# Patient Record
Sex: Male | Born: 1955 | Race: Black or African American | Hispanic: No | Smoking: Former smoker
Health system: Southern US, Community
[De-identification: ages and names within clinical notes are randomized; demographics above are authoritative.]

## PROBLEM LIST (undated history)

## (undated) DIAGNOSIS — M869 Osteomyelitis, unspecified: Secondary | ICD-10-CM

## (undated) DIAGNOSIS — I10 Essential (primary) hypertension: Secondary | ICD-10-CM

## (undated) DIAGNOSIS — E119 Type 2 diabetes mellitus without complications: Secondary | ICD-10-CM

## (undated) DIAGNOSIS — M1990 Unspecified osteoarthritis, unspecified site: Secondary | ICD-10-CM

## (undated) DIAGNOSIS — I219 Acute myocardial infarction, unspecified: Secondary | ICD-10-CM

## (undated) DIAGNOSIS — I2510 Atherosclerotic heart disease of native coronary artery without angina pectoris: Secondary | ICD-10-CM

## (undated) DIAGNOSIS — Q6600 Congenital talipes equinovarus, unspecified foot: Secondary | ICD-10-CM

## (undated) DIAGNOSIS — E785 Hyperlipidemia, unspecified: Secondary | ICD-10-CM

## (undated) DIAGNOSIS — Q6602 Congenital talipes equinovarus, left foot: Secondary | ICD-10-CM

## (undated) DIAGNOSIS — Q6601 Congenital talipes equinovarus, right foot: Secondary | ICD-10-CM

## (undated) DIAGNOSIS — J189 Pneumonia, unspecified organism: Secondary | ICD-10-CM

## (undated) HISTORY — PX: CLOSED REDUCTION NASAL FRACTURE: SUR256

## (undated) HISTORY — PX: POSTERIOR CERVICAL FUSION/FORAMINOTOMY: SHX5038

## (undated) HISTORY — PX: ANTERIOR CERVICAL DECOMP/DISCECTOMY FUSION: SHX1161

## (undated) HISTORY — DX: Congenital talipes equinovarus, unspecified foot: Q66.00

## (undated) HISTORY — PX: CARDIAC CATHETERIZATION: SHX172

## (undated) HISTORY — DX: Essential (primary) hypertension: I10

## (undated) HISTORY — DX: Osteomyelitis, unspecified: M86.9

## (undated) HISTORY — DX: Atherosclerotic heart disease of native coronary artery without angina pectoris: I25.10

## (undated) HISTORY — PX: FOOT SURGERY: SHX648

## (undated) HISTORY — DX: Hyperlipidemia, unspecified: E78.5

---
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
## 1989-01-07 HISTORY — PX: FOOT FRACTURE SURGERY: SHX645

## 1997-11-26 ENCOUNTER — Emergency Department (HOSPITAL_COMMUNITY): Admission: EM | Admit: 1997-11-26 | Discharge: 1997-11-26 | Payer: Self-pay | Admitting: Internal Medicine

## 1998-08-24 ENCOUNTER — Emergency Department (HOSPITAL_COMMUNITY): Admission: EM | Admit: 1998-08-24 | Discharge: 1998-08-24 | Payer: Self-pay | Admitting: Internal Medicine

## 1999-01-08 DIAGNOSIS — J189 Pneumonia, unspecified organism: Secondary | ICD-10-CM

## 1999-01-08 HISTORY — DX: Pneumonia, unspecified organism: J18.9

## 1999-06-09 ENCOUNTER — Encounter (INDEPENDENT_AMBULATORY_CARE_PROVIDER_SITE_OTHER): Payer: Self-pay | Admitting: Specialist

## 1999-06-09 ENCOUNTER — Ambulatory Visit (HOSPITAL_COMMUNITY): Admission: RE | Admit: 1999-06-09 | Discharge: 1999-06-09 | Payer: Self-pay | Admitting: Gastroenterology

## 1999-06-25 ENCOUNTER — Emergency Department (HOSPITAL_COMMUNITY): Admission: EM | Admit: 1999-06-25 | Discharge: 1999-06-25 | Payer: Self-pay | Admitting: *Deleted

## 2000-02-28 ENCOUNTER — Inpatient Hospital Stay (HOSPITAL_COMMUNITY): Admission: EM | Admit: 2000-02-28 | Discharge: 2000-03-01 | Payer: Self-pay

## 2000-02-28 ENCOUNTER — Encounter: Payer: Self-pay | Admitting: Emergency Medicine

## 2000-03-01 ENCOUNTER — Encounter: Payer: Self-pay | Admitting: Internal Medicine

## 2001-01-31 ENCOUNTER — Encounter: Payer: Self-pay | Admitting: Emergency Medicine

## 2001-01-31 ENCOUNTER — Emergency Department (HOSPITAL_COMMUNITY): Admission: EM | Admit: 2001-01-31 | Discharge: 2001-01-31 | Payer: Self-pay | Admitting: Emergency Medicine

## 2001-03-06 ENCOUNTER — Encounter: Payer: Self-pay | Admitting: Emergency Medicine

## 2001-03-06 ENCOUNTER — Emergency Department (HOSPITAL_COMMUNITY): Admission: EM | Admit: 2001-03-06 | Discharge: 2001-03-06 | Payer: Self-pay | Admitting: Emergency Medicine

## 2001-07-22 ENCOUNTER — Emergency Department (HOSPITAL_COMMUNITY): Admission: EM | Admit: 2001-07-22 | Discharge: 2001-07-22 | Payer: Self-pay | Admitting: Emergency Medicine

## 2001-08-03 ENCOUNTER — Emergency Department (HOSPITAL_COMMUNITY): Admission: EM | Admit: 2001-08-03 | Discharge: 2001-08-03 | Payer: Self-pay | Admitting: Emergency Medicine

## 2001-08-03 ENCOUNTER — Encounter: Payer: Self-pay | Admitting: Emergency Medicine

## 2001-08-14 ENCOUNTER — Emergency Department (HOSPITAL_COMMUNITY): Admission: EM | Admit: 2001-08-14 | Discharge: 2001-08-14 | Payer: Self-pay | Admitting: Emergency Medicine

## 2001-08-14 ENCOUNTER — Encounter: Payer: Self-pay | Admitting: Emergency Medicine

## 2001-10-02 ENCOUNTER — Encounter: Admission: RE | Admit: 2001-10-02 | Discharge: 2001-12-31 | Payer: Self-pay

## 2001-11-23 ENCOUNTER — Ambulatory Visit (HOSPITAL_COMMUNITY): Admission: RE | Admit: 2001-11-23 | Discharge: 2001-11-23 | Payer: Self-pay | Admitting: Neurosurgery

## 2001-11-29 ENCOUNTER — Observation Stay (HOSPITAL_COMMUNITY): Admission: RE | Admit: 2001-11-29 | Discharge: 2001-11-30 | Payer: Self-pay | Admitting: Neurosurgery

## 2002-03-14 ENCOUNTER — Encounter: Payer: Self-pay | Admitting: Emergency Medicine

## 2002-03-15 ENCOUNTER — Inpatient Hospital Stay (HOSPITAL_COMMUNITY): Admission: EM | Admit: 2002-03-15 | Discharge: 2002-03-18 | Payer: Self-pay | Admitting: Emergency Medicine

## 2002-03-16 ENCOUNTER — Encounter: Payer: Self-pay | Admitting: Gastroenterology

## 2002-04-08 ENCOUNTER — Emergency Department (HOSPITAL_COMMUNITY): Admission: EM | Admit: 2002-04-08 | Discharge: 2002-04-08 | Payer: Self-pay | Admitting: Emergency Medicine

## 2002-04-08 ENCOUNTER — Encounter: Payer: Self-pay | Admitting: Emergency Medicine

## 2002-04-26 ENCOUNTER — Encounter (INDEPENDENT_AMBULATORY_CARE_PROVIDER_SITE_OTHER): Payer: Self-pay | Admitting: Specialist

## 2002-04-26 ENCOUNTER — Ambulatory Visit (HOSPITAL_COMMUNITY): Admission: RE | Admit: 2002-04-26 | Discharge: 2002-04-26 | Payer: Self-pay | Admitting: Gastroenterology

## 2002-05-20 ENCOUNTER — Encounter: Payer: Self-pay | Admitting: Emergency Medicine

## 2002-05-20 ENCOUNTER — Emergency Department (HOSPITAL_COMMUNITY): Admission: EM | Admit: 2002-05-20 | Discharge: 2002-05-20 | Payer: Self-pay | Admitting: Emergency Medicine

## 2002-09-18 ENCOUNTER — Emergency Department (HOSPITAL_COMMUNITY): Admission: EM | Admit: 2002-09-18 | Discharge: 2002-09-18 | Payer: Self-pay | Admitting: Emergency Medicine

## 2002-10-08 ENCOUNTER — Encounter: Payer: Self-pay | Admitting: Emergency Medicine

## 2002-10-08 ENCOUNTER — Emergency Department (HOSPITAL_COMMUNITY): Admission: EM | Admit: 2002-10-08 | Discharge: 2002-10-08 | Payer: Self-pay

## 2002-11-27 ENCOUNTER — Encounter: Payer: Self-pay | Admitting: Emergency Medicine

## 2002-11-27 ENCOUNTER — Emergency Department (HOSPITAL_COMMUNITY): Admission: EM | Admit: 2002-11-27 | Discharge: 2002-11-27 | Payer: Self-pay | Admitting: Emergency Medicine

## 2003-02-03 ENCOUNTER — Emergency Department (HOSPITAL_COMMUNITY): Admission: EM | Admit: 2003-02-03 | Discharge: 2003-02-03 | Payer: Self-pay | Admitting: Emergency Medicine

## 2003-02-04 ENCOUNTER — Emergency Department (HOSPITAL_COMMUNITY): Admission: EM | Admit: 2003-02-04 | Discharge: 2003-02-04 | Payer: Self-pay | Admitting: Emergency Medicine

## 2003-02-06 ENCOUNTER — Encounter: Admission: RE | Admit: 2003-02-06 | Discharge: 2003-02-06 | Payer: Self-pay | Admitting: Orthopaedic Surgery

## 2003-02-06 ENCOUNTER — Encounter: Payer: Self-pay | Admitting: Orthopaedic Surgery

## 2003-04-09 ENCOUNTER — Inpatient Hospital Stay (HOSPITAL_COMMUNITY): Admission: RE | Admit: 2003-04-09 | Discharge: 2003-04-12 | Payer: Self-pay | Admitting: Orthopaedic Surgery

## 2003-07-27 ENCOUNTER — Emergency Department (HOSPITAL_COMMUNITY): Admission: EM | Admit: 2003-07-27 | Discharge: 2003-07-27 | Payer: Self-pay | Admitting: Emergency Medicine

## 2003-08-06 ENCOUNTER — Encounter: Admission: RE | Admit: 2003-08-06 | Discharge: 2003-08-06 | Payer: Self-pay | Admitting: Internal Medicine

## 2004-01-11 ENCOUNTER — Emergency Department (HOSPITAL_COMMUNITY): Admission: EM | Admit: 2004-01-11 | Discharge: 2004-01-11 | Payer: Self-pay | Admitting: Emergency Medicine

## 2004-03-18 ENCOUNTER — Emergency Department (HOSPITAL_COMMUNITY): Admission: EM | Admit: 2004-03-18 | Discharge: 2004-03-18 | Payer: Self-pay | Admitting: Family Medicine

## 2004-04-04 ENCOUNTER — Emergency Department (HOSPITAL_COMMUNITY): Admission: EM | Admit: 2004-04-04 | Discharge: 2004-04-04 | Payer: Self-pay | Admitting: Family Medicine

## 2004-04-19 ENCOUNTER — Emergency Department (HOSPITAL_COMMUNITY): Admission: EM | Admit: 2004-04-19 | Discharge: 2004-04-19 | Payer: Self-pay | Admitting: Family Medicine

## 2004-04-23 ENCOUNTER — Ambulatory Visit (HOSPITAL_COMMUNITY): Admission: RE | Admit: 2004-04-23 | Discharge: 2004-04-23 | Payer: Self-pay | Admitting: Orthopedic Surgery

## 2004-05-05 ENCOUNTER — Encounter: Payer: Self-pay | Admitting: Internal Medicine

## 2004-05-16 ENCOUNTER — Emergency Department (HOSPITAL_COMMUNITY): Admission: EM | Admit: 2004-05-16 | Discharge: 2004-05-16 | Payer: Self-pay | Admitting: Emergency Medicine

## 2004-06-29 ENCOUNTER — Encounter: Admission: RE | Admit: 2004-06-29 | Discharge: 2004-09-27 | Payer: Self-pay | Admitting: Family Medicine

## 2004-07-04 ENCOUNTER — Inpatient Hospital Stay (HOSPITAL_COMMUNITY): Admission: AD | Admit: 2004-07-04 | Discharge: 2004-07-08 | Payer: Self-pay | Admitting: Emergency Medicine

## 2004-07-07 ENCOUNTER — Encounter: Payer: Self-pay | Admitting: Internal Medicine

## 2004-07-08 ENCOUNTER — Encounter (INDEPENDENT_AMBULATORY_CARE_PROVIDER_SITE_OTHER): Payer: Self-pay | Admitting: Cardiology

## 2004-08-09 ENCOUNTER — Ambulatory Visit (HOSPITAL_COMMUNITY): Admission: RE | Admit: 2004-08-09 | Discharge: 2004-08-09 | Payer: Self-pay | Admitting: Cardiology

## 2004-08-18 ENCOUNTER — Ambulatory Visit: Payer: Self-pay | Admitting: Physical Medicine & Rehabilitation

## 2004-08-18 ENCOUNTER — Inpatient Hospital Stay (HOSPITAL_COMMUNITY): Admission: RE | Admit: 2004-08-18 | Discharge: 2004-08-21 | Payer: Self-pay | Admitting: Orthopedic Surgery

## 2004-08-22 ENCOUNTER — Emergency Department (HOSPITAL_COMMUNITY): Admission: EM | Admit: 2004-08-22 | Discharge: 2004-08-22 | Payer: Self-pay | Admitting: Emergency Medicine

## 2004-08-23 ENCOUNTER — Emergency Department (HOSPITAL_COMMUNITY): Admission: EM | Admit: 2004-08-23 | Discharge: 2004-08-23 | Payer: Self-pay | Admitting: Emergency Medicine

## 2004-12-12 ENCOUNTER — Emergency Department (HOSPITAL_COMMUNITY): Admission: EM | Admit: 2004-12-12 | Discharge: 2004-12-12 | Payer: Self-pay | Admitting: Emergency Medicine

## 2005-05-13 ENCOUNTER — Emergency Department (HOSPITAL_COMMUNITY): Admission: EM | Admit: 2005-05-13 | Discharge: 2005-05-13 | Payer: Self-pay | Admitting: Emergency Medicine

## 2005-05-14 ENCOUNTER — Emergency Department (HOSPITAL_COMMUNITY): Admission: EM | Admit: 2005-05-14 | Discharge: 2005-05-15 | Payer: Self-pay | Admitting: Emergency Medicine

## 2005-07-12 ENCOUNTER — Ambulatory Visit (HOSPITAL_COMMUNITY): Admission: RE | Admit: 2005-07-12 | Discharge: 2005-07-12 | Payer: Self-pay | Admitting: Cardiology

## 2005-07-19 ENCOUNTER — Emergency Department (HOSPITAL_COMMUNITY): Admission: EM | Admit: 2005-07-19 | Discharge: 2005-07-19 | Payer: Self-pay | Admitting: Emergency Medicine

## 2005-08-17 ENCOUNTER — Inpatient Hospital Stay (HOSPITAL_COMMUNITY): Admission: RE | Admit: 2005-08-17 | Discharge: 2005-08-18 | Payer: Self-pay | Admitting: Orthopaedic Surgery

## 2005-11-28 ENCOUNTER — Emergency Department (HOSPITAL_COMMUNITY): Admission: EM | Admit: 2005-11-28 | Discharge: 2005-11-28 | Payer: Self-pay | Admitting: Family Medicine

## 2005-12-26 ENCOUNTER — Emergency Department (HOSPITAL_COMMUNITY): Admission: EM | Admit: 2005-12-26 | Discharge: 2005-12-26 | Payer: Self-pay | Admitting: Emergency Medicine

## 2006-01-27 ENCOUNTER — Emergency Department (HOSPITAL_COMMUNITY): Admission: EM | Admit: 2006-01-27 | Discharge: 2006-01-27 | Payer: Self-pay | Admitting: Family Medicine

## 2006-01-29 ENCOUNTER — Emergency Department (HOSPITAL_COMMUNITY): Admission: EM | Admit: 2006-01-29 | Discharge: 2006-01-29 | Payer: Self-pay | Admitting: Emergency Medicine

## 2006-01-30 ENCOUNTER — Emergency Department (HOSPITAL_COMMUNITY): Admission: EM | Admit: 2006-01-30 | Discharge: 2006-01-30 | Payer: Self-pay | Admitting: Emergency Medicine

## 2006-02-22 ENCOUNTER — Inpatient Hospital Stay (HOSPITAL_COMMUNITY): Admission: RE | Admit: 2006-02-22 | Discharge: 2006-02-25 | Payer: Self-pay | Admitting: Orthopedic Surgery

## 2006-04-04 ENCOUNTER — Encounter: Payer: Self-pay | Admitting: Emergency Medicine

## 2006-04-05 ENCOUNTER — Inpatient Hospital Stay (HOSPITAL_COMMUNITY): Admission: EM | Admit: 2006-04-05 | Discharge: 2006-04-17 | Payer: Self-pay | Admitting: Orthopedic Surgery

## 2006-04-25 ENCOUNTER — Ambulatory Visit: Payer: Self-pay | Admitting: Internal Medicine

## 2006-05-04 ENCOUNTER — Ambulatory Visit: Payer: Self-pay | Admitting: *Deleted

## 2006-06-02 ENCOUNTER — Ambulatory Visit: Payer: Self-pay | Admitting: Internal Medicine

## 2006-06-28 ENCOUNTER — Emergency Department (HOSPITAL_COMMUNITY): Admission: EM | Admit: 2006-06-28 | Discharge: 2006-06-28 | Payer: Self-pay | Admitting: Emergency Medicine

## 2006-07-12 ENCOUNTER — Ambulatory Visit: Payer: Self-pay | Admitting: Internal Medicine

## 2006-07-13 ENCOUNTER — Inpatient Hospital Stay (HOSPITAL_COMMUNITY): Admission: RE | Admit: 2006-07-13 | Discharge: 2006-07-18 | Payer: Self-pay | Admitting: Orthopedic Surgery

## 2006-07-19 ENCOUNTER — Emergency Department (HOSPITAL_COMMUNITY): Admission: EM | Admit: 2006-07-19 | Discharge: 2006-07-19 | Payer: Self-pay | Admitting: Emergency Medicine

## 2006-07-25 ENCOUNTER — Inpatient Hospital Stay (HOSPITAL_COMMUNITY): Admission: RE | Admit: 2006-07-25 | Discharge: 2006-07-28 | Payer: Self-pay | Admitting: Orthopedic Surgery

## 2006-08-23 ENCOUNTER — Ambulatory Visit: Payer: Self-pay | Admitting: Internal Medicine

## 2006-08-25 ENCOUNTER — Ambulatory Visit (HOSPITAL_COMMUNITY): Admission: RE | Admit: 2006-08-25 | Discharge: 2006-08-26 | Payer: Self-pay | Admitting: Orthopedic Surgery

## 2006-10-05 ENCOUNTER — Inpatient Hospital Stay (HOSPITAL_COMMUNITY): Admission: AD | Admit: 2006-10-05 | Discharge: 2006-10-09 | Payer: Self-pay | Admitting: Orthopedic Surgery

## 2006-11-09 ENCOUNTER — Ambulatory Visit (HOSPITAL_COMMUNITY): Admission: RE | Admit: 2006-11-09 | Discharge: 2006-11-10 | Payer: Self-pay | Admitting: Orthopedic Surgery

## 2006-11-19 ENCOUNTER — Emergency Department (HOSPITAL_COMMUNITY): Admission: EM | Admit: 2006-11-19 | Discharge: 2006-11-19 | Payer: Self-pay | Admitting: *Deleted

## 2007-01-05 ENCOUNTER — Emergency Department (HOSPITAL_COMMUNITY): Admission: EM | Admit: 2007-01-05 | Discharge: 2007-01-05 | Payer: Self-pay | Admitting: Emergency Medicine

## 2007-01-10 ENCOUNTER — Inpatient Hospital Stay (HOSPITAL_COMMUNITY): Admission: RE | Admit: 2007-01-10 | Discharge: 2007-01-14 | Payer: Self-pay | Admitting: Orthopedic Surgery

## 2007-01-24 ENCOUNTER — Encounter (INDEPENDENT_AMBULATORY_CARE_PROVIDER_SITE_OTHER): Payer: Self-pay | Admitting: *Deleted

## 2007-02-21 ENCOUNTER — Ambulatory Visit (HOSPITAL_COMMUNITY): Admission: RE | Admit: 2007-02-21 | Discharge: 2007-02-22 | Payer: Self-pay | Admitting: Orthopedic Surgery

## 2007-03-16 ENCOUNTER — Ambulatory Visit: Payer: Self-pay | Admitting: Internal Medicine

## 2007-03-17 ENCOUNTER — Encounter (INDEPENDENT_AMBULATORY_CARE_PROVIDER_SITE_OTHER): Payer: Self-pay | Admitting: Internal Medicine

## 2007-03-24 ENCOUNTER — Emergency Department (HOSPITAL_COMMUNITY): Admission: EM | Admit: 2007-03-24 | Discharge: 2007-03-24 | Payer: Self-pay | Admitting: Family Medicine

## 2007-03-27 ENCOUNTER — Emergency Department (HOSPITAL_COMMUNITY): Admission: EM | Admit: 2007-03-27 | Discharge: 2007-03-27 | Payer: Self-pay | Admitting: Emergency Medicine

## 2007-03-29 ENCOUNTER — Ambulatory Visit (HOSPITAL_COMMUNITY): Admission: RE | Admit: 2007-03-29 | Discharge: 2007-03-30 | Payer: Self-pay | Admitting: Orthopedic Surgery

## 2007-05-17 ENCOUNTER — Ambulatory Visit (HOSPITAL_COMMUNITY): Admission: RE | Admit: 2007-05-17 | Discharge: 2007-05-18 | Payer: Self-pay | Admitting: Orthopedic Surgery

## 2007-05-24 ENCOUNTER — Encounter: Admission: RE | Admit: 2007-05-24 | Discharge: 2007-05-24 | Payer: Self-pay | Admitting: Orthopaedic Surgery

## 2007-06-07 ENCOUNTER — Encounter: Admission: RE | Admit: 2007-06-07 | Discharge: 2007-06-07 | Payer: Self-pay | Admitting: Orthopaedic Surgery

## 2007-06-27 ENCOUNTER — Encounter: Payer: Self-pay | Admitting: Internal Medicine

## 2007-07-04 ENCOUNTER — Ambulatory Visit: Payer: Self-pay | Admitting: Internal Medicine

## 2007-07-17 ENCOUNTER — Ambulatory Visit (HOSPITAL_COMMUNITY): Admission: RE | Admit: 2007-07-17 | Discharge: 2007-07-18 | Payer: Self-pay | Admitting: Orthopedic Surgery

## 2007-08-22 ENCOUNTER — Ambulatory Visit (HOSPITAL_COMMUNITY): Admission: RE | Admit: 2007-08-22 | Discharge: 2007-08-23 | Payer: Self-pay | Admitting: Orthopedic Surgery

## 2007-09-20 ENCOUNTER — Ambulatory Visit: Payer: Self-pay | Admitting: Internal Medicine

## 2007-10-10 ENCOUNTER — Ambulatory Visit (HOSPITAL_COMMUNITY): Admission: RE | Admit: 2007-10-10 | Discharge: 2007-10-11 | Payer: Self-pay | Admitting: Orthopedic Surgery

## 2007-11-17 ENCOUNTER — Emergency Department (HOSPITAL_COMMUNITY): Admission: EM | Admit: 2007-11-17 | Discharge: 2007-11-17 | Payer: Self-pay | Admitting: Emergency Medicine

## 2008-09-13 ENCOUNTER — Emergency Department (HOSPITAL_COMMUNITY): Admission: EM | Admit: 2008-09-13 | Discharge: 2008-09-13 | Payer: Self-pay | Admitting: Emergency Medicine

## 2008-10-04 ENCOUNTER — Emergency Department (HOSPITAL_COMMUNITY): Admission: EM | Admit: 2008-10-04 | Discharge: 2008-10-05 | Payer: Self-pay | Admitting: Emergency Medicine

## 2008-12-11 ENCOUNTER — Ambulatory Visit (HOSPITAL_COMMUNITY): Admission: RE | Admit: 2008-12-11 | Discharge: 2008-12-12 | Payer: Self-pay | Admitting: Orthopedic Surgery

## 2008-12-28 ENCOUNTER — Emergency Department (HOSPITAL_COMMUNITY): Admission: EM | Admit: 2008-12-28 | Discharge: 2008-12-29 | Payer: Self-pay | Admitting: Emergency Medicine

## 2009-01-14 ENCOUNTER — Encounter: Payer: Self-pay | Admitting: Internal Medicine

## 2009-01-14 ENCOUNTER — Ambulatory Visit: Payer: Self-pay | Admitting: Infectious Diseases

## 2009-01-14 DIAGNOSIS — M109 Gout, unspecified: Secondary | ICD-10-CM | POA: Insufficient documentation

## 2009-01-14 DIAGNOSIS — I10 Essential (primary) hypertension: Secondary | ICD-10-CM | POA: Insufficient documentation

## 2009-01-14 DIAGNOSIS — E785 Hyperlipidemia, unspecified: Secondary | ICD-10-CM | POA: Insufficient documentation

## 2009-01-14 LAB — CONVERTED CEMR LAB
ALT: 21 units/L (ref 0–53)
Albumin: 4 g/dL (ref 3.5–5.2)
BUN: 10 mg/dL (ref 6–23)
Chloride: 107 meq/L (ref 96–112)
HDL: 34 mg/dL — ABNORMAL LOW (ref >39)
Hgb A1c MFr Bld: 6.5 %
Potassium: 4.3 meq/L (ref 3.5–5.3)
VLDL: 43 mg/dL — ABNORMAL HIGH (ref 0–40)

## 2009-01-22 ENCOUNTER — Ambulatory Visit: Payer: Self-pay | Admitting: Internal Medicine

## 2009-02-16 ENCOUNTER — Ambulatory Visit: Payer: Self-pay | Admitting: Internal Medicine

## 2009-02-16 ENCOUNTER — Encounter: Payer: Self-pay | Admitting: Internal Medicine

## 2009-02-16 DIAGNOSIS — F528 Other sexual dysfunction not due to a substance or known physiological condition: Secondary | ICD-10-CM | POA: Insufficient documentation

## 2009-02-16 LAB — CONVERTED CEMR LAB
HCT: 39 % (ref 39.0–52.0)
MCHC: 30.5 g/dL (ref 30.0–36.0)
MCV: 69.1 fL — ABNORMAL LOW (ref >=78.0)
Platelets: 312 10*3/uL (ref 150–400)
Prolactin: 9.2 ng/mL (ref 2.1–17.1)
RBC: 5.64 M/uL (ref 4.22–5.81)
Testosterone: 190.2 ng/dL — ABNORMAL LOW (ref 350–890)
WBC: 7.7 10*3/uL (ref 4.0–10.5)

## 2009-03-07 ENCOUNTER — Encounter: Payer: Self-pay | Admitting: Internal Medicine

## 2009-03-19 ENCOUNTER — Ambulatory Visit: Payer: Self-pay | Admitting: Internal Medicine

## 2009-03-19 LAB — CONVERTED CEMR LAB
AST: 22 units/L (ref 0–37)
BUN: 15 mg/dL (ref 6–23)
CO2: 20 meq/L (ref 19–32)
Glucose, Bld: 80 mg/dL (ref 70–99)
Sodium: 143 meq/L (ref 135–145)
Total Protein: 7.6 g/dL (ref 6.0–8.3)

## 2009-03-20 LAB — CONVERTED CEMR LAB
Cholesterol: 223 mg/dL — ABNORMAL HIGH (ref 0–200)
HDL: 35 mg/dL — ABNORMAL LOW (ref >39)
VLDL: 25 mg/dL (ref 0–40)

## 2009-05-09 DIAGNOSIS — E119 Type 2 diabetes mellitus without complications: Secondary | ICD-10-CM

## 2009-05-09 HISTORY — DX: Type 2 diabetes mellitus without complications: E11.9

## 2009-06-14 ENCOUNTER — Emergency Department (HOSPITAL_COMMUNITY): Admission: EM | Admit: 2009-06-14 | Discharge: 2009-06-14 | Payer: Self-pay | Admitting: Family Medicine

## 2009-07-07 HISTORY — PX: CARDIAC CATHETERIZATION: SHX172

## 2009-07-21 ENCOUNTER — Ambulatory Visit: Payer: Self-pay | Admitting: Infectious Diseases

## 2009-07-21 ENCOUNTER — Encounter: Payer: Self-pay | Admitting: Licensed Clinical Social Worker

## 2009-07-21 ENCOUNTER — Telehealth: Payer: Self-pay | Admitting: Licensed Clinical Social Worker

## 2009-07-31 ENCOUNTER — Inpatient Hospital Stay (HOSPITAL_COMMUNITY): Admission: AD | Admit: 2009-07-31 | Discharge: 2009-08-05 | Payer: Self-pay | Admitting: Internal Medicine

## 2009-07-31 ENCOUNTER — Ambulatory Visit: Payer: Self-pay | Admitting: Internal Medicine

## 2009-07-31 ENCOUNTER — Encounter: Payer: Self-pay | Admitting: Internal Medicine

## 2009-08-05 LAB — PULMONARY FUNCTION TEST

## 2009-08-10 ENCOUNTER — Ambulatory Visit: Payer: Self-pay | Admitting: Internal Medicine

## 2009-08-10 LAB — CONVERTED CEMR LAB

## 2009-09-06 DIAGNOSIS — E119 Type 2 diabetes mellitus without complications: Secondary | ICD-10-CM

## 2009-09-06 DIAGNOSIS — E1165 Type 2 diabetes mellitus with hyperglycemia: Secondary | ICD-10-CM | POA: Insufficient documentation

## 2009-09-06 DIAGNOSIS — E0865 Diabetes mellitus due to underlying condition with hyperglycemia: Secondary | ICD-10-CM | POA: Insufficient documentation

## 2009-11-24 ENCOUNTER — Encounter: Payer: Self-pay | Admitting: Internal Medicine

## 2009-11-30 ENCOUNTER — Ambulatory Visit: Payer: Self-pay | Admitting: Internal Medicine

## 2009-11-30 LAB — CONVERTED CEMR LAB: Hgb A1c MFr Bld: 6.8 %

## 2009-12-01 LAB — CONVERTED CEMR LAB
ALT: 20 units/L (ref 0–53)
Albumin: 4 g/dL (ref 3.5–5.2)
CO2: 23 meq/L (ref 19–32)
Calcium: 9 mg/dL (ref 8.4–10.5)
Chloride: 105 meq/L (ref 96–112)
Cholesterol: 216 mg/dL — ABNORMAL HIGH (ref 0–200)
Creatinine, Ser: 0.97 mg/dL (ref 0.40–1.50)
Microalb Creat Ratio: 3.6 mg/g (ref 0.0–30.0)
Potassium: 4.4 meq/L (ref 3.5–5.3)
Total CHOL/HDL Ratio: 6.8
Total Protein: 7.1 g/dL (ref 6.0–8.3)

## 2009-12-07 ENCOUNTER — Ambulatory Visit: Payer: Self-pay | Admitting: Internal Medicine

## 2009-12-07 ENCOUNTER — Telehealth: Payer: Self-pay | Admitting: Internal Medicine

## 2009-12-07 LAB — CONVERTED CEMR LAB
Blood Glucose, Fingerstick: 110
CO2: 24 meq/L (ref 19–32)
Chloride: 103 meq/L (ref 96–112)
Glucose, Bld: 99 mg/dL (ref 70–99)
Potassium: 4.6 meq/L (ref 3.5–5.3)
Sodium: 139 meq/L (ref 135–145)

## 2009-12-30 ENCOUNTER — Emergency Department (HOSPITAL_COMMUNITY): Admission: EM | Admit: 2009-12-30 | Discharge: 2009-12-30 | Payer: Self-pay | Admitting: Emergency Medicine

## 2010-02-24 ENCOUNTER — Ambulatory Visit: Payer: Self-pay | Admitting: Internal Medicine

## 2010-04-08 ENCOUNTER — Emergency Department (HOSPITAL_COMMUNITY)
Admission: EM | Admit: 2010-04-08 | Discharge: 2010-04-08 | Payer: Self-pay | Source: Home / Self Care | Admitting: Family Medicine

## 2010-04-26 ENCOUNTER — Telehealth (INDEPENDENT_AMBULATORY_CARE_PROVIDER_SITE_OTHER): Payer: Self-pay | Admitting: *Deleted

## 2010-04-26 ENCOUNTER — Ambulatory Visit: Payer: Self-pay | Admitting: Internal Medicine

## 2010-04-28 ENCOUNTER — Encounter: Payer: Self-pay | Admitting: Internal Medicine

## 2010-04-28 ENCOUNTER — Ambulatory Visit (HOSPITAL_COMMUNITY)
Admission: RE | Admit: 2010-04-28 | Discharge: 2010-04-28 | Payer: Self-pay | Source: Home / Self Care | Attending: Internal Medicine | Admitting: Internal Medicine

## 2010-05-30 ENCOUNTER — Encounter: Payer: Self-pay | Admitting: Cardiology

## 2010-06-08 NOTE — Assessment & Plan Note (Signed)
Summary: EST-3 MONTH CHECKUP/CH   Vital Signs:  Patient profile:   55 year old male Height:      66 inches (167.64 cm) Weight:      225.5 pounds (102.50 kg) BMI:     36.53 Temp:     97.8 degrees F (36.56 degrees C) oral Pulse rate:   83 / minute BP sitting:   167 / 96  (left arm) Cuff size:   large  Vitals Entered By: Cynda Familia Duncan Dull) (November 30, 2009 8:57 AM) CC: f/u,  med refill Is Patient Diabetic? Yes Did you bring your meter with you today? No Pain Assessment Patient in pain? no      Nutritional Status BMI of > 30 = obese  Have you ever been in a relationship where you felt threatened, hurt or afraid?No   Does patient need assistance? Functional Status Self care Ambulation Normal   Primary Care Provider:  Lars Mage MD  CC:  f/u and med refill.  History of Present Illness: 55 yo male with HLD, HTN, CAD (NSTEMI in 2006), newly diagnosed diabetes with Hba1c 7.2, recurrent osteomyelitis presents to [facility] for regular follow up.  He was recently admitted for SOB and chest pain. His cardiac cath showed nonobstructive coronary disease with 20% to 30% obtuse marginal, normal left ventricular function. His chest pain was determined to be noncardiac  and possibly related to GERD.   HTN: He is out of all his meds since last 2 weeks or so.  HLD: Not taking his meds as he is out of it.  Osteomylitis: Is not taking Cipro and Doxy since he has been out of it and will see Dr Lajoyce Corners tomorrow for evaluation of his feet.  Diabetes: Patient's last HBa1c was 7.2 while hospitalization, his fasting BG today is 121 and [name] tells me that Medicare does not accept Hba1c as diagnositic criteria for Diabetes. I will get an Optha exam done and will change his metformin to 1000 two times a day. Get UA for microalb/crt ratio.   He denies any new sicknesses or hospitalizations, no chest pain episodes, no fevers, no chills, no abdominal or urinary concerns. No recent changes  in appetite, weight.   Preventive Screening-Counseling & Management  Alcohol-Tobacco     Alcohol drinks/day: 0     Smoking Status: quit     Year Quit: 2008  Problems Prior to Update: 1)  Dyspnea  (ICD-786.05) 2)  Diabetes Mellitus, Borderline  (ICD-790.29) 3)  Coronary Artery Disease  (ICD-414.00) 4)  Erectile Dysfunction, Non-organic  (ICD-302.72) 5)  Leg Pain, Left  (ICD-729.5) 6)  Hypertension, Benign Essential  (ICD-401.1) 7)  Hyperlipidemia  (ICD-272.4) 8)  Osteomyelitis, Chronic, Unspecified Site  (ICD-730.10) 9)  Gout  (ICD-274.9)  Medications Prior to Update: 1)  Pravastatin Sodium 40 Mg Tabs (Pravastatin Sodium) .... Take 1 Tablet By Mouth Once A Day 2)  Lisinopril-Hydrochlorothiazide 20-25 Mg Tabs (Lisinopril-Hydrochlorothiazide) .... Take 1 Tablet By Mouth Once A Day 3)  Doxycycline Hyclate 100 Mg Tabs (Doxycycline Hyclate) .... Take 1 Tablet By Mouth Once A Day 4)  Ciprofloxacin Hcl 500 Mg Tabs (Ciprofloxacin Hcl) .... Take 1 Tablet By Mouth Two Times A Day 5)  Aspirin 81 Mg Chew (Aspirin) .... Take 1 Tablet By Mouth Once A Day 6)  Ativan 0.5 Mg Tabs (Lorazepam) .... Take 1 Tablet Once Daily As Needed For Anxiety 7)  Metformin Hcl 500 Mg Tabs (Metformin Hcl) .... Take 1 Tablet By Mouth Once A Day 8)  Antivert 25 Mg Tabs (Meclizine Hcl) .... Take 2 Tablets By Mouth Every Day  Current Medications (verified): 1)  Pravastatin Sodium 40 Mg Tabs (Pravastatin Sodium) .... Take 1 Tablet By Mouth Once A Day 2)  Lisinopril-Hydrochlorothiazide 20-25 Mg Tabs (Lisinopril-Hydrochlorothiazide) .... Take 1 Tablet By Mouth Once A Day 3)  Aspirin 81 Mg Chew (Aspirin) .... Take 1 Tablet By Mouth Once A Day 4)  Ativan 0.5 Mg Tabs (Lorazepam) .... Take 1 Tablet Once Daily As Needed For Anxiety 5)  Metformin Hcl 1000 Mg Tabs (Metformin Hcl) .... Take 1 Tablet By Mouth Two Times A Day  Allergies: 1)  ! Morphine  Past History:  Past Medical History: Last updated: 01/14/2009 Coronary  artery disease Gout Hyperlipidemia Hypertension osteomylitis CTEV(club foot)  Family History: Last updated: 01/14/2009 mother20 year old with diabetes and CAD with pacemaker.  Social History: Last updated: 01/22/2009 Former Smoker Alcohol use-yes Regular exercise-no  Risk Factors: Alcohol Use: 0 (11/30/2009) Exercise: no (08/10/2009)  Risk Factors: Smoking Status: quit (11/30/2009)  Family History: Reviewed history from 01/14/2009 and no changes required. mother20 year old with diabetes and CAD with pacemaker.  Social History: Reviewed history from 01/22/2009 and no changes required. Former Smoker Alcohol use-yes Regular exercise-no  Review of Systems      See HPI  Physical Exam  Additional Exam:  Gen: AOx3, in no acute distress Eyes: PERRL, EOMI ENT:MMM, No erythema noted in posterior pharynx Neck: No JVD, No LAP Chest: CTAB with  good respiratory effort CVS: regular rhythmic rate, NO M/R/G, S1 S2 normal Abdo: soft,ND, BS+x4, Non tender and No hepatosplenomegaly EXT: No odema noted, left foot has small healing ulcer on the planter aspect which is healing Neuro: Non focal, gait is normal Skin: no rashes noted.    Impression & Recommendations:  Problem # 1:  DIAB W/O COMP TYPE II/UNS NOT STATED UNCNTRL (ICD-250.00) Assessment New  Optha referral. Metformin 1000 two times a day  Weight loss and diet managment>referral to [employer] and pamphlet to lifestyle class. Already on Lisinopril but will check Urine microalb ratio. Foot exam: sensations to light touch +, pulses diminished.  His updated medication list for this problem includes:    Lisinopril-hydrochlorothiazide 20-25 Mg Tabs (Lisinopril-hydrochlorothiazide) .Marland Kitchen... Take 1 tablet by mouth once a day    Aspirin 81 Mg Chew (Aspirin) .Marland Kitchen... Take 1 tablet by mouth once a day    Metformin Hcl 1000 Mg Tabs (Metformin hcl) .Marland Kitchen... Take 1 tablet by mouth two times a day  Orders: Ophthalmology Referral  (Ophthalmology) T-Urine Microalbumin w/creat. ratio [phone]) Nutrition Referral (Nutrition)  Labs Reviewed: Creat: 1.17 (03/19/2009)    Reviewed HgBA1c results: 6.8 (11/30/2009)  6.5 (01/14/2009)  Problem # 2:  HYPERTENSION, BENIGN ESSENTIAL (ICD-401.1) Assessment: Deteriorated Not taking Anti HTN since >10 days. Will restart today.  His updated medication list for this problem includes:    Lisinopril-hydrochlorothiazide 20-25 Mg Tabs (Lisinopril-hydrochlorothiazide) .Marland Kitchen... Take 1 tablet by mouth once a day  BP today: 167/96 Prior BP: 127/80 (08/10/2009)  Labs Reviewed: K+: 4.4 (03/19/2009) Creat: : 1.17 (03/19/2009)   Chol: 223 (03/19/2009)   HDL: 35 (03/19/2009)   LDL: 163 (03/19/2009)   TG: 127 (03/19/2009)  Problem # 3:  CORONARY ARTERY DISEASE (ICD-414.00) Assessment: Improved No chest pain/Shortness of breath reported. His updated medication list for this problem includes:    Lisinopril-hydrochlorothiazide 20-25 Mg Tabs (Lisinopril-hydrochlorothiazide) .Marland Kitchen... Take 1 tablet by mouth once a day    Aspirin 81 Mg Chew (Aspirin) .Marland Kitchen... Take 1 tablet by mouth once  a day  Labs Reviewed: Chol: 223 (03/19/2009)   HDL: 35 (03/19/2009)   LDL: 163 (03/19/2009)   TG: 127 (03/19/2009)  Problem # 4:  HYPERLIPIDEMIA (ICD-272.4) Assessment: Unchanged Check his Lipid panel today.  His updated medication list for this problem includes:    Pravastatin Sodium 40 Mg Tabs (Pravastatin sodium) .Marland Kitchen... Take 1 tablet by mouth once a day  Orders: T-Lipid Profile [phone])  Labs Reviewed: SGOT: 22 (03/19/2009)   SGPT: 21 (03/19/2009)   HDL:35 (03/19/2009), 34 (01/14/2009)  LDL:163 (03/19/2009), 141 (01/14/2009)  Chol:223 (03/19/2009), 218 (01/14/2009)  Trig:127 (03/19/2009), 213 (01/14/2009)  Problem # 5:  OSTEOMYELITIS, CHRONIC, UNSPECIFIED SITE (ICD-730.10) Assessment: Improved Patient to see Dr Lajoyce Corners tomorrow. He has been off his meds for last 2 weeks now and the wound does not  look infected at this time. I will follow up on Dr Audrie Lia note and plan in this regard as to how to manage his osteolylitis from here onwards.  Problem # 6:  Preventive Health Care (ICD-V70.0) Assessment: Comment Only Pneumovax given today.  Reviewed preventive care protocols, scheduled due services, and updated immunizations.  Complete Medication List: 1)  Pravastatin Sodium 40 Mg Tabs (Pravastatin sodium) .... Take 1 tablet by mouth once a day 2)  Lisinopril-hydrochlorothiazide 20-25 Mg Tabs (Lisinopril-hydrochlorothiazide) .... Take 1 tablet by mouth once a day 3)  Aspirin 81 Mg Chew (Aspirin) .... Take 1 tablet by mouth once a day 4)  Ativan 0.5 Mg Tabs (Lorazepam) .... Take 1 tablet once daily as needed for anxiety 5)  Metformin Hcl 1000 Mg Tabs (Metformin hcl) .... Take 1 tablet by mouth two times a day  Other Orders: T-Hgb A1C (in-house) [id number]) T- Capillary Blood Glucose (47829) T-Comprehensive Metabolic Panel ([id number]) Pneumococcal Vaccine (84696) Admin 1st Vaccine (29528)  Patient Instructions: 1)  Please schedule a follow-up appointment in 3 months. 2)  Limit your Sodium (Salt). 3)  You need to lose weight. Consider a lower calorie diet and regular exercise.  4)  Take an Aspirin every day. 5)  Check your blood sugars regularly. If your readings are usually above :200 or below 70 you should contact our office. 6)  It is important that your Diabetic A1c level is checked every 3 months. 7)  See your eye doctor yearly to check for diabetic eye damage. 8)  Check your feet each night for sore areas, calluses or signs of infection. 9)  Check your Blood Pressure regularly. If it is above: 160/100 you should make an appointment. Prescriptions: ATIVAN 0.5 MG TABS (LORAZEPAM) take 1 tablet once daily as needed for anxiety  #30 x 0   Entered and Authorized by:   Lars Mage MD   Signed by:   Lars Mage MD on 11/30/2009   Method used:   Print then Give to Patient   RxID:    [id number] ASPIRIN 81 MG CHEW (ASPIRIN) Take 1 tablet by mouth once a day  #30 x 11   Entered and Authorized by:   Lars Mage MD   Signed by:   Lars Mage MD on 11/30/2009   Method used:   Print then Give to Patient   RxID:   [id number] LISINOPRIL-HYDROCHLOROTHIAZIDE 20-25 MG TABS (LISINOPRIL-HYDROCHLOROTHIAZIDE) Take 1 tablet by mouth once a day  #31 x 11   Entered and Authorized by:   Lars Mage MD   Signed by:   Lars Mage MD on 11/30/2009   Method used:   Print then Give to Patient   RxID:   [id number] PRAVASTATIN  SODIUM 40 MG TABS (PRAVASTATIN SODIUM) Take 1 tablet by mouth once a day  #30 x 11   Entered and Authorized by:   Lars Mage MD   Signed by:   Lars Mage MD on 11/30/2009   Method used:   Print then Give to Patient   RxID:   [id number] METFORMIN HCL 1000 MG TABS (METFORMIN HCL) Take 1 tablet by mouth two times a day  #62 x 11   Entered and Authorized by:   Lars Mage MD   Signed by:   Lars Mage MD on 11/30/2009   Method used:   Print then Give to Patient   RxID:   [id number]  Process Orders Check Orders Results:     [facility]: Check successful Order queued for requisitioning for [facility]: November 30, 2009 11:56 AM  Tests Sent for requisitioning (November 30, 2009 11:56 AM):     11/30/2009: [facility] -- T-Urine Microalbumin w/creat. ratio [[id number]] (signed)     11/30/2009: [facility] -- T-Lipid Profile [phone] (signed)     11/30/2009: [facility] -- T-Comprehensive Metabolic Panel [phone] (signed)    Prevention & Chronic Care Immunizations   Influenza vaccine: Fluvax 3+  (02/16/2009)   Influenza vaccine deferral: Not indicated  (08/10/2009)    Tetanus booster: Not documented   Td booster deferral: Deferred  (11/30/2009)    Pneumococcal vaccine: Pneumovax  (11/30/2009)  Colorectal Screening   Hemoccult: Not documented   Hemoccult action/deferral:  Refused  (08/10/2009)    Colonoscopy: Not documented   Colonoscopy action/deferral: Deferred  (11/30/2009)  Other Screening   PSA: Not documented   PSA action/deferral: Not indicated  (08/10/2009)   Smoking status: quit  (11/30/2009)  Diabetes Mellitus   HgbA1C: 6.8  (11/30/2009)    Eye exam: Not documented   Diabetic eye exam action/deferral: Ophthalmology referral  (11/30/2009)    Foot exam: Not documented   High risk foot: Not documented   Foot care education: Not documented    Urine microalbumin/creatinine ratio: Not documented   Urine microalbumin action/deferral: Ordered    Diabetes flowsheet reviewed?: Yes   Progress toward A1C goal: Unchanged  Lipids   Total Cholesterol: 223  (03/19/2009)   Lipid panel action/deferral: Lipid Panel ordered   LDL: 163  (03/19/2009)   LDL Direct: Not documented   HDL: 35  (03/19/2009)   Triglycerides: 127  (03/19/2009)    SGOT (AST): 22  (03/19/2009)   BMP action: Ordered   SGPT (ALT): 21  (03/19/2009) CMP ordered    Alkaline phosphatase: 126  (03/19/2009)   Total bilirubin: 0.4  (03/19/2009)    Lipid flowsheet reviewed?: Yes   Progress toward LDL goal: Unchanged  Hypertension   Last Blood Pressure: 167 / 96  (11/30/2009)   Serum creatinine: 1.17  (03/19/2009)   BMP action: Ordered   Serum potassium 4.4  (03/19/2009) CMP ordered     Hypertension flowsheet reviewed?: Yes   Progress toward BP goal: Deteriorated  Self-Management Support :   Personal Goals (by the next clinic visit) :      Personal blood pressure goal: 140/90  (01/22/2009)     Personal LDL goal: 100  (01/22/2009)    Patient will work on the following items until the next clinic visit to reach self-care goals:     Medications and monitoring: take my medicines every day, check my blood pressure, bring all of my medications to every visit, weigh myself weekly  (11/30/2009)     Eating: eat more  vegetables, eat foods that are low in salt, eat baked foods  instead of fried foods  (11/30/2009)     Activity: take a 30 minute walk every day  (11/30/2009)     Other: having hard time cutting back on salt and no exercise due to multiple surgeries on left foot and gout in right foot  (07/21/2009)    Diabetes self-management support: Resources for patients handout, Written self-care plan, Referred for DM self-management training  (11/30/2009)   Diabetes care plan printed   Referred.    Hypertension self-management support: Resources for patients handout, Written self-care plan  (11/30/2009)   Hypertension self-care plan printed.    Lipid self-management support: Resources for patients handout, Written self-care plan  (11/30/2009)   Lipid self-care plan printed.      Resource handout printed.   Nursing Instructions: Refer for screening diabetic eye exam (see order) Refer for diabetes self-management training (see order)    Diabetes Self Management Training Referral Patient Name: Daniel Perkins Date Of Birth: [birth year] MRN: [id number] Current Diagnosis:  DYSPNEA (ICD-786.05) DIAB W/O COMP TYPE II/UNS NOT STATED UNCNTRL (ICD-250.00) CORONARY ARTERY DISEASE (ICD-414.00) ERECTILE DYSFUNCTION, NON-ORGANIC (ICD-302.72) LEG PAIN, LEFT (ICD-729.5) HYPERTENSION, BENIGN ESSENTIAL (ICD-401.1) HYPERLIPIDEMIA (ICD-272.4) OSTEOMYELITIS, CHRONIC, UNSPECIFIED SITE (ICD-730.10) GOUT (ICD-274.9)     Management Training Needs:   Initial Diabetes Self management Training   Initial Medical Nutrition Therapy(3 hours/1st year)  Complicating Conditions:  HTN  Dyslipidemia   Laboratory Results   Blood Tests   Date/Time Received: November 30, 2009 9:25 AM Date/Time Reported: Alric Quan  November 30, 2009 9:25 AM  HGBA1C: 6.8%   (Normal Range: Non-Diabetic - 3-6%   Control Diabetic - 6-8%) CBG Fasting:: 121mg /dL  Comments: Patient stated that he has been out of his medication for several days.  He does not use a meter to check cbg at home.  Alric Quan  November 30, 2009 9:26 AM     Process Orders Check Orders Results:     [facility]: Check successful Order queued for requisitioning for [facility]: November 30, 2009 11:56 AM  Tests Sent for requisitioning (November 30, 2009 11:56 AM):     11/30/2009: [facility] -- T-Urine Microalbumin w/creat. ratio [[id number]] (signed)     11/30/2009: [facility] -- T-Lipid Profile [phone] (signed)     11/30/2009: [facility] -- T-Comprehensive Metabolic Panel [phone] (signed)       Immunizations Administered:  Pneumonia Vaccine:    Vaccine Type: Pneumovax    Site: left deltoid    Mfr: Merck    Dose: 0.5 ml    Route: IM    Given by: Cynda Familia ([facility])    Exp. Date: 05/26/2011    Lot #: [id number]    VIS given: 12/05/95 version given November 30, 2009.  Appended Document: EST-3 MONTH CHECKUP/CH Patient's Hba1c came back and it is actually at goal. I discussed the case with Dr Meredith Pel and we agreed not to raise his Metformin at this time. I will make that change in the EMR and call the patient with this information.   Clinical Lists Changes  Medications: Changed medication from METFORMIN HCL 1000 MG TABS (METFORMIN HCL) Take 1 tablet by mouth two times a day to METFORMIN HCL 500 MG TABS (METFORMIN HCL) Take 1 tablet by mouth two times a day

## 2010-06-08 NOTE — Miscellaneous (Signed)
Summary: history and physical admission  Clinical Lists Changes  INTERNAL MEDICINE ADMISSION HISTORY AND PHYSICAL PCP: Clinic CC: Chest pain and SOB HPI: 55 yo african american past history with a history of NSTEMI in 2006, gout, hyperlipidemia, HTN and recurrent osteomyelitis presents with a 3 weeks history of SOB and chest pain. Patient states that he cannot walk more than 10-20 meters with out getting SOB. His SOB is completely relieved by rest after just one to five minutes. He denies any cough or wheeze or any fever but does endorse subjective chills. Associated with this SOB is chest pain which the patient describes as burning and pressure that goes away with of rest. He states the pain is substernal and does not radiate to the jaw or arm. He does endorse nausea with some of the episodes of chest pain. He rates the pain as an 8 out of 10 and completely relieved by rest. The pain not positional and did not come in association with aviral prodrome. He denies any unilteral leg swelling or hemoptysis or pleurisy. He denies any syncopal episodes but does endorse being lightheaded at times. He also endorses marked fatigue and generalized weakness. ROS: No weight changes CV: as per HPI no edema Resp: no cough GI: Nausea and one episode on bilious emesis, no blood in stool. Derm: no rash Neuro: no focal weakness, no HA GU: no difficulty with urination, no hematuria  ALLERGIES: morphine--itching  PAST MEDICAL HISTORY: Past Medical History: Coronary artery disease Gout Hyperlipidemia Hypertension osteomylitis, chronic on chronic abx therapy.  CTEV(club foot)    MEDICATIONS: Current Meds:  PRAVASTATIN SODIUM 40 MG TABS (PRAVASTATIN SODIUM) Take 1 tablet by mouth once a day LISINOPRIL-HYDROCHLOROTHIAZIDE 20-25 MG TABS (LISINOPRIL-HYDROCHLOROTHIAZIDE) Take 1 tablet by mouth once a day DOXYCYCLINE HYCLATE 100 MG TABS (DOXYCYCLINE HYCLATE) Take 1 tablet by mouth once a day ASPIRIN  81 MG CHEW (ASPIRIN) Take 1 tablet by mouth once a day    SOCIAL HISTORY: Social History: Former Smoker for 15 years 1ppd quit 2 years ago Alcohol use-no Regular exercise-no No IVDU    FAMILY HISTORY Family History: mother15 year old with diabetes and CAD with pacemaker. Father with DMII, brother MI at 83  VITALS: T: 87.8  P: 94  BP: 122/78  R: 20  O2SAT: 92%  ON: RA PHYSICAL EXAM: General: NAD, cooperative to examination HEENT: atraumatic, PERRLA, EOMI, no scleral icterus, no oropharyngeal erythema, good dental care, no postnasal drip Neck: supple, full ROM, no thyroid enlargement Resp: CTAB, good air movement, no wheezing, no ronchi   CVS: RRR, S1/S2, no murmurs, no gallops, no rubs, no JVD ABD: soft, NT/ND, BS +, no rebound, no guarding SKIN: good skin turgor, no rashes EXT: no edema, no cyanosis, good DP and PT pulses bilateraly MSK: no joint effusions, no tenderness, full ROM in all extremities NEURO: A & Ox3, strength 5/5 in all 4 extremities and equal, sensation intact to soft touch and position, normal f-to-nose, normal h-to-sheen, Romberg -, no ataxia, no asterixis PSYCH: appropriate  Last ECHO was march 2006 demonstrated 55-65% Cardiac Cath feb 28th 2006: no critical stenoses noted EKG: unchanged from previous no evidence of acute infact or ischemia LABS:   ASSESSMENT AND PLAN: (1)Chest pain: Given significant risk factors of Hyperlipidemia and HTN and + family history, smoking history and past history of NSTEMI his chest pain is most likely cardiac in nature. Other items in the differential include pulmonary embolism and GERD. Given that patients pain is completely relieved by <22minutes  of rest and is extertional, typical angina is the most likely etiology. As such therapeutic heparin will not be started. In regards to patients SOB, this could represent a CHF exacerbation therefore we will check BNP and get daily weights and strict Is and Os.  -ASA 325 mg by mouth  qday -crestor 40mg  given -metoprolol 50 mg two times a day -heparin ppx 5000u TID -CXR -cardiac enzymes x3 -BMET -fasting lipid panel -cardiac consult pending -nitroglycerin 4mg  sl as needed for pain -daily weights and strict Is and Os  (2)hyperlipidemia:  -FLP pending -continue crestor 40mg  daily  ()HTN:  Continue home medications currently normatensive  ()DEPRESSION/ANXIETY: Continue home medications ()VTE PROPH: Heparin ppx

## 2010-06-08 NOTE — Progress Notes (Signed)
Summary: Soc. 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 Work Call placed to: Patient Summary of Call: 1-800 Medicare.  Patient has [employer] as his Medicare D plan.  [phone] is the phone number.

## 2010-06-08 NOTE — Assessment & Plan Note (Signed)
Summary: F/U/EST/VS   Vital Signs:  Patient profile:   55 year old male Height:      66 inches (167.64 cm) Weight:      229.1 pounds (104.14 kg) BMI:     37.11 Temp:     7.1 degrees F (-13.83 degrees C) Pulse rate:   91 / minute BP sitting:   140 / 85  (right arm) Cuff size:   large  Vitals Entered By: Dorie Rank RN (July 21, 2009 9:17 AM) CC: c/o right forehead temple h/a about 1 1/2 weeks - ran out of meds for approx 2 week "did not have enough money" Is Patient Diabetic? No Nutritional Status BMI of > 30 = obese  Does patient need assistance? Functional Status Self care Ambulation Normal   Primary Care Provider:  Lars Mage MD  CC:  c/o right forehead temple h/a about 1 1/2 weeks - ran out of meds for approx 2 week "did not have enough money".  History of Present Illness: 55 year old man with PMH of unproven gout, HTN, and following Dr Lajoyce Corners for osteomylitis of left foot is here for a follow up appointment. He did not take any of his meds for 2 weeks now as he was not able to afford them and we changed his meds last time to the ones on the 4 dollar list. His BP is well controlled on current meds. He complains of having problems with erections for more then a year. He does not have a morning erection. There was no change in diet or meds. No h/o trauma. No history s/o of any acute psychological stress. He could not afford Viagra which we prescribed last time around. Also complains of headaches for last 2 weeks mainly on the right side of head, mild aching type pain, 3/10 at its worse, comes and goes whole day, no agg or relieving factors. No other complaints today.  Preventive Screening-Counseling & Management  Alcohol-Tobacco     Smoking Status: quit     Year Quit: 2008  Problems Prior to Update: 1)  Erectile Dysfunction, Non-organic  (ICD-302.72) 2)  Leg Pain, Left  (ICD-729.5) 3)  Health Screening  (ICD-V70.0) 4)  Hypertension, Benign Essential   (ICD-401.1) 5)  Hyperlipidemia  (ICD-272.4) 6)  Osteomyelitis, Chronic, Unspecified Site  (ICD-730.10) 7)  Gout  (ICD-274.9) 8)  C A D  (ICD-414.00) 9)  Hyperlipidemia  (ICD-272.4)  Medications Prior to Update: 1)  Pravastatin Sodium 20 Mg Tabs (Pravastatin Sodium) .... Take 1 Tablet By Mouth Once A Day 2)  Lisinopril-Hydrochlorothiazide 20-25 Mg Tabs (Lisinopril-Hydrochlorothiazide) .... Take 1 Tablet By Mouth Once A Day 3)  Doxycycline Hyclate 100 Mg Tabs (Doxycycline Hyclate) .... Take 1 Tablet By Mouth Once A Day 4)  Ciprofloxacin Hcl 500 Mg Tabs (Ciprofloxacin Hcl) .... Take 1 Tablet By Mouth Two Times A Day 5)  Aspirin 81 Mg Chew (Aspirin) .... Take 1 Tablet By Mouth Once A Day 6)  Viagra 25 Mg Tabs (Sildenafil Citrate) .... Take One Tab As Directed On The Bottle or 1/5 To 4 Hours Before Intercourse.  Current Medications (verified): 1)  Pravastatin Sodium 40 Mg Tabs (Pravastatin Sodium) .... Take 1 Tablet By Mouth Once A Day 2)  Lisinopril-Hydrochlorothiazide 20-25 Mg Tabs (Lisinopril-Hydrochlorothiazide) .... Take 1 Tablet By Mouth Once A Day 3)  Doxycycline Hyclate 100 Mg Tabs (Doxycycline Hyclate) .... Take 1 Tablet By Mouth Once A Day 4)  Ciprofloxacin Hcl 500 Mg Tabs (Ciprofloxacin Hcl) .... Take 1 Tablet By Mouth  Two Times A Day 5)  Aspirin 81 Mg Chew (Aspirin) .... Take 1 Tablet By Mouth Once A Day  Allergies (verified): No Known Drug Allergies  Past History:  Past Medical History: Last updated: 01/14/2009 Coronary artery disease Gout Hyperlipidemia Hypertension osteomylitis CTEV(club foot)  Family History: Last updated: 01/14/2009 mother41 year old with diabetes and CAD with pacemaker.  Social History: Last updated: 01/22/2009 Former Smoker Alcohol use-yes Regular exercise-no  Risk Factors: Alcohol Use: 0 (02/16/2009) Exercise: no (02/16/2009)  Risk Factors: Smoking Status: quit (07/21/2009)  Review of Systems      See HPI  Physical  Exam  Additional Exam:  Gen: AOx3, in no acute distress Eyes: PERRL, EOMI ENT:MMM, No erythema noted in posterior pharynx Neck: No JVD, No LAP Chest: CTAB with  good respiratory effort CVS: regular rhythmic rate, NO M/R/G, S1 S2 normal Abdo: soft,ND, BS+x4, Non tender and No hepatosplenomegaly EXT: wearing shoes, feet not examined Neuro: Non focal, gait is normal Skin: no rashes noted    Impression & Recommendations:  Problem # 1:  INADEQUATE MATERIAL RESOURCES (ICD-V60.2) Assessment New This has been lsited as a new problem but infact this has been a chronic and most important problem for him. He again comes today out of his meds for last 2 weeks atleast. We talked to [facility] and helped him with [employer] funding to help him buy his meds from [facility] as evrything he is on is on $4 list. She also suggested to him that he might be eligible for Medicaid or atleast Medicare Part D and in both the cases his meds should be covered. She provided him with adequate information about how to apply for these and will follow back. I will follow up with him whnever I see him next.  Problem # 2:  HYPERTENSION, BENIGN ESSENTIAL (ICD-401.1) Assessment: Unchanged Controlled today but it was stressed that he must be on his BP meds at all times. Bmet is recent and I will check again in another 2 months. His updated medication list for this problem includes:    Lisinopril-hydrochlorothiazide 20-25 Mg Tabs (Lisinopril-hydrochlorothiazide) .Marland Kitchen... Take 1 tablet by mouth once a day  BP today: 140/85 Prior BP: 134/83 (02/16/2009)  Labs Reviewed: K+: 4.4 (03/19/2009) Creat: : 1.17 (03/19/2009)   Chol: 223 (03/19/2009)   HDL: 35 (03/19/2009)   LDL: 163 (03/19/2009)   TG: 127 (03/19/2009)  Problem # 3:  HYPERLIPIDEMIA (ICD-272.4) Patient's lipids are out of goal with HDL being 35 and LDL 163. I stressed upon the continued use of statin and also provided written and printed material to help with the diet to  control Lipids. His updated medication list for this problem includes:    Pravastatin Sodium 40 Mg Tabs (Pravastatin sodium) .Marland Kitchen... Take 1 tablet by mouth once a day  Labs Reviewed: SGOT: 22 (03/19/2009)   SGPT: 21 (03/19/2009)   HDL:35 (03/19/2009), 34 (01/14/2009)  LDL:163 (03/19/2009), 141 (01/14/2009)  Chol:223 (03/19/2009), 218 (01/14/2009)  Trig:127 (03/19/2009), 213 (01/14/2009)  Problem # 4:  OSTEOMYELITIS, CHRONIC, UNSPECIFIED SITE (ICD-730.10) Assessment: Improved Patient saw Dr Lajoyce Corners 1 month ago and says that the infection seems to be finally settled down. I stressed upon the importance of continuing antibiotics unless told otherwise. I also requested sharon to include Dr Audrie Lia office note in the EMR.  Problem # 5:  GOUT (ICD-274.9) Patient brought empty bottles of Colchicine and allopurinol and told me that he has been diagnosed with Gout by Dr Lajoyce Corners and reconfirmed by an urgent care centre 2 weeks  ago involving the area around his ostelyelitis. He has never had podagra. Me and Dr Aundria Rud highly doubt the diagnosis of Gout as he has never had a Microscopic diagnosis of gout. We discussed to look at the notes of Dr Lajoyce Corners regarding this matter. e did not fill his prescription of colchicine or allopuriol at this time and plan to adress this issue if he has flare of gout. Elevate extremity; warm compresses, symptomatic relief and medication as directed.   Problem # 6:  Preventive Health Care (ICD-V70.0)  Reviewed preventive care protocols, scheduled due services, and updated immunizations.  Complete Medication List: 1)  Pravastatin Sodium 40 Mg Tabs (Pravastatin sodium) .... Take 1 tablet by mouth once a day 2)  Lisinopril-hydrochlorothiazide 20-25 Mg Tabs (Lisinopril-hydrochlorothiazide) .... Take 1 tablet by mouth once a day 3)  Doxycycline Hyclate 100 Mg Tabs (Doxycycline hyclate) .... Take 1 tablet by mouth once a day 4)  Ciprofloxacin Hcl 500 Mg Tabs (Ciprofloxacin hcl) .... Take 1  tablet by mouth two times a day 5)  Aspirin 81 Mg Chew (Aspirin) .... Take 1 tablet by mouth once a day  Patient Instructions: 1)  Please schedule a follow-up appointment in 2 weeks Make an appontment for friday mroning 25th if possible.Marland Kitchen 2)  Check your blood sugars regularly. If your readings are usually above : or below 70 you should contact our office. 3)  Take 400-600mg  of Ibuprofen (Advil, Motrin) with food every 4-6 hours as needed for relief of pain or comfort of fever. Prescriptions: ASPIRIN 81 MG CHEW (ASPIRIN) Take 1 tablet by mouth once a day  #30 x 11   Entered and Authorized by:   Lars Mage MD   Signed by:   Lars Mage MD on 07/21/2009   Method used:   Electronically to        [facility] [phone]* (retail)       [address]       [place], [place]  [address]       Ph: [phone]       Fax: [phone]   RxID:   [id number] PRAVASTATIN SODIUM 40 MG TABS (PRAVASTATIN SODIUM) Take 1 tablet by mouth once a day  #30 x 11   Entered and Authorized by:   Lars Mage MD   Signed by:   Lars Mage MD on 07/21/2009   Method used:   Electronically to        [facility] [phone]* (retail)       [address]       [place], [place]  [address]       Ph: [phone]       Fax: [phone]   RxID:   [id number] CIPROFLOXACIN HCL 500 MG TABS (CIPROFLOXACIN HCL) Take 1 tablet by mouth two times a day  #62 x 11   Entered and Authorized by:   Lars Mage MD   Signed by:   Lars Mage MD on 07/21/2009   Method used:   Electronically to        [facility] [phone]* (retail)       [address]       [place], [place]  [address]       Ph: [phone]       Fax: [phone]   RxID:   [id number] DOXYCYCLINE HYCLATE 100 MG TABS (DOXYCYCLINE HYCLATE) Take 1 tablet by mouth once a day  #31 x 111   Entered and Authorized by:   Lars Mage MD  Signed by:   Lars Mage MD on 07/21/2009   Method used:   Electronically to        [facility] [phone]*  (retail)       [address]       [place], [place]  [address]       Ph: [phone]       Fax: [phone]   RxID:   [id number] LISINOPRIL-HYDROCHLOROTHIAZIDE 20-25 MG TABS (LISINOPRIL-HYDROCHLOROTHIAZIDE) Take 1 tablet by mouth once a day  #31 x 11   Entered and Authorized by:   Lars Mage MD   Signed by:   Lars Mage MD on 07/21/2009   Method used:   Electronically to        [facility] [phone]* (retail)       [address]       [place], [place]  [address]       Ph: [phone]       Fax: [phone]   RxID:   [id number]    Prevention & Chronic Care Immunizations   Influenza vaccine: Fluvax 3+  (02/16/2009)   Influenza vaccine deferral: Not available  (01/22/2009)    Tetanus booster: Not documented   Td booster deferral: Deferred  (02/16/2009)    Pneumococcal vaccine: Not documented  Colorectal Screening   Hemoccult: Not documented   Hemoccult action/deferral: Deferred  (01/14/2009)    Colonoscopy: Not documented   Colonoscopy action/deferral: Deferred  (07/21/2009)  Other Screening   PSA: Not documented   PSA action/deferral: Discussion deferred  (01/14/2009)   Smoking status: quit  (07/21/2009)  Lipids   Total Cholesterol: 223  (03/19/2009)   Lipid panel action/deferral: Lipid Panel ordered   LDL: 163  (03/19/2009)   LDL Direct: Not documented   HDL: 35  (03/19/2009)   Triglycerides: 127  (03/19/2009)    SGOT (AST): 22  (03/19/2009)   SGPT (ALT): 21  (03/19/2009)   Alkaline phosphatase: 126  (03/19/2009)   Total bilirubin: 0.4  (03/19/2009)    Lipid flowsheet reviewed?: Yes   Progress toward LDL goal: Unchanged  Hypertension   Last Blood Pressure: 140 / 85  (07/21/2009)   Serum creatinine: 1.17  (03/19/2009)   Serum potassium 4.4  (03/19/2009)    Hypertension flowsheet reviewed?: Yes   Progress toward BP goal: At goal  Self-Management Support :   Personal Goals (by the next clinic visit) :      Personal blood pressure goal: 140/90   (01/22/2009)     Personal LDL goal: 100  (01/22/2009)    Patient will work on the following items until the next clinic visit to reach self-care goals:     Medications and monitoring: take my medicines every day  (07/21/2009)     Eating: use fresh or frozen vegetables, eat baked foods instead of fried foods  (07/21/2009)     Activity: take a 30 minute walk every day  (02/16/2009)     Other: having hard time cutting back on salt and no exercise due to multiple surgeries on left foot and gout in right foot  (07/21/2009)    Hypertension self-management support: Written self-care plan, Education handout, Pre-printed educational material  (07/21/2009)   Hypertension self-care plan printed.   Hypertension education handout printed    Lipid self-management support: Education handout, Pre-printed educational material, Written self-care plan  (07/21/2009)   Lipid self-care plan printed.   Lipid education handout printed   Nursing Instructions: Please include note from Dr Audrie Lia office.Entire Last year.

## 2010-06-08 NOTE — Assessment & Plan Note (Signed)
Summary: acute-bee sting-(garg)/cfb   Vital Signs:  Patient profile:   55 year old male Height:      66 inches (167.64 cm) Weight:      225.4 pounds (102.45 kg) BMI:     36.51 Temp:     97.8 degrees F (36.56 degrees C) oral Pulse rate:   81 / minute BP sitting:   168 / 103  (right arm)  Vitals Entered By: Stanton Kidney Ditzler RN (December 07, 2009 10:43 AM) Is Patient Diabetic? Yes Did you bring your meter with you today? No Pain Assessment Patient in pain? yes     Location: left hand Intensity: 6-7 Type: itching Onset of pain  since 12/04/09 Nutritional Status BMI of > 30 = obese Nutritional Status Detail appetite good CBG Result 110  Have you ever been in a relationship where you felt threatened, hurt or afraid?denies   Does patient need assistance? Functional Status Self care Ambulation Normal Comments Got stung yellow jacket on 12/04/09 left hand - not sleeping due to itching.   Primary Care Provider:  Lars Mage MD   History of Present Illness: 55 yo male with HLD, HTN, CAD (NSTEMI in 2006), newly diagnosed diabetes with Hba1c 7.2, of present outpatient clinic for one week follow-up after the restart of his blood pressure medication.  The patient's blood pressure remains elevated at hundred and 180/100.  patient reports a bee sting last Friday, initially, it was only painful and yesterday it started swelling and today.  He presents with a reduced range of motion on his left hand along with swelling and pain the patient has never had an allergic reaction before and denies any symptoms of anaphylaxis.  HLD: Not taking his meds as he is out of it.  Diabetes: well-controlled the patient's taking his medication.  Pt denies fever, chills, or any other complaints, and otherwise doing well.   Depression History:      The patient denies a depressed mood most of the day and a diminished interest in his usual daily activities.         Preventive Screening-Counseling &  Management  Alcohol-Tobacco     Alcohol drinks/day: 0     Smoking Status: quit     Year Quit: 2008  Caffeine-Diet-Exercise     Does Patient Exercise: no  Current Medications (verified): 1)  Pravastatin Sodium 40 Mg Tabs (Pravastatin Sodium) .... Take 1 Tablet By Mouth Once A Day 2)  Lisinopril-Hydrochlorothiazide 20-25 Mg Tabs (Lisinopril-Hydrochlorothiazide) .... Take 1 Tablet By Mouth Once A Day 3)  Aspirin 81 Mg Chew (Aspirin) .... Take 1 Tablet By Mouth Once A Day 4)  Ativan 0.5 Mg Tabs (Lorazepam) .... Take 1 Tablet Once Daily As Needed For Anxiety 5)  Metformin Hcl 500 Mg Tabs (Metformin Hcl) .... Take 1 Tablet By Mouth Two Times A Day 6)  Norvasc 5 Mg Tabs (Amlodipine Besylate) .... Take 1 Tablet By Mouth Once A Day 7)  Prednisone (Pak) 10 Mg Tabs (Prednisone) .... Take 6 Tablets By Mouth On The First Day and Taper By 10mg  Daily Until Finish 8)  Anti-Hist 25 Mg Caps (Diphenhydramine Hcl) .... Take 1 Tablet By Mouth Once A Day 9)  Doxycycline Hyclate 100 Mg Solr (Doxycycline Hyclate) .... Take 1 Tablet By Mouth Two Times A Day  Allergies: 1)  ! Morphine  Review of Systems       Per HPI  Physical Exam  General:  Well-developed,well-nourished,in no acute distress; alert,appropriate and cooperative throughout examination Head:  Normocephalic  and atraumatic without obvious abnormalities. No apparent alopecia or balding. Mouth:  signs of tobacco abuse in the past.fair dentition, teeth missing, and excessive plaque.   Neck:  No deformities, masses, or tenderness noted. Lungs:  Normal respiratory effort, chest expands symmetrically. Lungs are clear to auscultation, no crackles or wheezes. Heart:  Normal rate and regular rhythm. S1 and S2 normal without gallop, murmur, click, rub or other extra sounds. Abdomen:  Bowel sounds positive,abdomen soft and non-tender without masses, organomegaly or hernias noted. Msk:  no joint swelling, no joint warmth, and no redness over joints.     Pulses:  2+ DP/PT pulses bilaterally  Extremities:  Left hand swolen, with reduced ROM,  Neurologic:  alert & oriented X3, cranial nerves II-XII intact, strength normal in all extremities, sensation intact to light touch, and gait normal.     Skin:   turgor normal and no rashes.  Psych:  Oriented X3, memory intact for recent and remote, normally interactive, good eye contact, not anxious appearing, and not depressed appearing.    Impression & Recommendations:  Problem # 1:  BEE STING REACTION, LOCAL (ICD-989.5) up to the patient's delayed reaction of local swelling to the dorsum of his left hand.  There is a concern for compartment syndrome.  I have contacted the hand surgeon, who suggested that the patient should be on doxycycline for 7 days and that the patient should be reassessed after that and to only give him a call.  The symptoms get worse also for symptomatically and were giving the patient a prednisone pack and Benadryl.  We have informed the patient to call us if symptoms get worse.   Orders: Misc. Referral (Misc. Ref)  Problem # 2:  HYPERTENSION, BENIGN ESSENTIAL (ICD-401.1)  blood pressure is not well controlled despite restarting his medications.  Last week.  We will check a basic metabolic profile today, and will add amlodipine to of the patient's medication and reassess in two weeks.  His updated medication list for this problem includes:    Lisinopril-hydrochlorothiazide 20-25 Mg Tabs (Lisinopril-hydrochlorothiazide) .Marland Kitchen... Take 1 tablet by mouth once a day    Norvasc 5 Mg Tabs (Amlodipine besylate) .Marland Kitchen... Take 1 tablet by mouth once a day  Labs Reviewed: Creat: 0.97 (11/30/2009)    Reviewed HgBA1c results: 6.8 (11/30/2009)  6.5 (01/14/2009)  Orders: T-Basic Metabolic Panel [phone])  BP today: 168/103 Prior BP: 167/96 (11/30/2009)  Labs Reviewed: K+: 4.4 (11/30/2009) Creat: : 0.97 (11/30/2009)   Chol: 216 (11/30/2009)   HDL: 32 (11/30/2009)   LDL: 140  (11/30/2009)   TG: 218 (11/30/2009)  Problem # 3:  DIAB W/O COMP TYPE II/UNS NOT STATED UNCNTRL (ICD-250.00) Well controlled on current treatment, No new changes made today, Will continue to monitor.   His updated medication list for this problem includes:    Lisinopril-hydrochlorothiazide 20-25 Mg Tabs (Lisinopril-hydrochlorothiazide) .Marland Kitchen... Take 1 tablet by mouth once a day    Aspirin 81 Mg Chew (Aspirin) .Marland Kitchen... Take 1 tablet by mouth once a day    Metformin Hcl 500 Mg Tabs (Metformin hcl) .Marland Kitchen... Take 1 tablet by mouth two times a day    Labs Reviewed: Creat: 0.97 (11/30/2009)    Reviewed HgBA1c results: 6.8 (11/30/2009)  6.5 (01/14/2009)  Problem # 4:  HYPERLIPIDEMIA (ICD-272.4) Pt not taking statins, will recheck after patient resumes his statins.  His updated medication list for this problem includes:    Pravastatin Sodium 40 Mg Tabs (Pravastatin sodium) .Marland Kitchen... Take 1 tablet by mouth once a day  Labs Reviewed: SGOT: 19 (11/30/2009)   SGPT: 20 (11/30/2009)   HDL:32 (11/30/2009), 35 (03/19/2009)  LDL:140 (11/30/2009), 163 (03/19/2009)  Chol:216 (11/30/2009), 223 (03/19/2009)  Trig:218 (11/30/2009), 127 (03/19/2009)  Complete Medication List: 1)  Pravastatin Sodium 40 Mg Tabs (Pravastatin sodium) .... Take 1 tablet by mouth once a day 2)  Lisinopril-hydrochlorothiazide 20-25 Mg Tabs (Lisinopril-hydrochlorothiazide) .... Take 1 tablet by mouth once a day 3)  Aspirin 81 Mg Chew (Aspirin) .... Take 1 tablet by mouth once a day 4)  Ativan 0.5 Mg Tabs (Lorazepam) .... Take 1 tablet once daily as needed for anxiety 5)  Metformin Hcl 500 Mg Tabs (Metformin hcl) .... Take 1 tablet by mouth two times a day 6)  Norvasc 5 Mg Tabs (Amlodipine besylate) .... Take 1 tablet by mouth once a day 7)  Prednisone (pak) 10 Mg Tabs (Prednisone) .... Take 6 tablets by mouth on the first day and taper by 10mg  daily until finish 8)  Anti-hist 25 Mg Caps (Diphenhydramine hcl) .... Take 1 tablet by mouth once  a day 9)  Doxycycline Hyclate 100 Mg Solr (Doxycycline hyclate) .... Take 1 tablet by mouth two times a day  Other Orders: Capillary Blood Glucose/CBG ([id number])  Patient Instructions: 1)  Please schedule a follow-up appointment in 2 weeks. 2)  Please take your antibiotics as prescribed, and call us if it gets any worse. Prescriptions: DOXYCYCLINE HYCLATE 100 MG SOLR (DOXYCYCLINE HYCLATE) Take 1 tablet by mouth two times a day  #14 x 0   Entered and Authorized by:   Darnelle Maffucci MD   Signed by:   Darnelle Maffucci MD on 12/07/2009   Method used:   Print then Give to Patient   RxID:   [id number] ANTI-HIST 25 MG CAPS (DIPHENHYDRAMINE HCL) Take 1 tablet by mouth once a day  #30 x 0   Entered and Authorized by:   Darnelle Maffucci MD   Signed by:   Darnelle Maffucci MD on 12/07/2009   Method used:   Print then Give to Patient   RxID:   [id number] PREDNISONE (PAK) 10 MG TABS (PREDNISONE) take 6 tablets by mouth on the first day and taper by 10mg  daily until finish  #1 x 0   Entered and Authorized by:   Darnelle Maffucci MD   Signed by:   Darnelle Maffucci MD on 12/07/2009   Method used:   Print then Give to Patient   RxID:   [id number] NORVASC 5 MG TABS (AMLODIPINE BESYLATE) Take 1 tablet by mouth once a day  #30 x 0   Entered and Authorized by:   Darnelle Maffucci MD   Signed by:   Darnelle Maffucci MD on 12/07/2009   Method used:   Print then Give to Patient   RxID:   [id number]   Prevention & Chronic Care Immunizations   Influenza vaccine: Fluvax 3+  (02/16/2009)   Influenza vaccine deferral: Not indicated  (08/10/2009)    Tetanus booster: Not documented   Td booster deferral: Deferred  (11/30/2009)    Pneumococcal vaccine: Pneumovax  (11/30/2009)  Colorectal Screening   Hemoccult: Not documented   Hemoccult action/deferral: Refused  (08/10/2009)    Colonoscopy: Not documented   Colonoscopy action/deferral: Deferred  (11/30/2009)  Other Screening   PSA: Not  documented   PSA action/deferral: Not indicated  (08/10/2009)   Smoking status: quit  (12/07/2009)  Diabetes Mellitus   HgbA1C: 6.8  (11/30/2009)    Eye exam: Not documented   Diabetic eye exam action/deferral: Ophthalmology referral  (11/30/2009)  Foot exam: Not documented   High risk foot: Not documented   Foot care education: Not documented    Urine microalbumin/creatinine ratio: 3.6  (11/30/2009)   Urine microalbumin action/deferral: Ordered  Lipids   Total Cholesterol: 216  (11/30/2009)   Lipid panel action/deferral: Lipid Panel ordered   LDL: 140  (11/30/2009)   LDL Direct: Not documented   HDL: 32  (11/30/2009)   Triglycerides: 218  (11/30/2009)    SGOT (AST): 19  (11/30/2009)   BMP action: Ordered   SGPT (ALT): 20  (11/30/2009)   Alkaline phosphatase: 123  (11/30/2009)   Total bilirubin: 0.4  (11/30/2009)  Hypertension   Last Blood Pressure: 168 / 103  (12/07/2009)   Serum creatinine: 0.97  (11/30/2009)   BMP action: Ordered   Serum potassium 4.4  (11/30/2009)  Self-Management Support :   Personal Goals (by the next clinic visit) :     Personal A1C goal: 7  (12/07/2009)     Personal blood pressure goal: 140/90  (01/22/2009)     Personal LDL goal: 100  (01/22/2009)    Patient will work on the following items until the next clinic visit to reach self-care goals:     Medications and monitoring: take my medicines every day, examine my feet every day  (12/07/2009)     Eating: eat more vegetables, use fresh or frozen vegetables, eat baked foods instead of fried foods, eat fruit for snacks and desserts, limit or avoid alcohol  (12/07/2009)     Activity: take a 30 minute walk every day  (11/30/2009)     Other: having hard time cutting back on salt and no exercise due to multiple surgeries on left foot and gout in right foot  (07/21/2009)    Diabetes self-management support: Written self-care plan, Education handout, Resources for patients handout  (12/07/2009)    Diabetes care plan printed   Diabetes education handout printed    Hypertension self-management support: Written self-care plan, Education handout, Resources for patients handout  (12/07/2009)   Hypertension self-care plan printed.   Hypertension education handout printed    Lipid self-management support: Written self-care plan, Resources for patients handout  (12/07/2009)   Lipid self-care plan printed.      Resource handout printed.  Process Orders Check Orders Results:     [facility]: Check successful Tests Sent for requisitioning (December 07, 2009 12:14 PM):     12/07/2009: [facility] -- T-Basic Metabolic Panel [phone] (signed)

## 2010-06-08 NOTE — Assessment & Plan Note (Signed)
Summary: 2 WEEK RECHECK/CH   Vital Signs:  Patient profile:   55 year old male Height:      66 inches (167.64 cm) Weight:      222.3 pounds (101.05 kg) BMI:     36.01 Temp:     97.0 degrees F oral Pulse rate:   100 / minute BP sitting:   124 / 77  (right arm)  Vitals Entered By: Chinita Pester RN (July 31, 2009 9:28 AM) CC: F/U visit.  c/o SOB sometimes while he's walking. Is Patient Diabetic? No Pain Assessment Patient in pain? yes     Location: top of right foot Intensity: 8 Type: aching Onset of pain  With activity Nutritional Status BMI of > 30 = obese  Have you ever been in a relationship where you felt threatened, hurt or afraid?No   Does patient need assistance? Functional Status Self care Ambulation Normal   Primary Care Provider:  Lars Mage MD  CC:  F/U visit.  c/o SOB sometimes while he's walking.Daniel Perkins  History of Present Illness: 55 yeal old man with PMh as described in EMR is here today for a new onset chest pain and dyspnoe on exertion. He says that the pain started 3 weeks ago, it was present in the center of his chest, severe burning like sensation, aggravated by walking, not present everytime with physical activity but has been happening at least 2-3 times day for last 3 weeks. He was completely normal 3 weeks ago. The pain is 10/10 at its worse and relieved with rest. Patient had NSTEMI 6 years ago and had a cath done at that time. He has not followed up with any cardiologist for 4-5 years as he didnt pay his earlier bill.  Also complaints of left foot pain for last 1 week and has an appointment with Dr Lajoyce Corners on Monday.  Compliant with his meds.   Depression History:      The patient denies a depressed mood most of the day and a diminished interest in his usual daily activities.         Preventive Screening-Counseling & Management  Alcohol-Tobacco     Alcohol drinks/day: 0     Smoking Status: quit     Year Quit: 2008  Caffeine-Diet-Exercise  Does Patient Exercise: no  Problems Prior to Update: 1)  Inadequate Material Resources  (ICD-V60.2) 2)  Erectile Dysfunction, Non-organic  (ICD-302.72) 3)  Leg Pain, Left  (ICD-729.5) 4)  Health Screening  (ICD-V70.0) 5)  Hypertension, Benign Essential  (ICD-401.1) 6)  Hyperlipidemia  (ICD-272.4) 7)  Osteomyelitis, Chronic, Unspecified Site  (ICD-730.10) 8)  Gout  (ICD-274.9) 9)  Hyperlipidemia  (ICD-272.4)  Medications Prior to Update: 1)  Pravastatin Sodium 40 Mg Tabs (Pravastatin Sodium) .... Take 1 Tablet By Mouth Once A Day 2)  Lisinopril-Hydrochlorothiazide 20-25 Mg Tabs (Lisinopril-Hydrochlorothiazide) .... Take 1 Tablet By Mouth Once A Day 3)  Doxycycline Hyclate 100 Mg Tabs (Doxycycline Hyclate) .... Take 1 Tablet By Mouth Once A Day 4)  Ciprofloxacin Hcl 500 Mg Tabs (Ciprofloxacin Hcl) .... Take 1 Tablet By Mouth Two Times A Day 5)  Aspirin 81 Mg Chew (Aspirin) .... Take 1 Tablet By Mouth Once A Day  Current Medications (verified): 1)  Pravastatin Sodium 40 Mg Tabs (Pravastatin Sodium) .... Take 1 Tablet By Mouth Once A Day 2)  Lisinopril-Hydrochlorothiazide 20-25 Mg Tabs (Lisinopril-Hydrochlorothiazide) .... Take 1 Tablet By Mouth Once A Day 3)  Doxycycline Hyclate 100 Mg Tabs (Doxycycline Hyclate) .... Take 1 Tablet By Mouth  Once A Day 4)  Ciprofloxacin Hcl 500 Mg Tabs (Ciprofloxacin Hcl) .... Take 1 Tablet By Mouth Two Times A Day 5)  Aspirin 81 Mg Chew (Aspirin) .... Take 1 Tablet By Mouth Once A Day  Allergies (verified): No Known Drug Allergies  Review of Systems      See HPI  Physical Exam  Additional Exam:  Gen: AOx3, in no acute distress Eyes: PERRL, EOMI ENT:MMM, No erythema noted in posterior pharynx Neck: No JVD, No LAP Chest: CTAB with  good respiratory effort CVS: regular rhythmic rate, NO M/R/G, S1 S2 normal Abdo: soft,ND, BS+x4, Non tender and No hepatosplenomegaly EXT: No odema noted Neuro: Non focal, gait is normal Skin: no rashes noted.     Impression & Recommendations:  Problem # 1:  CORONARY ARTERY DISEASE (ICD-414.00) Assessment New  Patient is complaining of chest pain associated with dyspnoea which is really concerning for angina. We will admit the patient for rule out MI to the inpatient service. His updated medication list for this problem includes:    Lisinopril-hydrochlorothiazide 20-25 Mg Tabs (Lisinopril-hydrochlorothiazide) .Daniel Perkins... Take 1 tablet by mouth once a day    Aspirin 81 Mg Chew (Aspirin) .Daniel Perkins... Take 1 tablet by mouth once a day  Orders: 12 Lead EKG (12 Lead EKG)  Complete Medication List: 1)  Pravastatin Sodium 40 Mg Tabs (Pravastatin sodium) .... Take 1 tablet by mouth once a day 2)  Lisinopril-hydrochlorothiazide 20-25 Mg Tabs (Lisinopril-hydrochlorothiazide) .... Take 1 tablet by mouth once a day 3)  Doxycycline Hyclate 100 Mg Tabs (Doxycycline hyclate) .... Take 1 tablet by mouth once a day 4)  Ciprofloxacin Hcl 500 Mg Tabs (Ciprofloxacin hcl) .... Take 1 tablet by mouth two times a day 5)  Aspirin 81 Mg Chew (Aspirin) .... Take 1 tablet by mouth once a day  Prevention & Chronic Care Immunizations   Influenza vaccine: Fluvax 3+  (02/16/2009)   Influenza vaccine deferral: Not available  (01/22/2009)    Tetanus booster: Not documented   Td booster deferral: Deferred  (02/16/2009)    Pneumococcal vaccine: Not documented  Colorectal Screening   Hemoccult: Not documented   Hemoccult action/deferral: Deferred  (01/14/2009)    Colonoscopy: Not documented   Colonoscopy action/deferral: Deferred  (07/21/2009)  Other Screening   PSA: Not documented   PSA action/deferral: Discussion deferred  (01/14/2009)   Smoking status: quit  (07/31/2009)  Lipids   Total Cholesterol: 223  (03/19/2009)   Lipid panel action/deferral: Lipid Panel ordered   LDL: 163  (03/19/2009)   LDL Direct: Not documented   HDL: 35  (03/19/2009)   Triglycerides: 127  (03/19/2009)    SGOT (AST): 22  (03/19/2009)    SGPT (ALT): 21  (03/19/2009)   Alkaline phosphatase: 126  (03/19/2009)   Total bilirubin: 0.4  (03/19/2009)  Hypertension   Last Blood Pressure: 124 / 77  (07/31/2009)   Serum creatinine: 1.17  (03/19/2009)   Serum potassium 4.4  (03/19/2009)  Self-Management Support :   Personal Goals (by the next clinic visit) :      Personal blood pressure goal: 140/90  (01/22/2009)     Personal LDL goal: 100  (01/22/2009)    Hypertension self-management support: Resources for patients handout  (07/31/2009)    Lipid self-management support: Resources for patients handout  (07/31/2009)        Resource handout printed.   #20G angiocath NSL started in right wrist without difficulty. Site unremarkable.  Chinita Pester RN  July 31, 2009 10:57 AM

## 2010-06-08 NOTE — Miscellaneous (Signed)
Summary: BIO-TECH: Physician Statement; Shoes  BIO-TECH: Physician Statement; Shoes   Imported By: Shon Hough 11/27/2009 13:55:42  _____________________________________________________________________  External Attachment:    Type:   Image     Comment:   External Document

## 2010-06-08 NOTE — Assessment & Plan Note (Signed)
Summary: HFU-(GARG)/CFB   Vital Signs:  Patient profile:   55 year old male Height:      66 inches Weight:      221.7 pounds BMI:     35.91 Temp:     97.8 degrees F oral Pulse rate:   89 / minute BP sitting:   127 / 80  (right arm)  Vitals Entered By: Filomena Jungling NT II (August 10, 2009 9:41 AM) CC: HFU -  STILL HAVING PAIN AND SOB Pain Assessment Patient in pain? yes     Location: chest Intensity: 7 Type: aching Nutritional Status BMI of > 30 = obese  Does patient need assistance? Functional Status Self care Ambulation Normal   Primary Care Provider:  Lars Mage MD  CC:  HFU -  STILL HAVING PAIN AND SOB.  History of Present Illness: 55 yo male with HLD, HTN, CAD (NSTEMI in 2006), newly diagnosed diabetes, recurrent osteomyelitis presents to [facility] for regular follow up appointment on his recent hospitalization for SOB and chest pain. His cardiac cath showed nonobstructive coronary disease with 20% to 30% obtuse marginal, normal left ventricular function. His chest pain was determined to be noncardiac  and possibly related to GERD. Pt today still reports same symptoms of SOB and dizziness as he had while in hospital but no worsening, no chest pain. He was started on Meclizine duing the hospitalization for dizziness and was told that this could probably be related to his diabetes. He denies fever, chills, abdominal or urinary concerns. No unilteral leg swelling or hemoptysis or pleurisy. He denies any syncopal episodes.  In addition, denies recent weight loss or gain, no changes in mood or appetite. No weakness, no difficulty with performing daily activities.   Preventive Screening-Counseling & Management  Alcohol-Tobacco     Alcohol drinks/day: 0     Smoking Status: quit     Year Quit: 2008  Caffeine-Diet-Exercise     Does Patient Exercise: no  Problems Prior to Update: 1)  Coronary Artery Disease  (ICD-414.00) 2)  Inadequate Material Resources  (ICD-V60.2) 3)  Erectile  Dysfunction, Non-organic  (ICD-302.72) 4)  Leg Pain, Left  (ICD-729.5) 5)  Health Screening  (ICD-V70.0) 6)  Hypertension, Benign Essential  (ICD-401.1) 7)  Hyperlipidemia  (ICD-272.4) 8)  Osteomyelitis, Chronic, Unspecified Site  (ICD-730.10) 9)  Gout  (ICD-274.9) 10)  Hyperlipidemia  (ICD-272.4)  Medications Prior to Update: 1)  Pravastatin Sodium 40 Mg Tabs (Pravastatin Sodium) .... Take 1 Tablet By Mouth Once A Day 2)  Lisinopril-Hydrochlorothiazide 20-25 Mg Tabs (Lisinopril-Hydrochlorothiazide) .... Take 1 Tablet By Mouth Once A Day 3)  Doxycycline Hyclate 100 Mg Tabs (Doxycycline Hyclate) .... Take 1 Tablet By Mouth Once A Day 4)  Ciprofloxacin Hcl 500 Mg Tabs (Ciprofloxacin Hcl) .... Take 1 Tablet By Mouth Two Times A Day 5)  Aspirin 81 Mg Chew (Aspirin) .... Take 1 Tablet By Mouth Once A Day  Current Medications (verified): 1)  Pravastatin Sodium 40 Mg Tabs (Pravastatin Sodium) .... Take 1 Tablet By Mouth Once A Day 2)  Lisinopril-Hydrochlorothiazide 20-25 Mg Tabs (Lisinopril-Hydrochlorothiazide) .... Take 1 Tablet By Mouth Once A Day 3)  Doxycycline Hyclate 100 Mg Tabs (Doxycycline Hyclate) .... Take 1 Tablet By Mouth Once A Day 4)  Ciprofloxacin Hcl 500 Mg Tabs (Ciprofloxacin Hcl) .... Take 1 Tablet By Mouth Two Times A Day 5)  Aspirin 81 Mg Chew (Aspirin) .... Take 1 Tablet By Mouth Once A Day  Allergies (verified): 1)  ! Morphine  Past History:  Past Medical History: Last updated: 01/14/2009 Coronary artery disease Gout Hyperlipidemia Hypertension osteomylitis CTEV(club foot)  Family History: Last updated: 01/14/2009 mother35 year old with diabetes and CAD with pacemaker.  Social History: Last updated: 01/22/2009 Former Smoker Alcohol use-yes Regular exercise-no  Risk Factors: Alcohol Use: 0 (08/10/2009) Exercise: no (08/10/2009)  Risk Factors: Smoking Status: quit (08/10/2009)  Family History: Reviewed history from 01/14/2009 and no changes  required. mother52 year old with diabetes and CAD with pacemaker.  Social History: Reviewed history from 01/22/2009 and no changes required. Former Smoker Alcohol use-yes Regular exercise-no  Review of Systems       per HPI  Physical Exam  General:  Well-developed,well-nourished,in no acute distress; alert,appropriate and cooperative throughout examination Lungs:  Normal respiratory effort, chest expands symmetrically. Lungs are clear to auscultation, no crackles or wheezes. Heart:  Normal rate and regular rhythm. S1 and S2 normal without gallop, murmur, click, rub or other extra sounds. Abdomen:  Bowel sounds positive,abdomen soft and non-tender without masses, organomegaly or hernias noted. Neurologic:  No cranial nerve deficits noted. Station and gait are normal. Plantar reflexes are down-going bilaterally. DTRs are symmetrical throughout. Sensory, motor and coordinative functions appear intact. Psych:  Cognition and judgment appear intact. Alert and cooperative with normal attention span and concentration. No apparent delusions, illusions, hallucinations   Impression & Recommendations:  Problem # 1:  HYPERTENSION, BENIGN ESSENTIAL (ICD-401.1) Good control, will continue the same regimen.  His updated medication list for this problem includes:    Lisinopril-hydrochlorothiazide 20-25 Mg Tabs (Lisinopril-hydrochlorothiazide) .Marland Kitchen... Take 1 tablet by mouth once a day  BP today: 127/80 Prior BP: 124/77 (07/31/2009)  Labs Reviewed: K+: 4.4 (03/19/2009) Creat: : 1.17 (03/19/2009)   Chol: 223 (03/19/2009)   HDL: 35 (03/19/2009)   LDL: 163 (03/19/2009)   TG: 127 (03/19/2009)  Problem # 2:  HYPERLIPIDEMIA (ICD-272.4) His most recent FLP was done in hospital and the results are: Chol 147 TG 126 HLD 20 LDL 104 Will continue the same regimen. His updated medication list for this problem includes:    Pravastatin Sodium 40 Mg Tabs (Pravastatin sodium) .Marland Kitchen... Take 1 tablet by mouth once  a day  Labs Reviewed: SGOT: 22 (03/19/2009)   SGPT: 21 (03/19/2009)   HDL:35 (03/19/2009), 34 (01/14/2009)  LDL:163 (03/19/2009), 141 (01/14/2009)  Chol:223 (03/19/2009), 218 (01/14/2009)  Trig:127 (03/19/2009), 213 (01/14/2009)  Problem # 3:  CORONARY ARTERY DISEASE (ICD-414.00) No new events, will cont. the same regimen.  His updated medication list for this problem includes:    Lisinopril-hydrochlorothiazide 20-25 Mg Tabs (Lisinopril-hydrochlorothiazide) .Marland Kitchen... Take 1 tablet by mouth once a day    Aspirin 81 Mg Chew (Aspirin) .Marland Kitchen... Take 1 tablet by mouth once a day  Problem # 4:  DIABETES MELLITUS, BORDERLINE (ICD-790.29)  Last A1C checked in hospital and it increased from 6.5 to 7.2 in 6 months period. He is on Metformin and will continue the same regimen for now. Will recheck A1C in June and will readjust the regimen as needed.  Labs Reviewed: Creat: 1.17 (03/19/2009)       His updated medication list for this problem includes:    Metformin Hcl 500 Mg Tabs (Metformin hcl) .Marland Kitchen... Take 1 tablet by mouth once a day  Problem # 5:  DYSPNEA (ICD-786.05) Chronic in nature and unclear on the exact etiology. This was worked up in the hospital and following diagnostic tests were all negative, CXR, PFT, cardiac cath consistant with  onobstructive coronary disease with 20% to 30% obtuse marginal, normal left ventricular function.  His O2 sats were WNL and he was also saturating well while ambulating. He appears clinically stable and I suspect a component of anxiety in this. I will give him short coure of low dose ativan and see if that helps. I advised him to come back and see me in next 1-2 weeks if his symptoms do not resolve.   Complete Medication List: 1)  Pravastatin Sodium 40 Mg Tabs (Pravastatin sodium) .... Take 1 tablet by mouth once a day 2)  Lisinopril-hydrochlorothiazide 20-25 Mg Tabs (Lisinopril-hydrochlorothiazide) .... Take 1 tablet by mouth once a day 3)  Doxycycline Hyclate 100 Mg  Tabs (Doxycycline hyclate) .... Take 1 tablet by mouth once a day 4)  Ciprofloxacin Hcl 500 Mg Tabs (Ciprofloxacin hcl) .... Take 1 tablet by mouth two times a day 5)  Aspirin 81 Mg Chew (Aspirin) .... Take 1 tablet by mouth once a day 6)  Ativan 0.5 Mg Tabs (Lorazepam) .... Take 1 tablet once daily as needed for anxiety 7)  Metformin Hcl 500 Mg Tabs (Metformin hcl) .... Take 1 tablet by mouth once a day 8)  Antivert 25 Mg Tabs (Meclizine hcl) .... Take 2 tablets by mouth every day  Patient Instructions: 1)  Please schedule a follow-up appointment in 3 months. 2)  Please check your blood pressure regularly, if it is >170 please call clinic at [phone] 3)  Please check your sugar levels regularly and remember to bring the meter with you to the next clinic appointment, if the sugars are > 350 or < 60 please call us at [phone] 4)  It is important that you exercise regularly at least 20 minutes 5 times a week. If you develop chest pain, have severe difficulty breathing, or feel very tired , stop exercising immediately and seek medical attention. Prescriptions: ATIVAN 0.5 MG TABS (LORAZEPAM) take 1 tablet once daily as needed for anxiety  #30 x 0   Entered and Authorized by:   Mliss Sax MD   Signed by:   Mliss Sax MD on 08/10/2009   Method used:   Print then Give to Patient   RxID:   [id number]   Prevention & Chronic Care Immunizations   Influenza vaccine: Fluvax 3+  (02/16/2009)   Influenza vaccine deferral: Not indicated  (08/10/2009)    Tetanus booster: Not documented   Td booster deferral: Not indicated  (08/10/2009)    Pneumococcal vaccine: Not documented  Colorectal Screening   Hemoccult: Not documented   Hemoccult action/deferral: Refused  (08/10/2009)    Colonoscopy: Not documented   Colonoscopy action/deferral: Refused  (08/10/2009)  Other Screening   PSA: Not documented   PSA action/deferral: Not indicated  (08/10/2009)   Smoking status: quit   (08/10/2009)  Lipids   Total Cholesterol: 223  (03/19/2009)   Lipid panel action/deferral: Lipid Panel ordered   LDL: 163  (03/19/2009)   LDL Direct: Not documented   HDL: 35  (03/19/2009)   Triglycerides: 127  (03/19/2009)    SGOT (AST): 22  (03/19/2009)   SGPT (ALT): 21  (03/19/2009)   Alkaline phosphatase: 126  (03/19/2009)   Total bilirubin: 0.4  (03/19/2009)    Lipid flowsheet reviewed?: Yes   Progress toward LDL goal: Deteriorated  Hypertension   Last Blood Pressure: 127 / 80  (08/10/2009)   Serum creatinine: 1.17  (03/19/2009)   Serum potassium 4.4  (03/19/2009)    Hypertension flowsheet reviewed?: Yes   Progress toward BP goal: At goal  Self-Management Support :   Personal Goals (by the next  clinic visit) :      Personal blood pressure goal: 140/90  (01/22/2009)     Personal LDL goal: 100  (01/22/2009)    Hypertension self-management support: Written self-care plan  (08/10/2009)   Hypertension self-care plan printed.    Lipid self-management support: Written self-care plan  (08/10/2009)   Lipid self-care plan printed.   Process Orders Tests Sent for requisitioning (August 10, 2009 10:20 AM):     08/10/2009: [facility] -- T-Lipid Profile [phone] (signed)

## 2010-06-08 NOTE — Progress Notes (Signed)
Summary: Bee sting  Phone Note Call from Patient   Caller: Patient Call For: Lars Mage MD Summary of Call: Pt walked in this am said that he was stung by bees on Friday.  Has swelling in his left hand.  Alsohas itching in his right arm.  Said that he was also stung earlier last week as well with no swelling noted.  Pt sais he had no shortness of breatth or other symptoms.  Pt said that he has only taken his regular meds.  Did not take any Benadryl of other meds after the bee stings. Angelina Ok RN  December 07, 2009 10:02 AM  Initial call taken by: Angelina Ok RN,  December 07, 2009 10:03 AM

## 2010-06-08 NOTE — Assessment & Plan Note (Signed)
Summary: Soc. Work   Social Work Evaluation Date  07/21/2009 Patient name Runell Gess  Primary MD   : Lars Mage MD Social Worker's name : Dorothe Pea MSW- LCSW  Home [phone]  Work phone: [phone]  Cell phone: .  Marland Kitchen     Alternate phone: . Marland Kitchen       Individual making referral: Dorie Rank, RN  Primary Reason for Referral:     Assist with Medicare D/Medicaid Eligibility/Medication Assist Comments Met with patient in exam room and find a very pleasant AA gentleman.  He is disabled having been on Medicare since 2007.  His income is $937 and his wife Ilda Mori (also a patient of the clinic) works very on and off.  He is unsure of her income in 2010 but thinks it was not even $1,000.  His rent is over $650 per month and he and his wife are having difficulties making ends meet.   The patient used to have Medicaid but once he recd Medicare he never reapplied.  He also tells me he has some type of drug plan called CCR but the copays vary. It is unclear as to whether that is a Medicare D program.  Action taken by Social Work: I've encouraged the patient to reapply for Medicaid as he will likely qualify for full Medicaid or MQB Medicaid which would pay for his Medicare Part B premiums.  Once he qualifies for one of these Medicaid programs it will open doors to a more cost saving Medicare D program.   Best number to reach the patient is [phone] at his mother's home.   $20 from [facility] was given to him today so he could purchase his five meds at [place] under their $4  program.

## 2010-06-08 NOTE — Assessment & Plan Note (Signed)
Summary: DM TEACHING/CH   Vital Signs:  Patient profile:   55 year old male Weight:      226.8 pounds BMI:     36.74 CBG Result 131 Comments this blood sugar is fasting per patient   Allergies: 1)  ! Morphine   Complete Medication List: 1)  Pravastatin Sodium 40 Mg Tabs (Pravastatin sodium) .... Take 1 tablet by mouth once a day 2)  Lisinopril-hydrochlorothiazide 20-25 Mg Tabs (Lisinopril-hydrochlorothiazide) .... Take 1 tablet by mouth once a day 3)  Aspirin 81 Mg Chew (Aspirin) .... Take 1 tablet by mouth once a day 4)  Ativan 0.5 Mg Tabs (Lorazepam) .... Take 1 tablet once daily as needed for anxiety 5)  Metformin Hcl 500 Mg Tabs (Metformin hcl) .... Take 1 tablet by mouth two times a day 6)  Norvasc 5 Mg Tabs (Amlodipine besylate) .... Take 1 tablet by mouth once a day 7)  Prednisone (pak) 10 Mg Tabs (Prednisone) .... Take 6 tablets by mouth on the first day and taper by 10mg  daily until finish 8)  Anti-hist 25 Mg Caps (Diphenhydramine hcl) .... Take 1 tablet by mouth once a day 9)  Doxycycline Hyclate 100 Mg Solr (Doxycycline hyclate) .... Take 1 tablet by mouth two times a day  Other Orders: DSMT(Medicare) Individual, 30 Minutes (G0108)   Orders Added: 1)  DSMT(Medicare) Individual, 30 Minutes [G0108]    Diabetes Self Management Training  PCP: Lars Mage MD Diabetes Type: Type 2 non-insulin Other persons present: Spouse Current smoking Status: quit  Vital Signs Todays Weight: 226.8lb  in BMI 36.74in-lbs   Assessment Sources of Support: wife is patient here- Tyna Epps  Potential Barriers  Economic/Supplies  Low Literacy  Needs review/assistance  Diabetes Medications:  Comments: does not have meter- wife has diabetes and has meter. He is not sure he wants to self monitor at home. he will hink about it nad get back to CDE if he thinks he will use the information gathered by self monitoring.      Activity Limitations  Inadequate physical  activity Diabetes Disease Process  Discussed today Define diabetes in simple terms: No knowledge   State own type of diabetes: No knowledge   State diabetes is treated by meal plan-exercise-medication-monitoring-education: No knowledge Medications State name-action-dose-duration-side effects-and time to take medication: No knowledge    Nutritional Management Identify what foods most often affect blood glucose: No knowledge    Verbalize importance of controlling food portions: No knowledge   State importance of spacing and not omitting meals and snacks: No knowledge   State changes planned for home meals/snacks: No knowledge    Monitoring State purpose and frequency of monitoring BG-ketones-HgbA1C  : No knowledge   Perform glucose monitoring/ketone testing and record results correctly: No knowledge    State target blood glucose and HgbA1C goals: No knowledge    Complications State the causes-signs and symptoms and prevention of Hyperglycemia: No knowledge   Explain proper treatment of hyperglycemia: No knowledge   State the causes- signs and symptoms and prevention of hypoglycemia: Not applicableExplain proper treatment of hypoglycemia: Not applicable Exercise States importance of exercise: No knowledge   States effect of exercise on blood glucose: No knowledge    Lifestyle changes:Goal setting and Problem solving Identify risk factors that interfere with health: Needs review/assistance   Develop strategies to reduce risk factors: Needs review/assistance   List at least two appropriate community resources: No knowledgeIdentify Family/SO role in managing diabetes: Demonstrates competency Psychosocial Adjustment State three common feelings  that might be experienced when learning to cope with diabetes: Needs review/assistanceIdentify two methods to cope with these feelings: Needs review/assistanceIdentify how stress affects diabetes & two sources of stress: Needs review/assistanceName  two ways of obtaining support from family/friends: Needs review/assistanceDiabetes Management Education Done: 02/25/2010    BEHAVIORAL GOALS INITIAL Incorporating appropriate nutritional management: limit food portions that increase blood sugars        Diabetes Self Management Support: wife, clinic staff Follow-up: 1 month    Appended Document: DM TEACHING/CH

## 2010-06-10 NOTE — Progress Notes (Signed)
Summary: ?Gout/ referral  Phone Note Call from Patient   Caller: Patient Call For: Lars Mage MD Complaint: Breathing Problems Summary of Call: Pt walked in said that he has seen Dr. Lajoyce Corners about his pain in his foot.   Was given a Gel to apply that has not worked.  Was told by his foot specialist that he had collapsed bones in his foot.  Talked with Dr. Lajoyce Corners who did not agree.  Has been issued shoes which do not work.  Is being treated for Gout.  Was given Volataren, Uloric and Colcry's.  Has stopped the Colcry's per Dr. Lajoyce Corners.  was given the Voltaren which does not help.   Wants to get a referral to [facility].  Thinks his problem is not Gout.  Has had problems with wounds in his foot.  Took a long period of time to heal.  Does not want the same to happen the same way.  Has not talked to Dr. Lajoyce Corners.  Pt really feels that it is not Gout.  Cannot hold his foot down when sitting on he bed.  Has been this way for about a month. Pt can be reached at [phone]. Angelina Ok RN  April 26, 2010 9:13 AM  Initial call taken by: Angelina Ok RN,  April 26, 2010 9:13 AM  Follow-up for Phone Call        This patient needs to be evaluated. Dr. Lajoyce Corners has been following him for gout and chronic osteomyelitis ion his left foot. Patient here requesting referral to [facility]- This will need to be done at an office visit. He complains of pain in his left foot - we need to get records from [place]. I will give him a prescription -one time only for opiate pain medication until he can be seen. He needs to call Lajoyce Corners today for evaluation of his foot and xrays. I checked the [place] database and he has not had any opiate filled except for small amount from ED. Patient seen and evaluated by me for urgent footy pain complaint- billed as OV. Follow-up by: Julaine Fusi  DO,  April 26, 2010 9:27 AM    New/Updated Medications: NORCO 10-325 MG TABS (HYDROCODONE-ACETAMINOPHEN) Take 1 tablet by mouth two times a day as needed for  pain Prescriptions: NORCO 10-325 MG TABS (HYDROCODONE-ACETAMINOPHEN) Take 1 tablet by mouth two times a day as needed for pain  #40 x 0   Entered and Authorized by:   Julaine Fusi  DO   Signed by:   Julaine Fusi  DO on 04/26/2010   Method used:   Print then Give to Patient   RxID:   [id number]

## 2010-06-10 NOTE — Assessment & Plan Note (Signed)
Summary: ACUTE/GARG/TO SEE [facility]or FOR GOUT/CH   Primary Care Provider:  Lars Mage MD   History of Present Illness: Walk-in with severe right foot pain. Constant 10/10 pain. Difficulty with weight bearing. Tender and warm on lateral side. See phone note for additional details.  Allergies: 1)  ! Morphine   Foot/Ankle Exam  General:    Well-developed, well-nourished ,normal body habitus; no deformities, normal grooming.    Gait:    antalgic.    Skin:    increased warmth and scar: dorsum foot, darkening of skin over LM, mild effusion, very TTP Great toe pain on ROM and mod palpation. Very limited ROM in general with crepitus  Vascular:    capillary refill >2 seconds on right and capillary refill >2 seconds on left.    unable to palpate a dp or Ta pulse   Impression & Recommendations:  Problem # 1:  FOOT PAIN, RIGHT (ICD-729.5) Foot exam completed-unable to palpate pulses due to ankle hardware. Cap refill ok. and foot warm. Lateral maleo effusion. Very ttp. No redness or signs of acute infection but chronic deep infection cannot be excluded. Need to be evaled by Dr. Lajoyce Corners ASAP- will call and schedule appt today. I have also ordered vascular studies to make sure this is not rest pain or claudication. Additionally, I have given him 2 weeks of indomethacin -not for long term use -given his Hypertension and hx of mild Scr elevations. Differential includes-vascular pain, infection, gout, inflammatory arthritis.  25 minutes spent counseling patient. Orders: LE Arterial Doppler/ABI (LE arterial doppler)  Complete Medication List: 1)  Pravastatin Sodium 40 Mg Tabs (Pravastatin sodium) .... Take 1 tablet by mouth once a day 2)  Lisinopril-hydrochlorothiazide 20-25 Mg Tabs (Lisinopril-hydrochlorothiazide) .... Take 1 tablet by mouth once a day 3)  Aspirin 81 Mg Chew (Aspirin) .... Take 1 tablet by mouth once a day 4)  Ativan 0.5 Mg Tabs (Lorazepam) .... Take 1 tablet once daily as needed  for anxiety 5)  Metformin Hcl 500 Mg Tabs (Metformin hcl) .... Take 1 tablet by mouth two times a day 6)  Norvasc 5 Mg Tabs (Amlodipine besylate) .... Take 1 tablet by mouth once a day 7)  Prednisone (pak) 10 Mg Tabs (Prednisone) .... Take 6 tablets by mouth on the first day and taper by 10mg  daily until finish 8)  Anti-hist 25 Mg Caps (Diphenhydramine hcl) .... Take 1 tablet by mouth once a day 9)  Doxycycline Hyclate 100 Mg Solr (Doxycycline hyclate) .... Take 1 tablet by mouth two times a day 10)  Norco 10-325 Mg Tabs (Hydrocodone-acetaminophen) .... Take 1 tablet by mouth two times a day as needed for pain 11)  Indomethacin 25 Mg Caps (Indomethacin) .... Take 1 tablet by mouth three times a day for foot pain for 2 weeks then stop  Patient Instructions: 1)  Follow up appointment next available. 2)  Please take all of your medicine as instructed.  Prescriptions: INDOMETHACIN 25 MG CAPS (INDOMETHACIN) Take 1 tablet by mouth three times a day for foot pain for 2 weeks then stop  #42 x 0   Entered and Authorized by:   Julaine Fusi  DO   Signed by:   Julaine Fusi  DO on 04/26/2010   Method used:   Print then Give to Patient   RxID:   [id number]    Orders Added: 1)  LE Arterial Doppler/ABI [LE arterial doppler] 2)  Est. Patient Level III [16606]

## 2010-07-05 ENCOUNTER — Ambulatory Visit (INDEPENDENT_AMBULATORY_CARE_PROVIDER_SITE_OTHER): Payer: Medicare Other | Admitting: Internal Medicine

## 2010-07-05 ENCOUNTER — Encounter: Payer: Self-pay | Admitting: Internal Medicine

## 2010-07-05 VITALS — BP 138/85 | HR 95 | Temp 97.6°F | Ht 68.0 in | Wt 222.3 lb

## 2010-07-05 DIAGNOSIS — R0602 Shortness of breath: Secondary | ICD-10-CM

## 2010-07-05 DIAGNOSIS — Z23 Encounter for immunization: Secondary | ICD-10-CM

## 2010-07-05 DIAGNOSIS — R0989 Other specified symptoms and signs involving the circulatory and respiratory systems: Secondary | ICD-10-CM

## 2010-07-05 DIAGNOSIS — R0609 Other forms of dyspnea: Secondary | ICD-10-CM

## 2010-07-05 DIAGNOSIS — F528 Other sexual dysfunction not due to a substance or known physiological condition: Secondary | ICD-10-CM

## 2010-07-05 DIAGNOSIS — E785 Hyperlipidemia, unspecified: Secondary | ICD-10-CM

## 2010-07-05 DIAGNOSIS — M8660 Other chronic osteomyelitis, unspecified site: Secondary | ICD-10-CM

## 2010-07-05 DIAGNOSIS — R0600 Dyspnea, unspecified: Secondary | ICD-10-CM

## 2010-07-05 DIAGNOSIS — I10 Essential (primary) hypertension: Secondary | ICD-10-CM

## 2010-07-05 DIAGNOSIS — N529 Male erectile dysfunction, unspecified: Secondary | ICD-10-CM

## 2010-07-05 DIAGNOSIS — E119 Type 2 diabetes mellitus without complications: Secondary | ICD-10-CM

## 2010-07-05 LAB — POCT GLYCOSYLATED HEMOGLOBIN (HGB A1C): Hemoglobin A1C: 6.5

## 2010-07-05 MED ORDER — ASPIRIN 81 MG PO CHEW: 81.0000 mg | CHEWABLE_TABLET | Freq: Every day | ORAL | Status: DC

## 2010-07-05 MED ORDER — LISINOPRIL-HYDROCHLOROTHIAZIDE 20-25 MG PO TABS: 1.0000 | ORAL_TABLET | Freq: Every day | ORAL | Status: DC

## 2010-07-05 MED ORDER — PRAVASTATIN SODIUM 40 MG PO TABS: 40.0000 mg | ORAL_TABLET | Freq: Every day | ORAL | Status: DC

## 2010-07-05 MED ORDER — METFORMIN HCL 500 MG PO TABS: 500.0000 mg | ORAL_TABLET | Freq: Two times a day (BID) | ORAL | Status: DC

## 2010-07-05 NOTE — Assessment & Plan Note (Signed)
We will check lipid profile tomorrow. Continue Pravachol at this time.

## 2010-07-05 NOTE — Patient Instructions (Signed)
Obesity Obesity is defined as having a Body Mass Index (BMI) of 30 or more. To calculate your BMI divide your weight in pounds by your height in inches squared and multiply that product by 703. Major illnesses resulting from long-term obesity include:  Stroke.   Heart disease.   Diabetes.   Many cancers.   Arthritis.  Obesity also complicates recovery from many other medical problems.  CAUSES  A history of obesity in your parents.   Thyroid hormone imbalance.   Environmental factors such as excess calorie intake and physical inactivity.  TREATMENT A healthy weight loss program includes:  A calorie restricted diet based on individual calorie needs.   Increased physical activity (exercise).  An exercise program is just as important as the right low calorie diet.  Weight-loss medicines should be used only under the supervision of your physician. These medicines help, but only if they are used with diet and exercise programs. Medicines can have side effects including nervousness, nausea, abdominal pain, diarrhea, headache, drowsiness, and depression.  An unhealthy weight loss program includes:  Fasting.   Fad diets.   Supplements and drugs.  These choices do not succeed in long-term weight control.  HOME CARE INSTRUCTIONS To help you make the needed dietary changes:   Keep a daily record of everything you eat. There are many free websites to help you with this. It may be helpful to measure your foods so you can determine if you are eating the correct portion sizes.   Use low-calorie cookbooks or take special cooking classes.   Avoid alcohol. Drink more water and drinks with no calories.   Take vitamins and supplements only as recommended by your caregiver.   Weight-loss support groups, Registered Dieticians, counselors, and stress reduction education can also be very helpful.  PREVENTION Losing weight and keeping it off takes time, discipline, a healthy diet and regular  exercise. Document Released: 06/02/2004 Document Re-Released: 05/15/2007 Exit[employer] Patient Information 2011 [employer]. 

## 2010-07-05 NOTE — Assessment & Plan Note (Addendum)
I will continue the patient on metformin 500 mg by mouth twice a day at this point of time.  Patient's foot exam was conducted which was normal.  Patient's blood pressures in good control.  patient's lipid profile would be checked tomorrow morning and I would refill his Pravachol today goal at LDL would be less than 70.  patient is already on an ACE inhibitor.

## 2010-07-05 NOTE — Assessment & Plan Note (Signed)
Patient's blood pressure is well controlled at this point of time on current medications. I would check BMET to check for potassium and creatinine.

## 2010-07-05 NOTE — Assessment & Plan Note (Signed)
Patient is following with Dr. Lajoyce Corners. Dr. Lajoyce Corners is taking care of the pain medications.

## 2010-07-05 NOTE — Progress Notes (Signed)
  Subjective:    Patient ID: Daniel Perkins, male    DOB: 02-07-1956, 55 y.o.   MRN: [id number]  HPI patient is a 55 year old man with past medical history as described in the EMR.   patient comes in today for regular followup.   patient's probable one is diabetes the patient's HbA1c today 6.5 and his CBG is 138 patient has been compliant with his metformin which is 500 mg twice a day I would continue the patient on the current medications.      next problem is hyperlipidemia patient hasn't really been compliant with the his medications the last lipid profile done was in July 2011.  I would repeat lipid profile on him and refill his medications today.   Non-organic erectile dysfunction: I would refer him to a urologist today.    Hypertension:  Patient's blood pressure is 138/85 today. The patient hasn't really been compliant with both of his medications. I did not take his medication this morning. I stressed upon the importance of cutting down on disorders and starting to exercise. Patient is determined to lose weight. I would refill his medications and continue to follow.    osteomyelitis chronic , followed by Dr. Lajoyce Corners , patient was advised to continue following up with Dr.Duda.   Dyspnea. Patient is in complaining of increased shortness of breath especially with exertion last couple of weeks patient states that he has to rest for a few minutes to catch his breath last stress test was done a few years ago which was negative patient is due for another stress test I would refer him to a cardiologist today.    no other complaints at this time.    Review of Systems   10 point review of system was discussed with the patient no other medical problems other than the one state that are discussed above.     Objective:   Physical Exam  Constitutional: He is oriented to person, place, and time. He appears well-developed and well-nourished.  Eyes: Pupils are equal, round, and reactive to light.  Right eye exhibits no discharge.  Neck: Normal range of motion. Neck supple. No thyromegaly present.  Cardiovascular: Normal rate, regular rhythm and normal heart sounds.  Exam reveals no gallop and no friction rub.   No murmur heard. Pulmonary/Chest: Breath sounds normal. He has no wheezes. He has no rales. He exhibits no tenderness.  Abdominal: Soft. Bowel sounds are normal.  Genitourinary: Penis normal.  Musculoskeletal: Normal range of motion.  Lymphadenopathy:    He has no cervical adenopathy.  Neurological: He is alert and oriented to person, place, and time.  Skin: Skin is warm and dry.          Assessment & Plan:

## 2010-07-05 NOTE — Assessment & Plan Note (Signed)
Patient has been complaining of increased shortness of breath since last few days. He did complain specifically of 2 episodes where he had to stop and rest for a while to catch his breath. Patient had a negative stress test several years ago but hasn't had any cardiology workup. I would refer him for a stress test at this time.

## 2010-07-05 NOTE — Assessment & Plan Note (Signed)
Patient was referred to urology outpatient evaluation of erectile dysfunction.

## 2010-07-06 ENCOUNTER — Other Ambulatory Visit: Payer: Medicare Other

## 2010-07-06 DIAGNOSIS — E119 Type 2 diabetes mellitus without complications: Secondary | ICD-10-CM

## 2010-07-06 LAB — LIPID PANEL
Cholesterol: 233 mg/dL — ABNORMAL HIGH (ref 0–200)
LDL Cholesterol: 172 mg/dL — ABNORMAL HIGH (ref 0–99)
Total CHOL/HDL Ratio: 5.5 Ratio
VLDL: 19 mg/dL (ref 0–40)

## 2010-07-06 LAB — BASIC METABOLIC PANEL
CO2: 27 mEq/L (ref 19–32)
Chloride: 103 mEq/L (ref 96–112)
Glucose, Bld: 102 mg/dL — ABNORMAL HIGH (ref 70–99)
Potassium: 5.5 mEq/L — ABNORMAL HIGH (ref 3.5–5.3)
Sodium: 137 mEq/L (ref 135–145)

## 2010-07-07 ENCOUNTER — Telehealth: Payer: Self-pay | Admitting: Internal Medicine

## 2010-07-07 ENCOUNTER — Other Ambulatory Visit: Payer: Medicare Other

## 2010-07-07 DIAGNOSIS — E785 Hyperlipidemia, unspecified: Secondary | ICD-10-CM

## 2010-07-07 DIAGNOSIS — R0600 Dyspnea, unspecified: Secondary | ICD-10-CM

## 2010-07-07 LAB — BASIC METABOLIC PANEL
BUN: 18 mg/dL (ref 6–23)
Potassium: 4.1 mEq/L (ref 3.5–5.3)

## 2010-07-07 MED ORDER — PRAVASTATIN SODIUM 40 MG PO TABS: 80.0000 mg | ORAL_TABLET | Freq: Every day | ORAL | Status: DC

## 2010-07-07 NOTE — Telephone Encounter (Signed)
Patient was advised to take 2 40mg  tabs of pravastatin. He was not compliant with any of his meds and got all his meds refilled just 2 days ago. Patient was also told about his K being high at 5.5 and I have called him for a bmet today afternoon. Will follow up.

## 2010-07-12 ENCOUNTER — Encounter: Payer: Self-pay | Admitting: Cardiovascular Disease

## 2010-07-23 LAB — URINALYSIS, ROUTINE W REFLEX MICROSCOPIC
Bilirubin Urine: NEGATIVE
Glucose, UA: 100 mg/dL — AB
Hgb urine dipstick: NEGATIVE
Protein, ur: NEGATIVE mg/dL
Urobilinogen, UA: 1 mg/dL (ref 0.0–1.0)

## 2010-07-23 LAB — GLUCOSE, CAPILLARY: Glucose-Capillary: 110 mg/dL — ABNORMAL HIGH (ref 70–99)

## 2010-07-25 ENCOUNTER — Encounter: Payer: Self-pay | Admitting: Internal Medicine

## 2010-07-28 LAB — POCT I-STAT, CHEM 8
BUN: 16 mg/dL (ref 6–23)
Hemoglobin: 12.9 g/dL — ABNORMAL LOW (ref 13.0–17.0)
Sodium: 138 mEq/L (ref 135–145)
TCO2: 24 mmol/L (ref 0–100)

## 2010-07-29 ENCOUNTER — Encounter: Payer: Self-pay | Admitting: Cardiovascular Disease

## 2010-07-29 ENCOUNTER — Encounter: Payer: Self-pay | Admitting: Internal Medicine

## 2010-07-29 ENCOUNTER — Ambulatory Visit (INDEPENDENT_AMBULATORY_CARE_PROVIDER_SITE_OTHER): Payer: Medicare Other | Admitting: Cardiovascular Disease

## 2010-07-29 DIAGNOSIS — R079 Chest pain, unspecified: Secondary | ICD-10-CM

## 2010-07-29 NOTE — Patient Instructions (Signed)
Your physician recommends that you schedule a follow-up appointment in: 3-4 weeks with Dr. Clifton James. Your physician has requested that you have an echocardiogram. Echocardiography is a painless test that uses sound waves to create images of your heart. It provides your doctor with information about the size and shape of your heart and how well your heart's chambers and valves are working. This procedure takes approximately one hour. There are no restrictions for this procedure.  Your physician has requested that you have en exercise stress myoview. For further information please visit [email or web address]. Please follow instruction sheet, as given.

## 2010-07-29 NOTE — Progress Notes (Signed)
History of Present Illness: 55 yo AAM with history of mild non-obstructive CAD, DM,  Hyperlipidemia, HTN, Gout who is here today to establish cardiac care. He has been followed in the past in the outpatient [facility]idents clinic at [facility]. He tells me that he has episodes of feeling breathless. There is associated burning in the chest. Associated dizziness. This lasts for 30-40 seconds. Resolves with rest. He had a cardiac cath in April 2011 with mild 20% stenosis in the obtuse marginal but no other disease. These episodes have been occurring almost every day. No associated palpitations. He feels awful when this happens.    Past Medical History  Diagnosis Date  . Coronary artery disease   . Gout   . Hyperlipidemia   . Hypertension   . Osteomyelitis   . CTEV (congenital talipes equinovarus)     Past Surgical History  Procedure Date  . Neck surgery   . Foot surgery     Current Outpatient Prescriptions  Medication Sig Dispense Refill  . aspirin 81 MG chewable tablet Chew 1 tablet (81 mg total) by mouth daily.  30 tablet  11  . HYDROcodone-acetaminophen (NORCO) 10-325 MG per tablet Take 1 tablet by mouth 2 (two) times daily as needed. For pain       . lisinopril-hydrochlorothiazide (PRINZIDE,ZESTORETIC) 20-25 MG per tablet Take 1 tablet by mouth daily.  30 tablet  11  . metFORMIN (GLUCOPHAGE) 500 MG tablet Take 1 tablet (500 mg total) by mouth 2 (two) times daily.  60 tablet  11  . pravastatin (PRAVACHOL) 40 MG tablet Take 2 tablets (80 mg total) by mouth daily.  60 tablet  11  . amLODipine (NORVASC) 5 MG tablet Take 5 mg by mouth daily.        . diphenhydrAMINE (BENADRYL) 25 mg capsule Take 25 mg by mouth daily. 1 TAB DAILY       . doxycycline (DORYX) 100 MG EC tablet Take 100 mg by mouth 2 (two) times daily.        Marland Kitchen LORazepam (ATIVAN) 0.5 MG tablet Take 0.5 mg by mouth daily. 1 TAB QD PRN FOR ANXIETY       . pravastatin (PRAVACHOL) 40 MG tablet Take 40 mg by mouth daily.        .  predniSONE (DELTASONE) 10 MG tablet pack Take 10 mg by mouth daily. TAKE 6 TABLETS BY MOUTH OF THE FIRST DAY AND TAPER BY 10 MG DAILY UNTIL FINISHED         Allergies  Allergen Reactions  . Morphine     REACTION: MAKES PATIENT ITCH    History   Social History  . Marital Status: Married    Spouse Name: N/A    Number of Children: N/A  . Years of Education: N/A   Occupational History  . Not on file.   Social History Main Topics  . Smoking status: Former Smoker    Quit date: 07/06/2007  . Smokeless tobacco: Not on file  . Alcohol Use: Yes  . Drug Use: Not on file  . Sexually Active: Not on file   Other Topics Concern  . Not on file   Social History Narrative  . No narrative on file    Family History  Problem Relation Age of Onset  . Diabetes Mother   . Coronary artery disease Mother     HAS PACEMAKER    Review of Systems: No chest pain. He does describe SOB. No  palpitations, dizziness,  near syncope or  syncope.  No PND, orthopnea, or Lower extremity edema.   BP 162/100  Pulse 91  Wt 225 lb 12.8 oz (102.422 kg)  Physical Examination: General: Well developed, well nourished, NAD HEENT: OP clear, mucus membranes moist SKIN: warm, dry Neuro: No focal deficits Musculoskeletal: Muscle strength 5/5 all ext Psychiatric: Mood and affect normal Neck: No JVD, no carotid bruits, no thyromegaly, no lymphadenopathy. Lungs:Clear bilaterally, no wheezes, rhonci, crackles  EKG:NSR, rate 91 bpm. LAD. LVH. Non-specific T wave changes.

## 2010-07-29 NOTE — Assessment & Plan Note (Signed)
He is a poor historian but he is describing exertional chest pain with associated dyspnea, dizziness. Will arrange exercise myoview and echo.

## 2010-08-01 LAB — LIPID PANEL
Cholesterol: 149 mg/dL (ref 0–200)
LDL Cholesterol: 104 mg/dL — ABNORMAL HIGH (ref 0–99)
Triglycerides: 126 mg/dL (ref <150)
VLDL: 25 mg/dL (ref 0–40)

## 2010-08-01 LAB — CBC
HCT: 36.8 % — ABNORMAL LOW (ref 39.0–52.0)
Hemoglobin: 11.1 g/dL — ABNORMAL LOW (ref 13.0–17.0)
Hemoglobin: 12.3 g/dL — ABNORMAL LOW (ref 13.0–17.0)
Hemoglobin: 12.3 g/dL — ABNORMAL LOW (ref 13.0–17.0)
MCHC: 31.4 g/dL (ref 30.0–36.0)
MCHC: 32.2 g/dL (ref 30.0–36.0)
MCV: 69.7 fL — ABNORMAL LOW (ref 78.0–100.0)
Platelets: 335 10*3/uL (ref 150–400)
Platelets: 337 10*3/uL (ref 150–400)
RBC: 4.98 MIL/uL (ref 4.22–5.81)
RBC: 5.48 MIL/uL (ref 4.22–5.81)
RDW: 14.6 % (ref 11.5–15.5)
RDW: 14.6 % (ref 11.5–15.5)
RDW: 14.8 % (ref 11.5–15.5)
RDW: 14.9 % (ref 11.5–15.5)

## 2010-08-01 LAB — BASIC METABOLIC PANEL
BUN: 19 mg/dL (ref 6–23)
CO2: 27 mEq/L (ref 19–32)
CO2: 27 mEq/L (ref 19–32)
Calcium: 9 mg/dL (ref 8.4–10.5)
Calcium: 9.5 mg/dL (ref 8.4–10.5)
Chloride: 102 mEq/L (ref 96–112)
Creatinine, Ser: 1.18 mg/dL (ref 0.4–1.5)
Creatinine, Ser: 1.18 mg/dL (ref 0.4–1.5)
Creatinine, Ser: 1.2 mg/dL (ref 0.4–1.5)
GFR calc Af Amer: >60 mL/min (ref >60)
GFR calc Af Amer: >60 mL/min (ref >60)
GFR calc non Af Amer: >60 mL/min (ref >60)
GFR calc non Af Amer: >60 mL/min (ref >60)
GFR calc non Af Amer: >60 mL/min (ref >60)
Glucose, Bld: 109 mg/dL — ABNORMAL HIGH (ref 70–99)
Glucose, Bld: 112 mg/dL — ABNORMAL HIGH (ref 70–99)
Glucose, Bld: 124 mg/dL — ABNORMAL HIGH (ref 70–99)
Potassium: 4.5 mEq/L (ref 3.5–5.1)
Sodium: 135 mEq/L (ref 135–145)
Sodium: 139 mEq/L (ref 135–145)

## 2010-08-01 LAB — GLUCOSE, CAPILLARY
Glucose-Capillary: 162 mg/dL — ABNORMAL HIGH (ref 70–99)
Glucose-Capillary: 96 mg/dL (ref 70–99)

## 2010-08-01 LAB — BRAIN NATRIURETIC PEPTIDE: Pro B Natriuretic peptide (BNP): <30 pg/mL (ref 0.0–100.0)

## 2010-08-01 LAB — HEPATIC FUNCTION PANEL
ALT: 23 U/L (ref 0–53)
Alkaline Phosphatase: 128 U/L — ABNORMAL HIGH (ref 39–117)
Bilirubin, Direct: <0.1 mg/dL (ref 0.0–0.3)
Total Bilirubin: 0.7 mg/dL (ref 0.3–1.2)

## 2010-08-01 LAB — CARDIAC PANEL(CRET KIN+CKTOT+MB+TROPI)
CK, MB: 1.6 ng/mL (ref 0.3–4.0)
CK, MB: 1.6 ng/mL (ref 0.3–4.0)
Relative Index: 0.5 (ref 0.0–2.5)
Relative Index: 0.6 (ref 0.0–2.5)
Total CK: 255 U/L — ABNORMAL HIGH (ref 7–232)
Troponin I: 0.01 ng/mL (ref 0.00–0.06)

## 2010-08-01 LAB — HEPARIN LEVEL (UNFRACTIONATED)
Heparin Unfractionated: 0.61 IU/mL (ref 0.30–0.70)
Heparin Unfractionated: 0.76 IU/mL — ABNORMAL HIGH (ref 0.30–0.70)

## 2010-08-01 LAB — VITAMIN B12: Vitamin B-12: 361 pg/mL (ref 211–911)

## 2010-08-11 ENCOUNTER — Ambulatory Visit (HOSPITAL_COMMUNITY): Payer: Medicare Other | Attending: Cardiovascular Disease | Admitting: Radiology

## 2010-08-11 ENCOUNTER — Ambulatory Visit (HOSPITAL_BASED_OUTPATIENT_CLINIC_OR_DEPARTMENT_OTHER): Payer: Medicare Other | Admitting: Radiology

## 2010-08-11 DIAGNOSIS — R079 Chest pain, unspecified: Secondary | ICD-10-CM

## 2010-08-11 DIAGNOSIS — R0609 Other forms of dyspnea: Secondary | ICD-10-CM

## 2010-08-11 DIAGNOSIS — I2510 Atherosclerotic heart disease of native coronary artery without angina pectoris: Secondary | ICD-10-CM

## 2010-08-11 DIAGNOSIS — E119 Type 2 diabetes mellitus without complications: Secondary | ICD-10-CM

## 2010-08-11 DIAGNOSIS — R0989 Other specified symptoms and signs involving the circulatory and respiratory systems: Secondary | ICD-10-CM

## 2010-08-11 DIAGNOSIS — R0602 Shortness of breath: Secondary | ICD-10-CM

## 2010-08-11 MED ORDER — REGADENOSON 0.4 MG/5ML IV SOLN
0.4000 mg | Freq: Once | INTRAVENOUS | Status: AC
  Administered 2010-08-11: 0.4 mg via INTRAVENOUS

## 2010-08-11 MED ORDER — TECHNETIUM TC 99M TETROFOSMIN IV KIT
33.0000 | PACK | Freq: Once | INTRAVENOUS | Status: AC | PRN
  Administered 2010-08-11: 33 via INTRAVENOUS

## 2010-08-11 MED ORDER — TECHNETIUM TC 99M TETROFOSMIN IV KIT
11.0000 | PACK | Freq: Once | INTRAVENOUS | Status: AC | PRN
  Administered 2010-08-11: 11 via INTRAVENOUS

## 2010-08-11 NOTE — Progress Notes (Signed)
[facility] SITE 3 NUCLEAR MED [address] [place] [address] [phone]  Cardiology Nuclear Med Study  Daniel Perkins is a 55 y.o. male [id number] 02-Nov-1955   Nuclear Med Background Indication for Stress Test:  Evaluation for Ischemia History:  3/06 Echo: EF=55-65%;5 yrs ago mild MI per patient;411 Cath: N/O CAD Cardiac Risk Factors: Family History - CAD, History of Smoking, Hypertension, Lipids and NIDDM  Symptoms:  Chest Pain with Exertion (last date of chest discomfort 2 days ago), Dizziness, DOE, Fatigue, Light-Headedness, Near Syncope, Rapid HR and SOB   Nuclear Pre-Procedure Caffeine/Decaff Intake:  None NPO After: 12:00am   Lungs:  Clear IV 0.9% NS with Angio Cath:  22g  IV Site: R Wrist  IV Started by:  Irean Hong, RN  Chest Size (in):  46 Cup Size: n/a  Height: 5\' 8"  (1.727 m)  Weight:  210 lb (95.255 kg)  BMI:  Body mass index is 31.93 kg/(m^2). Tech Comments:  Held metformin this am.The patient unable to walk fast on the treadmill due to feet, legs, and back pain; changed to [employer]. [name],RN    Nuclear Med Study 1 or 2 day study: 1 day  Stress Test Type:  Treadmill/Lexiscan  Reading MD: Willa Rough, MD  Order Authorizing Provider:  Dr. Earney Hamburg, MD  Resting Radionuclide: Technetium 30m Tetrofosmin  Resting Radionuclide Dose: 11 mCi   Stress Radionuclide:  Technetium 60m Tetrofosmin  Stress Radionuclide Dose: 33 mCi           Stress Protocol Rest HR: 75 Stress HR: 141  Rest BP: 126/84 Stress BP: 140/100  Exercise Time (min): one minute METS: n/a   Predicted Max HR: 167 bpm % Max HR: 84.43 bpm Rate Pressure Product: 32951   Dose of Adenosine (mg):  n/a Dose of Lexiscan: 0.4 mg  Dose of Atropine (mg): n/a Dose of Dobutamine: n/a mcg/kg/min (at max HR)  Stress Test Technologist: Irean Hong, RN  Nuclear Technologist:  Domenic Polite, CNMT     Rest Procedure:  Myocardial perfusion imaging was performed at  rest 45 minutes following the intravenous administration of Technetium 58m Tetrofosmin. Rest ECG: NSR and Nonspecific T wave changes  Stress Procedure:  The patient received IV Lexiscan 0.4 mg over 15-seconds with concurrent low level exercise. Technetium 2m Tetrofosmin was injected at 30-seconds while the patient continued walking.  There were no significant changes with Lexiscan. The patient complained of DOE and a chest "rush" with warmth after Lexiscan.  Quantitative spect images were obtained after a 45-minute delay. Stress ECG: No significant ST segment change suggestive of ischemia.  QPS Raw Data Images:  Patient motion noted; appropriate software correction applied. Stress Images:  There is mild decrease in activity at the base of the inferior wall. Rest Images:  Same as stress Subtraction (SDS):  No evidence of ischemia. Transient Ischemic Dilatation (Normal <1.22):  0.88 Lung/Heart Ratio (Normal <0.45):  0.33  Quantitative Gated Spect Images QGS EDV:  64 ml QGS ESV:  25 ml QGS cine images:  Normal Wall Motion QGS EF: 61%  Impression Exercise Capacity:  Lexiscan with low level exercise. BP Response:  Normal blood pressure response. Clinical Symptoms:  Chest tight ECG Impression:  No significant ST segment change suggestive of ischemia. Comparison with Prior Nuclear Study: No previous nuclear study performed  Overall Impression:  There is no ischemia. There is normal wall motion. There is decreased activity at the immediate base of the  inferior wall. This is fixed. This could  be from the shape of the LV . The quantitative analysis does not recognize this finding.      Willa Rough

## 2010-08-12 ENCOUNTER — Telehealth: Payer: Self-pay | Admitting: Cardiology

## 2010-08-12 NOTE — Telephone Encounter (Signed)
Patient is aware of his echo and stress test results. He will f/u with Dr. Clifton James on 08/18/10.

## 2010-08-12 NOTE — Progress Notes (Signed)
REPORT ROUTED TO DR. Clifton James.Daniel Perkins

## 2010-08-12 NOTE — Progress Notes (Signed)
Normal stress test. No ischemia. Can we let him know? Thanks, chris

## 2010-08-12 NOTE — Progress Notes (Signed)
Patient is aware of stress test results.

## 2010-08-14 LAB — POCT I-STAT, CHEM 8
Calcium, Ion: 1.15 mmol/L (ref 1.12–1.32)
Chloride: 109 mEq/L (ref 96–112)
Glucose, Bld: 130 mg/dL — ABNORMAL HIGH (ref 70–99)
HCT: 39 % (ref 39.0–52.0)
TCO2: 23 mmol/L (ref 0–100)

## 2010-08-14 LAB — CBC
HCT: 36.8 % — ABNORMAL LOW (ref 39.0–52.0)
Hemoglobin: 11.9 g/dL — ABNORMAL LOW (ref 13.0–17.0)
MCHC: 32.2 g/dL (ref 30.0–36.0)
MCV: 70.8 fL — ABNORMAL LOW (ref 78.0–100.0)
RBC: 5.21 MIL/uL (ref 4.22–5.81)
RDW: 15.4 % (ref 11.5–15.5)

## 2010-08-14 LAB — DIFFERENTIAL
Basophils Absolute: 0.1 10*3/uL (ref 0.0–0.1)
Basophils Relative: 1 % (ref 0–1)
Eosinophils Relative: 2 % (ref 0–5)
Monocytes Absolute: 0.7 10*3/uL (ref 0.1–1.0)
Monocytes Relative: 10 % (ref 3–12)
Neutro Abs: 4 10*3/uL (ref 1.7–7.7)

## 2010-08-14 LAB — WOUND CULTURE: Culture: NO GROWTH

## 2010-08-14 LAB — ANAEROBIC CULTURE

## 2010-08-15 LAB — DIFFERENTIAL
Basophils Absolute: 0 10*3/uL (ref 0.0–0.1)
Eosinophils Absolute: 0.1 10*3/uL (ref 0.0–0.7)
Lymphocytes Relative: 25 % (ref 12–46)
Monocytes Relative: 12 % (ref 3–12)
Neutrophils Relative %: 61 % (ref 43–77)

## 2010-08-15 LAB — PROTIME-INR
INR: 1 (ref 0.00–1.49)
Prothrombin Time: 13.5 seconds (ref 11.6–15.2)

## 2010-08-15 LAB — CBC
MCHC: 32.5 g/dL (ref 30.0–36.0)
RDW: 16.2 % — ABNORMAL HIGH (ref 11.5–15.5)

## 2010-08-15 LAB — BASIC METABOLIC PANEL
CO2: 26 mEq/L (ref 19–32)
Calcium: 9.2 mg/dL (ref 8.4–10.5)
Creatinine, Ser: 1.18 mg/dL (ref 0.4–1.5)
GFR calc Af Amer: >60 mL/min (ref >60)
GFR calc non Af Amer: >60 mL/min (ref >60)
Glucose, Bld: 125 mg/dL — ABNORMAL HIGH (ref 70–99)

## 2010-08-17 LAB — CK
Total CK: 497 U/L — ABNORMAL HIGH (ref 7–232)
Total CK: 619 U/L — ABNORMAL HIGH (ref 7–232)

## 2010-08-17 LAB — POCT I-STAT, CHEM 8
Glucose, Bld: 157 mg/dL — ABNORMAL HIGH (ref 70–99)
HCT: 40 % (ref 39.0–52.0)
Hemoglobin: 13.6 g/dL (ref 13.0–17.0)
Potassium: 3.8 mEq/L (ref 3.5–5.1)
Sodium: 136 mEq/L (ref 135–145)

## 2010-08-17 LAB — TROPONIN I: Troponin I: 0.02 ng/mL (ref 0.00–0.06)

## 2010-08-17 LAB — HEPATIC FUNCTION PANEL: Total Protein: 6.6 g/dL (ref 6.0–8.3)

## 2010-08-18 ENCOUNTER — Ambulatory Visit (INDEPENDENT_AMBULATORY_CARE_PROVIDER_SITE_OTHER): Payer: Medicare Other | Admitting: Cardiovascular Disease

## 2010-08-18 ENCOUNTER — Encounter: Payer: Self-pay | Admitting: Cardiovascular Disease

## 2010-08-18 VITALS — BP 126/86 | HR 88 | Resp 18 | Ht 68.0 in | Wt 220.0 lb

## 2010-08-18 DIAGNOSIS — R0602 Shortness of breath: Secondary | ICD-10-CM | POA: Insufficient documentation

## 2010-08-18 DIAGNOSIS — I10 Essential (primary) hypertension: Secondary | ICD-10-CM

## 2010-08-18 DIAGNOSIS — I2510 Atherosclerotic heart disease of native coronary artery without angina pectoris: Secondary | ICD-10-CM

## 2010-08-18 NOTE — Patient Instructions (Signed)
Your physician recommends that you schedule a follow-up appointment in: 1 year with Dr. McAlhany   

## 2010-08-18 NOTE — Assessment & Plan Note (Addendum)
Stable. Mild CAD by cath 4/11. Stress test without ischemia. No further workup at this time. Will start ASA 81 mg po Qdaily.

## 2010-08-18 NOTE — Progress Notes (Signed)
History of Present Illness:55 yo AAM with history of mild non-obstructive CAD, DM, Hyperlipidemia, HTN, Gout who is here todayfor follow up. I saw him one month ago  to establish cardiac care. He has been followed in the past in the outpatient [facility] clinic at [facility]. He told me that he has episodes of feeling breathless. There is associated burning in the chest. Associated dizziness. This lasts for 30-40 seconds. Resolves with rest. He had a cardiac cath in April 2011 with mild 20% stenosis in the obtuse marginal but no other disease. These episodes have been occurring almost every day. No associated palpitations. He feels awful when this happens. I ordered a stress myoview and an echo. His echo showed diastolic dysfunction with normal LV systolic function, mild AI. His stress myoview showed LVEF of 61% with no ischemia.   He has been feeling well. No recurrent chest pain since last visit.    Past Medical History  Diagnosis Date  . Coronary artery disease   . Gout   . Hyperlipidemia   . Hypertension   . Osteomyelitis   . CTEV (congenital talipes equinovarus)     Past Surgical History  Procedure Date  . Neck surgery   . Foot surgery     Current Outpatient Prescriptions  Medication Sig Dispense Refill  . lisinopril-hydrochlorothiazide (PRINZIDE,ZESTORETIC) 20-25 MG per tablet Take 1 tablet by mouth daily.  30 tablet  11  . metFORMIN (GLUCOPHAGE) 500 MG tablet Take 1 tablet (500 mg total) by mouth 2 (two) times daily.  60 tablet  11  . pravastatin (PRAVACHOL) 40 MG tablet Take 40 mg by mouth daily.        Marland Kitchen DISCONTD: pravastatin (PRAVACHOL) 40 MG tablet Take 2 tablets (80 mg total) by mouth daily.  60 tablet  11  . aspirin 81 MG chewable tablet Chew 1 tablet (81 mg total) by mouth daily.  30 tablet  11  . DISCONTD: amLODipine (NORVASC) 5 MG tablet Take 5 mg by mouth daily.        Marland Kitchen DISCONTD: diphenhydrAMINE (BENADRYL) 25 mg capsule Take 25 mg by mouth daily. 1 TAB DAILY       .  DISCONTD: doxycycline (DORYX) 100 MG EC tablet Take 100 mg by mouth 2 (two) times daily.        Marland Kitchen DISCONTD: HYDROcodone-acetaminophen (NORCO) 10-325 MG per tablet Take 1 tablet by mouth 2 (two) times daily as needed. For pain       . DISCONTD: LORazepam (ATIVAN) 0.5 MG tablet Take 0.5 mg by mouth daily. 1 TAB QD PRN FOR ANXIETY       . DISCONTD: pravastatin (PRAVACHOL) 40 MG tablet Take 40 mg by mouth daily.        Marland Kitchen DISCONTD: predniSONE (DELTASONE) 10 MG tablet pack Take 10 mg by mouth daily. TAKE 6 TABLETS BY MOUTH OF THE FIRST DAY AND TAPER BY 10 MG DAILY UNTIL FINISHED         Allergies  Allergen Reactions  . Morphine     REACTION: MAKES PATIENT ITCH    History   Social History  . Marital Status: Married    Spouse Name: N/A    Number of Children: N/A  . Years of Education: N/A   Occupational History  . Not on file.   Social History Main Topics  . Smoking status: Former Smoker    Quit date: 07/06/2007  . Smokeless tobacco: Not on file  . Alcohol Use: Yes  . Drug Use: Not on  file  . Sexually Active: Not on file   Other Topics Concern  . Not on file   Social History Narrative  . No narrative on file    Family History  Problem Relation Age of Onset  . Diabetes Mother   . Coronary artery disease Mother     HAS PACEMAKER  . Heart disease Mother   . Heart attack Brother     Review of Systems:  As stated in the HPI and otherwise negative.   BP 126/86  Pulse 88  Resp 18  Ht 5\' 8"  (1.727 m)  Wt 220 lb (99.791 kg)  BMI 33.45 kg/m2  Physical Examination: General: Well developed, well nourished, NAD HEENT: OP clear, mucus membranes moist Psychiatric: Mood and affect normal Neck: No JVD, no carotid bruits, no thyromegaly, no lymphadenopathy. Lungs:Clear bilaterally, no wheezes, rhonci, crackles Cardiovascular: Regular rate and rhythm. No murmurs, gallops or rubs. Abdomen:Soft. Bowel sounds present. Non-tender.  Extremities: No lower extremity edema. Pulses are 2  + in the bilateral DP/PT.  Stress myoview: 08/11/10 Stress Procedure: The patient received IV Lexiscan 0.4 mg over 15-seconds with concurrent low level exercise. Technetium 63m Tetrofosmin was injected at 30-seconds while the patient continued walking. There were no significant changes with Lexiscan. The patient complained of DOE and a chest "rush" with warmth after Lexiscan. Quantitative spect images were obtained after a 45-minute delay.  Stress ECG: No significant ST segment change suggestive of ischemia.  QPS  Raw Data Images: Patient motion noted; appropriate software correction applied.  Stress Images: There is mild decrease in activity at the base of the inferior wall.  Rest Images: Same as stress  Subtraction (SDS): No evidence of ischemia.  Transient Ischemic Dilatation (Normal <1.22): 0.88  Lung/Heart Ratio (Normal <0.45): 0.33  Quantitative Gated Spect Images  QGS EDV: 64 ml  QGS ESV: 25 ml  QGS cine images: Normal Wall Motion  QGS EF: 61%  Impression  Exercise Capacity: Lexiscan with low level exercise.  BP Response: Normal blood pressure response.  Clinical Symptoms: Chest tight  ECG Impression: No significant ST segment change suggestive of ischemia.  Comparison with Prior Nuclear Study: No previous nuclear study performed  Overall Impression: There is no ischemia. There is normal wall motion. There is decreased activity at the immediate base of the inferior wall. This is fixed. This could be from the shape of the LV . The quantitative analysis does not recognize this finding.  Echo: 08/11/10 - Left ventricle: There may be some diastolic dysfunction. There is upper septal thickening, but no SAM and no LVOT gradient. The cavity size was normal. Wall thickness was increased in a pattern of mild LVH. The estimated ejection fraction was 65%. Wall motion was normal; there were no regional wall motion abnormalities. - Aortic valve: Mild regurgitation.

## 2010-08-18 NOTE — Assessment & Plan Note (Signed)
BP well controlled. No changes.  

## 2010-08-27 ENCOUNTER — Ambulatory Visit: Payer: Medicare Other | Admitting: Cardiology

## 2010-09-21 NOTE — Op Note (Signed)
NAME:  Daniel Perkins, Daniel Perkins NO.:  [id number]   MEDICAL RECORD NO.:  [id number]          PATIENT TYPE:  OIB   LOCATION:  5028                         FACILITY:  [facility]   PHYSICIAN:  Nadara Mustard, MD     DATE OF BIRTH:  Apr 16, 1956   DATE OF PROCEDURE:  07/17/2007  DATE OF DISCHARGE:                               OPERATIVE REPORT   PREOPERATIVE DIAGNOSIS:  Chronic osteomyelitis with abscess of the left  foot.   POSTOPERATIVE DIAGNOSIS:  Chronic osteomyelitis with abscess of the left  foot.   PROCEDURE:  Irrigation and debridement of skin, soft tissue and bone of  the left foot; with placement of antibiotic beads.   SURGEON.:  Nadara Mustard, MD   ANESTHESIA:  General.   ESTIMATED BLOOD LOSS:  Minimal.   ANTIBIOTICS:  Kefzol 1 gram.   DRAINS:  None.   COMPLICATIONS:  None.   TOURNIQUET TIME:  None.   CULTURES OBTAINED:  One.   DISPOSITION:  To PACU in stable condition.   INDICATIONS FOR PROCEDURE:  The patient is a 55 year old gentleman,  status post a triple arthrodesis; who developed an ischemic wound and  subsequent developed osteomyelitis secondary to tobacco use.  The  patient has undergone multiple irrigation debridements with foot salvage  surgery, and was essentially cleared of infection; until recently he  started developing a purulent, odorous draining wound laterally.  The  patient was brought to the OR at this time for surgical intervention.  The risks and benefits were discussed, including persistent infection,  neurovascular injury, nonhealing of the wound, need for additional  surgery.  The patient states he understands and wishes to proceed at  this time.   DESCRIPTION OF PROCEDURE:  The patient was brought to OR room 16 and  underwent a general anesthetic.  After adequate level of anesthesia  obtained, the patient's left lower extremity was prepped using DuraPrep  and draped into a sterile field.  The lateral sinus tarsi wound was  opened.  This was carried down to the fusion site.  A curet and Cobb  were used to debride loose, nonviable bone.  Deep cultures were obtained  one time.  The wound was irrigated with pulse lavage.  After pulse  lavage, further debridement was performed and all loose material was  removed from within the wound.  Antibiotic beads were then fabricated on  the back table with 1 gram of vancomycin and 1.2 grams of tobramycin.  The antibiotic beads were placed deep within the wound.  The wound was  closed using 2-0 nylon.  The wounds were covered with Adaptic orthopedic  sponges, ABD dressing and a Coban dressing.  The patient was extubated,  taken to PACU in stable condition.   PLAN:  1. 24-hr observation.  2. Discharge to home on p.o. Cipro.      Nadara Mustard, MD  Electronically Signed     MVD/MEDQ  D:  07/17/2007  T:  07/18/2007  Job:  [id number]

## 2010-09-21 NOTE — Op Note (Signed)
NAME:  Daniel Perkins, Daniel Perkins NO.:  [id number]   MEDICAL RECORD NO.:  [id number]          PATIENT TYPE:  OIB   LOCATION:  5013                         FACILITY:  [facility]   PHYSICIAN:  Nadara Mustard, MD     DATE OF BIRTH:  [birth year]   DATE OF PROCEDURE:  02/21/2007  DATE OF DISCHARGE:                               OPERATIVE REPORT   PREOPERATIVE DIAGNOSIS:  Chronic osteomyelitis, left foot hind foot.   POSTOPERATIVE DIAGNOSIS:  Chronic osteomyelitis, left foot hind foot.   PROCEDURE:  1. Irrigation and debridement of bone and soft tissue with excision of      the calcaneus, talus, as well as cuneiforms and cuboid, left foot.  2. Removal antibiotic beads.  3. Placement antibiotic beads.   SURGEON:  Nadara Mustard, M.D.   ANESTHESIA:  General.   ESTIMATED BLOOD LOSS:  Minimal.   ANTIBIOTICS:  1 gram of Kefzol preoperatively.  The patient was also on  doxycycline and Cipro preoperatively.   CULTURES OBTAINED:  Two.   TOURNIQUET TIME:  None.   DISPOSITION:  To the PACU in stable condition.   INDICATIONS FOR PROCEDURE:  The patient is a 51-year gentleman with  chronic osteomyelitis of the mid and hind foot of the left foot.  The  patient has undergone multiple irrigation and debridements and presents  at this time for revision surgery.  The risks and benefits were  discussed including infection, neurovascular injury, persistent pain,  non healing of the wound, potential need for amputation.  The patient  states he understands and wished to proceed at this time.   DESCRIPTION OF PROCEDURE:  The patient was brought to OR room 4 and  underwent a general anesthetic.  After an adequate level of anesthesia  was obtained, the patient's left lower extremity was prepped using  DuraPrep and draped into a sterile field.  The previous lateral incision  was used.  This was carried down and the antibiotic beads were removed.  Using osteotomes and curettes, more of the  calcaneus, talus, navicular,  cuneiforms and cuboid bones were excised transversely to the foot.  This  was irrigated with pulse lavage and there was good bleeding viable  tissue.  Cultures were obtained x2.  There was no purulence, no necrotic  tissue from the wound bed.  After irrigation with pulse lavage,  antibiotic beads were made with the 1.2 grams of tobramycin and 1 gram  of vancomycin with Palacos cement. A string of antibiotic beads was  placed within the wound.  The incision was  closed without tension on the skin using 2-0 nylon.  The wound was  covered with Adaptic orthopedic sponges, ABD dressing, Kerlix, and a  Coban dressing.  The patient was extubated and taken to the PACU in  stable condition.  The plan is for admission and discharge to home once  his pain is under control.      Nadara Mustard, MD  Electronically Signed     MVD/MEDQ  D:  02/21/2007  T:  02/21/2007  Job:  [id number]

## 2010-09-21 NOTE — Op Note (Signed)
NAME:  Daniel Perkins, Daniel Perkins NO.:  [id number]   MEDICAL RECORD NO.:  [id number]          PATIENT TYPE:  INP   LOCATION:  2853                         FACILITY:  [facility]   PHYSICIAN:  Nadara Mustard, MD     DATE OF BIRTH:  [birth year]   DATE OF PROCEDURE:  01/10/2007  DATE OF DISCHARGE:                               OPERATIVE REPORT   PREOPERATIVE DIAGNOSIS:  Osteomyelitis and abscess left foot.   POSTOPERATIVE DIAGNOSIS:  Osteomyelitis and abscess left foot.   PROCEDURE:  Irrigation and debridement skin, soft tissue and bone with  excision of bone across the midfoot and hind foot and placement of  antibiotic beads with vancomycin 1 gram and tobramycin 1.2 grams.   SURGEON:  Nadara Mustard, MD.   ANESTHESIA:  General.   ESTIMATED BLOOD LOSS:  Minimal.   ANTIBIOTICS:  Kefzol 1 gram.   DRAINS:  None.   COMPLICATIONS:  None.   TOURNIQUET TIME:  None.   CULTURES:  Obtained x2.   DISPOSITION:  To PACU in stable condition.   INDICATIONS FOR PROCEDURE:  The patient is a 55 year old gentleman  status post multiple surgical procedures for chronic osteomyelitis and  abscess left foot.  The patient has developed recurrent infection in his  foot despite p.o. antibiotics.  He has foul odor with draining fluid and  presents at this time for debridement of infected bone and debridement  of the abscess.  Risks and benefits were discussed including persistent  infection, nonhealing of the wound, need for additional surgery,  potential for below-the-knee amputation.  The patient states he  understands and wished to proceed at this time.   DESCRIPTION OF PROCEDURE:  The patient was brought to OR room 10 and  underwent a general anesthetic.  After adequate level of anesthesia  obtained, the patient's left lower extremity was prepped using DuraPrep  and draped into a sterile field.  The previous oblique incision across  the sinus tarsi was used.  Sharp dissection was carried  down to the  sinus tarsi.  The Cobb elevator was used to elevate the soft tissue off  the bone.  The bone was debrided from within the hind foot and mid foot.  This was debrided back to bleeding viable bony edges.  There was no  purulence, however, there was a foul-smelling odor, deep cultures were  obtained.  The wound was irrigated with pulse lavage, antibiotic beads  were made on the back table using Palacos cement, 1 gram of vancomycin,  1.2 grams of tobramycin.  These beads were placed on a #1 Prolene and  were placed in the wound.  There were 30 beads placed within the wound.  The wound was closed using a 2-0 nylon.  This was closed loosely to  allow for drainage.  The wound was covered with Adaptic orthopedic  sponges, ABD dressing, Webril and a Coban dressing.  The patient was  extubated and taken to the PACU in stable condition.  Plan for  restarting vancomycin, final antibiotic coverage per culture results and  discharge once cultures are finalized.      Nadara Mustard,  MD  Electronically Signed     MVD/MEDQ  D:  01/10/2007  T:  01/10/2007  Job:  [id number]

## 2010-09-21 NOTE — Op Note (Signed)
NAME:  PRATHER, FAILLA NO.:  [id number]   MEDICAL RECORD NO.:  [id number]          PATIENT TYPE:  OIB   LOCATION:  5014                         FACILITY:  [facility]   PHYSICIAN:  Nadara Mustard, MD     DATE OF BIRTH:  Jan 23, 1956   DATE OF PROCEDURE:  08/22/2007  DATE OF DISCHARGE:                               OPERATIVE REPORT   PREOPERATIVE DIAGNOSIS:  Chronic osteomyelitis, left foot.   POSTOPERATIVE DIAGNOSIS:  Chronic osteomyelitis, left foot.   PROCEDURE:  1. Irrigation and debridement left foot of skin, soft tissue, and      bone.  2. Removal of antibiotic beads.  3. Replacement of new antibiotic beads.   SURGEON:  Nadara Mustard, MD   ANESTHESIA:  General.   ESTIMATED BLOOD LOSS:  Minimal.   ANTIBIOTICS:  1 g of Kefzol.   DRAINS:  None.   COMPLICATIONS:  None.   TOURNIQUET TIME:  None.   DISPOSITION:  To PACU in stable condition.   INDICATIONS FOR PROCEDURE:  The patient is a 55 year old gentleman with  chronic osteomyelitis of his left foot.  He presents at this time for  repeat irrigation and debridement and removal of the antibiotic beads.  Risk and benefits were discussed with the patient including infection,  neurovascular injury, persistent pain, and need for distal surgery.  The  patient states that he understands and wish to proceed at this time.   DESCRIPTION OF PROCEDURE:  The patient was brought to OR 4, and  underwent a general anesthetic.  After adequate level of anesthesia was  obtained, the patient's left lower extremity was prepped using Betadine  paint and draped into a sterile field.  The wound was opened.  The  previous antibiotic beads were removed.  There was a very small amount  of odor to the wound, but there was no drainage.  The skin, soft tissue,  and bone was curetted, but there would be no purulent abscess, no  necrotic bone.  The antibiotic beads were removed.  The wound was  irrigated with pulsatile lavage.  There  was further debridement with the  curette and the rongeur.  All fibrinous tissue was removed.  There was  good petechial bleeding.  Antibiotic beads were then made with Palacos  cement and 1 g of tobramycin and 1g of Vancomycin.  Beads were then  placed within the wound on a Prolene suture.  The wound was  closed using 2-0 nylon.  The wound was covered with Adaptic, orthopedic  sponges, sterile Webril, and a Coban dressing.  The patient was  extubated and taken to the PACU in stable condition.  Plan for 22-hour  observation with discharge to home in the morning.      Nadara Mustard, MD  Electronically Signed     MVD/MEDQ  D:  08/22/2007  T:  08/22/2007  Job:  [id number]

## 2010-09-21 NOTE — Op Note (Signed)
NAME:  SOLLIE, VULTAGGIO NO.:  [id number]   MEDICAL RECORD NO.:  [id number]          PATIENT TYPE:  OIB   LOCATION:  2550                         FACILITY:  [facility]   PHYSICIAN:  Nadara Mustard, MD     DATE OF BIRTH:  1955-07-19   DATE OF PROCEDURE:  10/05/2006  DATE OF DISCHARGE:                               OPERATIVE REPORT   PREOPERATIVE DIAGNOSIS:  Chronic osteomyelitis with abscess and draining  ulcer, left foot.   POSTOPERATIVE DIAGNOSIS:  Chronic osteomyelitis with abscess and  draining ulcer, left foot.   PROCEDURE PERFORMED:  1. Removal of antibiotic beads.  2. Irrigation and debridement of soft, soft tissue and bone.  3. Placement of antibiotic beads.   SURGEON:  Nadara Mustard, MD.   ANESTHESIA:  General.   ESTIMATED BLOOD LOSS:  Minimal.   ANTIBIOTICS:  1 gm of vancomycin.   DRAINS:  One Penrose drain.   TOURNIQUET TIME:  None.   CULTURES OBTAINED:  Cultures obtained x two.   DISPOSITION:  To the PACU in stable condition.   INDICATIONS FOR PROCEDURE:  The patient is a 55 year old gentleman,  status post repeat irrigation and debridements for chronic abscess of  his left foot with osteomyelitis.  The patient was seen in the office  yesterday, had recurrence of the abscess despite antibiotic bead  placement, and presents at this time for repeat irrigation and  debridement.  The risks and benefits were discussed including infection,  neurovascular injury, nonhealing of the wound, need for an amputation.  The patient states he understands and wishes to proceed at this time.   DESCRIPTION OF PROCEDURE:  The patient was brought to OR #4 and  underwent a general anesthetic.  After adequate levels of anesthesia  were obtained, the patient's left lower extremity was prepped using  DuraPrep and draped into a sterile field.  The wound was opened.  Antibiotic beads were removed.  The wound was cultured x two.  The wound  was debrided of skin, soft  tissue and bone.  Pulse lavage was used to  further debride the wound.  There was good bleeding tissue, no evidence  of any persistent purulence or infection after debridement.  Antibiotic  beads were then made on the back table consisting of 1 gm of vancomycin,  1.2 gm of tobramycin, with Palacos cement.  These were mixed into beads  and were placed on a string of #1 PDS.  A small Penrose drain was also  placed to allow the wound to be wicked open.  The wound was then covered  with Adaptic orthopedic sponges, ABD dressing, Kerlix and a Coban  dressing.  The patient was then extubated and taken to the PACU in  stable condition.   PLAN:  For IV vancomycin.  Anticipate the patient will need six weeks of  IV antibiotics and will plan for long-term antibiotics once the cultures  are finalized.      Nadara Mustard, MD  Electronically Signed     MVD/MEDQ  D:  10/05/2006  T:  10/05/2006  Job:  [id number]

## 2010-09-21 NOTE — Op Note (Signed)
NAME:  Daniel, Perkins NO.:  [id number]   MEDICAL RECORD NO.:  [id number]          PATIENT TYPE:  OIB   LOCATION:  5015                         FACILITY:  [facility]   PHYSICIAN:  Nadara Mustard, MD     DATE OF BIRTH:  May 17, 1955   DATE OF PROCEDURE:  11/09/2006  DATE OF DISCHARGE:                               OPERATIVE REPORT   PREOPERATIVE DIAGNOSIS:  Chronic osteomyelitis, left foot.   POSTOPERATIVE DIAGNOSIS:  Chronic osteomyelitis, left foot.   PROCEDURES:  1. Removal of antibiotic beads.  2. Irrigation and debridement of skin, soft tissue and bone.  3. Placement of bone graft.  4. Placement of antibiotics of vancomycin and tobramycin within the      bone graft.   SURGEON:  Nadara Mustard, MD   ANESTHESIA:  General LMA.   ESTIMATED BLOOD LOSS:  Minimal.   ANTIBIOTICS:  1 g of Kefzol.   DRAINS:  None.   COMPLICATIONS:  None.   TOURNIQUET TIME:  None.   DISPOSITION:  To PACU in stable condition.   INDICATION FOR PROCEDURE:  The patient is a 55 year old gentleman with  peripheral vascular disease, status post triple arthrodesis on the left  for which he had skin breakdown secondary to his tobacco use, which had  developed into osteomyelitis.  The patient has undergone serial  irrigation and debridements with placement of antibiotic beads and  presents at this time for revision surgery.  Risks and benefits were  discussed including persistent infection, neurovascular injury,  potential for an amputation, need for additional surgery.  The patient  states he understands and wished to proceed at this time.   DESCRIPTION OF PROCEDURE:  The patient was brought to OR room #5 and  underwent a general LMA anesthetic.  After adequate levels of anesthesia  obtained, the patient's left lower extremity was prepped using DuraPrep  and draped into a sterile field.  The wound was opened and the  antibiotic beads were removed.  The patient underwent irrigation  and  debridement of skin, soft tissue and bone.  There was no odor, there was  no drainage, no evidence infection.  There was good bleeding along the  bone and wound edges.  There was no necrotic tissue.  The wound was  irrigated with pulse lavage.  Further debridement of fibrinous tissue  was performed.  The patient had then 40 mL of cancellous chips mixed  with 1 g of vancomycin and 1.2 g of tobramycin.  This was then used as a  bone graft to pack within the dead space.  This was compressed well.  The patient's wound was closed with 2-0 nylon.  The wound was covered  with Adaptic orthopedic sponges, sterile Webril and a Coban dressing.  The patient was extubated and taken to the PACU in stable condition.  Plan for 23-hour observation, discharge in the morning, continue with IV  vancomycin at home.      Nadara Mustard, MD  Electronically Signed     MVD/MEDQ  D:  11/09/2006  T:  11/09/2006  Job:  [id number]

## 2010-09-21 NOTE — Op Note (Signed)
NAME:  Daniel Perkins, Daniel Perkins NO.:  [id number]   MEDICAL RECORD NO.:  [id number]          PATIENT TYPE:  OIB   LOCATION:  5036                         FACILITY:  [facility]   PHYSICIAN:  Nadara Mustard, MD     DATE OF BIRTH:  [birth year]   DATE OF PROCEDURE:  10/10/2007  DATE OF DISCHARGE:                               OPERATIVE REPORT   PREOPERATIVE DIAGNOSIS:  Chronic osteomyelitis, left foot.   POSTOPERATIVE DIAGNOSIS:  Chronic osteomyelitis, left foot.   PROCEDURE:  Irrigation and debridement, removal of antibiotic beads, and  placement of graft on bone graft with BMP with vancomycin 1 g and  tobramycin 1.2 g.   SURGEON:  Nadara Mustard, MD   ANESTHESIA:  General.   ESTIMATED BLOOD LOSS:  Minimal.   ANTIBIOTICS:  Kefzol 1 g.   DRAINS:  None.   COMPLICATIONS:  None.   TOURNIQUET TIME:  None.   DISPOSITION:  To PACU in stable condition.   INDICATION FOR PROCEDURE:  The patient is a 55 year old gentleman who is  status post multiple surgical interventions for chronic osteomyelitis of  his left midfoot.  The patient presents at this time for revision  surgery with possible bone grafting.  Risks and benefits were discussed  including infection, neurovascular injury, nonhealing wound, and need  for additional surgery.  The patient states he understands and wishes to  proceed at this time.   DESCRIPTION OF PROCEDURE:  The patient is brought to OR room 4 and  underwent a general anesthetic.  After adequate level of anesthesia  obtained, the patient's left lower extremity was prepped using DuraPrep  and draped in a sterile field.  The antibiotic beads were removed.  The  wound was irrigated with pulse lavage.  A curette was used to debride  around the bone edges.  There was good bleeding tissue.  There was no  purulence, no evidence of any residual infection.  There was no odor.  The graft on demineralized bone matrix and allograft was mixed with 1 g  of vancomycin  and 1.2 g of tobramycin on the back table.  This was then  inserted into the bone defect.  The wound was again cleansed, and the  incision was closed using 2-0 nylon.  The skin was closed without any  tension on the skin.  There were no necrotic edges around the wound.  The wound was covered  with Adaptic orthopedic sponges, Kerlix, and Coban.  The patient was  extubated and taken to PACU in stable condition.  Plan is for overnight  observation with discharge continuation of his p.o. Cipro.      Nadara Mustard, MD  Electronically Signed     MVD/MEDQ  D:  10/10/2007  T:  10/11/2007  Job:  [id number]

## 2010-09-21 NOTE — Op Note (Signed)
NAME:  Daniel Perkins, Daniel Perkins NO.:  [id number]   MEDICAL RECORD NO.:  [id number]          PATIENT TYPE:  INP   LOCATION:  5008                         FACILITY:  [facility]   PHYSICIAN:  Nadara Mustard, MD     DATE OF BIRTH:  Jun 26, 1955   DATE OF PROCEDURE:  03/29/2007  DATE OF DISCHARGE:                               OPERATIVE REPORT   PREOPERATIVE DIAGNOSIS:  Chronic osteomyelitis, left foot.   POSTOPERATIVE DIAGNOSIS:  Chronic osteomyelitis, left foot.   PROCEDURE:  1. Removal of antibiotic beads.  2. Irrigation and debridement of left foot including the talus      calcaneus navicular and mid foot with debridement skin, soft tissue      and bone.  3. Placement of antibiotic beads.   SURGEON:  Nadara Mustard, M.D.   ANESTHESIA:  General.   ESTIMATED BLOOD LOSS:  Minimal.   ANTIBIOTICS:  1 gram vancomycin.   DRAINS:  None.   COMPLICATIONS:  None.   TOURNIQUET TIME:  None.   DISPOSITION:  To PACU in stable condition.   INDICATIONS FOR PROCEDURE:  The patient is a 55 year old gentleman with  chronic osteomyelitis of his left midfoot and hind foot.  The patient  has undergone prolonged limb salvage surgery and presents at this time  for repeat irrigation debridement and placement of bone graft.  Risks  and benefits of surgery were discussed.  The patient states he  understands and wishes to proceed at this time.   DESCRIPTION OF PROCEDURE:  The patient was brought to OR Room 5 and  underwent a general anesthetic.  After adequate level of anesthesia was  obtained, the patient's left lower extremity was prepped using DuraPrep  and draped in a sterile field.  The sutures were removed, and the  antibiotic beads were removed.  The skin, soft tissue and bone was then  debrided of the calcaneus talus cuboid navicular and hind foot were  debrided.  This was then irrigated with pulse lavage.  There was no  purulence.  There was good petechial bleeding, good granulation  tissue,  no evidence of infection.  Ten mL of Vitoss bone graft was placed in the  far aspect of the wound.  Antibiotic beads were then made with  vancomycin and tobramycin with 1 gram of vancomycin and 1.2 grams of  tobramycin and Palacos cement.  These were mixed, and antibiotic beads  were placed within the wound.  The skin incision was then  closed with 2-0 nylon.  The wound was covered Adaptic orthopedic  sponges, sterile Webril, and a lightly wrapped Ace wrap.  The patient  states he has had a problem with Coban in the past.  The patient was  extubated, taken to PACU in stable condition.  Plan for 23-hour  observation.      Nadara Mustard, MD  Electronically Signed     MVD/MEDQ  D:  03/29/2007  T:  03/29/2007  Job:  [id number]

## 2010-09-21 NOTE — Op Note (Signed)
NAME:  Daniel Perkins, Daniel Perkins NO.:  [id number]   MEDICAL RECORD NO.:  [id number]          PATIENT TYPE:  OIB   LOCATION:  5023                         FACILITY:  [facility]   PHYSICIAN:  Nadara Mustard, MD     DATE OF BIRTH:  07-01-1955   DATE OF PROCEDURE:  12/11/2008  DATE OF DISCHARGE:                               OPERATIVE REPORT   PREOPERATIVE DIAGNOSIS:  Chronic osteomyelitis left hind foot.   POSTOPERATIVE DIAGNOSIS:  Chronic osteomyelitis left hind foot.   PROCEDURE:  1. Irrigation and debridement of skin, soft tissue and bone.  2. Placement of antibiotic beads with gentamicin.   SURGEON:  Nadara Mustard, MD   ANESTHESIA:  General.   ESTIMATED BLOOD LOSS:  Minimal.   ANTIBIOTICS:  Kefzol 2 g.   DRAINS:  None.   COMPLICATIONS:  None.   Cultures obtained x2.   DISPOSITION:  To PACU in stable condition.   INDICATIONS FOR PROCEDURE:  The patient is a 55 year old gentleman with  chronic osteomyelitis of the hind foot.  He has gone for a prolonged  period of time without recurrence, however, at this time, he has started  to develop some recurrent purulent drainage with odor and presents at  this time for revision irrigation and debridement.  Risks and benefits  were discussed including infection, neurovascular injury, persistent  pain, and need for additional surgery.  The patient states he  understands and wished to proceed at this time.   DESCRIPTION OF PROCEDURE:  The patient was brought to OR room 1 and  underwent a general anesthetic.  After adequate level of anesthesia was  obtained, the patient's left lower extremity was prepped using DuraPrep  and draped into a sterile field.  The chronic wound was ellipsed out in  one block of tissue.  This was carried down to the bone and the bone was  curetted and debrided with the curette, rongeur and sharp debridement.  This was then irrigated with pulsatile lavage.  After all the fibrinous  tissue was  debrided, there was good bleeding granulation tissue.  There  was no evidence of any purulence.  This was irrigated further with  pulsatile lavage and bone was packed with the Stimulan bone graft with  gentamicin.  The skin was then closed without tension on the skin with a  modified vertical mattress suture with 2-0 nylon.  Deep cultures were  obtained prior to placement of the antibiotic beads.  The patient was  extubated and taken to PACU in stable condition.  Plan for discharge to  home in 23 hours with continuation of p.o. antibiotics.      Nadara Mustard, MD  Electronically Signed     Nadara Mustard, MD  Electronically Signed    MVD/MEDQ  D:  12/11/2008  T:  12/12/2008  Job:  [id number]

## 2010-09-21 NOTE — Op Note (Signed)
NAME:  Daniel Perkins, Daniel Perkins NO.:  [id number]   MEDICAL RECORD NO.:  [id number]          PATIENT TYPE:  OIB   LOCATION:  2550                         FACILITY:  [facility]   PHYSICIAN:  Nadara Mustard, MD     DATE OF BIRTH:  1956/04/23   DATE OF PROCEDURE:  05/17/2007  DATE OF DISCHARGE:                               OPERATIVE REPORT   PREOP DIAGNOSIS:  Chronic osteomyelitis with open wound left foot.   POSTOP DIAGNOSIS:  Chronic osteomyelitis with open wound left foot.   PROCEDURES:  1. Irrigation and debridement, left foot wound of skin, soft tissue      and bone.  2. Removal of antibiotic beads.  3. Application of bone graft with vancomycin and tobramycin powder.  4. Closure of wound.   SURGEON:  Nadara Mustard, MD   ANESTHESIA:  General.   ESTIMATED BLOOD LOSS:  Minimal.   ANTIBIOTICS:  One gram of Kefzol IV.   DRAINS:  None.   COMPLICATIONS:  None.   TOURNIQUET TIME:  None.   DISPOSITION:  To PACU in stable condition.   INDICATIONS FOR PROCEDURE:  The patient is a 55 year old gentleman who  is status post osteomyelitis from tobacco abuse secondary to a triple  arthrodesis.  The patient has undergone prolonged care with repeat  irrigation and debridements, application of antibiotic beads and  presents at this time for revision surgery.  Risks and benefits were  discussed including persistent infection, nonhealing of the wound, need  for additional surgery, potential for amputation.  The patient states he  understands and wished to proceed at this time.   DESCRIPTION OF PROCEDURE:  The patient is brought to OR room four and  underwent a general anesthetic.  After an adequate level of anesthesia  was obtained the patient's left lower extremity was prepped using  DuraPrep and draped into a sterile field.  The incision was closing  nicely.  This was opened.  The antibiotic beads were removed using the  curette.  The wound was debrided of skin, soft tissue  and bone.  The  wound was irrigated with pulse lavage.  There was no abscess.  There was  good petechial bleeding.  No evidence of infection.  The VITOSS bone  strip was then mixed with 1 gram of vancomycin, 1.2 grams of tobramycin  mixed with normal saline on the back table.  This was then implanted and  the wound was closed using 2-0 nylon.  There was no tension on the skin.  The wound was covered with Adaptic, orthopedic sponges, sterile Webril  and Coban dressing.  The patient was extubated, taken to PACU in stable  condition.  Plan is for 23-hour observation and discharge to home in the  morning.      Nadara Mustard, MD  Electronically Signed     MVD/MEDQ  D:  05/17/2007  T:  05/17/2007  Job:  [id number]

## 2010-09-21 NOTE — Discharge Summary (Signed)
NAME:  Daniel Perkins, Daniel Perkins NO.:  [id number]   MEDICAL RECORD NO.:  [id number]          PATIENT TYPE:  INP   LOCATION:  5009                         FACILITY:  [facility]   PHYSICIAN:  Nadara Mustard, MD     DATE OF BIRTH:  Apr 11, 1956   DATE OF ADMISSION:  10/05/2006  DATE OF DISCHARGE:  10/09/2006                               DISCHARGE SUMMARY   FINAL DIAGNOSIS:  Chronic osteomyelitis, left foot.   PROCEDURE:  Irrigation debridement left foot with excision of  osteomyelitis from the calcaneus, navicular and cuboid and placement of  antibiotic beads with vancomycin and tobramycin.  Surgeon Lajoyce Corners.  Cultures were obtained x2.  The patient was discharged to home in stable  condition.  He was started on IV vancomycin for 6 weeks.  Follow-up in  the office in 2 weeks.   HISTORY OF PRESENT ILLNESS:  The patient 55 year old gentleman with  chronic osteomyelitis of his left foot.  He has undergone multiple of  procedures for foot salvage and returns at this time for repeat  irrigation debridement.  The patient's hospital course was essentially  unremarkable.  He underwent repeat irrigation debridement of soft tissue  and bone as well as partial excision the calcaneus, cuboid, talus and  navicular.  He was started on antibiotic beads, started on IV  vancomycin.  A Penrose drain was placed deep in the wound.  Postoperatively, the patient progressed well.  He was set up with  [facility] for home health IV.  A PICC-line was placed for home  antibiotics.  The patient's cultures were positive for enterococci.  The  patient was discharged to home in stable condition on June 2 with follow-  up in the office in 2 weeks.      Nadara Mustard, MD  Electronically Signed     MVD/MEDQ  D:  11/16/2006  T:  11/17/2006  Job:  [phone] [id number]
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
## 2010-09-24 NOTE — Op Note (Signed)
NAME:  Daniel Perkins, Daniel Perkins NO.:  [id number]   MEDICAL RECORD NO.:  [id number]          PATIENT TYPE:  INP   LOCATION:  5032                         FACILITY:  [facility]   PHYSICIAN:  Nadara Mustard, MD     DATE OF BIRTH:  1955-12-03   DATE OF PROCEDURE:  02/22/2006  DATE OF DISCHARGE:                                 OPERATIVE REPORT   PREOPERATIVE DIAGNOSIS:  Club foot deformity with subtalar arthrosis, left  foot.   POSTOPERATIVE DIAGNOSIS:  Club foot deformity with subtalar arthrosis, left  foot.   PROCEDURE:  Left foot triple arthrodesis.   SURGEON.:  Nadara Mustard, MD   ANESTHESIA:  LMA plus popliteal block.   ESTIMATED BLOOD LOSS:  Minimal.   ANTIBIOTICS:  1 g of Kefzol.   DRAINS:  None.   COMPLICATIONS:  None.   TOURNIQUET TIME:  Esmarch at the ankle for approximately 54 minutes.   DISPOSITION:  To PACU in stable condition.   INDICATION FOR PROCEDURE:  The patient is a 55 year old gentleman status  post closed treatment for a club foot deformity as a child, who has  developed progressive deformity and pain with activities of daily living.  The patient states he has failed conservative care and wishes to proceed  with surgical intervention at this time.  Risks and benefits were discussed,  including infection, neurovascular injury, nonhealing of the wound,  nonhealing of bone, need for additional surgery.  The patient states he  understands and wishes to proceed at this time.   DESCRIPTION OF PROCEDURE:  The patient was brought to OR room #8, underwent  a popliteal block.  The patient then underwent general LMA anesthetic.  After an adequate level of anesthesia obtained, the patient's left lower  extremity was prepped using DuraPrep and draped into a sterile field.  An  Collier Flowers was used to cover all exposed skin.  An oblique incision was made over  the sinus tarsi.  This was carried down.  The extensor muscles were elevated  and the calcaneocuboid and  the subtalar joint were debrided of articular  cartilage.  There were significant degenerative cystic changes, and this was  all extensively debrided.  After debridement of the subtalar joint itself  and the calcaneocuboid joint, attention was then focused to the  talonavicular joint.  A vertical incision was made just medial to the  anterior tibial tendon.  Sharp dissection was carried down to the  talonavicular joint, and this was debrided of articular cartilage.  The foot  was then held at neutral ankle dorsiflexion with neutral varus-valgus with  the foot plantigrade.  A K-wire was inserted from the talar neck into the  calcaneus.  This was measured and a 65 mm screw was inserted, a 7.3  partially-threaded.  C-arm fluoroscopy verified reduction.  Two separate  screws were then inserted, one through the cuboid to stabilize the  calcaneocuboid joint and the second through the medial cuneiform to  stabilize the medial column.  C-arm fluoroscopy verified reduction of both  AP and lateral planes.  The bone graft obtained from the wound and the  demineralized bone  matrix plus crouton allograft was then mixed and added to  the talonavicular and subtalar joints.  This was performed after the wound  was irrigated with normal saline.  The Esmarch was released and hemostasis  was obtained.  The subcu was closed using 2-0 Vicryl.  The skin was closed  using 2-0 nylon with a modified vertical mattress suture.  There was no  tension on the skin.  The wounds were covered with Adaptic, orthopedic  sponges, sterile Webril, and a Coban dressing.  The patient was extubated,  taken to the PACU in stable condition.      Nadara Mustard, MD  Electronically Signed     MVD/MEDQ  D:  02/22/2006  T:  02/23/2006  Job:  [id number]

## 2010-09-24 NOTE — Procedures (Signed)
[facility]  Patient:    Daniel Perkins, Daniel Perkins Visit Number: [id number] MRN: [id number]          Service Type: PMG Location: TPC Attending Physician:  Sondra Come Dictated by:   Jewel Baize Stevphen Rochester, M.D. Proc. Date: 10/16/01 Admit Date:  10/02/2001   CC:         Dr. Yvette Rack Neurological   Procedure Report  PROCEDURE:  SURGEON:  Jewel Baize. Stevphen Rochester, M.D.  INDICATIONS:  The patient comes to enter pain management today.  I evaluated and reviewed health and history form and 14 point review of systems.  1. He had an equivocal response to cervical facetal injection, right side, but    did seen to notice some improvement in range of motion.  He is still    troubled by his myofascial component and paracervical discomfort, and he is    feeling that he is not making significant progress forward since his    original injury. I do think it warranted and reasonable to go ahead and    proceed with a second cervical facet injection as the mechanism of injury,    his descriptive pain, and impaired functional indices do support assessing    whether we should move forward with a neurotomy.  Should he not be an RF    candidate, he is possibly a surgical candidate and he would like to avoid    this if possible. 2. Do not feel any further imaging or diagnostics are warranted. 3. Review medications.  He has been appropriate to his medications and would    like to avoid escalation of narcotic-based pain medication, and possibly    neurotomy, and interventional procedures are a best approach here.  Objectively, diffuse paracervical myofascial discomfort, impaired flexion and extension, and lateral rotational pain, positive cervical facetal compression test, right greater than left.  No new neurological features, motor, sensory, and reflexes.  IMPRESSION:  Cervicalgia, degenerative spinal disease of cervical spine, and cervical facet syndrome.  PLAN:  Cervical  facet injection at C4, 5, 6, and 7 at independent needle access points.  He has consented and he will access this as in the context of activities of daily living.  He will follow up in three to four weeks to reassess and he has consented.  DESCRIPTION OF PROCEDURE:  The patient was taken to the fluoroscopy suite and placed in the supine position.  The neck prepped and draped in the usual fashion using a 25-gauge needle and advanced to the cervical facet, medial branch at C4, 5, 6, and 7, and independent needle access to confirm placement in multiple fluoroscopic positions.  I then injected 0.5 cc of 1% lidocaine at each level with a total of 40 mg of Aristocort in divided dose.  The patient tolerated the procedure well.  No complications from the procedure.  Appropriate recovery.  Discharge instructions given.  Will see him in follow up.  Instructed to maintain contact with Dr. Otho Ket. Dictated by:   Jewel Baize Stevphen Rochester, M.D. Attending Physician:  Sondra Come DD:  10/16/01 TD:  10/18/01 Job: [id number]

## 2010-09-24 NOTE — Op Note (Signed)
NAME:  Daniel Perkins, Daniel Perkins NO.:  [id number]   MEDICAL RECORD NO.:  [id number]                   PATIENT TYPE:  AMB   LOCATION:  ENDO                                 FACILITY:  [facility]   PHYSICIAN:  Petra Kuba, M.D.                 DATE OF BIRTH:  Mar 28, 1956   DATE OF PROCEDURE:  04/26/2002  DATE OF DISCHARGE:                                 OPERATIVE REPORT   PROCEDURE:  Colonoscopy.   INDICATIONS:  Abnormal CT during hospitalization.   The consent was signed after the risks, benefits, methods, and options were  thoroughly discussed in the office in the past.   Medicines used--Demerol 50 and Versed 5.   PROCEDURE IN DETAIL:  Rectal inspection was pertinent for external  hemorrhoids.  Small digital exam was negative.  The video pediatric  adjustable colonoscope was inserted and because of some looping required  rolling him on his back and then on his right side to advance to the cecum.  There was some mild abdominal pressure needed.  No obvious abnormalities  were seen on insertion.  The cecum was identified by the appendiceal orifice  and the ileocecal valve.  In fact, the scope was inserted a short ways into  the terminal ileum, which was normal.  Photo documentation was obtained.  No  TI abnormalities were seen.  The scope was then slowly withdrawn.  The prep  was adequate.  There was some liquid stool that required washing and  suctioning.  The scope was slowly withdrawn through the colon.  Other than a  distal, probable hyperplastic-appearing polyp which was cold-biopsied x2, no  other abnormalities were seen.  Once back in the rectum, the scope was  retroflexed, pertinent for some tiny internal hemorrhoids.  The scope was  drained and readvanced a short ways up the left side of the colon.  Air was  suctioned, and the scope was removed.  The patient tolerated the procedure  well.  There were no obvious immediate complications.   ENDOSCOPIC  DIAGNOSES:  1. Internal and external small hemorrhoids.  2. Tiny distal sigmoid polyp, cold biopsies.  3. Otherwise within normal limits to the terminal ileum.   PLAN:  Yearly rectals and guaiacs per Dr. Tresa Endo.  Happy to see back p.r.n.  Will follow up in three months.  Recheck if symptoms.  Make sure no further  workup plans are needed.  We will await pathology, but probably no big  surprise.  Recommend repeat screening at age 26.                                               Petra Kuba, M.D.    MEM/MEDQ  D:  04/26/2002  T:  04/27/2002  Job:  [id number]   cc:  Tresa Endo, M.D.  [facility]

## 2010-09-24 NOTE — H&P (Signed)
NAME:  Daniel Perkins, SHEERIN NO.:  [id number]   MEDICAL RECORD NO.:  [id number]            PATIENT TYPE:   LOCATION:                                 FACILITY:   PHYSICIAN:  Sharolyn Douglas, M.D.             DATE OF BIRTH:   DATE OF ADMISSION:  08/17/2005  DATE OF DISCHARGE:                                HISTORY & PHYSICAL   CHIEF COMPLAINT:  Sever intrascapular neck pain with left upper extremity  radiation.   HISTORY OF PRESENT ILLNESS:  The patient is a pleasant 55 year old male with  severe neck and intrascapular pain with radiation to the left upper  extremity.  He has had multiple previous neck surgeries with  anterior/posterior fusion of C4/5 and C5/6.  He has failed to respond to all  conservative treatments.  It is felt that his ongoing symptoms are related  to the subadjacent C6/7 level.  He now presents for extension of his fusion  across the C6/7 disc space with allograft bone supplemented with anterior  iliac crest bone graft and anterior cervical plate.  He has had problems  with dysphonia and dysphagia as well as a crunching sensation in his neck.  No fevers, chills, or sweats.  No unexplained weight loss.   PAST MEDICAL HISTORY:  1.  Depression.  2.  Hypertension.  3.  Hypercholesterolemia.  4.  Degenerative disc disease.   PAST SURGICAL HISTORY:  1.  Neck surgery in 1994, 2002 and 2004 with fusion of C4/5 and C5/6.  2.  Right foot surgery in 2006.   FAMILY HISTORY:  Heart disease, hypertension, diabetes and arthritis.   SOCIAL HISTORY:  He is married.  He smokes a half pack of cigarettes a day.  He is trying to stop in preparation for his surgery.  His wife will be his  primary care giver after surgery.  He lives in a [place] house with four  stairs to get into the entrance.   PHYSICAL EXAMINATION:  GENERAL:  He is pleasant and cooperative.  VITAL SIGNS:  His pulse is 80, respirations 20, blood pressure 120/72.  HEENT:  Pupils are equal, round,  and reactive to light and accommodation.  He has a healed anterior cervical incision as well as a posterior cervical  incision.  There is no signs of infection.  His neck is stiff.  He only  extends to neutral.  He can forward flex to 15 degrees.  Slight decrease  sensation on the left C7 distribution.  Give way weakness in the left  triceps.  CHEST:  Clear to auscultation.  There is no wheezing or rhonchi.  HEART:  Regular rate and rhythm.  No murmurs heard.  ABDOMEN:  Soft and nontender.  The spleen and liver is not palpated.  SKIN:  Intact.  Distal pulses are palpable.  GENITOURINARY:  Not performed.   Radiographs show a solid anterior-posterior fusion from C4 to C6.  There are  advanced degenerative changes below at C6/7.   IMPRESSION:  Subadjacent spondylosis and degenerative disc disease below  previous anterior-posterior cervical vertebrae-4 through  cervical vertebrae-  6 fusion with chronic intractable intrascapular pain and left upper  extremity radiation.   PLAN:  The patient will be admitted to the hospital on August 17, 2005 for  anterior cervical discectomy and fusion at C6/7 using allograft bone  supplemented with iliac crest bone graft and anterior cervical plate.  The  risks and benefits were reviewed.  All questions were encouraged and  answered.  He has obtained medical clearance from his primary care  physician, Armanda Magic, M.D.      Sharolyn Douglas, M.D.  Electronically Signed     MC/MEDQ  D:  08/10/2005  T:  08/11/2005  Job:  [id number]

## 2010-09-24 NOTE — Consult Note (Signed)
NAME:  Daniel Perkins, Daniel Perkins NO.:  [id number]   MEDICAL RECORD NO.:  [id number]          PATIENT TYPE:  INP   LOCATION:  0364                         FACILITY:  [facility]   PHYSICIAN:  Armanda Magic, M.D.     DATE OF BIRTH:  1955/05/22   DATE OF CONSULTATION:  07/05/2006  DATE OF DISCHARGE:                                   CONSULTATION   REFERRING PHYSICIAN:  Dr. Rito Ehrlich.   PRIMARY PHYSICIAN:  Dr. Leatrice Jewels.   CHIEF COMPLAINT:  Shortness of breath.   This is a very pleasant 55 year old black male who sent to [facility] emergency  room yesterday - July 04, 2004 - with a complaint of sudden onset of  dizziness, lightheadedness, and significant dyspnea on exertion with minimal  exertion approximately 4 p.m. He says his symptoms persisted for about 4  hours. He had recurrent lightheadedness yesterday and also had left  circumoral face numbness and light pain to the left face with episode 2 days  ago. He says he has had intermittent symptoms of dyspnea on exertion over  the past 2-3 months but denies any chest pain, tachypalpitations, orthopnea,  or heartburn. He says over the past 2-3 months intermittently he will go to  walk across the hall and all of a sudden become significantly short of  breath.   PAST MEDICAL HISTORY:  Significant for hypertension, hyperlipidemia.   PAST SURGICAL HISTORY:  Status post cervical spinal fusion. He has also had  surgery for club feet and is due to get some surgery on his left foot.   MEDICATIONS OUTPATIENT:  1.  Lipitor 10 mg a day.  2.  Hyzaar 50/12.5 mg a day.   ALLERGIES:  None.   FAMILY HISTORY:  His father is alive with hypertension and diabetes  mellitus. His mother is alive with coronary disease. She had questionable  CABG and mitral valve replacement with hypertension, diabetes mellitus, and  a pacemaker. He has one brother who died at a fairly early age in his sleep.  He said an autopsy was done which showed a heart  attack. The patient was in  his 30s.   SOCIAL HISTORY:  He is married with three children. He smokes one-half pack  of cigarettes per day but at one point smoked one pack of cigarettes per day  for the past 15-20 years. He is currently on Workers Comp secondary to his  neck problems.   REVIEW OF SYSTEMS:  Other than what is stated in the HPI is negative.   PHYSICAL EXAMINATION:  VITAL SIGNS:  Blood pressure is 170/114, pulse 85,  respirations 18.  GENERAL:  He is a well-developed, well-nourished male in no acute distress.  HEENT:  Benign.  NECK:  Supple without lymphadenopathy. Carotid upstrokes are +2 bilaterally,  no bruits.  LUNGS:  Clear to auscultation throughout.  HEART:  Regular rate and rhythm. No murmurs, rubs, or gallops. Normal S1,  S2.  ABDOMEN:  Soft, nontender, nondistended, with active bowel sounds. No  hepatosplenomegaly.  EXTREMITIES:  No cyanosis, erythema, or edema. Trace distal pulses  bilaterally.  NEUROLOGIC:  He is alert and oriented x3.  EKG shows sinus rhythm with nonspecific ST abnormality. Chest x-ray shows no  acute cardiopulmonary abnormalities. Labs show sodium 135, potassium 4.1,  chloride 104, CO2 27, glucose 119, BUN 15, creatinine 1.3, calcium 8.7. BNP  was less than 30. CBC:  White cell count was 9.2, hematocrit 43.8,  hemoglobin 14.5, platelet count 320. CPK-MB:  CPK is 129, MB 1.9, troponin  0.22. CPK again was 120 with MB 1.8 and troponin 0.19. Third CPK was 137  with MB of 1.5 and troponin 0.12.   ASSESSMENT:  1.  Dyspnea on exertion with slight bump in troponin, CPKs are normal. The      patient also gives a 2- to 24-month history of dyspnea on exertion with      minimal exertion, as well as some left facial and arm numbness      intermittently with exertion with resolves with rest. He had multiple      cardiac risk factors including his age; sex; family history of coronary      disease, especially at an early age with his brother;  tobacco abuse;      hypertension; and hyperlipidemia.  2.  Hypertension, poorly controlled.  3.  Hyperlipidemia, recently started on Lipitor.   PLAN:  1.  Check a 2-D echocardiogram to rule out structural heart disease as a      cause of shortness of breath. Also, evaluate for LVH and diastolic      dysfunction.  2.  I have recommended proceeding with a cardiac catheterization instead of      a stress test because of the patient's cardiac risk factors and his      symptomatology. I think there is greater than 50% chance of him having      underlying coronary disease. I have reviewed the risks and benefits of      catheterization with the patient including the risk of MI, CVA, death,      bleeding complications, IV contrast dye allergies, pseudoaneurysm, A-V      fistula, renal failure. The patient understands and wishes to proceed      and we will schedule him for Tuesday, July 06, 2004. In the interim      he will remain on an aspirin a day, I am going to start him on subcu      Lovenox, and continue his current medications. I am also going to start      him on Lopressor 25 mg b.i.d. to try to get his blood pressure under      better control. We will also check a fasting lipid panel.      TT/MEDQ  D:  07/05/2004  T:  07/05/2004  Job:  [id number]   cc:   Vikki Ports, M.D.  [address]  [place]  [place] [address]  Fax: [phone]

## 2010-09-24 NOTE — Consult Note (Signed)
[facility]  Patient:    MELVILLE, ENGEN Visit Number: [id number] MRN: [id number]          Service Type: PMG Location: TPC Attending Physician:  Sondra Come Dictated by:   Jewel Baize Stevphen Rochester, M.D. Proc. Date: 11/06/01 Admit Date:  10/02/2001   CC:         Dorena Bodo, M.D. [facility]   Consultation Report  REASON FOR CONSULTATION:  [facility].  I evaluate him.  I review health and history form and 14 point review of systems.  Jillyn Hidden is complaining of a radicular component to his right upper extremity. This has really not significantly changed in character, but is problematic to functional indices and quality of life indices.  He states he is unable to enjoy quality of life, he is breaking through conservative analgesic capacity, poor restorative sleep capacity, and difficulty with his arm and overall usage patterns.  He is not showing any signs of advancing neurological compromise, but problematic is the fact that he is unable to work and would like to return to work, and just enjoy lifestyle in general.  We are also having to increase him from non-narcotic medication alternatives.  He has not shown a positive provocative block to facetal injection.  I do not believe a neurotomy is indicated.  I am going to have him see a neurosurgeon.  If he is a nonsurgical candidate, would recommend transforaminal cervical epidural injection versus nerve root injection, and this is discussed with him.  PHYSICAL EXAMINATION:  Diffuse paracervical myofascial discomfort in peri-flexion, extension, and lateral rotational pain.  Positive cervical facetal compression test, right greater than left.  He has slightly decreased triceps jerk, but no significant change in neurological and musculoskeletal presentation.  IMPRESSION: 1. Cervicalgia. 2. Degenerative spinal disease of cervical spine. 3. Cervical  facet syndrome.  PLAN: 1. Conservative management. 2. Refer to neurosurgery. 3. Instructed to maintain contact with Dr. Dorena Bodo. Dictated by:   Jewel Baize Stevphen Rochester, M.D. Attending Physician:  Sondra Come DD:  11/06/01 TD:  11/07/01 Job: [id number]

## 2010-09-24 NOTE — Op Note (Signed)
NAME:  Daniel Perkins, WESTMAN NO.:  [id number]   MEDICAL RECORD NO.:  [id number]          PATIENT TYPE:  OIB   LOCATION:  2550                         FACILITY:  [facility]   PHYSICIAN:  Nadara Mustard, MD     DATE OF BIRTH:  1956/01/09   DATE OF PROCEDURE:  08/25/2006  DATE OF DISCHARGE:                               OPERATIVE REPORT   PREOPERATIVE DIAGNOSIS:  Chronic osteomyelitis, left foot, with  ulceration.   POSTOPERATIVE DIAGNOSIS:  Chronic osteomyelitis, left foot, with  ulceration.   PROCEDURE:  1. Irrigation and debridement of skin, soft tissue, and bone.  2. Removal of antibiotic beads.  3. Pulse lavage.  4. Application of WoundVac.  5. Application of new antibiotic beads.   SURGEON:  Nadara Mustard, M.D.   ANESTHESIA:  General.   ESTIMATED BLOOD LOSS:  Minimal.   ANTIBIOTICS:  Doxycycline preoperatively.   DRAINS:  None.   COMPLICATIONS:  None.   DISPOSITION:  To PACU in stable condition.   INDICATIONS FOR PROCEDURE:  The patient is a 55 year old gentleman with  chronic osteomyelitis of the hindfoot, left foot.  The patient presents  at this time for reevaluation for possible closure of the wound.  The  risks and benefits were discussed including infection, neurovascular  injury, persistent osteomyelitis, potential for amputation, and need for  additional surgery.  The patient states he understands and wishes  to  proceed at this time.   DESCRIPTION OF PROCEDURE:  The patient was brought to OR room 15 and  underwent a general anesthetic.  After an adequate level of anesthesia  was obtained, the patient's left lower extremity was prepped using  DuraPrep and draped into a sterile field.   The WoundVac was removed, and the antibiotic beads were removed.  The  wound was then irrigated of skin, soft tissue, and bone and underwent  irrigation with pulse lavage.  The wound had good petechial bleeding.  There was has no purulence, no gross infection.   Due to the depth of the  cavity of the wound, new antibiotic beads were placed with 1 gram of  vancomycin, 1.2 grams of tobramycin, and the beads were placed on a  Prolene suture and were fashioned with the Palacos cement.  This was  then covered with a new WoundVac which was applied.  This had good  suction at -75 mm of suction.   The patient was extubated and taken to the PACU in stable condition.  Plan for 23-hour observation.  Followup in the office in 1 week.      Nadara Mustard, MD  Electronically Signed     MVD/MEDQ  D:  08/25/2006  T:  08/25/2006  Job:  [id number]

## 2010-09-24 NOTE — Op Note (Signed)
NAME:  ARDA, KEADLE NO.:  [id number]   MEDICAL RECORD NO.:  [id number]          PATIENT TYPE:  OIB   LOCATION:  5011                         FACILITY:  [facility]   PHYSICIAN:  Nadara Mustard, MD     DATE OF BIRTH:  Dec 13, 1955   DATE OF PROCEDURE:  04/05/2006  DATE OF DISCHARGE:                               OPERATIVE REPORT   PREOPERATIVE DIAGNOSIS:  Infection, left foot status post triple  arthrodesis with retained hardware.   POSTOPERATIVE DIAGNOSIS:  Infection, left foot status post triple  arthrodesis with retained hardware.   PROCEDURE:  1. Removal of deep hardware left foot.  2. Irrigation and debridement skin, soft tissue and bone.  3. Placement of antibiotic beads with vancomycin and tobramycin 1 gram      each.  4. Application of the wound VAC.   SURGEON.:  Nadara Mustard, MD   ANESTHESIA:  General.   ESTIMATED BLOOD LOSS:  Minimal.   ANTIBIOTICS:  Clindamycin 600 mg IV.  The patient was on doxycycline  preoperatively.   DRAINS:  None.   COMPLICATIONS:  None.   TOURNIQUET TIME:  None.   CULTURES:  Obtained x2.   DISPOSITION:  To PACU in stable condition.   INDICATIONS FOR PROCEDURE:  The patient is a 55 year old gentleman  status post triple arthrodesis left foot approximately 6 weeks ago.  The  patient has had progressive slow necrosis of the lateral skin wound.  All other wounds have healed nicely.  The patient had was advised to  stop smoking throughout the course of the wound breakdown.  The patient  states he was unable to stop smoking and has progressed with breakdown  with exposed bone at this time and presents for surgical intervention.  Risks and benefits were discussed including infection, neurovascular  injury, nonhealing of the wound, need for additional surgery, potential  loss of his foot.  The patient states he understands and wished to  proceed at this time.   DESCRIPTION OF PROCEDURE:  The patient was brought to OR  room one and  underwent general anesthetic.  After adequate level of anesthesia  obtained, the patient's left lower extremity was prepped using DuraPrep  and draped into a sterile field.  The necrotic would was ellipsed out.  This measured approximately 4 x 3 cm.  This was ellipsed out back to  bleeding viable healthy tissue.  The bone graft and the nonviable bone  was then debrided from the wound.  The screws were removed from inside  out.  They were visible within the wound and were removed  percutaneously.  Three screws were removed in total.  The wound was  irrigated with pulse lavage.  There was good bleeding bone after the  pulse lavage.  Antibiotic beads were then made with 1 gram of vancomycin  and 1.2 grams of tobramycin.  This was made with Palacos cement.  Antibiotic beads were placed deep within the wound.  The wound vac was  placed over the beads and an impervious dressing was applied.  This was  set for negative 75 mm of continuous suction and this had  good suction.  The patient was then extubated, taken to PACU in stable condition.  Plan  for admission and awaiting the cultures.  The patient may require IV  access for long-term vancomycin.      Nadara Mustard, MD  Electronically Signed     MVD/MEDQ  D:  04/05/2006  T:  04/06/2006  Job:  [id number]

## 2010-09-24 NOTE — Op Note (Signed)
NAME:  Daniel Perkins, Daniel Perkins NO.:  [id number]   MEDICAL RECORD NO.:  [id number]          PATIENT TYPE:  INP   LOCATION:  2550                         FACILITY:  [facility]   PHYSICIAN:  Nadara Mustard, MD     DATE OF BIRTH:  1955/07/17   DATE OF PROCEDURE:  08/18/2004  DATE OF DISCHARGE:                                 OPERATIVE REPORT   PREOPERATIVE DIAGNOSIS:  Talonavicular osteoarthritis, right midfoot.   POSTOPERATIVE DIAGNOSIS:  Talonavicular osteoarthritis, right midfoot.   PROCEDURE PERFORMED:  Right mid foot fusion of the talonavicular joint.   SURGEON:  Nadara Mustard, MD   ANESTHESIA:  General endotracheal.   ESTIMATED BLOOD LOSS:  Minimal.   ANTIBIOTICS:  1 gram of Kefzol.   TOURNIQUET TIME:  Esmarch at the ankle for approximately 35 minutes.   DISPOSITION:  To PACU in stable condition.   INDICATIONS FOR PROCEDURE:  The patient is a 55 year old gentleman who is  status post clubfoot surgery on the right foot as a child. 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 After failure of conservative care, the patient  presents at this time for talonavicular fusion. Risks and benefits were  discussed including infection, neurovascular injury, persistent pain, need  for additional surgery. The patient states he understands and wishes to  proceed at this time.   DESCRIPTION OF PROCEDURE:  The patient was brought to OR room 5 and  underwent a general endotracheal anesthetic. After adequate level of  anesthesia was obtained, the patient's right lower extremity was prepped  using DuraPrep and draped in a sterile field and the left iliac crest was  also prepped and draped into a sterile field. Attention was first focused to  the iliac crest.  An oblique incision was made in line with the iliac crest.  The lateral femoral cutaneous nerve area was protected and the iliac crest  bone graft  was obtained. The wound was irrigated with normal saline after  obtaining the iliac crest bone graft. The fascial layers were closed using 2-  0 Vicryl, subcu was closed with 2-0 Vicryl. Skin was closed using Proximate  staples. Attention was then focused on the right foot. The foot was elevated  and the Esmarch was wrapped around the ankle for tourniquet control. Ioban  covered all exposed skin.  A dorsal incision was made over the talonavicular  joint. This was carried down the medial to the anterior tibial tendon and  the dissection was carried sharply down to the talonavicular joint. C-arm  fluoroscopy verified the location of talonavicular joint. The talonavicular  joint was debrided of articular cartilage from the  proximal and distal aspects of the joint. The curette was used to further  debride the navicular. A locking plate was applied. This was locked  proximally and distally   DICTATED ENDED HERE.      MVD/MEDQ  D:  08/18/2004  T:  08/18/2004  Job:  [id number]

## 2010-09-24 NOTE — Op Note (Signed)
NAME:  Daniel Perkins, Daniel Perkins NO.:  [id number]   MEDICAL RECORD NO.:  [id number]          PATIENT TYPE:  INP   LOCATION:  2550                         FACILITY:  [facility]   PHYSICIAN:  Nadara Mustard, MD     DATE OF BIRTH:  01-Jan-1956   DATE OF PROCEDURE:  08/18/2004  DATE OF DISCHARGE:                                 OPERATIVE REPORT   PREOPERATIVE DIAGNOSIS:  Osteoarthritis, right midfoot.   POSTOPERATIVE DIAGNOSIS:  Osteoarthritis, right midfoot.   PROCEDURE:  Right foot talonavicular fusion with iliac crest bone graft from  the left hip.   SURGEON.:  Nadara Mustard, MD   ANESTHESIA:  General tracheal.   ESTIMATED BLOOD LOSS:  Minimal.   ANTIBIOTICS:  1 gram of Kefzol.   TOURNIQUET TIME:  Esmarch ankle for approximately 35 minutes.   DISPOSITION:  To PACU in stable condition.   INDICATIONS FOR PROCEDURE:  The patient is a 48-year gentleman status post  of the surgical intervention as a child for clubfoot deformities of the  right foot. The patient has had progressive avascular necrosis collapse at  the talonavicular joint and presents at this time for fusion. Risks and  benefits were discussed including infection, neurovascular injury,  persistent pain, need for additional surgery. The patient states he  understands and wished proceed at this time.   DESCRIPTION OF PROCEDURE:  The patient was brought to OR room 5 and  underwent general anesthetic. After adequate level of anesthesia obtained  the patient's left iliac crest and right foot were prepped using DuraPrep  and draped in a sterile field with Ioban used to cover all exposed skin.  Attention was first focused on the iliac crest. Iliac crest was harvested  with a incision parallel to the iliac crest with attention focused to  protect the lateral femoral cutaneous nerve area. The curved gouges and  curettes were used to obtain the corticocancellous bone graft. This was  prepared on the back table. The  iliac crest bone graft site was then  cleansed with saline. The fascial layers were closed using 2-0 Vicryl, subcu  was closed using 2-0 Vicryl. Skin was closed using Proximate staples.  Attention was then focused on the foot. The foot was elevated and the  Esmarch was wrapped around the ankle for tourniquet control. A dorsal  incision was made medial to the anterior tibial tendon. This was carried  down to the talonavicular joint. The talonavicular joint was debrided of  articular cartilage. There was complete collapse of the talar head. C-arm  fluoroscopy verified location. This was the lamina spreader was then used to  distract the joint.  Further debridement was obtained. This was irrigated  with normal saline and the joint was then packed with cortical and  corticocancellous bone graft.  This was then compressed using a compression  staple. C-arm fluoroscopy verified reduction in both AP and lateral planes.  Again the wound was irrigated. The incision was then closed using a far-  near-near-far suture with 2-0 nylon. There was no tension on the skin. The  wound was then covered with Adaptic orthopedic sponges, sterile Webril  and  Coban dressing. Total Esmarch time approximately 35 minutes. The patient was  extubated, taken to PACU in stable condition. Plan for progressive  ambulation, nonweightbearing on the right.      MVD/MEDQ  D:  08/18/2004  T:  08/18/2004  Job:  [id number]
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
## 2010-09-24 NOTE — Cardiovascular Report (Signed)
NAME:  Daniel Perkins, BRACH NO.:  [id number]   MEDICAL RECORD NO.:  [id number]          PATIENT TYPE:  INP   LOCATION:                               FACILITY:  [facility]   PHYSICIAN:  Armanda Magic, M.D.     DATE OF BIRTH:  [birth year]   DATE OF PROCEDURE:  07/06/2004  DATE OF DISCHARGE:                              CARDIAC CATHETERIZATION   PROCEDURES:  1.  Left heart catheterization.  2.  Coronary angiography.  3.  Left ventriculography.   OPERATOR:  Armanda Magic, MD.   INDICATIONS:  Shortness of breath.   COMPLICATIONS:  None.   IV ACCESS:  Via the right femoral artery 6 French sheath.   IV MEDICATIONS:  1.  Versed 2 mg.  2.  Fentanyl 25 mcg.  3.  Lopressor 5 mg IV.  4.  IV nitroglycerin at 3 mL per minute.   This is a very pleasant 55 year old black male with a history of  hypertension and hyperlipidemia who also has a history of tobacco abuse, and  presented with episodes of dyspnea on exertion, worrisome for angina  equivalent.  He had a slight elevation in his troponins, but CK-MBs were  normal and he now presents for heart catheterization.   The patient was brought to the Cardiac Catheterization Laboratory in a  fasting nonsedated state.  Informed consent was obtained.  The patient was  connected to continuous heart rate and pulse oximetry monitoring and blood  pressure monitoring.  The right groin was prepped and draped in a sterile  fashion.  Xylocaine 1% was used for local anesthesia.  The patients blood  pressure was significantly elevated due to anxiety plus poorly controlled  hypertension and he was given Versed 2 mg and Fentanyl 25 mcg IV, Lopressor  5 mg IV and started on an IV nitroglycerin drip.  Blood pressure at the  beginning of the femoral arterial stick was 144/90.  Using modified  Seldinger technique a 6 French sheath was placed in the right femoral  artery.  Under fluoroscopic guidance a 6 [place] JR4 catheter was placed in  the left  coronary artery.  Multiple cinefilms were taken in the 30 degree  RAO, 40 degree LAO view.  This catheter was then exchanged over a guide wire  for a 6 [place] JR4 catheter which was placed under fluoroscopic guidance in  the vicinity of the right coronary ostium, but could not adequately engage  it, the catheter was exchanged out for a 6 [place] __________right coronary  artery catheter which was placed in the right coronary ostium under  fluoroscopic guidance.  Multiple cinefilms were taken, a 30 degree RAO, 40  degree LAO view.  This catheter was then exchanged over a guide wire for a 6  French angled pigtail catheter which was placed under fluoroscopic guidance  in the left ventricular __________.  Left ventriculography was performed in  30 degree RAO view, a total of 30 mL of contrast at 15 mL per second.  The  catheter was then pulled back across the aortic valve with no significant  gradient noted.  At the end of the  procedure catheters and sheaths were  removed.  Manual compression was performed until adequate hemostasis was  obtained.  The patient was transferred back to his room in stable condition.   RESULTS:  1.  The left main coronary artery is widely patent and bifurcates into the      left anterior descending artery and left circumflex artery.  Left      anterior descending artery is widely patent throughout its course to the      apex and gives rise to three diagonal branches.  The first diagonal      branch has as 20% ostial narrowing, the other two diagonal branches are      widely patent.  There are luminal irregularities in the LAD.  2.  The left circumflex is widely patent throughout its course and travels      the AV groove, it gives rise to a large obtuse marginal branch which has      a 50-60% mid narrowing.  3.  The right coronary artery is widely patent throughout its course and      bifurcates distally in the posterior descending and posterior lateral      artery,  both of which are widely patent.  4.  Left ventriculography shows normal left ventricle systolic function with      trial mitral regurgitation, moderate left ventricular hypertrophy,      aortic pressure 143/99 mmHg, left ventricular pressure 146/5 mmHg, left      ventricular end-diastolic pressure 10-15 mmHg.   ASSESSMENT:  1.  Normal left ventricular size and systolic function with trivial mitral      regurgitation.  2.  Nonobstructive coronary disease primarily of the obtuse marginal 1.  3.  Shortness of breath most likely secondary to diastolic dysfunction with      poorly controlled hypertension.  4.  Poorly controlled hypertension.   PLAN:  1.  Increase Lopressor to 50 mg b.i.d.  2.  Increase HCTZ to 25 mg daily.  3.  Change Cozaar to Avapro 150 mg daily.  4.  Bedrest for six hours as well as intravenous fluids.  5.  Needs to stay overnight secondary to poorly controlled hypertension.  6.  Aspirin 325 mg daily.      TT/MEDQ  D:  07/06/2004  T:  07/06/2004  Job:  [id number]

## 2010-09-24 NOTE — Discharge Summary (Signed)
NAME:  Daniel Perkins, Daniel Perkins NO.:  [id number]   MEDICAL RECORD NO.:  [id number]          PATIENT TYPE:  INP   LOCATION:  5011                         FACILITY:  [facility]   PHYSICIAN:  Nadara Mustard, MD     DATE OF BIRTH:  Mar 30, 1956   DATE OF ADMISSION:  04/05/2006  DATE OF DISCHARGE:  04/17/2006                               DISCHARGE SUMMARY   DIAGNOSIS:  Infection, left foot, with retained hardware.   POSTOPERATIVE DIAGNOSIS:  Infection, left foot, with retained hardware.   PROCEDURE:  1. Removal of deep retained hardware, left foot.  2. Irrigation and debridement of skin, soft tissue and bone.  3. Placement of antibiotic beads with vancomycin and tobramycin as      well as application of a wound vacuum-assisted closure.   DISPOSITION:  Discharged to home in stable condition with home [facility] V.A.C. changes, Monday/Wednesday/Friday, plan to follow up in  the office in 1 week.   HISTORY OF PRESENT ILLNESS:  The patient is a 55 year old gentleman,  status post a triple arthrodesis, who has had a nonhealing wound over  the lateral aspect of his left foot.  Despite conservative care, the  wound has progressed with increasing necrotic tissue and he presents at  this time for surgical debridement and removal of retained hardware.   HOSPITAL COURSE:  The patient's hospital course was essentially  unremarkable.  He underwent surgical intervention on April 05, 2006  with removal of hardware, irrigation, debridement, placement of  antibiotic beads and application of a wound V.A.C.  He received  clindamycin 600 mg IV.  A tourniquet was not used and cultures were  obtained x2.  The patient was placed on a wound V.A.C. set at -75 mmHg  and a PICC line was set up for home antibiotics.  The patient continued  on the clindamycin and the antibiotic beads.  Cultures were positive for  gram-negative rods.  The patient's cultures grew out Group B strep as  well as  Pseudomonas.  The patient's antibiotics were added to include  Cipro for the bacterial coverage.  The wound V.A.C. was changed on  April 10, 2006.  The patient was having difficulty obtaining coverage  for the wound V.A.C. and the patient remained hospitalized due to  difficulty obtaining coverage financially for the wound V.A.C.  I have  discussed with the patient the possibility of muscle transfer into the  wound to facilitate healing.  The patient underwent repeat irrigation  and debridement on April 13, 2006 with irrigation and debridement,  transfer of the abductor digiti minimi into the wound bed, application  of the wound V.A.C. as well as repeated irrigation and debridement.  The  patient was on  clindamycin and Cipro preoperatively.  A tourniquet was not used.  The  patient progressed well after the muscle transfer.  He was discharged to  home in stable condition after a wound V.A.C. change with followup in  the office in 1 week with [facility] providing wound V.A.C.  changes, Monday/Wednesday/Friday.      Nadara Mustard, MD  Electronically Signed  MVD/MEDQ  D:  05/18/2006  T:  05/19/2006  Job:  [id number]

## 2010-09-24 NOTE — Discharge Summary (Signed)
NAME:  SNYDER, COLAVITO NO.:  [id number]   MEDICAL RECORD NO.:  [id number]            PATIENT TYPE:   LOCATION:                                 FACILITY:   PHYSICIAN:  Nadara Mustard, MD          DATE OF BIRTH:   DATE OF ADMISSION:  01/10/2007  DATE OF DISCHARGE:  01/12/2007                               DISCHARGE SUMMARY   FINAL DIAGNOSIS:  Osteomyelitis and abscess left hind foot.   PROCEDURE:  Irrigation debridement skin, soft tissue, and bone with  resection of the talus calcaneus and hind foot, placement of antibiotic  beads.   Discharged to home with doxycycline and Cipro for antibiotic  prophylaxis.   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  He most recently has  undergone irrigation, debridement, and placement of antibiotic beads and  at this time represents for revision surgery, and removal of antibiotic  beads, as well as placement of new antibiotic beads.  The patient's  hospital course was essentially unremarkable.  He underwent removal of  the current antibiotic beads, irrigation, and debridement of the skin,  soft tissue,. and bone of the hind foot; and placement of new antibiotic  beads with vancomycin and tobramycin.  Cultures were obtained x2.  The  patient received Kefzol for infection prophylaxis perioperatively.   Postoperatively the patient progressed well.  The dressing was dry.  Culture sensitivities were pending.  Initial gram stains were positive  for gram-negative rods and the patient was started on Cipro.  The  patient's sensitivities were pending, and he was discharged on  doxycycline and Cipro and follow-up in the office in 2 weeks with  planned adjustment of the antibiotics pending culture sensitivities.      Nadara Mustard, MD  Electronically Signed     MVD/MEDQ  D:  02/28/2007  T:  03/01/2007  Job:  [phone]
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
## 2010-09-24 NOTE — Op Note (Signed)
NAME:  Daniel Perkins, Daniel Perkins NO.:  [id number]   MEDICAL RECORD NO.:  [id number]          PATIENT TYPE:  INP   LOCATION:  5021                         FACILITY:  [facility]   PHYSICIAN:  Sharolyn Douglas, M.D.        DATE OF BIRTH:  1955/05/14   DATE OF PROCEDURE:  08/17/2005  DATE OF DISCHARGE:                                 OPERATIVE REPORT   DIAGNOSES:  1.  Cervical spondylotic radiculopathy, C6-7.  2.  History of previous cervical fusion, C4 through C6.  3.  Subjacent spondylosis, C6-7.   PROCEDURES:  1.  Anterior cervical diskectomy, C6-7, with decompression of the nerve      roots bilaterally.  2.  Anterior cervical arthrodesis, C6-7, and placement of 6 mm allograft      prosthesis spacer packed with iliac crest bone graft.  3.  Anterior cervical plating, C6-7, using the Abbott spine system.  4.  Right anterior iliac crest bone graft.  5.  Exploration of C5-6 fusion.   SURGEON:  Sharolyn Douglas, M.D.   ASSISTANT:  Verlin Fester, P.A.   ANESTHESIA:  General endotracheal.   ESTIMATED BLOOD LOSS:  Minimal.   COMPLICATIONS:  None.   INDICATIONS:  The patient is a pleasant male with past history significant  for multiple previous neck surgeries, including C4 through C6 ACDF.  He has  developed severe subjacent degenerative changes.  He now presents for  extension of his fusion across the C6-7 segment.  He is a smoker and has had  problems with pseudoarthrosis in the past; therefore, we elected to take a  right anterior iliac crest bone graft.  Risk and benefits were reviewed and  he agreed to proceed.   PROCEDURE:  He was identified in the holding area, taken to the operating  room, and underwent general endotracheal anesthesia without difficulty,  given prophylactic IV antibiotics.  Carefully positioned on the [place]  head rest.  His neck was prepped and draped in the usual sterile fashion.  A  small transverse incision was made at the level cricoid cartilage, left  side.  Dissection was carried through the previous scar.  The interval  between the SCM and strap muscles medially was developed down to the  prevertebral space.  Using blunt dissection the anterior cervical spine was  exposed, identifying the previous plate at Z3-0 and the previous fusion at  C5-6.  The C5-6 fusion was explored using the electrocautery and found to be  solid.  We then extended the exposure down to C6-7.  We found the disk to be  severely degenerative and fairly immobile.  We placed Caspar distraction  pins in the C6 and C7 vertebral bodies.  Deep retractors were placed.  Intraoperative x-ray was not taken because the levels could be determined  from the previous anterior instrumentation.  We began a diskectomy through  the anterior annulus.  This was carried back to the posterior longitudinal  ligament.  There were large uncovertebral spurs.  High-speed bur used to  take down the uncovertebral joints and spurs as well as the posterior  vertebral margins.  Wide foraminotomies were completed  using micro Kerrison  punch.  Once we were satisfied with the decompression and hemostasis was  achieved, we turned our attention to obtaining a right anterior iliac crest  bone graft.  A 2 cm incision was made over the anterior iliac crest.  Dissection was carried down through the deep fascia.  Using a 7 mm trocar,  three cores of bone were removed from the iliac crest.  The wound was  irrigated.  This wound was closed with 2-0 Vicryl and Dermabond on the skin.  We then packed a 6 mm allograft prosthesis spacer with the anterior iliac  crest bone graft.  This was inserted into the interspace, countersunk 1 mm.  We then removed the Caspar distraction pins.  We placed a 22 mm anterior  cervical plate with four 12 mm screws.  We had excellent screw purchase.  We  ensured that the locking mechanism engaged.  The wound was irrigated.  Hemostasis was achieved.  The esophagus, trachea and  carotid sheath were  inspected, and there were no apparent injuries.  The deep Penrose drain was  left in place.  Deep layer closed with 2-0 Vicryl, subcutaneous layer closed  with 3-0 Vicryl followed by a running 4-0 subcuticular Vicryl suture on the  skin edges.  Benzoin and Steri-Strips placed.  Sterile dressing applied.  Cervical collar was placed.  The patient was extubated without difficulty  and transferred to recovery in stable condition able to move his upper and  lower extremities.  Needle and sponge count was correct.      Sharolyn Douglas, M.D.  Electronically Signed     MC/MEDQ  D:  08/17/2005  T:  08/18/2005  Job:  [id number]

## 2010-09-24 NOTE — Discharge Summary (Signed)
NAME:  Daniel Perkins, AIKEY NO.:  [id number]   MEDICAL RECORD NO.:  [id number]          PATIENT TYPE:  INP   LOCATION:  5024                         FACILITY:  [facility]   PHYSICIAN:  Nadara Mustard, MD     DATE OF BIRTH:  Sep 17, 1955   DATE OF ADMISSION:  08/18/2004  DATE OF DISCHARGE:  08/21/2004                                 DISCHARGE SUMMARY   DIAGNOSIS:  Talonavicular collapse right foot status post multiple club foot  surgeries.   PROCEDURE:  Talonavicular midfoot fusion. Discharged to home in stable  condition. Patient to be nonweightbearing. Will plan for a short leg cast  placement. We will set up home health physical therapy and follow up in the  office in 2 weeks.   HISTORY OF PRESENT ILLNESS:  The patient is a 55 year old gentleman with a  congenital club foot. He has undergone multiple club foot surgeries and has  had progressive talonavicular collapse with avascular necrosis. He has a  good dorsalis pedis pulse. He is unable weight-bear due to pain and  deformity of his foot and wished to proceed with surgical intervention after  failure of conservative care. Risks and benefits were discussed including  infection, neurovascular injury, and the patient states he understands and  wishes to proceed at this time.   The patient's hospital course was essentially unremarkable. He underwent a  right midfoot fusion. Postoperatively, he was started with physical therapy  with progressive ambulation, nonweightbearing on the right. The patient  needed significant amount of assistance with ambulation and it was felt the  patient may need rehab stay. The patient, however, did progress well with  therapy and was discharged to home in stable condition on August 21, 2004. We  will set him up with home health physical therapy, prescriptions for Vicodin  and Tylox for pain, and follow-up in office in 2 weeks.       MVD/MEDQ  D:  12/02/2004  T:  12/02/2004  Job:  [id number]

## 2010-09-24 NOTE — H&P (Signed)
NAME:  Daniel Perkins, Daniel Perkins NO.:  [id number]   MEDICAL RECORD NO.:  [id number]          PATIENT TYPE:  INP   LOCATION:  0103                         FACILITY:  [facility]   PHYSICIAN:  Hollice Espy, M.D.DATE OF BIRTH:  1955-12-24   DATE OF ADMISSION:  07/04/2004  DATE OF DISCHARGE:                                HISTORY & PHYSICAL   PRIMARY CARE PHYSICIAN:  Dr. Lanell Persons   CHIEF COMPLAINT:  Shortness of breath.   The patient is a 55 year old white male with past medical history of  hypertension and recently diagnosed hyperlipidemia as well as tobacco abuse,  who presents with a 1 day history of dyspnea on exertion.  The patient has  been previously well with no complaints.  He does continue to smoke.  He has  recently been started on Lipitor for high cholesterol.  Starting yesterday,  he noted that with even moderate exertion, he started having marked  shortness of breath.  He had no fevers or chills, no coughing or wheezing,  but he had a hard time catching his breath.  In addition, he started  noticing problems with numbness involving the left side of his mouth and  face as well as going down his left arm into his hand.  He had never had  symptoms like this prior.  He felt when sitting at rest and not moving, he  felt these symptoms abate.  He continued to have problems with this and came  into the emergency room today.  On arrival to the emergency room, he was  saturating 96% on room air.  His heart rate was slightly elevated in the  110s, but the rest of his vitals were unremarkable.  His chest x-ray was  negative.  White count was normal with no shift.  His creatinine was  slightly elevated at about 1.5.  A BNP was normal.  His first set of cardiac  enzymes were slightly elevated with troponin I of 0.11, but his CPK-MB was  normal at 125 and 3.8.  A second set checked approximately 1 hour later  showed a decreased troponin I of 0.07.  His CPK and MB was  normal at 100 and  2.5.  EKG, as told to me by the ER physician, Dr. Lynelle Doctor, was described as  left atrial enlargement with questionable Q waves in the inferior leads, but  apparently this was an old finding.  I am not able to find the EKG, and it  is not present.  The patient himself is doing well currently.  He says he is  not having any shortness of breath; he feels okay.  He denies any headaches,  dysphagia, palpitations, chest pain, or pressure which he has not had.  He  denies any coughing or wheezing.  His shortness of breath earlier is now  much better.  He denies any abdominal pain, hematuria, dysuria, diarrhea.  He has been complaining of some constipation issues that have been going on  for a while as well as some vision changes that have been occurring in the  last few months where he now needs to have  text much closer for him to read  it.   PAST MEDICAL HISTORY:  1.  Hypertension.  2.  Hyperlipidemia.   MEDICATIONS:  1.  Hyzaar 50/12.5 daily.  2.  Lipitor 10 which he started 3 weeks ago.   ALLERGIES:  He has no known drug allergies.   SOCIAL HISTORY:  He denies any alcohol or drug use.  He is a half-pack a day  smoker but previously a few months ago was up to a pack a day.  He denies  any drugs.   FAMILY HISTORY:  Positive for coronary artery disease, including a brother  who died of possible MI.   PHYSICAL EXAMINATION:  VITAL SIGNS:  Heart rate 104, temp 97.4, blood  pressure 118/80, respirations 20, O2 saturation 96% on room air.  GENERAL:  The patient is alert and oriented x 3.  No apparent distress.  HEENT:  Normocephalic, atraumatic.  His mucous membranes are moist and no  carotid bruits.  HEART:  Regular rate and rhythm, S1 and S2.  LUNGS:  Clear to auscultation bilaterally.  ABDOMEN:  Soft, nontender, nondistended, positive bowel sounds.  EXTREMITIES:  No cyanosis, clubbing, or edema.   LABORATORY DATA:  White count of 9.2 with 60% neutrophils which is  normal.  H&H 14.5, 43.8.  MCV is low at 69, platelet count 320.  Sodium 137,  potassium 3.8, chloride 106, bicarb 27, BUN 20, creatinine 1.5, glucose 112,  calcium 9.9.  BNP is normal at less than 30.  CPK-MB first set 125 and 3.8.  Second set 100 and 2.5, troponin I 0.11. Second set is 0.07.   ASSESSMENT/PLAN:  1.  Atypical chest pain.  Specifically, 1 day history of marked dyspnea on      exertion, numbness of the left side of face and left arm relieved with      rest.  No other symptoms.  Enzymes are unremarkable, although troponin I      is slightly elevated.  Chest x-ray is normal.  Questionable findings on      EKG.  Will admit, check serial enzymes, stress test in the morning.      Have [facility] to do.  2.  Hyperlipidemia.  Just started on Lipitor 3 weeks ago.  We will continue.  3.  Hypertension.  Continue Cozaar.  4.  Decreased MCV.  We will check iron studies.      SKK/MEDQ  D:  07/04/2004  T:  07/04/2004  Job:  [id number]   cc:   Vikki Ports, M.D.  [address]  [place]  [place] [address]  Fax: [phone]

## 2010-09-24 NOTE — Discharge Summary (Signed)
NAME:  Daniel Perkins, HYSON NO.:  [id number]   MEDICAL RECORD NO.:  [id number]                   PATIENT TYPE:  INP   LOCATION:  5725                                 FACILITY:  [facility]   PHYSICIAN:  Sherin Quarry, MD                   DATE OF BIRTH:  1955-08-24   DATE OF ADMISSION:  03/14/2002  DATE OF DISCHARGE:  03/18/2002                                 DISCHARGE SUMMARY   HISTORY OF PRESENT ILLNESS:  The patient is a 55 year old man who initially  presented on March 15, 2002 with acute onset approximately 2 o'clock that  morning of severe cramping lower abdominal pain associated with a feeling of  tenesmus.  During the previous day, he had had two loose stools.  On  presentation to the emergency room, he was noted to have a white count at  20,800, hemoglobin was 13.1, BMET was within normal limits, liver functions  were normal.  He was sent for a CT scan of the abdomen, which showed  evidence of circumferential thickening and inflammation in the area of the  terminal ileum; for this reason, decision was made to admit him at that  time.   PHYSICAL EXAMINATION:  Physical exam at the time of admission was performed  by Dr. Maryruth Hancock Campolattaro.  HEENT exam was within normal limits.  The  chest was clear.  Cardiovascular exam revealed normal S1 and S2, without  murmurs, rubs, or gallops.  The abdomen was remarkable for diffuse  tenderness in the left lower quadrant area without guarding or rebound.  Neurologic testing and examination of extremities were normal.  On rectal  exam, the patient had guaiac-negative stool.   HOSPITAL COURSE:  Dr. Jonell Cluck placed the patient empirically on Cipro  400 mg IV every 12 hours and Flagyl 500 mg IV every six hours.  She  continued his usual blood pressure medications and administered IV hydration  in the form of normal saline, initially at 200 cc/hr and subsequently at 100  cc/hr.  Consultation was obtained from  Dr. Everardo All. Madilyn Fireman and Dr. Petra Kuba of the GI service.  They reviewed the patient's CT scan and discussed  with him possible management; options for management would have included  either immediate colonoscopy or delaying this procedure until the patient's  status had returned to baseline.  Over the next few days, the patient's  bowel function returned to normal, abdominal pain gradually resolved.  A  further discussion was carried out with the patient on March 18, 2002 by  Dr. Ewing Schlein and decision was made to delaying doing a colonoscopy at this  time, therefore, as the patient was feeling well on March 18, 2002, he  was discharged.   DISCHARGE DIAGNOSES:  1. Lower abdominal pain, possible inflammatory bowel disease with evidence     of inflammation of the terminal ileum.  2. History of hypertension.  3.  History of low back pain.   DISCHARGE MEDICATIONS:  1. Lotensin 20 mg daily.  2. Norvasc 5 mg daily.  3. Potassium chloride 20 mEq daily.  4. MiraLax p.r.n. for constipation.    FOLLOWUP:  The patient was advised to contact Dr. Ewing Schlein at [phone] with an  update two days after discharge.  At that time, he was also instructed to  set up an appointment to see Dr. Ewing Schlein back in two to three weeks.  He was  also advised to follow up with Dr. Jenita Seashore at [facility] in [facility]  in regard to management of his hypertension.                                               Sherin Quarry, MD    SY/MEDQ  D:  03/18/2002  T:  03/19/2002  Job:  [id number]   cc:   Petra Kuba, M.D.  [address]  [place]  [place] [address]  Fax: [phone]   [facility], [place], [place] Jenita Seashore M.D.

## 2010-09-24 NOTE — Op Note (Signed)
NAME:  TIN, ENGRAM NO.:  [id number]   MEDICAL RECORD NO.:  [id number]          PATIENT TYPE:  INP   LOCATION:  5011                         FACILITY:  [facility]   PHYSICIAN:  Nadara Mustard, MD     DATE OF BIRTH:  1956-02-08   DATE OF PROCEDURE:  04/13/2006  DATE OF DISCHARGE:                               OPERATIVE REPORT   PREOP DIAGNOSIS:  Necrotic ulcer lateral border left foot.   POSTOP DIAGNOSIS:  Necrotic ulcer lateral border left foot.   PROCEDURE:  1. Irrigation and debridement of skin, soft tissue and bone.  2. Transfer of the abductor digiti minimi to the base of the wound.  3. Application of the wound-vac.   SURGEON:  Aldean Baker, M.D.   ANESTHESIA:  General.   ESTIMATED BLOOD LOSS:  Minimal.   ANTIBIOTICS:  Clindamycin and Cipro preoperatively.   DRAINS:  None.   COMPLICATIONS:  None.   TOURNIQUET TIME:  None.   DISPOSITION:  To PACU in stable condition.   INDICATIONS FOR PROCEDURE:  The patient is a 55 year old gentleman who  is status post triple arthrodesis.  He has good healing of the dorsal  wound, however, the lateral wound has showed a slow progression of  nonhealing.  The patient was advised regarding the use of tobacco  products and he continued to smoke and the wound continued to breakdown.  The patient presented earlier on April 05, 2006 with irrigation and  debridement of the wound, removal of retained hardware, and presents at  this time for repeat irrigation and debridement after application of a  wound-vac for abductor digiti minimi transfer as well as irrigation and  debridement and reapplication of a wound-vac with disposition  with a home wound-vac.  The risks and benefits of surgery were discussed  including infection, neurovascular injury, nonhealing of the wound,  potential for an amputation.  The patient states he understands and  wished to proceed at this time.   DESCRIPTION OF PROCEDURE:  The patient was  brought to OR room one and  underwent a general anesthetic.  After an adequate level of anesthesia  was obtained the patient's left lower extremity was prepped using  Betadine and draped in a sterile field.  The left foot was then draped  into a sterile field.  The wound-vac was removed prior to irrigation and  debridement and the bone was debrided with curette back to bleeding  viable bone.  The wound was irrigated with pulse lavage and there was  good viable petechial bleeding edges around the wound edge.  There was a  very large deep wound and it was approximately 4 cm in depth, 4 cm in  diameter.   A separate incision was then made along the vermilion border of the foot  along the fifth metatarsal.  The abductor digiti minimi was released  distally and this was released.  It was still attached to its proximal  vascular pedicle on the branch from the lateral plantar artery.  The  muscle flap was then swung into the wound base and was sutured in  position.  The wound was  then partially closed using 2-0 nylon and the  surgical incision was closed using 2-0 nylon with a vertical mattress  suture.  The wound was then covered with a wound-vac.  This sealed at -  75 mmHg.  The muscle still had good viability after the transfer.  The  patient was then extubated, taken to the PACU in stable condition.  Plan  for discharge to home with serial wound-vac changes.      Nadara Mustard, MD  Electronically Signed     MVD/MEDQ  D:  04/13/2006  T:  04/14/2006  Job:  [id number]

## 2010-09-24 NOTE — Discharge Summary (Signed)
NAME:  Daniel Perkins, Daniel Perkins NO.:  [id number]   MEDICAL RECORD NO.:  [id number]          PATIENT TYPE:  INP   LOCATION:  6705                         FACILITY:  [facility]   PHYSICIAN:  Nadara Mustard, MD     DATE OF BIRTH:  02/07/1956   DATE OF ADMISSION:  07/25/2006  DATE OF DISCHARGE:  07/28/2006                               DISCHARGE SUMMARY   DIAGNOSIS:  Chronic osteomyelitis and necrotic wound, left foot.   PROCEDURES:  1. Irrigation and debridement of skin, soft tissue, and bone.  2. Application of antibiotic beads with vancomycin and tobramycin.  3. Application of wound VAC.   Discharged to home in stable condition.  Antibiotics:  Vancomycin IV  throughout his hospital stay.  Cultures obtained x 2.   HISTORY OF PRESENT ILLNESS:  The patient is a 55 year old gentleman who  is status post a triple arthrodesis for his left foot.  The patient had  developed a wound breakdown laterally secondary to tobacco use.  The  patient failed conservative wound care and developed osteomyelitis.  The  patient has undergone several procedures for irrigation and debridement  of his osteomyelitis.   The patient's hospital course was essentially unremarkable.  He  underwent an irrigation and debridement of skin, soft tissue, and bone;  placement of antibiotic beads including vancomycin and tobramycin; and  application of a wound VAC.  He received vancomycin for infection  prophylaxis and the tourniquet was not used throughout the case.   Postoperatively, the patient progressed well.  His gram stains were  positive for gram-negative rods and Cipro was added to the vancomycin.  [facility] was consulted for wound VAC therapy at home.  The  cultures were positive for Enterococci and the patient was given a  prescription for Cipro.   The patient is scheduled for discharge to home with Cipro for antibiotic  coverage and Tylox for pain management.  His wound VAC was approved  and  the patient was discharged to home in stable condition on July 28, 2006  and antibiotic coverage included doxycycline for the final culture  results, which were available at the time of discharge.      Nadara Mustard, MD  Electronically Signed     MVD/MEDQ  D:  09/07/2006  T:  09/07/2006  Job:  [id number]

## 2010-09-24 NOTE — Op Note (Signed)
NAME:  AVERI, CACIOPPO NO.:  [id number]   MEDICAL RECORD NO.:  [id number]          PATIENT TYPE:  INP   LOCATION:  2899                         FACILITY:  [facility]   PHYSICIAN:  Nadara Mustard, MD     DATE OF BIRTH:  March 22, 1956   DATE OF PROCEDURE:  07/13/2006  DATE OF DISCHARGE:                               OPERATIVE REPORT   PREOPERATIVE DIAGNOSIS:  Osteomyelitis left foot involving the fifth  metatarsal cuboid and calcaneus.   POSTOPERATIVE DIAGNOSIS:  Osteomyelitis left foot involving the fifth  metatarsal cuboid and calcaneus.   PROCEDURE:  Irrigation debridement and partial excision of the base of  the fifth metatarsal, cuboid, calcaneus as well as placement of  allograft after pulse lavage.   SURGEON:  Nadara Mustard, MD   ANESTHESIA:  General.   ESTIMATED BLOOD LOSS:  Minimal.   ANTIBIOTICS:  1 gram of Kefzol.   DRAINS:  None.   COMPLICATIONS:  None.   TOURNIQUET TIME:  None.   DISPOSITION:  To PACU in stable condition.   INDICATIONS FOR PROCEDURE:  The patient is a 55 year old gentleman  status post triple arthrodesis who has developed infection laterally.  The patient's most pain is over the base of the fifth metatarsal.  Radiograph shows lytic changes of the base of the fifth metatarsal.  He  does have a small chronic wound over the sinus tarsi area.  Due to  failure of conservative care with p.o. antibiotics and wound care, the  patient presents at this time for repeat irrigation debridement.   DESCRIPTION OF PROCEDURE:  The patient is brought to OR room one and  underwent a general anesthetic.  After adequate level of anesthesia  obtained the patient's left lower extremity was prepped using DuraPrep  and draped into a sterile field.  The initial oblique sinus tarsi  incision was used.  This was carried down to the wound and curette was  used to debride the bone back to bleeding viable bone.  Pulse lavage was  used.  The lateral incision  over the fifth metatarsal was also used.  There was no abscess but he did have radiographic changes with lytic  changes radiographically at the base of fifth metatarsal.  The base of  the fifth metatarsal and partial aspect of the cuboid was excised.  Both  wounds communicated, this was irrigated and debrided with pulse lavage.  The deep wound with  good bleeding bone was then packed with bone graft.  The wounds were  closed loosely with the wounds loosely approximated with 2-0 nylon with  a far-near-near-far suture.  A wound VAC was applied set at -75 mmHg.  The patient was then extubated, taken to PACU in stable condition.      Nadara Mustard, MD  Electronically Signed     MVD/MEDQ  D:  07/13/2006  T:  07/13/2006  Job:  [id number]

## 2010-09-24 NOTE — Discharge Summary (Signed)
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   PROCEDURE:  Left foot triple arthrodesis.   Discharged to home in stable condition.   HISTORY OF PRESENT ILLNESS:  The patient is a 55 year old gentleman who  is status post club foot deformity to both feet.  He has been having  pain with activities of daily living.  He has subtalar arthrosis on the  left foot, has failed conservative care and wishes to proceed with  surgical intervention at this time.   The patient's hospital course was essentially unremarkable.  He  underwent a triple arthrodesis of the left foot on October 17.  He  received Kefzol for infection prophylaxis.  Postoperatively, the patient  progressed well.  He was able to ambulate independently with touchdown  weightbearing.  He was given instructions to discontinue smoking, to  keep his foot elevated, protective weightbearing.  Plan to follow up in  the office in 1 week.      Nadara Mustard, MD  Electronically Signed     MVD/MEDQ  D:  03/31/2006  T:  03/31/2006  Job:  [id number]

## 2010-09-24 NOTE — Discharge Summary (Signed)
NAME:  Daniel Perkins, Daniel Perkins NO.:  [id number]   MEDICAL RECORD NO.:  [id number]          PATIENT TYPE:  INP   LOCATION:  4737                         FACILITY:  [facility]   PHYSICIAN:  Deirdre Peer. Polite, M.D. DATE OF BIRTH:  08-24-1955   DATE OF ADMISSION:  07/05/2004  DATE OF DISCHARGE:  07/08/2004                                 DISCHARGE SUMMARY   DISCHARGE DIAGNOSES:  1.  Chest pain.  2.  Heart cath for nonobstructive coronary artery disease .  Echo with no      abnormalities.  Cleared for discharge by cardiology.  3.  Hypertension.  4.  High cholesterol.   DISCHARGE MEDICATIONS:  1.  Avalide 250/12.5.  2.  Lipitor 10 mg daily.  3.  Enteric-coated aspirin 325 daily.  4.  Lopressor 50 mg b.i.d.  5.  Cardizem 120 mg daily.   DISPOSITION:  The patient is being discharged to home.  Asked to followup  with cardiology July 23, 2004.  Is scheduled for an outpatient cardiology  stress test.   STUDIES:  Cardiac catheterization:  Nonobstructive coronary artery disease.  Cardiac enzymes negative for ischemia.   HISTORY OF PRESENT ILLNESS:  This 55 year old male with the above medical  problems presented to the ED after having dyspnea on exertion.  Because of  the patient's risk factors admission was being felt necessary for further  evaluation and treatment.  Please see dictated H&P for further details.   PAST MEDICAL HISTORY:  As noted above. Significant for hypertension,  hyperlipidemia.   ADMISSION MEDICATIONS:  Hyzaar and Lipitor.   ALLERGIES:  No known drug allergies.   FAMILY HISTORY:  Positive for coronary artery disease .   HOSPITAL COURSE:  This 55 year old male admitted to the hospital for  evaluation and treatment of chest pain.  The patient had serial cardiac  enzymes and EKG with not acute abnormalities.  Cardiology consultation was  obtained.  The patient underwent cardiac catheterization and showed  nonobstructive coronary disease.  Question if his  shortness of breath was  secondary to diastolic dysfunction.  The patient's medications were  titrated.  The patient  also had CT of the chest to rule out PE which was negative.  The patient  also had a 2-D echo which did not show any abnormalities.  The patient was  cleared for discharged.  Asked to followup with cardiology on an outpatient  basis for further evaluation.  At this time the patient has been cleared for  discharge.       RDP/MEDQ  D:  10/06/2004  T:  10/06/2004  Job:  [id number]

## 2010-09-24 NOTE — Discharge Summary (Signed)
NAME:  Daniel Perkins, BRENTLINGER NO.:  [id number]   MEDICAL RECORD NO.:  [id number]          PATIENT TYPE:  INP   LOCATION:  5010                         FACILITY:  [facility]   PHYSICIAN:  Nadara Mustard, MD     DATE OF BIRTH:  1955-07-11   DATE OF ADMISSION:  07/13/2006  DATE OF DISCHARGE:  07/18/2006                               DISCHARGE SUMMARY   FINAL DIAGNOSIS:  Osteomyelitis, left foot, fifth metatarsal cuboid and  calcaneus.   PROCEDURE:  Irrigation debridement with partial excision of the fifth  metatarsal cuboid and calcaneus.   The patient received Kefzol for infection prophylaxis.   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  A wound vac was placed to improve granulation tissue  and improve wound healing.  The patient was also started on  nitroglycerin patches to improve local arterial circulation.  The  patient was discharged to home on July 18, 2006 with continuation of  his wound vac.  Antibiotics with doxycycline, a nitroglycerin patch, as  well as Vicodin for pain.  Follow-up in the office in 1 week.      Nadara Mustard, MD  Electronically Signed     MVD/MEDQ  D:  08/24/2006  T:  08/24/2006  Job:  [id number]
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
## 2010-09-24 NOTE — Op Note (Signed)
NAME:  Daniel Perkins, Daniel Perkins NO.:  [id number]   MEDICAL RECORD NO.:  [id number]          PATIENT TYPE:  INP   LOCATION:  6705                         FACILITY:  [facility]   PHYSICIAN:  Nadara Mustard, MD     DATE OF BIRTH:  1955/10/14   DATE OF PROCEDURE:  07/25/2006  DATE OF DISCHARGE:                               OPERATIVE REPORT   PREOPERATIVE DIAGNOSIS:  Osteomyelitis and necrotic wound, left foot.   POSTOPERATIVE DIAGNOSIS:  Osteomyelitis and necrotic wound, left foot.   PROCEDURE:  1. Irrigation debridement skin, soft tissue and bone.  2. Placement of antibiotic beads with vancomycin and tobramycin.  3. Application of wound vac.   SURGEON:  Nadara Mustard, MD   ANESTHESIA:  General.   ESTIMATED BLOOD LOSS:  Minimal.   ANTIBIOTICS:  Vancomycin 1 gram after cultures obtained x2.   TOURNIQUET TIME:  None.   DISPOSITION:  To PACU in stable condition.   INDICATIONS FOR PROCEDURE:  Patient is a 55 year old gentleman status post a triple arthrodesis who  had developed wound breakdown laterally.  All other wounds healed well.  The patient has undergone multiple treatments for irrigation debridement  and wound healing and presents at this time with recurrent breakdown of  the wound with osteomyelitis.  Due to the purulent drainage the patient  presents at this time for urgent surgical intervention.  Risks and  benefits were discussed including persistent infection, neurovascular  injury, nonhealing wound, potential for a transtibial amputation.  The  patient states he understands and wished to proceed at this time.   DESCRIPTION OF PROCEDURE:  The patient was brought to OR room 4 and  underwent general anesthetic.  After adequate level of anesthesia  obtained.  The sutures from the proximal oblique incision were removed.  The distal incision which was healing nicely, the sutures were left in  place.  The bone graft was removed.  The wound was irrigated with  pulse  lavage with 3 liters.  A curette was used to debride back to bleeding  viable bone.  There was good bleeding in the wound.  Deep cultures were  obtained prior to antibiotics of vancomycin 1 gram.  Antibiotic beads  were made with 1 gram of vancomycin and 1.2 grams of tobramycin.  This  was placed on a Prolene suture.  After debridement of wound, the beads were placed deep in the wound.  The wound was then wicked open with a wound vac sponge covered with  Ioban and this was set to minus 75 mmHg.  This had a good suction.  The  patient was extubated, taken to PACU in stable condition.  Plan for  discharge to home on a home wound vac.      Nadara Mustard, MD  Electronically Signed     MVD/MEDQ  D:  07/25/2006  T:  07/26/2006  Job:  [id number]

## 2010-10-01 ENCOUNTER — Emergency Department (HOSPITAL_COMMUNITY)
Admission: EM | Admit: 2010-10-01 | Discharge: 2010-10-01 | Disposition: A | Discharge status: Home / Self Care | Payer: Medicare Other | Attending: Emergency Medicine | Admitting: Emergency Medicine

## 2010-10-01 ENCOUNTER — Emergency Department (HOSPITAL_COMMUNITY): Payer: Medicare Other

## 2010-10-01 DIAGNOSIS — E119 Type 2 diabetes mellitus without complications: Secondary | ICD-10-CM | POA: Insufficient documentation

## 2010-10-01 DIAGNOSIS — I252 Old myocardial infarction: Secondary | ICD-10-CM | POA: Insufficient documentation

## 2010-10-01 DIAGNOSIS — Z91199 Patient's noncompliance with other medical treatment and regimen due to unspecified reason: Secondary | ICD-10-CM | POA: Insufficient documentation

## 2010-10-01 DIAGNOSIS — M25529 Pain in unspecified elbow: Secondary | ICD-10-CM | POA: Insufficient documentation

## 2010-10-01 DIAGNOSIS — Z9119 Patient's noncompliance with other medical treatment and regimen: Secondary | ICD-10-CM | POA: Insufficient documentation

## 2010-10-01 DIAGNOSIS — I10 Essential (primary) hypertension: Secondary | ICD-10-CM | POA: Insufficient documentation

## 2010-10-01 DIAGNOSIS — E785 Hyperlipidemia, unspecified: Secondary | ICD-10-CM | POA: Insufficient documentation

## 2010-10-01 LAB — DIFFERENTIAL
Basophils Absolute: 0 10*3/uL (ref 0.0–0.1)
Basophils Relative: 0 % (ref 0–1)
Monocytes Absolute: 1 10*3/uL (ref 0.1–1.0)
Neutro Abs: 6.1 10*3/uL (ref 1.7–7.7)
Neutrophils Relative %: 66 % (ref 43–77)

## 2010-10-01 LAB — CBC
Hemoglobin: 12.4 g/dL — ABNORMAL LOW (ref 13.0–17.0)
MCHC: 32.8 g/dL (ref 30.0–36.0)
RDW: 15 % (ref 11.5–15.5)

## 2010-10-15 ENCOUNTER — Emergency Department (HOSPITAL_COMMUNITY)
Admission: EM | Admit: 2010-10-15 | Discharge: 2010-10-15 | Disposition: A | Discharge status: Home / Self Care | Payer: Medicare Other | Attending: Emergency Medicine | Admitting: Emergency Medicine

## 2010-10-15 DIAGNOSIS — Z8639 Personal history of other endocrine, nutritional and metabolic disease: Secondary | ICD-10-CM | POA: Insufficient documentation

## 2010-10-15 DIAGNOSIS — E785 Hyperlipidemia, unspecified: Secondary | ICD-10-CM | POA: Insufficient documentation

## 2010-10-15 DIAGNOSIS — Z9889 Other specified postprocedural states: Secondary | ICD-10-CM | POA: Insufficient documentation

## 2010-10-15 DIAGNOSIS — Z862 Personal history of diseases of the blood and blood-forming organs and certain disorders involving the immune mechanism: Secondary | ICD-10-CM | POA: Insufficient documentation

## 2010-10-15 DIAGNOSIS — M7020 Olecranon bursitis, unspecified elbow: Secondary | ICD-10-CM | POA: Insufficient documentation

## 2010-10-15 DIAGNOSIS — I10 Essential (primary) hypertension: Secondary | ICD-10-CM | POA: Insufficient documentation

## 2010-10-15 DIAGNOSIS — M25529 Pain in unspecified elbow: Secondary | ICD-10-CM | POA: Insufficient documentation

## 2010-10-15 DIAGNOSIS — M25429 Effusion, unspecified elbow: Secondary | ICD-10-CM | POA: Insufficient documentation

## 2010-10-15 DIAGNOSIS — E119 Type 2 diabetes mellitus without complications: Secondary | ICD-10-CM | POA: Insufficient documentation

## 2010-10-15 DIAGNOSIS — I252 Old myocardial infarction: Secondary | ICD-10-CM | POA: Insufficient documentation

## 2010-10-15 DIAGNOSIS — Z79899 Other long term (current) drug therapy: Secondary | ICD-10-CM | POA: Insufficient documentation

## 2010-10-27 ENCOUNTER — Emergency Department (HOSPITAL_COMMUNITY)
Admission: EM | Admit: 2010-10-27 | Discharge: 2010-10-27 | Disposition: A | Discharge status: Other Acute Inpatient Hospital | Payer: Medicare Other | Source: Home / Self Care | Attending: Emergency Medicine | Admitting: Emergency Medicine

## 2010-10-27 ENCOUNTER — Emergency Department (HOSPITAL_COMMUNITY): Payer: Medicare Other

## 2010-10-27 ENCOUNTER — Encounter: Payer: Self-pay | Admitting: Internal Medicine

## 2010-10-27 ENCOUNTER — Inpatient Hospital Stay (HOSPITAL_COMMUNITY)
Admission: EM | Admit: 2010-10-27 | Discharge: 2010-10-29 | DRG: 641 | Disposition: A | Discharge status: Home / Self Care | Payer: Medicare Other | Source: Ambulatory Visit | Attending: Internal Medicine | Admitting: Internal Medicine

## 2010-10-27 DIAGNOSIS — R718 Other abnormality of red blood cells: Secondary | ICD-10-CM | POA: Diagnosis present

## 2010-10-27 DIAGNOSIS — Z87891 Personal history of nicotine dependence: Secondary | ICD-10-CM

## 2010-10-27 DIAGNOSIS — E119 Type 2 diabetes mellitus without complications: Secondary | ICD-10-CM | POA: Diagnosis present

## 2010-10-27 DIAGNOSIS — E860 Dehydration: Principal | ICD-10-CM | POA: Diagnosis present

## 2010-10-27 DIAGNOSIS — R42 Dizziness and giddiness: Secondary | ICD-10-CM | POA: Diagnosis present

## 2010-10-27 DIAGNOSIS — R252 Cramp and spasm: Secondary | ICD-10-CM | POA: Diagnosis present

## 2010-10-27 DIAGNOSIS — E785 Hyperlipidemia, unspecified: Secondary | ICD-10-CM | POA: Diagnosis present

## 2010-10-27 DIAGNOSIS — R0789 Other chest pain: Secondary | ICD-10-CM | POA: Diagnosis present

## 2010-10-27 DIAGNOSIS — I10 Essential (primary) hypertension: Secondary | ICD-10-CM | POA: Diagnosis present

## 2010-10-27 DIAGNOSIS — Z7982 Long term (current) use of aspirin: Secondary | ICD-10-CM

## 2010-10-27 DIAGNOSIS — N179 Acute kidney failure, unspecified: Secondary | ICD-10-CM | POA: Diagnosis present

## 2010-10-27 LAB — URINALYSIS, ROUTINE W REFLEX MICROSCOPIC
Glucose, UA: NEGATIVE mg/dL
Hgb urine dipstick: NEGATIVE
Specific Gravity, Urine: 1.025 (ref 1.005–1.030)
pH: 5 (ref 5.0–8.0)

## 2010-10-27 LAB — CBC
MCV: 67.3 fL — ABNORMAL LOW (ref 78.0–100.0)
Platelets: 374 10*3/uL (ref 150–400)
RBC: 5.96 MIL/uL — ABNORMAL HIGH (ref 4.22–5.81)
WBC: 9.4 10*3/uL (ref 4.0–10.5)

## 2010-10-27 LAB — DIFFERENTIAL
Basophils Relative: 0 % (ref 0–1)
Eosinophils Absolute: 0.1 10*3/uL (ref 0.0–0.7)
Eosinophils Relative: 1 % (ref 0–5)
Lymphocytes Relative: 22 % (ref 12–46)
Neutrophils Relative %: 63 % (ref 43–77)

## 2010-10-27 LAB — COMPREHENSIVE METABOLIC PANEL
ALT: 23 U/L (ref 0–53)
AST: 24 U/L (ref 0–37)
Alkaline Phosphatase: 118 U/L — ABNORMAL HIGH (ref 39–117)
CO2: 21 mEq/L (ref 19–32)
Chloride: 104 mEq/L (ref 96–112)
Creatinine, Ser: 1.7 mg/dL — ABNORMAL HIGH (ref 0.50–1.35)
GFR calc non Af Amer: 42 mL/min — ABNORMAL LOW (ref >60)
Potassium: 4.6 mEq/L (ref 3.5–5.1)
Sodium: 138 mEq/L (ref 135–145)
Total Bilirubin: 0.5 mg/dL (ref 0.3–1.2)

## 2010-10-27 LAB — GLUCOSE, CAPILLARY: Glucose-Capillary: 113 mg/dL — ABNORMAL HIGH (ref 70–99)

## 2010-10-27 LAB — URINE MICROSCOPIC-ADD ON

## 2010-10-27 NOTE — Progress Notes (Unsigned)
Hospital Admission Note Date: 10/27/2010  Patient name: Daniel Perkins Medical record number: [id number] Date of birth: 05-Jan-1956 Age: 55 y.o. Gender: male PCP: Lars Mage, MD  Medical Service: Internal Medicine Teaching Service B1  Attending physician: Dr. Margarito Liner Resident (R2/R3): Dr. Bethel Born  Pager: [phone] Resident (R1): Dr. Elyse Jarvis  Pager: [phone]  Chief Complaint: Chest pain, cramping  History of Present Illness: Daniel Perkins is a 55 year old man who presented to the [facility] ED complaining of cramping in his legs and hands for the last week.  He states that it is not correlated with activity and will sometimes happen at rest.  It can happen at anytime of the day.  He has tried massaging the area as well as heating pad and a warm bath soak which helps some.  The cramping can last anywhere from several minutes to almost an hour.  He denies any fever, chills, nausea, vomiting, diarrhea, constipation, changes in his urine, loss of control of his bladder or bowel, blood in his stool or blood in his urine.    He also over the last week has had some SOB with activity as well as some chest tightness.  He states that with minimal activity he has the onset of shortness of breath as well as a tightness in the center of his chest.  He states that if he stops and rests the tightness and SOB get better.  He is a former smoker, quit in 2009, with diabetes, HTN, and HLD.    Current Outpatient Prescriptions  Medication Sig Dispense Refill  . aspirin 81 MG chewable tablet Chew 1 tablet (81 mg total) by mouth daily.  30 tablet  11  . lisinopril-hydrochlorothiazide (PRINZIDE,ZESTORETIC) 20-25 MG per tablet Take 1 tablet by mouth daily.  30 tablet  11  . metFORMIN (GLUCOPHAGE) 500 MG tablet Take 1 tablet (500 mg total) by mouth 2 (two) times daily.  60 tablet  11  . pravastatin (PRAVACHOL) 40 MG tablet Take 40 mg by mouth daily.         Allergies: Morphine  Past Medical History    Diagnosis Date  . Coronary artery disease   . Gout   . Hyperlipidemia   . Hypertension   . Osteomyelitis   . CTEV (congenital talipes equinovarus)    Past Surgical History  Procedure Date  . Neck surgery   . Foot surgery    Family History  Problem Relation Age of Onset  . Diabetes Mother   . Coronary artery disease Mother     HAS PACEMAKER  . Heart disease Mother   . Heart attack Brother    History   Social History  . Marital Status: Married    Spouse Name: N/A    Number of Children: N/A  . Years of Education: N/A   Occupational History  . Not on file.   Social History Main Topics  . Smoking status: Former Smoker    Quit date: 07/06/2007  . Smokeless tobacco: Not on file  . Alcohol Use: Yes  . Drug Use: Not on file  . Sexually Active: Not on file   Other Topics Concern  . Not on file   Social History Narrative  . No narrative on file   Review of Systems: ROS negative except as noted in the HPI  Physical Exam: Vitals: Tm: 98.1 P: 83-109  BP: 127/77 Resp: 18  O2 Sat: 100% on 2L [place] Constitutional: Vital signs reviewed.  Patient is a well-developed and well-nourished  man in no acute distress and cooperative with exam. Alert and oriented x3.  Head: Normocephalic and atraumatic Ear: TM normal bilaterally Mouth: no erythema or exudates, MMM Eyes: PERRL, EOMI, conjunctivae normal, No scleral icterus.  Neck: Supple, Trachea midline normal ROM, No JVD, mass, thyromegaly, or carotid bruit present.  Cardiovascular: RRR, S1 normal, S2 normal, no MRG, pulses symmetric and intact bilaterally Pulmonary/Chest: CTAB, no wheezes, rales, or rhonchi Abdominal: Soft. Non-tender, non-distended, bowel sounds are normal, no masses, organomegaly, or guarding present.  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  No other joint deformities, erythema, or stiffness, ROM full and no nontender, no pain to palpation of the  large muscle groups.  Hematology: no cervical, inginal, or axillary adenopathy.  Neurological: A&O x3, Strength is normal and symmetric bilaterally, cranial nerve II-XII are grossly intact, no focal motor deficit, sensory intact to light touch bilaterally.  Skin: Warm, dry and intact. No rash, cyanosis, or clubbing.  Psychiatric: Normal mood and affect. speech and behavior is normal. Judgment and thought content normal. Cognition and memory are normal.   Lab results: Admission on 10/27/2010  Component Value Range  . Neutrophils Relative (%) 63  43-77  . Lymphocytes Relative (%) 22  12-46  . Monocytes Relative (%) 14* 3-12  . Eosinophils Relative (%) 1  0-5  . Basophils Relative (%) 0  0-1  . Neutro Abs (K/uL) 5.9  1.7-7.7  . Lymphs Abs (K/uL) 2.1  0.7-4.0  . Monocytes Absolute (K/uL) 1.3* 0.1-1.0  . Eosinophils Absolute (K/uL) 0.1  0.0-0.7  . Basophils Absolute (K/uL) 0.0  0.0-0.1  . Smear Review  LARGE PLATELETS PRESENT    . WBC (K/uL) 9.4  4.0-10.5  . RBC (MIL/uL) 5.96* 4.22-5.81  . Hemoglobin (g/dL) 13.0  86.5-78.4  . HCT (%) 40.1  39.0-52.0  . MCV (fL) 67.3* 78.0-100.0  . MCH (pg) 22.0* 26.0-34.0  . MCHC (g/dL) 69.6  29.5-28.4  . RDW (%) 15.2  11.5-15.5  . Platelets (K/uL) 374  150-400  . Sodium (mEq/L) 138  135-145  . Potassium (mEq/L) 4.6  3.5-5.1  . Chloride (mEq/L) 104  96-112  . CO2 (mEq/L) 21  19-32  . Glucose, Bld (mg/dL) 132* 44-01  . BUN (mg/dL) 30* 0-27  . Creatinine, Ser (mg/dL) 2.53* 6.64-4.03  . Calcium (mg/dL) 47.4  2.5-95.6  . Total Protein (g/dL) 7.9  3.8-7.5  . Albumin (g/dL) 3.9  6.4-3.3  . AST (U/L) 24  0-37  . ALT (U/L) 23  0-53  . Alkaline Phosphatase (U/L) 118* 39-117  . Total Bilirubin (mg/dL) 0.5  2.9-5.1  . GFR calc non Af Amer (mL/min) 42* >60  . GFR calc Af Amer (mL/min) 51* >60  . Total CK (U/L) 464* 7-232  . CK, MB (ng/mL) 3.7  0.3-4.0  . Relative Index  0.8  0.0-2.5  . Troponin I (ng/mL) <0.30  <0.30  . Color, Urine  AMBER* YELLOW  .  Appearance  CLOUDY* CLEAR  . Specific Gravity, Urine  1.025  1.005-1.030  . pH  5.0  5.0-8.0  . Glucose, UA (mg/dL) NEGATIVE  NEGATIVE  . Hgb urine dipstick  NEGATIVE  NEGATIVE  . Bilirubin Urine  SMALL* NEGATIVE  . Ketones (mg/dL) TRACE* NEGATIVE  . Protein (mg/dL) NEGATIVE  NEGATIVE  . Urobilinogen, UA (mg/dL) 1.0  8.8-4.1  . Nitrite  NEGATIVE  NEGATIVE  . Leukocytes, UA  TRACE* NEGATIVE  . Squamous Epithelial / LPF  FEW* RARE  . WBC,  UA (WBC/hpf) 0-2  <3  . Bacteria, UA  FEW* RARE  . Urine-Other  MUCOUS PRESENT     Imaging results:  Chest X-ray   Findings: The heart size and mediastinal contours are within normal limits.  Both lungs are clear.  The visualized skeletal structures are unremarkable.  2D echo on 08/11/10    - Left ventricle: There may be some diastolic dysfunction. There is upper septal thickening, but no SAM and no LVOT gradient. The cavity size was normal. Wall thickness was increased in a pattern of mild LVH. The estimated ejection fraction was 65%. Wall motion was normal; there were no regional wall motion abnormalities.   - Aortic valve: Mild regurgitation.  Cardiac cathterization on 08/17/09  ASSESSMENT:   1. Nonobstructive coronary disease with 20% to 30% obtuse marginal.   2. Normal left ventricular function.   3. Noncardiac chest pain, possibly related to gastroesophageal reflux       disease.   4. Dyslipidemia.  Myoview on 08/11/10 Overall Impression: There is no ischemia. There is normal wall motion. There is decreased activity at the immediate base of the inferior wall. This is fixed. This could be from the shape of the LV . The quantitative analysis does not recognize this finding.  Assessment & Plan by Problem: 1. Chest Pain: Patient presents with chest pain related to exertion, which has been ongoing for the past week, patient has not had similar symptoms in the past, has history of non obstructive CAD, with last catheterization done on 4/11 which shows  patent coronaries with only a trivial 20% OM stenosis, last 2-D echo on 08/2010 which shows 60-65% EF, mild AR. Pt had smililar presentation in the past and had a stress test in 08/2010 which was negative. EKG today showed: sinus tach, no signs of acute MI, no q waves detected. Differentials include Angina/ACS/MSK Pain/GERD/PUD/ Panic attack/anxiety less likely are PTX, PNA and Dissection given normal CXR. Given the patient's history and presentation, patient most likely is having typical angina with exertion. Plan: -Will admit to TELE -Cycle CE x3 if Positive, consider anticoagulation vs catheterisation. -12 lead EKG in AM. -Will start BBlocker, Full dose ASA, Statin, nitroglycerin, or percocet for pain. -Will consider cardiology consult if Troponins are positive.  2. Muscle cramping:  His cramping is not typical claudication and he has no signs of UMN lesions.  The diffuseness of the problem suggest something multisystemic.  Differential diagnosis includes electrolyte disturbances, medication effects (statin medications), dehydration, neuropathy (diabetic), anemia, and less likely vascular insuffiency with rest pain.  His potassium and calcium were normal in the ER.  He does have some mild elevation in his CK and alk phos which could indicate some mild rhabdomyolysis possibly secondary to Pravastatin.  He also has very microcytic red cells with barely normal hemoglobin. We will plan to hold his statin for now, check a magnesium, HIV, TSH, hydrate with NS, and check an anemia panel to check on his iron stores.    3. Acute on Chronic Renal Failure: This patient appears to have a baseline creatinine of 1-1.4 range based on prior hospital records. creatinine now is 1.70. Given his history of not eating or drinking for >1 days and elevated BUN, this is most likely pre-renal acute renal failure in the setting of dehydration. Will provide IV hydration and continue to monitor. UA and FENA. If renal function does  not recover, we'll consider a renal consult  4. DIABETES MELLITUS, TYPE II - HgbA1C 6.5 (06/2010). well controlled.  Will recheck A1c, place patient SSI and hold home metformin because of acute renal failure and possible need for further scans while in the hospital.  Continue ACE inhibitor.   5. HYPERTENSION - well controlled. Lisinopril-HCTZ at home. Given signs of dehydration, will hold HCTZ and contine Lisinopril for now.   6. HYPERLIPIDEMIA - Last FLP showed LDL of 172, Will recheck Lipid panel and liver function tests. We will hold his  Pravastatin because of the cramping for now.  If repeat LDL is higher than 70 consider changing to Crestor which has less side effects and is more potent (as having a target of <70 of LDL has been shown to reduce longterm CV complications from DM as shown in CARDS trial)  7. VTE: Lovenox 40 mg subcut daily      R2/3______________________________      R1________________________________  ATTENDING: I performed and/or observed a history and physical examination of the patient.  I discussed the case with the residents as noted and reviewed the residents' notes.  I agree with the findings and plan--please refer to the attending physician note for more details.  Signature________________________________  Printed Name_____________________________

## 2010-10-28 DIAGNOSIS — I209 Angina pectoris, unspecified: Secondary | ICD-10-CM

## 2010-10-28 LAB — CARDIAC PANEL(CRET KIN+CKTOT+MB+TROPI)
CK, MB: 4.2 ng/mL — ABNORMAL HIGH (ref 0.3–4.0)
Relative Index: 0.9 (ref 0.0–2.5)
Total CK: 395 U/L — ABNORMAL HIGH (ref 7–232)
Troponin I: <0.3 ng/mL (ref <0.30)

## 2010-10-28 LAB — BASIC METABOLIC PANEL
CO2: 22 mEq/L (ref 19–32)
Calcium: 9.1 mg/dL (ref 8.4–10.5)
Chloride: 101 mEq/L (ref 96–112)
Creatinine, Ser: 1.06 mg/dL (ref 0.50–1.35)
GFR calc Af Amer: >60 mL/min (ref >60)
Sodium: 136 mEq/L (ref 135–145)

## 2010-10-28 LAB — GLUCOSE, CAPILLARY
Glucose-Capillary: 122 mg/dL — ABNORMAL HIGH (ref 70–99)
Glucose-Capillary: 132 mg/dL — ABNORMAL HIGH (ref 70–99)
Glucose-Capillary: 110 mg/dL — ABNORMAL HIGH (ref 70–99)
Glucose-Capillary: 155 mg/dL — ABNORMAL HIGH (ref 70–99)

## 2010-10-28 LAB — MAGNESIUM: Magnesium: 2.4 mg/dL (ref 1.5–2.5)

## 2010-10-28 LAB — LIPID PANEL
HDL: 28 mg/dL — ABNORMAL LOW (ref >39)
LDL Cholesterol: 113 mg/dL — ABNORMAL HIGH (ref 0–99)
Triglycerides: 231 mg/dL — ABNORMAL HIGH (ref <150)
VLDL: 46 mg/dL — ABNORMAL HIGH (ref 0–40)

## 2010-10-28 LAB — VITAMIN B12: Vitamin B-12: 899 pg/mL (ref 211–911)

## 2010-10-28 LAB — HIV ANTIBODY (ROUTINE TESTING W REFLEX): HIV: NONREACTIVE

## 2010-10-28 LAB — HEMOGLOBIN A1C
Hgb A1c MFr Bld: 7.4 % — ABNORMAL HIGH (ref <5.7)
Mean Plasma Glucose: 166 mg/dL — ABNORMAL HIGH (ref <117)

## 2010-10-28 LAB — TECHNOLOGIST SMEAR REVIEW

## 2010-10-28 LAB — FOLATE: Folate: 10 ng/mL

## 2010-10-28 LAB — IRON AND TIBC: Saturation Ratios: 18 % — ABNORMAL LOW (ref 20–55)

## 2010-10-29 LAB — BASIC METABOLIC PANEL
BUN: 20 mg/dL (ref 6–23)
Calcium: 9.2 mg/dL (ref 8.4–10.5)
Creatinine, Ser: 0.99 mg/dL (ref 0.50–1.35)
GFR calc Af Amer: >60 mL/min (ref >60)

## 2010-10-29 LAB — CBC
HCT: 38.5 % — ABNORMAL LOW (ref 39.0–52.0)
MCH: 22.3 pg — ABNORMAL LOW (ref 26.0–34.0)
MCV: 67.5 fL — ABNORMAL LOW (ref 78.0–100.0)
Platelets: 313 10*3/uL (ref 150–400)
RDW: 15.2 % (ref 11.5–15.5)

## 2010-10-29 LAB — GLUCOSE, CAPILLARY: Glucose-Capillary: 133 mg/dL — ABNORMAL HIGH (ref 70–99)

## 2010-11-01 LAB — HEMOGLOBINOPATHY EVALUATION
Hgb A: 97.8 % (ref 96.8–97.8)
Hgb F Quant: 0 % (ref 0.0–2.0)
Hgb S Quant: 0 % (ref 0.0–0.0)

## 2010-11-02 ENCOUNTER — Encounter: Payer: Self-pay | Admitting: Internal Medicine

## 2010-11-02 NOTE — Progress Notes (Deleted)
  Subjective:    Patient ID: Daniel Perkins, male    DOB: 07-12-1955, 55 y.o.   MRN: [id number]  HPI    Review of Systems     Objective:   Physical Exam        Assessment & Plan:

## 2010-11-02 NOTE — Progress Notes (Signed)
DOA: 10/27/10 DOD: 10/29/10  Discharge summary; Patient presented with muscle cramps. All the possible reversible causes for his cramps including electrolytes( Mg, K) were WNL. His TSH was alos checked and found WNL. Other possible cause was likely statin- his CK-T was mildly elevated during hospitalization. At the time of his discharge his statin was held. He was advised to continue holding statin for 1 month. We may consider restarting him back in 1 month on the same statin( pravastatin) or different statin. He was also complaining of chronic dyspnea on exertion with some chest discomfort that was getting worse for 1 week.  This could be related to COPD- Given his smoking history he will need a referral for PFT's to establish his baseline. The cardiac etiology was less likely in the setting of negative EKG, CEx3 negative. Patient has nonobstructive CAD per cath on 4/11, last echo showed EF- 60-65%,mild AR, stress test on 4/12 that was negative.  His A1C was increased from 6.5 to 7.4- so his dose for metformin was increased from 500 to 1000 mg in AM and evening dose was kept the same.   Labs to follow: CK, T  Labs pending at discharge; Hemoglobin electrophoresis for microcytosis to rule out Thalesemmia  Problem List : Reviewed.  Medication; Reviewed.

## 2010-11-03 ENCOUNTER — Telehealth: Payer: Self-pay | Admitting: Dietician

## 2010-11-03 ENCOUNTER — Encounter: Payer: Self-pay | Admitting: Dietician

## 2010-11-03 NOTE — Telephone Encounter (Signed)
Patient's  number temporarily disconnected. He does have a follow up  appointment scheduled on 7/9 @ 3:45 PM. Will mail letter confirming appointment.

## 2010-11-04 ENCOUNTER — Emergency Department (HOSPITAL_COMMUNITY)
Admission: EM | Admit: 2010-11-04 | Discharge: 2010-11-04 | Disposition: A | Discharge status: Home / Self Care | Payer: Medicare Other | Attending: Emergency Medicine | Admitting: Emergency Medicine

## 2010-11-04 DIAGNOSIS — R252 Cramp and spasm: Secondary | ICD-10-CM | POA: Insufficient documentation

## 2010-11-04 DIAGNOSIS — I252 Old myocardial infarction: Secondary | ICD-10-CM | POA: Insufficient documentation

## 2010-11-04 DIAGNOSIS — IMO0001 Reserved for inherently not codable concepts without codable children: Secondary | ICD-10-CM | POA: Insufficient documentation

## 2010-11-04 DIAGNOSIS — E785 Hyperlipidemia, unspecified: Secondary | ICD-10-CM | POA: Insufficient documentation

## 2010-11-04 DIAGNOSIS — E119 Type 2 diabetes mellitus without complications: Secondary | ICD-10-CM | POA: Insufficient documentation

## 2010-11-04 DIAGNOSIS — N289 Disorder of kidney and ureter, unspecified: Secondary | ICD-10-CM | POA: Insufficient documentation

## 2010-11-04 DIAGNOSIS — I10 Essential (primary) hypertension: Secondary | ICD-10-CM | POA: Insufficient documentation

## 2010-11-04 DIAGNOSIS — M109 Gout, unspecified: Secondary | ICD-10-CM | POA: Insufficient documentation

## 2010-11-04 LAB — CBC
MCH: 21.8 pg — ABNORMAL LOW (ref 26.0–34.0)
MCV: 67.4 fL — ABNORMAL LOW (ref 78.0–100.0)
Platelets: 309 10*3/uL (ref 150–400)
RBC: 5.79 MIL/uL (ref 4.22–5.81)
RDW: 14.8 % (ref 11.5–15.5)

## 2010-11-04 LAB — DIFFERENTIAL
Basophils Absolute: 0 10*3/uL (ref 0.0–0.1)
Basophils Relative: 0 % (ref 0–1)
Eosinophils Absolute: 0.1 10*3/uL (ref 0.0–0.7)
Lymphocytes Relative: 32 % (ref 12–46)
Monocytes Absolute: 0.7 10*3/uL (ref 0.1–1.0)
Neutro Abs: 4.6 10*3/uL (ref 1.7–7.7)
Neutrophils Relative %: 58 % (ref 43–77)

## 2010-11-04 LAB — URINALYSIS, ROUTINE W REFLEX MICROSCOPIC
Bilirubin Urine: NEGATIVE
Ketones, ur: NEGATIVE mg/dL
Leukocytes, UA: NEGATIVE
Nitrite: NEGATIVE
Protein, ur: NEGATIVE mg/dL
Urobilinogen, UA: 0.2 mg/dL (ref 0.0–1.0)
pH: 5 (ref 5.0–8.0)

## 2010-11-04 LAB — CK: Total CK: 397 U/L — ABNORMAL HIGH (ref 7–232)

## 2010-11-04 LAB — RAPID URINE DRUG SCREEN, HOSP PERFORMED
Barbiturates: NOT DETECTED
Benzodiazepines: NOT DETECTED

## 2010-11-04 LAB — BASIC METABOLIC PANEL
CO2: 21 mEq/L (ref 19–32)
Calcium: 10.7 mg/dL — ABNORMAL HIGH (ref 8.4–10.5)
Creatinine, Ser: 2 mg/dL — ABNORMAL HIGH (ref 0.50–1.35)
GFR calc Af Amer: 42 mL/min — ABNORMAL LOW (ref >60)
GFR calc non Af Amer: 35 mL/min — ABNORMAL LOW (ref >60)
Sodium: 134 mEq/L — ABNORMAL LOW (ref 135–145)

## 2010-11-12 ENCOUNTER — Ambulatory Visit (INDEPENDENT_AMBULATORY_CARE_PROVIDER_SITE_OTHER): Payer: Medicare Other | Admitting: Internal Medicine

## 2010-11-12 VITALS — BP 100/67 | HR 95 | Temp 97.2°F | Wt 219.2 lb

## 2010-11-12 DIAGNOSIS — N189 Chronic kidney disease, unspecified: Secondary | ICD-10-CM

## 2010-11-12 DIAGNOSIS — R0602 Shortness of breath: Secondary | ICD-10-CM

## 2010-11-12 DIAGNOSIS — M6282 Rhabdomyolysis: Secondary | ICD-10-CM

## 2010-11-12 DIAGNOSIS — I10 Essential (primary) hypertension: Secondary | ICD-10-CM

## 2010-11-12 DIAGNOSIS — N179 Acute kidney failure, unspecified: Secondary | ICD-10-CM

## 2010-11-12 DIAGNOSIS — E119 Type 2 diabetes mellitus without complications: Secondary | ICD-10-CM

## 2010-11-12 LAB — CK: Total CK: 225 U/L (ref 7–232)

## 2010-11-12 NOTE — Progress Notes (Signed)
55 yo man who was recently hospitalized for severe muscle cramps with CK total 464 and SOB with a negative work-up presents today for  follow up.  It was thought that his muscle cramp was secondary to statin use and he was told to stop Pravastatin.  He reports having another episode of muscle cramp after hospital discharge in which he went to the ED and was given IV fluid and then sent home.  He also has SOB for the past several months that is worse on exertion.  He becomes SOB when walk about 10-15 feet, especially in the hot weather.  The SOB lasts about 30-40 seconds then resolved.  He had a PFT done a few months ago but we are not able to obtain results because the office is closed.  He is sleeping on 2 pillows at night because of his previous neck surgery.  He denies any chestpain, fever, or chills today and states that he has not had any more muscle cramps since 6/28.  Patient reports taking over the counter Potassium once daily and been eating a lot of banana because he thought that he probably has low potassium which could have contribute to his cramping.  Patient does have a history of 20+ years ppd smoking in which he quit about 3 years ago.  He was found to have elevated Cr on 6/28 but he continued to take his Metformin.  ROS: As per HPI  PE: General: alert, well-developed, and cooperative to examination.   Lungs: normal respiratory effort, no accessory muscle use, normal breath sounds, no crackles, and no wheezes. Heart: normal rate, regular rhythm, no murmur, no gallop, and no rub.  Abdomen: soft, non-tender, normal bowel sounds, no distention, no guarding, no rebound tenderness Msk: no joint swelling, no joint warmth, and no redness over joints.  Pulses: 2+ DP/PT pulses bilaterally Extremities: No cyanosis, clubbing, edema Neurologic: alert & oriented X3, cranial nerves II-XII intact, strength normal in all extremities, sensation intact to light touch, and gait normal.  Psych: Oriented  X3, memory intact for recent and remote, normally interactive, good eye contact, not anxious appearing, and not depressed appearing.

## 2010-11-12 NOTE — Assessment & Plan Note (Addendum)
Well-controlled. Will stop lisinopril-hctz in setting of elevated Cr of 2.0.   Will reassess once his acute on chronic renal failure is resolved.

## 2010-11-12 NOTE — Assessment & Plan Note (Signed)
Adequately controlled.  HbA1c on 10/28/10 was 7.4.  Given his acute renal failure with a Cr of 2.00 on 11/04/10, will stop metformin for now.  He states that he has been eating a lot of vegetables.   -Advised healthy diet for control of his sugar at this time

## 2010-11-12 NOTE — Assessment & Plan Note (Signed)
Improving muscle cramps.  CK total during hospital admission was 464-->397. Will recheck CK total again today. -Encourage hydration

## 2010-11-12 NOTE — Patient Instructions (Addendum)
Please stop taking Metformin for now because your kidney function is elevated Continue to stop Pravastatin Will get labs today and I will call you with any abnormal lab results Continue drinking plenty of water to keep yourself hydrated Stop Lisinopril-HCTZ for now Follow up with Dr. Anselm Jungling in 2-3 weeks

## 2010-11-12 NOTE — Assessment & Plan Note (Signed)
Likely pre-renal as well as from rhabdomyolysis.   -Will recheck BMP -Continue hydration

## 2010-11-12 NOTE — Assessment & Plan Note (Addendum)
Unclear etiology at this time.  This could be cardiac in origin or pulmonary given his extensive smoking history.  PE is unlikely but will need to be ruled out with CT-A.   -Will check BMP today and if his Cr is back at baseline (1), then will order CT-A, if not we'll do VQ scan -Will obtain PFTs results to see if patient has COPD/asthma -Will refer patient back to follow up with his cardiologist-Dr. Clifton James if he continues to have DOE  Case discussed with Dr. Phillips Odor

## 2010-11-13 LAB — BASIC METABOLIC PANEL WITH GFR
CO2: 22 mEq/L (ref 19–32)
Calcium: 9.5 mg/dL (ref 8.4–10.5)
Glucose, Bld: 105 mg/dL — ABNORMAL HIGH (ref 70–99)
Potassium: 5.2 mEq/L (ref 3.5–5.3)
Sodium: 137 mEq/L (ref 135–145)

## 2010-11-15 ENCOUNTER — Encounter: Payer: Medicare Other | Admitting: Internal Medicine

## 2010-11-16 ENCOUNTER — Ambulatory Visit (INDEPENDENT_AMBULATORY_CARE_PROVIDER_SITE_OTHER): Payer: Medicare Other | Admitting: Dietician

## 2010-11-16 DIAGNOSIS — E119 Type 2 diabetes mellitus without complications: Secondary | ICD-10-CM

## 2010-11-16 DIAGNOSIS — I10 Essential (primary) hypertension: Secondary | ICD-10-CM

## 2010-11-16 DIAGNOSIS — E785 Hyperlipidemia, unspecified: Secondary | ICD-10-CM

## 2010-11-17 NOTE — Progress Notes (Signed)
Patient attended a 1 hour group session today on making healthy lifestyle changes. The discussion included learning how to avoid heat related problems when exercising, healthier food choices, weight loss/preparing foods with less fat and general healthy nutrition.  

## 2010-11-26 ENCOUNTER — Ambulatory Visit: Payer: Medicare Other | Admitting: Internal Medicine

## 2010-11-29 ENCOUNTER — Ambulatory Visit: Payer: Medicare Other | Admitting: Internal Medicine

## 2010-11-29 ENCOUNTER — Ambulatory Visit: Payer: Medicare Other | Admitting: Dietician

## 2010-12-21 ENCOUNTER — Ambulatory Visit: Payer: Medicare Other | Admitting: Dietician

## 2010-12-21 NOTE — Discharge Summary (Signed)
NAME:  Daniel Perkins, Daniel Perkins NO.:  [id number]  MEDICAL RECORD NO.:  [id number]  LOCATION:                                 FACILITY:  PHYSICIAN:  Ileana Roup, M.D.  DATE OF BIRTH:  1955/09/22  DATE OF ADMISSION:10/27/10 DATE OF DISCHARGE:10/29/10                              DISCHARGE SUMMARY   DISCHARGE DIAGNOSES: 1. Muscle cramps likely from volume depletion. 2. Acute renal failure likely secondary to volume depletion, resolved. 3. Dizziness likely vasovagal or orthostatic in the setting of     volume depletion. 4. Chronic chest discomfort with exertional dyspnea. 5. Microcytosis. 6. Type 2 diabetes mellitus with A1c of 7.4 on 10/28/10 7. Hypertension. 8. Hyperlipidemia.  DISCHARGE MEDICATIONS WITH ACCURATE DOSES: 1. Aspirin 81 mg to take 1 tablet by mouth daily. 2. Lisinopril/hydrochlorothiazide 20/25 mg to take 1 tablet by mouth     daily. 3. Metformin 500 mg to take 2 tablets by mouth in the morning and one     in the evening. 4. Vicodin 7.5/325 mg to take 1-2 tablets by mouth every 6 h. as     needed.  DISPOSITION AND FOLLOWUP:  The patient was discharged home in stable and improved condition from [facility] on October 29, 2010.  The patient has been scheduled a followup appointment with Dr. Anselm Jungling on November 15, 2010, at 2:45 p.m.  At that time, his CK levels needs to be rechecked as it was slightly elevated during the hospital admission.  This was thought likely secondary to pravastatin that he was taking and the patient was advised to hold pravastatin for 1 month in the setting of muscle cramps  and elevated CK.Therefore, we need to follow up on his CK total to make sure  it has resolved back to normal values. With his smoking history, and complain  of chronic chest pain with dyspnea on exertion, we need to make a referral for PFT's to establish his baseline.  PROCEDURES PERFORMED: 1. Chest x-ray on October 27, 2010, which showed no acute  cardiopulmonary     abnormality. 2. Ambulatory O2 sats which were 97% on room air.  CONSULTATIONS:  None.  BRIEF ADMITTING HISTORY AND PHYSICAL:  Daniel Perkins is a 55 year old man who presented to [facility] complaining of cramping in his legs and hands for the last week.  He states this was not correlated with activity and it did happen sometimes even at rest.  It can happen anytime of the day.  He has tried massaging the area as well as heating pad and a warm bath soap.  The cramping can last anywhere from several minutes to almost an hour.  He denies any fever, chills, nausea, vomiting, diarrhea, constipation, changes in urine or loss of bladder or bowel complaints.  He also reports that over the last week he has been having some shortness of breath with activity as well as with some chest tightness.  He states that with minimal activity he has onset of shortness of breath as well as tightness in the center of his chest.  He states that if he stops and rest, his shortness of breath and tightness get better.  He is a former smoker and  quit smoking in 2009 with diabetes, hypertension and hyperlipidemia.  PHYSICAL EXAMINATION:  VITAL SIGNS:  His vitals on admission; temperature 98.1, pulse 83, 209, blood pressure 127/77, respiratory rate 18, O2 sats 100% on 2 L nasal cannula. GENERAL:  The patient is well-developed and well-nourished, in no acute distress. HEENT:  Normocephalic, atraumatic.  Pupils equal, round and reactive to light. MOUTH:  No erythema or exudates, dry mucous membranes. NECK:  Supple.  No lymphadenopathy.  No JVD.  No carotid bruits present. CARDIOVASCULAR:  Regular rate and rhythm.  S1 and S2 normal.  No murmurs, rubs or gallops. CHEST:  Clear to auscultation bilaterally.  No wheezes, rhonchi or rales. ABDOMEN:  Soft, nontender, nondistended, bowel sounds present, no organomegaly. MUSCULOSKELETAL:  Bilateral foot scars from multiple surgeries  to correct left foot.  No other joint deformities. NEUROLOGIC:  Alert, oriented x3, strength normal and symmetric bilaterally, cranial nerves II through XII grossly intact, sensation is intact to light touch bilaterally.  LABORATORY DATA ON ADMISSION:  White cell count 9.4, hemoglobin 13.1, hematocrit 40.1, platelets 374.  Sodium 138, potassium 4.6, chloride 104, bicarb 21, glucose 127, BUN 30, creatinine 1.7 and calcium 10.3.  HOSPITAL COURSE BY PROBLEM: 1. Muscle cramps.  The patient complained of worsening of his chronic     muscle cramps over this period of 1 week.  This could be likely     related to volume depletion as the patient had significant elevation in     his creatinine at the time of presentation.  Other possible     reversible causes of muscle cramps like electrolyte abnormality     including potassium, magnesium and phosphorus were checked and were     found to be within normal limits.  His TSH was checked and was also     found within normal limits. It was thought that these muscle cramps      could be related to statin and therefore he was advised to hold      pravastatin for 1 month. 2. Acute renal failure.  The patient was noticed having significant     increase in his creatinine at the time of admission from baseline of     1.1 to 1.7, that was thought to be prerenal in etiology from volume depletion.  The     patient responded very well to IV fluids and his creatinine returned     back to his baseline with the next blood draw. 3. Chronic chest pain with exertional dyspnea.  The patient complained     of burning or rushing pain in his chest associated with some     exertional dyspnea that has been going on for almost a year now and     was bothering him more for the last week.  The patient has been     evaluated for similar complaints in the past in March 2011, when he     had a cardiac cath and it showed a nonobstructive coronary artery     disease.  The patient  also had a stress test in April 2012, which     was negative.  During this admission as well, cardiac enzymes were     cycled x3 and they were consistently negative.  The patient's EKG     did not show any acute ischemic changes.  It seems the patient was     advised to schedule a followup appointment with his cardiologist.     Given his smoking history,  we also need to make a referral to get      baseline PFTs with his outpatient appointment. 4. Microcytosis.  The patient had MCV of 67.3 at the time of     admission.  Differentials included iron-deficiency anemia versus     thalassemia trait.  The patient had anemia panel which showed a     ferritin of 354 subsequent to which hemoglobin electrophoresis was     ordered.  It was pending at the time of discharge. 5. Diabetes mellitus.  The patient's blood sugars were very well     controlled throughout his hospital stay.  His A1c increased from     6.5 back in February 2012, to 7.4.  So his dose of metformin was     increased from 500 mg to 1000 mg in the morning and the evening     dose was kept the same.  The patient was advised to continue on the     ACE inhibitor. 6. Hypertension.  His blood pressures were very well controlled     throughout the hospital stay.  He was discharged on home medication     of lisinopril/hydrochlorothiazide. 7. Hyperlipidemia.  In the setting of muscle cramps and slightly     elevated CK total, the patient was advised to hold pravastatin for     1 month.  After 1 month, the patient can be restarted back on     pravastatin or he can be changed to different statin in the setting     of these muscle cramps and myalgias.    ______________________________ Elyse Jarvis, MD   ______________________________ Ileana Roup, M.D.    MS/MEDQ  D:  10/31/2010  T:  11/01/2010  Job:  [id number]  Electronically Signed by Elyse Jarvis MD on 11/21/2010 02:53:27 PM Electronically Signed by Margarito Liner M.D. on  12/21/2010 05:59:42 PM

## 2011-01-26 LAB — CBC
HCT: 33.2 — ABNORMAL LOW
Hemoglobin: 10.4 — ABNORMAL LOW
MCHC: 31.3
Platelets: 326
RDW: 16.7 — ABNORMAL HIGH

## 2011-01-26 LAB — COMPREHENSIVE METABOLIC PANEL
ALT: 16
AST: 21
Alkaline Phosphatase: 154 — ABNORMAL HIGH
CO2: 28
Calcium: 9.5
Chloride: 102
GFR calc non Af Amer: >60
Glucose, Bld: 132 — ABNORMAL HIGH
Sodium: 136
Total Bilirubin: 0.5

## 2011-01-31 LAB — CBC
Platelets: 292
RBC: 4.67
WBC: 7.2

## 2011-01-31 LAB — WOUND CULTURE

## 2011-01-31 LAB — BASIC METABOLIC PANEL
BUN: 18
Calcium: 9
Chloride: 106
Creatinine, Ser: 1.24
GFR calc Af Amer: >60
GFR calc non Af Amer: >60

## 2011-02-01 LAB — CBC
Hemoglobin: 10.8 — ABNORMAL LOW
MCHC: 31.5
Platelets: 341
RDW: 18.8 — ABNORMAL HIGH

## 2011-02-01 LAB — BASIC METABOLIC PANEL
BUN: 18
CO2: 26
Calcium: 10
GFR calc non Af Amer: >60
Glucose, Bld: 127 — ABNORMAL HIGH
Sodium: 138

## 2011-02-03 LAB — BASIC METABOLIC PANEL
BUN: 26 — ABNORMAL HIGH
Calcium: 9.5
GFR calc non Af Amer: >60
Glucose, Bld: 105 — ABNORMAL HIGH
Potassium: 4.4
Sodium: 138

## 2011-02-03 LAB — POCT I-STAT, CHEM 8
BUN: 23
Chloride: 103
Creatinine, Ser: 1.2
Glucose, Bld: 108 — ABNORMAL HIGH
Hemoglobin: 12.9 — ABNORMAL LOW
Potassium: 3.9
Sodium: 137

## 2011-02-03 LAB — CBC
HCT: 36.1 — ABNORMAL LOW
Hemoglobin: 10.6 — ABNORMAL LOW
Hemoglobin: 11.1 — ABNORMAL LOW
Platelets: 354
RBC: 5.57
RDW: 16.5 — ABNORMAL HIGH
WBC: 6.3

## 2011-02-03 LAB — DIFFERENTIAL
Basophils Relative: 0
Eosinophils Relative: 1
Lymphocytes Relative: 19
Monocytes Relative: 16 — ABNORMAL HIGH
Neutro Abs: 7.8 — ABNORMAL HIGH
Neutrophils Relative %: 64

## 2011-02-08 ENCOUNTER — Encounter: Payer: Medicare Other | Admitting: Internal Medicine

## 2011-02-15 LAB — BASIC METABOLIC PANEL
CO2: 30
Chloride: 103
GFR calc non Af Amer: >60
Glucose, Bld: 116 — ABNORMAL HIGH
Potassium: 4.1
Sodium: 138

## 2011-02-15 LAB — CBC
HCT: 32.3 — ABNORMAL LOW
Hemoglobin: 10 — ABNORMAL LOW
MCV: 66 — ABNORMAL LOW
RDW: 16.2 — ABNORMAL HIGH

## 2011-02-16 LAB — ANAEROBIC CULTURE

## 2011-02-16 LAB — WOUND CULTURE

## 2011-02-17 LAB — CBC
MCHC: 31.4
MCV: 65.4 — ABNORMAL LOW
Platelets: 296
WBC: 6.1

## 2011-02-17 LAB — BASIC METABOLIC PANEL
BUN: 23
CO2: 26
Chloride: 105
Creatinine, Ser: 1.34

## 2011-02-18 LAB — ANAEROBIC CULTURE: Gram Stain: NONE SEEN

## 2011-02-18 LAB — CBC
Platelets: 354
RDW: 16.7 — ABNORMAL HIGH

## 2011-02-18 LAB — COMPREHENSIVE METABOLIC PANEL
ALT: 13
Albumin: 3.4 — ABNORMAL LOW
Alkaline Phosphatase: 122 — ABNORMAL HIGH
BUN: 14
Potassium: 4.3
Sodium: 139
Total Protein: 6.6

## 2011-02-18 LAB — VANCOMYCIN, TROUGH: Vancomycin Tr: 12.9

## 2011-02-18 LAB — CULTURE, ROUTINE-ABSCESS

## 2011-02-22 LAB — BASIC METABOLIC PANEL
CO2: 25
Calcium: 9.7
Glucose, Bld: 112 — ABNORMAL HIGH
Sodium: 138

## 2011-02-22 LAB — CBC
Hemoglobin: 11 — ABNORMAL LOW
MCHC: 31.5
MCV: 67.3 — ABNORMAL LOW
RDW: 17.2 — ABNORMAL HIGH

## 2011-03-23 ENCOUNTER — Ambulatory Visit (INDEPENDENT_AMBULATORY_CARE_PROVIDER_SITE_OTHER): Payer: Medicare Other | Admitting: Internal Medicine

## 2011-03-23 VITALS — BP 154/100 | HR 78 | Wt 227.0 lb

## 2011-03-23 DIAGNOSIS — E119 Type 2 diabetes mellitus without complications: Secondary | ICD-10-CM

## 2011-03-23 DIAGNOSIS — I10 Essential (primary) hypertension: Secondary | ICD-10-CM

## 2011-03-23 DIAGNOSIS — R079 Chest pain, unspecified: Secondary | ICD-10-CM

## 2011-03-23 DIAGNOSIS — Z23 Encounter for immunization: Secondary | ICD-10-CM

## 2011-03-23 DIAGNOSIS — E785 Hyperlipidemia, unspecified: Secondary | ICD-10-CM

## 2011-03-23 LAB — BASIC METABOLIC PANEL WITH GFR
Calcium: 9.9 mg/dL (ref 8.4–10.5)
GFR, Est African American: >89 mL/min/{1.73_m2}
GFR, Est Non African American: 86 mL/min/{1.73_m2}
Glucose, Bld: 98 mg/dL (ref 70–99)
Potassium: 4.8 mEq/L (ref 3.5–5.3)
Sodium: 137 mEq/L (ref 135–145)

## 2011-03-23 LAB — GLUCOSE, CAPILLARY: Glucose-Capillary: 114 mg/dL — ABNORMAL HIGH (ref 70–99)

## 2011-03-23 LAB — POCT GLYCOSYLATED HEMOGLOBIN (HGB A1C): Hemoglobin A1C: 6.6

## 2011-03-23 MED ORDER — PRAVASTATIN SODIUM 20 MG PO TABS: 20.0000 mg | ORAL_TABLET | Freq: Every day | ORAL | Status: DC

## 2011-03-23 MED ORDER — METFORMIN HCL 500 MG PO TABS: 500.0000 mg | ORAL_TABLET | ORAL | Status: DC

## 2011-03-23 MED ORDER — LISINOPRIL-HYDROCHLOROTHIAZIDE 10-12.5 MG PO TABS: 1.0000 | ORAL_TABLET | Freq: Every day | ORAL | Status: DC

## 2011-03-23 NOTE — Patient Instructions (Signed)
1800 Calorie Diabetic Diet The 1800 calorie diabetic diet is designed for eating up to 1800 calories each day. Following this diet and making healthy meal choices can help improve overall health. It controls blood glucose (sugar) levels, and it can also help lower blood pressure and cholesterol. SERVING SIZES Measuring foods and serving sizes helps to make sure you are getting the right amount of food. The list below tells how big or small some common serving sizes are:  1 oz.........4 stacked dice.   3 oz.........Deck of cards.   1 tsp........Tip of little finger.   1 tbs........Thumb.   2 tbs........Golf ball.    cup.......Half of a fist.   1 cup........A fist.  GUIDELINES FOR CHOOSING FOODS The goal of this diet is to eat a variety of foods and limit calories to 1800 each day. This can be done by choosing foods that are low in calories and fat. The diet also suggests eating small amounts of food frequently. Doing this helps control your blood glucose levels so they do not get too high or too low. Each meal or snack may include a protein food source to help you feel more satisfied. Try to eat about the same amount of food around the same time each day. This includes weekend days, travel days, and days off work. Space your meals about 4 to 5 hours apart, and add a snack between them, if you wish.  For example, a daily food plan could include breakfast, a morning snack, lunch, dinner, and an evening snack. Healthy meals and snacks have different types of foods, including whole grains, vegetables, fruits, lean meats, poultry, fish, and dairy products. As you plan your meals, select a variety of foods. Choose from the bread and starch, vegetable, fruit, dairy, and meat/protein groups. Examples of foods from each group are listed below with their suggested serving sizes. Use measuring cups and spoons to become familiar with what a healthy portion looks like. Bread and Starch Each serving equals  15 grams of carbohydrates.  1 slice bread.    bagel.    cup cold cereal (unsweetened).    cup hot cereal or mashed potatoes.   1 small potato (size of a computer mouse).   ? cup cooked pasta or rice.    English muffin.   1 cup broth-based soup.   3 cups of popcorn.   4 to 6 whole-wheat crackers.    cup cooked beans, peas, or corn.  Vegetables Each serving equals 5 grams of carbohydrates.   cup cooked vegetables.   1 cup raw vegetables.    cup tomato or vegetable juice.  Fruit Each serving equals 15 grams of carbohydrates.  1 small apple or orange.   1  cup watermelon or strawberries.    cup applesauce (no sugar added).   2 tbs raisins.    banana.    cup canned fruit, packed in water or in its own juice.    cup unsweetened fruit juice.  Dairy Each serving equals 12 to 15 grams of carbohydrates.  1 cup fat-free milk.   6 oz artificially sweetened yogurt or plain yogurt.   1 cup low-fat buttermilk.   1 cup soy milk.   1 cup almond milk.  Meat/Protein  1 large egg.   2 to 3 oz meat, poultry, or fish.    cup low-fat cottage cheese.   1 tbs peanut butter.   1 oz low-fat cheese.    cup tuna, packed in water.      cup tofu.  Fat  1 tsp oil.   1 tsp trans-fat-free margarine.   1 tsp butter.   1 tsp mayonnaise.   2 tbs avocado.   1 tbs salad dressing.   1 tbs cream cheese.   2 tbs sour cream.  SAMPLE 1800 CALORIE DIET PLAN Breakfast   cup unsweetened cereal (1 carb serving).   1 cup fat-free milk (1 carb serving).   1 slice whole-wheat toast (1 carb serving).    small banana (1 carb serving).   1 scrambled egg.   1 tsp trans-fat-free margarine.  Lunch  Tuna sandwich.   2 slices whole-wheat bread (2 carb servings).    cup canned tuna in water, drained.   1 tbs reduced fat mayonnaise.   1 stalk celery, chopped.   2 slices tomato.   1 lettuce leaf.   1 cup carrot sticks.   24 to 30 seedless  grapes (2 carb servings).   6 oz light yogurt (1 carb serving).  Afternoon Snack  3 graham cracker squares (1 carb serving).   1 cup fat-free milk (1 carb serving).   1 tbs peanut butter.  Dinner  3 oz salmon, broiled with 1 tsp oil.   1 cup mashed potatoes (2 carb servings) with 1 tsp trans-fat-free margarine.   1 cup fresh or frozen green beans.   1 cup steamed asparagus.   1 cup fat-free milk (1 carb serving).  Evening Snack  3 cups of air-popped popcorn (1 carb serving).   2 tbs Parmesan cheese.  Meal Plan You can use this worksheet to help you make a daily meal plan based on the 1800 calorie diabetic diet suggestions. If you are using this plan to help you control your blood glucose, you may interchange carbohydrate-containing foods (dairy, starches, and fruits). Select a variety of fresh foods of varying colors and flavors. The total amount of carbohydrate in your meals or snacks is more important than making sure you include all of the food groups every time you eat. Choose from the approximate amount of the following foods to build your day's meals:  8 Starches.   4 Vegetables.   3 Fruits.   2 Dairy.   6 to 7 oz Meat/Protein.   Up to 4 Fats.  Your dietician can use this worksheet to help you decide how many servings and which types of foods are right for you. BREAKFAST Food Group and Servings / Food Choice Starches _______________________________________________________ Dairy __________________________________________________________ Fruit ___________________________________________________________ Meat/Protein ____________________________________________________ Fat ____________________________________________________________ LUNCH Food Group and Servings / Food Choice Starch _________________________________________________________ Meat/Protein ___________________________________________________ Vegetables  _____________________________________________________ Fruit __________________________________________________________ Dairy __________________________________________________________ Fat ____________________________________________________________ AFTERNOON SNACK Food Group and Servings / Food Choice Starch ________________________________________________________ Meat/Protein ___________________________________________________ Fruit __________________________________________________________ Dairy __________________________________________________________ DINNER Food Group and Servings / Food Choice Starches _______________________________________________________ Meat/Protein ___________________________________________________ Dairy __________________________________________________________ Vegetable ______________________________________________________ Fruit ___________________________________________________________ Fat ____________________________________________________________ EVENING SNACK Food Group and Servings / Food Choice Fruit __________________________________________________________ Meat/Protein ___________________________________________________ Dairy __________________________________________________________ Starch _________________________________________________________ DAILY TOTALS Starches _________________________ Vegetables _______________________ Fruits ____________________________ Dairy ____________________________ Meat/Protein_____________________ Fats _____________________________ Document Released: 11/15/2004 Document Revised: 01/05/2011 Document Reviewed: 03/12/2009 [employer] Patient Information 2012 [employer]. 

## 2011-03-24 NOTE — Assessment & Plan Note (Signed)
I will start the patient on Pravachol 20 mg a day. Recheck lipid profile and metabolic profile at followup visit.

## 2011-03-24 NOTE — Progress Notes (Signed)
  Subjective:    Patient ID: Daniel Perkins, male    DOB: 02-24-1956, 55 y.o.   MRN: [id number]  HPI  Daniel Perkins is a 55--old man with past medical history most significant for diabetes hypertension and hyperlipidemia.  Patient was admitted in June for dehydration associated cramps and was found to have acute which resolved during the hospital course of stay.  Patient was advised to stop his metformin, lisinopril and Pravachol after discharge. Patient hasn't started taking any of these medications since last 4 months. Patient comes in today as he wants to restart all his blood pressure medications.  Patient denies any cramps in last 4 months. He does complain that he has to get up but he frequently at night to urinate. No burning noted while urination.   Review of Systems  Constitutional: Negative for fever, activity change and appetite change.  HENT: Negative for sore throat.   Respiratory: Negative for cough and shortness of breath.   Cardiovascular: Negative for chest pain and leg swelling.  Gastrointestinal: Negative for nausea, abdominal pain, diarrhea, constipation and abdominal distention.  Genitourinary: Negative for frequency, hematuria and difficulty urinating.  Neurological: Negative for dizziness and headaches.  Psychiatric/Behavioral: Negative for suicidal ideas and behavioral problems.       Objective:   Physical Exam  Constitutional: He is oriented to person, place, and time. He appears well-developed and well-nourished.  HENT:  Head: Normocephalic and atraumatic.  Eyes: Conjunctivae and EOM are normal. Pupils are equal, round, and reactive to light. No scleral icterus.  Neck: Normal range of motion. Neck supple. No JVD present. No thyromegaly present.  Cardiovascular: Normal rate, regular rhythm, normal heart sounds and intact distal pulses.  Exam reveals no gallop and no friction rub.   No murmur heard. Pulmonary/Chest: Effort normal and breath sounds  normal. No respiratory distress. He has no wheezes. He has no rales.  Abdominal: Soft. Bowel sounds are normal. He exhibits no distension and no mass. There is no tenderness. There is no rebound and no guarding.  Musculoskeletal: Normal range of motion. He exhibits no edema and no tenderness.       Patient has chronic left foot postsurgical changes. There are no active wounds at this time.  Lymphadenopathy:    He has no cervical adenopathy.  Neurological: He is alert and oriented to person, place, and time.  Psychiatric: He has a normal mood and affect. His behavior is normal.          Assessment & Plan:

## 2011-03-24 NOTE — Assessment & Plan Note (Signed)
Patient had a stress test Myoview earlier this year which was negative for ischemia.

## 2011-03-24 NOTE — Assessment & Plan Note (Signed)
Patient's A1c 6.5 without being on treatment which is really good. I will start the patient on metformin 500 mg a day just because he is about the upper limit of normal.

## 2011-03-24 NOTE — Assessment & Plan Note (Signed)
Patient is hypertensive today and grade 1 range. Will restart his blood pressure medications. We'll check her blood profile in one week for potassium and creatinine. I'm also getting a metabolic profile today to establish a baseline.

## 2011-03-30 ENCOUNTER — Encounter: Payer: Medicare Other | Admitting: Internal Medicine

## 2011-04-30 ENCOUNTER — Encounter (HOSPITAL_COMMUNITY): Payer: Self-pay | Admitting: *Deleted

## 2011-04-30 ENCOUNTER — Emergency Department (INDEPENDENT_AMBULATORY_CARE_PROVIDER_SITE_OTHER)
Admission: EM | Admit: 2011-04-30 | Discharge: 2011-04-30 | Disposition: A | Discharge status: Home / Self Care | Payer: Medicare Other | Source: Home / Self Care | Attending: Family Medicine | Admitting: Family Medicine

## 2011-04-30 DIAGNOSIS — M109 Gout, unspecified: Secondary | ICD-10-CM

## 2011-04-30 MED ORDER — KETOROLAC TROMETHAMINE 30 MG/ML IJ SOLN
INTRAMUSCULAR | Status: AC
  Filled 2011-04-30: qty 1

## 2011-04-30 MED ORDER — KETOROLAC TROMETHAMINE 30 MG/ML IJ SOLN
30.0000 mg | Freq: Once | INTRAMUSCULAR | Status: AC
  Administered 2011-04-30: 30 mg via INTRAMUSCULAR

## 2011-04-30 MED ORDER — INDOMETHACIN 50 MG PO CAPS: 50.0000 mg | ORAL_CAPSULE | Freq: Two times a day (BID) | ORAL | Status: AC

## 2011-04-30 MED ORDER — COLCHICINE 0.6 MG PO TABS: 0.6000 mg | ORAL_TABLET | Freq: Four times a day (QID) | ORAL | Status: DC

## 2011-04-30 NOTE — ED Notes (Signed)
Pt sudden onset of pain left hand wrist - history gout

## 2011-04-30 NOTE — ED Provider Notes (Signed)
History     CSN: [id number]  Arrival date & time 04/30/11  1358   First MD Initiated Contact with Patient 04/30/11 1453      Chief Complaint  Patient presents with  . Wrist Pain  . Hand Pain    (Consider location/radiation/quality/duration/timing/severity/associated sxs/prior treatment) Patient is a 55 y.o. male presenting with wrist pain and hand pain. The history is provided by the patient and the spouse.  Wrist Pain This is a new problem. The current episode started 2 days ago. The problem occurs constantly. The problem has been gradually worsening. He has tried nothing for the symptoms.  Hand Pain    Past Medical History  Diagnosis Date  . Coronary artery disease   . Gout   . Hyperlipidemia   . Hypertension   . Osteomyelitis   . CTEV (congenital talipes equinovarus)     Past Surgical History  Procedure Date  . Neck surgery   . Foot surgery     Family History  Problem Relation Age of Onset  . Diabetes Mother   . Coronary artery disease Mother     HAS PACEMAKER  . Heart disease Mother   . Heart attack Brother     History  Substance Use Topics  . Smoking status: Former Smoker    Quit date: 07/06/2007  . Smokeless tobacco: Not on file  . Alcohol Use: Yes      Review of Systems  Constitutional: Negative.   Musculoskeletal: Positive for joint swelling.    Allergies  Morphine  Home Medications   Current Outpatient Rx  Name Route Sig Dispense Refill  . ASPIRIN 81 MG PO CHEW Oral Chew 1 tablet (81 mg total) by mouth daily. 30 tablet 11  . LISINOPRIL-HYDROCHLOROTHIAZIDE 10-12.5 MG PO TABS Oral Take 1 tablet by mouth daily. 30 tablet 11  . METFORMIN HCL 500 MG PO TABS Oral Take 1 tablet (500 mg total) by mouth as directed. Take 1 tab a day for 1 week and then 1 tab twice daily with meals for next 3 weeks. 60 tablet 11  . COLCHICINE 0.6 MG PO TABS Oral Take 1 tablet (0.6 mg total) by mouth 4 (four) times daily. 30 tablet 1  . INDOMETHACIN 50 MG PO  CAPS Oral Take 1 capsule (50 mg total) by mouth 2 (two) times daily with a meal. 30 capsule 1    BP 154/95  Pulse 89  Temp(Src) 98.2 F (36.8 C) (Oral)  Resp 19  SpO2 98%  Physical Exam  Nursing note and vitals reviewed. Constitutional: He appears well-developed and well-nourished.  Musculoskeletal: He exhibits tenderness.       Arms: Skin: Skin is warm and dry.    ED Course  Procedures (including critical care time)  Labs Reviewed - No data to display No results found.   1. Gout flare       MDM          Barkley Bruns, MD 04/30/11 [id number]

## 2011-12-09 ENCOUNTER — Encounter: Payer: Self-pay | Admitting: Internal Medicine

## 2012-01-16 ENCOUNTER — Ambulatory Visit (INDEPENDENT_AMBULATORY_CARE_PROVIDER_SITE_OTHER): Payer: Medicare Other | Admitting: Internal Medicine

## 2012-01-16 ENCOUNTER — Encounter: Payer: Self-pay | Admitting: Internal Medicine

## 2012-01-16 VITALS — BP 134/81 | HR 71 | Temp 97.1°F | Ht 67.5 in | Wt 238.3 lb

## 2012-01-16 DIAGNOSIS — E785 Hyperlipidemia, unspecified: Secondary | ICD-10-CM

## 2012-01-16 DIAGNOSIS — M109 Gout, unspecified: Secondary | ICD-10-CM

## 2012-01-16 DIAGNOSIS — E119 Type 2 diabetes mellitus without complications: Secondary | ICD-10-CM

## 2012-01-16 DIAGNOSIS — Z23 Encounter for immunization: Secondary | ICD-10-CM

## 2012-01-16 DIAGNOSIS — I2510 Atherosclerotic heart disease of native coronary artery without angina pectoris: Secondary | ICD-10-CM

## 2012-01-16 DIAGNOSIS — I10 Essential (primary) hypertension: Secondary | ICD-10-CM

## 2012-01-16 DIAGNOSIS — F528 Other sexual dysfunction not due to a substance or known physiological condition: Secondary | ICD-10-CM

## 2012-01-16 LAB — LIPID PANEL
HDL: 35 mg/dL — ABNORMAL LOW (ref >39)
LDL Cholesterol: 154 mg/dL — ABNORMAL HIGH (ref 0–99)
Total CHOL/HDL Ratio: 6.3 Ratio
Triglycerides: 148 mg/dL (ref <150)

## 2012-01-16 LAB — GLUCOSE, CAPILLARY: Glucose-Capillary: 126 mg/dL — ABNORMAL HIGH (ref 70–99)

## 2012-01-16 LAB — POCT GLYCOSYLATED HEMOGLOBIN (HGB A1C): Hemoglobin A1C: 7.1

## 2012-01-16 MED ORDER — ALLOPURINOL 100 MG PO TABS: 100.0000 mg | ORAL_TABLET | Freq: Every day | ORAL | Status: DC

## 2012-01-16 MED ORDER — METFORMIN HCL 1000 MG PO TABS: 1000.0000 mg | ORAL_TABLET | Freq: Two times a day (BID) | ORAL | Status: DC

## 2012-01-16 MED ORDER — INFLUENZA VAC TYPES A & B PF IM SUSP: 0.5000 mL | Freq: Once | INTRAMUSCULAR | Status: DC

## 2012-01-16 MED ORDER — TADALAFIL 20 MG PO TABS: ORAL_TABLET | ORAL | Status: DC

## 2012-01-16 MED ORDER — LISINOPRIL-HYDROCHLOROTHIAZIDE 10-12.5 MG PO TABS: 1.0000 | ORAL_TABLET | Freq: Every day | ORAL | Status: DC

## 2012-01-16 NOTE — Assessment & Plan Note (Signed)
I will check lipid profile today. Patient may benefit from statin due to the anti-inflammatory effects even if patient's LDL is under control. Since patient has not been taking any of his other medications at this time I would start statin at next office visit.

## 2012-01-16 NOTE — Progress Notes (Signed)
  Subjective:    Patient ID: Daniel Perkins, male    DOB: December 26, 1955, 56 y.o.   MRN: [id number]  HPI Patient is a 56 year old man with past medical history most significant for diabetes type 2, hyperlipidemia, hypertension and coronary artery disease.  He is here today for a routine followup.  Patient is out of all his medications. He has not taken any medication for about 15 days now.   His blood pressure is well-controlled at 134/81.  Patient has occasional pain in his wrist left side and he went to urgent care Center where he was told that this is a gout attack. Patient does not have any pain right now.  Patient is asking for something for erectile dysfunction.   Review of Systems  Constitutional: Negative for fever, activity change and appetite change.  HENT: Negative for sore throat.   Respiratory: Negative for cough and shortness of breath.   Cardiovascular: Negative for chest pain and leg swelling.  Gastrointestinal: Negative for nausea, abdominal pain, diarrhea, constipation and abdominal distention.  Genitourinary: Negative for frequency, hematuria and difficulty urinating.  Musculoskeletal: Positive for gait problem.  Neurological: Negative for dizziness and headaches.  Psychiatric/Behavioral: Negative for suicidal ideas and behavioral problems.       Objective:   Physical Exam  Constitutional: He is oriented to person, place, and time. He appears well-developed and well-nourished.  HENT:  Head: Normocephalic and atraumatic.  Eyes: Conjunctivae and EOM are normal. Pupils are equal, round, and reactive to light. No scleral icterus.  Neck: Normal range of motion. Neck supple. No JVD present. No thyromegaly present.  Cardiovascular: Normal rate, regular rhythm, normal heart sounds and intact distal pulses.  Exam reveals no gallop and no friction rub.   No murmur heard. Pulmonary/Chest: Effort normal and breath sounds normal. No respiratory distress. He has no wheezes.  He has no rales.  Abdominal: Soft. Bowel sounds are normal. He exhibits no distension and no mass. There is no tenderness. There is no rebound and no guarding.  Musculoskeletal: Normal range of motion. He exhibits no edema and no tenderness.       Patient has chronic skin changes in both his feet secondary to multiple operations in the past.  Lymphadenopathy:    He has no cervical adenopathy.  Neurological: He is alert and oriented to person, place, and time.  Psychiatric: He has a normal mood and affect. His behavior is normal.          Assessment & Plan:

## 2012-01-16 NOTE — Assessment & Plan Note (Signed)
Well-controlled  at this time 

## 2012-01-16 NOTE — Assessment & Plan Note (Signed)
Patient's last uric acid level was checked one year ago and it was 10.  I will start the patient on allopurinol 100 mg daily. I would check uric acid levels today, titrate allopurinol levels up gradually and check uric acid levels in 6-8 weeks.

## 2012-01-16 NOTE — Assessment & Plan Note (Signed)
I will keep the patient on metformin at this time.

## 2012-01-16 NOTE — Assessment & Plan Note (Signed)
Patient was given a prescription of 3 tablets of 20 mg tablet of tadalafil. Patient was given instructions on adverse effects of phosphodiesterase inhibitors. He was told to come to ER or call 911 if he experiences chest pain/shortness of breath while using the drug. He understands this risk and wants to try as this is affecting his life severely.

## 2012-01-16 NOTE — Assessment & Plan Note (Addendum)
Restarted on ASA and BP meds. Check lipid profile today.

## 2012-01-16 NOTE — Patient Instructions (Addendum)

## 2012-01-17 ENCOUNTER — Telehealth: Payer: Self-pay | Admitting: Internal Medicine

## 2012-01-17 LAB — COMPLETE METABOLIC PANEL WITH GFR
AST: 23 U/L (ref 0–37)
Albumin: 4 g/dL (ref 3.5–5.2)
Alkaline Phosphatase: 95 U/L (ref 39–117)
GFR, Est Non African American: 78 mL/min
Glucose, Bld: 123 mg/dL — ABNORMAL HIGH (ref 70–99)
Potassium: 4.9 mEq/L (ref 3.5–5.3)
Sodium: 140 mEq/L (ref 135–145)
Total Bilirubin: 0.4 mg/dL (ref 0.3–1.2)
Total Protein: 7.3 g/dL (ref 6.0–8.3)

## 2012-01-17 MED ORDER — PRAVASTATIN SODIUM 20 MG PO TABS: 20.0000 mg | ORAL_TABLET | Freq: Every evening | ORAL | Status: DC

## 2012-01-17 NOTE — Telephone Encounter (Signed)
Telephone call was placed to review the results of his most recent labs. Patient was asked to start taking pravastatin which he agreed. I would followup with office visit in one month.

## 2012-02-16 ENCOUNTER — Encounter: Payer: Medicare Other | Admitting: Internal Medicine

## 2012-02-20 ENCOUNTER — Ambulatory Visit (HOSPITAL_COMMUNITY)
Admission: RE | Admit: 2012-02-20 | Discharge: 2012-02-20 | Disposition: A | Discharge status: Home / Self Care | Payer: Medicare Other | Source: Ambulatory Visit | Attending: Internal Medicine | Admitting: Internal Medicine

## 2012-02-20 ENCOUNTER — Ambulatory Visit (INDEPENDENT_AMBULATORY_CARE_PROVIDER_SITE_OTHER): Payer: Medicare Other | Admitting: Internal Medicine

## 2012-02-20 ENCOUNTER — Encounter: Payer: Self-pay | Admitting: Internal Medicine

## 2012-02-20 ENCOUNTER — Encounter: Payer: Medicare Other | Admitting: Internal Medicine

## 2012-02-20 VITALS — BP 157/90 | HR 89 | Temp 98.1°F | Ht 67.0 in | Wt 241.9 lb

## 2012-02-20 DIAGNOSIS — R0609 Other forms of dyspnea: Secondary | ICD-10-CM | POA: Insufficient documentation

## 2012-02-20 DIAGNOSIS — I10 Essential (primary) hypertension: Secondary | ICD-10-CM

## 2012-02-20 DIAGNOSIS — R079 Chest pain, unspecified: Secondary | ICD-10-CM

## 2012-02-20 DIAGNOSIS — R002 Palpitations: Secondary | ICD-10-CM

## 2012-02-20 DIAGNOSIS — R0600 Dyspnea, unspecified: Secondary | ICD-10-CM | POA: Insufficient documentation

## 2012-02-20 DIAGNOSIS — E785 Hyperlipidemia, unspecified: Secondary | ICD-10-CM

## 2012-02-20 DIAGNOSIS — M109 Gout, unspecified: Secondary | ICD-10-CM

## 2012-02-20 DIAGNOSIS — E669 Obesity, unspecified: Secondary | ICD-10-CM | POA: Insufficient documentation

## 2012-02-20 DIAGNOSIS — R0989 Other specified symptoms and signs involving the circulatory and respiratory systems: Secondary | ICD-10-CM

## 2012-02-20 DIAGNOSIS — R9431 Abnormal electrocardiogram [ECG] [EKG]: Secondary | ICD-10-CM | POA: Insufficient documentation

## 2012-02-20 DIAGNOSIS — E119 Type 2 diabetes mellitus without complications: Secondary | ICD-10-CM

## 2012-02-20 LAB — TSH: TSH: 0.819 u[IU]/mL (ref 0.350–4.500)

## 2012-02-20 LAB — CBC
HCT: 39.7 % (ref 39.0–52.0)
Hemoglobin: 12.8 g/dL — ABNORMAL LOW (ref 13.0–17.0)
MCH: 22 pg — ABNORMAL LOW (ref 26.0–34.0)
MCHC: 32.2 g/dL (ref 30.0–36.0)
RDW: 14.5 % (ref 11.5–15.5)

## 2012-02-20 LAB — GLUCOSE, CAPILLARY: Glucose-Capillary: 157 mg/dL — ABNORMAL HIGH (ref 70–99)

## 2012-02-20 MED ORDER — METFORMIN HCL 1000 MG PO TABS: 1000.0000 mg | ORAL_TABLET | Freq: Two times a day (BID) | ORAL | Status: DC

## 2012-02-20 MED ORDER — LISINOPRIL-HYDROCHLOROTHIAZIDE 10-12.5 MG PO TABS: 1.0000 | ORAL_TABLET | Freq: Every day | ORAL | Status: DC

## 2012-02-20 MED ORDER — PRAVASTATIN SODIUM 20 MG PO TABS: 20.0000 mg | ORAL_TABLET | Freq: Every evening | ORAL | Status: DC

## 2012-02-20 MED ORDER — ALLOPURINOL 100 MG PO TABS: 100.0000 mg | ORAL_TABLET | Freq: Every day | ORAL | Status: DC

## 2012-02-20 NOTE — Progress Notes (Signed)
Subjective:     Patient ID: Daniel Perkins, male   DOB: 12/31/1955, 56 y.o.   MRN: [id number]  HPI Daniel Perkins has a history of hypertension, hyperlipidemia, and DM. He presents today with shortness of breath on exertion for the past 2 weeks. He describes shortness of breath, substernal burning, and occasionally a sharp pain in his R chest with walking 10-15 steps. There is no radiation to either arm/shoulder. He also feels light headed "like I'm going to pass out" and there are associated heart palpitations. The episodes are occuring ~4 times per day and resolve over ~45 seconds with rest. On some days when his symptoms "don't kick in", he can walk up to 2 city blocks without dyspnea. There are no identifiable triggers for his dyspnea other than exertion, and he has no history of allergies or asthma. He feels his symptoms are similar to those he had leading up to his hospitalization 2 years ago with an extensive cardiac work-up. He quit smoking 3 years ago and was using an exercise bike 15 minutes daily until about two weeks ago. No recreational drugs or alcohol. He expressed concern about his diet and is wanting to lose weight. However, he feels he has been eating more lately, particularly drinking up to 3 L of non-diet soda per day. He has not been taking any medications, because he has "no money" to fill his prescriptions. A relative has contacted him to make sure he get and takes his medications, and he is interested in refills today. A separate complaint is abdominal cramping pain when he "has a good laugh." He has a history of similar cramps that per the patient may be related to dehydrations. He also previously had an elevated CK, though to be a result of rhabdomyolysis 2/2 statin treatment.  Review of Systems  Constitutional: Negative for fever, appetite change and unexpected weight change.  Respiratory: Positive for shortness of breath. Negative for cough and chest tightness.     Cardiovascular: Positive for chest pain and palpitations. Negative for leg swelling.  Gastrointestinal: Positive for abdominal pain. Negative for nausea, vomiting, diarrhea and blood in stool.  Genitourinary: Positive for frequency. Negative for dysuria and difficulty urinating.  Neurological: Positive for light-headedness. Negative for dizziness and syncope.       Objective:   Physical Exam  Constitutional: He is oriented to person, place, and time. He appears well-developed and well-nourished. No distress.  HENT:  Head: Normocephalic and atraumatic.  Cardiovascular: Normal rate, regular rhythm, normal heart sounds and intact distal pulses.  Exam reveals no gallop and no friction rub.   No murmur heard. Pulmonary/Chest: Effort normal and breath sounds normal. No respiratory distress. He has no wheezes. He has no rales.  Abdominal: Soft. Bowel sounds are normal. He exhibits no distension. There is no tenderness.  Neurological: He is alert and oriented to person, place, and time.   ECG today was unchanged from prior. Left axis deviation, inverted T-waves in precordial leads, and biphasic P waves.     Assessment:     1. SOB, chest pain, light headedness, palpitations - Symptoms are consistent with cardiopulmonary disease such as heart ischemia or arrhythmia. Arrhythmia is possible despite the unchanged ECG. Ischemia is unlikely given his essentially normal cardiac catheterization in 2011 and unchanged ECG today. The differential also includes thyroid disease, anemia, COPD, and asthma. Lung disease is less likely given his normal PFTs within the last few years.  2. Abdominal pain - His cramping pain is most likely related  to dehydration. Recurrent rhabdomyolysis is unlikely since he is not currently taking pravastatin.  3. Medical non-compliance - The patient describes financial constraints, although many of his medications are on the $4 list at [facility]. He also has a relative encouraging  him to stay on his medications and offering financial assistance.  4. Diet and weight gain - The patient acknowledges that his diet is no optimal and expresses interest in changes and weight loss.    Plan:     1. SOB, chest pain, light headedness, palpitations - The patient was referred to cardiology for Holter monitoring for a potential intermittent arrhythmia. We will assess thyroid function with TSH and potential anemia with CBC today. If no cause for his symptoms is identified, he will likely need more extensive cardiopulmonary testing.  2. Abdominal cramping - The patient was encouraged to hydrate well with water. He was also warned that high amounts of soda intake can contribute to cramping.  3. Medical non-compliance - The patient was encouraged to consistently take his medications and to stay accountable with his relative. His metformin, lisinopril, allopurinol, and tadalafil were refilled today.  At least two are on the $4 list. He should also resume taking 81 mg aspirin.He deferred an eye exam with ophthalmology, to be discussed at his next appointment.  4. Diet and weight gain - The patient was advised to decrease or eliminate his soda intake, as this can also contribute to his weight gain. He was referred for diabetic diet counseling.

## 2012-02-20 NOTE — Assessment & Plan Note (Signed)
BMI is 37. Pt motivated to lose weight and ABD girth for daughter's wedding. Willing to see Tobey Bride so will set that up. Simple things like stop soda, resume exercise bike, small changes to diet.

## 2012-02-20 NOTE — Assessment & Plan Note (Signed)
Not taking statin so no need to recheck FLP. Resume statin at current dose. No need to check CK as not c/w sxs and not on statin and last CK was nl.

## 2012-02-20 NOTE — Assessment & Plan Note (Signed)
A1C not too bad even though not on meds. Will resume metformin at current dose. Doubt today's sxs are related to hypoglycemia.

## 2012-02-20 NOTE — Progress Notes (Signed)
  Subjective:    Patient ID: Daniel Perkins, male    DOB: 1955/12/31, 56 y.o.   MRN: [id number]  HPI  Daniel Perkins is seen today for and acute exacerbation of dyspnea on exertion. He has had this in the past and has had an extensive cardiac W/u, PFT's, and labs without an etiology found. His sxs restarted a couple of weeks ago and are happening daily. He gets DOE, dizzy, a burning in his chest, feels like he is going to pass out, and feels that his heart is speeding up. He sits down and the sxs all resolve in 45 seconds. These sxs never occur at rest or at night. There is no trigger - allergens, hot weather, stress, etc. These sxs just "kick in." when they do not kick in, he can walk 2 city blocks with DOE. Most of his exertion is limited by his L foot and the repeat ortho surgeries he has had. He started riding an exercise bicycle about one month ago to decrease ABD girth to be able to fit into his tux for his daughter's wedding. He used it about 15 min three times a day but stopped 2 weeks ago bc he hadn't seen a decrease in his ABD girth. He did not get SOB while exercising. No h/o asthma, use of MDI's.  Prior cardiac W/I : cath 2011 minimal coronary obstruction. Stress test 2012 normal. EF nl.   Prior PFT's : FEV1 / FVC and FEV1 all nl. No response to bronchodilators.   Also had DM. Not taking metformin. A1C 7.1 01/2012. Cannot afford eye exam 2/2 co pay.  Also has HTN but off meds 2/2 cost. Sister will help with cost of meds.   Also has obesity. Has gained weight 2/2 decrease activity after L foot surgeries.    Review of Systems  Constitutional: Positive for activity change, appetite change and unexpected weight change.  Respiratory: Positive for chest tightness and shortness of breath. Negative for cough.   Cardiovascular: Positive for chest pain and palpitations. Negative for leg swelling.  Neurological: Positive for dizziness and light-headedness.       Objective:   Physical  Exam  Constitutional: He is oriented to person, place, and time. He appears well-developed and well-nourished. No distress.  HENT:  Head: Normocephalic and atraumatic.  Right Ear: External ear normal.  Left Ear: External ear normal.  Nose: Nose normal.  Eyes: Conjunctivae normal and EOM are normal. No scleral icterus.  Cardiovascular: Normal rate, regular rhythm, normal heart sounds and intact distal pulses.  Exam reveals no friction rub.   No murmur heard. Pulmonary/Chest: Effort normal and breath sounds normal. No respiratory distress. He has no wheezes.  Musculoskeletal: Normal range of motion. He exhibits no edema and no tenderness.  Neurological: He is alert and oriented to person, place, and time.  Skin: Skin is warm and dry. He is not diaphoretic.       Surgical scars  Psychiatric: He has a normal mood and affect. His behavior is normal. Judgment and thought content normal.          Assessment & Plan:

## 2012-02-20 NOTE — Assessment & Plan Note (Addendum)
Diff dx  1. Cardiac A. Ischemia - EKG today unchanged from prior and show no acute ischemic changes. Doubt this represents unstable angina as pt had non-obstructive CAD on cath just in 2011. Doubt excelerated CAD as no cocaine history. B. CHF - all EF's were nl. No volume overload - peripheral or pulmonary.  C. Arrythmia - this is high on diff dx. Will sch Holter monitor to R/O arrythmia during episodes of DOE. Check TSH.  2. Pul A. COPD - PFT's 2011 were nl. Pt quit tobacco 3.5 yrs ago. Doesn't get DOE when riding bicycle 15 min unless the sxs "kick in" sp doubt sig obstruction. B. Asthma - no hx. Never used MDI. PFT's might not show reversible obstruction but do not want to Rx albuterol while arrythmia is being W/U.  C. Chronic PE - Has had several ortho procedures. Low on diff dx. No R ventricle dysfxn.  3. Anemia - slight decrease in HgB last CBC so will recheck. Ferritin was normal 10/2010.  4. Thyroid D/O - check TSH but low on prob list.  If labs and Holter nl, consider cardiopulmonary stress testing to see if this is pulmonary or cardiac reserve issue. Will F/U one month.

## 2012-02-20 NOTE — Patient Instructions (Signed)
Your medications should be ready for you at [facility]. Remember to take aspirin 81 mg once every day. See Lupita Leash for nutrition and weight loss. See [place] in 1 month for follow-up. We have referred you to the cardiology clinic for your Holter monitor.

## 2012-02-20 NOTE — Assessment & Plan Note (Signed)
Poorly controlled bc not taking meds. Resume meds at current dose and recheck next visit. Stressed importance of BP control.

## 2012-02-21 ENCOUNTER — Encounter: Payer: Medicare Other | Admitting: Internal Medicine

## 2012-02-22 ENCOUNTER — Telehealth: Payer: Self-pay | Admitting: *Deleted

## 2012-02-22 DIAGNOSIS — R0600 Dyspnea, unspecified: Secondary | ICD-10-CM

## 2012-02-22 DIAGNOSIS — R079 Chest pain, unspecified: Secondary | ICD-10-CM

## 2012-02-22 NOTE — Telephone Encounter (Signed)
Signed new order.  Thanks

## 2012-02-22 NOTE — Telephone Encounter (Signed)
Order placed for 48 hour holter monitor.  Ambulatory referral to Cardiology was removed ([place] needed the actual test order and not the referral to be entered into Epic for the holter monitor).  Will route to ordering to maker her aware of order.  Will also route telephone note to Windell Moulding @ [place] to make her aware that order has been placed.Kingsley Spittle Cassady10/16/201312:57 PM

## 2012-02-29 ENCOUNTER — Encounter (INDEPENDENT_AMBULATORY_CARE_PROVIDER_SITE_OTHER): Payer: Medicare Other

## 2012-02-29 DIAGNOSIS — R002 Palpitations: Secondary | ICD-10-CM

## 2012-02-29 DIAGNOSIS — R0600 Dyspnea, unspecified: Secondary | ICD-10-CM

## 2012-02-29 DIAGNOSIS — R079 Chest pain, unspecified: Secondary | ICD-10-CM

## 2012-03-01 NOTE — Addendum Note (Signed)
Addended by: Hassan Buckler on: 03/01/2012 10:45 AM   Modules accepted: Orders

## 2012-03-02 ENCOUNTER — Encounter (HOSPITAL_COMMUNITY): Payer: Self-pay | Admitting: *Deleted

## 2012-03-02 ENCOUNTER — Emergency Department (INDEPENDENT_AMBULATORY_CARE_PROVIDER_SITE_OTHER)
Admission: EM | Admit: 2012-03-02 | Discharge: 2012-03-02 | Disposition: A | Discharge status: Home / Self Care | Payer: Medicare Other | Source: Home / Self Care

## 2012-03-02 DIAGNOSIS — S339XXA Sprain of unspecified parts of lumbar spine and pelvis, initial encounter: Secondary | ICD-10-CM

## 2012-03-02 DIAGNOSIS — S39012A Strain of muscle, fascia and tendon of lower back, initial encounter: Secondary | ICD-10-CM

## 2012-03-02 MED ORDER — HYDROCODONE-ACETAMINOPHEN 5-325 MG PO TABS: 1.0000 | ORAL_TABLET | ORAL | Status: DC | PRN

## 2012-03-02 MED ORDER — KETOROLAC TROMETHAMINE 30 MG/ML IJ SOLN
INTRAMUSCULAR | Status: AC
  Filled 2012-03-02: qty 2

## 2012-03-02 MED ORDER — KETOROLAC TROMETHAMINE 60 MG/2ML IM SOLN
60.0000 mg | Freq: Once | INTRAMUSCULAR | Status: AC
  Administered 2012-03-02: 60 mg via INTRAMUSCULAR

## 2012-03-02 MED ORDER — CYCLOBENZAPRINE HCL 5 MG PO TABS: 5.0000 mg | ORAL_TABLET | Freq: Three times a day (TID) | ORAL | Status: DC | PRN

## 2012-03-02 MED ORDER — NAPROXEN 500 MG PO TABS: 500.0000 mg | ORAL_TABLET | Freq: Two times a day (BID) | ORAL | Status: DC

## 2012-03-02 NOTE — ED Provider Notes (Signed)
History     CSN: [id number]  Arrival date & time 03/02/12  1020   None     Chief Complaint  Patient presents with  . Back Pain    (Consider location/radiation/quality/duration/timing/severity/associated sxs/prior treatment) HPI Comments: 56 year old male stated that 5 days ago he slipped off his truck and twisted his back. The time experienced pain in his left back that was not severe over the past 4-5 days he has been lifting heavy objects and having to lift him turn his mother who is debilitated in bed. This has exacerbated his pain and now he has severe left low back muscular skeletal pain. Pain is worse with movemen,t rotation of the trunk and trying to get to a sitting position from a supine position. Pain is located in the left low back radiates to the left buttock. No distal paresthesias or motor weakness.  Patient is a 56 y.o. male presenting with back pain.  Back Pain  Pertinent negatives include no numbness, no headaches and no weakness.    Past Medical History  Diagnosis Date  . Coronary artery disease   . Gout   . Hyperlipidemia   . Hypertension   . Osteomyelitis   . CTEV (congenital talipes equinovarus)     Past Surgical History  Procedure Date  . Neck surgery   . Foot surgery     Family History  Problem Relation Age of Onset  . Diabetes Mother   . Coronary artery disease Mother     HAS PACEMAKER  . Heart disease Mother   . Heart attack Brother     History  Substance Use Topics  . Smoking status: Former Smoker    Quit date: 07/06/2007  . Smokeless tobacco: Not on file  . Alcohol Use: Yes      Review of Systems  Constitutional: Negative.   Respiratory: Negative.   Gastrointestinal: Negative.   Genitourinary: Negative.   Musculoskeletal: Positive for back pain.       As per HPI  Skin: Negative.   Neurological: Negative for dizziness, weakness, numbness and headaches.    Allergies  Morphine  Home Medications   Current Outpatient Rx    Name Route Sig Dispense Refill  . ALLOPURINOL 100 MG PO TABS Oral Take 1 tablet (100 mg total) by mouth daily. 30 tablet 5  . ASPIRIN 81 MG PO CHEW Oral Chew 1 tablet (81 mg total) by mouth daily. 30 tablet 11  . CYCLOBENZAPRINE HCL 5 MG PO TABS Oral Take 1 tablet (5 mg total) by mouth 3 (three) times daily as needed for muscle spasms. 30 tablet 0  . HYDROCODONE-ACETAMINOPHEN 5-325 MG PO TABS Oral Take 1 tablet by mouth every 4 (four) hours as needed for pain. 12 tablet 0  . LISINOPRIL-HYDROCHLOROTHIAZIDE 10-12.5 MG PO TABS Oral Take 1 tablet by mouth daily. 30 tablet 5  . METFORMIN HCL 1000 MG PO TABS Oral Take 1 tablet (1,000 mg total) by mouth 2 (two) times daily with a meal. 60 tablet 5  . NAPROXEN 500 MG PO TABS Oral Take 1 tablet (500 mg total) by mouth 2 (two) times daily. 20 tablet 0  . PRAVASTATIN SODIUM 20 MG PO TABS Oral Take 1 tablet (20 mg total) by mouth every evening. 30 tablet 5  . TADALAFIL 20 MG PO TABS  Take 1/2 tab (10 mg) 30 minutes before intercourse. 3 tablet 0    BP 125/94  Pulse 108  Temp 98.2 F (36.8 C) (Oral)  Resp 20  SpO2 98%  Physical Exam  Constitutional: He is oriented to person, place, and time. He appears well-developed and well-nourished.  HENT:  Head: Normocephalic and atraumatic.  Eyes: EOM are normal. Left eye exhibits no discharge.  Neck: Normal range of motion. Neck supple.  Musculoskeletal:       Tenderness in the left para lumbar musculature and gluteus maximus. This pain is reproduced with movement. There is no spinal tenderness or distal paresthesias or weakness.  Neurological: He is alert and oriented to person, place, and time. No cranial nerve deficit.  Skin: Skin is warm and dry.  Psychiatric: He has a normal mood and affect.    ED Course  Procedures (including critical care time)  Labs Reviewed - No data to display No results found.   1. Lumbosacral strain       MDM  Flexeril 5 mg 3 times a day when necessary muscle  spasm Norco 5 mg every 4-6 hours when necessary pain #12 Naprosyn 500 mg twice a day when necessary back pain No pulling bending lifting for the next 10-14 days. Stretches as demonstrated in exercises as instructed.        Hayden Rasmussen, NP 03/02/12 1159

## 2012-03-02 NOTE — ED Notes (Signed)
Pt  Reports      Low  Back   Pain        X 3  Days            Pt  Reports     Pain  On  Certain    Movements      And  posistions            Such  As  Getting  Out of  Bed          He  Is  A   Diabetic  And hypertensive

## 2012-03-03 NOTE — ED Provider Notes (Signed)
Medical screening examination/treatment/procedure(s) were performed by resident physician or non-physician practitioner and as supervising physician I was immediately available for consultation/collaboration.   Ceciley Buist DOUGLAS MD.    Rileyann Florance D Marlana Mckowen, MD 03/03/12 1433 

## 2012-03-09 ENCOUNTER — Encounter: Payer: Self-pay | Admitting: Internal Medicine

## 2012-03-10 ENCOUNTER — Encounter (HOSPITAL_COMMUNITY): Payer: Self-pay | Admitting: Emergency Medicine

## 2012-03-10 ENCOUNTER — Emergency Department (HOSPITAL_COMMUNITY)
Admission: EM | Admit: 2012-03-10 | Discharge: 2012-03-10 | Disposition: A | Discharge status: Home / Self Care | Payer: Medicare Other | Attending: Emergency Medicine | Admitting: Emergency Medicine

## 2012-03-10 DIAGNOSIS — I10 Essential (primary) hypertension: Secondary | ICD-10-CM | POA: Insufficient documentation

## 2012-03-10 DIAGNOSIS — E119 Type 2 diabetes mellitus without complications: Secondary | ICD-10-CM | POA: Insufficient documentation

## 2012-03-10 DIAGNOSIS — M869 Osteomyelitis, unspecified: Secondary | ICD-10-CM | POA: Insufficient documentation

## 2012-03-10 DIAGNOSIS — S39012A Strain of muscle, fascia and tendon of lower back, initial encounter: Secondary | ICD-10-CM

## 2012-03-10 DIAGNOSIS — S335XXA Sprain of ligaments of lumbar spine, initial encounter: Secondary | ICD-10-CM | POA: Insufficient documentation

## 2012-03-10 DIAGNOSIS — Z79899 Other long term (current) drug therapy: Secondary | ICD-10-CM | POA: Insufficient documentation

## 2012-03-10 DIAGNOSIS — I2510 Atherosclerotic heart disease of native coronary artery without angina pectoris: Secondary | ICD-10-CM | POA: Insufficient documentation

## 2012-03-10 DIAGNOSIS — M109 Gout, unspecified: Secondary | ICD-10-CM | POA: Insufficient documentation

## 2012-03-10 DIAGNOSIS — E785 Hyperlipidemia, unspecified: Secondary | ICD-10-CM | POA: Insufficient documentation

## 2012-03-10 DIAGNOSIS — Y9389 Activity, other specified: Secondary | ICD-10-CM | POA: Insufficient documentation

## 2012-03-10 DIAGNOSIS — Z87891 Personal history of nicotine dependence: Secondary | ICD-10-CM | POA: Insufficient documentation

## 2012-03-10 DIAGNOSIS — Y929 Unspecified place or not applicable: Secondary | ICD-10-CM | POA: Insufficient documentation

## 2012-03-10 DIAGNOSIS — X500XXA Overexertion from strenuous movement or load, initial encounter: Secondary | ICD-10-CM | POA: Insufficient documentation

## 2012-03-10 MED ORDER — HYDROCODONE-ACETAMINOPHEN 5-325 MG PO TABS
2.0000 | ORAL_TABLET | Freq: Once | ORAL | Status: AC
  Administered 2012-03-10: 2 via ORAL
  Filled 2012-03-10: qty 2

## 2012-03-10 MED ORDER — DIAZEPAM 5 MG PO TABS: 5.0000 mg | ORAL_TABLET | Freq: Four times a day (QID) | ORAL | Status: DC | PRN

## 2012-03-10 MED ORDER — HYDROCODONE-ACETAMINOPHEN 5-325 MG PO TABS: 2.0000 | ORAL_TABLET | ORAL | Status: DC | PRN

## 2012-03-10 MED ORDER — HYDROMORPHONE HCL PF 1 MG/ML IJ SOLN
1.0000 mg | Freq: Once | INTRAMUSCULAR | Status: AC
  Administered 2012-03-10: 1 mg via INTRAVENOUS
  Filled 2012-03-10: qty 1

## 2012-03-10 MED ORDER — KETOROLAC TROMETHAMINE 60 MG/2ML IM SOLN
60.0000 mg | Freq: Once | INTRAMUSCULAR | Status: AC
  Administered 2012-03-10: 60 mg via INTRAMUSCULAR
  Filled 2012-03-10: qty 2

## 2012-03-10 MED ORDER — IBUPROFEN 800 MG PO TABS: 800.0000 mg | ORAL_TABLET | Freq: Three times a day (TID) | ORAL | Status: DC

## 2012-03-10 MED ORDER — DIAZEPAM 5 MG PO TABS
5.0000 mg | ORAL_TABLET | Freq: Once | ORAL | Status: AC
  Administered 2012-03-10: 5 mg via ORAL
  Filled 2012-03-10: qty 1

## 2012-03-10 NOTE — ED Notes (Signed)
Pt's initial 02 sats were in the mid 80s which may be due to the 250 mcg fentanyl given en route.  Pt placed on 2 L [place].  96% now.

## 2012-03-10 NOTE — ED Provider Notes (Signed)
History     CSN: [id number]  Arrival date & time 03/10/12  4098   First MD Initiated Contact with Patient 03/10/12 1029      Chief Complaint  Patient presents with  . Back Pain    (Consider location/radiation/quality/duration/timing/severity/associated sxs/prior treatment) The history is provided by the patient.  Daniel Perkins is a 56 y.o. male history of hyperlipidemia, hypertension, diabetes here presenting with back pain. Lower back pain for 2 weeks after moving heavy items. He went to urgent care 5 days ago and was given Flexeril, Norco, Naprosyn. He felt better afterwards but yesterday he missed a step and he noticed it worse pain and now it radiates to his left leg. Denies any bowel or bladder incontinence. No weakness or numbness. He took his Norco and it didn't help. No trauma or other injuries.    Past Medical History  Diagnosis Date  . Coronary artery disease     Holter monitor 02/2012 - sinus with rare PVC  . Gout   . Hyperlipidemia   . Hypertension   . Osteomyelitis   . CTEV (congenital talipes equinovarus)   . Diabetes mellitus without complication     Past Surgical History  Procedure Date  . Neck surgery   . Foot surgery     Family History  Problem Relation Age of Onset  . Diabetes Mother   . Coronary artery disease Mother     HAS PACEMAKER  . Heart disease Mother   . Heart attack Brother     History  Substance Use Topics  . Smoking status: Former Smoker    Quit date: 07/06/2007  . Smokeless tobacco: Not on file  . Alcohol Use: Yes      Review of Systems  Musculoskeletal: Positive for back pain.  All other systems reviewed and are negative.    Allergies  Morphine  Home Medications   Current Outpatient Rx  Name Route Sig Dispense Refill  . CYCLOBENZAPRINE HCL 5 MG PO TABS Oral Take 5 mg by mouth 3 (three) times daily as needed.    Marland Kitchen HYDROCODONE-ACETAMINOPHEN 5-325 MG PO TABS Oral Take 1 tablet by mouth every 4 (four) hours as  needed. For pain    . LISINOPRIL-HYDROCHLOROTHIAZIDE 10-12.5 MG PO TABS Oral Take 1 tablet by mouth daily.    Marland Kitchen METFORMIN HCL 1000 MG PO TABS Oral Take 1,000 mg by mouth 2 (two) times daily with a meal.    . TADALAFIL 20 MG PO TABS  Take 1/2 tab (10 mg) 30 minutes before intercourse. 3 tablet 0    BP 136/71  Pulse 79  Temp 98.7 F (37.1 C) (Oral)  Resp 14  SpO2 100%  Physical Exam  Nursing note and vitals reviewed. Constitutional: He is oriented to person, place, and time. He appears well-developed and well-nourished.       Uncomfortable   HENT:  Head: Normocephalic.  Mouth/Throat: Oropharynx is clear and moist.  Eyes: Conjunctivae normal are normal. Pupils are equal, round, and reactive to light.  Neck: Normal range of motion. Neck supple.  Cardiovascular: Normal rate, regular rhythm and normal heart sounds.   Pulmonary/Chest: Effort normal and breath sounds normal. No respiratory distress. He has no wheezes.  Abdominal: Soft. Bowel sounds are normal. He exhibits no distension. There is no tenderness. There is no rebound.  Musculoskeletal: Normal range of motion. He exhibits no edema.       + L paralumbar tenderness with spasms. + straight leg raise on L leg.   Neurological:  He is alert and oriented to person, place, and time.       No saddle anesthesia.   Skin: Skin is warm and dry.  Psychiatric: His behavior is normal. Judgment and thought content normal.    ED Course  Procedures (including critical care time)  Labs Reviewed - No data to display No results found.   1. Lumbar spine strain       MDM  Daniel Perkins is a 56 y.o. male here with back pain. Likely sciatica. No neurovascular compromise. Will give pain meds and reassess.   1:14 PM Pain improved. Able to ambulate. Will d/c home. Recommend changing flexeril to valium. Will refill his norco. Recommend adding motrin 800mg  Q6 hrs.         Richardean Canal, MD 03/10/12 1315

## 2012-03-10 NOTE — ED Notes (Signed)
[id number]> Expected date:03/10/12<BR> Expected time:<BR> Means of arrival:<BR> Comments:<BR> BACK PAIN

## 2012-03-10 NOTE — ED Notes (Signed)
Per EMS: Pt c/o pain from mid left that shoots all the way down his left leg.  Unsure of what he did to cause pain.  Pt went to urgent care on Tuesday and was given flexeril.  Helped a little bit.  Pain 10/10.  Pt was diaphoretic and crying on scene.  Was given 250 mcg fentanyl en route.

## 2012-03-14 ENCOUNTER — Encounter: Payer: Self-pay | Admitting: Internal Medicine

## 2012-03-14 ENCOUNTER — Ambulatory Visit (INDEPENDENT_AMBULATORY_CARE_PROVIDER_SITE_OTHER): Payer: Medicare Other | Admitting: Internal Medicine

## 2012-03-14 VITALS — BP 135/95 | HR 112 | Temp 97.0°F | Wt 241.6 lb

## 2012-03-14 DIAGNOSIS — R0609 Other forms of dyspnea: Secondary | ICD-10-CM

## 2012-03-14 DIAGNOSIS — R0600 Dyspnea, unspecified: Secondary | ICD-10-CM

## 2012-03-14 DIAGNOSIS — R0989 Other specified symptoms and signs involving the circulatory and respiratory systems: Secondary | ICD-10-CM

## 2012-03-14 DIAGNOSIS — IMO0002 Reserved for concepts with insufficient information to code with codable children: Secondary | ICD-10-CM

## 2012-03-14 DIAGNOSIS — S3992XA Unspecified injury of lower back, initial encounter: Secondary | ICD-10-CM

## 2012-03-14 MED ORDER — KETOROLAC TROMETHAMINE 15 MG/ML IJ SOLN
15.0000 mg | Freq: Once | INTRAMUSCULAR | Status: AC
  Administered 2012-03-14: 15 mg via INTRAMUSCULAR

## 2012-03-14 MED ORDER — OXYCODONE-ACETAMINOPHEN 5-325 MG PO TABS: 1.0000 | ORAL_TABLET | ORAL | Status: DC | PRN

## 2012-03-14 MED ORDER — GABAPENTIN 300 MG PO CAPS: 300.0000 mg | ORAL_CAPSULE | Freq: Three times a day (TID) | ORAL | Status: DC

## 2012-03-14 MED ORDER — CYCLOBENZAPRINE HCL 5 MG PO TABS: 5.0000 mg | ORAL_TABLET | Freq: Three times a day (TID) | ORAL | Status: DC | PRN

## 2012-03-14 NOTE — Patient Instructions (Addendum)
Stop taking Norco (hydrocodone-acetaminophen).  Start taking oxycodone-acetaminophen, 1 to 2 tablets every 4 hours as needed for pain.  Start taking gabapentin.  On the first day, take 1 300mg  tablet; then on the second day take 1 tablet in the morning and 1 in the evening; then start taking 1 tablet three times daily.  Continue taking ibuprofen 800mg  every 8 hours as needed for pain.  Do not take more than this.  Do not take other NSAIDs (ibuprofen, naproxen, Aleve, Motrin, Advil, etc.)  Continue taking cyclobenzaprine 5mg  thrice daily as needed for pain and muscle spasms.  Remember not to take more than 3,000mg  of acetaminophen in 24 hours.  Percocet, Norco, Vicodin, Tylenol, and many other medications have acetaminophen in them; so make sure that the total dose from all sources does not add up to more than 3,000mg .  Attend physical therapy sessions.   Lumbosacral Strain  Lumbosacral strain is one of the most common causes of back pain. There are many causes of back pain. Most are not serious conditions.  CAUSES  Your backbone (spinal column) is made up of 24 main vertebral bodies, the sacrum, and the coccyx. These are held together by muscles and tough, fibrous tissue (ligaments). Nerve roots pass through the openings between the vertebrae. A sudden move or injury to the back may cause injury to, or pressure on, these nerves. This may result in localized back pain or pain movement (radiation) into the buttocks, down the leg, and into the foot. Sharp, shooting pain from the buttock down the back of the leg (sciatica) is frequently associated with a ruptured (herniated) disk. Pain may be caused by muscle spasm alone.  Your caregiver can often find the cause of your pain by the details of your symptoms and an exam. In some cases, you may need tests (such as X-rays). Your caregiver will work with you to decide if any tests are needed based on your specific exam.  HOME CARE INSTRUCTIONS  Avoid an  underactive lifestyle. Active exercise, as directed by your caregiver, is your greatest weapon against back pain.  Avoid hard physical activities (tennis, racquetball, waterskiing) if you are not in proper physical condition for it. This may aggravate or create problems.  If you have a back problem, avoid sports requiring sudden body movements. Swimming and walking are generally safer activities.  Maintain good posture.  Avoid becoming overweight (obese).  Use bed rest for only the most extreme, sudden (acute) episode. Your caregiver will help you determine how much bed rest is necessary.  For acute conditions, you may put ice on the injured area.  Put ice in a plastic bag.  Place a towel between your skin and the bag.  Leave the ice on for 15 to 20 minutes at a time, every 2 hours, or as needed.  After you are improved and more active, it may help to apply heat for 30 minutes before activities.  See your caregiver if you are having pain that lasts longer than expected. Your caregiver can advise appropriate exercises or therapy if needed. With conditioning, most back problems can be avoided.  SEEK IMMEDIATE MEDICAL CARE IF:  You have numbness, tingling, weakness, or problems with the use of your arms or legs.  You experience severe back pain not relieved with medicines.  There is a change in bowel or bladder control.  You have increasing pain in any area of the body, including your belly (abdomen).  You notice shortness of breath, dizziness, or feel  faint.  You feel sick to your stomach (nauseous), are throwing up (vomiting), or become sweaty.  You notice discoloration of your toes or legs, or your feet get very cold.  Your back pain is getting worse.  You have a fever.  MAKE SURE YOU:  Understand these instructions.  Will watch your condition.  Will get help right away if you are not doing well or get worse.  Herniated Disk  The bones of your spinal column (vertebrae) protect your spinal  cord and nerves that go into your arms and legs. The vertebrae are separated by disks that cushion the spinal column and put space between your vertebrae. This allows movement between the vertebrae, which allows you to bend, rotate, and move your body from side to side.  Sometimes, the disks move out of place (herniate) or break open (rupture) from injury or strain. The most common area for a disk herniation is in the lower back (lumbar area). Sometimes herniation occurs in the neck (cervical) disks.  CAUSES  As we grow older, the strong, fibrous cords that connect the vertebrae and support and surround the disks (ligaments) start to weaken. A strain on the back may cause a break in the disk ligaments.  RISK FACTORS  Herniated disks occur most often in men who are aged 18 years to 35 years, usually after strenuous activity. Other risk factors include conditions present at birth (congenital) that affect the size of the lumbar spinal canal. Additionally, a narrowing of the areas where the nerves exit the spinal canal can occur as you age.  SYMPTOMS  Symptoms of a herniated disk vary. You may have weakness in certain muscles. This weakness can include difficulty lifting your leg or arm, difficulty standing on your toes on one side, or difficulty squeezing tightly with one of your hands. You may have numbness. You may feel a mild tingling, dull ache, or a burning or pulsating pain.  In some cases, the pain is severe enough that you are unable to move. The pain most often occurs on one side of the body. The pain often starts slowly. It may get worse:  After you sit or stand.  At night.  When you sneeze, cough, or laugh.  When you bend backwards or walk more than a few yards.  The pain, numbness, or weakness will often go away or improve a lot over a period of weeks to months.  Herniated lumbar disk  Symptoms of a herniated lumbar disk may include sharp pain in one part of your leg, hip, or buttocks and  numbness in other parts. You also may feel pain or numbness on the back of your calf or the top or sole of your foot. The same leg also may feel weak.  Herniated cervical disk  Symptoms of a herniated cervical disk may include pain when you move your neck, deep pain near or over your shoulder blade, or pain that moves to your upper arm, forearm, or fingers.  DIAGNOSIS  To diagnose a herniated disk, your caregiver will perform a physical exam. Your caregiver also may perform diagnostic tests to see your disk or to test the reaction of your muscles and the function of your nerves.  During the physical exam, your caregiver may ask you to:  Sit, stand, and walk. While you walk, your caregiver may ask you to try walking on your toes and then your heels.  Bend forward, backward, and sideways.  Raise your shoulders, elbow, wrist, and fingers and  check your strength during these tasks.  Your caregiver will check for:  Numbness or loss of feeling.  Muscle reflexes, which may be slower or missing.  Muscle strength, which may be weaker.  Posture or the way your spine curves.  Diagnostic tests that may be done include:  A spinal X-ray exam to rule out other causes of back pain.  Magnetic resonance imaging (MRI) or computed tomography (CT) scan, which will show if the herniated disk is pressing on your spinal canal.  Electromyography. This is sometimes used to identify the specific area of nerve involvement.  TREATMENT  Initial treatment for a herniated disk is a short period of rest with medicines for pain. Pain medicines can include nonsteroidal anti-inflammatory medicines (NSAIDs), muscle relaxants for back spasms, and (rarely) narcotic pain medicine for severe pain that does not respond to NSAID use. Bed rest is often limited to 1 or 2 days at the most because prolonged rest can delay recovery. When the herniation involves the lower back, sitting should be avoided as much as possible because sitting  increases pressure on the ruptured disk. Sometimes a soft neck collar will be prescribed for a few days to weeks to help support your neck in the case of a cervical herniation.  Physical therapy is often prescribed for patients with disk disease. Physical therapists will teach you how to properly lift, dress, walk, and perform other activities. They will work on strengthening the muscles that help support your spine.  In some cases, physical therapy alone is not enough to treat a herniated disk. Steroid injections along the involved nerve root may be needed to help control pain. The steroid is injected in the area of the herniated disk and helps by reducing swelling around the disk. Sometimes surgery is the best option to treat a herniated disk.  SEEK IMMEDIATE MEDICAL CARE IF:  You have numbness, tingling, weakness, or problems with the use of your arms or legs.  You have severe headaches that are not relieved with the use of medicines.  You notice a change in your bowel or bladder control.  You have increasing pain in any areas of your body.  You experience shortness of breath, dizziness, or fainting.  MAKE SURE YOU:  Understand these instructions.  Will watch your condition.  Will get help right away if you are not doing well or get worse.

## 2012-03-14 NOTE — Progress Notes (Signed)
  Subjective:    Patient ID: Daniel Perkins, male    DOB: 1956-03-12, 56 y.o.   MRN: [id number]  CC: back pain  HPI:  This is a 56 year old man with hypertension, hyperlipidemia and diabetes presenting to clinic with back pain.  He has been seen in the ED and at urgent care within the past two weeks.  Onset was about two weeks ago.  He says he was helping his mother in and out of bed by lifting her and he thinks he injured his back then since that was the day it started hurting.  There is also mention of an awkward exit from a truck that may have resulted in a back injury in the urgent care notes.  He never experienced a pop or tear.  Quality is described as "sharp."  The pain originates in his lower back midline and radiates to the left through his buttocks and down the lateral aspect of his thigh and leg.  Severity is 10/10 with movement, but when he is sitting in a comfortable position, it is 5/10.  The pain is constant.  It is not associated with numbness, paraesthesias, weakness, saddle paraesthesias, or incontinence.  Movement aggravates the pain.  Nothing has alleviated the pain.  He has been prescribed and takes hydrocodone-acetaminophen 5/325 2 tabs q4hr PRN, ibuprofen 800mg  TID PRN, and cyclobenzaprine 5mg  TID PRN.  These do not effect the pain level.  He has also recently been seen for dyspnea with exertion.  This has steadily worsened.  Now he becomes dyspneic with even walking 10 feet or so.  It is associated with lightheadedness and a burning sensation that is located over the right precordium.  It is not associated with diaphoresis, palpitations, or nausea.  A 48 hour heart monitor report recently mentions rare PVCs but no sustained arrhythmias.  He has an appointment with cardiology on November 14.    Review of Systems  Constitutional: Negative for diaphoresis.  Respiratory: Positive for shortness of breath. Negative for cough.   Cardiovascular: Positive for chest pain. Negative for  palpitations and leg swelling.  Musculoskeletal: Positive for back pain.  Neurological: Positive for light-headedness. Negative for weakness and numbness.       Objective:   Physical Exam GENERAL: obese; no acute distress LUNGS: clear to auscultation bilaterally, normal work of breathing HEART: normal rate and regular rhythm; normal S1 and S2 without S3 or S4; no murmurs, rubs, or clicks Physical Exam  Musculoskeletal:       Cervical back: Normal.       Thoracic back: Normal.       Lumbar back: He exhibits bony tenderness. He exhibits no swelling, no edema, no deformity and no spasm.       Back:      He has full range of motion of the hips, knees, and ankles, though leg extension and hip flexion on the left exacerbated the back pain. MOTOR:  5/5 and symmetric: dorsiflexion, plantarflexion, leg extension, leg flexion, hip flexion SENSATION: intact in the feet and legs bilaterally REFLEXES: 2+ and symmetric patellar reflexes PULSES: radial and dorsalis pedis pulses 2+ and symmetric SKIN: warm, dry, intact   Filed Vitals:   03/14/12 0834  BP: 135/95  Pulse: 112  Temp: 97 F (36.1 C)              Assessment & Plan:

## 2012-03-14 NOTE — Assessment & Plan Note (Signed)
EKG and Holter monitor are reassuring regarding unstable angina and ACS. Patient had normal SPECT imaging in 2012 and a heart catheterization in 2011 that demonstrated non-obstructive CAD.  Mild heart failure may be the cause, but he does not reports symptoms of edema, orthopnea, or PND.  PFTs from 2012 were normal: 99% FVC, 101% FEV1, 100% FEV1:FVC ratio; so COPD is unlikely.  Patient will see Dr. Eden Emms with cardiology on November 14. - follow up with cardiology

## 2012-03-14 NOTE — Assessment & Plan Note (Addendum)
The history and physical are consistent with a lumbar disc herniation and paraspinal muscle strain/spasm.  The radiation of pain along the distribution of the sciatic nerve is very suggestive of impingement of the sciatic nerve root L4 or maybe L5.  The tenderness with palpation of the paraspinal musculature supports a localized muscular injury in the lumbar back. There are no warning signs that would raise concern for myelopathy.  At only 2 weeks out from the initial injury, conservative medical management has not been given enough time to be effective.  Will will advance his pain therapy by increasing the potency of narcotic medication, continue the muscle relaxant and anti-inflammatory medicines, and add gabapentin for neuromodulation of pain.  We will also refer him to outpatient physical therapy to begin stretching and strengthening exercises.  If the pain is not better in 1 month, consideration should be given to a sports medicine or physiatry referral. In clinic today, we also administered 15mg  of ketorolac IM for pain relief.  - ketorolac 15mg  IM once in clinic today - continue ibuprofen 800mg  TID for 1 week and then TID PRN - continue cyclobenzaprine 5mg  TID for 1 week and then TID PRN, rx refilled - stop hydrocodone-acetaminophen - start oxycodone-acetaminophen 5/325, 1-2 tabs q4hr PRN, #30 - start gabapentin 300mg  TID (start QD, then BID, then TID by day 3), #90 - referral to PT - consider sports med or physiatry referral if pain does not improve.

## 2012-03-21 ENCOUNTER — Ambulatory Visit: Payer: Medicare Other | Attending: Internal Medicine

## 2012-03-21 DIAGNOSIS — M2560 Stiffness of unspecified joint, not elsewhere classified: Secondary | ICD-10-CM | POA: Insufficient documentation

## 2012-03-21 DIAGNOSIS — R262 Difficulty in walking, not elsewhere classified: Secondary | ICD-10-CM | POA: Insufficient documentation

## 2012-03-21 DIAGNOSIS — IMO0001 Reserved for inherently not codable concepts without codable children: Secondary | ICD-10-CM | POA: Insufficient documentation

## 2012-03-21 DIAGNOSIS — M2550 Pain in unspecified joint: Secondary | ICD-10-CM | POA: Insufficient documentation

## 2012-03-22 ENCOUNTER — Ambulatory Visit (INDEPENDENT_AMBULATORY_CARE_PROVIDER_SITE_OTHER): Payer: Medicare Other | Admitting: Cardiovascular Disease

## 2012-03-22 ENCOUNTER — Encounter: Payer: Self-pay | Admitting: Cardiovascular Disease

## 2012-03-22 VITALS — BP 152/95 | HR 107 | Ht 68.0 in | Wt 244.2 lb

## 2012-03-22 DIAGNOSIS — I10 Essential (primary) hypertension: Secondary | ICD-10-CM

## 2012-03-22 DIAGNOSIS — E785 Hyperlipidemia, unspecified: Secondary | ICD-10-CM

## 2012-03-22 DIAGNOSIS — R0600 Dyspnea, unspecified: Secondary | ICD-10-CM

## 2012-03-22 DIAGNOSIS — R0989 Other specified symptoms and signs involving the circulatory and respiratory systems: Secondary | ICD-10-CM

## 2012-03-22 DIAGNOSIS — R0609 Other forms of dyspnea: Secondary | ICD-10-CM

## 2012-03-22 DIAGNOSIS — E119 Type 2 diabetes mellitus without complications: Secondary | ICD-10-CM

## 2012-03-22 NOTE — Assessment & Plan Note (Signed)
Discussed low carb diet.  Target hemoglobin A1c is 6.5 or less.  Continue current medications.  

## 2012-03-22 NOTE — Patient Instructions (Addendum)
Your physician recommends that you schedule a follow-up appointment  With Dr. Clifton James after cardiopulmonary stess test  Your physician recommends you have a cardiopulmonary stress test.  This is done at [facility]

## 2012-03-22 NOTE — Assessment & Plan Note (Signed)
Would not appear to be cardiac in nature. F/U cardiopulmonary stress test.  Discussed exercise with indoor bike and walking Also discussed weight loss and low carb diet

## 2012-03-22 NOTE — Assessment & Plan Note (Signed)
Cholesterol is at goal.  Continue current dose of statin and diet Rx.  No myalgias or side effects.  F/U  LFT's in 6 months. Lab Results  Component Value Date   LDLCALC 154* 01/16/2012   Still needs titration of statin F/U IM

## 2012-03-22 NOTE — Progress Notes (Signed)
Patient ID: Daniel Perkins, male   DOB: 17-Mar-1956, 56 y.o.   MRN: [id number] 56 yo referred by [facility] for dyspnea. CRF;s DM, HTN and elevated lipids  He is actually a patient of Dr Alice Reichert.  Last seen 4/12  Has had cath in 2011 with no significant CAD  Echo done 4/12  Normal EF  Study Conclusions  - Left ventricle: There may be some diastolic dysfunction. There is upper septal thickening, but no SAM and no LVOT gradient. The cavity size was normal. Wall thickness was increased in a pattern of mild LVH. The estimated ejection fraction was 65%. Wall motion was normal; there were no regional wall motion abnormalities. - Aortic valve: Mild regurgitation.  Myovue 08/11/10  EF 61% no ischemia  Event monitor  10/23  SR with rare PVC;s   More dyspnea last 3 months.  No wheezing quit smoking 3.5 years ago. CXR 2012 NAD  Cannot find PFT;s  No PND or orthopnea  Mild chronic dependant edema  ROS: Denies fever, malais, weight loss, blurry vision, decreased visual acuity, cough, sputum, hemoptysis, pleuritic pain, palpitaitons, heartburn, abdominal pain, melena, lower extremity edema, claudication, or rash.  All other systems reviewed and negative  General: Affect appropriate Obese black  HEENT: normal Neck supple with no adenopathy JVP normal no bruits no thyromegaly Lungs clear with no wheezing and good diaphragmatic motion Heart:  S1/S2 no murmur, no rub, gallop or click PMI normal Abdomen: benighn, BS positve, no tenderness, no AAA no bruit.  No HSM or HJR Distal pulses intact with no bruits No edema Neuro non-focal Skin warm and dry No muscular weakness   Current Outpatient Prescriptions  Medication Sig Dispense Refill  . cyclobenzaprine (FLEXERIL) 5 MG tablet Take 1 tablet (5 mg total) by mouth 3 (three) times daily as needed for muscle spasms.  30 tablet  0  . gabapentin (NEURONTIN) 300 MG capsule Take 1 capsule (300 mg total) by mouth 3 (three) times daily. Start: take once on  first day, twice on second day, and then 3 times daily  90 capsule  0  . ibuprofen (ADVIL,MOTRIN) 800 MG tablet Take 1 tablet (800 mg total) by mouth 3 (three) times daily.  60 tablet  0  . lisinopril-hydrochlorothiazide (PRINZIDE,ZESTORETIC) 10-12.5 MG per tablet Take 1 tablet by mouth daily.      . metFORMIN (GLUCOPHAGE) 1000 MG tablet Take 1,000 mg by mouth 2 (two) times daily with a meal.      . oxyCODONE-acetaminophen (ROXICET) 5-325 MG per tablet Take 1-2 tablets by mouth every 4 (four) hours as needed for pain.  30 tablet  0  . tadalafil (CIALIS) 20 MG tablet Take 1/2 tab (10 mg) 30 minutes before intercourse.  3 tablet  0    Allergies  Morphine  Electrocardiogram: 10/14  SR rate 81 early reopol otherwise normal  Assessment and Plan

## 2012-03-22 NOTE — Assessment & Plan Note (Signed)
Well controlled.  Continue current medications and low sodium Dash type diet.    

## 2012-03-26 ENCOUNTER — Encounter: Payer: Medicare Other | Admitting: Dietician

## 2012-03-26 ENCOUNTER — Ambulatory Visit: Payer: Medicare Other | Admitting: Internal Medicine

## 2012-03-26 ENCOUNTER — Encounter: Payer: Medicare Other | Admitting: Internal Medicine

## 2012-03-26 NOTE — Addendum Note (Signed)
Addended by: Neomia Dear on: 03/26/2012 06:04 PM   Modules accepted: Orders

## 2012-03-27 ENCOUNTER — Ambulatory Visit: Payer: Medicare Other | Admitting: Physical Therapy

## 2012-03-28 ENCOUNTER — Encounter: Payer: Self-pay | Admitting: Internal Medicine

## 2012-03-28 ENCOUNTER — Telehealth: Payer: Self-pay | Admitting: Internal Medicine

## 2012-03-28 ENCOUNTER — Other Ambulatory Visit: Payer: Self-pay | Admitting: Internal Medicine

## 2012-03-28 MED ORDER — LISINOPRIL-HYDROCHLOROTHIAZIDE 10-12.5 MG PO TABS: 2.0000 | ORAL_TABLET | Freq: Every day | ORAL | Status: DC

## 2012-03-28 NOTE — Telephone Encounter (Signed)
I talked to Daniel Perkins introduced myself as her new PCP from Dr Eben Burow. I have instructed him to increased the dose of Lisinopril/HCTZ 20/12.5 to 40/25 and make an appointment in 1 month with me.

## 2012-03-29 ENCOUNTER — Encounter: Payer: Medicare Other | Admitting: Physical Therapy

## 2012-04-02 ENCOUNTER — Encounter: Payer: Medicare Other | Admitting: Physical Therapy

## 2012-04-04 ENCOUNTER — Encounter: Payer: Medicare Other | Admitting: Physical Therapy

## 2012-04-10 ENCOUNTER — Ambulatory Visit (HOSPITAL_COMMUNITY): Payer: Medicare Other

## 2012-04-18 ENCOUNTER — Ambulatory Visit (HOSPITAL_COMMUNITY): Payer: Medicare Other | Attending: Cardiovascular Disease

## 2012-04-18 DIAGNOSIS — R0602 Shortness of breath: Secondary | ICD-10-CM

## 2012-04-18 DIAGNOSIS — R0609 Other forms of dyspnea: Secondary | ICD-10-CM | POA: Insufficient documentation

## 2012-04-18 DIAGNOSIS — R0989 Other specified symptoms and signs involving the circulatory and respiratory systems: Secondary | ICD-10-CM | POA: Insufficient documentation

## 2012-04-18 DIAGNOSIS — R0600 Dyspnea, unspecified: Secondary | ICD-10-CM

## 2012-05-10 ENCOUNTER — Encounter: Payer: Self-pay | Admitting: Cardiovascular Disease

## 2012-05-10 ENCOUNTER — Ambulatory Visit (INDEPENDENT_AMBULATORY_CARE_PROVIDER_SITE_OTHER): Payer: Medicare Other | Admitting: Cardiovascular Disease

## 2012-05-10 VITALS — BP 150/97 | HR 103 | Ht 68.0 in | Wt 243.0 lb

## 2012-05-10 DIAGNOSIS — N529 Male erectile dysfunction, unspecified: Secondary | ICD-10-CM

## 2012-05-10 DIAGNOSIS — I2510 Atherosclerotic heart disease of native coronary artery without angina pectoris: Secondary | ICD-10-CM

## 2012-05-10 DIAGNOSIS — I10 Essential (primary) hypertension: Secondary | ICD-10-CM

## 2012-05-10 MED ORDER — SILDENAFIL CITRATE 50 MG PO TABS: 50.0000 mg | ORAL_TABLET | Freq: Every day | ORAL | Status: DC | PRN

## 2012-05-10 MED ORDER — METOPROLOL TARTRATE 25 MG PO TABS: 25.0000 mg | ORAL_TABLET | Freq: Two times a day (BID) | ORAL | Status: DC

## 2012-05-10 NOTE — Progress Notes (Signed)
History of Present Illness: 57 yo AAM with history of mild non-obstructive CAD, DM, Hyperlipidemia, HTN, Gout who is here todayfor follow up.  He had a cardiac cath in April 2011 with mild 20% stenosis in the obtuse marginal but no other disease. His echo showed diastolic dysfunction with normal LV systolic function, mild AI. His stress myoview April 2012 with no ischemia. He was seen here in our office 03/22/12 by Dr. Eden Emms. C/O dyspnea. Cardiopulmonary stress test with hypertensive response to exercise.    He has been feeling well. No chest pain with exertion. He does not smoke, quit 2008. He continues to have dyspnea with exertion.   Primary Care Physician: Dow Adolph  Last Lipid Profile:Lipid Panel     Component Value Date/Time   CHOL 219* 01/16/2012 0913   TRIG 148 01/16/2012 0913   HDL 35* 01/16/2012 0913   CHOLHDL 6.3 01/16/2012 0913   VLDL 30 01/16/2012 0913   LDLCALC 154* 01/16/2012 0913     Past Medical History  Diagnosis Date  . Coronary artery disease     Holter monitor 02/2012 - sinus with rare PVC  . Gout   . Hyperlipidemia   . Hypertension   . Osteomyelitis   . CTEV (congenital talipes equinovarus)   . Diabetes mellitus without complication     Past Surgical History  Procedure Date  . Neck surgery   . Foot surgery     Current Outpatient Prescriptions  Medication Sig Dispense Refill  . cyclobenzaprine (FLEXERIL) 5 MG tablet Take 1 tablet (5 mg total) by mouth 3 (three) times daily as needed for muscle spasms.  30 tablet  0  . ibuprofen (ADVIL,MOTRIN) 800 MG tablet Take 1 tablet (800 mg total) by mouth 3 (three) times daily.  60 tablet  0  . lisinopril-hydrochlorothiazide (PRINZIDE,ZESTORETIC) 10-12.5 MG per tablet Take 2 tablets by mouth daily.  30 tablet  1  . metFORMIN (GLUCOPHAGE) 1000 MG tablet Take 1,000 mg by mouth 2 (two) times daily with a meal.      . oxyCODONE-acetaminophen (ROXICET) 5-325 MG per tablet Take 1-2 tablets by mouth every 4 (four) hours as  needed for pain.  30 tablet  0  . pravastatin (PRAVACHOL) 20 MG tablet Take 20 mg by mouth daily.      . tadalafil (CIALIS) 20 MG tablet Take 1/2 tab (10 mg) 30 minutes before intercourse.  3 tablet  0    Allergies  Allergen Reactions  . Morphine Itching    History   Social History  . Marital Status: Married    Spouse Name: N/A    Number of Children: N/A  . Years of Education: N/A   Occupational History  . Not on file.   Social History Main Topics  . Smoking status: Former Smoker    Quit date: 07/06/2007  . Smokeless tobacco: Not on file  . Alcohol Use: Yes  . Drug Use: Not on file  . Sexually Active: Not on file   Other Topics Concern  . Not on file   Social History Narrative  . No narrative on file    Family History  Problem Relation Age of Onset  . Diabetes Mother   . Coronary artery disease Mother     HAS PACEMAKER  . Heart disease Mother   . Heart attack Brother     Review of Systems:  As stated in the HPI and otherwise negative.   BP 150/97  Pulse 103  Ht 5\' 8"  (1.727 m)  Wt 243 lb (110.224 kg)  BMI 36.95 kg/m2  Physical Examination: General: Well developed, well nourished, NAD HEENT: OP clear, mucus membranes moist SKIN: warm, dry. No rashes. Neuro: No focal deficits Musculoskeletal: Muscle strength 5/5 all ext Psychiatric: Mood and affect normal Neck: No JVD, no carotid bruits, no thyromegaly, no lymphadenopathy. Lungs:Clear bilaterally, no wheezes, rhonci, crackles Cardiovascular: Regular rate and rhythm. No murmurs, gallops or rubs. Abdomen:Soft. Bowel sounds present. Non-tender.  Extremities: No lower extremity edema. Pulses are 2 + in the bilateral DP/PT.  Assessment and Plan:   1. CAD: Mild by cath 2011. Continue medical management  2. ED: Start Viagra  3. HTN: Will add Lopressor 25 mg po BID. Continue Lisinopril/HCTZ

## 2012-05-10 NOTE — Patient Instructions (Signed)
Your physician wants you to follow-up in:  2 months. You will receive a reminder letter in the mail two months in advance. If you don't receive a letter, please call our office to schedule the follow-up appointment.  Your physician has recommended you make the following change in your medication:  Start lopressor 25 mg by mouth twice daily.

## 2012-05-16 ENCOUNTER — Ambulatory Visit: Payer: Medicare Other | Admitting: Internal Medicine

## 2012-05-17 ENCOUNTER — Emergency Department (INDEPENDENT_AMBULATORY_CARE_PROVIDER_SITE_OTHER)
Admission: EM | Admit: 2012-05-17 | Discharge: 2012-05-17 | Disposition: A | Discharge status: Home / Self Care | Payer: Medicare Other | Source: Home / Self Care | Attending: Emergency Medicine | Admitting: Emergency Medicine

## 2012-05-17 ENCOUNTER — Encounter (HOSPITAL_COMMUNITY): Payer: Self-pay | Admitting: Emergency Medicine

## 2012-05-17 DIAGNOSIS — M79671 Pain in right foot: Secondary | ICD-10-CM

## 2012-05-17 DIAGNOSIS — M79609 Pain in unspecified limb: Secondary | ICD-10-CM

## 2012-05-17 MED ORDER — HYDROCODONE-ACETAMINOPHEN 5-325 MG PO TABS: 2.0000 | ORAL_TABLET | ORAL | Status: DC | PRN

## 2012-05-17 MED ORDER — KETOROLAC TROMETHAMINE 30 MG/ML IJ SOLN
INTRAMUSCULAR | Status: AC
  Filled 2012-05-17: qty 1

## 2012-05-17 MED ORDER — KETOROLAC TROMETHAMINE 30 MG/ML IJ SOLN
30.0000 mg | Freq: Once | INTRAMUSCULAR | Status: AC
  Administered 2012-05-17: 30 mg via INTRAMUSCULAR

## 2012-05-17 NOTE — ED Provider Notes (Signed)
History     CSN: [id number]  Arrival date & time 05/17/12  1715   First MD Initiated Contact with Patient 05/17/12 1724      Chief Complaint  Patient presents with  . Foot Pain    (Consider location/radiation/quality/duration/timing/severity/associated sxs/prior treatment) HPI Comments: Patient presents this evening to urgent care complaining of right foot pain for the last 3 days. He describes no injuries, no falls or recently increased physical activity. Pain does increase when he walks on it or when he moves his foot. Pain is located in the dorsal aspect of his right foot. He denies any swelling, redness, or new shoe wear. Once in a while he gets a flareup of his pain as he has had surgery of his right foot in the past. Patient denies any numbness tingling sensation. " Perhaps weather is making my foot hurt"..  Patient is a 56 y.o. male presenting with lower extremity pain. The history is provided by the patient.  Foot Pain This is a new problem. The current episode started 2 days ago. The problem occurs constantly. The problem has been gradually worsening. The symptoms are aggravated by walking. Nothing relieves the symptoms. He has tried nothing for the symptoms.    Past Medical History  Diagnosis Date  . Coronary artery disease     Holter monitor 02/2012 - sinus with rare PVC  . Gout   . Hyperlipidemia   . Hypertension   . Osteomyelitis   . CTEV (congenital talipes equinovarus)   . Diabetes mellitus without complication     Past Surgical History  Procedure Date  . Neck surgery   . Foot surgery     Family History  Problem Relation Age of Onset  . Diabetes Mother   . Coronary artery disease Mother     HAS PACEMAKER  . Heart disease Mother   . Heart attack Brother     History  Substance Use Topics  . Smoking status: Former Smoker    Quit date: 07/06/2007  . Smokeless tobacco: Not on file  . Alcohol Use: Yes      Review of Systems  Constitutional:  Positive for activity change. Negative for fever, chills, diaphoresis, appetite change, fatigue and unexpected weight change.  Skin: Negative for color change, pallor, rash and wound.    Allergies  Morphine  Home Medications   Current Outpatient Rx  Name  Route  Sig  Dispense  Refill  . LISINOPRIL-HYDROCHLOROTHIAZIDE 10-12.5 MG PO TABS   Oral   Take 2 tablets by mouth daily.   30 tablet   1   . METFORMIN HCL 1000 MG PO TABS   Oral   Take 1,000 mg by mouth 2 (two) times daily with a meal.         . METOPROLOL TARTRATE 25 MG PO TABS   Oral   Take 1 tablet (25 mg total) by mouth 2 (two) times daily.   60 tablet   11   . PRAVASTATIN SODIUM 20 MG PO TABS   Oral   Take 20 mg by mouth daily.         . CYCLOBENZAPRINE HCL 5 MG PO TABS   Oral   Take 1 tablet (5 mg total) by mouth 3 (three) times daily as needed for muscle spasms.   30 tablet   0   . HYDROCODONE-ACETAMINOPHEN 5-325 MG PO TABS   Oral   Take 2 tablets by mouth every 4 (four) hours as needed for pain.   10  tablet   0   . IBUPROFEN 800 MG PO TABS   Oral   Take 1 tablet (800 mg total) by mouth 3 (three) times daily.   60 tablet   0   . SILDENAFIL CITRATE 50 MG PO TABS   Oral   Take 1 tablet (50 mg total) by mouth daily as needed for erectile dysfunction.   5 tablet   2     BP 95/65  Pulse 101  Temp 98.4 F (36.9 C) (Oral)  Resp 16  SpO2 100%  Physical Exam  Vitals reviewed. Constitutional: Vital signs are normal. He appears well-developed and well-nourished.  Non-toxic appearance. He does not have a sickly appearance. He does not appear ill. No distress.  Pulmonary/Chest: Effort normal.  Abdominal: Soft.  Musculoskeletal:       Feet:  Skin: Skin is warm. No rash noted. No erythema.    ED Course  Procedures (including critical care time)  Labs Reviewed - No data to display No results found.   1. Foot pain, right       MDM  Exacerbation of chronic foot pain. Have encouraged  patient to followup with orthopedic doctor have been prescribed a shot of Toradol IM while at urgent care and to provide for 10 tablets of Lortab. Patient agrees to followup with Dr. due to if pain was to persist. Given the patient has had no recent injuries or changes in physical activity don't have any suspicion of a new lead develop a fracture or a hardware malfunction. Have instructed patient that if pain persists further evaluation needs to take place including an x-ray. He is agreeable and we'll call Dr. Lajoyce Corners to if he needs to.        Jimmie Molly, MD 05/17/12 2014

## 2012-05-17 NOTE — ED Notes (Signed)
Pt c/o right foot pain x3 days. Pain increases w/activity and pressure.  Pain is constant to the point that he's become sensitive to the sock Denies: inj/trauma to site Had surgery on right foot x7 years ago and has a metal plate?? Hx of gout   He is alert w/no signs of acute distress.

## 2012-05-29 ENCOUNTER — Encounter (HOSPITAL_COMMUNITY): Payer: Self-pay

## 2012-05-29 ENCOUNTER — Emergency Department (HOSPITAL_COMMUNITY)
Admission: EM | Admit: 2012-05-29 | Discharge: 2012-05-29 | Disposition: A | Discharge status: Home / Self Care | Payer: Medicare Other | Attending: Emergency Medicine | Admitting: Emergency Medicine

## 2012-05-29 ENCOUNTER — Emergency Department (HOSPITAL_COMMUNITY): Payer: Medicare Other

## 2012-05-29 DIAGNOSIS — Z79899 Other long term (current) drug therapy: Secondary | ICD-10-CM | POA: Insufficient documentation

## 2012-05-29 DIAGNOSIS — J3489 Other specified disorders of nose and nasal sinuses: Secondary | ICD-10-CM | POA: Insufficient documentation

## 2012-05-29 DIAGNOSIS — R42 Dizziness and giddiness: Secondary | ICD-10-CM | POA: Insufficient documentation

## 2012-05-29 DIAGNOSIS — E119 Type 2 diabetes mellitus without complications: Secondary | ICD-10-CM | POA: Insufficient documentation

## 2012-05-29 DIAGNOSIS — Z8739 Personal history of other diseases of the musculoskeletal system and connective tissue: Secondary | ICD-10-CM | POA: Insufficient documentation

## 2012-05-29 DIAGNOSIS — Z862 Personal history of diseases of the blood and blood-forming organs and certain disorders involving the immune mechanism: Secondary | ICD-10-CM | POA: Insufficient documentation

## 2012-05-29 DIAGNOSIS — Z87891 Personal history of nicotine dependence: Secondary | ICD-10-CM | POA: Insufficient documentation

## 2012-05-29 DIAGNOSIS — Z8639 Personal history of other endocrine, nutritional and metabolic disease: Secondary | ICD-10-CM | POA: Insufficient documentation

## 2012-05-29 DIAGNOSIS — M545 Low back pain, unspecified: Secondary | ICD-10-CM | POA: Insufficient documentation

## 2012-05-29 DIAGNOSIS — I2510 Atherosclerotic heart disease of native coronary artery without angina pectoris: Secondary | ICD-10-CM | POA: Insufficient documentation

## 2012-05-29 DIAGNOSIS — J329 Chronic sinusitis, unspecified: Secondary | ICD-10-CM | POA: Insufficient documentation

## 2012-05-29 DIAGNOSIS — R6883 Chills (without fever): Secondary | ICD-10-CM | POA: Insufficient documentation

## 2012-05-29 DIAGNOSIS — J40 Bronchitis, not specified as acute or chronic: Secondary | ICD-10-CM

## 2012-05-29 DIAGNOSIS — J209 Acute bronchitis, unspecified: Secondary | ICD-10-CM | POA: Insufficient documentation

## 2012-05-29 DIAGNOSIS — E785 Hyperlipidemia, unspecified: Secondary | ICD-10-CM | POA: Insufficient documentation

## 2012-05-29 DIAGNOSIS — I10 Essential (primary) hypertension: Secondary | ICD-10-CM | POA: Insufficient documentation

## 2012-05-29 LAB — CBC WITH DIFFERENTIAL/PLATELET
Basophils Relative: 0 % (ref 0–1)
Eosinophils Relative: 4 % (ref 0–5)
HCT: 40.3 % (ref 39.0–52.0)
Hemoglobin: 13 g/dL (ref 13.0–17.0)
MCHC: 32.3 g/dL (ref 30.0–36.0)
MCV: 69 fL — ABNORMAL LOW (ref 78.0–100.0)
Monocytes Absolute: 0.9 10*3/uL (ref 0.1–1.0)
Monocytes Relative: 13 % — ABNORMAL HIGH (ref 3–12)
Neutro Abs: 3.1 10*3/uL (ref 1.7–7.7)

## 2012-05-29 LAB — BASIC METABOLIC PANEL
BUN: 16 mg/dL (ref 6–23)
Chloride: 99 mEq/L (ref 96–112)
GFR calc non Af Amer: 70 mL/min — ABNORMAL LOW (ref >90)
Glucose, Bld: 105 mg/dL — ABNORMAL HIGH (ref 70–99)
Potassium: 4.7 mEq/L (ref 3.5–5.1)

## 2012-05-29 MED ORDER — AZITHROMYCIN 250 MG PO TABS: 250.0000 mg | ORAL_TABLET | Freq: Every day | ORAL | Status: DC

## 2012-05-29 MED ORDER — OXYCODONE-ACETAMINOPHEN 5-325 MG PO TABS
1.0000 | ORAL_TABLET | Freq: Once | ORAL | Status: AC
  Administered 2012-05-29: 1 via ORAL
  Filled 2012-05-29: qty 1

## 2012-05-29 MED ORDER — OXYCODONE-ACETAMINOPHEN 5-325 MG PO TABS: 1.0000 | ORAL_TABLET | Freq: Four times a day (QID) | ORAL | Status: DC | PRN

## 2012-05-29 NOTE — ED Provider Notes (Signed)
History     CSN: [id number]  Arrival date & time 05/29/12  1432   First MD Initiated Contact with Patient 05/29/12 1757      Chief Complaint  Patient presents with  . Cough  . Back Pain  . Dizziness    (Consider location/radiation/quality/duration/timing/severity/associated sxs/prior treatment) Patient is a 57 y.o. male presenting with cough and back pain. The history is provided by the patient.  Cough This is a new problem. The current episode started more than 1 week ago. The problem occurs constantly. The problem has been gradually worsening. The cough is productive of sputum (yellow sputum). There has been no fever. Associated symptoms include chills and rhinorrhea. Pertinent negatives include no chest pain, no headaches, no myalgias and no wheezing. Associated symptoms comments: Minimal intermittent SOB. He has tried decongestants for the symptoms. The treatment provided no relief. He is not a smoker. His past medical history does not include COPD or asthma.  Back Pain  This is a recurrent problem. The current episode started more than 1 week ago. Episode frequency: intermittent. The problem has been gradually worsening. Associated with: started after he was bending over and lifting something. The pain is present in the lumbar spine. The quality of the pain is described as stabbing and shooting. The pain radiates to the left thigh. The pain is at a severity of 7/10. The pain is moderate. The symptoms are aggravated by bending and twisting. The pain is the same all the time. Pertinent negatives include no chest pain, no fever, no headaches, no abdominal pain, no bowel incontinence, no bladder incontinence, no dysuria, no paresthesias and no weakness. He has tried nothing for the symptoms. The treatment provided no relief.    Past Medical History  Diagnosis Date  . Coronary artery disease     Holter monitor 02/2012 - sinus with rare PVC  . Gout   . Hyperlipidemia   . Hypertension     . Osteomyelitis   . CTEV (congenital talipes equinovarus)   . Diabetes mellitus without complication     Past Surgical History  Procedure Date  . Neck surgery   . Foot surgery     Family History  Problem Relation Age of Onset  . Diabetes Mother   . Coronary artery disease Mother     HAS PACEMAKER  . Heart disease Mother   . Heart attack Brother     History  Substance Use Topics  . Smoking status: Former Smoker    Quit date: 07/06/2007  . Smokeless tobacco: Never Used  . Alcohol Use: No      Review of Systems  Constitutional: Positive for chills. Negative for fever.  HENT: Positive for rhinorrhea.   Respiratory: Positive for cough. Negative for wheezing.   Cardiovascular: Negative for chest pain.  Gastrointestinal: Negative for abdominal pain and bowel incontinence.  Genitourinary: Negative for bladder incontinence and dysuria.  Musculoskeletal: Positive for back pain. Negative for myalgias.  Neurological: Negative for weakness, headaches and paresthesias.  All other systems reviewed and are negative.    Allergies  Morphine  Home Medications   Current Outpatient Rx  Name  Route  Sig  Dispense  Refill  . CYCLOBENZAPRINE HCL 5 MG PO TABS   Oral   Take 5 mg by mouth 3 (three) times daily as needed. Muscle spasms         . GABAPENTIN 300 MG PO CAPS   Oral   Take 300 mg by mouth 3 (three) times daily. Pain         .  LISINOPRIL-HYDROCHLOROTHIAZIDE 10-12.5 MG PO TABS   Oral   Take 2 tablets by mouth daily.   30 tablet   1   . METFORMIN HCL 1000 MG PO TABS   Oral   Take 1,000 mg by mouth 2 (two) times daily with a meal.         . METOPROLOL TARTRATE 25 MG PO TABS   Oral   Take 1 tablet (25 mg total) by mouth 2 (two) times daily.   60 tablet   11   . PRAVASTATIN SODIUM 20 MG PO TABS   Oral   Take 20 mg by mouth daily.         Marland Kitchen SILDENAFIL CITRATE 50 MG PO TABS   Oral   Take 1 tablet (50 mg total) by mouth daily as needed for erectile  dysfunction.   5 tablet   2     BP 117/65  Pulse 110  Temp 98.4 F (36.9 C)  Resp 18  SpO2 98%  Physical Exam  Nursing note and vitals reviewed. Constitutional: He is oriented to person, place, and time. He appears well-developed and well-nourished. No distress.  HENT:  Head: Normocephalic and atraumatic.  Right Ear: Tympanic membrane and ear canal normal.  Left Ear: Tympanic membrane and ear canal normal.  Nose: Mucosal edema and rhinorrhea present. Right sinus exhibits no maxillary sinus tenderness and no frontal sinus tenderness. Left sinus exhibits no maxillary sinus tenderness and no frontal sinus tenderness.  Mouth/Throat: Oropharynx is clear and moist and mucous membranes are normal.       Bloody nasal secretions in the left nare  Eyes: Conjunctivae normal and EOM are normal. Pupils are equal, round, and reactive to light.  Neck: Normal range of motion. Neck supple.  Cardiovascular: Normal rate, regular rhythm and intact distal pulses.   No murmur heard. Pulmonary/Chest: Effort normal. No respiratory distress. He has no wheezes. He has rhonchi in the right upper field and the right middle field. He has no rales.  Abdominal: Soft. He exhibits no distension. There is no tenderness. There is no rebound and no guarding.  Musculoskeletal: Normal range of motion. He exhibits no edema and no tenderness.  Neurological: He is alert and oriented to person, place, and time.  Skin: Skin is warm and dry. No rash noted. No erythema.  Psychiatric: He has a normal mood and affect. His behavior is normal.    ED Course  Procedures (including critical care time)  Labs Reviewed  CBC WITH DIFFERENTIAL - Abnormal; Notable for the following:    RBC 5.84 (*)     MCV 69.0 (*)     MCH 22.3 (*)     Monocytes Relative 13 (*)     All other components within normal limits  BASIC METABOLIC PANEL - Abnormal; Notable for the following:    Sodium 134 (*)     Glucose, Bld 105 (*)     GFR calc non  Af Amer 70 (*)     GFR calc Af Amer 81 (*)     All other components within normal limits   Dg Chest 2 View  05/29/2012  *RADIOLOGY REPORT*  Clinical Data: Cough for 1 week.  CHEST - 2 VIEW  Comparison: PA and lateral chest 10/27/2010 and 07/31/2009.  Findings: Lungs are clear.  Heart size is normal.  No pneumothorax or pleural effusion.  IMPRESSION: No acute disease.   Original Report Authenticated By: Holley Dexter, M.D.      No diagnosis found.  MDM   Patient with upper respiratory congestion, productive cough and mild shortness of breath. This has been ongoing for approximately one week. He denies any, to assess but states when he blows his nose it is bloody. On exam he has rhonchi in the right upper and middle lobes. He satting 90% on room air and has normal blood pressure but is mildly tachycardic. CBC, BMP, chest x-ray pending.  Labs wnl adn CXR neg.  Pt treated for bronchitis and sinusitis.  Secondly, Pt with gradual onset of back pain suggestive of radiculopathy.  No neurovascular compromise and no incontinence.  Pt has no infectious sx, hx of CA  or other red flags concerning for pathologic back pain.  Pt is able to ambulate but is painful.  Normal strength and reflexes on exam.  Denies trauma. Will give pt pain control and to return for developement of above sx.         Gwyneth Sprout, MD 05/29/12 2023

## 2012-05-29 NOTE — ED Notes (Signed)
Patient c/o productive cough with yellow blood-tinged sputum, sinusitis, low back pain , and dizziness x 6 days

## 2012-05-29 NOTE — ED Notes (Signed)
Discharge instructions reviewed. Rx given x2.  

## 2012-06-08 ENCOUNTER — Encounter (HOSPITAL_COMMUNITY): Payer: Self-pay | Admitting: *Deleted

## 2012-06-08 ENCOUNTER — Emergency Department (INDEPENDENT_AMBULATORY_CARE_PROVIDER_SITE_OTHER)
Admission: EM | Admit: 2012-06-08 | Discharge: 2012-06-08 | Disposition: A | Discharge status: Home / Self Care | Payer: Medicare Other | Source: Home / Self Care | Attending: Family Medicine | Admitting: Family Medicine

## 2012-06-08 DIAGNOSIS — Z79899 Other long term (current) drug therapy: Secondary | ICD-10-CM

## 2012-06-08 DIAGNOSIS — M109 Gout, unspecified: Secondary | ICD-10-CM

## 2012-06-08 DIAGNOSIS — E119 Type 2 diabetes mellitus without complications: Secondary | ICD-10-CM

## 2012-06-08 DIAGNOSIS — G8929 Other chronic pain: Secondary | ICD-10-CM

## 2012-06-08 DIAGNOSIS — I10 Essential (primary) hypertension: Secondary | ICD-10-CM

## 2012-06-08 DIAGNOSIS — M545 Low back pain, unspecified: Secondary | ICD-10-CM

## 2012-06-08 DIAGNOSIS — I2510 Atherosclerotic heart disease of native coronary artery without angina pectoris: Secondary | ICD-10-CM

## 2012-06-08 DIAGNOSIS — N529 Male erectile dysfunction, unspecified: Secondary | ICD-10-CM

## 2012-06-08 DIAGNOSIS — M79609 Pain in unspecified limb: Secondary | ICD-10-CM

## 2012-06-08 MED ORDER — KETOROLAC TROMETHAMINE 30 MG/ML IJ SOLN
30.0000 mg | Freq: Once | INTRAMUSCULAR | Status: AC
  Administered 2012-06-08: 30 mg via INTRAMUSCULAR

## 2012-06-08 MED ORDER — INDOMETHACIN 50 MG PO CAPS: 50.0000 mg | ORAL_CAPSULE | Freq: Two times a day (BID) | ORAL | Status: DC

## 2012-06-08 MED ORDER — KETOROLAC TROMETHAMINE 30 MG/ML IJ SOLN
INTRAMUSCULAR | Status: AC
  Filled 2012-06-08: qty 1

## 2012-06-08 MED ORDER — COLCHICINE 0.6 MG PO TABS: 0.6000 mg | ORAL_TABLET | Freq: Two times a day (BID) | ORAL | Status: DC

## 2012-06-08 NOTE — ED Notes (Signed)
Pt  Reports      Pain  And  Swelling  r   Foot  X  6  Days   Some  Redness  Noted        Pt  denys  Any injury   He       Reports  History  Of  Gout    Takes  Allopurinol

## 2012-06-08 NOTE — ED Provider Notes (Signed)
History     CSN: [id number]  Arrival date & time 06/08/12  1158   First MD Initiated Contact with Patient 06/08/12 1301      Chief Complaint  Patient presents with  . Foot Pain    (Consider location/radiation/quality/duration/timing/severity/associated sxs/prior treatment) Patient is a 57 y.o. male presenting with lower extremity pain. The history is provided by the patient.  Foot Pain This is a new problem. The current episode started more than 2 days ago. The problem has been gradually worsening. Associated symptoms comments: Multiple bilat foot surg, wears foot braces.. Treatments tried: does not take allopurinol daily, no labs in long time.    Past Medical History  Diagnosis Date  . Coronary artery disease     Holter monitor 02/2012 - sinus with rare PVC  . Gout   . Hyperlipidemia   . Hypertension   . Osteomyelitis   . CTEV (congenital talipes equinovarus)   . Diabetes mellitus without complication     Past Surgical History  Procedure Date  . Neck surgery   . Foot surgery     Family History  Problem Relation Age of Onset  . Diabetes Mother   . Coronary artery disease Mother     HAS PACEMAKER  . Heart disease Mother   . Heart attack Brother     History  Substance Use Topics  . Smoking status: Former Smoker    Quit date: 07/06/2007  . Smokeless tobacco: Never Used  . Alcohol Use: No      Review of Systems  Constitutional: Negative.   Musculoskeletal: Positive for myalgias and gait problem.    Allergies  Morphine  Home Medications   Current Outpatient Rx  Name  Route  Sig  Dispense  Refill  . AZITHROMYCIN 250 MG PO TABS   Oral   Take 1 tablet (250 mg total) by mouth daily. Take first 2 tablets together, then 1 every day until finished.   6 tablet   0   . COLCHICINE 0.6 MG PO TABS   Oral   Take 1 tablet (0.6 mg total) by mouth 2 (two) times daily.   30 tablet   0   . CYCLOBENZAPRINE HCL 5 MG PO TABS   Oral   Take 5 mg by mouth 3  (three) times daily as needed. Muscle spasms         . GABAPENTIN 300 MG PO CAPS   Oral   Take 300 mg by mouth 3 (three) times daily. Pain         . INDOMETHACIN 50 MG PO CAPS   Oral   Take 1 capsule (50 mg total) by mouth 2 (two) times daily with a meal.   30 capsule   1   . LISINOPRIL-HYDROCHLOROTHIAZIDE 10-12.5 MG PO TABS   Oral   Take 2 tablets by mouth daily.   30 tablet   1   . METFORMIN HCL 1000 MG PO TABS   Oral   Take 1,000 mg by mouth 2 (two) times daily with a meal.         . METOPROLOL TARTRATE 25 MG PO TABS   Oral   Take 1 tablet (25 mg total) by mouth 2 (two) times daily.   60 tablet   11   . OXYCODONE-ACETAMINOPHEN 5-325 MG PO TABS   Oral   Take 1 tablet by mouth every 6 (six) hours as needed for pain.   20 tablet   0   . PRAVASTATIN SODIUM 20 MG  PO TABS   Oral   Take 20 mg by mouth daily.         Marland Kitchen SILDENAFIL CITRATE 50 MG PO TABS   Oral   Take 1 tablet (50 mg total) by mouth daily as needed for erectile dysfunction.   5 tablet   2     BP 150/98  Pulse 88  Temp 98.6 F (37 C)  Resp 18  SpO2 100%  Physical Exam  Nursing note and vitals reviewed. Constitutional: He is oriented to person, place, and time. He appears well-developed and well-nourished.  Musculoskeletal: He exhibits tenderness.       Feet:  Neurological: He is alert and oriented to person, place, and time.  Skin: Skin is warm and dry. There is erythema.    ED Course  Procedures (including critical care time)   Labs Reviewed  URIC ACID   No results found.   1. Gout flare       MDM          Linna Hoff, MD 06/08/12 1419

## 2012-06-12 ENCOUNTER — Encounter: Payer: Self-pay | Admitting: Internal Medicine

## 2012-06-12 ENCOUNTER — Ambulatory Visit (HOSPITAL_COMMUNITY)
Admission: RE | Admit: 2012-06-12 | Discharge: 2012-06-12 | Disposition: A | Discharge status: Home / Self Care | Payer: Medicare Other | Source: Ambulatory Visit | Attending: Internal Medicine | Admitting: Internal Medicine

## 2012-06-12 ENCOUNTER — Ambulatory Visit (INDEPENDENT_AMBULATORY_CARE_PROVIDER_SITE_OTHER): Payer: Medicare Other | Admitting: Internal Medicine

## 2012-06-12 VITALS — BP 188/111 | HR 97 | Temp 97.0°F | Resp 20 | Ht 66.0 in | Wt 240.0 lb

## 2012-06-12 DIAGNOSIS — I10 Essential (primary) hypertension: Secondary | ICD-10-CM

## 2012-06-12 DIAGNOSIS — M545 Low back pain, unspecified: Secondary | ICD-10-CM

## 2012-06-12 DIAGNOSIS — M109 Gout, unspecified: Secondary | ICD-10-CM

## 2012-06-12 DIAGNOSIS — F528 Other sexual dysfunction not due to a substance or known physiological condition: Secondary | ICD-10-CM

## 2012-06-12 DIAGNOSIS — G8929 Other chronic pain: Secondary | ICD-10-CM

## 2012-06-12 DIAGNOSIS — I2510 Atherosclerotic heart disease of native coronary artery without angina pectoris: Secondary | ICD-10-CM

## 2012-06-12 DIAGNOSIS — E119 Type 2 diabetes mellitus without complications: Secondary | ICD-10-CM

## 2012-06-12 DIAGNOSIS — E669 Obesity, unspecified: Secondary | ICD-10-CM

## 2012-06-12 DIAGNOSIS — S3992XA Unspecified injury of lower back, initial encounter: Secondary | ICD-10-CM

## 2012-06-12 DIAGNOSIS — Z79899 Other long term (current) drug therapy: Secondary | ICD-10-CM

## 2012-06-12 DIAGNOSIS — N529 Male erectile dysfunction, unspecified: Secondary | ICD-10-CM

## 2012-06-12 DIAGNOSIS — M79609 Pain in unspecified limb: Secondary | ICD-10-CM

## 2012-06-12 DIAGNOSIS — E66812 Obesity, class 2: Secondary | ICD-10-CM

## 2012-06-12 DIAGNOSIS — Z981 Arthrodesis status: Secondary | ICD-10-CM | POA: Insufficient documentation

## 2012-06-12 DIAGNOSIS — M898X9 Other specified disorders of bone, unspecified site: Secondary | ICD-10-CM | POA: Insufficient documentation

## 2012-06-12 LAB — GLUCOSE, CAPILLARY: Glucose-Capillary: 187 mg/dL — ABNORMAL HIGH (ref 70–99)

## 2012-06-12 LAB — CBC
HCT: 38.1 % — ABNORMAL LOW (ref 39.0–52.0)
Hemoglobin: 12.1 g/dL — ABNORMAL LOW (ref 13.0–17.0)
WBC: 6.5 10*3/uL (ref 4.0–10.5)

## 2012-06-12 MED ORDER — CYCLOBENZAPRINE HCL 5 MG PO TABS: 5.0000 mg | ORAL_TABLET | Freq: Three times a day (TID) | ORAL | Status: DC | PRN

## 2012-06-12 MED ORDER — LISINOPRIL-HYDROCHLOROTHIAZIDE 20-25 MG PO TABS: 1.0000 | ORAL_TABLET | Freq: Every day | ORAL | Status: DC

## 2012-06-12 MED ORDER — OXYCODONE-ACETAMINOPHEN 5-325 MG PO TABS: 2.0000 | ORAL_TABLET | Freq: Four times a day (QID) | ORAL | Status: DC | PRN

## 2012-06-12 NOTE — Assessment & Plan Note (Signed)
His A1C is still somehow high at 7.2% and increased from 7.1 last year. I believe Metformin together with life style changes is enough treatment at the moment. I have discussed with him the importance of health diet Plan  - Continue with Metformin 1000 mg twice daily.  - We will check on his next visit and if still high, I will add another agent.  - Schedule an appointment with Lupita Leash next visit. Lab Results  Component Value Date   HGBA1C 7.2 06/12/2012   HGBA1C 7.1 01/16/2012   HGBA1C 6.6 03/23/2011     Assessment:  Diabetes control: good control (HgbA1C at goal)  Progress toward A1C goal:  at goal   Plan:  Medications:  continue current medications  Home glucose monitoring:   Frequency: 2 times a day   Timing: before breakfast;at bedtime  Instruction/counseling given: reminded to get eye exam, reminded to bring blood glucose meter & log to each visit, reminded to bring medications to each visit, discussed the need for weight loss and discussed diet  Educational resources provided:    Self management tools provided:    Other plans: I will write a prescription for a glucometer.

## 2012-06-12 NOTE — Assessment & Plan Note (Signed)
There are no signs of infection at this time, however given his hardware in that foot he has an increased risk for infection. He has an appointment with Dr Lajoyce Corners in 8 days.   Plan  - X ray of right foot - No evidence of osteomyelitis - increased his pain medication to 10-650 mg q 6 hours as needed for pain  - follow up with Dr Lajoyce Corners on 06/12/2012.

## 2012-06-12 NOTE — Patient Instructions (Addendum)
  General Instructions: We will do an x ray of your right foot today  Please increase you Percocet to two tables  Please stop taking Indomethacin since this can affect treatment for your blood pressure  I have sent a prescription for Flexeril  Please check you blood pressure at least once daily Please follow up with Dr Lajoyce Corners on the 06/20/2012 Make appointment with Lupita Leash the next time you come to the clinic  Treatment Goals:  Goals (1 Years of Data) as of 06/12/2012          As of Today 06/08/12 05/29/12 05/29/12 05/17/12     Blood Pressure    . Blood Pressure < 140/90  188/111 150/98 106/51 117/65 95/65    . Blood Pressure < 140/90  188/111 150/98 106/51 117/65 95/65     Result Component    . HEMOGLOBIN A1C < 7.0  7.2        . LDL CALC < 100           Weight    . Weight < 200 lb (90.719 kg)  240 lb (108.863 kg)          Progress Toward Treatment Goals:  Treatment Goal 06/12/2012  Hemoglobin A1C at goal  Blood pressure unable to assess    Self Care Goals & Plans:  Self Care Goal 06/12/2012  Manage my medications bring my medications to every visit; take my medicines as prescribed; refill my medications on time  Monitor my health keep track of my blood pressure; keep track of my blood glucose  Eat healthy foods eat baked foods instead of fried foods; eat foods that are low in salt; drink diet soda or water instead of juice or soda    Home Blood Glucose Monitoring 06/12/2012  Check my blood sugar 2 times a day  When to check my blood sugar before breakfast; at bedtime     Care Management & Community Referrals:  Referral 06/12/2012  Referrals made for care management support diabetes educator

## 2012-06-12 NOTE — Assessment & Plan Note (Addendum)
   BP Readings from Last 3 Encounters:  06/12/12 188/111  06/08/12 150/98  05/29/12 106/51    Lab Results  Component Value Date   NA 134* 05/29/2012   K 4.7 05/29/2012   CREATININE 1.15 05/29/2012    Assessment:  Blood pressure control: severely elevated  Progress toward BP goal:  unable to assess  Comments: Missed his medications for 2 days, does not check at home.   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

## 2012-06-12 NOTE — Assessment & Plan Note (Signed)
He is low back pain has persisted. He did not fill his prescription for Flexeril and gabapentin. He was unable to followup with outpatient physical therapy due to cost. However, his been using oxycodone-acetaminophen, 5/325 mg one tablet every 6 hours. He reports some relief with this medication. He has no neurological signs or symptoms at this point. I believe by doubling his Percocet dose, he will achieve good pain relief. He clearly cannot afford other treatment modalities like PT.   Plan  - Increase his Percocet 5-325 to 10-650 every 6 hours prn for pain  - Prescribed Flexeril 5 mg tid

## 2012-06-12 NOTE — Progress Notes (Addendum)
Patient ID: Daniel Perkins, male   DOB: 02-Aug-1955, 57 y.o.   MRN: [id number]  Subjective:   Patient ID: Daniel Perkins male   DOB: 11-May-1955 57 y.o.   MRN: [id number]  HPI: DanielDaniel Perkins is a 57 y.o. 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  Right foot pain: Patient reports that his been experiencing right foot pain for over the last 3 weeks exacerbating by bearing weight on that foot. The pain is a 7/10, relieved by rest, constant, sharp, nonradiating, and has reduced since he presented to urgent care 5 days ago. At urgent care at that time, he was treated as a flare of his gout. He denies history of fevers or chills, or fatigue. He denies nausea, or vomiting. Denies night sweats. He uses Percocet 5-325 mg one tablet every 6 hours, and it has been helping him for his pain. He also recently got a prescription of colchicine 0.6 mg twice daily together with Indomethacin. He believes that this has helped with his pain. He does not take allopurinol. He reports that his last flare of gout was several months ago. His recent uric acid level was 6.8. He as an appointment with Dr Lajoyce Corners of orthopedics on feb, 12th.   Chronic low back pain: He was seen in the clinic in November 2013 and he was felt to her sustained a slipped disc in his low back. He still continues to have shooting pain in the left lower extremity. He was unable to followup with physical therapy due to cost. The co-pay is $30 per visit. He also reports accompanying cramps. He ran out of his gabapentin and Flexeril. He denies any other symptoms of urinary incontinence, localized weakness, change of sensation, or loss of bowel or urinary control.  Diabetes: His A1c today is 7.2%. Patient is on metformin 1000 mg twice daily. He is compliant with  this treatment. However, he does not have a glucose meter to check his blood sugars. He also reports that does not exercise a lot because of his disability.   Hypertension: Daniel Perkins has largely been having control of his hypertension with lisinopril-HCTZ , 20 mg-25 mg, recent addition of Lopressor 25 mg twice a day. His blood pressure in the clinic is extremely high at 188/111. This is the highest it has been. He reports that his been out of his medications for high blood pressure for over one week. He has challenges affording most of his medications. He does not check his blood pressure at home because it doesn't have a blood pressure machine. He is otherwise asymptomatic without headache or visual changes.  Coronary artery disease: Daniel Perkins had a cardiac cath, which revealed 20% stenosis obtuse marginal vessel in 2011. His echocardiogram revealed some diastolic dysfunction, but the ejection fraction was normal. In 2012 he had a normal Myoview scan. His following closely with his cardiologist at [facility] cardiology who recently added Lopressor to his regimen. However, the patient denies any history of chest pain, shortness of breath, PND, or orthopnea. Does not have palpitations.  Erectile dysfunction: He complains of reduced sexual power with his penis failing to achieve good erection. He couldn't feel his recent prescription of sildenafil due to cost.   Past Medical History  Diagnosis Date  . Coronary artery disease     Holter monitor 02/2012 - sinus with rare PVC  . Gout   .  Hyperlipidemia   . Hypertension   . Osteomyelitis   . CTEV (congenital talipes equinovarus)   . Diabetes mellitus without complication    Current Outpatient Prescriptions  Medication Sig Dispense Refill  . colchicine 0.6 MG tablet Take 1 tablet (0.6 mg total) by mouth 2 (two) times daily.  30 tablet  0  . indomethacin (INDOCIN) 50 MG capsule Take 1 capsule (50 mg total) by mouth 2 (two) times daily with  a meal.  30 capsule  1  . metFORMIN (GLUCOPHAGE) 1000 MG tablet Take 1,000 mg by mouth 2 (two) times daily with a meal.      . metoprolol tartrate (LOPRESSOR) 25 MG tablet Take 1 tablet (25 mg total) by mouth 2 (two) times daily.  60 tablet  11  . oxyCODONE-acetaminophen (PERCOCET/ROXICET) 5-325 MG per tablet Take 2 tablets by mouth every 6 (six) hours as needed for pain.  20 tablet  0  . pravastatin (PRAVACHOL) 20 MG tablet Take 20 mg by mouth daily.      Marland Kitchen azithromycin (ZITHROMAX) 250 MG tablet Take 1 tablet (250 mg total) by mouth daily. Take first 2 tablets together, then 1 every day until finished.  6 tablet  0  . cyclobenzaprine (FLEXERIL) 5 MG tablet Take 1 tablet (5 mg total) by mouth 3 (three) times daily as needed. Muscle spasms  30 tablet  2  . gabapentin (NEURONTIN) 300 MG capsule Take 300 mg by mouth 3 (three) times daily. Pain      . lisinopril-hydrochlorothiazide (PRINZIDE,ZESTORETIC) 20-25 MG per tablet Take 1 tablet by mouth daily.  30 tablet  11  . sildenafil (VIAGRA) 50 MG tablet Take 1 tablet (50 mg total) by mouth daily as needed for erectile dysfunction.  5 tablet  2   Family History  Problem Relation Age of Onset  . Diabetes Mother   . Coronary artery disease Mother     HAS PACEMAKER  . Heart disease Mother   . Heart attack Brother    History   Social History  . Marital Status: Married    Spouse Name: N/A    Number of Children: N/A  . Years of Education: N/A   Social History Main Topics  . Smoking status: Former Smoker    Quit date: 07/06/2007  . Smokeless tobacco: Never Used  . Alcohol Use: No  . Drug Use: No  . Sexually Active: None   Other Topics Concern  . None   Social History Narrative  . None   Review of Systems: Constitutional: Denies fever, chills, diaphoresis, appetite change and fatigue.  HEENT: Denies photophobia, eye pain, redness, hearing loss, ear pain, congestion, sore throat, rhinorrhea, sneezing, mouth sores, trouble swallowing, neck  pain, neck stiffness and tinnitus.   Respiratory: Denies SOB, DOE, cough, chest tightness,  and wheezing.   Cardiovascular: Denies chest pain, palpitations and leg swelling.  Gastrointestinal: Denies nausea, vomiting, abdominal pain, diarrhea, constipation, blood in stool and abdominal distention.  Genitourinary: Denies dysuria, urgency, frequency, hematuria, flank pain and difficulty urinating.  Skin: Denies pallor, rash and wound.  Neurological: Denies dizziness, seizures, syncope, weakness, light-headedness, numbness and headaches.  Hematological: Denies adenopathy. Easy bruising, personal or family bleeding history  Psychiatric/Behavioral: Denies suicidal ideation, mood changes, confusion, nervousness, sleep disturbance and agitation  Objective:  Physical Exam: Filed Vitals:   06/12/12 0903  BP: 188/111  Pulse: 97  Temp: 97 F (36.1 C)  TempSrc: Oral  Resp: 20  Height: 5\' 6"  (1.676 m)  Weight: 240 lb (108.863 kg)  SpO2: 99%   Constitutional: Vital signs reviewed.  Obese, seated in wheel chair. Patient is a well-developed and well-nourished in no acute distress and cooperative with exam. Alert and oriented x3. He has braces on both is lower extremities.  Head: Normocephalic and atraumatic Ear: TM normal bilaterally Mouth: no erythema or exudates, MMM Eyes: PERRL, EOMI, conjunctivae normal, No scleral icterus.  Neck: Supple, Trachea midline normal ROM, No JVD, mass, thyromegaly, or carotid bruit present.  Cardiovascular: RRR, S1 normal, S2 normal, no MRG, pulses symmetric and intact bilaterally Pulmonary/Chest: CTAB, no wheezes, rales, or rhonchi Abdominal: Soft. Non-tender, non-distended, bowel sounds are normal, no masses, organomegaly, or guarding present.  GU: no CVA tenderness Musculoskeletal: There are multiple surgical scars from previous orthopedic surgeries on both his lower extremities. He is using braces. The right foot has no evidence of erythema, swelling, but has  mild tenderness on deep palpation mostly around the medial malleolus and the dorsal medial aspect of his foot. There is no loss of sensation. There are no open wounds visible. There is limited range of motion of the right ankle joint, mainly do to chronic deformity. There is tenderness in the sole of the foot. The pulses in both feet are present and normal. There is no evidence of fluid collection in the uncal joint. His other joints including the small joints of the foot, and the knee is appear to be normal.  There no tenderness in the back. No deformity. Hematology: no cervical, inginal, or axillary adenopathy.  Neurological: A&O x3, Strength is normal and symmetric bilaterally, cranial nerve II-XII are grossly intact, no focal motor deficit, sensory intact to light touch bilaterally.  Skin: Warm, dry and intact. No rash, cyanosis, or clubbing.  Psychiatric: Normal mood and affect. speech and behavior is normal. Judgment and thought content normal. Cognition and memory are normal.    Assessment and Plan   I have discussed my assessment, and plan for the care of [name] with Dr. Kem Kays as detailed under problem based charting.   In the brief of ordered a CBC, a right foot x-ray to evaluate for septic arthritis, encouraged the patient to stop using indomethacin, increased his Percocet to 2 tablets every 6 hours. I have also prescribed Flexeril. He will followup with me in 2 weeks to recheck his blood pressure. I have also encouraged him to keep his appointment with Dr Lajoyce Corners

## 2012-06-26 ENCOUNTER — Ambulatory Visit: Payer: Medicare Other | Admitting: Internal Medicine

## 2012-06-26 ENCOUNTER — Encounter: Payer: Self-pay | Admitting: Internal Medicine

## 2012-06-26 ENCOUNTER — Encounter: Payer: Medicare Other | Admitting: Dietician

## 2012-06-28 ENCOUNTER — Other Ambulatory Visit: Payer: Self-pay | Admitting: Internal Medicine

## 2012-06-28 DIAGNOSIS — Z0000 Encounter for general adult medical examination without abnormal findings: Secondary | ICD-10-CM | POA: Insufficient documentation

## 2012-06-29 ENCOUNTER — Encounter: Payer: Self-pay | Admitting: Internal Medicine

## 2012-06-29 ENCOUNTER — Ambulatory Visit (INDEPENDENT_AMBULATORY_CARE_PROVIDER_SITE_OTHER): Payer: Medicare Other | Admitting: Internal Medicine

## 2012-06-29 VITALS — BP 120/80 | HR 102 | Temp 98.6°F | Ht 66.0 in | Wt 231.8 lb

## 2012-06-29 DIAGNOSIS — E1149 Type 2 diabetes mellitus with other diabetic neurological complication: Secondary | ICD-10-CM

## 2012-06-29 DIAGNOSIS — Z789 Other specified health status: Secondary | ICD-10-CM | POA: Insufficient documentation

## 2012-06-29 DIAGNOSIS — I2510 Atherosclerotic heart disease of native coronary artery without angina pectoris: Secondary | ICD-10-CM

## 2012-06-29 DIAGNOSIS — E785 Hyperlipidemia, unspecified: Secondary | ICD-10-CM

## 2012-06-29 DIAGNOSIS — Z888 Allergy status to other drugs, medicaments and biological substances status: Secondary | ICD-10-CM

## 2012-06-29 DIAGNOSIS — I10 Essential (primary) hypertension: Secondary | ICD-10-CM

## 2012-06-29 DIAGNOSIS — K219 Gastro-esophageal reflux disease without esophagitis: Secondary | ICD-10-CM

## 2012-06-29 MED ORDER — ASPIRIN EC 81 MG PO TBEC: 81.0000 mg | DELAYED_RELEASE_TABLET | Freq: Every day | ORAL | Status: AC

## 2012-06-29 MED ORDER — ACCU-CHEK AVIVA PLUS W/DEVICE KIT: 1.0000 | PACK | Freq: Once | Status: DC

## 2012-06-29 MED ORDER — GLUCOSE BLOOD VI STRP: ORAL_STRIP | Status: DC

## 2012-06-29 MED ORDER — LANCET DEVICES MISC: 1.0000 | Freq: Three times a day (TID) | Status: DC

## 2012-06-29 MED ORDER — RANITIDINE HCL 150 MG PO TABS: 150.0000 mg | ORAL_TABLET | Freq: Two times a day (BID) | ORAL | Status: DC | PRN

## 2012-06-29 NOTE — Patient Instructions (Addendum)
Please continue taking your medications as prescribed  Please stop taking black bull Please check your blood sugar every 2-3 times per week and bring your meter Please comeback in one month for a clinic visit  Please increase your exercise and eat health diet

## 2012-06-29 NOTE — Progress Notes (Signed)
Patient ID: Lenis Nettleton, male   DOB: 10/17/1955, 57 y.o.   MRN: [id number] Patient ID: Lora Glomski, male   DOB: 07-06-1955, 57 y.o.   MRN: [id number]  Subjective:   Patient ID: Barnaby Rippeon male   DOB: 08-24-1955 57 y.o.   MRN: [id number]  HPI: Mr.Zephan Lipsett is a 57 y.o. 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   He reports recurrence of cramps with Pravastatin which was restarted recently.  He reports compliance with his medications. He had a steroid injection for right foot pain at Dr Audrie Lia office last week. The pain is now better.   Please see the A&P for the status of the pt's chronic medical problems.   Past Medical History  Diagnosis Date  . Coronary artery disease     Holter monitor 02/2012 - sinus with rare PVC  . Gout   . Hyperlipidemia   . Hypertension   . Osteomyelitis   . CTEV (congenital talipes equinovarus)   . Diabetes mellitus without complication    Current Outpatient Prescriptions  Medication Sig Dispense Refill  . colchicine 0.6 MG tablet Take 1 tablet (0.6 mg total) by mouth 2 (two) times daily.  30 tablet  0  . cyclobenzaprine (FLEXERIL) 5 MG tablet Take 1 tablet (5 mg total) by mouth 3 (three) times daily as needed. Muscle spasms  30 tablet  2  . lisinopril-hydrochlorothiazide (PRINZIDE,ZESTORETIC) 20-25 MG per tablet Take 1 tablet by mouth daily.  30 tablet  11  . metFORMIN (GLUCOPHAGE) 1000 MG tablet Take 1,000 mg by mouth 2 (two) times daily with a meal.      . metoprolol tartrate (LOPRESSOR) 25 MG tablet Take 1 tablet (25 mg total) by mouth 2 (two) times daily.  60 tablet  11  . oxyCODONE-acetaminophen (PERCOCET/ROXICET) 5-325 MG per tablet Take 2 tablets by mouth every 6 (six) hours as needed for pain.  20 tablet  0  .  aspirin EC 81 MG tablet Take 1 tablet (81 mg total) by mouth daily.  150 tablet  2  . Blood Glucose Monitoring Suppl (ACCU-CHEK AVIVA PLUS) W/DEVICE KIT 1 kit by Does not apply route once.  1 kit  0  . glucose blood (ACCU-CHEK AVIVA PLUS) test strip Use as instructed  100 each  12  . Lancet Devices MISC 1 kit by Does not apply route 3 (three) times daily.  100 each  11  . ranitidine (ZANTAC) 150 MG tablet Take 1 tablet (150 mg total) by mouth 2 (two) times daily as needed for heartburn.  30 tablet  11  . sildenafil (VIAGRA) 50 MG tablet Take 1 tablet (50 mg total) by mouth daily as needed for erectile dysfunction.  5 tablet  2   No current facility-administered medications for this visit.   Family History  Problem Relation Age of Onset  . Diabetes Mother   . Coronary artery disease Mother     HAS PACEMAKER  . Heart disease Mother   . Heart attack Brother    History   Social History  . Marital Status: Married    Spouse Name: N/A    Number of Children: N/A  . Years of Education: N/A   Social History Main Topics  . Smoking status: Former Smoker    Quit date: 07/06/2007  . Smokeless tobacco: Never Used  . Alcohol Use:  No  . Drug Use: No  . Sexually Active: None   Other Topics Concern  . None   Social History Narrative  . None   Review of Systems: Constitutional: Denies fever, chills, diaphoresis, appetite change and fatigue.  HEENT: Denies photophobia, eye pain, redness, hearing loss, ear pain, congestion, sore throat, rhinorrhea, sneezing, mouth sores, trouble swallowing, neck pain, neck stiffness and tinnitus.   Respiratory: Denies SOB, DOE, cough, chest tightness,  and wheezing.   Cardiovascular: Denies chest pain, palpitations and leg swelling.  Gastrointestinal: Denies nausea, vomiting, abdominal pain, diarrhea, constipation, blood in stool and abdominal distention.  Genitourinary: Denies dysuria, urgency, frequency, hematuria, flank pain and difficulty urinating.   Skin: Denies pallor, rash and wound.  Neurological: Denies dizziness, seizures, syncope, weakness, light-headedness, numbness and headaches.  Hematological: Denies adenopathy. Easy bruising, personal or family bleeding history  Psychiatric/Behavioral: Denies suicidal ideation, mood changes, confusion, nervousness, sleep disturbance and agitation  Objective:  Physical Exam: Filed Vitals:   06/29/12 0935 06/29/12 1012  BP: 135/91 120/80  Pulse: 102   Temp: 98.6 F (37 C)   TempSrc: Oral   Height: 5\' 6"  (1.676 m)   Weight: 231 lb 12.8 oz (105.144 kg)   SpO2: 99%    Constitutional: Vital signs reviewed.  Obese, patient is a well-developed and well-nourished in no acute distress and cooperative with exam. Alert and oriented x3.  Head: Normocephalic and atraumatic Ear: TM normal bilaterally Mouth: no erythema or exudates, MMM Eyes: PERRL, EOMI, conjunctivae normal, No scleral icterus.  Neck: Supple, Trachea midline normal ROM, No JVD, mass, thyromegaly, or carotid bruit present.  Cardiovascular: RRR, S1 normal, S2 normal, no MRG, pulses symmetric and intact bilaterally Pulmonary/Chest: CTAB, no wheezes, rales, or rhonchi Abdominal: Soft. Non-tender, non-distended, bowel sounds are normal, no masses, organomegaly, or guarding present.  GU: no CVA tenderness Musculoskeletal: There are multiple surgical scars from previous orthopedic surgeries on both his lower extremities. There no tenderness in the back. No deformity. Hematology: no cervical, inginal, or axillary adenopathy.  Neurological: A&O x3, Strength is normal and symmetric bilaterally, cranial nerve II-XII are grossly intact, no focal motor deficit, sensory intact to light touch bilaterally.  Skin: Warm, dry and intact. No rash, cyanosis, or clubbing.  Psychiatric: Normal mood and affect. speech and behavior is normal. Judgment and thought content normal. Cognition and memory are normal.    Assessment and Plan   I have discussed  my assessment, and plan for the care of Mr. Janczak with Dr. Dalphine Handing as detailed under problem based charting.   In the brief, I have congratulated him on his medication compliance. I have counseled him on life style change with heath diet, exercise and weight loss since his can not tolerate statin therapy. I have added aspirin to his regimen. He will follow up in 1 month.

## 2012-06-29 NOTE — Assessment & Plan Note (Signed)
Lab Results  Component Value Date   HGBA1C 7.2 06/12/2012   HGBA1C 7.1 01/16/2012   HGBA1C 6.6 03/23/2011     Assessment:  Diabetes control: fair control  Progress toward A1C goal:  unchanged or worse  Comments:   Plan:  Medications:  continue current medications  Home glucose monitoring:   Frequency: once a day   Timing: before breakfast  Instruction/counseling given: reminded to get eye exam, reminded to bring blood glucose meter & log to each visit, reminded to bring medications to each visit, discussed foot care, discussed the need for weight loss and discussed diet  Educational resources provided: brochure  Self management tools provided:    Other plans: Ordered a glucose meter for him

## 2012-06-29 NOTE — Assessment & Plan Note (Signed)
BP Readings from Last 3 Encounters:  06/29/12 120/80  06/12/12 188/111  06/08/12 150/98    Lab Results  Component Value Date   NA 134* 05/29/2012   K 4.7 05/29/2012   CREATININE 1.15 05/29/2012    Assessment:  Blood pressure control: controlled  Progress toward BP goal:  at goal  Comments: better controlled today  Plan:  Medications:  continue current medications  Educational resources provided: brochure  Self management tools provided: home blood pressure logbook  Other plans: I have counseled him to buy a BP cuff

## 2012-06-29 NOTE — Assessment & Plan Note (Signed)
Patient is on a beta-blocker, ACEI and started aspirin today. His has statin intolerance.   Plan  - Start ASA - Life style changes discussed

## 2012-07-11 ENCOUNTER — Encounter: Payer: Self-pay | Admitting: Cardiovascular Disease

## 2012-07-11 ENCOUNTER — Ambulatory Visit (INDEPENDENT_AMBULATORY_CARE_PROVIDER_SITE_OTHER): Payer: Medicare Other | Admitting: Cardiovascular Disease

## 2012-07-11 VITALS — BP 118/78 | HR 96 | Ht 66.0 in | Wt 229.0 lb

## 2012-07-11 DIAGNOSIS — I10 Essential (primary) hypertension: Secondary | ICD-10-CM

## 2012-07-11 DIAGNOSIS — E785 Hyperlipidemia, unspecified: Secondary | ICD-10-CM

## 2012-07-11 DIAGNOSIS — I2510 Atherosclerotic heart disease of native coronary artery without angina pectoris: Secondary | ICD-10-CM

## 2012-07-11 MED ORDER — ATORVASTATIN CALCIUM 20 MG PO TABS: 20.0000 mg | ORAL_TABLET | Freq: Every day | ORAL | Status: DC

## 2012-07-11 NOTE — Progress Notes (Signed)
History of Present Illness: 57 yo AAM with history of mild non-obstructive CAD by cath April 2011, DM, Hyperlipidemia, HTN, Gout who is here today for cardiac follow up. He had a cardiac cath in April 2011 with mild 20% stenosis in the obtuse marginal but no other disease. His echo showed diastolic dysfunction with normal LV systolic function, mild AI. His stress myoview April 2012 with no ischemia. He was seen here in our office 03/22/12 by Dr. Eden Emms. C/O dyspnea. Cardiopulmonary stress test with hypertensive response to exercise. I last saw him in January 2014.    He has been feeling well. No chest pain with exertion and no dyspnea. He does not smoke, quit 2008. Most of his issues are with chronic pain in his feet.   Primary Care Physician: Dow Adolph  Last Lipid Profile:Lipid Panel     Component Value Date/Time   CHOL 219* 01/16/2012 0913   TRIG 148 01/16/2012 0913   HDL 35* 01/16/2012 0913   CHOLHDL 6.3 01/16/2012 0913   VLDL 30 01/16/2012 0913   LDLCALC 154* 01/16/2012 0913     Past Medical History  Diagnosis Date  . Coronary artery disease     Holter monitor 02/2012 - sinus with rare PVC  . Gout   . Hyperlipidemia   . Hypertension   . Osteomyelitis   . CTEV (congenital talipes equinovarus)   . Diabetes mellitus without complication     Past Surgical History  Procedure Laterality Date  . Neck surgery    . Foot surgery  x16    Both feet    Current Outpatient Prescriptions  Medication Sig Dispense Refill  . aspirin EC 81 MG tablet Take 1 tablet (81 mg total) by mouth daily.  150 tablet  2  . Blood Glucose Monitoring Suppl (ACCU-CHEK AVIVA PLUS) W/DEVICE KIT 1 kit by Does not apply route once.  1 kit  0  . colchicine 0.6 MG tablet Take 1 tablet (0.6 mg total) by mouth 2 (two) times daily.  30 tablet  0  . cyclobenzaprine (FLEXERIL) 5 MG tablet Take 1 tablet (5 mg total) by mouth 3 (three) times daily as needed. Muscle spasms  30 tablet  2  . glucose blood (ACCU-CHEK AVIVA  PLUS) test strip Use as instructed  100 each  12  . Lancet Devices MISC 1 kit by Does not apply route 3 (three) times daily.  100 each  11  . lisinopril-hydrochlorothiazide (PRINZIDE,ZESTORETIC) 20-25 MG per tablet Take 1 tablet by mouth daily.  30 tablet  11  . metFORMIN (GLUCOPHAGE) 1000 MG tablet Take 1,000 mg by mouth 2 (two) times daily with a meal.      . metoprolol tartrate (LOPRESSOR) 25 MG tablet Take 1 tablet (25 mg total) by mouth 2 (two) times daily.  60 tablet  11  . oxyCODONE-acetaminophen (PERCOCET/ROXICET) 5-325 MG per tablet Take 2 tablets by mouth every 6 (six) hours as needed for pain.  20 tablet  0  . sildenafil (VIAGRA) 50 MG tablet Take 1 tablet (50 mg total) by mouth daily as needed for erectile dysfunction.  5 tablet  2   No current facility-administered medications for this visit.    Allergies  Allergen Reactions  . Morphine Itching  . Pravastatin Sodium Other (See Comments)    Severe muscle cramps with one time requiring admission.     History   Social History  . Marital Status: Married    Spouse Name: N/A    Number of Children: N/A  .  Years of Education: N/A   Occupational History  . Not on file.   Social History Main Topics  . Smoking status: Former Smoker    Quit date: 07/06/2007  . Smokeless tobacco: Never Used  . Alcohol Use: No  . Drug Use: No  . Sexually Active: Not on file   Other Topics Concern  . Not on file   Social History Narrative  . No narrative on file    Family History  Problem Relation Age of Onset  . Diabetes Mother   . Coronary artery disease Mother     HAS PACEMAKER  . Heart disease Mother   . Heart attack Brother     Review of Systems:  As stated in the HPI and otherwise negative.   BP 118/78  Pulse 96  Ht 5\' 6"  (1.676 m)  Wt 229 lb (103.874 kg)  BMI 36.98 kg/m2  Physical Examination: General: Well developed, well nourished, NAD HEENT: OP clear, mucus membranes moist SKIN: warm, dry. No rashes. Neuro: No  focal deficits Musculoskeletal: Muscle strength 5/5 all ext Psychiatric: Mood and affect normal Neck: No JVD, no carotid bruits, no thyromegaly, no lymphadenopathy. Lungs:Clear bilaterally, no wheezes, rhonci, crackles Cardiovascular: Regular rate and rhythm. No murmurs, gallops or rubs. Abdomen:Soft. Bowel sounds present. Non-tender.  Extremities: No lower extremity edema. Pulses are 2 + in the bilateral DP/PT.  Assessment and Plan:   1. CAD: Mild by cath 2011. Continue medical management   2. ED: Continue Viagra  3. HTN: BP well controlled.  Continue Lisinopril/HCTZ and Lopressor.   4. Hyperlipidemia: He did not tolerate Pravastatin in the past due to leg cramps but wants to start another medication. Will try atorvastatin 20 mg po QHS. Will repeat lipids and LFTs in 12 weeks.

## 2012-07-11 NOTE — Patient Instructions (Addendum)
Your physician wants you to follow-up in:  12 months. You will receive a reminder letter in the mail two months in advance. If you don't receive a letter, please call our office to schedule the follow-up appointment.  Your physician recommends that you return for fasting lab work in:  3 months--first week of June--Lipid and Liver profiles and CK  Your physician has recommended you make the following change in your medication:  Start Atorvastatin 20 mg by mouth daily at bedtime

## 2012-08-01 ENCOUNTER — Ambulatory Visit (INDEPENDENT_AMBULATORY_CARE_PROVIDER_SITE_OTHER): Payer: Medicare Other | Admitting: Internal Medicine

## 2012-08-01 ENCOUNTER — Encounter: Payer: Self-pay | Admitting: Internal Medicine

## 2012-08-01 VITALS — BP 127/78 | HR 77 | Temp 97.8°F | Ht 66.0 in | Wt 227.4 lb

## 2012-08-01 DIAGNOSIS — E1149 Type 2 diabetes mellitus with other diabetic neurological complication: Secondary | ICD-10-CM

## 2012-08-01 DIAGNOSIS — Z1211 Encounter for screening for malignant neoplasm of colon: Secondary | ICD-10-CM

## 2012-08-01 DIAGNOSIS — Z888 Allergy status to other drugs, medicaments and biological substances status: Secondary | ICD-10-CM

## 2012-08-01 DIAGNOSIS — J392 Other diseases of pharynx: Secondary | ICD-10-CM

## 2012-08-01 DIAGNOSIS — M109 Gout, unspecified: Secondary | ICD-10-CM

## 2012-08-01 DIAGNOSIS — I10 Essential (primary) hypertension: Secondary | ICD-10-CM

## 2012-08-01 DIAGNOSIS — I2510 Atherosclerotic heart disease of native coronary artery without angina pectoris: Secondary | ICD-10-CM

## 2012-08-01 DIAGNOSIS — Z789 Other specified health status: Secondary | ICD-10-CM

## 2012-08-01 LAB — GLUCOSE, CAPILLARY: Glucose-Capillary: 134 mg/dL — ABNORMAL HIGH (ref 70–99)

## 2012-08-01 MED ORDER — GUAIFENESIN 100 MG/5ML PO LIQD: 200.0000 mg | Freq: Three times a day (TID) | ORAL | Status: DC | PRN

## 2012-08-01 NOTE — Assessment & Plan Note (Signed)
His cardiologist, Dr. Danny Lawless, would like to try Lipitor. The patient has not filled in this prescription. Will closely monitor for any muscle spasms.

## 2012-08-01 NOTE — Assessment & Plan Note (Signed)
Currently he denies any joint pains. He continues to take colchicine 0.6 mg twice daily. His last serum uric levels where her high about 10.  Plan  -Continue with colchicine  -Uric acid level today -If he continues getting recurrent gout flares, I will start him on allopurinol. Will

## 2012-08-01 NOTE — Progress Notes (Signed)
Patient ID: Daniel Perkins, male   DOB: Feb 21, 1956, 57 y.o.   MRN: [id number]   Subjective:   Patient ID: Daniel Perkins male   DOB: 1956-03-28 57 y.o.   MRN: [id number]  HPI: [name] is a 57 y.o. with past medical history of coronary artery disease, hypertension, hyperlipidemia, type 2 diabetes, presents to the clinic for routine visit. He does not have any complaints today. He reports compliance with his medications. However, he says, that he has not filled his prescription of Lipitor, which was prescribed by his cardiologist on the 07/11/2012 due to cost.  I reminded him to bring his medications on every visit.  Please see the A&P for the status of the pt's chronic medical problems.  Past Medical History  Diagnosis Date  . Coronary artery disease     Holter monitor 02/2012 - sinus with rare PVC  . Gout   . Hyperlipidemia   . Hypertension   . Osteomyelitis   . CTEV (congenital talipes equinovarus)   . Diabetes mellitus without complication    Current Outpatient Prescriptions  Medication Sig Dispense Refill  . aspirin EC 81 MG tablet Take 1 tablet (81 mg total) by mouth daily.  150 tablet  2  . colchicine 0.6 MG tablet Take 1 tablet (0.6 mg total) by mouth 2 (two) times daily.  30 tablet  0  . lisinopril-hydrochlorothiazide (PRINZIDE,ZESTORETIC) 20-25 MG per tablet Take 1 tablet by mouth daily.  30 tablet  11  . metFORMIN (GLUCOPHAGE) 1000 MG tablet Take 1,000 mg by mouth 2 (two) times daily with a meal.      . metoprolol tartrate (LOPRESSOR) 25 MG tablet Take 1 tablet (25 mg total) by mouth 2 (two) times daily.  60 tablet  11  . atorvastatin (LIPITOR) 20 MG tablet Take 1 tablet (20 mg total) by mouth daily.  30 tablet  11  . Blood Glucose Monitoring Suppl (ACCU-CHEK AVIVA PLUS) W/DEVICE KIT 1 kit by Does not apply route once.  1 kit  0  . cyclobenzaprine (FLEXERIL) 5 MG tablet Take 1 tablet (5 mg total) by mouth 3 (three) times daily as needed. Muscle spasms  30  tablet  2  . glucose blood (ACCU-CHEK AVIVA PLUS) test strip Use as instructed  100 each  12  . guaiFENesin (ROBITUSSIN) 100 MG/5ML liquid Take 10 mLs (200 mg total) by mouth 3 (three) times daily as needed for cough.  120 mL  0  . Lancet Devices MISC 1 kit by Does not apply route 3 (three) times daily.  100 each  11  . oxyCODONE-acetaminophen (PERCOCET/ROXICET) 5-325 MG per tablet Take 2 tablets by mouth every 6 (six) hours as needed for pain.  20 tablet  0  . sildenafil (VIAGRA) 50 MG tablet Take 1 tablet (50 mg total) by mouth daily as needed for erectile dysfunction.  5 tablet  2   No current facility-administered medications for this visit.   Family History  Problem Relation Age of Onset  . Diabetes Mother   . Coronary artery disease Mother     HAS PACEMAKER  . Heart disease Mother   . Heart attack Brother    History   Social History  . Marital Status: Married    Spouse Name: N/A    Number of Children: N/A  . Years of Education: N/A   Social History Main Topics  . Smoking status: Former Smoker    Quit date: 07/06/2007  . Smokeless tobacco: Never Used  . Alcohol Use: No  .  Drug Use: No  . Sexually Active: Not on file   Other Topics Concern  . Not on file   Social History Narrative  . No narrative on file   Review of Systems: Constitutional: Denies fever, chills, diaphoresis, appetite change and fatigue.  HEENT: Denies photophobia, eye pain, redness, hearing loss, ear pain, congestion, sore throat, rhinorrhea, sneezing, mouth sores, trouble swallowing, neck pain, neck stiffness and tinnitus.  He is complaining of recurrent throat irritation, requiring clearing of his throat occasionally. It happens a few times a day. He asks whether I can help him with any medication to clear that irritation in the throat.   Respiratory: Denies SOB, DOE, cough, chest tightness,  and wheezing.   Cardiovascular: Denies chest pain, palpitations and leg swelling.  Gastrointestinal: Denies  nausea, vomiting, abdominal pain, diarrhea, constipation, blood in stool and abdominal distention.  Genitourinary: Denies dysuria, urgency, frequency, hematuria, flank pain and difficulty urinating.  Musculoskeletal: Denies myalgias, back pain, joint swelling, arthralgias and gait problem.  Skin: Denies pallor, rash and wound. No joint pains currently. He follows closely with Dr Lajoyce Corners Neurological: Denies dizziness, seizures, syncope, weakness, light-headedness, numbness and headaches.  Hematological: Denies adenopathy. Easy bruising, personal or family bleeding history  Psychiatric/Behavioral: Denies suicidal ideation, mood changes, confusion, nervousness, sleep disturbance and agitation  Objective:  Physical Exam: Filed Vitals:   08/01/12 1338  BP: 127/78  Pulse: 77  Temp: 97.8 F (36.6 C)  TempSrc: Oral  Height: 5\' 6"  (1.676 m)  Weight: 227 lb 6.4 oz (103.148 kg)  SpO2: 98%   Constitutional: Vital signs reviewed.  Patient is a well-developed and well-nourished in no acute distress and cooperative with exam. Alert and oriented x3.  Head: Normocephalic and atraumatic Ear: TM normal bilaterally Mouth: no erythema or exudates, MMM Eyes: PERRL, EOMI, conjunctivae normal, No scleral icterus.  Neck: Supple, Trachea midline normal ROM, No JVD, mass, thyromegaly, or carotid bruit present.  Cardiovascular: RRR, S1 normal, S2 normal, no MRG, pulses symmetric and intact bilaterally Pulmonary/Chest: CTAB, no wheezes, rales, or rhonchi Abdominal: Soft. Non-tender, non-distended, bowel sounds are normal, no masses, organomegaly, or guarding present. Central obesity. 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 No erythema, or stiffness, ROM full and no nontender Hematology: no cervical, inginal, or axillary adenopathy.  Neurological: A&O x3, Strength is normal and symmetric bilaterally, cranial nerve II-XII are grossly intact, no focal motor  deficit, sensory intact to light touch bilaterally.  Skin: Warm, dry and intact. No rash, cyanosis, or clubbing.  Psychiatric: Normal mood and affect. speech and behavior is normal. Judgment and thought content normal. Cognition and memory are normal.   Assessment & Plan:  I have discussed my assessment, and plan for the care of Mr. Mcconaghy with Dr. Josem Kaufmann as detailed under my problem based charting.   In brief, we will check urine microalbumin to creatinine ratio and urine, uric acid levels. I have given him FOBT cards. He will return them for reading. He cannot afford a colonoscopy at this time. I have shown him the video for hypertension. He will followup in 6 months.

## 2012-08-01 NOTE — Assessment & Plan Note (Signed)
BP Readings from Last 3 Encounters:  08/01/12 127/78  07/11/12 118/78  06/29/12 120/80    Lab Results  Component Value Date   NA 134* 05/29/2012   K 4.7 05/29/2012   CREATININE 1.15 05/29/2012    Assessment:  Blood pressure control: controlled  Progress toward BP goal:  at goal  Comments:   Plan:  Medications:  continue current medications  Educational resources provided:    Self management tools provided:    Other plans: showed him the video for HTN and he liked it.

## 2012-08-01 NOTE — Patient Instructions (Signed)
General Instructions:  We will check you urine today. I will contact you with results   Treatment Goals:  Goals (1 Years of Data) as of 08/01/12         As of Today 07/11/12 06/29/12 06/29/12 06/12/12     Blood Pressure    . Blood Pressure < 140/90  127/78 118/78 120/80 135/91 188/111    . Blood Pressure < 140/90  127/78 118/78 120/80 135/91 188/111     Result Component    . HEMOGLOBIN A1C < 7.0      7.2    . LDL CALC < 100          . LDL CALC < 130           Weight    . Weight < 200 lb (90.719 kg)  227 lb 6.4 oz (103.148 kg) 229 lb (103.874 kg) 231 lb 12.8 oz (105.144 kg)  240 lb (108.863 kg)      Progress Toward Treatment Goals:  Treatment Goal 08/01/2012  Hemoglobin A1C unchanged  Blood pressure at goal    Self Care Goals & Plans:  Self Care Goal 08/01/2012  Manage my medications take my medicines as prescribed; bring my medications to every visit; refill my medications on time  Monitor my health keep track of my blood glucose  Eat healthy foods eat more vegetables; eat foods that are low in salt  Be physically active -    Home Blood Glucose Monitoring 08/01/2012  Check my blood sugar 2 times a day  When to check my blood sugar before breakfast; before dinner     Care Management & Community Referrals:  Referral 08/01/2012  Referrals made for care management support none needed

## 2012-08-01 NOTE — Assessment & Plan Note (Signed)
Lab Results  Component Value Date   HGBA1C 7.2 06/12/2012   HGBA1C 7.1 01/16/2012   HGBA1C 6.6 03/23/2011     Assessment:  Diabetes control: good control (HgbA1C at goal)  Progress toward A1C goal:  unchanged  Comments:   Plan:  Medications:  continue current medications  Home glucose monitoring:   Frequency: 2 times a day   Timing: before breakfast;before dinner  Instruction/counseling given: reminded to bring blood glucose meter & log to each visit and reminded to bring medications to each visit  Educational resources provided:    Self management tools provided:    Other plans: will check urine MACR today.

## 2012-08-02 LAB — MICROALBUMIN / CREATININE URINE RATIO
Microalb Creat Ratio: 2.9 mg/g (ref 0.0–30.0)
Microalb, Ur: 0.5 mg/dL (ref 0.00–1.89)

## 2012-08-02 LAB — URIC ACID, RANDOM URINE: Uric Acid, Urine: 27.3 mg/dL

## 2012-08-14 ENCOUNTER — Other Ambulatory Visit (INDEPENDENT_AMBULATORY_CARE_PROVIDER_SITE_OTHER): Payer: Medicare Other

## 2012-08-14 DIAGNOSIS — Z1211 Encounter for screening for malignant neoplasm of colon: Secondary | ICD-10-CM

## 2012-08-14 NOTE — Addendum Note (Signed)
Addended by: Bufford Spikes on: 08/14/2012 09:57 AM   Modules accepted: Orders

## 2012-09-24 ENCOUNTER — Ambulatory Visit: Payer: Medicare Other | Admitting: Internal Medicine

## 2012-10-09 ENCOUNTER — Other Ambulatory Visit (INDEPENDENT_AMBULATORY_CARE_PROVIDER_SITE_OTHER): Payer: Medicare Other

## 2012-10-09 DIAGNOSIS — E785 Hyperlipidemia, unspecified: Secondary | ICD-10-CM

## 2012-10-09 DIAGNOSIS — I2510 Atherosclerotic heart disease of native coronary artery without angina pectoris: Secondary | ICD-10-CM

## 2012-10-09 LAB — LIPID PANEL
Cholesterol: 195 mg/dL (ref 0–200)
LDL Cholesterol: 145 mg/dL — ABNORMAL HIGH (ref 0–99)
Triglycerides: 90 mg/dL (ref 0.0–149.0)
VLDL: 18 mg/dL (ref 0.0–40.0)

## 2012-10-09 LAB — HEPATIC FUNCTION PANEL
Albumin: 3.5 g/dL (ref 3.5–5.2)
Total Protein: 7.2 g/dL (ref 6.0–8.3)

## 2012-11-02 ENCOUNTER — Other Ambulatory Visit: Payer: Self-pay | Admitting: *Deleted

## 2012-11-15 ENCOUNTER — Other Ambulatory Visit: Payer: Self-pay

## 2012-11-19 NOTE — Addendum Note (Signed)
Addended by: Dorie Rank E on: 11/19/2012 05:30 PM   Modules accepted: Orders

## 2012-11-20 ENCOUNTER — Encounter: Payer: Self-pay | Admitting: Internal Medicine

## 2012-11-21 ENCOUNTER — Encounter: Payer: Medicare Other | Admitting: Internal Medicine

## 2012-12-05 ENCOUNTER — Other Ambulatory Visit: Payer: Self-pay | Admitting: Dietician

## 2012-12-05 ENCOUNTER — Encounter: Payer: Self-pay | Admitting: Internal Medicine

## 2012-12-05 ENCOUNTER — Other Ambulatory Visit: Payer: Self-pay | Admitting: Internal Medicine

## 2012-12-05 ENCOUNTER — Ambulatory Visit (INDEPENDENT_AMBULATORY_CARE_PROVIDER_SITE_OTHER): Payer: Medicare Other | Admitting: Internal Medicine

## 2012-12-05 VITALS — BP 178/98 | HR 87 | Temp 97.7°F | Ht 66.0 in | Wt 240.7 lb

## 2012-12-05 DIAGNOSIS — I2510 Atherosclerotic heart disease of native coronary artery without angina pectoris: Secondary | ICD-10-CM

## 2012-12-05 DIAGNOSIS — E785 Hyperlipidemia, unspecified: Secondary | ICD-10-CM

## 2012-12-05 DIAGNOSIS — Q6600 Congenital talipes equinovarus, unspecified foot: Secondary | ICD-10-CM

## 2012-12-05 DIAGNOSIS — M109 Gout, unspecified: Secondary | ICD-10-CM

## 2012-12-05 DIAGNOSIS — E669 Obesity, unspecified: Secondary | ICD-10-CM

## 2012-12-05 DIAGNOSIS — E119 Type 2 diabetes mellitus without complications: Secondary | ICD-10-CM

## 2012-12-05 DIAGNOSIS — E1149 Type 2 diabetes mellitus with other diabetic neurological complication: Secondary | ICD-10-CM

## 2012-12-05 DIAGNOSIS — I10 Essential (primary) hypertension: Secondary | ICD-10-CM

## 2012-12-05 LAB — POCT GLYCOSYLATED HEMOGLOBIN (HGB A1C): Hemoglobin A1C: 6.8

## 2012-12-05 LAB — GLUCOSE, CAPILLARY: Glucose-Capillary: 141 mg/dL — ABNORMAL HIGH (ref 70–99)

## 2012-12-05 MED ORDER — GLUCOSE BLOOD VI STRP: ORAL_STRIP | Status: DC

## 2012-12-05 MED ORDER — FREESTYLE LITE DEVI: Status: DC

## 2012-12-05 MED ORDER — LISINOPRIL-HYDROCHLOROTHIAZIDE 20-25 MG PO TABS: 1.0000 | ORAL_TABLET | Freq: Every day | ORAL | Status: DC

## 2012-12-05 MED ORDER — METOPROLOL TARTRATE 25 MG PO TABS: 25.0000 mg | ORAL_TABLET | Freq: Two times a day (BID) | ORAL | Status: DC

## 2012-12-05 MED ORDER — ALLOPURINOL 100 MG PO TABS: 100.0000 mg | ORAL_TABLET | Freq: Every day | ORAL | Status: DC

## 2012-12-05 MED ORDER — COLCHICINE 0.6 MG PO TABS: 0.6000 mg | ORAL_TABLET | Freq: Two times a day (BID) | ORAL | Status: DC

## 2012-12-05 MED ORDER — METFORMIN HCL 1000 MG PO TABS: 1000.0000 mg | ORAL_TABLET | Freq: Two times a day (BID) | ORAL | Status: DC

## 2012-12-05 MED ORDER — LANCETS 30G MISC: Status: DC

## 2012-12-05 NOTE — Assessment & Plan Note (Addendum)
BP Readings from Last 3 Encounters:  12/05/12 178/98  08/01/12 127/78  07/11/12 118/78    Lab Results  Component Value Date   NA 134* 05/29/2012   K 4.7 05/29/2012   CREATININE 1.15 05/29/2012    Assessment: Blood pressure control: uncontrolled Progress toward BP goal:    Comments:   Plan: Medications:  continue current medications. Patient has been off his regular lisinopril/HCTZ. Educational resources provided:   Self management tools provided: home blood pressure logbook Other plans: He does not have a blood pressure cuff at home. Affordability is likely to be a problem. I have refilled his medications and encouraged him to keep them from the pharmacy. I suspect his blood pressure will improve once he restarts his regular medication.

## 2012-12-05 NOTE — Assessment & Plan Note (Signed)
His current right ankle pain is most likely due to gout. I do not suspect septic arthritis. Previous imaging of shown some osteoarthritis.   Plan -Will refill his colchicine and allopurinol. -No imaging warranted. -If the symptoms do not improve over the next few days, he has been advised to contact the clinic for further evaluation.

## 2012-12-05 NOTE — Assessment & Plan Note (Signed)
I have encouraged the patient to take his prescription of Lipitor.

## 2012-12-05 NOTE — Progress Notes (Signed)
[id number], Sep 25, 1955 Daniel Perkins male HPI: Daniel Perkins is a 57 y.o. with past medical history of diabetes type II, hypertension, hyperlipidemia, chronic low back pain, coronary artery disease, gout presents to the clinic with increased pain of his right ankle joint for one week  He reports that he ran out of his regular colchicine 0.6 mg twice a day for about 10 days and his pain in the right ankle is has gradually worsened since then. The pain is moderate to severe and, increased with bearing weight on the right ankle joint. It is relieved by rest. However, he reports that he has continued to take allopurinol 100 mg daily. He denies any history of fevers or chills. He reports that his legs sometimes feel stiff when he gets up from a seated position.  Of note, patient has difficulties affording his medications since his dependent on social security check.  Please see the A&P for the status of the pt's chronic medical problems.     Past Medical History  Diagnosis Date  . Coronary artery disease     Holter monitor 02/2012 - sinus with rare PVC  . Gout   . Hyperlipidemia   . Hypertension   . Osteomyelitis   . CTEV (congenital talipes equinovarus)   . Diabetes mellitus without complication    Current Outpatient Prescriptions  Medication Sig Dispense Refill  . aspirin EC 81 MG tablet Take 1 tablet (81 mg total) by mouth daily.  150 tablet  2  . allopurinol (ZYLOPRIM) 100 MG tablet Take 1 tablet (100 mg total) by mouth daily.  30 tablet  2  . colchicine 0.6 MG tablet Take 1 tablet (0.6 mg total) by mouth 2 (two) times daily.  30 tablet  0  . cyclobenzaprine (FLEXERIL) 5 MG tablet Take 1 tablet (5 mg total) by mouth 3 (three) times daily as needed. Muscle spasms  30 tablet  2  . glucose blood (ACCU-CHEK AVIVA PLUS) test strip Use as instructed  100 each  12  . guaiFENesin (ROBITUSSIN) 100 MG/5ML liquid Take 10 mLs (200 mg total) by mouth 3 (three) times daily as needed for  cough.  120 mL  0  . Lancet Devices MISC 1 kit by Does not apply route 3 (three) times daily.  100 each  11  . lisinopril-hydrochlorothiazide (PRINZIDE,ZESTORETIC) 20-25 MG per tablet Take 1 tablet by mouth daily.  30 tablet  11  . metFORMIN (GLUCOPHAGE) 1000 MG tablet Take 1 tablet (1,000 mg total) by mouth 2 (two) times daily with a meal.  60 tablet  3  . metoprolol tartrate (LOPRESSOR) 25 MG tablet Take 1 tablet (25 mg total) by mouth 2 (two) times daily.  60 tablet  11  . sildenafil (VIAGRA) 50 MG tablet Take 1 tablet (50 mg total) by mouth daily as needed for erectile dysfunction.  5 tablet  2   No current facility-administered medications for this visit.   Family History  Problem Relation Age of Onset  . Diabetes Mother   . Coronary artery disease Mother     HAS PACEMAKER  . Heart disease Mother   . Heart attack Brother    History   Social History  . Marital Status: Married    Spouse Name: N/A    Number of Children: N/A  . Years of Education: N/A   Social History Main Topics  . Smoking status: Former Smoker    Quit date: 07/06/2007  . Smokeless tobacco: Never Used  . Alcohol Use: No  .  Drug Use: No  . Sexually Active: None   Other Topics Concern  . None   Social History Narrative  . None    Review of Systems: Constitutional: Denies fever, chills, diaphoresis, appetite change and fatigue.  Respiratory: Reports DOE without chest pain or palpitations. Denies cough, chest tightness, and wheezing.  Cardiovascular: No chest pain, and leg swelling.  Gastrointestinal: No abdominal pain, nausea, vomiting, bloody stools Genitourinary: No dysuria, frequency, hematuria, or flank pain.     Objective:  Physical Exam: Filed Vitals:   12/05/12 1500  BP: 178/98  Pulse: 87  Temp: 97.7 F (36.5 C)  TempSrc: Oral  Height: 5\' 6"  (1.676 m)  Weight: 240 lb 11.2 oz (109.181 kg)  SpO2: 98%   General: Well nourished. No acute distress.  Lungs: CTA bilaterally. Heart: RRR; no  extra sounds or murmurs  Abdomen: Non-distended, normal BS, soft, nontender; no hepatosplenomegaly  Extremities: No pedal edema. Right ankle joint appears slightly swollen with increased tenderness on palpation. No erythema, or increased warmth. No evidence of effusions. Neurologic: Alert and oriented x3. No obvious neurologic deficits.  Assessment & Plan:  I have discussed my assessment and plan  with Dr. Aundria Rud  as detailed under problem based charting.

## 2012-12-05 NOTE — Patient Instructions (Signed)
General Instructions: Please start taking allopurinol 100 mg once daily. Please pick a prescription of colchicine 0.6 mg twice a day. Please pick your prescription of Flexeril and Lipitor. Will contact her daughter to see if you can get a blood glucose meter so that you can take your blood sugars at home. If your pain becomes unbearable please call the clinic.   Treatment Goals:  Goals (1 Years of Data) as of 12/05/12         As of Today 10/09/12 08/01/12 07/11/12 06/29/12     Blood Pressure    . Blood Pressure < 140/90  178/98  127/78 118/78 120/80    . Blood Pressure < 140/90  178/98  127/78 118/78 120/80     Result Component    . HEMOGLOBIN A1C < 7.0  6.8        . LDL CALC < 100   145       . LDL CALC < 130   145        Weight    . Weight < 200 lb (90.719 kg)  240 lb 11.2 oz (109.181 kg)  227 lb 6.4 oz (103.148 kg) 229 lb (103.874 kg) 231 lb 12.8 oz (105.144 kg)      Progress Toward Treatment Goals:  Treatment Goal 12/05/2012  Hemoglobin A1C improved  Blood pressure -    Self Care Goals & Plans:  Self Care Goal 12/05/2012  Manage my medications bring my medications to every visit  Monitor my health keep track of my blood glucose  Eat healthy foods -  Be physically active -    Home Blood Glucose Monitoring 12/05/2012  Check my blood sugar 2 times a day  When to check my blood sugar before breakfast; before dinner     Care Management & Community Referrals:  Referral 12/05/2012  Referrals made for care management support diabetes educator

## 2012-12-05 NOTE — Assessment & Plan Note (Signed)
Lab Results  Component Value Date   HGBA1C 6.8 12/05/2012   HGBA1C 7.2 06/12/2012   HGBA1C 7.1 01/16/2012     Assessment: Diabetes control: good control (HgbA1C at goal) Progress toward A1C goal:  improved Comments:   Plan: Medications:  continue current medications Home glucose monitoring: Frequency: 2 times a day Timing: before breakfast;before dinner Instruction/counseling given: reminded to bring medications to each visit Educational resources provided:   Self management tools provided:   Other plans: Refilled his prescription of metformin.

## 2012-12-05 NOTE — Telephone Encounter (Signed)
Called [facility]- testing supplies are not covered without dx code and frequency. Note Freestyle is preferred meter

## 2012-12-06 NOTE — Progress Notes (Signed)
Case discussed with Dr. Kazibwe soon after the resident saw the patient.  We reviewed the resident's history and exam and pertinent patient test results.  I agree with the assessment, diagnosis, and plan of care documented in the resident's note. 

## 2013-02-06 ENCOUNTER — Encounter: Payer: Self-pay | Admitting: Internal Medicine

## 2013-02-06 ENCOUNTER — Ambulatory Visit (INDEPENDENT_AMBULATORY_CARE_PROVIDER_SITE_OTHER): Payer: Medicare Other | Admitting: Internal Medicine

## 2013-02-06 VITALS — BP 136/87 | HR 81 | Temp 97.0°F | Ht 67.0 in | Wt 233.3 lb

## 2013-02-06 DIAGNOSIS — I10 Essential (primary) hypertension: Secondary | ICD-10-CM

## 2013-02-06 DIAGNOSIS — Z0000 Encounter for general adult medical examination without abnormal findings: Secondary | ICD-10-CM

## 2013-02-06 DIAGNOSIS — E785 Hyperlipidemia, unspecified: Secondary | ICD-10-CM

## 2013-02-06 DIAGNOSIS — E669 Obesity, unspecified: Secondary | ICD-10-CM

## 2013-02-06 DIAGNOSIS — D509 Iron deficiency anemia, unspecified: Secondary | ICD-10-CM

## 2013-02-06 DIAGNOSIS — E1149 Type 2 diabetes mellitus with other diabetic neurological complication: Secondary | ICD-10-CM

## 2013-02-06 DIAGNOSIS — M109 Gout, unspecified: Secondary | ICD-10-CM

## 2013-02-06 DIAGNOSIS — Z23 Encounter for immunization: Secondary | ICD-10-CM

## 2013-02-06 LAB — BASIC METABOLIC PANEL WITH GFR
Calcium: 10 mg/dL (ref 8.4–10.5)
Creat: 1.05 mg/dL (ref 0.50–1.35)
GFR, Est Non African American: 79 mL/min

## 2013-02-06 LAB — GLUCOSE, CAPILLARY: Glucose-Capillary: 102 mg/dL — ABNORMAL HIGH (ref 70–99)

## 2013-02-06 LAB — IRON AND TIBC
%SAT: 18 % — ABNORMAL LOW (ref 20–55)
Iron: 65 ug/dL (ref 42–165)
TIBC: 359 ug/dL (ref 215–435)
UIBC: 294 ug/dL (ref 125–400)

## 2013-02-06 LAB — POCT GLYCOSYLATED HEMOGLOBIN (HGB A1C): Hemoglobin A1C: 7

## 2013-02-06 LAB — FERRITIN: Ferritin: 318 ng/mL (ref 22–322)

## 2013-02-06 MED ORDER — DOCUSATE SODIUM 100 MG PO CAPS: 100.0000 mg | ORAL_CAPSULE | Freq: Two times a day (BID) | ORAL | Status: DC

## 2013-02-06 MED ORDER — FERROUS SULFATE 325 (65 FE) MG PO TABS: 325.0000 mg | ORAL_TABLET | Freq: Three times a day (TID) | ORAL | Status: DC

## 2013-02-06 NOTE — Assessment & Plan Note (Addendum)
Lab Results  Component Value Date   HGBA1C 6.8 12/05/2012   HGBA1C 7.2 06/12/2012   HGBA1C 7.1 01/16/2012     Assessment: Diabetes control: good control (HgbA1C at goal) Progress toward A1C goal:  at goal Comments: compliant with metformin  Plan: Medications:  continue current medications Home glucose monitoring: Frequency:   Timing:   Instruction/counseling given: discussed the need for weight loss Educational resources provided: brochure Self management tools provided:   Other plans: Unfortunately, the patient cannot tolerate statins. Will continue with metformin. No need for home monitoring. Discussed about weight loss and health diet.  Will check a1c today

## 2013-02-06 NOTE — Assessment & Plan Note (Addendum)
Pt has no anemia symptoms today but noted Hgb 12 and low MCV. He denies recent melena or bloody stools. Colonoscopy not done due to cost.   Plan Started on iron sulfate with prn colace - both otc Will order iron panel Will recheck in 3 months

## 2013-02-06 NOTE — Patient Instructions (Addendum)
General Instructions: We will check your labs today and I will give you call if there are of concern  Please start taking iron pills due to your low blood levels. This medication can cause constipation. You can use Colace as needed. Both these are available over the counter.  Please follow up with your heart doctors Please refill your medications on time  Otherwise follow in 3 months.     Treatment Goals:  Goals (1 Years of Data) as of 02/06/13         As of Today 12/05/12 10/09/12 08/01/12 07/11/12     Blood Pressure    . Blood Pressure < 140/90  141/90 178/98  127/78 118/78    . Blood Pressure < 140/90  141/90 178/98  127/78 118/78     Result Component    . HEMOGLOBIN A1C < 7.0   6.8       . LDL CALC < 100    145      . LDL CALC < 130    145       Weight    . Weight < 200 lb (90.719 kg)  233 lb 4.8 oz (105.824 kg) 240 lb 11.2 oz (109.181 kg)  227 lb 6.4 oz (103.148 kg) 229 lb (103.874 kg)      Progress Toward Treatment Goals:  Treatment Goal 02/06/2013  Hemoglobin A1C at goal  Blood pressure improved    Self Care Goals & Plans:  Self Care Goal 02/06/2013  Manage my medications take my medicines as prescribed; bring my medications to every visit; refill my medications on time  Monitor my health keep track of my weight  Eat healthy foods eat foods that are low in salt; eat baked foods instead of fried foods  Be physically active take a walk every day; find an activity I enjoy    Home Blood Glucose Monitoring 12/05/2012  Check my blood sugar 2 times a day  When to check my blood sugar before breakfast; before dinner     Care Management & Community Referrals:  Referral 12/05/2012  Referrals made for care management support diabetes educator

## 2013-02-06 NOTE — Assessment & Plan Note (Signed)
BP Readings from Last 3 Encounters:  02/06/13 136/87  12/05/12 178/98  08/01/12 127/78    Lab Results  Component Value Date   NA 134* 05/29/2012   K 4.7 05/29/2012   CREATININE 1.15 05/29/2012    Assessment: Blood pressure control: controlled Progress toward BP goal:  improved Comments:   Plan: Medications:  continue current medications Educational resources provided: brochure Self management tools provided: home blood pressure logbook Other plans: emphasized to him to refill his medications on time  Will follow up in 3 months

## 2013-02-06 NOTE — Progress Notes (Signed)
Patient ID: Daniel Perkins, male   DOB: 1955/06/01, 57 y.o.   MRN: [id number]   Subjective:   HPI: [name] is a 57 y.o. with past medical history of diabetes type II, hypertension, hyperlipidemia, chronic low back pain, coronary artery disease and gout presents to the clinic for routine followup visit. He has no specific complaints today.  DM2, well controlled -  Lab Results  Component Value Date   HGBA1C 6.8 12/05/2012   Patient checking blood sugars 0 times daily. Currently taking  metformin, 1000 mg twice a day . Patient misses doses 0 x per week on average.  0 hypoglycemic episodes since last visit. Denies assisted hypoglycemia or recently hospitalizations for either hyper or hypoglycemia. denies polyuria, polydipsia, nausea, vomiting, diarrhea.  does not request refills today.  In regards to diabetic complications:  Microvascular complications: Confirms: none ; Denies Macrovascular complications: Confirms: none  Important diabetic medications: Is patient on aspirin? Yes Is patient on a statin? No. Due to intolerance.  Is patient on an ACE-I/ ARB? Yes  In terms of his hypertension, he reports that he has been out of his lisinopril-HCTZ since yesterday. He has refills at the pharmacy, which he has not picked up. He continues taking his metoprolol. I encouraged him for his refill his medications on time.    Kindly see the A&P for the status of the pt's chronic medical problems.   Past Medical History  Diagnosis Date  . Coronary artery disease     Holter monitor 02/2012 - sinus with rare PVC  . Gout   . Hyperlipidemia   . Hypertension   . Osteomyelitis   . CTEV (congenital talipes equinovarus)   . Diabetes mellitus without complication    Current Outpatient Prescriptions  Medication Sig Dispense Refill  . allopurinol (ZYLOPRIM) 100 MG tablet Take 1 tablet (100 mg total) by mouth daily.  30 tablet  2  . aspirin EC 81 MG tablet Take 1 tablet (81 mg total) by  mouth daily.  150 tablet  2  . Blood Glucose Monitoring Suppl (FREESTYLE LITE) DEVI Use to check blood sugar up to 1 time a day dx code 250.00 non insulin requiring  1 each  0  . docusate sodium (COLACE) 100 MG capsule Take 1 capsule (100 mg total) by mouth 2 (two) times daily.  10 capsule  3  . ferrous sulfate 325 (65 FE) MG tablet Take 1 tablet (325 mg total) by mouth 3 (three) times daily with meals.  30 tablet  3  . glucose blood (FREESTYLE LITE) test strip Use to check blood sugar up to 1 time a day dx code 250.00 non insulin requiring  100 each  12  . Lancet Devices MISC 1 kit by Does not apply route 3 (three) times daily.  100 each  11  . Lancets 30G MISC Use up to 1 a day to check blood sugar dx code 250.00 non insulin requiring  100 each  5  . lisinopril-hydrochlorothiazide (PRINZIDE,ZESTORETIC) 20-25 MG per tablet Take 1 tablet by mouth daily.  30 tablet  11  . metFORMIN (GLUCOPHAGE) 1000 MG tablet Take 1 tablet (1,000 mg total) by mouth 2 (two) times daily with a meal.  60 tablet  3  . metoprolol tartrate (LOPRESSOR) 25 MG tablet Take 1 tablet (25 mg total) by mouth 2 (two) times daily.  60 tablet  11  . sildenafil (VIAGRA) 50 MG tablet Take 1 tablet (50 mg total) by mouth daily as needed for erectile dysfunction.  5 tablet  2  . colchicine 0.6 MG tablet Take 1 tablet (0.6 mg total) by mouth 2 (two) times daily.  30 tablet  0  . cyclobenzaprine (FLEXERIL) 5 MG tablet Take 1 tablet (5 mg total) by mouth 3 (three) times daily as needed. Muscle spasms  30 tablet  2   No current facility-administered medications for this visit.   Family History  Problem Relation Age of Onset  . Diabetes Mother   . Coronary artery disease Mother     HAS PACEMAKER  . Heart disease Mother   . Heart attack Brother    History   Social History  . Marital Status: Married    Spouse Name: N/A    Number of Children: N/A  . Years of Education: N/A   Social History Main Topics  . Smoking status: Former  Smoker    Quit date: 07/06/2007  . Smokeless tobacco: Never Used  . Alcohol Use: No  . Drug Use: No  . Sexual Activity: None   Other Topics Concern  . None   Social History Narrative  . None   Review of Systems: Constitutional: Denies fever, chills, diaphoresis, appetite change and fatigue.  Respiratory: Denies SOB, DOE, cough, chest tightness, and wheezing.  Cardiovascular: No chest pain, palpitations and leg swelling. Reports on and off dyspnea on exertion. Gastrointestinal: No abdominal pain, nausea, vomiting, bloody stools Genitourinary: No dysuria, frequency, hematuria, or flank pain.  Musculoskeletal: chronic pain in his legs.    Objective:  Physical Exam: Filed Vitals:   02/06/13 0901 02/06/13 0926  BP: 141/90 136/87  Pulse: 86 81  Temp: 97 F (36.1 C)   TempSrc: Oral   Height: 5\' 7"  (1.702 m)   Weight: 233 lb 4.8 oz (105.824 kg)   SpO2: 97%    General: Well nourished. No acute distress.  Lungs: CTA bilaterally. Heart: RRR; no extra sounds or murmurs  Abdomen: Non-distended, normal BS, soft, nontender; no hepatosplenomegaly  Extremities: No pedal edema. No joint swelling or tenderness. Neurologic: Alert and oriented x3. No obvious neurologic deficits.  Assessment & Plan:  I have discussed my assessment and plan  with Dr. Dalphine Handing  as detailed under problem based charting.

## 2013-02-06 NOTE — Assessment & Plan Note (Addendum)
Unable to afford eye exam co-pay is. Referred in 06/2012  FOBT negative in 08/2012. Eye exam - not done due to cost Update foot exam and flu shot

## 2013-02-06 NOTE — Assessment & Plan Note (Signed)
Not taking colchicine. No recent flares. Plan  Will d/c colchicine  Contin with Allupurinol  Do BMET

## 2013-02-11 NOTE — Progress Notes (Signed)
Case discussed with Dr.Kazibwe at the time of the visit.  We reviewed the resident's history and exam and pertinent patient test results.  I agree with the assessment, diagnosis, and plan of care documented in the resident's note.    

## 2013-05-22 ENCOUNTER — Encounter: Payer: Medicare Other | Admitting: Internal Medicine

## 2013-05-29 ENCOUNTER — Encounter: Payer: Medicare Other | Admitting: Internal Medicine

## 2013-05-29 ENCOUNTER — Encounter: Payer: Self-pay | Admitting: Internal Medicine

## 2013-07-10 ENCOUNTER — Ambulatory Visit: Payer: Medicare Other | Admitting: Dietician

## 2013-07-10 ENCOUNTER — Ambulatory Visit (INDEPENDENT_AMBULATORY_CARE_PROVIDER_SITE_OTHER): Payer: Medicare Other | Admitting: Internal Medicine

## 2013-07-10 ENCOUNTER — Encounter: Payer: Self-pay | Admitting: Internal Medicine

## 2013-07-10 VITALS — BP 130/80 | HR 90 | Temp 97.0°F | Ht 67.0 in | Wt 238.6 lb

## 2013-07-10 DIAGNOSIS — M109 Gout, unspecified: Secondary | ICD-10-CM

## 2013-07-10 DIAGNOSIS — K089 Disorder of teeth and supporting structures, unspecified: Secondary | ICD-10-CM

## 2013-07-10 DIAGNOSIS — K0889 Other specified disorders of teeth and supporting structures: Secondary | ICD-10-CM

## 2013-07-10 DIAGNOSIS — I10 Essential (primary) hypertension: Secondary | ICD-10-CM

## 2013-07-10 DIAGNOSIS — Z0000 Encounter for general adult medical examination without abnormal findings: Secondary | ICD-10-CM

## 2013-07-10 DIAGNOSIS — E119 Type 2 diabetes mellitus without complications: Secondary | ICD-10-CM

## 2013-07-10 DIAGNOSIS — I2510 Atherosclerotic heart disease of native coronary artery without angina pectoris: Secondary | ICD-10-CM

## 2013-07-10 DIAGNOSIS — E1149 Type 2 diabetes mellitus with other diabetic neurological complication: Secondary | ICD-10-CM

## 2013-07-10 DIAGNOSIS — Z1211 Encounter for screening for malignant neoplasm of colon: Secondary | ICD-10-CM

## 2013-07-10 DIAGNOSIS — E669 Obesity, unspecified: Secondary | ICD-10-CM

## 2013-07-10 LAB — HM DIABETES EYE EXAM

## 2013-07-10 LAB — GLUCOSE, CAPILLARY: GLUCOSE-CAPILLARY: 211 mg/dL — AB (ref 70–99)

## 2013-07-10 LAB — POCT GLYCOSYLATED HEMOGLOBIN (HGB A1C): Hemoglobin A1C: 7.7

## 2013-07-10 MED ORDER — ONETOUCH DELICA LANCETS FINE MISC: Status: DC

## 2013-07-10 MED ORDER — GLIPIZIDE 2.5 MG HALF TABLET: 2.5000 mg | ORAL_TABLET | Freq: Every day | ORAL | Status: DC

## 2013-07-10 MED ORDER — LISINOPRIL-HYDROCHLOROTHIAZIDE 20-25 MG PO TABS: 1.0000 | ORAL_TABLET | Freq: Every day | ORAL | Status: DC

## 2013-07-10 MED ORDER — METOPROLOL TARTRATE 25 MG PO TABS: 25.0000 mg | ORAL_TABLET | Freq: Two times a day (BID) | ORAL | Status: DC

## 2013-07-10 MED ORDER — METFORMIN HCL 1000 MG PO TABS: 1000.0000 mg | ORAL_TABLET | Freq: Two times a day (BID) | ORAL | Status: DC

## 2013-07-10 MED ORDER — GLUCOSE BLOOD VI STRP
ORAL_STRIP | Status: DC
Start: 2013-07-10 — End: 2014-02-10

## 2013-07-10 MED ORDER — ONETOUCH ULTRA 2 W/DEVICE KIT: PACK | Status: DC

## 2013-07-10 MED ORDER — ALLOPURINOL 100 MG PO TABS: 100.0000 mg | ORAL_TABLET | Freq: Every day | ORAL | Status: DC

## 2013-07-10 NOTE — Assessment & Plan Note (Signed)
Wt has remained relatively stable. counseled him about diet and regular exercise.

## 2013-07-10 NOTE — Progress Notes (Signed)
Patient ID: Daniel Perkins, male   DOB: [birth year], 58 y.o.   MRN: [id number]   Subjective:   HPI: Daniel Perkins is a 58 y.o. African American gentleman, with past medical history of CAD, hypertension, diabetes mellitus. Among other medical problems presents to the clinic for routine followup visit.  Tooth pain: Patient reports that last week he started experiencing a sensation of a stuck foreign body between his upper teeth which happened after eating fish. He has been trying to use tooth picks to remove the stuck object but unsuccessfully. Since then, patient has continued to experience pain, but without constitutional symptoms. He has not seen a dentist. His been able to, but without difficulty.  DM2, uncontrolled -  Lab Results  Component Value Date   HGBA1C 7.7 07/10/2013   Patient checking blood sugars 0 times daily. He does not have a meter. Currently taking Metformin 1000 mg bid. Patient misses doses 0 x per week on average.  0 hypoglycemic episodes since last visit. Denies assisted hypoglycemia or recently hospitalizations for either hyper or hypoglycemia. denies polyuria, polydipsia, nausea, vomiting, diarrhea.  does request refills today.   Important diabetic medications: Is patient on aspirin? No Is patient on a statin? No Is patient on an ACE-I/ ARB? Yes    Past Medical History  Diagnosis Date  . Coronary artery disease     Holter monitor 02/2012 - sinus with rare PVC  . Gout   . Hyperlipidemia   . Hypertension   . Osteomyelitis   . CTEV (congenital talipes equinovarus)   . Diabetes mellitus without complication    Current Outpatient Prescriptions  Medication Sig Dispense Refill  . allopurinol (ZYLOPRIM) 100 MG tablet Take 1 tablet (100 mg total) by mouth daily.  30 tablet  2  . lisinopril-hydrochlorothiazide (PRINZIDE,ZESTORETIC) 20-25 MG per tablet Take 1 tablet by mouth daily.  30 tablet  11  . metFORMIN (GLUCOPHAGE) 1000 MG tablet Take 1 tablet (1,000  mg total) by mouth 2 (two) times daily with a meal.  60 tablet  3  . metoprolol tartrate (LOPRESSOR) 25 MG tablet Take 1 tablet (25 mg total) by mouth 2 (two) times daily.  60 tablet  11  . glipiZIDE (GLUCOTROL) 2.5 mg TABS tablet Take 0.5 tablets (2.5 mg total) by mouth daily.  30 tablet  3  . Lancet Devices MISC 1 kit by Does not apply route 3 (three) times daily.  100 each  11   No current facility-administered medications for this visit.   Family History  Problem Relation Age of Onset  . Diabetes Mother   . Coronary artery disease Mother     HAS PACEMAKER  . Heart disease Mother   . Heart attack Brother    History   Social History  . Marital Status: Married    Spouse Name: N/A    Number of Children: N/A  . Years of Education: 13   Occupational History  .    . unemployed    Social History Main Topics  . Smoking status: Former Smoker    Quit date: 07/06/2007  . Smokeless tobacco: Never Used  . Alcohol Use: No  . Drug Use: No  . Sexual Activity: None   Other Topics Concern  . None   Social History Narrative  . None   Review of Systems: Constitutional: Denies fever, chills, diaphoresis, appetite change and fatigue.  Respiratory: Denies SOB, DOE, cough, chest tightness, and wheezing. Denies chest pain. Cardiovascular: No chest pain, palpitations and leg swelling.  Gastrointestinal: No abdominal pain, nausea, vomiting, bloody stools Genitourinary: No dysuria, frequency, hematuria, or flank pain.  Musculoskeletal: complains of pain in the left foot which is chronic for him. Psych: No depression symptoms. No SI or SA.   Objective:  Physical Exam: Filed Vitals:   07/10/13 1521 07/10/13 1603  BP: 152/90 130/80  Pulse: 90   Temp: 97 F (36.1 C)   TempSrc: Oral   Height: _0  (1.702 m)   Weight: 238 lb 9.6 oz (108.228 kg)   SpO2: 97%    General: Well nourished. No acute distress.  HEENT: Normal oral mucosa. MMM. A small are of hyperemia around the right upper  teeth. No signs of infection. Lungs: CTA bilaterally. 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 No obvious neurologic/cranial nerve deficits.  Assessment & Plan:  I have discussed my assessment and plan  with  my attending in the clinic, Dr. Eppie Gibson  as detailed under problem based charting.

## 2013-07-10 NOTE — Progress Notes (Signed)
Case discussed with Dr. Kazibwe soon after the resident saw the patient.  We reviewed the resident's history and exam and pertinent patient test results.  I agree with the assessment, diagnosis and plan of care documented in the resident's note. 

## 2013-07-10 NOTE — Assessment & Plan Note (Signed)
Lab Results  Component Value Date   HGBA1C 7.7 07/10/2013   HGBA1C 7.0 02/06/2013   HGBA1C 6.8 12/05/2012     Assessment: Diabetes control: fair control Progress toward A1C goal:  deteriorated Comments: does not perform home glucose monitoring.  Plan: Medications:  Continue with metformin 1000 mg twice a day. add glipizide 2.5 mg daily. Home glucose monitoring: Frequency: 2 times a day Timing: before breakfast;before dinner Instruction/counseling given: reminded to get eye exam, reminded to bring blood glucose meter & log to each visit, reminded to bring medications to each visit, discussed the need for weight loss and discussed diet Educational resources provided:   Self management tools provided:   Other plans:  1. Foot exam 2. A1c checked today 3. Eye exam to be performed today 4. Refer to [facility] for diabetic education. He will be provided with a new glucometer if possible 5. Followup in one month. Consider increasing the dose of glipizide to 5 mg depending on his blood sugar readings

## 2013-07-10 NOTE — Assessment & Plan Note (Signed)
Will order FOBT today. They were negative last year.

## 2013-07-10 NOTE — Assessment & Plan Note (Signed)
BP Readings from Last 3 Encounters:  07/10/13 130/80  02/06/13 136/87  12/05/12 178/98    Lab Results  Component Value Date   NA 137 02/06/2013   K 5.4* 02/06/2013   CREATININE 1.05 02/06/2013    Assessment: Blood pressure control: mildly elevated Progress toward BP goal:  unchanged Comments: Compliant  Plan: Medications:  continue current medications Educational resources provided:   Self management tools provided:   Other plans: none.

## 2013-07-10 NOTE — Patient Instructions (Signed)
General Instructions: Please see Daniel Perkins today  Please take Glipizide 2.5 mg daily  Please use salt water to linse mouth three times a day  If the pain persist, please see a dentist Please come back in one month with your glucose meter  Treatment Goals:  Goals (1 Years of Data) as of 07/10/13         As of Today As of Today 02/06/13 02/06/13 12/05/12     Blood Pressure    . Blood Pressure < 140/90  130/80 152/90 136/87 141/90 178/98    . Blood Pressure < 140/90  130/80 152/90 136/87 141/90 178/98     Result Component    . HEMOGLOBIN A1C < 7.0  7.7  7.0  6.8    . LDL CALC < 100          . LDL CALC < 130           Weight    . Weight < 200 lb (90.719 kg)  238 lb 9.6 oz (108.228 kg)  233 lb 4.8 oz (105.824 kg)  240 lb 11.2 oz (109.181 kg)      Progress Toward Treatment Goals:  Treatment Goal 07/10/2013  Hemoglobin A1C deteriorated  Blood pressure unchanged    Self Care Goals & Plans:  Self Care Goal 07/10/2013  Manage my medications take my medicines as prescribed; bring my medications to every visit; refill my medications on time  Monitor my health keep track of my blood glucose; bring my glucose meter and log to each visit; keep track of my blood pressure  Eat healthy foods -  Be physically active -    Home Blood Glucose Monitoring 07/10/2013  Check my blood sugar 2 times a day  When to check my blood sugar before breakfast; before dinner     Care Management & Community Referrals:  Referral 07/10/2013  Referrals made for care management support diabetes educator; nutritionist

## 2013-07-10 NOTE — Assessment & Plan Note (Signed)
Assessment: Most likely diagnosis: trauma to his upper gums. No evidence of foreign body. I don't see signs of infection. However, there are some signs of traumatic inflammation.    Plan: 1. Labs/imaging: none 2. Therapy: encouraged him to use warm salty water to water his mouth three times a day.  3. Follow up: If the pain persists, he will need to see a dentist.

## 2013-07-23 ENCOUNTER — Other Ambulatory Visit (INDEPENDENT_AMBULATORY_CARE_PROVIDER_SITE_OTHER): Payer: Medicare Other

## 2013-07-23 DIAGNOSIS — Z1211 Encounter for screening for malignant neoplasm of colon: Secondary | ICD-10-CM

## 2013-07-23 LAB — POC HEMOCCULT BLD/STL (HOME/3-CARD/SCREEN)
Card #2 Fecal Occult Blod, POC: NEGATIVE
FECAL OCCULT BLD: NEGATIVE
Fecal Occult Blood, POC: NEGATIVE

## 2013-07-24 ENCOUNTER — Encounter: Payer: Self-pay | Admitting: Dietician

## 2013-07-24 NOTE — Progress Notes (Signed)
Patient here for eye exam which was completed and transmitted. He is interested in setting up and appointment for MNT for his diabetes/weight management. Patient to make appointment at check out.

## 2013-08-19 ENCOUNTER — Encounter: Payer: Self-pay | Admitting: Internal Medicine

## 2013-08-19 ENCOUNTER — Ambulatory Visit: Payer: Medicare Other | Admitting: Internal Medicine

## 2013-08-22 ENCOUNTER — Emergency Department (HOSPITAL_COMMUNITY): Payer: Medicare Other

## 2013-08-22 ENCOUNTER — Encounter (HOSPITAL_COMMUNITY): Payer: Self-pay | Admitting: Emergency Medicine

## 2013-08-22 ENCOUNTER — Emergency Department (HOSPITAL_COMMUNITY)
Admission: EM | Admit: 2013-08-22 | Discharge: 2013-08-22 | Disposition: A | Discharge status: Home / Self Care | Payer: Medicare Other | Attending: Emergency Medicine | Admitting: Emergency Medicine

## 2013-08-22 DIAGNOSIS — E119 Type 2 diabetes mellitus without complications: Secondary | ICD-10-CM | POA: Insufficient documentation

## 2013-08-22 DIAGNOSIS — Z87768 Personal history of other specified (corrected) congenital malformations of integument, limbs and musculoskeletal system: Secondary | ICD-10-CM | POA: Insufficient documentation

## 2013-08-22 DIAGNOSIS — J40 Bronchitis, not specified as acute or chronic: Secondary | ICD-10-CM

## 2013-08-22 DIAGNOSIS — J209 Acute bronchitis, unspecified: Secondary | ICD-10-CM | POA: Insufficient documentation

## 2013-08-22 DIAGNOSIS — Z87891 Personal history of nicotine dependence: Secondary | ICD-10-CM | POA: Insufficient documentation

## 2013-08-22 DIAGNOSIS — I2510 Atherosclerotic heart disease of native coronary artery without angina pectoris: Secondary | ICD-10-CM | POA: Insufficient documentation

## 2013-08-22 DIAGNOSIS — I10 Essential (primary) hypertension: Secondary | ICD-10-CM | POA: Insufficient documentation

## 2013-08-22 DIAGNOSIS — Z8776 Personal history of (corrected) congenital malformations of integument, limbs and musculoskeletal system: Secondary | ICD-10-CM | POA: Insufficient documentation

## 2013-08-22 DIAGNOSIS — R6889 Other general symptoms and signs: Secondary | ICD-10-CM

## 2013-08-22 DIAGNOSIS — M109 Gout, unspecified: Secondary | ICD-10-CM | POA: Insufficient documentation

## 2013-08-22 DIAGNOSIS — J111 Influenza due to unidentified influenza virus with other respiratory manifestations: Secondary | ICD-10-CM | POA: Insufficient documentation

## 2013-08-22 DIAGNOSIS — R509 Fever, unspecified: Secondary | ICD-10-CM

## 2013-08-22 DIAGNOSIS — Z8739 Personal history of other diseases of the musculoskeletal system and connective tissue: Secondary | ICD-10-CM | POA: Insufficient documentation

## 2013-08-22 DIAGNOSIS — Z79899 Other long term (current) drug therapy: Secondary | ICD-10-CM | POA: Insufficient documentation

## 2013-08-22 LAB — BASIC METABOLIC PANEL
BUN: 9 mg/dL (ref 6–23)
CO2: 25 mEq/L (ref 19–32)
Calcium: 9.4 mg/dL (ref 8.4–10.5)
Chloride: 95 mEq/L — ABNORMAL LOW (ref 96–112)
Creatinine, Ser: 1.05 mg/dL (ref 0.50–1.35)
GFR, EST AFRICAN AMERICAN: 90 mL/min — AB (ref >90)
GFR, EST NON AFRICAN AMERICAN: 78 mL/min — AB (ref >90)
Glucose, Bld: 170 mg/dL — ABNORMAL HIGH (ref 70–99)
Potassium: 3.5 mEq/L — ABNORMAL LOW (ref 3.7–5.3)
SODIUM: 136 meq/L — AB (ref 137–147)

## 2013-08-22 LAB — I-STAT TROPONIN, ED: TROPONIN I, POC: 0.01 ng/mL (ref 0.00–0.08)

## 2013-08-22 LAB — CBC
HCT: 40.9 % (ref 39.0–52.0)
Hemoglobin: 13.3 g/dL (ref 13.0–17.0)
MCH: 22.7 pg — ABNORMAL LOW (ref 26.0–34.0)
MCHC: 32.5 g/dL (ref 30.0–36.0)
MCV: 69.9 fL — ABNORMAL LOW (ref 78.0–100.0)
Platelets: 285 10*3/uL (ref 150–400)
RBC: 5.85 MIL/uL — ABNORMAL HIGH (ref 4.22–5.81)
RDW: 14.6 % (ref 11.5–15.5)
WBC: 7.9 10*3/uL (ref 4.0–10.5)

## 2013-08-22 MED ORDER — DEXTROSE 5 % IV SOLN
1.0000 g | Freq: Once | INTRAVENOUS | Status: AC
  Administered 2013-08-22: 1 g via INTRAVENOUS
  Filled 2013-08-22: qty 10

## 2013-08-22 MED ORDER — ACETAMINOPHEN 325 MG PO TABS
650.0000 mg | ORAL_TABLET | Freq: Once | ORAL | Status: AC
  Administered 2013-08-22: 650 mg via ORAL
  Filled 2013-08-22: qty 2

## 2013-08-22 MED ORDER — ALBUTEROL SULFATE HFA 108 (90 BASE) MCG/ACT IN AERS: 1.0000 | INHALATION_SPRAY | Freq: Four times a day (QID) | RESPIRATORY_TRACT | Status: DC | PRN

## 2013-08-22 MED ORDER — AZITHROMYCIN 250 MG PO TABS: 250.0000 mg | ORAL_TABLET | Freq: Every day | ORAL | Status: DC

## 2013-08-22 MED ORDER — DM-GUAIFENESIN ER 30-600 MG PO TB12: 1.0000 | ORAL_TABLET | Freq: Two times a day (BID) | ORAL | Status: DC

## 2013-08-22 MED ORDER — SODIUM CHLORIDE 0.9 % IV BOLUS (SEPSIS)
500.0000 mL | Freq: Once | INTRAVENOUS | Status: AC
  Administered 2013-08-22: 500 mL via INTRAVENOUS

## 2013-08-22 MED ORDER — SODIUM CHLORIDE 0.9 % IV SOLN: INTRAVENOUS | Status: DC

## 2013-08-22 MED ORDER — IPRATROPIUM-ALBUTEROL 0.5-2.5 (3) MG/3ML IN SOLN
3.0000 mL | Freq: Once | RESPIRATORY_TRACT | Status: AC
  Administered 2013-08-22: 3 mL via RESPIRATORY_TRACT
  Filled 2013-08-22: qty 3

## 2013-08-22 NOTE — ED Provider Notes (Signed)
CSN: [id number]     Arrival date & time 08/22/13  1516 History   First MD Initiated Contact with Patient 08/22/13 1608     Chief Complaint  Patient presents with  . Chest Pain     (Consider location/radiation/quality/duration/timing/severity/associated sxs/prior Treatment) Patient is a 58 y.o. male presenting with chest pain. The history is provided by the patient.  Chest Pain Associated symptoms: cough, fever and shortness of breath   Associated symptoms: no abdominal pain, no headache, no nausea and not vomiting    patient with a history of cough congestion pleuritic chest pain worse with cough chest pain substernal. The cough chest pain goes to 10 out of 10 no nausea no vomiting chills and feeling hot. No belly pain cough is productive yellow-green sputum. All symptoms been going on since Sunday.  Past Medical History  Diagnosis Date  . Coronary artery disease     Holter monitor 02/2012 - sinus with rare PVC  . Gout   . Hyperlipidemia   . Hypertension   . Osteomyelitis   . CTEV (congenital talipes equinovarus)   . Diabetes mellitus without complication    Past Surgical History  Procedure Laterality Date  . Neck surgery    . Foot surgery  x16    Both feet   Family History  Problem Relation Age of Onset  . Diabetes Mother   . Coronary artery disease Mother     HAS PACEMAKER  . Heart disease Mother   . Heart attack Brother    History  Substance Use Topics  . Smoking status: Former Smoker    Quit date: 07/06/2007  . Smokeless tobacco: Never Used  . Alcohol Use: No    Review of Systems  Constitutional: Positive for fever and chills.  HENT: Positive for congestion, sinus pressure and sore throat.   Eyes: Positive for redness. Negative for itching.  Respiratory: Positive for cough and shortness of breath.   Cardiovascular: Positive for chest pain.  Gastrointestinal: Negative for nausea, vomiting, abdominal pain and diarrhea.  Genitourinary: Negative for dysuria.   Musculoskeletal: Positive for myalgias.  Allergic/Immunologic: Negative for environmental allergies.  Neurological: Negative for headaches.  Hematological: Does not bruise/bleed easily.  Psychiatric/Behavioral: Negative for confusion.      Allergies  Morphine and Pravastatin sodium  Home Medications   Prior to Admission medications   Medication Sig Start Date End Date Taking? Authorizing Provider  allopurinol (ZYLOPRIM) 100 MG tablet Take 1 tablet (100 mg total) by mouth daily. 07/10/13   Jessee Avers, MD  Blood Glucose Monitoring Suppl (ONE TOUCH ULTRA 2) W/DEVICE KIT Check blood sugar twice daily as instructed dx code 250.00 07/10/13   Jessee Avers, MD  glipiZIDE (GLUCOTROL) 2.5 mg TABS tablet Take 0.5 tablets (2.5 mg total) by mouth daily. 07/10/13 07/10/14  Jessee Avers, MD  glucose blood (ONE TOUCH ULTRA TEST) test strip Check blood sugar twice daily as instructed dx code 250.00 07/10/13   Jessee Avers, MD  Lancet Devices MISC 1 kit by Does not apply route 3 (three) times daily. 06/29/12   Jessee Avers, MD  lisinopril-hydrochlorothiazide (PRINZIDE,ZESTORETIC) 20-25 MG per tablet Take 1 tablet by mouth daily. 07/10/13   Jessee Avers, MD  metFORMIN (GLUCOPHAGE) 1000 MG tablet Take 1 tablet (1,000 mg total) by mouth 2 (two) times daily with a meal. 07/10/13   Jessee Avers, MD  metoprolol tartrate (LOPRESSOR) 25 MG tablet Take 1 tablet (25 mg total) by mouth 2 (two) times daily. 07/10/13 07/10/14  Jessee Avers, MD  [facility]  DELICA LANCETS FINE MISC Check blood sugar twice daily as instructed dx code 250.00 07/10/13   Jessee Avers, MD   BP 150/99  Pulse 101  Temp(Src) 99.8 F (37.7 C) (Oral)  Resp 23  Ht '5\' 8"'  (1.727 m)  Wt 247 lb (112.038 kg)  BMI 37.56 kg/m2  SpO2 93% Physical Exam  Nursing note and vitals reviewed. Constitutional: He is oriented to person, place, and time. He appears well-developed and well-nourished. No distress.  HENT:  Head: Normocephalic and  atraumatic.  Mouth/Throat: Oropharynx is clear and moist.  Eyes: Conjunctivae and EOM are normal. Pupils are equal, round, and reactive to light.  Neck: Normal range of motion.  Cardiovascular: Normal rate, regular rhythm and normal heart sounds.   Pulmonary/Chest: Effort normal and breath sounds normal.  Abdominal: Soft. Bowel sounds are normal. There is no tenderness.  Musculoskeletal: Normal range of motion.  Neurological: He is alert and oriented to person, place, and time. No cranial nerve deficit. He exhibits normal muscle tone. Coordination normal.  Skin: Skin is warm. No rash noted.    ED Course  Procedures (including critical care time) Labs Review Labs Reviewed  CBC - Abnormal; Notable for the following:    RBC 5.85 (*)    MCV 69.9 (*)    MCH 22.7 (*)    All other components within normal limits  BASIC METABOLIC PANEL - Abnormal; Notable for the following:    Sodium 136 (*)    Potassium 3.5 (*)    Chloride 95 (*)    Glucose, Bld 170 (*)    GFR calc non Af Amer 78 (*)    GFR calc Af Amer 90 (*)    All other components within normal limits  I-STAT TROPOININ, ED   Results for orders placed during the hospital encounter of 08/22/13  CBC      Result Value Ref Range   WBC 7.9  4.0 - 10.5 K/uL   RBC 5.85 (*) 4.22 - 5.81 MIL/uL   Hemoglobin 13.3  13.0 - 17.0 g/dL   HCT 40.9  39.0 - 52.0 %   MCV 69.9 (*) 78.0 - 100.0 fL   MCH 22.7 (*) 26.0 - 34.0 pg   MCHC 32.5  30.0 - 36.0 g/dL   RDW 14.6  11.5 - 15.5 %   Platelets 285  150 - 400 K/uL  BASIC METABOLIC PANEL      Result Value Ref Range   Sodium 136 (*) 137 - 147 mEq/L   Potassium 3.5 (*) 3.7 - 5.3 mEq/L   Chloride 95 (*) 96 - 112 mEq/L   CO2 25  19 - 32 mEq/L   Glucose, Bld 170 (*) 70 - 99 mg/dL   BUN 9  6 - 23 mg/dL   Creatinine, Ser 1.05  0.50 - 1.35 mg/dL   Calcium 9.4  8.4 - 10.5 mg/dL   GFR calc non Af Amer 78 (*) >90 mL/min   GFR calc Af Amer 90 (*) >90 mL/min  I-STAT TROPOININ, ED      Result Value Ref  Range   Troponin i, poc 0.01  0.00 - 0.08 ng/mL   Comment 3              Imaging Review Dg Chest 2 View  08/22/2013   CLINICAL DATA:  Chest pain, cough  EXAM: CHEST  2 VIEW  COMPARISON:  05/29/2012  FINDINGS: Stable mild bronchitic hand interstitial prominence. Normal heart size. No definite pneumonia or CHF. Negative for effusion or  pneumothorax. Cervical fusion hardware noted. Trachea is midline. Degenerative changes noted of the thoracic spine.  IMPRESSION: Chronic bronchitic and interstitial changes. No definite superimposed acute process.   Electronically Signed   By: Daryll Brod M.D.   On: 08/22/2013 16:08     EKG Interpretation None      Date: 08/22/2013  Rate: 117  Rhythm: sinus tachycardia  QRS Axis: left  Intervals: normal  ST/T Wave abnormalities: nonspecific T wave changes  Conduction Disutrbances:left anterior fascicular block  Narrative Interpretation:   Old EKG Reviewed: none available       MDM   Final diagnoses:  Bronchitis  Fever  Flu-like symptoms    Patient with febrile illness congestion cough productive cough sinus pressure over the right for head area. Sore throat for the past week. Chest x-rays negative for pneumonia patient's chest pain seems to be chest wall in nature worse with cough. It's also been present for a week. Patient's troponin here today is negative. Suspect a bronchitis since no evidence of pneumonia pulmonary edema or pneumothorax chest x-ray. No significant leukocytosis. Patient's blood sugars are fairly well-controlled at the 170 despite his diabetes. Will treat symptomatically. Patient given IV fluids here to hydrate him since his been going on for a week. Patient given a dose of Rocephin just in case there is a bacterial component to the bronchitis or sinusitis. Patient with this charge to home on Zithromax. Patient also given albuterol nebulizer to see if it helps open his lungs no wheezing was heard clinically patient we discharged  home with albuterol the inhaler and Mucinex DM.    Mervin Kung, MD 08/22/13 [phone]

## 2013-08-22 NOTE — Discharge Instructions (Signed)
Return for any new or worse symptoms. Use albuterol inhaler every 6 hours for the next 7 days. Take the antibiotic as directed. Use Mucinex DM as needed for cough. Albuterol inhaler help with cough as well. Tylenol for fever. Return for any new or worse symptoms. Chest x-ray was negative for pneumonia.

## 2013-08-22 NOTE — ED Notes (Signed)
Pt reports cough and vomit with blood, chest pain, sharp pains in head, getting cold, watery eyes, sore throat for past week. He is a&ox4, resp re/u

## 2013-10-01 ENCOUNTER — Encounter: Payer: Self-pay | Admitting: Internal Medicine

## 2013-10-02 ENCOUNTER — Ambulatory Visit (HOSPITAL_COMMUNITY)
Admission: RE | Admit: 2013-10-02 | Discharge: 2013-10-02 | Disposition: A | Discharge status: Home / Self Care | Payer: Medicare Other | Source: Ambulatory Visit | Attending: Internal Medicine | Admitting: Internal Medicine

## 2013-10-02 ENCOUNTER — Ambulatory Visit (INDEPENDENT_AMBULATORY_CARE_PROVIDER_SITE_OTHER): Payer: Medicare Other | Admitting: Internal Medicine

## 2013-10-02 ENCOUNTER — Encounter: Payer: Self-pay | Admitting: Internal Medicine

## 2013-10-02 VITALS — BP 159/96 | HR 106 | Temp 98.2°F | Ht 67.0 in | Wt 226.5 lb

## 2013-10-02 DIAGNOSIS — E1149 Type 2 diabetes mellitus with other diabetic neurological complication: Secondary | ICD-10-CM

## 2013-10-02 DIAGNOSIS — R0602 Shortness of breath: Secondary | ICD-10-CM

## 2013-10-02 DIAGNOSIS — R252 Cramp and spasm: Secondary | ICD-10-CM | POA: Insufficient documentation

## 2013-10-02 DIAGNOSIS — I10 Essential (primary) hypertension: Secondary | ICD-10-CM

## 2013-10-02 DIAGNOSIS — Z0000 Encounter for general adult medical examination without abnormal findings: Secondary | ICD-10-CM

## 2013-10-02 DIAGNOSIS — I2510 Atherosclerotic heart disease of native coronary artery without angina pectoris: Secondary | ICD-10-CM

## 2013-10-02 LAB — COMPLETE METABOLIC PANEL WITH GFR
ALK PHOS: 91 U/L (ref 39–117)
ALT: 32 U/L (ref 0–53)
AST: 37 U/L (ref 0–37)
Albumin: 4.2 g/dL (ref 3.5–5.2)
BUN: 19 mg/dL (ref 6–23)
CO2: 25 mEq/L (ref 19–32)
Calcium: 9.6 mg/dL (ref 8.4–10.5)
Chloride: 101 mEq/L (ref 96–112)
Creat: 1.14 mg/dL (ref 0.50–1.35)
GFR, Est African American: 83 mL/min
GFR, Est Non African American: 71 mL/min
Glucose, Bld: 120 mg/dL — ABNORMAL HIGH (ref 70–99)
Potassium: 4.7 mEq/L (ref 3.5–5.3)
Sodium: 136 mEq/L (ref 135–145)
Total Bilirubin: 0.4 mg/dL (ref 0.2–1.2)
Total Protein: 7.7 g/dL (ref 6.0–8.3)

## 2013-10-02 LAB — CK: Total CK: 479 U/L — ABNORMAL HIGH (ref 7–232)

## 2013-10-02 LAB — CBC
HEMATOCRIT: 42.9 % (ref 39.0–52.0)
Hemoglobin: 13.9 g/dL (ref 13.0–17.0)
MCH: 21.8 pg — ABNORMAL LOW (ref 26.0–34.0)
MCHC: 32.4 g/dL (ref 30.0–36.0)
MCV: 67.2 fL — ABNORMAL LOW (ref 78.0–100.0)
Platelets: 355 10*3/uL (ref 150–400)
RBC: 6.38 MIL/uL — ABNORMAL HIGH (ref 4.22–5.81)
RDW: 15.2 % (ref 11.5–15.5)
WBC: 6.6 10*3/uL (ref 4.0–10.5)

## 2013-10-02 LAB — POCT GLYCOSYLATED HEMOGLOBIN (HGB A1C): HEMOGLOBIN A1C: 7.7

## 2013-10-02 LAB — GLUCOSE, CAPILLARY: Glucose-Capillary: 123 mg/dL — ABNORMAL HIGH (ref 70–99)

## 2013-10-02 MED ORDER — GLIPIZIDE 5 MG PO TABS: 5.0000 mg | ORAL_TABLET | Freq: Every day | ORAL | Status: DC

## 2013-10-02 MED ORDER — GLIPIZIDE 5 MG PO TABS: 2.5000 mg | ORAL_TABLET | Freq: Every day | ORAL | Status: DC

## 2013-10-02 MED ORDER — METHOCARBAMOL 500 MG PO TABS: 1000.0000 mg | ORAL_TABLET | Freq: Three times a day (TID) | ORAL | Status: DC | PRN

## 2013-10-02 NOTE — Patient Instructions (Signed)
General Instructions: Please take Robaxin 1g three times a day as needed for pain  I will call you with results from your blood check  Please pick your medications from the pharmacy   Treatment Goals:  Goals (1 Years of Data) as of 10/02/13         As of Today 08/22/13 08/22/13 08/22/13 08/22/13     Blood Pressure    . Blood Pressure < 140/90  159/96 157/79 150/99 140/78 161/77    . Blood Pressure < 140/90  159/96 157/79 150/99 140/78 161/77     Result Component    . HEMOGLOBIN A1C < 7.0  7.7        . LDL CALC < 100          . LDL CALC < 130           Weight    . Weight < 200 lb (90.719 kg)  226 lb 8 oz (102.74 kg) 247 lb (112.038 kg)         Progress Toward Treatment Goals:  Treatment Goal 10/02/2013  Hemoglobin A1C unchanged  Blood pressure at goal    Self Care Goals & Plans:  Self Care Goal 10/02/2013  Manage my medications take my medicines as prescribed; bring my medications to every visit; refill my medications on time; follow the sick day instructions if I am sick  Monitor my health -  Eat healthy foods -  Be physically active -    Home Blood Glucose Monitoring 10/02/2013  Check my blood sugar once a day  When to check my blood sugar before breakfast     Care Management & Community Referrals:  Referral 07/10/2013  Referrals made for care management support diabetes educator; nutritionist

## 2013-10-03 ENCOUNTER — Telehealth: Payer: Self-pay | Admitting: *Deleted

## 2013-10-03 DIAGNOSIS — R252 Cramp and spasm: Secondary | ICD-10-CM | POA: Insufficient documentation

## 2013-10-03 LAB — C-REACTIVE PROTEIN: CRP: 1.6 mg/dL — AB (ref <0.60)

## 2013-10-03 LAB — MAGNESIUM: MAGNESIUM: 2 mg/dL (ref 1.5–2.5)

## 2013-10-03 NOTE — Assessment & Plan Note (Signed)
Lab Results  Component Value Date   HGBA1C 7.7 10/02/2013   HGBA1C 7.7 07/10/2013   HGBA1C 7.0 02/06/2013     Assessment: Diabetes control: fair control Progress toward A1C goal:  unchanged Comments: Patient did not fill his prescription of glipizide, which I sent him during his last visit  Plan: Medications:  Continue with metformin 1000 mg twice a day. Add glipizide 5 mg daily with breakfast. Home glucose monitoring: Frequency: once a day Timing: before breakfast Instruction/counseling given: reminded to get eye exam, reminded to bring blood glucose meter & log to each visit, reminded to bring medications to each visit and discussed diet Educational resources provided:   Self management tools provided:   Other plans: Followup within one month or as needed

## 2013-10-03 NOTE — Assessment & Plan Note (Signed)
Given patient's significant past medical history of coronary artery disease, and his reported history of chest pain, which might be related to muscle cramps, and shortness of breath I ordered an EKG in the office, which does not reveal any significant change compared to her prior EKG. I do not suspect that he has described shortness of breath and chest pain is cardiac in origin. Additionally, patient denies exertional chest pain. Most likely his described symptoms are due to generalized muscle cramps. No further workup is warranted at this time.

## 2013-10-03 NOTE — Progress Notes (Signed)
Patient ID: Daniel Perkins, male   DOB: 1955/07/22, 58 y.o.   MRN: [id number]   Subjective:   HPI: [name] is a 58 y.o. African American gentleman, with past medical history of diabetes, hypertension, obesity, coronary artery disease, and statin intolerance, presents with 4 days history of muscle cramps. Patient describes his muscle cramps as severe and intermittent, exacerbated by movements like when he turns in bed. He also describes aches in his calf muscles and arms. Sometimes he experiences chest pains associated with muscle cramps with shortness breath. He denies new medications recently. Of note, he was admitted three years ago with muscle cramps likely from volume depletion (it was around summer time June/2012) and statin intolerance. He was found to have elevated creatine. He was taking pravastatin at that time, and this has been discontinued since. It was also mentioned that he is muscle cramps had a chronic component to it. He denies recent heat exposure. He also reports that his been hydrating himself adequately. During his visit 2 weeks ago in the ED, his potassium was found to be 3.5. He has not been taking any potassium supplementation. However, he reports that since the onset of muscle cramps, has been taking a lot of mustard, and bananas.   Kindly see the A&P for the status of the pt's chronic medical problems.   ROS: Constitutional: Denies fever, chills, diaphoresis, appetite change and fatigue.  Respiratory: Denies SOB, DOE, cough, chest tightness, and wheezing. Denies chest pain with exertion. CVS: No chest pain, palpitations and leg swelling.  GI: No abdominal pain, nausea, vomiting, bloody stools GU: No dysuria, frequency, hematuria, or flank pain.  MSK: No back pain, joint swelling, arthralgias  Psych: No depression symptoms. No SI or SA.   Past Medical History  Diagnosis Date  . Coronary artery disease     Holter monitor 02/2012 - sinus with rare PVC  .  Gout   . Hyperlipidemia   . Hypertension   . Osteomyelitis   . CTEV (congenital talipes equinovarus)   . Diabetes mellitus without complication    Current Outpatient Prescriptions  Medication Sig Dispense Refill  . albuterol (PROVENTIL HFA;VENTOLIN HFA) 108 (90 BASE) MCG/ACT inhaler Inhale 1-2 puffs into the lungs every 6 (six) hours as needed for wheezing or shortness of breath.  1 Inhaler  0  . allopurinol (ZYLOPRIM) 100 MG tablet Take 1 tablet (100 mg total) by mouth daily.  30 tablet  2  . Blood Glucose Monitoring Suppl (ONE TOUCH ULTRA 2) W/DEVICE KIT Check blood sugar twice daily as instructed dx code 250.00  1 each  0  . dextromethorphan-guaiFENesin (MUCINEX DM) 30-600 MG per 12 hr tablet Take 1 tablet by mouth 2 (two) times daily.  14 tablet  1  . glucose blood (ONE TOUCH ULTRA TEST) test strip Check blood sugar twice daily as instructed dx code 250.00  100 each  5  . Lancet Devices MISC 1 kit by Does not apply route 3 (three) times daily.  100 each  11  . lisinopril-hydrochlorothiazide (PRINZIDE,ZESTORETIC) 20-25 MG per tablet Take 1 tablet by mouth daily.  30 tablet  11  . metFORMIN (GLUCOPHAGE) 1000 MG tablet Take 1 tablet (1,000 mg total) by mouth 2 (two) times daily with a meal.  60 tablet  3  . methocarbamol (ROBAXIN) 500 MG tablet Take 2 tablets (1,000 mg total) by mouth every 8 (eight) hours as needed for muscle spasms.  60 tablet  0  . metoprolol tartrate (LOPRESSOR) 25 MG tablet Take  1 tablet (25 mg total) by mouth 2 (two) times daily.  60 tablet  11  . ONETOUCH DELICA LANCETS FINE MISC Check blood sugar twice daily as instructed dx code 250.00  100 each  12  . glipiZIDE (GLUCOTROL) 5 MG tablet Take 1 tablet (5 mg total) by mouth daily before breakfast.  30 tablet  3   No current facility-administered medications for this visit.   Family History  Problem Relation Age of Onset  . Diabetes Mother   . Coronary artery disease Mother     HAS PACEMAKER  . Heart disease Mother    . Heart attack Brother    History   Social History  . Marital Status: Married    Spouse Name: N/A    Number of Children: N/A  . Years of Education: 13   Occupational History  .    . unemployed    Social History Main Topics  . Smoking status: Former Smoker    Quit date: 07/06/2007  . Smokeless tobacco: Never Used  . Alcohol Use: No  . Drug Use: No  . Sexual Activity: None   Other Topics Concern  . None   Social History Narrative  . None    Objective:  Physical Exam: Filed Vitals:   10/02/13 1510  BP: 159/96  Pulse: 106  Temp: 98.2 F (36.8 C)  TempSrc: Oral  Height: _0  (1.702 m)  Weight: 226 lb 8 oz (102.74 kg)  SpO2: 98%   General: Well nourished. No acute distress.  HEENT: Normal oral mucosa. MMM.  Lungs: CTA bilaterally. Heart: RRR; no extra sounds or murmurs  Abdomen: Non-distended, normal BS, soft, nontender; no hepatosplenomegaly  Extremities: No pedal edema. No joint swelling or tenderness. A mild muscle tenderness elicited on gentle squeeze of the extremities. Neurologic: Normal EOM,  Alert and oriented x3. No obvious neurologic/cranial nerve deficits.  Assessment & Plan:  I have discussed my assessment and plan  with  my attending in the clinic, Dr. Lynnae January  as detailed under problem based charting.

## 2013-10-03 NOTE — Assessment & Plan Note (Signed)
BP Readings from Last 3 Encounters:  10/02/13 159/96  08/22/13 157/79  07/10/13 130/80    Lab Results  Component Value Date   NA 136 10/02/2013   K 4.7 10/02/2013   CREATININE 1.14 10/02/2013    Assessment: Blood pressure control: controlled Progress toward BP goal:  at goal Comments:   Plan: Medications:  continue current medications Educational resources provided:   Self management tools provided:   Other plans: none

## 2013-10-03 NOTE — Telephone Encounter (Signed)
A user error has taken place: encounter opened in error, closed for administrative reasons.

## 2013-10-03 NOTE — Assessment & Plan Note (Signed)
Assessment: Most likely etiology is presumed volume depletion, presenting with a muscle cramps. Symptoms tend to worsen during summertime. He was hospitalized in June 2012 for 2 days with severe muscle cramps, and elevated creatinine. He has been off statins since that hospitalization. Careful review of his medications does not reveal any culprits. Patient denies heat exposure, but it is possible that his fluid intake might be inadequate given increasingly outside temperatures. CMP does not abnormalities like low potassium or magnesium. However, CK is somehow high at 479.  Ddx: I do not suspect autoimmune or infectious etiology. Given his predisposition to muscle cramps, a possibility of metabolic myopathies cannot be certainly excluded. TSH has been normal previously.   Plan: 1. Labs/imaging: CMP - largely normal. Mg - normal level. CK at 479. CRP mildly elevated at 1.6. ESR - pending 2. Therapy: Recommended adequate fluid rehydration. Will add Robaxin 1,68m Q8 hours when necessary 3. Follow up: as needed. If symptoms persist, will consider referral to rheumatology for further evaluation. I do not favor ordering further autoimmunes labs as they trends to have hight false positive rate.

## 2013-10-09 ENCOUNTER — Ambulatory Visit: Payer: Medicare Other | Admitting: Internal Medicine

## 2013-10-09 ENCOUNTER — Ambulatory Visit (INDEPENDENT_AMBULATORY_CARE_PROVIDER_SITE_OTHER): Payer: Medicare Other | Admitting: Internal Medicine

## 2013-10-09 ENCOUNTER — Encounter: Payer: Self-pay | Admitting: Internal Medicine

## 2013-10-09 VITALS — BP 97/67 | HR 101 | Temp 97.4°F | Resp 20 | Ht 67.0 in | Wt 225.2 lb

## 2013-10-09 DIAGNOSIS — E1149 Type 2 diabetes mellitus with other diabetic neurological complication: Secondary | ICD-10-CM

## 2013-10-09 DIAGNOSIS — Z789 Other specified health status: Secondary | ICD-10-CM

## 2013-10-09 DIAGNOSIS — N179 Acute kidney failure, unspecified: Secondary | ICD-10-CM

## 2013-10-09 DIAGNOSIS — I10 Essential (primary) hypertension: Secondary | ICD-10-CM

## 2013-10-09 DIAGNOSIS — R252 Cramp and spasm: Secondary | ICD-10-CM

## 2013-10-09 LAB — MAGNESIUM: MAGNESIUM: 2.2 mg/dL (ref 1.5–2.5)

## 2013-10-09 LAB — PHOSPHORUS: Phosphorus: 3.9 mg/dL (ref 2.3–4.6)

## 2013-10-09 LAB — CBC
HEMATOCRIT: 42.2 % (ref 39.0–52.0)
HEMOGLOBIN: 13.7 g/dL (ref 13.0–17.0)
MCH: 22.3 pg — ABNORMAL LOW (ref 26.0–34.0)
MCHC: 32.5 g/dL (ref 30.0–36.0)
MCV: 68.8 fL — AB (ref 78.0–100.0)
Platelets: 340 10*3/uL (ref 150–400)
RBC: 6.13 MIL/uL — ABNORMAL HIGH (ref 4.22–5.81)
RDW: 14.3 % (ref 11.5–15.5)
WBC: 7.5 10*3/uL (ref 4.0–10.5)

## 2013-10-09 LAB — COMPLETE METABOLIC PANEL WITH GFR
ALK PHOS: 108 U/L (ref 39–117)
ALT: 32 U/L (ref 0–53)
AST: 46 U/L — AB (ref 0–37)
Albumin: 3.7 g/dL (ref 3.5–5.2)
BUN: 24 mg/dL — ABNORMAL HIGH (ref 6–23)
CHLORIDE: 99 meq/L (ref 96–112)
CO2: 22 mEq/L (ref 19–32)
CREATININE: 1.31 mg/dL (ref 0.50–1.35)
Calcium: 9.9 mg/dL (ref 8.4–10.5)
GFR, Est African American: 69 mL/min
GFR, Est Non African American: 60 mL/min
Glucose, Bld: 125 mg/dL — ABNORMAL HIGH (ref 70–99)
Potassium: 5.1 mEq/L (ref 3.5–5.3)
Sodium: 135 mEq/L (ref 135–145)
Total Bilirubin: 0.3 mg/dL (ref 0.3–1.2)
Total Protein: 8.3 g/dL (ref 6.0–8.3)

## 2013-10-09 LAB — GLUCOSE, CAPILLARY: Glucose-Capillary: 120 mg/dL — ABNORMAL HIGH (ref 70–99)

## 2013-10-09 LAB — CK: Total CK: 1501 U/L — ABNORMAL HIGH (ref 7–232)

## 2013-10-09 NOTE — Progress Notes (Signed)
Case discussed with Dr. Kazibwe soon after the resident saw the patient.  We reviewed the resident's history and exam and pertinent patient test results.  I agree with the assessment, diagnosis, and plan of care documented in the resident's note. 

## 2013-10-09 NOTE — Progress Notes (Signed)
Patient ID: Daniel Perkins, male   DOB: 04-06-1956, 58 y.o.   MRN: [id number]   Subjective:   HPI: [name] is a 58 y.o. African American gentleman, with past medical history of diabetes, hypertension, obesity, coronary artery disease, and statin intolerance, presents with 2 weeks of history of worsening muscle cramps.   Last office visit 10/02/2013 for muscle cramps his labs revealed a normal CBC and CMP however CK level was elevated to 449 and slightly elevated CRP of 1.6. At that time I recommended adequate rehydration and Robaxin pills prn but the patient did not fiil this prescription. He reports to me that since his last visit, he has continued to feel lightheaded, and he describes visual changes with objects seeming to be further than their actual disturbace. No history of passing out.  He does not report spending a lot of time outside in the heat. No new medications since his last office visit. He denies fevers or other constitutional symptoms. He has been trying to rehydrate orally with large amounts of water, but also endorses some sodas.  During the summer of 2012, he required hospitalization for severe muscle cramps with acute renal failure, which were felt to be related to volume depletion in combination with statin use. He has been off statins since that time. He reports that he gets recurrent muscle cramps during summers, but nothing has been severe since 2012.  He was found to be orthostatic during his clinic visit today, but patient was not ready for a direct admission from [facility] due to his father, who is unwell from prostate cancer, and him being the main care provider.    Kindly see the A&P for the status of the pt's chronic medical problems.  ROS: Constitutional: Denies fever, chills, diaphoresis, appetite change and fatigue.  Respiratory: Denies SOB, DOE, cough, chest tightness, and wheezing. Denies chest pain with exertion. CVS: No chest pain, palpitations and  leg swelling.  GI: No abdominal pain, nausea, vomiting, bloody stools GU: No dysuria, frequency, hematuria, or flank pain.  MSK: No back pain, joint swelling, arthralgias  Psych: No depression symptoms. No SI or SA.   Past Medical History  Diagnosis Date  . Coronary artery disease     Holter monitor 02/2012 - sinus with rare PVC  . Gout   . Hyperlipidemia   . Hypertension   . Osteomyelitis   . CTEV (congenital talipes equinovarus)   . Diabetes mellitus without complication    Current Outpatient Prescriptions  Medication Sig Dispense Refill  . albuterol (PROVENTIL HFA;VENTOLIN HFA) 108 (90 BASE) MCG/ACT inhaler Inhale 1-2 puffs into the lungs every 6 (six) hours as needed for wheezing or shortness of breath.  1 Inhaler  0  . allopurinol (ZYLOPRIM) 100 MG tablet Take 1 tablet (100 mg total) by mouth daily.  30 tablet  2  . Blood Glucose Monitoring Suppl (ONE TOUCH ULTRA 2) W/DEVICE KIT Check blood sugar twice daily as instructed dx code 250.00  1 each  0  . dextromethorphan-guaiFENesin (MUCINEX DM) 30-600 MG per 12 hr tablet Take 1 tablet by mouth 2 (two) times daily.  14 tablet  1  . glipiZIDE (GLUCOTROL) 5 MG tablet Take 1 tablet (5 mg total) by mouth daily before breakfast.  30 tablet  3  . glucose blood (ONE TOUCH ULTRA TEST) test strip Check blood sugar twice daily as instructed dx code 250.00  100 each  5  . Lancet Devices MISC 1 kit by Does not apply route 3 (three) times  daily.  100 each  11  . lisinopril-hydrochlorothiazide (PRINZIDE,ZESTORETIC) 20-25 MG per tablet Take 1 tablet by mouth daily.  30 tablet  11  . metFORMIN (GLUCOPHAGE) 1000 MG tablet Take 1 tablet (1,000 mg total) by mouth 2 (two) times daily with a meal.  60 tablet  3  . methocarbamol (ROBAXIN) 500 MG tablet Take 2 tablets (1,000 mg total) by mouth every 8 (eight) hours as needed for muscle spasms.  60 tablet  0  . metoprolol tartrate (LOPRESSOR) 25 MG tablet Take 1 tablet (25 mg total) by mouth 2 (two) times  daily.  60 tablet  11  . ONETOUCH DELICA LANCETS FINE MISC Check blood sugar twice daily as instructed dx code 250.00  100 each  12   No current facility-administered medications for this visit.   Family History  Problem Relation Age of Onset  . Diabetes Mother   . Coronary artery disease Mother     HAS PACEMAKER  . Heart disease Mother   . Heart attack Brother    History   Social History  . Marital Status: Married    Spouse Name: N/A    Number of Children: N/A  . Years of Education: 13   Occupational History  .    . unemployed    Social History Main Topics  . Smoking status: Former Smoker    Quit date: 07/06/2007  . Smokeless tobacco: Never Used  . Alcohol Use: No  . Drug Use: No  . Sexual Activity: None   Other Topics Concern  . None   Social History Narrative  . None    Objective:  Physical Exam: Filed Vitals:   10/09/13 1338 10/09/13 1345  BP: 116/80 97/67  Pulse: 101   Temp: 97.4 F (36.3 C)   TempSrc: Oral   Resp: 20   Height: 5' 7" (1.702 m)   Weight: 225 lb 3.2 oz (102.15 kg)   SpO2: 97%   . Blood pressure lying: 116/80 with a pulse of 101 beats per minute. Standing blood pressure dropped to 97/67 with a pulse of 100.  General: Well nourished. No acute distress.  HEENT: Normal oral mucosa. MMM.  Lungs: CTA bilaterally. Heart: RRR; no extra sounds or murmurs  Abdomen: Non-distended, normal BS, soft, nontender; no hepatosplenomegaly  Extremities: No pedal edema. No joint swelling or tenderness. A mild muscle tenderness elicited on gentle squeeze of the extremities. Neurologic: Normal EOM,  Alert and oriented x3. No obvious neurologic/cranial nerve deficits.  Assessment & Plan:   Assessment:  Most likely etiology is presumed volume depletion, presenting with a muscle cramps. Symptoms tend to worsen during summertime. He was hospitalized in June 2012 for 2 days with severe muscle cramps, and AKI. He has been off statins since that hospitalization.  Again, today's, careful review of his medications does not reveal any culprits. Patient denies heat exposure, but it is possible that his fluid intake might be inadequate given increasingly outside temperatures. He appears appears to have a tendency to develop rhabdomyolysis with environmental heat exposure. Last visit's labs a week ago were marked for elevated CK of 476. His prior CK have been slightly high. Ddx: I do not suspect autoimmune or infectious etiology but these can not excluded certainly with his predisposition to muscle cramps. A possibility of metabolic myopathies cannot be certainly excluded. TSH has been normal previously will not recheck. Today his is orthostatic and symptoms have seen to worsen over the past week.   Plan:  I discussed the patient  with my attending, Dr. Ellwood Dense and we both felt that the patient require admission for IV fluids. However, declined citing social reasons. I will order stat labs, including a CMP, magnesium, phosphorus, CK and the CBC did not reveal critical results. Patient will be directly admitted to a med-surg bed tomorrow morning per patient preference. I informed him about the ramifications of his decision of going home with a low blood pressure and dehydration. He might collapse and pass out. I warmed him to avoid driving. He verbalized understanding. I advised him to drink at least 2 L of water and avoid spending time in the heat. I will hold stop HCTZ/lisinopril and Metoprolol. He will return to the hospital tomorrow for admission. If symptoms worse, he advised him to call 911 or present to ED.   I will route the note to admission team tomorrow to expect him tomorrow am.    Signed:  Jessee Avers, MD PGY-2 Internal Medicine Teaching Service Pager: [phone] 10/09/2013, 2:44 PM

## 2013-10-09 NOTE — Patient Instructions (Addendum)
General Instructions: Please take plenty of fluids mainly water  Be sure to not drive as you might pass out behind the wheels Please keep out of heat  Please come back for admission to the hospital as soon as you Please stop taking Lisinopril and hydrochlorothiazide and Metoprolol until you are instructed to do so  Treatment Goals:  Goals (1 Years of Data) as of 10/09/13         As of Today As of Today 10/02/13 08/22/13 08/22/13     Blood Pressure    . Blood Pressure < 140/90  97/67 116/80 159/96 157/79 150/99    . Blood Pressure < 140/90  97/67 116/80 159/96 157/79 150/99     Result Component    . HEMOGLOBIN A1C < 7.0    7.7      . LDL CALC < 100          . LDL CALC < 130           Weight    . Weight < 200 lb (90.719 kg)  225 lb 3.2 oz (102.15 kg)  226 lb 8 oz (102.74 kg) 247 lb (112.038 kg)      Orthostatic Hypotension Orthostatic hypotension is a sudden fall in blood pressure. It occurs when a person goes from a sitting or lying position to a standing position. CAUSES   Loss of body fluids (dehydration).  Medicines that lower blood pressure.  Sudden changes in posture, such as sudden standing when you have been sitting or lying down.  Taking too much of your medicine. SYMPTOMS   Lightheadedness or dizziness.  Fainting or near-fainting.  A fast heart rate (tachycardia).  Weakness.  Feeling tired (fatigue). DIAGNOSIS  Your caregiver may find the cause of orthostatic hypotension through:  A history and/or physical exam.  Checking your blood pressure. Your caregiver will check your blood pressure when you are:  Lying down.  Sitting.  Standing.  Tilt table testing. In this test, you are placed on a table that goes from a lying position to a standing position. You will be strapped to the table. This test helps to monitor your blood pressure and heart rate when you are in different positions. TREATMENT   If orthostatic hypotension is caused by your medicines,  your caregiver will need to adjust your dosage. Do not stop or adjust your medicine on your own.  When changing positions, make these changes slowly. This allows your body to adjust to the different position.  Compression stockings that are worn on your lower legs may be helpful.  Your caregiver may have you consume extra salt. Do not add extra salt to your diet unless directed by your caregiver.  Eat frequent, small meals. Avoid sudden standing after eating.  Avoid hot showers or excessive heat.  Your caregiver may give you fluids through the vein (intravenous).  Your caregiver may put you on medicine to help enhance fluid retention. SEEK IMMEDIATE MEDICAL CARE IF:   You faint or have a near-fainting episode. Call your local emergency services (911 in [place]  You have or develop chest pain.  You feel sick to your stomach (nauseous) or vomit.  You have a loss of feeling or movement in your arms or legs.  You have difficulty talking, slurred speech, or you are unable to talk.  You have difficulty thinking or have confused thinking. MAKE SURE YOU:   Understand these instructions.  Will watch your condition.  Will get help right away if you are  not doing well or get worse. Document Released: 04/15/2002 Document Revised: 07/18/2011 Document Reviewed: 08/08/2008 [facility] Patient Information 2014 [place], [place].

## 2013-10-10 ENCOUNTER — Inpatient Hospital Stay (HOSPITAL_COMMUNITY)
Admission: RE | Admit: 2013-10-10 | Discharge: 2013-10-11 | DRG: 558 | Disposition: A | Discharge status: Home / Self Care | Payer: Medicare Other | Source: Ambulatory Visit | Attending: Internal Medicine | Admitting: Internal Medicine

## 2013-10-10 ENCOUNTER — Encounter (HOSPITAL_COMMUNITY): Payer: Self-pay | Admitting: General Practice

## 2013-10-10 ENCOUNTER — Inpatient Hospital Stay (HOSPITAL_COMMUNITY): Payer: Medicare Other

## 2013-10-10 DIAGNOSIS — R0602 Shortness of breath: Secondary | ICD-10-CM | POA: Diagnosis present

## 2013-10-10 DIAGNOSIS — E1165 Type 2 diabetes mellitus with hyperglycemia: Secondary | ICD-10-CM | POA: Diagnosis present

## 2013-10-10 DIAGNOSIS — R748 Abnormal levels of other serum enzymes: Secondary | ICD-10-CM | POA: Diagnosis present

## 2013-10-10 DIAGNOSIS — R252 Cramp and spasm: Secondary | ICD-10-CM | POA: Diagnosis present

## 2013-10-10 DIAGNOSIS — Z789 Other specified health status: Secondary | ICD-10-CM | POA: Diagnosis present

## 2013-10-10 DIAGNOSIS — M109 Gout, unspecified: Secondary | ICD-10-CM | POA: Diagnosis present

## 2013-10-10 DIAGNOSIS — E0865 Diabetes mellitus due to underlying condition with hyperglycemia: Secondary | ICD-10-CM | POA: Diagnosis present

## 2013-10-10 DIAGNOSIS — E785 Hyperlipidemia, unspecified: Secondary | ICD-10-CM | POA: Diagnosis present

## 2013-10-10 DIAGNOSIS — Z87891 Personal history of nicotine dependence: Secondary | ICD-10-CM

## 2013-10-10 DIAGNOSIS — I2510 Atherosclerotic heart disease of native coronary artery without angina pectoris: Secondary | ICD-10-CM | POA: Diagnosis present

## 2013-10-10 DIAGNOSIS — M6282 Rhabdomyolysis: Principal | ICD-10-CM | POA: Diagnosis present

## 2013-10-10 DIAGNOSIS — I951 Orthostatic hypotension: Secondary | ICD-10-CM | POA: Diagnosis present

## 2013-10-10 DIAGNOSIS — I10 Essential (primary) hypertension: Secondary | ICD-10-CM | POA: Diagnosis present

## 2013-10-10 DIAGNOSIS — E861 Hypovolemia: Secondary | ICD-10-CM | POA: Diagnosis present

## 2013-10-10 DIAGNOSIS — E119 Type 2 diabetes mellitus without complications: Secondary | ICD-10-CM | POA: Diagnosis present

## 2013-10-10 DIAGNOSIS — R209 Unspecified disturbances of skin sensation: Secondary | ICD-10-CM | POA: Diagnosis not present

## 2013-10-10 HISTORY — DX: Congenital talipes equinovarus, right foot: Q66.01

## 2013-10-10 HISTORY — DX: Type 2 diabetes mellitus without complications: E11.9

## 2013-10-10 HISTORY — DX: Pneumonia, unspecified organism: J18.9

## 2013-10-10 HISTORY — DX: Congenital talipes equinovarus, left foot: Q66.02

## 2013-10-10 HISTORY — DX: Acute myocardial infarction, unspecified: I21.9

## 2013-10-10 LAB — COMPREHENSIVE METABOLIC PANEL
ALBUMIN: 3.5 g/dL (ref 3.5–5.2)
ALT: 30 U/L (ref 0–53)
AST: 44 U/L — ABNORMAL HIGH (ref 0–37)
Alkaline Phosphatase: 101 U/L (ref 39–117)
BUN: 26 mg/dL — ABNORMAL HIGH (ref 6–23)
CALCIUM: 9.6 mg/dL (ref 8.4–10.5)
CO2: 20 mEq/L (ref 19–32)
Chloride: 101 mEq/L (ref 96–112)
Creatinine, Ser: 1.17 mg/dL (ref 0.50–1.35)
GFR calc Af Amer: 78 mL/min — ABNORMAL LOW (ref >90)
GFR calc non Af Amer: 68 mL/min — ABNORMAL LOW (ref >90)
GLUCOSE: 129 mg/dL — AB (ref 70–99)
POTASSIUM: 4.4 meq/L (ref 3.7–5.3)
SODIUM: 135 meq/L — AB (ref 137–147)
TOTAL PROTEIN: 7.9 g/dL (ref 6.0–8.3)
Total Bilirubin: <0.2 mg/dL — ABNORMAL LOW (ref 0.3–1.2)

## 2013-10-10 LAB — RAPID URINE DRUG SCREEN, HOSP PERFORMED
Amphetamines: NOT DETECTED
BARBITURATES: NOT DETECTED
Benzodiazepines: NOT DETECTED
Cocaine: NOT DETECTED
Opiates: NOT DETECTED
Tetrahydrocannabinol: NOT DETECTED

## 2013-10-10 LAB — CBC
HCT: 39.4 % (ref 39.0–52.0)
HEMOGLOBIN: 13.4 g/dL (ref 13.0–17.0)
MCH: 23.1 pg — AB (ref 26.0–34.0)
MCHC: 34 g/dL (ref 30.0–36.0)
MCV: 67.9 fL — ABNORMAL LOW (ref 78.0–100.0)
PLATELETS: 340 10*3/uL (ref 150–400)
RBC: 5.8 MIL/uL (ref 4.22–5.81)
RDW: 14.2 % (ref 11.5–15.5)
WBC: 8 10*3/uL (ref 4.0–10.5)

## 2013-10-10 LAB — URINALYSIS, ROUTINE W REFLEX MICROSCOPIC
Bilirubin Urine: NEGATIVE
Glucose, UA: NEGATIVE mg/dL
Hgb urine dipstick: NEGATIVE
KETONES UR: NEGATIVE mg/dL
LEUKOCYTES UA: NEGATIVE
Nitrite: NEGATIVE
PH: 5.5 (ref 5.0–8.0)
Protein, ur: NEGATIVE mg/dL
Specific Gravity, Urine: 1.019 (ref 1.005–1.030)
Urobilinogen, UA: 2 mg/dL — ABNORMAL HIGH (ref 0.0–1.0)

## 2013-10-10 LAB — MAGNESIUM: Magnesium: 2 mg/dL (ref 1.5–2.5)

## 2013-10-10 LAB — PHOSPHORUS: Phosphorus: 3.6 mg/dL (ref 2.3–4.6)

## 2013-10-10 LAB — TROPONIN I: Troponin I: <0.3 ng/mL (ref <0.30)

## 2013-10-10 LAB — CK: CK TOTAL: 1077 U/L — AB (ref 7–232)

## 2013-10-10 MED ORDER — ENOXAPARIN SODIUM 40 MG/0.4ML ~~LOC~~ SOLN
40.0000 mg | SUBCUTANEOUS | Status: DC
  Administered 2013-10-10: 40 mg via SUBCUTANEOUS
  Filled 2013-10-10: qty 0.4

## 2013-10-10 MED ORDER — ALBUTEROL SULFATE (2.5 MG/3ML) 0.083% IN NEBU: 2.5000 mg | INHALATION_SOLUTION | Freq: Four times a day (QID) | RESPIRATORY_TRACT | Status: DC | PRN

## 2013-10-10 MED ORDER — SODIUM CHLORIDE 0.9 % IV SOLN
INTRAVENOUS | Status: DC
  Administered 2013-10-10 – 2013-10-11 (×2): via INTRAVENOUS

## 2013-10-10 MED ORDER — GLIPIZIDE 5 MG PO TABS
5.0000 mg | ORAL_TABLET | Freq: Every day | ORAL | Status: DC
  Administered 2013-10-11: 5 mg via ORAL
  Filled 2013-10-10: qty 1

## 2013-10-10 MED ORDER — ALBUTEROL SULFATE HFA 108 (90 BASE) MCG/ACT IN AERS: 1.0000 | INHALATION_SPRAY | Freq: Four times a day (QID) | RESPIRATORY_TRACT | Status: DC | PRN

## 2013-10-10 MED ORDER — ALLOPURINOL 100 MG PO TABS
100.0000 mg | ORAL_TABLET | Freq: Every day | ORAL | Status: DC
  Administered 2013-10-10 – 2013-10-11 (×2): 100 mg via ORAL
  Filled 2013-10-10 (×2): qty 1

## 2013-10-10 MED ORDER — SODIUM CHLORIDE 0.9 % IV BOLUS (SEPSIS)
1000.0000 mL | Freq: Once | INTRAVENOUS | Status: AC
  Administered 2013-10-10: 1000 mL via INTRAVENOUS

## 2013-10-10 NOTE — H&P (Signed)
Pt seen and examined. In brief patient is a 58 y/o male with PMH of DM, HTN, CAD, HLD, gout who presents with muscle cramps and elevated CK. Pt states that he was feeling well till last week when he started to develop muscle cramps- in b/l LE, thighs and upper abdomen. He was evaluated in the clinic on 5/27 and 6/3. He was noted to have elevated CK (approx 1500 yesterday) and came in for admission today. Of note, he was also found to be orthostatic in the clinic and endorsed lightheadedness especially on going outside. Patient had a similar episode with rhabdomyolysis, AKI in summer of 2012 and was taken off his statin for possible statin induced myopathy. No cp, no sob, no fevers, no syncope. Remaining ROS negative  Physical exam; Lungs- CTA b/l Cardio- RRR, normal heart sounds Gen- AAO*3, NAD Abd- soft, non tender, non distended, BS + Ext- no pedal edema  Assessment and plan:  Rhabdomyolysis - c/w IVF- 1 litre NS bolus followed by 200 cc/hr for 12 hours - Monitor CK daily - f/u UDS, urine myoglobin - f/u LE dopplers - If worsening CK or recurrent symptoms pt may need further work up to rule out myositis  Orthostatic hypotension - recheck orthostatics today - c/w IVF - Hold HCTZ - f/u EKG. troponins negative  DM - Monitor CBGs for now. Will consider sensitive insulin sliding scale while inpatient if elevated  HTN - would hold lisinopril, HCTZ given orthostasis - consider restarting lisinopril if BP elevated  Case d/w patient, residents in detail

## 2013-10-10 NOTE — H&P (Signed)
Date: 10/10/2013               Patient Name:  Daniel Perkins MRN: [id number]  DOB: Mar 29, 1956 Age / Sex: 58 y.o., male   PCP: Dow Adolph, MD         Medical Service: Internal Medicine Teaching Service         Attending Physician: Dr. Earl Lagos, MD    First Contact: Dr. Marina Goodell Pager: [phone]  Second Contact: Dr. Burr Medico Pager: [phone]       After Hours (After 5p/  First Contact Pager: [phone]  weekends / holidays): Second Contact Pager: [phone]   Chief Complaint: muscle cramps  History of Present Illness: Daniel Perkins is a 58 year old male with history significant for diabetes mellitus type 2, hypertension, coronary artery disease, hyperlipidemia with statin intolerance and gout. He presents for direct admission today after clinic evaluation for muscle cramps. He was evaluated in the clinic 10/02/2013 and 10/09/2013 and found to have elevated CK levels of 449 and 1500 respectively and orthostatic hypotension. He had endorse lightheadedness, changes in visual depth perception while driving, bilateral severe calf, inner thigh, and subcostal cramping over the past 2 weeks. He reports a severe episode of left lower calf cramping 10/08/2013 which was very severe enough to prompt him to pull over from driving to stretch his legs out (reports that this episode brought tears of pain to his eyes). In addition, he reports mild shortness of breath especially during muscle cramps episodes. Denies overt chest pain other than that described as cramping under his ribs on both sides. He denies recent exertion, exercise, trauma, or heat exposure. He does report that he mowed his lawn with the riding lawn mower a couple of weeks ago when the weather was above 90. Denies dark urine, flank pain, tobacco use, alcohol use other than less than a half a can of beer on his birthday 2 days ago. Of note, he reports that the muscle cramps feels similar to that experienced in the summer of 2012  whereby he required hospitalization for severe muscle cramps and acute renal failure in the setting of statin.  Meds: Current Facility-Administered Medications  Medication Dose Route Frequency Provider Last Rate Last Dose  . sodium chloride 0.9 % bolus 1,000 mL  1,000 mL Intravenous Once Manuela Schwartz, MD       Followed by  . 0.9 %  sodium chloride infusion   Intravenous Continuous Manuela Schwartz, MD      . albuterol (PROVENTIL HFA;VENTOLIN HFA) 108 (90 BASE) MCG/ACT inhaler 1-2 puff  1-2 puff Inhalation Q6H PRN Manuela Schwartz, MD      . allopurinol (ZYLOPRIM) tablet 100 mg  100 mg Oral Daily Manuela Schwartz, MD      . enoxaparin (LOVENOX) injection 40 mg  40 mg Subcutaneous Q24H Manuela Schwartz, MD      . Melene Muller ON 10/11/2013] glipiZIDE (GLUCOTROL) tablet 5 mg  5 mg Oral QAC breakfast Manuela Schwartz, MD        Allergies: Allergies as of 10/09/2013 - Review Complete 10/09/2013  Allergen Reaction Noted  . Morphine Itching 08/10/2009  . Pravastatin sodium Other (See Comments) 06/28/2012   Past Medical History  Diagnosis Date  . Coronary artery disease     Holter monitor 02/2012 - sinus with rare PVC  . Gout   . Hyperlipidemia   . Hypertension   . Osteomyelitis   . CTEV (congenital talipes equinovarus)   . Diabetes mellitus without  complication    Past Surgical History  Procedure Laterality Date  . Neck surgery    . Foot surgery  x16    Both feet   Family History  Problem Relation Age of Onset  . Diabetes Mother   . Coronary artery disease Mother     HAS PACEMAKER  . Heart disease Mother   . Heart attack Brother    History   Social History  . Marital Status: Married    Spouse Name: N/A    Number of Children: N/A  . Years of Education: 13   Occupational History  .    . unemployed    Social History Main Topics  . Smoking status: Former Smoker    Quit date: 07/06/2007  . Smokeless tobacco: Never Used  . Alcohol  Use: No  . Drug Use: No  . Sexual Activity: Not on file   Other Topics Concern  . Not on file   Social History Narrative  . No narrative on file    Review of Systems: Constitutional:  Denies fever, chills, diaphoresis, appetite change and fatigue.  HEENT: Endorses visual changes noticed while driving (objects appear further away), Denies photophobia, eye pain, redness, congestion, sore throat, rhinorrhea, sneezing  Respiratory: Endorses SOB, DOE, Denies cough, chest tightness, and wheezing.  Cardiovascular: Denies chest pain, palpitations and leg swelling.  Gastrointestinal: Denies nausea, vomiting, abdominal pain, diarrhea, constipation, blood in stool and abdominal distention.  Genitourinary: Denies dysuria, urgency, frequency, hematuria, flank pain and difficulty urinating.  Musculoskeletal: Endorses myalgias and pain below ribs bilaterally, Last gout attack several months ago or more, Denies back pain, joint swelling  Skin: Denies rash and wound.  Neurological: Endorses light-headedness, Denies dizziness, seizures, syncope, weakness, numbness and headaches.   Hematological: Denies bruising, personal or family bleeding history.  Psychiatric/ Behavioral: Denies confusion, nervousness, sleep disturbance and agitation.     Physical Exam: Blood pressure 139/75, pulse 112, temperature 97.6 F (36.4 C), temperature source Oral, resp. rate 22, height 5\' 7"  (1.702 m), weight 221 lb 1.9 oz (100.3 kg), SpO2 95.00%. BP 139/75  Pulse 112  Temp(Src) 97.6 F (36.4 C) (Oral)  Resp 22  Ht 5\' 7"  (1.702 m)  Wt 221 lb 1.9 oz (100.3 kg)  BMI 34.62 kg/m2  SpO2 95% General: Pleasant, Well-developed, well-nourished, AA male, in no acute distress; Head: Normocephalic, atraumatic. Eyes: PERRLA, EOMI, No signs of anemia or jaundice. Nose: Moist mucous membranes, no purulence Throat: Oropharynx nonerythematous, no exudate appreciated.  Neck: supple, no masses, no carotid Bruits, no JVD  appreciated. Lungs: Normal respiratory effort. Clear to auscultation bilaterally from apices to bases without crackles or wheezes appreciated. Heart: slightly tachycardic rate, regular rhythm, normal S1 and S2, no gallop, murmur, or rubs appreciated. Abdomen: BS normoactive. Soft, Nondistended, non-tender. No masses or organomegaly appreciated. Extremities: No pretibial edema, distal pulses intact, bilateral mild calf tenderness, no thigh inner thigh tenderness, symmetric calf size Neurologic: grossly non-focal, alert and oriented x3, appropriate and cooperative throughout examination.   Lab results: Basic Metabolic Panel:  Recent Labs  16/10/96 1428  NA 135  K 5.1  CL 99  CO2 22  GLUCOSE 125*  BUN 24*  CREATININE 1.31  CALCIUM 9.9  MG 2.2  PHOS 3.9   Liver Function Tests:  Recent Labs  10/09/13 1428  AST 46*  ALT 32  ALKPHOS 108  BILITOT 0.3  PROT 8.3  ALBUMIN 3.7   CBC:  Recent Labs  10/09/13 1428  WBC 7.5  HGB 13.7  HCT  42.2  MCV 68.8*  PLT 340   Cardiac Enzymes:  Recent Labs  10/09/13 1428  CKTOTAL 1501*   CBG:  Recent Labs  10/09/13 1349  GLUCAP 120*   Assessment & Plan by Problem: Mr. Gale JourneyWestmoreland is a 58 year old admitted with muscle cramps, orthostatic hypotension, and elevated CK levels concerning for rhabdomyolysis.  #1 mild rhabdomyolysis: Likely secondary to hypovolemia in setting of inefficient oral intake, CK levels tripled over one day to 1500. Metabolic and immune etiologies lower on differential but should be further explored if fluid rehydration ineffective and/or recurrent episodes. Magnesium and phosphorus levels within normal limits -Aggressive IV hydration with 1 L normal saline followed by 200 cc per hour  -Lower extremity Doppler given severe left calf pain  -Trending CK levels -Check urinalysis for myoglobin -Check UDS   #2 Shortness of breath: May be related to severe subcostal muscle cramping but given history of CAD  and risk factors of hypertension hyperlipidemia and diabetes mellitus will need further evaluation. Last 2D ECHO 2012 with EF 65% and question of diastolic dysfunction. -Check troponin -Check EKG -Check 2-D echocardiogram  #3 orthostatic hypotension: Likely secondary to volume depletion, patient has not taken antihypertensive regimen over the past day as [facility]  -Check orthostatic blood pressure  -IV hydration as noted above   #4 DM Type 2: Fair control on metformin 1000 mg twice a day with recent hemoglobin A1c 7.7  -Hold metformin in acute setting -monitor cbgs  #5 CAD: stable, work-up noted above for shortness of breath  #6 DVT ppx: Lovenox SQ Dispo: Disposition is deferred at this time, awaiting improvement of current medical problems. Anticipated discharge in approximately 1-2 day(s).   The patient does have a current PCP Dow Adolph(Richard Kazibwe, MD) and [facility].  The patient does not have transportation limitations that hinder transportation to clinic appointments.  Signed: Manuela SchwartzKaren-Akosua M Luci Bellucci, MD 10/10/2013, 10:55 AM

## 2013-10-11 DIAGNOSIS — R252 Cramp and spasm: Secondary | ICD-10-CM

## 2013-10-11 DIAGNOSIS — M6282 Rhabdomyolysis: Principal | ICD-10-CM

## 2013-10-11 DIAGNOSIS — E119 Type 2 diabetes mellitus without complications: Secondary | ICD-10-CM

## 2013-10-11 DIAGNOSIS — R209 Unspecified disturbances of skin sensation: Secondary | ICD-10-CM

## 2013-10-11 DIAGNOSIS — I951 Orthostatic hypotension: Secondary | ICD-10-CM

## 2013-10-11 LAB — BASIC METABOLIC PANEL
BUN: 16 mg/dL (ref 6–23)
CO2: 23 mEq/L (ref 19–32)
Calcium: 8.8 mg/dL (ref 8.4–10.5)
Chloride: 102 mEq/L (ref 96–112)
Creatinine, Ser: 1.08 mg/dL (ref 0.50–1.35)
GFR calc Af Amer: 86 mL/min — ABNORMAL LOW (ref >90)
GFR calc non Af Amer: 74 mL/min — ABNORMAL LOW (ref >90)
Glucose, Bld: 146 mg/dL — ABNORMAL HIGH (ref 70–99)
Potassium: 5.4 mEq/L — ABNORMAL HIGH (ref 3.7–5.3)
SODIUM: 135 meq/L — AB (ref 137–147)

## 2013-10-11 LAB — CK: Total CK: 519 U/L — ABNORMAL HIGH (ref 7–232)

## 2013-10-11 LAB — GLUCOSE, CAPILLARY: Glucose-Capillary: 125 mg/dL — ABNORMAL HIGH (ref 70–99)

## 2013-10-11 MED ORDER — METOPROLOL TARTRATE 25 MG PO TABS: 25.0000 mg | ORAL_TABLET | Freq: Two times a day (BID) | ORAL | Status: DC

## 2013-10-11 NOTE — Progress Notes (Signed)
Subjective: Patient still having mild lightheadedness at rest that is not necessarily worse with standing or movement. Denies room spinning dizziness. Pt does report having L sided facial tingling that began this AM. Pt denies any focal weakness or other numbness/tingling. No vision changes, or changes to speech, HA, SOB, CP. Muscle cramps improved overnight.  Objective: Vital signs in last 24 hours: Filed Vitals:   10/10/13 1341 10/10/13 1343 10/10/13 2218 10/11/13 0640  BP: 131/82 109/67 118/75 146/90  Pulse: 99 110 89 93  Temp: 98 F (36.7 C) 98 F (36.7 C) 98.2 F (36.8 C) 97.9 F (36.6 C)  TempSrc: Oral Oral Oral Oral  Resp: 20 18 17 16   Height:      Weight:      SpO2: 98% 96% 95% 95%   Weight change:   Intake/Output Summary (Last 24 hours) at 10/11/13 0954 Last data filed at 10/11/13 0700  Gross per 24 hour  Intake 4603.34 ml  Output    579 ml  Net 4024.34 ml   Physical Exam General: alert, cooperative, and in no apparent distress HEENT: pupils equal round and reactive to light, vision grossly intact, oropharynx clear and non-erythematous  Neck: supple Lungs: clear to ascultation bilaterally, normal work of respiration, no wheezes, rales, ronchi Heart: regular rate and rhythm, no murmurs, gallops, or rubs Abdomen: soft, non-tender, non-distended, normal bowel sounds Extremities: warm extremities, no pedal edema, no tenderness to palpation Neurologic: alert & oriented X3, cranial nerves II-XII intact, strength 5/5 throughout, sensation intact to light touch, no cerebellar signs   Lab Results: Basic Metabolic Panel:  Recent Labs Lab 10/09/13 1428 10/10/13 1209 10/11/13 0513  NA 135 135* 135*  K 5.1 4.4 5.4*  CL 99 101 102  CO2 22 20 23   GLUCOSE 125* 129* 146*  BUN 24* 26* 16  CREATININE 1.31 1.17 1.08  CALCIUM 9.9 9.6 8.8  MG 2.2 2.0  --   PHOS 3.9 3.6  --    Liver Function Tests:  Recent Labs Lab 10/09/13 1428 10/10/13 1209  AST 46* 44*  ALT  32 30  ALKPHOS 108 101  BILITOT 0.3 <0.2*  PROT 8.3 7.9  ALBUMIN 3.7 3.5   CBC:  Recent Labs Lab 10/09/13 1428 10/10/13 1209  WBC 7.5 8.0  HGB 13.7 13.4  HCT 42.2 39.4  MCV 68.8* 67.9*  PLT 340 340   Cardiac Enzymes:  Recent Labs Lab 10/09/13 1428 10/10/13 1209 10/11/13 0513  CKTOTAL 1501* 1077* 519*  TROPONINI  --  <0.30  --    CBG:  Recent Labs Lab 10/09/13 1349 10/11/13 0759  GLUCAP 120* 125*   Urine Drug Screen: Drugs of Abuse     Component Value Date/Time   LABOPIA NONE DETECTED 10/10/2013 1709   COCAINSCRNUR NONE DETECTED 10/10/2013 1709   LABBENZ NONE DETECTED 10/10/2013 1709   AMPHETMU NONE DETECTED 10/10/2013 1709   THCU NONE DETECTED 10/10/2013 1709   LABBARB NONE DETECTED 10/10/2013 1709   Urinalysis:  Recent Labs Lab 10/10/13 1709  COLORURINE YELLOW  LABSPEC 1.019  PHURINE 5.5  GLUCOSEU NEGATIVE  HGBUR NEGATIVE  BILIRUBINUR NEGATIVE  KETONESUR NEGATIVE  PROTEINUR NEGATIVE  UROBILINOGEN 2.0*  NITRITE NEGATIVE  LEUKOCYTESUR NEGATIVE    Micro Results: No results found for this or any previous visit (from the past 240 hour(s)). Studies/Results: X-ray Chest Pa And Lateral   10/10/2013   CLINICAL DATA:  Shortness of Breath  EXAM: CHEST  2 VIEW  COMPARISON:  August 22, 2013  FINDINGS: There is  patchy predominantly interstitial infiltrate in the right mid and lower lung zones, slightly increased compared to recent prior study. Lungs elsewhere are clear. Heart size and pulmonary vascularity are normal. No adenopathy. There is postoperative change in the cervical spine.  IMPRESSION: Patchy interstitial infiltrate on the right in the mid and lower lung zones. There may be some underlying chronic bronchitis type change in this area. There is felt to be superimposed interstitial infiltrate on the right given the change from the prior study. Elsewhere lungs are clear. No change in cardiac silhouette.   Electronically Signed   By: Bretta BangWilliam  Woodruff M.D.   On:  10/10/2013 12:47   Medications: I have reviewed the patient's current medications. Scheduled Meds: . allopurinol  100 mg Oral Daily  . glipiZIDE  5 mg Oral QAC breakfast   Continuous Infusions: . sodium chloride 200 mL/hr at 10/11/13 0918   PRN Meds:.albuterol  Assessment/Plan: # mild rhabdomyolysis 2/2 muscle cramps:  Suspect 2/2 dehydration. Muscle cramping has improved after receiving 2L NS and NS at 200cc/hr overnight (total approx 4L). CK level trending down (1500-->519). UA wnl. UDS negative. Kidney function is normal (Cr 1.17-->1.08). No need for LE dopplers so this was canceled.  -continue hydration -likely discharge today pending MRI results below  # Left facial tingling: Patient has had similar episode before. Unclear etiology. Given his persistent lightheadedness, we wantd to check MRI to r/o CVA--however, patient refused. No other work up needed at this time. Neuro exam completely normal so CVA is fairly unlikely. Not likely to be cardiac related though pt reports that when this happened in the past he thinks it was his "heart." Troponin x 1 negative and EKG wnl. 2D echo ordered yesterday was canceled given no clinical need for this test at this time.  #orthostatic hypotension: Patient was orthostatic on exam yesterday. S/p approx 4L IVF overnight. WIll recheck orthostatics today. Pt with persistent lightheadedness, though somewhat improved.  -Repeat orthostatic blood pressure  -IV hydration as noted above   #DM Type 2: Fair control on metformin 1000 mg twice a day with recent hemoglobin A1c 7.7  -Hold metformin in acute setting  -monitor cbgs --CBGs stable overnight in the 120s  #DVT ppx: Lovenox SQ  Dispo: Discharge likely today.  The patient does have a current PCP Dow Adolph(Richard Kazibwe, MD) and [facility].  The patient does not have transportation limitations that hinder transportation to clinic  appointments.  .Services Needed at time of discharge: Y = Yes, Blank = No PT:   OT:   RN:   Equipment:   Other:     LOS: 1 day   Windell Hummingbirdachel Sai Zinn, MD 10/11/2013, 9:54 AM

## 2013-10-11 NOTE — Progress Notes (Signed)
Pt seen and examined. He feels well. He complains of mild lightheadedness and tingling over left face. No CP, no fevers, no muscle cramps, no focal weakness, no changes to speech or vision.  Physical exam:   Lungs- CTA b/l  Cardio- RRR, normal heart sounds  Gen- AAO*3, NAD  Abd- soft, non tender, non distended, BS +  Ext- no pedal edema   Assessment and plan:   Rhabdomyolysis  - d/c IVF. Symptoms improving  - CK improving. Would check repeat next week in clinic  - UDS negative  Orthostatic hypotension  - d/c IVF. Now improved  - Hold HCTZ    DM  - resume home meds on d/c  HTN  - Lisinopril resumed. K mildly elevated today - recheck BMP in clinic at follow up - hold lisinopril  Lightheadedness/facaial numbness - pt refused MRI. I personally educated him about warning signs of stroke and he said he would follow up immediately if symptoms get worse  Pt stable for d/c home today with close outpatient follow up

## 2013-10-11 NOTE — Discharge Instructions (Signed)
Creatine Kinase, CK This is a test to determine if there has been damage to muscle in your body. This would also include heart muscle. It may be done if you have signs and symptoms of a heart attack (chest pain), if you have muscle pain or muscular weakness. Creatine kinase is an enzyme found in the heart, brain, skeletal muscle, and other tissues. Enzymes are proteins that help cells to perform their normal functions. In muscle and heart cells, most of this energy is used when muscles contract.  There are three different forms of CK in your body; they are referred to as isoenzymes:  CK-MM (found in your skeletal muscles and heart)  CK-MB (found mostly in your heart)  CK-BB (found mostly in your brain) PREPARATION FOR TEST A blood sample is obtained by inserting a needle into a vein in the arm. NORMAL FINDINGS Total CK  Adult/elderly (Values are higher after exercise.)  Male: 55-170 units/L or 55-170 units/L (SI units)  Male: 30-135 units/L or 30-135 units/L (SI units)  Newborn: 68-580 units/L (SI units) Isoenzymes  CK-MM: 100%  CK-MB: 0%  CK-BB: 0% Ranges for normal findings may vary among different laboratories and hospitals. You should always check with your doctor after having lab work or other tests done to discuss the meaning of your test results and whether your values are considered within normal limits. MEANING OF TEST  Your caregiver will go over the test results with you and discuss the importance and meaning of your results, as well as treatment options and the need for additional tests if necessary. OBTAINING THE TEST RESULTS It is your responsibility to obtain your test results. Ask the lab or department performing the test when and how you will get your results. Document Released: 05/26/2004 Document Revised: 07/18/2011 Document Reviewed: 04/03/2008 [facility] Patient Information 2014 [place], [place]. Muscle Cramps and Spasms Muscle cramps and spasms occur when a  muscle or muscles tighten and you have no control over this tightening (involuntary muscle contraction). They are a common problem and can develop in any muscle. The most common place is in the calf muscles of the leg. Both muscle cramps and muscle spasms are involuntary muscle contractions, but they also have differences:   Muscle cramps are sporadic and painful. They may last a few seconds to a quarter of an hour. Muscle cramps are often more forceful and last longer than muscle spasms.  Muscle spasms may or may not be painful. They may also last just a few seconds or much longer. CAUSES  It is uncommon for cramps or spasms to be due to a serious underlying problem. In many cases, the cause of cramps or spasms is unknown. Some common causes are:   Overexertion.   Overuse from repetitive motions (doing the same thing over and over).   Remaining in a certain position for a long period of time.   Improper preparation, form, or technique while performing a sport or activity.   Dehydration.   Injury.   Side effects of some medicines.   Abnormally low levels of the salts and ions in your blood (electrolytes), especially potassium and calcium. This could happen if you are taking water pills (diuretics) or you are pregnant.  Some underlying medical problems can make it more likely to develop cramps or spasms. These include, but are not limited to:   Diabetes.   Parkinson disease.   Hormone disorders, such as thyroid problems.   Alcohol abuse.   Diseases specific to muscles, joints, and  bones.   Blood vessel disease where not enough blood is getting to the muscles.  HOME CARE INSTRUCTIONS   Stay well hydrated. Drink enough water and fluids to keep your urine clear or pale yellow.  It may be helpful to massage, stretch, and relax the affected muscle.  For tight or tense muscles, use a warm towel, heating pad, or hot shower water directed to the affected area.  If you  are sore or have pain after a cramp or spasm, applying ice to the affected area may relieve discomfort.  Put ice in a plastic bag.  Place a towel between your skin and the bag.  Leave the ice on for 15-20 minutes, 03-04 times a day.  Medicines used to treat a known cause of cramps or spasms may help reduce their frequency or severity. Only take over-the-counter or prescription medicines as directed by your caregiver. SEEK MEDICAL CARE IF:  Your cramps or spasms get more severe, more frequent, or do not improve over time.  MAKE SURE YOU:   Understand these instructions.  Will watch your condition.  Will get help right away if you are not doing well or get worse. Document Released: 10/15/2001 Document Revised: 08/20/2012 Document Reviewed: 04/11/2012 [facility] Patient Information 2014 [place].

## 2013-10-11 NOTE — Discharge Summary (Signed)
INTERNAL MEDICINE ATTENDING DISCHARGE COSIGN   I evaluated the patient on the day of discharge and discussed the discharge plan with my resident team. I agree with the discharge documentation and disposition.   Daniel Perkins 10/11/2013, 2:30 PM

## 2013-10-11 NOTE — Progress Notes (Signed)
Discussed discharge summary with pt. Reviewed medications with pt. Pt did not have any questions.

## 2013-10-11 NOTE — Discharge Summary (Signed)
Name: Daniel Perkins MRN: [id number] DOB: 20-Dec-1955 58 y.o. PCP: Jessee Avers, MD  Date of Admission: 10/10/2013  9:11 AM Date of Discharge: 10/11/2013 Attending Physician: Dr. Dareen Piano   Discharge Diagnosis: Principal Problem:   Elevated CK Active Problems:   Diabetes Type 2   HYPERTENSION   CAD (coronary artery disease)   Gout   Statin intolerance   Muscle cramps   Shortness of breath   Rhabdomyolysis   Orthostatic hypotension  Discharge Medications:   Medication List         albuterol 108 (90 BASE) MCG/ACT inhaler  Commonly known as:  PROVENTIL HFA;VENTOLIN HFA  Inhale 1-2 puffs into the lungs every 6 (six) hours as needed for wheezing or shortness of breath.     allopurinol 100 MG tablet  Commonly known as:  ZYLOPRIM  Take 1 tablet (100 mg total) by mouth daily.     dextromethorphan-guaiFENesin 30-600 MG per 12 hr tablet  Commonly known as:  MUCINEX DM  Take 1 tablet by mouth 2 (two) times daily.     glipiZIDE 5 MG tablet  Commonly known as:  GLUCOTROL  Take 1 tablet (5 mg total) by mouth daily before breakfast.     glucose blood test strip  Commonly known as:  ONE TOUCH ULTRA TEST  Check blood sugar twice daily as instructed dx code 250.00     Lancet Devices Misc  1 kit by Does not apply route 3 (three) times daily.     metFORMIN 1000 MG tablet  Commonly known as:  GLUCOPHAGE  Take 1 tablet (1,000 mg total) by mouth 2 (two) times daily with a meal.     methocarbamol 500 MG tablet  Commonly known as:  ROBAXIN  Take 2 tablets (1,000 mg total) by mouth every 8 (eight) hours as needed for muscle spasms.     metoprolol tartrate 25 MG tablet  Commonly known as:  LOPRESSOR  Take 1 tablet (25 mg total) by mouth 2 (two) times daily.     ONE TOUCH ULTRA 2 W/DEVICE Kit  Check blood sugar twice daily as instructed dx code 670.14     ONETOUCH DELICA LANCETS FINE Misc  Check blood sugar twice daily as instructed dx code 250.00       Disposition and  follow-up:   Daniel Perkins was discharged from [facility] in Stable condition.  At the hospital follow up visit please address:  1.  Elevated CK 2/2 muscle spasm- please reassess CK level and BMP (K slightly elevated on day of discharge) 2. Orthostatic hypotension w/ lightheadedness- please assess symptoms and check orthostatics (he was not orthostatic on morning of discharge after IVF resuscitation, though still w/ some lightheadedness); also reassess need to restart his HCTZ-lisinopril   2.  Labs / imaging needed at time of follow-up: BMP  3.  Pending labs/ test needing follow-up: none  Follow-up Appointments: Follow-up Information   Follow up with Jessee Avers, MD On 10/16/2013. (1:45pm)    Specialty:  Internal Medicine   Contact information:   [address] [place] [address] [phone]       Discharge Instructions: Discharge Instructions   Call MD for:  persistant dizziness or light-headedness    Complete by:  As directed      Call MD for:  persistant nausea and vomiting    Complete by:  As directed      Call MD for:  severe uncontrolled pain    Complete by:  As directed  Diet - low sodium heart healthy    Complete by:  As directed      Increase activity slowly    Complete by:  As directed           Consultations:  none  Procedures Performed:  X-ray Chest Pa And Lateral   10/10/2013   CLINICAL DATA:  Shortness of Breath  EXAM: CHEST  2 VIEW  COMPARISON:  August 22, 2013  FINDINGS: There is patchy predominantly interstitial infiltrate in the right mid and lower lung zones, slightly increased compared to recent prior study. Lungs elsewhere are clear. Heart size and pulmonary vascularity are normal. No adenopathy. There is postoperative change in the cervical spine.  IMPRESSION: Patchy interstitial infiltrate on the right in the mid and lower lung zones. There may be some underlying chronic bronchitis type change in this area. There is felt  to be superimposed interstitial infiltrate on the right given the change from the prior study. Elsewhere lungs are clear. No change in cardiac silhouette.   Electronically Signed   By: Lowella Grip M.D.   On: 10/10/2013 12:47   Admission HPI:  Daniel Perkins is a 58 year old male with history significant for diabetes mellitus type 2, hypertension, coronary artery disease, hyperlipidemia with statin intolerance and gout. He presents for direct admission today after clinic evaluation for muscle cramps. He was evaluated in the clinic 10/02/2013 and 10/09/2013 and found to have elevated CK levels of 449 and 1500 respectively and orthostatic hypotension. He had endorse lightheadedness, changes in visual depth perception while driving, bilateral severe calf, inner thigh, and subcostal cramping over the past 2 weeks. He reports a severe episode of left lower calf cramping 10/08/2013 which was very severe enough to prompt him to pull over from driving to stretch his legs out (reports that this episode brought tears of pain to his eyes). In addition, he reports mild shortness of breath especially during muscle cramps episodes. Denies overt chest pain other than that described as cramping under his ribs on both sides. He denies recent exertion, exercise, trauma, or heat exposure. He does report that he mowed his lawn with the riding lawn mower a couple of weeks ago when the weather was above 90. Denies dark urine, flank pain, tobacco use, alcohol use other than less than a half a can of beer on his birthday 2 days ago. Of note, he reports that the muscle cramps feels similar to that experienced in the summer of 2012 whereby he required hospitalization for severe muscle cramps and acute renal failure in the setting of statin.  Hospital Course by problem list:  # Mild rhabdomyolysis 2/2 muscle cramps: Patient with elevated CK on admission (1500) with h/o muscle cramping with increasing frequency since warmer weather.  Pt w/ hx of similar episodes 2 summers ago. Suspect 2/2 dehydration especially considering patient was orthostatic on admission that resolved after receiving IVF. Muscle cramping improved (decreased frequency) after receiving 2L NS and NS at 200cc/hr overnight (total approx 4L). CK level trended down nicely (1500-->519). UA wnl. UDS negative. Mg and Phos levels wnl. Kidney function remained normal (Cr 1.17-->1.08). He was discharged home with close follow up at [facility] next week.  # Left facial tingling: Patient c/o L facial tingling which began on morning of discharge. No other neurological symptoms. Has had similar episode before last year. Unclear etiology. Given his persistent lightheadedness, we wanted to check MRI to r/o CVA--however, patient refused. No other work up pursued at this time. Patient was counseled  regarding symptoms of stroke to look out for and to return to ED if he experiences any of these symptoms at home. Neuro exam completely normal so CVA is fairly unlikely. Not likely to be cardiac related though pt reports that when this happened in the past he thinks it was his "heart." Troponin x 1 negative and EKG wnl. 2D echo ordered yesterday was canceled given no clinical need for this test at this time.   # Orthostatic hypotension: Patient was orthostatic on admission (sit 131/82--> stand 109/67). S/p approx 4L IVF overnight and was no longer orthostatic on day of discharge. Pt with persistent lightheadedness, though somewhat improved. Unsure why patient remains slightly lightheaded. However, I think it may be related to dehydration. Encouraged good PO intake of fluids at home and to avoid staying outside in the heat for long periods of time. His home antihypertensives were held overnight (HCTZ-lisinopril 25-37m daily and metoprolol 246mBID). Restarted his metoprolol given stable BP on day of discharge (of note, on med rec metoprolol and his HCTZ-lisinopril was taken off for some reason so it  shows up as new, but he had been taking this medication at home). Continued to hold the HCTZ-lisinopril given this may have contributed to his hypovolemia. Please assess the need to restart this medication.  #DM Type 2: Fair control on metformin 1000 mg twice a day and glipizide 66m66maily, with recent hemoglobin A1c 7.7. His home metformin was held but glipizide was continued. CBGs stable overnight in the 120s. He was sent home on his home regimen.   Discharge Vitals:   BP 146/90  Pulse 93  Temp(Src) 97.9 F (36.6 C) (Oral)  Resp 16  Ht _0  (1.702 m)  Wt 221 lb 1.9 oz (100.3 kg)  BMI 34.62 kg/m2  SpO2 95%  Discharge Labs:  Results for orders placed during the hospital encounter of 10/10/13 (from the past 24 hour(s))  URINALYSIS, ROUTINE W REFLEX MICROSCOPIC     Status: Abnormal   Collection Time    10/10/13  5:09 PM      Result Value Ref Range   Color, Urine YELLOW  YELLOW   APPearance CLEAR  CLEAR   Specific Gravity, Urine 1.019  1.005 - 1.030   pH 5.5  5.0 - 8.0   Glucose, UA NEGATIVE  NEGATIVE mg/dL   Hgb urine dipstick NEGATIVE  NEGATIVE   Bilirubin Urine NEGATIVE  NEGATIVE   Ketones, ur NEGATIVE  NEGATIVE mg/dL   Protein, ur NEGATIVE  NEGATIVE mg/dL   Urobilinogen, UA 2.0 (*) 0.0 - 1.0 mg/dL   Nitrite NEGATIVE  NEGATIVE   Leukocytes, UA NEGATIVE  NEGATIVE  URINE RAPID DRUG SCREEN (HOSP PERFORMED)     Status: None   Collection Time    10/10/13  5:09 PM      Result Value Ref Range   Opiates NONE DETECTED  NONE DETECTED   Cocaine NONE DETECTED  NONE DETECTED   Benzodiazepines NONE DETECTED  NONE DETECTED   Amphetamines NONE DETECTED  NONE DETECTED   Tetrahydrocannabinol NONE DETECTED  NONE DETECTED   Barbiturates NONE DETECTED  NONE DETECTED  BASIC METABOLIC PANEL     Status: Abnormal   Collection Time    10/11/13  5:13 AM      Result Value Ref Range   Sodium 135 (*) 137 - 147 mEq/L   Potassium 5.4 (*) 3.7 - 5.3 mEq/L   Chloride 102  96 - 112 mEq/L   CO2 23  19 -  32  mEq/L   Glucose, Bld 146 (*) 70 - 99 mg/dL   BUN 16  6 - 23 mg/dL   Creatinine, Ser 1.08  0.50 - 1.35 mg/dL   Calcium 8.8  8.4 - 10.5 mg/dL   GFR calc non Af Amer 74 (*) >90 mL/min   GFR calc Af Amer 86 (*) >90 mL/min  CK     Status: Abnormal   Collection Time    10/11/13  5:13 AM      Result Value Ref Range   Total CK 519 (*) 7 - 232 U/L  GLUCOSE, CAPILLARY     Status: Abnormal   Collection Time    10/11/13  7:59 AM      Result Value Ref Range   Glucose-Capillary 125 (*) 70 - 99 mg/dL    Signed: Rebecca Eaton, MD 10/11/2013, 2:12 PM   Time Spent on Discharge: 35 minutes Services Ordered on Discharge: none Equipment Ordered on Discharge: none

## 2013-10-16 ENCOUNTER — Encounter: Payer: Self-pay | Admitting: Internal Medicine

## 2013-10-16 ENCOUNTER — Encounter: Payer: Medicare Other | Admitting: Internal Medicine

## 2013-10-21 NOTE — Progress Notes (Signed)
Case discussed with Dr.Kazibwe at the time of the visit.  We reviewed the resident's history and exam and pertinent patient test results.  I agree with the assessment, diagnosis, and plan of care documented in the resident's note.    

## 2013-10-23 ENCOUNTER — Encounter: Payer: Self-pay | Admitting: Internal Medicine

## 2013-10-23 ENCOUNTER — Ambulatory Visit (INDEPENDENT_AMBULATORY_CARE_PROVIDER_SITE_OTHER): Payer: Medicare Other | Admitting: Internal Medicine

## 2013-10-23 VITALS — BP 150/80 | HR 86 | Temp 97.2°F | Ht 67.0 in | Wt 232.5 lb

## 2013-10-23 DIAGNOSIS — E119 Type 2 diabetes mellitus without complications: Secondary | ICD-10-CM

## 2013-10-23 DIAGNOSIS — R252 Cramp and spasm: Secondary | ICD-10-CM

## 2013-10-23 DIAGNOSIS — I10 Essential (primary) hypertension: Secondary | ICD-10-CM

## 2013-10-23 DIAGNOSIS — M6282 Rhabdomyolysis: Secondary | ICD-10-CM

## 2013-10-23 DIAGNOSIS — E1149 Type 2 diabetes mellitus with other diabetic neurological complication: Secondary | ICD-10-CM

## 2013-10-23 DIAGNOSIS — F528 Other sexual dysfunction not due to a substance or known physiological condition: Secondary | ICD-10-CM

## 2013-10-23 MED ORDER — SILDENAFIL CITRATE 50 MG PO TABS: 50.0000 mg | ORAL_TABLET | ORAL | Status: DC | PRN

## 2013-10-23 MED ORDER — LISINOPRIL 10 MG PO TABS: 10.0000 mg | ORAL_TABLET | Freq: Every day | ORAL | Status: DC

## 2013-10-23 NOTE — Progress Notes (Signed)
Attending Physician Note: Presenting problems, physical findings and medications, reviewed with resident physician Dr Richard KaziDow Adolphbwe and I concur with his management plan. We will explore possibility that his elevated CK enzyme is phosphorylase enzyme deficiency (McCardle's Syndrome). Cephas DarbyJames Granfortuna, MD, FACP  Hematology-Oncology/Internal Medicine

## 2013-10-23 NOTE — Patient Instructions (Signed)
General Instructions: Please start taking Lisinopril 10 mg daily  I will check some blood and urine in the lab and I will call you with results.  Please follow up her in 2 weeks  Treatment Goals:  Goals (1 Years of Data) as of 10/23/13         As of Today As of Today As of Today As of Today 10/11/13     Blood Pressure    . Blood Pressure < 140/90  150/80 150/100 140/80 169/93 146/90    . Blood Pressure < 140/90  150/80 150/100 140/80 169/93 146/90     Result Component    . HEMOGLOBIN A1C < 7.0          . LDL CALC < 100          . LDL CALC < 130           Weight    . Weight < 200 lb (90.719 kg)  232 lb 8 oz (105.461 kg) 232 lb 8 oz (105.461 kg)         Progress Toward Treatment Goals:  Treatment Goal 10/23/2013  Hemoglobin A1C unchanged  Blood pressure unchanged    Self Care Goals & Plans:  Self Care Goal 10/02/2013  Manage my medications take my medicines as prescribed; bring my medications to every visit; refill my medications on time; follow the sick day instructions if I am sick  Monitor my health -  Eat healthy foods -  Be physically active -    Home Blood Glucose Monitoring 10/23/2013  Check my blood sugar once a day  When to check my blood sugar -     Care Management & Community Referrals:  Referral 07/10/2013  Referrals made for care management support diabetes educator; nutritionist

## 2013-10-23 NOTE — Assessment & Plan Note (Signed)
Will cont with Metformin and Glucotrol.

## 2013-10-23 NOTE — Assessment & Plan Note (Signed)
Assessment: Most likely etiology is presumed volume depletion, presenting with a muscle cramps. Symptoms tend to worsen during summertime. He was hospitalized in June 2012 for 2 days and again recently 10/10/2013 to 10/11/2013 with severe muscle cramps, and elevated creatinine and positive orthostatics. He is off statins. Careful review of his medications does not reveal any culprits. Patient denies heat exposure, but it is possible that his fluid intake might be inadequate given increasingly outside temperatures. During his recent hospitalization, CK level was 1500 and this trended down with IV fluids to 500. Ddx: Given his predisposition to muscle cramps, a possibility of metabolic myopathies like McArdle disease can not be certainly excluded. TSH has been normal previously. Discussed with Dr Cyndie ChimeGranfortuna about this interesting case of recurrent muscles cramps and CK elevation.    Plan: 1. Labs/imaging: CMP, CK level and urinalysis for myoglobinuria 2. Therapy: none Follow up: as needed. Will consider working him up Myophosphorylase deficiency if his labs reveal persistent elevation in CK even when asymptomatic.

## 2013-10-23 NOTE — Progress Notes (Signed)
Patient ID: Daniel Perkins, male   DOB: 1955-12-06, 58 y.o.   MRN: [id number]  Subjective:   HPI: Daniel Perkins is a 58 y.o. African American gentleman, with past medical history of diabetes, hypertension, obesity, coronary artery disease, and statin intolerance presents for a hospital follow up visit.   He was hospitalized (6/4-6/5) for elevated CK/rhabdomyolysis, orthostatic hypotension in the setting of dehydration and muscles cramps. He was resuscitated with IVF and discharged in a stable condition. Renal function was normal and CK level improved from 1500 to 500.  His home HCTZ-Lisinopril were held but he was continued with metoprolol.   He reports that the muscle cramps have resolved. He denies any lightheadedness, or visual alterations. He reports that he has been taking plenty of fluids.  He asks me whether he can donate plasma. His papers from the donation center indicate that they can take off up to 880cc of plasma with one session of donation. I have recommended plasma donation at this time given his tendencies to become volume depleted and becoming orthostatic with muscles cramps. I advised him that the best this time for plasma donation in his case would probably be during the winter when the heat is less given his medical history.   He requests for refill Viagra for his erectile dysfunction.     ROS: Constitutional: Denies fever, chills, diaphoresis, appetite change and fatigue.  Respiratory: Denies SOB, DOE, cough, chest tightness, and wheezing. Denies chest pain with exertion. CVS: No chest pain, palpitations and leg swelling.  GI: No abdominal pain, nausea, vomiting, bloody stools GU: No dysuria, frequency, hematuria, or flank pain.  MSK: No back pain, joint swelling, arthralgias  Psych: No depression symptoms. No SI or SA.   Past Medical History  Diagnosis Date  . Coronary artery disease     Holter monitor 02/2012 - sinus with rare PVC  . Gout   .  Hyperlipidemia   . Hypertension   . Osteomyelitis   . CTEV (congenital talipes equinovarus)   . Myocardial infarction ~ 2007    "mild"  . Type II diabetes mellitus   . Club foot of both lower extremities 1958  . Pneumonia 2000's    "couple times"   Current Outpatient Prescriptions  Medication Sig Dispense Refill  . albuterol (PROVENTIL HFA;VENTOLIN HFA) 108 (90 BASE) MCG/ACT inhaler Inhale 1-2 puffs into the lungs every 6 (six) hours as needed for wheezing or shortness of breath.  1 Inhaler  0  . allopurinol (ZYLOPRIM) 100 MG tablet Take 1 tablet (100 mg total) by mouth daily.  30 tablet  2  . Blood Glucose Monitoring Suppl (ONE TOUCH ULTRA 2) W/DEVICE KIT Check blood sugar twice daily as instructed dx code 250.00  1 each  0  . dextromethorphan-guaiFENesin (MUCINEX DM) 30-600 MG per 12 hr tablet Take 1 tablet by mouth 2 (two) times daily.  14 tablet  1  . glipiZIDE (GLUCOTROL) 5 MG tablet Take 1 tablet (5 mg total) by mouth daily before breakfast.  30 tablet  3  . glucose blood (ONE TOUCH ULTRA TEST) test strip Check blood sugar twice daily as instructed dx code 250.00  100 each  5  . Lancet Devices MISC 1 kit by Does not apply route 3 (three) times daily.  100 each  11  . metFORMIN (GLUCOPHAGE) 1000 MG tablet Take 1 tablet (1,000 mg total) by mouth 2 (two) times daily with a meal.  60 tablet  3  . methocarbamol (ROBAXIN) 500 MG tablet Take 2  tablets (1,000 mg total) by mouth every 8 (eight) hours as needed for muscle spasms.  60 tablet  0  . metoprolol tartrate (LOPRESSOR) 25 MG tablet Take 1 tablet (25 mg total) by mouth 2 (two) times daily.  60 tablet  0  . ONETOUCH DELICA LANCETS FINE MISC Check blood sugar twice daily as instructed dx code 250.00  100 each  12   No current facility-administered medications for this visit.   Family History  Problem Relation Age of Onset  . Diabetes Mother   . Coronary artery disease Mother     HAS PACEMAKER  . Heart disease Mother   . Heart  attack Brother    History   Social History  . Marital Status: Married    Spouse Name: N/A    Number of Children: N/A  . Years of Education: 13   Occupational History  .    . unemployed    Social History Main Topics  . Smoking status: Former Smoker -- 1.00 packs/day for 32 years    Types: Cigarettes    Quit date: 07/06/2007  . Smokeless tobacco: Never Used  . Alcohol Use: Yes     Comment: "usually drink one small can beer/year; birthday or superbowl"  . Drug Use: No  . Sexual Activity: No   Other Topics Concern  . Not on file   Social History Narrative  . No narrative on file    Objective:  Physical Exam: Filed Vitals:   10/23/13 1432 10/23/13 1446 10/23/13 1447 10/23/13 1450  BP: 169/93 140/80 150/100 150/80  Pulse: 80 76 80 86  Temp: 97.2 F (36.2 C)   97.2 F (36.2 C)  TempSrc: Oral   Oral  Height:    '5\' 7"'  (1.702 m)  Weight: 232 lb 8 oz (105.461 kg)   232 lb 8 oz (105.461 kg)  SpO2: 98%   98%  Blood pressure lying:  140/80  with a pulse of 76  beats per minute.  150/100 with a pulse of 80 on sitting and on standing blood pressure dropped to  150/80  with a pulse of  86.  General: Well nourished. No acute distress.  HEENT: Normal oral mucosa. MMM.  Lungs: CTA bilaterally. Heart: RRR; no extra sounds or murmurs  Abdomen: Non-distended, normal BS, soft, nontender; no hepatosplenomegaly  Extremities: No pedal edema. No joint swelling or tenderness. no muscle tenderness  Neurologic: Normal EOM,  Alert and oriented x3. No obvious neurologic/cranial nerve deficits.  Assessment & Plan:   I have discussed my assessment and plan  with Dr. Beryle Beams . Please see details under problem based charting.

## 2013-10-23 NOTE — Assessment & Plan Note (Signed)
BP Readings from Last 3 Encounters:  10/23/13 150/80  10/11/13 146/90  10/09/13 97/67    Lab Results  Component Value Date   NA 135* 10/11/2013   K 5.4* 10/11/2013   CREATININE 1.08 10/11/2013    Assessment: Blood pressure control: mildly elevated Progress toward BP goal:  unchanged Comments: Has been off HCTZ-lisinopril 10-12.5 mg daily  Plan: Medications:  1. metoprolol 25 mg twice a day 2.restart lisinopril 10 mg daily Educational resources provided:   Self management tools provided:   Other plans: Continue to hold HCTZ Followup in 2 weeks and adjust medications as needed BMP today for potassium level and renal function

## 2013-10-24 LAB — URINALYSIS, COMPLETE
BACTERIA UA: NONE SEEN
Bilirubin Urine: NEGATIVE
Casts: NONE SEEN
Crystals: NONE SEEN
Glucose, UA: NEGATIVE mg/dL
Hgb urine dipstick: NEGATIVE
Ketones, ur: NEGATIVE mg/dL
Leukocytes, UA: NEGATIVE
NITRITE: NEGATIVE
Protein, ur: NEGATIVE mg/dL
SPECIFIC GRAVITY, URINE: 1.019 (ref 1.005–1.030)
SQUAMOUS EPITHELIAL / LPF: NONE SEEN
UROBILINOGEN UA: 1 mg/dL (ref 0.0–1.0)
pH: 5 (ref 5.0–8.0)

## 2013-10-24 LAB — COMPLETE METABOLIC PANEL WITH GFR
ALK PHOS: 98 U/L (ref 39–117)
ALT: 20 U/L (ref 0–53)
AST: 21 U/L (ref 0–37)
Albumin: 3.9 g/dL (ref 3.5–5.2)
BILIRUBIN TOTAL: 0.4 mg/dL (ref 0.2–1.2)
BUN: 11 mg/dL (ref 6–23)
CO2: 24 mEq/L (ref 19–32)
Calcium: 9.6 mg/dL (ref 8.4–10.5)
Chloride: 103 mEq/L (ref 96–112)
Creat: 1.05 mg/dL (ref 0.50–1.35)
GFR, EST NON AFRICAN AMERICAN: 78 mL/min
GFR, Est African American: >89 mL/min
Glucose, Bld: 97 mg/dL (ref 70–99)
Potassium: 4.7 mEq/L (ref 3.5–5.3)
SODIUM: 138 meq/L (ref 135–145)
Total Protein: 7.2 g/dL (ref 6.0–8.3)

## 2013-10-24 LAB — CK: CK TOTAL: 190 U/L (ref 7–232)

## 2013-10-29 ENCOUNTER — Telehealth: Payer: Self-pay | Admitting: *Deleted

## 2013-10-29 NOTE — Telephone Encounter (Signed)
Opened in error

## 2013-10-30 ENCOUNTER — Encounter: Payer: Medicare Other | Admitting: Internal Medicine

## 2013-11-06 ENCOUNTER — Ambulatory Visit (INDEPENDENT_AMBULATORY_CARE_PROVIDER_SITE_OTHER): Payer: Medicare Other | Admitting: Internal Medicine

## 2013-11-06 ENCOUNTER — Encounter: Payer: Self-pay | Admitting: Internal Medicine

## 2013-11-06 ENCOUNTER — Encounter: Payer: Self-pay | Admitting: Licensed Clinical Social Worker

## 2013-11-06 ENCOUNTER — Ambulatory Visit: Payer: Medicare Other | Admitting: Internal Medicine

## 2013-11-06 VITALS — BP 148/97 | HR 83 | Temp 97.0°F | Ht 68.0 in | Wt 234.4 lb

## 2013-11-06 DIAGNOSIS — E1149 Type 2 diabetes mellitus with other diabetic neurological complication: Secondary | ICD-10-CM

## 2013-11-06 DIAGNOSIS — N529 Male erectile dysfunction, unspecified: Secondary | ICD-10-CM

## 2013-11-06 DIAGNOSIS — R0602 Shortness of breath: Secondary | ICD-10-CM

## 2013-11-06 DIAGNOSIS — M109 Gout, unspecified: Secondary | ICD-10-CM

## 2013-11-06 DIAGNOSIS — E119 Type 2 diabetes mellitus without complications: Secondary | ICD-10-CM

## 2013-11-06 DIAGNOSIS — R252 Cramp and spasm: Secondary | ICD-10-CM

## 2013-11-06 DIAGNOSIS — I10 Essential (primary) hypertension: Secondary | ICD-10-CM

## 2013-11-06 DIAGNOSIS — F528 Other sexual dysfunction not due to a substance or known physiological condition: Secondary | ICD-10-CM

## 2013-11-06 DIAGNOSIS — I951 Orthostatic hypotension: Secondary | ICD-10-CM

## 2013-11-06 LAB — GLUCOSE, CAPILLARY: Glucose-Capillary: 130 mg/dL — ABNORMAL HIGH (ref 70–99)

## 2013-11-06 MED ORDER — ALLOPURINOL 100 MG PO TABS: 100.0000 mg | ORAL_TABLET | Freq: Every day | ORAL | Status: DC

## 2013-11-06 MED ORDER — SILDENAFIL CITRATE 50 MG PO TABS: 50.0000 mg | ORAL_TABLET | Freq: Every day | ORAL | Status: DC | PRN

## 2013-11-06 NOTE — Progress Notes (Signed)
Subjective:    Patient ID: Daniel Perkins, male    DOB: 12/03/1955, 58 y.o.   MRN: [id number]  HPI Comments: 58 y.o pmh CAD (with history of statin intolerance), HTN, HLD, Osteomyelitis, CTEV (congenital talipes equinovarus)/club foot, Myocardial infarction (MI)~ 2007, Type II diabetes mellitus (7.7 09/30/13), pneumonia, obesity, GERD, ED, Fe def anemia, h/o orthostatic hypotension, 08/2010 echo with ?diastolic dysfunction EF 65%, mild LVH, mild AR.   Daniel Perkins presents for 2 week f/u: 1) Recently admitted 6/4-6/5 for elevated CK, muscle cramps, and orthostasis and seen 6/17 for HFU.  Daniel Perkins was found to be intolerant to statins and for orthostasis h/o HCTZ use which was held and Daniel Perkins was continued on Metoprolol 25 mg bid, Lisinopril 10 mg for HTN though has not picked up Lisinopril yet and plans to on Friday. Daniel Perkins last took his BP meds at 1 am this am.  2) HTN-continue medications. Orthostatics checked today.  They were + but patient asymptomatic.   3) DM 2-HA1C 7.7 5/25. CBG 130 today.  Will have pt f/u 8/25. On metformin 1000 mg bid and Glipizide 5 qd. Daniel Perkins has not been taking Glipizide  4) ED-wants Rx for Viagra as discussed with Dr. Kirtland BouchardK at last visit.  Daniel Perkins tried 50 mg off the street which did not help and Daniel Perkins did not experience side effects.  Daniel Perkins has trouble with getting erection.  Daniel Perkins is not on prn NTG.   5) Allopurinol needs rx refill for gout   SH Quit smoking 4-5 years ago and previously smoked for 10-12 years. pfts done neg COPD     Review of Systems  Constitutional: Negative for fever and fatigue.  Eyes: Negative for visual disturbance.       Denies blurry vision  Respiratory: Positive for shortness of breath.        Sob noted at rest and with exertion last noted last week and over the weekend it will last 1 min and go away   Cardiovascular: Negative for chest pain and leg swelling.  Gastrointestinal: Positive for diarrhea. Negative for nausea and vomiting.       Diarrhea noted with milk products    Musculoskeletal:       Denies muscle cramps  Neurological: Negative for dizziness and light-headedness.       Denies falls  Psychiatric/Behavioral: The patient is not nervous/anxious.        Objective:   Physical Exam  Nursing note and vitals reviewed. Constitutional: Daniel Perkins is oriented to person, place, and time. Daniel Perkins appears well-developed and well-nourished. Daniel Perkins is cooperative.  HENT:  Head: Normocephalic and atraumatic.  Mouth/Throat: Oropharynx is clear and moist and mucous membranes are normal. Abnormal dentition. No oropharyngeal exudate.  Eyes: Conjunctivae are normal. Pupils are equal, round, and reactive to light. Right eye exhibits no discharge. Left eye exhibits no discharge. No scleral icterus.  Cardiovascular: Normal rate, regular rhythm, S1 normal, S2 normal and normal heart sounds.   No murmur heard. No lower ext edema   Pulmonary/Chest: Effort normal and breath sounds normal.  Abdominal: Soft. Bowel sounds are normal. Daniel Perkins exhibits no distension. There is no tenderness.  Obese   Musculoskeletal: Daniel Perkins exhibits no edema.  Neurological: Daniel Perkins is alert and oriented to person, place, and time. Gait normal.  Skin: Skin is warm and dry. No rash noted.  Psychiatric: Daniel Perkins has a normal mood and affect. His speech is normal and behavior is normal. Judgment and thought content normal. Cognition and memory are normal.  Assessment & Plan:  F/u in 2-4 weeks HTN then F/u end of August chronic medical conditions

## 2013-11-06 NOTE — Assessment & Plan Note (Signed)
Rx refill Viagra to try 50 mg x1 #2 tablets no refills  Disc SE of concurrent use with nitrates Can gradually titrate up to 100 mg if needed

## 2013-11-06 NOTE — Assessment & Plan Note (Addendum)
BP Readings from Last 3 Encounters:  11/06/13 148/97  10/23/13 150/80  10/11/13 146/90    Lab Results  Component Value Date   NA 138 10/23/2013   K 4.7 10/23/2013   CREATININE 1.05 10/23/2013    Assessment: Blood pressure control: moderately elevated Progress toward BP goal:  unchanged Comments: encouraged patient to pick up Lisinopril 10 mg to start taking  Plan: Medications:  continue current medications (Lopressor 25 mg bid and start Lisinopril 10 mg qd)  Educational resources provided: other (see comments) Self management tools provided: other (see comments);home blood pressure logbook Other plans: recently with symptomatic orthostatic hypotension thought 2/2 dehydration early June.  Orthostatics checked today and + lying 172/100, sitting 159/106 (HR 83), standing 148/97 (HR 83).  Will f/u in 2-4 weeks for BP check after starting lisinopril and to check orthostatics again with addition of Lisinopril

## 2013-11-06 NOTE — Assessment & Plan Note (Signed)
Needs Rx refill Allopurinol given today

## 2013-11-06 NOTE — Assessment & Plan Note (Signed)
Noted with exertion and rest Etiology: pt denies anxiety, pfts neg for COPD, 08/2010 echo with ? Diastolic dysfunction but hard to tell.  Does not appear to have acute diastolic HF today.  No leg edema. 10/10/13 CXR with chronic bronchitis, 6/4 CBC w/o anemia  Plan:  Informed pt to reduce fluid intake as he drinks 5 L of fluid per day.  Reduce to 2-3 L per day to see if helps.  Pt is not using Albuterol prn but likely should prn to see if he gets relief

## 2013-11-06 NOTE — Patient Instructions (Addendum)
General Instructions: After starting Lisinopril please follow up in 2-4 weeks for blood pressure check with Dr. Shirlee Latch  Read info below Please follow up in late August for diabetes and other medical conditions  Decrease water intake to no more than 2-3 liters per day Take care and thanks    Treatment Goals:  Goals (1 Years of Data) as of 11/06/13         As of Today As of Today As of Today 10/23/13 10/23/13     Blood Pressure    . Blood Pressure < 140/90  148/97 159/106 172/100 150/80 150/100    . Blood Pressure < 140/90  148/97 159/106 172/100 150/80 150/100     Result Component    . HEMOGLOBIN A1C < 7.0          . LDL CALC < 100          . LDL CALC < 130           Weight    . Weight < 200 lb (90.719 kg)  234 lb 6.4 oz (106.323 kg)   232 lb 8 oz (105.461 kg) 232 lb 8 oz (105.461 kg)      Progress Toward Treatment Goals:  Treatment Goal 11/06/2013  Hemoglobin A1C unchanged  Blood pressure unchanged    Self Care Goals & Plans:  Self Care Goal 11/06/2013  Manage my medications take my medicines as prescribed; bring my medications to every visit; refill my medications on time  Monitor my health keep track of my blood pressure; keep track of my weight; check my feet daily  Eat healthy foods drink diet soda or water instead of juice or soda; eat more vegetables; eat foods that are low in salt; eat baked foods instead of fried foods; eat fruit for snacks and desserts; eat smaller portions  Be physically active find an activity I enjoy  Meeting treatment goals maintain the current self-care plan    Home Blood Glucose Monitoring 11/06/2013  Check my blood sugar no home glucose monitoring  When to check my blood sugar N/A     Care Management & Community Referrals:  Referral 11/06/2013  Referrals made for care management support none needed  Referrals made to community resources none       DASH Eating Plan DASH stands for "Dietary Approaches to Stop Hypertension." The DASH  eating plan is a healthy eating plan that has been shown to reduce high blood pressure (hypertension). Additional health benefits may include reducing the risk of type 2 diabetes mellitus, heart disease, and stroke. The DASH eating plan may also help with weight loss. WHAT DO I NEED TO KNOW ABOUT THE DASH EATING PLAN? For the DASH eating plan, you will follow these general guidelines:  Choose foods with a percent daily value for sodium of less than 5% (as listed on the food label).  Use salt-free seasonings or herbs instead of table salt or sea salt.  Check with your health care provider or pharmacist before using salt substitutes.  Eat lower-sodium products, often labeled as "lower sodium" or "no salt added."  Eat fresh foods.  Eat more vegetables, fruits, and low-fat dairy products.  Choose whole grains. Look for the word "whole" as the first word in the ingredient list.  Choose fish and skinless chicken or [place] more often than red meat. Limit fish, poultry, and meat to 6 oz (170 g) each day.  Limit sweets, desserts, sugars, and sugary drinks.  Choose heart-healthy fats.  Limit cheese  to 1 oz (28 g) per day.  Eat more home-cooked food and less restaurant, buffet, and fast food.  Limit fried foods.  Cook foods using methods other than frying.  Limit canned vegetables. If you do use them, rinse them well to decrease the sodium.  When eating at a restaurant, ask that your food be prepared with less salt, or no salt if possible. WHAT FOODS CAN I EAT? Seek help from a dietitian for individual calorie needs. Grains Whole grain or whole wheat bread. Brown rice. Whole grain or whole wheat pasta. Quinoa, bulgur, and whole grain cereals. Low-sodium cereals. Corn or whole wheat flour tortillas. Whole grain cornbread. Whole grain crackers. Low-sodium crackers. Vegetables Fresh or frozen vegetables (raw, steamed, roasted, or grilled). Low-sodium or reduced-sodium tomato and vegetable  juices. Low-sodium or reduced-sodium tomato sauce and paste. Low-sodium or reduced-sodium canned vegetables.  Fruits All fresh, canned (in natural juice), or frozen fruits. Meat and Other Protein Products Ground beef (85% or leaner), grass-fed beef, or beef trimmed of fat. Skinless chicken or [place]. Ground chicken or [place]. Pork trimmed of fat. All fish and seafood. Eggs. Dried beans, peas, or lentils. Unsalted nuts and seeds. Unsalted canned beans. Dairy Low-fat dairy products, such as skim or 1% milk, 2% or reduced-fat cheeses, low-fat ricotta or cottage cheese, or plain low-fat yogurt. Low-sodium or reduced-sodium cheeses. Fats and Oils Tub margarines without trans fats. Light or reduced-fat mayonnaise and salad dressings (reduced sodium). Avocado. Safflower, olive, or canola oils. Natural peanut or almond butter. Other Unsalted popcorn and pretzels. The items listed above may not be a complete list of recommended foods or beverages. Contact your dietitian for more options. WHAT FOODS ARE NOT RECOMMENDED? Grains White bread. White pasta. White rice. Refined cornbread. Bagels and croissants. Crackers that contain trans fat. Vegetables Creamed or fried vegetables. Vegetables in a cheese sauce. Regular canned vegetables. Regular canned tomato sauce and paste. Regular tomato and vegetable juices. Fruits Dried fruits. Canned fruit in light or heavy syrup. Fruit juice. Meat and Other Protein Products Fatty cuts of meat. Ribs, chicken wings, bacon, sausage, bologna, salami, chitterlings, fatback, hot dogs, bratwurst, and packaged luncheon meats. Salted nuts and seeds. Canned beans with salt. Dairy Whole or 2% milk, cream, half-and-half, and cream cheese. Whole-fat or sweetened yogurt. Full-fat cheeses or blue cheese. Nondairy creamers and whipped toppings. Processed cheese, cheese spreads, or cheese curds. Condiments Onion and garlic salt, seasoned salt, table salt, and sea salt. Canned and  packaged gravies. Worcestershire sauce. Tartar sauce. Barbecue sauce. Teriyaki sauce. Soy sauce, including reduced sodium. Steak sauce. Fish sauce. Oyster sauce. Cocktail sauce. Horseradish. Ketchup and mustard. Meat flavorings and tenderizers. Bouillon cubes. Hot sauce. Tabasco sauce. Marinades. Taco seasonings. Relishes. Fats and Oils Butter, stick margarine, lard, shortening, ghee, and bacon fat. Coconut, palm kernel, or palm oils. Regular salad dressings. Other Pickles and olives. Salted popcorn and pretzels. The items listed above may not be a complete list of foods and beverages to avoid. Contact your dietitian for more information. WHERE CAN I FIND MORE INFORMATION? National Heart, Lung, and Blood Institute: [email or web address] Document Released: 04/14/2011 Document Revised: 04/30/2013 Document Reviewed: 02/27/2013 [facility] Patient Information 2015 [place], [place]. This information is not intended to replace advice given to you by your health care provider. Make sure you discuss any questions you have with your health care provider.  Hypertension Hypertension, commonly called high blood pressure, is when the force of blood pumping through your arteries is too strong. Your arteries are the blood vessels that  carry blood from your heart throughout your body. A blood pressure reading consists of a higher number over a lower number, such as 110/72. The higher number (systolic) is the pressure inside your arteries when your heart pumps. The lower number (diastolic) is the pressure inside your arteries when your heart relaxes. Ideally you want your blood pressure below 120/80. Hypertension forces your heart to work harder to pump blood. Your arteries may become narrow or stiff. Having hypertension puts you at risk for heart disease, stroke, and other problems.  RISK FACTORS Some risk factors for high blood pressure are controllable. Others are not.  Risk factors you  cannot control include:   Race. You may be at higher risk if you are African American.  Age. Risk increases with age.  Gender. Men are at higher risk than women before age 19 years. After age 48, women are at higher risk than men. Risk factors you can control include:  Not getting enough exercise or physical activity.  Being overweight.  Getting too much fat, sugar, calories, or salt in your diet.  Drinking too much alcohol. SIGNS AND SYMPTOMS Hypertension does not usually cause signs or symptoms. Extremely high blood pressure (hypertensive crisis) may cause headache, anxiety, shortness of breath, and nosebleed. DIAGNOSIS  To check if you have hypertension, your health care provider will measure your blood pressure while you are seated, with your arm held at the level of your heart. It should be measured at least twice using the same arm. Certain conditions can cause a difference in blood pressure between your right and left arms. A blood pressure reading that is higher than normal on one occasion does not mean that you need treatment. If one blood pressure reading is high, ask your health care provider about having it checked again. TREATMENT  Treating high blood pressure includes making lifestyle changes and possibly taking medication. Living a healthy lifestyle can help lower high blood pressure. You may need to change some of your habits. Lifestyle changes may include:  Following the DASH diet. This diet is high in fruits, vegetables, and whole grains. It is low in salt, red meat, and added sugars.  Getting at least 2 1/2 hours of brisk physical activity every week.  Losing weight if necessary.  Not smoking.  Limiting alcoholic beverages.  Learning ways to reduce stress. If lifestyle changes are not enough to get your blood pressure under control, your health care provider may prescribe medicine. You may need to take more than one. Work closely with your health care provider  to understand the risks and benefits. HOME CARE INSTRUCTIONS  Have your blood pressure rechecked as directed by your health care provider.   Only take medicine as directed by your health care provider. Follow the directions carefully. Blood pressure medicines must be taken as prescribed. The medicine does not work as well when you skip doses. Skipping doses also puts you at risk for problems.   Do not smoke.   Monitor your blood pressure at home as directed by your health care provider. SEEK MEDICAL CARE IF:   You think you are having a reaction to medicines taken.  You have recurrent headaches or feel dizzy.  You have swelling in your ankles.  You have trouble with your vision. SEEK IMMEDIATE MEDICAL CARE IF:  You develop a severe headache or confusion.  You have unusual weakness, numbness, or feel faint.  You have severe chest or abdominal pain.  You vomit repeatedly.  You have trouble  breathing. MAKE SURE YOU:   Understand these instructions.  Will watch your condition.  Will get help right away if you are not doing well or get worse. Document Released: 04/25/2005 Document Revised: 04/30/2013 Document Reviewed: 02/15/2013 [facility] Patient Information 2015 [place] [employer], [place]. This information is not intended to replace advice given to you by your health care provider. Make sure you discuss any questions you have with your health care provider. Orthostatic Hypotension Orthostatic hypotension is a sudden drop in blood pressure. It happens when you quickly stand up from a seated or lying position. You may feel dizzy or light-headed. This can last for just a few seconds or for up to a few minutes. It is usually not a serious problem. However, if this happens frequently or gets worse, it can be a sign of something more serious. CAUSES  Different things can cause orthostatic hypotension, including:   Loss of body fluids (dehydration).  Medicines that lower blood  pressure.  Sudden changes in posture, such as standing up quickly after you have been sitting or lying down.  Taking too much of your medicine. SIGNS AND SYMPTOMS   Light-headedness or dizziness.   Fainting or near-fainting.   A fast heart rate.   Weakness.   Feeling tired (fatigue).  DIAGNOSIS  Your health care provider may do several things to help diagnose your condition and identify the cause. These may include:   Taking a medical history and doing a physical exam.  Checking your blood pressure. Your health care provider will check your blood pressure when you are:  Lying down.  Sitting.  Standing.  Using tilt table testing. In this test, you lie down on a table that moves from a lying position to a standing position. You will be strapped onto the table. This test monitors your blood pressure and heart rate when you are in different positions. TREATMENT  Treatment will vary depending on the cause. Possible treatments include:   Changing the dosage of your medicines.  Wearing compression stockings on your lower legs.  Standing up slowly after sitting or lying down.  Eating more salt.  Eating frequent, small meals.  In some cases, getting IV fluids.  Taking medicine to enhance fluid retention. HOME CARE INSTRUCTIONS  Only take over-the-counter or prescription medicines as directed by your health care provider.  Follow your health care provider's instructions for changing the dosage of your current medicines.  Do not stop or adjust your medicine on your own.  Stand up slowly after sitting or lying down. This allows your body to adjust to the different position.  Wear compression stockings as directed.  Eat extra salt as directed.  Do not add extra salt to your diet unless directed to by your health care provider.  Eat frequent, small meals.  Avoid standing suddenly after eating.  Avoid hot showers or excessive heat as directed by your health  care provider.  Keep all follow-up appointments. SEEK MEDICAL CARE IF:  You continue to feel dizzy or light-headed after standing.  You feel groggy or confused.  You feel cold, clammy, or sick to your stomach (nauseous).  You have blurred vision.  You feel short of breath. SEEK IMMEDIATE MEDICAL CARE IF:   You faint after standing.  You have chest pain.  You have difficulty breathing.   You lose feeling or movement in your arms or legs.   You have slurred speech or difficulty talking, or you are unable to talk.  MAKE SURE YOU:   Understand  these instructions.  Will watch your condition.  Will get help right away if you are not doing well or get worse. Document Released: 04/15/2002 Document Revised: 04/30/2013 Document Reviewed: 02/15/2013 [facility] Patient Information 2015 [place], [place]. This information is not intended to replace advice given to you by your health care provider. Make sure you discuss any questions you have with your health care provider.  Erectile Dysfunction Erectile dysfunction is the inability to get or sustain a good enough erection to have sexual intercourse. Erectile dysfunction may involve:  Inability to get an erection.  Lack of enough hardness to allow penetration.  Loss of the erection before sex is finished.  Premature ejaculation. CAUSES  Certain drugs, such as:  Pain relievers.  Antihistamines.  Antidepressants.  Blood pressure medicines.  Water pills (diuretics).  Ulcer medicines.  Muscle relaxants.  Illegal drugs.  Excessive drinking.  Psychological causes, such as:  Anxiety.  Depression.  Sadness.  Exhaustion.  Performance fear.  Stress.  Physical causes, such as:  Artery problems. This may include diabetes, smoking, liver disease, or atherosclerosis.  High blood pressure.  Hormonal problems, such as low testosterone.  Obesity.  Nerve problems. This may include back or pelvic injuries,  diabetes mellitus, multiple sclerosis, or Parkinson disease. SYMPTOMS  Inability to get an erection.  Lack of enough hardness to allow penetration.  Loss of the erection before sex is finished.  Premature ejaculation.  Normal erections at some times, but with frequent unsatisfactory episodes.  Orgasms that are not satisfactory in sensation or frequency.  Low sexual satisfaction in either partner because of erection problems.  A curved penis occurring with erection. The curve may cause pain or may be too curved to allow for intercourse.  Never having nighttime erections. DIAGNOSIS Your caregiver can often diagnose this condition by:  Performing a physical exam to find other diseases or specific problems with the penis.  Asking you detailed questions about the problem.  Performing blood tests to check for diabetes mellitus or to measure hormone levels.  Performing urine tests to find other underlying health conditions.  Performing an ultrasound exam to check for scarring.  Performing a test to check blood flow to the penis.  Doing a sleep study at home to measure nighttime erections. TREATMENT   You may be prescribed medicines by mouth.  You may be given medicine injections into the penis.  You may be prescribed a vacuum pump with a ring.  Penile implant surgery may be performed. You may receive:  An inflatable implant.  A semirigid implant.  Blood vessel surgery may be performed. HOME CARE INSTRUCTIONS  If you are prescribed oral medicine, you should take the medicine as prescribed. Do not increase the dosage without first discussing it with your physician.  If you are using self-injections, be careful to avoid any veins that are on the surface of the penis. Apply pressure to the injection site for 5 minutes.  If you are using a vacuum pump, make sure you have read the instructions before using it. Discuss any questions with your physician before taking the  pump home. SEEK MEDICAL CARE IF:  You experience pain that is not responsive to the pain medicine you have been prescribed.  You experience nausea or vomiting. SEEK IMMEDIATE MEDICAL CARE IF:   When taking oral or injectable medications, you experience an erection that lasts longer than 4 hours. If your physician is unavailable, go to the nearest emergency room for evaluation. An erection that lasts much longer than  4 hours can result in permanent damage to your penis.  You have pain that is severe.  You develop redness, severe pain, or severe swelling of your penis.  You have redness spreading up into your groin or lower abdomen.  You are unable to pass your urine. Document Released: 04/22/2000 Document Revised: 12/26/2012 Document Reviewed: 09/27/2012 [facility] Patient Information 2015 [place], [place]. This information is not intended to replace advice given to you by your health care provider. Make sure you discuss any questions you have with your health care provider.  Sildenafil tablets (Viagra) What is this medicine? SILDENAFIL (sil DEN a fil) is used to treat erection problems in men. This medicine may be used for other purposes; ask your health care provider or pharmacist if you have questions. COMMON BRAND NAME(S): Viagra What should I tell my health care provider before I take this medicine? They need to know if you have any of these conditions: -bleeding disorders -eye or vision problems, including a rare inherited eye disease called retinitis pigmentosa -anatomical deformation of the penis, Peyronie's disease, or history of priapism (painful and prolonged erection) -heart disease, angina, a history of heart attack, irregular heart beats, or other heart problems -high or low blood pressure -history of blood diseases, like sickle cell anemia or leukemia -history of stomach bleeding -kidney disease -liver disease -stroke -an unusual or allergic reaction to sildenafil,  other medicines, foods, dyes, or preservatives -pregnant or trying to get pregnant -breast-feeding How should I use this medicine? Take this medicine by mouth with a glass of water. Follow the directions on the prescription label. The dose is usually taken 1 hour before sexual activity. You should not take the dose more than once per day. Do not take your medicine more often than directed. Talk to your pediatrician regarding the use of this medicine in children. This medicine is not used in children for this condition. Overdosage: If you think you have taken too much of this medicine contact a poison control center or emergency room at once. NOTE: This medicine is only for you. Do not share this medicine with others. What if I miss a dose? This does not apply. Do not take double or extra doses. What may interact with this medicine? Do not take this medicine with any of the following medications: -cisapride -methscopolamine nitrate -nitrates like amyl nitrite, isosorbide dinitrate, isosorbide mononitrate, nitroglycerin -nitroprusside -other medicines for erectile dysfunction like avanafil, tadalafil, vardenafil -other sildenafil products (Revatio) This medicine may also interact with the following medications: -certain drugs for high blood pressure -certain drugs for the treatment of HIV infection or AIDS -certain drugs used for fungal or yeast infections, like fluconazole, itraconazole, ketoconazole, and voriconazole -cimetidine -erythromycin -rifampin This list may not describe all possible interactions. Give your health care provider a list of all the medicines, herbs, non-prescription drugs, or dietary supplements you use. Also tell them if you smoke, drink alcohol, or use illegal drugs. Some items may interact with your medicine. What should I watch for while using this medicine? If you notice any changes in your vision while taking this drug, call your doctor or health care  professional as soon as possible. Stop using this medicine and call your health care provider right away if you have a loss of sight in one or both eyes. Contact your doctor or health care professional right away if you have an erection that lasts longer than 4 hours or if it becomes painful. This may be a sign of a serious  problem and must be treated right away to prevent permanent damage. If you experience symptoms of nausea, dizziness, chest pain or arm pain upon initiation of sexual activity after taking this medicine, you should refrain from further activity and call your doctor or health care professional as soon as possible. Do not drink alcohol to excess (examples, 5 glasses of wine or 5 shots of whiskey) when taking this medicine. When taken in excess, alcohol can increase your chances of getting a headache or getting dizzy, increasing your heart rate or lowering your blood pressure. Using this medicine does not protect you or your partner against HIV infection (the virus that causes AIDS) or other sexually transmitted diseases. What side effects may I notice from receiving this medicine? Side effects that you should report to your doctor or health care professional as soon as possible: -allergic reactions like skin rash, itching or hives, swelling of the face, lips, or tongue -breathing problems -changes in hearing -changes in vision -chest pain -fast, irregular heartbeat -prolonged or painful erection -seizures Side effects that usually do not require medical attention (report to your doctor or health care professional if they continue or are bothersome): -back pain -dizziness -flushing -headache -indigestion -muscle aches -nausea -stuffy or runny nose This list may not describe all possible side effects. Call your doctor for medical advice about side effects. You may report side effects to FDA at 1-800-FDA-1088. Where should I keep my medicine? Keep out of reach of  children. Store at room temperature between 15 and 30 degrees C (59 and 86 degrees F). Throw away any unused medicine after the expiration date. NOTE: This sheet is a summary. It may not cover all possible information. If you have questions about this medicine, talk to your doctor, pharmacist, or health care provider.  2015, [employer]. (2012-04-25 12:43:54)

## 2013-11-06 NOTE — Assessment & Plan Note (Signed)
Lab Results  Component Value Date   HGBA1C 7.7 10/02/2013   HGBA1C 7.7 07/10/2013   HGBA1C 7.0 02/06/2013     Assessment: Diabetes control: fair control Progress toward A1C goal:  unchanged Comments: encouraged compliance with Glipizide, pt currently not taking but will pick up from pharmacy   Plan: Medications:  continue current medications (Met 1000 mg bid, Glipizide 5 mg qd) Home glucose monitoring: Frequency: no home glucose monitoring Timing: N/A Instruction/counseling given: other instruction/counseling: med compliance  Other plans: f/u with PCP

## 2013-11-06 NOTE — Assessment & Plan Note (Signed)
Etiology could be related to DM, medications  Orthostatic today by VS but w/o symptoms  Reassess at f/u esp with addition of Lisinopril for BP.

## 2013-11-06 NOTE — Assessment & Plan Note (Signed)
Resolved as of today

## 2013-11-07 NOTE — Progress Notes (Signed)
Case discussed with Dr. McLean soon after the resident saw the patient.  We reviewed the resident's history and exam and pertinent patient test results.  I agree with the assessment, diagnosis, and plan of care documented in the resident's note. 

## 2013-11-14 ENCOUNTER — Emergency Department (HOSPITAL_COMMUNITY)
Admission: EM | Admit: 2013-11-14 | Discharge: 2013-11-14 | Disposition: A | Discharge status: Home / Self Care | Payer: Medicare Other | Attending: Emergency Medicine | Admitting: Emergency Medicine

## 2013-11-14 ENCOUNTER — Emergency Department (HOSPITAL_COMMUNITY): Payer: Medicare Other

## 2013-11-14 ENCOUNTER — Encounter (HOSPITAL_COMMUNITY): Payer: Self-pay | Admitting: Emergency Medicine

## 2013-11-14 DIAGNOSIS — I2510 Atherosclerotic heart disease of native coronary artery without angina pectoris: Secondary | ICD-10-CM | POA: Insufficient documentation

## 2013-11-14 DIAGNOSIS — E119 Type 2 diabetes mellitus without complications: Secondary | ICD-10-CM | POA: Insufficient documentation

## 2013-11-14 DIAGNOSIS — Z8739 Personal history of other diseases of the musculoskeletal system and connective tissue: Secondary | ICD-10-CM | POA: Insufficient documentation

## 2013-11-14 DIAGNOSIS — M109 Gout, unspecified: Secondary | ICD-10-CM | POA: Insufficient documentation

## 2013-11-14 DIAGNOSIS — Z8701 Personal history of pneumonia (recurrent): Secondary | ICD-10-CM | POA: Insufficient documentation

## 2013-11-14 DIAGNOSIS — Z87891 Personal history of nicotine dependence: Secondary | ICD-10-CM | POA: Insufficient documentation

## 2013-11-14 DIAGNOSIS — M10432 Other secondary gout, left wrist: Secondary | ICD-10-CM

## 2013-11-14 DIAGNOSIS — I10 Essential (primary) hypertension: Secondary | ICD-10-CM | POA: Insufficient documentation

## 2013-11-14 DIAGNOSIS — Z79899 Other long term (current) drug therapy: Secondary | ICD-10-CM | POA: Insufficient documentation

## 2013-11-14 DIAGNOSIS — I252 Old myocardial infarction: Secondary | ICD-10-CM | POA: Insufficient documentation

## 2013-11-14 MED ORDER — COLCHICINE 0.6 MG PO TABS: ORAL_TABLET | ORAL | Status: DC

## 2013-11-14 MED ORDER — NAPROXEN 500 MG PO TABS: 500.0000 mg | ORAL_TABLET | Freq: Two times a day (BID) | ORAL | Status: DC | PRN

## 2013-11-14 MED ORDER — OXYCODONE-ACETAMINOPHEN 5-325 MG PO TABS: 1.0000 | ORAL_TABLET | Freq: Four times a day (QID) | ORAL | Status: DC | PRN

## 2013-11-14 MED ORDER — KETOROLAC TROMETHAMINE 30 MG/ML IJ SOLN
30.0000 mg | Freq: Once | INTRAMUSCULAR | Status: AC
  Administered 2013-11-14: 30 mg via INTRAMUSCULAR
  Filled 2013-11-14: qty 1

## 2013-11-14 NOTE — ED Notes (Signed)
PA notified of high BP

## 2013-11-14 NOTE — ED Notes (Signed)
Patient states he was "shoveling gravel yesterday for about 8 hours".   Patient denies injury though.

## 2013-11-14 NOTE — ED Notes (Signed)
Per pt sts his gout has flared up in his left hand and wrist. sts hx of the same in the same wrist.

## 2013-11-14 NOTE — ED Provider Notes (Signed)
CSN: [id number]     Arrival date & time 11/14/13  1241 History   This chart was scribed for [employer], PA-C  working with Alfonzo Feller, DO by Stacy Gardner, ED scribe. This patient was seen in room TR08C/TR08C and the patient's care was started at 1:22 PM.  First MD Initiated Contact with Patient 11/14/13 1301     Chief Complaint  Patient presents with  . Gout     (Consider location/radiation/quality/duration/timing/severity/associated sxs/prior Treatment) Patient is a 58 y.o. male presenting with wrist pain. The history is provided by the patient and medical records. No language interpreter was used.  Wrist Pain This is a recurrent problem. The current episode started today. The problem occurs constantly. The problem has been unchanged. Associated symptoms include arthralgias (L wrist) and joint swelling (L wrist). Pertinent negatives include no abdominal pain, chest pain, chills, diaphoresis, fever, headaches, myalgias, nausea, neck pain, numbness, rash, urinary symptoms, vertigo, visual change, vomiting or weakness. The symptoms are aggravated by bending (bending wrist). Treatments tried: extra allopurinol dose. The treatment provided no relief.   HPI Comments: Daniel Perkins is a 57 y.o. male with hx of DM and Gout presents to the Emergency Department complaining of gout flare- up to his left hand and wrist. He explains the pain started in his wrist yesterday and now the pain radiates into his hand. The pain is progressively worsening and 10/10 in severity. He explains the pain makes him cry. Pt is unable to lift his hand due to pain, as the pain is worse with movement. Tried using an extra dose of allopurinol but did not have any relief. Endorses that he shoveled gravel for 8 hours but does not recall injuring his wrist. States he has been drinking more frequently in the recent weeks, and drank two cans of beer yesterday. He endorses compliance with his allopurinol.    Denies eating shrimp or excessive amount of red meat recently.  Denies fevers/chills, HA, vision changes, CP, SOB, abd pain, N/V/D, shoulder or elbow pain, dysuria, hematuria, red streaking up his arm, numbness and tingling to his extremities, back or neck pain, or erythema of his skin.  Pt mentions having prior gout to his wrist and hand. Denies having any joint fluid tests. Of note, pt saw his PCP last week and was prescribed an additional BP medication but he has not started taking it as he has not been able to fill it. He endorses symptoms occur occasionally, but today he is not having any symptoms of his high BP.    Past Medical History  Diagnosis Date  . Coronary artery disease     Holter monitor 02/2012 - sinus with rare PVC  . Gout   . Hyperlipidemia   . Hypertension   . Osteomyelitis   . CTEV (congenital talipes equinovarus)   . Myocardial infarction ~ 2007    "mild"  . Type II diabetes mellitus   . Club foot of both lower extremities 1958  . Pneumonia 2000's    "couple times"   Past Surgical History  Procedure Laterality Date  . Anterior cervical decomp/discectomy fusion  X 2  . Foot surgery Left x16    "joints collapsed; keep getting infected"  . Posterior cervical fusion/foraminotomy  X 2  . Foot fracture surgery Right 1990's    "pt a steel plate in"  . Cardiac catheterization     Family History  Problem Relation Age of Onset  . Diabetes Mother   . Coronary artery disease Mother  HAS PACEMAKER  . Heart disease Mother   . Heart attack Brother    History  Substance Use Topics  . Smoking status: Former Smoker -- 1.00 packs/day for 32 years    Types: Cigarettes    Quit date: 07/06/2007  . Smokeless tobacco: Never Used  . Alcohol Use: Yes     Comment: "usually drink one small can beer/year; birthday or superbowl"    Review of Systems  Constitutional: Negative for fever, chills and diaphoresis.  Eyes: Negative for pain and visual disturbance.  Respiratory:  Negative for chest tightness, shortness of breath and wheezing.   Cardiovascular: Negative for chest pain, palpitations and leg swelling.  Gastrointestinal: Negative for nausea, vomiting, abdominal pain, diarrhea and constipation.  Genitourinary: Negative for dysuria, hematuria and difficulty urinating.  Musculoskeletal: Positive for arthralgias (L wrist) and joint swelling (L wrist). Negative for back pain, myalgias, neck pain and neck stiffness.  Skin: Positive for color change (L wrist slightly red). Negative for rash.  Neurological: Negative for dizziness, vertigo, syncope, weakness, numbness and headaches.  Psychiatric/Behavioral: Negative for confusion.  All other systems reviewed and are negative.     Allergies  Morphine and Pravastatin sodium  Home Medications   Prior to Admission medications   Medication Sig Start Date End Date Taking? Authorizing Provider  albuterol (PROVENTIL HFA;VENTOLIN HFA) 108 (90 BASE) MCG/ACT inhaler Inhale 1-2 puffs into the lungs every 6 (six) hours as needed for wheezing or shortness of breath. 08/22/13   Fredia Sorrow, MD  allopurinol (ZYLOPRIM) 100 MG tablet Take 1 tablet (100 mg total) by mouth daily. 11/06/13   Cresenciano Genre, MD  Blood Glucose Monitoring Suppl (ONE TOUCH ULTRA 2) W/DEVICE KIT Check blood sugar twice daily as instructed dx code 250.00 07/10/13   Jessee Avers, MD  colchicine 0.6 MG tablet Take one tablet now, then one hour later take one more tablet. Beginning the following day, take one table by mouth daily, for 5 days 11/14/13   Patty Sermons Camprubi-Soms, PA-C  dextromethorphan-guaiFENesin (MUCINEX DM) 30-600 MG per 12 hr tablet Take 1 tablet by mouth 2 (two) times daily. 08/22/13   Fredia Sorrow, MD  glipiZIDE (GLUCOTROL) 5 MG tablet Take 1 tablet (5 mg total) by mouth daily before breakfast. 10/02/13 10/02/14  Jessee Avers, MD  glucose blood (ONE TOUCH ULTRA TEST) test strip Check blood sugar twice daily as instructed dx code  250.00 07/10/13   Jessee Avers, MD  Lancet Devices MISC 1 kit by Does not apply route 3 (three) times daily. 06/29/12   Jessee Avers, MD  lisinopril (PRINIVIL,ZESTRIL) 10 MG tablet Take 1 tablet (10 mg total) by mouth daily. 10/23/13   Jessee Avers, MD  metFORMIN (GLUCOPHAGE) 1000 MG tablet Take 1 tablet (1,000 mg total) by mouth 2 (two) times daily with a meal. 07/10/13   Jessee Avers, MD  methocarbamol (ROBAXIN) 500 MG tablet Take 2 tablets (1,000 mg total) by mouth every 8 (eight) hours as needed for muscle spasms. 10/02/13   Jessee Avers, MD  metoprolol tartrate (LOPRESSOR) 25 MG tablet Take 1 tablet (25 mg total) by mouth 2 (two) times daily. 10/11/13   Rebecca Eaton, MD  naproxen (NAPROSYN) 500 MG tablet Take 1 tablet (500 mg total) by mouth 2 (two) times daily as needed for mild pain or moderate pain (TAKE WITH MEALS.). 11/14/13   Tyaisha Cullom Strupp Camprubi-Soms, PA-C  ONETOUCH DELICA LANCETS FINE MISC Check blood sugar twice daily as instructed dx code 250.00 07/10/13   Jessee Avers, MD  oxyCODONE-acetaminophen (  PERCOCET) 5-325 MG per tablet Take 1-2 tablets by mouth every 6 (six) hours as needed for severe pain. 11/14/13   Prince Olivier Strupp Camprubi-Soms, PA-C  sildenafil (VIAGRA) 50 MG tablet Take 1 tablet (50 mg total) by mouth daily as needed for erectile dysfunction. 11/06/13   Cresenciano Genre, MD   BP 191/100  Pulse 95  Temp(Src) 97.8 F (36.6 C) (Oral)  Resp 20  SpO2 100% Physical Exam  Nursing note and vitals reviewed. Constitutional: He is oriented to person, place, and time. He appears well-developed and well-nourished.  Non-toxic appearance. No distress.  Hypertensive, visibly uncomfortable. Afebrile, non-toxic  HENT:  Head: Normocephalic and atraumatic.  Mouth/Throat: Mucous membranes are normal.  Eyes: Conjunctivae, EOM and lids are normal. Pupils are equal, round, and reactive to light.  Neck: Normal range of motion. Neck supple. No spinous process tenderness and no  muscular tenderness present. No rigidity. Normal range of motion present.  Cspine non-TTP over spinous processes and paracervical muscles. ROM intact. No meningeal signs.  Cardiovascular: Normal rate, regular rhythm, normal heart sounds and intact distal pulses.  Exam reveals no gallop.   No murmur heard. RRR, nl s1/s2, no m/r/g, distal pulses intact and equal in all extremities.  Pulmonary/Chest: Effort normal and breath sounds normal. No respiratory distress. He has no decreased breath sounds. He has no wheezes. He has no rhonchi. He has no rales.  Abdominal: Normal appearance. He exhibits no distension.  Musculoskeletal:       Left wrist: He exhibits decreased range of motion, tenderness and swelling. He exhibits no crepitus and no deformity.  L wrist with decreased ROM secondary to pain, TTP over carpals, mildly swollen and trace erythema extending over carpals, warm to touch, no crepitus or deformity. Cap refill <3 secs in all digits. Strength 5/5 in all extremities. L elbow and shoulder non-TTP, no erythema or edema.  Lymphadenopathy:    He has no axillary adenopathy.  No axillary LAD  Neurological: He is alert and oriented to person, place, and time. He has normal strength. No cranial nerve deficit or sensory deficit.  Strength 5/5 in all extremities, CNII-XII grossly intact, sensation grossly intact in all extremities. A&Ox4  Skin: Skin is warm and dry. There is erythema.     Warmth to touch over L wrist, trace erythema, mild swelling, TTP as described above.  Psychiatric: He has a normal mood and affect. Cognition and memory are normal.    ED Course  Procedures (including critical care time) DIAGNOSTIC STUDIES: Oxygen Saturation is 100% on room air, normal by my interpretation.    COORDINATION OF CARE:  1:25 PM Discussed course of care with pt which includes x-ray, Toradol and possible aspiration . Pt understands and agrees.  Labs Review Labs Reviewed - No data to  display  Imaging Review Dg Wrist Complete Left  11/14/2013   CLINICAL DATA:  Pain and swelling  EXAM: LEFT WRIST - COMPLETE 3+ VIEW  COMPARISON:  None.  FINDINGS: Irregularity of the distal ulna most likely due to old injury. Negative for acute fracture. No erosion. Carpal bones are normal.  Early arterial calcification.  IMPRESSION: Negative for acute fracture.   Electronically Signed   By: Franchot Gallo M.D.   On: 11/14/2013 13:56     EKG Interpretation None      MDM   Final diagnoses:  Other secondary acute gout of left wrist  HTN (hypertension), benign    Daniel Perkins is a 58 y.o. male with a PMHx of gout  and HTN presenting today with a flare of gout in his L wrist. States it has occurred here before, as well as in his toes. Takes allopurinol daily. Has never had aspiration to verify gout, but has done well with allopurinol. Recently increased his alcohol intake. Afebrile, non-toxic appearing, with trace erythema in joint and swelling, consistent with gout. Given that pt has a hx of gout and is non-toxic, doubt septic joint. Xray unremarkable, with no obvious effusion noted. I don't feel the joint has enough fluid to aspirate, and again given his hx of gout I don't feel there is a high likelihood of septic joint. For gout flare, given toradol here, as well as rx for colchicine, percocet, and naprosyn. Will have pt f/up with PCP and ortho.  Pt severely hypertensive throughout ED course, but asymptomatic with no s/sx of end-organ damage. Pt in a lot of pain, and toradol only helping a little, had no ride home therefore more powerful narcotics could not be given, but I believe part of the HTN is attributable to his level of pain. Pt admits his dr has recently added a med to his antiHTN drugs, but he hadn't gotten it at the pharmacy yet. Again, pt is asymptomatic today, and therefore no further work up was deemed necessary. Discussed case with Dr. Thurnell Garbe who agreed. Pt encouraged to see  PCP today to discuss ongoing HTN control. I explained the diagnosis and have given explicit precautions to return to the ER including for any other new or worsening symptoms. The patient understands and accepts the medical plan as it's been dictated and I have answered their questions. Discharge instructions concerning home care and prescriptions have been given. The patient is STABLE and is discharged to home in good condition.   I personally performed the services described in this documentation, which was scribed in my presence. The recorded information has been reviewed and is accurate.  BP 191/100  Pulse 95  Temp(Src) 97.8 F (36.6 C) (Oral)  Resp 20  SpO2 100%   Patty Sermons Camprubi-Soms, PA-C 11/14/13 [place], [place] 11/14/13 1858

## 2013-11-14 NOTE — Discharge Instructions (Signed)
Your gout has flared up, and you will need to take Colchicine as directed for the next 6 days. Take naprosyn and percocet for pain, as directed. See your regular doctor for your ongoing care of your blood pressure, which was very high here today. Return to the emergency department for any changing or worsening symptoms.   Gout Gout is when your joints become red, sore, and swell (inflammed). This is caused by the buildup of uric acid crystals in the joints. Uric acid is a chemical that is normally in the blood. If the level of uric acid gets too high in the blood, these crystals form in your joints and tissues. Over time, these crystals can form into masses near the joints and tissues. These masses can destroy bone and cause the bone to look misshapen (deformed). HOME CARE   Do not take aspirin for pain.  Only take medicine as told by your doctor.  Rest the joint as much as you can. When in bed, keep sheets and blankets off painful areas.  Keep the sore joints raised (elevated).  Put warm or cold packs on painful joints. Use of warm or cold packs depends on which works best for you.  Use crutches if the painful joint is in your leg.  Drink enough fluids to keep your pee (urine) clear or pale yellow. Limit alcohol, sugary drinks, and drinks with fructose in them.  Follow your diet instructions. Pay careful attention to how much protein you eat. Include fruits, vegetables, whole grains, and fat-free or low-fat milk products in your daily diet. Talk to your doctor or dietician about the use of coffee, vitamin C, and cherries. These may help lower uric acid levels.  Keep a healthy body weight. GET HELP RIGHT AWAY IF:   You have watery poop (diarrhea), throw up (vomit), or have any side effects from medicines.  You do not feel better in 24 hours, or you are getting worse.  Your joint becomes suddenly more tender, and you have chills or a fever. MAKE SURE YOU:   Understand these  instructions.  Will watch your condition.  Will get help right away if you are not doing well or get worse. Document Released: 02/02/2008 Document Revised: 08/20/2012 Document Reviewed: 08/03/2009 [facility] Patient Information 2015 [place], [place]. This information is not intended to replace advice given to you by your health care provider. Make sure you discuss any questions you have with your health care provider.  Low-Purine Diet Purines are compounds that affect the level of uric acid in your body. A low-purine diet is a diet that is low in purines. Eating a low-purine diet can prevent the level of uric acid in your body from getting too high and causing gout or kidney stones or both. WHAT DO I NEED TO KNOW ABOUT THIS DIET?  Choose low-purine foods. Examples of low-purine foods are listed in the next section.  Drink plenty of fluids, especially water. Fluids can help remove uric acid from your body. Try to drink 8-16 cups (1.9-3.8 L) a day.  Limit foods high in fat, especially saturated fat, as fat makes it harder for the body to get rid of uric acid. Foods high in saturated fat include pizza, cheese, ice cream, whole milk, fried foods, and gravies. Choose foods that are lower in fat and lean sources of protein. Use olive oil when cooking as it contains healthy fats that are not high in saturated fat.  Limit alcohol. Alcohol interferes with the elimination of uric  acid from your body. If you are having a gout attack, avoid all alcohol.  Keep in mind that different people's bodies react differently to different foods. You will probably learn over time which foods do or do not affect you. If you discover that a food tends to cause your gout to flare up, avoid eating that food. You can more freely enjoy foods that do not cause problems. If you have any questions about a food item, talk to your dietitian or health care provider. WHICH FOODS ARE LOW, MODERATE, AND HIGH IN PURINES? The following  is a list of foods that are low, moderate, and high in purines. You can eat any amount of the foods that are low in purines. You may be able to have small amounts of foods that are moderate in purines. Ask your health care provider how much of a food moderate in purines you can have. Avoid foods high in purines. Grains  Foods low in purines: Enriched white bread, pasta, rice, cake, cornbread, popcorn.  Foods moderate in purines: Whole-grain breads and cereals, wheat germ, bran, oatmeal. Uncooked oatmeal. Dry wheat bran or wheat germ.  Foods high in purines: Pancakes, [place] toast, biscuits, muffins. Vegetables  Foods low in purines: All vegetables, except those that are moderate in purines.  Foods moderate in purines: Asparagus, cauliflower, spinach, mushrooms, green peas. Fruits  All fruits are low in purines. Meats and other Protein Foods  Foods low in purines: Eggs, nuts, peanut butter.  Foods moderate in purines: 80-90% lean beef, lamb, veal, pork, poultry, fish, eggs, peanut butter, nuts. Crab, lobster, oysters, and shrimp. Cooked dried beans, peas, and lentils.  Foods high in purines: Anchovies, sardines, herring, mussels, tuna, codfish, scallops, trout, and haddock. Daniel Perkins. Organ meats (such as liver or kidney). Tripe. Game meat. Goose. Sweetbreads. Dairy  All dairy foods are low in purines. Low-fat and fat-free dairy products are best because they are low in saturated fat. Beverages  Drinks low in purines: Water, carbonated beverages, tea, coffee, cocoa.  Drinks moderate in purines: Soft drinks and other drinks sweetened with high-fructose corn syrup. Juices. To find whether a food or drink is sweetened with high-fructose corn syrup, look at the ingredients list.  Drinks high in purines: Alcoholic beverages (such as beer). Condiments  Foods low in purines: Salt, herbs, olives, pickles, relishes, vinegar.  Foods moderate in purines: Butter, margarine, oils,  mayonnaise. Fats and Oils  Foods low in purines: All types, except gravies and sauces made with meat.  Foods high in purines: Gravies and sauces made with meat. Other Foods  Foods low in purines: Sugars, sweets, gelatin. Cake. Soups made without meat.  Foods moderate in purines: Meat-based or fish-based soups, broths, or bouillons. Foods and drinks sweetened with high-fructose corn syrup.  Foods high in purines: High-fat desserts (such as ice cream, cookies, cakes, pies, doughnuts, and chocolate). Contact your dietitian for more information on foods that are not listed here. Document Released: 08/20/2010 Document Revised: 04/30/2013 Document Reviewed: 04/01/2013 [facility] Patient Information 2015 [place], [place]. This information is not intended to replace advice given to you by your health care provider. Make sure you discuss any questions you have with your health care provider.  Hypertension Hypertension is another name for high blood pressure. High blood pressure forces your heart to work harder to pump blood. A blood pressure reading has two numbers, which includes a higher number over a lower number (example: 110/72). HOME CARE   Have your blood pressure rechecked by your doctor.  Only take medicine as told by your doctor. Follow the directions carefully. The medicine does not work as well if you skip doses. Skipping doses also puts you at risk for problems.  Do not smoke.  Monitor your blood pressure at home as told by your doctor. GET HELP IF:  You think you are having a reaction to the medicine you are taking.  You have repeat headaches or feel dizzy.  You have puffiness (swelling) in your ankles.  You have trouble with your vision. GET HELP RIGHT AWAY IF:   You get a very bad headache and are confused.  You feel weak, numb, or faint.  You get chest or belly (abdominal) pain.  You throw up (vomit).  You cannot breathe very well. MAKE SURE YOU:   Understand  these instructions.  Will watch your condition.  Will get help right away if you are not doing well or get worse. Document Released: 10/12/2007 Document Revised: 04/30/2013 Document Reviewed: 02/15/2013 [facility] Patient Information 2015 [place]. This information is not intended to replace advice given to you by your health care provider. Make sure you discuss any questions you have with your health care provider.

## 2013-11-15 ENCOUNTER — Encounter: Payer: Self-pay | Admitting: Internal Medicine

## 2013-11-15 ENCOUNTER — Ambulatory Visit (INDEPENDENT_AMBULATORY_CARE_PROVIDER_SITE_OTHER): Payer: Medicare Other | Admitting: Internal Medicine

## 2013-11-15 VITALS — BP 168/98 | HR 87 | Temp 97.1°F | Ht 68.0 in | Wt 227.3 lb

## 2013-11-15 DIAGNOSIS — M109 Gout, unspecified: Secondary | ICD-10-CM

## 2013-11-15 DIAGNOSIS — I10 Essential (primary) hypertension: Secondary | ICD-10-CM

## 2013-11-15 DIAGNOSIS — E1149 Type 2 diabetes mellitus with other diabetic neurological complication: Secondary | ICD-10-CM

## 2013-11-15 DIAGNOSIS — M10432 Other secondary gout, left wrist: Secondary | ICD-10-CM

## 2013-11-15 LAB — GLUCOSE, CAPILLARY: Glucose-Capillary: 165 mg/dL — ABNORMAL HIGH (ref 70–99)

## 2013-11-15 MED ORDER — KETOROLAC TROMETHAMINE 30 MG/ML IJ SOLN
30.0000 mg | Freq: Once | INTRAMUSCULAR | Status: AC
  Administered 2013-11-15: 30 mg via INTRAMUSCULAR

## 2013-11-15 MED ORDER — NAPROXEN 500 MG PO TABS: 500.0000 mg | ORAL_TABLET | Freq: Two times a day (BID) | ORAL | Status: DC | PRN

## 2013-11-15 MED ORDER — LISINOPRIL 10 MG PO TABS: 10.0000 mg | ORAL_TABLET | Freq: Every day | ORAL | Status: DC

## 2013-11-15 MED ORDER — GLIPIZIDE 5 MG PO TABS: 5.0000 mg | ORAL_TABLET | Freq: Every day | ORAL | Status: DC

## 2013-11-15 NOTE — Progress Notes (Signed)
Pt stated he has not taken Lisinopril nor Glipizide b/e [facility] pharmacy did not received the rxs. I called these rxs to the pharmacy; pt awared.

## 2013-11-15 NOTE — Patient Instructions (Signed)
Take all the medications as recommended below. Pick up the new prescription from the pharmacy. If you have any fever, chills or if you pain worsens over the week end, please go to the emergency.

## 2013-11-16 NOTE — ED Provider Notes (Signed)
Medical screening examination/treatment/procedure(s) were performed by non-physician practitioner and as supervising physician I was immediately available for consultation/collaboration.   EKG Interpretation None        Kyrene Longan M Lemma Tetro, DO 11/16/13 1346 

## 2013-11-18 NOTE — Assessment & Plan Note (Signed)
Symptoms and clinical exam consistent with acute gout flare up. On chronic uric acid lowering therapy with allopurinol.  Plans: Continue colchicine, NSAID's, Percocet as was started in the ED. Recommended to follow up if symptoms persist or worsen or if he develops any systemic symptoms.

## 2013-11-18 NOTE — Assessment & Plan Note (Signed)
Elevated in the setting of acute gout flare up. Also patient stated he hasn't been taking Lisinopril.  Plans: Refill Lisinopril.

## 2013-11-18 NOTE — Progress Notes (Signed)
Subjective:   Patient ID: Daniel Perkins male   DOB: April 09, 1956 58 y.o.   MRN: [id number]  HPI: [name] is a 58 y.o. gentleman with PMH significant for HTN, CAD, DM-II comes to the office for ED follow up visit.  Patient was seen in the ED on 11/14/13 for left wrist pain and swelling, presumed to be secondary to acute gout flare up. Patient was given prescription for colchicine, percocet, naproxen and was recommended to follow up with the PCP. Patient reports starting all the medications as was recommended but still complaining of pain and swelling. Patient has been using a sling as it makes him comfortable to rest his left arm.   Patient denies any fever, chills, body pains or any systemic symptoms or signs. Patient reports that he has had several episodes of acute gout flare ups that are treated with colchicine. Patient reports compliance with allopurinol. Patient never underwent aspiration of any joint and his diagnosis of gout has been presumed to be gout as he had elevated levels of uric acid .  Patient denies any other complaints.  Past Medical History  Diagnosis Date  . Coronary artery disease     Holter monitor 02/2012 - sinus with rare PVC  . Gout   . Hyperlipidemia   . Hypertension   . Osteomyelitis   . CTEV (congenital talipes equinovarus)   . Myocardial infarction ~ 2007    "mild"  . Type II diabetes mellitus   . Club foot of both lower extremities 1958  . Pneumonia 2000's    "couple times"   Current Outpatient Prescriptions  Medication Sig Dispense Refill  . albuterol (PROVENTIL HFA;VENTOLIN HFA) 108 (90 BASE) MCG/ACT inhaler Inhale 1-2 puffs into the lungs every 6 (six) hours as needed for wheezing or shortness of breath.  1 Inhaler  0  . allopurinol (ZYLOPRIM) 100 MG tablet Take 1 tablet (100 mg total) by mouth daily.  30 tablet  2  . Blood Glucose Monitoring Suppl (ONE TOUCH ULTRA 2) W/DEVICE KIT Check blood sugar twice daily as instructed dx code  250.00  1 each  0  . colchicine 0.6 MG tablet Take one tablet now, then one hour later take one more tablet. Beginning the following day, take one table by mouth daily, for 5 days  7 tablet  0  . dextromethorphan-guaiFENesin (MUCINEX DM) 30-600 MG per 12 hr tablet Take 1 tablet by mouth 2 (two) times daily.  14 tablet  1  . glipiZIDE (GLUCOTROL) 5 MG tablet Take 1 tablet (5 mg total) by mouth daily before breakfast.  30 tablet  3  . glucose blood (ONE TOUCH ULTRA TEST) test strip Check blood sugar twice daily as instructed dx code 250.00  100 each  5  . Lancet Devices MISC 1 kit by Does not apply route 3 (three) times daily.  100 each  11  . lisinopril (PRINIVIL,ZESTRIL) 10 MG tablet Take 1 tablet (10 mg total) by mouth daily.  30 tablet  3  . metFORMIN (GLUCOPHAGE) 1000 MG tablet Take 1 tablet (1,000 mg total) by mouth 2 (two) times daily with a meal.  60 tablet  3  . methocarbamol (ROBAXIN) 500 MG tablet Take 2 tablets (1,000 mg total) by mouth every 8 (eight) hours as needed for muscle spasms.  60 tablet  0  . metoprolol tartrate (LOPRESSOR) 25 MG tablet Take 1 tablet (25 mg total) by mouth 2 (two) times daily.  60 tablet  0  . naproxen (NAPROSYN) 500  MG tablet Take 1 tablet (500 mg total) by mouth 2 (two) times daily as needed for mild pain or moderate pain (TAKE WITH MEALS.).  60 tablet  0  . ONETOUCH DELICA LANCETS FINE MISC Check blood sugar twice daily as instructed dx code 250.00  100 each  12  . oxyCODONE-acetaminophen (PERCOCET) 5-325 MG per tablet Take 1-2 tablets by mouth every 6 (six) hours as needed for severe pain.  6 tablet  0  . sildenafil (VIAGRA) 50 MG tablet Take 1 tablet (50 mg total) by mouth daily as needed for erectile dysfunction.  2 tablet  0   No current facility-administered medications for this visit.   Family History  Problem Relation Age of Onset  . Diabetes Mother   . Coronary artery disease Mother     HAS PACEMAKER  . Heart disease Mother   . Heart attack  Brother    History   Social History  . Marital Status: Legally Separated    Spouse Name: N/A    Number of Children: N/A  . Years of Education: 13   Occupational History  .    . unemployed    Social History Main Topics  . Smoking status: Former Smoker -- 1.00 packs/day for 32 years    Types: Cigarettes    Quit date: 07/06/2007  . Smokeless tobacco: Never Used  . Alcohol Use: Yes     Comment: "usually drink one small can beer/year; birthday or superbowl"  . Drug Use: No  . Sexual Activity: None   Other Topics Concern  . None   Social History Narrative  . None   Review of Systems: As per HPI.  Objective:  Physical Exam: Filed Vitals:   11/15/13 1517  BP: 168/98  Pulse: 87  Temp: 97.1 F (36.2 C)  TempSrc: Oral  Height: '5\' 8"'  (1.727 m)  Weight: 227 lb 4.8 oz (103.103 kg)  SpO2: 98%   Constitutional: Vital signs reviewed.  Patient is a well-developed and well-nourished and is in no acute distress and cooperative with exam.  Cardiovascular: RRR, S1 normal, S2 normal, no MRG Pulmonary/Chest: normal respiratory effort, CTAB, no wheezes, rales, or rhonchi Musculoskeletal: Left wrist is warm, swollen, painful. ROM severely restricted secondary to pain. Neurological: A&O x3  Assessment & Plan:

## 2013-11-18 NOTE — Progress Notes (Signed)
I saw patient and discussed his care with resident Dr. Comer LocketBoggala at the time of the visit.  We reviewed the resident's history and exam and pertinent patient test results.  I agree with the assessment, diagnosis, and plan of care documented in the resident's note.

## 2013-11-27 ENCOUNTER — Encounter: Payer: Medicare Other | Admitting: Internal Medicine

## 2014-01-06 ENCOUNTER — Other Ambulatory Visit: Payer: Self-pay | Admitting: *Deleted

## 2014-01-08 MED ORDER — COLCHICINE 0.6 MG PO TABS: ORAL_TABLET | ORAL | Status: DC

## 2014-01-10 ENCOUNTER — Encounter: Payer: Self-pay | Admitting: Internal Medicine

## 2014-01-10 ENCOUNTER — Ambulatory Visit (INDEPENDENT_AMBULATORY_CARE_PROVIDER_SITE_OTHER): Payer: Medicare Other | Admitting: Internal Medicine

## 2014-01-10 VITALS — BP 170/96 | HR 96 | Temp 98.2°F | Ht 68.0 in | Wt 227.8 lb

## 2014-01-10 DIAGNOSIS — M10042 Idiopathic gout, left hand: Secondary | ICD-10-CM

## 2014-01-10 DIAGNOSIS — M109 Gout, unspecified: Secondary | ICD-10-CM

## 2014-01-10 MED ORDER — COLCHICINE 0.6 MG PO TABS: ORAL_TABLET | ORAL | Status: DC

## 2014-01-10 MED ORDER — HYDROCODONE-ACETAMINOPHEN 5-325 MG PO TABS: 1.0000 | ORAL_TABLET | Freq: Three times a day (TID) | ORAL | Status: DC | PRN

## 2014-01-10 MED ORDER — OXYCODONE-ACETAMINOPHEN 5-325 MG PO TABS: 1.0000 | ORAL_TABLET | Freq: Three times a day (TID) | ORAL | Status: DC | PRN

## 2014-01-10 NOTE — Patient Instructions (Addendum)
We will be giving you a prescription for colchicine, the way you always take it. Please complete the dose. Take two tablets to start then take 1 tablet 1 hour later. Then stop.  Also we will increase the dose of your Alorpurinol when you come in a week for check up. We will not increase it now, till this flare up of your gout has resolved.  Gout Gout is an inflammatory arthritis caused by a buildup of uric acid crystals in the joints. Uric acid is a chemical that is normally present in the blood. When the level of uric acid in the blood is too high it can form crystals that deposit in your joints and tissues. This causes joint redness, soreness, and swelling (inflammation). Repeat attacks are common. Over time, uric acid crystals can form into masses (tophi) near a joint, destroying bone and causing disfigurement. Gout is treatable and often preventable. CAUSES  The disease begins with elevated levels of uric acid in the blood. Uric acid is produced by your body when it breaks down a naturally found substance called purines. Certain foods you eat, such as meats and fish, contain high amounts of purines. Causes of an elevated uric acid level include:  Being passed down from parent to child (heredity).  Diseases that cause increased uric acid production (such as obesity, psoriasis, and certain cancers).  Excessive alcohol use.  Diet, especially diets rich in meat and seafood.  Medicines, including certain cancer-fighting medicines (chemotherapy), water pills (diuretics), and aspirin.  Chronic kidney disease. The kidneys are no longer able to remove uric acid well.  Problems with metabolism. Conditions strongly associated with gout include:  Obesity.  High blood pressure.  High cholesterol.  Diabetes. Not everyone with elevated uric acid levels gets gout. It is not understood why some people get gout and others do not. Surgery, joint injury, and eating too much of certain foods are some  of the factors that can lead to gout attacks. SYMPTOMS   An attack of gout comes on quickly. It causes intense pain with redness, swelling, and warmth in a joint.  Fever can occur.  Often, only one joint is involved. Certain joints are more commonly involved:  Base of the big toe.  Knee.  Ankle.  Wrist.  Finger. Without treatment, an attack usually goes away in a few days to weeks. Between attacks, you usually will not have symptoms, which is different from many other forms of arthritis. DIAGNOSIS  Your caregiver will suspect gout based on your symptoms and exam. In some cases, tests may be recommended. The tests may include:  Blood tests.  Urine tests.  X-rays.  Joint fluid exam. This exam requires a needle to remove fluid from the joint (arthrocentesis). Using a microscope, gout is confirmed when uric acid crystals are seen in the joint fluid. TREATMENT  There are two phases to gout treatment: treating the sudden onset (acute) attack and preventing attacks (prophylaxis).  Treatment of an Acute Attack.  Medicines are used. These include anti-inflammatory medicines or steroid medicines.  An injection of steroid medicine into the affected joint is sometimes necessary.  The painful joint is rested. Movement can worsen the arthritis.  You may use warm or cold treatments on painful joints, depending which works best for you.  Treatment to Prevent Attacks.  If you suffer from frequent gout attacks, your caregiver may advise preventive medicine. These medicines are started after the acute attack subsides. These medicines either help your kidneys eliminate uric acid from  your body or decrease your uric acid production. You may need to stay on these medicines for a very long time.  The early phase of treatment with preventive medicine can be associated with an increase in acute gout attacks. For this reason, during the first few months of treatment, your caregiver may also  advise you to take medicines usually used for acute gout treatment. Be sure you understand your caregiver's directions. Your caregiver may make several adjustments to your medicine dose before these medicines are effective.  Discuss dietary treatment with your caregiver or dietitian. Alcohol and drinks high in sugar and fructose and foods such as meat, poultry, and seafood can increase uric acid levels. Your caregiver or dietitian can advise you on drinks and foods that should be limited. HOME CARE INSTRUCTIONS   Do not take aspirin to relieve pain. This raises uric acid levels.  Only take over-the-counter or prescription medicines for pain, discomfort, or fever as directed by your caregiver.  Rest the joint as much as possible. When in bed, keep sheets and blankets off painful areas.  Keep the affected joint raised (elevated).  Apply warm or cold treatments to painful joints. Use of warm or cold treatments depends on which works best for you.  Use crutches if the painful joint is in your leg.  Drink enough fluids to keep your urine clear or pale yellow. This helps your body get rid of uric acid. Limit alcohol, sugary drinks, and fructose drinks.  Follow your dietary instructions. Pay careful attention to the amount of protein you eat. Your daily diet should emphasize fruits, vegetables, whole grains, and fat-free or low-fat milk products. Discuss the use of coffee, vitamin C, and cherries with your caregiver or dietitian. These may be helpful in lowering uric acid levels.  Maintain a healthy body weight. SEEK MEDICAL CARE IF:   You develop diarrhea, vomiting, or any side effects from medicines.  You do not feel better in 24 hours, or you are getting worse. SEEK IMMEDIATE MEDICAL CARE IF:   Your joint becomes suddenly more tender, and you have chills or a fever. MAKE SURE YOU:   Understand these instructions.  Will watch your condition.  Will get help right away if you are not  doing well or get worse.

## 2014-01-10 NOTE — Progress Notes (Signed)
Patient ID: Daniel Perkins, male   DOB: August 20, 1955, 58 y.o.   MRN: [id number]   Subjective:   Patient ID: Daniel Perkins male   DOB: 19-Jun-1955 58 y.o.   MRN: [id number]  HPI: Daniel Perkins is a 58 y.o. with PMH of HTN, CAD, HLD, Gout, pt came in today with complaints of Left wrist pain over the past week that has spread to his elbow. Pt says he has had at least 3 episodes over the past year (Though one other episode documented in Epic this year). Pt has been on Allopurinol 1758m daily which he claims he has been complaint with. Pt is typical of his normal Gout flares. He say she has had knee aspirations in the past that confirmed Gout- by Daniel Perkins orthopedist. Pt has not tried anything for his Gout. No other joint involvement, no redness of affected joint, No fever, vomiting or malaise.  Past Medical History  Diagnosis Date  . Coronary artery disease     Holter monitor 02/2012 - sinus with rare PVC  . Gout   . Hyperlipidemia   . Hypertension   . Osteomyelitis   . CTEV (congenital talipes equinovarus)   . Myocardial infarction ~ 2007    "mild"  . Type II diabetes mellitus   . Club foot of both lower extremities 1958  . Pneumonia 2000's    "couple times"   Current Outpatient Prescriptions  Medication Sig Dispense Refill  . albuterol (PROVENTIL HFA;VENTOLIN HFA) 108 (90 BASE) MCG/ACT inhaler Inhale 1-2 puffs into the lungs every 6 (six) hours as needed for wheezing or shortness of breath.  1 Inhaler  0  . allopurinol (ZYLOPRIM) 100 MG tablet Take 1 tablet (100 mg total) by mouth daily.  30 tablet  2  . Blood Glucose Monitoring Suppl (ONE TOUCH ULTRA 2) W/DEVICE KIT Check blood sugar twice daily as instructed dx code 250.00  1 each  0  . colchicine 0.6 MG tablet Take 1.2958mto start. Then 0.58m104m hour later.  5 tablet  0  . dextromethorphan-guaiFENesin (MUCINEX DM) 30-600 MG per 12 hr tablet Take 1 tablet by mouth 2 (two) times daily.  14 tablet  1  . glipiZIDE (GLUCOTROL) 5  MG tablet Take 1 tablet (5 mg total) by mouth daily before breakfast.  30 tablet  3  . glucose blood (ONE TOUCH ULTRA TEST) test strip Check blood sugar twice daily as instructed dx code 250.00  100 each  5  . HYDROcodone-acetaminophen (NORCO) 5-325 MG per tablet Take 1-2 tablets by mouth every 8 (eight) hours as needed for moderate pain.  6 tablet  0  . Lancet Devices MISC 1 kit by Does not apply route 3 (three) times daily.  100 each  11  . lisinopril (PRINIVIL,ZESTRIL) 10 MG tablet Take 1 tablet (10 mg total) by mouth daily.  30 tablet  3  . metFORMIN (GLUCOPHAGE) 1000 MG tablet Take 1 tablet (1,000 mg total) by mouth 2 (two) times daily with a meal.  60 tablet  3  . methocarbamol (ROBAXIN) 500 MG tablet Take 2 tablets (1,000 mg total) by mouth every 8 (eight) hours as needed for muscle spasms.  60 tablet  0  . metoprolol tartrate (LOPRESSOR) 25 MG tablet Take 1 tablet (25 mg total) by mouth 2 (two) times daily.  60 tablet  0  . naproxen (NAPROSYN) 500 MG tablet Take 1 tablet (500 mg total) by mouth 2 (two) times daily as needed for mild pain or moderate pain (  TAKE WITH MEALS.).  60 tablet  0  . ONETOUCH DELICA LANCETS FINE MISC Check blood sugar twice daily as instructed dx code 250.00  100 each  12  . sildenafil (VIAGRA) 50 MG tablet Take 1 tablet (50 mg total) by mouth daily as needed for erectile dysfunction.  2 tablet  0   No current facility-administered medications for this visit.   Family History  Problem Relation Age of Onset  . Diabetes Mother   . Coronary artery disease Mother     HAS PACEMAKER  . Heart disease Mother   . Heart attack Brother    History   Social History  . Marital Status: Legally Separated    Spouse Name: N/A    Number of Children: N/A  . Years of Education: 13   Occupational History  .    . unemployed    Social History Main Topics  . Smoking status: Former Smoker -- 1.00 packs/day for 32 years    Types: Cigarettes    Quit date: 07/06/2007  .  Smokeless tobacco: Never Used  . Alcohol Use: Yes     Comment: "usually drink one small can beer/year; birthday or superbowl"  . Drug Use: No  . Sexual Activity: None   Other Topics Concern  . None   Social History Narrative  . None   Review of Systems: CONSTITUTIONAL- No Fever, weightloss, night sweat or change in appetite. SKIN- No Rash, colour changes or itching. HEAD- No Headache or dizziness. EYES- No Vision change. EARS- No vertigo, hearing loss or ear discharge. Mouth/throat- No Sorethroat, dentures, or bleeding gums. RESPIRATORY- No Cough or SOB. CARDIAC- No Palpitations, DOE, PND or chest pain. GI- No nausea, vomiting, diarrhoea, constipation, abd pain. URINARY- No Frequency, urgency, straining or dysuria. NEUROLOGIC- No Numbness, syncope, seizures or burning. [facility]- Denies depression or anxiety.  Objective:  Physical Exam: Filed Vitals:   01/10/14 1515  BP: 170/96  Pulse: 96  Temp: 98.2 F (36.8 C)  TempSrc: Oral  Height: '5\' 8"'  (1.727 m)  Weight: 227 lb 12.8 oz (103.329 kg)  SpO2: 99%   GENERAL- alert, co-operative, appears as stated age, not in any distress. HEENT- Atraumatic, normocephalic, PERRL, EOMI, oral mucosa appears moist,  neck supple. CARDIAC- RRR, no murmurs, rubs or gallops. RESP- Moving equal volumes of air, and clear to auscultation bilaterally, no wheezes or crackles. ABDOMEN- Soft, nontender, no palpable masses or organomegaly, bowel sounds present. NEURO- No obvious Cr N abnormality, strenght upper and lower extremities- intact, Gait- Normal. EXTREMITIES- pulse 2+, symmetric, no pedal edema. Left upper extremity appears swollen, beginning at the wrist and extending proximally to the elbow, also with tenderness, pt holding his hand up with minimal movement. No erythema. Range of motion limited by pain. RUE appears normal.  SKIN- Warm, dry, No rash or lesion.  PSYCH- Normal mood and affect, appropriate thought content and speech.  Assessment  & Plan:  The patient's case and plan of care was discussed with attending physician, Daniel. Lynnae January.  Please see problem based charting for assessment and plan.

## 2014-01-12 NOTE — Assessment & Plan Note (Addendum)
Pt presentation appears very similar to his previous Gout flares. Compliant with allorpurinol-  daily. No features suggestive of sepsis or systemic involvement or SIRs.  Plan- Colchicine- 1.2mg  start and then 0.6mg  1hour after then stop. - Pt asking for Norco for pain as this has helped in the past, pt says she cannot sleep otherwise. Norco- 5-325mg  Q8H  #6 tablets. - Plan to increase Allopurinol- to  Daily, when acute flare resolves, considering pt is still having gout attacks on current regimen. - See in a week.

## 2014-01-14 NOTE — Progress Notes (Signed)
Case discussed with Dr. Emokpae soon after the resident saw the patient. We reviewed the resident's history and exam and pertinent patient test results. I agree with the assessment, diagnosis, and plan of care documented in the resident's note. 

## 2014-01-17 ENCOUNTER — Ambulatory Visit: Payer: Medicare Other | Admitting: Internal Medicine

## 2014-01-29 ENCOUNTER — Other Ambulatory Visit: Payer: Self-pay | Admitting: *Deleted

## 2014-01-29 DIAGNOSIS — M109 Gout, unspecified: Secondary | ICD-10-CM

## 2014-01-29 MED ORDER — ALLOPURINOL 100 MG PO TABS: 100.0000 mg | ORAL_TABLET | Freq: Every day | ORAL | Status: DC

## 2014-01-29 MED ORDER — COLCHICINE 0.6 MG PO TABS: ORAL_TABLET | ORAL | Status: DC

## 2014-02-04 ENCOUNTER — Encounter: Payer: Self-pay | Admitting: *Deleted

## 2014-02-10 ENCOUNTER — Encounter: Payer: Self-pay | Admitting: Internal Medicine

## 2014-02-10 ENCOUNTER — Ambulatory Visit (INDEPENDENT_AMBULATORY_CARE_PROVIDER_SITE_OTHER): Payer: Medicare Other | Admitting: Dietician

## 2014-02-10 ENCOUNTER — Telehealth: Payer: Self-pay | Admitting: Dietician

## 2014-02-10 ENCOUNTER — Encounter: Payer: Self-pay | Admitting: Dietician

## 2014-02-10 ENCOUNTER — Ambulatory Visit (INDEPENDENT_AMBULATORY_CARE_PROVIDER_SITE_OTHER): Payer: Medicare Other | Admitting: Internal Medicine

## 2014-02-10 ENCOUNTER — Other Ambulatory Visit: Payer: Self-pay | Admitting: Internal Medicine

## 2014-02-10 VITALS — BP 169/76 | HR 86 | Temp 98.2°F | Ht 68.0 in | Wt 230.3 lb

## 2014-02-10 DIAGNOSIS — M109 Gout, unspecified: Secondary | ICD-10-CM

## 2014-02-10 DIAGNOSIS — Z0000 Encounter for general adult medical examination without abnormal findings: Secondary | ICD-10-CM

## 2014-02-10 DIAGNOSIS — M10432 Other secondary gout, left wrist: Secondary | ICD-10-CM

## 2014-02-10 DIAGNOSIS — E66812 Obesity, class 2: Secondary | ICD-10-CM

## 2014-02-10 DIAGNOSIS — E669 Obesity, unspecified: Secondary | ICD-10-CM

## 2014-02-10 DIAGNOSIS — B351 Tinea unguium: Secondary | ICD-10-CM

## 2014-02-10 DIAGNOSIS — I10 Essential (primary) hypertension: Secondary | ICD-10-CM

## 2014-02-10 DIAGNOSIS — R197 Diarrhea, unspecified: Secondary | ICD-10-CM | POA: Insufficient documentation

## 2014-02-10 DIAGNOSIS — M10042 Idiopathic gout, left hand: Secondary | ICD-10-CM

## 2014-02-10 DIAGNOSIS — I739 Peripheral vascular disease, unspecified: Secondary | ICD-10-CM

## 2014-02-10 DIAGNOSIS — E119 Type 2 diabetes mellitus without complications: Secondary | ICD-10-CM

## 2014-02-10 LAB — POCT GLYCOSYLATED HEMOGLOBIN (HGB A1C): HEMOGLOBIN A1C: 6.8

## 2014-02-10 LAB — GLUCOSE, CAPILLARY: Glucose-Capillary: 166 mg/dL — ABNORMAL HIGH (ref 70–99)

## 2014-02-10 MED ORDER — NAPROXEN 500 MG PO TABS: 500.0000 mg | ORAL_TABLET | Freq: Two times a day (BID) | ORAL | Status: DC | PRN

## 2014-02-10 MED ORDER — GLUCOSE BLOOD VI STRP: ORAL_STRIP | Status: DC

## 2014-02-10 MED ORDER — LISINOPRIL 10 MG PO TABS: 10.0000 mg | ORAL_TABLET | Freq: Every day | ORAL | Status: DC

## 2014-02-10 MED ORDER — ALLOPURINOL 100 MG PO TABS: 100.0000 mg | ORAL_TABLET | Freq: Every day | ORAL | Status: DC

## 2014-02-10 MED ORDER — GLIPIZIDE ER 5 MG PO TB24: 5.0000 mg | ORAL_TABLET | Freq: Every day | ORAL | Status: DC

## 2014-02-10 MED ORDER — TERBINAFINE HCL 250 MG PO TABS: 250.0000 mg | ORAL_TABLET | Freq: Every day | ORAL | Status: DC

## 2014-02-10 NOTE — Progress Notes (Signed)
Patient ID: [name], male   DOB: 02/15/1956, 58 y.o.   MRN: [id number]   Subjective:   HPI: [name] is a 58 y.o. man with PMH of HTN, CAD, HLD, Gout presents with diarrhea for one month and darkening of his right big toe.   Darkening of right 1st toe: Patient reports that he has noticed that his first right toe, especially the nail, has been increasingly becoming black for over a month. Initially, it seemed like the nail was almost falling off, but has remained on. He denies fevers, chills, wounds, or pain. No preceding trauma. Patient has diabetes, but has no prior history of established PVD. However, he reports symptoms of cramps in his calf muscles with walking, which resolves at rest. He has never had ABIs.   Deformed fingernails: He is also concerned about increasing deformity of his fingernails on the left hand for several months.  Diarrhea: Patient reports one month history of diarrhea, or several episodes a day, especially after eating. The stool is watery without blood or melena. No abdominal pain. No constitutional symptoms. No nausea no vomiting. His appetite has been normal. He has no weight loss.  Diabetes: Well controlled with A1c of 6.8 today. On metformin 1000 mg daily. Supposed to be on glipizide 2.5 mg daily however, he says it is not taking this medication due to medication not being at his pharmacy. He also reports that he takes plenty of regular sodas (2L/day). He has discussed with Butch Penny today regarding weight loss, and appropriate diet. He is determined to lose some weight and increase exercise  ROS: Constitutional: Denies fever, chills, diaphoresis, appetite change and fatigue.  Respiratory: Denies SOB, DOE, cough, chest tightness, and wheezing. Denies chest pain. CVS: No chest pain, palpitations and leg swelling.  GU: No dysuria, frequency, hematuria, or flank pain.  MSK: No myalgias, back pain, joint swelling, arthralgias  Psych: No depression  symptoms. No SI or SA.   Past Medical History  Diagnosis Date  . Coronary artery disease     Holter monitor 02/2012 - sinus with rare PVC  . Gout   . Hyperlipidemia   . Hypertension   . Osteomyelitis   . CTEV (congenital talipes equinovarus)   . Myocardial infarction ~ 2007    "mild"  . Type II diabetes mellitus   . Club foot of both lower extremities 1958  . Pneumonia 2000's    "couple times"   Current Outpatient Prescriptions  Medication Sig Dispense Refill  . albuterol (PROVENTIL HFA;VENTOLIN HFA) 108 (90 BASE) MCG/ACT inhaler Inhale 1-2 puffs into the lungs every 6 (six) hours as needed for wheezing or shortness of breath.  1 Inhaler  0  . allopurinol (ZYLOPRIM) 100 MG tablet Take 1 tablet (100 mg total) by mouth daily.  30 tablet  2  . Blood Glucose Monitoring Suppl (ONE TOUCH ULTRA 2) W/DEVICE KIT Check blood sugar twice daily as instructed dx code 250.00  1 each  0  . glipiZIDE (GLUCOTROL XL) 5 MG 24 hr tablet Take 1 tablet (5 mg total) by mouth daily with breakfast.  30 tablet  1  . glucose blood (ONE TOUCH ULTRA TEST) test strip Check blood sugar twice daily as instructed dx code 250.00  100 each  5  . Lancet Devices MISC 1 kit by Does not apply route 3 (three) times daily.  100 each  11  . lisinopril (PRINIVIL,ZESTRIL) 10 MG tablet Take 1 tablet (10 mg total) by mouth daily.  30 tablet  3  .  metoprolol tartrate (LOPRESSOR) 25 MG tablet Take 1 tablet (25 mg total) by mouth 2 (two) times daily.  60 tablet  0  . naproxen (NAPROSYN) 500 MG tablet Take 1 tablet (500 mg total) by mouth 2 (two) times daily as needed for mild pain or moderate pain (TAKE WITH MEALS.).  60 tablet  0  . ONETOUCH DELICA LANCETS FINE MISC Check blood sugar twice daily as instructed dx code 250.00  100 each  12  . sildenafil (VIAGRA) 50 MG tablet Take 1 tablet (50 mg total) by mouth daily as needed for erectile dysfunction.  2 tablet  0  . terbinafine (LAMISIL) 250 MG tablet Take 1 tablet (250 mg total) by  mouth daily.  42 tablet  0   No current facility-administered medications for this visit.   Family History  Problem Relation Age of Onset  . Diabetes Mother   . Coronary artery disease Mother     HAS PACEMAKER  . Heart disease Mother   . Heart attack Brother    History   Social History  . Marital Status: Legally Separated    Spouse Name: N/A    Number of Children: N/A  . Years of Education: 13   Occupational History  .    . unemployed    Social History Main Topics  . Smoking status: Former Smoker -- 1.00 packs/day for 32 years    Types: Cigarettes    Quit date: 07/06/2007  . Smokeless tobacco: Never Used  . Alcohol Use: Yes     Comment: "usually drink one small can beer/year; birthday or superbowl"  . Drug Use: No  . Sexual Activity: None   Other Topics Concern  . None   Social History Narrative  . None    Objective:  Physical Exam: Filed Vitals:   02/10/14 0929  BP: 169/76  Pulse: 86  Temp: 98.2 F (36.8 C)  TempSrc: Oral  Height: '5\' 8"'  (1.727 m)  Weight: 230 lb 4.8 oz (104.463 kg)  SpO2: 99%   General: obese. No acute distress.  HEENT: Normal oral mucosa. MMM.  Lungs: CTA bilaterally. Heart: RRR; no extra sounds or murmurs  Abdomen: Non-distended, normal BS, soft, nontender; no hepatosplenomegaly  Extremities: postsurgical deformation of the feet bilaterally. right first toe appears dark. It's difficult to appreciate peripheral pulses (DP and and Posterior popliteal) but seems to have good capillary refill. The overlying nail is grossly deformed and much of the darkness is limited to the nail bed. Interdigital spaces, without wounds, or skin breakdowns. Sensation is intact. Left fingernails are also deformed, with appearance consistent with a nail fungal infection. Neurologic: Normal EOM,  Alert and oriented x3. No obvious neurologic/cranial nerve deficits.  Assessment & Plan:  I have discussed my assessment and plan  with  my attending in the clinic,  Dr. Daryll Drown  as detailed under problem based charting.

## 2014-02-10 NOTE — Assessment & Plan Note (Signed)
Assessment: Most likely diagnosis for his darkening of the first right toe is concerning for PVD. However, stability of nail fungal infection cannot be excluded. Currently does not seem to be limb threatening.  Plan: 1. Labs/imaging: referral for ABIs 2. Therapy: referral to podiatry. Restarted his ACE inhibitor 3. Follow up: with results from ABI.

## 2014-02-10 NOTE — Assessment & Plan Note (Signed)
Referred for a colonoscopy

## 2014-02-10 NOTE — Telephone Encounter (Signed)
Pharmacy confirmed patient received flu shot there on 01/29/14 and agreed to fax us report.

## 2014-02-10 NOTE — Progress Notes (Signed)
  Medical Nutrition Therapy:  Appt start time: 0830 end time:  0915. Assessment:  Primary concerns today: concerned about "big belly"- weight.  Patient says his belly is uncomfortable, bothers him when he walks, gets out of breathe, makes it hard to reach his feet to tie his shoes and he knows it isn't good for his health. He did not know the regular soda is not good for his blood sugars or blood pressure. Reports he started with loose uncontrollable stools about 3 weeks ago, takes metformin with food. Out of test strips, never got glipizide. He thinks his weight is caused by his night snacking or his regular soda intake. He wants to work on cutting back on regular soda. Lives with is father who buys the soda.  Preferred Learning Style: Auditory, Visual Learning Readiness: Ready  MEDICATIONS: noted as above  blood sugars: has been unable to self monitor due to no meter, then no strips, A1C:6.8- decreased without glipizide DIETARY INTAKE: Usual eating pattern includes 2-3 meals and 1 snacks per day. Everyday foods include regular soda, water, vegetables, meat, bread.  Avoided foods include ice cream, milk.   24-hr recall:  B ( AM): hash browns, sweet tea from [employer]- often skips except may drink regular soda L ( PM): often skips- may drink regular soda- when at girlfriend's house has water, and something for lunch like a sandwich Snk ( PM): soda D ( PM): vegetables, soda, meat Snk ( PM): regular soda Beverages: sweet tea, regular soda 1-2 liters a day, water, reports lactose intolerance Usual physical activity: not addressed today BMI-35 at 230# Estimated energy needs for weight loss: ~1800-2200 calories ~220-230 g carbohydrates ~90-110 g protein ~ 70-80 g fat  Progress Towards Goal(s):  In progress.   Nutritional Diagnosis:  NB-1.1 Food and nutrition-related knowledge deficit As related to lack of sufficient nutrition and meal planning informaiion to acheive stated goals.  As  evidenced by his report and questions.    Intervention:  Nutrition education about weight loss, healthy food choices, caloric content of foods, goal setting, pre and probiotics. Coordination of care: discussed with physician and nurse today- flu shot, refills, loose stools and diabetes medicine Teaching Method Utilized: Visual and Auditory Handouts given during visit include:  AVS  Chart to record fluid intake for next week Barriers to learning/adherence to lifestyle change: will monitor, ability to purchase foods, support Demonstrated degree of understanding via:  Teach Back   Monitoring/Evaluation:  Dietary intake, exercise, meter, and body weight in 1 week(s).

## 2014-02-10 NOTE — Assessment & Plan Note (Signed)
Physical examination findings of his fingernails, is most consistent with nail fungal infection.  Plan -Start Terbenafine 250 mg daily for 6 weeks. -Will obtain a baseline liver panel.

## 2014-02-10 NOTE — Assessment & Plan Note (Signed)
Lab Results  Component Value Date   HGBA1C 6.8 02/10/2014   HGBA1C 7.7 10/02/2013   HGBA1C 7.7 07/10/2013     Assessment: Diabetes control: good control (HgbA1C at goal) Progress toward A1C goal:  at goal Comments: Does not take glipizide, which was called in.  Plan: Medications:  Stop metformin due to diarrhea. Gave him prescriptions of glipizide ER 5 mg daily. Home glucose monitoring: Frequency: once a day Timing: before breakfast Instruction/counseling given: reminded to bring medications to each visit, discussed foot care, discussed the need for weight loss and discussed diet Educational resources provided: brochure Self management tools provided:   Other plans: Will discontinue metformin as is the most likely culprit for his diarrhea. Gave him physical prescriptions of glipizide to avoid confusion between here and pharmacy. Encouraged him to pick his medication today.

## 2014-02-10 NOTE — Assessment & Plan Note (Signed)
Discussed weight loss with Daniel Perkins. Patient has reportedly been taking a lot of regular sodas up to 2 L per day. He understands that this will increase his weight. He is motivated to stop taking sodas, and, instead concentrate on taking more water.

## 2014-02-10 NOTE — Patient Instructions (Signed)
Thank you for bringing your medicine today.  See you next week:   Hope the chart to keep track of your drinks works for you- if so please being with you to your appointment next week.  Feel free to call with questions!  Lupita LeashDonna [phone]

## 2014-02-10 NOTE — Patient Instructions (Addendum)
General Instructions: Please stop taking metformin as it may be causing diarrhea Please take Terbinafine for fungal infection of the nails. It will last 12 weeks of treatment  We will refer you to check circulation of blood in your feet  Please take your medication  Please follow up in 1-2 weeks   Treatment Goals:  Goals (1 Years of Data) as of 02/10/14         As of Today 01/10/14 11/15/13 11/14/13 11/14/13     Blood Pressure    . Blood Pressure < 140/90  169/76 170/96 168/98 191/100 229/137    . Blood Pressure < 140/90  169/76 170/96 168/98 191/100 229/137     Result Component    . HEMOGLOBIN A1C < 7.0  6.8        . LDL CALC < 100          . LDL CALC < 130           Weight    . Weight < 200 lb (90.719 kg)  230 lb 4.8 oz (104.463 kg) 227 lb 12.8 oz (103.329 kg) 227 lb 4.8 oz (103.103 kg)        Progress Toward Treatment Goals:  Treatment Goal 02/10/2014  Hemoglobin A1C at goal  Blood pressure unchanged    Self Care Goals & Plans:  Self Care Goal 02/10/2014  Manage my medications take my medicines as prescribed; bring my medications to every visit; refill my medications on time  Monitor my health -  Eat healthy foods drink diet soda or water instead of juice or soda; eat more vegetables; eat foods that are low in salt; eat baked foods instead of fried foods; eat fruit for snacks and desserts  Be physically active -  Meeting treatment goals -    Home Blood Glucose Monitoring 02/10/2014  Check my blood sugar once a day  When to check my blood sugar before breakfast     Care Management & Community Referrals:  Referral 02/10/2014  Referrals made for care management support diabetes educator  Referrals made to community resources -

## 2014-02-10 NOTE — Assessment & Plan Note (Signed)
Most likely etiology of his diarrhea, and his metformin. Will discontinue this and start him on glipizide 5 mg daily. If his diarrhea does not improve after discontinuation of metformin, we can consider other etiologies.

## 2014-02-10 NOTE — Assessment & Plan Note (Signed)
Symptoms are stable. Refill allopurinol. Naproxen as needed for pain

## 2014-02-10 NOTE — Assessment & Plan Note (Signed)
BP Readings from Last 3 Encounters:  02/10/14 169/76  01/10/14 170/96  11/15/13 168/98    Lab Results  Component Value Date   NA 138 10/23/2013   K 4.7 10/23/2013   CREATININE 1.05 10/23/2013    Assessment: Blood pressure control: moderately elevated Progress toward BP goal:  unchanged Comments: had been taking metoprolol but not the Lisinopril   Plan: Medications:  Continue with the metoprolol 25 mg twice daily. Provided him with physical prescription of lisinopril 10 mg daily. Educational resources provided: brochure Self management tools provided:   Other plans: follow up in one to 2 weeks. Initially, patient was on hydrochlorothiazide, which was held after an episode of dehydration with hypotension that required hospitalization in the summer. His blood pressure is now elevated. Restarted Lisinopril and we can consider restarting his hydrochlorothiazide as well if it does not improve.

## 2014-02-11 LAB — HEPATIC FUNCTION PANEL
ALT: 29 U/L (ref 0–53)
AST: 31 U/L (ref 0–37)
Albumin: 3.9 g/dL (ref 3.5–5.2)
Alkaline Phosphatase: 91 U/L (ref 39–117)
BILIRUBIN DIRECT: 0.1 mg/dL (ref 0.0–0.3)
BILIRUBIN INDIRECT: 0.2 mg/dL (ref 0.2–1.2)
BILIRUBIN TOTAL: 0.3 mg/dL (ref 0.2–1.2)
Total Protein: 7.6 g/dL (ref 6.0–8.3)

## 2014-02-11 NOTE — Progress Notes (Signed)
Internal Medicine Clinic Attending  I saw and evaluated the patient.  I personally confirmed the key portions of the history and exam documented by Dr. Kazibwe and I reviewed pertinent patient test results.  The assessment, diagnosis, and plan were formulated together and I agree with the documentation in the resident's note. 

## 2014-02-13 ENCOUNTER — Encounter: Payer: Self-pay | Admitting: *Deleted

## 2014-02-17 ENCOUNTER — Ambulatory Visit: Payer: Medicare Other | Admitting: Internal Medicine

## 2014-02-17 ENCOUNTER — Ambulatory Visit: Payer: Medicare Other | Admitting: Dietician

## 2014-02-17 ENCOUNTER — Encounter: Payer: Self-pay | Admitting: Internal Medicine

## 2014-03-17 NOTE — Addendum Note (Signed)
Addended by: Neomia DearPOWERS, Colonel Krauser E on: 03/17/2014 02:55 PM   Modules accepted: Orders

## 2014-04-30 NOTE — Addendum Note (Signed)
Addended by: Neomia DearPOWERS, Jonasia Coiner E on: 04/30/2014 05:44 PM   Modules accepted: Orders

## 2014-06-15 ENCOUNTER — Encounter (HOSPITAL_COMMUNITY): Payer: Self-pay | Admitting: Emergency Medicine

## 2014-06-15 ENCOUNTER — Emergency Department (HOSPITAL_COMMUNITY)
Admission: EM | Admit: 2014-06-15 | Discharge: 2014-06-15 | Disposition: A | Discharge status: Home / Self Care | Payer: Medicare Other | Attending: Emergency Medicine | Admitting: Emergency Medicine

## 2014-06-15 DIAGNOSIS — M25561 Pain in right knee: Secondary | ICD-10-CM | POA: Insufficient documentation

## 2014-06-15 DIAGNOSIS — Z87891 Personal history of nicotine dependence: Secondary | ICD-10-CM | POA: Insufficient documentation

## 2014-06-15 DIAGNOSIS — Z9889 Other specified postprocedural states: Secondary | ICD-10-CM | POA: Insufficient documentation

## 2014-06-15 DIAGNOSIS — I2510 Atherosclerotic heart disease of native coronary artery without angina pectoris: Secondary | ICD-10-CM | POA: Diagnosis not present

## 2014-06-15 DIAGNOSIS — I10 Essential (primary) hypertension: Secondary | ICD-10-CM | POA: Diagnosis not present

## 2014-06-15 DIAGNOSIS — Z8776 Personal history of (corrected) congenital malformations of integument, limbs and musculoskeletal system: Secondary | ICD-10-CM | POA: Insufficient documentation

## 2014-06-15 DIAGNOSIS — Z8701 Personal history of pneumonia (recurrent): Secondary | ICD-10-CM | POA: Insufficient documentation

## 2014-06-15 DIAGNOSIS — M109 Gout, unspecified: Secondary | ICD-10-CM | POA: Diagnosis not present

## 2014-06-15 DIAGNOSIS — I252 Old myocardial infarction: Secondary | ICD-10-CM | POA: Diagnosis not present

## 2014-06-15 DIAGNOSIS — Z79899 Other long term (current) drug therapy: Secondary | ICD-10-CM | POA: Insufficient documentation

## 2014-06-15 DIAGNOSIS — E119 Type 2 diabetes mellitus without complications: Secondary | ICD-10-CM | POA: Insufficient documentation

## 2014-06-15 MED ORDER — HYDROCODONE-ACETAMINOPHEN 5-325 MG PO TABS: 1.0000 | ORAL_TABLET | ORAL | Status: DC | PRN

## 2014-06-15 MED ORDER — NAPROXEN 500 MG PO TABS
500.0000 mg | ORAL_TABLET | Freq: Once | ORAL | Status: AC
  Administered 2014-06-15: 500 mg via ORAL
  Filled 2014-06-15: qty 1

## 2014-06-15 MED ORDER — NAPROXEN 500 MG PO TABS: 500.0000 mg | ORAL_TABLET | Freq: Two times a day (BID) | ORAL | Status: DC

## 2014-06-15 NOTE — Discharge Instructions (Signed)
Please follow the directions provided. Be sure to follow-up with orthopedic doctor if your knee pain does not improve or worsens. Please take the naproxen twice a day to help with pain. You may use the Vicodin for pain not relieved by the naproxen. Where your knee sleeve for comfort. Rest your knee when possible and you may use ice 20 minutes on 20 minutes off to help with pain. Don't hesitate to return for any new, worsening, or concerning symptoms.   SEEK IMMEDIATE MEDICAL CARE IF:  Your  toes are numb or blue.  The pain is not responding to medications and continues to stay the same or get worse.  The pain in your joint becomes severe.  You develop a fever over 102 F (38.9 C).  It becomes impossible to move or use the joint.

## 2014-06-15 NOTE — ED Provider Notes (Signed)
CSN: [id number]     Arrival date & time 06/15/14  1100 History   First MD Initiated Contact with Patient 06/15/14 1127     Chief Complaint  Patient presents with  . Knee Pain    (Consider location/radiation/quality/duration/timing/severity/associated sxs/prior Treatment) HPI Daniel Perkins is a 59 year old male presenting with right knee pain. He states he first noticed this knee pain actually 1 month ago. He denies any history of an injury but states he notices pain intermittently with weightbearing and ambulation. He describes the pain as sharp and rates as 6/10 at rest and 8/10 while walking. He denies any warmth, redness, or swelling to the knee, or fevers.  Past Medical History  Diagnosis Date  . Coronary artery disease     Holter monitor 02/2012 - sinus with rare PVC  . Gout   . Hyperlipidemia   . Hypertension   . Osteomyelitis   . CTEV (congenital talipes equinovarus)   . Myocardial infarction ~ 2007    "mild"  . Type II diabetes mellitus   . Club foot of both lower extremities 1958  . Pneumonia 2000's    "couple times"   Past Surgical History  Procedure Laterality Date  . Anterior cervical decomp/discectomy fusion  X 2  . Foot surgery Left x16    "joints collapsed; keep getting infected"  . Posterior cervical fusion/foraminotomy  X 2  . Foot fracture surgery Right 1990's    "pt a steel plate in"  . Cardiac catheterization     Family History  Problem Relation Age of Onset  . Diabetes Mother   . Coronary artery disease Mother     HAS PACEMAKER  . Heart disease Mother   . Heart attack Brother    History  Substance Use Topics  . Smoking status: Former Smoker -- 1.00 packs/day for 32 years    Types: Cigarettes    Quit date: 07/06/2007  . Smokeless tobacco: Never Used  . Alcohol Use: Yes     Comment: "usually drink one small can beer/year; birthday or superbowl"    Review of Systems  Constitutional: Negative for fever and chills.  Musculoskeletal:  Positive for arthralgias. Negative for joint swelling.  Skin: Negative for rash.  Neurological: Negative for weakness and numbness.    Allergies  Morphine and Pravastatin sodium  Home Medications   Prior to Admission medications   Medication Sig Start Date End Date Taking? Authorizing Provider  albuterol (PROVENTIL HFA;VENTOLIN HFA) 108 (90 BASE) MCG/ACT inhaler Inhale 1-2 puffs into the lungs every 6 (six) hours as needed for wheezing or shortness of breath. 08/22/13   Fredia Sorrow, MD  allopurinol (ZYLOPRIM) 100 MG tablet Take 1 tablet (100 mg total) by mouth daily. 02/10/14   Jessee Avers, MD  Blood Glucose Monitoring Suppl (ONE TOUCH ULTRA 2) W/DEVICE KIT Check blood sugar twice daily as instructed dx code 250.00 07/10/13   Jessee Avers, MD  glipiZIDE (GLUCOTROL XL) 5 MG 24 hr tablet Take 1 tablet (5 mg total) by mouth daily with breakfast. 02/10/14   Jessee Avers, MD  glucose blood (ONE TOUCH ULTRA TEST) test strip Check blood sugar twice daily as instructed dx code 250.00 02/10/14   Jessee Avers, MD  Lancet Devices MISC 1 kit by Does not apply route 3 (three) times daily. 06/29/12   Jessee Avers, MD  lisinopril (PRINIVIL,ZESTRIL) 10 MG tablet Take 1 tablet (10 mg total) by mouth daily. 02/10/14   Jessee Avers, MD  metoprolol tartrate (LOPRESSOR) 25 MG tablet Take 1 tablet (  25 mg total) by mouth 2 (two) times daily. 10/11/13   Dixon Boos, MD  naproxen (NAPROSYN) 500 MG tablet Take 1 tablet (500 mg total) by mouth 2 (two) times daily as needed for mild pain or moderate pain (TAKE WITH MEALS.). 02/10/14   Jessee Avers, MD  [facility] DELICA LANCETS FINE MISC Check blood sugar twice daily as instructed dx code 250.00 07/10/13   Jessee Avers, MD  sildenafil (VIAGRA) 50 MG tablet Take 1 tablet (50 mg total) by mouth daily as needed for erectile dysfunction. 11/06/13   Cresenciano Genre, MD  terbinafine (LAMISIL) 250 MG tablet Take 1 tablet (250 mg total) by mouth daily. 02/10/14    Jessee Avers, MD   BP 171/98 mmHg  Pulse 92  Temp(Src) 97.9 F (36.6 C) (Oral)  Resp 18  SpO2 99% Physical Exam  Constitutional: He appears well-developed and well-nourished. No distress.  Musculoskeletal: He exhibits tenderness.       Right knee: He exhibits normal range of motion and no swelling. Tenderness found. Medial joint line tenderness noted.       Legs: Skin: He is not diaphoretic.  Nursing note and vitals reviewed.   ED Course  Procedures (including critical care time) Labs Review Labs Reviewed - No data to display  Imaging Review No results found.   EKG Interpretation None      MDM   Final diagnoses:  Right knee pain   59 yo with persistent knee pain without known injury. He has no redness, warmth, swelling, crepitus or deformity noted. His pain was managed in ED. He was advised to follow up with orthopedics if symptoms persist. He was given a knee sleeve while in ED and conservative therapy recommended including rest, ice, compression and elevation. Pt is well-appearing, and in no acute distress. His blood pressure is noted to be elevated but he reports he has not taken his bp meds today and is going to pick-up his refills after this visit. He denies any chest pain, shortness of breath or focal neuro deficit. He appears safe to be discharged.  Return precautions provided. Patient will be dc home & is agreeable with above plan.   Filed Vitals:   06/15/14 1114 06/15/14 1125  BP: 160/83 171/98  Pulse: 95 92  Temp: 98.6 F (37 C) 97.9 F (36.6 C)  TempSrc: Oral Oral  Resp: 20 18  SpO2: 93% 99%   Meds given in ED:  Medications  naproxen (NAPROSYN) tablet 500 mg (500 mg Oral Given 06/15/14 1144)    Discharge Medication List as of 06/15/2014 11:41 AM    START taking these medications   Details  HYDROcodone-acetaminophen (NORCO/VICODIN) 5-325 MG per tablet Take 1 tablet by mouth every 4 (four) hours as needed for moderate pain or severe pain., Starting  06/15/2014, Until Discontinued, Print    !! naproxen (NAPROSYN) 500 MG tablet Take 1 tablet (500 mg total) by mouth 2 (two) times daily., Starting 06/15/2014, Until Discontinued, Print     !! - Potential duplicate medications found. Please discuss with provider.         Britt Bottom, NP 06/16/14 [id number]  Ernestina Patches, MD 06/19/14 [id number]

## 2014-06-15 NOTE — ED Notes (Signed)
Pt reports right knee pain for a month. Pt denies injury but reports severe pain with weight bearing/ambulation.

## 2014-06-30 ENCOUNTER — Encounter: Payer: Self-pay | Admitting: Internal Medicine

## 2014-06-30 ENCOUNTER — Ambulatory Visit (INDEPENDENT_AMBULATORY_CARE_PROVIDER_SITE_OTHER): Payer: Medicare Other | Admitting: Internal Medicine

## 2014-06-30 VITALS — BP 192/99 | HR 95 | Temp 97.9°F | Resp 20 | Ht 67.0 in | Wt 237.8 lb

## 2014-06-30 DIAGNOSIS — M25561 Pain in right knee: Secondary | ICD-10-CM | POA: Diagnosis not present

## 2014-06-30 DIAGNOSIS — I10 Essential (primary) hypertension: Secondary | ICD-10-CM | POA: Diagnosis not present

## 2014-06-30 DIAGNOSIS — E119 Type 2 diabetes mellitus without complications: Secondary | ICD-10-CM | POA: Diagnosis not present

## 2014-06-30 DIAGNOSIS — R197 Diarrhea, unspecified: Secondary | ICD-10-CM | POA: Diagnosis not present

## 2014-06-30 LAB — BASIC METABOLIC PANEL
BUN: 18 mg/dL (ref 6–23)
CO2: 24 mEq/L (ref 19–32)
CREATININE: 1.22 mg/dL (ref 0.50–1.35)
Calcium: 9 mg/dL (ref 8.4–10.5)
Chloride: 103 mEq/L (ref 96–112)
Glucose, Bld: 91 mg/dL (ref 70–99)
Potassium: 4.2 mEq/L (ref 3.5–5.3)
Sodium: 138 mEq/L (ref 135–145)

## 2014-06-30 LAB — LIPID PANEL
CHOLESTEROL: 170 mg/dL (ref 0–200)
HDL: 28 mg/dL — AB (ref >=40)
LDL Cholesterol: 97 mg/dL (ref 0–99)
Total CHOL/HDL Ratio: 6.1 Ratio
Triglycerides: 225 mg/dL — ABNORMAL HIGH (ref <150)
VLDL: 45 mg/dL — ABNORMAL HIGH (ref 0–40)

## 2014-06-30 LAB — POCT GLYCOSYLATED HEMOGLOBIN (HGB A1C): Hemoglobin A1C: 6.7

## 2014-06-30 LAB — GLUCOSE, CAPILLARY: Glucose-Capillary: 96 mg/dL (ref 70–99)

## 2014-06-30 MED ORDER — BENAZEPRIL-HYDROCHLOROTHIAZIDE 20-25 MG PO TABS: 1.0000 | ORAL_TABLET | Freq: Every day | ORAL | Status: DC

## 2014-06-30 MED ORDER — COLCHICINE 0.6 MG PO TABS: 0.6000 mg | ORAL_TABLET | Freq: Every day | ORAL | Status: DC

## 2014-06-30 NOTE — Progress Notes (Signed)
Subjective:   Patient ID: Daniel Perkins male   DOB: Mar 07, 1956 59 y.o.   MRN: [id number]  HPI: Mr.[name] is a 59 y.o. man pmh as listed below presents for some on going knee pain.   Pt was seen in the ED and given some pain medication (opiates and NSAIDs) and didn't have imaging performed at that time. His pain was thought 2/2 MSK. He has a hx of gout that is usually in his ankles or wrists and he has not had recently exacerbations and continues with his allopurinol. He denies having any trauma to the area and has been wrapping and using crutches to help with ambulation. He has not had any fevers or chills, erythema or warmth to the area, rashes, new sexual partners, injury or surgery in that knee, but has been working some on jobs lately doing Architect.  Of note his blood pressure was high and he states that he has recently had a URI that he has been treating with some over-the-counter medications he does not think any of them contain Sudafed. He has been taking Mucinex with relief. He's had multiple sick contacts with similar symptoms. He denied any chest pain, dyspnea on exertion, shortness of breath, lower externally edema.   Past Medical History  Diagnosis Date  . Coronary artery disease     Holter monitor 02/2012 - sinus with rare PVC  . Gout   . Hyperlipidemia   . Hypertension   . Osteomyelitis   . CTEV (congenital talipes equinovarus)   . Myocardial infarction ~ 2007    "mild"  . Type II diabetes mellitus   . Club foot of both lower extremities 1958  . Pneumonia 2000's    "couple times"   Current Outpatient Prescriptions  Medication Sig Dispense Refill  . albuterol (PROVENTIL HFA;VENTOLIN HFA) 108 (90 BASE) MCG/ACT inhaler Inhale 1-2 puffs into the lungs every 6 (six) hours as needed for wheezing or shortness of breath. 1 Inhaler 0  . allopurinol (ZYLOPRIM) 100 MG tablet Take 1 tablet (100 mg total) by mouth daily. 30 tablet 2  .  benazepril-hydrochlorthiazide (LOTENSIN HCT) 20-25 MG per tablet Take 1 tablet by mouth daily. 30 tablet 1  . Blood Glucose Monitoring Suppl (ONE TOUCH ULTRA 2) W/DEVICE KIT Check blood sugar twice daily as instructed dx code 250.00 1 each 0  . colchicine 0.6 MG tablet Take 1 tablet (0.6 mg total) by mouth daily. 30 tablet 2  . glipiZIDE (GLUCOTROL XL) 5 MG 24 hr tablet Take 1 tablet (5 mg total) by mouth daily with breakfast. 30 tablet 1  . glucose blood (ONE TOUCH ULTRA TEST) test strip Check blood sugar twice daily as instructed dx code 250.00 100 each 5  . HYDROcodone-acetaminophen (NORCO/VICODIN) 5-325 MG per tablet Take 1 tablet by mouth every 4 (four) hours as needed for moderate pain or severe pain. 10 tablet 0  . Lancet Devices MISC 1 kit by Does not apply route 3 (three) times daily. 100 each 11  . naproxen (NAPROSYN) 500 MG tablet Take 1 tablet (500 mg total) by mouth 2 (two) times daily. 30 tablet 0  . ONETOUCH DELICA LANCETS FINE MISC Check blood sugar twice daily as instructed dx code 250.00 100 each 12  . sildenafil (VIAGRA) 50 MG tablet Take 1 tablet (50 mg total) by mouth daily as needed for erectile dysfunction. 2 tablet 0   No current facility-administered medications for this visit.   Family History  Problem Relation Age of Onset  .  Diabetes Mother   . Coronary artery disease Mother     HAS PACEMAKER  . Heart disease Mother   . Heart attack Brother    History   Social History  . Marital Status: Legally Separated    Spouse Name: N/A  . Number of Children: N/A  . Years of Education: 13   Occupational History  .    . unemployed    Social History Main Topics  . Smoking status: Former Smoker -- 1.00 packs/day for 32 years    Types: Cigarettes    Quit date: 07/06/2007  . Smokeless tobacco: Never Used  . Alcohol Use: Yes     Comment: "usually drink one small can beer/year; birthday or superbowl"  . Drug Use: No  . Sexual Activity: Not on file   Other Topics  Concern  . None   Social History Narrative   Review of Systems: Pertinent items are noted in HPI. Objective:  Physical Exam: Filed Vitals:   06/30/14 1610 06/30/14 1622  BP: 193/101 192/99  Pulse: 95 95  Temp: 97.9 F (36.6 C)   TempSrc: Oral   Resp: 20   Height: '5\' 7"'  (1.702 m)   Weight: 237 lb 12.8 oz (107.865 kg)   SpO2: 99% 98%   General: sitting in chair, NAD  HEENT: PERRL, EOMI, no scleral icterus Cardiac: RRR, no rubs, murmurs or gallops Pulm: clear to auscultation bilaterally, moving normal volumes of air Abd: soft, nontender, nondistended, BS present Ext: warm and well perfused, no pedal edema, bilateral knees full range of motion right limited by pain, 2 DTRs, 5 out of 5 lower extremity strength bilaterally, external rotation of right knee exacerbates pain, no palpable effusion, erythema or warmth Neuro: alert and oriented X3, cranial nerves II-XII grossly intact  Assessment & Plan:  Please see problem oriented charting  Pt discussed with Dr. Dareen Piano

## 2014-06-30 NOTE — Patient Instructions (Signed)
General Instructions:   Thank you for bringing your medicines today. This helps us keep you safe from mistakes.  We changed your Blood pressure medicine: take the combination pill and follow up in 2 wks.   We are getting xrays of your knee.  Take your gout medicine  Progress Toward Treatment Goals:  Treatment Goal 02/10/2014  Hemoglobin A1C at goal  Blood pressure unchanged    Self Care Goals & Plans:  Self Care Goal 02/10/2014  Manage my medications take my medicines as prescribed; bring my medications to every visit; refill my medications on time  Monitor my health -  Eat healthy foods drink diet soda or water instead of juice or soda; eat more vegetables; eat foods that are low in salt; eat baked foods instead of fried foods; eat fruit for snacks and desserts  Be physically active -  Meeting treatment goals -    Home Blood Glucose Monitoring 02/10/2014  Check my blood sugar once a day  When to check my blood sugar before breakfast     Care Management & Community Referrals:  Referral 02/10/2014  Referrals made for care management support diabetes educator  Referrals made to community resources -

## 2014-07-01 ENCOUNTER — Ambulatory Visit (HOSPITAL_COMMUNITY)
Admission: RE | Admit: 2014-07-01 | Discharge: 2014-07-01 | Disposition: A | Discharge status: Home / Self Care | Payer: Medicare Other | Source: Ambulatory Visit | Attending: Internal Medicine | Admitting: Internal Medicine

## 2014-07-01 DIAGNOSIS — M25561 Pain in right knee: Secondary | ICD-10-CM | POA: Diagnosis not present

## 2014-07-01 LAB — MICROALBUMIN / CREATININE URINE RATIO
Creatinine, Urine: 197 mg/dL
Microalb Creat Ratio: 2.5 mg/g (ref 0.0–30.0)
Microalb, Ur: 0.5 mg/dL (ref <2.0)

## 2014-07-01 NOTE — Assessment & Plan Note (Signed)
This does not seem secondary to an acute gout flare or septic arthritis. The patient has not had recent imaging done but does not member having any trauma. 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 He had some minimal relief with NSAIDs. -X-ray of right knee -Given colchicine just in case some component of gout -Referral to sports medicine

## 2014-07-01 NOTE — Assessment & Plan Note (Signed)
Lab Results  Component Value Date   HGBA1C 6.7 06/30/2014   HGBA1C 6.8 02/10/2014   HGBA1C 7.7 10/02/2013     Assessment: Diabetes control:   Progress toward A1C goal:    Comments: pt continues to maintain good control  Plan: Medications:  Continue glipizide 5mg  qd  Home glucose monitoring: didn't bring in today for review Frequency:   Timing:   Instruction/counseling given: reminded to get eye exam, reminded to bring blood glucose meter & log to each visit, reminded to bring medications to each visit, discussed foot care, discussed the need for weight loss and discussed diet Educational resources provided:   Self management tools provided:   Other plans: pt FLP, urine microablumin completed today

## 2014-07-01 NOTE — Assessment & Plan Note (Signed)
BP Readings from Last 3 Encounters:  06/30/14 192/99  06/15/14 171/98  02/10/14 169/76    Lab Results  Component Value Date   NA 138 06/30/2014   K 4.2 06/30/2014   CREATININE 1.22 06/30/2014    Assessment: Blood pressure control:   Progress toward BP goal:    Comments: pt has only been taking one of the previously prescribed medications of lisinopril 10mg   Plan: Medications:  Will prescribe benazepril-HCTZ 20-25 milligram daily and follow-up in 2 weeks May need to add other agents patient had been previously on a beta blocker that he discontinued Educational resources provided:   Self management tools provided:   Other plans: Follow-up in 2 weeks for blood pressure recheck titration and bmet

## 2014-07-02 NOTE — Progress Notes (Signed)
INTERNAL MEDICINE TEACHING ATTENDING ADDENDUM - Javonnie Illescas, MD: I reviewed and discussed at the time of visit with the resident Dr. Sadek, the patient's medical history, physical examination, diagnosis and results of pertinent tests and treatment and I agree with the patient's care as documented.  

## 2014-07-07 ENCOUNTER — Telehealth: Payer: Self-pay | Admitting: *Deleted

## 2014-07-07 ENCOUNTER — Ambulatory Visit: Payer: Medicare Other | Admitting: Family Medicine

## 2014-07-07 NOTE — Telephone Encounter (Signed)
Pt stopped by clinic to find out results right knee xray - osteoarthritic change per Dr Heide SparkNarendra. Had appt with Sports Medicine today - copayment was $40.00 -  Pt does not have money to pay co-payment. Pt wants to know what to do from here. Pt states right knee hurts. Pt wants you to call friend Marcelino DusterMichelle at [phone]. [facility].  Stanton KidneyDebra Neshawn Aird RN 07/07/14 11:30AM

## 2014-07-09 NOTE — Addendum Note (Signed)
Addended by: Neomia DearPOWERS, Haniel Fix E on: 07/09/2014 06:29 PM   Modules accepted: Orders

## 2014-07-19 NOTE — Telephone Encounter (Signed)
Pt can make an appointment to be re-evaluated. If pain is worse or unbearable pt can be seen in ED. Thanks.

## 2014-07-24 NOTE — Telephone Encounter (Signed)
[facility] still having problems with right knee. Will tell pt to call clinic and sch an appt to re-evaluate right knee as directed Dr Burtis JunesSadek.

## 2014-08-20 ENCOUNTER — Encounter: Payer: Self-pay | Admitting: Internal Medicine

## 2014-09-05 ENCOUNTER — Encounter: Payer: Self-pay | Admitting: Internal Medicine

## 2014-09-05 ENCOUNTER — Ambulatory Visit: Payer: Medicare Other | Admitting: Dietician

## 2014-09-05 ENCOUNTER — Ambulatory Visit: Payer: Medicare Other | Admitting: Internal Medicine

## 2014-09-08 ENCOUNTER — Other Ambulatory Visit: Payer: Self-pay | Admitting: *Deleted

## 2014-09-08 DIAGNOSIS — E119 Type 2 diabetes mellitus without complications: Secondary | ICD-10-CM

## 2014-09-09 MED ORDER — GLIPIZIDE ER 5 MG PO TB24: 5.0000 mg | ORAL_TABLET | Freq: Every day | ORAL | Status: DC

## 2014-09-09 NOTE — Telephone Encounter (Signed)
rx faxed in 

## 2014-09-23 ENCOUNTER — Telehealth: Payer: Self-pay | Admitting: Internal Medicine

## 2014-09-23 ENCOUNTER — Encounter: Payer: Self-pay | Admitting: *Deleted

## 2014-09-23 NOTE — Telephone Encounter (Signed)
Call to patient to confirm appointment for 09/24/14 at 3:45 lmtcb

## 2014-09-24 ENCOUNTER — Ambulatory Visit (INDEPENDENT_AMBULATORY_CARE_PROVIDER_SITE_OTHER): Payer: Medicare Other | Admitting: Internal Medicine

## 2014-09-24 ENCOUNTER — Encounter: Payer: Self-pay | Admitting: Internal Medicine

## 2014-09-24 VITALS — BP 113/76 | HR 104 | Temp 98.3°F | Ht 67.0 in | Wt 236.2 lb

## 2014-09-24 DIAGNOSIS — R252 Cramp and spasm: Secondary | ICD-10-CM

## 2014-09-24 DIAGNOSIS — Z8679 Personal history of other diseases of the circulatory system: Secondary | ICD-10-CM | POA: Diagnosis not present

## 2014-09-24 DIAGNOSIS — E119 Type 2 diabetes mellitus without complications: Secondary | ICD-10-CM

## 2014-09-24 DIAGNOSIS — R079 Chest pain, unspecified: Secondary | ICD-10-CM | POA: Diagnosis not present

## 2014-09-24 DIAGNOSIS — I10 Essential (primary) hypertension: Secondary | ICD-10-CM

## 2014-09-24 LAB — COMPREHENSIVE METABOLIC PANEL
ALT: 25 U/L (ref 17–63)
ANION GAP: 9 (ref 5–15)
AST: 29 U/L (ref 15–41)
Albumin: 3.7 g/dL (ref 3.5–5.0)
Alkaline Phosphatase: 108 U/L (ref 38–126)
BUN: 22 mg/dL — ABNORMAL HIGH (ref 6–20)
CALCIUM: 9.3 mg/dL (ref 8.9–10.3)
CO2: 25 mmol/L (ref 22–32)
Chloride: 101 mmol/L (ref 101–111)
Creatinine, Ser: 1.2 mg/dL (ref 0.61–1.24)
GFR calc non Af Amer: >60 mL/min (ref >60)
Glucose, Bld: 117 mg/dL — ABNORMAL HIGH (ref 65–99)
Potassium: 4.5 mmol/L (ref 3.5–5.1)
SODIUM: 135 mmol/L (ref 135–145)
TOTAL PROTEIN: 7.8 g/dL (ref 6.5–8.1)
Total Bilirubin: 0.3 mg/dL (ref 0.3–1.2)

## 2014-09-24 LAB — CK TOTAL AND CKMB (NOT AT ARMC)
CK, MB: 3.7 ng/mL (ref 0.5–5.0)
Relative Index: 1.1 (ref 0.0–2.5)
Total CK: 346 U/L (ref 49–397)

## 2014-09-24 LAB — GLUCOSE, CAPILLARY: Glucose-Capillary: 125 mg/dL — ABNORMAL HIGH (ref 65–99)

## 2014-09-24 LAB — POCT GLYCOSYLATED HEMOGLOBIN (HGB A1C): Hemoglobin A1C: 7.3

## 2014-09-24 NOTE — Patient Instructions (Addendum)
General Instructions: Please do not take Benazepril-HCTZ until I see again in a week Please keep yourself hydrated with increased heat in the summer  I will call with results from your blood work  Please do not take Viagra  Please come back to see me in one week  Please bring your medicines with you each time you come to clinic.  Medicines may include prescription medications, over-the-counter medications, herbal remedies, eye drops, vitamins, or other pills.   Progress Toward Treatment Goals:  Treatment Goal 09/24/2014  Hemoglobin A1C deteriorated  Blood pressure at goal    Self Care Goals & Plans:  Self Care Goal 02/10/2014  Manage my medications take my medicines as prescribed; bring my medications to every visit; refill my medications on time  Monitor my health -  Eat healthy foods drink diet soda or water instead of juice or soda; eat more vegetables; eat foods that are low in salt; eat baked foods instead of fried foods; eat fruit for snacks and desserts  Be physically active -  Meeting treatment goals -    Home Blood Glucose Monitoring 09/24/2014  Check my blood sugar once a day  When to check my blood sugar -     Care Management & Community Referrals:  Referral 02/10/2014  Referrals made for care management support diabetes educator  Referrals made to community resources -

## 2014-09-24 NOTE — Assessment & Plan Note (Signed)
Lab Results  Component Value Date   HGBA1C 7.3 09/24/2014   HGBA1C 6.7 06/30/2014   HGBA1C 6.8 02/10/2014     Assessment: Diabetes control: fair control Progress toward A1C goal:  deteriorated Comments: Has been off Glipizide for several weeks due to issues with his pharmacy. But he was contacted to pick it up yesterday  Plan: Medications:  cont with Glipizide 5 mg daily  Home glucose monitoring: Frequency: once a day Timing:   Instruction/counseling given: no instruction/counseling  Educational resources provided:   Self management tools provided:   Other plans: f/u routinely.

## 2014-09-24 NOTE — Assessment & Plan Note (Signed)
BP Readings from Last 3 Encounters:  09/24/14 113/76  06/30/14 192/99  06/15/14 171/98    Lab Results  Component Value Date   NA 138 06/30/2014   K 4.2 06/30/2014   CREATININE 1.22 06/30/2014    Assessment: Blood pressure control: controlled Progress toward BP goal:  at goal Comments: on Benazepril-HCTZ 20-25mg  daily. He self-discontinued metoprolol about 5 months ago.  Plan: Medications:  hold medication in the setting of muscle cramps and hypotension Educational resources provided:   Self management tools provided:   Other plans: cmp today F/u in one week

## 2014-09-24 NOTE — Progress Notes (Signed)
Patient ID: Daniel Perkins, male   DOB: Nov 14, 1955, 59 y.o.   MRN: [id number]   Subjective:   HPI: [name] is a 59 y.o. African American gentleman, with past medical history of diabetes, hypertension, obesity, coronary artery disease, and statin intolerance, presents for an acute visit.  1. Muscle cramps for 2 weeks. Intermittently and located under his ribs. Patient describes his muscle cramps as mild to moderate, exacerbated by movements like when he puts on his clothes or walks a flight of stairs.. He also describes aches in his calf muscles as well. He had a similar presentation last spring requiring hospitalization for IV rehydration. He is currently on Benazepril-HCTZ. He is not orthostatic today. No recent excessive heat exposure.  2. Chest pain: Sometimes he experiences chest pains is associated with a burning sensation across his chest which last for about 30-40 seconds before it resolves. He states that he feels dizzy when he stands quickly and he has to sit down before he feels better. He also describes associated shortness breath but no palpitations. He gets scared with these episodes fearful of a "heart attack". He denies PND, orthopnea, swelling of his legs or any other cardiopulmonary symptoms. No passing out. He has a history of mild nonobstructive CAD on left heart catheterization in April 2011. His last echocardiogram around the same time revealed normal LV function but with diastolic dysfunction. Stress Myoview in April 2012 revealed no ischemia. He was seen by cardiology in 2014, but he never followed up with them after that. He quit smoking in 2008.   Kindly see the A&P for the status of the pt's chronic medical problems.   ROS: Constitutional: Denies fever, chills, diaphoresis, appetite change and fatigue.  Respiratory: Denies SOB, DOE, cough, chest tightness, and wheezing. Denies chest pain with exertion. CVS: No chest pain, palpitations and leg swelling.    GI: No abdominal pain, nausea, vomiting, bloody stools GU: No dysuria, frequency, hematuria, or flank pain.  MSK: No back pain, joint swelling, arthralgias  Psych: No depression symptoms. No SI or SA.   Past Medical History  Diagnosis Date  . Coronary artery disease     Holter monitor 02/2012 - sinus with rare PVC  . Gout   . Hyperlipidemia   . Hypertension   . Osteomyelitis   . CTEV (congenital talipes equinovarus)   . Myocardial infarction ~ 2007    "mild"  . Type II diabetes mellitus   . Club foot of both lower extremities 1958  . Pneumonia 2000's    "couple times"   Current Outpatient Prescriptions  Medication Sig Dispense Refill  . albuterol (PROVENTIL HFA;VENTOLIN HFA) 108 (90 BASE) MCG/ACT inhaler Inhale 1-2 puffs into the lungs every 6 (six) hours as needed for wheezing or shortness of breath. 1 Inhaler 0  . allopurinol (ZYLOPRIM) 100 MG tablet Take 1 tablet (100 mg total) by mouth daily. 30 tablet 2  . benazepril-hydrochlorthiazide (LOTENSIN HCT) 20-25 MG per tablet Take 1 tablet by mouth daily. 30 tablet 1  . Blood Glucose Monitoring Suppl (ONE TOUCH ULTRA 2) W/DEVICE KIT Check blood sugar twice daily as instructed dx code 250.00 1 each 0  . colchicine 0.6 MG tablet Take 1 tablet (0.6 mg total) by mouth daily. 30 tablet 2  . glipiZIDE (GLUCOTROL XL) 5 MG 24 hr tablet Take 1 tablet (5 mg total) by mouth daily with breakfast. 30 tablet 1  . glucose blood (ONE TOUCH ULTRA TEST) test strip Check blood sugar twice daily as instructed dx code  250.00 100 each 5  . HYDROcodone-acetaminophen (NORCO/VICODIN) 5-325 MG per tablet Take 1 tablet by mouth every 4 (four) hours as needed for moderate pain or severe pain. 10 tablet 0  . Lancet Devices MISC 1 kit by Does not apply route 3 (three) times daily. 100 each 11  . naproxen (NAPROSYN) 500 MG tablet Take 1 tablet (500 mg total) by mouth 2 (two) times daily. 30 tablet 0  . ONETOUCH DELICA LANCETS FINE MISC Check blood sugar twice  daily as instructed dx code 250.00 100 each 12  . sildenafil (VIAGRA) 50 MG tablet Take 1 tablet (50 mg total) by mouth daily as needed for erectile dysfunction. 2 tablet 0   No current facility-administered medications for this visit.   Family History  Problem Relation Age of Onset  . Diabetes Mother   . Coronary artery disease Mother     HAS PACEMAKER  . Heart disease Mother   . Heart attack Brother    History   Social History  . Marital Status: Legally Separated    Spouse Name: N/A  . Number of Children: N/A  . Years of Education: 13   Occupational History  .    . unemployed    Social History Main Topics  . Smoking status: Former Smoker -- 1.00 packs/day for 32 years    Types: Cigarettes    Quit date: 07/06/2007  . Smokeless tobacco: Never Used  . Alcohol Use: Yes     Comment: "usually drink one small can beer/year; birthday or superbowl"  . Drug Use: No  . Sexual Activity: Not on file   Other Topics Concern  . None   Social History Narrative    Objective:  Physical Exam: Filed Vitals:   09/24/14 1600  BP: 113/76  Pulse: 104  Temp: 98.3 F (36.8 C)  TempSrc: Oral  Height: _0  (1.702 m)  Weight: 236 lb 3.2 oz (107.14 kg)  SpO2: 99%  Not orthostatic.  General: Well nourished. No acute distress.  HEENT: Normal oral mucosa. MMM.  Lungs: CTA bilaterally. Heart: tachycardia RRR; no extra sounds or murmurs  Abdomen: Non-distended, normal BS, soft, nontender; no hepatosplenomegaly  Extremities: No pedal edema. No joint swelling or tenderness.no muscle tenderness Neurologic: Normal EOM,  Alert and oriented x3. No obvious neurologic/cranial nerve deficits.  Assessment & Plan:  I have discussed with Dr Eppie Gibson   See problem based charting.

## 2014-09-24 NOTE — Assessment & Plan Note (Signed)
Assessment: Most likely etiology is volume depletion in the setting thiazide therapy, presenting with a muscle cramps. Symptoms tend to worsen during hot weather months. He was hospitalized in June 2012 for 2 days with severe muscle cramps, and elevated creatinine and last spring for a very similar presentation. Review of his medications does not reveal any culprits - he has had several statin induced myopathy. Patient denies heat exposure, but it is possible that his fluid intake might be inadequate given increasingly outside temperatures.  Ddx: I do not suspect autoimmune or infectious etiology. TSH has been normal previously.   Plan: 1. Labs/imaging: CMP, CK  2. Therapy: Recommended adequate fluid rehydration. Stop Benazepril and HCTZ for now 3. Follow up in a week to evaluate symptoms and volume status

## 2014-09-24 NOTE — Assessment & Plan Note (Addendum)
Given patient's significant past medical history of coronary artery disease, and his reported history of chest pains, which might be related to muscle cramps, and shortness of breath I ordered an EKG in the office, which does not reveal any significant change compared to her prior EKG on 10/02/2013. I do not suspect that he has described shortness of breath and chest pain is cardiac in origin but if he does not improve with increased rehydration and holding of his antihypertensive medications over the next 1 week, I will refer him to cardiology for further assessment. He has been off Metoprolol due to noncompliance. He has not followed up with cardiology since 2014. Plan  Consider cardiology referral if he does not improve after holding antihypertensives. Consider initiation of metoprolol and aspirin on next visit based on BP

## 2014-09-25 NOTE — Progress Notes (Signed)
Case discussed with Dr. Kazibwe at the time of the visit.  We reviewed the resident's history and exam and pertinent patient test results.  I agree with the assessment, diagnosis and plan of care documented in the resident's note. 

## 2014-10-01 ENCOUNTER — Encounter: Payer: Self-pay | Admitting: Internal Medicine

## 2014-10-01 ENCOUNTER — Encounter: Payer: Self-pay | Admitting: Dietician

## 2014-10-01 ENCOUNTER — Ambulatory Visit (INDEPENDENT_AMBULATORY_CARE_PROVIDER_SITE_OTHER): Payer: Medicare Other | Admitting: Internal Medicine

## 2014-10-01 ENCOUNTER — Ambulatory Visit: Payer: Medicare Other | Admitting: Dietician

## 2014-10-01 VITALS — BP 167/91 | HR 100 | Temp 98.0°F | Ht 67.0 in | Wt 234.3 lb

## 2014-10-01 DIAGNOSIS — G4733 Obstructive sleep apnea (adult) (pediatric): Secondary | ICD-10-CM

## 2014-10-01 DIAGNOSIS — R079 Chest pain, unspecified: Secondary | ICD-10-CM

## 2014-10-01 DIAGNOSIS — Z9119 Patient's noncompliance with other medical treatment and regimen: Secondary | ICD-10-CM

## 2014-10-01 DIAGNOSIS — I2510 Atherosclerotic heart disease of native coronary artery without angina pectoris: Secondary | ICD-10-CM

## 2014-10-01 DIAGNOSIS — G4739 Other sleep apnea: Secondary | ICD-10-CM | POA: Insufficient documentation

## 2014-10-01 DIAGNOSIS — E119 Type 2 diabetes mellitus without complications: Secondary | ICD-10-CM | POA: Diagnosis not present

## 2014-10-01 DIAGNOSIS — Z6836 Body mass index (BMI) 36.0-36.9, adult: Secondary | ICD-10-CM

## 2014-10-01 DIAGNOSIS — E669 Obesity, unspecified: Secondary | ICD-10-CM

## 2014-10-01 DIAGNOSIS — I10 Essential (primary) hypertension: Secondary | ICD-10-CM

## 2014-10-01 LAB — HM DIABETES EYE EXAM

## 2014-10-01 MED ORDER — CARVEDILOL 3.125 MG PO TABS: 3.1250 mg | ORAL_TABLET | Freq: Two times a day (BID) | ORAL | Status: DC

## 2014-10-01 MED ORDER — GLIPIZIDE ER 5 MG PO TB24: 5.0000 mg | ORAL_TABLET | Freq: Every day | ORAL | Status: DC

## 2014-10-01 MED ORDER — ASPIRIN EC 81 MG PO TBEC: 81.0000 mg | DELAYED_RELEASE_TABLET | Freq: Every day | ORAL | Status: DC

## 2014-10-01 NOTE — Assessment & Plan Note (Signed)
Discussed in detail about weight loss through lifestyle modification. Patient declines referral to Daniel LeashDonna, explaining that he has seen her before.

## 2014-10-01 NOTE — Patient Instructions (Signed)
General Instructions: I will send you to heart doctor  I will need a sleep study  Please start taking asprin 81 mg every day  Please start taking Carvediolol 3.125 mg twice daily Please come back in 2 weeks   Please bring your medicines with you each time you come to clinic.  Medicines may include prescription medications, over-the-counter medications, herbal remedies, eye drops, vitamins, or other pills.   Progress Toward Treatment Goals:  Treatment Goal 09/24/2014  Hemoglobin A1C deteriorated  Blood pressure at goal    Self Care Goals & Plans:  Self Care Goal 02/10/2014  Manage my medications take my medicines as prescribed; bring my medications to every visit; refill my medications on time  Monitor my health -  Eat healthy foods drink diet soda or water instead of juice or soda; eat more vegetables; eat foods that are low in salt; eat baked foods instead of fried foods; eat fruit for snacks and desserts  Be physically active -  Meeting treatment goals -    Home Blood Glucose Monitoring 09/24/2014  Check my blood sugar once a day  When to check my blood sugar -     Care Management & Community Referrals:  Referral 02/10/2014  Referrals made for care management support diabetes educator  Referrals made to community resources -     Sleep Apnea  Sleep apnea is a sleep disorder characterized by abnormal pauses in breathing while you sleep. When your breathing pauses, the level of oxygen in your blood decreases. This causes you to move out of deep sleep and into light sleep. As a result, your quality of sleep is poor, and the system that carries your blood throughout your body (cardiovascular system) experiences stress. If sleep apnea remains untreated, the following conditions can develop:  High blood pressure (hypertension).  Coronary artery disease.  Inability to achieve or maintain an erection (impotence).  Impairment of your thought process (cognitive  dysfunction). There are three types of sleep apnea: 1. Obstructive sleep apnea--Pauses in breathing during sleep because of a blocked airway. 2. Central sleep apnea--Pauses in breathing during sleep because the area of the brain that controls your breathing does not send the correct signals to the muscles that control breathing. 3. Mixed sleep apnea--A combination of both obstructive and central sleep apnea. RISK FACTORS The following risk factors can increase your risk of developing sleep apnea:  Being overweight.  Smoking.  Having narrow passages in your nose and throat.  Being of older age.  Being male.  Alcohol use.  Sedative and tranquilizer use.  Ethnicity. Among individuals younger than 35 years, African Americans are at increased risk of sleep apnea. SYMPTOMS   Difficulty staying asleep.  Daytime sleepiness and fatigue.  Loss of energy.  Irritability.  Loud, heavy snoring.  Morning headaches.  Trouble concentrating.  Forgetfulness.  Decreased interest in sex. DIAGNOSIS  In order to diagnose sleep apnea, your caregiver will perform a physical examination. Your caregiver may suggest that you take a home sleep test. Your caregiver may also recommend that you spend the night in a sleep lab. In the sleep lab, several monitors record information about your heart, lungs, and brain while you sleep. Your leg and arm movements and blood oxygen level are also recorded. TREATMENT The following actions may help to resolve mild sleep apnea:  Sleeping on your side.   Using a decongestant if you have nasal congestion.   Avoiding the use of depressants, including alcohol, sedatives, and narcotics.   Losing  weight and modifying your diet if you are overweight. There also are devices and treatments to help open your airway:  Oral appliances. These are custom-made mouthpieces that shift your lower jaw forward and slightly open your bite. This opens your  airway.  Devices that create positive airway pressure. This positive pressure "splints" your airway open to help you breathe better during sleep. The following devices create positive airway pressure:  Continuous positive airway pressure (CPAP) device. The CPAP device creates a continuous level of air pressure with an air pump. The air is delivered to your airway through a mask while you sleep. This continuous pressure keeps your airway open.  Nasal expiratory positive airway pressure (EPAP) device. The EPAP device creates positive air pressure as you exhale. The device consists of single-use valves, which are inserted into each nostril and held in place by adhesive. The valves create very little resistance when you inhale but create much more resistance when you exhale. That increased resistance creates the positive airway pressure. This positive pressure while you exhale keeps your airway open, making it easier to breath when you inhale again.  Bilevel positive airway pressure (BPAP) device. The BPAP device is used mainly in patients with central sleep apnea. This device is similar to the CPAP device because it also uses an air pump to deliver continuous air pressure through a mask. However, with the BPAP machine, the pressure is set at two different levels. The pressure when you exhale is lower than the pressure when you inhale.  Surgery. Typically, surgery is only done if you cannot comply with less invasive treatments or if the less invasive treatments do not improve your condition. Surgery involves removing excess tissue in your airway to create a wider passage way. Document Released: 04/15/2002 Document Revised: 08/20/2012 Document Reviewed: 09/01/2011 [facility] Patient Information 2015 [place], [place]. This information is not intended to replace advice given to you by your health care provider. Make sure you discuss any questions you have with your health care provider.

## 2014-10-01 NOTE — Progress Notes (Signed)
Patient ID: Aayden Cefalu, male   DOB: Jun 23, 1955, 59 y.o.   MRN: [id number]   Subjective:   HPI: Mr.Quantavius Menta is a 59 y.o. African American gentleman, with past medical history of diabetes, hypertension, obesity, coronary artery disease, and statin intolerance, presents for a follow up visit. I saw him a week ago.   1. Muscle cramps: during his last visit this was felt to be related to HCTZ and dehydration. Labs including CMP and CK were normal. I recommended holding HCTZ and follow up. He reports that the cramping under ribs had improved, but he reports a cramp happening in his left lower extremity today X1. No new symptoms. He has been rehydrating aggressively with water. No recent excessive heat exposure.  2. Chest pain: He continues to report intermittent chest pains which improved since his last visit, but he does report that today he experienced some shortness of breath and chest pain after walking a short distance. It resolved after 5 minutes. Previously, his chest pain was associated with a burning sensation across his chest which last for about 30-40 seconds before it resolves. Prior dizziness has improved. He denies PND, orthopnea, swelling of his legs or any other cardiopulmonary symptoms. No passing out. He has a history of mild nonobstructive CAD on left heart catheterization in April 2011. His last echocardiogram around the same time revealed normal LV function but with diastolic dysfunction. Stress Myoview in April 2012 revealed no ischemia. He was seen by cardiology in 2014, but he never followed up with them after that. He quit smoking in 2008.  3. Obstructive sleep apnea: Patient reports excessive sleepiness during daytime and multiple awakening episodes at night. He has been told that he snores a lot at times. He denies waking up with gasping for air. He has never had a sleep study. No increased fatigue.    Kindly see the A&P for the status of the pt's chronic medical  problems.   ROS: Constitutional: Denies fever, chills, diaphoresis, appetite change and fatigue.  Respiratory: Denies cough, chest tightness, and wheezing.  CVS: No palpitations and leg swelling.  GI: No abdominal pain, nausea, vomiting, bloody stools GU: No dysuria, frequency, hematuria, or flank pain.  MSK: left LE cramping today. No back pain, joint swelling, arthralgias  Psych: No depression symptoms. No SI or SA.   Past Medical History  Diagnosis Date  . Coronary artery disease     Holter monitor 02/2012 - sinus with rare PVC  . Gout   . Hyperlipidemia   . Hypertension   . Osteomyelitis   . CTEV (congenital talipes equinovarus)   . Myocardial infarction ~ 2007    "mild"  . Type II diabetes mellitus   . Club foot of both lower extremities 1958  . Pneumonia 2000's    "couple times"   Current Outpatient Prescriptions  Medication Sig Dispense Refill  . albuterol (PROVENTIL HFA;VENTOLIN HFA) 108 (90 BASE) MCG/ACT inhaler Inhale 1-2 puffs into the lungs every 6 (six) hours as needed for wheezing or shortness of breath. 1 Inhaler 0  . allopurinol (ZYLOPRIM) 100 MG tablet Take 1 tablet (100 mg total) by mouth daily. 30 tablet 2  . Blood Glucose Monitoring Suppl (ONE TOUCH ULTRA 2) W/DEVICE KIT Check blood sugar twice daily as instructed dx code 250.00 1 each 0  . colchicine 0.6 MG tablet Take 1 tablet (0.6 mg total) by mouth daily. 30 tablet 2  . glipiZIDE (GLUCOTROL XL) 5 MG 24 hr tablet Take 1 tablet (5 mg total)  by mouth daily with breakfast. 30 tablet 1  . glucose blood (ONE TOUCH ULTRA TEST) test strip Check blood sugar twice daily as instructed dx code 250.00 100 each 5  . Lancet Devices MISC 1 kit by Does not apply route 3 (three) times daily. 100 each 11  . ONETOUCH DELICA LANCETS FINE MISC Check blood sugar twice daily as instructed dx code 250.00 100 each 12  . sildenafil (VIAGRA) 50 MG tablet Take 1 tablet (50 mg total) by mouth daily as needed for erectile dysfunction.  2 tablet 0  . aspirin EC 81 MG tablet Take 1 tablet (81 mg total) by mouth daily. 30 tablet 3  . carvedilol (COREG) 3.125 MG tablet Take 1 tablet (3.125 mg total) by mouth 2 (two) times daily with a meal. 60 tablet 2   No current facility-administered medications for this visit.   Family History  Problem Relation Age of Onset  . Diabetes Mother   . Coronary artery disease Mother     HAS PACEMAKER  . Heart disease Mother   . Heart attack Brother    History   Social History  . Marital Status: Legally Separated    Spouse Name: N/A  . Number of Children: N/A  . Years of Education: 13   Occupational History  .    . unemployed    Social History Main Topics  . Smoking status: Former Smoker -- 1.00 packs/day for 32 years    Types: Cigarettes    Quit date: 07/06/2007  . Smokeless tobacco: Never Used  . Alcohol Use: Yes     Comment: "usually drink one small can beer/year; birthday or superbowl"  . Drug Use: No  . Sexual Activity: Not on file   Other Topics Concern  . None   Social History Narrative    Objective:  Physical Exam: Filed Vitals:   10/01/14 1514  BP: 167/91  Pulse: 100  Temp: 98 F (36.7 C)  TempSrc: Oral  Height: 5' 7" (1.702 m)  Weight: 234 lb 4.8 oz (106.278 kg)  SpO2: 100%    General: Well nourished. No acute distress.  HEENT: Normal oral mucosa. MMM.  Lungs: CTA bilaterally. Heart: tachycardia (100bpm). RRR; no extra sounds or murmurs  Abdomen: Non-distended, normal BS, soft, nontender; no hepatosplenomegaly  Extremities: No pedal edema. No joint swelling or tenderness.no muscle tenderness Neurologic: Normal EOM,  Alert and oriented x3. No obvious neurologic/cranial nerve deficits.  Assessment & Plan:  I have discussed with Dr Eppie Gibson   See problem based charting.

## 2014-10-01 NOTE — Assessment & Plan Note (Signed)
Assessment: His symptoms of increased daytime sleepiness is consistent with obstructive sleep apnea. STOP BANG score of 7 indicative of high risk for OSA. Plan: Referral for sleep study  Discussed wt loss through lifestyle modifications

## 2014-10-01 NOTE — Assessment & Plan Note (Addendum)
Lab Results  Component Value Date   HGBA1C 7.3 09/24/2014   HGBA1C 6.7 06/30/2014   HGBA1C 6.8 02/10/2014     Assessment: Diabetes control:   Progress toward A1C goal:    Comments: On Glipizide 5 mg daily  Plan: Medications:  cont with Glipizide 5 mg daily  Home glucose monitoring: Frequency:   Timing:   Instruction/counseling given: reminded to get eye exam, discussed foot care, discussed the need for weight loss and discussed diet Educational resources provided:   Self management tools provided:   Other plans:  Foot exam done today.  Referral to podiatry recommended.  Eye exam done today  Discussed wt loss Start Aspirin today

## 2014-10-01 NOTE — Assessment & Plan Note (Signed)
BP Readings from Last 3 Encounters:  10/01/14 167/91  09/24/14 113/76  06/30/14 192/99    Lab Results  Component Value Date   NA 135 09/24/2014   K 4.5 09/24/2014   CREATININE 1.20 09/24/2014    Assessment: Blood pressure control:   Progress toward BP goal:    Comments: off Benazepril-HCTZ 20-25mg  daily. He self-discontinued metoprolol about 5 months ago.  Plan: Medications: Start Coreg 3.125 mg twice a day.  Educational resources provided:   Self management tools provided:   Other plans: Follow-up in 2 weeks. Consider addition of an ACE inhibitor

## 2014-10-01 NOTE — Assessment & Plan Note (Addendum)
Assessment: Chest pain is somehow atypical. During his last visit, it was felt to be secondary to volume depletion in the setting thiazide therapy. He had accompanying muscle cramps under his ribs which have improved. However, he does report today an episode of exertional dyspnea with chest pain which lasted 5 minutes. Prior EKGs have been unremarkable. He has not followed up with cardiology recently. Plan -Start Coreg 3.125 mg twice a day. -Start aspirin 81 mg daily. -Recommended that he doesn't take Viagra. -Referral to cardiology -Aggressive treatment of his HTN. Will consider addition of ACE inhibitor if the Coreg does not improve his blood pressure. Patient will not be started on HCTZ given his recurrent cramps.

## 2014-10-01 NOTE — Assessment & Plan Note (Signed)
Given patient's significant past medical history of coronary artery disease, and his reported history of chest pains which have persisted since his last visit, I will refer to cardiology today. Rib cramps have improved though.  Last EKG 5/18 in the office did not reveal any significant change compared to her prior EKG on 10/02/2013.  He has been off Metoprolol due to noncompliance. He has not followed up with cardiology since 2014. Plan  Cardiology referral  Started on Coreg and aspirin today

## 2014-10-01 NOTE — Progress Notes (Signed)
Retinal images done and transmited today.

## 2014-10-02 NOTE — Progress Notes (Signed)
Case discussed with Dr. Kazibwe soon after the resident saw the patient.  We reviewed the resident's history and exam and pertinent patient test results.  I agree with the assessment, diagnosis, and plan of care documented in the resident's note. 

## 2014-10-07 ENCOUNTER — Encounter: Payer: Self-pay | Admitting: Dietician

## 2014-10-08 NOTE — Addendum Note (Signed)
Addended by: Dow AdolphKAZIBWE, Grae Cannata on: 10/08/2014 08:25 AM   Modules accepted: Orders

## 2014-10-08 NOTE — Addendum Note (Signed)
Addended by: Neomia DearPOWERS, Ginnifer Creelman E on: 10/08/2014 08:03 AM   Modules accepted: Orders

## 2014-10-16 ENCOUNTER — Encounter: Payer: Self-pay | Admitting: *Deleted

## 2014-11-07 ENCOUNTER — Encounter: Payer: Self-pay | Admitting: Internal Medicine

## 2014-11-07 ENCOUNTER — Ambulatory Visit (INDEPENDENT_AMBULATORY_CARE_PROVIDER_SITE_OTHER): Payer: Medicare Other | Admitting: Internal Medicine

## 2014-11-07 VITALS — BP 148/92 | HR 102 | Ht 67.0 in | Wt 237.1 lb

## 2014-11-07 DIAGNOSIS — R079 Chest pain, unspecified: Secondary | ICD-10-CM | POA: Diagnosis not present

## 2014-11-07 MED ORDER — CARVEDILOL 6.25 MG PO TABS: 6.2500 mg | ORAL_TABLET | Freq: Two times a day (BID) | ORAL | Status: DC

## 2014-11-07 MED ORDER — METOPROLOL TARTRATE 25 MG PO TABS: 25.0000 mg | ORAL_TABLET | Freq: Two times a day (BID) | ORAL | Status: DC

## 2014-11-07 NOTE — Progress Notes (Signed)
Cardiology Office Note   Date:  11/07/2014   ID:  Daniel Perkins, DOB 1955/07/28, MRN [id number]  PCP:  Jessee Avers, MD  Cardiologist:   Dorris Carnes, MD   No chief complaint on file.     History of Present Illness: Daniel Perkins is a 59 y.o. male with a history of DM, HTN, CAD, statin intolerance   He was seen by  Estevan Ryder in the past  Hx of CP  SHe underwent cardiac cath in 2011  This showed mild nonobstructive CAD  Echo showed normal LV systolic function and diastolic dysfunction  3532 stress myview was normal.   Last seen in cardiology in 2014    Pt gets sob  Sometimes with walking  Will get stinging in chest  Then rushing through chst  Associated with SOB   Dizzy  Last about 40 sec then goes away.      Current Outpatient Prescriptions  Medication Sig Dispense Refill  . allopurinol (ZYLOPRIM) 100 MG tablet Take 1 tablet (100 mg total) by mouth daily. 30 tablet 2  . aspirin EC 81 MG tablet Take 1 tablet (81 mg total) by mouth daily. 30 tablet 3  . Blood Glucose Monitoring Suppl (ONE TOUCH ULTRA 2) W/DEVICE KIT Check blood sugar twice daily as instructed dx code 250.00 1 each 0  . carvedilol (COREG) 3.125 MG tablet Take 1 tablet (3.125 mg total) by mouth 2 (two) times daily with a meal. 60 tablet 2  . colchicine 0.6 MG tablet Take 1 tablet (0.6 mg total) by mouth daily. 30 tablet 2  . glipiZIDE (GLUCOTROL XL) 5 MG 24 hr tablet Take 1 tablet (5 mg total) by mouth daily with breakfast. 30 tablet 1  . glucose blood (ONE TOUCH ULTRA TEST) test strip Check blood sugar twice daily as instructed dx code 250.00 100 each 5  . Lancet Devices MISC 1 kit by Does not apply route 3 (three) times daily. 100 each 11  . ONETOUCH DELICA LANCETS FINE MISC Check blood sugar twice daily as instructed dx code 250.00 100 each 12   No current facility-administered medications for this visit.    Allergies:   Morphine and Pravastatin sodium   Past Medical History  Diagnosis Date  .  Coronary artery disease     Holter monitor 02/2012 - sinus with rare PVC  . Gout   . Hyperlipidemia   . Hypertension   . Osteomyelitis   . CTEV (congenital talipes equinovarus)   . Myocardial infarction ~ 2007    "mild"  . Type II diabetes mellitus   . Club foot of both lower extremities 1958  . Pneumonia 2000's    "couple times"    Past Surgical History  Procedure Laterality Date  . Anterior cervical decomp/discectomy fusion  X 2  . Foot surgery Left x16    "joints collapsed; keep getting infected"  . Posterior cervical fusion/foraminotomy  X 2  . Foot fracture surgery Right 1990's    "pt a steel plate in"  . Cardiac catheterization       Social History:  The patient  reports that he quit smoking about 7 years ago. His smoking use included Cigarettes. He has a 32 pack-year smoking history. He has never used smokeless tobacco. He reports that he drinks alcohol. He reports that he does not use illicit drugs.   Family History:  The patient's family history includes Coronary artery disease in his mother; Diabetes in his mother; Heart attack in his brother; Heart  disease in his mother.    ROS:  Please see the history of present illness. All other systems are reviewed and  Negative to the above problem except as noted.    PHYSICAL EXAM: VS:  BP 148/92 mmHg  Pulse 102  Ht '5\' 7"'  (1.702 m)  Wt 237 lb 1.9 oz (107.557 kg)  BMI 37.13 kg/m2  GEN: Well nourished, well developed, in no acute distress HEENT: normal Neck: no JVD, carotid bruits, or masses Cardiac: RRR; no murmurs, rubs, or gallops,no edema  Respiratory:  clear to auscultation bilaterally, normal work of breathing GI: soft, nontender, nondistended, + BS  No hepatomegaly  MS: no deformity Moving all extremities   Skin: warm and dry, no rash Neuro:  Strength and sensation are intact Psych: euthymic mood, full affect   EKG:  EKG is nor ordered today.   Lipid Panel    Component Value Date/Time   CHOL 170  06/30/2014 1634   TRIG 225* 06/30/2014 1634   HDL 28* 06/30/2014 1634   CHOLHDL 6.1 06/30/2014 1634   VLDL 45* 06/30/2014 1634   LDLCALC 97 06/30/2014 1634      Wt Readings from Last 3 Encounters:  11/07/14 237 lb 1.9 oz (107.557 kg)  10/01/14 234 lb 4.8 oz (106.278 kg)  09/24/14 236 lb 3.2 oz (107.14 kg)      ASSESSMENT AND PLAN:  1.  CP/SOB  SOmewhat atypical but concrning  Will set up for lexiscan myview to evaluate for ischemia  2.  HTn  Increase coreg to 6.25 bid  F/U in 2 months  3.  Lipisa  QIl nwws ro d/u qirh lipisa  Ahouls vw on arRIN.      Disposition:   FU with me in 8 wks    Signed, Dorris Carnes, MD  11/07/2014 4:34 PM    [facility] [place], [place], [place]  [address] Phone: [phone]; Fax: [phone]

## 2014-11-07 NOTE — Patient Instructions (Addendum)
Your physician has recommended you make the following change in your medication:  1.) INCREASE CARVEDILOL (COREG) TO 6.25 MG TWICE DAILY  Your physician has requested that you have a lexiscan myoview. For further information please visit [email or web address]. Please follow instruction sheet, as given.  Your physician recommends that you schedule a follow-up appointment in: 8 WEEKS WITH DR ROSS.

## 2014-11-13 NOTE — Addendum Note (Signed)
Addended by: Neomia DearPOWERS, Javante Nilsson E on: 11/13/2014 08:15 PM   Modules accepted: Orders

## 2014-11-19 ENCOUNTER — Telehealth (HOSPITAL_COMMUNITY): Payer: Self-pay | Admitting: *Deleted

## 2014-11-19 NOTE — Telephone Encounter (Signed)
Attempted to call patient regarding upcoming appointment- no answer. Daniel Perkins Kleo Dungee, RN 

## 2014-11-20 ENCOUNTER — Telehealth (HOSPITAL_COMMUNITY): Payer: Self-pay | Admitting: *Deleted

## 2014-11-20 NOTE — Telephone Encounter (Signed)
Attempted to call patient regarding upcoming appointment- no answer. Kazumi Lachney J Cristian Davitt, RN 

## 2014-11-24 ENCOUNTER — Ambulatory Visit (HOSPITAL_COMMUNITY): Payer: Medicare Other | Attending: Internal Medicine

## 2014-11-24 DIAGNOSIS — R0609 Other forms of dyspnea: Secondary | ICD-10-CM | POA: Diagnosis not present

## 2014-11-24 DIAGNOSIS — R42 Dizziness and giddiness: Secondary | ICD-10-CM | POA: Diagnosis not present

## 2014-11-24 DIAGNOSIS — R079 Chest pain, unspecified: Secondary | ICD-10-CM | POA: Diagnosis not present

## 2014-11-24 DIAGNOSIS — E119 Type 2 diabetes mellitus without complications: Secondary | ICD-10-CM | POA: Insufficient documentation

## 2014-11-24 DIAGNOSIS — R9439 Abnormal result of other cardiovascular function study: Secondary | ICD-10-CM | POA: Insufficient documentation

## 2014-11-24 DIAGNOSIS — I10 Essential (primary) hypertension: Secondary | ICD-10-CM | POA: Insufficient documentation

## 2014-11-24 LAB — MYOCARDIAL PERFUSION IMAGING
CHL CUP NUCLEAR SDS: 2
CSEPPHR: 107 {beats}/min
LHR: 0.28
LV dias vol: 93 mL
LV sys vol: 35 mL
NUC STRESS TID: 0.92
Rest HR: 81 {beats}/min
SRS: 3
SSS: 5

## 2014-11-24 MED ORDER — TECHNETIUM TC 99M SESTAMIBI GENERIC - CARDIOLITE
10.9000 | Freq: Once | INTRAVENOUS | Status: AC | PRN
  Administered 2014-11-24: 10.9 via INTRAVENOUS

## 2014-11-24 MED ORDER — TECHNETIUM TC 99M SESTAMIBI GENERIC - CARDIOLITE
32.9000 | Freq: Once | INTRAVENOUS | Status: AC | PRN
  Administered 2014-11-24: 32.9 via INTRAVENOUS

## 2014-11-24 MED ORDER — REGADENOSON 0.4 MG/5ML IV SOLN
0.4000 mg | Freq: Once | INTRAVENOUS | Status: AC
  Administered 2014-11-24: 0.4 mg via INTRAVENOUS

## 2014-11-27 ENCOUNTER — Telehealth: Payer: Self-pay | Admitting: Internal Medicine

## 2014-11-27 NOTE — Telephone Encounter (Signed)
Follow Up        Pt returning [facility] phone call.

## 2014-12-25 ENCOUNTER — Emergency Department (HOSPITAL_COMMUNITY)
Admission: EM | Admit: 2014-12-25 | Discharge: 2014-12-26 | Disposition: A | Discharge status: Home / Self Care | Payer: Medicare Other | Attending: Emergency Medicine | Admitting: Emergency Medicine

## 2014-12-25 ENCOUNTER — Emergency Department (HOSPITAL_COMMUNITY): Payer: Medicare Other

## 2014-12-25 ENCOUNTER — Ambulatory Visit (HOSPITAL_BASED_OUTPATIENT_CLINIC_OR_DEPARTMENT_OTHER): Payer: Medicare Other | Attending: Internal Medicine

## 2014-12-25 ENCOUNTER — Encounter (HOSPITAL_COMMUNITY): Payer: Self-pay | Admitting: Emergency Medicine

## 2014-12-25 DIAGNOSIS — Z79899 Other long term (current) drug therapy: Secondary | ICD-10-CM | POA: Insufficient documentation

## 2014-12-25 DIAGNOSIS — Z87891 Personal history of nicotine dependence: Secondary | ICD-10-CM | POA: Insufficient documentation

## 2014-12-25 DIAGNOSIS — M79672 Pain in left foot: Secondary | ICD-10-CM | POA: Diagnosis not present

## 2014-12-25 DIAGNOSIS — Z9889 Other specified postprocedural states: Secondary | ICD-10-CM | POA: Insufficient documentation

## 2014-12-25 DIAGNOSIS — Z8701 Personal history of pneumonia (recurrent): Secondary | ICD-10-CM | POA: Insufficient documentation

## 2014-12-25 DIAGNOSIS — I2510 Atherosclerotic heart disease of native coronary artery without angina pectoris: Secondary | ICD-10-CM | POA: Insufficient documentation

## 2014-12-25 DIAGNOSIS — Q798 Other congenital malformations of musculoskeletal system: Secondary | ICD-10-CM | POA: Diagnosis not present

## 2014-12-25 DIAGNOSIS — Z8776 Personal history of (corrected) congenital malformations of integument, limbs and musculoskeletal system: Secondary | ICD-10-CM | POA: Diagnosis not present

## 2014-12-25 DIAGNOSIS — R52 Pain, unspecified: Secondary | ICD-10-CM

## 2014-12-25 DIAGNOSIS — I252 Old myocardial infarction: Secondary | ICD-10-CM | POA: Diagnosis not present

## 2014-12-25 DIAGNOSIS — E119 Type 2 diabetes mellitus without complications: Secondary | ICD-10-CM | POA: Diagnosis not present

## 2014-12-25 DIAGNOSIS — M109 Gout, unspecified: Secondary | ICD-10-CM | POA: Diagnosis not present

## 2014-12-25 DIAGNOSIS — I10 Essential (primary) hypertension: Secondary | ICD-10-CM | POA: Diagnosis not present

## 2014-12-25 DIAGNOSIS — M2550 Pain in unspecified joint: Secondary | ICD-10-CM

## 2014-12-25 DIAGNOSIS — M2142 Flat foot [pes planus] (acquired), left foot: Secondary | ICD-10-CM | POA: Diagnosis not present

## 2014-12-25 LAB — BASIC METABOLIC PANEL
Anion gap: 8 (ref 5–15)
BUN: 15 mg/dL (ref 6–20)
CHLORIDE: 105 mmol/L (ref 101–111)
CO2: 24 mmol/L (ref 22–32)
CREATININE: 0.92 mg/dL (ref 0.61–1.24)
Calcium: 9.2 mg/dL (ref 8.9–10.3)
GFR calc Af Amer: >60 mL/min (ref >60)
GFR calc non Af Amer: >60 mL/min (ref >60)
Glucose, Bld: 178 mg/dL — ABNORMAL HIGH (ref 65–99)
Potassium: 4.5 mmol/L (ref 3.5–5.1)
Sodium: 137 mmol/L (ref 135–145)

## 2014-12-25 LAB — CBC WITH DIFFERENTIAL/PLATELET
BASOS PCT: 0 % (ref 0–1)
Basophils Absolute: 0 10*3/uL (ref 0.0–0.1)
EOS PCT: 3 % (ref 0–5)
Eosinophils Absolute: 0.3 10*3/uL (ref 0.0–0.7)
HEMATOCRIT: 37.5 % — AB (ref 39.0–52.0)
HEMOGLOBIN: 12.1 g/dL — AB (ref 13.0–17.0)
LYMPHS PCT: 30 % (ref 12–46)
Lymphs Abs: 2.6 10*3/uL (ref 0.7–4.0)
MCH: 22.5 pg — AB (ref 26.0–34.0)
MCHC: 32.3 g/dL (ref 30.0–36.0)
MCV: 69.8 fL — AB (ref 78.0–100.0)
MONOS PCT: 13 % — AB (ref 3–12)
Monocytes Absolute: 1.1 10*3/uL — ABNORMAL HIGH (ref 0.1–1.0)
NEUTROS ABS: 4.6 10*3/uL (ref 1.7–7.7)
NEUTROS PCT: 54 % (ref 43–77)
Platelets: 305 10*3/uL (ref 150–400)
RBC: 5.37 MIL/uL (ref 4.22–5.81)
RDW: 15 % (ref 11.5–15.5)
WBC: 8.6 10*3/uL (ref 4.0–10.5)

## 2014-12-25 MED ORDER — OXYCODONE-ACETAMINOPHEN 5-325 MG PO TABS
2.0000 | ORAL_TABLET | Freq: Once | ORAL | Status: DC
  Filled 2014-12-25: qty 2

## 2014-12-25 MED ORDER — NAPROXEN 500 MG PO TABS: 500.0000 mg | ORAL_TABLET | Freq: Two times a day (BID) | ORAL | Status: DC

## 2014-12-25 MED ORDER — OXYCODONE-ACETAMINOPHEN 5-325 MG PO TABS: 1.0000 | ORAL_TABLET | Freq: Four times a day (QID) | ORAL | Status: DC | PRN

## 2014-12-25 NOTE — ED Provider Notes (Signed)
CSN: [id number]     Arrival date & time 12/25/14  2005 History  First MD Initiated Contact with Patient 12/25/14 2152     Chief Complaint  Patient presents with  . Foot Pain   HPI Patient presents to the emergency room with complaints of left foot pain for the last week. Patient has a history of congenital club foot. He eventually had issues with collapse of the bones in his foot requiring several surgeries. Patient has seen Dr. Sharol Given in the past. Patient called the office but cannot get into the office until next week.  Came to the emergency room because of the increasing pain. He has not noticed any redness. He denies any fevers. Has noticed intermittent swelling. It gets worse whenever he tries to get up and stand. Past Medical History  Diagnosis Date  . Coronary artery disease     Holter monitor 02/2012 - sinus with rare PVC  . Gout   . Hyperlipidemia   . Hypertension   . Osteomyelitis   . CTEV (congenital talipes equinovarus)   . Myocardial infarction ~ 2007    "mild"  . Type II diabetes mellitus   . Club foot of both lower extremities 1958  . Pneumonia 2000's    "couple times"   Past Surgical History  Procedure Laterality Date  . Anterior cervical decomp/discectomy fusion  X 2  . Foot surgery Left x16    "joints collapsed; keep getting infected"  . Posterior cervical fusion/foraminotomy  X 2  . Foot fracture surgery Right 1990's    "pt a steel plate in"  . Cardiac catheterization     Family History  Problem Relation Age of Onset  . Diabetes Mother   . Coronary artery disease Mother     HAS PACEMAKER  . Heart disease Mother   . Heart attack Brother    Social History  Substance Use Topics  . Smoking status: Former Smoker -- 1.00 packs/day for 32 years    Types: Cigarettes    Quit date: 07/06/2007  . Smokeless tobacco: Never Used  . Alcohol Use: Yes     Comment: "usually drink one small can beer/year; birthday or superbowl"    Review of Systems  All other  systems reviewed and are negative.     Allergies  Morphine and Pravastatin sodium  Home Medications   Prior to Admission medications   Medication Sig Start Date End Date Taking? Authorizing Provider  Blood Glucose Monitoring Suppl (ONE TOUCH ULTRA 2) W/DEVICE KIT Check blood sugar twice daily as instructed dx code 250.00 07/10/13  Yes Jessee Avers, MD  carvedilol (COREG) 3.125 MG tablet Take 1 tablet by mouth 2 (two) times daily. 11/21/14  Yes Historical Provider, MD  colchicine 0.6 MG tablet Take 1 tablet (0.6 mg total) by mouth daily. 06/30/14 06/30/15 Yes Jerrye Noble, MD  glipiZIDE (GLUCOTROL XL) 5 MG 24 hr tablet Take 1 tablet (5 mg total) by mouth daily with breakfast. 10/01/14  Yes Jessee Avers, MD  glucose blood (ONE TOUCH ULTRA TEST) test strip Check blood sugar twice daily as instructed dx code 250.00 02/10/14  Yes Jessee Avers, MD  Lancet Devices MISC 1 kit by Does not apply route 3 (three) times daily. 06/29/12  Yes Jessee Avers, MD  [facility] DELICA LANCETS FINE MISC Check blood sugar twice daily as instructed dx code 250.00 07/10/13  Yes Jessee Avers, MD  allopurinol (ZYLOPRIM) 100 MG tablet Take 1 tablet (100 mg total) by mouth daily. Patient not taking: Reported  on 12/25/2014 02/10/14   Jessee Avers, MD  aspirin EC 81 MG tablet Take 1 tablet (81 mg total) by mouth daily. Patient not taking: Reported on 12/25/2014 10/01/14   Jessee Avers, MD  carvedilol (COREG) 6.25 MG tablet Take 1 tablet (6.25 mg total) by mouth 2 (two) times daily. Patient not taking: Reported on 12/25/2014 11/07/14   Fay Records, MD  naproxen (NAPROSYN) 500 MG tablet Take 1 tablet (500 mg total) by mouth 2 (two) times daily. 12/25/14   Dorie Rank, MD  oxyCODONE-acetaminophen (PERCOCET/ROXICET) 5-325 MG per tablet Take 1-2 tablets by mouth every 6 (six) hours as needed. 12/25/14   Dorie Rank, MD   BP 143/95 mmHg  Pulse 99  Temp(Src) 97.7 F (36.5 C) (Oral)  Resp 20  Ht $R'5\' 8"'sm$  (1.727 m)  Wt 237 lb  (107.502 kg)  BMI 36.04 kg/m2  SpO2 96% Physical Exam  Constitutional: He appears well-developed and well-nourished. No distress.  HENT:  Head: Normocephalic and atraumatic.  Right Ear: External ear normal.  Left Ear: External ear normal.  Eyes: Conjunctivae are normal. Right eye exhibits no discharge. Left eye exhibits no discharge. No scleral icterus.  Neck: Neck supple. No tracheal deviation present.  Cardiovascular: Normal rate, regular rhythm and normal heart sounds.   Pulmonary/Chest: Effort normal. No stridor. No respiratory distress.  Musculoskeletal: He exhibits edema and tenderness.       Left foot: There is tenderness and bony tenderness. There is no swelling and no deformity.  No erythema of the foot, no edema or erythema of the left calf, tenderness to palpation left mid foot  Neurological: He is alert. Cranial nerve deficit: no gross deficits.  Skin: Skin is warm and dry. No rash noted.  Psychiatric: He has a normal mood and affect.  Nursing note and vitals reviewed.   ED Course  Procedures (including critical care time) Labs Review Labs Reviewed  CBC WITH DIFFERENTIAL/PLATELET - Abnormal; Notable for the following:    Hemoglobin 12.1 (*)    HCT 37.5 (*)    MCV 69.8 (*)    MCH 22.5 (*)    Monocytes Relative 13 (*)    Monocytes Absolute 1.1 (*)    All other components within normal limits  BASIC METABOLIC PANEL - Abnormal; Notable for the following:    Glucose, Bld 178 (*)    All other components within normal limits    Imaging Review Dg Foot Complete Left  12/25/2014   CLINICAL DATA:  Left foot pain and swelling for the past week. Multiple foot surgeries.  EXAM: LEFT FOOT - COMPLETE 3+ VIEW  COMPARISON:  12/28/2008.  FINDINGS: Again demonstrated is flattening of the normal plantar arch. Also again demonstrated is severe deformity of the proximal foot with bone fusion currently demonstrated. The previously seen pellets are no longer visualized. Interval  degenerative changes at the articulation of the first metatarsal and medial cuneiform. Diffuse osteopenia. Diffuse soft tissue swelling. Arterial calcifications.  IMPRESSION: 1. 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 2. Diffuse soft tissue swelling.   Electronically Signed   By: Claudie Revering M.D.   On: 12/25/2014 22:58    MDM   Final diagnoses:  Arthralgia   Patient has history of multiple prior foot surgeries.  He does have soft tissue swelling but no redness to suggest infection.  Labs are also reassuring.  Suspect more of an inflammatory condition.  Dc home with pain meds.  Follow up with Dr Stephania Fragmin, MD  12/25/14 2356 

## 2014-12-25 NOTE — Discharge Instructions (Signed)

## 2014-12-25 NOTE — ED Notes (Signed)
Pt arrived to the ED with a complaint of left foot pain.  Pt states he has had several surgerieson the foot.  Pt states foot has been swollen for a week.  Pt called Dr Lajoyce Corners but is unable to get an appointment for a week.

## 2014-12-31 DIAGNOSIS — M79671 Pain in right foot: Secondary | ICD-10-CM | POA: Diagnosis not present

## 2015-01-15 ENCOUNTER — Ambulatory Visit: Payer: Medicare Other | Admitting: Internal Medicine

## 2015-01-19 ENCOUNTER — Encounter: Payer: Self-pay | Admitting: Internal Medicine

## 2015-01-21 ENCOUNTER — Encounter: Payer: Self-pay | Admitting: Internal Medicine

## 2015-01-22 ENCOUNTER — Other Ambulatory Visit: Payer: Self-pay | Admitting: Internal Medicine

## 2015-01-22 DIAGNOSIS — E119 Type 2 diabetes mellitus without complications: Secondary | ICD-10-CM

## 2015-01-22 NOTE — Telephone Encounter (Signed)
Pt called requesting glipizide to be filled @ [facility].

## 2015-01-23 ENCOUNTER — Ambulatory Visit: Payer: Medicare Other | Admitting: Internal Medicine

## 2015-01-26 MED ORDER — GLIPIZIDE ER 5 MG PO TB24
5.0000 mg | ORAL_TABLET | Freq: Every day | ORAL | Status: DC
Start: 2015-01-26 — End: 2015-03-16

## 2015-01-27 NOTE — Telephone Encounter (Signed)
Called to pharm 

## 2015-02-04 ENCOUNTER — Telehealth: Payer: Self-pay | Admitting: Pharmacist

## 2015-02-04 NOTE — Telephone Encounter (Signed)
Contacted patient to offer assistance with medications. Unable to reach.

## 2015-03-13 ENCOUNTER — Ambulatory Visit (HOSPITAL_BASED_OUTPATIENT_CLINIC_OR_DEPARTMENT_OTHER): Payer: Medicare Other | Attending: Internal Medicine

## 2015-03-16 ENCOUNTER — Other Ambulatory Visit: Payer: Self-pay

## 2015-03-16 DIAGNOSIS — M25561 Pain in right knee: Secondary | ICD-10-CM

## 2015-03-16 DIAGNOSIS — M109 Gout, unspecified: Secondary | ICD-10-CM

## 2015-03-16 DIAGNOSIS — E119 Type 2 diabetes mellitus without complications: Secondary | ICD-10-CM

## 2015-03-16 MED ORDER — GLIPIZIDE ER 5 MG PO TB24
5.0000 mg | ORAL_TABLET | Freq: Every day | ORAL | Status: DC
Start: 2015-03-16 — End: 2015-03-19

## 2015-03-16 MED ORDER — CARVEDILOL 6.25 MG PO TABS: 6.2500 mg | ORAL_TABLET | Freq: Two times a day (BID) | ORAL | Status: DC

## 2015-03-16 MED ORDER — COLCHICINE 0.6 MG PO TABS: 0.6000 mg | ORAL_TABLET | ORAL | Status: DC | PRN

## 2015-03-16 MED ORDER — ALLOPURINOL 100 MG PO TABS: 100.0000 mg | ORAL_TABLET | Freq: Every day | ORAL | Status: DC

## 2015-03-16 NOTE — Telephone Encounter (Signed)
Patient requesting refill on his medications, hasn't been in clinic, scheduled to be seen by PCP 11/10 at 3:15. Also, pt not sure of dosage on coreg.

## 2015-03-16 NOTE — Telephone Encounter (Signed)
Reviewed chart.Coreg to be 6.25 (Dr Tenny Crawoss increased dose). Colchicine to be PRN for gout flare only - not QD

## 2015-03-19 ENCOUNTER — Encounter: Payer: Self-pay | Admitting: Internal Medicine

## 2015-03-19 ENCOUNTER — Ambulatory Visit (INDEPENDENT_AMBULATORY_CARE_PROVIDER_SITE_OTHER): Payer: Medicare Other | Admitting: Internal Medicine

## 2015-03-19 VITALS — BP 136/86 | HR 97 | Temp 98.5°F | Ht 67.0 in | Wt 237.5 lb

## 2015-03-19 DIAGNOSIS — I10 Essential (primary) hypertension: Secondary | ICD-10-CM

## 2015-03-19 DIAGNOSIS — R058 Other specified cough: Secondary | ICD-10-CM | POA: Insufficient documentation

## 2015-03-19 DIAGNOSIS — Z0000 Encounter for general adult medical examination without abnormal findings: Secondary | ICD-10-CM

## 2015-03-19 DIAGNOSIS — Z7984 Long term (current) use of oral hypoglycemic drugs: Secondary | ICD-10-CM | POA: Diagnosis not present

## 2015-03-19 DIAGNOSIS — Z23 Encounter for immunization: Secondary | ICD-10-CM

## 2015-03-19 DIAGNOSIS — R05 Cough: Secondary | ICD-10-CM

## 2015-03-19 DIAGNOSIS — E119 Type 2 diabetes mellitus without complications: Secondary | ICD-10-CM

## 2015-03-19 DIAGNOSIS — M109 Gout, unspecified: Secondary | ICD-10-CM

## 2015-03-19 LAB — GLUCOSE, CAPILLARY: Glucose-Capillary: 175 mg/dL — ABNORMAL HIGH (ref 65–99)

## 2015-03-19 LAB — POCT GLYCOSYLATED HEMOGLOBIN (HGB A1C): Hemoglobin A1C: 9.2

## 2015-03-19 MED ORDER — GLIPIZIDE ER 5 MG PO TB24: 5.0000 mg | ORAL_TABLET | Freq: Every day | ORAL | Status: DC

## 2015-03-19 MED ORDER — ALLOPURINOL 100 MG PO TABS: 100.0000 mg | ORAL_TABLET | Freq: Every day | ORAL | Status: DC

## 2015-03-19 MED ORDER — GLUCOSE BLOOD VI STRP: ORAL_STRIP | Status: DC

## 2015-03-19 MED ORDER — CARVEDILOL 6.25 MG PO TABS: 6.2500 mg | ORAL_TABLET | Freq: Two times a day (BID) | ORAL | Status: DC

## 2015-03-19 MED ORDER — ASPIRIN EC 81 MG PO TBEC: 81.0000 mg | DELAYED_RELEASE_TABLET | Freq: Every day | ORAL | Status: DC

## 2015-03-19 MED ORDER — METFORMIN HCL 500 MG PO TABS: 500.0000 mg | ORAL_TABLET | Freq: Two times a day (BID) | ORAL | Status: DC

## 2015-03-19 NOTE — Progress Notes (Signed)
Patient ID: Daniel Perkins, male   DOB: [birth year], 59 y.o.   MRN: [id number]    Subjective:   Patient ID: Daniel Perkins male   DOB: [birth year] 59 y.o.   MRN: [id number]  HPI: [name] is a 59 y.o. man with T2Dm, HTN, and chronic cough here to discuss his problems. Other PMH noted below.  Please see Problem List/A&P for the status of the patient's chronic medical problems      Past Medical History  Diagnosis Date  . Coronary artery disease     Holter monitor 02/2012 - sinus with rare PVC  . Gout   . Hyperlipidemia   . Hypertension   . Osteomyelitis ([place])   . CTEV (congenital talipes equinovarus)   . Myocardial infarction [facility]) ~ 2007    "mild"  . Type II diabetes mellitus ([place])   . Club foot of both lower extremities 1958  . Pneumonia 2000's    "couple times"   Current Outpatient Prescriptions  Medication Sig Dispense Refill  . Blood Glucose Monitoring Suppl (ONE TOUCH ULTRA 2) W/DEVICE KIT Check blood sugar twice daily as instructed dx code 250.00 1 each 0  . carvedilol (COREG) 6.25 MG tablet Take 1 tablet (6.25 mg total) by mouth 2 (two) times daily. 180 tablet 3  . glipiZIDE (GLUCOTROL XL) 5 MG 24 hr tablet Take 1 tablet (5 mg total) by mouth daily with breakfast. 90 tablet 3  . glucose blood (ONE TOUCH ULTRA TEST) test strip Check blood sugar twice daily as instructed dx code 250.00 100 each 5  . Lancet Devices MISC 1 kit by Does not apply route 3 (three) times daily. 100 each 11  . ONETOUCH DELICA LANCETS FINE MISC Check blood sugar twice daily as instructed dx code 250.00 100 each 12  . allopurinol (ZYLOPRIM) 100 MG tablet Take 1 tablet (100 mg total) by mouth daily. 30 tablet 2  . aspirin EC 81 MG tablet Take 1 tablet (81 mg total) by mouth daily. 30 tablet 3  . colchicine 0.6 MG tablet Take 1 tablet (0.6 mg total) by mouth as needed. For gout flare (Patient not taking: Reported on 03/19/2015) 30 tablet 0   No current facility-administered  medications for this visit.   Family History  Problem Relation Age of Onset  . Diabetes Mother   . Coronary artery disease Mother     HAS PACEMAKER  . Heart disease Mother   . Heart attack Brother    Social History   Social History  . Marital Status: Legally Separated    Spouse Name: N/A  . Number of Children: N/A  . Years of Education: 13   Occupational History  .    . unemployed    Social History Main Topics  . Smoking status: Former Smoker -- 1.00 packs/day for 32 years    Types: Cigarettes    Quit date: 07/06/2007  . Smokeless tobacco: Never Used  . Alcohol Use: Yes     Comment: "usually drink one small can beer/year; birthday or superbowl"  . Drug Use: No  . Sexual Activity: Not Asked   Other Topics Concern  . None   Social History Narrative   Review of Systems: Review of Systems  Constitutional: Negative for fever, chills, weight loss and malaise/fatigue.  Respiratory: Positive for cough and sputum production. Negative for hemoptysis and shortness of breath.   Cardiovascular: Negative for chest pain, palpitations, claudication and leg swelling.  Gastrointestinal: Negative for nausea, vomiting, abdominal pain and diarrhea.  Neurological:  Negative for headaches.    Objective:  Physical Exam: Filed Vitals:   03/19/15 1521  BP: 136/86  Pulse: 97  Temp: 98.5 F (36.9 C)  TempSrc: Oral  Height: '5\' 7"'  (1.702 m)  Weight: 237 lb 8 oz (107.729 kg)  SpO2: 97%   Physical Exam  Constitutional: He appears well-developed and well-nourished.  HENT:  Head: Normocephalic and atraumatic.  Nose: Nose normal.  Mouth/Throat: Oropharynx is clear and moist.  Cardiovascular: Normal rate, regular rhythm and intact distal pulses.   No murmur heard. Pulmonary/Chest: Effort normal and breath sounds normal. No respiratory distress. He has no wheezes.  Abdominal: Soft. Bowel sounds are normal. He exhibits no distension. There is no tenderness.  Neurological: He is alert.      Assessment & Plan:   Please see problem based charting for assessment and plan

## 2015-03-19 NOTE — Assessment & Plan Note (Addendum)
Lab Results  Component Value Date   HGBA1C 9.2 03/19/2015   HGBA1C 7.3 09/24/2014   HGBA1C 6.7 06/30/2014    Pt A1c is consistently rising- He does not check cbg at home. Pt is on glipizide  Plan Add metformin 500 mg bid, his creatinine is 0.92 at last time so should be fine.  Refilled his supplies for meter Counseled on diet and lifestyle modification  RTC in 3 months

## 2015-03-19 NOTE — Assessment & Plan Note (Addendum)
BP Readings from Last 3 Encounters:  03/19/15 136/86  12/26/14 130/86  11/07/14 148/92   BP well controlled Continue current regiemen of coreg Holding off on adding lisinopril today as he has postinfectious reactive cough May switch to ACEi instead of coreg as it is not first line choice for the initial rx of HTn

## 2015-03-19 NOTE — Assessment & Plan Note (Signed)
Pt with productive cough > 3 weeks. Last month he had a URI. He denies systemic symptoms. Lungs are clear to auscultation. 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

## 2015-03-19 NOTE — Assessment & Plan Note (Signed)
Referred for colonoscopy Given flu shot  Encouraged pt to take aspirin 81 mg daily

## 2015-03-19 NOTE — Patient Instructions (Signed)
Thanks for your visit today Please take metformin twice a day Please do the colonoscopy- they will call and make the appt Please take the aspirin daily Please call back if your cough does not improve or if you have fevers, etc.

## 2015-03-20 NOTE — Progress Notes (Signed)
Internal Medicine Clinic Attending  I saw and evaluated the patient.  I personally confirmed the key portions of the history and exam documented by Dr. Saraiya and I reviewed pertinent patient test results.  The assessment, diagnosis, and plan were formulated together and I agree with the documentation in the resident's note.  

## 2015-04-21 ENCOUNTER — Emergency Department (HOSPITAL_COMMUNITY)
Admission: EM | Admit: 2015-04-21 | Discharge: 2015-04-21 | Disposition: A | Discharge status: Home / Self Care | Payer: Medicare Other

## 2015-04-21 ENCOUNTER — Encounter (HOSPITAL_COMMUNITY): Payer: Self-pay | Admitting: *Deleted

## 2015-04-21 ENCOUNTER — Emergency Department (HOSPITAL_COMMUNITY)
Admission: EM | Admit: 2015-04-21 | Discharge: 2015-04-21 | Disposition: A | Discharge status: Home / Self Care | Payer: Medicare Other | Attending: Emergency Medicine | Admitting: Emergency Medicine

## 2015-04-21 ENCOUNTER — Emergency Department (HOSPITAL_COMMUNITY): Payer: Medicare Other

## 2015-04-21 DIAGNOSIS — I252 Old myocardial infarction: Secondary | ICD-10-CM | POA: Diagnosis not present

## 2015-04-21 DIAGNOSIS — Z79899 Other long term (current) drug therapy: Secondary | ICD-10-CM | POA: Insufficient documentation

## 2015-04-21 DIAGNOSIS — R042 Hemoptysis: Secondary | ICD-10-CM | POA: Diagnosis present

## 2015-04-21 DIAGNOSIS — Z87891 Personal history of nicotine dependence: Secondary | ICD-10-CM | POA: Insufficient documentation

## 2015-04-21 DIAGNOSIS — M7989 Other specified soft tissue disorders: Secondary | ICD-10-CM | POA: Diagnosis not present

## 2015-04-21 DIAGNOSIS — J209 Acute bronchitis, unspecified: Secondary | ICD-10-CM | POA: Insufficient documentation

## 2015-04-21 DIAGNOSIS — M10072 Idiopathic gout, left ankle and foot: Secondary | ICD-10-CM | POA: Insufficient documentation

## 2015-04-21 DIAGNOSIS — Z8776 Personal history of (corrected) congenital malformations of integument, limbs and musculoskeletal system: Secondary | ICD-10-CM | POA: Diagnosis not present

## 2015-04-21 DIAGNOSIS — Z9889 Other specified postprocedural states: Secondary | ICD-10-CM | POA: Diagnosis not present

## 2015-04-21 DIAGNOSIS — M109 Gout, unspecified: Secondary | ICD-10-CM | POA: Diagnosis not present

## 2015-04-21 DIAGNOSIS — Z7982 Long term (current) use of aspirin: Secondary | ICD-10-CM | POA: Diagnosis not present

## 2015-04-21 DIAGNOSIS — E119 Type 2 diabetes mellitus without complications: Secondary | ICD-10-CM | POA: Diagnosis not present

## 2015-04-21 DIAGNOSIS — I10 Essential (primary) hypertension: Secondary | ICD-10-CM | POA: Diagnosis not present

## 2015-04-21 DIAGNOSIS — Z8701 Personal history of pneumonia (recurrent): Secondary | ICD-10-CM | POA: Insufficient documentation

## 2015-04-21 DIAGNOSIS — F17210 Nicotine dependence, cigarettes, uncomplicated: Secondary | ICD-10-CM | POA: Diagnosis not present

## 2015-04-21 DIAGNOSIS — I2510 Atherosclerotic heart disease of native coronary artery without angina pectoris: Secondary | ICD-10-CM | POA: Diagnosis not present

## 2015-04-21 DIAGNOSIS — R05 Cough: Secondary | ICD-10-CM | POA: Diagnosis not present

## 2015-04-21 DIAGNOSIS — J40 Bronchitis, not specified as acute or chronic: Secondary | ICD-10-CM

## 2015-04-21 DIAGNOSIS — M25561 Pain in right knee: Secondary | ICD-10-CM

## 2015-04-21 MED ORDER — COLCHICINE 0.6 MG PO TABS
1.2000 mg | ORAL_TABLET | Freq: Once | ORAL | Status: AC
  Administered 2015-04-21: 1.2 mg via ORAL
  Filled 2015-04-21: qty 2

## 2015-04-21 MED ORDER — AZITHROMYCIN 250 MG PO TABS: 250.0000 mg | ORAL_TABLET | Freq: Every day | ORAL | Status: DC

## 2015-04-21 NOTE — ED Notes (Signed)
Pt complains of foot pain and swelling for the past week; pain is worse after walking. Pt states he has hx of gout; but his gout pain usually only lasts 2 days. Pt states he is taking his gout medication. Pt also complains of cough for the past 2 months. Pt states the mucus is yellow, sometimes mixed with blood.

## 2015-04-21 NOTE — ED Notes (Signed)
Pt came to nurse first to advise he doesn't want to wait.

## 2015-04-21 NOTE — Discharge Instructions (Signed)
Take 1 tablet of colchicine one hour after your dose here.  Follow up with your ortho.  Take 4 over the counter ibuprofen tablets 3 times a day or 2 over-the-counter naproxen tablets twice a day for pain.  Gout Gout is an inflammatory arthritis caused by a buildup of uric acid crystals in the joints. Uric acid is a chemical that is normally present in the blood. When the level of uric acid in the blood is too high it can form crystals that deposit in your joints and tissues. This causes joint redness, soreness, and swelling (inflammation). Repeat attacks are common. Over time, uric acid crystals can form into masses (tophi) near a joint, destroying bone and causing disfigurement. Gout is treatable and often preventable. CAUSES  The disease begins with elevated levels of uric acid in the blood. Uric acid is produced by your body when it breaks down a naturally found substance called purines. Certain foods you eat, such as meats and fish, contain high amounts of purines. Causes of an elevated uric acid level include:  Being passed down from parent to child (heredity).  Diseases that cause increased uric acid production (such as obesity, psoriasis, and certain cancers).  Excessive alcohol use.  Diet, especially diets rich in meat and seafood.  Medicines, including certain cancer-fighting medicines (chemotherapy), water pills (diuretics), and aspirin.  Chronic kidney disease. The kidneys are no longer able to remove uric acid well.  Problems with metabolism. Conditions strongly associated with gout include:  Obesity.  High blood pressure.  High cholesterol.  Diabetes. Not everyone with elevated uric acid levels gets gout. It is not understood why some people get gout and others do not. Surgery, joint injury, and eating too much of certain foods are some of the factors that can lead to gout attacks. SYMPTOMS   An attack of gout comes on quickly. It causes intense pain with redness,  swelling, and warmth in a joint.  Fever can occur.  Often, only one joint is involved. Certain joints are more commonly involved:  Base of the big toe.  Knee.  Ankle.  Wrist.  Finger. Without treatment, an attack usually goes away in a few days to weeks. Between attacks, you usually will not have symptoms, which is different from many other forms of arthritis. DIAGNOSIS  Your caregiver will suspect gout based on your symptoms and exam. In some cases, tests may be recommended. The tests may include:  Blood tests.  Urine tests.  X-rays.  Joint fluid exam. This exam requires a needle to remove fluid from the joint (arthrocentesis). Using a microscope, gout is confirmed when uric acid crystals are seen in the joint fluid. TREATMENT  There are two phases to gout treatment: treating the sudden onset (acute) attack and preventing attacks (prophylaxis).  Treatment of an Acute Attack.  Medicines are used. These include anti-inflammatory medicines or steroid medicines.  An injection of steroid medicine into the affected joint is sometimes necessary.  The painful joint is rested. Movement can worsen the arthritis.  You may use warm or cold treatments on painful joints, depending which works best for you.  Treatment to Prevent Attacks.  If you suffer from frequent gout attacks, your caregiver may advise preventive medicine. These medicines are started after the acute attack subsides. These medicines either help your kidneys eliminate uric acid from your body or decrease your uric acid production. You may need to stay on these medicines for a very long time.  The early phase of treatment with  preventive medicine can be associated with an increase in acute gout attacks. For this reason, during the first few months of treatment, your caregiver may also advise you to take medicines usually used for acute gout treatment. Be sure you understand your caregiver's directions. Your caregiver may  make several adjustments to your medicine dose before these medicines are effective.  Discuss dietary treatment with your caregiver or dietitian. Alcohol and drinks high in sugar and fructose and foods such as meat, poultry, and seafood can increase uric acid levels. Your caregiver or dietitian can advise you on drinks and foods that should be limited. HOME CARE INSTRUCTIONS   Do not take aspirin to relieve pain. This raises uric acid levels.  Only take over-the-counter or prescription medicines for pain, discomfort, or fever as directed by your caregiver.  Rest the joint as much as possible. When in bed, keep sheets and blankets off painful areas.  Keep the affected joint raised (elevated).  Apply warm or cold treatments to painful joints. Use of warm or cold treatments depends on which works best for you.  Use crutches if the painful joint is in your leg.  Drink enough fluids to keep your urine clear or pale yellow. This helps your body get rid of uric acid. Limit alcohol, sugary drinks, and fructose drinks.  Follow your dietary instructions. Pay careful attention to the amount of protein you eat. Your daily diet should emphasize fruits, vegetables, whole grains, and fat-free or low-fat milk products. Discuss the use of coffee, vitamin C, and cherries with your caregiver or dietitian. These may be helpful in lowering uric acid levels.  Maintain a healthy body weight. SEEK MEDICAL CARE IF:   You develop diarrhea, vomiting, or any side effects from medicines.  You do not feel better in 24 hours, or you are getting worse. SEEK IMMEDIATE MEDICAL CARE IF:   Your joint becomes suddenly more tender, and you have chills or a fever. MAKE SURE YOU:   Understand these instructions.  Will watch your condition.  Will get help right away if you are not doing well or get worse.   This information is not intended to replace advice given to you by your health care provider. Make sure you  discuss any questions you have with your health care provider.   Document Released: 04/22/2000 Document Revised: 05/16/2014 Document Reviewed: 12/07/2011 Elsevier Interactive Patient Education [employer].

## 2015-04-21 NOTE — ED Provider Notes (Signed)
CSN: [id number]     Arrival date & time 04/21/15  1220 History   First MD Initiated Contact with Patient 04/21/15 1308     Chief Complaint  Patient presents with  . Foot Pain  . Hemoptysis     (Consider location/radiation/quality/duration/timing/severity/associated sxs/prior Treatment) Patient is a 59 y.o. male presenting with lower extremity pain. The history is provided by the patient.  Foot Pain This is a recurrent problem. The current episode started 2 days ago. The problem occurs constantly. The problem has not changed since onset.Pertinent negatives include no chest pain, no abdominal pain, no headaches and no shortness of breath. The symptoms are aggravated by bending, walking, stress, twisting and standing. Nothing relieves the symptoms. He has tried nothing for the symptoms. The treatment provided no relief.   59 yo M with a chief complaints of left foot pain. This is a chronic issue for this patient. Feels like his prior gout flares. Has had pain in that same joint. Patient denies fevers or chills denies congestion. Patient also complaining about cough is been ongoing for the past 2 months. Patient states that sometimes it has a little bit of blood associated with it. Denies any shortness of breath or chest pain.  Past Medical History  Diagnosis Date  . Coronary artery disease     Holter monitor 02/2012 - sinus with rare PVC  . Gout   . Hyperlipidemia   . Hypertension   . Osteomyelitis ([place])   . CTEV (congenital talipes equinovarus)   . Myocardial infarction [facility]) ~ 2007    "mild"  . Type II diabetes mellitus ([place])   . Club foot of both lower extremities 1958  . Pneumonia 2000's    "couple times"   Past Surgical History  Procedure Laterality Date  . Anterior cervical decomp/discectomy fusion  X 2  . Foot surgery Left x16    "joints collapsed; keep getting infected"  . Posterior cervical fusion/foraminotomy  X 2  . Foot fracture surgery Right 1990's    "pt a steel  plate in"  . Cardiac catheterization     Family History  Problem Relation Age of Onset  . Diabetes Mother   . Coronary artery disease Mother     HAS PACEMAKER  . Heart disease Mother   . Heart attack Brother    Social History  Substance Use Topics  . Smoking status: Former Smoker -- 1.00 packs/day for 32 years    Types: Cigarettes    Quit date: 07/06/2007  . Smokeless tobacco: Never Used  . Alcohol Use: Yes     Comment: "usually drink one small can beer/year; birthday or superbowl"    Review of Systems  Constitutional: Negative for fever and chills.  HENT: Negative for congestion and facial swelling.   Eyes: Negative for discharge and visual disturbance.  Respiratory: Negative for shortness of breath.   Cardiovascular: Negative for chest pain and palpitations.  Gastrointestinal: Negative for vomiting, abdominal pain and diarrhea.  Musculoskeletal: Positive for myalgias and arthralgias.  Skin: Negative for color change and rash.  Neurological: Negative for tremors, syncope and headaches.  Psychiatric/Behavioral: Negative for confusion and dysphoric mood.      Allergies  Morphine and Pravastatin sodium  Home Medications   Prior to Admission medications   Medication Sig Start Date End Date Taking? Authorizing Provider  allopurinol (ZYLOPRIM) 100 MG tablet Take 1 tablet (100 mg total) by mouth daily. 03/19/15  Yes Burgess Estelle, MD  carvedilol (COREG) 6.25 MG tablet Take 1 tablet (  6.25 mg total) by mouth 2 (two) times daily. 03/19/15  Yes Burgess Estelle, MD  colchicine 0.6 MG tablet Take 1 tablet (0.6 mg total) by mouth as needed. For gout flare 03/16/15 03/15/16 Yes Bartholomew Crews, MD  diphenhydramine-acetaminophen (TYLENOL PM) 25-500 MG TABS tablet Take 1-2 tablets by mouth 2 (two) times daily as needed (pain).   Yes Historical Provider, MD  glipiZIDE (GLUCOTROL XL) 5 MG 24 hr tablet Take 1 tablet (5 mg total) by mouth daily with breakfast. 03/19/15  Yes Burgess Estelle,  MD  aspirin EC 81 MG tablet Take 1 tablet (81 mg total) by mouth daily. 03/19/15   Burgess Estelle, MD  azithromycin (ZITHROMAX) 250 MG tablet Take 1 tablet (250 mg total) by mouth daily. Take first 2 tablets together, then 1 every day until finished. 04/21/15   Deno Etienne, DO  Blood Glucose Monitoring Suppl (ONE TOUCH ULTRA 2) W/DEVICE KIT Check blood sugar twice daily as instructed dx code 250.00 07/10/13   Jessee Avers, MD  glucose blood (ONE TOUCH ULTRA TEST) test strip Check blood sugar twice daily as instructed dx code 250.00 03/19/15   Burgess Estelle, MD  Lancet Devices MISC 1 kit by Does not apply route 3 (three) times daily. 06/29/12   Jessee Avers, MD  metFORMIN (GLUCOPHAGE) 500 MG tablet Take 1 tablet (500 mg total) by mouth 2 (two) times daily with a meal. 03/19/15 03/18/16  Burgess Estelle, MD  [facility] DELICA LANCETS FINE MISC Check blood sugar twice daily as instructed dx code 250.00 07/10/13   Jessee Avers, MD   BP 152/91 mmHg  Pulse 85  Temp(Src) 97.9 F (36.6 C) (Oral)  Resp 16  SpO2 97% Physical Exam  Constitutional: He is oriented to person, place, and time. He appears well-developed and well-nourished.  HENT:  Head: Normocephalic and atraumatic.  Eyes: EOM are normal. Pupils are equal, round, and reactive to light.  Neck: Normal range of motion. Neck supple. No JVD present.  Cardiovascular: Normal rate and regular rhythm.  Exam reveals no gallop and no friction rub.   No murmur heard. Pulmonary/Chest: No respiratory distress. He has no wheezes.  Abdominal: He exhibits no distension. There is no rebound and no guarding.  Musculoskeletal: Normal range of motion.  Neurological: He is alert and oriented to person, place, and time.  Skin: No rash noted. No pallor.  Psychiatric: He has a normal mood and affect. His behavior is normal.  Nursing note and vitals reviewed.   ED Course  Procedures (including critical care time) Labs Review Labs Reviewed - No data to  display  Imaging Review Dg Chest 2 View  04/21/2015  CLINICAL DATA:  Cough for 2 months, initial encounter EXAM: CHEST - 2 VIEW COMPARISON:  10/10/2013 FINDINGS: Cardiac shadow is within normal limits. The lungs are well aerated bilaterally. No focal infiltrate or sizable effusion is seen. No acute bony abnormality is noted. Postsurgical changes in the cervical spine are again noted. IMPRESSION: No active disease. Electronically Signed   By: Inez Catalina M.D.   On: 04/21/2015 14:05   Dg Foot Complete Left  04/21/2015  CLINICAL DATA:  Left foot pain swelling over the last week. Pain is worse after walking. EXAM: LEFT FOOT - COMPLETE 3+ VIEW COMPARISON:  Left foot radiographs. FINDINGS: Chronic fusion of the midfoot and hindfoot are stable. There are increased edematous changes about the foot. No acute or new focal osseous abnormality. IMPRESSION: 1. Stable chronic fusion of the midfoot without acute or new focal osseous  abnormality. 2. Mild increased soft tissue swelling. Electronically Signed   By: San Morelle M.D.   On: 04/21/2015 14:15   I have personally reviewed and evaluated these images and lab results as part of my medical decision-making.   EKG Interpretation None      MDM   Final diagnoses:  Acute gout of left foot, unspecified cause  Bronchitis    59 yo M with a chief complaint of a chronic cough as well as a gout flare. Also patient to stop his allopurinol while currently under flare. Will give him colchicine as patient states that is helped in the past. Cough consistent with bronchitis. Patient having no risk factors for TB or a PE. Will start on azithromycin for possible pertussis with prolonged cough. PCP and ortho follow-up.  4:05 PM:  I have discussed the diagnosis/risks/treatment options with the patient and family and believe the pt to be eligible for discharge home to follow-up with PCP/ortho. We also discussed returning to the ED immediately if new or worsening  sx occur. We discussed the sx which are most concerning (e.g., sudden worsening pain, fever, inability to tolerate by mouth) that necessitate immediate return. Medications administered to the patient during their visit and any new prescriptions provided to the patient are listed below.  Medications given during this visit Medications  colchicine tablet 1.2 mg (1.2 mg Oral Given 04/21/15 1436)    Discharge Medication List as of 04/21/2015  2:22 PM    START taking these medications   Details  azithromycin (ZITHROMAX) 250 MG tablet Take 1 tablet (250 mg total) by mouth daily. Take first 2 tablets together, then 1 every day until finished., Starting 04/21/2015, Until Discontinued, Print        The patient appears reasonably screen and/or stabilized for discharge and I doubt any other medical condition or other [facility] requiring further screening, evaluation, or treatment in the ED at this time prior to discharge.      Deno Etienne, DO 04/21/15 1606

## 2015-06-14 ENCOUNTER — Emergency Department (HOSPITAL_COMMUNITY): Payer: No Typology Code available for payment source

## 2015-06-14 ENCOUNTER — Emergency Department (HOSPITAL_COMMUNITY)
Admission: EM | Admit: 2015-06-14 | Discharge: 2015-06-14 | Disposition: A | Discharge status: Home / Self Care | Payer: No Typology Code available for payment source | Attending: Emergency Medicine | Admitting: Emergency Medicine

## 2015-06-14 ENCOUNTER — Encounter (HOSPITAL_COMMUNITY): Payer: Self-pay | Admitting: Emergency Medicine

## 2015-06-14 DIAGNOSIS — Z8701 Personal history of pneumonia (recurrent): Secondary | ICD-10-CM | POA: Insufficient documentation

## 2015-06-14 DIAGNOSIS — S199XXA Unspecified injury of neck, initial encounter: Secondary | ICD-10-CM | POA: Diagnosis not present

## 2015-06-14 DIAGNOSIS — M109 Gout, unspecified: Secondary | ICD-10-CM | POA: Diagnosis not present

## 2015-06-14 DIAGNOSIS — Z79899 Other long term (current) drug therapy: Secondary | ICD-10-CM | POA: Diagnosis not present

## 2015-06-14 DIAGNOSIS — Z87891 Personal history of nicotine dependence: Secondary | ICD-10-CM | POA: Diagnosis not present

## 2015-06-14 DIAGNOSIS — E119 Type 2 diabetes mellitus without complications: Secondary | ICD-10-CM | POA: Insufficient documentation

## 2015-06-14 DIAGNOSIS — Y92410 Unspecified street and highway as the place of occurrence of the external cause: Secondary | ICD-10-CM | POA: Diagnosis not present

## 2015-06-14 DIAGNOSIS — Z794 Long term (current) use of insulin: Secondary | ICD-10-CM | POA: Diagnosis not present

## 2015-06-14 DIAGNOSIS — I252 Old myocardial infarction: Secondary | ICD-10-CM | POA: Insufficient documentation

## 2015-06-14 DIAGNOSIS — Z8776 Personal history of (corrected) congenital malformations of integument, limbs and musculoskeletal system: Secondary | ICD-10-CM | POA: Diagnosis not present

## 2015-06-14 DIAGNOSIS — S3992XA Unspecified injury of lower back, initial encounter: Secondary | ICD-10-CM | POA: Insufficient documentation

## 2015-06-14 DIAGNOSIS — Z7982 Long term (current) use of aspirin: Secondary | ICD-10-CM | POA: Diagnosis not present

## 2015-06-14 DIAGNOSIS — Y998 Other external cause status: Secondary | ICD-10-CM | POA: Insufficient documentation

## 2015-06-14 DIAGNOSIS — I10 Essential (primary) hypertension: Secondary | ICD-10-CM | POA: Insufficient documentation

## 2015-06-14 DIAGNOSIS — Y9389 Activity, other specified: Secondary | ICD-10-CM | POA: Diagnosis not present

## 2015-06-14 DIAGNOSIS — M542 Cervicalgia: Secondary | ICD-10-CM | POA: Diagnosis not present

## 2015-06-14 DIAGNOSIS — I2510 Atherosclerotic heart disease of native coronary artery without angina pectoris: Secondary | ICD-10-CM | POA: Diagnosis not present

## 2015-06-14 DIAGNOSIS — Z7984 Long term (current) use of oral hypoglycemic drugs: Secondary | ICD-10-CM | POA: Diagnosis not present

## 2015-06-14 MED ORDER — CYCLOBENZAPRINE HCL 10 MG PO TABS: 10.0000 mg | ORAL_TABLET | Freq: Two times a day (BID) | ORAL | Status: DC | PRN

## 2015-06-14 MED ORDER — NAPROXEN 375 MG PO TABS
375.0000 mg | ORAL_TABLET | Freq: Two times a day (BID) | ORAL | Status: DC
Start: 2015-06-14 — End: 2016-03-22

## 2015-06-14 MED ORDER — TRAMADOL HCL 50 MG PO TABS: 50.0000 mg | ORAL_TABLET | Freq: Four times a day (QID) | ORAL | Status: DC | PRN
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
## 2015-06-14 NOTE — Discharge Instructions (Signed)

## 2015-06-14 NOTE — ED Notes (Signed)
Pt back from CT

## 2015-06-19 ENCOUNTER — Ambulatory Visit (INDEPENDENT_AMBULATORY_CARE_PROVIDER_SITE_OTHER): Payer: Medicare Other | Admitting: Internal Medicine

## 2015-06-19 ENCOUNTER — Encounter: Payer: Self-pay | Admitting: Internal Medicine

## 2015-06-19 DIAGNOSIS — R252 Cramp and spasm: Secondary | ICD-10-CM

## 2015-06-19 DIAGNOSIS — Z7984 Long term (current) use of oral hypoglycemic drugs: Secondary | ICD-10-CM | POA: Diagnosis not present

## 2015-06-19 DIAGNOSIS — M10069 Idiopathic gout, unspecified knee: Secondary | ICD-10-CM

## 2015-06-19 DIAGNOSIS — M10042 Idiopathic gout, left hand: Secondary | ICD-10-CM

## 2015-06-19 DIAGNOSIS — Z0000 Encounter for general adult medical examination without abnormal findings: Secondary | ICD-10-CM

## 2015-06-19 DIAGNOSIS — E1165 Type 2 diabetes mellitus with hyperglycemia: Secondary | ICD-10-CM | POA: Diagnosis not present

## 2015-06-19 DIAGNOSIS — M10072 Idiopathic gout, left ankle and foot: Secondary | ICD-10-CM

## 2015-06-19 DIAGNOSIS — E119 Type 2 diabetes mellitus without complications: Secondary | ICD-10-CM

## 2015-06-19 DIAGNOSIS — R2232 Localized swelling, mass and lump, left upper limb: Secondary | ICD-10-CM | POA: Diagnosis not present

## 2015-06-19 DIAGNOSIS — I10 Essential (primary) hypertension: Secondary | ICD-10-CM

## 2015-06-19 DIAGNOSIS — M10071 Idiopathic gout, right ankle and foot: Secondary | ICD-10-CM

## 2015-06-19 LAB — POCT GLYCOSYLATED HEMOGLOBIN (HGB A1C): HEMOGLOBIN A1C: 8.3

## 2015-06-19 LAB — GLUCOSE, CAPILLARY: Glucose-Capillary: 113 mg/dL — ABNORMAL HIGH (ref 65–99)

## 2015-06-19 MED ORDER — METFORMIN HCL ER 500 MG PO TB24: 500.0000 mg | ORAL_TABLET | Freq: Every day | ORAL | Status: DC

## 2015-06-19 MED ORDER — LISINOPRIL 10 MG PO TABS: 10.0000 mg | ORAL_TABLET | Freq: Every day | ORAL | Status: DC

## 2015-06-19 MED ORDER — COLCHICINE 0.6 MG PO TABS: 0.6000 mg | ORAL_TABLET | Freq: Two times a day (BID) | ORAL | Status: DC

## 2015-06-19 NOTE — Assessment & Plan Note (Signed)
HPI: He has been taking metformin IR  twice a day and is having diarreha. He is also taking glipizde.  He thinks his sugars are well controlled.  A:  Uncontrolled Type 2 DM without complications  P: - Change metformin to  ER version (less side effects) will take daily - Continue glipizide. - I encouraged adding a GLP-1 agnonist.  He is resistant today, want to decrease soft drinks, if this doesn't work he may consider GLP-1 agonist.

## 2015-06-19 NOTE — Patient Instructions (Addendum)
General Instructions:  Cut out soft drinks  Start lisinopril  daily for blood pressure  Change metformin to  of ER and take it only once a day  I want to see you back in 1-2 weeks after you get the ultrasound.  Please bring your medicines with you each time you come to clinic.  Medicines may include prescription medications, over-the-counter medications, herbal remedies, eye drops, vitamins, or other pills.   Progress Toward Treatment Goals:  Treatment Goal 09/24/2014  Hemoglobin A1C deteriorated  Blood pressure at goal    Self Care Goals & Plans:  Self Care Goal 02/10/2014  Manage my medications take my medicines as prescribed; bring my medications to every visit; refill my medications on time  Monitor my health -  Eat healthy foods drink diet soda or water instead of juice or soda; eat more vegetables; eat foods that are low in salt; eat baked foods instead of fried foods; eat fruit for snacks and desserts  Be physically active -  Meeting treatment goals -    Home Blood Glucose Monitoring 09/24/2014  Check my blood sugar once a day  When to check my blood sugar -     Care Management & Community Referrals:  Referral 02/10/2014  Referrals made for care management support diabetes educator  Referrals made to community resources -
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
## 2015-06-19 NOTE — Assessment & Plan Note (Signed)
Referral for screening colonoscopy

## 2015-06-19 NOTE — Assessment & Plan Note (Signed)
HPI: Was previously on benazepril and HCTZ with coreg, lately has just been on coreg.  He does not have allergy to ACEi  A: Essential HTN, not at goal  P:  Continue Coreg Add lisinopril  daily

## 2015-06-19 NOTE — Assessment & Plan Note (Addendum)
HPI: Has had more frequent flairs of gout over the past few weeks in his left hand, ankles and knee.  He has been taking colchicine for this but has run out.  He reports that the ED doctor told him not to take his allopurinol cause it causes gout flairs  A: Gout  P: Refill colchicine, instrcuted to take BID until gout flare resolved, then go to daily, in 1-2 month he should restart allopurinol and continue the daily colchicine for at least 3-4 months.  Can recheck Uric acid level in a few months

## 2015-06-19 NOTE — Assessment & Plan Note (Signed)
HPI: He again reports mild recurrent muscle cramping.  This has been attrbiuted to his statin medication and to dehydration in the past.  A: Muscle cramps  P: Check CMP and CK - Recommend starting MVI if not able to tolerate then B complex vitamin.

## 2015-06-20 LAB — CMP14 + ANION GAP
A/G RATIO: 1.3 (ref 1.1–2.5)
ALBUMIN: 4 g/dL (ref 3.5–5.5)
ALT: 23 IU/L (ref 0–44)
AST: 24 IU/L (ref 0–40)
Alkaline Phosphatase: 116 IU/L (ref 39–117)
Anion Gap: 18 mmol/L (ref 10.0–18.0)
BILIRUBIN TOTAL: 0.2 mg/dL (ref 0.0–1.2)
BUN / CREAT RATIO: 10 (ref 9–20)
BUN: 11 mg/dL (ref 6–24)
CALCIUM: 9.2 mg/dL (ref 8.7–10.2)
CO2: 23 mmol/L (ref 18–29)
Chloride: 99 mmol/L (ref 96–106)
Creatinine, Ser: 1.11 mg/dL (ref 0.76–1.27)
GFR, EST AFRICAN AMERICAN: 84 mL/min/{1.73_m2} (ref >59)
GFR, EST NON AFRICAN AMERICAN: 73 mL/min/{1.73_m2} (ref >59)
GLOBULIN, TOTAL: 3.2 g/dL (ref 1.5–4.5)
GLUCOSE: 126 mg/dL — AB (ref 65–99)
POTASSIUM: 4.4 mmol/L (ref 3.5–5.2)
Sodium: 140 mmol/L (ref 134–144)
TOTAL PROTEIN: 7.2 g/dL (ref 6.0–8.5)

## 2015-06-20 LAB — CK: Total CK: 229 U/L — ABNORMAL HIGH (ref 24–204)

## 2015-06-25 ENCOUNTER — Telehealth: Payer: Self-pay | Admitting: Internal Medicine

## 2015-06-25 NOTE — Telephone Encounter (Signed)
Pt is calling asking if we can write a rx for just a month instead of 3 months because he can not afford the 3 months supply. rx is for colchicine 0.6 MG tablet. Pt states it is okay to speak with Marcelino Duster if he is not available

## 2015-06-25 NOTE — Telephone Encounter (Signed)
Called pt, colchicine was not prescribed for 3mon at a time, went over directions and amount, he understands now and will call for problems, he then mentions that metformin is sending him to"the bathroom as soon as i take it". He will call back if this continues

## 2015-06-27 ENCOUNTER — Emergency Department (HOSPITAL_COMMUNITY)
Admission: EM | Admit: 2015-06-27 | Discharge: 2015-06-27 | Disposition: A | Discharge status: Home / Self Care | Payer: Medicare Other | Attending: Emergency Medicine | Admitting: Emergency Medicine

## 2015-06-27 ENCOUNTER — Encounter (HOSPITAL_COMMUNITY): Payer: Self-pay | Admitting: *Deleted

## 2015-06-27 ENCOUNTER — Emergency Department (HOSPITAL_COMMUNITY): Payer: Medicare Other

## 2015-06-27 DIAGNOSIS — R197 Diarrhea, unspecified: Secondary | ICD-10-CM | POA: Insufficient documentation

## 2015-06-27 DIAGNOSIS — Z8701 Personal history of pneumonia (recurrent): Secondary | ICD-10-CM | POA: Insufficient documentation

## 2015-06-27 DIAGNOSIS — E119 Type 2 diabetes mellitus without complications: Secondary | ICD-10-CM | POA: Diagnosis not present

## 2015-06-27 DIAGNOSIS — I252 Old myocardial infarction: Secondary | ICD-10-CM | POA: Diagnosis not present

## 2015-06-27 DIAGNOSIS — I2510 Atherosclerotic heart disease of native coronary artery without angina pectoris: Secondary | ICD-10-CM | POA: Diagnosis not present

## 2015-06-27 DIAGNOSIS — Z792 Long term (current) use of antibiotics: Secondary | ICD-10-CM | POA: Insufficient documentation

## 2015-06-27 DIAGNOSIS — Z9889 Other specified postprocedural states: Secondary | ICD-10-CM | POA: Diagnosis not present

## 2015-06-27 DIAGNOSIS — I10 Essential (primary) hypertension: Secondary | ICD-10-CM | POA: Insufficient documentation

## 2015-06-27 DIAGNOSIS — M1990 Unspecified osteoarthritis, unspecified site: Secondary | ICD-10-CM | POA: Diagnosis not present

## 2015-06-27 DIAGNOSIS — Z87891 Personal history of nicotine dependence: Secondary | ICD-10-CM | POA: Insufficient documentation

## 2015-06-27 DIAGNOSIS — Z7982 Long term (current) use of aspirin: Secondary | ICD-10-CM | POA: Diagnosis not present

## 2015-06-27 DIAGNOSIS — Q660 Congenital talipes equinovarus: Secondary | ICD-10-CM | POA: Insufficient documentation

## 2015-06-27 DIAGNOSIS — M7989 Other specified soft tissue disorders: Secondary | ICD-10-CM | POA: Diagnosis not present

## 2015-06-27 DIAGNOSIS — M79671 Pain in right foot: Secondary | ICD-10-CM

## 2015-06-27 DIAGNOSIS — Z7984 Long term (current) use of oral hypoglycemic drugs: Secondary | ICD-10-CM | POA: Diagnosis not present

## 2015-06-27 DIAGNOSIS — Z791 Long term (current) use of non-steroidal anti-inflammatories (NSAID): Secondary | ICD-10-CM | POA: Insufficient documentation

## 2015-06-27 DIAGNOSIS — Z79899 Other long term (current) drug therapy: Secondary | ICD-10-CM | POA: Insufficient documentation

## 2015-06-27 MED ORDER — IBUPROFEN 400 MG PO TABS
800.0000 mg | ORAL_TABLET | Freq: Once | ORAL | Status: AC
  Administered 2015-06-27: 800 mg via ORAL
  Filled 2015-06-27: qty 2

## 2015-06-27 NOTE — ED Notes (Signed)
Pt reports RT foot pain from gout. colchicine for gout but no relief.Marland Kitchen

## 2015-06-27 NOTE — Progress Notes (Signed)
Internal Medicine Clinic Attending  I saw and evaluated the patient.  I personally confirmed the key portions of the history and exam documented by Dr. Hoffman and I reviewed pertinent patient test results.  The assessment, diagnosis, and plan were formulated together and I agree with the documentation in the resident's note.      

## 2015-06-27 NOTE — ED Provider Notes (Signed)
CSN: [id number]     Arrival date & time 06/27/15  0840 History  By signing my name below, I, Logan Joldersma, attest that this documentation has been prepared under the direction and in the presence of Benjamin Cartner, PA-C. Electronically Signed: Logan Joldersma, ED Scribe. 06/27/2015. 9:21 AM.    Chief Complaint  Patient presents with  . Foot Pain   The history is provided by the patient. No language interpreter was used.   HPI Comments: Adir Seto is a 60 y.o. male who presents to the Emergency Department complaining of intermittent, moderate, non-radiating, right foot pain onset 1 month ago. He takes colchicine which he ran out of 3 days ago stating it provided little relief further noting that his pain has stayed consistent since running out of his medication. Pt additionally took Tylenol and applied heat/ice without any significant relief. He reports associated difficulty sleeping due to pain and worsening pain with palpation or movement of the left foot. He is ambulatory at baseline and ambulates with crutches as needed to manage elevated pain levels. Pt notes a PMHx of bilateral clubfoot and a lengthy SHx to both feet. He notes a PMHx of DM and takes Metformin which he says causes associated diarrhea. He received an rx for additional colchicine (60 tablets) but states he could not afford it at this time. He denies fevers, chills, n/v, diaphoresis, abd pain or any other associated symptoms at this time.    Past Medical History  Diagnosis Date  . Coronary artery disease     Holter monitor 02/2012 - sinus with rare PVC  . Gout   . Hyperlipidemia   . Hypertension   . Osteomyelitis ([facility])   . CTEV (congenital talipes equinovarus)   . Myocardial infarction ([facility]) ~ 2007    "mild"  . Type II diabetes mellitus ([facility])   . Club foot of both lower extremities 1958  . Pneumonia 2000's    "couple times"   Past Surgical History  Procedure Laterality Date  . Anterior cervical  decomp/discectomy fusion  X 2  . Foot surgery Left x16    "joints collapsed; keep getting infected"  . Posterior cervical fusion/foraminotomy  X 2  . Foot fracture surgery Right 1990's    "pt a steel plate in"  . Cardiac catheterization     Family History  Problem Relation Age of Onset  . Diabetes Mother   . Coronary artery disease Mother     HAS PACEMAKER  . Heart disease Mother   . Heart attack Brother    Social History  Substance Use Topics  . Smoking status: Former Smoker -- 1.00 packs/day for 32 years    Types: Cigarettes    Quit date: 07/06/2007  . Smokeless tobacco: Never Used  . Alcohol Use: 0.0 oz/week    0 Standard drinks or equivalent per week     Comment: 1 can of beer every 4 days.    Review of Systems A complete 10 system review of systems was obtained and all systems are negative except as noted in the HPI and PMH.   Allergies  Morphine and Pravastatin sodium  Home Medications   Prior to Admission medications   Medication Sig Start Date End Date Taking? Authorizing Provider  allopurinol (ZYLOPRIM) 100 MG tablet Take 1 tablet (100 mg total) by mouth daily. 03/19/15   Parth Saraiya, MD  aspirin EC 81 MG tablet Take 1 tablet (81 mg total) by mouth daily. 03/19/15   Parth Saraiya, MD  azithromycin (ZITHROMAX)   250 MG tablet Take 1 tablet (250 mg total) by mouth daily. Take first 2 tablets together, then 1 every day until finished. 04/21/15   Dan Floyd, DO  Blood Glucose Monitoring Suppl (ONE TOUCH ULTRA 2) W/DEVICE KIT Check blood sugar twice daily as instructed dx code 250.00 07/10/13   Richard Kazibwe, MD  carvedilol (COREG) 6.25 MG tablet Take 1 tablet (6.25 mg total) by mouth 2 (two) times daily. 03/19/15   Parth Saraiya, MD  colchicine 0.6 MG tablet Take 1 tablet (0.6 mg total) by mouth 2 (two) times daily. For gout flare 06/19/15 06/18/16  Erik C Hoffman, DO  cyclobenzaprine (FLEXERIL) 10 MG tablet Take 1 tablet (10 mg total) by mouth 2 (two) times daily as  needed for muscle spasms. 06/14/15   Abigail Harris, PA-C  diphenhydramine-acetaminophen (TYLENOL PM) 25-500 MG TABS tablet Take 1-2 tablets by mouth 2 (two) times daily as needed (pain).    Historical Provider, MD  glipiZIDE (GLUCOTROL XL) 5 MG 24 hr tablet Take 1 tablet (5 mg total) by mouth daily with breakfast. 03/19/15   Parth Saraiya, MD  glucose blood (ONE TOUCH ULTRA TEST) test strip Check blood sugar twice daily as instructed dx code 250.00 03/19/15   Parth Saraiya, MD  Lancet Devices MISC 1 kit by Does not apply route 3 (three) times daily. 06/29/12   Richard Kazibwe, MD  lisinopril (PRINIVIL,ZESTRIL) 10 MG tablet Take 1 tablet (10 mg total) by mouth daily. 06/19/15 06/18/16  Erik C Hoffman, DO  metFORMIN (GLUCOPHAGE XR) 500 MG 24 hr tablet Take 1 tablet (500 mg total) by mouth daily with breakfast. 06/19/15 06/18/16  Erik C Hoffman, DO  naproxen (NAPROSYN) 375 MG tablet Take 1 tablet (375 mg total) by mouth 2 (two) times daily. 06/14/15   Abigail Harris, PA-C  ONETOUCH DELICA LANCETS FINE MISC Check blood sugar twice daily as instructed dx code 250.00 07/10/13   Richard Kazibwe, MD  traMADol (ULTRAM) 50 MG tablet Take 1 tablet (50 mg total) by mouth every 6 (six) hours as needed. 06/14/15   Abigail Harris, PA-C   BP 169/108 mmHg  Pulse 90  Resp 16  Ht 5' 8" (1.727 m)  Wt 109.317 kg  BMI 36.65 kg/m2  SpO2 95%    Physical Exam  Constitutional: He is oriented to person, place, and time. He appears well-developed and well-nourished.  HENT:  Head: Normocephalic and atraumatic.  Eyes: EOM are normal.  Neck: Normal range of motion.  Cardiovascular: Normal rate.   Pulmonary/Chest: Effort normal. No respiratory distress.  Abdominal: Soft. There is no tenderness.  Musculoskeletal: Normal range of motion. He exhibits tenderness. He exhibits no edema.  Tender diffusely throughout fourth metatarsal of right foot on dorsal and plantar aspect; no erythema, swelling or warmth; chronic post surgical  changes noted. No ankle pain. No knee pain-full range of motion.  Neurological: He is alert and oriented to person, place, and time.  Skin: Skin is warm and dry. No erythema.  Psychiatric: He has a normal mood and affect.  Nursing note and vitals reviewed.   ED Course  Procedures  DIAGNOSTIC STUDIES: Oxygen Saturation is 95% on RA, adequate by my interpretation.    COORDINATION OF CARE: 9:15 AM Discussed next steps with pt. He verbalized understanding and is agreeable with the plan.   Labs Review Labs Reviewed - No data to display  Imaging Review Dg Foot Complete Right  06/27/2015  CLINICAL DATA:  Pt reports right foot pain near the 4th metatarsal x 1   month; he reports occasional swelling; he also states he is unable to bear weight on his right foot; pt reports h/o surgery to right foot 10 yrs ago; pt is diabetic w/ history of gout; NKI EXAM: RIGHT FOOT COMPLETE - 3+ VIEW COMPARISON:  06/12/2012 FINDINGS: No fracture. Joints are normally aligned. There are no areas of bone resorption to suggest osteomyelitis. Patient has had a previous fusion between the talus, navicular and calcaneus, stable. There stable spurring at the navicular cuneiform articulation dorsally. Soft tissues are unremarkable. IMPRESSION: 1. No fracture or acute finding. Electronically Signed   By: Lajean Manes M.D.   On: 06/27/2015 09:46   I have personally reviewed and evaluated these images as part of my medical decision-making.   EKG Interpretation None     Meds given in ED:  Medications  ibuprofen (ADVIL,MOTRIN) tablet 800 mg (not administered)    New Prescriptions   No medications on file   Filed Vitals:   06/27/15 0851  BP: 169/108  Pulse: 90  TempSrc: Oral  Resp: 16  Height: 5' 8" (1.727 m)  Weight: 109.317 kg  SpO2: 95%    MDM  Anjelo Pullman is a 60 y.o. male history of gout comes in for evaluation of right foot pain that he attributes to a gouty flare. He ran out of colchicine 3 days  ago, pain has persisted, but not worsened. On arrival, he is hemodynamically stable and afebrile. On exam, he does have some tenderness around his fourth metatarsal with no other objective findings. No other MTP, ankle pain, no erythema or warmth to suggest gouty flare. Question possible Morton's neuroma versus stress fracture. No evidence of cellulitis. We'll obtain x-ray for further evaluation. Plan to have patient follow-up with his PCP at internal medicine clinic with possible referral to podiatry.  Final diagnoses:  Right foot pain    I personally performed the services described in this documentation, which was scribed in my presence. The recorded information has been reviewed and is accurate.    Comer Locket, PA-C 06/27/15 1038  Gareth Morgan, MD 06/29/15 2257

## 2015-06-27 NOTE — Discharge Instructions (Signed)
It is important to follow up with your doctor for reevaluation of your foot pain. He may also benefit from seeing an orthopedist or podiatrist to help with your foot pain. You may continue taking Motrin and Tylenol for discomfort. Return to ED for any new or worsening symptoms.  Musculoskeletal Pain Musculoskeletal pain is muscle and boney aches and pains. These pains can occur in any part of the body. Your caregiver may treat you without knowing the cause of the pain. They may treat you if blood or urine tests, X-rays, and other tests were normal.  CAUSES There is often not a definite cause or reason for these pains. These pains may be caused by a type of germ (virus). The discomfort may also come from overuse. Overuse includes working out too hard when your body is not fit. Boney aches also come from weather changes. Bone is sensitive to atmospheric pressure changes. HOME CARE INSTRUCTIONS   Ask when your test results will be ready. Make sure you get your test results.  Only take over-the-counter or prescription medicines for pain, discomfort, or fever as directed by your caregiver. If you were given medications for your condition, do not drive, operate machinery or power tools, or sign legal documents for 24 hours. Do not drink alcohol. Do not take sleeping pills or other medications that may interfere with treatment.  Continue all activities unless the activities cause more pain. When the pain lessens, slowly resume normal activities. Gradually increase the intensity and duration of the activities or exercise.  During periods of severe pain, bed rest may be helpful. Lay or sit in any position that is comfortable.  Putting ice on the injured area.  Put ice in a bag.  Place a towel between your skin and the bag.  Leave the ice on for 15 to 20 minutes, 3 to 4 times a day.  Follow up with your caregiver for continued problems and no reason can be found for the pain. If the pain becomes worse  or does not go away, it may be necessary to repeat tests or do additional testing. Your caregiver may need to look further for a possible cause. SEEK IMMEDIATE MEDICAL CARE IF:  You have pain that is getting worse and is not relieved by medications.  You develop chest pain that is associated with shortness or breath, sweating, feeling sick to your stomach (nauseous), or throw up (vomit).  Your pain becomes localized to the abdomen.  You develop any new symptoms that seem different or that concern you. MAKE SURE YOU:   Understand these instructions.  Will watch your condition.  Will get help right away if you are not doing well or get worse.   This information is not intended to replace advice given to you by your health care provider. Make sure you discuss any questions you have with your health care provider.   Document Released: 04/25/2005 Document Revised: 07/18/2011 Document Reviewed: 12/28/2012 Elsevier Interactive Patient Education [employer]

## 2015-06-30 NOTE — Telephone Encounter (Signed)
Thanks Myriam Jacobson for calling the patient. I think he is having adverse effects from colchicine, which can cause diarrhea.  Patient is only taking metformin 500 mg daily which is a low dose, but it can also cause this  Regis Bill patient can call back if he continues to experience adverse symptoms like diarrhea.  Colchicine can be switched to different agent .   Thanks  [employer]

## 2015-07-02 NOTE — Telephone Encounter (Signed)
Tried to call pt, lm for rtc 

## 2015-07-06 NOTE — Telephone Encounter (Signed)
No answer again 

## 2015-07-09 ENCOUNTER — Encounter (HOSPITAL_COMMUNITY): Payer: Self-pay | Admitting: *Deleted

## 2015-07-09 ENCOUNTER — Observation Stay (HOSPITAL_COMMUNITY)
Admission: EM | Admit: 2015-07-09 | Discharge: 2015-07-10 | Disposition: A | Discharge status: Home / Self Care | Payer: Medicare Other | Attending: Internal Medicine | Admitting: Internal Medicine

## 2015-07-09 ENCOUNTER — Emergency Department (HOSPITAL_COMMUNITY): Payer: Medicare Other

## 2015-07-09 DIAGNOSIS — N179 Acute kidney failure, unspecified: Secondary | ICD-10-CM | POA: Diagnosis not present

## 2015-07-09 DIAGNOSIS — M109 Gout, unspecified: Secondary | ICD-10-CM | POA: Diagnosis present

## 2015-07-09 DIAGNOSIS — E119 Type 2 diabetes mellitus without complications: Secondary | ICD-10-CM | POA: Insufficient documentation

## 2015-07-09 DIAGNOSIS — E860 Dehydration: Secondary | ICD-10-CM | POA: Diagnosis not present

## 2015-07-09 DIAGNOSIS — G4739 Other sleep apnea: Secondary | ICD-10-CM | POA: Diagnosis present

## 2015-07-09 DIAGNOSIS — I10 Essential (primary) hypertension: Secondary | ICD-10-CM | POA: Diagnosis not present

## 2015-07-09 DIAGNOSIS — E7800 Pure hypercholesterolemia, unspecified: Secondary | ICD-10-CM | POA: Diagnosis not present

## 2015-07-09 DIAGNOSIS — Z87891 Personal history of nicotine dependence: Secondary | ICD-10-CM | POA: Diagnosis not present

## 2015-07-09 DIAGNOSIS — G4733 Obstructive sleep apnea (adult) (pediatric): Secondary | ICD-10-CM | POA: Insufficient documentation

## 2015-07-09 DIAGNOSIS — R509 Fever, unspecified: Secondary | ICD-10-CM | POA: Diagnosis not present

## 2015-07-09 DIAGNOSIS — E0865 Diabetes mellitus due to underlying condition with hyperglycemia: Secondary | ICD-10-CM

## 2015-07-09 DIAGNOSIS — R079 Chest pain, unspecified: Secondary | ICD-10-CM | POA: Diagnosis present

## 2015-07-09 DIAGNOSIS — Z981 Arthrodesis status: Secondary | ICD-10-CM | POA: Diagnosis not present

## 2015-07-09 DIAGNOSIS — E785 Hyperlipidemia, unspecified: Secondary | ICD-10-CM | POA: Diagnosis not present

## 2015-07-09 DIAGNOSIS — R0602 Shortness of breath: Secondary | ICD-10-CM | POA: Diagnosis present

## 2015-07-09 DIAGNOSIS — J069 Acute upper respiratory infection, unspecified: Secondary | ICD-10-CM

## 2015-07-09 DIAGNOSIS — Z7982 Long term (current) use of aspirin: Secondary | ICD-10-CM | POA: Insufficient documentation

## 2015-07-09 DIAGNOSIS — R0789 Other chest pain: Secondary | ICD-10-CM | POA: Diagnosis not present

## 2015-07-09 DIAGNOSIS — J111 Influenza due to unidentified influenza virus with other respiratory manifestations: Secondary | ICD-10-CM | POA: Diagnosis present

## 2015-07-09 DIAGNOSIS — B9789 Other viral agents as the cause of diseases classified elsewhere: Secondary | ICD-10-CM

## 2015-07-09 DIAGNOSIS — J09X2 Influenza due to identified novel influenza A virus with other respiratory manifestations: Principal | ICD-10-CM | POA: Insufficient documentation

## 2015-07-09 DIAGNOSIS — I252 Old myocardial infarction: Secondary | ICD-10-CM | POA: Diagnosis not present

## 2015-07-09 DIAGNOSIS — G8929 Other chronic pain: Secondary | ICD-10-CM | POA: Insufficient documentation

## 2015-07-09 DIAGNOSIS — Z7984 Long term (current) use of oral hypoglycemic drugs: Secondary | ICD-10-CM | POA: Insufficient documentation

## 2015-07-09 DIAGNOSIS — I2510 Atherosclerotic heart disease of native coronary artery without angina pectoris: Secondary | ICD-10-CM | POA: Diagnosis not present

## 2015-07-09 DIAGNOSIS — E1165 Type 2 diabetes mellitus with hyperglycemia: Secondary | ICD-10-CM

## 2015-07-09 LAB — CBC
HEMATOCRIT: 41.6 % (ref 39.0–52.0)
Hemoglobin: 13.4 g/dL (ref 13.0–17.0)
MCH: 22.1 pg — ABNORMAL LOW (ref 26.0–34.0)
MCHC: 32.2 g/dL (ref 30.0–36.0)
MCV: 68.5 fL — AB (ref 78.0–100.0)
PLATELETS: 270 10*3/uL (ref 150–400)
RBC: 6.07 MIL/uL — ABNORMAL HIGH (ref 4.22–5.81)
RDW: 14.5 % (ref 11.5–15.5)
WBC: 10.5 10*3/uL (ref 4.0–10.5)

## 2015-07-09 LAB — I-STAT TROPONIN, ED
Troponin i, poc: 0.01 ng/mL (ref 0.00–0.08)
Troponin i, poc: 0 ng/mL (ref 0.00–0.08)

## 2015-07-09 LAB — URINALYSIS, ROUTINE W REFLEX MICROSCOPIC
Bilirubin Urine: NEGATIVE
GLUCOSE, UA: NEGATIVE mg/dL
HGB URINE DIPSTICK: NEGATIVE
Ketones, ur: NEGATIVE mg/dL
LEUKOCYTES UA: NEGATIVE
Nitrite: NEGATIVE
PH: 7 (ref 5.0–8.0)
PROTEIN: NEGATIVE mg/dL
SPECIFIC GRAVITY, URINE: 1.015 (ref 1.005–1.030)

## 2015-07-09 LAB — BASIC METABOLIC PANEL
ANION GAP: 10 (ref 5–15)
BUN: 13 mg/dL (ref 6–20)
CO2: 25 mmol/L (ref 22–32)
CREATININE: 1.26 mg/dL — AB (ref 0.61–1.24)
Calcium: 9.5 mg/dL (ref 8.9–10.3)
Chloride: 102 mmol/L (ref 101–111)
GFR calc Af Amer: >60 mL/min (ref >60)
GFR calc non Af Amer: >60 mL/min (ref >60)
GLUCOSE: 130 mg/dL — AB (ref 65–99)
Potassium: 4.7 mmol/L (ref 3.5–5.1)
Sodium: 137 mmol/L (ref 135–145)

## 2015-07-09 LAB — I-STAT CG4 LACTIC ACID, ED: LACTIC ACID, VENOUS: 1.11 mmol/L (ref 0.5–2.0)

## 2015-07-09 MED ORDER — OSELTAMIVIR PHOSPHATE 75 MG PO CAPS
75.0000 mg | ORAL_CAPSULE | Freq: Once | ORAL | Status: AC
  Administered 2015-07-09: 75 mg via ORAL
  Filled 2015-07-09 (×2): qty 1

## 2015-07-09 MED ORDER — ACETAMINOPHEN 325 MG PO TABS
650.0000 mg | ORAL_TABLET | Freq: Once | ORAL | Status: AC
  Administered 2015-07-09: 650 mg via ORAL
  Filled 2015-07-09: qty 2

## 2015-07-09 MED ORDER — ACETAMINOPHEN 325 MG PO TABS: 650.0000 mg | ORAL_TABLET | Freq: Four times a day (QID) | ORAL | Status: DC | PRN

## 2015-07-09 MED ORDER — SODIUM CHLORIDE 0.9 % IV SOLN
Freq: Once | INTRAVENOUS | Status: AC
  Administered 2015-07-09: 1000 mL via INTRAVENOUS

## 2015-07-09 MED ORDER — ONDANSETRON HCL 4 MG/2ML IJ SOLN
4.0000 mg | Freq: Once | INTRAMUSCULAR | Status: AC
  Administered 2015-07-09: 4 mg via INTRAVENOUS
  Filled 2015-07-09: qty 2

## 2015-07-09 NOTE — ED Notes (Signed)
Per [facility] - pt from home w/ c/o chest pain that began approx 12pm today, pt reports pain is pounding and worse w/ deep inspiration and cough. Pt diaphoretic on arrival, denies n/v - hx of previous MI. Pt given  ASA by EMS en route.

## 2015-07-09 NOTE — H&P (Signed)
Date: 07/10/2015               Patient Name:  Daniel Perkins MRN: [id number]  DOB: [birth year] Age / Sex: 60 y.o., male   PCP: Daniel Estelle, MD         Medical Service: Internal Medicine Teaching Service         Attending Physician: Dr. Lacretia Leigh, MD    First Contact: Dr. Lindon Perkins Pager: [phone]  Second Contact: Dr. Jacques Perkins Pager: [phone]       After Hours (After 5p/  First Contact Pager: [phone]  weekends / holidays): Second Contact Pager: [phone]   Chief Complaint: Cough, Chest Pain  History of Present Illness: Daniel Perkins is a 60 year old male with a past medical history of DM type II, HTN, HLD, CAD s/p MI in 2007 and gout presenting to [facility] ED tonight with 1 day history of cough and acute onset chest pain today. He reports he developed a cough yesterday with no sputum production. He felt well this morning but began feeling ill around 10 AM with worsening cough. He denies any fevers but does report chills. Cough has progressively gotten worse and reports shortness of breath and upper back and chest pain with coughing. He reports sharp, stabbing pain in his upper back. The chest pain is diffuse across his chest and describes it as a pressure, like someone is standing on his chest. Pain radiates down his legs with coughing. No radiation to his arms or jaw. Does report myalgias and headache today. Has had dizziness upon standing today. He reports rhinorrhea and a sore throat that began today. Developed nausea in the ED but no emesis. No diarrhea or dysuria. Girlfriend's son was sick a few weeks ago with URI. No recent travel. Patient was febrile to 101.4 on arrival to the ED.   Meds: Current Facility-Administered Medications  Medication Dose Route Frequency Provider Last Rate Last Dose  . acetaminophen (TYLENOL) tablet 650 mg  650 mg Oral Q6H PRN Daniel Oman, DO      . guaiFENesin [facility]) 12 hr tablet 600 mg  600 mg Oral BID PRN Daniel Oman, DO       Current  Outpatient Prescriptions  Medication Sig Dispense Refill  . aspirin EC 81 MG tablet Take 1 tablet (81 mg total) by mouth daily. 30 tablet 3  . carvedilol (COREG) 6.25 MG tablet Take 1 tablet (6.25 mg total) by mouth 2 (two) times daily. 180 tablet 3  . colchicine 0.6 MG tablet Take 1 tablet (0.6 mg total) by mouth 2 (two) times daily. For gout flare 60 tablet 3  . cyclobenzaprine (FLEXERIL) 10 MG tablet Take 1 tablet (10 mg total) by mouth 2 (two) times daily as needed for muscle spasms. 20 tablet 0  . diphenhydramine-acetaminophen (TYLENOL PM) 25-500 MG TABS tablet Take 1-2 tablets by mouth 2 (two) times daily as needed (pain).    Marland Kitchen glipiZIDE (GLUCOTROL XL) 5 MG 24 hr tablet Take 1 tablet (5 mg total) by mouth daily with breakfast. 90 tablet 3  . lisinopril (PRINIVIL,ZESTRIL) 10 MG tablet Take 1 tablet (10 mg total) by mouth daily. 30 tablet 11  . metFORMIN (GLUCOPHAGE XR) 500 MG 24 hr tablet Take 1 tablet (500 mg total) by mouth daily with breakfast. 30 tablet 11  . naproxen (NAPROSYN) 375 MG tablet Take 1 tablet (375 mg total) by mouth 2 (two) times daily. (Patient taking differently: Take 375 mg by mouth 2 (two) times daily  as needed for mild pain. ) 20 tablet 0  . traMADol (ULTRAM) 50 MG tablet Take 1 tablet (50 mg total) by mouth every 6 (six) hours as needed. (Patient taking differently: Take 50 mg by mouth every 6 (six) hours as needed for moderate pain. ) 15 tablet 0  . allopurinol (ZYLOPRIM) 100 MG tablet Take 1 tablet (100 mg total) by mouth daily. (Patient not taking: Reported on 07/09/2015) 30 tablet 2  . Blood Glucose Monitoring Suppl (ONE TOUCH ULTRA 2) W/DEVICE KIT Check blood sugar twice daily as instructed dx code 250.00 1 each 0  . glucose blood (ONE TOUCH ULTRA TEST) test strip Check blood sugar twice daily as instructed dx code 250.00 100 each 5  . Lancet Devices MISC 1 kit by Does not apply route 3 (three) times daily. 100 each 11  . ONETOUCH DELICA LANCETS FINE MISC Check blood  sugar twice daily as instructed dx code 250.00 100 each 12    Allergies: Allergies as of 07/09/2015 - Review Complete 07/09/2015  Allergen Reaction Noted  . Morphine Itching 08/10/2009  . Pravastatin sodium Other (See Comments) 06/28/2012   Past Medical History  Diagnosis Date  . Coronary artery disease     Holter monitor 02/2012 - sinus with rare PVC  . Gout   . Hyperlipidemia   . Hypertension   . Osteomyelitis ([place])   . CTEV (congenital talipes equinovarus)   . Myocardial infarction [facility]) ~ 2007    "mild"  . Type II diabetes mellitus ([place])   . Club foot of both lower extremities 1958  . Pneumonia 2000's    "couple times"   Past Surgical History  Procedure Laterality Date  . Anterior cervical decomp/discectomy fusion  X 2  . Foot surgery Left x16    "joints collapsed; keep getting infected"  . Posterior cervical fusion/foraminotomy  X 2  . Foot fracture surgery Right 1990's    "pt a steel plate in"  . Cardiac catheterization     Family History  Problem Relation Age of Onset  . Diabetes Mother   . Coronary artery disease Mother     HAS PACEMAKER  . Heart disease Mother   . Heart attack Brother    Social History   Social History  . Marital Status: Legally Separated    Spouse Name: N/A  . Number of Children: N/A  . Years of Education: 13   Occupational History  .    . unemployed    Social History Main Topics  . Smoking status: Former Smoker -- 1.00 packs/day for 32 years    Types: Cigarettes    Quit date: 07/06/2007  . Smokeless tobacco: Never Used  . Alcohol Use: 0.0 oz/week    0 Standard drinks or equivalent per week     Comment: 1 can of beer every 4 days.  . Drug Use: No  . Sexual Activity: Not on file   Other Topics Concern  . Not on file   Social History Narrative    Review of Systems: Pertinent items noted in HPI and remainder of comprehensive ROS otherwise negative.  Physical Exam: Blood pressure 120/89, pulse 107, temperature 98.7 F  (37.1 C), temperature source Oral, resp. rate 19, SpO2 97 %. General: alert, well-developed, and cooperative to examination.  Head: normocephalic and atraumatic.  Eyes: vision grossly intact, pupils equal, pupils round, pupils reactive to light, no injection and anicteric.  Mouth: pharynx pink and moist, no erythema, and no exudates.  Neck: supple, limited ROM 2/2  multiple surgeries, no thyromegaly, no JVD, and no carotid bruits. No meningeal signs.  Lungs: normal respiratory effort, no accessory muscle use, normal breath sounds, no crackles, and no wheezes. Heart: tachycardic, regular rhythm, no murmur, no gallop, and no rub.  Abdomen: soft, non-tender, normal bowel sounds, no distention, no guarding, no rebound tenderness. Msk: no joint swelling, no joint warmth, and no redness over joints.  Pulses: faint DP/PT pulses bilaterally Extremities: No cyanosis, clubbing, edema Neurologic: alert & oriented X3, cranial nerves II-XII intact, strength normal in all extremities, sensation intact to light touch Skin: turgor normal and no rashes.  Psych: normal mood and affect  Lab results: Basic Metabolic Panel:  Recent Labs  07/09/15 2015  NA 137  K 4.7  CL 102  CO2 25  GLUCOSE 130*  BUN 13  CREATININE 1.26*  CALCIUM 9.5   CBC:  Recent Labs  07/09/15 2015  WBC 10.5  HGB 13.4  HCT 41.6  MCV 68.5*  PLT 270   Urine Drug Screen: Drugs of Abuse     Component Value Date/Time   LABOPIA NONE DETECTED 10/10/2013 1709   COCAINSCRNUR NONE DETECTED 10/10/2013 1709   LABBENZ NONE DETECTED 10/10/2013 1709   AMPHETMU NONE DETECTED 10/10/2013 1709   THCU NONE DETECTED 10/10/2013 1709   LABBARB NONE DETECTED 10/10/2013 1709    Alcohol Level: No results for input(s): ETH in the last 72 hours. Urinalysis:  Recent Labs  07/09/15 2219  COLORURINE YELLOW  LABSPEC 1.015  PHURINE 7.0  GLUCOSEU NEGATIVE  HGBUR NEGATIVE  BILIRUBINUR NEGATIVE  KETONESUR NEGATIVE  PROTEINUR NEGATIVE    NITRITE NEGATIVE  LEUKOCYTESUR NEGATIVE    Imaging results:  Dg Chest 2 View  07/09/2015  CLINICAL DATA:  Shortness of breath and fever EXAM: CHEST  2 VIEW COMPARISON:  04/21/2015 FINDINGS: Cardiac shadow is within normal limits. The lungs are well aerated bilaterally. No acute bony abnormality is noted. Postsurgical changes in the cervical spine are seen. IMPRESSION: No acute abnormality noted. Electronically Signed   By: Inez Catalina M.D.   On: 07/09/2015 20:36    Other results: EKG: unchanged from previous tracings, sinus tachycardia.  Assessment & Plan by Problem: Active Problems:   * No active hospital problems. *  Viral URI likely influenza: Patient with acute onset upper respiratory symptoms with non-productive cough and myalgias. Febrile to 101.4 and tachycardic on arrival to ED. Mild leukocytosis to 10.5. CXR with no acute abnormalities. Lactate 1.1. Neck stable. No meningeal signs. Patient with chronic neck pain with multiple surgeries but unchanged from baseline. Nothing to suggest meningitis.  -Influenza PCR -Tamiflu -Keep O2 sats >92% -Orthostatic vitals -Mucinex prn -Supportive care -Blood cultures drawn -CBC/BMP in am -NS 100 mL/hr  Chest Pain with history of nonobstructive CAD: Patient with acute onset chest pain that he describes as a pressure like someone is standing on his chest. Does report some shortness of breath but has improved. EKG shows sinus tachycardia with no Q waves, ST changes or T wave inversions. Troponin negative x 1. Patient with a history of an MI in 2007, cath in 2011 revealed mild non-obstructive CAD. Nuclear stress test done 11/2014 showed EF of 62% with T wave inversion noted during the stress test and small defect in the basal inferolateral and mild inferolateral region. Overall, the study was low risk. He reports he does not take a baby aspirin daily because he forgets. Does report being compliant with his Lisinopril and believes he takes the Coreg  daily (unsure about the  name).  telemetry -trend troponins -EKG in am -UDS -ASA 81 mg daily -Coreg 6.25 mg bid  DM: A1c 8.3 06/19/15. Patient suppose to be taking Metformin 500 ER mg daily and Glipizide 5 mg daily. Reports he does not take the metformin due to diarrhea.  -SSI-sensitive -CBG qac  HTN: BP elevated to 432W systolic on arrival to ED. Improved to 037D systolic. Takes lisinopril 10 mg daily and coreg 6.25 mg bid at home.  -Continue home dose Coreg -Hold Lisinopril  Pusitile mass of left upper extremity: Patient with pulsatile mass on left wrist. Has been present for 1.5 months. Denies any trauma or any needle injections. Was seen in clinic on 2/10 with [place] ordered but never done.   Gout: Patient reports he was told to stop Allopurinol several weeks ago and has been taking colchicine daily.  -Continue home dose colchicine  OSA: Patient with STOP BANG score of 7 indicative of high risk for OSA noted prior to previous surgery. Was referred for sleep study but was never done. Does not use CPAP at night.   DVT PPx: Lovenox   Dispo: Disposition is deferred at this time, awaiting improvement of current medical problems. Anticipated discharge in approximately 1-2 day(s).   The patient does have a current PCP Daniel Estelle, MD) and does need an [facility] hospital follow-up appointment after discharge.  The patient does not have transportation limitations that hinder transportation to clinic appointments.  Signed: Maryellen Pile, MD 07/10/2015, 12:30 AM

## 2015-07-09 NOTE — ED Notes (Signed)
Patient transported to X-ray 

## 2015-07-09 NOTE — ED Provider Notes (Signed)
CSN: [id number]     Arrival date & time 07/09/15  2000 History   First MD Initiated Contact with Patient 07/09/15 2022     Chief Complaint  Patient presents with  . Chest Pain  . Fever     (Consider location/radiation/quality/duration/timing/severity/associated sxs/prior Treatment) HPI Comments: Is 60 year old male with history of diabetes, hypertension, hypercholesterolemia, MI approximately 10 years ago who reports that yesterday he started feeling ill with nonspecific symptoms but with cough that is progressively gotten worse.  Today he developed fever, myalgias.  He coughs to the point where he feels short of breath and has chest pressure, stating it feels like there is something sitting on his chest.  Driving in the emergency department.  He is also developed nausea  Patient is a 60 y.o. male presenting with chest pain and fever. The history is provided by the patient.  Chest Pain Pain location:  Substernal area, L chest and R chest Pain quality: aching   Pain radiates to:  Does not radiate Pain radiates to the back: no   Pain severity:  Moderate Onset quality:  Gradual Duration:  2 days Timing:  Intermittent Progression:  Worsening Chronicity:  New Context comment:  Coughing Relieved by:  Nothing Exacerbated by: coughing. Ineffective treatments:  None tried Associated symptoms: fever and shortness of breath   Fever:    Timing:  Unable to specify   Temp source:  Subjective   Progression:  Worsening Shortness of breath:    Severity:  Moderate   Onset quality:  Sudden   Duration:  1 day   Timing:  Constant   Progression:  Worsening Risk factors: diabetes mellitus, high cholesterol, hypertension and male sex   Fever Associated symptoms: chest pain     Past Medical History  Diagnosis Date  . Coronary artery disease     Holter monitor 02/2012 - sinus with rare PVC  . Gout   . Hyperlipidemia   . Hypertension   . Osteomyelitis ([place])   . CTEV (congenital talipes  equinovarus)   . Myocardial infarction [facility]) ~ 2007    "mild"  . Type II diabetes mellitus ([place])   . Club foot of both lower extremities 1958  . Pneumonia 2000's    "couple times"   Past Surgical History  Procedure Laterality Date  . Anterior cervical decomp/discectomy fusion  X 2  . Foot surgery Left x16    "joints collapsed; keep getting infected"  . Posterior cervical fusion/foraminotomy  X 2  . Foot fracture surgery Right 1990's    "pt a steel plate in"  . Cardiac catheterization     Family History  Problem Relation Age of Onset  . Diabetes Mother   . Coronary artery disease Mother     HAS PACEMAKER  . Heart disease Mother   . Heart attack Brother    Social History  Substance Use Topics  . Smoking status: Former Smoker -- 1.00 packs/day for 32 years    Types: Cigarettes    Quit date: 07/06/2007  . Smokeless tobacco: Never Used  . Alcohol Use: 0.0 oz/week    0 Standard drinks or equivalent per week     Comment: 1 can of beer every 4 days.    Review of Systems  Constitutional: Positive for fever.  Respiratory: Positive for shortness of breath.   Cardiovascular: Positive for chest pain.      Allergies  Morphine and Pravastatin sodium  Home Medications   Prior to Admission medications   Medication Sig Start  Date End Date Taking? Authorizing Provider  aspirin EC 81 MG tablet Take 1 tablet (81 mg total) by mouth daily. 03/19/15  Yes Burgess Estelle, MD  carvedilol (COREG) 6.25 MG tablet Take 1 tablet (6.25 mg total) by mouth 2 (two) times daily. 03/19/15  Yes Burgess Estelle, MD  colchicine 0.6 MG tablet Take 1 tablet (0.6 mg total) by mouth 2 (two) times daily. For gout flare 06/19/15 06/18/16 Yes Lucious Groves, DO  cyclobenzaprine (FLEXERIL) 10 MG tablet Take 1 tablet (10 mg total) by mouth 2 (two) times daily as needed for muscle spasms. 06/14/15  Yes Abigail Harris, PA-C  diphenhydramine-acetaminophen (TYLENOL PM) 25-500 MG TABS tablet Take 1-2 tablets by mouth 2  (two) times daily as needed (pain).   Yes Historical Provider, MD  glipiZIDE (GLUCOTROL XL) 5 MG 24 hr tablet Take 1 tablet (5 mg total) by mouth daily with breakfast. 03/19/15  Yes Burgess Estelle, MD  lisinopril (PRINIVIL,ZESTRIL) 10 MG tablet Take 1 tablet (10 mg total) by mouth daily. 06/19/15 06/18/16 Yes Lucious Groves, DO  metFORMIN (GLUCOPHAGE XR) 500 MG 24 hr tablet Take 1 tablet (500 mg total) by mouth daily with breakfast. 06/19/15 06/18/16 Yes Lucious Groves, DO  naproxen (NAPROSYN) 375 MG tablet Take 1 tablet (375 mg total) by mouth 2 (two) times daily. Patient taking differently: Take 375 mg by mouth 2 (two) times daily as needed for mild pain.  06/14/15  Yes Margarita Mail, PA-C  traMADol (ULTRAM) 50 MG tablet Take 1 tablet (50 mg total) by mouth every 6 (six) hours as needed. Patient taking differently: Take 50 mg by mouth every 6 (six) hours as needed for moderate pain.  06/14/15  Yes Margarita Mail, PA-C  allopurinol (ZYLOPRIM) 100 MG tablet Take 1 tablet (100 mg total) by mouth daily. Patient not taking: Reported on 07/09/2015 03/19/15   Burgess Estelle, MD  azithromycin (ZITHROMAX) 250 MG tablet Take 1 tablet (250 mg total) by mouth daily. Take first 2 tablets together, then 1 every day until finished. Patient not taking: Reported on 07/09/2015 04/21/15   Deno Etienne, DO  Blood Glucose Monitoring Suppl (ONE TOUCH ULTRA 2) W/DEVICE KIT Check blood sugar twice daily as instructed dx code 250.00 07/10/13   Jessee Avers, MD  glucose blood (ONE TOUCH ULTRA TEST) test strip Check blood sugar twice daily as instructed dx code 250.00 03/19/15   Burgess Estelle, MD  Lancet Devices MISC 1 kit by Does not apply route 3 (three) times daily. 06/29/12   Jessee Avers, MD  [facility] DELICA LANCETS FINE MISC Check blood sugar twice daily as instructed dx code 250.00 07/10/13   Jessee Avers, MD   BP 119/58 mmHg  Pulse 100  Temp(Src) 98.7 F (37.1 C) (Oral)  Resp 20  SpO2 98% Physical Exam  Constitutional:  He is oriented to person, place, and time. He appears well-developed and well-nourished. No distress.  HENT:  Mouth/Throat: Oropharynx is clear and moist.  Eyes: Pupils are equal, round, and reactive to light.  Neck: Normal range of motion.  Cardiovascular: Normal heart sounds.  Tachycardia present.   Pulmonary/Chest: Effort normal. No respiratory distress. He has no wheezes. He has no rales. He exhibits no tenderness.  Abdominal: Soft.  Musculoskeletal: Normal range of motion.  Clubfeet with multiple surgeries  Neurological: He is alert and oriented to person, place, and time.  Skin: Skin is warm. He is not diaphoretic.  Nursing note and vitals reviewed.   ED Course  Procedures (including critical care time)  Labs Review Labs Reviewed  BASIC METABOLIC PANEL - Abnormal; Notable for the following:    Glucose, Bld 130 (*)    Creatinine, Ser 1.26 (*)    All other components within normal limits  CBC - Abnormal; Notable for the following:    RBC 6.07 (*)    MCV 68.5 (*)    MCH 22.1 (*)    All other components within normal limits  URINALYSIS, ROUTINE W REFLEX MICROSCOPIC (NOT AT [facility]) - Abnormal; Notable for the following:    APPearance CLOUDY (*)    All other components within normal limits  CULTURE, BLOOD (ROUTINE X 2)  CULTURE, BLOOD (ROUTINE X 2)  URINE CULTURE  INFLUENZA PANEL BY PCR (TYPE A & B, H1N1)  TROPONIN I  TROPONIN I  TROPONIN I  I-STAT TROPOININ, ED  I-STAT CG4 LACTIC ACID, ED  I-STAT TROPOININ, ED  I-STAT CG4 LACTIC ACID, ED    Imaging Review Dg Chest 2 View  07/09/2015  CLINICAL DATA:  Shortness of breath and fever EXAM: CHEST  2 VIEW COMPARISON:  04/21/2015 FINDINGS: Cardiac shadow is within normal limits. The lungs are well aerated bilaterally. No acute bony abnormality is noted. Postsurgical changes in the cervical spine are seen. IMPRESSION: No acute abnormality noted. Electronically Signed   By: Inez Catalina M.D.   On: 07/09/2015 20:36   I have  personally reviewed and evaluated these images and lab results as part of my medical decision-making.   EKG Interpretation None    Patient's labs and x-ray, reviewed all within normal parameters.  I do believe this patient has influenza.  I've requested a PCR flu panel and started Tamiflu by mouth he is also received IV fluids and will be admitted  MDM   Final diagnoses:  Influenza         Junius Creamer, NP 07/09/15 0271  Lacretia Leigh, MD 07/12/15 2209

## 2015-07-09 NOTE — ED Notes (Signed)
NP at bedside.

## 2015-07-10 DIAGNOSIS — I2510 Atherosclerotic heart disease of native coronary artery without angina pectoris: Secondary | ICD-10-CM | POA: Diagnosis not present

## 2015-07-10 DIAGNOSIS — I10 Essential (primary) hypertension: Secondary | ICD-10-CM | POA: Diagnosis not present

## 2015-07-10 DIAGNOSIS — R079 Chest pain, unspecified: Secondary | ICD-10-CM | POA: Diagnosis not present

## 2015-07-10 DIAGNOSIS — N179 Acute kidney failure, unspecified: Secondary | ICD-10-CM

## 2015-07-10 DIAGNOSIS — I252 Old myocardial infarction: Secondary | ICD-10-CM

## 2015-07-10 DIAGNOSIS — J101 Influenza due to other identified influenza virus with other respiratory manifestations: Secondary | ICD-10-CM

## 2015-07-10 DIAGNOSIS — E119 Type 2 diabetes mellitus without complications: Secondary | ICD-10-CM | POA: Diagnosis not present

## 2015-07-10 DIAGNOSIS — B9789 Other viral agents as the cause of diseases classified elsewhere: Secondary | ICD-10-CM

## 2015-07-10 DIAGNOSIS — J09X2 Influenza due to identified novel influenza A virus with other respiratory manifestations: Secondary | ICD-10-CM | POA: Diagnosis not present

## 2015-07-10 DIAGNOSIS — J111 Influenza due to unidentified influenza virus with other respiratory manifestations: Secondary | ICD-10-CM | POA: Diagnosis present

## 2015-07-10 DIAGNOSIS — J069 Acute upper respiratory infection, unspecified: Secondary | ICD-10-CM

## 2015-07-10 LAB — BASIC METABOLIC PANEL
ANION GAP: 14 (ref 5–15)
BUN: 12 mg/dL (ref 6–20)
CHLORIDE: 100 mmol/L — AB (ref 101–111)
CO2: 22 mmol/L (ref 22–32)
Calcium: 9.1 mg/dL (ref 8.9–10.3)
Creatinine, Ser: 1.06 mg/dL (ref 0.61–1.24)
GFR calc non Af Amer: >60 mL/min (ref >60)
GLUCOSE: 137 mg/dL — AB (ref 65–99)
POTASSIUM: 3.8 mmol/L (ref 3.5–5.1)
Sodium: 136 mmol/L (ref 135–145)

## 2015-07-10 LAB — GLUCOSE, CAPILLARY
Glucose-Capillary: 160 mg/dL — ABNORMAL HIGH (ref 65–99)
Glucose-Capillary: 154 mg/dL — ABNORMAL HIGH (ref 65–99)

## 2015-07-10 LAB — RAPID URINE DRUG SCREEN, HOSP PERFORMED
AMPHETAMINES: NOT DETECTED
BENZODIAZEPINES: NOT DETECTED
Barbiturates: NOT DETECTED
COCAINE: NOT DETECTED
OPIATES: NOT DETECTED
TETRAHYDROCANNABINOL: NOT DETECTED

## 2015-07-10 LAB — TROPONIN I
Troponin I: <0.03 ng/mL (ref <0.031)
Troponin I: <0.03 ng/mL (ref <0.031)
Troponin I: <0.03 ng/mL (ref <0.031)

## 2015-07-10 LAB — INFLUENZA PANEL BY PCR (TYPE A & B)
H1N1 flu by pcr: NOT DETECTED
Influenza A By PCR: POSITIVE — AB
Influenza B By PCR: NEGATIVE

## 2015-07-10 MED ORDER — COLCHICINE 0.6 MG PO TABS
0.6000 mg | ORAL_TABLET | Freq: Two times a day (BID) | ORAL | Status: DC
  Administered 2015-07-10: 0.6 mg via ORAL
  Filled 2015-07-10: qty 1

## 2015-07-10 MED ORDER — LISINOPRIL 10 MG PO TABS: 10.0000 mg | ORAL_TABLET | Freq: Every day | ORAL | Status: DC

## 2015-07-10 MED ORDER — SODIUM CHLORIDE 0.9% FLUSH
3.0000 mL | Freq: Two times a day (BID) | INTRAVENOUS | Status: DC
  Administered 2015-07-10: 3 mL via INTRAVENOUS

## 2015-07-10 MED ORDER — ACETAMINOPHEN 325 MG PO TABS
650.0000 mg | ORAL_TABLET | Freq: Four times a day (QID) | ORAL | Status: DC | PRN
  Administered 2015-07-10: 650 mg via ORAL
  Filled 2015-07-10: qty 2

## 2015-07-10 MED ORDER — SODIUM CHLORIDE 0.9 % IV BOLUS (SEPSIS)
1000.0000 mL | Freq: Once | INTRAVENOUS | Status: AC
  Administered 2015-07-10: 1000 mL via INTRAVENOUS

## 2015-07-10 MED ORDER — OSELTAMIVIR PHOSPHATE 75 MG PO CAPS
75.0000 mg | ORAL_CAPSULE | Freq: Two times a day (BID) | ORAL | Status: DC
  Administered 2015-07-10: 75 mg via ORAL
  Filled 2015-07-10: qty 1

## 2015-07-10 MED ORDER — CARVEDILOL 6.25 MG PO TABS
6.2500 mg | ORAL_TABLET | Freq: Two times a day (BID) | ORAL | Status: DC
  Administered 2015-07-10: 6.25 mg via ORAL
  Filled 2015-07-10: qty 1

## 2015-07-10 MED ORDER — ENOXAPARIN SODIUM 40 MG/0.4ML ~~LOC~~ SOLN
40.0000 mg | Freq: Every day | SUBCUTANEOUS | Status: DC
  Administered 2015-07-10: 40 mg via SUBCUTANEOUS
  Filled 2015-07-10: qty 0.4

## 2015-07-10 MED ORDER — IBUPROFEN 600 MG PO TABS
600.0000 mg | ORAL_TABLET | Freq: Once | ORAL | Status: AC
  Administered 2015-07-10: 600 mg via ORAL
  Filled 2015-07-10: qty 1

## 2015-07-10 MED ORDER — SODIUM CHLORIDE 0.9% FLUSH: 3.0000 mL | INTRAVENOUS | Status: DC | PRN

## 2015-07-10 MED ORDER — ASPIRIN EC 81 MG PO TBEC
81.0000 mg | DELAYED_RELEASE_TABLET | Freq: Every day | ORAL | Status: DC
  Administered 2015-07-10: 81 mg via ORAL
  Filled 2015-07-10: qty 1

## 2015-07-10 MED ORDER — INSULIN ASPART 100 UNIT/ML ~~LOC~~ SOLN
0.0000 [IU] | Freq: Three times a day (TID) | SUBCUTANEOUS | Status: DC
  Administered 2015-07-10 (×2): 2 [IU] via SUBCUTANEOUS

## 2015-07-10 MED ORDER — GUAIFENESIN ER 600 MG PO TB12: 600.0000 mg | ORAL_TABLET | Freq: Two times a day (BID) | ORAL | Status: DC | PRN

## 2015-07-10 MED ORDER — OSELTAMIVIR PHOSPHATE 75 MG PO CAPS: 75.0000 mg | ORAL_CAPSULE | Freq: Two times a day (BID) | ORAL | Status: DC

## 2015-07-10 MED ORDER — SODIUM CHLORIDE 0.9 % IV SOLN
INTRAVENOUS | Status: AC
Start: 2015-07-10 — End: 2015-07-10
  Administered 2015-07-10: 03:00:00 via INTRAVENOUS

## 2015-07-10 MED ORDER — LISINOPRIL 10 MG PO TABS
10.0000 mg | ORAL_TABLET | Freq: Every day | ORAL | Status: DC
  Administered 2015-07-10: 10 mg via ORAL
  Filled 2015-07-10: qty 1

## 2015-07-10 NOTE — Progress Notes (Signed)
Pt has orders to be discharged. Discharge instructions given and pt has no additional questions at this time. Medication regimen reviewed and pt educated. Pt verbalized understanding and has no additional questions. Telemetry box removed. IV removed and site in good condition. Pt stable and waiting for transportation.   Jessie Schrieber RN 

## 2015-07-10 NOTE — ED Notes (Signed)
2nd attempt to call report to floor; RN to call back  

## 2015-07-10 NOTE — ED Notes (Signed)
RN attempted to call report to floor; RN to call back  

## 2015-07-10 NOTE — Care Management Obs Status (Signed)
MEDICARE OBSERVATION STATUS NOTIFICATION   Patient Details  Name: Daniel Perkins MRN: [id number] Date of Birth: 12/14/1955   Medicare Observation Status Notification Given:  No, pt hospitalized less than 18 hours.     Yvone NeuCrutchfield, Sokhna Christoph M, RN 07/10/2015, 3:02 PM

## 2015-07-10 NOTE — Progress Notes (Signed)
Subjective: NAEON. Patient reports improved breathing and cough. However, he has continued dizziness with standing and he has a HA. At home, he takes tylenol for HA but it has not helped today.  Objective: Vital signs in last 24 hours: Filed Vitals:   07/10/15 0237 07/10/15 0241 07/10/15 0248 07/10/15 0801  BP:  165/98 119/61 139/76  Pulse:  100  95  Temp:  100.6 F (38.1 C)  98.6 F (37 C)  TempSrc:  Oral  Oral  Resp:  20  20  Height: 5\' 8"  (1.727 m)     Weight: 236 lb 6.4 oz (107.23 kg)     SpO2:   96% 96%   Weight change:   Intake/Output Summary (Last 24 hours) at 07/10/15 0846 Last data filed at 07/10/15 0801  Gross per 24 hour  Intake    420 ml  Output    725 ml  Net   -305 ml   Physical Exam  Constitutional: He is oriented to person, place, and time and well-developed, well-nourished, and in no distress. No distress.  HENT:  Head: Normocephalic and atraumatic.  Eyes: EOM are normal. No scleral icterus.  Neck: No JVD present. No tracheal deviation present.  Cardiovascular: Regular rhythm and normal heart sounds.   Borderline tachycardic.  Pulmonary/Chest: Effort normal and breath sounds normal. No stridor. No respiratory distress. He has no wheezes.  No crackles.  Abdominal: He exhibits no distension. There is no rebound and no guarding.  Minimally tender to deep palpation in LLQ.  Musculoskeletal: He exhibits no edema.  Neurological: He is alert and oriented to person, place, and time.  Skin: Skin is warm and dry. He is not diaphoretic.    Lab Results: Basic Metabolic Panel:  Recent Labs Lab 07/09/15 2015 07/10/15 0510  NA 137 136  K 4.7 3.8  CL 102 100*  CO2 25 22  GLUCOSE 130* 137*  BUN 13 12  CREATININE 1.26* 1.06  CALCIUM 9.5 9.1   Liver Function Tests: No results for input(s): AST, ALT, ALKPHOS, BILITOT, PROT, ALBUMIN in the last 168 hours. No results for input(s): LIPASE, AMYLASE in the last 168 hours. No results for input(s): AMMONIA in  the last 168 hours. CBC:  Recent Labs Lab 07/09/15 2015  WBC 10.5  HGB 13.4  HCT 41.6  MCV 68.5*  PLT 270   Cardiac Enzymes:  Recent Labs Lab 07/09/15 2300 07/10/15 0510  TROPONINI <0.03 <0.03   BNP: No results for input(s): PROBNP in the last 168 hours. D-Dimer: No results for input(s): DDIMER in the last 168 hours. CBG:  Recent Labs Lab 07/10/15 0531  GLUCAP 160*   Hemoglobin A1C: No results for input(s): HGBA1C in the last 168 hours. Fasting Lipid Panel: No results for input(s): CHOL, HDL, LDLCALC, TRIG, CHOLHDL, LDLDIRECT in the last 168 hours. Thyroid Function Tests: No results for input(s): TSH, T4TOTAL, FREET4, T3FREE, THYROIDAB in the last 168 hours. Coagulation: No results for input(s): LABPROT, INR in the last 168 hours. Anemia Panel: No results for input(s): VITAMINB12, FOLATE, FERRITIN, TIBC, IRON, RETICCTPCT in the last 168 hours. Urine Drug Screen: Drugs of Abuse     Component Value Date/Time   LABOPIA NONE DETECTED 07/09/2015 0324   COCAINSCRNUR NONE DETECTED 07/09/2015 0324   LABBENZ NONE DETECTED 07/09/2015 0324   AMPHETMU NONE DETECTED 07/09/2015 0324   THCU NONE DETECTED 07/09/2015 0324   LABBARB NONE DETECTED 07/09/2015 0324    Alcohol Level: No results for input(s): ETH in the last 168 hours.  Urinalysis:  Recent Labs Lab 07/09/15 2219  COLORURINE YELLOW  LABSPEC 1.015  PHURINE 7.0  GLUCOSEU NEGATIVE  HGBUR NEGATIVE  BILIRUBINUR NEGATIVE  KETONESUR NEGATIVE  PROTEINUR NEGATIVE  NITRITE NEGATIVE  LEUKOCYTESUR NEGATIVE   Misc. Labs:   Micro Results: No results found for this or any previous visit (from the past 240 hour(s)). Studies/Results: Dg Chest 2 View  07/09/2015  CLINICAL DATA:  Shortness of breath and fever EXAM: CHEST  2 VIEW COMPARISON:  04/21/2015 FINDINGS: Cardiac shadow is within normal limits. The lungs are well aerated bilaterally. No acute bony abnormality is noted. Postsurgical changes in the cervical spine  are seen. IMPRESSION: No acute abnormality noted. Electronically Signed   By: Alcide Clever M.D.   On: 07/09/2015 20:36   Medications: I have reviewed the patient's current medications. Scheduled Meds: . aspirin EC  81 mg Oral Daily  . carvedilol  6.25 mg Oral BID  . colchicine  0.6 mg Oral BID  . enoxaparin (LOVENOX) injection  40 mg Subcutaneous Daily  . ibuprofen  600 mg Oral Once  . insulin aspart  0-9 Units Subcutaneous TID WC  . oseltamivir  75 mg Oral BID  . sodium chloride  1,000 mL Intravenous Once  . sodium chloride flush  3 mL Intravenous Q12H   Continuous Infusions: . sodium chloride 100 mL/hr at 07/10/15 0248   PRN Meds:.acetaminophen, guaiFENesin, sodium chloride flush Assessment/Plan: Principal Problem:   Influenza Active Problems:   Diabetes (HCC)   HYPERTENSION   CAD (coronary artery disease)   Gout   Chest pain   OSA (obstructive sleep apnea)   AKI (acute kidney injury) (HCC)  Influenza A Pneumonia: Patient has been afebrile since admission. Flu A positive.  Patient improving with IVF.  Will continue fluids and recommended increased PO intake to prevent dehydration. - Tamiflu 75 mg BID x5 days  Orthostatic vitals - Mucinex prn  BCx pending - NS 100 mL/hr + 1L bolus  Chest Pain with history of nonobstructive CAD: Patient with a history of an MI in 2007, cath in 2011 revealed mild non-obstructive CAD. Nuclear stress test done 11/2014 showed EF of 62% with T wave inversion noted during the stress test and small defect in the basal inferolateral and mild inferolateral region. Overall, the study was low risk. Troponins negative so far. Will finish trend, but chest pain/soreness appears to be more due to flu. -ASA 81 mg daily -Coreg 6.25 mg bid  DM: A1c 8.3 06/19/15. Patient supposed to be taking Metformin 500 ER mg daily and Glipizide 5 mg daily. Reports he does not take the metformin due to diarrhea.  -SSI-sensitive  HTN: BP elevated to 170s systolic on  arrival to ED. Improved to 120s systolic. Takes lisinopril 10 mg daily and coreg 6.25 mg bid at home.  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 Has been present for 1.5 months. Denies any trauma or any needle injections. Was seen in clinic on 2/10 with [place] ordered but never done.   Gout: Patient reports he was told to stop Allopurinol several weeks ago and has been taking colchicine daily.  -Continue home dose colchicine  OSA: Patient with STOP BANG score of 7 indicative of high risk for OSA noted prior to previous surgery. Was referred for sleep study but was never done. Does not use CPAP at night.   DVT PPx: Lovenox   Dispo: Disposition is deferred at this  time, awaiting improvement of current medical problems.  Anticipated discharge in approximately 0 day(s).   The patient does have a current PCP Deneise Lever, MD) and does need an [facility] hospital follow-up appointment after discharge.  The patient does not have transportation limitations that hinder transportation to clinic appointments.  .Services Needed at time of discharge: Y = Yes, Blank = No PT:   OT:   RN:   Equipment:   Other:       Jana Half, MD 07/10/2015, 8:46 AM

## 2015-07-10 NOTE — Progress Notes (Signed)
Internal Medicine Attending  Date: 07/10/2015  Patient name: Daniel Perkins Medical record number: [id number] Date of birth: 02/25/1956 Age: 60 y.o. Gender: male  I saw and evaluated the patient. I reviewed the resident's note by Dr. Ladona Ridgelaylor and I agree with the resident's findings and plans as documented in his progress note.  Please see my H&P dated 07/10/2015 and attached to Dr. Bonney RousselBoswell's H&P dated 07/09/2015 for the specifics of my evaluation, assessment, and plan from earlier today.
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
## 2015-07-10 NOTE — Discharge Instructions (Signed)
1. Take Tamiflu 75 mg (1 tab) twice daily until completed.  2. Drink lots of fluids to stay hydrated.   Influenza, Adult Influenza ("the flu") is a viral infection of the respiratory tract. It occurs more often in winter months because people spend more time in close contact with one another. Influenza can make you feel very sick. Influenza easily spreads from person to person (contagious). CAUSES  Influenza is caused by a virus that infects the respiratory tract. You can catch the virus by breathing in droplets from an infected person's cough or sneeze. You can also catch the virus by touching something that was recently contaminated with the virus and then touching your mouth, nose, or eyes. RISKS AND COMPLICATIONS You may be at risk for a more severe case of influenza if you smoke cigarettes, have diabetes, have chronic heart disease (such as heart failure) or lung disease (such as asthma), or if you have a weakened immune system. Elderly people and pregnant women are also at risk for more serious infections. The most common problem of influenza is a lung infection (pneumonia). Sometimes, this problem can require emergency medical care and may be life threatening. SIGNS AND SYMPTOMS  Symptoms typically last 4 to 10 days and may include:  Fever.  Chills.  Headache, body aches, and muscle aches.  Sore throat.  Chest discomfort and cough.  Poor appetite.  Weakness or feeling tired.  Dizziness.  Nausea or vomiting. DIAGNOSIS  Diagnosis of influenza is often made based on your history and a physical exam. A nose or throat swab test can be done to confirm the diagnosis. TREATMENT  In mild cases, influenza goes away on its own. Treatment is directed at relieving symptoms. For more severe cases, your health care provider may prescribe antiviral medicines to shorten the sickness. Antibiotic medicines are not effective because the infection is caused by a virus, not by bacteria. HOME CARE  INSTRUCTIONS  Take medicines only as directed by your health care provider.  Use a cool mist humidifier to make breathing easier.  Get plenty of rest until your temperature returns to normal. This usually takes 3 to 4 days.  Drink enough fluid to keep your urine clear or pale yellow.  Cover yourmouth and nosewhen coughing or sneezing,and wash your handswellto prevent thevirusfrom spreading.  Stay homefromwork orschool untilthe fever is gonefor at least 661full day. PREVENTION  An annual influenza vaccination (flu shot) is the best way to avoid getting influenza. An annual flu shot is now routinely recommended for all adults in the [place] SEEK MEDICAL CARE IF:  You experiencechest pain, yourcough worsens,or you producemore mucus.  Youhave nausea,vomiting, ordiarrhea.  Your fever returns or gets worse. SEEK IMMEDIATE MEDICAL CARE IF:  You havetrouble breathing, you become short of breath,or your skin ornails becomebluish.  You have severe painor stiffnessin the neck.  You develop a sudden headache, or pain in the face or ear.  You have nausea or vomiting that you cannot control. MAKE SURE YOU:   Understand these instructions.  Will watch your condition.  Will get help right away if you are not doing well or get worse.   This information is not intended to replace advice given to you by your health care provider. Make sure you discuss any questions you have with your health care provider.   Document Released: 04/22/2000 Document Revised: 05/16/2014 Document Reviewed: 07/25/2011 Elsevier Interactive Patient Education [employer]2016 [employer] Inc.

## 2015-07-11 LAB — URINE CULTURE: CULTURE: NO GROWTH

## 2015-07-13 DIAGNOSIS — M6701 Short Achilles tendon (acquired), right ankle: Secondary | ICD-10-CM | POA: Diagnosis not present

## 2015-07-13 DIAGNOSIS — M1009 Idiopathic gout, multiple sites: Secondary | ICD-10-CM | POA: Diagnosis not present

## 2015-07-13 DIAGNOSIS — M79671 Pain in right foot: Secondary | ICD-10-CM | POA: Diagnosis not present

## 2015-07-14 LAB — CULTURE, BLOOD (ROUTINE X 2)
CULTURE: NO GROWTH
Culture: NO GROWTH

## 2015-07-24 ENCOUNTER — Encounter: Payer: Self-pay | Admitting: Internal Medicine

## 2015-07-24 ENCOUNTER — Ambulatory Visit (INDEPENDENT_AMBULATORY_CARE_PROVIDER_SITE_OTHER): Payer: Medicare Other | Admitting: Internal Medicine

## 2015-07-24 VITALS — BP 157/91 | HR 86 | Temp 97.6°F | Ht 67.0 in | Wt 242.1 lb

## 2015-07-24 DIAGNOSIS — E1165 Type 2 diabetes mellitus with hyperglycemia: Secondary | ICD-10-CM | POA: Diagnosis not present

## 2015-07-24 DIAGNOSIS — Z7984 Long term (current) use of oral hypoglycemic drugs: Secondary | ICD-10-CM | POA: Diagnosis not present

## 2015-07-24 DIAGNOSIS — E119 Type 2 diabetes mellitus without complications: Secondary | ICD-10-CM

## 2015-07-24 DIAGNOSIS — R2232 Localized swelling, mass and lump, left upper limb: Secondary | ICD-10-CM | POA: Diagnosis not present

## 2015-07-24 DIAGNOSIS — I10 Essential (primary) hypertension: Secondary | ICD-10-CM

## 2015-07-24 DIAGNOSIS — Z9119 Patient's noncompliance with other medical treatment and regimen: Secondary | ICD-10-CM | POA: Diagnosis not present

## 2015-07-24 DIAGNOSIS — J111 Influenza due to unidentified influenza virus with other respiratory manifestations: Secondary | ICD-10-CM

## 2015-07-24 LAB — GLUCOSE, CAPILLARY: GLUCOSE-CAPILLARY: 179 mg/dL — AB (ref 65–99)

## 2015-07-24 MED ORDER — GLIPIZIDE ER 5 MG PO TB24: 5.0000 mg | ORAL_TABLET | Freq: Every day | ORAL | Status: DC

## 2015-07-24 MED ORDER — LISINOPRIL 20 MG PO TABS: 20.0000 mg | ORAL_TABLET | Freq: Every day | ORAL | Status: DC

## 2015-07-24 NOTE — Assessment & Plan Note (Addendum)
Has not done the vascular u/s that as ordered by Dr. Mikey BussingHOffman. This could a be cyst sitting adjacent to the radial artery and causing the pulsatile nature or could be pseudo aneursym from radial artery. 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   - get the vascular u/s.

## 2015-07-24 NOTE — Assessment & Plan Note (Signed)
ot taking metformin due to diarrhea. Ran out of glipizide. Not checking his sugar at home. CBG here in 170's.  - asked him to pick up metformin XR from his pharmacy. - refilled glipizide. - asked to check sugar 3 x daily and f/up with his meter in 2 months.

## 2015-07-24 NOTE — Progress Notes (Signed)
   Subjective:    Patient ID: Daniel Perkins, male    DOB: 03/27/1956, 60 y.o.   MRN: [id number]  HPI  60 yo male with DM II, HTN, HLD, gout, CAD, PVD, was admitted on 07/09/15 to 07/10/15 for flu like symptoms (influenza A+) here for hospital follow up.  Was given tamiflu for 5 days in the hospital along with ibuprofen for myalgia and headaches. Was aggressively hydrated.   Influenza: doing well. Finished tamiflu. No symptoms other than some residual cough.  DM II : not taking metformin due to diarrhea. Ran out of glipizide. Not checking his sugar at home. I told him he has metformin extended release ordered which he should try since he was still taking regular metformin.   HTN: BP elevated on lisinopril 10mg  + coreg 6.25 bid. States compliance with meds.  Left wrist mass - on top of radial artery. Sometimes gets bigger and causes pain then regresses spontaneously. Has been there for few months. No injuries. Vascular u/s was ordered but never was done.     Review of Systems  Constitutional: Negative for fever, chills and fatigue.  HENT: Negative for congestion and sore throat.   Eyes: Negative for photophobia and visual disturbance.  Respiratory: Positive for cough. Negative for choking, shortness of breath and wheezing.   Cardiovascular: Negative for chest pain, palpitations and leg swelling.  Gastrointestinal: Negative for abdominal pain and abdominal distention.  Endocrine: Negative.   Genitourinary: Negative for dysuria and hematuria.  Musculoskeletal: Negative for back pain and arthralgias.  Skin: Negative.   Neurological: Negative for dizziness and headaches.  Psychiatric/Behavioral: Negative.        Objective:   Physical Exam  Constitutional: He is oriented to person, place, and time. He appears well-developed and well-nourished. No distress.  HENT:  Head: Normocephalic and atraumatic.  Mouth/Throat: Oropharynx is clear and moist. No oropharyngeal exudate.  Eyes:  Conjunctivae and EOM are normal. Pupils are equal, round, and reactive to light. Right eye exhibits no discharge. Left eye exhibits no discharge. No scleral icterus.  Neck: Neck supple. No thyromegaly present.  Cardiovascular: Normal rate, regular rhythm, S1 normal, S2 normal and normal heart sounds.  Exam reveals no gallop and no friction rub.   No murmur heard. Pulmonary/Chest: Effort normal and breath sounds normal. No respiratory distress. He has no wheezes. He has no rales. He exhibits no tenderness.  Abdominal: Soft. Bowel sounds are normal. He exhibits no distension and no mass. There is no tenderness. There is no rebound and no guarding.  Musculoskeletal: Normal range of motion. He exhibits no edema or tenderness.  Has ~1cm rounded mass on his left wrist adjacent to his radial artery. It's soft and pulsatile. No skin opening, redness, warmth. Non tender.   Lymphadenopathy:    He has no cervical adenopathy.  Neurological: He is alert and oriented to person, place, and time. He has normal strength and normal reflexes. No cranial nerve deficit or sensory deficit.  Skin: No rash noted. He is not diaphoretic. No erythema. No pallor.  Psychiatric: He has a normal mood and affect.     Filed Vitals:   07/24/15 0916  BP: 157/91  Pulse: 86  Temp: 97.6 F (36.4 C)        Assessment & Plan:  See problem based a&p.

## 2015-07-24 NOTE — Patient Instructions (Signed)
Please check your sugar daily at least 3x.  Bring your meter with you.  Pick up your medications (metformin extended release and glipizide from your pharmacy).  increased your lisinopril to 20mg  daily.

## 2015-07-28 ENCOUNTER — Ambulatory Visit (HOSPITAL_COMMUNITY): Admission: RE | Admit: 2015-07-28 | Payer: Medicare Other | Source: Ambulatory Visit

## 2015-07-29 ENCOUNTER — Ambulatory Visit (HOSPITAL_COMMUNITY)
Admission: RE | Admit: 2015-07-29 | Discharge: 2015-07-29 | Disposition: A | Discharge status: Home / Self Care | Payer: Medicare Other | Source: Ambulatory Visit | Attending: Internal Medicine | Admitting: Internal Medicine

## 2015-07-29 DIAGNOSIS — R2232 Localized swelling, mass and lump, left upper limb: Secondary | ICD-10-CM | POA: Diagnosis not present

## 2015-07-29 NOTE — Progress Notes (Addendum)
*  PRELIMINARY RESULTS* Vascular Ultrasound Left upper extremity arterial duplex limited has been completed.  Preliminary findings: Left radial artery within normal limits without pseudoaneurysm. At area of knot, lateral wrist, there is a solid lesion. Etiology unknown.    Farrel DemarkJill Eunice, RDMS, RVT  07/29/2015, 3:03 PM

## 2015-07-30 NOTE — Progress Notes (Signed)
Internal Medicine Clinic Attending  Case discussed with Dr. Ahmed at the time of the visit.  We reviewed the resident's history and exam and pertinent patient test results.  I agree with the assessment, diagnosis, and plan of care documented in the resident's note. 

## 2015-08-03 NOTE — Addendum Note (Signed)
Addended by: Carlynn PurlHOFFMAN, Kirah Stice C on: 08/03/2015 09:50 AM   Modules accepted: Orders

## 2015-08-07 ENCOUNTER — Ambulatory Visit: Payer: Self-pay | Admitting: Dietician

## 2015-08-23 ENCOUNTER — Emergency Department (HOSPITAL_COMMUNITY)
Admission: EM | Admit: 2015-08-23 | Discharge: 2015-08-23 | Disposition: A | Discharge status: Home / Self Care | Payer: Medicare Other | Attending: Emergency Medicine | Admitting: Emergency Medicine

## 2015-08-23 ENCOUNTER — Encounter (HOSPITAL_COMMUNITY): Payer: Self-pay | Admitting: *Deleted

## 2015-08-23 DIAGNOSIS — E119 Type 2 diabetes mellitus without complications: Secondary | ICD-10-CM | POA: Diagnosis not present

## 2015-08-23 DIAGNOSIS — Q660 Congenital talipes equinovarus: Secondary | ICD-10-CM | POA: Insufficient documentation

## 2015-08-23 DIAGNOSIS — I252 Old myocardial infarction: Secondary | ICD-10-CM | POA: Insufficient documentation

## 2015-08-23 DIAGNOSIS — I10 Essential (primary) hypertension: Secondary | ICD-10-CM | POA: Insufficient documentation

## 2015-08-23 DIAGNOSIS — I2510 Atherosclerotic heart disease of native coronary artery without angina pectoris: Secondary | ICD-10-CM | POA: Insufficient documentation

## 2015-08-23 DIAGNOSIS — R1111 Vomiting without nausea: Secondary | ICD-10-CM | POA: Diagnosis not present

## 2015-08-23 DIAGNOSIS — R197 Diarrhea, unspecified: Secondary | ICD-10-CM | POA: Diagnosis not present

## 2015-08-23 DIAGNOSIS — R1110 Vomiting, unspecified: Secondary | ICD-10-CM | POA: Insufficient documentation

## 2015-08-23 NOTE — ED Notes (Signed)
Pt has been called twice per NT Erica in triage, I have called for this patient myself and gone outside to call for this patient.

## 2015-08-23 NOTE — ED Notes (Signed)
Per EMS, pt reports vomiting 2 hours after eating last night, then vomited again x 1 today after eating while in church.  Pt's BP was elevated prior to EMS arrival but EMS reports normal BP upon arrival on scene.

## 2015-09-28 ENCOUNTER — Emergency Department (HOSPITAL_COMMUNITY): Payer: Medicare Other

## 2015-09-28 ENCOUNTER — Emergency Department (HOSPITAL_COMMUNITY)
Admission: EM | Admit: 2015-09-28 | Discharge: 2015-09-28 | Disposition: A | Discharge status: Home / Self Care | Payer: Medicare Other | Attending: Emergency Medicine | Admitting: Emergency Medicine

## 2015-09-28 ENCOUNTER — Encounter (HOSPITAL_COMMUNITY): Payer: Self-pay | Admitting: Emergency Medicine

## 2015-09-28 DIAGNOSIS — E785 Hyperlipidemia, unspecified: Secondary | ICD-10-CM | POA: Insufficient documentation

## 2015-09-28 DIAGNOSIS — M25532 Pain in left wrist: Secondary | ICD-10-CM | POA: Diagnosis not present

## 2015-09-28 DIAGNOSIS — R0602 Shortness of breath: Secondary | ICD-10-CM | POA: Diagnosis not present

## 2015-09-28 DIAGNOSIS — Z87891 Personal history of nicotine dependence: Secondary | ICD-10-CM | POA: Diagnosis not present

## 2015-09-28 DIAGNOSIS — I10 Essential (primary) hypertension: Secondary | ICD-10-CM | POA: Insufficient documentation

## 2015-09-28 DIAGNOSIS — Z7984 Long term (current) use of oral hypoglycemic drugs: Secondary | ICD-10-CM | POA: Insufficient documentation

## 2015-09-28 DIAGNOSIS — I2510 Atherosclerotic heart disease of native coronary artery without angina pectoris: Secondary | ICD-10-CM | POA: Insufficient documentation

## 2015-09-28 DIAGNOSIS — I252 Old myocardial infarction: Secondary | ICD-10-CM | POA: Diagnosis not present

## 2015-09-28 DIAGNOSIS — E119 Type 2 diabetes mellitus without complications: Secondary | ICD-10-CM | POA: Insufficient documentation

## 2015-09-28 DIAGNOSIS — Z7982 Long term (current) use of aspirin: Secondary | ICD-10-CM | POA: Insufficient documentation

## 2015-09-28 DIAGNOSIS — M10042 Idiopathic gout, left hand: Secondary | ICD-10-CM

## 2015-09-28 DIAGNOSIS — M19032 Primary osteoarthritis, left wrist: Secondary | ICD-10-CM | POA: Insufficient documentation

## 2015-09-28 DIAGNOSIS — M19039 Primary osteoarthritis, unspecified wrist: Secondary | ICD-10-CM

## 2015-09-28 DIAGNOSIS — M79602 Pain in left arm: Secondary | ICD-10-CM | POA: Diagnosis present

## 2015-09-28 LAB — CBC WITH DIFFERENTIAL/PLATELET
BASOS PCT: 0 %
Basophils Absolute: 0 10*3/uL (ref 0.0–0.1)
EOS ABS: 0.3 10*3/uL (ref 0.0–0.7)
Eosinophils Relative: 3 %
HCT: 37.8 % — ABNORMAL LOW (ref 39.0–52.0)
HEMOGLOBIN: 12.6 g/dL — AB (ref 13.0–17.0)
LYMPHS ABS: 2.2 10*3/uL (ref 0.7–4.0)
Lymphocytes Relative: 23 %
MCH: 22.4 pg — AB (ref 26.0–34.0)
MCHC: 33.3 g/dL (ref 30.0–36.0)
MCV: 67.3 fL — ABNORMAL LOW (ref 78.0–100.0)
Monocytes Absolute: 0.9 10*3/uL (ref 0.1–1.0)
Monocytes Relative: 9 %
NEUTROS ABS: 6.1 10*3/uL (ref 1.7–7.7)
Neutrophils Relative %: 65 %
PLATELETS: 317 10*3/uL (ref 150–400)
RBC: 5.62 MIL/uL (ref 4.22–5.81)
RDW: 14.9 % (ref 11.5–15.5)
WBC: 9.5 10*3/uL (ref 4.0–10.5)

## 2015-09-28 LAB — I-STAT TROPONIN, ED: TROPONIN I, POC: 0 ng/mL (ref 0.00–0.08)

## 2015-09-28 LAB — BASIC METABOLIC PANEL
Anion gap: 6 (ref 5–15)
BUN: 14 mg/dL (ref 6–20)
CHLORIDE: 103 mmol/L (ref 101–111)
CO2: 24 mmol/L (ref 22–32)
CREATININE: 0.99 mg/dL (ref 0.61–1.24)
Calcium: 9.1 mg/dL (ref 8.9–10.3)
GFR calc Af Amer: >60 mL/min (ref >60)
GFR calc non Af Amer: >60 mL/min (ref >60)
Glucose, Bld: 169 mg/dL — ABNORMAL HIGH (ref 65–99)
Potassium: 4.1 mmol/L (ref 3.5–5.1)
SODIUM: 133 mmol/L — AB (ref 135–145)

## 2015-09-28 LAB — D-DIMER, QUANTITATIVE: D-Dimer, Quant: 0.41 ug/mL-FEU (ref 0.00–0.50)

## 2015-09-28 MED ORDER — HYDROCODONE-ACETAMINOPHEN 5-325 MG PO TABS
1.0000 | ORAL_TABLET | ORAL | Status: AC
  Administered 2015-09-28: 1 via ORAL
  Filled 2015-09-28: qty 1

## 2015-09-28 MED ORDER — CARVEDILOL 6.25 MG PO TABS
6.2500 mg | ORAL_TABLET | Freq: Once | ORAL | Status: AC
Start: 2015-09-28 — End: 2015-09-28
  Administered 2015-09-28: 6.25 mg via ORAL
  Filled 2015-09-28: qty 1

## 2015-09-28 MED ORDER — COLCHICINE 0.6 MG PO TABS
0.6000 mg | ORAL_TABLET | Freq: Every day | ORAL | Status: DC | PRN
Start: 2015-09-28 — End: 2016-10-26

## 2015-09-28 NOTE — ED Notes (Signed)
Patient c/o left arm pain that started yesterday morning.  Patient has some swelling noted to left hand and wrist. Patient denies any injury. Patient states that he has "pain in artery in lower arm really starts to hurting when it starts to swelling and doctor said he didn't like how it was working when they ultrasound it couple months ago".

## 2015-09-28 NOTE — ED Provider Notes (Signed)
CSN: [id number]     Arrival date & time 09/28/15  0938 History   First MD Initiated Contact with Patient 09/28/15 [phone]     Chief Complaint  Patient presents with  . left arm pain    HPI Pt has had intermittent pain in his left arm.  The pain goes from the wrist to the axilla.  He has had this off and on for at least several months.  He saw his doctor several months ago for an episode and had a doppler arterial US of his left arm.  Findings were normal.  The sx resolved after a few days.  Last night he started having the pain again.  It hurts to touch his arm. It hurts to move his wrist.  He noticed swelling in his wrist.  He has had trouble with gout in the past.  He is not sure if this is related.  Past Medical History  Diagnosis Date  . Coronary artery disease     Holter monitor 02/2012 - sinus with rare PVC  . Gout   . Hyperlipidemia   . Hypertension   . Osteomyelitis ([place])   . CTEV (congenital talipes equinovarus)   . Myocardial infarction [facility]) ~ 2007    "mild"  . Type II diabetes mellitus ([place])   . Club foot of both lower extremities 1958  . Pneumonia 2000's    "couple times"   Past Surgical History  Procedure Laterality Date  . Anterior cervical decomp/discectomy fusion  X 2  . Foot surgery Left x16    "joints collapsed; keep getting infected"  . Posterior cervical fusion/foraminotomy  X 2  . Foot fracture surgery Right 1990's    "pt a steel plate in"  . Cardiac catheterization     Family History  Problem Relation Age of Onset  . Diabetes Mother   . Coronary artery disease Mother     HAS PACEMAKER  . Heart disease Mother   . Heart attack Brother    Social History  Substance Use Topics  . Smoking status: Former Smoker -- 1.00 packs/day for 32 years    Types: Cigarettes    Quit date: 07/06/2007  . Smokeless tobacco: Never Used  . Alcohol Use: 0.0 oz/week    0 Standard drinks or equivalent per week     Comment: Sometimes.    Review of Systems   Constitutional: Negative for fever.  Respiratory: Positive for shortness of breath (ongoing for two weeks, off an on, lasts 10 minutes, not currently).   Cardiovascular: Positive for chest pain (off and on, a burning, comes with the shortness of breath).  All other systems reviewed and are negative.     Allergies  Morphine and Pravastatin sodium  Home Medications   Prior to Admission medications   Medication Sig Start Date End Date Taking? Authorizing Provider  acetaminophen (TYLENOL) 500 MG tablet Take 1,000 mg by mouth every 6 (six) hours as needed for moderate pain (gout).   Yes Historical Provider, MD  allopurinol (ZYLOPRIM) 100 MG tablet Take 100 mg by mouth daily.   Yes Historical Provider, MD  aspirin EC 81 MG tablet Take 1 tablet (81 mg total) by mouth daily. 03/19/15  Yes Burgess Estelle, MD  diphenhydramine-acetaminophen (TYLENOL PM) 25-500 MG TABS tablet Take 1-2 tablets by mouth 2 (two) times daily as needed (pain).   Yes Historical Provider, MD  glipiZIDE (GLUCOTROL XL) 5 MG 24 hr tablet Take 1 tablet (5 mg total) by mouth daily with breakfast. 07/24/15  Yes Tasrif Ahmed, MD  lisinopril (PRINIVIL,ZESTRIL) 20 MG tablet Take 1 tablet (20 mg total) by mouth daily. 07/24/15 07/23/16 Yes Tasrif Ahmed, MD  naproxen (NAPROSYN) 375 MG tablet Take 1 tablet (375 mg total) by mouth 2 (two) times daily. Patient taking differently: Take 375 mg by mouth daily as needed for mild pain.  06/14/15  Yes Margarita Mail, PA-C  Blood Glucose Monitoring Suppl (ONE TOUCH ULTRA 2) W/DEVICE KIT Check blood sugar twice daily as instructed dx code 250.00 07/10/13   Jessee Avers, MD  carvedilol (COREG) 6.25 MG tablet Take 1 tablet (6.25 mg total) by mouth 2 (two) times daily. Patient not taking: Reported on 09/28/2015 03/19/15   Burgess Estelle, MD  colchicine 0.6 MG tablet Take 1 tablet (0.6 mg total) by mouth daily as needed (gout). For gout flare 09/28/15 09/27/16  Dorie Rank, MD  cyclobenzaprine (FLEXERIL) 10  MG tablet Take 1 tablet (10 mg total) by mouth 2 (two) times daily as needed for muscle spasms. Patient not taking: Reported on 09/28/2015 06/14/15   Margarita Mail, PA-C  glucose blood (ONE TOUCH ULTRA TEST) test strip Check blood sugar twice daily as instructed dx code 250.00 03/19/15   Burgess Estelle, MD  metFORMIN (GLUCOPHAGE XR) 500 MG 24 hr tablet Take 1 tablet (500 mg total) by mouth daily with breakfast. Patient not taking: Reported on 09/28/2015 06/19/15 06/18/16  Lucious Groves, DO  [facility] DELICA LANCETS FINE MISC Check blood sugar twice daily as instructed dx code 250.00 07/10/13   Jessee Avers, MD  oseltamivir (TAMIFLU) 75 MG capsule Take 1 capsule (75 mg total) by mouth 2 (two) times daily. Patient not taking: Reported on 09/28/2015 07/10/15   Iline Oven, MD  traMADol (ULTRAM) 50 MG tablet Take 1 tablet (50 mg total) by mouth every 6 (six) hours as needed. Patient not taking: Reported on 09/28/2015 06/14/15   Margarita Mail, PA-C   BP 194/102 mmHg  Pulse 87  Temp(Src) 98.1 F (36.7 C) (Oral)  Resp 19  Ht '5\' 8"'  (1.727 m)  Wt 107.502 kg  BMI 36.04 kg/m2  SpO2 96% Physical Exam  Constitutional: He appears well-developed and well-nourished. No distress.  HENT:  Head: Normocephalic and atraumatic.  Right Ear: External ear normal.  Left Ear: External ear normal.  Eyes: Conjunctivae are normal. Right eye exhibits no discharge. Left eye exhibits no discharge. No scleral icterus.  Neck: Neck supple. No tracheal deviation present.  Cardiovascular: Normal rate, regular rhythm and intact distal pulses.   Pulmonary/Chest: Effort normal and breath sounds normal. No stridor. No respiratory distress. He has no wheezes. He has no rales.  Abdominal: Soft. Bowel sounds are normal. He exhibits no distension. There is no tenderness. There is no rebound and no guarding.  Musculoskeletal: He exhibits tenderness. He exhibits no edema.       Left elbow: Normal.       Left wrist: He exhibits  decreased range of motion, tenderness, bony tenderness, swelling and effusion. He exhibits no deformity.       Left upper arm: Normal.  Neurological: He is alert. He has normal strength. No cranial nerve deficit (no facial droop, extraocular movements intact, no slurred speech) or sensory deficit. He exhibits normal muscle tone. He displays no seizure activity. Coordination normal.  Skin: Skin is warm and dry. No rash noted.  Psychiatric: He has a normal mood and affect.  Nursing note and vitals reviewed.   ED Course  Procedures  Medications  carvedilol (COREG) tablet 6.25 mg (  not administered)  HYDROcodone-acetaminophen (NORCO/VICODIN) 5-325 MG per tablet 1 tablet (1 tablet Oral Given 09/28/15 0949)    Labs Review Labs Reviewed  CBC WITH DIFFERENTIAL/PLATELET - Abnormal; Notable for the following:    Hemoglobin 12.6 (*)    HCT 37.8 (*)    MCV 67.3 (*)    MCH 22.4 (*)    All other components within normal limits  BASIC METABOLIC PANEL - Abnormal; Notable for the following:    Sodium 133 (*)    Glucose, Bld 169 (*)    All other components within normal limits  D-DIMER, QUANTITATIVE (NOT AT [facility])  Randolm Idol, ED    Imaging Review Dg Chest 2 View  09/28/2015  CLINICAL DATA:  Short of breath EXAM: CHEST  2 VIEW COMPARISON:  07/09/2015 FINDINGS: Heart size and vascularity normal. Mild right middle lobe atelectasis. Left lung is clear. No mass or effusion. IMPRESSION: Mild right middle lobe atelectasis. Electronically Signed   By: Franchot Gallo M.D.   On: 09/28/2015 10:05   Dg Wrist Complete Left  09/28/2015  CLINICAL DATA:  Left wrist pain.  No recent injury EXAM: LEFT WRIST - COMPLETE 3+ VIEW COMPARISON:  11/14/2013 FINDINGS: Moderate degenerative change in the radial carpal joint with joint space narrowing and spurring. Possible triangular fibrocartilage tear. Degenerative changes similar to the prior study Negative for acute fracture. Carpal joints appear normal. Small soft  tissue calcification medial to the radial styloid is unchanged. IMPRESSION: Moderate degenerative change in the radiocarpal joint which is chronic. No acute abnormality. Electronically Signed   By: Franchot Gallo M.D.   On: 09/28/2015 09:54   I have personally reviewed and evaluated these images and lab results as part of my medical decision-making.   EKG Interpretation   Date/Time:  Monday Sep 28 2015 09:24:14 EDT Ventricular Rate:  84 PR Interval:  153 QRS Duration: 92 QT Interval:  359 QTC Calculation: 424 R Axis:   -37 Text Interpretation:  Sinus rhythm Abnormal R-wave progression, late  transition ; noted on prior ECG Left ventricular hypertrophy Confirmed by  Katera Rybka  MD-J, Sadiya Durand ([id number]) on 09/28/2015 9:36:28 AM      MDM   Final diagnoses:  Arthritis of wrist  Essential hypertension    The patient's wrist x-ray shows degenerative changes that appear to be chronic in nature. I think this correlates with his recurrent episodes of having wrist pain and swelling. She does have a history of gout. I think this is the most likely cause. I doubt that he has an acute infection. I will discharge him home with a prescription for colchicine.  Patient also mentioned having some intermittent chest discomfort and shortness of breath over the last several weeks. Laboratory tests are reassuring. Chest x-ray does not show evidence of pneumonia. D-dimer and cardiac enzymes are negative. I doubt pain or acute coronary syndrome. I recommend he follow up with his primary care doctor. He is hypertensive and may require some adjustment of his blood pressure medications.  Will give an additional dose of coreg in the ED.    Dorie Rank, MD 09/28/15 1136

## 2015-09-28 NOTE — Discharge Instructions (Signed)
Gout Gout is when your joints become red, sore, and swell (inflamed). This is caused by the buildup of uric acid crystals in the joints. Uric acid is a chemical that is normally in the blood. If the level of uric acid gets too high in the blood, these crystals form in your joints and tissues. Over time, these crystals can form into masses near the joints and tissues. These masses can destroy bone and cause the bone to look misshapen (deformed). HOME CARE   Do not take aspirin for pain.  Only take medicine as told by your doctor.  Rest the joint as much as you can. When in bed, keep sheets and blankets off painful areas.  Keep the sore joints raised (elevated).  Put warm or cold packs on painful joints. Use of warm or cold packs depends on which works best for you.  Use crutches if the painful joint is in your leg.  Drink enough fluids to keep your pee (urine) clear or pale yellow. Limit alcohol, sugary drinks, and drinks with fructose in them.  Follow your diet instructions. Pay careful attention to how much protein you eat. Include fruits, vegetables, whole grains, and fat-free or low-fat milk products in your daily diet. Talk to your doctor or dietitian about the use of coffee, vitamin C, and cherries. These may help lower uric acid levels.  Keep a healthy body weight. GET HELP RIGHT AWAY IF:   You have watery poop (diarrhea), throw up (vomit), or have any side effects from medicines.  You do not feel better in 24 hours, or you are getting worse.  Your joint becomes suddenly more tender, and you have chills or a fever. MAKE SURE YOU:   Understand these instructions.  Will watch your condition.  Will get help right away if you are not doing well or get worse.   This information is not intended to replace advice given to you by your health care provider. Make sure you discuss any questions you have with your health care provider.   Document Released: 02/02/2008 Document Revised:  05/16/2014 Document Reviewed: 12/07/2011 Elsevier Interactive Patient Education 2016 [employer].  Hypertension Hypertension, commonly called high blood pressure, is when the force of blood pumping through your arteries is too strong. Your arteries are the blood vessels that carry blood from your heart throughout your body. A blood pressure reading consists of a higher number over a lower number, such as 110/72. The higher number (systolic) is the pressure inside your arteries when your heart pumps. The lower number (diastolic) is the pressure inside your arteries when your heart relaxes. Ideally you want your blood pressure below 120/80. Hypertension forces your heart to work harder to pump blood. Your arteries may become narrow or stiff. Having untreated or uncontrolled hypertension can cause heart attack, stroke, kidney disease, and other problems. RISK FACTORS Some risk factors for high blood pressure are controllable. Others are not.  Risk factors you cannot control include:   Race. You may be at higher risk if you are African American.  Age. Risk increases with age.  Gender. Men are at higher risk than women before age 63 years. After age 54, women are at higher risk than men. Risk factors you can control include:  Not getting enough exercise or physical activity.  Being overweight.  Getting too much fat, sugar, calories, or salt in your diet.  Drinking too much alcohol. SIGNS AND SYMPTOMS Hypertension does not usually cause signs or symptoms. Extremely high blood  pressure (hypertensive crisis) may cause headache, anxiety, shortness of breath, and nosebleed. DIAGNOSIS To check if you have hypertension, your health care provider will measure your blood pressure while you are seated, with your arm held at the level of your heart. It should be measured at least twice using the same arm. Certain conditions can cause a difference in blood pressure between your right and left arms. A  blood pressure reading that is higher than normal on one occasion does not mean that you need treatment. If it is not clear whether you have high blood pressure, you may be asked to return on a different day to have your blood pressure checked again. Or, you may be asked to monitor your blood pressure at home for 1 or more weeks. TREATMENT Treating high blood pressure includes making lifestyle changes and possibly taking medicine. Living a healthy lifestyle can help lower high blood pressure. You may need to change some of your habits. Lifestyle changes may include:  Following the DASH diet. This diet is high in fruits, vegetables, and whole grains. It is low in salt, red meat, and added sugars.  Keep your sodium intake below 2,300 mg per day.  Getting at least 30-45 minutes of aerobic exercise at least 4 times per week.  Losing weight if necessary.  Not smoking.  Limiting alcoholic beverages.  Learning ways to reduce stress. Your health care provider may prescribe medicine if lifestyle changes are not enough to get your blood pressure under control, and if one of the following is true:  You are 7918-259 years of age and your systolic blood pressure is above 140.  You are 59 years of age or older, and your systolic blood pressure is above 150.  Your diastolic blood pressure is above 90.  You have diabetes, and your systolic blood pressure is over 140 or your diastolic blood pressure is over 90.  You have kidney disease and your blood pressure is above 140/90.  You have heart disease and your blood pressure is above 140/90. Your personal target blood pressure may vary depending on your medical conditions, your age, and other factors. HOME CARE INSTRUCTIONS  Have your blood pressure rechecked as directed by your health care provider.   Take medicines only as directed by your health care provider. Follow the directions carefully. Blood pressure medicines must be taken as prescribed.  The medicine does not work as well when you skip doses. Skipping doses also puts you at risk for problems.  Do not smoke.   Monitor your blood pressure at home as directed by your health care provider. SEEK MEDICAL CARE IF:   You think you are having a reaction to medicines taken.  You have recurrent headaches or feel dizzy.  You have swelling in your ankles.  You have trouble with your vision. SEEK IMMEDIATE MEDICAL CARE IF:  You develop a severe headache or confusion.  You have unusual weakness, numbness, or feel faint.  You have severe chest or abdominal pain.  You vomit repeatedly.  You have trouble breathing. MAKE SURE YOU:   Understand these instructions.  Will watch your condition.  Will get help right away if you are not doing well or get worse.   This information is not intended to replace advice given to you by your health care provider. Make sure you discuss any questions you have with your health care provider.   Document Released: 04/25/2005 Document Revised: 09/09/2014 Document Reviewed: 02/15/2013 Elsevier Interactive Patient Education [employer]2016 Elsevier Inc.

## 2015-09-30 ENCOUNTER — Telehealth: Payer: Self-pay | Admitting: *Deleted

## 2015-09-30 NOTE — Telephone Encounter (Signed)
Patient complaining of SOB onset 3 weeks ago. Has appointment for 10/06/2015, but would like to be seen sooner. No worsening since onset. Denies chest pain or fever. Had chest x-ray in ED 09/28/15 that showed mild right middle lobe atelectasis. Offered appointment for today, but patient declined. Advised patient to go to ED for any worsening symptoms. Patient agreed. Added to your schedule 10/02/2015 at 2:45. Thanks  [employer]

## 2015-10-02 ENCOUNTER — Ambulatory Visit: Payer: Self-pay | Admitting: Internal Medicine

## 2015-10-06 ENCOUNTER — Encounter: Payer: Self-pay | Admitting: Internal Medicine

## 2015-10-14 DIAGNOSIS — Z86010 Personal history of colonic polyps: Secondary | ICD-10-CM | POA: Diagnosis not present

## 2015-10-21 ENCOUNTER — Encounter: Payer: Self-pay | Admitting: Internal Medicine

## 2015-10-29 ENCOUNTER — Emergency Department (HOSPITAL_COMMUNITY)
Admission: EM | Admit: 2015-10-29 | Discharge: 2015-10-29 | Disposition: A | Discharge status: Home / Self Care | Payer: Medicare Other | Attending: Emergency Medicine | Admitting: Emergency Medicine

## 2015-10-29 ENCOUNTER — Encounter (HOSPITAL_COMMUNITY): Payer: Self-pay | Admitting: Emergency Medicine

## 2015-10-29 DIAGNOSIS — M25532 Pain in left wrist: Secondary | ICD-10-CM

## 2015-10-29 DIAGNOSIS — E785 Hyperlipidemia, unspecified: Secondary | ICD-10-CM | POA: Diagnosis not present

## 2015-10-29 DIAGNOSIS — M10032 Idiopathic gout, left wrist: Secondary | ICD-10-CM | POA: Insufficient documentation

## 2015-10-29 DIAGNOSIS — M25522 Pain in left elbow: Secondary | ICD-10-CM

## 2015-10-29 DIAGNOSIS — Z7982 Long term (current) use of aspirin: Secondary | ICD-10-CM | POA: Diagnosis not present

## 2015-10-29 DIAGNOSIS — I10 Essential (primary) hypertension: Secondary | ICD-10-CM | POA: Diagnosis not present

## 2015-10-29 DIAGNOSIS — Z79899 Other long term (current) drug therapy: Secondary | ICD-10-CM | POA: Insufficient documentation

## 2015-10-29 DIAGNOSIS — M109 Gout, unspecified: Secondary | ICD-10-CM

## 2015-10-29 DIAGNOSIS — I2510 Atherosclerotic heart disease of native coronary artery without angina pectoris: Secondary | ICD-10-CM | POA: Diagnosis not present

## 2015-10-29 DIAGNOSIS — E119 Type 2 diabetes mellitus without complications: Secondary | ICD-10-CM | POA: Insufficient documentation

## 2015-10-29 DIAGNOSIS — M10022 Idiopathic gout, left elbow: Secondary | ICD-10-CM | POA: Insufficient documentation

## 2015-10-29 DIAGNOSIS — Z7722 Contact with and (suspected) exposure to environmental tobacco smoke (acute) (chronic): Secondary | ICD-10-CM | POA: Insufficient documentation

## 2015-10-29 DIAGNOSIS — I252 Old myocardial infarction: Secondary | ICD-10-CM | POA: Diagnosis not present

## 2015-10-29 MED ORDER — KETOROLAC TROMETHAMINE 60 MG/2ML IM SOLN
60.0000 mg | Freq: Once | INTRAMUSCULAR | Status: AC
Start: 2015-10-29 — End: 2015-10-29
  Administered 2015-10-29: 60 mg via INTRAMUSCULAR
  Filled 2015-10-29: qty 2

## 2015-10-29 MED ORDER — HYDROMORPHONE HCL 1 MG/ML IJ SOLN
1.0000 mg | Freq: Once | INTRAMUSCULAR | Status: AC
  Administered 2015-10-29: 1 mg via INTRAMUSCULAR
  Filled 2015-10-29: qty 1

## 2015-10-29 MED ORDER — HYDROCODONE-ACETAMINOPHEN 5-325 MG PO TABS: 1.0000 | ORAL_TABLET | ORAL | Status: DC | PRN

## 2015-10-29 MED ORDER — PREDNISONE 10 MG PO TABS
20.0000 mg | ORAL_TABLET | Freq: Every day | ORAL | Status: AC
Start: 2015-10-29 — End: 2015-11-05

## 2015-10-29 MED ORDER — PREDNISONE 20 MG PO TABS
60.0000 mg | ORAL_TABLET | Freq: Once | ORAL | Status: AC
  Administered 2015-10-29: 60 mg via ORAL
  Filled 2015-10-29: qty 3

## 2015-10-29 NOTE — ED Provider Notes (Signed)
CSN: [id number]     Arrival date & time 10/29/15  0202 History   First MD Initiated Contact with Patient 10/29/15 0255     Chief Complaint  Patient presents with  . Gout     (Consider location/radiation/quality/duration/timing/severity/associated sxs/prior Treatment) HPI   Left elbow and wrist pain for 1 month, comes and goes, swelling to the back of the elbow and then swelling to the wrist and hand with severe pain in the wrist and elbow. Hurts all day and all night. Heating pad and ice don't help. Taking colchicine which does not help.  No fever.   Feels like gout but taking medicine every day.   Past Medical History  Diagnosis Date  . Coronary artery disease     Holter monitor 02/2012 - sinus with rare PVC  . Gout   . Hyperlipidemia   . Hypertension   . Osteomyelitis ([place])   . CTEV (congenital talipes equinovarus)   . Myocardial infarction [facility]) ~ 2007    "mild"  . Type II diabetes mellitus ([place])   . Club foot of both lower extremities 1958  . Pneumonia 2000's    "couple times"   Past Surgical History  Procedure Laterality Date  . Anterior cervical decomp/discectomy fusion  X 2  . Foot surgery Left x16    "joints collapsed; keep getting infected"  . Posterior cervical fusion/foraminotomy  X 2  . Foot fracture surgery Right 1990's    "pt a steel plate in"  . Cardiac catheterization     Family History  Problem Relation Age of Onset  . Diabetes Mother   . Coronary artery disease Mother     HAS PACEMAKER  . Heart disease Mother   . Heart attack Brother    Social History  Substance Use Topics  . Smoking status: Former Smoker -- 1.00 packs/day for 32 years    Types: Cigarettes    Quit date: 07/06/2007  . Smokeless tobacco: Never Used  . Alcohol Use: 0.0 oz/week    0 Standard drinks or equivalent per week     Comment: Sometimes.    Review of Systems  Constitutional: Negative for fever.  HENT: Negative for sore throat.   Eyes: Negative for visual disturbance.   Respiratory: Negative for shortness of breath.   Cardiovascular: Negative for chest pain.  Gastrointestinal: Negative for nausea, vomiting and abdominal pain.  Genitourinary: Negative for difficulty urinating.  Musculoskeletal: Positive for arthralgias. Negative for back pain and neck stiffness.  Skin: Negative for rash.  Neurological: Negative for syncope and headaches.      Allergies  Morphine and Pravastatin sodium  Home Medications   Prior to Admission medications   Medication Sig Start Date End Date Taking? Authorizing Provider  acetaminophen (TYLENOL) 500 MG tablet Take 1,000 mg by mouth every 6 (six) hours as needed for moderate pain (gout).   Yes Historical Provider, MD  allopurinol (ZYLOPRIM) 100 MG tablet Take 100 mg by mouth daily.   Yes Historical Provider, MD  aspirin EC 81 MG tablet Take 1 tablet (81 mg total) by mouth daily. 03/19/15  Yes Burgess Estelle, MD  Blood Glucose Monitoring Suppl (ONE TOUCH ULTRA 2) W/DEVICE KIT Check blood sugar twice daily as instructed dx code 250.00 07/10/13  Yes Jessee Avers, MD  colchicine 0.6 MG tablet Take 1 tablet (0.6 mg total) by mouth daily as needed (gout). For gout flare 09/28/15 09/27/16 Yes Dorie Rank, MD  diphenhydramine-acetaminophen (TYLENOL PM) 25-500 MG TABS tablet Take 1-2 tablets by mouth 2 (  two) times daily as needed (pain).   Yes Historical Provider, MD  glipiZIDE (GLUCOTROL XL) 5 MG 24 hr tablet Take 1 tablet (5 mg total) by mouth daily with breakfast. 07/24/15  Yes Tasrif Ahmed, MD  glucose blood (ONE TOUCH ULTRA TEST) test strip Check blood sugar twice daily as instructed dx code 250.00 03/19/15  Yes Burgess Estelle, MD  lisinopril (PRINIVIL,ZESTRIL) 20 MG tablet Take 1 tablet (20 mg total) by mouth daily. 07/24/15 07/23/16 Yes Tasrif Ahmed, MD  naproxen (NAPROSYN) 375 MG tablet Take 1 tablet (375 mg total) by mouth 2 (two) times daily. Patient taking differently: Take 375 mg by mouth daily as needed for mild pain.  06/14/15   Yes Margarita Mail, PA-C  ONETOUCH DELICA LANCETS FINE MISC Check blood sugar twice daily as instructed dx code 250.00 07/10/13  Yes Jessee Avers, MD  HYDROcodone-acetaminophen (NORCO/VICODIN) 5-325 MG tablet Take 1-2 tablets by mouth every 4 (four) hours as needed. 10/29/15   Gareth Morgan, MD  predniSONE (DELTASONE) 10 MG tablet Take 2 tablets (20 mg total) by mouth daily. 10/29/15 11/05/15  Gareth Morgan, MD   BP 189/117 mmHg  Pulse 98  Temp(Src) 97.9 F (36.6 C) (Oral)  Resp 20  Ht _0  (1.727 m)  Wt 243 lb (110.224 kg)  BMI 36.96 kg/m2  SpO2 98% Physical Exam  Constitutional: He is oriented to person, place, and time. He appears well-developed and well-nourished. No distress.  HENT:  Head: Normocephalic and atraumatic.  Eyes: Conjunctivae and EOM are normal.  Neck: Normal range of motion.  Cardiovascular: Normal rate, regular rhythm, normal heart sounds and intact distal pulses.  Exam reveals no gallop and no friction rub.   No murmur heard. Pulmonary/Chest: Effort normal and breath sounds normal. No respiratory distress. He has no wheezes. He has no rales.  Abdominal: Soft. He exhibits no distension. There is no tenderness. There is no guarding.  Musculoskeletal: He exhibits no edema.       Left elbow: He exhibits decreased range of motion and swelling. He exhibits no deformity and no laceration.       Left wrist: He exhibits decreased range of motion, tenderness and swelling. He exhibits no deformity and no laceration.  Neurological: He is alert and oriented to person, place, and time.  Skin: Skin is warm and dry. He is not diaphoretic.  Nursing note and vitals reviewed.   ED Course  Procedures (including critical care time) Labs Review Labs Reviewed - No data to display  Imaging Review No results found. I have personally reviewed and evaluated these images and lab results as part of my medical decision-making.   EKG Interpretation None      MDM   Final  diagnoses:  Acute gout of left wrist, unspecified cause  Left elbow pain  Left wrist pain   60yo male with history of CAD, htn, hlpd, DM, Gout, multiple left foot surgeries, cervical spine surgeries, presents with concern for pain of the left elbow and left wrist.  Patient has had gout in these joints before, and reports the pain is similar however has been taking medication without relief.  Joints with decreased ROM which he reports is consistent with prior gout flares, however no sign of erythema, no fevers. Given consistency with prior evaluations without systemic signs of infection or erythema over joint, do not feel arthrocentesis or further septic joint evaluation is indicated at this time and feel acute osteomyelitis is unlikely. Will add prednisone onto his current colchicine/NSAID regimen. Discussed need  for close monitoring of glucose while on prednisone.  Discussed that if continuing pain without fevers after this would consider further imaging or specialty consultation, and if he develops worsening pain, fevers, redness, he should return to the ED.  Patient neurovascularly intact, strong pulses, no swelling and doubt arterial or venous occlusion. Given norco rx for pain control.  Pt with htn on arrival to ED, no symptoms to suggest hypertensive emergency, recommend continued PCP follow up.  Patient discharged in stable condition with understanding of reasons to return.    Gareth Morgan, MD 10/29/15 [phone]

## 2015-10-29 NOTE — ED Notes (Addendum)
Pt. reports worsening gout at left wrist and left elbow joint with pain and swelling for several weeks , denies injury or fall . No fever or chills. Hypertensive at triage .

## 2015-10-29 NOTE — Discharge Instructions (Signed)

## 2015-10-29 NOTE — ED Notes (Signed)
Provider at bedside

## 2015-12-08 ENCOUNTER — Encounter: Payer: Self-pay | Admitting: Internal Medicine

## 2015-12-09 ENCOUNTER — Other Ambulatory Visit: Payer: Self-pay | Admitting: Internal Medicine

## 2015-12-09 DIAGNOSIS — M109 Gout, unspecified: Secondary | ICD-10-CM

## 2015-12-28 ENCOUNTER — Emergency Department (HOSPITAL_COMMUNITY)
Admission: EM | Admit: 2015-12-28 | Discharge: 2015-12-28 | Disposition: A | Discharge status: Home / Self Care | Payer: Medicare Other | Attending: Dermatology | Admitting: Dermatology

## 2015-12-28 ENCOUNTER — Encounter (HOSPITAL_COMMUNITY): Payer: Self-pay | Admitting: Emergency Medicine

## 2015-12-28 ENCOUNTER — Other Ambulatory Visit: Payer: Self-pay | Admitting: Internal Medicine

## 2015-12-28 DIAGNOSIS — I2510 Atherosclerotic heart disease of native coronary artery without angina pectoris: Secondary | ICD-10-CM | POA: Diagnosis not present

## 2015-12-28 DIAGNOSIS — Z5321 Procedure and treatment not carried out due to patient leaving prior to being seen by health care provider: Secondary | ICD-10-CM | POA: Diagnosis not present

## 2015-12-28 DIAGNOSIS — I252 Old myocardial infarction: Secondary | ICD-10-CM | POA: Diagnosis not present

## 2015-12-28 DIAGNOSIS — Z87891 Personal history of nicotine dependence: Secondary | ICD-10-CM | POA: Insufficient documentation

## 2015-12-28 DIAGNOSIS — E119 Type 2 diabetes mellitus without complications: Secondary | ICD-10-CM | POA: Insufficient documentation

## 2015-12-28 DIAGNOSIS — Z7984 Long term (current) use of oral hypoglycemic drugs: Secondary | ICD-10-CM | POA: Insufficient documentation

## 2015-12-28 DIAGNOSIS — I10 Essential (primary) hypertension: Secondary | ICD-10-CM | POA: Insufficient documentation

## 2015-12-28 DIAGNOSIS — M109 Gout, unspecified: Secondary | ICD-10-CM

## 2015-12-28 DIAGNOSIS — Z7982 Long term (current) use of aspirin: Secondary | ICD-10-CM | POA: Insufficient documentation

## 2015-12-28 NOTE — ED Triage Notes (Signed)
Pt st's he ran out of his HTN meds on Fri.  St's he called his MD and they were suppose to call it in but had not as of tonight.  Pt st's he thought his b/p was up because he felt lightheaded.

## 2015-12-28 NOTE — ED Notes (Signed)
States that his blood pressure was, and her was leaving. Informed patient that if he needed to come back that we were happy to see him.

## 2015-12-29 ENCOUNTER — Other Ambulatory Visit: Payer: Self-pay | Admitting: Internal Medicine

## 2015-12-29 DIAGNOSIS — I10 Essential (primary) hypertension: Secondary | ICD-10-CM

## 2016-01-12 ENCOUNTER — Ambulatory Visit (HOSPITAL_COMMUNITY)
Admission: RE | Admit: 2016-01-12 | Discharge: 2016-01-12 | Disposition: A | Discharge status: Home / Self Care | Payer: Medicare Other | Source: Ambulatory Visit | Attending: Family Medicine | Admitting: Family Medicine

## 2016-01-12 ENCOUNTER — Ambulatory Visit (INDEPENDENT_AMBULATORY_CARE_PROVIDER_SITE_OTHER): Payer: Medicare Other | Admitting: Internal Medicine

## 2016-01-12 ENCOUNTER — Encounter: Payer: Self-pay | Admitting: Internal Medicine

## 2016-01-12 VITALS — BP 126/88 | HR 97 | Temp 98.3°F | Ht 67.0 in | Wt 226.9 lb

## 2016-01-12 DIAGNOSIS — Z7984 Long term (current) use of oral hypoglycemic drugs: Secondary | ICD-10-CM

## 2016-01-12 DIAGNOSIS — E1165 Type 2 diabetes mellitus with hyperglycemia: Secondary | ICD-10-CM

## 2016-01-12 DIAGNOSIS — D509 Iron deficiency anemia, unspecified: Secondary | ICD-10-CM

## 2016-01-12 DIAGNOSIS — Z0000 Encounter for general adult medical examination without abnormal findings: Secondary | ICD-10-CM

## 2016-01-12 DIAGNOSIS — I10 Essential (primary) hypertension: Secondary | ICD-10-CM | POA: Diagnosis not present

## 2016-01-12 DIAGNOSIS — E119 Type 2 diabetes mellitus without complications: Secondary | ICD-10-CM | POA: Diagnosis not present

## 2016-01-12 DIAGNOSIS — I25119 Atherosclerotic heart disease of native coronary artery with unspecified angina pectoris: Secondary | ICD-10-CM | POA: Insufficient documentation

## 2016-01-12 DIAGNOSIS — Z87891 Personal history of nicotine dependence: Secondary | ICD-10-CM

## 2016-01-12 LAB — GLUCOSE, CAPILLARY
GLUCOSE-CAPILLARY: 431 mg/dL — AB (ref 65–99)
Glucose-Capillary: 570 mg/dL (ref 65–99)

## 2016-01-12 LAB — BASIC METABOLIC PANEL
Anion gap: 9 (ref 5–15)
BUN: 17 mg/dL (ref 6–20)
CALCIUM: 9.7 mg/dL (ref 8.9–10.3)
CO2: 24 mmol/L (ref 22–32)
CREATININE: 1.24 mg/dL (ref 0.61–1.24)
Chloride: 100 mmol/L — ABNORMAL LOW (ref 101–111)
GFR calc non Af Amer: >60 mL/min (ref >60)
Glucose, Bld: 470 mg/dL — ABNORMAL HIGH (ref 65–99)
Potassium: 4.1 mmol/L (ref 3.5–5.1)
SODIUM: 133 mmol/L — AB (ref 135–145)

## 2016-01-12 LAB — POCT GLYCOSYLATED HEMOGLOBIN (HGB A1C): Hemoglobin A1C: 11.9

## 2016-01-12 MED ORDER — INSULIN PEN NEEDLE 32G X 6 MM MISC: 3 refills | Status: DC

## 2016-01-12 MED ORDER — GLUCOSE BLOOD VI STRP: ORAL_STRIP | 5 refills | Status: DC

## 2016-01-12 MED ORDER — INSULIN ASPART 100 UNIT/ML ~~LOC~~ SOLN
10.0000 [IU] | Freq: Once | SUBCUTANEOUS | Status: AC
Start: 2016-01-12 — End: 2016-01-12
  Administered 2016-01-12: 10 [IU] via SUBCUTANEOUS

## 2016-01-12 MED ORDER — NITROGLYCERIN 0.4 MG SL SUBL: 0.4000 mg | SUBLINGUAL_TABLET | SUBLINGUAL | Status: DC | PRN

## 2016-01-12 MED ORDER — GLIPIZIDE 5 MG PO TABS: 5.0000 mg | ORAL_TABLET | Freq: Two times a day (BID) | ORAL | 3 refills | Status: DC

## 2016-01-12 MED ORDER — LANCETS ULTRA THIN 30G MISC: 5 refills | Status: DC

## 2016-01-12 MED ORDER — LIRAGLUTIDE 18 MG/3ML ~~LOC~~ SOPN
0.6000 mg | PEN_INJECTOR | Freq: Every day | SUBCUTANEOUS | 0 refills | Status: DC
Start: 2016-01-12 — End: 2016-03-22

## 2016-01-12 MED ORDER — LIRAGLUTIDE 18 MG/3ML ~~LOC~~ SOPN
1.2000 mg | PEN_INJECTOR | Freq: Every day | SUBCUTANEOUS | 2 refills | Status: DC
Start: 2016-01-12 — End: 2016-02-17

## 2016-01-12 NOTE — Progress Notes (Signed)
Patient was provided with a sample One touch ultramini meter at his office visit today. He demonstrated ability to use meter by checking his blood sugar with result of 533 mg/dl. Please order strips and lancets as his current prescriptions have expired.   He agrees to appointment to see certified diabetes educator at doctor's request in 2 weeks.

## 2016-01-13 ENCOUNTER — Other Ambulatory Visit: Payer: Self-pay | Admitting: *Deleted

## 2016-01-13 LAB — URINALYSIS, ROUTINE W REFLEX MICROSCOPIC
BILIRUBIN UA: NEGATIVE
KETONES UA: NEGATIVE
LEUKOCYTES UA: NEGATIVE
Nitrite, UA: NEGATIVE
PROTEIN UA: NEGATIVE
RBC UA: NEGATIVE
UUROB: 1 mg/dL (ref 0.2–1.0)
pH, UA: 5.5 (ref 5.0–7.5)

## 2016-01-13 LAB — ANEMIA PROFILE B
BASOS ABS: 0 10*3/uL (ref 0.0–0.2)
Basos: 0 %
EOS (ABSOLUTE): 0.2 10*3/uL (ref 0.0–0.4)
Eos: 3 %
FOLATE: 4.6 ng/mL (ref >3.0)
Ferritin: 660 ng/mL — ABNORMAL HIGH (ref 30–400)
Hematocrit: 41.4 % (ref 37.5–51.0)
Hemoglobin: 12.9 g/dL (ref 12.6–17.7)
IMMATURE GRANULOCYTES: 0 %
IRON: 89 ug/dL (ref 38–169)
Immature Grans (Abs): 0 10*3/uL (ref 0.0–0.1)
Iron Saturation: 26 % (ref 15–55)
LYMPHS ABS: 2.9 10*3/uL (ref 0.7–3.1)
Lymphs: 37 %
MCH: 22.2 pg — ABNORMAL LOW (ref 26.6–33.0)
MCHC: 31.2 g/dL — ABNORMAL LOW (ref 31.5–35.7)
MCV: 71 fL — AB (ref 79–97)
MONOCYTES: 10 %
Monocytes Absolute: 0.8 10*3/uL (ref 0.1–0.9)
NEUTROS ABS: 4 10*3/uL (ref 1.4–7.0)
NEUTROS PCT: 50 %
PLATELETS: 256 10*3/uL (ref 150–379)
RBC: 5.81 x10E6/uL — AB (ref 4.14–5.80)
RDW: 15.1 % (ref 12.3–15.4)
RETIC CT PCT: 1 % (ref 0.6–2.6)
TIBC: 346 ug/dL (ref 250–450)
UIBC: 257 ug/dL (ref 111–343)
VITAMIN B 12: 571 pg/mL (ref 211–946)
WBC: 7.9 10*3/uL (ref 3.4–10.8)

## 2016-01-13 NOTE — Assessment & Plan Note (Addendum)
HPI  Pt reports having substernal, "pressure-like," CP for the past few weeks. States the pain occurs when he is at work, about 4x/ wk, and each episode lasts several hours. Reports having associated SOB. Reports having constant diaphoresis from working outdoor in the heat and is not sure if it is associated with the CP. Denies having any associated nausea or vomiting. Denies having any GERD symptoms. Denies any trauma to the chest area.   A Atypical CP. Cardiac cath done 07/2009 showing nonobstructive CAD with 20-30% obtruse marginal. Stress test done 11/2014 was a low risk study. Pt has not followed-up with cardiology in over a year and stopped taking Aspirin. STAT EKG similar to prior tracings, no acute ST/ T wave changes.   P -Sublingual nitroglycerin for angina. Advised him to seek immediate medical attention if the CP does not get better with nitroglycerin.  -Advised him to start taking Aspirin 81 mg daily  -Advised him to follow-up with cardiology. Referral has been placed.

## 2016-01-13 NOTE — Assessment & Plan Note (Signed)
BP Readings from Last 3 Encounters:  01/12/16 126/88  12/28/15 121/81  10/29/15 (!) 189/117    Lab Results  Component Value Date   NA 133 (L) 01/12/2016   K 4.1 01/12/2016   CREATININE 1.24 01/12/2016    Assessment: Blood pressure control:  below goal (<140/90) Progress toward BP goal:   improved  Comments: Medication list showing coreg 3.125 mg bid and lisinopril 10 mg daily. Pt states he is taking lisinopril only and never took coreg.   Plan: Medications: Continue lisinopril as above.  Educational resources provided:   Educated patient about healthy eating and exercise. Emphasized the importance of weight loss.

## 2016-01-13 NOTE — Progress Notes (Signed)
   CC: Patient is here to discuss his chronic medical conditions including HTN, DM2, CAD, and anemia.   HPI:  [name] is a 60 y.o. M with a PMHx of conditions listed below presenting to the clinic to discuss his chronic medical conditions including HTN, DM2, CAD, and anemia. Please seen problem based charting for the status of the patient's chronic medical conditions.    Past Medical History:  Diagnosis Date  . Club foot of both lower extremities 1958  . Coronary artery disease    Holter monitor 02/2012 - sinus with rare PVC  . CTEV (congenital talipes equinovarus)   . Gout   . Hyperlipidemia   . Hypertension   . Myocardial infarction [facility] ~ 2007   "mild"  . Osteomyelitis ([facility])   . Pneumonia 2000's   "couple times"  . Type II diabetes mellitus ([facility]) 2011    Review of Systems:  Pertinent positives mentioned in HPI. Remainder of all ROS negative.   Physical Exam:  Vitals:   01/12/16 1511  BP: 126/88  Pulse: 97  Temp: 98.3 F (36.8 C)  TempSrc: Oral  SpO2: 100%  Weight: 226 lb 14.4 oz (102.9 kg)  Height: 5\' 7"  (1.702 m)   Physical Exam  Constitutional: He is oriented to person, place, and time. He appears well-developed and well-nourished. No distress.  HENT:  Head: Normocephalic and atraumatic.  Mouth/Throat: Oropharynx is clear and moist.  Eyes: EOM are normal. Pupils are equal, round, and reactive to light.  Neck: Neck supple. No tracheal deviation present.  Cardiovascular: Normal rate, regular rhythm and intact distal pulses.   Pulmonary/Chest: Effort normal and breath sounds normal. No respiratory distress. He has no wheezes. He has no rales.  Abdominal: Soft. Bowel sounds are normal. He exhibits no distension. There is no tenderness. There is no guarding.  Musculoskeletal: He exhibits no edema or deformity.  Neurological: He is alert and oriented to person, place, and time. Coordination normal.  Skin: Skin is warm and dry.  Psychiatric: He has a  normal mood and affect. His behavior is normal.    Assessment & Plan:   See Encounters Tab for problem based charting.  Patient discussed with Dr. Criselda PeachesMullen

## 2016-01-13 NOTE — Assessment & Plan Note (Signed)
A Per chart documentation, colonoscopy referrals have been placed several times in the past. Pt states he has an appointment with GI on 01/25/16.  P -Encouraged him to go to his appt

## 2016-01-13 NOTE — Assessment & Plan Note (Signed)
A Pt denies having any melena, hematochezia, or hematemesis. Medication list showing iron supplementation. Pt states he never took the supplement. Anemia panel at this visit showing Hgb 12.7 and normal iron saturation (26%). Review of chart shows his MCV has been low since 2008 and peripheral blood smear done in 2012 was showing target cells. 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    P -CTM

## 2016-01-13 NOTE — Assessment & Plan Note (Addendum)
Lab Results  Component Value Date   HGBA1C 11.9 01/12/2016   HGBA1C 8.3 06/19/2015   HGBA1C 9.2 03/19/2015     Assessment: Diabetes control:  poorly controlled  Progress toward A1C goal:   deteriorated  Comments: Pt reports eating "everything" and drinking lots of sodas and sweet green tea. Medication list showing metformin xr 500 mg daily and glipizide 5 mg daily. Patient states he stopped taking metformin several months ago because it was causing him to have diarrhea. At present, only taking glipizide. States he has not checked his blood glucose for several months because he lost his meter. Reports feeling well at present but does report having intermittent blurry vision, intermittent lightheadenesss, and polydipsia for the past 2 weeks. CBG at this visit 570. Pt was given 10u Novolog and CBG down to 431. STAT BMP were ordered due to concern for DKA but patient was in a rush to leave becuae he had to go pick up a friend. Told me he does not have a cell phone, as such, he gave me verbal permission to call his father and leave a message explaining the lab reslts and my recommendations/ medication changes.  Labs returned showing normal AG (9) and normal bicarb (24). No emergent need for hospitalization. No ketones on UA.  Plan: Medications: Increase Glipizide to 5 mg BID. Start Victoza 0.6 mg once daily x 1 week, then continue Victoza 1.2 mg once daily.  Home glucose monitoring: Frequency:  3x per day Timing:  before meals and if he experiences hypoglycemic symptoms  Instruction/counseling given: Educated patient about healthy eating and exercise. Emphasized the importance of weight loss.  Other plans:  -I spoke to the patient's father Mr. Starleen ArmsHoward Maurin over the phone and discussed the lab results and new medication regimen with him. Explained to him in detail the symptoms of DKA/HHS and advised that pt/ him seek immediate medical attention if patient expriences any of those symptoms.  -New  meter given -Testing supplies refilled -Referral to diabetes educator/ dietician  -RTC in 2 weeks

## 2016-01-14 MED ORDER — INSULIN PEN NEEDLE 31G X 5 MM MISC: 5 refills | Status: DC

## 2016-01-14 NOTE — Progress Notes (Signed)
Internal Medicine Clinic Attending  Case discussed with Dr. Rathoreat the time of the visit. We reviewed the resident's history and exam and pertinent patient test results. I agree with the assessment, diagnosis, and plan of care documented in the resident's note.  

## 2016-01-15 NOTE — Telephone Encounter (Signed)
ried calling patient to be sure he got the victoza,  pen needles and strips  and understood how to take it. He was not in.

## 2016-01-19 ENCOUNTER — Telehealth: Payer: Self-pay | Admitting: Dietician

## 2016-01-19 NOTE — Telephone Encounter (Signed)
Thanks Lupita LeashDonna. Patient has been scheduled for a follow-up on 01/27/16.

## 2016-01-19 NOTE — Telephone Encounter (Signed)
Patient presented to office for help with setting the time on his blood glucose meter. This was done along with additional teaching on how to use his lancing device.   He voiced concern about his blood sugars going up and down and  he has cut back on regular soda to about 1/2 cup per day. He is checking his blood sugar 2-3 times a day- review of values shows decreasing values since his last visit. He reports having started the Victoza without problems and plans to increase his dose today as instructed.  Support and encouragement provided.

## 2016-01-25 ENCOUNTER — Telehealth: Payer: Self-pay | Admitting: Internal Medicine

## 2016-01-25 DIAGNOSIS — Z86010 Personal history of colonic polyps: Secondary | ICD-10-CM | POA: Diagnosis not present

## 2016-01-25 DIAGNOSIS — Z8371 Family history of colonic polyps: Secondary | ICD-10-CM | POA: Diagnosis not present

## 2016-01-25 NOTE — Telephone Encounter (Signed)
Follow Up:     Returning call,says he does not know the name of who called.

## 2016-01-27 ENCOUNTER — Ambulatory Visit (INDEPENDENT_AMBULATORY_CARE_PROVIDER_SITE_OTHER): Payer: Medicare Other | Admitting: Internal Medicine

## 2016-01-27 VITALS — BP 149/89 | HR 96 | Temp 98.0°F | Ht 68.0 in | Wt 231.9 lb

## 2016-01-27 DIAGNOSIS — Z87891 Personal history of nicotine dependence: Secondary | ICD-10-CM

## 2016-01-27 DIAGNOSIS — E119 Type 2 diabetes mellitus without complications: Secondary | ICD-10-CM | POA: Diagnosis not present

## 2016-01-27 DIAGNOSIS — Z7984 Long term (current) use of oral hypoglycemic drugs: Secondary | ICD-10-CM | POA: Diagnosis not present

## 2016-01-27 DIAGNOSIS — I10 Essential (primary) hypertension: Secondary | ICD-10-CM

## 2016-01-27 DIAGNOSIS — Z79899 Other long term (current) drug therapy: Secondary | ICD-10-CM

## 2016-01-27 LAB — GLUCOSE, CAPILLARY: Glucose-Capillary: 151 mg/dL — ABNORMAL HIGH (ref 65–99)

## 2016-01-27 MED ORDER — AMLODIPINE BESYLATE 5 MG PO TABS: 5.0000 mg | ORAL_TABLET | Freq: Every day | ORAL | 0 refills | Status: DC

## 2016-01-27 NOTE — Patient Instructions (Addendum)
We have started amlodipine for your blood pressure.  Please read the attached documents for information on this medication and also on low blood sugar symptoms.  We have also referred you to see an eye doctor.  Amlodipine tablets What is this medicine? AMLODIPINE (am LOE di peen) is a calcium-channel blocker. It affects the amount of calcium found in your heart and muscle cells. This relaxes your blood vessels, which can reduce the amount of work the heart has to do. This medicine is used to lower high blood pressure. It is also used to prevent chest pain. This medicine may be used for other purposes; ask your health care provider or pharmacist if you have questions. What should I tell my health care provider before I take this medicine? They need to know if you have any of these conditions: -heart problems like heart failure or aortic stenosis -liver disease -an unusual or allergic reaction to amlodipine, other medicines, foods, dyes, or preservatives -pregnant or trying to get pregnant -breast-feeding How should I use this medicine? Take this medicine by mouth with a glass of water. Follow the directions on the prescription label. Take your medicine at regular intervals. Do not take more medicine than directed. Talk to your pediatrician regarding the use of this medicine in children. Special care may be needed. This medicine has been used in children as young as 6. Persons over 59 years old may have a stronger reaction to this medicine and need smaller doses. Overdosage: If you think you have taken too much of this medicine contact a poison control center or emergency room at once. NOTE: This medicine is only for you. Do not share this medicine with others. What if I miss a dose? If you miss a dose, take it as soon as you can. If it is almost time for your next dose, take only that dose. Do not take double or extra doses. What may interact with this medicine? -herbal or dietary  supplements -local or general anesthetics -medicines for high blood pressure -medicines for prostate problems -rifampin This list may not describe all possible interactions. Give your health care provider a list of all the medicines, herbs, non-prescription drugs, or dietary supplements you use. Also tell them if you smoke, drink alcohol, or use illegal drugs. Some items may interact with your medicine. What should I watch for while using this medicine? Visit your doctor or health care professional for regular check ups. Check your blood pressure and pulse rate regularly. Ask your health care professional what your blood pressure and pulse rate should be, and when you should contact him or her. This medicine may make you feel confused, dizzy or lightheaded. Do not drive, use machinery, or do anything that needs mental alertness until you know how this medicine affects you. To reduce the risk of dizzy or fainting spells, do not sit or stand up quickly, especially if you are an older patient. Avoid alcoholic drinks; they can make you more dizzy. Do not suddenly stop taking amlodipine. Ask your doctor or health care professional how you can gradually reduce the dose. What side effects may I notice from receiving this medicine? Side effects that you should report to your doctor or health care professional as soon as possible: -allergic reactions like skin rash, itching or hives, swelling of the face, lips, or tongue -breathing problems -changes in vision or hearing -chest pain -fast, irregular heartbeat -swelling of legs or ankles Side effects that usually do not require medical attention (report  to your doctor or health care professional if they continue or are bothersome): -dry mouth -facial flushing -nausea, vomiting -stomach gas, pain -tired, weak -trouble sleeping This list may not describe all possible side effects. Call your doctor for medical advice about side effects. You may report  side effects to FDA at 1-800-FDA-1088. Where should I keep my medicine? Keep out of the reach of children. Store at room temperature between 59 and 86 degrees F (15 and 30 degrees C). Protect from light. Keep container tightly closed. Throw away any unused medicine after the expiration date. NOTE: This sheet is a summary. It may not cover all possible information. If you have questions about this medicine, talk to your doctor, pharmacist, or health care provider.    2016, Elsevier/Gold Standard. (2012-03-23 11:40:58)  Hypoglycemia Low blood sugar (hypoglycemia) means that the level of sugar in your blood is lower than it should be. Signs of low blood sugar include:  Getting sweaty.  Feeling hungry.  Feeling dizzy or weak.  Feeling sleepier than normal.  Feeling nervous.  Headaches.  Having a fast heartbeat. Low blood sugar can happen fast and can be an emergency. Your doctor can do tests to check your blood sugar level. You can have low blood sugar and not have diabetes. HOME CARE  Check your blood sugar as told by your doctor. If it is less than 70 mg/dl or as told by your doctor, take 1 of the following:  3 to 4 glucose tablets.   cup clear juice.   cup soda pop, not diet.  1 cup milk.  5 to 6 hard candies.  Recheck blood sugar after 15 minutes. Repeat until it is at the right level.  Eat a snack if it is more than 1 hour until the next meal.  Only take medicine as told by your doctor.  Do not skip meals. Eat on time.  Do not drink alcohol except with meals.  Check your blood glucose before driving.  Check your blood glucose before and after exercise.  Always carry treatment with you, such as glucose pills.  Always wear a medical alert bracelet if you have diabetes. GET HELP RIGHT AWAY IF:   Your blood glucose goes below 70 mg/dl or as told by your doctor, and you:  Are confused.  Are not able to swallow.  Pass out (faint).  You cannot treat  yourself. You may need someone to help you.  You have low blood sugar problems often.  You have problems from your medicines.  You are not feeling better after 3 to 4 days.  You have vision changes. MAKE SURE YOU:   Understand these instructions.  Will watch this condition.  Will get help right away if you are not doing well or get worse.   This information is not intended to replace advice given to you by your health care provider. Make sure you discuss any questions you have with your health care provider.   Document Released: 07/20/2009 Document Revised: 05/16/2014 Document Reviewed: 12/30/2014 Elsevier Interactive Patient Education [employer]

## 2016-01-27 NOTE — Assessment & Plan Note (Signed)
A: His CBG in clinic today is 151.  He has had no issues regarding taking his new medications and not experiencing symptoms of hypoglycemia.  He reports that his polyuria has improved dramatically.  He also reports that he is drinking only 1 soda every other day and cutting back on other non-diabetic friendly food choices.  Review of his glucometer shows markedly improved glycemic control.  P: - continue glipizide 5mg  BID and Victoza 1.2mg  daily - referral for eye exam - f/u with Lupita Leashonna Plyler next week - f/u with PCP in 2-4 weeks with BP recheck - provided information on symptoms of hypoglycemia and appropriate home treatments.

## 2016-01-27 NOTE — Progress Notes (Signed)
   CC: here for DM f/u  HPI:  Mr.Johnn Gale JourneyWestmoreland is a 60 y.o. man with a past medical history listed below here today for follow up of his DM and HTN.   For details of today's visit and the status of his chronic medical issues please refer to the assessment and plan.   Past Medical History:  Diagnosis Date  . Club foot of both lower extremities 1958  . Coronary artery disease    Holter monitor 02/2012 - sinus with rare PVC  . CTEV (congenital talipes equinovarus)   . Gout   . Hyperlipidemia   . Hypertension   . Myocardial infarction [facility](HCC) ~ 2007   "mild"  . Osteomyelitis (HCC)   . Pneumonia 2000's   "couple times"  . Type II diabetes mellitus (HCC) 2011    Review of Systems:   Review of Systems  Constitutional: Negative for chills and fever.  Eyes: Positive for blurred vision.  Genitourinary: Negative for frequency.  Neurological: Negative for headaches.  Endo/Heme/Allergies: Negative for polydipsia.    Physical Exam:  Vitals:   01/27/16 1341  BP: (!) 149/89  Pulse: 96  Temp: 98 F (36.7 C)  TempSrc: Oral  SpO2: 98%  Weight: 231 lb 14.4 oz (105.2 kg)  Height: 5\' 8"  (1.727 m)   Physical Exam  Constitutional: He is oriented to person, place, and time and well-developed, well-nourished, and in no distress.  HENT:  Head: Normocephalic and atraumatic.  Pulmonary/Chest: Effort normal.  Neurological: He is alert and oriented to person, place, and time.  Skin: Skin is warm and dry.  Psychiatric: Mood and affect normal.     Assessment & Plan:   See Encounters Tab for problem based charting.  Patient discussed with Dr. Rogelia BogaButcher.  HYPERTENSION BP Readings from Last 3 Encounters:  01/27/16 (!) 149/89  01/12/16 126/88  12/28/15 121/81    A:  Patient here today for DM f/u.  His BP today is 149/89 which is above his goal considering his diabetes.  Review of his previous blood pressures indicate that control has not been ideal.  He has previously been on beta  blockers, HCTZ, and amlodipine.   P:  - we will continue lisinopril 20mg  daily - we will add amlodipine 5mg  daily and have him follow up in 2-4 weeks for a BP recheck - we avoided HCTZ today due to his history of gout but this may be a reasonable medication in the future if his gout is infrequent and BP is not controlled on above medications.  He may also benefit from switching to losartan for uricosuric benefits.  Diabetes (HCC) A: His CBG in clinic today is 151.  He has had no issues regarding taking his new medications and not experiencing symptoms of hypoglycemia.  He reports that his polyuria has improved dramatically.  He also reports that he is drinking only 1 soda every other day and cutting back on other non-diabetic friendly food choices.  Review of his glucometer shows markedly improved glycemic control.  P: - continue glipizide 5mg  BID and Victoza 1.2mg  daily - referral for eye exam - f/u with Lupita Leashonna Plyler next week - f/u with PCP in 2-4 weeks with BP recheck - provided information on symptoms of hypoglycemia and appropriate home treatments.

## 2016-01-27 NOTE — Assessment & Plan Note (Addendum)
BP Readings from Last 3 Encounters:  01/27/16 (!) 149/89  01/12/16 126/88  12/28/15 121/81    A:  Patient here today for DM f/u.  His BP today is 149/89 which is above his goal considering his diabetes.  Review of his previous blood pressures indicate that control has not been ideal.  He has previously been on beta blockers, HCTZ, and amlodipine.   P:  - we will continue lisinopril 20mg  daily - we will add amlodipine 5mg  daily and have him follow up in 2-4 weeks for a BP recheck - we avoided HCTZ today due to his history of gout but this may be a reasonable medication in the future if his gout is infrequent and BP is not controlled on above medications.  He may also benefit from switching to losartan for uricosuric benefits.

## 2016-01-29 NOTE — Progress Notes (Signed)
Internal Medicine Clinic Attending  Case discussed with Dr. Wallace at the time of the visit.  We reviewed the resident's history and exam and pertinent patient test results.  I agree with the assessment, diagnosis, and plan of care documented in the resident's note.  

## 2016-02-01 ENCOUNTER — Other Ambulatory Visit: Payer: Self-pay | Admitting: Dietician

## 2016-02-01 ENCOUNTER — Ambulatory Visit: Payer: Medicare Other | Admitting: Dietician

## 2016-02-01 DIAGNOSIS — E119 Type 2 diabetes mellitus without complications: Secondary | ICD-10-CM

## 2016-02-01 LAB — HM DIABETES EYE EXAM

## 2016-02-01 NOTE — Progress Notes (Signed)
Patient was late for his appointment. Agreed to do retinal images per his request and asked him to schedule an appointment with me on another day for Medical Nutrition Therapy.

## 2016-02-02 ENCOUNTER — Encounter: Payer: Self-pay | Admitting: *Deleted

## 2016-02-08 ENCOUNTER — Encounter: Payer: Self-pay | Admitting: Dietician

## 2016-02-09 ENCOUNTER — Telehealth: Payer: Self-pay | Admitting: Internal Medicine

## 2016-02-09 NOTE — Telephone Encounter (Signed)
APT. REMINDER CALL, NO ANSWER, NO VOICEMAIL °

## 2016-02-10 ENCOUNTER — Ambulatory Visit (INDEPENDENT_AMBULATORY_CARE_PROVIDER_SITE_OTHER): Payer: Medicare Other | Admitting: Internal Medicine

## 2016-02-10 ENCOUNTER — Encounter: Payer: Self-pay | Admitting: Internal Medicine

## 2016-02-10 ENCOUNTER — Ambulatory Visit (INDEPENDENT_AMBULATORY_CARE_PROVIDER_SITE_OTHER): Payer: Medicare Other | Admitting: Dietician

## 2016-02-10 ENCOUNTER — Encounter: Payer: Self-pay | Admitting: Dietician

## 2016-02-10 DIAGNOSIS — Z79899 Other long term (current) drug therapy: Secondary | ICD-10-CM

## 2016-02-10 DIAGNOSIS — Z713 Dietary counseling and surveillance: Secondary | ICD-10-CM | POA: Diagnosis not present

## 2016-02-10 DIAGNOSIS — I10 Essential (primary) hypertension: Secondary | ICD-10-CM

## 2016-02-10 DIAGNOSIS — Z7984 Long term (current) use of oral hypoglycemic drugs: Secondary | ICD-10-CM | POA: Diagnosis not present

## 2016-02-10 DIAGNOSIS — E1165 Type 2 diabetes mellitus with hyperglycemia: Secondary | ICD-10-CM | POA: Diagnosis not present

## 2016-02-10 DIAGNOSIS — E119 Type 2 diabetes mellitus without complications: Secondary | ICD-10-CM

## 2016-02-10 DIAGNOSIS — Z87891 Personal history of nicotine dependence: Secondary | ICD-10-CM | POA: Diagnosis not present

## 2016-02-10 MED ORDER — AMLODIPINE BESYLATE 5 MG PO TABS: 5.0000 mg | ORAL_TABLET | Freq: Every day | ORAL | 1 refills | Status: DC

## 2016-02-10 NOTE — Progress Notes (Signed)
  Medical Nutrition Therapy:  Appt start time: 1400 end time:  1430. Visit # 1  Assessment:  Primary concerns today: blood sugar control  Daniel Perkins has been trying to decrease his portions, check his blood sugar and take his medicine to get better control of his blood sugars.  His most recent A1C was 11.9%. He verbalized motivation to get control of his diabetes and use blood sugar values to adjust his diet and activity.  Preferred Learning Style: No preference indicated  Learning Readiness:  Ready and Change in progress  ANTHROPOMETRICS:, BMI 34.8- obese class I WEIGHT HISTORY:220#-240# for past 8 years SLEEP:noy assessed today MEDICATIONS: glipizide and victoza BLOOD SUGAR: . Fasting CBgs the highest- were 130-140 late sept, this past week 150-206 range, lower later in the day 114,113, 117, 107, bedtime 100/101/164/136 DIETARY INTAKE: Usual eating pattern includes 3 meals and 3 snacks per day.  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   Nutritional Diagnosis:  NB-1.1 Food and nutrition-related knowledge deficit As related to lakc of diabetes meal planning education in past.  As evidenced by his rport and questions.    Intervention:  Nutrition education about diabetes meal planning, pattern managment an importance of keep carbs consistent. . Coordination of care: increase victoza as needed to 1.8 to maximize weight loss and blood sugar lowering potential.  Teaching Method Utilized: Visual, Auditory, Hands on Handouts given during visit include: how  to control blood sugar, target blood sugars, logbook Barriers to learning/adherence to lifestyle change: competing values/support Demonstrated degree of understanding via:  Teach Back   Monitoring/Evaluation:  Dietary intake, exercise, meter, and body weight in 4 week(s).

## 2016-02-10 NOTE — Patient Instructions (Signed)
[name] it was nice seeing you today.  Continue taking the following blood pressure medications:  -Amlodipine 5 mg daily  -Lisinopril 20 mg daily

## 2016-02-11 NOTE — Assessment & Plan Note (Signed)
BP Readings from Last 3 Encounters:  02/10/16 (!) 146/79  01/27/16 (!) 149/89  01/12/16 126/88    Lab Results  Component Value Date   NA 133 (L) 01/12/2016   K 4.1 01/12/2016   CREATININE 1.24 01/12/2016    Assessment: Blood pressure control:  above goal (less than 140/90) Comments: Patient's current medication regimen includes amlodipine 5 mg daily and lisinopril 20 mg daily. Going to his medication bag, it is discovered that the patient is taking lisinopril 10 mg daily instead (bottle from previous prescription).  Plan: Medications: Continue amlodipine 5 mg daily. Advised patient to take 2 tablets of lisinopril 10 mg daily until he runs out and then pick up the new prescription for one tablet of lisinopril 20 mg daily from his pharmacy. Educational resources provided:   Educated patient about healthy eating and exercise.  Other plans:  -Return to the clinic in 1 month  -If blood pressure continues to be elevated at next visit, consider increasing dose of amlodipine

## 2016-02-11 NOTE — Progress Notes (Signed)
   CC: Patient is here for a blood pressure recheck.  HPI:  [name] is a 60 y.o. male with a past medical history of conditions listed below is entering to the clinic for follow-up of his hypertension. Please see problem based charting for the status of the patient's chronic medical conditions.   Past Medical History:  Diagnosis Date  . Club foot of both lower extremities 1958  . Coronary artery disease    Holter monitor 02/2012 - sinus with rare PVC  . CTEV (congenital talipes equinovarus)   . Gout   . Hyperlipidemia   . Hypertension   . Myocardial infarction ~ 2007   "mild"  . Osteomyelitis (HCC)   . Pneumonia 2000's   "couple times"  . Type II diabetes mellitus (HCC) 2011    Review of Systems:  Pertinent positives mentioned in HPI. Remainder of all ROS negative.   Physical Exam:  Vitals:   02/10/16 1320  BP: (!) 146/79  Pulse: 89  Temp: 98.4 F (36.9 C)  TempSrc: Oral  SpO2: 99%  Weight: 229 lb 1.6 oz (103.9 kg)  Height: 5\' 8"  (1.727 m)   Physical Exam  Constitutional: He is oriented to person, place, and time. He appears well-developed and well-nourished. No distress.  HENT:  Head: Normocephalic and atraumatic.  Eyes: EOM are normal.  Neck: Neck supple. No tracheal deviation present.  Cardiovascular: Normal rate, regular rhythm and intact distal pulses.   Pulmonary/Chest: Effort normal and breath sounds normal. No respiratory distress.  Abdominal: Soft. Bowel sounds are normal. He exhibits no distension. There is no tenderness. There is no guarding.  Musculoskeletal: Normal range of motion. He exhibits no edema.  Neurological: He is alert and oriented to person, place, and time.  Skin: Skin is warm and dry.    Assessment & Plan:   See Encounters Tab for problem based charting.  Patient discussed with Dr. Josem KaufmannKlima

## 2016-02-11 NOTE — Patient Instructions (Addendum)
Good job controlling your bloods sugars.   Think about why they might be higher in the mornings over the past week   Consider trying to eat earlier a few days to see if that makes a difference.   See you November 14th!   Call in the meantime if you have questions or concerns   Lupita LeashDonna [phone]

## 2016-02-12 NOTE — Progress Notes (Signed)
Patient ID: Daniel Perkins,Daniel Perkins male   DOB: 05/23/1955, 60 y.o.   MRN: [id number]  Case discussed with Dr. Loney Lohathore at the time of the visit.  We reviewed the resident's history and exam and pertinent patient test results.  I agree with the assessment, diagnosis and plan of care documented in the resident's note.

## 2016-02-17 ENCOUNTER — Other Ambulatory Visit: Payer: Self-pay | Admitting: *Deleted

## 2016-02-17 DIAGNOSIS — E119 Type 2 diabetes mellitus without complications: Secondary | ICD-10-CM | POA: Diagnosis not present

## 2016-02-17 DIAGNOSIS — Z01818 Encounter for other preprocedural examination: Secondary | ICD-10-CM | POA: Diagnosis not present

## 2016-02-17 DIAGNOSIS — Z86010 Personal history of colonic polyps: Secondary | ICD-10-CM | POA: Diagnosis not present

## 2016-02-17 MED ORDER — LIRAGLUTIDE 18 MG/3ML ~~LOC~~ SOPN: 1.2000 mg | PEN_INJECTOR | Freq: Every day | SUBCUTANEOUS | 2 refills | Status: DC

## 2016-03-03 ENCOUNTER — Emergency Department (HOSPITAL_COMMUNITY)
Admission: EM | Admit: 2016-03-03 | Discharge: 2016-03-03 | Disposition: A | Discharge status: Home / Self Care | Payer: Medicare Other | Attending: Emergency Medicine | Admitting: Emergency Medicine

## 2016-03-03 ENCOUNTER — Emergency Department (HOSPITAL_COMMUNITY): Payer: Medicare Other

## 2016-03-03 ENCOUNTER — Encounter (HOSPITAL_COMMUNITY): Payer: Self-pay

## 2016-03-03 DIAGNOSIS — Z794 Long term (current) use of insulin: Secondary | ICD-10-CM | POA: Diagnosis not present

## 2016-03-03 DIAGNOSIS — Y92009 Unspecified place in unspecified non-institutional (private) residence as the place of occurrence of the external cause: Secondary | ICD-10-CM | POA: Insufficient documentation

## 2016-03-03 DIAGNOSIS — Z7982 Long term (current) use of aspirin: Secondary | ICD-10-CM | POA: Insufficient documentation

## 2016-03-03 DIAGNOSIS — T148XXA Other injury of unspecified body region, initial encounter: Secondary | ICD-10-CM | POA: Diagnosis not present

## 2016-03-03 DIAGNOSIS — I2510 Atherosclerotic heart disease of native coronary artery without angina pectoris: Secondary | ICD-10-CM | POA: Insufficient documentation

## 2016-03-03 DIAGNOSIS — M7989 Other specified soft tissue disorders: Secondary | ICD-10-CM | POA: Diagnosis not present

## 2016-03-03 DIAGNOSIS — W230XXA Caught, crushed, jammed, or pinched between moving objects, initial encounter: Secondary | ICD-10-CM | POA: Diagnosis not present

## 2016-03-03 DIAGNOSIS — Z79899 Other long term (current) drug therapy: Secondary | ICD-10-CM | POA: Insufficient documentation

## 2016-03-03 DIAGNOSIS — Y939 Activity, unspecified: Secondary | ICD-10-CM | POA: Insufficient documentation

## 2016-03-03 DIAGNOSIS — Y999 Unspecified external cause status: Secondary | ICD-10-CM | POA: Diagnosis not present

## 2016-03-03 DIAGNOSIS — I252 Old myocardial infarction: Secondary | ICD-10-CM | POA: Diagnosis not present

## 2016-03-03 DIAGNOSIS — E119 Type 2 diabetes mellitus without complications: Secondary | ICD-10-CM | POA: Diagnosis not present

## 2016-03-03 DIAGNOSIS — I10 Essential (primary) hypertension: Secondary | ICD-10-CM | POA: Insufficient documentation

## 2016-03-03 DIAGNOSIS — Z87891 Personal history of nicotine dependence: Secondary | ICD-10-CM | POA: Insufficient documentation

## 2016-03-03 DIAGNOSIS — S6992XA Unspecified injury of left wrist, hand and finger(s), initial encounter: Secondary | ICD-10-CM | POA: Diagnosis present

## 2016-03-03 DIAGNOSIS — M79642 Pain in left hand: Secondary | ICD-10-CM | POA: Diagnosis not present

## 2016-03-03 DIAGNOSIS — Z7984 Long term (current) use of oral hypoglycemic drugs: Secondary | ICD-10-CM | POA: Insufficient documentation

## 2016-03-03 DIAGNOSIS — S60222A Contusion of left hand, initial encounter: Secondary | ICD-10-CM | POA: Insufficient documentation

## 2016-03-03 NOTE — ED Notes (Signed)
Papers reviewed, ace wrap applied, and patient verbalizes understanding of home care treatments.

## 2016-03-03 NOTE — ED Provider Notes (Signed)
[place] DEPT Provider Note   CSN: [id number] Arrival date & time: 03/03/16  0762   By signing my name below, I, Evelene Croon, attest that this documentation has been prepared under the direction and in the presence of non-physician practitioner, Jeannett Senior, PA-C. Electronically Signed: Evelene Croon, Scribe. 03/03/2016. 9:19 AM.   History   Chief Complaint Chief Complaint  Patient presents with  . Hand Injury   The history is provided by the patient. No language interpreter was used.    HPI Comments:  Daniel Perkins is a 60 y.o. male who presents to the Emergency Department complaining of constant pain to the left hand s/p injury today ~ 0630 this AM. He states he caught the hand in a door.  He reports associated swelling to the site. No alleviating factors noted, no treatments tried prior to arrival. No use of blood thinners other than ASA. Pt has no other complaints or symptoms at this time.    Past Medical History:  Diagnosis Date  . Club foot of both lower extremities 1958  . Coronary artery disease    Holter monitor 02/2012 - sinus with rare PVC  . CTEV (congenital talipes equinovarus)   . Gout   . Hyperlipidemia   . Hypertension   . Myocardial infarction ~ 2007   "mild"  . Osteomyelitis ([place])   . Pneumonia 2000's   "couple times"  . Type II diabetes mellitus ([place]) 2011    Patient Active Problem List   Diagnosis Date Noted  . Pulsatile mass of left upper extremity 06/19/2015  . Cough present for greater than 3 weeks 03/19/2015  . OSA (obstructive sleep apnea) 10/01/2014  . Chest pain 09/24/2014  . Right knee pain 07/01/2014  . Peripheral vascular disease ([place]) 02/10/2014  . Muscle cramps 10/03/2013  . Microcytic anemia 02/06/2013  . CTEV (congenital talipes equinovarus)   . Statin intolerance 06/29/2012  . Esophageal reflux 06/29/2012  . Health care maintenance 06/28/2012  . Gout 06/12/2012  . Obesity, Class II, BMI 35-39.9 02/20/2012    . CAD (coronary artery disease) 08/18/2010  . Diabetes ([place]) 09/06/2009  . ERECTILE DYSFUNCTION, NON-ORGANIC 02/16/2009  . HYPERLIPIDEMIA 01/14/2009  . HYPERTENSION 01/14/2009    Past Surgical History:  Procedure Laterality Date  . ANTERIOR CERVICAL DECOMP/DISCECTOMY FUSION  X 2  . CARDIAC CATHETERIZATION    . FOOT FRACTURE SURGERY Right 1990's   "pt a steel plate in"  . FOOT SURGERY Left x16   "joints collapsed; keep getting infected"  . POSTERIOR CERVICAL FUSION/FORAMINOTOMY  X 2       Home Medications    Prior to Admission medications   Medication Sig Start Date End Date Taking? Authorizing Provider  acetaminophen (TYLENOL) 500 MG tablet Take 1,000 mg by mouth every 6 (six) hours as needed for moderate pain (gout).    Historical Provider, MD  allopurinol (ZYLOPRIM) 100 MG tablet TAKE ONE TABLET BY MOUTH ONCE DAILY 12/28/15   Shela Leff, MD  amLODipine (NORVASC) 5 MG tablet Take 1 tablet (5 mg total) by mouth daily. 02/10/16   Shela Leff, MD  aspirin EC 81 MG tablet Take 1 tablet (81 mg total) by mouth daily. Patient not taking: Reported on 01/12/2016 03/19/15   Burgess Estelle, MD  Blood Glucose Monitoring Suppl (ONE TOUCH ULTRA 2) W/DEVICE KIT Check blood sugar twice daily as instructed dx code 250.00 07/10/13   Jessee Avers, MD  colchicine 0.6 MG tablet Take 1 tablet (0.6 mg total) by mouth daily as needed (gout). For  gout flare 09/28/15 09/27/16  Dorie Rank, MD  diphenhydramine-acetaminophen (TYLENOL PM) 25-500 MG TABS tablet Take 1-2 tablets by mouth 2 (two) times daily as needed (pain).    Historical Provider, MD  glipiZIDE (GLUCOTROL) 5 MG tablet Take 1 tablet (5 mg total) by mouth 2 (two) times daily before a meal. 01/12/16 01/11/17  Shela Leff, MD  glucose blood (ONE TOUCH ULTRA TEST) test strip Check blood sugar 3 times a day as instructed 01/12/16   Shela Leff, MD  Insulin Pen Needle 31G X 5 MM MISC Use to inject liraglutide once daily as instructed Dx  E11.9 01/14/16   Shela Leff, MD  LANCETS ULTRA THIN 30G MISC Use to check your blood sugar 3 times a day as instructed. 01/12/16   Shela Leff, MD  Liraglutide 18 MG/3ML SOPN Inject 0.1 mLs (0.6 mg total) into the skin daily. Use this dose for 1 week, then start using higher dose as instructed. 01/12/16   Shela Leff, MD  liraglutide 18 MG/3ML SOPN Inject 0.2 mLs (1.2 mg total) into the skin daily. 02/17/16   Shela Leff, MD  lisinopril (PRINIVIL,ZESTRIL) 20 MG tablet TAKE ONE TABLET BY MOUTH ONCE DAILY 01/01/16   Shela Leff, MD  naproxen (NAPROSYN) 375 MG tablet Take 1 tablet (375 mg total) by mouth 2 (two) times daily. Patient not taking: Reported on 01/12/2016 06/14/15   Margarita Mail, PA-C    Family History Family History  Problem Relation Age of Onset  . Diabetes Mother   . Coronary artery disease Mother     HAS PACEMAKER  . Heart disease Mother   . Heart attack Brother     Social History Social History  Substance Use Topics  . Smoking status: Former Smoker    Packs/day: 1.00    Years: 32.00    Types: Cigarettes    Quit date: 07/06/2007  . Smokeless tobacco: Never Used  . Alcohol use 0.0 oz/week     Comment: Sometimes.     Allergies   Morphine and Pravastatin sodium   Review of Systems Review of Systems  Musculoskeletal: Positive for arthralgias, joint swelling and myalgias.       Left hand   Neurological: Negative for weakness.     Physical Exam Updated Vital Signs BP 140/86 (BP Location: Right Arm)   Pulse 93   Temp 98.3 F (36.8 C) (Oral)   Resp 16   Ht '5\' 8"'  (1.727 m)   Wt 230 lb (104.3 kg)   SpO2 99%   BMI 34.97 kg/m   Physical Exam  Constitutional: He is oriented to person, place, and time. He appears well-developed and well-nourished. No distress.  HENT:  Head: Normocephalic and atraumatic.  Eyes: Conjunctivae are normal.  Cardiovascular: Normal rate.   Pulmonary/Chest: Effort normal.  Abdominal: He exhibits no  distension.  Musculoskeletal:  Swelling noted to the dorsal left hand, specifically over second and third metacarpals. Pain with range of motion of the second and third digits at MCP joints. Sensation intact distally in all fingers. Cap refill less than 2 seconds distally. Normal rest of the hand and wrist.  Neurological: He is alert and oriented to person, place, and time.  Skin: Skin is warm and dry.  Psychiatric: He has a normal mood and affect.  Nursing note and vitals reviewed.    ED Treatments / Results  DIAGNOSTIC STUDIES:  Oxygen Saturation is 99% on RA, normal by my interpretation.    COORDINATION OF CARE:  9:18 AM Discussed treatment plan with  pt at bedside and pt agreed to plan.  Labs (all labs ordered are listed, but only abnormal results are displayed) Labs Reviewed - No data to display  EKG  EKG Interpretation None       Radiology Dg Hand Complete Left  Result Date: 03/03/2016 CLINICAL DATA:  Left hand injury, pain, soft tissue swelling over the second metacarpal. EXAM: LEFT HAND - COMPLETE 3+ VIEW COMPARISON:  09/28/2015 FINDINGS: Dorsal soft tissue swelling noted on the lateral view overlying the metacarpals. No underlying acute osseous finding, fracture, or malalignment. No joint abnormality. Degenerative changes noted at the wrist involving the distal radial ulnar joint. IMPRESSION: Dorsal soft tissue swelling.  No acute osseous finding or fracture. Electronically Signed   By: Jerilynn Mages.  Shick M.D.   On: 03/03/2016 09:18    Procedures Procedures (including critical care time)  Medications Ordered in ED Medications - No data to display   Initial Impression / Assessment and Plan / ED Course  I have reviewed the triage vital signs and the nursing notes.  Pertinent labs & imaging results that were available during my care of the patient were reviewed by me and considered in my medical decision making (see chart for details).  Clinical Course      Patient  X-Ray negative for obvious fracture or dislocation.  Swelling is most likely a hematoma. Patient given ace wrap  while in ED, conservative therapy recommended and discussed. Ice and elevation. Patient will be discharged home & is agreeable with above plan. Returns precautions discussed. Pt appears safe for discharge.  Vitals:   03/03/16 0830  BP: 140/86  Pulse: 93  Resp: 16  Temp: 98.3 F (36.8 C)  TempSrc: Oral  SpO2: 99%  Weight: 104.3 kg  Height: '5\' 8"'  (1.727 m)    Final Clinical Impressions(s) / ED Diagnoses   Final diagnoses:  Contusion of left hand, initial encounter  Hematoma    New Prescriptions New Prescriptions   No medications on file   I personally performed the services described in this documentation, which was scribed in my presence. The recorded information has been reviewed and is accurate.     Jeannett Senior, PA-C 03/03/16 [place], MD 03/03/16 2351

## 2016-03-03 NOTE — Discharge Instructions (Signed)
Ice and elevate your hand when at home. Ace wrap for compression. Follow-up with family doctor or orthopedic specialist if pain is not improving in 3-5 days.

## 2016-03-03 NOTE — ED Triage Notes (Signed)
Pt reports he slammed his hand in the door to his house. Hand appears swollen over the left index finger. Knot noted. Strong radial pulse noted.

## 2016-03-07 NOTE — Addendum Note (Signed)
Addended by: Dorie RankPOWERS, Shavonn Convey E on: 03/07/2016 07:05 PM   Modules accepted: Orders

## 2016-03-22 ENCOUNTER — Other Ambulatory Visit: Payer: Self-pay | Admitting: Internal Medicine

## 2016-03-22 ENCOUNTER — Ambulatory Visit (INDEPENDENT_AMBULATORY_CARE_PROVIDER_SITE_OTHER): Payer: Medicare Other | Admitting: Dietician

## 2016-03-22 ENCOUNTER — Ambulatory Visit (INDEPENDENT_AMBULATORY_CARE_PROVIDER_SITE_OTHER): Payer: Medicare Other | Admitting: Internal Medicine

## 2016-03-22 VITALS — BP 140/80 | HR 94 | Temp 97.6°F | Ht 69.6 in | Wt 234.3 lb

## 2016-03-22 DIAGNOSIS — I10 Essential (primary) hypertension: Secondary | ICD-10-CM

## 2016-03-22 DIAGNOSIS — E669 Obesity, unspecified: Secondary | ICD-10-CM | POA: Diagnosis not present

## 2016-03-22 DIAGNOSIS — L989 Disorder of the skin and subcutaneous tissue, unspecified: Secondary | ICD-10-CM

## 2016-03-22 DIAGNOSIS — E785 Hyperlipidemia, unspecified: Secondary | ICD-10-CM | POA: Diagnosis not present

## 2016-03-22 DIAGNOSIS — R220 Localized swelling, mass and lump, head: Secondary | ICD-10-CM

## 2016-03-22 DIAGNOSIS — E119 Type 2 diabetes mellitus without complications: Secondary | ICD-10-CM

## 2016-03-22 DIAGNOSIS — I25119 Atherosclerotic heart disease of native coronary artery with unspecified angina pectoris: Secondary | ICD-10-CM

## 2016-03-22 DIAGNOSIS — Z713 Dietary counseling and surveillance: Secondary | ICD-10-CM

## 2016-03-22 DIAGNOSIS — Z7984 Long term (current) use of oral hypoglycemic drugs: Secondary | ICD-10-CM

## 2016-03-22 DIAGNOSIS — Z0000 Encounter for general adult medical examination without abnormal findings: Secondary | ICD-10-CM

## 2016-03-22 DIAGNOSIS — I2510 Atherosclerotic heart disease of native coronary artery without angina pectoris: Secondary | ICD-10-CM

## 2016-03-22 DIAGNOSIS — Z7982 Long term (current) use of aspirin: Secondary | ICD-10-CM

## 2016-03-22 DIAGNOSIS — Z6835 Body mass index (BMI) 35.0-35.9, adult: Secondary | ICD-10-CM | POA: Diagnosis not present

## 2016-03-22 DIAGNOSIS — E1165 Type 2 diabetes mellitus with hyperglycemia: Secondary | ICD-10-CM

## 2016-03-22 DIAGNOSIS — Z87891 Personal history of nicotine dependence: Secondary | ICD-10-CM

## 2016-03-22 DIAGNOSIS — Z79899 Other long term (current) drug therapy: Secondary | ICD-10-CM

## 2016-03-22 LAB — GLUCOSE, CAPILLARY: Glucose-Capillary: 113 mg/dL — ABNORMAL HIGH (ref 65–99)

## 2016-03-22 MED ORDER — CARVEDILOL 6.25 MG PO TABS: 6.2500 mg | ORAL_TABLET | Freq: Two times a day (BID) | ORAL | 2 refills | Status: DC

## 2016-03-22 NOTE — Patient Instructions (Signed)
Good Job decreasing your blood sugar!!! It was 113 today- that is in target!  Way to go that you started walking every day with your father!  Keep eating more veggies and drinking diet green tea!  Please make a follow up in 4 weeks

## 2016-03-22 NOTE — Progress Notes (Signed)
  Medical Nutrition Therapy:  Appt start time: 1330 end time:  1400. Visit # 2  Assessment:  Primary concerns today: blood sugar control.  [name] has been trying to decrease his portions, check his blood sugar and take his medicine to get better control of his blood sugars. 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   Nutritional Diagnosis:  NB-1.1 Food and nutrition-related knowledge deficit As related to lack of diabetes meal planning education in past is improving as evidenced by his report and questions.    Intervention:  Nutrition education about diabetes meal planning, hypoglycemia prevention and treatments and how victoza works. Coordination of care: increase victoza as needed to 1.8 to maximize weight loss and blood sugar lowering potential. Would be ideal to be able to stop glipizide as it has side effect of hypoglycemia  Teaching Method Utilized: Visual, Auditory, Hands on Handouts given during visit include: how to control blood sugar, target blood sugars, logbook Barriers to learning/adherence to lifestyle change: competing values/support Demonstrated degree of understanding via:  Teach Back   Monitoring/Evaluation:  Dietary intake, exercise, meter, and body weight in 4 week(s)   Perkins, Daniel Leashonna, RD 03/22/2016 .4:08 PM

## 2016-03-22 NOTE — Patient Instructions (Signed)
[name] it was nice seeing you today.  -Start taking Coreg as instructed  -I have ordered some labs and will call you when I get the results.

## 2016-03-23 DIAGNOSIS — L989 Disorder of the skin and subcutaneous tissue, unspecified: Secondary | ICD-10-CM | POA: Insufficient documentation

## 2016-03-23 LAB — LIPID PANEL
CHOLESTEROL TOTAL: 220 mg/dL — AB (ref 100–199)
Chol/HDL Ratio: 6.5 ratio units — ABNORMAL HIGH (ref 0.0–5.0)
HDL: 34 mg/dL — ABNORMAL LOW (ref >39)
LDL Calculated: 148 mg/dL — ABNORMAL HIGH (ref 0–99)
TRIGLYCERIDES: 191 mg/dL — AB (ref 0–149)
VLDL CHOLESTEROL CAL: 38 mg/dL (ref 5–40)

## 2016-03-23 LAB — HEMOGLOBIN A1C
Est. average glucose Bld gHb Est-mCnc: 252 mg/dL
Hgb A1c MFr Bld: 10.4 % — ABNORMAL HIGH (ref 4.8–5.6)

## 2016-03-23 LAB — HEPATITIS C ANTIBODY

## 2016-03-23 NOTE — Progress Notes (Signed)
   CC: Patient is complaining of a warm feeling in his chest.  HPI:  Daniel Perkins is a 60 y.o. male with a past medical history of conditions listed below presenting to the clinic complaining of a warm feeling in his chest. Other medical conditions including hypertension, diabetes, and hyperlipidemia were also discussed during this visit. Please see problem based charting for the status of the patient's current and chronic medical conditions.   Past Medical History:  Diagnosis Date  . Club foot of both lower extremities 1958  . Coronary artery disease    Holter monitor 02/2012 - sinus with rare PVC  . CTEV (congenital talipes equinovarus)   . Gout   . Hyperlipidemia   . Hypertension   . Myocardial infarction ~ 2007   "mild"  . Osteomyelitis (HCC)   . Pneumonia 2000's   "couple times"  . Type II diabetes mellitus (HCC) 2011    Review of Systems:  Pertinent positives mentioned in HPI. Remainder of all ROS negative.   Physical Exam:  Vitals:   03/22/16 1416 03/22/16 1428  BP: (!) 151/94 140/80  Pulse: 94   Temp: 97.6 F (36.4 C)   TempSrc: Oral   SpO2: 100%   Weight: 234 lb 4.8 oz (106.3 kg)   Height: 5' 9.6" (1.768 m)    Physical Exam  Constitutional: He is oriented to person, place, and time. He appears well-developed and well-nourished. No distress.  HENT:  Head: Normocephalic and atraumatic.  Mouth/Throat: Oropharynx is clear and moist.  Nose: 1.5-2 mm round fibrous nodule noted on the inner surface of the right ala.   Eyes: EOM are normal.  Neck: Neck supple. No tracheal deviation present.  Cardiovascular: Normal rate, regular rhythm and intact distal pulses.   Pulmonary/Chest: Effort normal and breath sounds normal. No respiratory distress. He has no wheezes. He has no rales.  Abdominal: Soft. Bowel sounds are normal. He exhibits no distension. There is no tenderness. There is no guarding.  Musculoskeletal: Normal range of motion. He exhibits no edema.    Neurological: He is alert and oriented to person, place, and time.  Skin: Skin is warm and dry.    Assessment & Plan:   See Encounters Tab for problem based charting.  Patient discussed with Dr. Josem KaufmannKlima

## 2016-03-23 NOTE — Assessment & Plan Note (Addendum)
BP Readings from Last 3 Encounters:  03/22/16 140/80  03/03/16 140/86  02/10/16 (!) 146/79    Lab Results  Component Value Date   NA 133 (L) 01/12/2016   K 4.1 01/12/2016   CREATININE 1.24 01/12/2016    Assessment: Blood pressure control:  at goal (borderline) Comments: Currently taking amlodipine 5 mg daily and lisinopril 20 mg daily.  Plan: Medications:  continue current medications. Start Coreg 6.25 mg twice daily as patient has a history of coronary artery disease. Educational resources provided:   Educated patient about healthy eating and exercise. Emphasized the importance of weight loss.

## 2016-03-23 NOTE — Assessment & Plan Note (Addendum)
Lab Results  Component Value Date   HGBA1C 10.4 (H) 03/22/2016   HGBA1C 11.9 01/12/2016   HGBA1C 8.3 06/19/2015     Assessment: Diabetes control:  uncontrolled (above goal A1c of less than 7) Progress toward A1C goal:   improvement Comments: Current medication regimen includes glipizide 5 mg twice daily and Victoza 1.2 mg daily. CBG 113 at this visit. Patient has gained 4 pounds in the past 2-3 weeks.   Plan: Medications:  continue current medications. Emphasized the importance of weight loss. Instruction/counseling given: reminded to get eye exam, reminded to bring blood glucose meter & log to each visit, reminded to bring medications to each visit, discussed foot care, discussed the need for weight loss and discussed diet Other plans:  -Repeat A1c in 3 months

## 2016-03-23 NOTE — Assessment & Plan Note (Addendum)
Assessment Patient is not currently on a statin. Per chart review, he has a history of statin intolerance-was hospitalized in 2012 for muscle cramps thought to be secondary to volume depletion in the setting of statin use. Chart review shows he has been tried on different doses of statin (pravastatin) but has not been able to tolerate it in the past. Labs at this visit showing cholesterol 220, triglycerides 191, HDL 34, and LDL 148. Patient's 10 year ASCVD risk score is 30.4%.   Plan -I tried calling the patient discussed the lab results but was not able to reach him over the phone. Will try calling again to discuss risks and benefits of statin therapy vs. lifestyle modification. If patient agrees to trying statin therapy again, may consider starting him on low-dose fluvastatin (20 mg daily) as it has the lowest risk of myopathy (per literature review from [facility]).   Addendum 03/25/16: Tried calling the patient but was not able to reach him over the phone. Will try again on Monday.   Addendum 03/29/16: Tried calling the patient but was not able to reach him over the phone again. This was my third attempt. Patient is due for a follow-up in 3 weeks. Will discuss at next visit. Will message front desk to make sure he is scheduled an appointment.

## 2016-03-23 NOTE — Assessment & Plan Note (Addendum)
History of present illness Patient reports having a warm feeling rushing across his chest bilaterally and shortness of breath with exertion for the past few weeks. Reports having symptoms on a daily basis. Also reports having associated lightheadedness. States his symptoms improve after he sits down and rests for 20 seconds. Denies having any symptoms at rest. Denies having any chest pain, fevers, chills, or cough. Denies having any GERD symptoms. Denies any history of asthma or COPD. States he quit smoking 8 years ago. Denies having any symptoms at present.  Assessment Atypical chest pain. Patient is asymptomatic at present and vitals are stable. Cardiac cath done 07/2009 showing mild nonobstructive coronary artery disease. Stress test done in July 2016 was low risk. Per cardiology note from July 2016, patient was started on Coreg 6.25 mg twice daily but patient does not recall ever taking this medication and has not followed up with cardiology since then. He does admit to taking aspirin daily.  Plan -Start Coreg 6.25 mg twice daily -Continue aspirin 81 mg daily -Advised patient to follow-up with cardiology

## 2016-03-23 NOTE — Assessment & Plan Note (Signed)
Hep C antibody negative.

## 2016-03-23 NOTE — Assessment & Plan Note (Signed)
History of present illness Patient reports having a bump on the inner surface of his right nostril for the past few months. States it has not changed in size. Denies noticing any drainage from his nose.  Assessment 1.5-2 mm fibrous nodule noted on the inner surface of the right ala.  Plan -Advised patient to inform us if the nodule changes size or appearance, as in that case, it will have to be excised and biopsied.

## 2016-03-24 NOTE — Progress Notes (Signed)
Case discussed with Dr. Rathore at the time of the visit.  We reviewed the resident's history and exam and pertinent patient test results.  I agree with the assessment, diagnosis, and plan of care documented in the resident's note. 

## 2016-05-05 ENCOUNTER — Other Ambulatory Visit: Payer: Self-pay

## 2016-05-05 NOTE — Telephone Encounter (Signed)
Called insulin needles in to pharmacy called  Patient to make he aware father states he will give him the message

## 2016-05-05 NOTE — Telephone Encounter (Signed)
Insulin Pen Needle 31G X 5 MM MISC, refill request @ [facility] [address].

## 2016-05-15 IMAGING — CR DG WRIST COMPLETE 3+V*L*
4 series · 4 of 4 positions shown · non-contrast
Comparison: None.

CLINICAL DATA: Pain and swelling

EXAM:
LEFT WRIST - COMPLETE 3+ VIEW

[x wrist pa left]
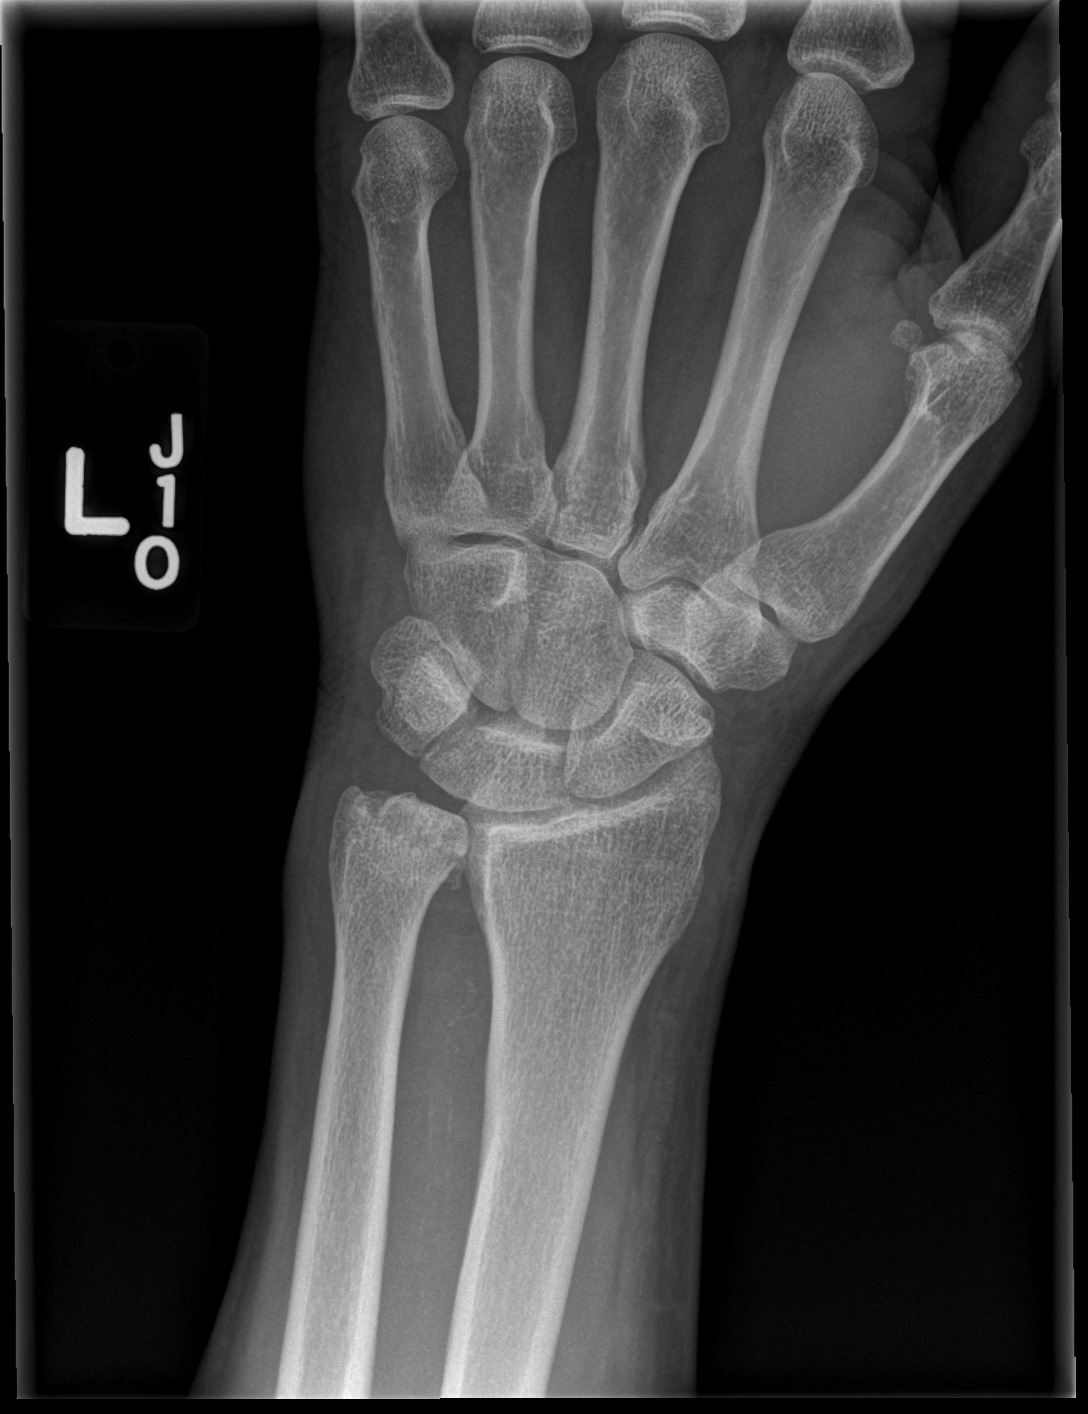

[x wrist obl left]
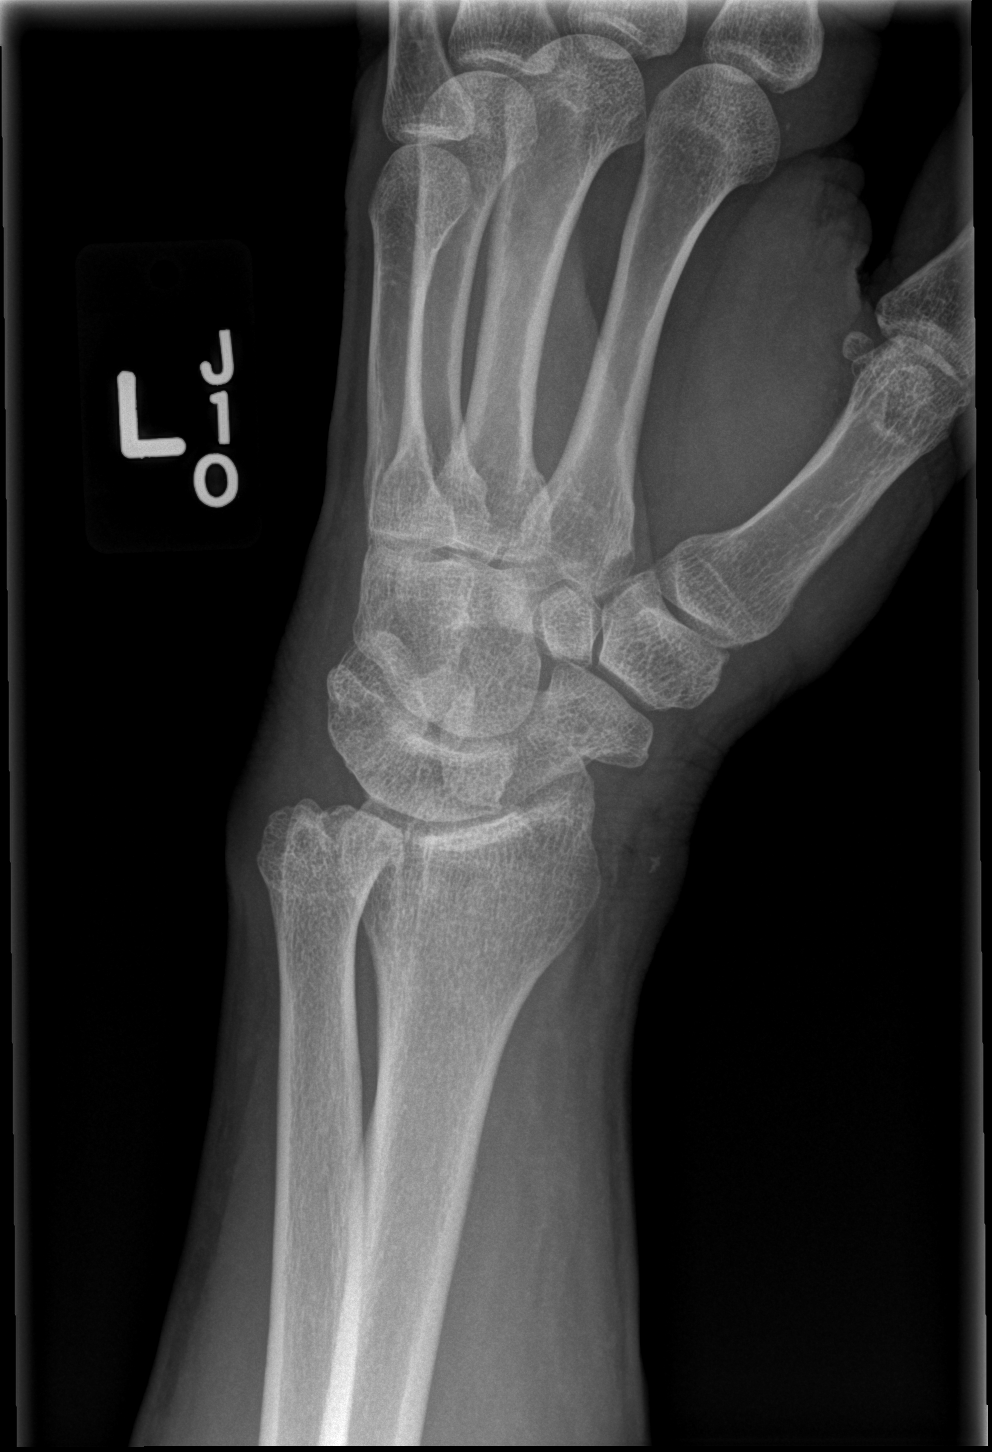

[x wrist lat left]
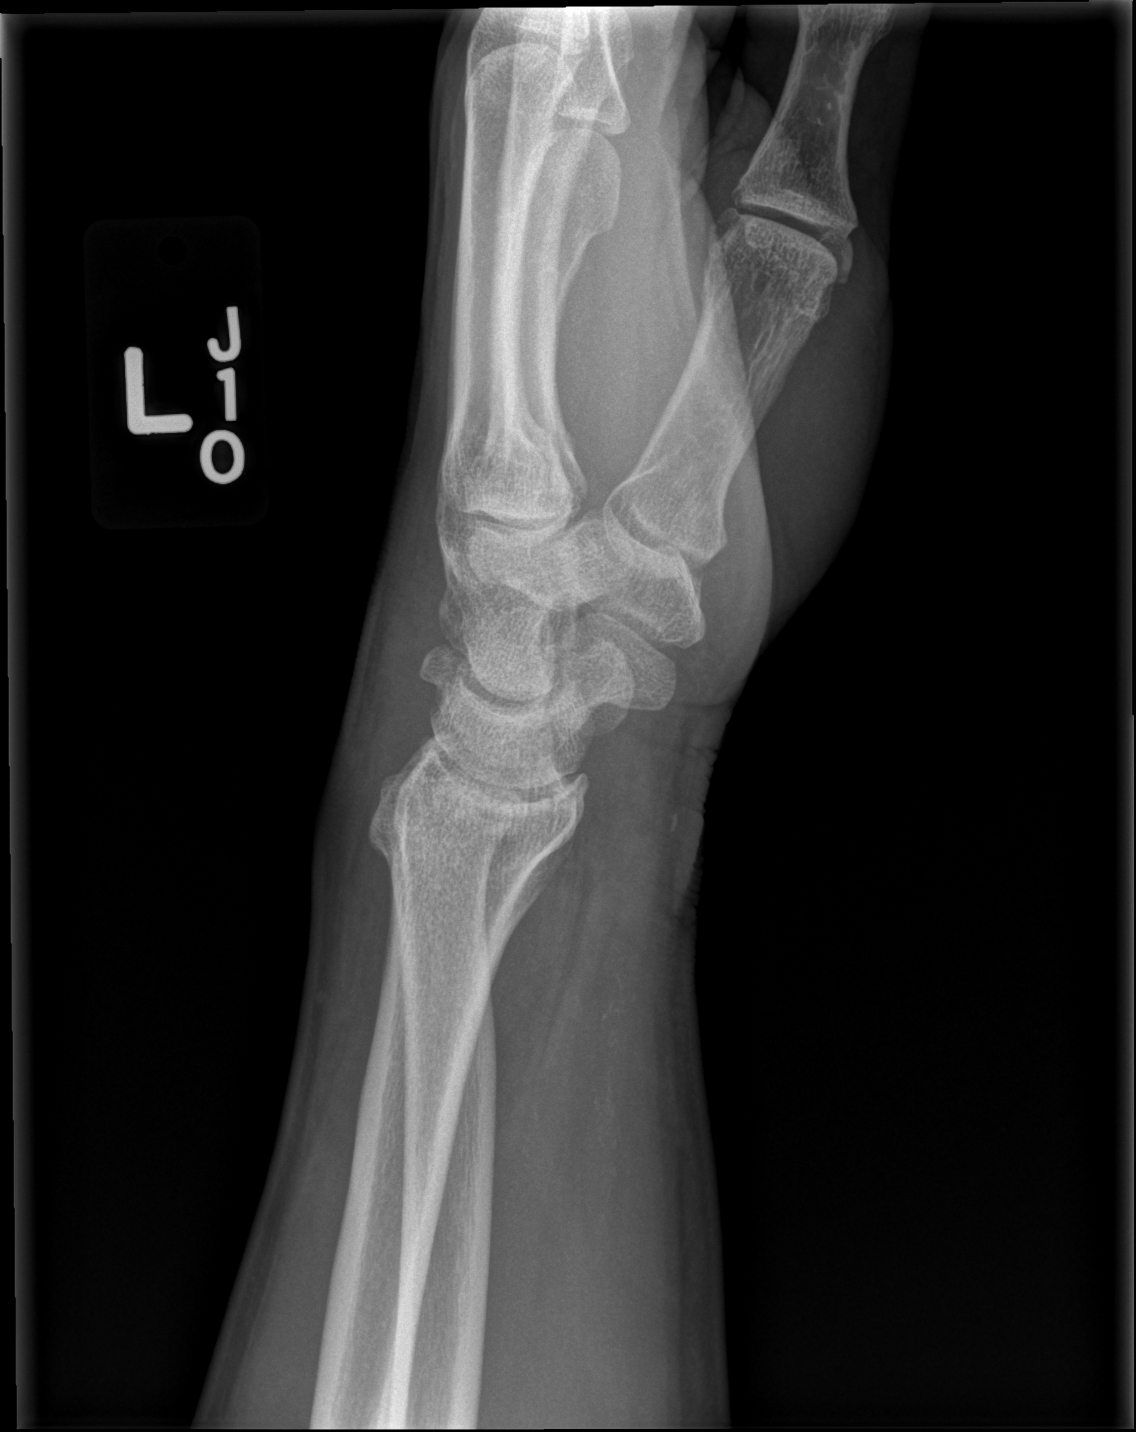

[x wrist navicular view left]
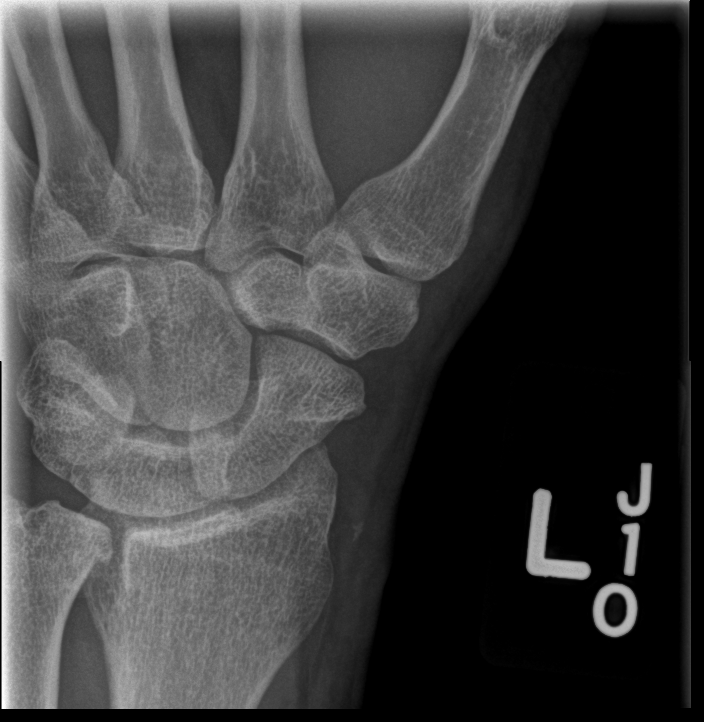

[4 of 4 positions shown; findings below may reference images not displayed]

FINDINGS: Irregularity of the distal ulna most likely due to old injury.
Negative for acute fracture. No erosion. Carpal bones are normal.

Early arterial calcification.
IMPRESSION: Negative for acute fracture.

## 2016-06-23 ENCOUNTER — Ambulatory Visit: Payer: Medicare Other | Admitting: Pharmacist

## 2016-06-23 ENCOUNTER — Encounter (INDEPENDENT_AMBULATORY_CARE_PROVIDER_SITE_OTHER): Payer: Self-pay

## 2016-06-23 DIAGNOSIS — Z79899 Other long term (current) drug therapy: Secondary | ICD-10-CM

## 2016-06-23 NOTE — Progress Notes (Signed)
S: Daniel Perkins is a 61 y.o. male reports to clinical pharmacist appointment for diabetes medication management. Patient did not bring medication bottles.   Allergies  Allergen Reactions  . Morphine Itching  . Pravastatin Sodium Other (See Comments)    Severe muscle cramps with one time requiring admission.    Medication Sig  acetaminophen (TYLENOL) 500 MG tablet Take 1,000 mg by mouth every 6 (six) hours as needed for moderate pain (gout).  allopurinol (ZYLOPRIM) 100 MG tablet TAKE ONE TABLET BY MOUTH ONCE DAILY  amLODipine (NORVASC) 5 MG tablet Take 1 tablet (5 mg total) by mouth daily.  aspirin EC 81 MG tablet Take 1 tablet (81 mg total) by mouth daily.  Blood Glucose Monitoring Suppl (ONE TOUCH ULTRA 2) W/DEVICE KIT Check blood sugar twice daily as instructed dx code 250.00  carvedilol (COREG) 6.25 MG tablet Take 1 tablet (6.25 mg total) by mouth 2 (two) times daily.  colchicine 0.6 MG tablet Take 1 tablet (0.6 mg total) by mouth daily as needed (gout). For gout flare  diphenhydramine-acetaminophen (TYLENOL PM) 25-500 MG TABS tablet Take 1-2 tablets by mouth 2 (two) times daily as needed (pain).  glipiZIDE (GLUCOTROL) 5 MG tablet Take 1 tablet (5 mg total) by mouth 2 (two) times daily before a meal.  glucose blood (ONE TOUCH ULTRA TEST) test strip Check blood sugar 3 times a day as instructed  Insulin Pen Needle 31G X 5 MM MISC Use to inject liraglutide once daily as instructed Dx E11.9  LANCETS ULTRA THIN 30G MISC Use to check your blood sugar 3 times a day as instructed.  liraglutide 18 MG/3ML SOPN Inject 0.2 mLs (1.2 mg total) into the skin daily.  lisinopril (PRINIVIL,ZESTRIL) 20 MG tablet TAKE ONE TABLET BY MOUTH ONCE DAILY   Past Medical History:  Diagnosis Date  . Club foot of both lower extremities 1958  . Coronary artery disease    Holter monitor 02/2012 - sinus with rare PVC  . CTEV (congenital talipes equinovarus)   . Gout   . Hyperlipidemia   . Hypertension    . Myocardial infarction ~ 2007   "mild"  . Osteomyelitis ([place])   . Pneumonia 2000's   "couple times"  . Type II diabetes mellitus ([place]) 2011   Social History   Social History  . Marital status: Legally Separated    Spouse name: N/A  . Number of children: N/A  . Years of education: 62   Occupational History  .  Unemployed  . unemployed    Social History Main Topics  . Smoking status: Former Smoker    Packs/day: 1.00    Years: 32.00    Types: Cigarettes    Quit date: 07/06/2007  . Smokeless tobacco: Never Used  . Alcohol use 0.0 oz/week     Comment: Sometimes.  . Drug use: No  . Sexual activity: Not on file   Other Topics Concern  . Not on file   Social History Narrative  . No narrative on file   Family History  Problem Relation Age of Onset  . Diabetes Mother   . Coronary artery disease Mother     HAS PACEMAKER  . Heart disease Mother   . Heart attack Brother     O: Component Value Date/Time   CHOL 220 (H) 03/22/2016 1613   HDL 34 (L) 03/22/2016 1613   TRIG 191 (H) 03/22/2016 1613   AST 24 06/19/2015 1644   ALT 23 06/19/2015 1644   NA 133 (L) 01/12/2016 1605  NA 140 06/19/2015 1644   K 4.1 01/12/2016 1605   CL 100 (L) 01/12/2016 1605   CO2 24 01/12/2016 1605   GLUCOSE 470 (H) 01/12/2016 1605   HGBA1C 10.4 (H) 03/22/2016 1613   BUN 17 01/12/2016 1605   BUN 11 06/19/2015 1644   CREATININE 1.24 01/12/2016 1605   CREATININE 1.22 06/30/2014 1634   CALCIUM 9.7 01/12/2016 1605   GFRAA >60 01/12/2016 1605   GFRAA >89 10/23/2013 1554   WBC 7.9 01/12/2016 1605   WBC 9.5 09/28/2015 1017   HGB 12.6 (L) 09/28/2015 1017   HCT 41.4 01/12/2016 1605   PLT 256 01/12/2016 1605   TSH 0.819 02/20/2012 1013   Ht Readings from Last 2 Encounters:  03/22/16 5' 9.6" (1.768 m)  03/03/16 '5\' 8"'  (1.727 m)   Wt Readings from Last 2 Encounters:  03/22/16 234 lb 4.8 oz (106.3 kg)  03/22/16 234 lb 4.8 oz (106.3 kg)   There is no height or weight on file to calculate  BMI. BP Readings from Last 3 Encounters:  03/22/16 140/80  03/03/16 140/86  02/10/16 (!) 146/79   A/P:  A1C 10.4 on 03/22/16, patient unable to afford liraglutide Medication Samples have been provided to the patient. The patient has been instructed regarding the correct time, dose, and frequency of taking this medication, including desired effects and most common side effects.   Drug: liraglutide Strength: 18 mcg Qty: 2 LOT: [id number] Exp.Date: Jan 19  Dosing instructions: 1.2 mg daily   Other prescriptions transferred to [facility] pharmacy to help with liraglutide affordability.  Patient also working with dietician  An after visit summary was provided and patient advised to follow up if any changes in condition or questions regarding medications arise.   The patient verbalized understanding of information provided by repeating back concepts discussed.

## 2016-07-14 ENCOUNTER — Ambulatory Visit (INDEPENDENT_AMBULATORY_CARE_PROVIDER_SITE_OTHER): Payer: Medicare Other | Admitting: Pharmacist

## 2016-07-14 ENCOUNTER — Encounter: Payer: Self-pay | Admitting: Pharmacist

## 2016-07-14 DIAGNOSIS — E119 Type 2 diabetes mellitus without complications: Secondary | ICD-10-CM | POA: Diagnosis not present

## 2016-07-14 DIAGNOSIS — M10042 Idiopathic gout, left hand: Secondary | ICD-10-CM

## 2016-07-14 DIAGNOSIS — I10 Essential (primary) hypertension: Secondary | ICD-10-CM

## 2016-07-14 LAB — POCT CBG (FASTING - GLUCOSE)-MANUAL ENTRY: GLUCOSE FASTING, POC: 89 mg/dL (ref 70–99)

## 2016-07-14 MED ORDER — GLIPIZIDE 5 MG PO TABS: 5.0000 mg | ORAL_TABLET | Freq: Two times a day (BID) | ORAL | 3 refills | Status: DC

## 2016-07-14 MED ORDER — ALLOPURINOL 100 MG PO TABS: 100.0000 mg | ORAL_TABLET | Freq: Every day | ORAL | 2 refills | Status: DC

## 2016-07-14 MED ORDER — AMLODIPINE BESYLATE 5 MG PO TABS: 5.0000 mg | ORAL_TABLET | Freq: Every day | ORAL | 1 refills | Status: DC

## 2016-07-14 MED ORDER — CARVEDILOL 6.25 MG PO TABS: 6.2500 mg | ORAL_TABLET | Freq: Two times a day (BID) | ORAL | 2 refills | Status: DC

## 2016-07-14 MED ORDER — LISINOPRIL 20 MG PO TABS: 20.0000 mg | ORAL_TABLET | Freq: Every day | ORAL | 3 refills | Status: DC

## 2016-07-14 MED ORDER — LIRAGLUTIDE 18 MG/3ML ~~LOC~~ SOPN: 1.2000 mg | PEN_INJECTOR | Freq: Every day | SUBCUTANEOUS | 2 refills | Status: DC

## 2016-07-14 NOTE — Progress Notes (Signed)
  I reviewed Paige Cawley's assessment and plan and agree.

## 2016-07-14 NOTE — Progress Notes (Signed)
S: Daniel Perkins is a 61 y.o. male reports to clinical pharmacist appointment for medication management.   Allergies  Allergen Reactions  . Morphine Itching  . Pravastatin Sodium Other (See Comments)    Severe muscle cramps with one time requiring admission.    Medication Sig Authorizing Provider  acetaminophen (TYLENOL) 500 MG tablet Take 1,000 mg by mouth every 6 (six) hours as needed for moderate pain (gout). Historical Provider, MD  allopurinol (ZYLOPRIM) 100 MG tablet Take 1 tablet (100 mg total) by mouth daily. Shela Leff, MD  amLODipine (NORVASC) 5 MG tablet Take 1 tablet (5 mg total) by mouth daily. Shela Leff, MD  aspirin EC 81 MG tablet Take 1 tablet (81 mg total) by mouth daily. Burgess Estelle, MD  Blood Glucose Monitoring Suppl (ONE TOUCH ULTRA 2) W/DEVICE KIT Check blood sugar twice daily as instructed dx code 250.00 Jessee Avers, MD  carvedilol (COREG) 6.25 MG tablet Take 1 tablet (6.25 mg total) by mouth 2 (two) times daily. Shela Leff, MD  colchicine 0.6 MG tablet Take 1 tablet (0.6 mg total) by mouth daily as needed (gout). For gout flare Dorie Rank, MD  diphenhydramine-acetaminophen (TYLENOL PM) 25-500 MG TABS tablet Take 1-2 tablets by mouth 2 (two) times daily as needed (pain). Historical Provider, MD  glipiZIDE (GLUCOTROL) 5 MG tablet Take 1 tablet (5 mg total) by mouth 2 (two) times daily before a meal. Shela Leff, MD  glucose blood (ONE TOUCH ULTRA TEST) test strip Check blood sugar 3 times a day as instructed Shela Leff, MD  Insulin Pen Needle 31G X 5 MM MISC Use to inject liraglutide once daily as instructed Dx E11.9 Shela Leff, MD  LANCETS ULTRA THIN 30G MISC Use to check your blood sugar 3 times a day as instructed. Shela Leff, MD  liraglutide 18 MG/3ML SOPN Inject 0.2 mLs (1.2 mg total) into the skin daily. Shela Leff, MD  lisinopril (PRINIVIL,ZESTRIL) 20 MG tablet Take 1 tablet (20 mg total) by mouth  daily. Shela Leff, MD    Past Medical History:  Diagnosis Date  . Club foot of both lower extremities 1958  . Coronary artery disease    Holter monitor 02/2012 - sinus with rare PVC  . CTEV (congenital talipes equinovarus)   . Gout   . Hyperlipidemia   . Hypertension   . Myocardial infarction ~ 2007   "mild"  . Osteomyelitis ([place])   . Pneumonia 2000's   "couple times"  . Type II diabetes mellitus ([place]) 2011   Social History   Social History  . Marital status: Legally Separated    Spouse name: N/A  . Number of children: N/A  . Years of education: 102   Occupational History  .  Unemployed  . unemployed    Social History Main Topics  . Smoking status: Former Smoker    Packs/day: 1.00    Years: 32.00    Types: Cigarettes    Quit date: 07/06/2007  . Smokeless tobacco: Never Used  . Alcohol use 0.0 oz/week     Comment: Sometimes.  . Drug use: No  . Sexual activity: Not Asked   Other Topics Concern  . None   Social History Narrative  . None   Family History  Problem Relation Age of Onset  . Diabetes Mother   . Coronary artery disease Mother     HAS PACEMAKER  . Heart disease Mother   . Heart attack Brother     O:    Component Value Date/Time  CHOL 220 (H) 03/22/2016 1613   HDL 34 (L) 03/22/2016 1613   TRIG 191 (H) 03/22/2016 1613   AST 24 06/19/2015 1644   ALT 23 06/19/2015 1644   NA 133 (L) 01/12/2016 1605   NA 140 06/19/2015 1644   K 4.1 01/12/2016 1605   CL 100 (L) 01/12/2016 1605   CO2 24 01/12/2016 1605   GLUCOSE 87  01/12/2016 1605   HGBA1C 10.4 (H) 03/22/2016 1613   BUN 17 07/14/2016 1121   BUN 11 06/19/2015 1644   CREATININE 1.24 01/12/2016 1605   CREATININE 1.22 06/30/2014 1634   CALCIUM 9.7 01/12/2016 1605   GFRAA >60 01/12/2016 1605   GFRAA >89 10/23/2013 1554   WBC 7.9 01/12/2016 1605   WBC 9.5 09/28/2015 1017   HGB 12.6 (L) 09/28/2015 1017   HCT 41.4 01/12/2016 1605   PLT 256 01/12/2016 1605   TSH 0.819 02/20/2012 1013    Ht Readings from Last 2 Encounters:  03/22/16 5' 9.6" (1.768 m)  03/03/16 '5\' 8"'  (1.727 m)   Wt Readings from Last 2 Encounters:  03/22/16 234 lb 4.8 oz (106.3 kg)  03/22/16 234 lb 4.8 oz (106.3 kg)   There is no height or weight on file to calculate BMI. BP Readings from Last 3 Encounters:  07/14/16 129/81  03/22/16 140/80  03/03/16 140/86     A/P: The goal of the visit was to assess blood pressure as the patient was borderline goal <140/80. Upon first reading, BP was in goal at 129/81. Patient requested glucose reading because he forgot this morning and it was in goal at 87.  Medication Samples have been provided to the patient.  Drug: Victoza Qty: 1 pen Dose: 77m/mL LOT: [id number].Date: Jan 2019 Dosing instructions: Inject SQ daily.  The patient asked about his stomach "blowing up" with this medication. He reports weight fluctuations, but the woman accompanying him said he "looked like that before the Victoza."   Samples signed for by EVerta Ellen PharmD  PFlorinda Marker11:20 AM 07/14/2016   Findings/Recommendations:  Continue current medications  Ensure that strips work properly with meter and if they are not expired, call [facility] for more strips.       10 minutes spent face-to-face with the patient during the encounter. 50% of time spent on education. 50% of time was spent on vital sign and glucose monitoring.

## 2016-08-04 ENCOUNTER — Telehealth: Payer: Self-pay

## 2016-08-04 NOTE — Telephone Encounter (Signed)
Nic from [facility] needs to speak with a nurse about meds. Please call pt back.

## 2016-08-04 NOTE — Telephone Encounter (Signed)
Verified victoza script

## 2016-10-24 ENCOUNTER — Telehealth: Payer: Self-pay | Admitting: Internal Medicine

## 2016-10-24 NOTE — Telephone Encounter (Signed)
Patient would like to know if we have any more samples of a medication he can not pay for.  Please call patient .

## 2016-10-25 ENCOUNTER — Telehealth: Payer: Self-pay | Admitting: Pharmacist

## 2016-10-25 NOTE — Telephone Encounter (Signed)
Pt calls 6/19 and request appt for shortness of breath with exertion, denies chest pain, n&v does state he gets dizzy and weak and must sit down immediately. Also he states he needs refills on meds. States he has trouble coming to appts. [facility]

## 2016-10-25 NOTE — Telephone Encounter (Signed)
Thanks

## 2016-10-25 NOTE — Progress Notes (Signed)
Patient called asking for help affording Victoza. Referred patient to Medicare Extra Help program and patient assistance program. Other low-cost option can consider for patient includes pioglitazone. Also advised patient to work with dietician, Norm Parcelonna Plyler, and recommended PCP appointment. Patient verbalized understanding.

## 2016-10-25 NOTE — Telephone Encounter (Signed)
Thanks Dr. Selena BattenKim. I really appreciate your help.

## 2016-10-26 ENCOUNTER — Ambulatory Visit (INDEPENDENT_AMBULATORY_CARE_PROVIDER_SITE_OTHER): Payer: Medicare Other | Admitting: Internal Medicine

## 2016-10-26 VITALS — BP 134/85 | HR 72 | Temp 97.8°F | Ht 68.0 in | Wt 238.4 lb

## 2016-10-26 DIAGNOSIS — Z87891 Personal history of nicotine dependence: Secondary | ICD-10-CM

## 2016-10-26 DIAGNOSIS — I25118 Atherosclerotic heart disease of native coronary artery with other forms of angina pectoris: Secondary | ICD-10-CM

## 2016-10-26 DIAGNOSIS — I2510 Atherosclerotic heart disease of native coronary artery without angina pectoris: Secondary | ICD-10-CM

## 2016-10-26 DIAGNOSIS — Z79899 Other long term (current) drug therapy: Secondary | ICD-10-CM

## 2016-10-26 DIAGNOSIS — M10042 Idiopathic gout, left hand: Secondary | ICD-10-CM | POA: Diagnosis not present

## 2016-10-26 DIAGNOSIS — I10 Essential (primary) hypertension: Secondary | ICD-10-CM | POA: Diagnosis not present

## 2016-10-26 DIAGNOSIS — M109 Gout, unspecified: Secondary | ICD-10-CM | POA: Diagnosis not present

## 2016-10-26 DIAGNOSIS — Z7982 Long term (current) use of aspirin: Secondary | ICD-10-CM | POA: Diagnosis not present

## 2016-10-26 MED ORDER — NITROGLYCERIN 0.4 MG SL SUBL: 0.4000 mg | SUBLINGUAL_TABLET | SUBLINGUAL | 2 refills | Status: DC | PRN

## 2016-10-26 MED ORDER — CARVEDILOL 12.5 MG PO TABS: 12.5000 mg | ORAL_TABLET | Freq: Two times a day (BID) | ORAL | 1 refills | Status: DC

## 2016-10-26 MED ORDER — FLUVASTATIN SODIUM 20 MG PO CAPS: 20.0000 mg | ORAL_CAPSULE | Freq: Every evening | ORAL | 1 refills | Status: DC

## 2016-10-26 MED ORDER — COLCHICINE 0.6 MG PO TABS: 0.6000 mg | ORAL_TABLET | Freq: Every day | ORAL | 1 refills | Status: DC | PRN

## 2016-10-26 MED ORDER — ALLOPURINOL 100 MG PO TABS: 100.0000 mg | ORAL_TABLET | Freq: Every day | ORAL | 1 refills | Status: DC

## 2016-10-26 NOTE — Assessment & Plan Note (Signed)
Assessment Initial blood pressure 158/90 and repeat 134/85 at this visit. Current medication regimen includes amlodipine 5 mg daily, lisinopril 20 mg daily, and Coreg 6.25 mg twice daily.  Plan -Dose of Coreg has been increased to 12.5 mg twice daily to help with the patient's angina -Continue lisinopril and amlodipine as above

## 2016-10-26 NOTE — Progress Notes (Signed)
   CC: Patient is complaining of shortness of breath with walking. Gout and hypertension were also discussed during this visit.  HPI:  Daniel Perkins is a 61 y.o. male with a past medical history of conditions listed below presenting to the clinic complaining of shortness of breath with walking. Gout and hypertension were also discussed during this visit. Please see problem based charting for the status of the patient's current and chronic medical conditions.   Past Medical History:  Diagnosis Date  . Club foot of both lower extremities 1958  . Coronary artery disease    Holter monitor 02/2012 - sinus with rare PVC  . CTEV (congenital talipes equinovarus)   . Gout   . Hyperlipidemia   . Hypertension   . Myocardial infarction ~ 2007   "mild"  . Osteomyelitis ([facility])   . Pneumonia 2000's   "couple times"  . Type II diabetes mellitus ([facility]) 2011    Review of Systems: Pertinent positives mentioned in HPI. Remainder of all ROS negative.   Physical Exam:  Vitals:   10/26/16 0835 10/26/16 0925  BP: (!) 158/90 134/85  Pulse: 81 72  Temp: 97.8 F (36.6 C)   TempSrc: Oral   SpO2: 100%   Weight: 238 lb 6.4 oz (108.1 kg)   Height: 5\' 8"  (1.727 m)    Physical Exam  Constitutional: He is oriented to person, place, and time. He appears well-developed and well-nourished. No distress.  HENT:  Head: Normocephalic and atraumatic.  Eyes: Right eye exhibits no discharge. Left eye exhibits no discharge.  Cardiovascular: Normal rate, regular rhythm and intact distal pulses.   Pulmonary/Chest: Effort normal and breath sounds normal. No respiratory distress. He has no wheezes. He has no rales.  Abdominal: Soft. Bowel sounds are normal. He exhibits no distension. There is no tenderness.  Musculoskeletal: He exhibits no edema.  Neurological: He is alert and oriented to person, place, and time.  Skin: Skin is warm and dry.    Assessment & Plan:   See Encounters Tab for problem based  charting.  Patient discussed with Dr. Oswaldo DoneVincent

## 2016-10-26 NOTE — Assessment & Plan Note (Addendum)
Assessment Patient is requesting refills on allopurinol and colchicine. States his last gout flare was several months ago.  Plan -Continue Allopurinol -Continue colchicine for flares -Check uric acid level  Addendum 10/27/2016: Uric acid level 6.9. Goal uric acid is less than 6. Patient is currently on allopurinol 100 mg daily. -Increase dose of allopurinol to 150 mg daily -Tried calling the patient, male family member picked up the phone. Told me patient does not have an alternate phone number. Requested family member to ask the patient to call the clinic.   Addendum 10/27/2016: Spoke to his pharmacy ([facility] [address]) and they informed me patient has not picked up allopurinol since 04/16/2016. At that time, they had dispensed a 30 day supply of the medication. It seems patient's elevated uric acid level is likely secondary to medication noncompliance. I asked the pharmacy to continue allopurinol 100 mg daily and cancel the new prescription for the 150 mg dose. Will try calling the patient again.  Addendum 10/31/2016: Tried calling the patient again but could not reach him over the phone.  Addendum 11/01/2016: Tried calling the patient again but could not reach him over the phone.

## 2016-10-26 NOTE — Progress Notes (Signed)
Internal Medicine Clinic Attending  Case discussed with Dr. Rathoreat the time of the visit. We reviewed the resident's history and exam and pertinent patient test results. I agree with the assessment, diagnosis, and plan of care documented in the resident's note.  

## 2016-10-26 NOTE — Patient Instructions (Addendum)
Mr. Gale JourneyWestmoreland it was nice seeing you today.   -Please call and make an appointment with cardiology.  -Dose of carvedilol has been increased: Take 12.5 mg twice daily  -Take sublingual nitroglycerin as needed for chest pain  -Start taking fluvastatin 20 mg daily. If you experience any muscle pain/ cramps stop taking this medication and call the clinic immediately.  -Continue taking amlodipine and lisinopril as before

## 2016-10-26 NOTE — Assessment & Plan Note (Signed)
Assessment Patient is complaining of shortness of breath, a sensation of heat rushing across his chest, and lightheadedness with walking for over a year. States each episode lasts about 20-30 seconds and he gets these episodes every other day. States his symptoms resolve after he sits down. Denies having any associated chest pain, nausea, vomiting, or episodes of loss of consciousness. Denies having any associated wheezing. Patient is a former smoker. He quit smoking 8 years ago and was smoking greater than one half pack per day for 10 years previously. He denies having any GERD symptoms.  Assessment Patient's current presentation is likely due to stable angina. Stress test done in July 2016 was low risk and cath done in March 2011 with nonobstructive coronary artery disease. He appears euvolemic on exam and EF was 62% on the stress test. He does not have a history of asthma or COPD. There was concern for possible primary lung disease and patient was ambulated during this visit. His oxygen saturation was 98% at rest and he maintained it at 96% with ambulation.  Plan -Continue aspirin -Increased dose of Coreg to 12.5 mg twice daily for its antianginal properties -Sublingual nitroglycerin as needed -Patient has a history of statin intolerance. Discussed starting him on fluvastatin 20 mg daily because it has a lower risk of myopathy. Advised him to stop taking this medication and call the clinic immediately if he experiences any muscle pain/ cramps. Patient agreed. -Referral to cardiology has been placed

## 2016-10-27 LAB — URIC ACID: Uric Acid: 6.9 mg/dL (ref 3.7–8.6)

## 2016-10-27 MED ORDER — ALLOPURINOL 100 MG PO TABS: 100.0000 mg | ORAL_TABLET | Freq: Every day | ORAL | 0 refills | Status: DC

## 2016-10-27 MED ORDER — ALLOPURINOL 100 MG PO TABS: 150.0000 mg | ORAL_TABLET | Freq: Every day | ORAL | 0 refills | Status: DC

## 2016-10-27 NOTE — Addendum Note (Signed)
Addended by: John GiovanniATHORE, Javeon Macmurray on: 10/27/2016 02:07 PM   Modules accepted: Orders

## 2016-10-27 NOTE — Addendum Note (Signed)
Addended by: John GiovanniATHORE, Raeya Merritts on: 10/27/2016 02:00 PM   Modules accepted: Orders

## 2016-10-31 ENCOUNTER — Telehealth: Payer: Self-pay

## 2016-10-31 NOTE — Telephone Encounter (Signed)
Requesting to lab results. Please call back.

## 2016-11-01 ENCOUNTER — Other Ambulatory Visit: Payer: Self-pay | Admitting: Pharmacist

## 2016-11-01 DIAGNOSIS — E119 Type 2 diabetes mellitus without complications: Secondary | ICD-10-CM

## 2016-11-01 NOTE — Telephone Encounter (Signed)
I tried calling the patient again but could not reach him over the phone. If he calls the clinic again, please connect him to me directly. Thanks.

## 2016-11-02 ENCOUNTER — Encounter: Payer: Self-pay | Admitting: Pharmacist

## 2016-11-02 ENCOUNTER — Ambulatory Visit (INDEPENDENT_AMBULATORY_CARE_PROVIDER_SITE_OTHER): Payer: Medicare Other | Admitting: Pharmacist

## 2016-11-02 DIAGNOSIS — Z87891 Personal history of nicotine dependence: Secondary | ICD-10-CM | POA: Diagnosis not present

## 2016-11-02 DIAGNOSIS — Z7982 Long term (current) use of aspirin: Secondary | ICD-10-CM | POA: Diagnosis not present

## 2016-11-02 DIAGNOSIS — Z7189 Other specified counseling: Secondary | ICD-10-CM

## 2016-11-02 DIAGNOSIS — M10042 Idiopathic gout, left hand: Secondary | ICD-10-CM | POA: Diagnosis not present

## 2016-11-02 DIAGNOSIS — I10 Essential (primary) hypertension: Secondary | ICD-10-CM | POA: Diagnosis not present

## 2016-11-02 DIAGNOSIS — Z8249 Family history of ischemic heart disease and other diseases of the circulatory system: Secondary | ICD-10-CM

## 2016-11-02 DIAGNOSIS — Z888 Allergy status to other drugs, medicaments and biological substances status: Secondary | ICD-10-CM | POA: Diagnosis not present

## 2016-11-02 DIAGNOSIS — I25118 Atherosclerotic heart disease of native coronary artery with other forms of angina pectoris: Secondary | ICD-10-CM | POA: Diagnosis not present

## 2016-11-02 DIAGNOSIS — Z794 Long term (current) use of insulin: Secondary | ICD-10-CM

## 2016-11-02 DIAGNOSIS — Z79899 Other long term (current) drug therapy: Secondary | ICD-10-CM | POA: Diagnosis not present

## 2016-11-02 DIAGNOSIS — Z833 Family history of diabetes mellitus: Secondary | ICD-10-CM | POA: Diagnosis not present

## 2016-11-02 DIAGNOSIS — Z885 Allergy status to narcotic agent status: Secondary | ICD-10-CM | POA: Diagnosis not present

## 2016-11-02 MED ORDER — FLUVASTATIN SODIUM 20 MG PO CAPS: 20.0000 mg | ORAL_CAPSULE | Freq: Every evening | ORAL | 1 refills | Status: DC

## 2016-11-02 MED ORDER — ALLOPURINOL 100 MG PO TABS: 100.0000 mg | ORAL_TABLET | Freq: Every day | ORAL | 0 refills | Status: DC

## 2016-11-02 MED ORDER — NITROGLYCERIN 0.4 MG SL SUBL: 0.4000 mg | SUBLINGUAL_TABLET | SUBLINGUAL | 2 refills | Status: DC | PRN

## 2016-11-02 MED ORDER — LIRAGLUTIDE 18 MG/3ML ~~LOC~~ SOPN: 1.2000 mg | PEN_INJECTOR | Freq: Every day | SUBCUTANEOUS | 2 refills | Status: DC

## 2016-11-02 MED ORDER — CARVEDILOL 12.5 MG PO TABS: 12.5000 mg | ORAL_TABLET | Freq: Two times a day (BID) | ORAL | 1 refills | Status: DC

## 2016-11-02 NOTE — Progress Notes (Signed)
S: Daniel Perkins is a 61 y.o. male reports to clinical pharmacist appointment for medication review. Patient did  bring medication bottles.   Allergies  Allergen Reactions  . Morphine Itching  . Pravastatin Sodium Other (See Comments)    Severe muscle cramps with one time requiring admission.    Medication Sig  acetaminophen (TYLENOL) 500 MG tablet Take 1,000 mg by mouth every 6 (six) hours as needed for moderate pain (gout).  allopurinol (ZYLOPRIM) 100 MG tablet Take 1 tablet (100 mg total) by mouth daily.  amLODipine (NORVASC) 5 MG tablet Take 1 tablet (5 mg total) by mouth daily.  aspirin EC 81 MG tablet Take 1 tablet (81 mg total) by mouth daily.  Blood Glucose Monitoring Suppl (ONE TOUCH ULTRA 2) W/DEVICE KIT Check blood sugar twice daily as instructed dx code 250.00  carvedilol (COREG) 12.5 MG tablet Take 1 tablet (12.5 mg total) by mouth 2 (two) times daily.  colchicine 0.6 MG tablet Take 1 tablet (0.6 mg total) by mouth daily as needed (gout). For gout flare  fluvastatin (LESCOL) 20 MG capsule Take 1 capsule (20 mg total) by mouth every evening.  glipiZIDE (GLUCOTROL) 5 MG tablet Take 1 tablet (5 mg total) by mouth 2 (two) times daily before a meal.  glucose blood (ONE TOUCH ULTRA TEST) test strip Check blood sugar 3 times a day as instructed  Insulin Pen Needle 31G X 5 MM MISC Use to inject liraglutide once daily as instructed Dx E11.9  LANCETS ULTRA THIN 30G MISC Use to check your blood sugar 3 times a day as instructed.  liraglutide 18 MG/3ML SOPN Inject 0.2 mLs (1.2 mg total) into the skin daily.  lisinopril (PRINIVIL,ZESTRIL) 20 MG tablet Take 1 tablet (20 mg total) by mouth daily.  nitroGLYCERIN (NITROSTAT) 0.4 MG SL tablet Place 1 tablet (0.4 mg total) under the tongue every 5 (five) minutes as needed for chest pain. Do not take more than 3 tablets in 15 minutes.   Past Medical History:  Diagnosis Date  . Club foot of both lower extremities 1958  . Coronary artery  disease    Holter monitor 02/2012 - sinus with rare PVC  . CTEV (congenital talipes equinovarus)   . Gout   . Hyperlipidemia   . Hypertension   . Myocardial infarction ~ 2007   "mild"  . Osteomyelitis ([place])   . Pneumonia 2000's   "couple times"  . Type II diabetes mellitus ([place]) 2011   Social History   Social History  . Marital status: Legally Separated    Spouse name: N/A  . Number of children: N/A  . Years of education: 65   Occupational History  .  Unemployed  . unemployed    Social History Main Topics  . Smoking status: Former Smoker    Packs/day: 1.00    Years: 32.00    Types: Cigarettes    Quit date: 07/06/2007  . Smokeless tobacco: Never Used  . Alcohol use 0.0 oz/week     Comment: Sometimes.  . Drug use: No  . Sexual activity: Not on file   Other Topics Concern  . Not on file   Social History Narrative  . No narrative on file   Family History  Problem Relation Age of Onset  . Diabetes Mother   . Coronary artery disease Mother        HAS PACEMAKER  . Heart disease Mother   . Heart attack Brother    O: Component Value Date/Time   CHOL 220 (  H) 03/22/2016 1613   HDL 34 (L) 03/22/2016 1613   TRIG 191 (H) 03/22/2016 1613   AST 24 06/19/2015 1644   ALT 23 06/19/2015 1644   NA 133 (L) 01/12/2016 1605   NA 140 06/19/2015 1644   K 4.1 01/12/2016 1605   CL 100 (L) 01/12/2016 1605   CO2 24 01/12/2016 1605   GLUCOSE 470 (H) 01/12/2016 1605   HGBA1C 10.4 (H) 03/22/2016 1613   BUN 17 01/12/2016 1605   BUN 11 06/19/2015 1644   CREATININE 1.24 01/12/2016 1605   CREATININE 1.22 06/30/2014 1634   CALCIUM 9.7 01/12/2016 1605   GFRAA >60 01/12/2016 1605   GFRAA >89 10/23/2013 1554   WBC 7.9 01/12/2016 1605   WBC 9.5 09/28/2015 1017   HGB 12.9 01/12/2016 1605   HCT 41.4 01/12/2016 1605   PLT 256 01/12/2016 1605   TSH 0.819 02/20/2012 1013   Ht Readings from Last 2 Encounters:  10/26/16 _0  (1.727 m)  03/22/16 5' 9.6" (1.768 m)   Wt Readings from  Last 2 Encounters:  10/26/16 238 lb 6.4 oz (108.1 kg)  03/22/16 234 lb 4.8 oz (106.3 kg)   There is no height or weight on file to calculate BMI. BP Readings from Last 3 Encounters:  10/26/16 134/85  07/14/16 129/81  03/22/16 140/80     A/P:  BP at goal, provided education on BG goals and self management, advised patient to follow up with Butch Penny Plyler  Patient states he does not check BG at home consistently, provided a log book and advised him to record home BG to bring to PCP visit next week  Victoza sample provided today, patient unable to afford. Referred to Medicare Extra Help program.  Patient requested transfers to [facility] pharmacy, prescriptions sent for allopurinol, fluvastatin, and nitroglycerin.  Patient reports no signs or symptoms of concern today.  An after visit summary was provided and patient advised to follow up if any changes in condition or questions regarding medications arise. The patient verbalized understanding of information provided by repeating back concepts discussed.

## 2016-11-02 NOTE — Patient Instructions (Signed)
Patient educated about medication as defined in this encounter and verbalized understanding by repeating back instructions provided.   

## 2016-11-07 NOTE — Progress Notes (Signed)
Patient requested appointment for medication review, appointment scheduled

## 2016-11-08 ENCOUNTER — Ambulatory Visit: Payer: Medicare Other | Admitting: Pharmacist

## 2016-11-08 ENCOUNTER — Ambulatory Visit (INDEPENDENT_AMBULATORY_CARE_PROVIDER_SITE_OTHER): Payer: Medicare Other | Admitting: Internal Medicine

## 2016-11-08 ENCOUNTER — Encounter: Payer: Self-pay | Admitting: Internal Medicine

## 2016-11-08 VITALS — BP 136/75 | HR 86 | Temp 98.0°F | Ht 68.0 in | Wt 236.5 lb

## 2016-11-08 DIAGNOSIS — Z719 Counseling, unspecified: Secondary | ICD-10-CM

## 2016-11-08 DIAGNOSIS — E119 Type 2 diabetes mellitus without complications: Secondary | ICD-10-CM

## 2016-11-08 DIAGNOSIS — I10 Essential (primary) hypertension: Secondary | ICD-10-CM | POA: Diagnosis not present

## 2016-11-08 DIAGNOSIS — Z79899 Other long term (current) drug therapy: Secondary | ICD-10-CM | POA: Diagnosis not present

## 2016-11-08 DIAGNOSIS — Z7984 Long term (current) use of oral hypoglycemic drugs: Secondary | ICD-10-CM

## 2016-11-08 DIAGNOSIS — Z87891 Personal history of nicotine dependence: Secondary | ICD-10-CM

## 2016-11-08 LAB — GLUCOSE, CAPILLARY: Glucose-Capillary: 162 mg/dL — ABNORMAL HIGH (ref 65–99)

## 2016-11-08 LAB — POCT GLYCOSYLATED HEMOGLOBIN (HGB A1C): HEMOGLOBIN A1C: 9.4

## 2016-11-08 MED ORDER — LIRAGLUTIDE 18 MG/3ML ~~LOC~~ SOPN: 1.8000 mg | PEN_INJECTOR | Freq: Every day | SUBCUTANEOUS | 1 refills | Status: DC

## 2016-11-08 MED ORDER — GLIPIZIDE 5 MG PO TABS: 5.0000 mg | ORAL_TABLET | Freq: Two times a day (BID) | ORAL | 1 refills | Status: DC

## 2016-11-08 NOTE — Patient Instructions (Signed)
Dose has been Victoza has been increased: Use 1.8 mg daily   Continue using Glipizide as before

## 2016-11-08 NOTE — Progress Notes (Signed)
Liraglutide (Victoza) was reviewed with the patient, including name, instructions, indication, goals of therapy, potential side effects, importance of adherence, and safe use. Patient was approved for Medicare Extra help so will be able to afford and initiate therapy.  Patient verbalized understanding by repeating back information and was advised to contact me if further medication-related questions arise. Patient was also provided an information handout.

## 2016-11-09 NOTE — Assessment & Plan Note (Signed)
Lab Results  Component Value Date   HGBA1C 9.4 11/08/2016   HGBA1C 10.4 (H) 03/22/2016   HGBA1C 11.9 01/12/2016     Assessment: Diabetes control:  not optimally controlled at this time Comments: Currently on glipizide 5 mg twice daily and Victoza 1.2 mg daily. A1c improved at this visit but not at goal. Patient denies having any hypoglycemic symptoms.  Plan: Medications:  Increase dose of Victoza to 1.8 mg daily. Continue glipizide as above. Instruction/counseling given: reminded to get eye exam, reminded to bring medications to each visit, discussed foot care, discussed the need for weight loss, discussed diet and discussed sick day management Educational resources provided: brochure (denies need ) Other plans: Repeat A1c in 3 months.

## 2016-11-09 NOTE — Progress Notes (Signed)
   CC: Patient is here to discuss hypertension and diabetes.  HPI:  [name] is a 61 y.o. male with a past medical history of conditions listed below presenting to the clinic to discuss hypertension and diabetes. Please see problem based charting for the status of the patient's current and chronic medical conditions.   Past Medical History:  Diagnosis Date  . Club foot of both lower extremities 1958  . Coronary artery disease    Holter monitor 02/2012 - sinus with rare PVC  . CTEV (congenital talipes equinovarus)   . Gout   . Hyperlipidemia   . Hypertension   . Myocardial infarction [facility]) ~ 2007   "mild"  . Osteomyelitis ([facility])   . Pneumonia 2000's   "couple times"  . Type II diabetes mellitus ([facility]) 2011   Review of Systems: Pertinent positives mentioned in HPI. Remainder of all ROS negative.   Physical Exam:  Vitals:   11/08/16 1448  BP: 136/75  Pulse: 86  Temp: 98 F (36.7 C)  TempSrc: Oral  SpO2: 98%  Weight: 236 lb 8 oz (107.3 kg)  Height: 5\' 8"  (1.727 m)   Physical Exam  Constitutional: He is oriented to person, place, and time. He appears well-developed and well-nourished. No distress.  HENT:  Head: Normocephalic and atraumatic.  Eyes: Right eye exhibits no discharge. Left eye exhibits no discharge.  Cardiovascular: Normal rate, regular rhythm and intact distal pulses.   Pulmonary/Chest: Effort normal and breath sounds normal. No respiratory distress. He has no wheezes. He has no rales.  Abdominal: Soft. Bowel sounds are normal. He exhibits no distension. There is no tenderness.  Musculoskeletal: He exhibits no edema.  Neurological: He is alert and oriented to person, place, and time.  Skin: Skin is warm and dry.    Assessment & Plan:   See Encounters Tab for problem based charting.  Patient discussed with Dr. Oswaldo DoneVincent

## 2016-11-09 NOTE — Assessment & Plan Note (Signed)
BP Readings from Last 3 Encounters:  11/08/16 136/75  10/26/16 134/85  07/14/16 129/81    Lab Results  Component Value Date   NA 133 (L) 01/12/2016   K 4.1 01/12/2016   CREATININE 1.24 01/12/2016    Assessment: Blood pressure control:  well-controlled Comments: Current medication regimen includes amlodipine 5 mg daily, lisinopril 20 mg daily, and carvedilol 6.25 mg twice daily.  Plan: Medications:  continue current medications. Educated patient about healthy eating and exercise. Emphasized the importance of weight loss.  Educational resources provided: brochure (denies need ).

## 2016-11-10 ENCOUNTER — Encounter: Payer: Self-pay | Admitting: *Deleted

## 2016-11-10 NOTE — Progress Notes (Signed)
Internal Medicine Clinic Attending  Case discussed with Dr. Rathoreat the time of the visit. We reviewed the resident's history and exam and pertinent patient test results. I agree with the assessment, diagnosis, and plan of care documented in the resident's note.  

## 2016-11-17 ENCOUNTER — Other Ambulatory Visit: Payer: Self-pay | Admitting: *Deleted

## 2016-11-17 DIAGNOSIS — I10 Essential (primary) hypertension: Secondary | ICD-10-CM

## 2016-11-17 DIAGNOSIS — M10042 Idiopathic gout, left hand: Secondary | ICD-10-CM

## 2016-11-18 MED ORDER — COLCHICINE 0.6 MG PO TABS: 0.6000 mg | ORAL_TABLET | Freq: Every day | ORAL | 1 refills | Status: DC | PRN

## 2017-01-03 ENCOUNTER — Other Ambulatory Visit: Payer: Self-pay | Admitting: Internal Medicine

## 2017-01-03 DIAGNOSIS — I10 Essential (primary) hypertension: Secondary | ICD-10-CM

## 2017-01-24 ENCOUNTER — Encounter: Payer: Self-pay | Admitting: Internal Medicine

## 2017-01-31 ENCOUNTER — Encounter: Payer: Self-pay | Admitting: Internal Medicine

## 2017-02-02 ENCOUNTER — Other Ambulatory Visit: Payer: Self-pay | Admitting: Internal Medicine

## 2017-02-02 DIAGNOSIS — I10 Essential (primary) hypertension: Secondary | ICD-10-CM

## 2017-02-14 ENCOUNTER — Encounter: Payer: Self-pay | Admitting: Internal Medicine

## 2017-02-14 ENCOUNTER — Ambulatory Visit (INDEPENDENT_AMBULATORY_CARE_PROVIDER_SITE_OTHER): Payer: Medicare Other | Admitting: Internal Medicine

## 2017-02-14 ENCOUNTER — Encounter (INDEPENDENT_AMBULATORY_CARE_PROVIDER_SITE_OTHER): Payer: Self-pay

## 2017-02-14 VITALS — BP 129/81 | HR 81 | Temp 97.9°F | Ht 68.0 in | Wt 228.1 lb

## 2017-02-14 DIAGNOSIS — E1151 Type 2 diabetes mellitus with diabetic peripheral angiopathy without gangrene: Secondary | ICD-10-CM

## 2017-02-14 DIAGNOSIS — Z0000 Encounter for general adult medical examination without abnormal findings: Secondary | ICD-10-CM

## 2017-02-14 DIAGNOSIS — G4739 Other sleep apnea: Secondary | ICD-10-CM

## 2017-02-14 DIAGNOSIS — E119 Type 2 diabetes mellitus without complications: Secondary | ICD-10-CM

## 2017-02-14 DIAGNOSIS — M7582 Other shoulder lesions, left shoulder: Secondary | ICD-10-CM | POA: Diagnosis not present

## 2017-02-14 DIAGNOSIS — E785 Hyperlipidemia, unspecified: Secondary | ICD-10-CM

## 2017-02-14 DIAGNOSIS — R230 Cyanosis: Secondary | ICD-10-CM | POA: Diagnosis not present

## 2017-02-14 DIAGNOSIS — I739 Peripheral vascular disease, unspecified: Secondary | ICD-10-CM

## 2017-02-14 LAB — POCT GLYCOSYLATED HEMOGLOBIN (HGB A1C): HEMOGLOBIN A1C: 11

## 2017-02-14 LAB — GLUCOSE, CAPILLARY
GLUCOSE-CAPILLARY: 118 mg/dL — AB (ref 65–99)
Glucose-Capillary: 144 mg/dL — ABNORMAL HIGH (ref 65–99)

## 2017-02-14 MED ORDER — DICLOFENAC SODIUM 25 MG PO TBEC: 35.0000 mg | DELAYED_RELEASE_TABLET | Freq: Every day | ORAL | Status: DC

## 2017-02-14 MED ORDER — METFORMIN HCL ER 500 MG PO TB24: 500.0000 mg | ORAL_TABLET | Freq: Every day | ORAL | 1 refills | Status: DC

## 2017-02-14 NOTE — Assessment & Plan Note (Signed)
The patient states that he has been having difficulty sleeping. Per previous documentation he has been diagnosed with OSA per stop-bang score of 7. However, a formal sleep study has not been conducted.   -Referral for sleep study made

## 2017-02-14 NOTE — Progress Notes (Signed)
   CC: Diabetes Follow-up  HPI:  [name] is a 61 y.o. with pmh of htn, osa, diabetes, gout, and peripheral vascular disease who presents for a blood sugar check and to discuss about his diabetes. Please see assessment and plan for additional details.   Past Medical History:  Diagnosis Date  . Club foot of both lower extremities 1958  . Coronary artery disease    Holter monitor 02/2012 - sinus with rare PVC  . CTEV (congenital talipes equinovarus)   . Gout   . Hyperlipidemia   . Hypertension   . Myocardial infarction [facility]) ~ 2007   "mild"  . Osteomyelitis ([facility])   . Pneumonia 2000's   "couple times"  . Type II diabetes mellitus ([facility]) 2011   Review of Systems:  Per hpi  Physical Exam:  There were no vitals filed for this visit. Physical Exam  Constitutional: He appears well-developed and well-nourished. No distress.  Cardiovascular: Normal rate, regular rhythm and normal heart sounds.  Exam reveals decreased pulses (bilaterally).   Pulmonary/Chest: Effort normal and breath sounds normal. No respiratory distress. He has no wheezes.  Abdominal: Soft. Bowel sounds are normal. He exhibits no distension. There is no tenderness.  Musculoskeletal:       Left shoulder: He exhibits decreased range of motion and tenderness. He exhibits no swelling and no effusion.       Arms:      Right foot: There is deformity.       Left foot: There is deformity.  Psychiatric: He has a normal mood and affect. His behavior is normal. Judgment and thought content normal.    Assessment & Plan:   See Encounters Tab for problem based charting.  Patient seen with Dr. Cleda Daub

## 2017-02-14 NOTE — Assessment & Plan Note (Signed)
The patient got his flu shot at [facility]. He mentioned that he will bring documentation to his next visit.

## 2017-02-14 NOTE — Assessment & Plan Note (Signed)
The patient has left shoulder pain over the shoulder and under the armpit for the past 3-4 months. Over shoulder and under armpit When moving in any direction, can hurt as much as 10/10, sharp pain, alleviated when resting, used tylenol, ibuprofen, and allevia which does not alleviate. Non radiating

## 2017-02-14 NOTE — Assessment & Plan Note (Addendum)
The patient's last a1c in July 2018 was 9.4 and his random blood glucose in today's visit is 144. The patient previously had a a1c between 6-7 from 2014-2016, however the patient's a1c has increased between 8-11 in the past two years (2016-2018). The patient's diabetes is being managed with glipizide 5 mg bid and victoza 1.8mg  qd. The patient's renal function per last bmp ordered in September 207 showed cr=1.24 and bun>60.   The patient states that he has been having polyuria and decreased appetite for the past 1.5 weeks. He has been having blurry vision, lightheaded, nausea, abdominal discomfort, excessive sweating for the past few weeks. He has had two episodes of watery diarrhea over the past 2 days. He has been unable to check his blood glucose at home as his glucometer broke. The patient has lost 8lbs since last visit in July 2018 from 236lbs to 228lbs.    The patient states he was out of the victoza for two weeks and could not get a new prescription as the price was too high for him. He got two new pens ov victoza last week and has been using it ever since. He has been compliant with all his other medications.  The patient states that his excessive urination has decreased since he has been back on his victoza.   The patient states that he does not exercise. He states that he eats baked chicken and some fried chicken. He has cut back on sodas but continues to drink at least one soda a day.  At this visit, A1C=11 and random glucose=144. Since the patient's blood glucose has not been well controlled will add metformin xr . It was thought that the patient previously could not tolerate metformin due to gi intolerance and he was changed to metformin xr, however he had never tried the xr formulation and was instead placed on victoza.   -Started metformin ER  once daily -Follow up for diabetes management in 3 weeks -Ordered bmp

## 2017-02-14 NOTE — Assessment & Plan Note (Signed)
Ordered lipid panel as patient has not had one in a year.

## 2017-02-14 NOTE — Assessment & Plan Note (Addendum)
The patient states that he has noticed that the toenails on his bilateral big toes have been turing darker in color. The right right toenail has been black for the past few months but the left toenail he states has progressively been becoming black in color.    Pain with passive and active movement in any direction -diclofenac  po tid

## 2017-02-14 NOTE — Assessment & Plan Note (Signed)
The patient noted discoloration in bilateral toes and on physical exam pulses were hard to detect.   In office ABI was conduced which showed: Left ABI=1.03 Right ABI=PAD  -Referred to [facility] cardiology for follow up

## 2017-02-14 NOTE — Patient Instructions (Addendum)
It was a pleasure to meet you Daniel Perkins. During this visit we spoke about your diabetes management, foot care, shoulder pain, and sleep problems. The following changes have been made.   Diabetes: -Added metformin extended release  once daily  Foot: -please maintain good foot hygiene. Trim your toenails and make sure to wash around your toes.   Shoulder pain: Your shoulder pain is likely due to an inflammation of the tendons around your shoulder.  -Start diclofenac  once daily  Sleep: -please follow up for a sleep study  Follow up: -please follow up in 3-4 weeks for your annual check-up -please bring documentation of your flu vaccine  Please contact us should you have any questions or concerns.  Thank you,  Lorenso Courier, MD Internal Medicine PGY1    Foot Care, Adult Foot care is an important part of your health. Noticing and addressing any potential problems early is the best way to prevent future foot problems. How to care for your feet Foot Hygiene  Wash your feet daily with warm water and mild soap. Do not use hot water. Then, pat your feet and the areas between your toes until they are completely dry. Do not soak your feet as this can dry your skin.  Trim your toenails straight across. Do not dig under them or around the cuticle. File the edges of your nails with an emery board or nail file.  On the skin on your feet and on dry, brittle nails, apply a moisturizing lotion or petroleum jelly that is unscented and does not contain alcohol. Do not apply lotion between your toes. Shoes and Socks   Wear clean socks or stockings every day. Make sure they are not too tight.  Wear shoes that fit properly and have enough cushioning. To break in new shoes, wear them for just a few hours a day. This prevents you from injuring your feet. Always look in your shoes before you put them on to be sure there are no objects inside. Wounds, Scrapes, Corns, and  Calluses  Check your feet daily for blisters, cuts, and redness. If you cannot see the bottom of your feet, use a mirror or ask someone for help.  Do not cut corns or calluses. Do not try to remove them with medicine.  If you find a minor scrape, cut, or break in the skin on your feet, keep it and the skin around it clean and dry. These areas may be cleaned with mild soap and water. Do not clean the area with peroxide, alcohol, or iodine.  If you have a wound, scrape, corn, or callus on your foot, look at it several times a day to make sure it is healing and is not infected. Check for: ? More redness, swelling, or pain. ? More fluid or blood. ? Warmth. ? Pus or a bad smell. General Instructions  Do not cross your legs. That may decrease the blood flow to your feet.  Do not use heating pads or hot water bottles on your feet. They may burn your skin. If you have lost feeling in your feet or legs, you may not know it is happening until it is too late.  Make sure your health care provider does a complete foot exam at least annually or more often if you have foot problems. If you have foot problems, report any cuts, sores, or bruises to your health care provider immediately. Contact a health care provider if:  You have a medical condition that  increases your risk of infection and you have any cuts, sores, or bruises on your feet.  You have an injury that is not healing.  You notice redness on your legs or feet.  You feel burning or tingling in your legs or feet.  You have pain or cramps in your legs or feet.  Your legs or feet are numb.  Your feet always feel cold.  You have pain around a toenail. Get help right away if:  You have a wound, scrape, corn, or callus on your foot and: ? You have more redness, swelling, or pain. ? You have more fluid or blood. ? Your wound, scrape, corn, or callus feels warm to the touch. ? You have pus or a bad smell coming from the wound, scrape,  corn, or callus. ? You have a fever.  You have a red line going up your leg. This information is not intended to replace advice given to you by your health care provider. Make sure you discuss any questions you have with your health care provider. Document Released: 10/02/2015 Document Revised: 12/15/2015 Document Reviewed: 10/02/2015 Elsevier Interactive Patient Education  [employer].

## 2017-02-15 LAB — BMP8+ANION GAP
ANION GAP: 14 mmol/L (ref 10.0–18.0)
BUN/Creatinine Ratio: 14 (ref 10–24)
BUN: 15 mg/dL (ref 8–27)
CALCIUM: 9.8 mg/dL (ref 8.6–10.2)
CO2: 21 mmol/L (ref 20–29)
CREATININE: 1.09 mg/dL (ref 0.76–1.27)
Chloride: 101 mmol/L (ref 96–106)
GFR calc Af Amer: 85 mL/min/{1.73_m2} (ref >59)
GFR, EST NON AFRICAN AMERICAN: 73 mL/min/{1.73_m2} (ref >59)
Glucose: 112 mg/dL — ABNORMAL HIGH (ref 65–99)
Potassium: 5.1 mmol/L (ref 3.5–5.2)
Sodium: 136 mmol/L (ref 134–144)

## 2017-02-15 LAB — LIPID PANEL
CHOL/HDL RATIO: 4.9 ratio (ref 0.0–5.0)
Cholesterol, Total: 180 mg/dL (ref 100–199)
HDL: 37 mg/dL — AB (ref >39)
LDL CALC: 120 mg/dL — AB (ref 0–99)
TRIGLYCERIDES: 114 mg/dL (ref 0–149)
VLDL CHOLESTEROL CAL: 23 mg/dL (ref 5–40)

## 2017-02-16 NOTE — Addendum Note (Signed)
Addended by: Lorenso Courier on: 02/16/2017 09:27 AM   Modules accepted: Orders

## 2017-02-16 NOTE — Addendum Note (Signed)
Addended by: Lorenso Courier on: 02/16/2017 08:42 AM   Modules accepted: Orders

## 2017-03-07 ENCOUNTER — Other Ambulatory Visit: Payer: Self-pay | Admitting: *Deleted

## 2017-03-07 ENCOUNTER — Telehealth: Payer: Self-pay | Admitting: Internal Medicine

## 2017-03-07 NOTE — Telephone Encounter (Signed)
Direct Extension [phone] [facility] requesting a refill Request and a call back if possible in reference to the patient's  Pen needles.  Patient has been out for 1 week.  [facility] [address] [address]  One Touch Verio -patient is needing the Test strips for that as well.  Patient is also concerned about another medicine.  Please call nurse back.

## 2017-03-07 NOTE — Telephone Encounter (Signed)
[facility], called pharm, pt had a script on hold that is good til December, they will fill and notify him, sending request to pcp for refill

## 2017-03-08 ENCOUNTER — Other Ambulatory Visit: Payer: Self-pay | Admitting: *Deleted

## 2017-03-08 MED ORDER — GLUCOSE BLOOD VI STRP: ORAL_STRIP | 6 refills | Status: DC

## 2017-03-08 MED ORDER — INSULIN PEN NEEDLE 31G X 5 MM MISC: 5 refills | Status: DC

## 2017-03-08 MED ORDER — ONETOUCH VERIO VI SOLN: 0 refills | Status: DC

## 2017-03-08 MED ORDER — DICLOFENAC SODIUM 25 MG PO TBEC: 25.0000 mg | DELAYED_RELEASE_TABLET | Freq: Three times a day (TID) | ORAL | 0 refills | Status: DC | PRN

## 2017-03-08 NOTE — Telephone Encounter (Signed)
Spoke w/ nurse this am, request sent

## 2017-03-15 ENCOUNTER — Encounter: Payer: Self-pay | Admitting: Physician Assistant

## 2017-03-15 ENCOUNTER — Ambulatory Visit (INDEPENDENT_AMBULATORY_CARE_PROVIDER_SITE_OTHER): Payer: Medicare Other | Admitting: Physician Assistant

## 2017-03-15 VITALS — BP 154/80 | HR 77 | Ht 68.0 in | Wt 232.8 lb

## 2017-03-15 DIAGNOSIS — I2510 Atherosclerotic heart disease of native coronary artery without angina pectoris: Secondary | ICD-10-CM

## 2017-03-15 DIAGNOSIS — I739 Peripheral vascular disease, unspecified: Secondary | ICD-10-CM | POA: Diagnosis not present

## 2017-03-15 DIAGNOSIS — I10 Essential (primary) hypertension: Secondary | ICD-10-CM

## 2017-03-15 DIAGNOSIS — R0789 Other chest pain: Secondary | ICD-10-CM

## 2017-03-15 DIAGNOSIS — E785 Hyperlipidemia, unspecified: Secondary | ICD-10-CM | POA: Diagnosis not present

## 2017-03-15 NOTE — Progress Notes (Addendum)
Cardiology Office Note:    Date:  03/15/2017   ID:  De Blanch, DOB 1956-01-30, MRN [id number]  PCP:  Shela Leff, MD  Cardiologist:  Dr. Dorris Carnes    Referring MD: Lars Mage, MD   Chief Complaint  Patient presents with  . Coronary Artery Disease    Follow-up   CrCl cannot be calculated (Patient's most recent lab result is older than the maximum 21 days allowed.).   History of Present Illness:    Daniel Perkins is a 61 y.o. male with a hx of minimal coronary plaque by previous cardiac catheterization, diabetes, hypertension, hyperlipidemia.  He was last seen by Dr. Harrington Challenger in July 2016.  Stress test at that time was low risk.  [name] returns for cardiology follow-up.  He notes occasional chest discomfort.  This typically occurs with activity.  It is associated with shortness of breath.  He has noted these symptoms for years.  They may be more frequent recently.  He denies syncope, PND, edema.  He was told recently that he may have peripheral arterial disease.  It appears that he had a screening at his PCPs office that was suggestive of PAD on the right.  He does describe some pain in his right calf with ambulation.  This will occur if he walks long distances.  Prior CV studies:   The following studies were reviewed today:  Nuclear stress test 11/24/14 Diaphragmatic attenuation, no ischemia, EF 62, low risk  Holter 02/29/12 Sinus rhythm, rare PVCs  Echo 08/11/10 Mild LVH, EF 65, no RWMA, +diastolic dysfunction, mild AI  Cardiac catheterization 07/2009 Proximal OM 20-30  Cardiac Catheterization 06/2004 D1 ostial 20 OM mid 50-60 EF normal   Past Medical History:  Diagnosis Date  . Club foot of both lower extremities 1958  . Coronary artery disease    Holter monitor 02/2012 - sinus with rare PVC  . CTEV (congenital talipes equinovarus)   . Gout   . Hyperlipidemia   . Hypertension   . Myocardial infarction [facility]) ~ 2007   "mild"  .  Osteomyelitis ([place])   . Pneumonia 2000's   "couple times"  . Type II diabetes mellitus ([place]) 2011    Past Surgical History:  Procedure Laterality Date  . ANTERIOR CERVICAL DECOMP/DISCECTOMY FUSION  X 2  . CARDIAC CATHETERIZATION    . FOOT FRACTURE SURGERY Right 1990's   "pt a steel plate in"  . FOOT SURGERY Left x16   "joints collapsed; keep getting infected"  . POSTERIOR CERVICAL FUSION/FORAMINOTOMY  X 2    Current Medications: Current Meds  Medication Sig  . acetaminophen (TYLENOL) 500 MG tablet Take 1,000 mg by mouth every 6 (six) hours as needed for moderate pain (gout).  Marland Kitchen allopurinol (ZYLOPRIM) 100 MG tablet Take 1 tablet (100 mg total) by mouth daily.  Marland Kitchen amLODipine (NORVASC) 5 MG tablet TAKE 1 TABLET BY MOUTH  DAILY  . aspirin EC 81 MG tablet Take 1 tablet (81 mg total) by mouth daily.  . Blood Glucose Calibration (ONETOUCH VERIO) SOLN Use to check blood glucose as instructed. E11.9.  . Blood Glucose Monitoring Suppl (ONE TOUCH ULTRA 2) W/DEVICE KIT Check blood sugar twice daily as instructed dx code 250.00  . carvedilol (COREG) 12.5 MG tablet Take 1 tablet (12.5 mg total) by mouth 2 (two) times daily.  . colchicine 0.6 MG tablet Take 1 tablet (0.6 mg total) by mouth daily as needed (gout). For gout flare  . fluvastatin (LESCOL) 20 MG capsule Take 1 capsule (  20 mg total) by mouth every evening.  Marland Kitchen glipiZIDE (GLUCOTROL) 5 MG tablet Take 1 tablet (5 mg total) by mouth 2 (two) times daily before a meal.  . glucose blood (ONETOUCH VERIO) test strip UsE 3 TIMES DAILY TO TEST BLOOD SUGAR. 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 Insulin Pen Needle 31G X 5 MM MISC Use to inject liraglutide once daily as instructed Dx E11.9  . LANCETS ULTRA THIN 30G MISC Use to check your blood sugar 3 times a day as instructed.  . liraglutide (VICTOZA) 18 MG/3ML SOPN Inject 0.3 mLs (1.8 mg total) into the skin daily.  Marland Kitchen lisinopril (PRINIVIL,ZESTRIL) 20 MG tablet Take 1 tablet (20 mg total) by mouth daily.  .  nitroGLYCERIN (NITROSTAT) 0.4 MG SL tablet Place 1 tablet (0.4 mg total) under the tongue every 5 (five) minutes as needed for chest pain. Do not take more than 3 tablets in 15 minutes.   Current Facility-Administered Medications for the 03/15/17 encounter (Office Visit) with Richardson Dopp T, PA-C  Medication  . diclofenac (VOLTAREN) EC tablet 25 mg     Allergies:   Morphine and Pravastatin sodium   Social History   Tobacco Use  . Smoking status: Former Smoker    Packs/day: 1.00    Years: 32.00    Pack years: 32.00    Types: Cigarettes    Last attempt to quit: 07/06/2007    Years since quitting: 9.6  . Smokeless tobacco: Never Used  Substance Use Topics  . Alcohol use: Yes    Alcohol/week: 0.0 oz    Comment: Sometimes.  . Drug use: No     Family Hx: The patient's family history includes Coronary artery disease in his mother; Diabetes in his mother; Heart attack in his brother; Heart disease in his mother.  ROS:   Please see the history of present illness.    Review of Systems  Cardiovascular: Positive for dyspnea on exertion.  Neurological: Positive for loss of balance.   All other systems reviewed and are negative.   EKGs/Labs/Other Test Reviewed:    EKG:  EKG is  ordered today.  The ekg ordered today demonstrates normal sinus rhythm, heart rate 77, left axis deviation, nonspecific ST-T wave changes, QTC 432 ms, no changes  Recent Labs: 02/14/2017: BUN 15; Creatinine, Ser 1.09; Potassium 5.1; Sodium 136   Recent Lipid Panel Lab Results  Component Value Date/Time   CHOL 180 02/14/2017 11:15 AM   TRIG 114 02/14/2017 11:15 AM   HDL 37 (L) 02/14/2017 11:15 AM   CHOLHDL 4.9 02/14/2017 11:15 AM   CHOLHDL 6.1 06/30/2014 04:34 PM   LDLCALC 120 (H) 02/14/2017 11:15 AM    Physical Exam:    VS:  BP (!) 154/80   Pulse 77   Ht 5' 8" (1.727 m)   Wt 232 lb 12.8 oz (105.6 kg)   SpO2 97%   BMI 35.40 kg/m     Wt Readings from Last 3 Encounters:  03/15/17 232 lb 12.8 oz  (105.6 kg)  02/14/17 228 lb 1.6 oz (103.5 kg)  11/08/16 236 lb 8 oz (107.3 kg)     Physical Exam  Constitutional: He is oriented to person, place, and time. He appears well-developed and well-nourished. No distress.  HENT:  Head: Normocephalic and atraumatic.  Neck: No JVD present. Carotid bruit is not present.  Cardiovascular: Normal rate and regular rhythm.  No murmur heard. Pulmonary/Chest: Effort normal. He has no rales.  Abdominal: Soft.  Musculoskeletal: He exhibits no edema.  Neurological: He  is alert and oriented to person, place, and time.  Skin: Skin is warm and dry.    ASSESSMENT:    1. Other chest pain   2. Coronary artery disease involving native coronary artery of native heart without angina pectoris   3. Claudication ([place])   4. Essential hypertension   5. Hyperlipidemia, unspecified hyperlipidemia type    PLAN:    In order of problems listed above:  1.  Other chest pain He notes occasional chest discomfort and shortness of breath with activity.  He has had this for quite some time.  He thinks that it may be getting somewhat worse.  It has been 2 years since his last stress test.  He does have a history of mild coronary plaque by previous cardiac catheterization.  However, he is diabetic and is at high risk for progression of coronary artery disease.  I will arrange a Lexiscan Myoview to rule out ischemia.  2.  Coronary artery disease involving native coronary artery of native heart without angina pectoris Last cardiac catheterization in 2011 demonstrated 20-30% proximal obtuse marginal stenosis.  As noted, he has had some chest discomfort and a repeat stress test will be arranged.  Continue aspirin, beta-blocker, statin.  3.  Claudication [facility])  He had a recent screening test with his PCP that suggested PAD on the right.  He does describe some calf pain with ambulation on the right.  It is difficult to palpate his pulses bilaterally.  I will arrange ABIs and lower  extremity arterial Dopplers.  If significant PAD is noted, he will need referral to PV.  4.  Essential hypertension Blood pressure is elevated today.  In review of his chart, he usually has optimal blood pressures when seen.  Continue current regimen.  Continue follow-up with primary care.  5.  Hyperlipidemia, unspecified hyperlipidemia type Managed by primary care.  Continue statin.    Dispo:  Return in about 1 year (around 03/15/2018) for Routine Follow Up, w/ Dr. Harrington Challenger.   Medication Adjustments/Labs and Tests Ordered: Current medicines are reviewed at length with the patient today.  Concerns regarding medicines are outlined above.  Tests Ordered: Orders Placed This Encounter  Procedures  . Myocardial Perfusion Imaging  . EKG 12-Lead   Medication Changes: No orders of the defined types were placed in this encounter.   Signed, Richardson Dopp, PA-C  03/15/2017 4:08 PM    [facility] [place], [place], [place]  [address] Phone: [phone]; Fax: [phone]

## 2017-03-15 NOTE — Patient Instructions (Addendum)
Your physician recommends that you continue on your current medications as directed. Please refer to the Current Medication list given to you today.  Your physician has requested that you have a lower extremity arterial duplex. This test is an ultrasound of the arteries in the legsIt looks at arterial blood flow in the legs . Allow one hour for Lower and Upper Arterial scans. There are no restrictions or special instructions   WITH ABI   Your physician has requested that you have a lexiscan myoview. For further information please visit [email or web address]. Please follow instruction sheet, as given.  Your physician wants you to follow-up in: YEAR WITH DR Filbert SchilderOSS  You will receive a reminder letter in the mail two months in advance. If you don't receive a letter, please call our office to schedule the follow-up appointment.

## 2017-03-16 ENCOUNTER — Telehealth (HOSPITAL_COMMUNITY): Payer: Self-pay | Admitting: *Deleted

## 2017-03-16 NOTE — Telephone Encounter (Signed)
Left message on voicemail in reference to upcoming appointment scheduled for 03/21/17 Phone number given for a call back so details instructions can be given. Lily Velasquez Jacqueline   

## 2017-03-16 NOTE — Telephone Encounter (Signed)
Patient given detailed instructions per Myocardial Perfusion Study Information Sheet for the test on 03/21/17  Patient notified to arrive 15 minutes early and that it is imperative to arrive on time for appointment to keep from having the test rescheduled.  If you need to cancel or reschedule your appointment, please call the office within 24 hours of your appointment. . Patient verbalized understanding per Allen KellAnne Young

## 2017-03-21 ENCOUNTER — Encounter (HOSPITAL_COMMUNITY): Payer: Self-pay

## 2017-03-21 ENCOUNTER — Other Ambulatory Visit: Payer: Self-pay | Admitting: Physician Assistant

## 2017-03-21 DIAGNOSIS — I739 Peripheral vascular disease, unspecified: Secondary | ICD-10-CM

## 2017-03-23 ENCOUNTER — Other Ambulatory Visit: Payer: Self-pay | Admitting: Internal Medicine

## 2017-03-23 DIAGNOSIS — M10042 Idiopathic gout, left hand: Secondary | ICD-10-CM

## 2017-03-27 ENCOUNTER — Telehealth (HOSPITAL_COMMUNITY): Payer: Self-pay | Admitting: *Deleted

## 2017-03-27 NOTE — Telephone Encounter (Signed)
Left message on voicemail in reference to upcoming appointment scheduled for 04/03/17. Phone number given for a call back so details instructions can be given. Duff Pozzi, Adelene IdlerCynthia W

## 2017-04-03 ENCOUNTER — Telehealth (HOSPITAL_COMMUNITY): Payer: Self-pay | Admitting: Physician Assistant

## 2017-04-03 ENCOUNTER — Ambulatory Visit (HOSPITAL_COMMUNITY): Payer: Medicare Other

## 2017-04-03 ENCOUNTER — Ambulatory Visit (HOSPITAL_COMMUNITY)
Admission: EM | Admit: 2017-04-03 | Discharge: 2017-04-03 | Disposition: A | Discharge status: Home / Self Care | Payer: Medicare Other | Attending: Emergency Medicine | Admitting: Emergency Medicine

## 2017-04-03 ENCOUNTER — Encounter (HOSPITAL_COMMUNITY): Payer: Self-pay | Admitting: Emergency Medicine

## 2017-04-03 DIAGNOSIS — K0889 Other specified disorders of teeth and supporting structures: Secondary | ICD-10-CM

## 2017-04-03 MED ORDER — AMOXICILLIN-POT CLAVULANATE 875-125 MG PO TABS: 1.0000 | ORAL_TABLET | Freq: Two times a day (BID) | ORAL | 0 refills | Status: AC

## 2017-04-03 MED ORDER — HYDROCODONE-ACETAMINOPHEN 5-325 MG PO TABS: 1.0000 | ORAL_TABLET | Freq: Four times a day (QID) | ORAL | 0 refills | Status: AC | PRN

## 2017-04-03 NOTE — Discharge Instructions (Addendum)
Please call around to try and find a dentist to help with your dental pain.  Take antibiotic, which should help with pain as infection clears up.  Pain medicine prescribed only for severe pain. Use over the counter ibuprofen 800 mg every 8 hours as needed for mild-moderate tooth pain.

## 2017-04-03 NOTE — ED Triage Notes (Signed)
PT reports recurrent dental issues with right lower jaw. PT reports pain and swelling for 1.5 weeks. PT reports pain in mouth, face, jaw, neck, ear, and eye

## 2017-04-03 NOTE — Telephone Encounter (Signed)
Called pt and spoke with patient's father and asked that Mr. Janik call back to reschedule a test. Father voiced understanding and took down my number.

## 2017-04-03 NOTE — ED Provider Notes (Signed)
[place]    CSN: [id number] Arrival date & time: 04/03/17  1515     History   Chief Complaint Chief Complaint  Patient presents with  . Dental Pain    HPI Daniel Perkins is a 61 y.o. male presenting with dental pain. He has experienced pain in his right lower jaw for 1.5 weeks. Pain radiates into face, eyes, jaw, neck. Pain is 10/10. Worsens with movement. He has felt little pieces breaking off over the past week. He has tried ibuprofen and tylenol without relief. Has associated headaches. He has tried to call around and find a dentist that will accept medicare but has been unsuccessful. No fever, no difficulty swallowing.   HPI  Past Medical History:  Diagnosis Date  . Club foot of both lower extremities 1958  . Coronary artery disease    Holter monitor 02/2012 - sinus with rare PVC  . CTEV (congenital talipes equinovarus)   . Gout   . Hyperlipidemia   . Hypertension   . Myocardial infarction [facility]) ~ 2007   "mild"  . Osteomyelitis ([place])   . Pneumonia 2000's   "couple times"  . Type II diabetes mellitus ([place]) 2011    Patient Active Problem List   Diagnosis Date Noted  . Rotator cuff tendonitis, left 02/14/2017  . Toe cyanosis 02/14/2017  . Skin lesion 03/23/2016  . Pulsatile mass of left upper extremity 06/19/2015  . Cough present for greater than 3 weeks 03/19/2015  . Sleep apnea-like behavior 10/01/2014  . Chest pain 09/24/2014  . Right knee pain 07/01/2014  . Peripheral vascular disease ([place]) 02/10/2014  . Muscle cramps 10/03/2013  . Microcytic anemia 02/06/2013  . CTEV (congenital talipes equinovarus)   . Statin intolerance 06/29/2012  . Esophageal reflux 06/29/2012  . Health care maintenance 06/28/2012  . Gout 06/12/2012  . Obesity, Class II, BMI 35-39.9 02/20/2012  . CAD (coronary artery disease) 08/18/2010  . Diabetes ([place]) 09/06/2009  . ERECTILE DYSFUNCTION, NON-ORGANIC 02/16/2009  . Hyperlipemia 01/14/2009  . HYPERTENSION  01/14/2009    Past Surgical History:  Procedure Laterality Date  . ANTERIOR CERVICAL DECOMP/DISCECTOMY FUSION  X 2  . CARDIAC CATHETERIZATION    . FOOT FRACTURE SURGERY Right 1990's   "pt a steel plate in"  . FOOT SURGERY Left x16   "joints collapsed; keep getting infected"  . POSTERIOR CERVICAL FUSION/FORAMINOTOMY  X 2       Home Medications    Prior to Admission medications   Medication Sig Start Date End Date Taking? Authorizing Provider  allopurinol (ZYLOPRIM) 100 MG tablet TAKE 1 TABLET BY MOUTH  DAILY 03/25/17  Yes Shela Leff, MD  amLODipine (NORVASC) 5 MG tablet TAKE 1 TABLET BY MOUTH  DAILY 01/03/17  Yes Shela Leff, MD  aspirin EC 81 MG tablet Take 1 tablet (81 mg total) by mouth daily. 03/19/15  Yes Burgess Estelle, MD  carvedilol (COREG) 12.5 MG tablet Take 1 tablet (12.5 mg total) by mouth 2 (two) times daily. 11/02/16 11/02/17 Yes Shela Leff, MD  fluvastatin (LESCOL) 20 MG capsule Take 1 capsule (20 mg total) by mouth every evening. 11/02/16 11/02/17 Yes Shela Leff, MD  glipiZIDE (GLUCOTROL) 5 MG tablet Take 1 tablet (5 mg total) by mouth 2 (two) times daily before a meal. 11/08/16 11/08/17 Yes Shela Leff, MD  liraglutide (VICTOZA) 18 MG/3ML SOPN Inject 0.3 mLs (1.8 mg total) into the skin daily. 11/08/16  Yes Shela Leff, MD  lisinopril (PRINIVIL,ZESTRIL) 20 MG tablet Take 1 tablet (20 mg total)  by mouth daily. 07/14/16  Yes Shela Leff, MD  acetaminophen (TYLENOL) 500 MG tablet Take 1,000 mg by mouth every 6 (six) hours as needed for moderate pain (gout).    [provider]  amoxicillin-clavulanate (AUGMENTIN) 875-125 MG tablet Take 1 tablet by mouth every 12 (twelve) hours for 7 days. 04/03/17 04/10/17  De Jaworski C, PA-C  Blood Glucose Calibration (ONETOUCH VERIO) SOLN Use to check blood glucose as instructed. E11.9. 03/08/17   Shela Leff, MD  Blood Glucose Monitoring Suppl (ONE TOUCH ULTRA 2) W/DEVICE KIT  Check blood sugar twice daily as instructed dx code 250.00 07/10/13   Jessee Avers, MD  colchicine 0.6 MG tablet Take 1 tablet (0.6 mg total) by mouth daily as needed (gout). For gout flare 11/18/16 11/18/17  Shela Leff, MD  glucose blood (ONETOUCH VERIO) test strip UsE 3 TIMES DAILY TO TEST BLOOD SUGAR. 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 04/03/17 04/06/17  [place] Basista C, PA-C  Insulin Pen Needle 31G X 5 MM MISC Use to inject liraglutide once daily as instructed Dx E11.9 03/08/17   Shela Leff, MD  LANCETS ULTRA THIN 30G MISC Use to check your blood sugar 3 times a day as instructed. 01/12/16   Shela Leff, MD  nitroGLYCERIN (NITROSTAT) 0.4 MG SL tablet Place 1 tablet (0.4 mg total) under the tongue every 5 (five) minutes as needed for chest pain. Do not take more than 3 tablets in 15 minutes. 11/02/16 11/02/17  Shela Leff, MD    Family History Family History  Problem Relation Age of Onset  . Diabetes Mother   . Coronary artery disease Mother        HAS PACEMAKER  . Heart disease Mother   . Heart attack Brother     Social History Social History   Tobacco Use  . Smoking status: Former Smoker    Packs/day: 1.00    Years: 32.00    Pack years: 32.00    Types: Cigarettes    Last attempt to quit: 07/06/2007    Years since quitting: 9.7  . Smokeless tobacco: Never Used  Substance Use Topics  . Alcohol use: Yes    Alcohol/week: 0.0 oz    Comment: Sometimes.  . Drug use: No     Allergies   Morphine and Pravastatin sodium   Review of Systems Review of Systems  Constitutional: Negative for chills and fever.  HENT: Positive for dental problem. Negative for trouble swallowing.   Respiratory: Negative for shortness of breath.   Cardiovascular: Negative for chest pain.    Gastrointestinal: Negative for abdominal pain.  Musculoskeletal: Positive for neck pain.  Skin: Negative for rash.  Neurological: Positive for headaches. Negative for dizziness and light-headedness.     Physical Exam Triage Vital Signs ED Triage Vitals  Enc Vitals Group     BP 04/03/17 1538 132/88     Pulse Rate 04/03/17 1538 88     Resp 04/03/17 1538 16     Temp 04/03/17 1538 98.7 F (37.1 C)     Temp Source 04/03/17 1538 Oral     SpO2 04/03/17 1538 95 %     Weight 04/03/17 1538 230 lb (104.3 kg)     Height 04/03/17 1538 '5\' 8"'  (1.727 m)     Head Circumference --      Peak Flow --  Pain Score 04/03/17 1539 10     Pain Loc --      Pain Edu? --      Excl. in [place]? --    No data found.  Updated Vital Signs BP 132/88   Pulse 88   Temp 98.7 F (37.1 C) (Oral)   Resp 16   Ht '5\' 8"'  (1.727 m)   Wt 230 lb (104.3 kg)   SpO2 95%   BMI 34.97 kg/m    Physical Exam  Constitutional: He is oriented to person, place, and time. He appears well-developed and well-nourished.  HENT:  Head: Normocephalic and atraumatic.  Mouth/Throat: Oropharynx is clear and moist and mucous membranes are normal. No oral lesions. No trismus in the jaw. Abnormal dentition. No posterior oropharyngeal erythema.    Cardiovascular: Normal rate and regular rhythm.  Pulmonary/Chest: Effort normal and breath sounds normal. No respiratory distress. He has no wheezes.  Abdominal: Soft. He exhibits no distension.  Neurological: He is alert and oriented to person, place, and time.  Skin: Skin is warm and dry.     UC Treatments / Results  Labs (all labs ordered are listed, but only abnormal results are displayed) Labs Reviewed - No data to display  EKG  EKG Interpretation None       Radiology No results found.  Procedures Procedures (including critical care time)  Medications Ordered in UC Medications - No data to display   Initial Impression / Assessment and Plan / UC Course  I have  reviewed the triage vital signs and the nursing notes.  Pertinent labs & imaging results that were available during my care of the patient were reviewed by me and considered in my medical decision making (see chart for details).    Reviewed list of Dental Resources in the area with him, suggested a couple to try. He plans to call around.  Given Augmentin for possible infection. 3 day supply of Norco given (#12 supplied), patient did not appear to be drug seeking, registry checked. He has had no relief with OTC medicine, seemed to be in significant pain. Advised to take benadryl with medication if he feels itchy as he has reported itchiness with morphine.   Final Clinical Impressions(s) / UC Diagnoses   Final diagnoses:  Pain, dental    ED Discharge Orders        Ordered    amoxicillin-clavulanate (AUGMENTIN) 875-125 MG tablet  Every 12 hours     04/03/17 1552    HYDROcodone-acetaminophen (NORCO/VICODIN) 5-325 MG tablet  Every 6 hours PRN     04/03/17 1602       Controlled Substance Prescriptions [place] Controlled Substance Registry consulted? Yes, I have consulted the [place] Controlled Substances Registry for this patient, and feel the risk/benefit ratio today is favorable for proceeding with this prescription for a controlled substance.  Patient only had 1 prescription from 2017 for tramadol.    Janith Lima, [place] 04/03/17 [phone]

## 2017-04-04 ENCOUNTER — Telehealth (HOSPITAL_COMMUNITY): Payer: Self-pay | Admitting: *Deleted

## 2017-04-04 NOTE — Telephone Encounter (Signed)
Left message on voicemail in reference to upcoming appointment scheduled for 04/10/17. Phone number given for a call back so details instructions can be given. Daniel Perkins W   

## 2017-04-07 ENCOUNTER — Encounter (HOSPITAL_COMMUNITY): Payer: Self-pay

## 2017-04-07 ENCOUNTER — Ambulatory Visit (HOSPITAL_COMMUNITY)
Admission: RE | Admit: 2017-04-07 | Discharge: 2017-04-07 | Disposition: A | Discharge status: Home / Self Care | Payer: Medicare Other | Source: Ambulatory Visit | Attending: Cardiology | Admitting: Cardiology

## 2017-04-07 DIAGNOSIS — I739 Peripheral vascular disease, unspecified: Secondary | ICD-10-CM | POA: Diagnosis not present

## 2017-04-10 ENCOUNTER — Encounter (HOSPITAL_COMMUNITY): Payer: Medicare Other

## 2017-04-11 ENCOUNTER — Telehealth: Payer: Self-pay | Admitting: Physician Assistant

## 2017-04-11 ENCOUNTER — Telehealth (HOSPITAL_COMMUNITY): Payer: Self-pay | Admitting: *Deleted

## 2017-04-11 ENCOUNTER — Telehealth: Payer: Self-pay | Admitting: *Deleted

## 2017-04-11 DIAGNOSIS — I739 Peripheral vascular disease, unspecified: Secondary | ICD-10-CM

## 2017-04-11 NOTE — Telephone Encounter (Signed)
Left message with patients fatherl in reference to upcoming appointment scheduled for 04/17/17. Phone number given for a call back so details instructions can be given. Nils Thor, Adelene IdlerCynthia W

## 2017-04-11 NOTE — Telephone Encounter (Signed)
Pt has been notified of LEA w/ABI results/findings by phone with verbal understanding. Pt is agreeable to referral to PV. [facility]t with Dr. Kirke CorinArida or Dr. Allyson SabalBerry. Pt thanked me for my call today. Referral has been placed.

## 2017-04-11 NOTE — Telephone Encounter (Signed)
Pt has been notified of tests results

## 2017-04-11 NOTE — Telephone Encounter (Signed)
-----   Message from Rosalio MacadamiaLori C Gerhardt, NP sent at 04/11/2017 10:32 AM EST ----- Reviewed for Tereso NewcomerScott Weaver, PA Abnormal blood flow noted to the legs - needs PV referral as recommended.

## 2017-04-11 NOTE — Progress Notes (Signed)
Thank you Ms. McGraw-HillFiato

## 2017-04-11 NOTE — Telephone Encounter (Signed)
Lmtcb with pt's father for ptcb to go over LEA US results and recommendations.

## 2017-04-11 NOTE — Telephone Encounter (Signed)
F/U call: ° °Patient returning your call  °

## 2017-04-11 NOTE — Telephone Encounter (Signed)
-----   Message from Lori C Gerhardt, NP sent at 04/11/2017 10:32 AM EST ----- Reviewed for Scott Weaver, PA Abnormal blood flow noted to the legs - needs PV referral as recommended. 

## 2017-04-13 ENCOUNTER — Telehealth: Payer: Self-pay | Admitting: Physician Assistant

## 2017-04-13 NOTE — Telephone Encounter (Signed)
I returned pt's call again. Father states pt is not home.

## 2017-04-13 NOTE — Telephone Encounter (Signed)
F/U Call: ° °Patient returning call for results. °

## 2017-04-13 NOTE — Telephone Encounter (Signed)
Left message with pt's father. Pt called today about test results. I went over test results with pt on 04/11/17.

## 2017-04-13 NOTE — Telephone Encounter (Signed)
New message     Patient calling back for test results ,  His father did not write them down,   Please call after 1030am if you cant call  Him in the next 15 minutes

## 2017-04-17 ENCOUNTER — Encounter (HOSPITAL_COMMUNITY): Payer: Medicare Other

## 2017-04-18 ENCOUNTER — Ambulatory Visit: Payer: Self-pay | Admitting: Cardiovascular Disease

## 2017-04-18 ENCOUNTER — Other Ambulatory Visit: Payer: Self-pay | Admitting: Internal Medicine

## 2017-04-18 DIAGNOSIS — E119 Type 2 diabetes mellitus without complications: Secondary | ICD-10-CM

## 2017-04-19 ENCOUNTER — Telehealth (HOSPITAL_COMMUNITY): Payer: Self-pay | Admitting: Radiology

## 2017-04-19 NOTE — Telephone Encounter (Signed)
Patient given detailed instructions per Myocardial Perfusion Study Information Sheet for the test on 04/26/2017 at 7:15. Patient notified to arrive 15 minutes early and that it is imperative to arrive on time for appointment to keep from having the test rescheduled.  If you need to cancel or reschedule your appointment, please call the office within 24 hours of your appointment. . Patient verbalized understanding.EHK

## 2017-04-20 ENCOUNTER — Other Ambulatory Visit: Payer: Self-pay | Admitting: Internal Medicine

## 2017-04-20 DIAGNOSIS — M10042 Idiopathic gout, left hand: Secondary | ICD-10-CM

## 2017-04-20 DIAGNOSIS — E119 Type 2 diabetes mellitus without complications: Secondary | ICD-10-CM

## 2017-04-20 NOTE — Telephone Encounter (Signed)
Next appt scheduled 12/18 with PCP. 

## 2017-04-25 ENCOUNTER — Ambulatory Visit (INDEPENDENT_AMBULATORY_CARE_PROVIDER_SITE_OTHER): Payer: Medicare Other | Admitting: Internal Medicine

## 2017-04-25 ENCOUNTER — Encounter: Payer: Self-pay | Admitting: Internal Medicine

## 2017-04-25 ENCOUNTER — Other Ambulatory Visit: Payer: Self-pay

## 2017-04-25 VITALS — BP 144/78 | HR 85 | Temp 97.7°F | Ht 68.0 in | Wt 236.7 lb

## 2017-04-25 DIAGNOSIS — Z87891 Personal history of nicotine dependence: Secondary | ICD-10-CM | POA: Diagnosis not present

## 2017-04-25 DIAGNOSIS — I10 Essential (primary) hypertension: Secondary | ICD-10-CM

## 2017-04-25 DIAGNOSIS — M109 Gout, unspecified: Secondary | ICD-10-CM

## 2017-04-25 DIAGNOSIS — Z0000 Encounter for general adult medical examination without abnormal findings: Secondary | ICD-10-CM

## 2017-04-25 DIAGNOSIS — M1A9XX0 Chronic gout, unspecified, without tophus (tophi): Secondary | ICD-10-CM | POA: Diagnosis not present

## 2017-04-25 DIAGNOSIS — E119 Type 2 diabetes mellitus without complications: Secondary | ICD-10-CM | POA: Diagnosis not present

## 2017-04-25 DIAGNOSIS — M10042 Idiopathic gout, left hand: Secondary | ICD-10-CM | POA: Diagnosis not present

## 2017-04-25 DIAGNOSIS — Z79899 Other long term (current) drug therapy: Secondary | ICD-10-CM | POA: Diagnosis not present

## 2017-04-25 DIAGNOSIS — Z7984 Long term (current) use of oral hypoglycemic drugs: Secondary | ICD-10-CM

## 2017-04-25 LAB — GLUCOSE, CAPILLARY: Glucose-Capillary: 389 mg/dL — ABNORMAL HIGH (ref 65–99)

## 2017-04-25 MED ORDER — GLIPIZIDE 5 MG PO TABS: ORAL_TABLET | ORAL | 3 refills | Status: DC

## 2017-04-25 MED ORDER — LISINOPRIL 40 MG PO TABS: 40.0000 mg | ORAL_TABLET | Freq: Every day | ORAL | 0 refills | Status: DC

## 2017-04-25 NOTE — Patient Instructions (Addendum)
[name] it was nice seeing you today.  -For diabetes:  Continue taking Glipizide and Victoza as instructed [facility]   -For high blood pressure: Dose of Lisinopril has been increased: take Lisinopril 40 mg once daily  Continue taking Amlodipine and Carvedilol as before  Return  When: In 4-6 weeks For: Diabetes and blood pressure checkup   Please call us if you have any problems getting your medications.

## 2017-04-26 ENCOUNTER — Telehealth: Payer: Self-pay | Admitting: *Deleted

## 2017-04-26 ENCOUNTER — Ambulatory Visit (HOSPITAL_COMMUNITY): Payer: Medicare Other | Attending: Internal Medicine

## 2017-04-26 DIAGNOSIS — R0789 Other chest pain: Secondary | ICD-10-CM

## 2017-04-26 LAB — MYOCARDIAL PERFUSION IMAGING
CHL CUP NUCLEAR SDS: 2
CHL CUP NUCLEAR SRS: 4
CHL CUP NUCLEAR SSS: 6
CSEPPHR: 89 {beats}/min
LV dias vol: 97 mL (ref 62–150)
LV sys vol: 37 mL
RATE: 0.34
Rest HR: 74 {beats}/min
TID: 1.07

## 2017-04-26 LAB — URIC ACID: Uric Acid: 4.8 mg/dL (ref 3.7–8.6)

## 2017-04-26 MED ORDER — TECHNETIUM TC 99M TETROFOSMIN IV KIT
10.7000 | PACK | Freq: Once | INTRAVENOUS | Status: AC | PRN
  Administered 2017-04-26: 10.7 via INTRAVENOUS
  Filled 2017-04-26: qty 11

## 2017-04-26 MED ORDER — REGADENOSON 0.4 MG/5ML IV SOLN
0.4000 mg | Freq: Once | INTRAVENOUS | Status: AC
  Administered 2017-04-26: 0.4 mg via INTRAVENOUS

## 2017-04-26 MED ORDER — TECHNETIUM TC 99M TETROFOSMIN IV KIT
32.8000 | PACK | Freq: Once | INTRAVENOUS | Status: AC | PRN
  Administered 2017-04-26: 32.8 via INTRAVENOUS
  Filled 2017-04-26: qty 33

## 2017-04-26 NOTE — Progress Notes (Signed)
   CC: Patient has no complaints. Chronic medical conditions including hypertension, diabetes, and gout were discussed during this visit.  HPI:  [name] is a 61 y.o. male with a past medical history of conditions listed below presenting to the clinic for a regular checkup.  His chronic medical conditions including hypertension, diabetes, and gout were discussed during this visit. Please see problem based charting for the status of the patient's current and chronic medical conditions.   Past Medical History:  Diagnosis Date  . Club foot of both lower extremities 1958  . Coronary artery disease    Holter monitor 02/2012 - sinus with rare PVC  . CTEV (congenital talipes equinovarus)   . Gout   . Hyperlipidemia   . Hypertension   . Myocardial infarction [facility](HCC) ~ 2007   "mild"  . Osteomyelitis (HCC)   . Pneumonia 2000's   "couple times"  . Type II diabetes mellitus (HCC) 2011   Review of Systems: Pertinent positives mentioned in HPI. Remainder of all ROS negative.   Physical Exam:  Vitals:   04/25/17 1409 04/25/17 1455  BP: (!) 151/78 (!) 144/78  Pulse: 89 85  Temp: 97.7 F (36.5 C)   TempSrc: Oral   SpO2: 99%   Weight: 236 lb 11.2 oz (107.4 kg)   Height: 5\' 8"  (1.727 m)    Physical Exam  Constitutional: He is oriented to person, place, and time. He appears well-developed and well-nourished. No distress.  HENT:  Head: Normocephalic and atraumatic.  Eyes: Right eye exhibits no discharge. Left eye exhibits no discharge.  Neck: Neck supple. No tracheal deviation present.  Cardiovascular: Normal rate, regular rhythm and intact distal pulses. Exam reveals no gallop and no friction rub.  Pulmonary/Chest: Effort normal and breath sounds normal. No respiratory distress. He has no wheezes. He has no rales.  Abdominal: Soft. Bowel sounds are normal. He exhibits no distension. There is no tenderness.  Musculoskeletal: He exhibits no edema.  Neurological: He is alert and  oriented to person, place, and time.  Skin: Skin is warm and dry. No rash noted. No erythema.  Psychiatric: His behavior is normal.    Assessment & Plan:   See Encounters Tab for problem based charting.  Patient discussed with Dr. Rogelia BogaButcher

## 2017-04-26 NOTE — Assessment & Plan Note (Signed)
Patient states he had a [facility] done [facility] earlier this year.  Our office will try to obtain records from them. States he has already received his influenza vaccine in October at [facility].  Advised him to bring records with him to the next visit.

## 2017-04-26 NOTE — Telephone Encounter (Signed)
-----   Message from Rosalio MacadamiaLori C Gerhardt, NP sent at 04/26/2017  2:25 PM EST ----- Ok to report. Reviewed for Tereso NewcomerScott Weaver, PA Low risk Myoview. EF is normal at 61% and normal resting and stress perfusion.

## 2017-04-26 NOTE — Assessment & Plan Note (Signed)
BP Readings from Last 3 Encounters:  04/25/17 (!) 144/78  04/03/17 132/88  03/15/17 (!) 154/80    Lab Results  Component Value Date   NA 136 02/14/2017   K 5.1 02/14/2017   CREATININE 1.09 02/14/2017    Assessment: Blood pressure control:  Above goal Comments: Current medication regimen includes amlodipine 5 mg daily, lisinopril 20 mg daily, and carvedilol 6.25 mg twice daily.  She reports compliance with medications.  Plan: Medications: Increased dose of lisinopril to 40 mg daily.  Continue amlodipine and carvedilol as above. Educational resources provided: brochure(denies need ). Educated patient about healthy eating and exercise. Emphasized the importance of weight loss.  Other plans: Return to the clinic in 1 month for blood pressure recheck.

## 2017-04-26 NOTE — Assessment & Plan Note (Addendum)
Uric acid level checked during previous visit elevated at 6.9 in the setting of allopurinol noncompliance. Goal uric acid level is less than 6.  Uric acid level checked at this visit 4.8.  He is currently taking allopurinol 100 mg daily and reports compliance with the medication for the past 1 month.  He also takes colchicine 0.6 mg daily as needed for flares. No recent flares.  Plan -Continue current regimen -Tried calling the patient but could not reach him over the phone.

## 2017-04-26 NOTE — Assessment & Plan Note (Signed)
Lab Results  Component Value Date   HGBA1C 11.0 02/14/2017   HGBA1C 9.4 11/08/2016   HGBA1C 10.4 (H) 03/22/2016     Assessment: Diabetes control:  Uncontrolled Comments: Current medication regimen includes metformin XR 500 mg daily, glipizide 5 mg twice daily, and Victoza 1.8 mg daily.  Patient states he was not able to tolerate even the lower dose of metformin due to diarrhea, as such, he stopped taking the medication.  He has not taking glipizide and Victoza for the past 3 weeks as he is not able to pay the co-pay for these medications until he receives his paycheck in January.  Explained to him that [facility] on the $4 wrist.  Patient stated he will be able to afford paying $4. Provided him with a sample of Victoza at this visit.   Plan: Medications: Continue glipizide 5 mg twice daily and Victoza 1.8 mg daily.  Discontinue metformin. Instruction/counseling given: reminded to get eye exam, reminded to bring medications to each visit, discussed foot care, discussed the need for weight loss, discussed diet and discussed sick day management Educational resources provided: brochure(denies need ) Other plans: [facility]  Foot exam done at this visit.  Return to the clinic in 1 month for A1c check.  Consider starting him on an SGL T-2 inhibitor or DPP-4 inhibitor at the next visit.

## 2017-04-26 NOTE — Telephone Encounter (Signed)
Left message with pt's father asking pt to please call the office tomorrow so that we may go over his test results.

## 2017-04-27 NOTE — Progress Notes (Signed)
Internal Medicine Clinic Attending  Case discussed with Dr. Rathoreat the time of the visit. We reviewed the resident's history and exam and pertinent patient test results. I agree with the assessment, diagnosis, and plan of care documented in the resident's note.  

## 2017-05-16 ENCOUNTER — Encounter (INDEPENDENT_AMBULATORY_CARE_PROVIDER_SITE_OTHER): Payer: Self-pay

## 2017-05-16 ENCOUNTER — Ambulatory Visit (INDEPENDENT_AMBULATORY_CARE_PROVIDER_SITE_OTHER): Payer: Medicare Other | Admitting: Cardiovascular Disease

## 2017-05-16 ENCOUNTER — Encounter: Payer: Self-pay | Admitting: Cardiovascular Disease

## 2017-05-16 VITALS — BP 144/76 | HR 80 | Ht 68.0 in | Wt 234.0 lb

## 2017-05-16 DIAGNOSIS — E785 Hyperlipidemia, unspecified: Secondary | ICD-10-CM

## 2017-05-16 DIAGNOSIS — I10 Essential (primary) hypertension: Secondary | ICD-10-CM | POA: Diagnosis not present

## 2017-05-16 DIAGNOSIS — Z79899 Other long term (current) drug therapy: Secondary | ICD-10-CM

## 2017-05-16 DIAGNOSIS — I739 Peripheral vascular disease, unspecified: Secondary | ICD-10-CM

## 2017-05-16 MED ORDER — CILOSTAZOL 50 MG PO TABS: 50.0000 mg | ORAL_TABLET | Freq: Two times a day (BID) | ORAL | 11 refills | Status: DC

## 2017-05-16 MED ORDER — ROSUVASTATIN CALCIUM 10 MG PO TABS: 10.0000 mg | ORAL_TABLET | Freq: Every day | ORAL | 11 refills | Status: DC

## 2017-05-16 NOTE — Patient Instructions (Addendum)
Medication Instructions: START Cilostazol (Pletal) 50 mg twice daily START Rosuvastatin 10 mg daily  If you need a refill on your cardiac medications before your next appointment, please call your pharmacy.   Labwork: Your physician recommends that you return for lab work in: 6 weeks for a FASTING Lipid and Liver panel   Follow-Up: Your physician wants you to follow-up in: 3 months with Dr. Kirke CorinArida. You will receive a reminder letter in the mail two months in advance. If you don't receive a letter, please call our office at [phone] to schedule this follow-up appointment.   Thank you for choosing [facility]!!

## 2017-05-16 NOTE — Progress Notes (Signed)
**Note Daniel-Identified via Obfuscation** Cardiology Office Note   Date:  05/17/2017   ID:  Daniel Perkins, DOB Feb 21, 1956, MRN [id number]  PCP:  Shela Leff, MD  Cardiologist:  Dr. Harrington Challenger.   No chief complaint on file.     History of Present Illness: Daniel Perkins is a 62 y.o. male who was referred by Richardson Dopp for evaluation and management of peripheral arterial disease. He has history of minimal coronary plaque by previous cardiac catheterization, diabetes, hypertension and hyperlipidemia.  He quit smoking in 2010.  He was seen recently for atypical chest pain.  He underwent a nuclear stress test which showed no evidence of ischemia with normal ejection fraction. He had a screening ABI that was abnormal.  He underwent lower extremity arterial Doppler which showed normal ABI on the left side and mildly to moderately reduced on the right side at 0.78.  Duplex showed occluded mid SFA in a short segment with one vessel runoff below the knee via the peroneal artery. He has known history of clubfoot of both lower extremities which limits his physical activities.  He reports having multiple foot surgeries in the past. He reports prolonged symptoms of bilateral leg cramps and discomfort mostly at walking a variable distance.  The pain is equal in both legs.  He also has cramps at night.  He thought the symptoms were due to pravastatin and he stopped the medication with no change in symptoms.  He is limited by shortness of breath and denies any chest pain.  Past Medical History:  Diagnosis Date  . Club foot of both lower extremities 1958  . Coronary artery disease    Holter monitor 02/2012 - sinus with rare PVC  . CTEV (congenital talipes equinovarus)   . Gout   . Hyperlipidemia   . Hypertension   . Myocardial infarction [facility]) ~ 2007   "mild"  . Osteomyelitis ([place])   . Pneumonia 2000's   "couple times"  . Type II diabetes mellitus ([place]) 2011    Past Surgical History:  Procedure Laterality Date  . ANTERIOR  CERVICAL DECOMP/DISCECTOMY FUSION  X 2  . CARDIAC CATHETERIZATION    . FOOT FRACTURE SURGERY Right 1990's   "pt a steel plate in"  . FOOT SURGERY Left x16   "joints collapsed; keep getting infected"  . POSTERIOR CERVICAL FUSION/FORAMINOTOMY  X 2     Current Outpatient Medications  Medication Sig Dispense Refill  . allopurinol (ZYLOPRIM) 100 MG tablet TAKE 1 TABLET BY MOUTH  DAILY 90 tablet 0  . amLODipine (NORVASC) 5 MG tablet TAKE 1 TABLET BY MOUTH  DAILY 90 tablet 3  . aspirin EC 81 MG tablet Take 1 tablet (81 mg total) by mouth daily. 30 tablet 3  . Blood Glucose Calibration (ONETOUCH VERIO) SOLN Use to check blood glucose as instructed. E11.9. 1 each 0  . Blood Glucose Monitoring Suppl (ONE TOUCH ULTRA 2) W/DEVICE KIT Check blood sugar twice daily as instructed dx code 250.00 1 each 0  . carvedilol (COREG) 12.5 MG tablet Take 1 tablet (12.5 mg total) by mouth 2 (two) times daily. 180 tablet 1  . colchicine 0.6 MG tablet TAKE 1 TABLET BY MOUTH  DAILY AS NEEDED FOR GOUT  FLARE 30 tablet 0  . glipiZIDE (GLUCOTROL) 5 MG tablet TAKE 1 TABLET BY MOUTH  TWICE A DAY BEFORE A MEAL 60 tablet 3  . glucose blood (ONETOUCH VERIO) test strip UsE 3 TIMES DAILY TO TEST BLOOD SUGAR. E11.9. INSULIN DEPENDENT 100 each 6  . Insulin  Pen Needle 31G X 5 MM MISC Use to inject liraglutide once daily as instructed Dx E11.9 100 each 5  . LANCETS ULTRA THIN 30G MISC Use to check your blood sugar 3 times a day as instructed. 100 each 5  . liraglutide (VICTOZA) 18 MG/3ML SOPN Inject 1.8 mg into the skin daily as instructed 3 pen 1  . lisinopril (PRINIVIL,ZESTRIL) 40 MG tablet Take 1 tablet (40 mg total) by mouth daily. 90 tablet 0  . nitroGLYCERIN (NITROSTAT) 0.4 MG SL tablet Place 1 tablet (0.4 mg total) under the tongue every 5 (five) minutes as needed for chest pain. Do not take more than 3 tablets in 15 minutes. 30 tablet 2  . acetaminophen (TYLENOL) 500 MG tablet Take 1,000 mg by mouth every 6 (six) hours as  needed for moderate pain (gout).    . cilostazol (PLETAL) 50 MG tablet Take 1 tablet (50 mg total) by mouth 2 (two) times daily. 60 tablet 11  . fluvastatin (LESCOL) 20 MG capsule Take 1 capsule (20 mg total) by mouth every evening. 90 capsule 1  . rosuvastatin (CRESTOR) 10 MG tablet Take 1 tablet (10 mg total) by mouth daily. 30 tablet 11   Current Facility-Administered Medications  Medication Dose Route Frequency Provider Last Rate Last Dose  . diclofenac (VOLTAREN) EC tablet 25 mg  25 mg Oral Daily Chundi, Vahini, MD        Allergies:   Morphine and Pravastatin sodium    Social History:  The patient  reports that he quit smoking about 9 years ago. His smoking use included cigarettes. He has a 32.00 pack-year smoking history. he has never used smokeless tobacco. He reports that he drinks alcohol. He reports that he does not use drugs.   Family History:  The patient's family history includes Coronary artery disease in his mother; Diabetes in his mother; Heart attack in his brother; Heart disease in his mother.    ROS:  Please see the history of present illness.   Otherwise, review of systems are positive for none.   All other systems are reviewed and negative.    PHYSICAL EXAM: VS:  BP (!) 144/76   Pulse 80   Ht '5\' 8"'  (1.727 m)   Wt 234 lb (106.1 kg)   BMI 35.58 kg/m  , BMI Body mass index is 35.58 kg/m. GEN: Well nourished, well developed, in no acute distress  HEENT: normal  Neck: no JVD, carotid bruits, or masses Cardiac: RRR; no rubs, or gallops,no edema .  2 out of 6 systolic ejection murmur in the aortic area. Respiratory:  clear to auscultation bilaterally, normal work of breathing GI: soft, nontender, nondistended, + BS MS: no deformity or atrophy  Skin: warm and dry, no rash Neuro:  Strength and sensation are intact Psych: euthymic mood, full affect Vascular: Femoral pulses normal bilaterally.  Distal pulses are not palpable on the right and faint on the left  side.   EKG:  EKG is not ordered today.    Recent Labs: 02/14/2017: BUN 15; Creatinine, Ser 1.09; Potassium 5.1; Sodium 136    Lipid Panel    Component Value Date/Time   CHOL 180 02/14/2017 1115   TRIG 114 02/14/2017 1115   HDL 37 (L) 02/14/2017 1115   CHOLHDL 4.9 02/14/2017 1115   CHOLHDL 6.1 06/30/2014 1634   VLDL 45 (H) 06/30/2014 1634   LDLCALC 120 (H) 02/14/2017 1115      Wt Readings from Last 3 Encounters:  05/16/17 234 lb (106.1  kg)  04/26/17 232 lb (105.2 kg)  04/25/17 236 lb 11.2 oz (107.4 kg)      No flowsheet data found.    ASSESSMENT AND PLAN:  1.  Peripheral arterial disease: Atypical bilateral leg claudication.  In spite of disease being more significant on the right side, he reports equal severity of symptoms in both legs. I discussed with him the natural history and management of peripheral arterial disease.  I recommend aggressive treatment of risk factors. Given his symptoms, I elected to add cilostazol 50 mg twice daily and I instructed him to start a daily walking program.  If symptoms do not improve in 3-6 months, we can consider proceeding with angiography and possible endovascular intervention.  Continue low-dose aspirin.  2.  Essential hypertension: Blood pressure is controlled on current medications.  3.  Hyperlipidemia: He has known history of hyperlipidemia with LDL ranging from 120-148.  He stopped taking pravastatin due to bilateral leg cramps but symptoms did not change after he stopped the medication.  The cramps are likely due to peripheral arterial disease.  I elected to add rosuvastatin 10 mg daily.  Check lipid and liver profile in 6 weeks.    Disposition:   FU with me in 3 months  Signed,  Kathlyn Sacramento, MD  05/17/2017 1:57 PM    [facility]

## 2017-06-27 ENCOUNTER — Other Ambulatory Visit: Payer: Self-pay

## 2017-06-27 ENCOUNTER — Ambulatory Visit (INDEPENDENT_AMBULATORY_CARE_PROVIDER_SITE_OTHER): Payer: Medicare Other | Admitting: Internal Medicine

## 2017-06-27 ENCOUNTER — Encounter: Payer: Self-pay | Admitting: Internal Medicine

## 2017-06-27 ENCOUNTER — Encounter (INDEPENDENT_AMBULATORY_CARE_PROVIDER_SITE_OTHER): Payer: Self-pay

## 2017-06-27 VITALS — BP 191/96 | HR 84 | Temp 98.3°F | Ht 68.0 in | Wt 237.8 lb

## 2017-06-27 DIAGNOSIS — E119 Type 2 diabetes mellitus without complications: Secondary | ICD-10-CM | POA: Diagnosis not present

## 2017-06-27 DIAGNOSIS — I25118 Atherosclerotic heart disease of native coronary artery with other forms of angina pectoris: Secondary | ICD-10-CM

## 2017-06-27 DIAGNOSIS — I2510 Atherosclerotic heart disease of native coronary artery without angina pectoris: Secondary | ICD-10-CM

## 2017-06-27 DIAGNOSIS — Z79899 Other long term (current) drug therapy: Secondary | ICD-10-CM | POA: Diagnosis not present

## 2017-06-27 DIAGNOSIS — M109 Gout, unspecified: Secondary | ICD-10-CM

## 2017-06-27 DIAGNOSIS — I10 Essential (primary) hypertension: Secondary | ICD-10-CM | POA: Diagnosis not present

## 2017-06-27 DIAGNOSIS — D509 Iron deficiency anemia, unspecified: Secondary | ICD-10-CM | POA: Diagnosis not present

## 2017-06-27 DIAGNOSIS — R230 Cyanosis: Secondary | ICD-10-CM | POA: Diagnosis not present

## 2017-06-27 DIAGNOSIS — Z7984 Long term (current) use of oral hypoglycemic drugs: Secondary | ICD-10-CM

## 2017-06-27 DIAGNOSIS — M10042 Idiopathic gout, left hand: Secondary | ICD-10-CM

## 2017-06-27 LAB — POCT GLYCOSYLATED HEMOGLOBIN (HGB A1C): HEMOGLOBIN A1C: 8.2

## 2017-06-27 LAB — GLUCOSE, CAPILLARY: Glucose-Capillary: 161 mg/dL — ABNORMAL HIGH (ref 65–99)

## 2017-06-27 MED ORDER — ALLOPURINOL 100 MG PO TABS: 100.0000 mg | ORAL_TABLET | Freq: Every day | ORAL | 0 refills | Status: DC

## 2017-06-27 MED ORDER — GLIPIZIDE 10 MG PO TABS: ORAL_TABLET | ORAL | 0 refills | Status: DC

## 2017-06-27 MED ORDER — LISINOPRIL 40 MG PO TABS: 40.0000 mg | ORAL_TABLET | Freq: Every day | ORAL | 0 refills | Status: DC

## 2017-06-27 MED ORDER — AMLODIPINE BESYLATE 10 MG PO TABS: 10.0000 mg | ORAL_TABLET | Freq: Every day | ORAL | 0 refills | Status: DC

## 2017-06-27 NOTE — Patient Instructions (Addendum)
[name] it was nice seeing you today.  Take the following medications for your high blood pressure: Amlodipine 10 mg once daily Lisinopril 40 mg once daily Carvedilol 6.25 mg twice daily  For your diabetes: Take glipizide 10 mg twice daily Use Victoza 1.8 mg daily Please check your blood sugar in the morning and whenever you do not feel well.  Please call and make an appointment with cardiology for a follow-up.  Please return for a follow-up in 2-3 weeks for your high blood pressure.

## 2017-06-27 NOTE — Progress Notes (Signed)
   CC: Follow-up of hypertension and diabetes  HPI:  Daniel Perkins is a 62 y.o. male with past medical history of conditions listed below presenting to the clinic for a follow-up of hypertension and diabetes. Please see problem based charting for the status of the patient's current and chronic medical conditions.   Past Medical History:  Diagnosis Date  . Club foot of both lower extremities 1958  . Coronary artery disease    Holter monitor 02/2012 - sinus with rare PVC  . CTEV (congenital talipes equinovarus)   . Gout   . Hyperlipidemia   . Hypertension   . Myocardial infarction [facility](HCC) ~ 2007   "mild"  . Osteomyelitis (HCC)   . Pneumonia 2000's   "couple times"  . Type II diabetes mellitus (HCC) 2011   Review of Systems: Pertinent positives mentioned in HPI. Remainder of all ROS negative.   Physical Exam:  Vitals:   06/27/17 1434 06/27/17 1531  BP: (!) 180/93 (!) 191/96  Pulse: 84 84  Temp: 98.3 F (36.8 C)   TempSrc: Oral   SpO2: 98%   Weight: 237 lb 12.8 oz (107.9 kg)   Height: 5\' 8"  (1.727 m)    Physical Exam  Constitutional: He is oriented to person, place, and time. He appears well-developed and well-nourished. No distress.  HENT:  Head: Normocephalic and atraumatic.  Mouth/Throat: Oropharynx is clear and moist.  Eyes: Right eye exhibits no discharge. Left eye exhibits no discharge.  Cardiovascular: Normal rate, regular rhythm and intact distal pulses.  Pulmonary/Chest: Effort normal and breath sounds normal. No respiratory distress. He has no wheezes. He has no rales.  Abdominal: Soft. Bowel sounds are normal. He exhibits no distension. There is no tenderness.  Musculoskeletal: He exhibits no edema.  Neurological: He is alert and oriented to person, place, and time.  Skin: Skin is warm and dry.  Toenails are long Cyanosis noted    Assessment & Plan:   See Encounters Tab for problem based charting.  Patient discussed with Dr.  Rogelia BogaButcher

## 2017-06-28 LAB — HEPATIC FUNCTION PANEL
ALBUMIN: 3.9 g/dL (ref 3.6–4.8)
ALK PHOS: 102 IU/L (ref 39–117)
ALT: 18 IU/L (ref 0–44)
AST: 15 IU/L (ref 0–40)
Bilirubin Total: <0.2 mg/dL (ref 0.0–1.2)
Bilirubin, Direct: 0.08 mg/dL (ref 0.00–0.40)
TOTAL PROTEIN: 7.4 g/dL (ref 6.0–8.5)

## 2017-06-28 LAB — IRON AND TIBC
IRON SATURATION: 13 % — AB (ref 15–55)
Iron: 47 ug/dL (ref 38–169)
TIBC: 355 ug/dL (ref 250–450)
UIBC: 308 ug/dL (ref 111–343)

## 2017-06-28 LAB — FERRITIN: FERRITIN: 390 ng/mL (ref 30–400)

## 2017-06-28 NOTE — Assessment & Plan Note (Signed)
Uncontrolled.  Blood pressure continues to be elevated at this visit.  Current regimen includes amlodipine 5 mg daily, lisinopril 40 mg daily, and carvedilol 6.25 mg twice daily.  Patient states he did not take his medications in the morning.  Plan -Increase dose of amlodipine to 10 mg daily -Continue lisinopril and carvedilol as above -Return to the clinic in 2 weeks

## 2017-06-28 NOTE — Assessment & Plan Note (Signed)
No recent flare.  Uric acid checked during previous visit was within target range (less than 6).    Plan -Refill allopurinol

## 2017-06-28 NOTE — Assessment & Plan Note (Signed)
Ferritin was previously elevated in the 600s.  Patient is not taking an iron supplement. Repeat ferritin at this visit normal.  Hepatic function panel normal.

## 2017-06-28 NOTE — Assessment & Plan Note (Addendum)
Uncontrolled.  A1c improved to 8.2 at this visit.  Current regimen includes glipizide 5 mg twice daily and Victoza 1.8 mg daily.  Previously unable to tolerate even low-dose metformin due to diarrhea.  He denies having any hypoglycemic symptoms.  Plan -Increased dose of glipizide to 10 mg twice daily. Advised patient to stop the medication and inform us if he experiences any hypoglycemic symptoms. -Continue Victoza as above -Referral to podiatry for foot care

## 2017-06-28 NOTE — Assessment & Plan Note (Signed)
He continues to complain of episodic shortness of breath with exertion and a warm feeling rushing across his chest which is associated with lightheadedness.  Denies having any chest pain, wheezing, or coughing during these episodes.  Currently asymptomatic.  He is being followed by cardiology and nuclear stress test done in December 2018 was low risk.  Plan -Advised him to follow-up with cardiology.  He may need additional workup to rule out arrhythmia.

## 2017-06-30 NOTE — Progress Notes (Signed)
Internal Medicine Clinic Attending  Case discussed with Dr. Rathoreat the time of the visit. We reviewed the resident's history and exam and pertinent patient test results. I agree with the assessment, diagnosis, and plan of care documented in the resident's note.  

## 2017-07-03 ENCOUNTER — Telehealth: Payer: Self-pay | Admitting: *Deleted

## 2017-07-03 NOTE — Telephone Encounter (Signed)
Spoke with the patient and reminded him to come in for fasting labs. He stated that he will come in tomorrow.

## 2017-07-03 NOTE — Telephone Encounter (Signed)
Call placed to the patient to remind him to come in for his fasting labs. Message left with his father, per dpr.

## 2017-07-04 DIAGNOSIS — I10 Essential (primary) hypertension: Secondary | ICD-10-CM | POA: Diagnosis not present

## 2017-07-04 DIAGNOSIS — E785 Hyperlipidemia, unspecified: Secondary | ICD-10-CM | POA: Diagnosis not present

## 2017-07-04 DIAGNOSIS — Z79899 Other long term (current) drug therapy: Secondary | ICD-10-CM | POA: Diagnosis not present

## 2017-07-04 LAB — HEPATIC FUNCTION PANEL
ALBUMIN: 3.9 g/dL (ref 3.6–4.8)
ALT: 16 IU/L (ref 0–44)
AST: 15 IU/L (ref 0–40)
Alkaline Phosphatase: 109 IU/L (ref 39–117)
Bilirubin, Direct: 0.08 mg/dL (ref 0.00–0.40)
Total Protein: 7.5 g/dL (ref 6.0–8.5)

## 2017-07-04 LAB — LIPID PANEL
CHOL/HDL RATIO: 4.4 ratio (ref 0.0–5.0)
Cholesterol, Total: 162 mg/dL (ref 100–199)
HDL: 37 mg/dL — ABNORMAL LOW (ref >39)
LDL CALC: 98 mg/dL (ref 0–99)
Triglycerides: 135 mg/dL (ref 0–149)
VLDL Cholesterol Cal: 27 mg/dL (ref 5–40)

## 2017-07-11 ENCOUNTER — Encounter: Payer: Self-pay | Admitting: Internal Medicine

## 2017-07-11 ENCOUNTER — Ambulatory Visit: Payer: Medicare Other | Admitting: Pharmacist

## 2017-07-11 ENCOUNTER — Ambulatory Visit (INDEPENDENT_AMBULATORY_CARE_PROVIDER_SITE_OTHER): Payer: Medicare Other | Admitting: Internal Medicine

## 2017-07-11 ENCOUNTER — Other Ambulatory Visit: Payer: Self-pay

## 2017-07-11 VITALS — BP 138/74 | HR 89 | Temp 97.7°F | Ht 68.0 in | Wt 236.1 lb

## 2017-07-11 DIAGNOSIS — E785 Hyperlipidemia, unspecified: Secondary | ICD-10-CM | POA: Diagnosis not present

## 2017-07-11 DIAGNOSIS — G479 Sleep disorder, unspecified: Secondary | ICD-10-CM

## 2017-07-11 DIAGNOSIS — Z794 Long term (current) use of insulin: Secondary | ICD-10-CM | POA: Diagnosis not present

## 2017-07-11 DIAGNOSIS — G4739 Other sleep apnea: Secondary | ICD-10-CM

## 2017-07-11 DIAGNOSIS — I10 Essential (primary) hypertension: Secondary | ICD-10-CM | POA: Diagnosis not present

## 2017-07-11 DIAGNOSIS — E119 Type 2 diabetes mellitus without complications: Secondary | ICD-10-CM

## 2017-07-11 DIAGNOSIS — Z79899 Other long term (current) drug therapy: Secondary | ICD-10-CM | POA: Diagnosis not present

## 2017-07-11 LAB — GLUCOSE, CAPILLARY: GLUCOSE-CAPILLARY: 168 mg/dL — AB (ref 65–99)

## 2017-07-11 MED ORDER — EMPAGLIFLOZIN 10 MG PO TABS: 10.0000 mg | ORAL_TABLET | Freq: Every day | ORAL | 1 refills | Status: DC

## 2017-07-11 MED ORDER — AMLODIPINE BESYLATE 5 MG PO TABS: 5.0000 mg | ORAL_TABLET | Freq: Every day | ORAL | 0 refills | Status: DC

## 2017-07-11 MED ORDER — SITAGLIPTIN PHOSPHATE 100 MG PO TABS
100.0000 mg | ORAL_TABLET | Freq: Every day | ORAL | 1 refills | Status: DC
Start: 2017-07-11 — End: 2017-07-11

## 2017-07-11 MED ORDER — LISINOPRIL 20 MG PO TABS: 20.0000 mg | ORAL_TABLET | Freq: Every day | ORAL | 0 refills | Status: DC

## 2017-07-11 MED ORDER — ROSUVASTATIN CALCIUM 20 MG PO TABS: 20.0000 mg | ORAL_TABLET | Freq: Every day | ORAL | 3 refills | Status: DC

## 2017-07-11 NOTE — Patient Instructions (Addendum)
Mr. Gale JourneyWestmoreland it was nice seeing you today.  Continue taking amlodipine 5 mg daily Continue taking lisinopril 20 mg daily Continue taking carvedilol 12.5 mg twice daily  Start taking empagliflozin 10 mg once daily Continue using Victoza 1.8 mg daily Continue taking glipizide 10 mg twice daily  Dose of Crestor has been increased to 20 mg once daily

## 2017-07-12 ENCOUNTER — Telehealth: Payer: Self-pay | Admitting: *Deleted

## 2017-07-12 MED ORDER — CILOSTAZOL 50 MG PO TABS: 50.0000 mg | ORAL_TABLET | Freq: Two times a day (BID) | ORAL | 3 refills | Status: DC

## 2017-07-12 NOTE — Assessment & Plan Note (Signed)
Patient continues to complain of sleep apnea-like symptoms such as snoring at night and daytime somnolence.  Sleep study was ordered previously but has not been done yet.  Encourage patient to go get it done as soon as possible.

## 2017-07-12 NOTE — Telephone Encounter (Signed)
Pletal has been refilled for the patient at [facility] per his request.

## 2017-07-12 NOTE — Assessment & Plan Note (Signed)
Lab Results  Component Value Date   HGBA1C 8.2 06/27/2017   HGBA1C 11.0 02/14/2017   HGBA1C 9.4 11/08/2016     Assessment: Diabetes control:  A1c above goal Comments: He is currently using Victoza 1.8 mg daily and taking glipizide 10 mg twice daily. Denies having any hypoglycemic symptoms.  Blood glucose meter showing just a few readings with an average of 183 and no lows.  He has previously been unable to tolerate even low-dose metformin due to diarrhea.   Plan: Medications:  continue current medications.  Start empagliflozin 10 mg daily. Instruction/counseling given: reminded to get eye exam, reminded to bring blood glucose meter & log to each visit, reminded to bring medications to each visit, discussed foot care, discussed the need for weight loss, discussed diet and discussed sick day management Other plans: Repeat A1c 3 months from the last date.

## 2017-07-12 NOTE — Assessment & Plan Note (Signed)
BP Readings from Last 3 Encounters:  07/11/17 138/74  06/27/17 (!) 191/96  05/16/17 (!) 144/76    Lab Results  Component Value Date   NA 136 02/14/2017   K 5.1 02/14/2017   CREATININE 1.09 02/14/2017    Assessment: Blood pressure control:  Well-controlled Comments: Review of patient's medication bottles reveals he is taking amlodipine 5 mg daily, lisinopril 20 mg daily, and carvedilol 12.5 mg twice daily.  Plan: Medications:  continue current medications Educational resources provided:   Educated patient about healthy eating and exercise. Emphasized the importance of weight loss.

## 2017-07-12 NOTE — Progress Notes (Signed)
   CC: Hypertension follow-up  HPI:  [name] is a 62 y.o. male with a past medical history of conditions listed below presenting to the clinic for a follow-up of hypertension.  Sleep apnea-like symptoms, diabetes, and hyperlipidemia were also discussed. Please see problem based charting for the status of the patient's current and chronic medical conditions.   Past Medical History:  Diagnosis Date  . Club foot of both lower extremities 1958  . Coronary artery disease    Holter monitor 02/2012 - sinus with rare PVC  . CTEV (congenital talipes equinovarus)   . Gout   . Hyperlipidemia   . Hypertension   . Myocardial infarction [facility]) ~ 2007   "mild"  . Osteomyelitis (HCC)   . Pneumonia 2000's   "couple times"  . Type II diabetes mellitus ([facility]) 2011   Review of Systems:  Pertinent positives mentioned in HPI. Remainder of all ROS negative.   Physical Exam:  Vitals:   07/11/17 1514  BP: 138/74  Pulse: 89  Temp: 97.7 F (36.5 C)  TempSrc: Oral  SpO2: 97%  Weight: 236 lb 1.6 oz (107.1 kg)  Height: 5\' 8"  (1.727 m)   Physical Exam  Constitutional: He is oriented to person, place, and time. He appears well-developed and well-nourished. No distress.  HENT:  Head: Normocephalic and atraumatic.  Mouth/Throat: Oropharynx is clear and moist.  Eyes: Right eye exhibits no discharge. Left eye exhibits no discharge.  Cardiovascular: Normal rate, regular rhythm and intact distal pulses.  Pulmonary/Chest: Effort normal and breath sounds normal. No respiratory distress.  Abdominal: Soft. Bowel sounds are normal. He exhibits no distension. There is no tenderness.  Musculoskeletal: He exhibits no edema.  Neurological: He is alert and oriented to person, place, and time.  Skin: Skin is warm and dry.    Assessment & Plan:   See Encounters Tab for problem based charting.  Patient discussed with Dr. Heide SparkNarendra

## 2017-07-12 NOTE — Assessment & Plan Note (Signed)
LDL checked a week ago was 98.  He is currently on a moderate intensity stain and denies having any myalgias.  Plan -Increase dose to high intensity statin (Crestor 20 mg daily)

## 2017-07-13 ENCOUNTER — Ambulatory Visit: Payer: Medicare Other | Admitting: Internal Medicine

## 2017-07-13 ENCOUNTER — Ambulatory Visit: Payer: Self-pay | Admitting: Podiatry

## 2017-07-17 NOTE — Progress Notes (Signed)
Internal Medicine Clinic Attending  Case discussed with Dr. Rathoreat the time of the visit. We reviewed the resident's history and exam and pertinent patient test results. I agree with the assessment, diagnosis, and plan of care documented in the resident's note.  

## 2017-07-21 ENCOUNTER — Encounter: Payer: Self-pay | Admitting: Internal Medicine

## 2017-07-31 ENCOUNTER — Ambulatory Visit (INDEPENDENT_AMBULATORY_CARE_PROVIDER_SITE_OTHER): Payer: Medicare Other | Admitting: Internal Medicine

## 2017-07-31 ENCOUNTER — Encounter: Payer: Self-pay | Admitting: Internal Medicine

## 2017-07-31 VITALS — BP 138/86 | HR 95 | Ht 68.0 in | Wt 236.0 lb

## 2017-07-31 DIAGNOSIS — R079 Chest pain, unspecified: Secondary | ICD-10-CM

## 2017-07-31 DIAGNOSIS — I2510 Atherosclerotic heart disease of native coronary artery without angina pectoris: Secondary | ICD-10-CM

## 2017-07-31 DIAGNOSIS — I10 Essential (primary) hypertension: Secondary | ICD-10-CM | POA: Diagnosis not present

## 2017-07-31 NOTE — Patient Instructions (Addendum)
Your physician recommends that you continue on your current medications as directed. Please refer to the Current Medication list given to you today.  [facility] at xx:xx AM (30-45 minutes prior to test start time)  [facility] [address] [place], [place] [address] [phone]  Proceed to the [facility] Radiology Department (First Floor).  Please follow these instructions carefully (unless otherwise directed):  Hold all erectile dysfunction medications at least 48 hours prior to test.  On the Night Before the Test: . Drink plenty of water. . Do not consume any caffeinated/decaffeinated beverages or chocolate 12 hours prior to your test. . Do not take any antihistamines 12 hours prior to your test. . If you take Metformin do not take 24 hours prior to test.   On the Day of the Test: . Drink plenty of water. Do not drink any water within one hour of the test. . Do not eat any food 4 hours prior to the test. . You may take your regular medications prior to the test.   After the Test: . Drink plenty of water. . After receiving IV contrast, you may experience a mild flushed feeling. This is normal. . On occasion, you may experience a mild rash up to 24 hours after the test. This is not dangerous. If this occurs, you can take Benadryl 25 mg and increase your fluid intake. . If you experience trouble breathing, this can be serious. If it is severe call 911 IMMEDIATELY. If it is mild, please call our office. . If you take any of these medications: Glipizide/Metformin, Avandament, Glucavance, please do not take 48 hours after completing test.   Addendum: all AVS instructions mailed to patient.  He is aware he will be called when cardiac CT is scheduled.

## 2017-07-31 NOTE — Progress Notes (Signed)
Cardiology Office Note   Date:  07/31/2017   ID:  Daniel Perkins, DOB [birth year], MRN [id number]  PCP:  Shela Leff, MD  Cardiologist:   Dorris Carnes, MD   Pt presents for f/u of CAD     History of Present Illness: Daniel Perkins is a 62 y.o. male with a history of DM, HTN, CAD, statin intolerance    He underwent cardiac cath in 2011  This showed mild nonobstructive CAD  Echo showed normal LV systolic function and diastolic dysfunction  5374 stress myview was normal.  I saw im in 2016  He has been seen by Kathleen Argue since as well as Rod Can for PAD (Jan 2019)   SInce seen he says he will get a warm sensation in chest  Last 20 to 25 sec  SOme SOB for past month.   Occur several times per day    Current Outpatient Medications  Medication Sig Dispense Refill  . acetaminophen (TYLENOL) 500 MG tablet Take 1,000 mg by mouth every 6 (six) hours as needed for moderate pain (gout).    Marland Kitchen allopurinol (ZYLOPRIM) 100 MG tablet Take 1 tablet (100 mg total) by mouth daily. 90 tablet 0  . amLODipine (NORVASC) 5 MG tablet Take 1 tablet (5 mg total) by mouth daily. 90 tablet 0  . aspirin EC 81 MG tablet Take 1 tablet (81 mg total) by mouth daily. 30 tablet 3  . Blood Glucose Calibration (ONETOUCH VERIO) SOLN Use to check blood glucose as instructed. E11.9. 1 each 0  . Blood Glucose Monitoring Suppl (ONE TOUCH ULTRA 2) W/DEVICE KIT Check blood sugar twice daily as instructed dx code 250.00 1 each 0  . carvedilol (COREG) 12.5 MG tablet Take 1 tablet (12.5 mg total) by mouth 2 (two) times daily. 180 tablet 1  . cilostazol (PLETAL) 50 MG tablet Take 1 tablet (50 mg total) by mouth 2 (two) times daily. 180 tablet 3  . colchicine 0.6 MG tablet TAKE 1 TABLET BY MOUTH  DAILY AS NEEDED FOR GOUT  FLARE 30 tablet 0  . empagliflozin (JARDIANCE) 10 MG TABS tablet Take 10 mg by mouth daily. 90 tablet 1  . glipiZIDE (GLUCOTROL) 10 MG tablet TAKE 1 TABLET BY MOUTH  TWICE A DAY BEFORE A MEAL 180 tablet 0  .  glucose blood (ONETOUCH VERIO) test strip UsE 3 TIMES DAILY TO TEST BLOOD SUGAR. E11.9. INSULIN DEPENDENT 100 each 6  . Insulin Pen Needle 31G X 5 MM MISC Use to inject liraglutide once daily as instructed Dx E11.9 100 each 5  . LANCETS ULTRA THIN 30G MISC Use to check your blood sugar 3 times a day as instructed. 100 each 5  . liraglutide (VICTOZA) 18 MG/3ML SOPN Inject 1.8 mg into the skin daily as instructed 3 pen 1  . lisinopril (PRINIVIL,ZESTRIL) 20 MG tablet Take 1 tablet (20 mg total) by mouth daily. 90 tablet 0  . nitroGLYCERIN (NITROSTAT) 0.4 MG SL tablet Place 1 tablet (0.4 mg total) under the tongue every 5 (five) minutes as needed for chest pain. Do not take more than 3 tablets in 15 minutes. 30 tablet 2  . rosuvastatin (CRESTOR) 20 MG tablet Take 1 tablet (20 mg total) by mouth daily. 90 tablet 3   No current facility-administered medications for this visit.     Allergies:   Morphine and Pravastatin sodium   Past Medical History:  Diagnosis Date  . Club foot of both lower extremities 1958  . Coronary artery disease  Holter monitor 02/2012 - sinus with rare PVC  . CTEV (congenital talipes equinovarus)   . Gout   . Hyperlipidemia   . Hypertension   . Myocardial infarction [facility]) ~ 2007   "mild"  . Osteomyelitis ([place])   . Pneumonia 2000's   "couple times"  . Type II diabetes mellitus ([place]) 2011    Past Surgical History:  Procedure Laterality Date  . ANTERIOR CERVICAL DECOMP/DISCECTOMY FUSION  X 2  . CARDIAC CATHETERIZATION    . FOOT FRACTURE SURGERY Right 1990's   "pt a steel plate in"  . FOOT SURGERY Left x16   "joints collapsed; keep getting infected"  . POSTERIOR CERVICAL FUSION/FORAMINOTOMY  X 2     Social History:  The patient  reports that he quit smoking about 10 years ago. His smoking use included cigarettes. He has a 32.00 pack-year smoking history. He has never used smokeless tobacco. He reports that he drinks alcohol. He reports that he does not use  drugs.   Family History:  The patient's family history includes Coronary artery disease in his mother; Diabetes in his mother; Heart attack in his brother; Heart disease in his mother.    ROS:  Please see the history of present illness. All other systems are reviewed and  Negative to the above problem except as noted.    PHYSICAL EXAM: VS:  BP 138/86   Pulse 95   Ht '5\' 8"'  (1.727 m)   Wt 236 lb (107 kg)   SpO2 97%   BMI 35.88 kg/m   GEN: Morbidly obese 62 yo in no acute distress  HEENT: normal  Neck: no JVD, carotid bruits, or masses Cardiac: RRR; no murmurs, rubs, or gallops,no edema  Respiratory:  clear to auscultation bilaterally, normal work of breathing GI: soft, nontender, nondistended, + BS  No hepatomegaly  MS: no deformity Moving all extremities   Skin: warm and dry, no rash Neuro:  Strength and sensation are intact Psych: euthymic mood, full affect   EKG:  EKG is nor ordered today.   Lipid Panel    Component Value Date/Time   CHOL 162 07/04/2017 0822   TRIG 135 07/04/2017 0822   HDL 37 (L) 07/04/2017 0822   CHOLHDL 4.4 07/04/2017 0822   CHOLHDL 6.1 06/30/2014 1634   VLDL 45 (H) 06/30/2014 1634   LDLCALC 98 07/04/2017 0822      Wt Readings from Last 3 Encounters:  07/31/17 236 lb (107 kg)  07/11/17 236 lb 1.6 oz (107.1 kg)  06/27/17 237 lb 12.8 oz (107.9 kg)      ASSESSMENT AND PLAN:  1.  CP/SOB  Pt is a difficult historian   He has had these symptoms in past  On their own they are concerning     I would recomm coronary CT angiogram to evaluate    2.  HTn  BP is a littl high   Follow    3.  Lipids  Keep on current meds      Disposition:   FU with me in 8 wks    Signed, Dorris Carnes, MD  07/31/2017 10:47 PM    [address] [place], [place],   [address] Phone: [phone]; Fax: [phone]

## 2017-08-01 ENCOUNTER — Ambulatory Visit (INDEPENDENT_AMBULATORY_CARE_PROVIDER_SITE_OTHER): Payer: Medicare Other | Admitting: Podiatry

## 2017-08-01 ENCOUNTER — Encounter: Payer: Self-pay | Admitting: Podiatry

## 2017-08-01 DIAGNOSIS — M79675 Pain in left toe(s): Secondary | ICD-10-CM | POA: Diagnosis not present

## 2017-08-01 DIAGNOSIS — Q828 Other specified congenital malformations of skin: Secondary | ICD-10-CM | POA: Diagnosis not present

## 2017-08-01 DIAGNOSIS — E1151 Type 2 diabetes mellitus with diabetic peripheral angiopathy without gangrene: Secondary | ICD-10-CM | POA: Diagnosis not present

## 2017-08-01 DIAGNOSIS — B351 Tinea unguium: Secondary | ICD-10-CM | POA: Diagnosis not present

## 2017-08-01 DIAGNOSIS — M79674 Pain in right toe(s): Secondary | ICD-10-CM | POA: Diagnosis not present

## 2017-08-01 NOTE — Progress Notes (Signed)
Subjective:    Patient ID: Daniel Perkins, male    DOB: 26-Jul-1955, 62 y.o.   MRN: [id number]  HPI 62 year old male presents the office today for concerns of thick, painful, elongated toenails that he cannot trim himself as well as for corn on the right third toe which is tender with pressure in shoes.  He denies any redness or drainage coming from the callus or toenail sites.  He does state that he sees awaiting vascular for decreased circulation.  He is also been seen Dr. Sharol Given for his foot deformity as he has had numerous surgeries to his foot due to clubfoot as a child.  Denies any history of ulceration recently.  No other concerns.   Review of Systems  All other systems reviewed and are negative.  Past Medical History:  Diagnosis Date  . Club foot of both lower extremities 1958  . Coronary artery disease    Holter monitor 02/2012 - sinus with rare PVC  . CTEV (congenital talipes equinovarus)   . Gout   . Hyperlipidemia   . Hypertension   . Myocardial infarction [facility]) ~ 2007   "mild"  . Osteomyelitis ([place])   . Pneumonia 2000's   "couple times"  . Type II diabetes mellitus ([place]) 2011    Past Surgical History:  Procedure Laterality Date  . ANTERIOR CERVICAL DECOMP/DISCECTOMY FUSION  X 2  . CARDIAC CATHETERIZATION    . FOOT FRACTURE SURGERY Right 1990's   "pt a steel plate in"  . FOOT SURGERY Left x16   "joints collapsed; keep getting infected"  . POSTERIOR CERVICAL FUSION/FORAMINOTOMY  X 2     Current Outpatient Medications:  .  acetaminophen (TYLENOL) 500 MG tablet, Take 1,000 mg by mouth every 6 (six) hours as needed for moderate pain (gout)., Disp: , Rfl:  .  allopurinol (ZYLOPRIM) 100 MG tablet, Take 1 tablet (100 mg total) by mouth daily., Disp: 90 tablet, Rfl: 0 .  amLODipine (NORVASC) 5 MG tablet, Take 1 tablet (5 mg total) by mouth daily., Disp: 90 tablet, Rfl: 0 .  aspirin EC 81 MG tablet, Take 1 tablet (81 mg total) by mouth daily., Disp: 30 tablet, Rfl:  3 .  Blood Glucose Calibration (ONETOUCH VERIO) SOLN, Use to check blood glucose as instructed. E11.9., Disp: 1 each, Rfl: 0 .  Blood Glucose Monitoring Suppl (ONE TOUCH ULTRA 2) W/DEVICE KIT, Check blood sugar twice daily as instructed dx code 250.00, Disp: 1 each, Rfl: 0 .  carvedilol (COREG) 12.5 MG tablet, Take 1 tablet (12.5 mg total) by mouth 2 (two) times daily., Disp: 180 tablet, Rfl: 1 .  cilostazol (PLETAL) 50 MG tablet, Take 1 tablet (50 mg total) by mouth 2 (two) times daily., Disp: 180 tablet, Rfl: 3 .  colchicine 0.6 MG tablet, TAKE 1 TABLET BY MOUTH  DAILY AS NEEDED FOR GOUT  FLARE, Disp: 30 tablet, Rfl: 0 .  empagliflozin (JARDIANCE) 10 MG TABS tablet, Take 10 mg by mouth daily., Disp: 90 tablet, Rfl: 1 .  glipiZIDE (GLUCOTROL) 10 MG tablet, TAKE 1 TABLET BY MOUTH  TWICE A DAY BEFORE A MEAL, Disp: 180 tablet, Rfl: 0 .  glucose blood (ONETOUCH VERIO) test strip, UsE 3 TIMES DAILY TO TEST BLOOD SUGAR. E11.9. INSULIN DEPENDENT, Disp: 100 each, Rfl: 6 .  Insulin Pen Needle 31G X 5 MM MISC, Use to inject liraglutide once daily as instructed Dx E11.9, Disp: 100 each, Rfl: 5 .  LANCETS ULTRA THIN 30G MISC, Use to check your blood sugar  3 times a day as instructed., Disp: 100 each, Rfl: 5 .  liraglutide (VICTOZA) 18 MG/3ML SOPN, Inject 1.8 mg into the skin daily as instructed, Disp: 3 pen, Rfl: 1 .  lisinopril (PRINIVIL,ZESTRIL) 20 MG tablet, Take 1 tablet (20 mg total) by mouth daily., Disp: 90 tablet, Rfl: 0 .  nitroGLYCERIN (NITROSTAT) 0.4 MG SL tablet, Place 1 tablet (0.4 mg total) under the tongue every 5 (five) minutes as needed for chest pain. Do not take more than 3 tablets in 15 minutes., Disp: 30 tablet, Rfl: 2 .  rosuvastatin (CRESTOR) 20 MG tablet, Take 1 tablet (20 mg total) by mouth daily., Disp: 90 tablet, Rfl: 3  Allergies  Allergen Reactions  . Morphine Itching  . Pravastatin Sodium Other (See Comments)    Severe muscle cramps with one time requiring admission.      Social History   Socioeconomic History  . Marital status: Legally Separated    Spouse name: Not on file  . Number of children: Not on file  . Years of education: 67  . Highest education level: Not on file  Occupational History    Employer: UNEMPLOYED  . Occupation: unemployed  Social Needs  . Financial resource strain: Not on file  . Food insecurity:    Worry: Not on file    Inability: Not on file  . Transportation needs:    Medical: Not on file    Non-medical: Not on file  Tobacco Use  . Smoking status: Former Smoker    Packs/day: 1.00    Years: 32.00    Pack years: 32.00    Types: Cigarettes    Last attempt to quit: 07/06/2007    Years since quitting: 10.0  . Smokeless tobacco: Never Used  Substance and Sexual Activity  . Alcohol use: Yes    Alcohol/week: 0.0 oz    Comment: Sometimes.  . Drug use: No  . Sexual activity: Not on file  Lifestyle  . Physical activity:    Days per week: Not on file    Minutes per session: Not on file  . Stress: Not on file  Relationships  . Social connections:    Talks on phone: Not on file    Gets together: Not on file    Attends religious service: Not on file    Active member of club or organization: Not on file    Attends meetings of clubs or organizations: Not on file    Relationship status: Not on file  . Intimate partner violence:    Fear of current or ex partner: Not on file    Emotionally abused: Not on file    Physically abused: Not on file    Forced sexual activity: Not on file  Other Topics Concern  . Not on file  Social History Narrative  . Not on file        Objective:   Physical Exam General: AAO x3, NAD  Dermatological: Nails are hypertrophic, dystrophic, brittle, discolored, elongated 10. No surrounding redness or drainage. Tenderness nails 1-5 bilaterally.  Hyperkeratotic lesion right third toe.  Upon debridement no underlying ulceration drainage or any signs of infection.  No open lesions or  pre-ulcerative lesions are identified today.   Vascular: Dorsalis Pedis artery and Posterior Tibial artery pedal pulses are decreased with immedate capillary fill time.  There is no pain with calf compression, swelling, warmth, erythema.   Neruologic: Sensation decreased with Derrel Nip monofilament  Musculoskeletal: Bilateral deformity due to clubfoot.  No area of  tenderness identified today other than the toenails and calluses.  Muscular strength 5/5 in all groups tested bilateral.      Assessment & Plan:  62 year old male with symptomatic onychomycosis, hyperkeratotic lesions diabetes/PAD -Treatment options discussed including all alternatives, risks, and complications -Etiology of symptoms were discussed -Nails debrided 10 without complications or bleeding. -Hyperkeratotic lesion Sharpy debrided x1 without any complications or bleeding -Daily foot inspection -Follow-up in 3 months or sooner if any problems arise. In the meantime, encouraged to call the office with any questions, concerns, change in symptoms.   Celesta Gentile, DPM

## 2017-08-07 ENCOUNTER — Other Ambulatory Visit: Payer: Self-pay | Admitting: Internal Medicine

## 2017-08-07 DIAGNOSIS — M10042 Idiopathic gout, left hand: Secondary | ICD-10-CM

## 2017-08-15 ENCOUNTER — Ambulatory Visit (INDEPENDENT_AMBULATORY_CARE_PROVIDER_SITE_OTHER): Payer: Medicare Other | Admitting: Cardiovascular Disease

## 2017-08-15 ENCOUNTER — Encounter: Payer: Self-pay | Admitting: Cardiovascular Disease

## 2017-08-15 VITALS — BP 136/78 | HR 96 | Ht 68.0 in | Wt 235.0 lb

## 2017-08-15 DIAGNOSIS — E785 Hyperlipidemia, unspecified: Secondary | ICD-10-CM | POA: Diagnosis not present

## 2017-08-15 DIAGNOSIS — I10 Essential (primary) hypertension: Secondary | ICD-10-CM

## 2017-08-15 DIAGNOSIS — I739 Peripheral vascular disease, unspecified: Secondary | ICD-10-CM

## 2017-08-15 NOTE — Progress Notes (Signed)
Cardiology Office Note   Date:  08/15/2017   ID:  De Blanch, DOB 03-Jul-1955, MRN [id number]  PCP:  Shela Leff, MD  Cardiologist:  Dr. Harrington Challenger.   No chief complaint on file.     History of Present Illness: Daniel Perkins is a 62 y.o. male who is here today for a follow up visit regarding peripheral arterial disease. He has history of minimal coronary plaque by previous cardiac catheterization, diabetes, hypertension and hyperlipidemia.  He quit smoking in 2010.  He was seen recently for atypical chest pain.  He underwent a nuclear stress test which showed no evidence of ischemia with normal ejection fraction. He had a screening ABI that was abnormal.  He underwent lower extremity arterial Doppler which showed normal ABI on the left side and mildly to moderately reduced on the right side at 0.78.  Duplex showed occluded mid SFA in a short segment with one vessel runoff below the knee via the peroneal artery. He has known history of clubfoot of both lower extremities which limits his physical activities.  He reports having multiple foot surgeries in the past. His claudication was atypical and thus was treated medically. During last visit , I added Cilostazol and rosuvastatin.  He reports stable bilateral calf pain with walking but he is mostly bothered by right thigh discomfort.  He has been taking medications regularly with no reported side effects.  Past Medical History:  Diagnosis Date  . Club foot of both lower extremities 1958  . Coronary artery disease    Holter monitor 02/2012 - sinus with rare PVC  . CTEV (congenital talipes equinovarus)   . Gout   . Hyperlipidemia   . Hypertension   . Myocardial infarction [facility]) ~ 2007   "mild"  . Osteomyelitis ([place])   . Pneumonia 2000's   "couple times"  . Type II diabetes mellitus ([place]) 2011    Past Surgical History:  Procedure Laterality Date  . ANTERIOR CERVICAL DECOMP/DISCECTOMY FUSION  X 2  . CARDIAC  CATHETERIZATION    . FOOT FRACTURE SURGERY Right 1990's   "pt a steel plate in"  . FOOT SURGERY Left x16   "joints collapsed; keep getting infected"  . POSTERIOR CERVICAL FUSION/FORAMINOTOMY  X 2     Current Outpatient Medications  Medication Sig Dispense Refill  . acetaminophen (TYLENOL) 500 MG tablet Take 1,000 mg by mouth every 6 (six) hours as needed for moderate pain (gout).    Marland Kitchen allopurinol (ZYLOPRIM) 100 MG tablet TAKE 1 TABLET BY MOUTH  DAILY 90 tablet 0  . amLODipine (NORVASC) 5 MG tablet Take 1 tablet (5 mg total) by mouth daily. 90 tablet 0  . aspirin EC 81 MG tablet Take 1 tablet (81 mg total) by mouth daily. 30 tablet 3  . Blood Glucose Calibration (ONETOUCH VERIO) SOLN Use to check blood glucose as instructed. E11.9. 1 each 0  . Blood Glucose Monitoring Suppl (ONE TOUCH ULTRA 2) W/DEVICE KIT Check blood sugar twice daily as instructed dx code 250.00 1 each 0  . carvedilol (COREG) 12.5 MG tablet Take 1 tablet (12.5 mg total) by mouth 2 (two) times daily. 180 tablet 1  . cilostazol (PLETAL) 50 MG tablet Take 1 tablet (50 mg total) by mouth 2 (two) times daily. 180 tablet 3  . colchicine 0.6 MG tablet TAKE 1 TABLET BY MOUTH  DAILY AS NEEDED FOR GOUT  FLARE 30 tablet 0  . empagliflozin (JARDIANCE) 10 MG TABS tablet Take 10 mg by mouth daily. [place]  tablet 1  . glipiZIDE (GLUCOTROL) 10 MG tablet TAKE 1 TABLET BY MOUTH  TWICE A DAY BEFORE A MEAL 180 tablet 0  . glucose blood (ONETOUCH VERIO) test strip UsE 3 TIMES DAILY TO TEST BLOOD SUGAR. E11.9. INSULIN DEPENDENT 100 each 6  . Insulin Pen Needle 31G X 5 MM MISC Use to inject liraglutide once daily as instructed Dx E11.9 100 each 5  . LANCETS ULTRA THIN 30G MISC Use to check your blood sugar 3 times a day as instructed. 100 each 5  . liraglutide (VICTOZA) 18 MG/3ML SOPN Inject 1.8 mg into the skin daily as instructed 3 pen 1  . lisinopril (PRINIVIL,ZESTRIL) 20 MG tablet Take 1 tablet (20 mg total) by mouth daily. 90 tablet 0  .  nitroGLYCERIN (NITROSTAT) 0.4 MG SL tablet Place 1 tablet (0.4 mg total) under the tongue every 5 (five) minutes as needed for chest pain. Do not take more than 3 tablets in 15 minutes. 30 tablet 2  . rosuvastatin (CRESTOR) 20 MG tablet Take 1 tablet (20 mg total) by mouth daily. 90 tablet 3   No current facility-administered medications for this visit.     Allergies:   Morphine and Pravastatin sodium    Social History:  The patient  reports that he quit smoking about 10 years ago. His smoking use included cigarettes. He has a 32.00 pack-year smoking history. He has never used smokeless tobacco. He reports that he drinks alcohol. He reports that he does not use drugs.   Family History:  The patient's family history includes Coronary artery disease in his mother; Diabetes in his mother; Heart attack in his brother; Heart disease in his mother.    ROS:  Please see the history of present illness.   Otherwise, review of systems are positive for none.   All other systems are reviewed and negative.    PHYSICAL EXAM: VS:  BP 136/78   Pulse 96   Ht '5\' 8"'  (1.727 m)   Wt 235 lb (106.6 kg)   SpO2 95%   BMI 35.73 kg/m  , BMI Body mass index is 35.73 kg/m. GEN: Well nourished, well developed, in no acute distress  HEENT: normal  Neck: no JVD, carotid bruits, or masses Cardiac: RRR; no rubs, or gallops,no edema .  2 out of 6 systolic ejection murmur in the aortic area. Respiratory:  clear to auscultation bilaterally, normal work of breathing GI: soft, nontender, nondistended, + BS MS: no deformity or atrophy  Skin: warm and dry, no rash Neuro:  Strength and sensation are intact Psych: euthymic mood, full affect Vascular: Femoral pulses normal bilaterally.  Distal pulses are not palpable on the right and faint on the left side.   EKG:  EKG is not ordered today.    Recent Labs: 02/14/2017: BUN 15; Creatinine, Ser 1.09; Potassium 5.1; Sodium 136 07/04/2017: ALT 16    Lipid Panel      Component Value Date/Time   CHOL 162 07/04/2017 0822   TRIG 135 07/04/2017 0822   HDL 37 (L) 07/04/2017 0822   CHOLHDL 4.4 07/04/2017 0822   CHOLHDL 6.1 06/30/2014 1634   VLDL 45 (H) 06/30/2014 1634   LDLCALC 98 07/04/2017 0822      Wt Readings from Last 3 Encounters:  08/15/17 235 lb (106.6 kg)  07/31/17 236 lb (107 kg)  07/11/17 236 lb 1.6 oz (107.1 kg)      No flowsheet data found.    ASSESSMENT AND PLAN:  1.  Peripheral arterial disease:  Atypical bilateral leg claudication.  Symptoms are overall mild and not lifestyle limiting.  I recommend continuing medical therapy.  I encouraged him to increase his walking.  2.  Essential hypertension: Blood pressure is controlled on current medications.  3.  Hyperlipidemia:  He seems to be tolerating rosuvastatin with significant improvement in lipid profile.  4.  Exertional dyspnea: The patient was seen by Dr. Harrington Challenger and he is supposed to get CTA of the coronary arteries in the near future.   Disposition:   FU with me in 6 months  Signed,  Kathlyn Sacramento, MD  08/15/2017 8:23 AM    [place]

## 2017-08-15 NOTE — Patient Instructions (Signed)

## 2017-08-31 ENCOUNTER — Encounter: Payer: Self-pay | Admitting: *Deleted

## 2017-08-31 ENCOUNTER — Telehealth: Payer: Self-pay | Admitting: *Deleted

## 2017-08-31 DIAGNOSIS — R079 Chest pain, unspecified: Secondary | ICD-10-CM

## 2017-08-31 NOTE — Progress Notes (Signed)
Please arrive at the North Tower main entrance of [facility] at xx:xx AM (30-45 minutes prior to test start time)  [facility] [address] [place], [place] [address] [phone]  Proceed to the [place] Radiology Department (First Floor).  Please follow these instructions carefully (unless otherwise directed):  Hold all erectile dysfunction medications at least 48 hours prior to test.  On the Night Before the Test: . Drink plenty of water. . Do not consume any caffeinated/decaffeinated beverages or chocolate 12 hours prior to your test. . Do not take any antihistamines 12 hours prior to your test. . If you take Metformin do not take 24 hours prior to test. . If the patient has contrast allergy: ? Patient will need a prescription for Prednisone and very clear instructions (as follows): 1. Prednisone 50 mg - take 13 hours prior to test 2. Take another Prednisone 50 mg 7 hours prior to test 3. Take another Prednisone 50 mg 1 hour prior to test 4. Take Benadryl 50 mg 1 hour prior to test . Patient must complete all four doses of above prophylactic medications. . Patient will need a ride after test due to Benadryl.  On the Day of the Test: . Drink plenty of water. Do not drink any water within one hour of the test. . Do not eat any food 4 hours prior to the test. . You may take your regular medications prior to the test. . IF NOT ON A BETA BLOCKER - Take 50 mg of lopressor (metoprolol) one hour before the test. . HOLD Furosemide morning of the test.  After the Test: . Drink plenty of water. . After receiving IV contrast, you may experience a mild flushed feeling. This is normal. . On occasion, you may experience a mild rash up to 24 hours after the test. This is not dangerous. If this occurs, you can take Benadryl 25 mg and increase your fluid intake. . If you experience trouble breathing, this can be serious. If it is severe call 911 IMMEDIATELY. If it  is mild, please call our office. . If you take any of these medications: Glipizide/Metformin, Avandament, Glucavance, please do not take 48 hours after completing test.  

## 2017-08-31 NOTE — Telephone Encounter (Signed)
Per referral message:  Pt scheduled for 09/18/17 for cardiac CT, labs 5/7.  Will pick up instruction letter at that time.

## 2017-09-12 ENCOUNTER — Other Ambulatory Visit: Payer: Medicare Other

## 2017-09-12 DIAGNOSIS — R079 Chest pain, unspecified: Secondary | ICD-10-CM | POA: Diagnosis not present

## 2017-09-12 LAB — BASIC METABOLIC PANEL
BUN / CREAT RATIO: 14 (ref 10–24)
BUN: 18 mg/dL (ref 8–27)
CHLORIDE: 104 mmol/L (ref 96–106)
CO2: 19 mmol/L — ABNORMAL LOW (ref 20–29)
Calcium: 9.1 mg/dL (ref 8.6–10.2)
Creatinine, Ser: 1.25 mg/dL (ref 0.76–1.27)
GFR calc non Af Amer: 62 mL/min/{1.73_m2} (ref >59)
GFR, EST AFRICAN AMERICAN: 72 mL/min/{1.73_m2} (ref >59)
GLUCOSE: 153 mg/dL — AB (ref 65–99)
Potassium: 4.7 mmol/L (ref 3.5–5.2)
SODIUM: 139 mmol/L (ref 134–144)

## 2017-09-18 ENCOUNTER — Ambulatory Visit (HOSPITAL_COMMUNITY): Payer: Medicare Other

## 2017-09-19 ENCOUNTER — Other Ambulatory Visit: Payer: Self-pay | Admitting: *Deleted

## 2017-09-19 DIAGNOSIS — E119 Type 2 diabetes mellitus without complications: Secondary | ICD-10-CM

## 2017-09-19 MED ORDER — GLIPIZIDE 10 MG PO TABS: ORAL_TABLET | ORAL | 0 refills | Status: DC

## 2017-09-20 ENCOUNTER — Encounter (HOSPITAL_COMMUNITY): Payer: Self-pay

## 2017-09-20 ENCOUNTER — Ambulatory Visit (HOSPITAL_COMMUNITY)
Admission: RE | Admit: 2017-09-20 | Discharge: 2017-09-20 | Disposition: A | Discharge status: Home / Self Care | Payer: Medicare Other | Source: Ambulatory Visit | Attending: Internal Medicine | Admitting: Internal Medicine

## 2017-09-20 ENCOUNTER — Ambulatory Visit (HOSPITAL_COMMUNITY): Payer: Medicare Other

## 2017-09-20 DIAGNOSIS — I700 Atherosclerosis of aorta: Secondary | ICD-10-CM | POA: Diagnosis not present

## 2017-09-20 DIAGNOSIS — R079 Chest pain, unspecified: Secondary | ICD-10-CM

## 2017-09-20 MED ORDER — METOPROLOL TARTRATE 5 MG/5ML IV SOLN
INTRAVENOUS | Status: AC
  Filled 2017-09-20: qty 5

## 2017-09-20 MED ORDER — METOPROLOL TARTRATE 5 MG/5ML IV SOLN: 5.0000 mg | INTRAVENOUS | Status: DC | PRN

## 2017-09-20 MED ORDER — METOPROLOL TARTRATE 5 MG/5ML IV SOLN
INTRAVENOUS | Status: AC
  Administered 2017-09-20: 5 mg
  Filled 2017-09-20: qty 25

## 2017-09-20 MED ORDER — IOPAMIDOL (ISOVUE-370) INJECTION 76%
INTRAVENOUS | Status: AC
  Filled 2017-09-20: qty 100

## 2017-09-20 MED ORDER — METOPROLOL TARTRATE 5 MG/5ML IV SOLN
10.0000 mg | Freq: Once | INTRAVENOUS | Status: AC
  Administered 2017-09-20: 10 mg via INTRAVENOUS

## 2017-09-20 MED ORDER — METOPROLOL TARTRATE 5 MG/5ML IV SOLN: 5.0000 mg | Freq: Once | INTRAVENOUS | Status: DC

## 2017-09-20 MED ORDER — IOPAMIDOL (ISOVUE-370) INJECTION 76%
100.0000 mL | Freq: Once | INTRAVENOUS | Status: AC | PRN
  Administered 2017-09-20: 100 mL via INTRAVENOUS

## 2017-09-20 MED ORDER — NITROGLYCERIN 0.4 MG SL SUBL
SUBLINGUAL_TABLET | SUBLINGUAL | Status: AC
  Filled 2017-09-20: qty 2

## 2017-09-20 MED ORDER — NITROGLYCERIN 0.4 MG SL SUBL
0.8000 mg | SUBLINGUAL_TABLET | SUBLINGUAL | Status: DC | PRN
  Administered 2017-09-20: 0.8 mg via SUBLINGUAL

## 2017-09-21 ENCOUNTER — Other Ambulatory Visit: Payer: Self-pay | Admitting: *Deleted

## 2017-09-21 DIAGNOSIS — I25118 Atherosclerotic heart disease of native coronary artery with other forms of angina pectoris: Secondary | ICD-10-CM

## 2017-09-21 MED ORDER — NITROGLYCERIN 0.4 MG SL SUBL: 0.4000 mg | SUBLINGUAL_TABLET | SUBLINGUAL | 0 refills | Status: DC | PRN

## 2017-09-25 ENCOUNTER — Telehealth: Payer: Self-pay | Admitting: Internal Medicine

## 2017-09-25 NOTE — Telephone Encounter (Signed)
Informed pt of results. Pt verbalized understanding. 

## 2017-09-25 NOTE — Telephone Encounter (Signed)
Follow Up:    Returning Michalene's call from Friday,concerning his test results/

## 2017-10-03 ENCOUNTER — Other Ambulatory Visit: Payer: Self-pay | Admitting: Pharmacist

## 2017-10-03 DIAGNOSIS — E119 Type 2 diabetes mellitus without complications: Secondary | ICD-10-CM

## 2017-10-03 DIAGNOSIS — I25118 Atherosclerotic heart disease of native coronary artery with other forms of angina pectoris: Secondary | ICD-10-CM

## 2017-10-06 MED ORDER — LIRAGLUTIDE 18 MG/3ML ~~LOC~~ SOPN: PEN_INJECTOR | SUBCUTANEOUS | 11 refills | Status: DC

## 2017-10-06 MED ORDER — NITROGLYCERIN 0.4 MG SL SUBL: 0.4000 mg | SUBLINGUAL_TABLET | SUBLINGUAL | 0 refills | Status: DC | PRN

## 2017-10-17 ENCOUNTER — Encounter: Payer: Self-pay | Admitting: Internal Medicine

## 2017-10-17 ENCOUNTER — Telehealth: Payer: Self-pay | Admitting: Internal Medicine

## 2017-10-17 ENCOUNTER — Ambulatory Visit (INDEPENDENT_AMBULATORY_CARE_PROVIDER_SITE_OTHER): Payer: Medicare Other | Admitting: Internal Medicine

## 2017-10-17 ENCOUNTER — Encounter (INDEPENDENT_AMBULATORY_CARE_PROVIDER_SITE_OTHER): Payer: Self-pay

## 2017-10-17 VITALS — BP 164/92 | HR 89 | Temp 98.1°F | Ht 68.0 in | Wt 237.6 lb

## 2017-10-17 DIAGNOSIS — E119 Type 2 diabetes mellitus without complications: Secondary | ICD-10-CM

## 2017-10-17 DIAGNOSIS — R0602 Shortness of breath: Secondary | ICD-10-CM

## 2017-10-17 DIAGNOSIS — Z7984 Long term (current) use of oral hypoglycemic drugs: Secondary | ICD-10-CM | POA: Diagnosis not present

## 2017-10-17 LAB — POCT GLYCOSYLATED HEMOGLOBIN (HGB A1C): Hemoglobin A1C: 7.5 % — AB (ref 4.0–5.6)

## 2017-10-17 LAB — GLUCOSE, CAPILLARY: Glucose-Capillary: 96 mg/dL (ref 65–99)

## 2017-10-17 NOTE — Telephone Encounter (Signed)
Left message with patient's father for patient to call back

## 2017-10-17 NOTE — Progress Notes (Signed)
   CC: Diabetes follow-up  HPI:  [name] is a 62 y.o. male with a past medical history of conditions listed below presenting to the clinic for a follow-up of diabetes.  Shortness of breath was also discussed. Please see problem based charting for the status of the patient's current and chronic medical conditions.   Past Medical History:  Diagnosis Date  . Club foot of both lower extremities 1958  . Coronary artery disease    Holter monitor 02/2012 - sinus with rare PVC  . CTEV (congenital talipes equinovarus)   . Gout   . Hyperlipidemia   . Hypertension   . Myocardial infarction [facility](HCC) ~ 2007   "mild"  . Osteomyelitis (HCC)   . Pneumonia 2000's   "couple times"  . Type II diabetes mellitus (HCC) 2011   Review of Systems: Pertinent positives mentioned in HPI. Remainder of all ROS negative.   Physical Exam:  Vitals:   10/17/17 1521  BP: (!) 164/92  Pulse: 89  Temp: 98.1 F (36.7 C)  TempSrc: Oral  SpO2: 99%  Weight: 237 lb 9.6 oz (107.8 kg)  Height: 5\' 8"  (1.727 m)   Physical Exam  Constitutional: He is oriented to person, place, and time. He appears well-developed and well-nourished. No distress.  HENT:  Mouth/Throat: Oropharynx is clear and moist.  Eyes: Right eye exhibits no discharge. Left eye exhibits no discharge.  Cardiovascular: Normal rate, regular rhythm and intact distal pulses. Exam reveals no gallop and no friction rub.  No murmur heard. Pulmonary/Chest: Effort normal and breath sounds normal. No respiratory distress. He has no wheezes.  Breathing comfortably on room air.  No accessory muscle use.  Mild bibasilar rales.  Abdominal: Soft. Bowel sounds are normal. He exhibits no distension. There is no tenderness.  Musculoskeletal: He exhibits no edema.  Neurological: He is alert and oriented to person, place, and time.  Skin: Skin is warm and dry.    Assessment & Plan:   See Encounters Tab for problem based charting.  Patient discussed  with Dr. Oswaldo DoneVincent

## 2017-10-17 NOTE — Assessment & Plan Note (Addendum)
Patient continues to complain of shortness of breath with minimal exertion at this visit.  States he feels severely short of breath even after climbing 10 steps or while ambulating at home. Shortness of breath is associated with a warm feeling rushing across his chest and lightheadedness.  Symptoms typically last 20 to 25 seconds and resolve with rest.  He is being followed by cardiology and work-up done so far has been negative.  No arrhythmia on recent EKGs.  Cardiac cath done March 2011 showing nonobstructive CAD with 20% - 30% obtuse marginal. Nuclear stress test done December 2018 was low risk with EF 61%. Coronary CTA done March 2018 with low calcium score and normal coronary arteries. On exam, I appreciated mild bibasilar rales.  He was breathing comfortably on room air with no accessory muscle use.  Does state that he was previously smoking 1/2 pack/day of cigarettes for 10 years; quit 9-1/2 years ago.  PFTs done in 2011 were normal.  Continues to endorse sleep apnea-like symptoms and sleep study which was previously ordered has not been done yet.  Plan -CBC to rule out anemia -Repeat PFTs -Sleep study pending  Addendum: Hgb 13.4. Although MCV continues to be low at 71. Ferritin checked in 06/2017 was normal. Findings not suggestive of IDA. Low MCV could possibly be due to underlying thalassemia.

## 2017-10-17 NOTE — Assessment & Plan Note (Signed)
Lab Results  Component Value Date   HGBA1C 7.5 (A) 10/17/2017   HGBA1C 8.2 06/27/2017   HGBA1C 11.0 02/14/2017     Assessment: Diabetes control:  Well-controlled Progress toward A1C goal:   Improved Comments: Current medication regimen includes Victoza 1.8 mg daily, glipizide 10 mg twice daily, and empagliflozin 10 mg daily.  Patient reports compliance with medications.  He does endorse dietary indiscretions including eating dinner twice at night as late as 10 PM.  In addition, he has not been exercising and has not lost weight.  Blood glucose meter showing average 144.  No low CBGs.  Plan: Medications:  continue current medications Instruction/counseling given: reminded to get eye exam, reminded to bring blood glucose meter & log to each visit, reminded to bring medications to each visit, discussed foot care, discussed the need for weight loss, discussed diet and discussed sick day management Other plans: Referral to Lupita LeashDonna for advice on diet and nutrition.  Repeat A1c in 3 months.

## 2017-10-17 NOTE — Patient Instructions (Signed)
[name] it was nice seeing you today.   For diabetes:  -Continue taking Victoza, glipizide, and empagliflozin as instructed.  No medication changes have been made at this visit.  -I would like to encourage you to eat healthy and exercise.  -You have also been referred to Lupita LeashDonna for advice on diet and nutrition.   For shortness of breath:  -I am checking your blood count today.  -Pulmonary function test has been ordered.   Return for a follow-up in 3 months.

## 2017-10-17 NOTE — Telephone Encounter (Signed)
New Message    Patient is calling in reference to CT results. Please call.

## 2017-10-18 LAB — CBC
Hematocrit: 42.9 % (ref 37.5–51.0)
Hemoglobin: 13.4 g/dL (ref 13.0–17.7)
MCH: 22.2 pg — ABNORMAL LOW (ref 26.6–33.0)
MCHC: 31.2 g/dL — ABNORMAL LOW (ref 31.5–35.7)
MCV: 71 fL — ABNORMAL LOW (ref 79–97)
Platelets: 305 10*3/uL (ref 150–450)
RBC: 6.04 x10E6/uL — ABNORMAL HIGH (ref 4.14–5.80)
RDW: 15.9 % — ABNORMAL HIGH (ref 12.3–15.4)
WBC: 9.2 10*3/uL (ref 3.4–10.8)

## 2017-10-18 NOTE — Progress Notes (Signed)
Internal Medicine Clinic Attending  Case discussed with Dr. Rathoreat the time of the visit. We reviewed the resident's history and exam and pertinent patient test results. I agree with the assessment, diagnosis, and plan of care documented in the resident's note.  

## 2017-10-20 ENCOUNTER — Other Ambulatory Visit: Payer: Self-pay

## 2017-10-20 NOTE — Patient Outreach (Signed)
[facility]) Care Management  10/20/2017  Daniel Perkins 05/13/1955 [id number]   Medication Adherence call to [name] left a message with his father patient is past due  on Rosuvastatin 20 mg under [facility].  Lillia AbedAna Ollison-Moran CPhT Pharmacy Technician [facility] Care Management Direct Dial [phone]  Fax [phone] [email or web address]

## 2017-10-23 NOTE — Telephone Encounter (Signed)
Left message with patient's father. Advised to ask patient to call back if there is anything that is needed.  CT results were reviewed with patient on 09/25/17.

## 2017-10-26 ENCOUNTER — Ambulatory Visit (HOSPITAL_COMMUNITY)
Admission: RE | Admit: 2017-10-26 | Discharge: 2017-10-26 | Disposition: A | Discharge status: Home / Self Care | Payer: Medicare Other | Source: Ambulatory Visit | Attending: Internal Medicine | Admitting: Internal Medicine

## 2017-10-26 DIAGNOSIS — R0602 Shortness of breath: Secondary | ICD-10-CM | POA: Insufficient documentation

## 2017-10-26 LAB — PULMONARY FUNCTION TEST
DL/VA % pred: 83 %
DL/VA: 3.68 ml/min/mmHg/L
DLCO cor % pred: 52 %
DLCO cor: 14.93 ml/min/mmHg
DLCO unc % pred: 50 %
DLCO unc: 14.4 ml/min/mmHg
FEF 25-75 Post: 3.68 L/sec
FEF 25-75 Pre: 3.31 L/sec
FEF2575-%Change-Post: 11 %
FEF2575-%Pred-Post: 142 %
FEF2575-%Pred-Pre: 127 %
FEV1-%Change-Post: 0 %
FEV1-%Pred-Post: 97 %
FEV1-%Pred-Pre: 96 %
FEV1-Post: 2.68 L
FEV1-Pre: 2.66 L
FEV1FVC-%Change-Post: 2 %
FEV1FVC-%Pred-Pre: 107 %
FEV6-%Change-Post: -2 %
FEV6-%Pred-Post: 91 %
FEV6-%Pred-Pre: 93 %
FEV6-Post: 3.11 L
FEV6-Pre: 3.18 L
FEV6FVC-%Change-Post: 0 %
FEV6FVC-%Pred-Post: 104 %
FEV6FVC-%Pred-Pre: 103 %
FVC-%Change-Post: -1 %
FVC-%Pred-Post: 87 %
FVC-%Pred-Pre: 89 %
FVC-Post: 3.13 L
FVC-Pre: 3.18 L
Post FEV1/FVC ratio: 86 %
Post FEV6/FVC ratio: 100 %
Pre FEV1/FVC ratio: 84 %
Pre FEV6/FVC Ratio: 100 %
RV % pred: 94 %
RV: 1.98 L
TLC % pred: 82 %
TLC: 5.3 L

## 2017-10-26 MED ORDER — ALBUTEROL SULFATE (2.5 MG/3ML) 0.083% IN NEBU
2.5000 mg | INHALATION_SOLUTION | Freq: Once | RESPIRATORY_TRACT | Status: AC
  Administered 2017-10-26: 2.5 mg via RESPIRATORY_TRACT

## 2017-10-31 ENCOUNTER — Encounter: Payer: Self-pay | Admitting: Internal Medicine

## 2017-10-31 NOTE — Addendum Note (Signed)
Addended by: John GiovanniATHORE, Khye Hochstetler on: 10/31/2017 05:29 PM   Modules accepted: Orders

## 2017-11-01 ENCOUNTER — Telehealth: Payer: Self-pay | Admitting: *Deleted

## 2017-11-01 ENCOUNTER — Encounter: Payer: Self-pay | Admitting: *Deleted

## 2017-11-01 NOTE — Telephone Encounter (Signed)
Call to patient per request of Dr. Loney Lohathore about PFT results.  Results of testing inconclusive and patient will need further testing.  Dr. Stefano Gaulaahore would like to refer patient to a Pulmonary doctor for further testing if needed.  Patient was informed of and will await appointment for Pulmonary.  Patient voiced understanding of the plan.  Venita SheffieldGladys Hamlin Devine,RN 11/01/2017 9:18 AM.

## 2017-11-02 ENCOUNTER — Ambulatory Visit: Payer: Self-pay | Admitting: Podiatry

## 2017-11-06 ENCOUNTER — Other Ambulatory Visit: Payer: Self-pay | Admitting: Internal Medicine

## 2017-11-06 DIAGNOSIS — M10042 Idiopathic gout, left hand: Secondary | ICD-10-CM

## 2017-11-06 DIAGNOSIS — I10 Essential (primary) hypertension: Secondary | ICD-10-CM

## 2017-11-10 ENCOUNTER — Telehealth: Payer: Self-pay | Admitting: Internal Medicine

## 2017-11-13 ENCOUNTER — Other Ambulatory Visit: Payer: Self-pay

## 2017-11-13 NOTE — Patient Outreach (Signed)
[facility]) Care Management  11/13/2017  Runell GessGarry Lisby 04/16/1956 [id number]   Medication Adherence call to Mrs. Benjaman KindlerGary Hlavacek spoke with patient dad he ask if we can call the patient back tomorrow around the same time. Mr. Gale JourneyWestmoreland is past due on Rosuvastatin 20 mg.patient is showing past due under [employer].  Lillia AbedAna Ollison-Moran CPhT Pharmacy Technician [facility] Care [facility] Direct Dial [phone]  Fax [phone] [email or web address]

## 2017-11-15 ENCOUNTER — Other Ambulatory Visit: Payer: Self-pay | Admitting: Dietician

## 2017-11-15 ENCOUNTER — Ambulatory Visit: Payer: Medicare Other | Admitting: Dietician

## 2017-11-15 DIAGNOSIS — E119 Type 2 diabetes mellitus without complications: Secondary | ICD-10-CM

## 2017-11-15 MED ORDER — GLUCOSE BLOOD VI STRP: ORAL_STRIP | 5 refills | Status: DC

## 2017-11-15 MED ORDER — ONETOUCH DELICA LANCETS FINE MISC: 12 refills | Status: DC

## 2017-11-15 MED ORDER — INSULIN PEN NEEDLE 32G X 4 MM MISC: 3 refills | Status: AC

## 2017-11-15 NOTE — Telephone Encounter (Signed)
Request refills on patient's behalf. He is out of strips to check his blood sugar and prefers they be delivered to his house

## 2017-11-15 NOTE — Progress Notes (Signed)
This was a very brief visit with Daniel Perkins because he had a conflicting appointment and requested to reschedule. Hr requested refills on test strips,  lancets, pen needles BHe feels his blood sugars are under good control and desires help with his weight and general diabetes self care. He says he was drinking sugared tea and has been back on diet green tea for a week now. He rescheduled His appointment for Monday, 7/15 at 315 PM.

## 2017-11-20 ENCOUNTER — Ambulatory Visit: Payer: Medicare Other | Admitting: Dietician

## 2017-11-20 ENCOUNTER — Encounter: Payer: Self-pay | Admitting: Internal Medicine

## 2017-11-24 ENCOUNTER — Other Ambulatory Visit: Payer: Self-pay | Admitting: Internal Medicine

## 2017-11-24 DIAGNOSIS — I25118 Atherosclerotic heart disease of native coronary artery with other forms of angina pectoris: Secondary | ICD-10-CM

## 2017-12-01 ENCOUNTER — Institutional Professional Consult (permissible substitution): Payer: Medicare Other | Admitting: Pulmonary Disease

## 2017-12-01 NOTE — Progress Notes (Deleted)
**Note Daniel-Identified via Obfuscation** Synopsis: Referred in July 2019 for ***  Subjective:   PATIENT ID: Daniel Perkins GENDER: male DOB: 03-16-1956, MRN: [id number]   HPI  No chief complaint on file.   ***  Past Medical History:  Diagnosis Date  . Club foot of both lower extremities 1958  . Coronary artery disease    Holter monitor 02/2012 - sinus with rare PVC  . CTEV (congenital talipes equinovarus)   . Gout   . Hyperlipidemia   . Hypertension   . Myocardial infarction [facility]) ~ 2007   "mild"  . Osteomyelitis ([place])   . Pneumonia 2000's   "couple times"  . Type II diabetes mellitus ([place]) 2011     Family History  Problem Relation Age of Onset  . Diabetes Mother   . Coronary artery disease Mother        HAS PACEMAKER  . Heart disease Mother   . Heart attack Brother      Social History   Socioeconomic History  . Marital status: Legally Separated    Spouse name: Not on file  . Number of children: Not on file  . Years of education: 37  . Highest education level: Not on file  Occupational History    Employer: UNEMPLOYED  . Occupation: unemployed  Social Needs  . Financial resource strain: Not on file  . Food insecurity:    Worry: Not on file    Inability: Not on file  . Transportation needs:    Medical: Not on file    Non-medical: Not on file  Tobacco Use  . Smoking status: Former Smoker    Packs/day: 1.00    Years: 32.00    Pack years: 32.00    Types: Cigarettes    Last attempt to quit: 07/06/2007    Years since quitting: 10.4  . Smokeless tobacco: Never Used  Substance and Sexual Activity  . Alcohol use: Yes    Alcohol/week: 0.0 oz    Comment: Sometimes.  . Drug use: No  . Sexual activity: Not on file  Lifestyle  . Physical activity:    Days per week: Not on file    Minutes per session: Not on file  . Stress: Not on file  Relationships  . Social connections:    Talks on phone: Not on file    Gets together: Not on file    Attends religious service: Not on file    Active  member of club or organization: Not on file    Attends meetings of clubs or organizations: Not on file    Relationship status: Not on file  . Intimate partner violence:    Fear of current or ex partner: Not on file    Emotionally abused: Not on file    Physically abused: Not on file    Forced sexual activity: Not on file  Other Topics Concern  . Not on file  Social History Narrative  . Not on file     Allergies  Allergen Reactions  . Morphine Itching  . Pravastatin Sodium Other (See Comments)    Severe muscle cramps with one time requiring admission.      Outpatient Medications Prior to Visit  Medication Sig Dispense Refill  . acetaminophen (TYLENOL) 500 MG tablet Take 1,000 mg by mouth every 6 (six) hours as needed for moderate pain (gout).    Marland Kitchen allopurinol (ZYLOPRIM) 100 MG tablet TAKE 1 TABLET BY MOUTH  DAILY 90 tablet 1  . amLODipine (NORVASC) 5 MG tablet Take 1 tablet (  5 mg total) by mouth daily. 90 tablet 0  . aspirin EC 81 MG tablet Take 1 tablet (81 mg total) by mouth daily. 30 tablet 3  . Blood Glucose Calibration (ONETOUCH VERIO) SOLN Use to check blood glucose as instructed. E11.9. 1 each 0  . Blood Glucose Monitoring Suppl (ONE TOUCH ULTRA 2) W/DEVICE KIT Check blood sugar twice daily as instructed dx code 250.00 1 each 0  . carvedilol (COREG) 12.5 MG tablet TAKE 1 TABLET BY MOUTH TWO  TIMES DAILY 180 tablet 1  . cilostazol (PLETAL) 50 MG tablet Take 1 tablet (50 mg total) by mouth 2 (two) times daily. 180 tablet 3  . colchicine 0.6 MG tablet TAKE 1 TABLET BY MOUTH  DAILY AS NEEDED FOR GOUT  FLARE 30 tablet 0  . empagliflozin (JARDIANCE) 10 MG TABS tablet Take 10 mg by mouth daily. 90 tablet 1  . glipiZIDE (GLUCOTROL) 10 MG tablet TAKE 1 TABLET BY MOUTH  TWICE A DAY BEFORE A MEAL 180 tablet 0  . glucose blood (ONETOUCH VERIO) test strip UsE 1 TIME DAILY TO TEST BLOOD SUGAR. 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 Insulin Pen Needle 31G X 5 MM MISC Use to inject  liraglutide once daily as instructed Dx E11.9 100 each 5  . Insulin Pen Needle 32G X 4 MM MISC Use to inject victoza one time a day 100 each 3  . liraglutide (VICTOZA) 18 MG/3ML SOPN Inject 1.8 mg into the skin daily as instructed 3 pen 11  . lisinopril (PRINIVIL,ZESTRIL) 20 MG tablet TAKE 1 TABLET BY MOUTH  DAILY 90 tablet 1  . nitroGLYCERIN (NITROSTAT) 0.4 MG SL tablet DISSOLVE 1 TABLET UNDER THE TONGUE EVERY 5 MINUTES AS  NEEDED FOR CHEST PAIN , MAX 3 TABLETS IN 15 MINUTES,  CALL 911 IF NO RELIEF. 25 tablet 1  . ONETOUCH DELICA LANCETS FINE MISC Use to check blood sugar up to one time a day 100 each 12  . rosuvastatin (CRESTOR) 20 MG tablet Take 1 tablet (20 mg total) by mouth daily. 90 tablet 3   No facility-administered medications prior to visit.     ROS    Objective:  Physical Exam   There were no vitals filed for this visit.  ***  CBC    Component Value Date/Time   WBC 9.2 10/17/2017 1650   WBC 9.5 09/28/2015 1017   RBC 6.04 (H) 10/17/2017 1650   RBC 5.62 09/28/2015 1017   HGB 13.4 10/17/2017 1650   HCT 42.9 10/17/2017 1650   PLT 305 10/17/2017 1650   MCV 71 (L) 10/17/2017 1650   MCH 22.2 (L) 10/17/2017 1650   MCH 22.4 (L) 09/28/2015 1017   MCHC 31.2 (L) 10/17/2017 1650   MCHC 33.3 09/28/2015 1017   RDW 15.9 (H) 10/17/2017 1650   LYMPHSABS 2.9 01/12/2016 1605   MONOABS 0.9 09/28/2015 1017   EOSABS 0.2 01/12/2016 1605   BASOSABS 0.0 01/12/2016 1605     Chest imaging: 09/2017 Calcium score CT chest> aortic atherosclerosis, severe hepatic steatosis, small hiatal hernia  PFT: ***  Labs:  Path:  Echo:  Heart Catheterization:  Records from her PCP visit reviewed where she was seen for DM2.      Assessment & Plan:   No diagnosis found.  Discussion: ***    Current Outpatient Medications:  .  acetaminophen (TYLENOL) 500 MG tablet, Take 1,000 mg by mouth every 6 (six) hours as needed for moderate pain (gout)., Disp: , Rfl:  .  allopurinol  (  ZYLOPRIM) 100 MG tablet, TAKE 1 TABLET BY MOUTH  DAILY, Disp: 90 tablet, Rfl: 1 .  amLODipine (NORVASC) 5 MG tablet, Take 1 tablet (5 mg total) by mouth daily., Disp: 90 tablet, Rfl: 0 .  aspirin EC 81 MG tablet, Take 1 tablet (81 mg total) by mouth daily., Disp: 30 tablet, Rfl: 3 .  Blood Glucose Calibration (ONETOUCH VERIO) SOLN, Use to check blood glucose as instructed. E11.9., Disp: 1 each, Rfl: 0 .  Blood Glucose Monitoring Suppl (ONE TOUCH ULTRA 2) W/DEVICE KIT, Check blood sugar twice daily as instructed dx code 250.00, Disp: 1 each, Rfl: 0 .  carvedilol (COREG) 12.5 MG tablet, TAKE 1 TABLET BY MOUTH TWO  TIMES DAILY, Disp: 180 tablet, Rfl: 1 .  cilostazol (PLETAL) 50 MG tablet, Take 1 tablet (50 mg total) by mouth 2 (two) times daily., Disp: 180 tablet, Rfl: 3 .  colchicine 0.6 MG tablet, TAKE 1 TABLET BY MOUTH  DAILY AS NEEDED FOR GOUT  FLARE, Disp: 30 tablet, Rfl: 0 .  empagliflozin (JARDIANCE) 10 MG TABS tablet, Take 10 mg by mouth daily., Disp: 90 tablet, Rfl: 1 .  glipiZIDE (GLUCOTROL) 10 MG tablet, TAKE 1 TABLET BY MOUTH  TWICE A DAY BEFORE A MEAL, Disp: 180 tablet, Rfl: 0 .  glucose blood (ONETOUCH VERIO) test strip, UsE 1 TIME DAILY TO TEST BLOOD SUGAR. 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  Insulin Pen Needle 31G X 5 MM MISC, Use to inject liraglutide once daily as instructed Dx E11.9, Disp: 100 each, Rfl: 5 .  Insulin Pen Needle 32G X 4 MM MISC, Use to inject victoza one time a day, Disp: 100 each, Rfl: 3 .  liraglutide (VICTOZA) 18 MG/3ML SOPN, Inject 1.8 mg into the skin daily as instructed, Disp: 3 pen, Rfl: 11 .  lisinopril (PRINIVIL,ZESTRIL) 20 MG tablet, TAKE 1 TABLET BY MOUTH  DAILY, Disp: 90 tablet, Rfl: 1 .  nitroGLYCERIN (NITROSTAT) 0.4 MG SL tablet, DISSOLVE 1 TABLET UNDER THE TONGUE EVERY 5 MINUTES AS  NEEDED FOR CHEST PAIN , MAX 3 TABLETS IN 15 MINUTES,  CALL 911 IF NO RELIEF., Disp: 25 tablet, Rfl: 1 .  ONETOUCH DELICA LANCETS FINE MISC, Use to check  blood sugar up to one time a day, Disp: 100 each, Rfl: 12 .  rosuvastatin (CRESTOR) 20 MG tablet, Take 1 tablet (20 mg total) by mouth daily., Disp: 90 tablet, Rfl: 3

## 2017-12-04 NOTE — Addendum Note (Signed)
Addended by: Neomia DearPOWERS, Madilynn Montante E on: 12/04/2017 05:11 PM   Modules accepted: Orders

## 2017-12-05 ENCOUNTER — Ambulatory Visit: Payer: Medicare Other | Admitting: Podiatry

## 2018-01-22 ENCOUNTER — Other Ambulatory Visit: Payer: Self-pay

## 2018-01-22 NOTE — Patient Outreach (Signed)
[facility]) Care Management  01/22/2018  Runell GessGarry Bushee 05/03/1956 [id number]   Medication Adherence call to Mr. Runell GessGarry [place] spoke with patient he needs both medication Lisinopril 20 mg and Rosuvastatin 20 mg but does not have the money to pay for them he also needs Jardiance but can not afford to buy it, he said he has a bill standing with [facility] and pharmacy wont fill his medication until he pays the standing bill. patient will be refer to Raynelle FanningJulie the pharmacist for patients Assistance    [facility]na Ollison-Moran CPhT Pharmacy Technician [facility] Care Management Direct Dial [phone]  Fax [phone] [email or web address]

## 2018-01-23 ENCOUNTER — Encounter: Payer: Medicare Other | Admitting: Internal Medicine

## 2018-01-24 ENCOUNTER — Other Ambulatory Visit: Payer: Self-pay | Admitting: Pharmacist

## 2018-01-24 ENCOUNTER — Ambulatory Visit: Payer: Self-pay | Admitting: Pharmacist

## 2018-01-24 NOTE — Patient Outreach (Signed)
[facility]) Care Management  01/24/2018  Daniel Perkins 11/07/1955 [id number]   Unsuccessful outreach attempt #1 to Daniel Perkins. Phone rang >20 times with no voicemail available.  [facility] via med adherence calls.  He is having trouble obtaining medications due to outstanding bill.  Potential med assistance for Jardiance as well.  PLAN: -I will f/u with patient within 4 business days  Daniel BrightlyJulie Dattero [name], PharmD, [facility] Clinical Pharmacist [facility]  [phone]

## 2018-01-29 ENCOUNTER — Ambulatory Visit: Payer: Self-pay | Admitting: Pharmacist

## 2018-01-30 ENCOUNTER — Ambulatory Visit: Payer: Self-pay | Admitting: Pharmacist

## 2018-02-01 ENCOUNTER — Ambulatory Visit: Payer: Self-pay | Admitting: Pharmacist

## 2018-02-01 ENCOUNTER — Other Ambulatory Visit: Payer: Self-pay | Admitting: Pharmacist

## 2018-02-02 NOTE — Patient Outreach (Signed)
[facility]) Care Management  02/02/2018  Daniel Perkins Jan 23, 1956 [id number]  Unsuccessful outreach call to [name] today.  Patient's father answered and stated he didn't know when patient would be home. [facility] pharmacist stated she would attempt to call patient in the next few business days.  Phone number was left encouraging call back.  PLAN: -I will follow up with patient within 4 business days  Kieth Brightly, PharmD, [facility] Clinical Pharmacist [facility]  [phone]

## 2018-02-12 ENCOUNTER — Other Ambulatory Visit: Payer: Self-pay | Admitting: Pharmacist

## 2018-02-12 NOTE — Patient Outreach (Signed)
[facility]) Care Management  02/12/2018  Daniel Perkins 1955-11-12 [id number]  Unsuccessful outreach call to Daniel Perkins today.  Again, patient's father answered and stated he didn't know when patient would be home. [facility] pharmacist stated she would attempt to call patient in the next few business days. Phone number was left encouraging call back.  PLAN: -I will follow up with patient within 4 business days -Unsuccessful letter sent   Kieth Brightly, PharmD, [facility] Clinical Pharmacist [facility]  [phone]

## 2018-02-16 ENCOUNTER — Other Ambulatory Visit: Payer: Self-pay | Admitting: Pharmacist

## 2018-02-16 ENCOUNTER — Ambulatory Visit: Payer: Self-pay | Admitting: Pharmacist

## 2018-02-16 NOTE — Patient Outreach (Signed)
[facility]) Care Management  [facility] Pharmacy  02/16/2018  Daniel Perkins 1955-06-08 [id number]   Reason for referral: medication assistance/adherence  Unsuccessful telephone call attempt #3 to patient.   Unsuccessful outreach call outcomes: Message left with dad requesting a return call from patient  Plan:  Unsuccessful outreach call plans: I will follow-up on 10th business day from opening case.  If no response from patient at this time, I will close [facility] case.    Kieth Brightly, PharmD, [facility] Clinical Pharmacist [facility]  [phone]

## 2018-02-19 ENCOUNTER — Ambulatory Visit: Payer: Medicare Other | Admitting: Pharmacist

## 2018-02-23 ENCOUNTER — Other Ambulatory Visit: Payer: Self-pay | Admitting: Pharmacist

## 2018-02-23 ENCOUNTER — Ambulatory Visit: Payer: Self-pay | Admitting: Pharmacist

## 2018-02-23 NOTE — Patient Outreach (Signed)
[facility]) Care Management [facility] Pharmacy  02/23/2018  Lorry Anastasi 1955-11-28 [id number]  Reason for referral: medication adherence   [facility] pharmacy case is being closed due to the following reasons:  We have been unable to establish and/or maintain contact with the patient.  Patient has been provided [facility] CM contact information if assistance needed in the future.    Thank you for allowing [facility] pharmacy to be involved in this patient's care.    Kieth Brightly, PharmD, [facility] Clinical Pharmacist [facility]  [phone]

## 2018-02-27 ENCOUNTER — Telehealth: Payer: Self-pay | Admitting: *Deleted

## 2018-02-27 NOTE — Telephone Encounter (Signed)
Pt calls and states for appr 1 week he has 7-8 episodes of chest soreness for a few seconds daily. Denies h/a, diaphoretic, N&V. He does say his breathing changes. He REFUSES ED. Wants clinic appt for wed 10/23. ACC 10/23 at 1315. He is advised to please reconsider ED and if symptoms continue to please call 911 he is agreeable

## 2018-02-27 NOTE — Telephone Encounter (Signed)
Agree. Thanks

## 2018-02-28 ENCOUNTER — Ambulatory Visit: Payer: Medicare Other

## 2018-02-28 ENCOUNTER — Encounter: Payer: Self-pay | Admitting: Internal Medicine

## 2018-03-05 ENCOUNTER — Other Ambulatory Visit: Payer: Self-pay | Admitting: Internal Medicine

## 2018-03-05 DIAGNOSIS — I10 Essential (primary) hypertension: Secondary | ICD-10-CM

## 2018-03-05 DIAGNOSIS — M10042 Idiopathic gout, left hand: Secondary | ICD-10-CM

## 2018-03-05 NOTE — Telephone Encounter (Signed)
Needs refill on lisinopril (PRINIVIL,ZESTRIL) 20 MG tablet , amLODipine (NORVASC) 5 MG tablet, cilostazol (PLETAL) 50 MG tablet, allopurinol (ZYLOPRIM) 100 MG tablet, rosuvastatin (CRESTOR) 20 MG tablet(Expired) at [address]  ;pt contact [phone]

## 2018-03-06 ENCOUNTER — Other Ambulatory Visit: Payer: Self-pay | Admitting: *Deleted

## 2018-03-06 DIAGNOSIS — I10 Essential (primary) hypertension: Secondary | ICD-10-CM

## 2018-03-06 MED ORDER — CILOSTAZOL 50 MG PO TABS: 50.0000 mg | ORAL_TABLET | Freq: Two times a day (BID) | ORAL | 3 refills | Status: DC

## 2018-03-06 MED ORDER — LISINOPRIL 20 MG PO TABS: 20.0000 mg | ORAL_TABLET | Freq: Every day | ORAL | 3 refills | Status: DC

## 2018-03-06 MED ORDER — ALLOPURINOL 100 MG PO TABS: 100.0000 mg | ORAL_TABLET | Freq: Every day | ORAL | 3 refills | Status: DC

## 2018-03-06 MED ORDER — AMLODIPINE BESYLATE 5 MG PO TABS: 5.0000 mg | ORAL_TABLET | Freq: Every day | ORAL | 3 refills | Status: DC

## 2018-03-06 MED ORDER — ROSUVASTATIN CALCIUM 20 MG PO TABS: 20.0000 mg | ORAL_TABLET | Freq: Every day | ORAL | 3 refills | Status: DC

## 2018-03-06 NOTE — Telephone Encounter (Signed)
Please re-send - refilled "No Print" instead of electronically. Thanks

## 2018-04-17 ENCOUNTER — Encounter: Payer: Medicare Other | Admitting: Internal Medicine

## 2018-06-03 NOTE — Progress Notes (Signed)
   CC: HTN and DM f/u  HPI:   Mr.Safi Bursey is a 63 y.o. man with a medical history of HTN, DMII, CAD, HLD, and gout who presents to the internal medicine clinic for DM and HTN f/u. We also discussed new chest pain and an ingrown toenail at this visit. The patient has been unable to afford his medications recently. 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 Because of these difficulties obtaining medications, he has not been taking his blood pressure or diabetes medications. Please see problem based charting for the history and status of the patient's current and chronic medical conditions.   Past Medical History:  Diagnosis Date  . Club foot of both lower extremities 1958  . Coronary artery disease    Holter monitor 02/2012 - sinus with rare PVC  . CTEV (congenital talipes equinovarus)   . Gout   . Hyperlipidemia   . Hypertension   . Myocardial infarction [facility]) ~ 2007   "mild"  . Osteomyelitis (HCC)   . Pneumonia 2000's   "couple times"  . Type II diabetes mellitus (HCC) 2011    Review of Systems:   Pertinent positives mentioned in HPI. Remainder of all ROS negative.  Physical Exam: Vitals:   06/05/18 1639  BP: (!) 161/84  Pulse: 98  Temp: 97.9 F (36.6 C)  TempSrc: Oral  SpO2: 99%  Weight: 235 lb 14.4 oz (107 kg)  Height: 5\' 8"  (1.727 m)   Physical Exam  Constitutional: Well-developed, well-nourished, and in no distress.  Eyes: Pupils are equal, round, and reactive to light. EOM are normal.  Cardiovascular: Normal rate and regular rhythm. No murmurs, rubs, or gallops. Pulmonary/Chest: Effort normal. Clear to auscultation bilaterally. No wheezes, rales, or rhonchi. Abdominal: Bowel sounds present. Soft, non-distended, non-tender. Ext: No lower extremity edema. Club foot deformity s/p corrective surgery on the left foot. The left big toenail is growing at a 90 degree angle and is pressing into the second toe.  Skin:  Warm and dry. No rashes or wounds.   Assessment & Plan:   See Encounters Tab for problem based charting.  Patient discussed with Dr. Rogelia Boga

## 2018-06-05 ENCOUNTER — Encounter: Payer: Self-pay | Admitting: Internal Medicine

## 2018-06-05 ENCOUNTER — Other Ambulatory Visit: Payer: Self-pay

## 2018-06-05 ENCOUNTER — Ambulatory Visit (INDEPENDENT_AMBULATORY_CARE_PROVIDER_SITE_OTHER): Payer: Medicare Other | Admitting: Internal Medicine

## 2018-06-05 ENCOUNTER — Encounter (INDEPENDENT_AMBULATORY_CARE_PROVIDER_SITE_OTHER): Payer: Self-pay

## 2018-06-05 VITALS — BP 161/84 | HR 98 | Temp 97.9°F | Ht 68.0 in | Wt 235.9 lb

## 2018-06-05 DIAGNOSIS — E785 Hyperlipidemia, unspecified: Secondary | ICD-10-CM

## 2018-06-05 DIAGNOSIS — Z7982 Long term (current) use of aspirin: Secondary | ICD-10-CM

## 2018-06-05 DIAGNOSIS — M10042 Idiopathic gout, left hand: Secondary | ICD-10-CM

## 2018-06-05 DIAGNOSIS — I10 Essential (primary) hypertension: Secondary | ICD-10-CM | POA: Diagnosis not present

## 2018-06-05 DIAGNOSIS — Z91120 Patient's intentional underdosing of medication regimen due to financial hardship: Secondary | ICD-10-CM

## 2018-06-05 DIAGNOSIS — I2510 Atherosclerotic heart disease of native coronary artery without angina pectoris: Secondary | ICD-10-CM

## 2018-06-05 DIAGNOSIS — L600 Ingrowing nail: Secondary | ICD-10-CM | POA: Diagnosis not present

## 2018-06-05 DIAGNOSIS — E119 Type 2 diabetes mellitus without complications: Secondary | ICD-10-CM

## 2018-06-05 DIAGNOSIS — R079 Chest pain, unspecified: Secondary | ICD-10-CM

## 2018-06-05 DIAGNOSIS — Z79899 Other long term (current) drug therapy: Secondary | ICD-10-CM

## 2018-06-05 DIAGNOSIS — Z7984 Long term (current) use of oral hypoglycemic drugs: Secondary | ICD-10-CM

## 2018-06-05 DIAGNOSIS — R0602 Shortness of breath: Secondary | ICD-10-CM

## 2018-06-05 DIAGNOSIS — I252 Old myocardial infarction: Secondary | ICD-10-CM

## 2018-06-05 DIAGNOSIS — M109 Gout, unspecified: Secondary | ICD-10-CM

## 2018-06-05 DIAGNOSIS — Z9861 Coronary angioplasty status: Secondary | ICD-10-CM

## 2018-06-05 LAB — POCT GLYCOSYLATED HEMOGLOBIN (HGB A1C): HEMOGLOBIN A1C: 9.7 % — AB (ref 4.0–5.6)

## 2018-06-05 LAB — GLUCOSE, CAPILLARY: Glucose-Capillary: 282 mg/dL — ABNORMAL HIGH (ref 70–99)

## 2018-06-05 MED ORDER — LIRAGLUTIDE 18 MG/3ML ~~LOC~~ SOPN: PEN_INJECTOR | SUBCUTANEOUS | 11 refills | Status: DC

## 2018-06-05 MED ORDER — EMPAGLIFLOZIN 10 MG PO TABS: 10.0000 mg | ORAL_TABLET | Freq: Every day | ORAL | 1 refills | Status: DC

## 2018-06-05 NOTE — Patient Instructions (Signed)
It was a pleasure to see you today, Daniel Perkins.  1. For your chest pain - I have referred you to cardiology. You should hear from their office about scheduling an appointment. - Continue to take your aspirin daily   2. For your ingrown toenail - I have referred you to podiatry. You should hear from their office about scheduling an appointment.  3. For your medications - It is very important that you resume your medications to better control your blood pressure and diabetes - The medications you do not have, but you should restart as soon as possible are:  1. Crestor 2. Victoza  3. Jardiance (samples provided) 4. Lisinopril 5. Amlodipine  - I have refilled any outstanding prescriptions. If you are unable to get the Rx, please let me know. Call our clinic at [phone].  Please follow-up in 4 weeks.  Thanks, Dr. Avie Arenas

## 2018-06-06 ENCOUNTER — Encounter: Payer: Self-pay | Admitting: Internal Medicine

## 2018-06-06 DIAGNOSIS — L600 Ingrowing nail: Secondary | ICD-10-CM | POA: Insufficient documentation

## 2018-06-06 NOTE — Assessment & Plan Note (Signed)
A1c has increased from 7.5 6 months ago to 9.7 today. His current regimen includes Victoza 1.8mg  daily, Jardiance, and glipizide, however he ran out of the victoza and jardiance and has only been taking the glipizide. He has not checked his sugars in a few months because he ran out of glucometer strips. No symptoms concerning for hypoglycemic episodes. He reports that he has been drinking a lot of orange soda, which he knows he needs to stop. We talked about switching to diet green tea today.   - Resume Victoza 1.8mg  daily and Jardiance 10mg  daily.  - I provided the patient with a sample of jardiance (14 day supply) - Continue glipizide. - I advised the patient to call me if he is unable to pick up these medications from the pharmacy. If he is unable to get the medications, I will contact [email or web address] about his outstanding bill. If the bill is reasonable, the [facility] may be able to help with our petty cash store. If not, I will reach out to Dr. Selena Batten about affordable medication options.

## 2018-06-06 NOTE — Assessment & Plan Note (Addendum)
BP today is elevated at 161/84. His home regimen includes amlodipine 5mg , coreg 12.5mg , and lisinopril 20mg  daily. However, he has not been taking his lisinopril because he ran out. He did not take his amlodipine or coreg this morning either as he was in a rush and forgot. Therefore, will not make any changes to his medication regimen today as he has previously been controlled while adherent to these medications. He has refills of his medications available, but he may not be able to get them due to cost. He has Medicare, but has outstanding bills at his pharmacy and has been unable to pick up any new medications.  Plan - Resume amlodipine 5mg , coreg 12.5mg , and lisinopril 20mg  daily - I advised the patient to call me if he is unable to pick up these medications from the pharmacy. If he is unable to get the medications, I will contact [email or web address] about his outstanding bill. If the bill is reasonable, the [facility] may be able to help with our petty cash store. If not, I will reach out to Dr. Selena Batten about affordable medication options. - F/u in 4 weeks for repeat BP

## 2018-06-06 NOTE — Assessment & Plan Note (Addendum)
Mr. Pittinger reports that he has experienced dyspnea and chest pain with exertion for the past few months. He has been experiencing dyspnea with exertion for much longer. In fact, this has been worked up with PFTs and referral to pulmonology (which he has not yet been to) and a sleep study has been ordered. However, more recently, he experiences chest pain along with the dyspnea. He describes the chest pain as a pressure type pain in the middle of his chest that does not radiate and is not associated with nausea, vomiting, or diaphoresis. He has a history of CAD and MI. He reports that this feels different than his last MI. He states that the pain and dyspnea occur when he walks or climbs steps. The pain lasts for 3-4 minutes before resolving. He does not have nitroglycerin at home. He underwent cardiac cath in 2011 which showed mild nonobstructive CAD. A 2012 stress test was normal. Coronary CTA done March 2018 showed low calcium score and normal coronary arteries. He last saw a cardiologist Dr. Tenny Craw for similar chest pain in March of 2019. The plan was for coronary CT angiography at that time. He has not yet undergone this testing.   Plan - Continue daily aspirin - Resume Crestor - Continue Coreg - Urgent referral placed to cardiology  - Patient was advised to seek medical attention sooner if he develops chest pain at rest or chest pain that does not resolve  underwent cardiac cath in 2011  This showed mild nonobstructive CAD  Echo showed normal LV systolic function and diastolic dysfunction  2012 stress myview was normal.

## 2018-06-06 NOTE — Assessment & Plan Note (Signed)
His left big toenail is growing at a 90 degree angle and is pressing into the second toe. This is causing pain to his second toe. He has been treated by podiatry in the past.  Plan - Referral to podiatry.

## 2018-06-06 NOTE — Assessment & Plan Note (Addendum)
Daniel Perkins ranout of his Crestor and hasn't been taking it.   Plan - Resume Crestor 20mg  daily - I advised the patient to call me if he is unable to pick up these medications from the pharmacy. If he is unable to get the medications, I will contact [email or web address] about his outstanding bill. If the bill is reasonable, the [facility] may be able to help with our petty cash store. If not, I will reach out to Dr. Selena Batten about affordable medication options.

## 2018-06-06 NOTE — Assessment & Plan Note (Signed)
See section on chest pain.

## 2018-06-06 NOTE — Assessment & Plan Note (Signed)
No recent flare.  Plan - Refill allopurinol

## 2018-06-08 NOTE — Progress Notes (Signed)
Internal Medicine Clinic Attending  Case discussed with Dr. Dorrell at the time of the visit.  We reviewed the resident's history and exam and pertinent patient test results.  I agree with the assessment, diagnosis, and plan of care documented in the resident's note.    

## 2018-06-14 ENCOUNTER — Encounter: Payer: Self-pay | Admitting: *Deleted

## 2018-06-26 ENCOUNTER — Ambulatory Visit (INDEPENDENT_AMBULATORY_CARE_PROVIDER_SITE_OTHER): Payer: Medicare Other | Admitting: Cardiovascular Disease

## 2018-06-26 ENCOUNTER — Ambulatory Visit (INDEPENDENT_AMBULATORY_CARE_PROVIDER_SITE_OTHER): Payer: Medicare Other | Admitting: Podiatry

## 2018-06-26 ENCOUNTER — Encounter: Payer: Self-pay | Admitting: Cardiovascular Disease

## 2018-06-26 VITALS — BP 132/74 | HR 85 | Ht 68.0 in | Wt 236.4 lb

## 2018-06-26 DIAGNOSIS — I10 Essential (primary) hypertension: Secondary | ICD-10-CM | POA: Diagnosis not present

## 2018-06-26 DIAGNOSIS — R0789 Other chest pain: Secondary | ICD-10-CM

## 2018-06-26 DIAGNOSIS — R0602 Shortness of breath: Secondary | ICD-10-CM | POA: Diagnosis not present

## 2018-06-26 DIAGNOSIS — E785 Hyperlipidemia, unspecified: Secondary | ICD-10-CM | POA: Diagnosis not present

## 2018-06-26 DIAGNOSIS — M79675 Pain in left toe(s): Secondary | ICD-10-CM | POA: Diagnosis not present

## 2018-06-26 DIAGNOSIS — E1151 Type 2 diabetes mellitus with diabetic peripheral angiopathy without gangrene: Secondary | ICD-10-CM | POA: Diagnosis not present

## 2018-06-26 DIAGNOSIS — E119 Type 2 diabetes mellitus without complications: Secondary | ICD-10-CM | POA: Diagnosis not present

## 2018-06-26 DIAGNOSIS — B351 Tinea unguium: Secondary | ICD-10-CM | POA: Diagnosis not present

## 2018-06-26 DIAGNOSIS — Q828 Other specified congenital malformations of skin: Secondary | ICD-10-CM

## 2018-06-26 DIAGNOSIS — I739 Peripheral vascular disease, unspecified: Secondary | ICD-10-CM | POA: Diagnosis not present

## 2018-06-26 DIAGNOSIS — M79674 Pain in right toe(s): Secondary | ICD-10-CM | POA: Diagnosis not present

## 2018-06-26 MED ORDER — NITROGLYCERIN 0.4 MG SL SUBL: SUBLINGUAL_TABLET | SUBLINGUAL | 1 refills | Status: DC

## 2018-06-26 MED ORDER — ROSUVASTATIN CALCIUM 20 MG PO TABS: 20.0000 mg | ORAL_TABLET | Freq: Every day | ORAL | 3 refills | Status: DC

## 2018-06-26 NOTE — Progress Notes (Signed)
Cardiology Office Note   Date:  06/26/2018   ID:  Daniel Perkins, DOB [birth year], MRN [id number]  PCP:  Corinne Ports, MD  Cardiologist:  Dr. Harrington Challenger.   No chief complaint on file.     History of Present Illness: Daniel Perkins is a 63 y.o. male who is here today for a follow up visit regarding peripheral arterial disease. He has history of minimal coronary plaque by previous cardiac catheterization, diabetes, hypertension and hyperlipidemia.  He quit smoking in 2010.   Patient has prolonged symptoms of exertional shortness of breath and atypical chest pain.  He was evaluated last year by a nuclear stress test which showed no evidence of ischemia with normal ejection fraction.  He continued to have symptoms and thus he underwent CTA of the coronary arteries in May 2019 which showed a calcium score of 5 with normal coronary arteries. He had a screening ABI that was abnormal.  He underwent lower extremity arterial Doppler which showed normal ABI on the left side and mildly to moderately reduced on the right side at 0.78.  Duplex showed occluded mid SFA in a short segment with one vessel runoff below the knee via the peroneal artery. He has known history of clubfoot of both lower extremities which limits his physical activities.   He was treated medically given his minimal symptoms.  He has been doing reasonably well but continues to complain of exertional dyspnea and occasionally with chest discomfort.  He denies leg claudication at the present time.  Past Medical History:  Diagnosis Date  . Club foot of both lower extremities 1958  . Coronary artery disease    Holter monitor 02/2012 - sinus with rare PVC  . CTEV (congenital talipes equinovarus)   . Gout   . Hyperlipidemia   . Hypertension   . Myocardial infarction [facility]) ~ 2007   "mild"  . Osteomyelitis ([place])   . Pneumonia 2000's   "couple times"  . Type II diabetes mellitus ([place]) 2011    Past Surgical History:    Procedure Laterality Date  . ANTERIOR CERVICAL DECOMP/DISCECTOMY FUSION  X 2  . CARDIAC CATHETERIZATION    . FOOT FRACTURE SURGERY Right 1990's   "pt a steel plate in"  . FOOT SURGERY Left x16   "joints collapsed; keep getting infected"  . POSTERIOR CERVICAL FUSION/FORAMINOTOMY  X 2     Current Outpatient Medications  Medication Sig Dispense Refill  . acetaminophen (TYLENOL) 500 MG tablet Take 1,000 mg by mouth every 6 (six) hours as needed for moderate pain (gout).    Marland Kitchen allopurinol (ZYLOPRIM) 100 MG tablet Take 1 tablet (100 mg total) by mouth daily. 90 tablet 3  . amLODipine (NORVASC) 5 MG tablet Take 1 tablet (5 mg total) by mouth daily. 90 tablet 3  . aspirin EC 81 MG tablet Take 1 tablet (81 mg total) by mouth daily. 30 tablet 3  . Blood Glucose Calibration (ONETOUCH VERIO) SOLN Use to check blood glucose as instructed. E11.9. 1 each 0  . Blood Glucose Monitoring Suppl (ONE TOUCH ULTRA 2) W/DEVICE KIT Check blood sugar twice daily as instructed dx code 250.00 1 each 0  . carvedilol (COREG) 12.5 MG tablet TAKE 1 TABLET BY MOUTH TWO  TIMES DAILY 180 tablet 1  . cilostazol (PLETAL) 50 MG tablet Take 1 tablet (50 mg total) by mouth 2 (two) times daily. 180 tablet 3  . colchicine 0.6 MG tablet TAKE 1 TABLET BY MOUTH  DAILY AS NEEDED FOR GOUT  FLARE 30 tablet 0  . empagliflozin (JARDIANCE) 10 MG TABS tablet Take 10 mg by mouth daily. 90 tablet 1  . fluvastatin (LESCOL) 20 MG capsule Take 20 mg by mouth at bedtime.    Marland Kitchen glipiZIDE (GLUCOTROL) 10 MG tablet TAKE 1 TABLET BY MOUTH  TWICE A DAY BEFORE A MEAL 180 tablet 0  . glucose blood (ONETOUCH VERIO) test strip UsE 1 TIME DAILY TO TEST BLOOD SUGAR. 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 Insulin Pen Needle 31G X 5 MM MISC Use to inject liraglutide once daily as instructed Dx E11.9 100 each 5  . Insulin Pen Needle 32G X 4 MM MISC Use to inject victoza one time a day 100 each 3  . liraglutide (VICTOZA) 18 MG/3ML SOPN Inject 1.8 mg into  the skin daily as instructed 3 pen 11  . lisinopril (PRINIVIL,ZESTRIL) 20 MG tablet Take 1 tablet (20 mg total) by mouth daily. 90 tablet 3  . ONETOUCH DELICA LANCETS FINE MISC Use to check blood sugar up to one time a day 100 each 12  . nitroGLYCERIN (NITROSTAT) 0.4 MG SL tablet DISSOLVE 1 TABLET UNDER THE TONGUE EVERY 5 MINUTES AS  NEEDED FOR CHEST PAIN , MAX 3 TABLETS IN 15 MINUTES,  CALL 911 IF NO RELIEF. (Patient not taking: Reported on 06/26/2018) 25 tablet 1   No current facility-administered medications for this visit.     Allergies:   Morphine and Pravastatin sodium    Social History:  The patient  reports that he quit smoking about 10 years ago. His smoking use included cigarettes. He has a 32.00 pack-year smoking history. He has never used smokeless tobacco. He reports current alcohol use. He reports that he does not use drugs.   Family History:  The patient's family history includes Coronary artery disease in his mother; Diabetes in his mother; Heart attack in his brother; Heart disease in his mother.    ROS:  Please see the history of present illness.   Otherwise, review of systems are positive for none.   All other systems are reviewed and negative.    PHYSICAL EXAM: VS:  BP 132/74   Pulse 85   Ht '5\' 8"'  (1.727 m)   Wt 236 lb 6.4 oz (107.2 kg)   BMI 35.94 kg/m  , BMI Body mass index is 35.94 kg/m. GEN: Well nourished, well developed, in no acute distress  HEENT: normal  Neck: no JVD, carotid bruits, or masses Cardiac: RRR; no rubs, or gallops,no edema .  2 out of 6 systolic ejection murmur in the aortic area. Respiratory:  clear to auscultation bilaterally, normal work of breathing GI: soft, nontender, nondistended, + BS MS: no deformity or atrophy  Skin: warm and dry, no rash Neuro:  Strength and sensation are intact Psych: euthymic mood, full affect Vascular: Femoral pulses normal bilaterally.  Distal pulses are not palpable on the right and faint on the left  side.   EKG:  EKG is ordered today. EKG showed normal sinus rhythm with left anterior fascicular block and nonspecific ST changes.   Recent Labs: 07/04/2017: ALT 16 09/12/2017: BUN 18; Creatinine, Ser 1.25; Potassium 4.7; Sodium 139 10/17/2017: Hemoglobin 13.4; Platelets 305    Lipid Panel    Component Value Date/Time   CHOL 162 07/04/2017 0822   TRIG 135 07/04/2017 0822   HDL 37 (L) 07/04/2017 0822   CHOLHDL 4.4 07/04/2017 0822   CHOLHDL 6.1 06/30/2014 1634   VLDL 45 (H) 06/30/2014 1634   LDLCALC  98 07/04/2017 0822      Wt Readings from Last 3 Encounters:  06/26/18 236 lb 6.4 oz (107.2 kg)  06/05/18 235 lb 14.4 oz (107 kg)  10/17/17 237 lb 9.6 oz (107.8 kg)      No flowsheet data found.    ASSESSMENT AND PLAN:  1.  Peripheral arterial disease: The patient reports no claudication at the present time.  I recommend continuing medical therapy and treatment of risk factors.    2.  Essential hypertension: Blood pressure is controlled on current medications.  3.  Hyperlipidemia: The patient is taking fluvastatin but he was supposed to be on rosuvastatin.  Given his diabetes and peripheral arterial disease, he should be on a potent statin and thus I switch him back to rosuvastatin 20 mg once daily.  4.  Exertional dyspnea and atypical chest pain: The patient reports continued symptoms.  Ischemic cardiac evaluation last year was unremarkable including CTA.  I am going to obtain an echocardiogram to evaluate his diastolic function and pulmonary pressure. I do suspect that there is an element of physical deconditioning and I discussed with him the importance of starting an exercise program and attempting weight loss.   Disposition:   FU with me in 6 months  Signed,  Kathlyn Sacramento, MD  06/26/2018 8:10 AM    Daniel Perkins

## 2018-06-26 NOTE — Patient Instructions (Addendum)
Medication Instructions:  STOP the Fluvastatin START Rosuvastatin 20 mg daily  If you need a refill on your cardiac medications before your next appointment, please call your pharmacy.   Lab work: None ordered  Testing/Procedures: Your physician has requested that you have an echocardiogram. Echocardiography is a painless test that uses sound waves to create images of your heart. It provides your doctor with information about the size and shape of your heart and how well your heart's chambers and valves are working. You may receive an ultrasound enhancing agent through an IV if needed to better visualize your heart during the echo.This procedure takes approximately one hour. There are no restrictions for this procedure.   Follow-Up: At [facility], you and your health needs are our priority.  As part of our continuing mission to provide you with exceptional heart care, we have created designated Provider Care Teams.  These Care Teams include your primary Cardiologist (physician) and Advanced Practice Providers (APPs -  Physician Assistants and Nurse Practitioners) who all work together to provide you with the care you need, when you need it. You will need a follow up appointment in 6 months.  Please call our office 2 months in advance to schedule this appointment.  You may see Dr. Kirke Corin or one of the following Advanced Practice Providers on your designated Care Team:   Corine Shelter, PA-C Judy Pimple, [place] . Marjie Skiff, PA-C

## 2018-06-27 NOTE — Progress Notes (Signed)
Subjective: 63 year old male presents the office today for concerns of elongated toenails particular his left big toenails growing sideways and starting to put pressure to the second toe.  He states his right third toenail is painful as well.  He denies any redness or drainage or any swelling.  He has not has noted toenails trimmed in quite some time he states they back she not been trimmed since I saw him last.  Denies any open sores. Denies any systemic complaints such as fevers, chills, nausea, vomiting. No acute changes since last appointment, and no other complaints at this time.   Objective: AAO x3, NAD DP/PT pulses palpable decreased Protective sensation decreased with Simms Weinstein monofilament Nails are hypertrophic, dystrophic, brittle, discolored, elongated 10.  The left hallux nail is significantly elongated and is putting pressure to the second toe on the left foot.  There is no open sore but is an indentation from the toenail.  No surrounding redness or drainage. Tenderness nails 1-5 bilaterally.  Hyperkeratotic lesion to the right third toe distally.  Upon debridement there is no underlying ulceration drainage or any signs of infection.  No open lesions or pre-ulcerative lesions are identified today. No areas of pinpoint bony tenderness or pain with vibratory sensation. MMT 5/5, ROM WNL. No edema, erythema, increase in warmth to bilateral lower extremities.  No open lesions or pre-ulcerative lesions.  No pain with calf compression, swelling, warmth, erythema  Assessment: 63 year old male with symptomatic onychomycosis, elongated toenails; hyperkeratotic lesion; diabetic foot exam  Plan: -All treatment options discussed with the patient including all alternatives, risks, complications.  -As debrided x10 without any complications or bleeding. -Callus on the right third toe was sharply debrided without any complications or bleeding.  Discussed offloading. -Recommend routine  debridements to help prevent any ulcerations. -Discussed the importance of daily foot inspection. -Patient encouraged to call the office with any questions, concerns, change in symptoms.   Vivi Barrack DPM

## 2018-07-05 ENCOUNTER — Ambulatory Visit (HOSPITAL_COMMUNITY): Payer: Medicare Other | Attending: Internal Medicine

## 2018-07-05 DIAGNOSIS — R0602 Shortness of breath: Secondary | ICD-10-CM

## 2018-07-06 ENCOUNTER — Telehealth: Payer: Self-pay | Admitting: *Deleted

## 2018-07-06 DIAGNOSIS — E119 Type 2 diabetes mellitus without complications: Secondary | ICD-10-CM | POA: Diagnosis not present

## 2018-07-06 LAB — HM DIABETES EYE EXAM

## 2018-07-06 NOTE — Telephone Encounter (Signed)
-----   Message from Iran Ouch, MD sent at 07/05/2018 12:35 PM EST ----- Inform patient that echo was fine.  Normal EF with no significant valvular abnormalities.

## 2018-07-06 NOTE — Telephone Encounter (Signed)
Left a message for the patient to call back.  

## 2018-07-11 ENCOUNTER — Encounter: Payer: Self-pay | Admitting: *Deleted

## 2018-07-11 NOTE — Telephone Encounter (Signed)
Left a message for the patient to call back.  

## 2018-07-12 NOTE — Telephone Encounter (Signed)
Patient made aware of results and verbalized understanding.  

## 2018-07-31 ENCOUNTER — Other Ambulatory Visit: Payer: Self-pay

## 2018-07-31 ENCOUNTER — Encounter: Payer: Self-pay | Admitting: Internal Medicine

## 2018-07-31 ENCOUNTER — Ambulatory Visit (INDEPENDENT_AMBULATORY_CARE_PROVIDER_SITE_OTHER): Payer: Medicare Other | Admitting: Internal Medicine

## 2018-07-31 VITALS — BP 151/91 | HR 98 | Temp 97.8°F | Wt 239.4 lb

## 2018-07-31 DIAGNOSIS — Z79899 Other long term (current) drug therapy: Secondary | ICD-10-CM

## 2018-07-31 DIAGNOSIS — M109 Gout, unspecified: Secondary | ICD-10-CM

## 2018-07-31 DIAGNOSIS — I10 Essential (primary) hypertension: Secondary | ICD-10-CM

## 2018-07-31 DIAGNOSIS — Z87891 Personal history of nicotine dependence: Secondary | ICD-10-CM

## 2018-07-31 DIAGNOSIS — R0602 Shortness of breath: Secondary | ICD-10-CM | POA: Diagnosis not present

## 2018-07-31 DIAGNOSIS — R079 Chest pain, unspecified: Secondary | ICD-10-CM

## 2018-07-31 DIAGNOSIS — I2510 Atherosclerotic heart disease of native coronary artery without angina pectoris: Secondary | ICD-10-CM

## 2018-07-31 DIAGNOSIS — Z7984 Long term (current) use of oral hypoglycemic drugs: Secondary | ICD-10-CM

## 2018-07-31 DIAGNOSIS — E785 Hyperlipidemia, unspecified: Secondary | ICD-10-CM

## 2018-07-31 DIAGNOSIS — E1169 Type 2 diabetes mellitus with other specified complication: Secondary | ICD-10-CM | POA: Diagnosis not present

## 2018-07-31 DIAGNOSIS — Z7982 Long term (current) use of aspirin: Secondary | ICD-10-CM

## 2018-07-31 DIAGNOSIS — E119 Type 2 diabetes mellitus without complications: Secondary | ICD-10-CM

## 2018-07-31 LAB — BRAIN NATRIURETIC PEPTIDE: B Natriuretic Peptide: 17.3 pg/mL (ref 0.0–100.0)

## 2018-07-31 MED ORDER — ALBUTEROL SULFATE HFA 108 (90 BASE) MCG/ACT IN AERS: 2.0000 | INHALATION_SPRAY | Freq: Four times a day (QID) | RESPIRATORY_TRACT | 0 refills | Status: DC | PRN

## 2018-07-31 MED ORDER — CARVEDILOL 12.5 MG PO TABS: 12.5000 mg | ORAL_TABLET | Freq: Two times a day (BID) | ORAL | 1 refills | Status: DC

## 2018-07-31 MED ORDER — GLIPIZIDE 10 MG PO TABS: ORAL_TABLET | ORAL | 0 refills | Status: DC

## 2018-07-31 NOTE — Progress Notes (Signed)
   CC: HTN f/u  HPI:   [name] is a 63 y.o. man with a medical history of HTN, DMII, CAD, HLD, and gout who presents to the internal medicine clinic for HTN f/u. Please see problem based charting for the history and status of the patient's current and chronic medical conditions.   Past Medical History:  Diagnosis Date  . Club foot of both lower extremities 1958  . Coronary artery disease    Holter monitor 02/2012 - sinus with rare PVC  . CTEV (congenital talipes equinovarus)   . Gout   . Hyperlipidemia   . Hypertension   . Myocardial infarction [facility]) ~ 2007   "mild"  . Osteomyelitis ([facility])   . Pneumonia 2000's   "couple times"  . Type II diabetes mellitus ([facility]) 2011    Review of Systems:   Pertinent positives mentioned in HPI. Remainder of all ROS negative.  Physical Exam: Vitals:   07/31/18 1436  BP: (!) 151/91  Pulse: 98  Temp: 97.8 F (36.6 C)  TempSrc: Oral  SpO2: 96%  Weight: 239 lb 6.4 oz (108.6 kg)   Physical Exam  Constitutional: Well-developed, well-nourished, and in no distress.  Eyes: Pupils are equal, round, and reactive to light. EOM are normal.  Cardiovascular: Normal rate and regular rhythm. No murmurs, rubs, or gallops. Pulmonary/Chest: Effort normal. Clear to auscultation bilaterally. No wheezes, rales, or rhonchi. Abdominal: Bowel sounds present. Soft, non-distended, non-tender. Ext: No lower extremity edema. Skin: Warm and dry. No rashes or wounds.   Assessment & Plan:   See Encounters Tab for problem based charting.  Patient discussed with Dr. Cleda Daub

## 2018-07-31 NOTE — Patient Instructions (Addendum)
It was a pleasure to see you today, Mr. Higgason,  I have provided samples of your medications, including - Jardiance - Victoza - Lisinopril - Amlodipine (please take only half a pill each day) - Please pick up your available refills when you can afford your medications. They are at the [facility] on [address].  We will also get blood work today to check your kidneys and heart.  I will also refer you to pulmonology. You will hear from them about setting up an appointment. You can use the albuterol inhaler as needed for shortness of breath.  You should return to clinic 2-3 months for BP check and A1c check.   Feel free to call the clinic at [phone] if you have any other questions.  Thanks, Dr. Avie Arenas

## 2018-08-01 ENCOUNTER — Encounter: Payer: Self-pay | Admitting: Internal Medicine

## 2018-08-01 ENCOUNTER — Telehealth: Payer: Self-pay

## 2018-08-01 LAB — BMP8+ANION GAP
Anion Gap: 19 mmol/L — ABNORMAL HIGH (ref 10.0–18.0)
BUN / CREAT RATIO: 16 (ref 10–24)
BUN: 18 mg/dL (ref 8–27)
CO2: 19 mmol/L — AB (ref 20–29)
CREATININE: 1.15 mg/dL (ref 0.76–1.27)
Calcium: 9.4 mg/dL (ref 8.6–10.2)
Chloride: 103 mmol/L (ref 96–106)
GFR calc Af Amer: 79 mL/min/{1.73_m2} (ref >59)
GFR, EST NON AFRICAN AMERICAN: 68 mL/min/{1.73_m2} (ref >59)
Glucose: 282 mg/dL — ABNORMAL HIGH (ref 65–99)
Potassium: 4.4 mmol/L (ref 3.5–5.2)
SODIUM: 141 mmol/L (ref 134–144)

## 2018-08-01 NOTE — Telephone Encounter (Signed)
Called patient back and left message that his lab work was normal.

## 2018-08-01 NOTE — Assessment & Plan Note (Addendum)
Mr. Heady has been evaluated by cardiology for his chest pain and dyspnea since his last visit. ECHO was unremarkable with EF of 60-65%. Cardiology felt that Mr. Martensen's symptoms may be due to deconditioning and exercise intolerance rather than angina. The patient reports that he continues to have chest pain and dyspnea with exertion. It is stable from his last visit. He states that the pain is short-lived and improves before he gets to his nitroglycerin.   His chest pain may be due to poor exercise tolerance versus stable angina. His current CAD therapy includes amlodipine, coreg, lisinopril, aspirin, crestor, and prn nitroglycerin. If his symptoms continue and BP remains elevated, consider adding a long-acting nitrate.   Plan - Continue current therapy - Encouraged exercise and weight loss

## 2018-08-01 NOTE — Assessment & Plan Note (Signed)
Current regimen includes Victoza 1.8mg  daily, Jardiance 10mg  daily, and glipizide 10mg  BID. Pt reports he ran out of Victoza and Jardiance a few days ago. He has been trying to make dietary changes to improve his blood sugars. He does not check his blood sugars at home. He has gained 4lbs since his last visit two months ago.   Plan - Patient received sample of Jardiance 10mg  (7 tablets) - Patient received sample of Victoza (1 pen) - Continue glipizide 10mg  BID - Encouraged weight loss - Return in 2-3 months for repeat A1c.

## 2018-08-01 NOTE — Assessment & Plan Note (Addendum)
BP elevated to 151/91. His home regimen includes amlodipine 5mg  daily, coreg 12.5mg  BID, and lisinopril 20mg  daily. He reports that he ran out of his amlodipine and lisinopril a few days ago. He has been taking the Coreg. He states that he gets paid on 4/4, so he cannot pick up his medications until then. He has refills available, but cannot afford them at this time. He does not check his blood pressures at home. He denies headaches, vision changes, leg swelling. BMP today was normal except for hyperglycemia.   Plan - Continue amlodipine 5mg  daily, coreg 12.5mg  BID, and lisinopril 20mg  daily - Patient received a sample of amlodipine 10mg  (15 tablets, advised to take 1/2 at a time) - He also received a sample of lisinopril 20mg  (14 tablets) - Coreg refilled - F/u in 2-3 months for BP check

## 2018-08-01 NOTE — Telephone Encounter (Signed)
Requesting lab result. Please call pt back.  

## 2018-08-01 NOTE — Assessment & Plan Note (Signed)
HPI: [name] continues to have both chest pain and dyspnea with exertion. This problem has been present for years and is stable. He has had a negative cardiac work-up with cardiology. He had PFTs in 10/2017, which showed minimal obstructive disease and a severe diffusion deficit. Further exercise studies were recommended. He was referred to pulmonology after these tests, but has not seen them yet. He denies wheezing, coughing, or chest tightness. He denies rashes or skin changes. He quit smoking 9 years ago.   Assessment: Ddx includes poor conditioning, obstructive disease like COPD, interstitial lung disease, and volume overload with heart failure. Exam is significant for clear lungs without crackles or rhonchi and euvolemia. BNP is not elevated.  Plan - Refer to pulmonology  - Start albuterol inhaler prn for dyspnea

## 2018-08-03 NOTE — Progress Notes (Signed)
Internal Medicine Clinic Attending  Case discussed with Dr. Dorrell at the time of the visit.  We reviewed the resident's history and exam and pertinent patient test results.  I agree with the assessment, diagnosis, and plan of care documented in the resident's note.    

## 2018-08-20 ENCOUNTER — Other Ambulatory Visit: Payer: Self-pay

## 2018-08-20 ENCOUNTER — Ambulatory Visit (INDEPENDENT_AMBULATORY_CARE_PROVIDER_SITE_OTHER): Payer: Medicare Other

## 2018-08-20 ENCOUNTER — Encounter: Payer: Self-pay | Admitting: Internal Medicine

## 2018-08-20 ENCOUNTER — Ambulatory Visit (INDEPENDENT_AMBULATORY_CARE_PROVIDER_SITE_OTHER): Payer: Medicare Other | Admitting: Internal Medicine

## 2018-08-20 VITALS — BP 132/80 | HR 96 | Ht 68.0 in | Wt 233.0 lb

## 2018-08-20 DIAGNOSIS — R0609 Other forms of dyspnea: Secondary | ICD-10-CM

## 2018-08-20 DIAGNOSIS — R0602 Shortness of breath: Secondary | ICD-10-CM | POA: Diagnosis not present

## 2018-08-20 DIAGNOSIS — R0600 Dyspnea, unspecified: Secondary | ICD-10-CM

## 2018-08-20 DIAGNOSIS — E669 Obesity, unspecified: Secondary | ICD-10-CM

## 2018-08-20 MED ORDER — VALSARTAN 160 MG PO TABS: 160.0000 mg | ORAL_TABLET | Freq: Every day | ORAL | 11 refills | Status: DC

## 2018-08-20 NOTE — Assessment & Plan Note (Addendum)
Onset 2013  CPST  04/18/12 '@wt'  238  Conclusion: Exercise testing with gas exchange demonstrates a low normal functional capacity when compared to matched sedentary norms. At peak exercise there appears to be a mixed limitation driven mostly by the patient's obesity and related restrictive lung physiology. There was also a hypertensive response to exercise (236/102) and the elevated VE/VCO2 at peak likely reflects diastolic dysfunction.  - PFT's  10/26/17 @ wt = 237  FEV1 2.68  (97 % ) ratio 0.86  p 0 % improvement from saba p ? prior to study with DLCO  50/52c % corrects to 83  % for alv volume  With variable truncation on insp loop on ACEi  - 08/20/2018   Walked RA  2 laps @  approx 24f each @ nl then fast pace  stopped due to  MRusosob/no Cp and no desat   - Try off ACEi 08/20/2018     Symptoms are markedly disproportionate to objective findings and not clear to what extent this is actually a pulmonary  problem but pt does appear to have difficult to sort out respiratory symptoms of unknown origin for which  DDX  = almost all start with A and  include Adherence, Ace Inhibitors, Acid Reflux, Active Sinus Disease, Alpha 1 Antitripsin deficiency, Anxiety masquerading as Airways dz,  ABPA,  Allergy(esp in young), Aspiration (esp in elderly), Adverse effects of meds,  Active smoking or Vaping, A bunch of PE's/clot burden (a few small clots can't cause this syndrome unless there is already severe underlying pulm or vascular dz with poor reserve),  Anemia or thyroid disorder, plus two Bs  = Bronchiectasis and Beta blocker use..and one C= CHF      ACEi adverse effects at the  top of the usual list of suspects and the only way to rule it out is a trial off > see a/p    ? Allergy/asthma > very unlikely s cough or variable noct flares (air always cuts off immediately when lie on back is not a variable noct symptom)  ? A bunch of PE's >  D dimer nl - while a normal  or high normal value (seen commonly in  the elderly or chronically ill)  may miss small peripheral pe, the clot burden with sob is moderately high and the d dimer  has a very high neg pred value if used in this setting.    ? Anemia/ thyroid dz ruled out today.  He has chronically low MCV but nl mchc c/w thal minor but that does not cause sob and thyroid disorder ruled out with nl tsh  ? CHF/diastolic dysfunction > unlikely with bnp so low/ neg cards w/u for IHD noted  09/20/17   I note his HC03 is on the low side at last eval and this could suggest met acidosis causing increased ventilatory demand but should be reproducible doe proportional to work load, which I did not get a hx to support.    Will start with trial off ACEi then return for f/u in 6 weeks if not better    Total time devoted to counseling  > 50 % of initial 60 min office visit:  review case with pt/directly observed portions of amb 02 sat study/ discussion of options/alternatives/ personally creating written customized instructions  in presence of pt  then going over those specific  Instructions directly with the pt including how to use all of the meds but in particular covering each new medication in detail and  the difference between the maintenance= "automatic" meds and the prns using an action plan format for the latter (If this problem/symptom => do that organization reading Left to right).  Please see AVS from this visit for a full list of these instructions which I personally wrote for this pt and  are unique to this visit.

## 2018-08-20 NOTE — Progress Notes (Signed)
**Note Daniel-Identified via Obfuscation** Daniel Perkins, male    DOB: 09-22-1955,   MRN: [id number]   Brief patient profile:  62 yobm quit smoking 07/06/2007 p foot surgery with wt around baseline around 200 lb and new variable doe starting 2018 with neg w/u for heart problems and nl pfts 10/2017   CPST  04/18/12  Conclusion: Exercise testing with gas exchange demonstrates a low normal functional capacity when compared to matched sedentary norms. At peak exercise there appears to be a mixed limitation driven mostly by the patient's obesity and related restrictive lung physiology. There was also a hypertensive response to exercise (236/102) and the elevated VE/VCO2 at peak likely reflects diastolic dysfunction.       History of Present Illness  08/20/2018  Pulmonary/ 1st office eval/Daniel Perkins / on ACEi  Chief Complaint  Patient presents with   Pulmonary Consult    Referred by [facility]- pt c/o SOB x 2 1/2 years- worse over the past 5 months. He states he gets winded walking up stairs or sometimes walking flat surface.    Dyspnea:  Highly variable but always with movement "sometimes  Up steps and sometimes down" but more often up, sometimes room to room / also slowed down by feet and legs cramping  Cough: none  Sleep: ok on side, can't lie on back air cuts off ever since neck surgery  SABA use: none  02 none  Cp's shoot across chest, last 20 seconds, always with sob but not note sob was long before cp reported and neither is proportionate to ex   No obvious day to day or daytime variability or assoc excess/ purulent sputum or mucus plugs or hemoptysis or  chest tightness, subjective wheeze or overt sinus or hb symptoms.   Sleeping  without nocturnal  or early am exacerbation  of respiratory  c/o's or need for noct saba. Also denies any obvious fluctuation of symptoms with weather or environmental changes or other aggravating or alleviating factors except as outlined above   No unusual exposure hx or h/o childhood pna/  asthma or knowledge of premature birth.  Current Allergies, Complete Past Medical History, Past Surgical History, Family History, and Social History were reviewed in [employer] record.  ROS  The following are not active complaints unless bolded Hoarseness, sore throat, dysphagia, dental problems, itching, sneezing,  nasal congestion or discharge of excess mucus or purulent secretions, ear ache,   fever, chills, sweats, unintended wt loss or wt gain, classically pleuritic or exertional cp,  orthopnea pnd or arm/hand swelling  or leg swelling, presyncope, palpitations, abdominal pain, anorexia, nausea, vomiting, diarrhea  or change in bowel habits or change in bladder habits, change in stools or change in urine, dysuria, hematuria,  rash, arthralgias, visual complaints, headache, numbness, weakness or ataxia or problems with walking or coordination,  change in mood or  memory.             Past Medical History:  Diagnosis Date   Club foot of both lower extremities 1958   Coronary artery disease    Holter monitor 02/2012 - sinus with rare PVC   CTEV (congenital talipes equinovarus)    Gout    Hyperlipidemia    Hypertension    Myocardial infarction [facility] ~ 2007   "mild"   Osteomyelitis ([place])    Pneumonia 2000's   "couple times"   Type II diabetes mellitus ([place]) 2011    Outpatient Medications Prior to Visit  Medication Sig Dispense Refill   acetaminophen (TYLENOL)  500 MG tablet Take 1,000 mg by mouth every 6 (six) hours as needed for moderate pain (gout).     allopurinol (ZYLOPRIM) 100 MG tablet Take 1 tablet (100 mg total) by mouth daily. 90 tablet 3   amLODipine (NORVASC) 5 MG tablet Take 1 tablet (5 mg total) by mouth daily. 90 tablet 3   aspirin EC 81 MG tablet Take 1 tablet (81 mg total) by mouth daily. 30 tablet 3   Blood Glucose Calibration (ONETOUCH VERIO) SOLN Use to check blood glucose as instructed. E11.9. 1 each 0   Blood Glucose  Monitoring Suppl (ONE TOUCH ULTRA 2) W/DEVICE KIT Check blood sugar twice daily as instructed dx code 250.00 1 each 0   carvedilol (COREG) 12.5 MG tablet Take 1 tablet (12.5 mg total) by mouth 2 (two) times daily. 180 tablet 1   colchicine 0.6 MG tablet TAKE 1 TABLET BY MOUTH  DAILY AS NEEDED FOR GOUT  FLARE 30 tablet 0   empagliflozin (JARDIANCE) 10 MG TABS tablet Take 10 mg by mouth daily. 90 tablet 1   glipiZIDE (GLUCOTROL) 10 MG tablet TAKE 1 TABLET BY MOUTH  TWICE A DAY BEFORE A MEAL 180 tablet 0   glucose blood (ONETOUCH VERIO) test strip UsE 1 TIME DAILY TO TEST BLOOD SUGAR. E11.9. Non-iNSULIN DEPENDENT 100 each 5   Insulin Pen Needle 31G X 5 MM MISC Use to inject liraglutide once daily as instructed Dx E11.9 100 each 5   Insulin Pen Needle 32G X 4 MM MISC Use to inject victoza one time a day 100 each 3   liraglutide (VICTOZA) 18 MG/3ML SOPN Inject 1.8 mg into the skin daily as instructed 3 pen 11   lisinopril (PRINIVIL,ZESTRIL) 20 MG tablet Take 1 tablet (20 mg total) by mouth daily. 90 tablet 3   nitroGLYCERIN (NITROSTAT) 0.4 MG SL tablet DISSOLVE 1 TABLET UNDER THE TONGUE EVERY 5 MINUTES AS  NEEDED FOR CHEST PAIN , MAX 3 TABLETS IN 15 MINUTES,  CALL 911 IF NO RELIEF. 25 tablet 1   ONETOUCH DELICA LANCETS FINE MISC Use to check blood sugar up to one time a day 100 each 12   rosuvastatin (CRESTOR) 20 MG tablet Take 1 tablet (20 mg total) by mouth daily. 90 tablet 3   albuterol (PROVENTIL HFA;VENTOLIN HFA) 108 (90 Base) MCG/ACT inhaler Inhale 2 puffs into the lungs every 6 (six) hours as needed for wheezing or shortness of breath. 1 Inhaler 0   cilostazol (PLETAL) 50 MG tablet Take 1 tablet (50 mg total) by mouth 2 (two) times daily. 180 tablet 3      Objective:     BP 132/80 (BP Location: Left Arm, Cuff Size: Normal)    Pulse 96    Ht '5\' 8"'  (1.727 m)    Wt 233 lb (105.7 kg)    SpO2 95%    BMI 35.43 kg/m   SpO2: 95 %   RA  Obese bm nad    HEENT: poor dentition, nl   turbinates bilaterally, and oropharynx. Nl external ear canals without cough reflex   NECK :  without JVD/Nodes/TM/ nl carotid upstrokes bilaterally   LUNGS: no acc muscle use,  Nl contour chest which is clear to A and P bilaterally without cough on insp or exp maneuvers   CV:  RRR  no s3 or murmur or increase in P2, and no edema   ABD:  Quite obese soft and nontender with nl inspiratory excursion in the supine position. No bruits or organomegaly  appreciated, bowel sounds nl  MS:  Nl gait/ ext warm without deformities, calf tenderness, cyanosis or clubbing No obvious joint restrictions   SKIN: warm and dry without lesions    NEURO:  alert, approp, nl sensorium with  no motor or cerebellar deficits apparent.      CXR PA and Lateral:   08/20/2018 :    I personally reviewed images and agree with radiology impression as follows:   No active cardiopulmonary disease.   Labs ordered/ reviewed:      Chemistry      Component Value Date/Time   NA 141 07/31/2018 1538   K 4.4 07/31/2018 1538   CL 103 07/31/2018 1538   CO2 19 (L) 07/31/2018 1538   BUN 18 07/31/2018 1538   CREATININE 1.15 07/31/2018 1538   CREATININE 1.22 06/30/2014 1634      Component Value Date/Time   CALCIUM 9.4 07/31/2018 1538   ALKPHOS 109 07/04/2017 0822   AST 15 07/04/2017 0822   ALT 16 07/04/2017 0822   BILITOT <0.2 07/04/2017 0822       BNP                            17.3               08/20/2018    Lab Results  Component Value Date   WBC 8.8 08/20/2018   HGB 13.3 08/20/2018   HCT 42.5 08/20/2018   MCV 70.4 (L) 08/20/2018   PLT 365 08/20/2018       EOS                                                               475                                    08/20/2018   Lab Results  Component Value Date   DDIMER 0.45 08/20/2018      Lab Results  Component Value Date   TSH 0.96 08/20/2018               Assessment   DOE (dyspnea on exertion) Onset 2013  CPST  04/18/12 '@wt'  238    Conclusion: Exercise testing with gas exchange demonstrates a low normal functional capacity when compared to matched sedentary norms. At peak exercise there appears to be a mixed limitation driven mostly by the patient's obesity and related restrictive lung physiology. There was also a hypertensive response to exercise (236/102) and the elevated VE/VCO2 at peak likely reflects diastolic dysfunction.  - PFT's  10/26/17 @ wt = 237  FEV1 2.68  (97 % ) ratio 0.86  p 0 % improvement from saba p ? prior to study with DLCO  50/52c % corrects to 83  % for alv volume  With variable truncation on insp loop on ACEi  - 08/20/2018   Walked RA  2 laps @  approx 272f each @ nl then fast pace  stopped due to  MCarmichaelsob/no Cp and no desat   - Try off ACEi 08/20/2018     Symptoms are markedly disproportionate to objective findings and not clear to what extent this is actually  a pulmonary  problem but pt does appear to have difficult to sort out respiratory symptoms of unknown origin for which  DDX  = almost all start with A and  include Adherence, Ace Inhibitors, Acid Reflux, Active Sinus Disease, Alpha 1 Antitripsin deficiency, Anxiety masquerading as Airways dz,  ABPA,  Allergy(esp in young), Aspiration (esp in elderly), Adverse effects of meds,  Active smoking or Vaping, A bunch of PE's/clot burden (a few small clots can't cause this syndrome unless there is already severe underlying pulm or vascular dz with poor reserve),  Anemia or thyroid disorder, plus two Bs  = Bronchiectasis and Beta blocker use..and one C= CHF      ACEi adverse effects at the  top of the usual list of suspects and the only way to rule it out is a trial off > see a/p    ? Allergy/asthma > very unlikely s cough or variable noct flares (air always cuts off immediately when lie on back is not a variable noct symptom)  ? A bunch of PE's >  D dimer nl - while a normal  or high normal value (seen commonly in the elderly or chronically ill)  may  miss small peripheral pe, the clot burden with sob is moderately high and the d dimer  has a very high neg pred value if used in this setting.    ? Anemia/ thyroid dz ruled out today.  He has chronically low MCV but nl mchc c/w thal minor but that does not cause sob and thyroid disorder ruled out with nl tsh  ? CHF/diastolic dysfunction > unlikely with bnp so low/ neg cards w/u for IHD noted  09/20/17   I note his HC03 is on the low side at last eval and this could suggest met acidosis causing increased ventilatory demand but should be reproducible doe proportional to work load, which I did not get a hx to support.    Will start with trial off ACEi then return for f/u in 6 weeks if not better      Obesity, Class II, BMI 35-39.9 Body mass index is 35.43 kg/m.    Lab Results  Component Value Date   TSH 0.96 08/20/2018     Contributing to gerd risk/ doe/reviewed the need and the process to achieve and maintain neg calorie balance > defer f/u primary care including intermittently monitoring thyroid status        Total time devoted to counseling  > 50 % of initial 60 min office visit:  review case with pt/directly observed portions of amb 02 sat study/ discussion of options/alternatives/ personally creating written customized instructions  in presence of pt  then going over those specific  Instructions directly with the pt including how to use all of the meds but in particular covering each new medication in detail and the difference between the maintenance= "automatic" meds and the prns using an action plan format for the latter (If this problem/symptom => do that organization reading Left to right).  Please see AVS from this visit for a full list of these instructions which I personally wrote for this pt and  are unique to this visit.    Christinia Gully, MD 08/20/2018

## 2018-08-20 NOTE — Patient Instructions (Addendum)
To get the most out of exercise, you need to be continuously aware that you are short of breath, but never out of breath, for 30 minutes daily. As you improve, it will actually be easier for you to do the same amount of exercise  in  30 minutes so always push to the level where you are short of breath.    Stop lisinopril and start diovan 160 mg one daily and your breathing should gradually improve over the next 4-6 weeks - if not better please call for additional follow up    Please remember to go to the lab and x-ray department   for your tests - we will call you with the results when they are available.

## 2018-08-21 ENCOUNTER — Encounter: Payer: Self-pay | Admitting: Internal Medicine

## 2018-08-21 LAB — CBC WITH DIFFERENTIAL/PLATELET
Absolute Monocytes: 994 cells/uL — ABNORMAL HIGH (ref 200–950)
Basophils Absolute: 53 cells/uL (ref 0–200)
Basophils Relative: 0.6 %
Eosinophils Absolute: 475 cells/uL (ref 15–500)
Eosinophils Relative: 5.4 %
HCT: 42.5 % (ref 38.5–50.0)
Hemoglobin: 13.3 g/dL (ref 13.2–17.1)
Lymphs Abs: 2658 cells/uL (ref 850–3900)
MCH: 22 pg — ABNORMAL LOW (ref 27.0–33.0)
MCHC: 31.3 g/dL — ABNORMAL LOW (ref 32.0–36.0)
MCV: 70.4 fL — ABNORMAL LOW (ref 80.0–100.0)
MPV: 9.9 fL (ref 7.5–12.5)
Monocytes Relative: 11.3 %
Neutro Abs: 4620 cells/uL (ref 1500–7800)
Neutrophils Relative %: 52.5 %
Platelets: 365 10*3/uL (ref 140–400)
RBC: 6.04 10*6/uL — ABNORMAL HIGH (ref 4.20–5.80)
RDW: 14.5 % (ref 11.0–15.0)
Total Lymphocyte: 30.2 %
WBC: 8.8 10*3/uL (ref 3.8–10.8)

## 2018-08-21 LAB — TSH: TSH: 0.96 mIU/L (ref 0.40–4.50)

## 2018-08-21 LAB — D-DIMER, QUANTITATIVE: D-Dimer, Quant: 0.45 mcg/mL FEU (ref <0.50)

## 2018-08-21 NOTE — Assessment & Plan Note (Signed)
Body mass index is 35.43 kg/m.   Lab Results  Component Value Date   TSH 0.96 08/20/2018     Contributing to gerd risk/ doe/reviewed the need and the process to achieve and maintain neg calorie balance > defer f/u primary care including intermittently monitoring thyroid status

## 2018-08-21 NOTE — Progress Notes (Signed)
Spoke with pt's father ok per DPR and notified of results

## 2018-08-21 NOTE — Progress Notes (Signed)
Spoke with the pt's father ok per DPR and notified of results

## 2018-09-19 ENCOUNTER — Other Ambulatory Visit: Payer: Self-pay

## 2018-09-19 NOTE — Telephone Encounter (Signed)
Called pt- informed he has refills for these meds; stated the last he was here, he was given samples. Informed pt to call [facility] pharmacy - stated he will.

## 2018-09-19 NOTE — Telephone Encounter (Signed)
empagliflozin (JARDIANCE) 10 MG TABS tablet,   amLODipine (NORVASC) 5 MG tablet   Refill request [id number] - [place], [place] - [address] [phone] (Phone) [phone] (Fax)

## 2018-09-21 ENCOUNTER — Telehealth: Payer: Self-pay | Admitting: Internal Medicine

## 2018-09-21 NOTE — Telephone Encounter (Signed)
Returned call to patient. Patient not at home. Left message with man who answered phone requesting return call. Kinnie Feil, RN, BSN

## 2018-09-21 NOTE — Telephone Encounter (Signed)
Pt is requesting a call back from nurse regarding medicine [phone]

## 2018-09-24 ENCOUNTER — Other Ambulatory Visit: Payer: Self-pay

## 2018-09-25 ENCOUNTER — Ambulatory Visit: Payer: Self-pay | Admitting: Podiatry

## 2018-10-02 ENCOUNTER — Other Ambulatory Visit: Payer: Self-pay

## 2018-10-02 ENCOUNTER — Ambulatory Visit: Payer: Medicare Other | Admitting: Internal Medicine

## 2018-10-02 NOTE — Progress Notes (Signed)
I called Daniel Perkins for our scheduled televisit. His father answered the phone and said Daniel Perkins wouldn't be available until late this evening. His father confirmed that the number I called is the only telephone number at which Daniel Perkins can be reached. I left our clinic number with his father. If Daniel Perkins calls to reschedule, he may be added to my next continuity clinic on 6/2.

## 2018-10-03 ENCOUNTER — Telehealth: Payer: Self-pay

## 2018-10-03 NOTE — Telephone Encounter (Signed)
Requesting to speak with a nurse about meds. Please call back.  

## 2018-10-03 NOTE — Telephone Encounter (Signed)
Return pt's call - stated he needs jardiance,amlodipine, and victoza refilled ; informed he has refills.  I called [facility] pharmacy - stated he does have refills and will be ready after 2 PM today.  Called pt - informed to pick up rxs after 2 PM. Pt asked about next appt - informed June 2 @ 1345 PM telehealth visit.

## 2018-10-09 ENCOUNTER — Ambulatory Visit: Payer: Medicare Other | Admitting: Internal Medicine

## 2018-10-09 ENCOUNTER — Telehealth: Payer: Self-pay | Admitting: *Deleted

## 2018-10-09 ENCOUNTER — Other Ambulatory Visit: Payer: Self-pay

## 2018-10-09 NOTE — Progress Notes (Signed)
I called Daniel Perkins for our scheduled telehealth visit. Once again his father answered and stated that Daniel Perkins wouldn't be home this afternoon. As Daniel Perkins only has access to this one home phone, he may benefit from an in-person [facility] visit or a visit with his new PCP in early June to discuss his blood pressure. If he is unable to do an in-person visit, he can be scheduled for my next televisit continuity clinic.

## 2018-10-09 NOTE — Telephone Encounter (Signed)
A message was left, re: follow up visit. 

## 2018-10-09 NOTE — Progress Notes (Signed)
I reviewed this patient with Dr. Avie Arenas.  Patient will need an in person visit.

## 2018-10-16 ENCOUNTER — Ambulatory Visit: Payer: Medicare Other | Admitting: Podiatry

## 2018-10-19 ENCOUNTER — Encounter (HOSPITAL_COMMUNITY): Payer: Self-pay | Admitting: Emergency Medicine

## 2018-10-19 ENCOUNTER — Emergency Department (HOSPITAL_COMMUNITY)
Admission: EM | Admit: 2018-10-19 | Discharge: 2018-10-19 | Disposition: A | Discharge status: Home / Self Care | Payer: Medicare Other | Attending: Emergency Medicine | Admitting: Emergency Medicine

## 2018-10-19 ENCOUNTER — Other Ambulatory Visit: Payer: Self-pay

## 2018-10-19 ENCOUNTER — Emergency Department (HOSPITAL_COMMUNITY): Payer: Medicare Other

## 2018-10-19 DIAGNOSIS — I252 Old myocardial infarction: Secondary | ICD-10-CM | POA: Insufficient documentation

## 2018-10-19 DIAGNOSIS — M25532 Pain in left wrist: Secondary | ICD-10-CM | POA: Diagnosis not present

## 2018-10-19 DIAGNOSIS — Y9389 Activity, other specified: Secondary | ICD-10-CM | POA: Diagnosis not present

## 2018-10-19 DIAGNOSIS — Y999 Unspecified external cause status: Secondary | ICD-10-CM | POA: Diagnosis not present

## 2018-10-19 DIAGNOSIS — W108XXA Fall (on) (from) other stairs and steps, initial encounter: Secondary | ICD-10-CM

## 2018-10-19 DIAGNOSIS — Z87891 Personal history of nicotine dependence: Secondary | ICD-10-CM | POA: Insufficient documentation

## 2018-10-19 DIAGNOSIS — Z794 Long term (current) use of insulin: Secondary | ICD-10-CM | POA: Diagnosis not present

## 2018-10-19 DIAGNOSIS — Y92008 Other place in unspecified non-institutional (private) residence as the place of occurrence of the external cause: Secondary | ICD-10-CM | POA: Diagnosis not present

## 2018-10-19 DIAGNOSIS — I2510 Atherosclerotic heart disease of native coronary artery without angina pectoris: Secondary | ICD-10-CM | POA: Diagnosis not present

## 2018-10-19 DIAGNOSIS — M25432 Effusion, left wrist: Secondary | ICD-10-CM

## 2018-10-19 DIAGNOSIS — Z79899 Other long term (current) drug therapy: Secondary | ICD-10-CM | POA: Insufficient documentation

## 2018-10-19 DIAGNOSIS — W109XXA Fall (on) (from) unspecified stairs and steps, initial encounter: Secondary | ICD-10-CM | POA: Insufficient documentation

## 2018-10-19 DIAGNOSIS — I10 Essential (primary) hypertension: Secondary | ICD-10-CM | POA: Diagnosis not present

## 2018-10-19 DIAGNOSIS — Z7982 Long term (current) use of aspirin: Secondary | ICD-10-CM | POA: Insufficient documentation

## 2018-10-19 DIAGNOSIS — E119 Type 2 diabetes mellitus without complications: Secondary | ICD-10-CM | POA: Diagnosis not present

## 2018-10-19 DIAGNOSIS — M7989 Other specified soft tissue disorders: Secondary | ICD-10-CM | POA: Diagnosis not present

## 2018-10-19 MED ORDER — OXYCODONE-ACETAMINOPHEN 5-325 MG PO TABS: 1.0000 | ORAL_TABLET | ORAL | 0 refills | Status: DC | PRN

## 2018-10-19 MED ORDER — PREDNISONE 20 MG PO TABS
40.0000 mg | ORAL_TABLET | Freq: Once | ORAL | Status: AC
  Administered 2018-10-19: 40 mg via ORAL
  Filled 2018-10-19: qty 2

## 2018-10-19 MED ORDER — PREDNISONE 20 MG PO TABS: ORAL_TABLET | ORAL | 0 refills | Status: DC

## 2018-10-19 NOTE — ED Provider Notes (Signed)
[facility] EMERGENCY DEPARTMENT Provider Note   CSN: [id number] Arrival date & time: 10/19/18  0246     History   Chief Complaint Chief Complaint  Patient presents with   Wrist Pain    HPI Daniel Perkins is a 63 y.o. male.   The history is provided by the patient.  Wrist Pain  He has history of hypertension, hyperlipidemia, coronary artery disease and comes in after injuring his left wrist in a fall at home.  He fell when he tripped going up steps 5 days ago.  He landed on his left wrist.  He did not seek treatment.  He reinjured the same wrist in the same fashion 2 days ago.  He is complaining of pain on the ulnar aspect of his left wrist.  He rates pain a 10/10.  He has been taking naproxen without relief.  He denies other injury.  Past Medical History:  Diagnosis Date   Club foot of both lower extremities 1958   Coronary artery disease    Holter monitor 02/2012 - sinus with rare PVC   CTEV (congenital talipes equinovarus)    Gout    Hyperlipidemia    Hypertension    Myocardial infarction ([place]) ~ 2007   "mild"   Osteomyelitis ([place])    Pneumonia 2000's   "couple times"   Type II diabetes mellitus ([place]) 2011    Patient Active Problem List   Diagnosis Date Noted   Ingrown toenail 06/06/2018   Rotator cuff tendonitis, left 02/14/2017   Toe cyanosis 02/14/2017   Skin lesion 03/23/2016   Pulsatile mass of left upper extremity 06/19/2015   Sleep apnea-like behavior 10/01/2014   Chest pain 09/24/2014   Peripheral vascular disease ([place]) 02/10/2014   Microcytic anemia 02/06/2013   CTEV (congenital talipes equinovarus)    Statin intolerance 06/29/2012   Esophageal reflux 06/29/2012   Health care maintenance 06/28/2012   Gout 06/12/2012   Obesity, Class II, BMI 35-39.9 02/20/2012   DOE (dyspnea on exertion) 02/20/2012   SOB (shortness of breath) 08/18/2010   Diabetes ([place]) 09/06/2009   ERECTILE DYSFUNCTION,  NON-ORGANIC 02/16/2009   Hyperlipemia 01/14/2009   HYPERTENSION 01/14/2009    Past Surgical History:  Procedure Laterality Date   ANTERIOR CERVICAL DECOMP/DISCECTOMY FUSION  X 2   CARDIAC CATHETERIZATION     FOOT FRACTURE SURGERY Right 1990's   "pt a steel plate in"   FOOT SURGERY Left x16   "joints collapsed; keep getting infected"   POSTERIOR CERVICAL FUSION/FORAMINOTOMY  X 2        Home Medications    Prior to Admission medications   Medication Sig Start Date End Date Taking? Authorizing Provider  acetaminophen (TYLENOL) 500 MG tablet Take 1,000 mg by mouth every 6 (six) hours as needed for moderate pain (gout).    [provider]  albuterol (VENTOLIN HFA) 108 (90 Base) MCG/ACT inhaler INHALE 2 PUFFS BY MOUTH EVERY 6 HOURS AS NEEDED FOR WHEEZING FOR SHORTNESS OF BREATH 08/21/18   [provider]  allopurinol (ZYLOPRIM) 100 MG tablet Take 1 tablet (100 mg total) by mouth daily. 03/06/18   Dorrell, Andree Elk, MD  amLODipine (NORVASC) 5 MG tablet Take 1 tablet (5 mg total) by mouth daily. 03/06/18 03/06/19  Dorrell, Andree Elk, MD  aspirin EC 81 MG tablet Take 1 tablet (81 mg total) by mouth daily. 03/19/15   Burgess Estelle, MD  Blood Glucose Calibration (ONETOUCH VERIO) SOLN Use to check blood glucose as instructed. E11.9. 03/08/17  Shela Leff, MD  Blood Glucose Monitoring Suppl (ONE TOUCH ULTRA 2) W/DEVICE KIT Check blood sugar twice daily as instructed dx code 250.00 07/10/13   Jessee Avers, MD  carvedilol (COREG) 12.5 MG tablet Take 1 tablet (12.5 mg total) by mouth 2 (two) times daily. 07/31/18   Dorrell, Andree Elk, MD  colchicine 0.6 MG tablet TAKE 1 TABLET BY MOUTH  DAILY AS NEEDED FOR GOUT  FLARE 04/24/17   Shela Leff, MD  empagliflozin (JARDIANCE) 10 MG TABS tablet Take 10 mg by mouth daily. 06/05/18   Dorrell, Andree Elk, MD  glipiZIDE (GLUCOTROL) 10 MG tablet TAKE 1 TABLET BY MOUTH  TWICE A DAY BEFORE A MEAL 07/31/18   Dorrell, Andree Elk, MD  glucose blood (ONETOUCH VERIO) test strip UsE 1 TIME DAILY TO TEST BLOOD SUGAR. E11.9. Non-iNSULIN DEPENDENT 11/15/17   Axel Filler, MD  Insulin Pen Needle 31G X 5 MM MISC Use to inject liraglutide once daily as instructed Dx E11.9 03/08/17   Shela Leff, MD  Insulin Pen Needle 32G X 4 MM MISC Use to inject victoza one time a day 11/15/17   Axel Filler, MD  liraglutide (VICTOZA) 18 MG/3ML SOPN Inject 1.8 mg into the skin daily as instructed 06/05/18   Dorrell, Andree Elk, MD  nitroGLYCERIN (NITROSTAT) 0.4 MG SL tablet DISSOLVE 1 TABLET UNDER THE TONGUE EVERY 5 MINUTES AS  NEEDED FOR CHEST PAIN , MAX 3 TABLETS IN 15 MINUTES,  CALL 911 IF NO RELIEF. 06/26/18   Wellington Hampshire, MD  [facility] DELICA LANCETS FINE MISC Use to check blood sugar up to one time a day 11/15/17   Axel Filler, MD  rosuvastatin (CRESTOR) 20 MG tablet Take 1 tablet (20 mg total) by mouth daily. 06/26/18 09/24/18  Wellington Hampshire, MD  valsartan (DIOVAN) 160 MG tablet Take 1 tablet (160 mg total) by mouth daily. 08/20/18   Tanda Rockers, MD    Family History Family History  Problem Relation Age of Onset   Diabetes Mother    Coronary artery disease Mother        HAS PACEMAKER   Heart disease Mother    Heart attack Brother    Lung disease Neg Hx     Social History Social History   Tobacco Use   Smoking status: Former Smoker    Packs/day: 1.00    Years: 32.00    Pack years: 32.00    Types: Cigarettes    Quit date: 07/06/2007    Years since quitting: 11.2   Smokeless tobacco: Never Used  Substance Use Topics   Alcohol use: Yes    Alcohol/week: 0.0 standard drinks    Comment: Sometimes.   Drug use: No     Allergies   Morphine and Pravastatin sodium   Review of Systems Review of Systems  All other systems reviewed and are negative.    Physical Exam Updated Vital Signs BP (!) 143/120 (BP Location: Right Arm)    Pulse 94    Temp 98.1 F (36.7 C)  (Oral)    Resp 17    SpO2 95%   Physical Exam Vitals signs and nursing note reviewed.    63 year old male, resting comfortably and in no acute distress. Vital signs are significant for elevated blood pressure. Oxygen saturation is 95%, which is normal. Head is normocephalic and atraumatic. PERRLA, EOMI. Oropharynx is clear. Neck is nontender and supple without adenopathy or JVD. Back is nontender and there is no CVA tenderness.  Lungs are clear without rales, wheezes, or rhonchi. Chest is nontender. Heart has regular rate and rhythm without murmur. Abdomen is soft, flat, nontender without masses or hepatosplenomegaly and peristalsis is normoactive. Extremities: There is mild swelling of the left wrist.  There is marked tenderness over the distal left ulna and the ulnar aspect of the wrist joint.  There is no tenderness to palpation over the anatomic snuffbox and there is no pain with axial loading of the thumb.  Distal neurovascular exam is intact with normal sensation and prompt capillary refill.  Remainder of extremity exam is unremarkable. Skin is warm and dry without rash. Neurologic: Mental status is normal, cranial nerves are intact, there are no motor or sensory deficits.  ED Treatments / Results   Radiology Dg Wrist Complete Left  Result Date: 10/19/2018 CLINICAL DATA:  63 year old male with recent falls. Pain and swelling. EXAM: LEFT WRIST - COMPLETE 3+ VIEW COMPARISON:  Left wrist series 09/28/2015. FINDINGS: Calcified peripheral vascular disease. Stable distal radius and ulna. Radiocarpal joint space loss and degenerative spurring. Carpal bone alignment is stable and within normal limits. Scaphoid intact. Visible metacarpals appear intact. No acute osseous abnormality identified. No discrete soft tissue injury. IMPRESSION: 1. Stable since 2017.  No acute osseous abnormality identified. 2. Calcified peripheral vascular disease. Electronically Signed   By: Genevie Ann M.D.   On: 10/19/2018  03:22    Procedures Procedures   Medications Ordered in ED Medications  predniSONE (DELTASONE) tablet 40 mg (has no administration in time range)     Initial Impression / Assessment and Plan / ED Course  I have reviewed the triage vital signs and the nursing notes.  Pertinent labs & imaging results that were available during my care of the patient were reviewed by me and considered in my medical decision making (see chart for details).  Fall with injury to left wrist.  He will be sent for x-rays.  Old records are reviewed, and it is noted that he has had ED visits for gout of the left wrist.   X-rays show no evidence of fracture.  Perhaps this is actually a flareup of gout.  He is placed in a wrist splint for comfort and given prescription for prednisone and also a small number of oxycodone-acetaminophen.  Advised to monitor his blood glucose carefully at home.  Also, with blood pressure elevated in the ED, advised to monitor his blood pressure at home.  Follow-up with PCP in 1 week.  Final Clinical Impressions(s) / ED Diagnoses   Final diagnoses:  Pain and swelling of left wrist  Fall (on) (from) other stairs and steps, initial encounter  Elevated blood pressure reading with diagnosis of hypertension    ED Discharge Orders         Ordered    predniSONE (DELTASONE) 20 MG tablet     10/19/18 0346    oxyCODONE-acetaminophen (PERCOCET) 5-325 MG tablet  Every 4 hours PRN     10/19/18 8527           Delora Fuel, MD 78/24/23 [phone]

## 2018-10-19 NOTE — ED Notes (Signed)
Patient transported to X-ray 

## 2018-10-19 NOTE — Discharge Instructions (Signed)
Wear the splint as needed.  Apply ice for thirty minutes at a time, four times a day.  Continue taking naproxen. Take oxycodone-acetaminophen only for severe pain.  Monitor your blood sugars carefully - they may go up while you are taking prednisone.  Monitor your blood pressure at home.

## 2018-10-19 NOTE — ED Triage Notes (Signed)
Pt c/o left wrist pain and swelling after a mechanical fall. Denies hitting his head/LOC.

## 2018-10-21 ENCOUNTER — Emergency Department (HOSPITAL_COMMUNITY)
Admission: EM | Admit: 2018-10-21 | Discharge: 2018-10-21 | Disposition: A | Discharge status: Home / Self Care | Payer: Medicare Other | Attending: Emergency Medicine | Admitting: Emergency Medicine

## 2018-10-21 ENCOUNTER — Encounter (HOSPITAL_COMMUNITY): Payer: Self-pay

## 2018-10-21 DIAGNOSIS — Z79899 Other long term (current) drug therapy: Secondary | ICD-10-CM | POA: Diagnosis not present

## 2018-10-21 DIAGNOSIS — Z794 Long term (current) use of insulin: Secondary | ICD-10-CM | POA: Insufficient documentation

## 2018-10-21 DIAGNOSIS — Z87891 Personal history of nicotine dependence: Secondary | ICD-10-CM | POA: Insufficient documentation

## 2018-10-21 DIAGNOSIS — I252 Old myocardial infarction: Secondary | ICD-10-CM | POA: Insufficient documentation

## 2018-10-21 DIAGNOSIS — E1151 Type 2 diabetes mellitus with diabetic peripheral angiopathy without gangrene: Secondary | ICD-10-CM | POA: Insufficient documentation

## 2018-10-21 DIAGNOSIS — I10 Essential (primary) hypertension: Secondary | ICD-10-CM | POA: Diagnosis not present

## 2018-10-21 DIAGNOSIS — I2510 Atherosclerotic heart disease of native coronary artery without angina pectoris: Secondary | ICD-10-CM | POA: Diagnosis not present

## 2018-10-21 DIAGNOSIS — Z7982 Long term (current) use of aspirin: Secondary | ICD-10-CM | POA: Insufficient documentation

## 2018-10-21 DIAGNOSIS — M109 Gout, unspecified: Secondary | ICD-10-CM

## 2018-10-21 MED ORDER — PREDNISONE 20 MG PO TABS
40.0000 mg | ORAL_TABLET | Freq: Once | ORAL | Status: AC
  Administered 2018-10-21: 40 mg via ORAL
  Filled 2018-10-21: qty 2

## 2018-10-21 MED ORDER — PREDNISONE 10 MG PO TABS: 20.0000 mg | ORAL_TABLET | Freq: Every day | ORAL | 0 refills | Status: AC

## 2018-10-21 MED ORDER — KETOROLAC TROMETHAMINE 30 MG/ML IJ SOLN
15.0000 mg | Freq: Once | INTRAMUSCULAR | Status: AC
  Administered 2018-10-21: 15 mg via INTRAMUSCULAR
  Filled 2018-10-21: qty 1

## 2018-10-21 NOTE — Discharge Instructions (Signed)
Please pick up Prednisone at your pharmacy; tell them you only want the Prednisone and not the narcotic pain medication that was prescribed to you 2 days ago. I have attached a coupon for you to take to the pharmacy. Please follow up with your PCP regarding your gout.

## 2018-10-21 NOTE — ED Provider Notes (Signed)
[facility] EMERGENCY DEPARTMENT Provider Note   CSN: [id number] Arrival date & time: 10/21/18  2023    History   Chief Complaint Chief Complaint  Patient presents with  . Gout  . Arm Pain    HPI Daniel Perkins is a 63 y.o. male who presents to the ED complaining of unchanged pain to his left wrist. Pt was seen in the ED on 06/12 for left wrist pain after an injury where he fell onto his outstretched wrist. He had an xray done which was negative; pt was discharged home with the provider believing pt was actually experiencing symptoms of gout. He was sent home with prednisone and narcotic pain medication; pt reports his dad went to pick up the medications but it was too expensive for them so the did not pick it up. He has been taking his Allopurinol without relief but does endorse he ran out of it recently and just went back on it. Denies fever, chills, redness, or any other associated symptoms.        Past Medical History:  Diagnosis Date  . Club foot of both lower extremities 1958  . Coronary artery disease    Holter monitor 02/2012 - sinus with rare PVC  . CTEV (congenital talipes equinovarus)   . Gout   . Hyperlipidemia   . Hypertension   . Myocardial infarction [facility]) ~ 2007   "mild"  . Osteomyelitis ([place])   . Pneumonia 2000's   "couple times"  . Type II diabetes mellitus ([place]) 2011    Patient Active Problem List   Diagnosis Date Noted  . Ingrown toenail 06/06/2018  . Rotator cuff tendonitis, left 02/14/2017  . Toe cyanosis 02/14/2017  . Skin lesion 03/23/2016  . Pulsatile mass of left upper extremity 06/19/2015  . Sleep apnea-like behavior 10/01/2014  . Chest pain 09/24/2014  . Peripheral vascular disease ([place]) 02/10/2014  . Microcytic anemia 02/06/2013  . CTEV (congenital talipes equinovarus)   . Statin intolerance 06/29/2012  . Esophageal reflux 06/29/2012  . Health care maintenance 06/28/2012  . Gout 06/12/2012  . Obesity, Class II,  BMI 35-39.9 02/20/2012  . DOE (dyspnea on exertion) 02/20/2012  . SOB (shortness of breath) 08/18/2010  . Diabetes ([place]) 09/06/2009  . ERECTILE DYSFUNCTION, NON-ORGANIC 02/16/2009  . Hyperlipemia 01/14/2009  . HYPERTENSION 01/14/2009    Past Surgical History:  Procedure Laterality Date  . ANTERIOR CERVICAL DECOMP/DISCECTOMY FUSION  X 2  . CARDIAC CATHETERIZATION    . FOOT FRACTURE SURGERY Right 1990's   "pt a steel plate in"  . FOOT SURGERY Left x16   "joints collapsed; keep getting infected"  . POSTERIOR CERVICAL FUSION/FORAMINOTOMY  X 2        Home Medications    Prior to Admission medications   Medication Sig Start Date End Date Taking? Authorizing Provider  acetaminophen (TYLENOL) 500 MG tablet Take 1,000 mg by mouth every 6 (six) hours as needed for moderate pain (gout).    [provider]  albuterol (VENTOLIN HFA) 108 (90 Base) MCG/ACT inhaler INHALE 2 PUFFS BY MOUTH EVERY 6 HOURS AS NEEDED FOR WHEEZING FOR SHORTNESS OF BREATH 08/21/18   [provider]  allopurinol (ZYLOPRIM) 100 MG tablet Take 1 tablet (100 mg total) by mouth daily. 03/06/18   Dorrell, Andree Elk, MD  amLODipine (NORVASC) 5 MG tablet Take 1 tablet (5 mg total) by mouth daily. 03/06/18 03/06/19  Dorrell, Andree Elk, MD  aspirin EC 81 MG tablet Take 1 tablet (81 mg total) by  mouth daily. 03/19/15   Burgess Estelle, MD  Blood Glucose Calibration (ONETOUCH VERIO) SOLN Use to check blood glucose as instructed. E11.9. 03/08/17   Shela Leff, MD  Blood Glucose Monitoring Suppl (ONE TOUCH ULTRA 2) W/DEVICE KIT Check blood sugar twice daily as instructed dx code 250.00 07/10/13   Jessee Avers, MD  carvedilol (COREG) 12.5 MG tablet Take 1 tablet (12.5 mg total) by mouth 2 (two) times daily. 07/31/18   Dorrell, Andree Elk, MD  colchicine 0.6 MG tablet TAKE 1 TABLET BY MOUTH  DAILY AS NEEDED FOR GOUT  FLARE 04/24/17   Shela Leff, MD  empagliflozin (JARDIANCE) 10 MG TABS tablet Take 10 mg  by mouth daily. 06/05/18   Dorrell, Andree Elk, MD  glipiZIDE (GLUCOTROL) 10 MG tablet TAKE 1 TABLET BY MOUTH  TWICE A DAY BEFORE A MEAL 07/31/18   Dorrell, Andree Elk, MD  glucose blood (ONETOUCH VERIO) test strip UsE 1 TIME DAILY TO TEST BLOOD SUGAR. E11.9. Non-iNSULIN DEPENDENT 11/15/17   Axel Filler, MD  Insulin Pen Needle 31G X 5 MM MISC Use to inject liraglutide once daily as instructed Dx E11.9 03/08/17   Shela Leff, MD  Insulin Pen Needle 32G X 4 MM MISC Use to inject victoza one time a day 11/15/17   Axel Filler, MD  liraglutide (VICTOZA) 18 MG/3ML SOPN Inject 1.8 mg into the skin daily as instructed 06/05/18   Dorrell, Andree Elk, MD  nitroGLYCERIN (NITROSTAT) 0.4 MG SL tablet DISSOLVE 1 TABLET UNDER THE TONGUE EVERY 5 MINUTES AS  NEEDED FOR CHEST PAIN , MAX 3 TABLETS IN 15 MINUTES,  CALL 911 IF NO RELIEF. 06/26/18   Wellington Hampshire, MD  [facility] DELICA LANCETS FINE MISC Use to check blood sugar up to one time a day 11/15/17   Axel Filler, MD  oxyCODONE-acetaminophen (PERCOCET) 5-325 MG tablet Take 1 tablet by mouth every 4 (four) hours as needed for moderate pain. 11/08/48   Delora Fuel, MD  predniSONE (DELTASONE) 10 MG tablet Take 2 tablets (20 mg total) by mouth daily for 4 days. 10/21/18 10/25/18  Eustaquio Maize, PA-C  predniSONE (DELTASONE) 20 MG tablet 2 tabs po daily x 4 days 0/93/81   Delora Fuel, MD  rosuvastatin (CRESTOR) 20 MG tablet Take 1 tablet (20 mg total) by mouth daily. 06/26/18 09/24/18  Wellington Hampshire, MD  valsartan (DIOVAN) 160 MG tablet Take 1 tablet (160 mg total) by mouth daily. 08/20/18   Tanda Rockers, MD    Family History Family History  Problem Relation Age of Onset  . Diabetes Mother   . Coronary artery disease Mother        HAS PACEMAKER  . Heart disease Mother   . Heart attack Brother   . Lung disease Neg Hx     Social History Social History   Tobacco Use  . Smoking status: Former Smoker    Packs/day: 1.00     Years: 32.00    Pack years: 32.00    Types: Cigarettes    Quit date: 07/06/2007    Years since quitting: 11.3  . Smokeless tobacco: Never Used  Substance Use Topics  . Alcohol use: Yes    Alcohol/week: 0.0 standard drinks    Comment: Sometimes.  . Drug use: No     Allergies   Morphine and Pravastatin sodium   Review of Systems Review of Systems  Constitutional: Negative for chills and fever.  Musculoskeletal: Positive for arthralgias.  Skin: Negative for color change and  wound.     Physical Exam Updated Vital Signs BP (!) 135/98   Pulse 92   Temp 98.4 F (36.9 C) (Oral)   Resp 20   SpO2 97%   Physical Exam Vitals signs and nursing note reviewed.  Constitutional:      Appearance: He is not ill-appearing.  HENT:     Head: Normocephalic and atraumatic.  Eyes:     Conjunctiva/sclera: Conjunctivae normal.  Cardiovascular:     Rate and Rhythm: Normal rate and regular rhythm.  Pulmonary:     Effort: Pulmonary effort is normal.     Breath sounds: Normal breath sounds.  Musculoskeletal:     Comments: Mild swelling to left wrist with TTP along ulnar aspect; no snuffbox tenderness on exam; no tenderness to metacarpals or phalanges; no TTP to left elbow. No erythema or streaking. No wound. 2+ radial pulses.   Skin:    General: Skin is warm and dry.     Coloration: Skin is not jaundiced.  Neurological:     Mental Status: He is alert.      ED Treatments / Results  Labs (all labs ordered are listed, but only abnormal results are displayed) Labs Reviewed - No data to display  EKG None  Radiology No results found.  Procedures Procedures (including critical care time)  Medications Ordered in ED Medications  predniSONE (DELTASONE) tablet 40 mg (40 mg Oral Given 10/21/18 2153)  ketorolac (TORADOL) 30 MG/ML injection 15 mg (15 mg Intramuscular Given 10/21/18 2153)     Initial Impression / Assessment and Plan / ED Course  I have reviewed the triage vital signs  and the nursing notes.  Pertinent labs & imaging results that were available during my care of the patient were reviewed by me and considered in my medical decision making (see chart for details).    Pt is a 63 year old male who presents to the ED with unchanged pain. Seen in the ED 2 days ago for left wrist pain; diagnosed with gout after negative x ray of wrist and sent home with prednisone and percocet. Pt unable to afford medications so he did not pick them up; reports his dad went to the pharmacy; unsure what the price was. Pt reports he has been taking Allopurinol without relief. He does have a hx of gout in his wrist and states this feels similar; he would just like the pain to go away.   Looked up prednisone on good rx; it appears it is $5.32 at [facility]; believe price may have been elevated due to narcotic medication. PMDP reviewed; no narcotics were picked up. Pt does not have any signs of cellulitis to suggest infection and no anatomical snuff box; do not feel pt needs repeat imaging at this time as it likely will not show a change. Will give IM toradol in the ED as well as first dose of prednisone. Will print pt good Rx coupon for Prednisone; advised to pick up only prednisone at the pharmacy and not the narcotic with it as this was likely causing it to be too high in price. Pt to follow up with his PCP. He is in agreement with plan at this time and stable for discharge home.        Final Clinical Impressions(s) / ED Diagnoses   Final diagnoses:  Acute gout of left wrist, unspecified cause    ED Discharge Orders         Ordered    predniSONE (DELTASONE) 10 MG  tablet  Daily     10/21/18 2153           Eustaquio Maize, PA-C 10/21/18 2318    Drenda Freeze, MD 10/24/18 [phone]

## 2018-10-21 NOTE — ED Triage Notes (Signed)
Pt states that he was here a few days ago and diagnosed with gout, taking meds without relief. Pain now radiates up arm.

## 2018-10-21 NOTE — ED Notes (Signed)
PT states understanding of care given, follow up care, and medication prescribed. PT ambulated from ED to car with a steady gait. 

## 2018-10-28 ENCOUNTER — Encounter: Payer: Self-pay | Admitting: *Deleted

## 2018-12-06 ENCOUNTER — Encounter: Payer: Self-pay | Admitting: Internal Medicine

## 2018-12-06 ENCOUNTER — Ambulatory Visit (INDEPENDENT_AMBULATORY_CARE_PROVIDER_SITE_OTHER): Payer: Medicare Other | Admitting: Internal Medicine

## 2018-12-06 ENCOUNTER — Encounter (INDEPENDENT_AMBULATORY_CARE_PROVIDER_SITE_OTHER): Payer: Self-pay

## 2018-12-06 ENCOUNTER — Other Ambulatory Visit: Payer: Self-pay

## 2018-12-06 VITALS — BP 126/81 | HR 97 | Temp 98.0°F | Ht 68.0 in | Wt 235.8 lb

## 2018-12-06 DIAGNOSIS — E785 Hyperlipidemia, unspecified: Secondary | ICD-10-CM

## 2018-12-06 DIAGNOSIS — E119 Type 2 diabetes mellitus without complications: Secondary | ICD-10-CM | POA: Diagnosis not present

## 2018-12-06 DIAGNOSIS — I10 Essential (primary) hypertension: Secondary | ICD-10-CM

## 2018-12-06 DIAGNOSIS — R350 Frequency of micturition: Secondary | ICD-10-CM | POA: Diagnosis not present

## 2018-12-06 DIAGNOSIS — F528 Other sexual dysfunction not due to a substance or known physiological condition: Secondary | ICD-10-CM

## 2018-12-06 DIAGNOSIS — N401 Enlarged prostate with lower urinary tract symptoms: Secondary | ICD-10-CM | POA: Diagnosis not present

## 2018-12-06 DIAGNOSIS — M10042 Idiopathic gout, left hand: Secondary | ICD-10-CM | POA: Diagnosis not present

## 2018-12-06 DIAGNOSIS — R351 Nocturia: Secondary | ICD-10-CM

## 2018-12-06 DIAGNOSIS — N400 Enlarged prostate without lower urinary tract symptoms: Secondary | ICD-10-CM

## 2018-12-06 DIAGNOSIS — N529 Male erectile dysfunction, unspecified: Secondary | ICD-10-CM | POA: Diagnosis not present

## 2018-12-06 DIAGNOSIS — Z79899 Other long term (current) drug therapy: Secondary | ICD-10-CM

## 2018-12-06 DIAGNOSIS — Z7984 Long term (current) use of oral hypoglycemic drugs: Secondary | ICD-10-CM

## 2018-12-06 LAB — POCT URINALYSIS DIPSTICK
Bilirubin, UA: NEGATIVE
Blood, UA: NEGATIVE
Glucose, UA: POSITIVE — AB
Leukocytes, UA: NEGATIVE
Nitrite, UA: NEGATIVE
Protein, UA: NEGATIVE
Spec Grav, UA: 1.02 (ref 1.010–1.025)
Urobilinogen, UA: 2 E.U./dL — AB
pH, UA: 5.5 (ref 5.0–8.0)

## 2018-12-06 LAB — POCT GLYCOSYLATED HEMOGLOBIN (HGB A1C): Hemoglobin A1C: 7.4 % — AB (ref 4.0–5.6)

## 2018-12-06 LAB — GLUCOSE, CAPILLARY: Glucose-Capillary: 97 mg/dL (ref 70–99)

## 2018-12-06 MED ORDER — COLCHICINE 0.6 MG PO TABS: ORAL_TABLET | ORAL | 0 refills | Status: DC

## 2018-12-06 MED ORDER — TAMSULOSIN HCL 0.4 MG PO CAPS: 0.4000 mg | ORAL_CAPSULE | Freq: Every day | ORAL | 0 refills | Status: DC

## 2018-12-06 NOTE — Assessment & Plan Note (Signed)
Patient reports 1 year of sexual dysfunction symptoms in which he is unable to maintain an erection. He reports that Viagra is too expensive and he would like other pharmacologic treatment. However, given that he is on nitroglycerin 0.4mg  as needed for chest pain, advised patient of adverse outcome. Will check testosterone level at this visit.  - Continue to monitor

## 2018-12-06 NOTE — Assessment & Plan Note (Addendum)
BP 126/81 at this visit. Patient is on amlodipine 5mg  qd, carvedilol 12.5mg  bid and valsartan 160mg  qd. He was previously on lisinopril but was discontinued by pulmonologist due to shortness of breath.  Patient does not regularly check BP at home but denies any headache, dizziness, or peripheral edema. He reports improvements in shortness of breath from previous.   - Continue current regimen  - F/u in 3 months for BP monitoring

## 2018-12-06 NOTE — Assessment & Plan Note (Signed)
Patient on rosuvastatin 20mg  daily. Tolerating well.   - Lipid panel today

## 2018-12-06 NOTE — Patient Instructions (Addendum)
Mr. Federici,   It was a pleasure seeing you in clinic today.   Please continue taking your medications as prescribed.  Today we will check some blood work and I will inform you of the results.   For your urinary symptoms, we did a ultrasound of your prostate and found that your prostate is enlarged. Please start taking Flomax and follow up with urology.   Please contact us if you have any concerns.  Thank you!

## 2018-12-06 NOTE — Assessment & Plan Note (Addendum)
Patient with increased urinary frequency for six months. He reports 6-7 episodes of nocturia per night with increased urgency. Patient reports that many times he is unable to make it to the bathroom and has to urinate in a bucket next to his bed. He also reports frequent episodes of urination throughout the day with associated urgency. He reports that there are several times when he has stopped and restarted urinating within the same episode.  He denies any changes in his urine stream, difficulty in starting or stopping urination, dysuria or hematuria.  Prostate examined via POCUS and was 4x4.6x3cm in size with volume of 79mL. No obstruction noted on [place].  IPSS score 14-17  - Flomax 0.4mg  daily  - PSA level check - Referral to urology for further management of hypertrophy and ED

## 2018-12-06 NOTE — Assessment & Plan Note (Addendum)
HbA1c 7.4 today. Patient is on Jardiance 10mg  daily, Glipizide 10mg  bid and Victoza 1.8mg  daily. Patient does not check his blood sugars at home. He reports to increased sugar intake in form of soft drinks. He also reports that he has been having increased urinary frequency which could be result of his hyperglycemia. UA positive for glucosuria. Denies any sensory changes.   - Continue current regimen - Encourage patient to cut down on soft drinks and sugar intake - BMP, CBC today  - Repeat A1c in 3 months

## 2018-12-06 NOTE — Progress Notes (Signed)
   CC: hypertension and diabetes follow up   HPI:  [name] is a 63 y.o. male with PMhx as listed below presenting to clinic for follow up of hypertension and diabetes. Today, he is also concerned about nocturia and increased urinary frequency and urgency for past 6 months as well as sexual dysfunction for the past year. He denies any dysuria, urinary hesitancy, penile discharge, fever, or chills.   Past Medical History:  Diagnosis Date  . Club foot of both lower extremities 1958  . Coronary artery disease    Holter monitor 02/2012 - sinus with rare PVC  . CTEV (congenital talipes equinovarus)   . Gout   . Hyperlipidemia   . Hypertension   . Myocardial infarction [facility]) ~ 2007   "mild"  . Osteomyelitis ([place])   . Pneumonia 2000's   "couple times"  . Type II diabetes mellitus ([place]) 2011   Review of Systems:  Review of Systems  Constitutional: Negative for chills, fever, malaise/fatigue and weight loss.  Eyes: Negative for blurred vision, double vision, photophobia and pain.  Respiratory: Negative for cough, shortness of breath and wheezing.   Cardiovascular: Negative for chest pain, palpitations, claudication and leg swelling.  Gastrointestinal: Negative for abdominal pain, constipation, diarrhea, melena, nausea and vomiting.  Genitourinary: Positive for frequency and urgency. Negative for dysuria, flank pain and hematuria.       +Trouble maintaining erection   Musculoskeletal: Negative for back pain and myalgias.  Neurological: Negative for dizziness, tingling, sensory change, focal weakness and headaches.  Endo/Heme/Allergies: Does not bruise/bleed easily.     Physical Exam:  Vitals:   12/06/18 1539  BP: 126/81  Pulse: 97  Temp: 98 F (36.7 C)  TempSrc: Oral  SpO2: 98%  Weight: 235 lb 12.8 oz (107 kg)  Height: 5\' 8"  (1.727 m)   Physical Exam  Constitutional: He is well-developed, well-nourished, and in no distress.  HENT:  Head: Normocephalic and  atraumatic.  Cardiovascular: Normal rate, regular rhythm, normal heart sounds and intact distal pulses. Exam reveals no gallop and no friction rub.  No murmur heard. Pulmonary/Chest: Effort normal and breath sounds normal. No respiratory distress. He has no wheezes. He has no rales.  Abdominal: Soft. Bowel sounds are normal. He exhibits no distension and no mass. There is no abdominal tenderness.  Genitourinary: Prostate is enlarged. Prostate is not tender.    Genitourinary Comments: Prostate evaluated with POCUS:  Size - 4x4.6x3cm; Volume - 28cm3 No obvious obstruction of bladder noted.    Musculoskeletal: Normal range of motion.        General: No tenderness, deformity or edema.  Skin: Skin is warm and dry.     Assessment & Plan:   See Encounters Tab for problem based charting.  Patient seen with Dr. Dareen Piano

## 2018-12-07 LAB — LIPID PANEL
Chol/HDL Ratio: 4 ratio (ref 0.0–5.0)
Cholesterol, Total: 119 mg/dL (ref 100–199)
HDL: 30 mg/dL — ABNORMAL LOW (ref >39)
LDL Calculated: 33 mg/dL (ref 0–99)
Triglycerides: 278 mg/dL — ABNORMAL HIGH (ref 0–149)
VLDL Cholesterol Cal: 56 mg/dL — ABNORMAL HIGH (ref 5–40)

## 2018-12-07 LAB — CMP14 + ANION GAP
ALT: 16 IU/L (ref 0–44)
AST: 24 IU/L (ref 0–40)
Albumin/Globulin Ratio: 1.4 (ref 1.2–2.2)
Albumin: 4.2 g/dL (ref 3.8–4.8)
Alkaline Phosphatase: 105 IU/L (ref 39–117)
Anion Gap: 18 mmol/L (ref 10.0–18.0)
BUN/Creatinine Ratio: 13 (ref 10–24)
BUN: 14 mg/dL (ref 8–27)
Bilirubin Total: 0.3 mg/dL (ref 0.0–1.2)
CO2: 20 mmol/L (ref 20–29)
Calcium: 9.5 mg/dL (ref 8.6–10.2)
Chloride: 103 mmol/L (ref 96–106)
Creatinine, Ser: 1.08 mg/dL (ref 0.76–1.27)
GFR calc Af Amer: 85 mL/min/{1.73_m2} (ref >59)
GFR calc non Af Amer: 73 mL/min/{1.73_m2} (ref >59)
Globulin, Total: 3 g/dL (ref 1.5–4.5)
Glucose: 93 mg/dL (ref 65–99)
Potassium: 4.5 mmol/L (ref 3.5–5.2)
Sodium: 141 mmol/L (ref 134–144)
Total Protein: 7.2 g/dL (ref 6.0–8.5)

## 2018-12-07 LAB — CBC WITH DIFFERENTIAL/PLATELET
Basophils Absolute: 0 10*3/uL (ref 0.0–0.2)
Basos: 1 %
EOS (ABSOLUTE): 0.5 10*3/uL — ABNORMAL HIGH (ref 0.0–0.4)
Eos: 5 %
Hematocrit: 42.2 % (ref 37.5–51.0)
Hemoglobin: 12.9 g/dL — ABNORMAL LOW (ref 13.0–17.7)
Immature Grans (Abs): 0 10*3/uL (ref 0.0–0.1)
Immature Granulocytes: 1 %
Lymphocytes Absolute: 2.3 10*3/uL (ref 0.7–3.1)
Lymphs: 26 %
MCH: 21.7 pg — ABNORMAL LOW (ref 26.6–33.0)
MCHC: 30.6 g/dL — ABNORMAL LOW (ref 31.5–35.7)
MCV: 71 fL — ABNORMAL LOW (ref 79–97)
Monocytes Absolute: 1.3 10*3/uL — ABNORMAL HIGH (ref 0.1–0.9)
Monocytes: 15 %
Neutrophils Absolute: 4.7 10*3/uL (ref 1.4–7.0)
Neutrophils: 52 %
Platelets: 309 10*3/uL (ref 150–450)
RBC: 5.94 x10E6/uL — ABNORMAL HIGH (ref 4.14–5.80)
RDW: 14.8 % (ref 11.6–15.4)
WBC: 8.8 10*3/uL (ref 3.4–10.8)

## 2018-12-07 LAB — PSA: Prostate Specific Ag, Serum: 1.2 ng/mL (ref 0.0–4.0)

## 2018-12-07 LAB — TESTOSTERONE: Testosterone: 189 ng/dL — ABNORMAL LOW (ref 264–916)

## 2018-12-07 LAB — URIC ACID: Uric Acid: 5.8 mg/dL (ref 3.7–8.6)

## 2018-12-07 NOTE — Progress Notes (Signed)
Internal Medicine Clinic Attending  I saw and evaluated the patient.  I personally confirmed the key portions of the history and exam documented by Dr. Aslam and I reviewed pertinent patient test results.  The assessment, diagnosis, and plan were formulated together and I agree with the documentation in the resident's note.     

## 2018-12-10 ENCOUNTER — Telehealth: Payer: Self-pay | Admitting: *Deleted

## 2018-12-10 NOTE — Telephone Encounter (Signed)
Patient on carvedilol 12.5mg  bid for HTN and colchicine 0.6mg  prn for gout flare. Patient is on allopurinol 100mg  qd for gout and reports that he only takes colchicine during a gout flare. As patient does not take the colchicine daily, should not be an issue. Would appreciate recs from Dr. Maudie Mercury on this or if need to switch to other agents.  Thank you!

## 2018-12-10 NOTE — Telephone Encounter (Signed)
Received fax from [name] regarding Rx for colchicine. States there is a known drug interaction with carvedilol. Please advise. Hubbard Hartshorn, RN, BSN

## 2018-12-11 ENCOUNTER — Telehealth: Payer: Self-pay | Admitting: Internal Medicine

## 2018-12-11 NOTE — Telephone Encounter (Signed)
Returned call to [place] at [facility]. Read phone note from PCP yesterday to [facility]. She will go ahead and fill colchicine unless she hears otherwise from Dr. Maudie Mercury. Hubbard Hartshorn, RN, BSN

## 2018-12-11 NOTE — Telephone Encounter (Signed)
pls contact pharmacy [phone]

## 2018-12-12 NOTE — Telephone Encounter (Addendum)
Hi, I agree with Dr. Margy Clarks assessment. Dr. Marva Panda, do you happen to know how patient is taking colchicine when he gets a flare? It is recommended by prescribing information that if taken with carvedilol to reduce colchicine to 0.6 mg x 1 dose, wait at least 3 days before taking another dose.  Thank you

## 2018-12-26 ENCOUNTER — Other Ambulatory Visit: Payer: Self-pay

## 2018-12-26 NOTE — Patient Outreach (Signed)
[place] [facility]) Care Management  12/26/2018  Daniel Perkins 05/25/1955 [id number]   Medication Adherence call to Daniel Perkins Hippa Identifiers Verify spoke with patient he is past due on Valsartan 160 mg patient explain he take 1 tablet daily on a regular basis patient has 20 more tablets and will order when finished. Daniel Perkins is showing past due under [facility].   [place] Management Direct Dial [phone]  Fax [phone] [email or web address]

## 2019-01-08 ENCOUNTER — Other Ambulatory Visit: Payer: Self-pay

## 2019-01-08 NOTE — Patient Outreach (Signed)
[facility]) Care Management  01/08/2019  Rayhaan Huster Oct 30, 1955 [id number]   Medication Adherence call to Mr. Caedan Sumler patient did was not home an older gentleman asnwer he explain patient leaves there but was not home.Mr. Ezequiel Kayser is showing past due on Valsartan 160 mg under [place].   Center Management Direct Dial [phone]  Fax [phone] [email or web address]

## 2019-01-15 ENCOUNTER — Telehealth: Payer: Self-pay | Admitting: Pharmacist

## 2019-01-22 ENCOUNTER — Ambulatory Visit: Payer: Medicare Other | Admitting: Cardiovascular Disease

## 2019-01-22 NOTE — Progress Notes (Deleted)
Cardiology Office Note   Date:  01/22/2019   ID:  De Blanch, DOB [birth year], MRN [id number]  PCP:  Harvie Heck, MD  Cardiologist:  Dr. Harrington Challenger.   No chief complaint on file.     History of Present Illness: Daniel Perkins is a 63 y.o. male who is here today for a follow up visit regarding peripheral arterial disease. He has history of minimal coronary plaque by previous cardiac catheterization, diabetes, hypertension and hyperlipidemia.  He quit smoking in 2010.   Patient has prolonged symptoms of exertional shortness of breath and atypical chest pain.  He was evaluated last year by a nuclear stress test which showed no evidence of ischemia with normal ejection fraction.  He continued to have symptoms and thus he underwent CTA of the coronary arteries in May 2019 which showed a calcium score of 5 with normal coronary arteries. He had a screening ABI that was abnormal.  He underwent lower extremity arterial Doppler which showed normal ABI on the left side and mildly to moderately reduced on the right side at 0.78.  Duplex showed occluded mid SFA in a short segment with one vessel runoff below the knee via the peroneal artery. He has known history of clubfoot of both lower extremities which limits his physical activities.   He was treated medically given his minimal symptoms.  He has been doing reasonably well but continues to complain of exertional dyspnea and occasionally with chest discomfort.  He denies leg claudication at the present time.  Past Medical History:  Diagnosis Date   Club foot of both lower extremities 1958   Coronary artery disease    Holter monitor 02/2012 - sinus with rare PVC   CTEV (congenital talipes equinovarus)    Gout    Hyperlipidemia    Hypertension    Myocardial infarction [facility]) ~ 2007   "mild"   Osteomyelitis ([place])    Pneumonia 2000's   "couple times"   Type II diabetes mellitus ([place]) 2011    Past Surgical History:    Procedure Laterality Date   ANTERIOR CERVICAL DECOMP/DISCECTOMY FUSION  X 2   CARDIAC CATHETERIZATION     FOOT FRACTURE SURGERY Right 1990's   "pt a steel plate in"   FOOT SURGERY Left x16   "joints collapsed; keep getting infected"   POSTERIOR CERVICAL FUSION/FORAMINOTOMY  X 2     Current Outpatient Medications  Medication Sig Dispense Refill   acetaminophen (TYLENOL) 500 MG tablet Take 1,000 mg by mouth every 6 (six) hours as needed for moderate pain (gout).     albuterol (VENTOLIN HFA) 108 (90 Base) MCG/ACT inhaler INHALE 2 PUFFS BY MOUTH EVERY 6 HOURS AS NEEDED FOR WHEEZING FOR SHORTNESS OF BREATH     allopurinol (ZYLOPRIM) 100 MG tablet Take 1 tablet (100 mg total) by mouth daily. 90 tablet 3   amLODipine (NORVASC) 5 MG tablet Take 1 tablet (5 mg total) by mouth daily. 90 tablet 3   aspirin EC 81 MG tablet Take 1 tablet (81 mg total) by mouth daily. 30 tablet 3   Blood Glucose Calibration (ONETOUCH VERIO) SOLN Use to check blood glucose as instructed. E11.9. 1 each 0   Blood Glucose Monitoring Suppl (ONE TOUCH ULTRA 2) W/DEVICE KIT Check blood sugar twice daily as instructed dx code 250.00 1 each 0   carvedilol (COREG) 12.5 MG tablet Take 1 tablet (12.5 mg total) by mouth 2 (two) times daily. 180 tablet 1   colchicine 0.6 MG tablet TAKE 1 TABLET  BY MOUTH  DAILY AS NEEDED FOR GOUT  FLARE 30 tablet 0   empagliflozin (JARDIANCE) 10 MG TABS tablet Take 10 mg by mouth daily. 90 tablet 1   glipiZIDE (GLUCOTROL) 10 MG tablet TAKE 1 TABLET BY MOUTH  TWICE A DAY BEFORE A MEAL 180 tablet 0   glucose blood (ONETOUCH VERIO) test strip UsE 1 TIME DAILY TO TEST BLOOD SUGAR. E11.9. Non-iNSULIN DEPENDENT 100 each 5   Insulin Pen Needle 31G X 5 MM MISC Use to inject liraglutide once daily as instructed Dx E11.9 100 each 5   Insulin Pen Needle 32G X 4 MM MISC Use to inject victoza one time a day 100 each 3   liraglutide (VICTOZA) 18 MG/3ML SOPN Inject 1.8 mg into the skin daily  as instructed 3 pen 11   nitroGLYCERIN (NITROSTAT) 0.4 MG SL tablet DISSOLVE 1 TABLET UNDER THE TONGUE EVERY 5 MINUTES AS  NEEDED FOR CHEST PAIN , MAX 3 TABLETS IN 15 MINUTES,  CALL 911 IF NO RELIEF. 25 tablet 1   rosuvastatin (CRESTOR) 20 MG tablet Take 1 tablet (20 mg total) by mouth daily. 90 tablet 3   tamsulosin (FLOMAX) 0.4 MG CAPS capsule Take 1 capsule (0.4 mg total) by mouth daily. 60 capsule 0   valsartan (DIOVAN) 160 MG tablet Take 1 tablet (160 mg total) by mouth daily. 30 tablet 11   No current facility-administered medications for this visit.     Allergies:   Morphine and Pravastatin sodium    Social History:  The patient  reports that he quit smoking about 11 years ago. His smoking use included cigarettes. He has a 32.00 pack-year smoking history. He has never used smokeless tobacco. He reports current alcohol use. He reports that he does not use drugs.   Family History:  The patient's family history includes Coronary artery disease in his mother; Diabetes in his mother; Heart attack in his brother; Heart disease in his mother.    ROS:  Please see the history of present illness.   Otherwise, review of systems are positive for none.   All other systems are reviewed and negative.    PHYSICAL EXAM: VS:  There were no vitals taken for this visit. , BMI There is no height or weight on file to calculate BMI. GEN: Well nourished, well developed, in no acute distress  HEENT: normal  Neck: no JVD, carotid bruits, or masses Cardiac: RRR; no rubs, or gallops,no edema .  2 out of 6 systolic ejection murmur in the aortic area. Respiratory:  clear to auscultation bilaterally, normal work of breathing GI: soft, nontender, nondistended, + BS MS: no deformity or atrophy  Skin: warm and dry, no rash Neuro:  Strength and sensation are intact Psych: euthymic mood, full affect Vascular: Femoral pulses normal bilaterally.  Distal pulses are not palpable on the right and faint on the left  side.   EKG:  EKG is ordered today. EKG showed normal sinus rhythm with left anterior fascicular block and nonspecific ST changes.   Recent Labs: 07/31/2018: B Natriuretic Peptide 17.3 08/20/2018: TSH 0.96 12/06/2018: ALT 16; BUN 14; Creatinine, Ser 1.08; Hemoglobin 12.9; Platelets 309; Potassium 4.5; Sodium 141    Lipid Panel    Component Value Date/Time   CHOL 119 12/06/2018 1648   TRIG 278 (H) 12/06/2018 1648   HDL 30 (L) 12/06/2018 1648   CHOLHDL 4.0 12/06/2018 1648   CHOLHDL 6.1 06/30/2014 1634   VLDL 45 (H) 06/30/2014 1634   LDLCALC 33 12/06/2018 1648  Wt Readings from Last 3 Encounters:  12/06/18 235 lb 12.8 oz (107 kg)  08/20/18 233 lb (105.7 kg)  07/31/18 239 lb 6.4 oz (108.6 kg)      No flowsheet data found.    ASSESSMENT AND PLAN:  1.  Peripheral arterial disease: The patient reports no claudication at the present time.  I recommend continuing medical therapy and treatment of risk factors.    2.  Essential hypertension: Blood pressure is controlled on current medications.  3.  Hyperlipidemia: The patient is taking fluvastatin but he was supposed to be on rosuvastatin.  Given his diabetes and peripheral arterial disease, he should be on a potent statin and thus I switch him back to rosuvastatin 20 mg once daily.  4.  Exertional dyspnea and atypical chest pain: The patient reports continued symptoms.  Ischemic cardiac evaluation last year was unremarkable including CTA.  I am going to obtain an echocardiogram to evaluate his diastolic function and pulmonary pressure. I do suspect that there is an element of physical deconditioning and I discussed with him the importance of starting an exercise program and attempting weight loss.   Disposition:   FU with me in 6 months  Signed,  Kathlyn Sacramento, MD  01/22/2019 8:26 AM    [place]

## 2019-02-07 ENCOUNTER — Other Ambulatory Visit: Payer: Self-pay

## 2019-02-07 NOTE — Patient Outreach (Signed)
[place] [facility]) Care Management  02/07/2019  Nehemias Sauceda [birth year] [id number]   Medication Adherence call to Mr. Waldon Sheerin Hippa Identifiers Verify spoke with patient he is past due on Rosuvastatin 20 mg,Jardiance 10 mg,Victoza,Valsartan 160 mg patient explain he is taking all his medication he said the one he needs the most is Valsartan 160 mg he needs all the other medications too, patient ask to call [facility] an [facility] all [facility] for pt to pick up. Mr. Krontz is showing past due under [facility].   [place] Management Direct Dial [phone]  Fax [phone] [email or web address]

## 2019-02-25 ENCOUNTER — Telehealth: Payer: Self-pay | Admitting: *Deleted

## 2019-02-25 NOTE — Telephone Encounter (Signed)
A message ws left, re: his follow up visit. 

## 2019-03-05 DIAGNOSIS — R350 Frequency of micturition: Secondary | ICD-10-CM | POA: Diagnosis not present

## 2019-03-05 DIAGNOSIS — R3915 Urgency of urination: Secondary | ICD-10-CM | POA: Diagnosis not present

## 2019-03-07 ENCOUNTER — Ambulatory Visit (HOSPITAL_COMMUNITY)
Admission: EM | Admit: 2019-03-07 | Discharge: 2019-03-07 | Disposition: A | Discharge status: Home / Self Care | Payer: Medicare Other | Attending: Urgent Care | Admitting: Urgent Care

## 2019-03-07 ENCOUNTER — Ambulatory Visit (INDEPENDENT_AMBULATORY_CARE_PROVIDER_SITE_OTHER): Payer: Medicare Other

## 2019-03-07 ENCOUNTER — Encounter (HOSPITAL_COMMUNITY): Payer: Self-pay

## 2019-03-07 ENCOUNTER — Other Ambulatory Visit: Payer: Self-pay

## 2019-03-07 DIAGNOSIS — R0781 Pleurodynia: Secondary | ICD-10-CM

## 2019-03-07 DIAGNOSIS — M778 Other enthesopathies, not elsewhere classified: Secondary | ICD-10-CM

## 2019-03-07 DIAGNOSIS — S299XXA Unspecified injury of thorax, initial encounter: Secondary | ICD-10-CM | POA: Diagnosis not present

## 2019-03-07 DIAGNOSIS — I10 Essential (primary) hypertension: Secondary | ICD-10-CM

## 2019-03-07 DIAGNOSIS — S4992XA Unspecified injury of left shoulder and upper arm, initial encounter: Secondary | ICD-10-CM

## 2019-03-07 DIAGNOSIS — M25512 Pain in left shoulder: Secondary | ICD-10-CM | POA: Diagnosis not present

## 2019-03-07 MED ORDER — CYCLOBENZAPRINE HCL 5 MG PO TABS: 5.0000 mg | ORAL_TABLET | Freq: Three times a day (TID) | ORAL | 0 refills | Status: DC | PRN

## 2019-03-07 MED ORDER — DICLOFENAC SODIUM 50 MG PO TBEC: 50.0000 mg | DELAYED_RELEASE_TABLET | Freq: Two times a day (BID) | ORAL | 0 refills | Status: DC

## 2019-03-07 NOTE — ED Provider Notes (Signed)
MRN: [id number] DOB: 1955/08/16  Subjective:   Daniel Perkins is a 63 y.o. male presenting for 1 month history of persistent worsening left-sided upper rib pain, left shoulder pain with decreased range of motion.  Symptoms started after he slipped and fell with his arms outstretched.  He has continuously used Tylenol but is having worsening pain.  Regarding his blood pressure, patient reports compliance with his blood pressure medications generally but forgot his dose today.  He also has diabetes, last A1c was 7.4% on 12/06/2018. Of note, patient does have a hx of left shoulder tendonitis. Has a hx of gout.  No current facility-administered medications for this encounter.   Current Outpatient Medications:  .  acetaminophen (TYLENOL) 500 MG tablet, Take 1,000 mg by mouth every 6 (six) hours as needed for moderate pain (gout)., Disp: , Rfl:  .  albuterol (VENTOLIN HFA) 108 (90 Base) MCG/ACT inhaler, INHALE 2 PUFFS BY MOUTH EVERY 6 HOURS AS NEEDED FOR WHEEZING FOR SHORTNESS OF BREATH, Disp: , Rfl:  .  allopurinol (ZYLOPRIM) 100 MG tablet, Take 1 tablet (100 mg total) by mouth daily., Disp: 90 tablet, Rfl: 3 .  amLODipine (NORVASC) 5 MG tablet, Take 1 tablet (5 mg total) by mouth daily., Disp: 90 tablet, Rfl: 3 .  aspirin EC 81 MG tablet, Take 1 tablet (81 mg total) by mouth daily., Disp: 30 tablet, Rfl: 3 .  Blood Glucose Calibration (ONETOUCH VERIO) SOLN, Use to check blood glucose as instructed. E11.9., Disp: 1 each, Rfl: 0 .  Blood Glucose Monitoring Suppl (ONE TOUCH ULTRA 2) W/DEVICE KIT, Check blood sugar twice daily as instructed dx code 250.00, Disp: 1 each, Rfl: 0 .  carvedilol (COREG) 12.5 MG tablet, Take 1 tablet (12.5 mg total) by mouth 2 (two) times daily., Disp: 180 tablet, Rfl: 1 .  colchicine 0.6 MG tablet, TAKE 1 TABLET BY MOUTH  DAILY AS NEEDED FOR GOUT  FLARE, Disp: 30 tablet, Rfl: 0 .  empagliflozin (JARDIANCE) 10 MG TABS tablet, Take 10 mg by mouth daily., Disp: 90 tablet, Rfl:  1 .  glipiZIDE (GLUCOTROL) 10 MG tablet, TAKE 1 TABLET BY MOUTH  TWICE A DAY BEFORE A MEAL, Disp: 180 tablet, Rfl: 0 .  glucose blood (ONETOUCH VERIO) test strip, UsE 1 TIME DAILY TO TEST BLOOD SUGAR. 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  Insulin Pen Needle 31G X 5 MM MISC, Use to inject liraglutide once daily as instructed Dx E11.9, Disp: 100 each, Rfl: 5 .  Insulin Pen Needle 32G X 4 MM MISC, Use to inject victoza one time a day, Disp: 100 each, Rfl: 3 .  liraglutide (VICTOZA) 18 MG/3ML SOPN, Inject 1.8 mg into the skin daily as instructed, Disp: 3 pen, Rfl: 11 .  nitroGLYCERIN (NITROSTAT) 0.4 MG SL tablet, DISSOLVE 1 TABLET UNDER THE TONGUE EVERY 5 MINUTES AS  NEEDED FOR CHEST PAIN , MAX 3 TABLETS IN 15 MINUTES,  CALL 911 IF NO RELIEF., Disp: 25 tablet, Rfl: 1 .  rosuvastatin (CRESTOR) 20 MG tablet, Take 1 tablet (20 mg total) by mouth daily., Disp: 90 tablet, Rfl: 3 .  tamsulosin (FLOMAX) 0.4 MG CAPS capsule, Take 1 capsule (0.4 mg total) by mouth daily., Disp: 60 capsule, Rfl: 0 .  valsartan (DIOVAN) 160 MG tablet, Take 1 tablet (160 mg total) by mouth daily., Disp: 30 tablet, Rfl: 11    Allergies  Allergen Reactions  . Morphine Itching  . Pravastatin Sodium Other (See Comments)    Severe muscle cramps with  one time requiring admission.     Past Medical History:  Diagnosis Date  . Club foot of both lower extremities 1958  . Coronary artery disease    Holter monitor 02/2012 - sinus with rare PVC  . CTEV (congenital talipes equinovarus)   . Gout   . Hyperlipidemia   . Hypertension   . Myocardial infarction [facility]) ~ 2007   "mild"  . Osteomyelitis ([place])   . Pneumonia 2000's   "couple times"  . Type II diabetes mellitus ([place]) 2011     Past Surgical History:  Procedure Laterality Date  . ANTERIOR CERVICAL DECOMP/DISCECTOMY FUSION  X 2  . CARDIAC CATHETERIZATION    . FOOT FRACTURE SURGERY Right 1990's   "pt a steel plate in"  . FOOT SURGERY Left x16   "joints  collapsed; keep getting infected"  . POSTERIOR CERVICAL FUSION/FORAMINOTOMY  X 2    Social History   Tobacco Use  . Smoking status: Former Smoker    Packs/day: 1.00    Years: 32.00    Pack years: 32.00    Types: Cigarettes    Quit date: 07/06/2007    Years since quitting: 11.6  . Smokeless tobacco: Never Used  Substance Use Topics  . Alcohol use: Yes    Alcohol/week: 0.0 standard drinks    Comment: Sometimes.  . Drug use: No   Family History  Problem Relation Age of Onset  . Diabetes Mother   . Coronary artery disease Mother        HAS PACEMAKER  . Heart disease Mother   . Heart attack Brother   . Lung disease Neg Hx     Social History   Tobacco Use  . Smoking status: Former Smoker    Packs/day: 1.00    Years: 32.00    Pack years: 32.00    Types: Cigarettes    Quit date: 07/06/2007    Years since quitting: 11.6  . Smokeless tobacco: Never Used  Substance Use Topics  . Alcohol use: Yes    Alcohol/week: 0.0 standard drinks    Comment: Sometimes.  . Drug use: No    Family History  Problem Relation Age of Onset  . Diabetes Mother   . Coronary artery disease Mother        HAS PACEMAKER  . Heart disease Mother   . Heart attack Brother   . Lung disease Neg Hx     ROS  Objective:   Vitals: BP (!) 163/109 (BP Location: Left Arm)   Pulse 86   Temp (!) 97 F (36.1 C) (Oral)   Resp 17   SpO2 96%   BP Readings from Last 3 Encounters:  03/07/19 (!) 163/109  12/06/18 126/81  10/21/18 (!) 135/98    Physical Exam Constitutional:      General: He is not in acute distress.    Appearance: Normal appearance. He is well-developed. He is obese. He is not ill-appearing, toxic-appearing or diaphoretic.  HENT:     Head: Normocephalic and atraumatic.     Right Ear: External ear normal.     Left Ear: External ear normal.     Nose: Nose normal.     Mouth/Throat:     Mouth: Mucous membranes are moist.     Pharynx: Oropharynx is clear.  Eyes:     General: No  scleral icterus.    Extraocular Movements: Extraocular movements intact.     Pupils: Pupils are equal, round, and reactive to light.  Cardiovascular:  Rate and Rhythm: Normal rate and regular rhythm.     Heart sounds: Murmur (mild systolic ejection best heard at LUSB) present. No friction rub. No gallop.   Pulmonary:     Effort: Pulmonary effort is normal. No respiratory distress.     Breath sounds: Normal breath sounds. No stridor. No wheezing, rhonchi or rales.  Chest:     Chest wall: Tenderness (Lateral superior chest wall of left side close to axillary area) present.  Musculoskeletal:     Left shoulder: He exhibits decreased range of motion (In all directions worst with abduction and external rotation), tenderness ([facility] joint, anterior deltoid), spasm (right trapezius) and decreased strength (secondary to pain). He exhibits no swelling, no effusion, no crepitus and no deformity.  Skin:    General: Skin is warm and dry.  Neurological:     Mental Status: He is alert and oriented to person, place, and time.  Psychiatric:        Mood and Affect: Mood normal.        Behavior: Behavior normal.        Thought Content: Thought content normal.     Dg Ribs Unilateral W/chest Left  Result Date: 03/07/2019 CLINICAL DATA:  Fall, left rib and shoulder injury, pain EXAM: LEFT RIBS AND CHEST - 3+ VIEW COMPARISON:  08/20/2018 FINDINGS: No fracture or other bone lesions are seen involving the ribs. There is no evidence of pneumothorax or pleural effusion. Both lungs are clear. Heart size and mediastinal contours are within normal limits. IMPRESSION: Negative. Electronically Signed   By: Rolm Baptise M.D.   On: 03/07/2019 11:16   Dg Shoulder Left  Result Date: 03/07/2019 CLINICAL DATA:  [place].  Left shoulder and rib pain. EXAM: LEFT SHOULDER - 2+ VIEW COMPARISON:  None. FINDINGS: The joint spaces are maintained. No acute bony findings or bone lesion. No abnormal soft tissue calcifications.  The visualized lung is clear and the visualized ribs are intact. IMPRESSION: No fracture or dislocation. Electronically Signed   By: Marijo Sanes M.D.   On: 03/07/2019 11:18    Assessment and Plan :   1. Acute pain of left shoulder   2. Rib pain on left side   3. Essential hypertension   4. Elevated blood pressure reading with diagnosis of hypertension   5. Morbid obesity ([place])   6. Injury of left shoulder, initial encounter   7. Shoulder tendonitis, left     We will use diclofenac at a lower dose, muscle relaxant as well for shoulder and rib pain related to his fall.  Given his history of left shoulder tendinitis, recommended a set up a consult with an orthopedist.  Patient was agreeable to this.  Emphasized need for compliance with his blood pressure medications.  He is to keep close follow-up with his PCP. Counseled patient on potential for adverse effects with medications prescribed/recommended today, ER and return-to-clinic precautions discussed, patient verbalized understanding.    Jaynee Eagles, PA-C 03/07/19 1422

## 2019-03-07 NOTE — ED Triage Notes (Signed)
Pt presents with left shoulder injury and left rib injury after a fall about a month ago; pt states he is having a lot of pain in those areas with not much ROM

## 2019-03-20 DIAGNOSIS — E349 Endocrine disorder, unspecified: Secondary | ICD-10-CM | POA: Diagnosis not present

## 2019-03-25 ENCOUNTER — Other Ambulatory Visit: Payer: Self-pay

## 2019-03-25 DIAGNOSIS — Z20822 Contact with and (suspected) exposure to covid-19: Secondary | ICD-10-CM

## 2019-03-26 ENCOUNTER — Other Ambulatory Visit: Payer: Self-pay

## 2019-03-26 ENCOUNTER — Ambulatory Visit (INDEPENDENT_AMBULATORY_CARE_PROVIDER_SITE_OTHER): Payer: Medicare Other | Admitting: Cardiovascular Disease

## 2019-03-26 VITALS — BP 145/94 | HR 86 | Temp 97.3°F | Ht 68.0 in | Wt 235.6 lb

## 2019-03-26 DIAGNOSIS — I10 Essential (primary) hypertension: Secondary | ICD-10-CM | POA: Diagnosis not present

## 2019-03-26 DIAGNOSIS — E785 Hyperlipidemia, unspecified: Secondary | ICD-10-CM

## 2019-03-26 DIAGNOSIS — I739 Peripheral vascular disease, unspecified: Secondary | ICD-10-CM

## 2019-03-26 LAB — NOVEL CORONAVIRUS, NAA: SARS-CoV-2, NAA: NOT DETECTED

## 2019-03-26 NOTE — Patient Instructions (Signed)
Medication Instructions:  Your physician recommends that you continue on your current medications as directed. Please refer to the Current Medication list given to you today.  If you need a refill on your cardiac medications before your next appointment, please call your pharmacy.   Lab work: NONE  Testing/Procedures: NONE  Follow-Up: At [facility], you and your health needs are our priority.  As part of our continuing mission to provide you with exceptional heart care, we have created designated Provider Care Teams.  These Care Teams include your primary Cardiologist (physician) and Advanced Practice Providers (APPs -  Physician Assistants and Nurse Practitioners) who all work together to provide you with the care you need, when you need it. You may see Dr Arida or one of the following Advanced Practice Providers on your designated Care Team:    Luke Kilroy, PA-C  Callie Goodrich, PA-C  Jesse Cleaver, FNP  Your physician wants you to follow-up in: 1 year. You will receive a reminder letter in the mail two months in advance. If you don't receive a letter, please call our office to schedule the follow-up appointment.      

## 2019-03-26 NOTE — Progress Notes (Signed)
Cardiology Office Note   Date:  03/26/2019   ID:  Jewelz Kobus, DOB [birth year], MRN [id number]  PCP:  Harvie Heck, MD  Cardiologist:  Dr. Harrington Challenger.   No chief complaint on file.     History of Present Illness: Edis Huish is a 63 y.o. male who is here today for a follow up visit regarding peripheral arterial disease. He has history of minimal coronary plaque by previous cardiac catheterization, diabetes, hypertension and hyperlipidemia.  He quit smoking in 2010.   Patient has prolonged symptoms of exertional shortness of breath and atypical chest pain.  He underwent extensive cardiac evaluation in the past including normal Lexiscan Myoview in December 2018.   CTA of the coronary arteries in May 2019  showed a calcium score of 5 with normal coronary arteries.  Echocardiogram in February 2020 showed normal LV systolic function with no significant valvular abnormalities.  He is followed for peripheral arterial disease with normal ABI in the left and mildly reduced on the right at 0.78 due to occluded mid SFA in a short segment with one vessel runoff below the knee via the peroneal artery. He has known history of clubfoot of both lower extremities which limits his physical activities.   He was treated medically given his minimal symptoms.  He has been doing reasonably well with no recent chest pain or worsening dyspnea.  He reports occasional bilateral calf pain with walking.  No rest pain or lower extremity ulceration.  Past Medical History:  Diagnosis Date  . Club foot of both lower extremities 1958  . Coronary artery disease    Holter monitor 02/2012 - sinus with rare PVC  . CTEV (congenital talipes equinovarus)   . Gout   . Hyperlipidemia   . Hypertension   . Myocardial infarction [facility]) ~ 2007   "mild"  . Osteomyelitis ([place])   . Pneumonia 2000's   "couple times"  . Type II diabetes mellitus ([place]) 2011    Past Surgical History:  Procedure Laterality Date  .  ANTERIOR CERVICAL DECOMP/DISCECTOMY FUSION  X 2  . CARDIAC CATHETERIZATION    . FOOT FRACTURE SURGERY Right 1990's   "pt a steel plate in"  . FOOT SURGERY Left x16   "joints collapsed; keep getting infected"  . POSTERIOR CERVICAL FUSION/FORAMINOTOMY  X 2     Current Outpatient Medications  Medication Sig Dispense Refill  . albuterol (VENTOLIN HFA) 108 (90 Base) MCG/ACT inhaler INHALE 2 PUFFS BY MOUTH EVERY 6 HOURS AS NEEDED FOR WHEEZING FOR SHORTNESS OF BREATH    . allopurinol (ZYLOPRIM) 100 MG tablet Take 1 tablet (100 mg total) by mouth daily. 90 tablet 3  . amLODipine (NORVASC) 5 MG tablet Take 1 tablet (5 mg total) by mouth daily. 90 tablet 3  . aspirin EC 81 MG tablet Take 1 tablet (81 mg total) by mouth daily. 30 tablet 3  . carvedilol (COREG) 12.5 MG tablet Take 1 tablet (12.5 mg total) by mouth 2 (two) times daily. 180 tablet 1  . colchicine 0.6 MG tablet TAKE 1 TABLET BY MOUTH  DAILY AS NEEDED FOR GOUT  FLARE 30 tablet 0  . empagliflozin (JARDIANCE) 10 MG TABS tablet Take 10 mg by mouth daily. 90 tablet 1  . glipiZIDE (GLUCOTROL) 10 MG tablet TAKE 1 TABLET BY MOUTH  TWICE A DAY BEFORE A MEAL 180 tablet 0  . Insulin Pen Needle 32G X 4 MM MISC Use to inject victoza one time a day 100 each 3  . liraglutide (  VICTOZA) 18 MG/3ML SOPN Inject 1.8 mg into the skin daily as instructed 3 pen 11  . nitroGLYCERIN (NITROSTAT) 0.4 MG SL tablet DISSOLVE 1 TABLET UNDER THE TONGUE EVERY 5 MINUTES AS  NEEDED FOR CHEST PAIN , MAX 3 TABLETS IN 15 MINUTES,  CALL 911 IF NO RELIEF. 25 tablet 1  . rosuvastatin (CRESTOR) 20 MG tablet Take 1 tablet (20 mg total) by mouth daily. 90 tablet 3  . tamsulosin (FLOMAX) 0.4 MG CAPS capsule Take 1 capsule (0.4 mg total) by mouth daily. 60 capsule 0  . valsartan (DIOVAN) 160 MG tablet Take 1 tablet (160 mg total) by mouth daily. 30 tablet 11   No current facility-administered medications for this visit.     Allergies:   Morphine and Pravastatin sodium    Social  History:  The patient  reports that he quit smoking about 11 years ago. His smoking use included cigarettes. He has a 32.00 pack-year smoking history. He has never used smokeless tobacco. He reports current alcohol use. He reports that he does not use drugs.   Family History:  The patient's family history includes Coronary artery disease in his mother; Diabetes in his mother; Heart attack in his brother; Heart disease in his mother.    ROS:  Please see the history of present illness.   Otherwise, review of systems are positive for none.   All other systems are reviewed and negative.    PHYSICAL EXAM: VS:  BP (!) 145/94   Pulse 86   Temp (!) 97.3 F (36.3 C)   Ht 5\' 8"  (1.727 m)   Wt 235 lb 9.6 oz (106.9 kg)   SpO2 96%   BMI 35.82 kg/m  , BMI Body mass index is 35.82 kg/m. GEN: Well nourished, well developed, in no acute distress  HEENT: normal  Neck: no JVD, carotid bruits, or masses Cardiac: RRR; no rubs, or gallops,no edema .  2 out of 6 systolic ejection murmur in the aortic area. Respiratory:  clear to auscultation bilaterally, normal work of breathing GI: soft, nontender, nondistended, + BS MS: no deformity or atrophy  Skin: warm and dry, no rash Neuro:  Strength and sensation are intact Psych: euthymic mood, full affect Vascular: Femoral pulses normal bilaterally.  Distal pulses are not palpable on the right and faint on the left side.   EKG:  EKG is ordered today. EKG showed normal sinus rhythm with left anterior fascicular block and nonspecific ST changes.   Recent Labs: 07/31/2018: B Natriuretic Peptide 17.3 08/20/2018: TSH 0.96 12/06/2018: ALT 16; BUN 14; Creatinine, Ser 1.08; Hemoglobin 12.9; Platelets 309; Potassium 4.5; Sodium 141    Lipid Panel    Component Value Date/Time   CHOL 119 12/06/2018 1648   TRIG 278 (H) 12/06/2018 1648   HDL 30 (L) 12/06/2018 1648   CHOLHDL 4.0 12/06/2018 1648   CHOLHDL 6.1 06/30/2014 1634   VLDL 45 (H) 06/30/2014 1634    LDLCALC 33 12/06/2018 1648      Wt Readings from Last 3 Encounters:  03/26/19 235 lb 9.6 oz (106.9 kg)  12/06/18 235 lb 12.8 oz (107 kg)  08/20/18 233 lb (105.7 kg)      No flowsheet data found.    ASSESSMENT AND PLAN:  1.  Peripheral arterial disease: The patient reports minimal claudication at the present time.  I recommend continuing medical therapy and treatment of risk factors.    2.  Essential hypertension: Blood pressure is mildly elevated but he did not take his blood  pressure medications yet.  3.  Hyperlipidemia: Continue treatment with rosuvastatin 20 mg daily.  Most recent lipid profile in July showed an LDL of 33.  His triglyceride was mildly elevated at 278 likely due to suboptimal control of diabetes.  Hemoglobin A1c was 7.4.    Disposition:   FU with me in 12 months  Signed,  Lorine Bears, MD  03/26/2019 8:40 AM    [facility]

## 2019-03-27 NOTE — Addendum Note (Signed)
Addended by: Vennie Homans on: 03/27/2019 03:49 PM   Modules accepted: Orders

## 2019-03-28 ENCOUNTER — Other Ambulatory Visit: Payer: Self-pay | Admitting: *Deleted

## 2019-03-28 DIAGNOSIS — M10042 Idiopathic gout, left hand: Secondary | ICD-10-CM

## 2019-03-28 DIAGNOSIS — I10 Essential (primary) hypertension: Secondary | ICD-10-CM

## 2019-03-28 MED ORDER — AMLODIPINE BESYLATE 5 MG PO TABS: 5.0000 mg | ORAL_TABLET | Freq: Every day | ORAL | 3 refills | Status: DC

## 2019-03-28 MED ORDER — ALLOPURINOL 100 MG PO TABS: 100.0000 mg | ORAL_TABLET | Freq: Every day | ORAL | 3 refills | Status: DC

## 2019-04-02 ENCOUNTER — Telehealth: Payer: Self-pay

## 2019-04-02 NOTE — Telephone Encounter (Signed)
Patient given negative result and verbalized understanding  

## 2019-04-24 ENCOUNTER — Ambulatory Visit (HOSPITAL_COMMUNITY)
Admission: EM | Admit: 2019-04-24 | Discharge: 2019-04-24 | Disposition: A | Discharge status: Home / Self Care | Payer: Medicare Other | Attending: Family Medicine | Admitting: Family Medicine

## 2019-04-24 ENCOUNTER — Encounter (HOSPITAL_COMMUNITY): Payer: Self-pay

## 2019-04-24 DIAGNOSIS — Z20828 Contact with and (suspected) exposure to other viral communicable diseases: Secondary | ICD-10-CM | POA: Diagnosis present

## 2019-04-24 DIAGNOSIS — I959 Hypotension, unspecified: Secondary | ICD-10-CM | POA: Diagnosis not present

## 2019-04-24 DIAGNOSIS — R5383 Other fatigue: Secondary | ICD-10-CM | POA: Insufficient documentation

## 2019-04-24 DIAGNOSIS — R481 Agnosia: Secondary | ICD-10-CM

## 2019-04-24 DIAGNOSIS — R430 Anosmia: Secondary | ICD-10-CM | POA: Diagnosis not present

## 2019-04-24 DIAGNOSIS — Z20822 Contact with and (suspected) exposure to covid-19: Secondary | ICD-10-CM

## 2019-04-24 DIAGNOSIS — R05 Cough: Secondary | ICD-10-CM | POA: Diagnosis not present

## 2019-04-24 DIAGNOSIS — R432 Parageusia: Secondary | ICD-10-CM

## 2019-04-24 DIAGNOSIS — U071 COVID-19: Secondary | ICD-10-CM | POA: Diagnosis not present

## 2019-04-24 LAB — POC SARS CORONAVIRUS 2 AG -  ED: SARS Coronavirus 2 Ag: NEGATIVE

## 2019-04-24 LAB — POC SARS CORONAVIRUS 2 AG: SARS Coronavirus 2 Ag: NEGATIVE

## 2019-04-24 MED ORDER — BENZONATATE 200 MG PO CAPS: 200.0000 mg | ORAL_CAPSULE | Freq: Three times a day (TID) | ORAL | 0 refills | Status: DC | PRN

## 2019-04-24 MED ORDER — FLUTICASONE PROPIONATE 50 MCG/ACT NA SUSP: 1.0000 | Freq: Every day | NASAL | 0 refills | Status: DC

## 2019-04-24 MED ORDER — ACETAMINOPHEN 325 MG PO TABS
650.0000 mg | ORAL_TABLET | Freq: Once | ORAL | Status: AC
  Administered 2019-04-24: 650 mg via ORAL

## 2019-04-24 MED ORDER — ACETAMINOPHEN 325 MG PO TABS
ORAL_TABLET | ORAL | Status: AC
  Filled 2019-04-24: qty 2

## 2019-04-24 MED ORDER — IBUPROFEN 600 MG PO TABS: 600.0000 mg | ORAL_TABLET | Freq: Four times a day (QID) | ORAL | 0 refills | Status: DC | PRN

## 2019-04-24 NOTE — ED Triage Notes (Signed)
Pt presents to the UC with fatigue, low appetite, loss of smell and taste x 4 days.

## 2019-04-24 NOTE — Discharge Instructions (Signed)
Rapid Covid negative, second Covid swab pending.  Please quarantine until results return. Please rest and drink plenty of fluids Alternate Tylenol and ibuprofen every 4 hours for better management of fever, body aches and headaches May use Tessalon as needed every 8 hours for cough Flonase nasal spray 1 to 2 spray in each nostril daily for congestion  Please follow-up in the emergency room if developing increased fatigue, weakness, dizziness, lightheadedness, shortness of breath or difficulty breathing

## 2019-04-25 NOTE — ED Provider Notes (Signed)
[facility]    CSN: [id number] Arrival date & time: 04/24/19  1933      History   Chief Complaint Chief Complaint  Patient presents with  . Fatigue    HPI Daniel Perkins is a 63 y.o. male history of DM type II, CAD, hypertension, lipidemia, presenting today for evaluation of fatigue, decreased appetite and loss of smell and taste.  Patient states that over the past 5 days he has felt very drained and fatigued.  He has had decreased energy and has been lying in the bed more than normal.  He is also had decreased appetite.  States that he tries eat food, but it is not appealing to him.  Denies abdominal pain nausea or vomiting.  He has also had loss of smell and taste.  States initially had some URI symptoms of congestion and cough, but feels like these are improving.  He denies any chest pain.  Does endorse shortness of breath, but states that this is normal for him, has felt short of breath for years.  Denies recent worsening.  Denies any known exposure to Covid.  States that his father is also sick, but he has not been tested for Covid.  He denies feeling dizzy or lightheaded.  States that he has been trying to stay hydrated and drinking a lot of diet green tea.  He is unsure of if he had has had any fevers, has not checked at home.  HPI  Past Medical History:  Diagnosis Date  . Club foot of both lower extremities 1958  . Coronary artery disease    Holter monitor 02/2012 - sinus with rare PVC  . CTEV (congenital talipes equinovarus)   . Gout   . Hyperlipidemia   . Hypertension   . Myocardial infarction [facility]) ~ 2007   "mild"  . Osteomyelitis ([facility])   . Pneumonia 2000's   "couple times"  . Type II diabetes mellitus ([facility]) 2011    Patient Active Problem List   Diagnosis Date Noted  . Prostate hypertrophy 12/06/2018  . Ingrown toenail 06/06/2018  . Rotator cuff tendonitis, left 02/14/2017  . Toe cyanosis 02/14/2017  . Skin lesion 03/23/2016  . Pulsatile mass of  left upper extremity 06/19/2015  . Sleep apnea-like behavior 10/01/2014  . Chest pain 09/24/2014  . Peripheral vascular disease ([facility]) 02/10/2014  . Microcytic anemia 02/06/2013  . CTEV (congenital talipes equinovarus)   . Statin intolerance 06/29/2012  . Esophageal reflux 06/29/2012  . Health care maintenance 06/28/2012  . Gout 06/12/2012  . Obesity, Class II, BMI 35-39.9 02/20/2012  . DOE (dyspnea on exertion) 02/20/2012  . SOB (shortness of breath) 08/18/2010  . Diabetes ([facility]) 09/06/2009  . ERECTILE DYSFUNCTION, NON-ORGANIC 02/16/2009  . Hyperlipemia 01/14/2009  . HYPERTENSION 01/14/2009    Past Surgical History:  Procedure Laterality Date  . ANTERIOR CERVICAL DECOMP/DISCECTOMY FUSION  X 2  . CARDIAC CATHETERIZATION    . FOOT FRACTURE SURGERY Right 1990's   "pt a steel plate in"  . FOOT SURGERY Left x16   "joints collapsed; keep getting infected"  . POSTERIOR CERVICAL FUSION/FORAMINOTOMY  X 2       Home Medications    Prior to Admission medications   Medication Sig Start Date End Date Taking? Authorizing Provider  albuterol (VENTOLIN HFA) 108 (90 Base) MCG/ACT inhaler INHALE 2 PUFFS BY MOUTH EVERY 6 HOURS AS NEEDED FOR WHEEZING FOR SHORTNESS OF BREATH 08/21/18   [provider]  allopurinol (ZYLOPRIM) 100 MG tablet Take 1 tablet (  100 mg total) by mouth daily. 03/28/19   Harvie Heck, MD  amLODipine (NORVASC) 5 MG tablet Take 1 tablet (5 mg total) by mouth daily. 03/28/19 03/27/20  Harvie Heck, MD  aspirin EC 81 MG tablet Take 1 tablet (81 mg total) by mouth daily. 03/19/15   Burgess Estelle, MD  benzonatate (TESSALON) 200 MG capsule Take 1 capsule (200 mg total) by mouth 3 (three) times daily as needed for up to 7 days for cough. 04/24/19 05/01/19  Frederica Chrestman C, PA-C  carvedilol (COREG) 12.5 MG tablet Take 1 tablet (12.5 mg total) by mouth 2 (two) times daily. 07/31/18   Dorrell, Andree Elk, MD  colchicine 0.6 MG tablet TAKE 1 TABLET BY MOUTH  DAILY AS NEEDED  FOR GOUT  FLARE 12/06/18   Aslam, Loralyn Freshwater, MD  empagliflozin (JARDIANCE) 10 MG TABS tablet Take 10 mg by mouth daily. 06/05/18   Dorrell, Andree Elk, MD  fluticasone (FLONASE) 50 MCG/ACT nasal spray Place 1-2 sprays into both nostrils daily for 7 days. 04/24/19 05/01/19  Ezeriah Luty C, PA-C  glipiZIDE (GLUCOTROL) 10 MG tablet TAKE 1 TABLET BY MOUTH  TWICE A DAY BEFORE A MEAL 07/31/18   Dorrell, Andree Elk, MD  ibuprofen (ADVIL) 600 MG tablet Take 1 tablet (600 mg total) by mouth every 6 (six) hours as needed. 04/24/19   Vladislav Axelson C, PA-C  Insulin Pen Needle 32G X 4 MM MISC Use to inject victoza one time a day 11/15/17   Axel Filler, MD  liraglutide (VICTOZA) 18 MG/3ML SOPN Inject 1.8 mg into the skin daily as instructed 06/05/18   Dorrell, Andree Elk, MD  nitroGLYCERIN (NITROSTAT) 0.4 MG SL tablet DISSOLVE 1 TABLET UNDER THE TONGUE EVERY 5 MINUTES AS  NEEDED FOR CHEST PAIN , MAX 3 TABLETS IN 15 MINUTES,  CALL 911 IF NO RELIEF. 06/26/18   Wellington Hampshire, MD  rosuvastatin (CRESTOR) 20 MG tablet Take 1 tablet (20 mg total) by mouth daily. 06/26/18 03/26/19  Wellington Hampshire, MD  tamsulosin (FLOMAX) 0.4 MG CAPS capsule Take 1 capsule (0.4 mg total) by mouth daily. 12/06/18   Harvie Heck, MD  valsartan (DIOVAN) 160 MG tablet Take 1 tablet (160 mg total) by mouth daily. 08/20/18   Tanda Rockers, MD    Family History Family History  Problem Relation Age of Onset  . Diabetes Mother   . Coronary artery disease Mother        HAS PACEMAKER  . Heart disease Mother   . Heart attack Brother   . Lung disease Neg Hx     Social History Social History   Tobacco Use  . Smoking status: Former Smoker    Packs/day: 1.00    Years: 32.00    Pack years: 32.00    Types: Cigarettes    Quit date: 07/06/2007    Years since quitting: 11.8  . Smokeless tobacco: Never Used  Substance Use Topics  . Alcohol use: Yes    Alcohol/week: 0.0 standard drinks    Comment: Sometimes.  . Drug use: No      Allergies   Morphine and Pravastatin sodium   Review of Systems Review of Systems  Constitutional: Positive for appetite change and fatigue. Negative for activity change, chills and fever.  HENT: Positive for congestion and rhinorrhea. Negative for ear pain, sinus pressure, sore throat and trouble swallowing.   Eyes: Negative for discharge and redness.  Respiratory: Positive for cough. Negative for chest tightness and shortness of breath.   Cardiovascular:  Negative for chest pain.  Gastrointestinal: Negative for abdominal pain, diarrhea, nausea and vomiting.  Musculoskeletal: Negative for myalgias.  Skin: Negative for rash.  Neurological: Negative for dizziness, light-headedness and headaches.     Physical Exam Triage Vital Signs ED Triage Vitals  Enc Vitals Group     BP 04/24/19 2021 (!) 85/58     Pulse Rate 04/24/19 2021 95     Resp 04/24/19 2021 18     Temp 04/24/19 2021 (!) 100.9 F (38.3 C)     Temp Source 04/24/19 2021 Oral     SpO2 04/24/19 2021 95 %     Weight --      Height --      Head Circumference --      Peak Flow --      Pain Score 04/24/19 2018 0     Pain Loc --      Pain Edu? --      Excl. in GC? --    No data found.  Updated Vital Signs BP 112/63 (BP Location: Left Arm)   Pulse 92   Temp (!) 100.9 F (38.3 C) (Oral)   Resp 18   SpO2 95%  Oxygen rechecked and was 97-98%.  Visual Acuity Right Eye Distance:   Left Eye Distance:   Bilateral Distance:    Right Eye Near:   Left Eye Near:    Bilateral Near:     Physical Exam Vitals and nursing note reviewed.  Constitutional:      Appearance: He is well-developed.  HENT:     Head: Normocephalic and atraumatic.     Ears:     Comments: Bilateral ears without tenderness to palpation of external auricle, tragus and mastoid, EAC's without erythema or swelling, TM's with good bony landmarks and cone of light. Non erythematous.     Nose:     Comments: Nasal mucosa erythematous, swollen  turbinates bilaterally    Mouth/Throat:     Comments: Oral mucosa pink and moist, no tonsillar enlargement or exudate. Posterior pharynx patent and nonerythematous, no uvula deviation or swelling. Normal phonation. Eyes:     Conjunctiva/sclera: Conjunctivae normal.  Cardiovascular:     Rate and Rhythm: Normal rate and regular rhythm.     Heart sounds: No murmur.  Pulmonary:     Effort: Pulmonary effort is normal. No respiratory distress.     Breath sounds: Normal breath sounds.     Comments: Breathing comfortably at rest, CTABL, no wheezing, rales or other adventitious sounds auscultated Abdominal:     Palpations: Abdomen is soft.     Tenderness: There is no abdominal tenderness.  Musculoskeletal:     Cervical back: Neck supple.  Skin:    General: Skin is warm and dry.  Neurological:     Mental Status: He is alert.      UC Treatments / Results  Labs (all labs ordered are listed, but only abnormal results are displayed) Labs Reviewed  NOVEL CORONAVIRUS, NAA (HOSP ORDER, SEND-OUT TO REF LAB; TAT 18-24 HRS)  POC SARS CORONAVIRUS 2 AG -  ED  POC SARS CORONAVIRUS 2 AG    EKG   Radiology No results found.  Procedures Procedures (including critical care time)  Medications Ordered in UC Medications  acetaminophen (TYLENOL) tablet 650 mg (650 mg Oral Given 04/24/19 2035)    Initial Impression / Assessment and Plan / UC Course  I have reviewed the triage vital signs and the nursing notes.  Pertinent labs & imaging results that were  available during my care of the patient were reviewed by me and considered in my medical decision making (see chart for details).     Point-of-care Covid negative, Covid PCR pending.  Symptoms still very suspicious of Covid given associated fever in clinic as well as lack of smell and taste.  Initial blood pressure reading low, repeated with a larger cuff and improved to 112/63 and 116/72.  This is reassuring although still lower than patient's  typical blood pressure.  He denies any symptoms of lightheaded notes or dizziness.  We will continue treatment for likely Covid and recommending continuing symptomatic and supportive care, Tylenol and ibuprofen as needed for fevers, Flonase for congestion, Tessalon for cough.  Discussed concerns around blood pressure with patient, recommended to monitor at home if he is able.  Continue to keep a close eye on symptoms and follow-up if developing any lightheadedness, dizziness, increased fatigue or worsening symptoms.  Do not suspect pneumonia at this time as he has improving URI symptoms, stable O2; would recommend chest x-ray if Covid PCR negative and continuing to have fevers.  Discussed strict return precautions. Patient verbalized understanding and is agreeable with plan.  Final Clinical Impressions(s) / UC Diagnoses   Final diagnoses:  Fatigue, unspecified type  Loss of perception for taste  Suspected COVID-19 virus infection     Discharge Instructions     Rapid Covid negative, second Covid swab pending.  Please quarantine until results return. Please rest and drink plenty of fluids Alternate Tylenol and ibuprofen every 4 hours for better management of fever, body aches and headaches May use Tessalon as needed every 8 hours for cough Flonase nasal spray 1 to 2 spray in each nostril daily for congestion  Please follow-up in the emergency room if developing increased fatigue, weakness, dizziness, lightheadedness, shortness of breath or difficulty breathing   ED Prescriptions    Medication Sig Dispense Auth. Provider   benzonatate (TESSALON) 200 MG capsule Take 1 capsule (200 mg total) by mouth 3 (three) times daily as needed for up to 7 days for cough. 28 capsule Judene Logue C, PA-C   ibuprofen (ADVIL) 600 MG tablet Take 1 tablet (600 mg total) by mouth every 6 (six) hours as needed. 30 tablet Rooney Swails C, PA-C   fluticasone (FLONASE) 50 MCG/ACT nasal spray Place 1-2 sprays  into both nostrils daily for 7 days. 1 g Skiler Olden, Gardiner C, PA-C     PDMP not reviewed this encounter.   Lew Dawes, [place] 04/25/19 [phone]

## 2019-04-28 ENCOUNTER — Encounter (HOSPITAL_COMMUNITY): Payer: Self-pay

## 2019-04-28 ENCOUNTER — Emergency Department (HOSPITAL_COMMUNITY): Payer: Medicare Other

## 2019-04-28 ENCOUNTER — Inpatient Hospital Stay (HOSPITAL_COMMUNITY)
Admission: EM | Admit: 2019-04-28 | Discharge: 2019-05-15 | DRG: 177 | Disposition: A | Discharge status: Home Health | Payer: Medicare Other | Attending: Family Medicine | Admitting: Family Medicine

## 2019-04-28 ENCOUNTER — Other Ambulatory Visit: Payer: Self-pay

## 2019-04-28 DIAGNOSIS — E1122 Type 2 diabetes mellitus with diabetic chronic kidney disease: Secondary | ICD-10-CM | POA: Diagnosis present

## 2019-04-28 DIAGNOSIS — Z7982 Long term (current) use of aspirin: Secondary | ICD-10-CM

## 2019-04-28 DIAGNOSIS — J1282 Pneumonia due to coronavirus disease 2019: Secondary | ICD-10-CM

## 2019-04-28 DIAGNOSIS — R0902 Hypoxemia: Secondary | ICD-10-CM | POA: Diagnosis not present

## 2019-04-28 DIAGNOSIS — Z87891 Personal history of nicotine dependence: Secondary | ICD-10-CM | POA: Diagnosis not present

## 2019-04-28 DIAGNOSIS — U071 COVID-19: Principal | ICD-10-CM

## 2019-04-28 DIAGNOSIS — M109 Gout, unspecified: Secondary | ICD-10-CM | POA: Diagnosis not present

## 2019-04-28 DIAGNOSIS — Z79899 Other long term (current) drug therapy: Secondary | ICD-10-CM

## 2019-04-28 DIAGNOSIS — Z833 Family history of diabetes mellitus: Secondary | ICD-10-CM

## 2019-04-28 DIAGNOSIS — E871 Hypo-osmolality and hyponatremia: Secondary | ICD-10-CM | POA: Diagnosis present

## 2019-04-28 DIAGNOSIS — R0602 Shortness of breath: Secondary | ICD-10-CM | POA: Diagnosis not present

## 2019-04-28 DIAGNOSIS — E118 Type 2 diabetes mellitus with unspecified complications: Secondary | ICD-10-CM | POA: Diagnosis not present

## 2019-04-28 DIAGNOSIS — Z8701 Personal history of pneumonia (recurrent): Secondary | ICD-10-CM | POA: Diagnosis not present

## 2019-04-28 DIAGNOSIS — E119 Type 2 diabetes mellitus without complications: Secondary | ICD-10-CM | POA: Diagnosis not present

## 2019-04-28 DIAGNOSIS — I252 Old myocardial infarction: Secondary | ICD-10-CM | POA: Diagnosis not present

## 2019-04-28 DIAGNOSIS — E669 Obesity, unspecified: Secondary | ICD-10-CM | POA: Diagnosis present

## 2019-04-28 DIAGNOSIS — I10 Essential (primary) hypertension: Secondary | ICD-10-CM | POA: Diagnosis not present

## 2019-04-28 DIAGNOSIS — K760 Fatty (change of) liver, not elsewhere classified: Secondary | ICD-10-CM | POA: Diagnosis not present

## 2019-04-28 DIAGNOSIS — Z794 Long term (current) use of insulin: Secondary | ICD-10-CM

## 2019-04-28 DIAGNOSIS — R5381 Other malaise: Secondary | ICD-10-CM | POA: Diagnosis not present

## 2019-04-28 DIAGNOSIS — J9601 Acute respiratory failure with hypoxia: Secondary | ICD-10-CM | POA: Diagnosis not present

## 2019-04-28 DIAGNOSIS — Z6836 Body mass index (BMI) 36.0-36.9, adult: Secondary | ICD-10-CM | POA: Diagnosis not present

## 2019-04-28 DIAGNOSIS — J069 Acute upper respiratory infection, unspecified: Secondary | ICD-10-CM | POA: Diagnosis not present

## 2019-04-28 DIAGNOSIS — R069 Unspecified abnormalities of breathing: Secondary | ICD-10-CM | POA: Diagnosis not present

## 2019-04-28 DIAGNOSIS — T380X5A Adverse effect of glucocorticoids and synthetic analogues, initial encounter: Secondary | ICD-10-CM | POA: Diagnosis not present

## 2019-04-28 DIAGNOSIS — E1165 Type 2 diabetes mellitus with hyperglycemia: Secondary | ICD-10-CM | POA: Diagnosis present

## 2019-04-28 DIAGNOSIS — J984 Other disorders of lung: Secondary | ICD-10-CM | POA: Diagnosis not present

## 2019-04-28 DIAGNOSIS — E785 Hyperlipidemia, unspecified: Secondary | ICD-10-CM | POA: Diagnosis not present

## 2019-04-28 DIAGNOSIS — J1289 Other viral pneumonia: Secondary | ICD-10-CM | POA: Diagnosis not present

## 2019-04-28 DIAGNOSIS — R0689 Other abnormalities of breathing: Secondary | ICD-10-CM | POA: Diagnosis not present

## 2019-04-28 DIAGNOSIS — N400 Enlarged prostate without lower urinary tract symptoms: Secondary | ICD-10-CM | POA: Diagnosis present

## 2019-04-28 DIAGNOSIS — Z8249 Family history of ischemic heart disease and other diseases of the circulatory system: Secondary | ICD-10-CM | POA: Diagnosis not present

## 2019-04-28 DIAGNOSIS — E0865 Diabetes mellitus due to underlying condition with hyperglycemia: Secondary | ICD-10-CM | POA: Diagnosis present

## 2019-04-28 DIAGNOSIS — N182 Chronic kidney disease, stage 2 (mild): Secondary | ICD-10-CM | POA: Diagnosis present

## 2019-04-28 DIAGNOSIS — I2510 Atherosclerotic heart disease of native coronary artery without angina pectoris: Secondary | ICD-10-CM | POA: Diagnosis present

## 2019-04-28 DIAGNOSIS — E66812 Obesity, class 2: Secondary | ICD-10-CM | POA: Diagnosis present

## 2019-04-28 DIAGNOSIS — M2550 Pain in unspecified joint: Secondary | ICD-10-CM | POA: Diagnosis not present

## 2019-04-28 DIAGNOSIS — I129 Hypertensive chronic kidney disease with stage 1 through stage 4 chronic kidney disease, or unspecified chronic kidney disease: Secondary | ICD-10-CM | POA: Diagnosis not present

## 2019-04-28 DIAGNOSIS — Z743 Need for continuous supervision: Secondary | ICD-10-CM | POA: Diagnosis not present

## 2019-04-28 DIAGNOSIS — Z7401 Bed confinement status: Secondary | ICD-10-CM | POA: Diagnosis not present

## 2019-04-28 HISTORY — DX: Pneumonia due to coronavirus disease 2019: J12.82

## 2019-04-28 HISTORY — DX: COVID-19: U07.1

## 2019-04-28 LAB — CBC WITH DIFFERENTIAL/PLATELET
Abs Immature Granulocytes: 0.05 10*3/uL (ref 0.00–0.07)
Basophils Absolute: 0 10*3/uL (ref 0.0–0.1)
Basophils Relative: 0 %
Eosinophils Absolute: 0 10*3/uL (ref 0.0–0.5)
Eosinophils Relative: 0 %
HCT: 42 % (ref 39.0–52.0)
Hemoglobin: 13.5 g/dL (ref 13.0–17.0)
Immature Granulocytes: 1 %
Lymphocytes Relative: 13 %
Lymphs Abs: 1.1 10*3/uL (ref 0.7–4.0)
MCH: 21.6 pg — ABNORMAL LOW (ref 26.0–34.0)
MCHC: 32.1 g/dL (ref 30.0–36.0)
MCV: 67.3 fL — ABNORMAL LOW (ref 80.0–100.0)
Monocytes Absolute: 1.1 10*3/uL — ABNORMAL HIGH (ref 0.1–1.0)
Monocytes Relative: 13 %
Neutro Abs: 6 10*3/uL (ref 1.7–7.7)
Neutrophils Relative %: 73 %
Platelets: 295 10*3/uL (ref 150–400)
RBC: 6.24 MIL/uL — ABNORMAL HIGH (ref 4.22–5.81)
RDW: 14.4 % (ref 11.5–15.5)
WBC: 8.2 10*3/uL (ref 4.0–10.5)
nRBC: 0 % (ref 0.0–0.2)

## 2019-04-28 LAB — COMPREHENSIVE METABOLIC PANEL
ALT: 34 U/L (ref 0–44)
AST: 60 U/L — ABNORMAL HIGH (ref 15–41)
Albumin: 2.8 g/dL — ABNORMAL LOW (ref 3.5–5.0)
Alkaline Phosphatase: 66 U/L (ref 38–126)
Anion gap: 12 (ref 5–15)
BUN: 15 mg/dL (ref 8–23)
CO2: 19 mmol/L — ABNORMAL LOW (ref 22–32)
Calcium: 8.4 mg/dL — ABNORMAL LOW (ref 8.9–10.3)
Chloride: 100 mmol/L (ref 98–111)
Creatinine, Ser: 1.28 mg/dL — ABNORMAL HIGH (ref 0.61–1.24)
GFR calc Af Amer: >60 mL/min (ref >60)
GFR calc non Af Amer: 60 mL/min — ABNORMAL LOW (ref >60)
Glucose, Bld: 265 mg/dL — ABNORMAL HIGH (ref 70–99)
Potassium: 4 mmol/L (ref 3.5–5.1)
Sodium: 131 mmol/L — ABNORMAL LOW (ref 135–145)
Total Bilirubin: 0.5 mg/dL (ref 0.3–1.2)
Total Protein: 8.4 g/dL — ABNORMAL HIGH (ref 6.5–8.1)

## 2019-04-28 LAB — HIV ANTIBODY (ROUTINE TESTING W REFLEX): HIV Screen 4th Generation wRfx: NONREACTIVE

## 2019-04-28 LAB — C-REACTIVE PROTEIN
CRP: 20 mg/dL — ABNORMAL HIGH (ref <1.0)
CRP: 21 mg/dL — ABNORMAL HIGH (ref <1.0)

## 2019-04-28 LAB — GLUCOSE, CAPILLARY
Glucose-Capillary: 300 mg/dL — ABNORMAL HIGH (ref 70–99)
Glucose-Capillary: 290 mg/dL — ABNORMAL HIGH (ref 70–99)

## 2019-04-28 LAB — D-DIMER, QUANTITATIVE
D-Dimer, Quant: 2.19 ug/mL-FEU — ABNORMAL HIGH (ref 0.00–0.50)
D-Dimer, Quant: 2.11 ug/mL-FEU — ABNORMAL HIGH (ref 0.00–0.50)

## 2019-04-28 LAB — FERRITIN: Ferritin: 650 ng/mL — ABNORMAL HIGH (ref 24–336)

## 2019-04-28 LAB — TRIGLYCERIDES: Triglycerides: 93 mg/dL (ref <150)

## 2019-04-28 LAB — TROPONIN I (HIGH SENSITIVITY): Troponin I (High Sensitivity): 12 ng/L (ref <18)

## 2019-04-28 LAB — CBG MONITORING, ED: Glucose-Capillary: 212 mg/dL — ABNORMAL HIGH (ref 70–99)

## 2019-04-28 LAB — BRAIN NATRIURETIC PEPTIDE: B Natriuretic Peptide: 34.6 pg/mL (ref 0.0–100.0)

## 2019-04-28 LAB — FIBRINOGEN: Fibrinogen: >800 mg/dL — ABNORMAL HIGH (ref 210–475)

## 2019-04-28 LAB — PROCALCITONIN: Procalcitonin: 0.34 ng/mL

## 2019-04-28 LAB — LACTIC ACID, PLASMA: Lactic Acid, Venous: 2.2 mmol/L (ref 0.5–1.9)

## 2019-04-28 LAB — LACTATE DEHYDROGENASE: LDH: 349 U/L — ABNORMAL HIGH (ref 98–192)

## 2019-04-28 MED ORDER — LINAGLIPTIN 5 MG PO TABS
5.0000 mg | ORAL_TABLET | Freq: Every day | ORAL | Status: DC
  Administered 2019-04-28 – 2019-05-15 (×18): 5 mg via ORAL
  Filled 2019-04-28 (×18): qty 1

## 2019-04-28 MED ORDER — DEXAMETHASONE SODIUM PHOSPHATE 10 MG/ML IJ SOLN
6.0000 mg | Freq: Once | INTRAMUSCULAR | Status: AC
  Administered 2019-04-28: 10:00:00 6 mg via INTRAVENOUS
  Filled 2019-04-28: qty 1

## 2019-04-28 MED ORDER — INSULIN ASPART 100 UNIT/ML ~~LOC~~ SOLN
0.0000 [IU] | Freq: Three times a day (TID) | SUBCUTANEOUS | Status: DC
  Administered 2019-04-29: 4 [IU] via SUBCUTANEOUS
  Administered 2019-04-29 (×2): 7 [IU] via SUBCUTANEOUS
  Administered 2019-04-30: 15 [IU] via SUBCUTANEOUS
  Administered 2019-04-30: 4 [IU] via SUBCUTANEOUS
  Administered 2019-04-30: 11 [IU] via SUBCUTANEOUS
  Administered 2019-05-01: 4 [IU] via SUBCUTANEOUS
  Administered 2019-05-01 (×2): 11 [IU] via SUBCUTANEOUS
  Administered 2019-05-02: 15 [IU] via SUBCUTANEOUS
  Administered 2019-05-02: 3 [IU] via SUBCUTANEOUS
  Administered 2019-05-02: 15 [IU] via SUBCUTANEOUS
  Administered 2019-05-03: 3 [IU] via SUBCUTANEOUS
  Administered 2019-05-03 – 2019-05-05 (×4): 20 [IU] via SUBCUTANEOUS
  Administered 2019-05-05: 4 [IU] via SUBCUTANEOUS
  Administered 2019-05-05: 15 [IU] via SUBCUTANEOUS
  Administered 2019-05-06: 4 [IU] via SUBCUTANEOUS
  Administered 2019-05-06 (×2): 20 [IU] via SUBCUTANEOUS
  Administered 2019-05-07: 7 [IU] via SUBCUTANEOUS
  Administered 2019-05-07: 4 [IU] via SUBCUTANEOUS
  Administered 2019-05-07: 3 [IU] via SUBCUTANEOUS
  Administered 2019-05-08 – 2019-05-09 (×4): 4 [IU] via SUBCUTANEOUS
  Administered 2019-05-09: 3 [IU] via SUBCUTANEOUS
  Administered 2019-05-10: 4 [IU] via SUBCUTANEOUS
  Administered 2019-05-10 (×2): 3 [IU] via SUBCUTANEOUS
  Administered 2019-05-11 (×2): 4 [IU] via SUBCUTANEOUS
  Administered 2019-05-11 – 2019-05-13 (×5): 3 [IU] via SUBCUTANEOUS
  Administered 2019-05-14: 4 [IU] via SUBCUTANEOUS
  Administered 2019-05-14 – 2019-05-15 (×3): 3 [IU] via SUBCUTANEOUS

## 2019-04-28 MED ORDER — ENOXAPARIN SODIUM 40 MG/0.4ML ~~LOC~~ SOLN
40.0000 mg | SUBCUTANEOUS | Status: DC
  Administered 2019-04-28: 40 mg via SUBCUTANEOUS
  Filled 2019-04-28: qty 0.4

## 2019-04-28 MED ORDER — SODIUM CHLORIDE 0.9 % IV SOLN
2.0000 g | INTRAVENOUS | Status: AC
  Administered 2019-04-28 – 2019-05-02 (×5): 2 g via INTRAVENOUS
  Filled 2019-04-28 (×5): qty 20

## 2019-04-28 MED ORDER — ALLOPURINOL 100 MG PO TABS
100.0000 mg | ORAL_TABLET | Freq: Every day | ORAL | Status: DC
  Administered 2019-04-28 – 2019-05-15 (×18): 100 mg via ORAL
  Filled 2019-04-28 (×18): qty 1

## 2019-04-28 MED ORDER — OXYCODONE HCL 5 MG PO TABS
5.0000 mg | ORAL_TABLET | ORAL | Status: DC | PRN
  Administered 2019-05-10 – 2019-05-14 (×4): 5 mg via ORAL
  Filled 2019-04-28 (×4): qty 1

## 2019-04-28 MED ORDER — CARVEDILOL 12.5 MG PO TABS
12.5000 mg | ORAL_TABLET | Freq: Two times a day (BID) | ORAL | Status: DC
  Administered 2019-04-28 – 2019-05-15 (×30): 12.5 mg via ORAL
  Filled 2019-04-28 (×32): qty 1

## 2019-04-28 MED ORDER — SODIUM CHLORIDE 0.9% FLUSH
3.0000 mL | Freq: Two times a day (BID) | INTRAVENOUS | Status: DC
  Administered 2019-04-28 (×2): 3 mL via INTRAVENOUS

## 2019-04-28 MED ORDER — TAMSULOSIN HCL 0.4 MG PO CAPS
0.4000 mg | ORAL_CAPSULE | Freq: Every day | ORAL | Status: DC
  Administered 2019-04-28 – 2019-05-15 (×18): 0.4 mg via ORAL
  Filled 2019-04-28 (×18): qty 1

## 2019-04-28 MED ORDER — FLEET ENEMA 7-19 GM/118ML RE ENEM
1.0000 | ENEMA | Freq: Once | RECTAL | Status: AC | PRN
  Administered 2019-05-12: 1 via RECTAL
  Filled 2019-04-28: qty 1

## 2019-04-28 MED ORDER — INSULIN ASPART 100 UNIT/ML ~~LOC~~ SOLN
0.0000 [IU] | Freq: Every day | SUBCUTANEOUS | Status: DC
  Administered 2019-04-28: 23:00:00 3 [IU] via SUBCUTANEOUS
  Administered 2019-04-29: 2 [IU] via SUBCUTANEOUS
  Administered 2019-04-30: 5 [IU] via SUBCUTANEOUS
  Administered 2019-05-01: 4 [IU] via SUBCUTANEOUS
  Administered 2019-05-02 – 2019-05-03 (×2): 3 [IU] via SUBCUTANEOUS
  Administered 2019-05-04: 5 [IU] via SUBCUTANEOUS
  Administered 2019-05-05: 3 [IU] via SUBCUTANEOUS
  Administered 2019-05-09: 2 [IU] via SUBCUTANEOUS

## 2019-04-28 MED ORDER — FLUTICASONE PROPIONATE 50 MCG/ACT NA SUSP: 1.0000 | Freq: Every day | NASAL | Status: DC

## 2019-04-28 MED ORDER — SODIUM CHLORIDE 0.9 % IV SOLN
500.0000 mg | INTRAVENOUS | Status: AC
  Administered 2019-04-28 – 2019-05-02 (×5): 500 mg via INTRAVENOUS
  Filled 2019-04-28 (×5): qty 500

## 2019-04-28 MED ORDER — BENZONATATE 100 MG PO CAPS: 200.0000 mg | ORAL_CAPSULE | Freq: Three times a day (TID) | ORAL | Status: DC | PRN

## 2019-04-28 MED ORDER — SODIUM CHLORIDE 0.9 % IV SOLN
200.0000 mg | Freq: Once | INTRAVENOUS | Status: AC
  Administered 2019-04-28: 200 mg via INTRAVENOUS
  Filled 2019-04-28: qty 40

## 2019-04-28 MED ORDER — INSULIN ASPART 100 UNIT/ML ~~LOC~~ SOLN: 0.0000 [IU] | Freq: Every day | SUBCUTANEOUS | Status: DC

## 2019-04-28 MED ORDER — SODIUM CHLORIDE 0.9% FLUSH: 3.0000 mL | INTRAVENOUS | Status: DC | PRN

## 2019-04-28 MED ORDER — ROSUVASTATIN CALCIUM 5 MG PO TABS
20.0000 mg | ORAL_TABLET | Freq: Every day | ORAL | Status: DC
  Administered 2019-04-28 – 2019-05-15 (×18): 20 mg via ORAL
  Filled 2019-04-28: qty 1
  Filled 2019-04-28 (×3): qty 4
  Filled 2019-04-28: qty 1
  Filled 2019-04-28: qty 4
  Filled 2019-04-28: qty 1
  Filled 2019-04-28 (×3): qty 4
  Filled 2019-04-28 (×2): qty 1
  Filled 2019-04-28: qty 4
  Filled 2019-04-28: qty 1
  Filled 2019-04-28 (×5): qty 4

## 2019-04-28 MED ORDER — METHYLPREDNISOLONE SODIUM SUCC 125 MG IJ SOLR: 0.5000 mg/kg | Freq: Two times a day (BID) | INTRAMUSCULAR | Status: DC

## 2019-04-28 MED ORDER — INSULIN ASPART 100 UNIT/ML ~~LOC~~ SOLN
0.0000 [IU] | Freq: Three times a day (TID) | SUBCUTANEOUS | Status: DC
  Administered 2019-04-28: 5 [IU] via SUBCUTANEOUS

## 2019-04-28 MED ORDER — POLYETHYLENE GLYCOL 3350 17 G PO PACK
17.0000 g | PACK | Freq: Every day | ORAL | Status: DC | PRN
  Administered 2019-04-30: 17 g via ORAL
  Filled 2019-04-28 (×2): qty 1

## 2019-04-28 MED ORDER — AMLODIPINE BESYLATE 5 MG PO TABS
5.0000 mg | ORAL_TABLET | Freq: Every day | ORAL | Status: DC
  Administered 2019-04-28 – 2019-05-15 (×17): 5 mg via ORAL
  Filled 2019-04-28 (×18): qty 1

## 2019-04-28 MED ORDER — SODIUM CHLORIDE 0.9 % IV SOLN
100.0000 mg | Freq: Every day | INTRAVENOUS | Status: AC
  Administered 2019-04-29 – 2019-05-02 (×4): 100 mg via INTRAVENOUS
  Filled 2019-04-28 (×4): qty 20

## 2019-04-28 MED ORDER — INSULIN GLARGINE 100 UNIT/ML ~~LOC~~ SOLN
18.0000 [IU] | Freq: Every day | SUBCUTANEOUS | Status: DC
  Administered 2019-04-28 – 2019-04-29 (×2): 18 [IU] via SUBCUTANEOUS
  Filled 2019-04-28 (×2): qty 0.18

## 2019-04-28 MED ORDER — BISACODYL 5 MG PO TBEC: 5.0000 mg | DELAYED_RELEASE_TABLET | Freq: Every day | ORAL | Status: DC | PRN

## 2019-04-28 MED ORDER — IOHEXOL 350 MG/ML SOLN
75.0000 mL | Freq: Once | INTRAVENOUS | Status: AC | PRN
  Administered 2019-04-28: 12:00:00 75 mL via INTRAVENOUS

## 2019-04-28 MED ORDER — DEXAMETHASONE SODIUM PHOSPHATE 10 MG/ML IJ SOLN
6.0000 mg | INTRAMUSCULAR | Status: DC
  Administered 2019-04-29 – 2019-05-06 (×8): 6 mg via INTRAVENOUS
  Filled 2019-04-28 (×9): qty 1

## 2019-04-28 MED ORDER — ACETAMINOPHEN 325 MG PO TABS
650.0000 mg | ORAL_TABLET | Freq: Four times a day (QID) | ORAL | Status: DC | PRN
  Administered 2019-05-03 – 2019-05-14 (×2): 650 mg via ORAL
  Filled 2019-04-28 (×2): qty 2

## 2019-04-28 MED ORDER — ONDANSETRON HCL 4 MG/2ML IJ SOLN: 4.0000 mg | Freq: Four times a day (QID) | INTRAMUSCULAR | Status: DC | PRN

## 2019-04-28 MED ORDER — SODIUM CHLORIDE 0.9 % IV SOLN: 250.0000 mL | INTRAVENOUS | Status: DC | PRN

## 2019-04-28 MED ORDER — ASPIRIN EC 81 MG PO TBEC
81.0000 mg | DELAYED_RELEASE_TABLET | Freq: Every day | ORAL | Status: DC
  Administered 2019-04-28 – 2019-05-15 (×18): 81 mg via ORAL
  Filled 2019-04-28 (×18): qty 1

## 2019-04-28 MED ORDER — ONDANSETRON HCL 4 MG PO TABS
4.0000 mg | ORAL_TABLET | Freq: Four times a day (QID) | ORAL | Status: DC | PRN
  Filled 2019-04-28: qty 1

## 2019-04-28 MED ORDER — SODIUM CHLORIDE 0.9% FLUSH: 3.0000 mL | Freq: Two times a day (BID) | INTRAVENOUS | Status: DC

## 2019-04-28 MED ORDER — ALBUTEROL SULFATE HFA 108 (90 BASE) MCG/ACT IN AERS
2.0000 | INHALATION_SPRAY | RESPIRATORY_TRACT | Status: DC | PRN
  Administered 2019-04-29 – 2019-05-01 (×3): 2 via RESPIRATORY_TRACT
  Filled 2019-04-28: qty 6.7

## 2019-04-28 NOTE — ED Triage Notes (Signed)
Back from CT scan

## 2019-04-28 NOTE — ED Triage Notes (Signed)
CBG 212 

## 2019-04-28 NOTE — Progress Notes (Signed)
Daniel Perkins  GNF:[id number] DOB: October 13, 1955 DOA: 04/28/2019 PCP: Eliezer Bottom, MD    Brief Narrative:  63 year old with a history of DM, CAD, HTN, HLD, and bilateral clubfoot who originally presented to an urgent care 12/16 with fatigue, anorexia, and loss of taste and was ultimately found to be Covid positive.  Since that time he has developed increasing shortness of breath with refractory cough.  He presented to the [facility] ED 12/20 where a CXR noted bilateral pulmonary infiltrates and he required 10 L high flow nasal cannula oxygen to keep his saturations in the mid 90s.  Significant Events: 12/16 Covid positive at [facility] urgent care 12/20 admit via [place] -transfer to [facility]  COVID-19 specific Treatment: Remdesivir 12/20 > Decadron 12/20 >  Antimicrobials:  Azithromycin 12/20 > Ceftriaxone 12/20 >  Subjective: Patient seen for follow-up visit upon arrival at [facility].  Assessment & Plan:  COVID Pneumonia -acute hypoxic respiratory failure  Recent Labs  Lab 04/28/19 0939 04/28/19 1602  DDIMER 2.19* 2.11*  FERRITIN 650*  --   CRP 20.0* 21.0*  ALT 34  --   PROCALCITON 0.34  --     DM 2 uncontrolled with hyperglycemia  HTN  CKD stage II  Hyponatremia  Hepatic steatosis Noted on CT chest  HLD  BPH  DVT prophylaxis: Lovenox  Code Status: FULL CODE Family Communication:  Disposition Plan:   Consultants:  none   Objective: Blood pressure (!) 150/106, pulse 94, temperature 99.6 F (37.6 C), temperature source Oral, resp. rate 18, height 5\' 8"  (1.727 m), weight 108.9 kg, SpO2 95 %.  Intake/Output Summary (Last 24 hours) at 04/28/2019 1720 Last data filed at 04/28/2019 1535 Gross per 24 hour  Intake 618.67 ml  Output --  Net 618.67 ml   Filed Weights   04/28/19 0919  Weight: 108.9 kg    Examination: Seen for follow-up visit upon arrival at [facility].  CBC: Recent Labs  Lab 04/28/19 0939  WBC 8.2  NEUTROABS 6.0  HGB 13.5   HCT 42.0  MCV 67.3*  PLT 295   Basic Metabolic Panel: Recent Labs  Lab 04/28/19 0939  NA 131*  K 4.0  CL 100  CO2 19*  GLUCOSE 265*  BUN 15  CREATININE 1.28*  CALCIUM 8.4*   GFR: Estimated Creatinine Clearance: 71.6 mL/min (A) (by C-G formula based on SCr of 1.28 mg/dL (H)).  Liver Function Tests: Recent Labs  Lab 04/28/19 0939  AST 60*  ALT 34  ALKPHOS 66  BILITOT 0.5  PROT 8.4*  ALBUMIN 2.8*    HbA1C: Hemoglobin A1C  Date/Time Value Ref Range Status  12/06/2018 05:03 PM 7.4 (A) 4.0 - 5.6 % Final  06/05/2018 04:44 PM 9.7 (A) 4.0 - 5.6 % Final   Hgb A1c MFr Bld  Date/Time Value Ref Range Status  03/22/2016 04:13 PM 10.4 (H) 4.8 - 5.6 % Final    Comment:             Pre-diabetes: 5.7 - 6.4          Diabetes: >6.4          Glycemic control for adults with diabetes: <7.0   10/28/2010 12:30 AM 7.4 (H) <5.7 % Final    Comment:    (NOTE)  According to the ADA Clinical Practice Recommendations for 2011, when HbA1c is used as a screening test:  >=6.5%   Diagnostic of Diabetes Mellitus           (if abnormal result is confirmed) 5.7-6.4%   Increased risk of developing Diabetes Mellitus References:Diagnosis and Classification of Diabetes Mellitus,Diabetes IONG,2952,84(XLKGM 1):S62-S69 and Standards of Medical Care in         Diabetes - 2011,Diabetes WNUU,7253,66 (Suppl 1):S11-S61.    CBG: Recent Labs  Lab 04/28/19 1612  GLUCAP 212*    Recent Results (from the past 240 hour(s))  Novel Coronavirus, NAA (Hosp order, Send-out to Ref Lab; TAT 18-24 hrs     Status: Abnormal   Collection Time: 04/24/19  8:54 PM   Specimen: Nasopharyngeal Swab; Respiratory  Result Value Ref Range Status   SARS-CoV-2, NAA DETECTED (A) NOT DETECTED Final    Comment: (NOTE)                  Client Requested Flag This nucleic acid amplification test was developed and its performance characteristics determined  by [employer]. Nucleic acid amplification tests include PCR and TMA. This test has not been FDA cleared or approved. This test has been authorized by FDA under an Emergency Use Authorization (EUA). This test is only authorized for the duration of time the declaration that circumstances exist justifying the authorization of the emergency use of in vitro diagnostic tests for detection of SARS-CoV-2 virus and/or diagnosis of COVID-19 infection under section 564(b)(1) of the Act, 21 U.S.C. 440HKV-4(Q) (1), unless the authorization is terminated or revoked sooner. When diagnostic testing is negative, the possibility of a false negative result should be considered in the context of a patient's recent exposures and the presence of clinical signs and symptoms consistent with COVID-19. An individual without symptoms of COVID-  19 and who is not shedding SARS-CoV-2 virus would expect to have a negative (not detected) result in this assay. Performed At: [facility] [address] [place], [place] [id number] Rush Farmer MD EP:[id number]    [place]  Final    Comment: Performed at [facility], [place] [address] [place], [place] [address]     Scheduled Meds: . [START ON 04/29/2019] dexamethasone (DECADRON) injection  6 mg Intravenous Q24H  . enoxaparin (LOVENOX) injection  40 mg Subcutaneous Q24H  . insulin aspart  0-15 Units Subcutaneous TID WC  . insulin aspart  0-5 Units Subcutaneous QHS  . sodium chloride flush  3 mL Intravenous Q12H  . sodium chloride flush  3 mL Intravenous Q12H   Continuous Infusions: . sodium chloride    . azithromycin 500 mg (04/28/19 1625)  . cefTRIAXone (ROCEPHIN)  IV Stopped (04/28/19 1643)  . [START ON 04/29/2019] remdesivir 100 mg in NS 100 mL       LOS: 0 days   Cherene Altes, MD [facility] Office  [phone] Pager - Text Page per Amion  If 7PM-7AM, please contact night-coverage per  Amion 04/28/2019, 5:20 PM

## 2019-04-28 NOTE — ED Triage Notes (Signed)
Pt BIB [facility] for shortness of breath x2 days. Pt positive for COVID and reports he has been having symptoms for about a week now. EMS reported room air SpO2 of 80%, patient placed on 15L via NRB by EMS and arrived with SpO2 of 96% on 10L via NRB. EMS gave 1000mg  of PO tylenol for a fever of 102.6. Pt is alert and oriented x4 at present, sinus tach at 119 with respirations of 30.

## 2019-04-28 NOTE — H&P (Signed)
History and Physical    Mickie Kozikowski BJS:[id number] DOB: 02-09-1956 DOA: 04/28/2019  PCP: Harvie Heck, MD Consultants:  Fletcher Anon - cardiology; Wert - pulmonology; Designer, industrial/product - podiatry Patient coming from:  Home - lives alone; NOK: Spero Geralds, Tat Momoli  Chief Complaint: SOB with known COVID-19 infection  HPI: Daniel Perkins is a 63 y.o. male with medical history significant of DM; CAD; HTN; HLD; and B club foot with worsening COVID-19 symptoms.  He presented to the UC on 12/16 with fatigue, anorexia, and loss of smell and taste.  POC COVID test was negative, but [facility] testing was positive for COVID-19 infection.   He has had increasing SOB, with refractory cough with conversation or ambulation.  No fever.   He has been mostly unable to eat or drink in about 6 days.  He has had increasing weakness, as a result.  He thinks he got COVID from exposure to his girlfriend, whom he picked up and took home after her d/c from [facility] on 12/12.   ED Course:  COVID + 12/16, progressive SOB, cough.  No fevers, swelling.  On 10L HFNC.  Given dexamethasone.  CTA pending to r/o PE.  Review of Systems: As per HPI; otherwise review of systems reviewed and negative.    Past Medical History:  Diagnosis Date  . Club foot of both lower extremities 1958  . Coronary artery disease    Holter monitor 02/2012 - sinus with rare PVC  . CTEV (congenital talipes equinovarus)   . Gout   . Hyperlipidemia   . Hypertension   . Myocardial infarction [facility]) ~ 2007   "mild"  . Osteomyelitis ([place])   . Pneumonia 2000's   "couple times"  . Type II diabetes mellitus ([place]) 2011    Past Surgical History:  Procedure Laterality Date  . ANTERIOR CERVICAL DECOMP/DISCECTOMY FUSION  X 2  . CARDIAC CATHETERIZATION    . FOOT FRACTURE SURGERY Right 1990's   "pt a steel plate in"  . FOOT SURGERY Left x16   "joints collapsed; keep getting infected"  . POSTERIOR CERVICAL FUSION/FORAMINOTOMY  X 2    Social History    Socioeconomic History  . Marital status: Legally Separated    Spouse name: Not on file  . Number of children: Not on file  . Years of education: 19  . Highest education level: Not on file  Occupational History    Employer: UNEMPLOYED  . Occupation: unemployed  Tobacco Use  . Smoking status: Former Smoker    Packs/day: 1.00    Years: 32.00    Pack years: 32.00    Types: Cigarettes    Quit date: 07/06/2007    Years since quitting: 11.8  . Smokeless tobacco: Never Used  Substance and Sexual Activity  . Alcohol use: Yes    Alcohol/week: 0.0 standard drinks    Comment: Sometimes.  . Drug use: No  . Sexual activity: Not on file  Other Topics Concern  . Not on file  Social History Narrative  . Not on file   Social Determinants of Health   Financial Resource Strain:   . Difficulty of Paying Living Expenses: Not on file  Food Insecurity:   . Worried About Charity fundraiser in the Last Year: Not on file  . Ran Out of Food in the Last Year: Not on file  Transportation Needs:   . Lack of Transportation (Medical): Not on file  . Lack of Transportation (Non-Medical): Not on file  Physical Activity:   . Days  of Exercise per Week: Not on file  . Minutes of Exercise per Session: Not on file  Stress:   . Feeling of Stress : Not on file  Social Connections:   . Frequency of Communication with Friends and Family: Not on file  . Frequency of Social Gatherings with Friends and Family: Not on file  . Attends Religious Services: Not on file  . Active Member of Clubs or Organizations: Not on file  . Attends Banker Meetings: Not on file  . Marital Status: Not on file  Intimate Partner Violence:   . Fear of Current or Ex-Partner: Not on file  . Emotionally Abused: Not on file  . Physically Abused: Not on file  . Sexually Abused: Not on file    Allergies  Allergen Reactions  . Morphine Itching  . Pravastatin Sodium Other (See Comments)    Severe muscle cramps with  one time requiring admission.     Family History  Problem Relation Age of Onset  . Diabetes Mother   . Coronary artery disease Mother        HAS PACEMAKER  . Heart disease Mother   . Heart attack Brother   . Lung disease Neg Hx     Prior to Admission medications   Medication Sig Start Date End Date Taking? Authorizing Provider  albuterol (VENTOLIN HFA) 108 (90 Base) MCG/ACT inhaler Inhale 2 puffs into the lungs every 4 (four) hours as needed for wheezing or shortness of breath.  08/21/18   [provider]  allopurinol (ZYLOPRIM) 100 MG tablet Take 1 tablet (100 mg total) by mouth daily. 03/28/19   Eliezer Bottom, MD  amLODipine (NORVASC) 5 MG tablet Take 1 tablet (5 mg total) by mouth daily. 03/28/19 03/27/20  Eliezer Bottom, MD  aspirin EC 81 MG tablet Take 1 tablet (81 mg total) by mouth daily. 03/19/15   Deneise Lever, MD  benzonatate (TESSALON) 200 MG capsule Take 1 capsule (200 mg total) by mouth 3 (three) times daily as needed for up to 7 days for cough. 04/24/19 05/01/19  Wieters, Hallie C, PA-C  carvedilol (COREG) 12.5 MG tablet Take 1 tablet (12.5 mg total) by mouth 2 (two) times daily. 07/31/18   Dorrell, Cathleen Corti, MD  colchicine 0.6 MG tablet TAKE 1 TABLET BY MOUTH  DAILY AS NEEDED FOR GOUT  FLARE Patient taking differently: Take 0.6 mg by mouth daily as needed (GOUT  FLARE).  12/06/18   Eliezer Bottom, MD  empagliflozin (JARDIANCE) 10 MG TABS tablet Take 10 mg by mouth daily. 06/05/18   Dorrell, Cathleen Corti, MD  fluticasone (FLONASE) 50 MCG/ACT nasal spray Place 1-2 sprays into both nostrils daily for 7 days. 04/24/19 05/01/19  Wieters, Hallie C, PA-C  glipiZIDE (GLUCOTROL) 10 MG tablet TAKE 1 TABLET BY MOUTH  TWICE A DAY BEFORE A MEAL Patient taking differently: Take 10 mg by mouth 2 (two) times daily before a meal.  07/31/18   Dorrell, Cathleen Corti, MD  ibuprofen (ADVIL) 600 MG tablet Take 1 tablet (600 mg total) by mouth every 6 (six) hours as needed. Patient taking differently:  Take 600 mg by mouth every 6 (six) hours as needed for moderate pain.  04/24/19   Wieters, Hallie C, PA-C  Insulin Pen Needle 32G X 4 MM MISC Use to inject victoza one time a day 11/15/17   Tyson Alias, MD  liraglutide (VICTOZA) 18 MG/3ML SOPN Inject 1.8 mg into the skin daily as instructed 06/05/18   Dorrell, Cathleen Corti,  MD  nitroGLYCERIN (NITROSTAT) 0.4 MG SL tablet DISSOLVE 1 TABLET UNDER THE TONGUE EVERY 5 MINUTES AS  NEEDED FOR CHEST PAIN , MAX 3 TABLETS IN 15 MINUTES,  CALL 911 IF NO RELIEF. Patient taking differently: Place 0.4 mg under the tongue every 5 (five) minutes as needed for chest pain (MAX 3 TABLETS IN 15 MINUTES,  CALL 911 IF NO RELIEF.).  06/26/18   Iran Ouch, MD  rosuvastatin (CRESTOR) 20 MG tablet Take 1 tablet (20 mg total) by mouth daily. 06/26/18 03/26/19  Iran Ouch, MD  tamsulosin (FLOMAX) 0.4 MG CAPS capsule Take 1 capsule (0.4 mg total) by mouth daily. 12/06/18   Eliezer Bottom, MD  valsartan (DIOVAN) 160 MG tablet Take 1 tablet (160 mg total) by mouth daily. 08/20/18   Nyoka Cowden, MD    Physical Exam: Vitals:   04/28/19 0945 04/28/19 1015 04/28/19 1130 04/28/19 1215  BP: 107/74   (!) 150/88  Pulse: (!) 112 (!) 103 90 96  Resp: 17 (!) 31 (!) 23 (!) 22  Temp:      TempSrc:      SpO2: 94% 97% 97% 97%  Weight:      Height:         . General:  Appears calm and comfortable and is NAD on 10L [place] O2 at rest . Eyes:  PERRL, EOMI, normal lids, iris . ENT:  grossly normal hearing, lips & tongue, mildly dry mm . Neck:  no LAD, masses or thyromegaly . Cardiovascular:  RR with tachycardia, no m/r/g.  . Respiratory:   Scattered rhonchi.  Mildly increased respiratory effort on 10L HFNC O2 . Abdomen:  soft, NT, ND, NABS . Skin:  no rash or induration seen on limited exam . Musculoskeletal:  grossly normal tone BUE/BLE, good ROM, no bony abnormality; B club feet . Psychiatric:  Mildly anxious/emotionally labile mood and affect, speech fluent and  appropriate, AOx3 . Neurologic:  CN 2-12 grossly intact, moves all extremities in coordinated fashion, sensation intact    Radiological Exams on Admission: CT Angio Chest PE W/Cm &/Or Wo Cm  Result Date: 04/28/2019 CLINICAL DATA:  63 year old male with COVID and shortness of breath EXAM: CT ANGIOGRAPHY CHEST WITH CONTRAST TECHNIQUE: Multidetector CT imaging of the chest was performed using the standard protocol during bolus administration of intravenous contrast. Multiplanar CT image reconstructions and MIPs were obtained to evaluate the vascular anatomy. CONTRAST:  75mL OMNIPAQUE IOHEXOL 350 MG/ML SOLN COMPARISON:  None. FINDINGS: Cardiovascular: Adequate opacification of the pulmonary arteries to the on or proximal segmental level. No evidence of central filling defect to suggest acute pulmonary embolus. Two vessel aortic arch. The right brachiocephalic and left common carotid artery share a common origin. The heart is normal in size. No pericardial effusion. Mediastinum/Nodes: Unremarkable CT appearance of the thyroid gland. No suspicious mediastinal or hilar adenopathy. No soft tissue mediastinal mass. Small hiatal hernia. Lungs/Pleura: Combined centrilobular and paraseptal emphysema visualized in the upper lungs. Areas of patchy ground-glass attenuation airspace opacities scattered throughout both lungs with additional areas of linear atelectasis in the lower lobes. No evidence of pulmonary edema, pleural effusion or pneumothorax. Upper Abdomen: Low attenuation of the hepatic parenchyma suggests underlying steatosis. Musculoskeletal: No acute fracture or aggressive appearing lytic or blastic osseous lesion. Review of the MIP images confirms the above findings. IMPRESSION: 1. No evidence for acute pulmonary embolus. 2. Scattered areas of bilateral ground-glass attenuation airspace opacity interspersed with regions of linear atelectasis. Findings are consistent with viral pneumonia in  this patient with  COVID. 3. Above findings are superimposed on a background of mild combined centrilobular and paraseptal pulmonary emphysema. 4. Small hiatal hernia. 5. Suspect hepatic steatosis. Electronically Signed   By: Malachy Moan M.D.   On: 04/28/2019 12:23   DG Chest Port 1 View  Result Date: 04/28/2019 CLINICAL DATA:  Shortness of breath and hypoxia EXAM: PORTABLE CHEST 1 VIEW COMPARISON:  03/07/2019 FINDINGS: Patchy bilateral pulmonary infiltrate. Normal heart size and mediastinal contours. No effusion or pneumothorax. Degenerative endplate spurring. IMPRESSION: Bilateral pneumonia. Electronically Signed   By: Marnee Spring M.D.   On: 04/28/2019 09:59    EKG: Independently reviewed.  Sinus tachycardia with rate 118; LAFB, nonspecific ST changes    Labs on Admission: I have personally reviewed the available labs and imaging studies at the time of the admission.  Pertinent labs:   Na++ 131 CO2 19 Glucose 265 BUN 15/Creatinine 1.28/GFR >60 Albumin 2.8 AST 60/ALT 34 BNP 34.6 LDH 349 HS troponin 12 Ferritin 650 CRP 20 Lactate 2.2 Procalcitonin 0.34 WBC 8.2 COVID POSITIVE on 12/16 A1c 7.4 on 7/30  Assessment/Plan Principal Problem:   Acute respiratory disease due to COVID-19 virus Active Problems:   Uncontrolled diabetes mellitus with complications (HCC)   HYPERTENSION   Obesity, Class II, BMI 35-39.9   Prostate hypertrophy    Acute respiratory failure with hypoxia due to PNA-associated COVID-19 infection -Patient with presenting with SOB and progressive dyspnea/cough -Mild anorexia noted without the presence of other GI symptoms -He does not have a usual home O2 requirement and is currently requiring 10L HFNC O2 -COVID POSITIVE on 12/16 -The patient has comorbidities which may increase the risk for ARDS/MODS including: age, HTN, DM, obesity, possible COPD (findings suggestive of this on CTA but no reported h/o) -Exam is concerning for development of ARDS/MODS due to  respiratory distress -Pertinent labs concerning for COVID include normal WBC count; mildly increased BUN/Creatinine; mildly increased LFTs; markedly elevated D-dimer (>1); markedly elevated CRP (>>7); increased LDH; increased ferritin; markedly increased fibrinogen -CXR with multifocal opacities which may be c/w COVID vs. Multifocal PNA -Will treat with broad-spectrum antibiotics given procalcitonin >0.1.   -Will admit to [facility] Unit for further evaluation, close monitoring, and treatment -At this time, will attempt to avoid use of aerosolized medications and use HFAs instead -Will check daily labs including BMP with Mag, Phos; LFTs; CBC with differential; CRP (q12h); ferritin; fibrinogen; D-dimer (q12h) -Will order steroids and Remdesivir (pharmacy consult) given +COVID test, +CXR, and hypoxia <94% on room air -If the patient shows clinical deterioration, consider transfer to ICU with PCCM consultation -If the patient is hypoxic or on >3L oxygen from baseline or CRP >7, considerTocilizumab 8 mg/kg x1 IF + COVID test; O2 sats <88% on room air; and patient is at high risk for intubation.  Will defer to [facility] doctors regarding this medication since it is being used off label. -Will attempt to maintain euvolemia to a net negative fluid status -Will ask the patient to maintain an awake prone position for 16+ hours a day, if possible, with a minimum of 2-3 hours at a time -With D-dimer <5, will use standard-dosed Lovenox for DVT prevention -Patient was seen wearing full PPE including: gown, gloves, head cover, N95, and face shield; donning and doffing was in compliance with current standards.  DM -Recent A1c was 7.4, indicating uncontrolled DM (11/2018) -hold Glucotrol, Jardiance, Victoza -Cover with moderate-scale SSI  HTN -Continue Norvasc and Coreg -Hold Diovan to ensure renal function stability  HLD -Continue Crestor  BPH -Continue Flomax  Stage 2 CKD -Hold ARB for now -Will  follow while hospitalized but appears to be relatively stable at this time     DVT prophylaxis:  Lovenox  Code Status:  Full - confirmed with patient Family Communication: None present; I spoke with the patient's sister by telephone. Disposition Plan:  Home once clinically improved Consults called: None  Admission status: Admit - It is my clinical opinion that admission to INPATIENT is reasonable and necessary because of the expectation that this patient will require hospital care that crosses at least 2 midnights to treat this condition based on the medical complexity of the problems presented.  Given the aforementioned information, the predictability of an adverse outcome is felt to be significant.     Jonah BlueJennifer Shailynn Fong MD [facility]   How to contact the [facility] Attending or Consulting provider 7A - 7P or covering provider during after hours 7P -7A, for this patient?  1. Check the care team in [facility] and look for a) attending/consulting [facility] provider listed and b) the [facility] team listed 2. Log into [email or web address] and use [place]'s universal password to access. If you do not have the password, please contact the hospital operator. 3. Locate the [facility] provider you are looking for under [facility] and page to a number that you can be directly reached. 4. If you still have difficulty reaching the provider, please page the [facility] (Director on Call) for the Hospitalists listed on amion for assistance.   04/28/2019, 12:56 PM

## 2019-04-28 NOTE — ED Provider Notes (Signed)
[facility] EMERGENCY DEPARTMENT Provider Note   CSN: [id number] Arrival date & time: 04/28/19  4098     History Chief Complaint  Patient presents with  . Shortness of Breath  . COVID+    Daniel Perkins is a 63 y.o. male.  The history is provided by the patient, the EMS personnel and medical records. No language interpreter was used.  Shortness of Breath  Daniel Perkins is a 63 y.o. male who presents to the Emergency Department complaining of shortness of breath. He presents in the emergency department by EMS for evaluation of one week of shortness of breath and cough. He was recently diagnosed with COVID-19 infection. He reports significant dyspnea on exertion and even with talking and coughing. He has a history of diabetes, hypertension. He denies any fevers but is noted to be febrile by EMS and received Tylenol prior to ED arrival. He denies any leg swelling or pain, abdominal pain, vomiting, diarrhea. No prior similar symptoms. He is a former smoker. No history of lung disease. Symptoms were gradual and onset. EMS reports sats in the 70s to 80s on room air on their arrival.    Past Medical History:  Diagnosis Date  . Club foot of both lower extremities 1958  . Coronary artery disease    Holter monitor 02/2012 - sinus with rare PVC  . CTEV (congenital talipes equinovarus)   . Gout   . Hyperlipidemia   . Hypertension   . Myocardial infarction [facility] [address]) ~ 2007   "mild"  . Osteomyelitis (HCC)   . Pneumonia 2000's   "couple times"  . Type II diabetes mellitus (HCC) 2011    Patient Active Problem List   Diagnosis Date Noted  . Acute respiratory disease due to COVID-19 virus 04/28/2019  . Prostate hypertrophy 12/06/2018  . Ingrown toenail 06/06/2018  . Rotator cuff tendonitis, left 02/14/2017  . Toe cyanosis 02/14/2017  . Skin lesion 03/23/2016  . Pulsatile mass of left upper extremity 06/19/2015  . Sleep apnea-like behavior 10/01/2014  . Chest  pain 09/24/2014  . Peripheral vascular disease (HCC) 02/10/2014  . Microcytic anemia 02/06/2013  . CTEV (congenital talipes equinovarus)   . Statin intolerance 06/29/2012  . Esophageal reflux 06/29/2012  . Health care maintenance 06/28/2012  . Gout 06/12/2012  . Obesity, Class II, BMI 35-39.9 02/20/2012  . DOE (dyspnea on exertion) 02/20/2012  . SOB (shortness of breath) 08/18/2010  . Uncontrolled diabetes mellitus with complications (HCC) 09/06/2009  . ERECTILE DYSFUNCTION, NON-ORGANIC 02/16/2009  . Hyperlipemia 01/14/2009  . HYPERTENSION 01/14/2009    Past Surgical History:  Procedure Laterality Date  . ANTERIOR CERVICAL DECOMP/DISCECTOMY FUSION  X 2  . CARDIAC CATHETERIZATION    . FOOT FRACTURE SURGERY Right 1990's   "pt a steel plate in"  . FOOT SURGERY Left x16   "joints collapsed; keep getting infected"  . POSTERIOR CERVICAL FUSION/FORAMINOTOMY  X 2       Family History  Problem Relation Age of Onset  . Diabetes Mother   . Coronary artery disease Mother        HAS PACEMAKER  . Heart disease Mother   . Heart attack Brother   . Lung disease Neg Hx     Social History   Tobacco Use  . Smoking status: Former Smoker    Packs/day: 1.00    Years: 32.00    Pack years: 32.00    Types: Cigarettes    Quit date: 07/06/2007    Years since quitting: 11.8  .  Smokeless tobacco: Never Used  Substance Use Topics  . Alcohol use: Yes    Alcohol/week: 0.0 standard drinks    Comment: Sometimes.  . Drug use: No    Home Medications Prior to Admission medications   Medication Sig Start Date End Date Taking? Authorizing Provider  albuterol (VENTOLIN HFA) 108 (90 Base) MCG/ACT inhaler Inhale 2 puffs into the lungs every 4 (four) hours as needed for wheezing or shortness of breath.  08/21/18  Yes [provider]  allopurinol (ZYLOPRIM) 100 MG tablet Take 1 tablet (100 mg total) by mouth daily. 03/28/19  Yes Aslam, Leanna Sato, MD  amLODipine (NORVASC) 5 MG tablet Take 1  tablet (5 mg total) by mouth daily. 03/28/19 03/27/20 Yes Aslam, Leanna Sato, MD  aspirin EC 81 MG tablet Take 1 tablet (81 mg total) by mouth daily. 03/19/15  Yes Deneise Lever, MD  carvedilol (COREG) 12.5 MG tablet Take 1 tablet (12.5 mg total) by mouth 2 (two) times daily. 07/31/18  Yes Dorrell, Cathleen Corti, MD  colchicine 0.6 MG tablet TAKE 1 TABLET BY MOUTH  DAILY AS NEEDED FOR GOUT  FLARE Patient taking differently: Take 0.6 mg by mouth daily as needed (GOUT  FLARE).  12/06/18  Yes Aslam, Leanna Sato, MD  empagliflozin (JARDIANCE) 10 MG TABS tablet Take 10 mg by mouth daily. 06/05/18  Yes Dorrell, Cathleen Corti, MD  glipiZIDE (GLUCOTROL) 10 MG tablet TAKE 1 TABLET BY MOUTH  TWICE A DAY BEFORE A MEAL Patient taking differently: Take 10 mg by mouth 2 (two) times daily before a meal.  07/31/18  Yes Dorrell, Cathleen Corti, MD  ibuprofen (ADVIL) 600 MG tablet Take 1 tablet (600 mg total) by mouth every 6 (six) hours as needed. Patient taking differently: Take 600 mg by mouth every 6 (six) hours as needed for moderate pain.  04/24/19  Yes Wieters, Hallie C, PA-C  liraglutide (VICTOZA) 18 MG/3ML SOPN Inject 1.8 mg into the skin daily as instructed Patient taking differently: Inject 1.8 mg into the skin daily.  06/05/18  Yes Dorrell, Cathleen Corti, MD  nitroGLYCERIN (NITROSTAT) 0.4 MG SL tablet DISSOLVE 1 TABLET UNDER THE TONGUE EVERY 5 MINUTES AS  NEEDED FOR CHEST PAIN , MAX 3 TABLETS IN 15 MINUTES,  CALL 911 IF NO RELIEF. Patient taking differently: Place 0.4 mg under the tongue See admin instructions. As emergency directions 06/26/18  Yes Iran Ouch, MD  rosuvastatin (CRESTOR) 20 MG tablet Take 1 tablet (20 mg total) by mouth daily. 06/26/18 04/28/19 Yes Iran Ouch, MD  tamsulosin (FLOMAX) 0.4 MG CAPS capsule Take 1 capsule (0.4 mg total) by mouth daily. 12/06/18  Yes Aslam, Leanna Sato, MD  valsartan (DIOVAN) 160 MG tablet Take 1 tablet (160 mg total) by mouth daily. 08/20/18  Yes Nyoka Cowden, MD  benzonatate  (TESSALON) 200 MG capsule Take 1 capsule (200 mg total) by mouth 3 (three) times daily as needed for up to 7 days for cough. Patient not taking: Reported on 04/28/2019 04/24/19 05/01/19  Wieters, Hallie C, PA-C  fluticasone (FLONASE) 50 MCG/ACT nasal spray Place 1-2 sprays into both nostrils daily for 7 days. Patient not taking: Reported on 04/28/2019 04/24/19 05/01/19  Wieters, Junius Creamer, PA-C  Insulin Pen Needle 32G X 4 MM MISC Use to inject victoza one time a day 11/15/17   Tyson Alias, MD    Allergies    Morphine and Pravastatin sodium  Review of Systems   Review of Systems  Respiratory: Positive for shortness of breath.   All other  systems reviewed and are negative.   Physical Exam Updated Vital Signs BP (!) 150/88   Pulse 96   Temp 99.6 F (37.6 C) (Oral)   Resp (!) 22   Ht 5\' 8"  (1.727 m)   Wt 108.9 kg   SpO2 97%   BMI 36.49 kg/m   Physical Exam Vitals and nursing note reviewed.  Constitutional:      Appearance: He is well-developed.  HENT:     Head: Normocephalic and atraumatic.  Cardiovascular:     Rate and Rhythm: Regular rhythm. Tachycardia present.  Pulmonary:     Effort: Pulmonary effort is normal.     Comments: Mild tachypnea, speaks in short sentences Abdominal:     Palpations: Abdomen is soft.     Tenderness: There is no abdominal tenderness. There is no guarding or rebound.  Musculoskeletal:        General: No swelling or tenderness.  Skin:    General: Skin is warm and dry.  Neurological:     Mental Status: He is alert and oriented to person, place, and time.  Psychiatric:        Behavior: Behavior normal.     ED Results / Procedures / Treatments   Labs (all labs ordered are listed, but only abnormal results are displayed) Labs Reviewed  LACTIC ACID, PLASMA - Abnormal; Notable for the following components:      Result Value   Lactic Acid, Venous 2.2 (*)    All other components within normal limits  CBC WITH DIFFERENTIAL/PLATELET  - Abnormal; Notable for the following components:   RBC 6.24 (*)    MCV 67.3 (*)    MCH 21.6 (*)    Monocytes Absolute 1.1 (*)    All other components within normal limits  COMPREHENSIVE METABOLIC PANEL - Abnormal; Notable for the following components:   Sodium 131 (*)    CO2 19 (*)    Glucose, Bld 265 (*)    Creatinine, Ser 1.28 (*)    Calcium 8.4 (*)    Total Protein 8.4 (*)    Albumin 2.8 (*)    AST 60 (*)    GFR calc non Af Amer 60 (*)    All other components within normal limits  D-DIMER, QUANTITATIVE (NOT AT [facility]) - Abnormal; Notable for the following components:   D-Dimer, Quant 2.19 (*)    All other components within normal limits  LACTATE DEHYDROGENASE - Abnormal; Notable for the following components:   LDH 349 (*)    All other components within normal limits  FERRITIN - Abnormal; Notable for the following components:   Ferritin 650 (*)    All other components within normal limits  FIBRINOGEN - Abnormal; Notable for the following components:   Fibrinogen >800 (*)    All other components within normal limits  C-REACTIVE PROTEIN - Abnormal; Notable for the following components:   CRP 20.0 (*)    All other components within normal limits  CULTURE, BLOOD (ROUTINE X 2)  CULTURE, BLOOD (ROUTINE X 2)  PROCALCITONIN  BRAIN NATRIURETIC PEPTIDE  TRIGLYCERIDES  HIV ANTIBODY (ROUTINE TESTING W REFLEX)  C-REACTIVE PROTEIN  D-DIMER, QUANTITATIVE (NOT AT [facility])  TROPONIN I (HIGH SENSITIVITY)    EKG EKG Interpretation  Date/Time:  Sunday April 28 2019 09:17:02 EST Ventricular Rate:  118 PR Interval:    QRS Duration: 92 QT Interval:  321 QTC Calculation: 450 R Axis:   -62 Text Interpretation: Sinus tachycardia Probable left atrial enlargement Left anterior fascicular block Abnormal R-wave progression,  late transition Confirmed by Tilden Fossa [phone]) on 04/28/2019 9:25:43 AM   Radiology CT Angio Chest PE W/Cm &/Or Wo Cm  Result Date: 04/28/2019 CLINICAL DATA:   63 year old male with COVID and shortness of breath EXAM: CT ANGIOGRAPHY CHEST WITH CONTRAST TECHNIQUE: Multidetector CT imaging of the chest was performed using the standard protocol during bolus administration of intravenous contrast. Multiplanar CT image reconstructions and MIPs were obtained to evaluate the vascular anatomy. CONTRAST:  75mL OMNIPAQUE IOHEXOL 350 MG/ML SOLN COMPARISON:  None. FINDINGS: Cardiovascular: Adequate opacification of the pulmonary arteries to the on or proximal segmental level. No evidence of central filling defect to suggest acute pulmonary embolus. Two vessel aortic arch. The right brachiocephalic and left common carotid artery share a common origin. The heart is normal in size. No pericardial effusion. Mediastinum/Nodes: Unremarkable CT appearance of the thyroid gland. No suspicious mediastinal or hilar adenopathy. No soft tissue mediastinal mass. Small hiatal hernia. Lungs/Pleura: Combined centrilobular and paraseptal emphysema visualized in the upper lungs. Areas of patchy ground-glass attenuation airspace opacities scattered throughout both lungs with additional areas of linear atelectasis in the lower lobes. No evidence of pulmonary edema, pleural effusion or pneumothorax. Upper Abdomen: Low attenuation of the hepatic parenchyma suggests underlying steatosis. Musculoskeletal: No acute fracture or aggressive appearing lytic or blastic osseous lesion. Review of the MIP images confirms the above findings. IMPRESSION: 1. No evidence for acute pulmonary embolus. 2. Scattered areas of bilateral ground-glass attenuation airspace opacity interspersed with regions of linear atelectasis. Findings are consistent with viral pneumonia in this patient with COVID. 3. Above findings are superimposed on a background of mild combined centrilobular and paraseptal pulmonary emphysema. 4. Small hiatal hernia. 5. Suspect hepatic steatosis. Electronically Signed   By: Malachy Moan M.D.   On:  04/28/2019 12:23   DG Chest Port 1 View  Result Date: 04/28/2019 CLINICAL DATA:  Shortness of breath and hypoxia EXAM: PORTABLE CHEST 1 VIEW COMPARISON:  03/07/2019 FINDINGS: Patchy bilateral pulmonary infiltrate. Normal heart size and mediastinal contours. No effusion or pneumothorax. Degenerative endplate spurring. IMPRESSION: Bilateral pneumonia. Electronically Signed   By: Marnee Spring M.D.   On: 04/28/2019 09:59    Procedures Procedures (including critical care time) CRITICAL CARE Performed by: Tilden Fossa   Total critical care time: 45 minutes  Critical care time was exclusive of separately billable procedures and treating other patients.  Critical care was necessary to treat or prevent imminent or life-threatening deterioration.  Critical care was time spent personally by me on the following activities: development of treatment plan with patient and/or surrogate as well as nursing, discussions with consultants, evaluation of patient's response to treatment, examination of patient, obtaining history from patient or surrogate, ordering and performing treatments and interventions, ordering and review of laboratory studies, ordering and review of radiographic studies, pulse oximetry and re-evaluation of patient's condition.  Medications Ordered in ED Medications  enoxaparin (LOVENOX) injection 40 mg (has no administration in time range)  sodium chloride flush (NS) 0.9 % injection 3 mL (has no administration in time range)  sodium chloride flush (NS) 0.9 % injection 3 mL (has no administration in time range)  0.9 %  sodium chloride infusion (has no administration in time range)  remdesivir 200 mg in sodium chloride 0.9% 250 mL IVPB (has no administration in time range)    Followed by  remdesivir 100 mg in sodium chloride 0.9 % 100 mL IVPB (has no administration in time range)  acetaminophen (TYLENOL) tablet 650 mg (has no administration in time  range)  oxyCODONE (Oxy  IR/ROXICODONE) immediate release tablet 5 mg (has no administration in time range)  polyethylene glycol (MIRALAX / GLYCOLAX) packet 17 g (has no administration in time range)  bisacodyl (DULCOLAX) EC tablet 5 mg (has no administration in time range)  sodium phosphate (FLEET) 7-19 GM/118ML enema 1 enema (has no administration in time range)  ondansetron (ZOFRAN) tablet 4 mg (has no administration in time range)    Or  ondansetron (ZOFRAN) injection 4 mg (has no administration in time range)  insulin aspart (novoLOG) injection 0-15 Units (has no administration in time range)  sodium chloride flush (NS) 0.9 % injection 3 mL (has no administration in time range)  insulin aspart (novoLOG) injection 0-5 Units (has no administration in time range)  cefTRIAXone (ROCEPHIN) 2 g in sodium chloride 0.9 % 100 mL IVPB (has no administration in time range)  azithromycin (ZITHROMAX) 500 mg in sodium chloride 0.9 % 250 mL IVPB (has no administration in time range)  dexamethasone (DECADRON) injection 6 mg (has no administration in time range)  dexamethasone (DECADRON) injection 6 mg (6 mg Intravenous Given 04/28/19 1017)  iohexol (OMNIPAQUE) 350 MG/ML injection 75 mL (75 mLs Intravenous Contrast Given 04/28/19 1206)    ED Course  I have reviewed the triage vital signs and the nursing notes.  Pertinent labs & imaging results that were available during my care of the patient were reviewed by me and considered in my medical decision making (see chart for details).    MDM Rules/Calculators/A&P                      Patient here for evaluation of shortness of breath, recent COVID 19 diagnosis. He has significant oxygen requirement on ED arrival. He was transitioned from non-rebreather to high flow nasal cannula. Chest x-ray with bilateral infiltrates consistent with COVID-19 pneumonia. Presentation is not consistent with acute bacterial process. He was treated with Decadron for hypoxia secondary to COVID-19  infection. CTA was obtained, which was negative for PE. Hospitalist consulted for admission for further management. Patient updated findings of studies and recommendation for admission and he is in agreement with treatment plan. Final Clinical Impression(s) / ED Diagnoses Final diagnoses:  Pneumonia due to COVID-19 virus  Acute respiratory failure with hypoxia [facility])    Rx / DC Orders ED Discharge Orders    None       Quintella Reichert, MD 04/28/19 1514

## 2019-04-28 NOTE — ED Notes (Signed)
Ruby([facility]Transfer to [facility]) called @ 1300.  

## 2019-04-29 LAB — CBC WITH DIFFERENTIAL/PLATELET
Abs Immature Granulocytes: 0.09 10*3/uL — ABNORMAL HIGH (ref 0.00–0.07)
Basophils Absolute: 0 10*3/uL (ref 0.0–0.1)
Basophils Relative: 0 %
Eosinophils Absolute: 0 10*3/uL (ref 0.0–0.5)
Eosinophils Relative: 0 %
HCT: 39.9 % (ref 39.0–52.0)
Hemoglobin: 12.6 g/dL — ABNORMAL LOW (ref 13.0–17.0)
Immature Granulocytes: 1 %
Lymphocytes Relative: 12 %
Lymphs Abs: 1 10*3/uL (ref 0.7–4.0)
MCH: 21.7 pg — ABNORMAL LOW (ref 26.0–34.0)
MCHC: 31.6 g/dL (ref 30.0–36.0)
MCV: 68.7 fL — ABNORMAL LOW (ref 80.0–100.0)
Monocytes Absolute: 1.3 10*3/uL — ABNORMAL HIGH (ref 0.1–1.0)
Monocytes Relative: 15 %
Neutro Abs: 6.3 10*3/uL (ref 1.7–7.7)
Neutrophils Relative %: 72 %
Platelets: 266 10*3/uL (ref 150–400)
RBC: 5.81 MIL/uL (ref 4.22–5.81)
RDW: 15 % (ref 11.5–15.5)
WBC: 8.7 10*3/uL (ref 4.0–10.5)
nRBC: 0 % (ref 0.0–0.2)

## 2019-04-29 LAB — NOVEL CORONAVIRUS, NAA (HOSP ORDER, SEND-OUT TO REF LAB; TAT 18-24 HRS): SARS-CoV-2, NAA: DETECTED — AB

## 2019-04-29 LAB — FERRITIN: Ferritin: 699 ng/mL — ABNORMAL HIGH (ref 24–336)

## 2019-04-29 LAB — COMPREHENSIVE METABOLIC PANEL
ALT: 33 U/L (ref 0–44)
AST: 50 U/L — ABNORMAL HIGH (ref 15–41)
Albumin: 2.7 g/dL — ABNORMAL LOW (ref 3.5–5.0)
Alkaline Phosphatase: 58 U/L (ref 38–126)
Anion gap: 12 (ref 5–15)
BUN: 27 mg/dL — ABNORMAL HIGH (ref 8–23)
CO2: 21 mmol/L — ABNORMAL LOW (ref 22–32)
Calcium: 8.5 mg/dL — ABNORMAL LOW (ref 8.9–10.3)
Chloride: 100 mmol/L (ref 98–111)
Creatinine, Ser: 1.28 mg/dL — ABNORMAL HIGH (ref 0.61–1.24)
GFR calc Af Amer: >60 mL/min (ref >60)
GFR calc non Af Amer: 60 mL/min — ABNORMAL LOW (ref >60)
Glucose, Bld: 185 mg/dL — ABNORMAL HIGH (ref 70–99)
Potassium: 4.6 mmol/L (ref 3.5–5.1)
Sodium: 133 mmol/L — ABNORMAL LOW (ref 135–145)
Total Bilirubin: 0.2 mg/dL — ABNORMAL LOW (ref 0.3–1.2)
Total Protein: 8.3 g/dL — ABNORMAL HIGH (ref 6.5–8.1)

## 2019-04-29 LAB — PHOSPHORUS: Phosphorus: 2.5 mg/dL (ref 2.5–4.6)

## 2019-04-29 LAB — GLUCOSE, CAPILLARY
Glucose-Capillary: 198 mg/dL — ABNORMAL HIGH (ref 70–99)
Glucose-Capillary: 223 mg/dL — ABNORMAL HIGH (ref 70–99)
Glucose-Capillary: 218 mg/dL — ABNORMAL HIGH (ref 70–99)
Glucose-Capillary: 243 mg/dL — ABNORMAL HIGH (ref 70–99)

## 2019-04-29 LAB — HEMOGLOBIN A1C
Hgb A1c MFr Bld: 9.2 % — ABNORMAL HIGH (ref 4.8–5.6)
Mean Plasma Glucose: 217.34 mg/dL

## 2019-04-29 LAB — D-DIMER, QUANTITATIVE: D-Dimer, Quant: 2.6 ug/mL-FEU — ABNORMAL HIGH (ref 0.00–0.50)

## 2019-04-29 LAB — MAGNESIUM: Magnesium: 2.4 mg/dL (ref 1.7–2.4)

## 2019-04-29 LAB — C-REACTIVE PROTEIN: CRP: 21 mg/dL — ABNORMAL HIGH (ref <1.0)

## 2019-04-29 MED ORDER — ENOXAPARIN SODIUM 60 MG/0.6ML ~~LOC~~ SOLN
55.0000 mg | Freq: Every day | SUBCUTANEOUS | Status: DC
  Administered 2019-04-29 – 2019-05-15 (×17): 55 mg via SUBCUTANEOUS
  Filled 2019-04-29 (×18): qty 0.6

## 2019-04-29 MED ORDER — FUROSEMIDE 10 MG/ML IJ SOLN
40.0000 mg | Freq: Once | INTRAMUSCULAR | Status: AC
  Administered 2019-04-29: 40 mg via INTRAVENOUS
  Filled 2019-04-29: qty 4

## 2019-04-29 MED ORDER — ADULT MULTIVITAMIN W/MINERALS CH
1.0000 | ORAL_TABLET | Freq: Every day | ORAL | Status: DC
  Administered 2019-04-29 – 2019-05-15 (×17): 1 via ORAL
  Filled 2019-04-29 (×17): qty 1

## 2019-04-29 MED ORDER — ENSURE ENLIVE PO LIQD
237.0000 mL | Freq: Two times a day (BID) | ORAL | Status: DC
  Administered 2019-04-30 – 2019-05-04 (×10): 237 mL via ORAL

## 2019-04-29 NOTE — Plan of Care (Signed)
  Problem: Education: Goal: Knowledge of risk factors and measures for prevention of condition will improve Outcome: Progressing   Problem: Coping: Goal: Psychosocial and spiritual needs will be supported Outcome: Progressing   Problem: Respiratory: Goal: Will maintain a patent airway Outcome: Progressing Goal: Complications related to the disease process, condition or treatment will be avoided or minimized Outcome: Progressing   

## 2019-04-29 NOTE — Progress Notes (Signed)
Initial Nutrition Assessment  RD working remotely.   DOCUMENTATION CODES:   Obesity unspecified  INTERVENTION:  - will order Ensure Enlive BID, each supplement provides 350 kcal and 20 grams of protein. - continue Magic Cup BID with meals per protocol, each supplement provides 290 kcal and 9 grams of protein. - will order daily multivitamin with minerals. - continue to encourage PO intakes.    NUTRITION DIAGNOSIS:   Increased nutrient needs related to acute illness as evidenced by estimated needs.  GOAL:   Patient will meet greater than or equal to 90% of their needs  MONITOR:   PO intake, Supplement acceptance, Labs, Weight trends  REASON FOR ASSESSMENT:   Malnutrition Screening Tool  ASSESSMENT:   63 year-old with a history of DM, CAD, HTN, HLD, and bilateral clubfoot. He initially presented to Urgent Care on 12/16 with fatigue, anorexia, and loss of taste and was ultimately found to be COVID positive. Since that time, he has developed increasing SOB with refractory cough. He presented to the [facility] ED 12/20 where CXR noted bilateral pulmonary infiltrates and he required 10L HFNC to keep his saturations in the mid 90s.  Per flow sheet documentation, patient consumed 50% of breakfast and 25% of lunch today. Current weight is 240 lb and weight has been stable since March 2019; slightly up over the past 1 month. He denies any difficulty chewing or swallowing, no abdominal pain/pressure or nausea, but simply having decreased/lack of appetite d/t feeling unwell and having significant SOB.   Per notes: - [id number] PNA--plan for decadron and remdesivir and to consider actemra if he declines - uncontrolled type 2 DM - hyponatremia--improving - hepatic steatosis--will need outpatient follow-up   Labs reviewed; CBGs: 198 and 223 mg/dl, Na: 133 mmol/l, BUN: 27 mg/dl, creatinine: 1.28 mg/dl, Ca: 8.5 mg/dl. Medications reviewed; 40 mg IV lasix x1 dose 12/21, sliding scale novolog, 18  units lantus/day, 5 mg tradjenta/day, 200 mg remdesivir x1 dose 12/20, 100 mg remdesivir x1 dose/day x4 days.    NUTRITION - FOCUSED PHYSICAL EXAM:  unable to complete for COVID+ patient.   Diet Order:   Diet Order            Diet Carb Modified Fluid consistency: Thin; Room service appropriate? Yes  Diet effective now              EDUCATION NEEDS:   No education needs have been identified at this time  Skin:  Skin Assessment: Reviewed RN Assessment  Last BM:  12/19  Height:   Ht Readings from Last 1 Encounters:  04/28/19 5\' 8"  (1.727 m)    Weight:   Wt Readings from Last 1 Encounters:  04/28/19 108.9 kg    Ideal Body Weight:  70 kg  BMI:  Body mass index is 36.49 kg/m.  Estimated Nutritional Needs:   Kcal:  2000-2200 kcal  Protein:  100-120 grams  Fluid:  >/= 2.2 L/day     Jarome Matin, MS, RD, LDN, [facility] Inpatient Clinical Dietitian Pager # [phone] After hours/weekend pager # [phone]

## 2019-04-29 NOTE — Progress Notes (Signed)
Daniel Perkins  ZOX:[id number] DOB: 12/23/1955 DOA: 04/28/2019 PCP: Eliezer BottomAslam, Sadia, MD    Brief Narrative:  63yo with a history of DM, CAD, HTN, HLD, and bilateral clubfoot who originally presented to an urgent care center 12/16 with fatigue, anorexia, and loss of taste and was ultimately found to be Covid positive. Since that time he has developed increasing shortness of breath with refractory cough. [facility] 12/20 where a CXR noted bilateral pulmonary infiltrates and he required 10 L high flow nasal cannula oxygen to keep his saturations in the mid 90s.  Significant Events: 12/16 Covid [facility] at [facility] 12/20 admit via [place] - transfer to [facility]  COVID-19 specific Treatment: Remdesivir 12/20 > Decadron 12/20 >  Antimicrobials:  Azithromycin 12/20 > Ceftriaxone 12/20 >  Subjective: Reports that he is feeling much better today, and less SOB. Denies cp, n/v, or abdom pain. Is quite concerned about his elevated CBGs.   Assessment & Plan:  COVID Pneumonia -acute hypoxic respiratory failure Cont decadron and remdesivir - consider actemra if declines clinically - in no distress at time of exam today   Recent Labs  Lab 04/28/19 0939 04/28/19 1602 04/29/19 0234  DDIMER 2.19* 2.11* 2.60*  FERRITIN 650*  --   --   CRP 20.0* 21.0* 21.0*  ALT 34  --  33  PROCALCITON 0.34  --   --     DM 2 uncontrolled with hyperglycemia CBG climbing on steroid tx - adjust tx and follow  HTN BP well controlled - monitor   CKD stage II crt stable for now - cont to follow   Hyponatremia Na improving - follow trend   Hepatic steatosis Noted on CT chest - will need outpt f/u   HLD Cont home med tx   BPH Cont home med tx   Obesity - Estimated body mass index is 36.49 kg/m as calculated from the following:   Height as of this encounter: 5\' 8"  (1.727 m).   Weight as of this encounter: 108.9 kg.   DVT prophylaxis: Lovenox  Code Status: FULL  CODE Family Communication:  Disposition Plan:   Consultants:  none   Objective: Blood pressure 114/70, pulse 85, temperature 98.7 F (37.1 C), temperature source Oral, resp. rate (!) 32, height 5\' 8"  (1.727 m), weight 108.9 kg, SpO2 97 %.  Intake/Output Summary (Last 24 hours) at 04/29/2019 [id number] Last data filed at 04/28/2019 2000 Gross per 24 hour  Intake 1558.71 ml  Output 325 ml  Net 1233.71 ml   Filed Weights   04/28/19 0919  Weight: 108.9 kg    Examination: General: No acute respiratory distress Lungs: fine crackles diffusely - no wheezing  Cardiovascular: Regular rate and rhythm without murmur gallop or rub normal S1 and S2 Abdomen: Nontender, nondistended, soft, bowel sounds positive, no rebound, no ascites, no appreciable mass Extremities: No significant cyanosis, clubbing, or edema bilateral lower extremities   CBC: Recent Labs  Lab 04/28/19 0939 04/29/19 0234  WBC 8.2 8.7  NEUTROABS 6.0 6.3  HGB 13.5 12.6*  HCT 42.0 39.9  MCV 67.3* 68.7*  PLT 295 266   Basic Metabolic Panel: Recent Labs  Lab 04/28/19 0939 04/29/19 0234  NA 131* 133*  K 4.0 4.6  CL 100 100  CO2 19* 21*  GLUCOSE 265* 185*  BUN 15 27*  CREATININE 1.28* 1.28*  CALCIUM 8.4* 8.5*  MG  --  2.4  PHOS  --  2.5   GFR: Estimated Creatinine Clearance:  71.6 mL/min (A) (by C-G formula based on SCr of 1.28 mg/dL (H)).  Liver Function Tests: Recent Labs  Lab 04/28/19 0939 04/29/19 0234  AST 60* 50*  ALT 34 33  ALKPHOS 66 58  BILITOT 0.5 0.2*  PROT 8.4* 8.3*  ALBUMIN 2.8* 2.7*    HbA1C: Hemoglobin A1C  Date/Time Value Ref Range Status  12/06/2018 05:03 PM 7.4 (A) 4.0 - 5.6 % Final  06/05/2018 04:44 PM 9.7 (A) 4.0 - 5.6 % Final   Hgb A1c MFr Bld  Date/Time Value Ref Range Status  03/22/2016 04:13 PM 10.4 (H) 4.8 - 5.6 % Final    Comment:             Pre-diabetes: 5.7 - 6.4          Diabetes: >6.4          Glycemic control for adults with diabetes: <7.0   10/28/2010  12:30 AM 7.4 (H) <5.7 % Final    Comment:    (NOTE)                                                                       According to the ADA Clinical Practice Recommendations for 2011, when HbA1c is used as a screening test:  >=6.5%   Diagnostic of Diabetes Mellitus           (if abnormal result is confirmed) 5.7-6.4%   Increased risk of developing Diabetes Mellitus References:Diagnosis and Classification of Diabetes Mellitus,Diabetes Care,2011,34(Suppl 1):S62-S69 and Standards of Medical Care in         Diabetes - 2011,Diabetes Care,2011,34 (Suppl 1):S11-S61.    CBG: Recent Labs  Lab 04/28/19 1612 04/28/19 1751 04/28/19 2241 04/29/19 0743  GLUCAP 212* 300* 290* 198*    Recent Results (from the past 240 hour(s))  Novel Coronavirus, NAA (Hosp order, Send-out to Ref Lab; TAT 18-24 hrs     Status: Abnormal   Collection Time: 04/24/19  8:54 PM   Specimen: Nasopharyngeal Swab; Respiratory  Result Value Ref Range Status   SARS-CoV-2, NAA DETECTED (A) NOT DETECTED Final    Comment: (NOTE)                  Client Requested Flag This nucleic acid amplification test was developed and its performance characteristics determined by [employer]. Nucleic acid amplification tests include PCR and TMA. This test has not been FDA cleared or approved. This test has been authorized by FDA under an Emergency Use Authorization (EUA). This test is only authorized for the duration of time the declaration that circumstances exist justifying the authorization of the emergency use of in vitro diagnostic tests for detection of SARS-CoV-2 virus and/or diagnosis of COVID-19 infection under section 564(b)(1) of the Act, 21 U.S.C. 440NUU-7(O) (1), unless the authorization is terminated or revoked sooner. When diagnostic testing is negative, the possibility of a false negative result should be considered in the context of a patient's recent exposures and the presence of clinical signs and  symptoms consistent with COVID-19. An individual without symptoms of COVID-  19 and who is not shedding SARS-CoV-2 virus would expect to have a negative (not detected) result in this assay. Performed At: [facility] [address] [place], [place] [id number] Jolene Schimke MD  FB:[id number]    Coronavirus Source NASOPHARYNGEAL  Final    Comment: Performed at [facility], [place] [address], [place], [place] [address]  Blood Culture (routine x 2)     Status: None (Preliminary result)   Collection Time: 04/28/19  9:40 AM   Specimen: BLOOD  Result Value Ref Range Status   Specimen Description BLOOD RIGHT ANTECUBITAL  Final   Special Requests   Final    BOTTLES DRAWN AEROBIC AND ANAEROBIC Blood Culture results may not be optimal due to an excessive volume of blood received in culture bottles   Culture   Final    NO GROWTH < 24 HOURS Performed at [facility], [place] [address], [place], [place] [address]    Report Status PENDING  Incomplete  Blood Culture (routine x 2)     Status: None (Preliminary result)   Collection Time: 04/28/19  3:54 PM   Specimen: BLOOD  Result Value Ref Range Status   Specimen Description BLOOD LEFT ANTECUBITAL  Final   Special Requests   Final    BOTTLES DRAWN AEROBIC AND ANAEROBIC Blood Culture results may not be optimal due to an inadequate volume of blood received in culture bottles   Culture   Final    NO GROWTH < 24 HOURS Performed at [facility], [place] [address] [place], [place] [address]    Report Status PENDING  Incomplete     Scheduled Meds: . allopurinol  100 mg Oral Daily  . amLODipine  5 mg Oral Daily  . aspirin EC  81 mg Oral Daily  . carvedilol  12.5 mg Oral BID  . dexamethasone (DECADRON) injection  6 mg Intravenous Q24H  . enoxaparin (LOVENOX) injection  40 mg Subcutaneous Q24H  . insulin aspart  0-20 Units Subcutaneous TID WC  . insulin aspart  0-5 Units Subcutaneous QHS  . insulin glargine  18 Units Subcutaneous  QHS  . linagliptin  5 mg Oral Daily  . rosuvastatin  20 mg Oral Daily  . sodium chloride flush  3 mL Intravenous Q12H  . tamsulosin  0.4 mg Oral Daily   Continuous Infusions: . sodium chloride    . azithromycin 500 mg (04/28/19 1625)  . cefTRIAXone (ROCEPHIN)  IV Stopped (04/28/19 1643)  . remdesivir 100 mg in NS 100 mL 100 mg (04/29/19 0915)     LOS: 1 day   Cherene Altes, MD [facility] Office  [phone] Pager - Text Page per Amion  If 7PM-7AM, please contact night-coverage per Amion 04/29/2019, 9:27 AM

## 2019-04-29 NOTE — Progress Notes (Signed)
RN spoke with sister and friend Kennyth Lose on the phone. Updates given. No further concerns

## 2019-04-30 LAB — COMPREHENSIVE METABOLIC PANEL
ALT: 30 U/L (ref 0–44)
AST: 42 U/L — ABNORMAL HIGH (ref 15–41)
Albumin: 2.7 g/dL — ABNORMAL LOW (ref 3.5–5.0)
Alkaline Phosphatase: 58 U/L (ref 38–126)
Anion gap: 12 (ref 5–15)
BUN: 41 mg/dL — ABNORMAL HIGH (ref 8–23)
CO2: 21 mmol/L — ABNORMAL LOW (ref 22–32)
Calcium: 8.8 mg/dL — ABNORMAL LOW (ref 8.9–10.3)
Chloride: 104 mmol/L (ref 98–111)
Creatinine, Ser: 1.23 mg/dL (ref 0.61–1.24)
GFR calc Af Amer: >60 mL/min (ref >60)
GFR calc non Af Amer: >60 mL/min (ref >60)
Glucose, Bld: 164 mg/dL — ABNORMAL HIGH (ref 70–99)
Potassium: 4.3 mmol/L (ref 3.5–5.1)
Sodium: 137 mmol/L (ref 135–145)
Total Bilirubin: 0.3 mg/dL (ref 0.3–1.2)
Total Protein: 8.2 g/dL — ABNORMAL HIGH (ref 6.5–8.1)

## 2019-04-30 LAB — CBC WITH DIFFERENTIAL/PLATELET
Abs Immature Granulocytes: 0.11 10*3/uL — ABNORMAL HIGH (ref 0.00–0.07)
Basophils Absolute: 0 10*3/uL (ref 0.0–0.1)
Basophils Relative: 0 %
Eosinophils Absolute: 0 10*3/uL (ref 0.0–0.5)
Eosinophils Relative: 0 %
HCT: 40.6 % (ref 39.0–52.0)
Hemoglobin: 12.7 g/dL — ABNORMAL LOW (ref 13.0–17.0)
Immature Granulocytes: 1 %
Lymphocytes Relative: 7 %
Lymphs Abs: 0.9 10*3/uL (ref 0.7–4.0)
MCH: 21.3 pg — ABNORMAL LOW (ref 26.0–34.0)
MCHC: 31.3 g/dL (ref 30.0–36.0)
MCV: 68 fL — ABNORMAL LOW (ref 80.0–100.0)
Monocytes Absolute: 1.1 10*3/uL — ABNORMAL HIGH (ref 0.1–1.0)
Monocytes Relative: 8 %
Neutro Abs: 11 10*3/uL — ABNORMAL HIGH (ref 1.7–7.7)
Neutrophils Relative %: 84 %
Platelets: 321 10*3/uL (ref 150–400)
RBC: 5.97 MIL/uL — ABNORMAL HIGH (ref 4.22–5.81)
RDW: 14.7 % (ref 11.5–15.5)
WBC: 13.2 10*3/uL — ABNORMAL HIGH (ref 4.0–10.5)
nRBC: 0 % (ref 0.0–0.2)

## 2019-04-30 LAB — GLUCOSE, CAPILLARY
Glucose-Capillary: 163 mg/dL — ABNORMAL HIGH (ref 70–99)
Glucose-Capillary: 291 mg/dL — ABNORMAL HIGH (ref 70–99)
Glucose-Capillary: 342 mg/dL — ABNORMAL HIGH (ref 70–99)
Glucose-Capillary: 374 mg/dL — ABNORMAL HIGH (ref 70–99)

## 2019-04-30 LAB — PHOSPHORUS: Phosphorus: 4.2 mg/dL (ref 2.5–4.6)

## 2019-04-30 LAB — C-REACTIVE PROTEIN: CRP: 12.2 mg/dL — ABNORMAL HIGH (ref <1.0)

## 2019-04-30 LAB — FERRITIN: Ferritin: 823 ng/mL — ABNORMAL HIGH (ref 24–336)

## 2019-04-30 LAB — MAGNESIUM: Magnesium: 2.6 mg/dL — ABNORMAL HIGH (ref 1.7–2.4)

## 2019-04-30 LAB — D-DIMER, QUANTITATIVE: D-Dimer, Quant: 2.25 ug/mL-FEU — ABNORMAL HIGH (ref 0.00–0.50)

## 2019-04-30 MED ORDER — INSULIN GLARGINE 100 UNIT/ML ~~LOC~~ SOLN
14.0000 [IU] | Freq: Two times a day (BID) | SUBCUTANEOUS | Status: DC
  Administered 2019-04-30 – 2019-05-01 (×4): 14 [IU] via SUBCUTANEOUS
  Filled 2019-04-30 (×5): qty 0.14

## 2019-04-30 MED ORDER — FUROSEMIDE 10 MG/ML IJ SOLN
40.0000 mg | Freq: Once | INTRAMUSCULAR | Status: AC
  Administered 2019-04-30: 40 mg via INTRAVENOUS
  Filled 2019-04-30: qty 4

## 2019-04-30 NOTE — Progress Notes (Signed)
Daniel Perkins  UJW:[id number] DOB: 1956/02/09 DOA: 04/28/2019 PCP: Eliezer Bottom, MD    Brief Narrative:  63yo with a history of DM, CAD, HTN, HLD, and bilateral clubfoot who originally presented to an urgent care center 12/16 with fatigue, anorexia, and loss of taste and was ultimately found to be Covid positive. Since that time he has developed increasing shortness of breath with refractory cough. He presented to the [facility] ED 12/20 where a CXR noted bilateral pulmonary infiltrates and he required 10 L high flow nasal cannula oxygen to keep his saturations in the mid 90s.  Significant Events: 12/16 Covid positive at [facility] 12/20 admit via [place] - transfer to [facility]  COVID-19 specific Treatment: Remdesivir 12/20 > Decadron 12/20 >  Antimicrobials:  Azithromycin 12/20 > Ceftriaxone 12/20 >  Subjective: His oxygen requirement has been steadily increasing. Pt is now on HFNC at 7L, but with favorable sats at 95%.  At the time of my exam the patient looks great.  He is sitting up in a bedside chair.  He is in absolutely no respiratory distress whatsoever and tells me he feels better.  He denies chest pain nausea vomiting or abdominal pain.  Assessment & Plan:  COVID Pneumonia -acute hypoxic respiratory failure Cont decadron and remdesivir - consider actemra if declines clinically but presently on exam he appears to be improving  Recent Labs  Lab 04/28/19 0939 04/28/19 1602 04/29/19 0234 04/30/19 0415  DDIMER 2.19* 2.11* 2.60* 2.25*  FERRITIN 650*  --  699* 823*  CRP 20.0* 21.0* 21.0* 12.2*  ALT 34  --  33 30  PROCALCITON 0.34  --   --   --     DM 2 uncontrolled with hyperglycemia CBG climbing on steroid tx - adjust lantus and follow closely   HTN BP controlled - monitor   CKD stage II crt stable for now - cont to follow   Hyponatremia Na improving - follow trend   Hepatic steatosis Noted on CT chest - will need outpt f/u   HLD Cont home med tx    BPH Cont home med tx   Obesity - Estimated body mass index is 36.49 kg/m as calculated from the following:   Height as of this encounter: 5\' 8"  (1.727 m).   Weight as of this encounter: 108.9 kg.   DVT prophylaxis: Lovenox  Code Status: FULL CODE Family Communication:  Disposition Plan: progressive care bed   Consultants:  none  Objective: Blood pressure 132/69, pulse 78, temperature 97.9 F (36.6 C), temperature source Oral, resp. rate 20, height 5\' 8"  (1.727 m), weight 108.9 kg, SpO2 96 %.  Intake/Output Summary (Last 24 hours) at 04/30/2019 1522 Last data filed at 04/30/2019 0500 Gross per 24 hour  Intake 550.82 ml  Output 850 ml  Net -299.18 ml   Filed Weights   04/28/19 0919  Weight: 108.9 kg    Examination: General: No acute respiratory distress - alert and conversant  Lungs: fine crackles diffusely w/o change - no wheeze  Cardiovascular: RRR - no M or rub  Abdomen: NT/ND, soft, bs+, no mass  Extremities: trace B LE edema - no cyanosis    CBC: Recent Labs  Lab 04/28/19 0939 04/29/19 0234 04/30/19 0415  WBC 8.2 8.7 13.2*  NEUTROABS 6.0 6.3 11.0*  HGB 13.5 12.6* 12.7*  HCT 42.0 39.9 40.6  MCV 67.3* 68.7* 68.0*  PLT 295 266 321   Basic Metabolic Panel: Recent Labs  Lab 04/28/19 0939 04/29/19 0234 04/30/19  0415  NA 131* 133* 137  K 4.0 4.6 4.3  CL 100 100 104  CO2 19* 21* 21*  GLUCOSE 265* 185* 164*  BUN 15 27* 41*  CREATININE 1.28* 1.28* 1.23  CALCIUM 8.4* 8.5* 8.8*  MG  --  2.4 2.6*  PHOS  --  2.5 4.2   GFR: Estimated Creatinine Clearance: 74.5 mL/min (by C-G formula based on SCr of 1.23 mg/dL).  Liver Function Tests: Recent Labs  Lab 04/28/19 0939 04/29/19 0234 04/30/19 0415  AST 60* 50* 42*  ALT 34 33 30  ALKPHOS 66 58 58  BILITOT 0.5 0.2* 0.3  PROT 8.4* 8.3* 8.2*  ALBUMIN 2.8* 2.7* 2.7*    HbA1C: Hemoglobin A1C  Date/Time Value Ref Range Status  12/06/2018 05:03 PM 7.4 (A) 4.0 - 5.6 % Final  06/05/2018 04:44 PM 9.7  (A) 4.0 - 5.6 % Final   Hgb A1c MFr Bld  Date/Time Value Ref Range Status  04/29/2019 02:34 AM 9.2 (H) 4.8 - 5.6 % Final    Comment:    (NOTE) Pre diabetes:          5.7%-6.4% Diabetes:              >6.4% Glycemic control for   <7.0% adults with diabetes   03/22/2016 04:13 PM 10.4 (H) 4.8 - 5.6 % Final    Comment:             Pre-diabetes: 5.7 - 6.4          Diabetes: >6.4          Glycemic control for adults with diabetes: <7.0     CBG: Recent Labs  Lab 04/29/19 1146 04/29/19 1633 04/29/19 2014 04/30/19 0726 04/30/19 1216  GLUCAP 223* 218* 243* 163* 291*    Recent Results (from the past 240 hour(s))  Novel Coronavirus, NAA (Hosp order, Send-out to Ref Lab; TAT 18-24 hrs     Status: Abnormal   Collection Time: 04/24/19  8:54 PM   Specimen: Nasopharyngeal Swab; Respiratory  Result Value Ref Range Status   SARS-CoV-2, NAA DETECTED (A) NOT DETECTED Final    Comment: RESULT CALLED TO, READ BACK BY AND VERIFIED WITH: RN L BERDICK [phone] MLM (NOTE)                  Client Requested Flag This nucleic acid amplification test was developed and its performance characteristics determined by [facility]. Nucleic acid amplification tests include PCR and TMA. This test has not been FDA cleared or approved. This test has been authorized by FDA under an Emergency Use Authorization (EUA). This test is only authorized for the duration of time the declaration that circumstances exist justifying the authorization of the emergency use of in vitro diagnostic tests for detection of SARS-CoV-2 virus and/or diagnosis of COVID-19 infection under section 564(b)(1) of the Act, 21 U.S.C. 562ZHY-8(M360bbb-3(b) (1), unless the authorization is terminated or revoked sooner. When diagnostic testing is negative, the possibility of a false negative result should be considered in the context of a patient's recent exposures and the presence of clinical signs a nd symptoms consistent with  COVID-19. An individual without symptoms of COVID- 19 and who is not shedding SARS-CoV-2 virus would expect to have a negative (not detected) result in this assay. Performed At: [facility] [address] [place], [place] [id number] Jolene SchimkeNagendra Sanjai MD BM:[id number]:[phone]    Coronavirus Source NASOPHARYNGEAL  Final    Comment: Performed at [facility], [address]  [place], [place] [address]  Blood Culture (routine x 2)     Status: None (Preliminary result)   Collection Time: 04/28/19  9:40 AM   Specimen: BLOOD  Result Value Ref Range Status   Specimen Description BLOOD RIGHT ANTECUBITAL  Final   Special Requests   Final    BOTTLES DRAWN AEROBIC AND ANAEROBIC Blood Culture results may not be optimal due to an excessive volume of blood received in culture bottles   Culture   Final    NO GROWTH 2 DAYS Performed at [facility], [place] [address] [place], [place] [address]    Report Status PENDING  Incomplete  Blood Culture (routine x 2)     Status: None (Preliminary result)   Collection Time: 04/28/19  3:54 PM   Specimen: BLOOD  Result Value Ref Range Status   Specimen Description BLOOD LEFT ANTECUBITAL  Final   Special Requests   Final    BOTTLES DRAWN AEROBIC AND ANAEROBIC Blood Culture results may not be optimal due to an inadequate volume of blood received in culture bottles   Culture   Final    NO GROWTH 2 DAYS Performed at [facility], [place] [address], [place], [place] [address]    Report Status PENDING  Incomplete     Scheduled Meds: . allopurinol  100 mg Oral Daily  . amLODipine  5 mg Oral Daily  . aspirin EC  81 mg Oral Daily  . carvedilol  12.5 mg Oral BID  . dexamethasone (DECADRON) injection  6 mg Intravenous Q24H  . enoxaparin (LOVENOX) injection  55 mg Subcutaneous Daily  . feeding supplement (ENSURE ENLIVE)  237 mL Oral BID BM  . insulin aspart  0-20 Units Subcutaneous TID WC  . insulin aspart  0-5 Units Subcutaneous QHS  . insulin  glargine  14 Units Subcutaneous BID  . linagliptin  5 mg Oral Daily  . multivitamin with minerals  1 tablet Oral Daily  . rosuvastatin  20 mg Oral Daily  . tamsulosin  0.4 mg Oral Daily   Continuous Infusions: . azithromycin 500 mg (04/30/19 1302)  . cefTRIAXone (ROCEPHIN)  IV 2 g (04/30/19 1305)  . remdesivir 100 mg in NS 100 mL 100 mg (04/30/19 0921)     LOS: 2 days   Cherene Altes, MD [facility] Office  [phone] Pager - Text Page per Amion  If 7PM-7AM, please contact night-coverage per Amion 04/30/2019, 3:22 PM

## 2019-04-30 NOTE — Plan of Care (Signed)
  Problem: Education: Goal: Knowledge of risk factors and measures for prevention of condition will improve Outcome: Progressing   Problem: Coping: Goal: Psychosocial and spiritual needs will be supported Outcome: Progressing   Problem: Respiratory: Goal: Will maintain a patent airway Outcome: Progressing Goal: Complications related to the disease process, condition or treatment will be avoided or minimized Outcome: Progressing   

## 2019-05-01 DIAGNOSIS — K760 Fatty (change of) liver, not elsewhere classified: Secondary | ICD-10-CM | POA: Diagnosis present

## 2019-05-01 DIAGNOSIS — I10 Essential (primary) hypertension: Secondary | ICD-10-CM

## 2019-05-01 DIAGNOSIS — E669 Obesity, unspecified: Secondary | ICD-10-CM

## 2019-05-01 DIAGNOSIS — N182 Chronic kidney disease, stage 2 (mild): Secondary | ICD-10-CM | POA: Diagnosis present

## 2019-05-01 DIAGNOSIS — J9601 Acute respiratory failure with hypoxia: Secondary | ICD-10-CM

## 2019-05-01 LAB — CBC WITH DIFFERENTIAL/PLATELET
Abs Immature Granulocytes: 0.1 10*3/uL — ABNORMAL HIGH (ref 0.00–0.07)
Basophils Absolute: 0 10*3/uL (ref 0.0–0.1)
Basophils Relative: 0 %
Eosinophils Absolute: 0 10*3/uL (ref 0.0–0.5)
Eosinophils Relative: 0 %
HCT: 40.7 % (ref 39.0–52.0)
Hemoglobin: 12.5 g/dL — ABNORMAL LOW (ref 13.0–17.0)
Immature Granulocytes: 1 %
Lymphocytes Relative: 7 %
Lymphs Abs: 1.1 10*3/uL (ref 0.7–4.0)
MCH: 20.7 pg — ABNORMAL LOW (ref 26.0–34.0)
MCHC: 30.7 g/dL (ref 30.0–36.0)
MCV: 67.5 fL — ABNORMAL LOW (ref 80.0–100.0)
Monocytes Absolute: 1.3 10*3/uL — ABNORMAL HIGH (ref 0.1–1.0)
Monocytes Relative: 8 %
Neutro Abs: 13 10*3/uL — ABNORMAL HIGH (ref 1.7–7.7)
Neutrophils Relative %: 84 %
Platelets: 386 10*3/uL (ref 150–400)
RBC: 6.03 MIL/uL — ABNORMAL HIGH (ref 4.22–5.81)
RDW: 14.6 % (ref 11.5–15.5)
WBC: 15.5 10*3/uL — ABNORMAL HIGH (ref 4.0–10.5)
nRBC: 0 % (ref 0.0–0.2)

## 2019-05-01 LAB — COMPREHENSIVE METABOLIC PANEL
ALT: 32 U/L (ref 0–44)
AST: 34 U/L (ref 15–41)
Albumin: 2.6 g/dL — ABNORMAL LOW (ref 3.5–5.0)
Alkaline Phosphatase: 65 U/L (ref 38–126)
Anion gap: 10 (ref 5–15)
BUN: 42 mg/dL — ABNORMAL HIGH (ref 8–23)
CO2: 22 mmol/L (ref 22–32)
Calcium: 8.7 mg/dL — ABNORMAL LOW (ref 8.9–10.3)
Chloride: 105 mmol/L (ref 98–111)
Creatinine, Ser: 1.2 mg/dL (ref 0.61–1.24)
GFR calc Af Amer: >60 mL/min (ref >60)
GFR calc non Af Amer: >60 mL/min (ref >60)
Glucose, Bld: 201 mg/dL — ABNORMAL HIGH (ref 70–99)
Potassium: 4.5 mmol/L (ref 3.5–5.1)
Sodium: 137 mmol/L (ref 135–145)
Total Bilirubin: 0.5 mg/dL (ref 0.3–1.2)
Total Protein: 8.2 g/dL — ABNORMAL HIGH (ref 6.5–8.1)

## 2019-05-01 LAB — GLUCOSE, CAPILLARY
Glucose-Capillary: 197 mg/dL — ABNORMAL HIGH (ref 70–99)
Glucose-Capillary: 291 mg/dL — ABNORMAL HIGH (ref 70–99)
Glucose-Capillary: 284 mg/dL — ABNORMAL HIGH (ref 70–99)
Glucose-Capillary: 316 mg/dL — ABNORMAL HIGH (ref 70–99)

## 2019-05-01 LAB — MAGNESIUM: Magnesium: 2.8 mg/dL — ABNORMAL HIGH (ref 1.7–2.4)

## 2019-05-01 LAB — PROCALCITONIN: Procalcitonin: 0.29 ng/mL

## 2019-05-01 LAB — PHOSPHORUS: Phosphorus: 3.6 mg/dL (ref 2.5–4.6)

## 2019-05-01 LAB — D-DIMER, QUANTITATIVE: D-Dimer, Quant: 2.05 ug/mL-FEU — ABNORMAL HIGH (ref 0.00–0.50)

## 2019-05-01 LAB — C-REACTIVE PROTEIN: CRP: 8.6 mg/dL — ABNORMAL HIGH (ref <1.0)

## 2019-05-01 LAB — FERRITIN: Ferritin: 687 ng/mL — ABNORMAL HIGH (ref 24–336)

## 2019-05-01 MED ORDER — GUAIFENESIN-DM 100-10 MG/5ML PO SYRP
5.0000 mL | ORAL_SOLUTION | ORAL | Status: DC | PRN
  Administered 2019-05-01 (×2): 5 mL via ORAL
  Filled 2019-05-01 (×3): qty 10

## 2019-05-01 NOTE — Progress Notes (Signed)
Inpatient Diabetes Program Recommendations  AACE/ADA: New Consensus Statement on Inpatient Glycemic Control (2015)  Target Ranges:  Prepandial:   less than 140 mg/dL      Peak postprandial:   less than 180 mg/dL (1-2 hours)      Critically ill patients:  140 - 180 mg/dL   Lab Results  Component Value Date   GLUCAP 197 (H) 05/01/2019   HGBA1C 9.2 (H) 04/29/2019    Review of Glycemic Control Results for FLEMON, KELTY (MRN [id number]) as of 05/01/2019 09:31  Ref. Range 04/30/2019 12:16 04/30/2019 15:46 04/30/2019 19:37 05/01/2019 07:47  Glucose-Capillary Latest Ref Range: 70 - 99 mg/dL 291 (H) 342 (H) 374 (H) 197 (H)    Outpatient Diabetes medications: Jardiance 10 mg every day, Glipizide 10 BID, Victoza 1.8 mg QD Current orders for Inpatient glycemic control: Novolog 0-20 units TID, Novolog 0-5 units at bedtime, Lantus 14 units BID Decadron 6 mg every day  Inpatient Diabetes Program Recommendations:    Consider increasing Lantus 16 units BID and adding Novolog 6 units TID (assuming patient is consuming >50% of meal).   Thanks, Bronson Curb, MSN, RNC-OB Diabetes Coordinator [phone] (8a-5p)

## 2019-05-01 NOTE — Progress Notes (Signed)
Occupational Therapy Evaluation Patient Details Name: Runell GessGarry Bun MRN: [id number] DOB: 11/11/1955 Today's Date: 05/01/2019    History of Present Illness Pt is a 63 y.o. male admitted 04/28/19 with worsening SOB and cough; initial (+) COVID-19 on 04/24/19. PMH includes DM2, HTN, CKD II, bilateral club foot.   Clinical Impression   PTA pt lived with his father, independent in all ADL, IADL, and mobility tasks. Pt does not use an assistive device for ambulation and reports 0 falls in the last 6 months. Pt still drives and is retired. Pt does not use oxygen at home and is currently on 15L HFNC. Pt currently independent to min assist for self-care and functional transfer tasks. Pt transferred to bedside chair with SpO2 decreasing to 85% on 15L HFNC. Pt reported mod shortness of breath and increased anxiety. Educated/instructed pt on relaxation strategies with good understanding and follow through. Pt tolerated standing at the sink ~4 min to complete grooming/hygiene tasks. Pt's SpO2 decreased to 79% with pt reporting min shortness of breath. Pt required ~2 min seated rest break for SpO2 to increase back into 90s. 2/4 DOE. Pt demonstrates decreased strength, endurance, balance, standing tolerance, and activity tolerance impacting ability to complete self-care and functional transfer tasks. Recommend skilled OT services to address above deficits in order to promote function and prevent further decline. Recommend HH OT for continued rehab following hospital discharge.     Follow Up Recommendations  Home health OT;Supervision - Intermittent    Equipment Recommendations  None recommended by OT    Recommendations for Other Services       Precautions / Restrictions Precautions Precautions: Fall;Other (comment) Precaution Comments: 15L HFNC Restrictions Weight Bearing Restrictions: No      Mobility Bed Mobility Overal bed mobility: Needs Assistance Bed Mobility: Supine to Sit     Supine  to sit: Supervision;HOB elevated        Transfers Overall transfer level: Needs assistance Equipment used: 1 person hand held assist Transfers: Sit to/from UGI CorporationStand;Stand Pivot Transfers Sit to Stand: Min guard Stand pivot transfers: Min assist       General transfer comment: hand held assist    Balance Overall balance assessment: Needs assistance   Sitting balance-Leahy Scale: Good       Standing balance-Leahy Scale: Fair                             ADL either performed or assessed with clinical judgement   ADL Overall ADL's : Needs assistance/impaired Eating/Feeding: Independent;Sitting   Grooming: Min guard;Standing   Upper Body Bathing: Supervision/ safety;Set up;Sitting   Lower Body Bathing: Supervison/ safety;Set up;Min guard;Sit to/from stand   Upper Body Dressing : Supervision/safety;Set up;Sitting   Lower Body Dressing: Supervision/safety;Set up;Min guard;Sit to/from stand   Toilet Transfer: Min guard;Minimal assistance;BSC   Toileting- ArchitectClothing Manipulation and Hygiene: Min guard;Sit to/from stand       Functional mobility during ADLs: Min guard;Minimal assistance General ADL Comments: Pt tolerated standing ~4 min at the sink to complete grooming/hygiene tasks.     Vision Baseline Vision/History: No visual deficits       Perception     Praxis      Pertinent Vitals/Pain Pain Assessment: No/denies pain     Hand Dominance Right   Extremity/Trunk Assessment Upper Extremity Assessment Upper Extremity Assessment: Overall WFL for tasks assessed   Lower Extremity Assessment Lower Extremity Assessment: Defer to PT evaluation       Communication Communication Communication:  No difficulties   Cognition Arousal/Alertness: Awake/alert Behavior During Therapy: WFL for tasks assessed/performed Overall Cognitive Status: Within Functional Limits for tasks assessed                                     General Comments   Pt on 15L HFNC with SpO2 at 92% at rest. SpO2 decreased to 85% during transfer to bedside chair and 79% following grooming/hygiene tasks at the sink. Pt required ~2 min seated recovery to return to 94% on 15L HFNC.     Exercises Exercises: Other exercises Other Exercises Other Exercises: Incentive spirometer x 10 with min cues on technique. Pulling 866mL. Other Exercises: Flutter valve x 10 with min cues on technique.  Other Exercises: Pursed lip breathing with min cues on technique.    Shoulder Instructions      Home Living Family/patient expects to be discharged to:: Private residence Living Arrangements: Parent(88 y.o. father. Father is independent.) Available Help at Discharge: Family Type of Home: House Home Access: Stairs to enter;Ramped entrance Entrance Stairs-Number of Steps: 3(also has a ramp)   Home Layout: One level     Bathroom Shower/Tub: Occupational psychologist: Standard     Home Equipment: Shower seat          Prior Functioning/Environment Level of Independence: Independent        Comments: Pt independent in ADLs, IADLs, and mobility PTA. Pt does not ambulate with an assistive device. Pt reports 0 falls in the last 6 months. Pt still drives and is retired. Pt does not use oxygen at home.         OT Problem List: Decreased strength;Decreased activity tolerance;Impaired balance (sitting and/or standing);Cardiopulmonary status limiting activity      OT Treatment/Interventions: Self-care/ADL training;Therapeutic exercise;Neuromuscular education;Energy conservation;DME and/or AE instruction;Therapeutic activities;Patient/family education;Balance training    OT Goals(Current goals can be found in the care plan section) Acute Rehab OT Goals Patient Stated Goal: To go home before Christmas Time For Goal Achievement: 05/15/19 Potential to Achieve Goals: Good ADL Goals Pt Will Perform Grooming: with modified independence;standing Pt Will Perform  Lower Body Bathing: with modified independence;sit to/from stand Pt Will Perform Lower Body Dressing: with modified independence;sit to/from stand Pt Will Transfer to Toilet: with modified independence;ambulating;regular height toilet Pt Will Perform Toileting - Clothing Manipulation and hygiene: with modified independence;sit to/from stand Additional ADL Goal #1: Pt to recall and verbalize 3 energy conservation strategies with 0 verbal cues. Additional ADL Goal #2: Pt to tolerate standing up to 5 min independently with SpO2 maintaining above 90%, in preparation for ADLs.  OT Frequency: Min 2X/week   Barriers to D/C:            Co-evaluation              AM-PAC OT "6 Clicks" Daily Activity     Outcome Measure Help from another person eating meals?: None Help from another person taking care of personal grooming?: A Little Help from another person toileting, which includes using toliet, bedpan, or urinal?: A Little Help from another person bathing (including washing, rinsing, drying)?: A Little Help from another person to put on and taking off regular upper body clothing?: A Little Help from another person to put on and taking off regular lower body clothing?: A Little 6 Click Score: 19   End of Session Equipment Utilized During Treatment: Oxygen Nurse Communication: Mobility status  Activity  Tolerance: Patient limited by fatigue(Limited by SOB) Patient left: in chair;with call bell/phone within reach  OT Visit Diagnosis: Unsteadiness on feet (R26.81);Muscle weakness (generalized) (M62.81)                Time: 1002-1050 OT Time Calculation (min): 48 min Charges:  OT General Charges $OT Visit: 1 Visit OT Evaluation $OT Eval Moderate Complexity: 1 Mod OT Treatments $Self Care/Home Management : 8-22 mins $Therapeutic Activity: 8-22 mins  Peterson Ao OTR/L [phone]    Peterson Ao 05/01/2019, 11:14 AM

## 2019-05-01 NOTE — Progress Notes (Addendum)
PROGRESS NOTE  Montrey Buist UDJ:[id number] DOB: 26-Jan-1956 DOA: 04/28/2019 PCP: Harvie Heck, MD  HPI/Recap of past 24 hours: 63yo with a history of DM, CAD, HTN, HLD, and bilateral clubfoot who originally presented to an urgent care center 12/16 with fatigue, anorexia, and loss of taste and was ultimately found to be Covid positive. Since that time he has developed increasing shortness of breath with refractory cough. He presented to the [facility] ED 12/20 where a CXR noted bilateral pulmonary infiltrates and he required 10 L high flow nasal cannula oxygen to keep his saturations in the mid 90s & transferred to [facility].  Since admission, he initially was improving & started on Remdisivir, Decadron plus IV antibiotics.  Initially he required less oxygen, but over the last 24 hours, needed to increase his high flow oxygen up to 15 L.  This morning, he is feeling a little bit better.  Oxygen is being attempted to wean.  Complains of some productive cough with mildly bloody sputum.  Continue to use an spirometer.  Assessment/Plan: Principal Problem:   Acute respiratory failure with hypoxia/pneumonia due to COVID-19 virus: Continue IV antibiotics as procalcitonin level still elevated.  CRP and ferritin both trending downward.  He is still requiring a large amount of oxygen, will continue IV steroids.  Continue Remdisivir, finishes tomorrow. Active Problems:   Uncontrolled diabetes mellitus with complications (HCC)  Hepatic steatosis: Incidentally noted on CT scan of chest.  Outpatient follow-up.  Essential HYPERTENSION: Blood pressure remaining stable.,  Continue home medications.    Obesity, Class II, BMI 35-39.9: Patient meets criteria for BMI greater than 35.    Prostate hypertrophy: Stable, continue home medications.  Stage II chronic kidney disease: Remains stable at baseline.  Code Status: Full code  Family Communication: Discussed with Sister  Disposition Plan: Anticipate  discharge home once his oxygenation is better stabilized and weaned significantly downward   Consultants:  None  Procedures:  None  Antimicrobials:  IV Rocephin and Zithromax 12/20-present  IV Remdisivir 12/20-12/24  DVT prophylaxis: Lovenox   Objective: Vitals:   05/01/19 0400 05/01/19 0800  BP: 128/79 133/81  Pulse: 79 77  Resp: (!) 24 (!) 37  Temp: 97.6 F (36.4 C) 98 F (36.7 C)  SpO2: 100% 99%    Intake/Output Summary (Last 24 hours) at 05/01/2019 1049 Last data filed at 05/01/2019 1024 Gross per 24 hour  Intake 1170 ml  Output 1600 ml  Net -430 ml   Filed Weights   04/28/19 0919  Weight: 108.9 kg   Body mass index is 36.49 kg/m.  Exam:   General: Alert and oriented x3, minimal distress from tachypnea  HEENT: Normocephalic and atraumatic, mucous membranes are moist  Neck: Supple, no JVD  Cardiovascular: Regular rate and rhythm, S1-S2  Respiratory: Decreased breath sounds bibasilar  Abdomen: Soft, nontender, nondistended, positive bowel sounds  Musculoskeletal: No clubbing or cyanosis, trace pitting edema  Skin: No skin breaks, tears or lesions  Neuro: No focal deficits  Psychiatry: Appropriate, no evidence of psychoses   Data Reviewed: CBC: Recent Labs  Lab 04/28/19 0939 04/29/19 0234 04/30/19 0415 05/01/19 0120  WBC 8.2 8.7 13.2* 15.5*  NEUTROABS 6.0 6.3 11.0* 13.0*  HGB 13.5 12.6* 12.7* 12.5*  HCT 42.0 39.9 40.6 40.7  MCV 67.3* 68.7* 68.0* 67.5*  PLT 295 266 321 588   Basic Metabolic Panel: Recent Labs  Lab 04/28/19 0939 04/29/19 0234 04/30/19 0415 05/01/19 0120  NA 131* 133* 137 137  K 4.0 4.6 4.3 4.5  CL 100 100 104 105  CO2 19* 21* 21* 22  GLUCOSE 265* 185* 164* 201*  BUN 15 27* 41* 42*  CREATININE 1.28* 1.28* 1.23 1.20  CALCIUM 8.4* 8.5* 8.8* 8.7*  MG  --  2.4 2.6* 2.8*  PHOS  --  2.5 4.2 3.6   GFR: Estimated Creatinine Clearance: 76.4 mL/min (by C-G formula based on SCr of 1.2 mg/dL). Liver Function  Tests: Recent Labs  Lab 04/28/19 0939 04/29/19 0234 04/30/19 0415 05/01/19 0120  AST 60* 50* 42* 34  ALT 34 33 30 32  ALKPHOS 66 58 58 65  BILITOT 0.5 0.2* 0.3 0.5  PROT 8.4* 8.3* 8.2* 8.2*  ALBUMIN 2.8* 2.7* 2.7* 2.6*   No results for input(s): LIPASE, AMYLASE in the last 168 hours. No results for input(s): AMMONIA in the last 168 hours. Coagulation Profile: No results for input(s): INR, PROTIME in the last 168 hours. Cardiac Enzymes: No results for input(s): CKTOTAL, CKMB, CKMBINDEX, TROPONINI in the last 168 hours. BNP (last 3 results) No results for input(s): PROBNP in the last 8760 hours. HbA1C: Recent Labs    04/29/19 0234  HGBA1C 9.2*   CBG: Recent Labs  Lab 04/30/19 0726 04/30/19 1216 04/30/19 1546 04/30/19 1937 05/01/19 0747  GLUCAP 163* 291* 342* 374* 197*   Lipid Profile: No results for input(s): CHOL, HDL, LDLCALC, TRIG, CHOLHDL, LDLDIRECT in the last 72 hours. Thyroid Function Tests: No results for input(s): TSH, T4TOTAL, FREET4, T3FREE, THYROIDAB in the last 72 hours. Anemia Panel: Recent Labs    04/30/19 0415 05/01/19 0120  FERRITIN 823* 687*   Urine analysis:    Component Value Date/Time   COLORURINE YELLOW 07/09/2015 2219   APPEARANCEUR Clear 01/12/2016 1634   LABSPEC 1.015 07/09/2015 2219   PHURINE 7.0 07/09/2015 2219   GLUCOSEU 3+ (A) 01/12/2016 1634   HGBUR NEGATIVE 07/09/2015 2219   BILIRUBINUR negative 12/06/2018 1659   BILIRUBINUR Negative 01/12/2016 1634   KETONESUR NEGATIVE 07/09/2015 2219   PROTEINUR Negative 12/06/2018 1659   PROTEINUR Negative 01/12/2016 1634   PROTEINUR NEGATIVE 07/09/2015 2219   UROBILINOGEN 2.0 (A) 12/06/2018 1659   UROBILINOGEN 1 10/23/2013 1555   NITRITE negative 12/06/2018 1659   NITRITE Negative 01/12/2016 1634   NITRITE NEGATIVE 07/09/2015 2219   LEUKOCYTESUR Negative 12/06/2018 1659   LEUKOCYTESUR Negative 01/12/2016 1634   Sepsis Labs: @LABRCNTIP (procalcitonin:4,lacticidven:4)  ) Recent  Results (from the past 240 hour(s))  Novel Coronavirus, NAA (Hosp order, Send-out to Ref Lab; TAT 18-24 hrs     Status: Abnormal   Collection Time: 04/24/19  8:54 PM   Specimen: Nasopharyngeal Swab; Respiratory  Result Value Ref Range Status   SARS-CoV-2, NAA DETECTED (A) NOT DETECTED Final    Comment: RESULT CALLED TO, READ BACK BY AND VERIFIED WITH: RN L BERDICK [phone] MLM (NOTE)                  Client Requested Flag This nucleic acid amplification test was developed and its performance characteristics determined by [facility]. Nucleic acid amplification tests include PCR and TMA. This test has not been FDA cleared or approved. This test has been authorized by FDA under an Emergency Use Authorization (EUA). This test is only authorized for the duration of time the declaration that circumstances exist justifying the authorization of the emergency use of in vitro diagnostic tests for detection of SARS-CoV-2 virus and/or diagnosis of COVID-19 infection under section 564(b)(1) of the Act, 21 U.S.C. 409WJX-9(J360bbb-3(b) (1), unless the authorization is terminated or revoked sooner.  When diagnostic testing is negative, the possibility of a false negative result should be considered in the context of a patient's recent exposures and the presence of clinical signs a nd symptoms consistent with COVID-19. An individual without symptoms of COVID- 19 and who is not shedding SARS-CoV-2 virus would expect to have a negative (not detected) result in this assay. Performed At: [facility] [address] [place], [place] [id number] Jolene Schimke MD JO:[id number]    Coronavirus Source NASOPHARYNGEAL  Final    Comment: Performed at [facility], [address] [place], [place] [address]  Blood Culture (routine x 2)     Status: None (Preliminary result)   Collection Time: 04/28/19  9:40 AM   Specimen: BLOOD  Result Value Ref Range Status   Specimen Description BLOOD RIGHT  ANTECUBITAL  Final   Special Requests   Final    BOTTLES DRAWN AEROBIC AND ANAEROBIC Blood Culture results may not be optimal due to an excessive volume of blood received in culture bottles   Culture   Final    NO GROWTH 3 DAYS Performed at [facility], [address], [place], [place] [address]    Report Status PENDING  Incomplete  Blood Culture (routine x 2)     Status: None (Preliminary result)   Collection Time: 04/28/19  3:54 PM   Specimen: BLOOD  Result Value Ref Range Status   Specimen Description BLOOD LEFT ANTECUBITAL  Final   Special Requests   Final    BOTTLES DRAWN AEROBIC AND ANAEROBIC Blood Culture results may not be optimal due to an inadequate volume of blood received in culture bottles   Culture   Final    NO GROWTH 3 DAYS Performed at [facility], [address], [place], [place] [address]    Report Status PENDING  Incomplete      Studies: No results found.  Scheduled Meds: . allopurinol  100 mg Oral Daily  . amLODipine  5 mg Oral Daily  . aspirin EC  81 mg Oral Daily  . carvedilol  12.5 mg Oral BID  . dexamethasone (DECADRON) injection  6 mg Intravenous Q24H  . enoxaparin (LOVENOX) injection  55 mg Subcutaneous Daily  . feeding supplement (ENSURE ENLIVE)  237 mL Oral BID BM  . insulin aspart  0-20 Units Subcutaneous TID WC  . insulin aspart  0-5 Units Subcutaneous QHS  . insulin glargine  14 Units Subcutaneous BID  . linagliptin  5 mg Oral Daily  . multivitamin with minerals  1 tablet Oral Daily  . rosuvastatin  20 mg Oral Daily  . tamsulosin  0.4 mg Oral Daily    Continuous Infusions: . azithromycin 500 mg (04/30/19 1302)  . cefTRIAXone (ROCEPHIN)  IV 2 g (04/30/19 1305)  . remdesivir 100 mg in NS 100 mL 100 mg (05/01/19 1005)     LOS: 3 days     Hollice Espy, MD [facility]  To reach me or the doctor on call, go to: [email or web address] Password [facility]  05/01/2019, 10:49 AM

## 2019-05-01 NOTE — Plan of Care (Signed)
  Problem: Education: Goal: Knowledge of risk factors and measures for prevention of condition will improve Outcome: Progressing   Problem: Coping: Goal: Psychosocial and spiritual needs will be supported Outcome: Progressing   Problem: Respiratory: Goal: Will maintain a patent airway Outcome: Progressing Goal: Complications related to the disease process, condition or treatment will be avoided or minimized Outcome: Progressing   

## 2019-05-01 NOTE — Evaluation (Signed)
Physical Therapy Evaluation Patient Details Name: Daniel Perkins MRN: [id number] DOB: Oct 12, 1955 Today's Date: 05/01/2019   History of Present Illness  Pt is a 63 y.o. male admitted 04/28/19 with worsening SOB and cough; initial (+) COVID-19 on 04/24/19. PMH includes DM2, HTN, CKD II, bilateral club foot.    Clinical Impression  Pt presents with an overall decrease in functional mobility secondary to above. PTA, pt independent, retired from work, and lives with father (who is apparently also hospitalized with COVID-19). Today, pt moving well with intermittent minA for balance; limited by decreased activity tolerance and significant DOE with SpO2 down to 70s on 15L HFNC with minimal activity. Pt able to recover to >/88% with ~2 min seated rest break and pursed lip breathing. Pt motivated to participate and regain PLOF. Pt would benefit from continued acute PT services to maximize functional mobility and independence prior to d/c with HHPT services.     Follow Up Recommendations Home health PT;Supervision - Intermittent    Equipment Recommendations  (TBD)    Recommendations for Other Services       Precautions / Restrictions Precautions Precautions: Fall;Other (comment) Precaution Comments: 15L HFNC, quick to desaturate but recovers quickly with sitting Restrictions Weight Bearing Restrictions: No      Mobility  Bed Mobility               General bed mobility comments: Received sitting in recliner  Transfers Overall transfer level: Needs assistance Equipment used: None;1 person hand held assist Transfers: Sit to/from Stand Sit to Stand: Min assist;Min guard         General transfer comment: Initial standing requiring minA for HHA to prevent posterior LOB; pt able to perform subsequent trials with min guard without UE support  Ambulation/Gait Ambulation/Gait assistance: Min guard;Min assist Gait Distance (Feet): 2 Feet Assistive device: 1 person hand held  assist Gait Pattern/deviations: Step-through pattern;Decreased stride length   Gait velocity interpretation: <1.31 ft/sec, indicative of household ambulator General Gait Details: Short bouts of activity, marching in place initially with minA for HHA then with min guard, steps forwards/backwards with HHA to maintain balance. Seated rest between activity bouts due to DOE and SpO2 down to 70s on 15L HFNC, quick to recover to >/88% with seated rest and deep breathing  Stairs            Wheelchair Mobility    Modified Rankin (Stroke Patients Only)       Balance Overall balance assessment: Needs assistance   Sitting balance-Leahy Scale: Good       Standing balance-Leahy Scale: Fair Standing balance comment: Dynamic standing balance improved with UE support                             Pertinent Vitals/Pain Pain Assessment: No/denies pain    Home Living Family/patient expects to be discharged to:: Private residence Living Arrangements: Parent Available Help at Discharge: Family;Available PRN/intermittently Type of Home: House Home Access: Stairs to enter;Ramped entrance   Entrance Stairs-Number of Steps: 3 Home Layout: One level Home Equipment: Shower seat Additional Comments: Lives with 82 y.o. father who is independent at baseline, but currently admitted at [facility] with COVID    Prior Function Level of Independence: Independent         Comments: Pt independent in ADLs, IADLs, and mobility. Pt does not ambulate with an assistive device. Pt reports 0 falls in the last 6 months. Pt still drives and is retired. Pt does  not use oxygen at home.     Hand Dominance   Dominant Hand: Right    Extremity/Trunk Assessment   Upper Extremity Assessment Upper Extremity Assessment: Overall WFL for tasks assessed    Lower Extremity Assessment Lower Extremity Assessment: Generalized weakness       Communication   Communication: No difficulties  Cognition  Arousal/Alertness: Awake/alert Behavior During Therapy: WFL for tasks assessed/performed Overall Cognitive Status: Within Functional Limits for tasks assessed                                        General Comments      Exercises General Exercises - Lower Extremity Long Arc Quad: AROM;Both;Seated Hip Flexion/Marching: AROM;Both;Seated   Assessment/Plan    PT Assessment Patient needs continued PT services  PT Problem List Decreased strength;Decreased activity tolerance;Decreased balance;Decreased mobility;Decreased knowledge of use of DME;Cardiopulmonary status limiting activity       PT Treatment Interventions DME instruction;Gait training;Stair training;Functional mobility training;Therapeutic activities;Therapeutic exercise;Balance training;Patient/family education    PT Goals (Current goals can be found in the Care Plan section)  Acute Rehab PT Goals Patient Stated Goal: Return home PT Goal Formulation: With patient Time For Goal Achievement: 05/15/19 Potential to Achieve Goals: Good    Frequency Min 3X/week   Barriers to discharge Decreased caregiver support      Co-evaluation               AM-PAC PT "6 Clicks" Mobility  Outcome Measure Help needed turning from your back to your side while in a flat bed without using bedrails?: A Little Help needed moving from lying on your back to sitting on the side of a flat bed without using bedrails?: A Little Help needed moving to and from a bed to a chair (including a wheelchair)?: A Little Help needed standing up from a chair using your arms (e.g., wheelchair or bedside chair)?: A Little Help needed to walk in hospital room?: A Little Help needed climbing 3-5 steps with a railing? : A Little 6 Click Score: 18    End of Session Equipment Utilized During Treatment: Oxygen Activity Tolerance: Patient tolerated treatment well;Patient limited by fatigue Patient left: in chair;with call bell/phone within  reach Nurse Communication: Mobility status PT Visit Diagnosis: Other abnormalities of gait and mobility (R26.89)    Time: 4098-1191 PT Time Calculation (min) (ACUTE ONLY): 24 min   Charges:   PT Evaluation $PT Eval Moderate Complexity: 1 Mod PT Treatments $Therapeutic Exercise: 8-22 mins      Ina Homes, PT, DPT Acute Rehabilitation Services  Pager [phone] Office [phone]  Malachy Chamber 05/01/2019, 5:27 PM

## 2019-05-02 LAB — CBC WITH DIFFERENTIAL/PLATELET
Abs Immature Granulocytes: 0.14 10*3/uL — ABNORMAL HIGH (ref 0.00–0.07)
Basophils Absolute: 0 10*3/uL (ref 0.0–0.1)
Basophils Relative: 0 %
Eosinophils Absolute: 0 10*3/uL (ref 0.0–0.5)
Eosinophils Relative: 0 %
HCT: 41.5 % (ref 39.0–52.0)
Hemoglobin: 12.8 g/dL — ABNORMAL LOW (ref 13.0–17.0)
Immature Granulocytes: 1 %
Lymphocytes Relative: 9 %
Lymphs Abs: 1.2 10*3/uL (ref 0.7–4.0)
MCH: 20.9 pg — ABNORMAL LOW (ref 26.0–34.0)
MCHC: 30.8 g/dL (ref 30.0–36.0)
MCV: 67.8 fL — ABNORMAL LOW (ref 80.0–100.0)
Monocytes Absolute: 1.5 10*3/uL — ABNORMAL HIGH (ref 0.1–1.0)
Monocytes Relative: 10 %
Neutro Abs: 11.2 10*3/uL — ABNORMAL HIGH (ref 1.7–7.7)
Neutrophils Relative %: 80 %
Platelets: 422 10*3/uL — ABNORMAL HIGH (ref 150–400)
RBC: 6.12 MIL/uL — ABNORMAL HIGH (ref 4.22–5.81)
RDW: 14.7 % (ref 11.5–15.5)
WBC: 14 10*3/uL — ABNORMAL HIGH (ref 4.0–10.5)
nRBC: 0 % (ref 0.0–0.2)

## 2019-05-02 LAB — COMPREHENSIVE METABOLIC PANEL
ALT: 32 U/L (ref 0–44)
AST: 28 U/L (ref 15–41)
Albumin: 2.5 g/dL — ABNORMAL LOW (ref 3.5–5.0)
Alkaline Phosphatase: 63 U/L (ref 38–126)
Anion gap: 10 (ref 5–15)
BUN: 33 mg/dL — ABNORMAL HIGH (ref 8–23)
CO2: 22 mmol/L (ref 22–32)
Calcium: 8.8 mg/dL — ABNORMAL LOW (ref 8.9–10.3)
Chloride: 107 mmol/L (ref 98–111)
Creatinine, Ser: 1 mg/dL (ref 0.61–1.24)
GFR calc Af Amer: >60 mL/min (ref >60)
GFR calc non Af Amer: >60 mL/min (ref >60)
Glucose, Bld: 182 mg/dL — ABNORMAL HIGH (ref 70–99)
Potassium: 4.6 mmol/L (ref 3.5–5.1)
Sodium: 139 mmol/L (ref 135–145)
Total Bilirubin: 0.4 mg/dL (ref 0.3–1.2)
Total Protein: 8.1 g/dL (ref 6.5–8.1)

## 2019-05-02 LAB — MAGNESIUM: Magnesium: 2.7 mg/dL — ABNORMAL HIGH (ref 1.7–2.4)

## 2019-05-02 LAB — D-DIMER, QUANTITATIVE: D-Dimer, Quant: 2.36 ug/mL-FEU — ABNORMAL HIGH (ref 0.00–0.50)

## 2019-05-02 LAB — GLUCOSE, CAPILLARY
Glucose-Capillary: 143 mg/dL — ABNORMAL HIGH (ref 70–99)
Glucose-Capillary: 288 mg/dL — ABNORMAL HIGH (ref 70–99)

## 2019-05-02 LAB — C-REACTIVE PROTEIN: CRP: 5.4 mg/dL — ABNORMAL HIGH (ref <1.0)

## 2019-05-02 LAB — FERRITIN: Ferritin: 662 ng/mL — ABNORMAL HIGH (ref 24–336)

## 2019-05-02 LAB — PROCALCITONIN: Procalcitonin: 0.11 ng/mL

## 2019-05-02 LAB — PHOSPHORUS: Phosphorus: 3.2 mg/dL (ref 2.5–4.6)

## 2019-05-02 MED ORDER — INSULIN GLARGINE 100 UNIT/ML ~~LOC~~ SOLN
16.0000 [IU] | Freq: Two times a day (BID) | SUBCUTANEOUS | Status: DC
  Administered 2019-05-02 – 2019-05-03 (×3): 16 [IU] via SUBCUTANEOUS
  Filled 2019-05-02 (×4): qty 0.16

## 2019-05-02 NOTE — Progress Notes (Signed)
PROGRESS NOTE  Daniel Perkins WUJ:[id number] DOB: 11/08/1955 DOA: 04/28/2019 PCP: Eliezer BottomAslam, Sadia, MD  HPI/Recap of past 24 hours: 63yo with a history of DM, CAD, HTN, HLD, and bilateral clubfoot who originally presented to an urgent care center 12/16 with fatigue, anorexia, and loss of taste and was ultimately found to be Covid positive. Since that time he has developed increasing shortness of breath with refractory cough. He presented to the [facility] ED 12/20 where a CXR noted bilateral pulmonary infiltrates and he required 10 L high flow nasal cannula oxygen to keep his saturations in the mid 90s & [facility].  Since admission, he at first improved after being started on Remdisivir, Decadron plus IV antibiotics.  Initially he required less oxygen, but by evening of 12/22, needed to increase his high flow oxygen up to 15 L.  Have slowly been weaning again, this morning he is at 10 L high flow.  Working well with inspirometer.  Working with physical therapy.  Continues to have occasional bloody sputum  Assessment/Plan: Principal Problem:   Acute respiratory failure with hypoxia/pneumonia due to COVID-19 virus: Continue IV antibiotics as procalcitonin level still elevated.  CRP and ferritin both trending downward.  He is still requiring a large amount of oxygen, will continue IV steroids.  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  Have increased Lantus to 16 units twice a day  Hepatic steatosis: Incidentally noted on CT scan of chest.  Outpatient follow-up.  Essential HYPERTENSION: Blood pressure remaining stable.,  Continue home medications.    Obesity, Class II, BMI 35-39.9: Patient meets criteria for BMI greater than 35.    Prostate hypertrophy: Stable, continue home medications.  Stage II chronic kidney disease: Remains stable at baseline.  Leukocytosis:  Presumed from steroids  Code Status: Full code  Family Communication: Updated sister by phone  Disposition Plan: Anticipate discharge home once his oxygenation is better stabilized and weaned significantly downward   Consultants:  None  Procedures:  None  Antimicrobials:  IV Rocephin and Zithromax 12/20-present  IV Remdisivir 12/20-12/24  DVT prophylaxis: Lovenox   Objective: Vitals:   05/02/19 0810 05/02/19 0924  BP:    Pulse: 87   Resp: (!) 23   Temp:    SpO2: 97% 97%    Intake/Output Summary (Last 24 hours) at 05/02/2019 1112 Last data filed at 05/02/2019 0600 Gross per 24 hour  Intake 1229.96 ml  Output 1150 ml  Net 79.96 ml   Filed Weights   04/28/19 0919  Weight: 108.9 kg   Body mass index is 36.49 kg/m.  Exam:   General: Alert and oriented x3, NAD  HEENT: Normocephalic and atraumatic, mucous membranes are moist  Neck: Supple, no JVD  Cardiovascular: Regular rate and rhythm, S1-S2  Respiratory: Better airway exchange, clear  Abdomen: Soft, nontender, nondistended, positive bowel sounds  Musculoskeletal: No clubbing or cyanosis, trace pitting edema  Skin: No skin breaks, tears or lesions  Neuro: No focal deficits  Psychiatry: Appropriate, no evidence of psychoses   Data Reviewed: CBC: Recent Labs  Lab 04/28/19 0939 04/29/19 0234 04/30/19 0415 05/01/19 0120 05/02/19 0300  WBC 8.2 8.7 13.2* 15.5* 14.0*  NEUTROABS 6.0 6.3 11.0* 13.0* 11.2*  HGB 13.5 12.6* 12.7* 12.5* 12.8*  HCT 42.0 39.9 40.6 40.7 41.5  MCV 67.3* 68.7* 68.0* 67.5* 67.8*  PLT 295 266 321 386 422*   Basic Metabolic Panel: Recent Labs  Lab 04/28/19 0939 04/29/19  0234 04/30/19 0415 05/01/19 0120 05/02/19 0300  NA 131* 133* 137 137 139  K 4.0 4.6 4.3 4.5 4.6  CL 100 100 104 105 107  CO2 19* 21* 21* 22 22  GLUCOSE 265* 185* 164* 201* 182*  BUN 15 27* 41* 42* 33*  CREATININE 1.28* 1.28* 1.23 1.20 1.00  CALCIUM 8.4* 8.5* 8.8* 8.7* 8.8*  MG  --  2.4  2.6* 2.8* 2.7*  PHOS  --  2.5 4.2 3.6 3.2   GFR: Estimated Creatinine Clearance: 91.7 mL/min (by C-G formula based on SCr of 1 mg/dL). Liver Function Tests: Recent Labs  Lab 04/28/19 0939 04/29/19 0234 04/30/19 0415 05/01/19 0120 05/02/19 0300  AST 60* 50* 42* 34 28  ALT 34 33 30 32 32  ALKPHOS 66 58 58 65 63  BILITOT 0.5 0.2* 0.3 0.5 0.4  PROT 8.4* 8.3* 8.2* 8.2* 8.1  ALBUMIN 2.8* 2.7* 2.7* 2.6* 2.5*   No results for input(s): LIPASE, AMYLASE in the last 168 hours. No results for input(s): AMMONIA in the last 168 hours. Coagulation Profile: No results for input(s): INR, PROTIME in the last 168 hours. Cardiac Enzymes: No results for input(s): CKTOTAL, CKMB, CKMBINDEX, TROPONINI in the last 168 hours. BNP (last 3 results) No results for input(s): PROBNP in the last 8760 hours. HbA1C: No results for input(s): HGBA1C in the last 72 hours. CBG: Recent Labs  Lab 05/01/19 0747 05/01/19 1108 05/01/19 1626 05/01/19 1953 05/02/19 0742  GLUCAP 197* 291* 284* 316* 143*   Lipid Profile: No results for input(s): CHOL, HDL, LDLCALC, TRIG, CHOLHDL, LDLDIRECT in the last 72 hours. Thyroid Function Tests: No results for input(s): TSH, T4TOTAL, FREET4, T3FREE, THYROIDAB in the last 72 hours. Anemia Panel: Recent Labs    05/01/19 0120 05/02/19 0300  FERRITIN 687* 662*   Urine analysis:    Component Value Date/Time   COLORURINE YELLOW 07/09/2015 2219   APPEARANCEUR Clear 01/12/2016 1634   LABSPEC 1.015 07/09/2015 2219   PHURINE 7.0 07/09/2015 2219   GLUCOSEU 3+ (A) 01/12/2016 1634   HGBUR NEGATIVE 07/09/2015 2219   BILIRUBINUR negative 12/06/2018 1659   BILIRUBINUR Negative 01/12/2016 1634   KETONESUR NEGATIVE 07/09/2015 2219   PROTEINUR Negative 12/06/2018 1659   PROTEINUR Negative 01/12/2016 1634   PROTEINUR NEGATIVE 07/09/2015 2219   UROBILINOGEN 2.0 (A) 12/06/2018 1659   UROBILINOGEN 1 10/23/2013 1555   NITRITE negative 12/06/2018 1659   NITRITE Negative  01/12/2016 1634   NITRITE NEGATIVE 07/09/2015 2219   LEUKOCYTESUR Negative 12/06/2018 1659   LEUKOCYTESUR Negative 01/12/2016 1634   Sepsis Labs: @LABRCNTIP (procalcitonin:4,lacticidven:4)  ) Recent Results (from the past 240 hour(s))  Novel Coronavirus, NAA (Hosp order, Send-out to Ref Lab; TAT 18-24 hrs     Status: Abnormal   Collection Time: 04/24/19  8:54 PM   Specimen: Nasopharyngeal Swab; Respiratory  Result Value Ref Range Status   SARS-CoV-2, NAA DETECTED (A) NOT DETECTED Final    Comment: RESULT CALLED TO, READ BACK BY AND VERIFIED WITH: RN L BERDICK 04/26/19 1530 MLM (NOTE)                  Client Requested Flag This nucleic acid amplification test was developed and its performance characteristics determined by [id number]. Nucleic acid amplification tests include PCR and TMA. This test has not been FDA cleared or approved. This test has been authorized by FDA under an Emergency Use Authorization (EUA). This test is only authorized for the duration of time the declaration that circumstances exist justifying the authorization of  the emergency use of in vitro diagnostic tests for detection of SARS-CoV-2 virus and/or diagnosis of COVID-19 infection under section 564(b)(1) of the Act, 21 U.S.C. 099IPJ-8(S) (1), unless the authorization is terminated or revoked sooner. When diagnostic testing is negative, the possibility of a false negative result should be considered in the context of a patient's recent exposures and the presence of clinical signs a nd symptoms consistent with COVID-19. An individual without symptoms of COVID- 19 and who is not shedding SARS-CoV-2 virus would expect to have a negative (not detected) result in this assay. Performed At: [facility] [address] [place], [place] [id number] Rush Farmer MD AL:[id number]    [place]  Final    Comment: Performed at [facility], [place] [address]  [place], [place] [address]  Blood Culture (routine x 2)     Status: None (Preliminary result)   Collection Time: 04/28/19  9:40 AM   Specimen: BLOOD  Result Value Ref Range Status   Specimen Description BLOOD RIGHT ANTECUBITAL  Final   Special Requests   Final    BOTTLES DRAWN AEROBIC AND ANAEROBIC Blood Culture results may not be optimal due to an excessive volume of blood received in culture bottles   Culture   Final    NO GROWTH 4 DAYS Performed at [facility], [place] [address] [place], [place] [address]    Report Status PENDING  Incomplete  Blood Culture (routine x 2)     Status: None (Preliminary result)   Collection Time: 04/28/19  3:54 PM   Specimen: BLOOD  Result Value Ref Range Status   Specimen Description BLOOD LEFT ANTECUBITAL  Final   Special Requests   Final    BOTTLES DRAWN AEROBIC AND ANAEROBIC Blood Culture results may not be optimal due to an inadequate volume of blood received in culture bottles   Culture   Final    NO GROWTH 4 DAYS Performed at [facility], [place] [address], [place], [place] [address]    Report Status PENDING  Incomplete      Studies: No results found.  Scheduled Meds: . allopurinol  100 mg Oral Daily  . amLODipine  5 mg Oral Daily  . aspirin EC  81 mg Oral Daily  . carvedilol  12.5 mg Oral BID  . dexamethasone (DECADRON) injection  6 mg Intravenous Q24H  . enoxaparin (LOVENOX) injection  55 mg Subcutaneous Daily  . feeding supplement (ENSURE ENLIVE)  237 mL Oral BID BM  . insulin aspart  0-20 Units Subcutaneous TID WC  . insulin aspart  0-5 Units Subcutaneous QHS  . insulin glargine  16 Units Subcutaneous BID  . linagliptin  5 mg Oral Daily  . multivitamin with minerals  1 tablet Oral Daily  . rosuvastatin  20 mg Oral Daily  . tamsulosin  0.4 mg Oral Daily    Continuous Infusions: . azithromycin 500 mg (05/01/19 1344)  . cefTRIAXone (ROCEPHIN)  IV 2 g (05/01/19 1239)     LOS: 4 days     Annita Brod, MD [facility]  To reach me or the doctor on call, go to: [email or web address] Password [email or web address]  05/02/2019, 11:12 AM

## 2019-05-03 LAB — CULTURE, BLOOD (ROUTINE X 2)
Culture: NO GROWTH
Culture: NO GROWTH

## 2019-05-03 LAB — COMPREHENSIVE METABOLIC PANEL
ALT: 33 U/L (ref 0–44)
AST: 36 U/L (ref 15–41)
Albumin: 3 g/dL — ABNORMAL LOW (ref 3.5–5.0)
Alkaline Phosphatase: 68 U/L (ref 38–126)
Anion gap: 12 (ref 5–15)
BUN: 33 mg/dL — ABNORMAL HIGH (ref 8–23)
CO2: 23 mmol/L (ref 22–32)
Calcium: 8.8 mg/dL — ABNORMAL LOW (ref 8.9–10.3)
Chloride: 100 mmol/L (ref 98–111)
Creatinine, Ser: 1 mg/dL (ref 0.61–1.24)
GFR calc Af Amer: >60 mL/min (ref >60)
GFR calc non Af Amer: >60 mL/min (ref >60)
Glucose, Bld: 305 mg/dL — ABNORMAL HIGH (ref 70–99)
Potassium: 5.8 mmol/L — ABNORMAL HIGH (ref 3.5–5.1)
Sodium: 135 mmol/L (ref 135–145)
Total Bilirubin: 0.8 mg/dL (ref 0.3–1.2)
Total Protein: 9.2 g/dL — ABNORMAL HIGH (ref 6.5–8.1)

## 2019-05-03 LAB — CBC WITH DIFFERENTIAL/PLATELET
Abs Immature Granulocytes: 0.19 10*3/uL — ABNORMAL HIGH (ref 0.00–0.07)
Basophils Absolute: 0 10*3/uL (ref 0.0–0.1)
Basophils Relative: 0 %
Eosinophils Absolute: 0 10*3/uL (ref 0.0–0.5)
Eosinophils Relative: 0 %
HCT: 45.6 % (ref 39.0–52.0)
Hemoglobin: 13.9 g/dL (ref 13.0–17.0)
Immature Granulocytes: 1 %
Lymphocytes Relative: 6 %
Lymphs Abs: 0.8 10*3/uL (ref 0.7–4.0)
MCH: 21 pg — ABNORMAL LOW (ref 26.0–34.0)
MCHC: 30.5 g/dL (ref 30.0–36.0)
MCV: 68.8 fL — ABNORMAL LOW (ref 80.0–100.0)
Monocytes Absolute: 0.8 10*3/uL (ref 0.1–1.0)
Monocytes Relative: 6 %
Neutro Abs: 12.1 10*3/uL — ABNORMAL HIGH (ref 1.7–7.7)
Neutrophils Relative %: 87 %
Platelets: 434 10*3/uL — ABNORMAL HIGH (ref 150–400)
RBC: 6.63 MIL/uL — ABNORMAL HIGH (ref 4.22–5.81)
RDW: 16.5 % — ABNORMAL HIGH (ref 11.5–15.5)
WBC: 14 10*3/uL — ABNORMAL HIGH (ref 4.0–10.5)
nRBC: 0 % (ref 0.0–0.2)

## 2019-05-03 LAB — C-REACTIVE PROTEIN: CRP: 3.8 mg/dL — ABNORMAL HIGH (ref <1.0)

## 2019-05-03 LAB — PHOSPHORUS: Phosphorus: 3.8 mg/dL (ref 2.5–4.6)

## 2019-05-03 LAB — GLUCOSE, CAPILLARY
Glucose-Capillary: 142 mg/dL — ABNORMAL HIGH (ref 70–99)
Glucose-Capillary: 360 mg/dL — ABNORMAL HIGH (ref 70–99)
Glucose-Capillary: 425 mg/dL — ABNORMAL HIGH (ref 70–99)
Glucose-Capillary: 264 mg/dL — ABNORMAL HIGH (ref 70–99)

## 2019-05-03 LAB — FERRITIN: Ferritin: 667 ng/mL — ABNORMAL HIGH (ref 24–336)

## 2019-05-03 LAB — MAGNESIUM: Magnesium: 2.7 mg/dL — ABNORMAL HIGH (ref 1.7–2.4)

## 2019-05-03 LAB — D-DIMER, QUANTITATIVE: D-Dimer, Quant: 2.82 ug/mL-FEU — ABNORMAL HIGH (ref 0.00–0.50)

## 2019-05-03 MED ORDER — INSULIN GLARGINE 100 UNIT/ML ~~LOC~~ SOLN
18.0000 [IU] | Freq: Two times a day (BID) | SUBCUTANEOUS | Status: DC
  Administered 2019-05-03 – 2019-05-07 (×8): 18 [IU] via SUBCUTANEOUS
  Filled 2019-05-03 (×9): qty 0.18

## 2019-05-03 NOTE — Progress Notes (Signed)
Notified Father for updates with no success. Was able to reach sister Valaria Good. Questions and concerns were addressed. No other issues at this time.

## 2019-05-03 NOTE — TOC Initial Note (Signed)
Transition of Care [facility]) - Initial/Assessment Note    Patient Details  Name: Daniel Perkins MRN: [id number] Date of Birth: [birth year]  Transition of Care [facility]) CM/SW Contact:    Ninfa Meeker, RN Phone Number: 05/03/2019, 12:40 PM  Clinical Narrative: Patient is a 63 yr old male admitted with acute respiratory failure with hypoxia/pneumonia due to COVID-19 virus. Last dose of Remdesivir was 05/02/19. On Room air.  Also has uncontrolled diabetes mellitus with complications and hyperglycemia. Case manager spoke with patient via telephone to discuss discharge needs. He states he lives with his dad, who is a patient at [facility] presently. Patient said that his brother and sister are trying to determine how to get the home sanitized prior to their return home. Case manager offered choice for [place] was called to Adela Lank, [facility] Liaison. Case manager also contacted Rhea Pink, SW at [facility] to inform her of [facility] agency so that patient's dad would be able to receive services from same after he discharges from SNF. CM will continue to monitor for needs.            Barriers to Discharge: Continued Medical Work up   Patient Goals and CMS Choice        Expected Discharge Plan and Services                                                Prior Living Arrangements/Services                       Activities of Daily Living Home Assistive Devices/Equipment: None ADL Screening (condition at time of admission) Patient's cognitive ability adequate to safely complete daily activities?: Yes Is the patient deaf or have difficulty hearing?: No Does the patient have difficulty seeing, even when wearing glasses/contacts?: No Does the patient have difficulty concentrating, remembering, or making decisions?: No Patient able to express need for assistance with ADLs?: Yes Does the patient have difficulty dressing or bathing?:  No Independently performs ADLs?: Yes (appropriate for developmental age) Does the patient have difficulty walking or climbing stairs?: No Weakness of Legs: Both Weakness of Arms/Hands: None  Permission Sought/Granted                  Emotional Assessment              Admission diagnosis:  Acute respiratory failure with hypoxia ([place]) [J96.01] Pneumonia due to COVID-19 virus [U07.1, J12.89] Acute respiratory disease due to COVID-19 virus [U07.1, J06.9] Patient Active Problem List   Diagnosis Date Noted  . CKD (chronic kidney disease), 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  . Hepatic steatosis 05/01/2019  . Acute respiratory disease due to COVID-19 virus 04/28/2019  . Prostate hypertrophy 12/06/2018  . Ingrown toenail 06/06/2018  . Rotator cuff tendonitis, left 02/14/2017  . Toe cyanosis 02/14/2017  . Skin lesion 03/23/2016  . Pulsatile mass of left upper extremity 06/19/2015  . Sleep apnea-like behavior 10/01/2014  . Chest pain 09/24/2014  . Peripheral vascular disease ([place]) 02/10/2014  . Microcytic anemia 02/06/2013  . CTEV (congenital talipes equinovarus)   . Statin intolerance 06/29/2012  . Esophageal reflux 06/29/2012  . Health care maintenance 06/28/2012  . Gout 06/12/2012  . Obesity, Class II, BMI 35-39.9 02/20/2012  . DOE (dyspnea on exertion) 02/20/2012  . SOB (shortness of breath) 08/18/2010  .  Uncontrolled diabetes mellitus with complications (HCC) 09/06/2009  . ERECTILE DYSFUNCTION, NON-ORGANIC 02/16/2009  . Hyperlipemia 01/14/2009  . HYPERTENSION 01/14/2009   PCP:  Eliezer Bottom, MD Pharmacy:   [facility] [address], [place] - [address] [place] [address] Phone: [phone] Fax: [phone]  [facility] - [place], [place] - [address] [place] [address] [place] [address] Phone: [phone] Fax: [phone]     Social Determinants of Health (SDOH) Interventions     Readmission Risk Interventions No flowsheet data found.

## 2019-05-03 NOTE — Progress Notes (Signed)
PROGRESS NOTE  Woodruff Skirvin ZJI:[id number] DOB: 09/24/1955 DOA: 04/28/2019 PCP: Eliezer Bottom, MD  HPI/Recap of past 24 hours: 63yo with a history of DM, CAD, HTN, HLD, and bilateral clubfoot who originally presented to an urgent care center 12/16 with fatigue, anorexia, and loss of taste and was ultimately found to be Covid positive. Since that time he has developed increasing shortness of breath with refractory cough. He presented to the [facility] ED 12/20 where a CXR noted bilateral pulmonary infiltrates and he required 10 L high flow nasal cannula oxygen to keep his saturations in the mid 90s & transferred to [facility].  Since admission, he at first improved after being started on Remdisivir, Decadron plus IV antibiotics.  Initially he required less oxygen, but by evening of 12/22, needed to increase his high flow oxygen up to 15 L.  Have slowly been weaning again, and impressively, by this morning, patient is on room air breathing at 92% and comfortable.  He has no complaints.  Still having some productive cough with bloody sputum.  Assessment/Plan: Principal Problem:   Acute respiratory failure with hypoxia/pneumonia due to COVID-19 virus: Continue IV antibiotics as procalcitonin level still elevated.  CRP and ferritin both trending downward.  He has finished remdesivir and continued on IV steroids.  Amazingly, he is now on room air.  Will start ambulating and checking an ambulatory pulse ox.  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  CBGs still somewhat elevated, so we will increase Lantus to 18 units twice a day  Hepatic steatosis: Incidentally noted on CT scan of chest.  Outpatient follow-up.  Essential HYPERTENSION: Blood pressure remaining stable.,  Continue home medications.    Obesity, Class II, BMI 35-39.9: Patient meets criteria for BMI greater than 35.    Prostate hypertrophy: Stable,  continue home medications.  Stage II chronic kidney disease: Remains stable at baseline.  Leukocytosis: Presumed from steroids  Code Status: Full code  Family Communication: Tried to call sister by phone, unable to leave voicemail.  Disposition Plan: Potential discharge tomorrow with home health if he is able to maintain off of room air   Consultants:  None  Procedures:  None  Antimicrobials:  IV Rocephin and Zithromax 12/20-present  IV Remdisivir 12/20-12/24  DVT prophylaxis: Lovenox   Objective: Vitals:   05/03/19 0817 05/03/19 1116  BP: (!) 168/79 105/69  Pulse: 81   Resp: 16 20  Temp: 98.1 F (36.7 C) 98.2 F (36.8 C)  SpO2: 95%     Intake/Output Summary (Last 24 hours) at 05/03/2019 1327 Last data filed at 05/03/2019 1238 Gross per 24 hour  Intake 960 ml  Output 1300 ml  Net -340 ml   Filed Weights   04/28/19 0919  Weight: 108.9 kg   Body mass index is 36.49 kg/m.  Exam:   General: Alert and oriented x3, NAD  HEENT: Normocephalic and atraumatic, mucous membranes are moist  Neck: Supple, no JVD  Cardiovascular: Regular rate and rhythm, S1-S2  Respiratory: Clear to auscultation bilaterally  Abdomen: Soft, nontender, nondistended, positive bowel sounds  Musculoskeletal: No clubbing or cyanosis, trace pitting edema  Skin: No skin breaks, tears or lesions  Neuro: No focal deficits  Psychiatry: Appropriate, no evidence of psychoses   Data Reviewed: CBC: Recent Labs  Lab 04/28/19 0939 04/29/19 0234 04/30/19 0415 05/01/19 0120 05/02/19 0300  WBC 8.2 8.7 13.2* 15.5* 14.0*  NEUTROABS 6.0 6.3 11.0* 13.0* 11.2*  HGB 13.5 12.6* 12.7*  12.5* 12.8*  HCT 42.0 39.9 40.6 40.7 41.5  MCV 67.3* 68.7* 68.0* 67.5* 67.8*  PLT 295 266 321 386 422*   Basic Metabolic Panel: Recent Labs  Lab 04/28/19 0939 04/29/19 0234 04/30/19 0415 05/01/19 0120 05/02/19 0300  NA 131* 133* 137 137 139  K 4.0 4.6 4.3 4.5 4.6  CL 100 100 104 105 107   CO2 19* 21* 21* 22 22  GLUCOSE 265* 185* 164* 201* 182*  BUN 15 27* 41* 42* 33*  CREATININE 1.28* 1.28* 1.23 1.20 1.00  CALCIUM 8.4* 8.5* 8.8* 8.7* 8.8*  MG  --  2.4 2.6* 2.8* 2.7*  PHOS  --  2.5 4.2 3.6 3.2   GFR: Estimated Creatinine Clearance: 91.7 mL/min (by C-G formula based on SCr of 1 mg/dL). Liver Function Tests: Recent Labs  Lab 04/28/19 0939 04/29/19 0234 04/30/19 0415 05/01/19 0120 05/02/19 0300  AST 60* 50* 42* 34 28  ALT 34 33 30 32 32  ALKPHOS 66 58 58 65 63  BILITOT 0.5 0.2* 0.3 0.5 0.4  PROT 8.4* 8.3* 8.2* 8.2* 8.1  ALBUMIN 2.8* 2.7* 2.7* 2.6* 2.5*   No results for input(s): LIPASE, AMYLASE in the last 168 hours. No results for input(s): AMMONIA in the last 168 hours. Coagulation Profile: No results for input(s): INR, PROTIME in the last 168 hours. Cardiac Enzymes: No results for input(s): CKTOTAL, CKMB, CKMBINDEX, TROPONINI in the last 168 hours. BNP (last 3 results) No results for input(s): PROBNP in the last 8760 hours. HbA1C: No results for input(s): HGBA1C in the last 72 hours. CBG: Recent Labs  Lab 05/01/19 1953 05/02/19 0742 05/02/19 2104 05/03/19 0818 05/03/19 1059  GLUCAP 316* 143* 288* 142* 360*   Lipid Profile: No results for input(s): CHOL, HDL, LDLCALC, TRIG, CHOLHDL, LDLDIRECT in the last 72 hours. Thyroid Function Tests: No results for input(s): TSH, T4TOTAL, FREET4, T3FREE, THYROIDAB in the last 72 hours. Anemia Panel: Recent Labs    05/01/19 0120 05/02/19 0300  FERRITIN 687* 662*   Urine analysis:    Component Value Date/Time   COLORURINE YELLOW 07/09/2015 2219   APPEARANCEUR Clear 01/12/2016 1634   LABSPEC 1.015 07/09/2015 2219   PHURINE 7.0 07/09/2015 2219   GLUCOSEU 3+ (A) 01/12/2016 1634   HGBUR NEGATIVE 07/09/2015 2219   BILIRUBINUR negative 12/06/2018 1659   BILIRUBINUR Negative 01/12/2016 1634   KETONESUR NEGATIVE 07/09/2015 2219   PROTEINUR Negative 12/06/2018 1659   PROTEINUR Negative 01/12/2016 1634    PROTEINUR NEGATIVE 07/09/2015 2219   UROBILINOGEN 2.0 (A) 12/06/2018 1659   UROBILINOGEN 1 10/23/2013 1555   NITRITE negative 12/06/2018 1659   NITRITE Negative 01/12/2016 1634   NITRITE NEGATIVE 07/09/2015 2219   LEUKOCYTESUR Negative 12/06/2018 1659   LEUKOCYTESUR Negative 01/12/2016 1634   Sepsis Labs: @LABRCNTIP (procalcitonin:4,lacticidven:4)  ) Recent Results (from the past 240 hour(s))  Novel Coronavirus, NAA (Hosp order, Send-out to Ref Lab; TAT 18-24 hrs     Status: Abnormal   Collection Time: 04/24/19  8:54 PM   Specimen: Nasopharyngeal Swab; Respiratory  Result Value Ref Range Status   SARS-CoV-2, NAA DETECTED (A) NOT DETECTED Final    Comment: RESULT CALLED TO, READ BACK BY AND VERIFIED WITH: RN L BERDICK [id number] (NOTE)                  Client Requested Flag This nucleic acid amplification test was developed and its performance characteristics determined by [facility]. Nucleic acid amplification tests include PCR and TMA. This test has not been FDA  cleared or approved. This test has been authorized by FDA under an Emergency Use Authorization (EUA). This test is only authorized for the duration of time the declaration that circumstances exist justifying the authorization of the emergency use of in vitro diagnostic tests for detection of SARS-CoV-2 virus and/or diagnosis of COVID-19 infection under section 564(b)(1) of the Act, 21 U.S.C. 161WRU-0(A) (1), unless the authorization is terminated or revoked sooner. When diagnostic testing is negative, the possibility of a false negative result should be considered in the context of a patient's recent exposures and the presence of clinical signs a nd symptoms consistent with COVID-19. An individual without symptoms of COVID- 19 and who is not shedding SARS-CoV-2 virus would expect to have a negative (not detected) result in this assay. Performed At: [facility] [address] [place], [place]  [id number] Rush Farmer MD YN:[id number]    [place]  Final    Comment: Performed at [facility], [place] [address] [place], [place] [address]  Blood Culture (routine x 2)     Status: None   Collection Time: 04/28/19  9:40 AM   Specimen: BLOOD  Result Value Ref Range Status   Specimen Description BLOOD RIGHT ANTECUBITAL  Final   Special Requests   Final    BOTTLES DRAWN AEROBIC AND ANAEROBIC Blood Culture results may not be optimal due to an excessive volume of blood received in culture bottles   Culture   Final    NO GROWTH 5 DAYS Performed at [facility], [place] [address], [place], [place] [address]    Report Status 05/03/2019 FINAL  Final  Blood Culture (routine x 2)     Status: None   Collection Time: 04/28/19  3:54 PM   Specimen: BLOOD  Result Value Ref Range Status   Specimen Description BLOOD LEFT ANTECUBITAL  Final   Special Requests   Final    BOTTLES DRAWN AEROBIC AND ANAEROBIC Blood Culture results may not be optimal due to an inadequate volume of blood received in culture bottles   Culture   Final    NO GROWTH 5 DAYS Performed at [facility], [place] [address] [place], [place] [address]    Report Status 05/03/2019 FINAL  Final      Studies: No results found.  Scheduled Meds: . allopurinol  100 mg Oral Daily  . amLODipine  5 mg Oral Daily  . aspirin EC  81 mg Oral Daily  . carvedilol  12.5 mg Oral BID  . dexamethasone (DECADRON) injection  6 mg Intravenous Q24H  . enoxaparin (LOVENOX) injection  55 mg Subcutaneous Daily  . feeding supplement (ENSURE ENLIVE)  237 mL Oral BID BM  . insulin aspart  0-20 Units Subcutaneous TID WC  . insulin aspart  0-5 Units Subcutaneous QHS  . insulin glargine  16 Units Subcutaneous BID  . linagliptin  5 mg Oral Daily  . multivitamin with minerals  1 tablet Oral Daily  . rosuvastatin  20 mg Oral Daily  . tamsulosin  0.4 mg Oral Daily    Continuous Infusions:    LOS: 5 days      Annita Brod, MD [facility]  To reach me or the doctor on call, go to: [email or web address] Password [email or web address]  05/03/2019, 1:27 PM

## 2019-05-04 LAB — HEPATIC FUNCTION PANEL
ALT: 31 U/L (ref 0–44)
AST: 27 U/L (ref 15–41)
Albumin: 2.5 g/dL — ABNORMAL LOW (ref 3.5–5.0)
Alkaline Phosphatase: 63 U/L (ref 38–126)
Bilirubin, Direct: <0.1 mg/dL (ref 0.0–0.2)
Total Bilirubin: 0.4 mg/dL (ref 0.3–1.2)
Total Protein: 8.1 g/dL (ref 6.5–8.1)

## 2019-05-04 LAB — BASIC METABOLIC PANEL
Anion gap: 10 (ref 5–15)
BUN: 27 mg/dL — ABNORMAL HIGH (ref 8–23)
CO2: 24 mmol/L (ref 22–32)
Calcium: 9 mg/dL (ref 8.9–10.3)
Chloride: 102 mmol/L (ref 98–111)
Creatinine, Ser: 1.02 mg/dL (ref 0.61–1.24)
GFR calc Af Amer: >60 mL/min (ref >60)
GFR calc non Af Amer: >60 mL/min (ref >60)
Glucose, Bld: 151 mg/dL — ABNORMAL HIGH (ref 70–99)
Potassium: 5.3 mmol/L — ABNORMAL HIGH (ref 3.5–5.1)
Sodium: 136 mmol/L (ref 135–145)

## 2019-05-04 LAB — CBC
HCT: 43.5 % (ref 39.0–52.0)
Hemoglobin: 13.5 g/dL (ref 13.0–17.0)
MCH: 21 pg — ABNORMAL LOW (ref 26.0–34.0)
MCHC: 31 g/dL (ref 30.0–36.0)
MCV: 67.8 fL — ABNORMAL LOW (ref 80.0–100.0)
Platelets: 468 10*3/uL — ABNORMAL HIGH (ref 150–400)
RBC: 6.42 MIL/uL — ABNORMAL HIGH (ref 4.22–5.81)
RDW: 15.2 % (ref 11.5–15.5)
WBC: 17.3 10*3/uL — ABNORMAL HIGH (ref 4.0–10.5)
nRBC: 0 % (ref 0.0–0.2)

## 2019-05-04 LAB — GLUCOSE, CAPILLARY
Glucose-Capillary: 195 mg/dL — ABNORMAL HIGH (ref 70–99)
Glucose-Capillary: 371 mg/dL — ABNORMAL HIGH (ref 70–99)
Glucose-Capillary: 473 mg/dL — ABNORMAL HIGH (ref 70–99)

## 2019-05-04 LAB — FERRITIN: Ferritin: 636 ng/mL — ABNORMAL HIGH (ref 24–336)

## 2019-05-04 LAB — C-REACTIVE PROTEIN: CRP: 2.9 mg/dL — ABNORMAL HIGH (ref <1.0)

## 2019-05-04 LAB — D-DIMER, QUANTITATIVE: D-Dimer, Quant: 2.26 ug/mL-FEU — ABNORMAL HIGH (ref 0.00–0.50)

## 2019-05-04 MED ORDER — GLUCERNA SHAKE PO LIQD
237.0000 mL | Freq: Three times a day (TID) | ORAL | Status: DC
  Administered 2019-05-05 – 2019-05-06 (×4): 237 mL via ORAL
  Filled 2019-05-04 (×4): qty 237

## 2019-05-04 MED ORDER — INSULIN ASPART 100 UNIT/ML ~~LOC~~ SOLN
30.0000 [IU] | Freq: Once | SUBCUTANEOUS | Status: AC
  Administered 2019-05-04: 30 [IU] via SUBCUTANEOUS

## 2019-05-04 NOTE — Progress Notes (Signed)
Sats were 86% this morning during rounds. Pt put on 2L , sats currently at 90%, no other issues at this time.

## 2019-05-04 NOTE — TOC Progression Note (Addendum)
Transition of Care [facility]) - Progression Note    Patient Details  Name: Daniel Perkins MRN: [id number] Date of Birth: 03-30-1956  Transition of Care [facility]) CM/SW Contact  Shade Flood, LCSW Phone Number: 05/04/2019, 3:37 PM  Clinical Narrative:    TOC following. Discussed pt status with MD. MD anticipating dc home on 12/28 with Home O2 and HH PT. HH already arranged with [facility]. Initiated Home O2 arrangements with [facility]on [facility]) today. Covering TOC can follow up tomorrow or Monday with faxing of clinical and O2 orders and informing of dc date. Fax number is [phone]).  Covering TOC will follow.   Expected Discharge Plan: Day Barriers to Discharge: Continued Medical Work up  Expected Discharge Plan and Services Expected Discharge Plan: [place] Choice: Durable Medical Equipment, Home Health                   DME Arranged: Oxygen DME Agency: [facility] Date DME Agency Contacted: 05/04/19 Time DME Agency Contacted: [phone] Representative spoke with at DME Agency: Kipp Laurence Arranged: PT [place]: [place] Date [place]: 05/03/19   Representative spoke with at [place]: [place]) Interventions    Readmission Risk Interventions No flowsheet data found.

## 2019-05-04 NOTE — Progress Notes (Signed)
PROGRESS NOTE  Daniel Perkins [id number]:[id number] DOB: 08-07-1955 DOA: 04/28/2019 PCP: Eliezer Bottom, MD  HPI/Recap of past 24 hours: 63yo with a history of DM, CAD, HTN, HLD, and bilateral clubfoot who originally presented to an urgent care center 12/16 with fatigue, anorexia, and loss of taste and was ultimately found to be Covid positive. Since that time he has developed increasing shortness of breath with refractory cough. He presented to the [facility] ED 12/20 where a CXR noted bilateral pulmonary infiltrates and he required 10 L high flow nasal cannula oxygen to keep his saturations in the mid 90s & transferred to [facility].  Since admission, he at first improved after being started on Remdisivir, Decadron plus IV antibiotics.  Initially he required less oxygen, but by evening of 12/22, needed to increase his high flow oxygen up to 15 L.  Have slowly been weaning again, and by 12/25 morning, down to room air.  Try to be active and may have overdone it, requiring 2 L x 12/26 although he is quite comfortable with no complaints.  Assessment/Plan: Principal Problem:   Acute respiratory failure with hypoxia/pneumonia due to COVID-19 virus: Continue IV antibiotics as procalcitonin level still elevated.  CRP and ferritin both trending downward.  He has finished remdesivir and continued on IV steroids.  Able to be weaned down to room air by 12/25.  However, he seems to have overdid it and requiring 2 L today, especially with activity.  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  CBGs still somewhat elevated, so increased Lantus to 18 units twice a day as of 12/25 night.  Still having some persistently elevated CBGs, but noted that he is on Ensure so have changed this to Glucerna.  If his CRP improves further tomorrow and he is off oxygen, could potentially start to wean his steroids by  then  Hepatic steatosis: Incidentally noted on CT scan of chest.  Outpatient follow-up.  Essential HYPERTENSION: Blood pressure remaining stable.,  Continue home medications.    Obesity, Class II, BMI 35-39.9: Patient meets criteria for BMI greater than 35.    Prostate hypertrophy: Stable, continue home medications.  Stage II chronic kidney disease: Remains stable at baseline.  Leukocytosis: Presumed from steroids  Code Status: Full code  Family Communication: Updated sister by phone  Disposition Plan: Anticipate discharge on Monday with home health.   Consultants:  None  Procedures:  None  Antimicrobials:  IV Rocephin and Zithromax 12/20-present  IV Remdisivir 12/20-12/24  DVT prophylaxis: Lovenox   Objective: Vitals:   05/04/19 0513 05/04/19 0800  BP:  98/66  Pulse:  87  Resp:  17  Temp:  97.8 F (36.6 C)  SpO2: 90% 92%    Intake/Output Summary (Last 24 hours) at 05/04/2019 1642 Last data filed at 05/04/2019 1453 Gross per 24 hour  Intake 800 ml  Output 580 ml  Net 220 ml   Filed Weights   04/28/19 0919  Weight: 108.9 kg   Body mass index is 36.49 kg/m.  Exam:   General: Alert and oriented x3, NAD  HEENT: Normocephalic and atraumatic, mucous membranes are moist  Neck: Supple, no JVD  Cardiovascular: Regular rate and rhythm, S1-S2  Respiratory: Clear to auscultation bilaterally  Abdomen: Soft, nontender, nondistended, positive bowel sounds  Musculoskeletal: No clubbing or cyanosis, trace pitting edema  Skin: No skin breaks, tears or lesions  Neuro: No focal deficits  Psychiatry: Appropriate, no evidence of  psychoses   Data Reviewed: CBC: Recent Labs  Lab 04/29/19 0234 04/30/19 0415 05/01/19 0120 05/02/19 0300 05/03/19 1127 05/04/19 0720  WBC 8.7 13.2* 15.5* 14.0* 14.0* 17.3*  NEUTROABS 6.3 11.0* 13.0* 11.2* 12.1*  --   HGB 12.6* 12.7* 12.5* 12.8* 13.9 13.5  HCT 39.9 40.6 40.7 41.5 45.6 43.5  MCV 68.7* 68.0* 67.5*  67.8* 68.8* 67.8*  PLT 266 321 386 422* 434* 468*   Basic Metabolic Panel: Recent Labs  Lab 04/29/19 0234 04/30/19 0415 05/01/19 0120 05/02/19 0300 05/03/19 1127 05/04/19 0720  NA 133* 137 137 139 135 136  K 4.6 4.3 4.5 4.6 5.8* 5.3*  CL 100 104 105 107 100 102  CO2 21* 21* GLUCOSE 185* 164* 201* 182* 305* 151*  BUN 27* 41* 42* 33* 33* 27*  CREATININE 1.28* 1.23 1.20 1.00 1.00 1.02  CALCIUM 8.5* 8.8* 8.7* 8.8* 8.8* 9.0  MG 2.4 2.6* 2.8* 2.7* 2.7*  --   PHOS 2.5 4.2 3.6 3.2 3.8  --    GFR: Estimated Creatinine Clearance: 89.9 mL/min (by C-G formula based on SCr of 1.02 mg/dL). Liver Function Tests: Recent Labs  Lab 04/30/19 0415 05/01/19 0120 05/02/19 0300 05/03/19 1127 05/04/19 0720  AST 42* 34 28 36 27  ALT 30 32 32 33 31  ALKPHOS 58 65 63 68 63  BILITOT 0.3 0.5 0.4 0.8 0.4  PROT 8.2* 8.2* 8.1 9.2* 8.1  ALBUMIN 2.7* 2.6* 2.5* 3.0* 2.5*   No results for input(s): LIPASE, AMYLASE in the last 168 hours. No results for input(s): AMMONIA in the last 168 hours. Coagulation Profile: No results for input(s): INR, PROTIME in the last 168 hours. Cardiac Enzymes: No results for input(s): CKTOTAL, CKMB, CKMBINDEX, TROPONINI in the last 168 hours. BNP (last 3 results) No results for input(s): PROBNP in the last 8760 hours. HbA1C: No results for input(s): HGBA1C in the last 72 hours. CBG: Recent Labs  Lab 05/03/19 1059 05/03/19 1529 05/03/19 2146 05/04/19 0834 05/04/19 1116  GLUCAP 360* 425* 264* 195* 371*   Lipid Profile: No results for input(s): CHOL, HDL, LDLCALC, TRIG, CHOLHDL, LDLDIRECT in the last 72 hours. Thyroid Function Tests: No results for input(s): TSH, T4TOTAL, FREET4, T3FREE, THYROIDAB in the last 72 hours. Anemia Panel: Recent Labs    05/03/19 1127 05/04/19 0720  FERRITIN 667* 636*   Urine analysis:    Component Value Date/Time   COLORURINE YELLOW 07/09/2015 2219   APPEARANCEUR Clear 01/12/2016 1634   LABSPEC 1.015 07/09/2015  2219   PHURINE 7.0 07/09/2015 2219   GLUCOSEU 3+ (A) 01/12/2016 1634   HGBUR NEGATIVE 07/09/2015 2219   BILIRUBINUR negative 12/06/2018 1659   BILIRUBINUR Negative 01/12/2016 1634   KETONESUR NEGATIVE 07/09/2015 2219   PROTEINUR Negative 12/06/2018 1659   PROTEINUR Negative 01/12/2016 1634   PROTEINUR NEGATIVE 07/09/2015 2219   UROBILINOGEN 2.0 (A) 12/06/2018 1659   UROBILINOGEN 1 10/23/2013 1555   NITRITE negative 12/06/2018 1659   NITRITE Negative 01/12/2016 1634   NITRITE NEGATIVE 07/09/2015 2219   LEUKOCYTESUR Negative 12/06/2018 1659   LEUKOCYTESUR Negative 01/12/2016 1634   Sepsis Labs: (procalcitonin:4,lacticidven:4)  ) Recent Results (from the past 240 hour(s))  Novel Coronavirus, NAA (Hosp order, Send-out to Ref Lab; TAT 18-24 hrs     Status: Abnormal   Collection Time: 04/24/19  8:54 PM   Specimen: Nasopharyngeal Swab; Respiratory  Result Value Ref Range Status   SARS-CoV-2, NAA DETECTED (A) NOT DETECTED Final    Comment: RESULT CALLED TO, READ  BACK BY AND VERIFIED WITH: RN L BERDICK [phone] MLM (NOTE)                  Client Requested Flag This nucleic acid amplification test was developed and its performance characteristics determined by [facility]. Nucleic acid amplification tests include PCR and TMA. This test has not been FDA cleared or approved. This test has been authorized by FDA under an Emergency Use Authorization (EUA). This test is only authorized for the duration of time the declaration that circumstances exist justifying the authorization of the emergency use of in vitro diagnostic tests for detection of SARS-CoV-2 virus and/or diagnosis of COVID-19 infection under section 564(b)(1) of the Act, 21 U.S.C. 409WJX-9(J360bbb-3(b) (1), unless the authorization is terminated or revoked sooner. When diagnostic testing is negative, the possibility of a false negative result should be considered in the context of a patient's recent exposures  and the presence of clinical signs a nd symptoms consistent with COVID-19. An individual without symptoms of COVID- 19 and who is not shedding SARS-CoV-2 virus would expect to have a negative (not detected) result in this assay. Performed At: [facility] [address] [place], [place] [id number] Jolene SchimkeNagendra Sanjai MD HY:[phone]    Coronavirus Source NASOPHARYNGEAL  Final    Comment: Performed at [facility], [address] [place], [place] [address]  Blood Culture (routine x 2)     Status: None   Collection Time: 04/28/19  9:40 AM   Specimen: BLOOD  Result Value Ref Range Status   Specimen Description BLOOD RIGHT ANTECUBITAL  Final   Special Requests   Final    BOTTLES DRAWN AEROBIC AND ANAEROBIC Blood Culture results may not be optimal due to an excessive volume of blood received in culture bottles   Culture   Final    NO GROWTH 5 DAYS Performed at [facility], [address] [place], [place] [address]    Report Status 05/03/2019 FINAL  Final  Blood Culture (routine x 2)     Status: None   Collection Time: 04/28/19  3:54 PM   Specimen: BLOOD  Result Value Ref Range Status   Specimen Description BLOOD LEFT ANTECUBITAL  Final   Special Requests   Final    BOTTLES DRAWN AEROBIC AND ANAEROBIC Blood Culture results may not be optimal due to an inadequate volume of blood received in culture bottles   Culture   Final    NO GROWTH 5 DAYS Performed at [facility], [address] [place], [place] [address]    Report Status 05/03/2019 FINAL  Final      Studies: No results found.  Scheduled Meds: . allopurinol  100 mg Oral Daily  . amLODipine  5 mg Oral Daily  . aspirin EC  81 mg Oral Daily  . carvedilol  12.5 mg Oral BID  . dexamethasone (DECADRON) injection  6 mg Intravenous Q24H  . enoxaparin (LOVENOX) injection  55 mg Subcutaneous Daily  . feeding supplement (ENSURE ENLIVE)  237 mL Oral BID BM  . insulin aspart  0-20 Units Subcutaneous TID WC   . insulin aspart  0-5 Units Subcutaneous QHS  . insulin glargine  18 Units Subcutaneous BID  . linagliptin  5 mg Oral Daily  . multivitamin with minerals  1 tablet Oral Daily  . rosuvastatin  20 mg Oral Daily  . tamsulosin  0.4 mg Oral Daily    Continuous Infusions:    LOS: 6 days  Annita Brod, MD [facility]  To reach me or the doctor on call, go to: [email or web address] Password [email or web address]  05/04/2019, 4:42 PM

## 2019-05-05 ENCOUNTER — Inpatient Hospital Stay (HOSPITAL_COMMUNITY): Payer: Medicare Other

## 2019-05-05 LAB — URINALYSIS, ROUTINE W REFLEX MICROSCOPIC
Bacteria, UA: NONE SEEN
Bilirubin Urine: NEGATIVE
Glucose, UA: >=500 mg/dL — AB
Hgb urine dipstick: NEGATIVE
Ketones, ur: NEGATIVE mg/dL
Leukocytes,Ua: NEGATIVE
Nitrite: NEGATIVE
Protein, ur: NEGATIVE mg/dL
Specific Gravity, Urine: 1.021 (ref 1.005–1.030)
pH: 5 (ref 5.0–8.0)

## 2019-05-05 LAB — BASIC METABOLIC PANEL
Anion gap: 8 (ref 5–15)
BUN: 37 mg/dL — ABNORMAL HIGH (ref 8–23)
CO2: 23 mmol/L (ref 22–32)
Calcium: 9 mg/dL (ref 8.9–10.3)
Chloride: 101 mmol/L (ref 98–111)
Creatinine, Ser: 1.06 mg/dL (ref 0.61–1.24)
GFR calc Af Amer: >60 mL/min (ref >60)
GFR calc non Af Amer: >60 mL/min (ref >60)
Glucose, Bld: 198 mg/dL — ABNORMAL HIGH (ref 70–99)
Potassium: 5.2 mmol/L — ABNORMAL HIGH (ref 3.5–5.1)
Sodium: 132 mmol/L — ABNORMAL LOW (ref 135–145)

## 2019-05-05 LAB — CBC
HCT: 42.6 % (ref 39.0–52.0)
Hemoglobin: 13 g/dL (ref 13.0–17.0)
MCH: 20.7 pg — ABNORMAL LOW (ref 26.0–34.0)
MCHC: 30.5 g/dL (ref 30.0–36.0)
MCV: 67.7 fL — ABNORMAL LOW (ref 80.0–100.0)
Platelets: 465 10*3/uL — ABNORMAL HIGH (ref 150–400)
RBC: 6.29 MIL/uL — ABNORMAL HIGH (ref 4.22–5.81)
RDW: 15.2 % (ref 11.5–15.5)
WBC: 21.2 10*3/uL — ABNORMAL HIGH (ref 4.0–10.5)
nRBC: 0 % (ref 0.0–0.2)

## 2019-05-05 LAB — GLUCOSE, CAPILLARY
Glucose-Capillary: 157 mg/dL — ABNORMAL HIGH (ref 70–99)
Glucose-Capillary: 326 mg/dL — ABNORMAL HIGH (ref 70–99)
Glucose-Capillary: 369 mg/dL — ABNORMAL HIGH (ref 70–99)
Glucose-Capillary: 296 mg/dL — ABNORMAL HIGH (ref 70–99)

## 2019-05-05 LAB — FERRITIN: Ferritin: 587 ng/mL — ABNORMAL HIGH (ref 24–336)

## 2019-05-05 LAB — D-DIMER, QUANTITATIVE: D-Dimer, Quant: 1.8 ug/mL-FEU — ABNORMAL HIGH (ref 0.00–0.50)

## 2019-05-05 LAB — C-REACTIVE PROTEIN: CRP: 1.9 mg/dL — ABNORMAL HIGH (ref <1.0)

## 2019-05-05 LAB — PROCALCITONIN: Procalcitonin: <0.1 ng/mL

## 2019-05-05 MED ORDER — LIP MEDEX EX OINT
TOPICAL_OINTMENT | CUTANEOUS | Status: DC | PRN
  Administered 2019-05-05: 1 via TOPICAL
  Filled 2019-05-05: qty 7

## 2019-05-05 NOTE — Progress Notes (Addendum)
PROGRESS NOTE  Daniel Perkins DQQ:[id number] DOB: October 19, 1955 DOA: 04/28/2019 PCP: Eliezer Bottom, MD  HPI/Recap of past 24 hours: 63yo with a history of DM, CAD, HTN, HLD, and bilateral clubfoot who originally presented to an urgent care center 12/16 with fatigue, anorexia, and loss of taste and was ultimately found to be Covid positive. Since that time he has developed increasing shortness of breath with refractory cough. He presented to the [facility] ED 12/20 where a CXR noted bilateral pulmonary infiltrates and he required 10 L high flow nasal cannula oxygen to keep his saturations in the mid 90s & transferred to [facility].  Since admission, he at first improved after being started on Remdisivir, Decadron plus IV antibiotics.  Initially he required less oxygen, but by evening of 12/22, needed to increase his high flow oxygen up to 15 L.  Have slowly been weaning again, and by 12/25 morning, down to room air.  Try to be active and may have overdone it, requiring 2 L x 12/26.  Overnight, required briefly more oxygen and currently down to 3 L.  Had also noted over the last few days that his blood sugars at times have trended upwards into the 300's-400's.  Patient himself with no complaints  Leukocytosis: Likely secondary to steroids.  Continues to trend upward although no signs of infection.  No fever.  Procalcitonin level normal.  Nevertheless, with mildly increasing hypoxia, will continue to follow.  Check lactic acid level in the morning.  Will check urinalysis and chest x-ray.  D-dimer has been stable and continues to decrease.  If hypoxia persists or worsens, will check lower extremity Doppler to rule out DVT  Assessment/Plan: Principal Problem:   Acute respiratory failure with hypoxia/pneumonia due to COVID-19 virus: Continue IV antibiotics as procalcitonin level still elevated.  He has finished remdesivir and continued on IV steroids.  Able to be weaned down to room air by 12/25.  Oxygenation  needs slightly increased over the last few days may be more secondary to increased activity.  Encouraged continued and spirometer use and physical therapy.  Procalcitonin level normalized with ferritin and CRP continues to trend downward.  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  CBGs still somewhat elevated, so increased Lantus to 18 units twice a day as of 12/25 night.  Still having some persistently elevated CBGs, but noted that he is on Ensure so have changed this to Glucerna.  However, he still continues to get a shake on his tray in addition to the Glucerna that appears to be a contributing factor.  Advised the patient not to take this.  Hepatic steatosis: Incidentally noted on CT scan of chest.  Outpatient follow-up.  Essential HYPERTENSION: Blood pressure remaining stable.,  Continue home medications.    Obesity, Class II, BMI 35-39.9: Patient meets criteria for BMI greater than 35.    Prostate hypertrophy: Stable, continue home medications.  Stage II chronic kidney disease: Remains stable at baseline.  Leukocytosis: Presumed from steroids  Code Status: Full code  Family Communication: Updated sister by phone  Disposition Plan: Anticipate discharge on Tuesday or Wednesday once he is better oxygenated   Consultants:  None  Procedures:  None  Antimicrobials:  IV Rocephin and Zithromax 12/20-present  IV Remdisivir 12/20-12/24  DVT prophylaxis: Lovenox   Objective: Vitals:   05/05/19 0439 05/05/19 0755  BP: 117/72 116/67  Pulse: 71 70  Resp: 18 16  Temp: 98 F (36.7 C) 97.9 F (  36.6 C)  SpO2: 97% 95%    Intake/Output Summary (Last 24 hours) at 05/05/2019 1655 Last data filed at 05/05/2019 1639 Gross per 24 hour  Intake --  Output 1600 ml  Net -1600 ml   Filed Weights   04/28/19 0919  Weight: 108.9 kg   Body mass index is 36.49 kg/m.  Exam:   General:  Alert and oriented x3, NAD  HEENT: Normocephalic and atraumatic, mucous membranes are moist  Neck: Supple, no JVD  Cardiovascular: Regular rate and rhythm, S1-S2  Respiratory: Clear to auscultation bilaterally  Abdomen: Soft, nontender, nondistended, positive bowel sounds  Musculoskeletal: No clubbing or cyanosis, trace pitting edema  Skin: No skin breaks, tears or lesions  Neuro: No focal deficits  Psychiatry: Appropriate, no evidence of psychoses   Data Reviewed: CBC: Recent Labs  Lab 04/29/19 0234 04/30/19 0415 05/01/19 0120 05/02/19 0300 05/03/19 1127 05/04/19 0720 05/05/19 0043  WBC 8.7 13.2* 15.5* 14.0* 14.0* 17.3* 21.2*  NEUTROABS 6.3 11.0* 13.0* 11.2* 12.1*  --   --   HGB 12.6* 12.7* 12.5* 12.8* 13.9 13.5 13.0  HCT 39.9 40.6 40.7 41.5 45.6 43.5 42.6  MCV 68.7* 68.0* 67.5* 67.8* 68.8* 67.8* 67.7*  PLT 266 321 386 422* 434* 468* 465*   Basic Metabolic Panel: Recent Labs  Lab 04/29/19 0234 04/30/19 0415 05/01/19 0120 05/02/19 0300 05/03/19 1127 05/04/19 0720 05/05/19 0043  NA 133* 137 137 139 135 136 132*  K 4.6 4.3 4.5 4.6 5.8* 5.3* 5.2*  CL 100 104 105 107 100 102 101  CO2 21* 21* 22 22 23 24 23   GLUCOSE 185* 164* 201* 182* 305* 151* 198*  BUN 27* 41* 42* 33* 33* 27* 37*  CREATININE 1.28* 1.23 1.20 1.00 1.00 1.02 1.06  CALCIUM 8.5* 8.8* 8.7* 8.8* 8.8* 9.0 9.0  MG 2.4 2.6* 2.8* 2.7* 2.7*  --   --   PHOS 2.5 4.2 3.6 3.2 3.8  --   --    GFR: Estimated Creatinine Clearance: 86.5 mL/min (by C-G formula based on SCr of 1.06 mg/dL). Liver Function Tests: Recent Labs  Lab 04/30/19 0415 05/01/19 0120 05/02/19 0300 05/03/19 1127 05/04/19 0720  AST 42* 34 28 36 27  ALT 30 32 32 33 31  ALKPHOS 58 65 63 68 63  BILITOT 0.3 0.5 0.4 0.8 0.4  PROT 8.2* 8.2* 8.1 9.2* 8.1  ALBUMIN 2.7* 2.6* 2.5* 3.0* 2.5*   No results for input(s): LIPASE, AMYLASE in the last 168 hours. No results for input(s): AMMONIA in the last 168 hours. Coagulation Profile: No  results for input(s): INR, PROTIME in the last 168 hours. Cardiac Enzymes: No results for input(s): CKTOTAL, CKMB, CKMBINDEX, TROPONINI in the last 168 hours. BNP (last 3 results) No results for input(s): PROBNP in the last 8760 hours. HbA1C: No results for input(s): HGBA1C in the last 72 hours. CBG: Recent Labs  Lab 05/04/19 0834 05/04/19 1116 05/04/19 1716 05/05/19 0758 05/05/19 1138  GLUCAP 195* 371* 473* 157* 326*   Lipid Profile: No results for input(s): CHOL, HDL, LDLCALC, TRIG, CHOLHDL, LDLDIRECT in the last 72 hours. Thyroid Function Tests: No results for input(s): TSH, T4TOTAL, FREET4, T3FREE, THYROIDAB in the last 72 hours. Anemia Panel: Recent Labs    05/04/19 0720 05/05/19 0043  FERRITIN 636* 587*   Urine analysis:    Component Value Date/Time   COLORURINE YELLOW 07/09/2015 2219   APPEARANCEUR Clear 01/12/2016 1634   LABSPEC 1.015 07/09/2015 2219   PHURINE 7.0 07/09/2015 2219   GLUCOSEU 3+ (A)  01/12/2016 [place] 07/09/2015 2219   BILIRUBINUR negative 12/06/2018 1659   BILIRUBINUR Negative 01/12/2016 [place] 07/09/2015 2219   PROTEINUR Negative 12/06/2018 1659   PROTEINUR Negative 01/12/2016 [place] 07/09/2015 2219   UROBILINOGEN 2.0 (A) 12/06/2018 1659   UROBILINOGEN 1 10/23/2013 1555   NITRITE negative 12/06/2018 1659   NITRITE Negative 01/12/2016 1634   NITRITE NEGATIVE 07/09/2015 2219   LEUKOCYTESUR Negative 12/06/2018 1659   LEUKOCYTESUR Negative 01/12/2016 1634   Sepsis Labs: @LABRCNTIP (procalcitonin:4,lacticidven:4)  ) Recent Results (from the past 240 hour(s))  Blood Culture (routine x 2)     Status: None   Collection Time: 04/28/19  9:40 AM   Specimen: BLOOD  Result Value Ref Range Status   Specimen Description BLOOD RIGHT ANTECUBITAL  Final   Special Requests   Final    BOTTLES DRAWN AEROBIC AND ANAEROBIC Blood Culture results may not be optimal due to an excessive volume of blood  received in culture bottles   Culture   Final    NO GROWTH 5 DAYS Performed at [facility] [address], [place], [place] [address]    Report Status 05/03/2019 FINAL  Final  Blood Culture (routine x 2)     Status: None   Collection Time: 04/28/19  3:54 PM   Specimen: BLOOD  Result Value Ref Range Status   Specimen Description BLOOD LEFT ANTECUBITAL  Final   Special Requests   Final    BOTTLES DRAWN AEROBIC AND ANAEROBIC Blood Culture results may not be optimal due to an inadequate volume of blood received in culture bottles   Culture   Final    NO GROWTH 5 DAYS Performed at [facility], [place] [address] [place], [place] [address]    Report Status 05/03/2019 FINAL  Final      Studies: No results found.  Scheduled Meds: . allopurinol  100 mg Oral Daily  . amLODipine  5 mg Oral Daily  . aspirin EC  81 mg Oral Daily  . carvedilol  12.5 mg Oral BID  . dexamethasone (DECADRON) injection  6 mg Intravenous Q24H  . enoxaparin (LOVENOX) injection  55 mg Subcutaneous Daily  . feeding supplement (GLUCERNA SHAKE)  237 mL Oral TID BM  . insulin aspart  0-20 Units Subcutaneous TID WC  . insulin aspart  0-5 Units Subcutaneous QHS  . insulin glargine  18 Units Subcutaneous BID  . linagliptin  5 mg Oral Daily  . multivitamin with minerals  1 tablet Oral Daily  . rosuvastatin  20 mg Oral Daily  . tamsulosin  0.4 mg Oral Daily    Continuous Infusions:    LOS: 7 days     Annita Brod, MD [facility]  To reach me or the doctor on call, go to: [email or web address] Password [email or web address]  05/05/2019, 4:55 PM

## 2019-05-05 NOTE — Plan of Care (Signed)
Plan of Care reviewed. 

## 2019-05-06 LAB — CBC
HCT: 43.5 % (ref 39.0–52.0)
Hemoglobin: 13.3 g/dL (ref 13.0–17.0)
MCH: 21 pg — ABNORMAL LOW (ref 26.0–34.0)
MCHC: 30.6 g/dL (ref 30.0–36.0)
MCV: 68.6 fL — ABNORMAL LOW (ref 80.0–100.0)
Platelets: 445 10*3/uL — ABNORMAL HIGH (ref 150–400)
RBC: 6.34 MIL/uL — ABNORMAL HIGH (ref 4.22–5.81)
RDW: 16.2 % — ABNORMAL HIGH (ref 11.5–15.5)
WBC: 20.6 10*3/uL — ABNORMAL HIGH (ref 4.0–10.5)
nRBC: 0 % (ref 0.0–0.2)

## 2019-05-06 LAB — GLUCOSE, CAPILLARY
Glucose-Capillary: 324 mg/dL — ABNORMAL HIGH (ref 70–99)
Glucose-Capillary: 323 mg/dL — ABNORMAL HIGH (ref 70–99)
Glucose-Capillary: 362 mg/dL — ABNORMAL HIGH (ref 70–99)
Glucose-Capillary: 186 mg/dL — ABNORMAL HIGH (ref 70–99)
Glucose-Capillary: 399 mg/dL — ABNORMAL HIGH (ref 70–99)
Glucose-Capillary: 362 mg/dL — ABNORMAL HIGH (ref 70–99)
Glucose-Capillary: 401 mg/dL — ABNORMAL HIGH (ref 70–99)

## 2019-05-06 LAB — BASIC METABOLIC PANEL
Anion gap: 9 (ref 5–15)
BUN: 32 mg/dL — ABNORMAL HIGH (ref 8–23)
CO2: 25 mmol/L (ref 22–32)
Calcium: 9.1 mg/dL (ref 8.9–10.3)
Chloride: 99 mmol/L (ref 98–111)
Creatinine, Ser: 0.98 mg/dL (ref 0.61–1.24)
GFR calc Af Amer: >60 mL/min (ref >60)
GFR calc non Af Amer: >60 mL/min (ref >60)
Glucose, Bld: 160 mg/dL — ABNORMAL HIGH (ref 70–99)
Potassium: 5.2 mmol/L — ABNORMAL HIGH (ref 3.5–5.1)
Sodium: 133 mmol/L — ABNORMAL LOW (ref 135–145)

## 2019-05-06 LAB — LACTIC ACID, PLASMA: Lactic Acid, Venous: 1.4 mmol/L (ref 0.5–1.9)

## 2019-05-06 LAB — C-REACTIVE PROTEIN: CRP: 0.8 mg/dL (ref <1.0)

## 2019-05-06 LAB — D-DIMER, QUANTITATIVE: D-Dimer, Quant: 1.4 ug/mL-FEU — ABNORMAL HIGH (ref 0.00–0.50)

## 2019-05-06 LAB — FERRITIN: Ferritin: 672 ng/mL — ABNORMAL HIGH (ref 24–336)

## 2019-05-06 MED ORDER — INFLUENZA VAC SPLIT QUAD 0.5 ML IM SUSY: 0.5000 mL | PREFILLED_SYRINGE | INTRAMUSCULAR | Status: DC

## 2019-05-06 MED ORDER — INSULIN ASPART 100 UNIT/ML ~~LOC~~ SOLN
10.0000 [IU] | Freq: Once | SUBCUTANEOUS | Status: AC
  Administered 2019-05-06: 10 [IU] via SUBCUTANEOUS

## 2019-05-06 MED ORDER — GLUCERNA SHAKE PO LIQD
237.0000 mL | Freq: Two times a day (BID) | ORAL | Status: DC
  Administered 2019-05-06: 15:00:00 237 mL via ORAL

## 2019-05-06 MED ORDER — ENSURE MAX PROTEIN PO LIQD
11.0000 [oz_av] | Freq: Every day | ORAL | Status: DC
  Administered 2019-05-06 – 2019-05-14 (×9): 11 [oz_av] via ORAL
  Filled 2019-05-06 (×10): qty 330

## 2019-05-06 MED ORDER — INFLUENZA VAC SPLIT QUAD 0.5 ML IM SUSY
0.5000 mL | PREFILLED_SYRINGE | INTRAMUSCULAR | Status: DC | PRN
  Filled 2019-05-06: qty 0.5

## 2019-05-06 NOTE — Plan of Care (Signed)
  Problem: Education: Goal: Knowledge of risk factors and measures for prevention of condition will improve Outcome: Progressing   Problem: Coping: Goal: Psychosocial and spiritual needs will be supported Outcome: Progressing   Problem: Respiratory: Goal: Will maintain a patent airway Outcome: Progressing   

## 2019-05-06 NOTE — Progress Notes (Signed)
Pt ambulated approx total of 282ft down hallway and back with walker and supervision of nurse. Tolerated well with no acute distress. Pt wearing 3L 02 at rest and titrated up to 6L [place] during ambulation with sp02 at 91%.

## 2019-05-06 NOTE — Progress Notes (Signed)
Nutrition Follow-up  RD working remotely.  DOCUMENTATION CODES:   Obesity unspecified  INTERVENTION:   -Continue MVI with minerals daily -Magic cup TID with meals, each supplement provides 290 kcal and 9 grams of protein -Decrease Glucerna Shake po to BID, each supplement provides 220 kcal and 10 grams of protein -Ensure Max po daily, each supplement provides 150 kcal and 30 grams of protein  NUTRITION DIAGNOSIS:   Increased nutrient needs related to acute illness as evidenced by estimated needs.  Ongoing  GOAL:   Patient will meet greater than or equal to 90% of their needs  Progressing   MONITOR:   PO intake, Supplement acceptance, Labs, Weight trends  REASON FOR ASSESSMENT:   Malnutrition Screening Tool    ASSESSMENT:   63 year-old with a history of DM, CAD, HTN, HLD, and bilateral clubfoot. He initially presented to Urgent Care on 12/16 with fatigue, anorexia, and loss of taste and was ultimately found to be COVID positive. Since that time, he has developed increasing SOB with refractory cough. He presented to the [facility] ED 12/20 where CXR noted bilateral pulmonary infiltrates and he required 10L HFNC to keep his saturations in the mid 90s.  12/16 Covid positive at [facility] 12/20 admit via [facility] ED - [facility] 12/24 last dose of remdesivir  Reviewed I/O's: -1.7 L x 24 hours and -765 ml since admission  UOP: 1.7 L x 24 hours  Per MD notes, pt with persistent hyperglycemic episodes and supplements were adjusted from Ensure to Glucerna shake. Pt's intake has been improved since last visit, from 25-50% to 75-100%. Wt continues to be stable.   Medications reviewed and include IV decadron.   Labs reviewed: Na: 133, K: 5.2, CBGS: 186-296 (inpatient orders for glycemic control are 0-20 units insulin aspart TID with meals, 0-5 units insulin aspart q HS, 5 mg linagliptin daily, and 18 units insulin glargine BID).   Diet Order:   Diet Order       Diet Carb Modified Fluid consistency: Thin; Room service appropriate? Yes  Diet effective now              EDUCATION NEEDS:   No education needs have been identified at this time  Skin:  Skin Assessment: Reviewed RN Assessment  Last BM:  05/04/19  Height:   Ht Readings from Last 1 Encounters:  04/28/19 5\' 8"  (1.727 m)    Weight:   Wt Readings from Last 1 Encounters:  04/28/19 108.9 kg    Ideal Body Weight:  70 kg  BMI:  Body mass index is 36.49 kg/m.  Estimated Nutritional Needs:   Kcal:  2000-2200 kcal  Protein:  100-120 grams  Fluid:  >/= 2.2 L/day    Navy Belay A. Jimmye Norman, RD, LDN, [place] Registered Dietitian II Certified Diabetes Care and Education Specialist Pager: [phone] After hours Pager: [phone]

## 2019-05-06 NOTE — Progress Notes (Signed)
Occupational Therapy Treatment Patient Details Name: Pepe Mineau MRN: [id number] DOB: 1956-04-04 Today's Date: 05/06/2019    History of present illness Pt is a 63 y.o. male admitted 04/28/19 with worsening SOB and cough; initial (+) COVID-19 on 04/24/19. PMH includes DM2, HTN, CKD II, bilateral club foot.   OT comments  Pt continues to make progress in therapy, demonstrating increased independence in self-care and functional transfer tasks as well as requiring less supplemental oxygen. Pt able to ambulate to/from bathroom and complete toileting task with supervision. Pt tolerated standing at the sink 1 x 10 min to complete grooming, hygiene, and sponge bathing task. Pt's SpO2 decreased to 86% on room air, with pt requiring ~3 min seated rest break to return to 92%. 2/4 DOE. Continued education and instruction with pt on pursed lip breathing strategies with fair understanding and follow through. Educated and provided pt with handouts regarding energy conservation and relaxation strategies. Recommend HH OT for continued rehab following hospital discharge.    Follow Up Recommendations  Home health OT;Supervision - Intermittent    Equipment Recommendations  None recommended by OT    Recommendations for Other Services      Precautions / Restrictions Precautions Precautions: Fall Precaution Comments: trying to wean from O2 ,Use ear to monitor, reads highter than finger- when ambulated today, had on finger , then double checked on ear. Restrictions Weight Bearing Restrictions: No       Mobility Bed Mobility               General bed mobility comments: Pt seated in bedside chair upon OT arrival.  Transfers Overall transfer level: Needs assistance Equipment used: None Transfers: Sit to/from Stand Sit to Stand: Supervision         General transfer comment: extra time to rise from lower surface but no assistance required    Balance Overall balance assessment: Needs  assistance   Sitting balance-Leahy Scale: Good       Standing balance-Leahy Scale: Fair Standing balance comment: Dynamic standing balance improved with UE support                           ADL either performed or assessed with clinical judgement   ADL Overall ADL's : Needs assistance/impaired     Grooming: Wash/dry hands;Wash/dry face;Oral care;Supervision/safety;Standing   Upper Body Bathing: Supervision/ safety;Standing   Lower Body Bathing: Supervison/ safety;Sit to/from stand           Toilet Transfer: Supervision/safety;Ambulation;Grab bars;Regular Social worker and Hygiene: Supervision/safety;Sit to/from stand       Functional mobility during ADLs: Supervision/safety General ADL Comments: Pt able to ambulate to/from bathroom with supervision, noting 0 instances of LOB.     Vision       Perception     Praxis      Cognition Arousal/Alertness: Awake/alert Behavior During Therapy: WFL for tasks assessed/performed Overall Cognitive Status: Within Functional Limits for tasks assessed                                          Exercises Other Exercises Other Exercises: TB for UE's, seated LAQ and marching instructions   Shoulder Instructions       General Comments Pt on room air with SpO2 93% at rest. Pt able to ambulate to/from bathroom and stand 1 x 10 min at the sink  to complete grooming, hygiene, and bathing task. SpO2 decreased to 86% on room air with pt requiring ~3 min seated rest break to increase back to 92%.     Pertinent Vitals/ Pain       Pain Assessment: No/denies pain  Home Living                                          Prior Functioning/Environment              Frequency           Progress Toward Goals  OT Goals(current goals can now be found in the care plan section)  Progress towards OT goals: Progressing toward goals  ADL Goals Pt Will Perform  Grooming: with modified independence;standing Pt Will Perform Lower Body Bathing: with modified independence;sit to/from stand Pt Will Perform Lower Body Dressing: with modified independence;sit to/from stand Pt Will Transfer to Toilet: with modified independence;ambulating;regular height toilet Pt Will Perform Toileting - Clothing Manipulation and hygiene: with modified independence;sit to/from stand Additional ADL Goal #1: Pt to recall and verbalize 3 energy conservation strategies with 0 verbal cues. Additional ADL Goal #2: Pt to tolerate standing up to 5 min independently with SpO2 maintaining above 90%, in preparation for ADLs.  Plan Discharge plan remains appropriate    Co-evaluation                 AM-PAC OT "6 Clicks" Daily Activity     Outcome Measure   Help from another person eating meals?: None Help from another person taking care of personal grooming?: A Little Help from another person toileting, which includes using toliet, bedpan, or urinal?: A Little Help from another person bathing (including washing, rinsing, drying)?: A Little Help from another person to put on and taking off regular upper body clothing?: A Little Help from another person to put on and taking off regular lower body clothing?: A Little 6 Click Score: 19    End of Session    OT Visit Diagnosis: Unsteadiness on feet (R26.81);Muscle weakness (generalized) (M62.81)   Activity Tolerance Patient tolerated treatment well   Patient Left in chair;with call bell/phone within reach   Nurse Communication Mobility status        Time: 6237-6283 OT Time Calculation (min): 29 min  Charges: OT General Charges $OT Visit: 1 Visit OT Treatments $Self Care/Home Management : 8-22 mins $Therapeutic Activity: 8-22 mins  Mauri Brooklyn OTR/L [phone]    Mauri Brooklyn 05/06/2019, 4:13 PM

## 2019-05-06 NOTE — Progress Notes (Signed)
Inpatient Diabetes Program Recommendations  AACE/ADA: New Consensus Statement on Inpatient Glycemic Control (2015)  Target Ranges:  Prepandial:   less than 140 mg/dL      Peak postprandial:   less than 180 mg/dL (1-2 hours)      Critically ill patients:  140 - 180 mg/dL   Lab Results  Component Value Date   GLUCAP 399 (H) 05/06/2019   HGBA1C 9.2 (H) 04/29/2019    Review of Glycemic Control Results for SANDRA, TELLEFSEN (MRN [id number]) as of 05/06/2019 13:14  Ref. Range 05/05/2019 11:38 05/05/2019 16:49 05/05/2019 21:36 05/06/2019 07:47 05/06/2019 11:51  Glucose-Capillary Latest Ref Range: 70 - 99 mg/dL 326 (H) 369 (H) 296 (H) 186 (H) 399 (H)   Diabetes history: DM 2 Outpatient Diabetes medications:  Jardiance 10 mg daily, Glipizide 10 mg bid, Victoza 1.8 mg daily Current orders for Inpatient glycemic control:  Lantus 18 units bid, Novolog resistant tid with meals and HS Decadron 6 mg IV daily Tradjenta 5 mg daily Inpatient Diabetes Program Recommendations:    Please consider adding Novolog meal coverage 5 units tid with meals (hold if patient eats less than 50%) while on steroids.   Thanks  Adah Perl, RN, BC-ADM Inpatient Diabetes Coordinator Pager [phone] (8a-5p)

## 2019-05-06 NOTE — Plan of Care (Signed)
Plan of Care reviewed. 

## 2019-05-06 NOTE — Progress Notes (Signed)
Daniel Perkins  [id number] DOB: 09/12/1955 DOA: 04/28/2019 PCP: Eliezer BottomAslam, Sadia, MD    Brief Narrative:  63yo with a history of DM, CAD, HTN, HLD, and bilateral clubfoot who originally presented to an urgent care center 12/16 with fatigue, anorexia, and loss of taste and was ultimately found to be Covid positive. Since that time he has developed increasing shortness of breath with refractory cough. He [facility] 12/20 where a CXR noted bilateral pulmonary infiltrates and he required 10 L high flow nasal cannula oxygen to keep his saturations in the mid 90s.  Significant Events: 12/16 [facility]id [facility] at [facility] 12/20 admit via [facility] - [facility]  COVID-19 specific Treatment: Remdesivir 12/20 > 12/24 Decadron 12/20 > 12/28  Antimicrobials:  Azithromycin 12/20 > 12/24 Ceftriaxone 12/20 > 12/24  Subjective: Breathing comfortably in a bedside chair.  Alternating between 2 L nasal cannula and room air when at rest.  Denies chest pain nausea vomiting abdominal pain.  Assessment & Plan:  COVID Pneumonia -acute hypoxic respiratory failure Has completed the course of remdesivir -remains on Decadron course -clinically much improved  Recent Labs  Lab 04/30/19 0415 05/01/19 0120 05/02/19 0300 05/03/19 1127 05/04/19 0720 05/05/19 0043 05/05/19 0500 05/06/19 0641  DDIMER 2.25* 2.05* 2.36* 2.82* 2.26* 1.80*  --  1.40*  FERRITIN 823* 687* 662* 667* 636* 587*  --  672*  CRP 12.2* 8.6* 5.4* 3.8* 2.9* 1.9*  --  0.8  ALT 30 32 32 33 31  --   --   --   PROCALCITON  --  0.29 0.11  --   --   --  <0.10  --     DM 2 uncontrolled with hyperglycemia CBG still proving very difficult to control -given clinical improvement will discontinue Decadron and follow  HTN BP controlled - monitor   CKD stage II crt stable for now - cont to follow   Hyponatremia Na improving -continue to follow trend   Hepatic steatosis Noted on CT chest - will need outpt  f/u   HLD Cont home med tx   BPH Cont home med tx   Obesity - Estimated body mass index is 36.49 kg/m as calculated from the following:   Height as of this encounter: 5\' 8"  (1.727 m).   Weight as of this encounter: 108.9 kg.   DVT prophylaxis: Lovenox  Code Status: FULL CODE Family Communication:  Disposition Plan: MedSurg bed  Consultants:  none  Objective: Blood pressure 112/69, pulse 73, temperature 98.4 F (36.9 C), temperature source Oral, resp. rate 17, height 5\' 8"  (1.727 m), weight 108.9 kg, SpO2 94 %.  Intake/Output Summary (Last 24 hours) at 05/06/2019 0949 Last data filed at 05/06/2019 0810 Gross per 24 hour  Intake --  Output 2075 ml  Net -2075 ml   Filed Weights   04/28/19 0919  Weight: 108.9 kg    Examination: General: No acute respiratory distress - alert and conversant  Lungs: Improved air movement throughout with no wheezing Cardiovascular: RRR - no M or rub  Abdomen: NT/ND, soft, bs+, no mass  Extremities: trace B LE edema - no cyanosis    CBC: Recent Labs  Lab 05/01/19 0120 05/02/19 0300 05/03/19 1127 05/04/19 0720 05/05/19 0043 05/06/19 0641  WBC 15.5* 14.0* 14.0* 17.3* 21.2* 20.6*  NEUTROABS 13.0* 11.2* 12.1*  --   --   --   HGB 12.5* 12.8* 13.9 13.5 13.0 13.3  HCT 40.7 41.5 45.6 43.5 42.6 43.5  MCV 67.5* 67.8* 68.8* 67.8* 67.7* 68.6*  PLT 386 422* 434* 468* 465* 445*   Basic Metabolic Panel: Recent Labs  Lab 05/01/19 0120 05/02/19 0300 05/03/19 1127 05/04/19 0720 05/05/19 0043 05/06/19 0641  NA 137 139 135 136 132* 133*  K 4.5 4.6 5.8* 5.3* 5.2* 5.2*  CL 105 107 100 102 101 99  CO2 22 22 23 24 23 25   GLUCOSE 201* 182* 305* 151* 198* 160*  BUN 42* 33* 33* 27* 37* 32*  CREATININE 1.20 1.00 1.00 1.02 1.06 0.98  CALCIUM 8.7* 8.8* 8.8* 9.0 9.0 9.1  MG 2.8* 2.7* 2.7*  --   --   --   PHOS 3.6 3.2 3.8  --   --   --    GFR: Estimated Creatinine Clearance: 93.5 mL/min (by C-G formula based on SCr of 0.98 mg/dL).  Liver  Function Tests: Recent Labs  Lab 05/01/19 0120 05/02/19 0300 05/03/19 1127 05/04/19 0720  AST 34 28 36 27  ALT 32 32 33 31  ALKPHOS 65 63 68 63  BILITOT 0.5 0.4 0.8 0.4  PROT 8.2* 8.1 9.2* 8.1  ALBUMIN 2.6* 2.5* 3.0* 2.5*    HbA1C: Hemoglobin A1C  Date/Time Value Ref Range Status  12/06/2018 05:03 PM 7.4 (A) 4.0 - 5.6 % Final  06/05/2018 04:44 PM 9.7 (A) 4.0 - 5.6 % Final   Hgb A1c MFr Bld  Date/Time Value Ref Range Status  04/29/2019 02:34 AM 9.2 (H) 4.8 - 5.6 % Final    Comment:    (NOTE) Pre diabetes:          5.7%-6.4% Diabetes:              >6.4% Glycemic control for   <7.0% adults with diabetes   03/22/2016 04:13 PM 10.4 (H) 4.8 - 5.6 % Final    Comment:             Pre-diabetes: 5.7 - 6.4          Diabetes: >6.4          Glycemic control for adults with diabetes: <7.0     CBG: Recent Labs  Lab 05/05/19 0758 05/05/19 1138 05/05/19 1649 05/05/19 2136 05/06/19 0747  GLUCAP 157* 326* 369* 296* 186*    Recent Results (from the past 240 hour(s))  Blood Culture (routine x 2)     Status: None   Collection Time: 04/28/19  9:40 AM   Specimen: BLOOD  Result Value Ref Range Status   Specimen Description BLOOD RIGHT ANTECUBITAL  Final   Special Requests   Final    BOTTLES DRAWN AEROBIC AND ANAEROBIC Blood Culture results may not be optimal due to an excessive volume of blood received in culture bottles   Culture   Final    NO GROWTH 5 DAYS Performed at [facility], [address] [place], [place]    Report Status 05/03/2019 FINAL  Final  Blood Culture (routine x 2)     Status: None   Collection Time: 04/28/19  3:54 PM   Specimen: BLOOD  Result Value Ref Range Status   Specimen Description BLOOD LEFT ANTECUBITAL  Final   Special Requests   Final    BOTTLES DRAWN AEROBIC AND ANAEROBIC Blood Culture results may not be optimal due to an inadequate volume of blood received in culture bottles   Culture   Final    NO GROWTH 5 DAYS Performed  at [facility], [address] [place], [place]  Report Status 05/03/2019 FINAL  Final     Scheduled Meds: . allopurinol  100 mg Oral Daily  . amLODipine  5 mg Oral Daily  . aspirin EC  81 mg Oral Daily  . carvedilol  12.5 mg Oral BID  . dexamethasone (DECADRON) injection  6 mg Intravenous Q24H  . enoxaparin (LOVENOX) injection  55 mg Subcutaneous Daily  . feeding supplement (GLUCERNA SHAKE)  237 mL Oral TID BM  . [START ON 05/07/2019] influenza vac split quadrivalent PF  0.5 mL Intramuscular Tomorrow-1000  . insulin aspart  0-20 Units Subcutaneous TID WC  . insulin aspart  0-5 Units Subcutaneous QHS  . insulin glargine  18 Units Subcutaneous BID  . linagliptin  5 mg Oral Daily  . multivitamin with minerals  1 tablet Oral Daily  . rosuvastatin  20 mg Oral Daily  . tamsulosin  0.4 mg Oral Daily      LOS: 8 days   Cherene Altes, MD [facility] Office  [phone] Pager - Text Page per Amion  If 7PM-7AM, please contact night-coverage per Amion 05/06/2019, 9:49 AM

## 2019-05-06 NOTE — Progress Notes (Signed)
Physical Therapy Treatment Patient Details Name: Daniel Perkins MRN: [id number] DOB: 11-11-1955 Today's Date: 05/06/2019    History of Present Illness Pt is a 63 y.o. male admitted 04/28/19 with worsening SOB and cough; initial (+) COVID-19 on 04/24/19. PMH includes DM2, HTN, CKD II, bilateral club foot.    PT Comments    The patient's SPO2 Tested on finger probe  At rest 92%, while ambulating dropping to 84% on RA( finger probe0. Placed on 2 then 4 L [place] to get SPO2 up to 88%. After return to room and sitting on RA, placed probe on ear and  SPO2 95%. Patient will need another walk test  With ear monitor. Patient expresses concern for return home, father is in [place], children work.  Will continue to progress mobility and assess safety for DC home alone and oxygen needs.   Follow Up Recommendations  Home health PT;Supervision - Intermittent     Equipment Recommendations  Rolling walker with 5" wheels    Recommendations for Other Services       Precautions / Restrictions Precautions Precautions: Fall Precaution Comments: trying to wean from O2 ,Use ear to monitor, reads highter than finger- when ambulated today, had on finger , then double checked on ear.    Mobility  Bed Mobility               General bed mobility comments: Received sitting on bed edge  Transfers   Equipment used: None Transfers: Sit to/from Stand Sit to Stand: Supervision         General transfer comment: extra time to rise from lower surface but no assistance required  Ambulation/Gait Ambulation/Gait assistance: Min guard Gait Distance (Feet): 125 Feet Assistive device: Rolling walker (2 wheeled) Gait Pattern/deviations: Step-through pattern;Decreased stride length Gait velocity: decr   General Gait Details: patient ambulated using RW, did not attempt without.Patient  tried on Ra with finger probe reading 86%, placed up to 4 L to get SPO2 up to 88%. then tested ear and readings higher. Did not  actuzally ambulate patient with ear probe in place.   Stairs             Wheelchair Mobility    Modified Rankin (Stroke Patients Only)       Balance Overall balance assessment: Needs assistance   Sitting balance-Leahy Scale: Good       Standing balance-Leahy Scale: Fair Standing balance comment: Dynamic standing balance improved with UE support                            Cognition Arousal/Alertness: Awake/alert                                            Exercises Other Exercises Other Exercises: TB for UE's, seated LAQ and marching instructions    General Comments        Pertinent Vitals/Pain Pain Assessment: No/denies pain    Home Living                      Prior Function            PT Goals (current goals can now be found in the care plan section) Progress towards PT goals: Progressing toward goals    Frequency    Min 3X/week      PT Plan Current plan  remains appropriate    Co-evaluation              AM-PAC PT "6 Clicks" Mobility   Outcome Measure  Help needed turning from your back to your side while in a flat bed without using bedrails?: A Little Help needed moving from lying on your back to sitting on the side of a flat bed without using bedrails?: A Little Help needed moving to and from a bed to a chair (including a wheelchair)?: A Little Help needed standing up from a chair using your arms (e.g., wheelchair or bedside chair)?: A Little Help needed to walk in hospital room?: A Little Help needed climbing 3-5 steps with a railing? : A Lot 6 Click Score: 17    End of Session Equipment Utilized During Treatment: Oxygen Activity Tolerance: Patient tolerated treatment well Patient left: in chair;with call bell/phone within reach Nurse Communication: Mobility status PT Visit Diagnosis: Other abnormalities of gait and mobility (R26.89)     Time: 5732-2025 PT Time Calculation (min) (ACUTE  ONLY): 38 min  Charges:  $Gait Training: 38-52 mins                     [place] Pager [phone] Office [phone]    Claretha Cooper 05/06/2019, 2:18 PM

## 2019-05-07 LAB — GLUCOSE, CAPILLARY
Glucose-Capillary: 146 mg/dL — ABNORMAL HIGH (ref 70–99)
Glucose-Capillary: 182 mg/dL — ABNORMAL HIGH (ref 70–99)
Glucose-Capillary: 193 mg/dL — ABNORMAL HIGH (ref 70–99)
Glucose-Capillary: 240 mg/dL — ABNORMAL HIGH (ref 70–99)
Glucose-Capillary: 241 mg/dL — ABNORMAL HIGH (ref 70–99)

## 2019-05-07 LAB — CBC
HCT: 41.4 % (ref 39.0–52.0)
Hemoglobin: 12.8 g/dL — ABNORMAL LOW (ref 13.0–17.0)
MCH: 21 pg — ABNORMAL LOW (ref 26.0–34.0)
MCHC: 30.9 g/dL (ref 30.0–36.0)
MCV: 67.9 fL — ABNORMAL LOW (ref 80.0–100.0)
Platelets: 448 10*3/uL — ABNORMAL HIGH (ref 150–400)
RBC: 6.1 MIL/uL — ABNORMAL HIGH (ref 4.22–5.81)
RDW: 15.3 % (ref 11.5–15.5)
WBC: 21.4 10*3/uL — ABNORMAL HIGH (ref 4.0–10.5)
nRBC: 0 % (ref 0.0–0.2)

## 2019-05-07 LAB — COMPREHENSIVE METABOLIC PANEL
ALT: 53 U/L — ABNORMAL HIGH (ref 0–44)
AST: 28 U/L (ref 15–41)
Albumin: 2.8 g/dL — ABNORMAL LOW (ref 3.5–5.0)
Alkaline Phosphatase: 63 U/L (ref 38–126)
Anion gap: 9 (ref 5–15)
BUN: 35 mg/dL — ABNORMAL HIGH (ref 8–23)
CO2: 24 mmol/L (ref 22–32)
Calcium: 9.1 mg/dL (ref 8.9–10.3)
Chloride: 98 mmol/L (ref 98–111)
Creatinine, Ser: 0.93 mg/dL (ref 0.61–1.24)
GFR calc Af Amer: >60 mL/min (ref >60)
GFR calc non Af Amer: >60 mL/min (ref >60)
Glucose, Bld: 199 mg/dL — ABNORMAL HIGH (ref 70–99)
Potassium: 4.8 mmol/L (ref 3.5–5.1)
Sodium: 131 mmol/L — ABNORMAL LOW (ref 135–145)
Total Bilirubin: 0.7 mg/dL (ref 0.3–1.2)
Total Protein: 7.8 g/dL (ref 6.5–8.1)

## 2019-05-07 MED ORDER — INSULIN GLARGINE 100 UNIT/ML ~~LOC~~ SOLN
14.0000 [IU] | Freq: Two times a day (BID) | SUBCUTANEOUS | Status: DC
  Administered 2019-05-07 – 2019-05-15 (×16): 14 [IU] via SUBCUTANEOUS
  Filled 2019-05-07 (×18): qty 0.14

## 2019-05-07 NOTE — Plan of Care (Signed)
  Problem: Education: Goal: Knowledge of risk factors and measures for prevention of condition will improve Outcome: Progressing   Problem: Coping: Goal: Psychosocial and spiritual needs will be supported Outcome: Progressing   Problem: Respiratory: Goal: Will maintain a patent airway Outcome: Progressing Goal: Complications related to the disease process, condition or treatment will be avoided or minimized Outcome: Progressing   

## 2019-05-07 NOTE — TOC Progression Note (Signed)
Transition of Care [facility]) - Progression Note    Patient Details  Name: Daniel Perkins MRN: [id number] Date of Birth: 1956/01/20  Transition of [facility] CM/SW Contact  Joaquin Courts, RN Phone Number: 05/07/2019, 1:55 PM  Clinical Narrative:    CM spoke with [employer] rep [place] and made aware of patient's need for O2 at dc.  Patient will need desaturation screen prior to dc.     Expected Discharge Plan: [place] Barriers to Discharge: Continued Medical Work up  Expected Discharge Plan and Services Expected Discharge Plan: [place] Choice: Durable Medical Equipment, Home Health                   DME Arranged: Oxygen DME Agency: [employer] Date DME Agency Contacted: 05/04/19 Time DME Agency Contacted: [id number] Representative spoke with at DME Agency: Kipp Laurence Arranged: PT [place]: [place] Date [place]: 05/03/19   Representative spoke with at [place]: [place] ([place]) Interventions    Readmission Risk Interventions No flowsheet data found.

## 2019-05-07 NOTE — Progress Notes (Signed)
Daniel GessGarry Perkins  ZOX:[id number] DOB: 09/17/1955 DOA: 04/28/2019 PCP: Eliezer BottomAslam, Sadia, MD    Brief Narrative:  63yo with a history of DM, CAD, HTN, HLD, and bilateral clubfoot who originally presented to an urgent care center 12/16 with fatigue, anorexia, and loss of taste and was ultimately found to be Covid positive. Since that time he has developed increasing shortness of breath with refractory cough. He [facility] 12/20 where a CXR noted bilateral pulmonary infiltrates and he required 10 L high flow nasal cannula oxygen to keep his saturations in the mid 90s.  Significant Events: 12[facility] 12/20 admit via [place] [facility]  COVID-19 specific Treatment: Remdesivir 12/20 > 12/24 Decadron 12/20 > 12/28  Antimicrobials:  Azithromycin 12/20 > 12/24 Ceftriaxone 12/20 > 12/24  Subjective: Resting comfortably in the bedside chair.  Reports that he still feels very weak when attempting to move.  Feels somewhat short of breath when attempting to move.  Overall feels that he is improving.  Assessment & Plan:  COVID Pneumonia -acute hypoxic respiratory failure Has completed a course of remdesivir and Decadron course -clinically much improved - attempt to wean fully to RA, though pt likely to still require O2 support w/ exertion   DM 2 uncontrolled with hyperglycemia CBG much better controlled with discontinuation of steroid -hopeful to return to usual home regimen at time of discharge  HTN BP controlled  CKD stage II crt stable   Hyponatremia Sodium is steady at this time  Hepatic steatosis Noted on CT chest - will need outpt f/u   HLD Cont home med tx   BPH Cont home med tx   Obesity - Estimated body mass index is 36.49 kg/m as calculated from the following:   Height as of this encounter: 5\' 8"  (1.727 m).   Weight as of this encounter: 108.9 kg.   DVT prophylaxis: Lovenox  Code Status: FULL CODE Family  Communication:  Disposition Plan: MedSurg bed -ambulate -attempt to wean from oxygen even with exertion -anticipate discharge home 12/30  Consultants:  none  Objective: Blood pressure 115/82, pulse 78, temperature 97.6 F (36.4 C), temperature source Oral, resp. rate 18, height 5\' 8"  (1.727 m), weight 108.9 kg, SpO2 91 %.  Intake/Output Summary (Last 24 hours) at 05/07/2019 1148 Last data filed at 05/07/2019 1100 Gross per 24 hour  Intake 350 ml  Output 1275 ml  Net -925 ml   Filed Weights   04/28/19 0919  Weight: 108.9 kg    Examination: General: No acute respiratory distress  Lungs: Good air movement bilaterally with no wheezing Cardiovascular: RRR Abdomen: NT/ND, soft, bs+, no mass  Extremities: trace B LE edema    CBC: Recent Labs  Lab 05/01/19 0120 05/02/19 0300 05/03/19 1127 05/05/19 0043 05/06/19 0641 05/07/19 0146  WBC 15.5* 14.0* 14.0* 21.2* 20.6* 21.4*  NEUTROABS 13.0* 11.2* 12.1*  --   --   --   HGB 12.5* 12.8* 13.9 13.0 13.3 12.8*  HCT 40.7 41.5 45.6 42.6 43.5 41.4  MCV 67.5* 67.8* 68.8* 67.7* 68.6* 67.9*  PLT 386 422* 434* 465* 445* 448*   Basic Metabolic Panel: Recent Labs  Lab 05/01/19 0120 05/02/19 0300 05/03/19 1127 05/05/19 0043 05/06/19 0641 05/07/19 0146  NA 137 139 135 132* 133* 131*  K 4.5 4.6 5.8* 5.2* 5.2* 4.8  CL 105 107 100 101 99 98  CO2 22 22 23 23 25 24   GLUCOSE 201* 182* 305* 198* 160*  199*  BUN 42* 33* 33* 37* 32* 35*  CREATININE 1.20 1.00 1.00 1.06 0.98 0.93  CALCIUM 8.7* 8.8* 8.8* 9.0 9.1 9.1  MG 2.8* 2.7* 2.7*  --   --   --   PHOS 3.6 3.2 3.8  --   --   --    GFR: Estimated Creatinine Clearance: 98.5 mL/min (by C-G formula based on SCr of 0.93 mg/dL).  Liver Function Tests: Recent Labs  Lab 05/02/19 0300 05/03/19 1127 05/04/19 0720 05/07/19 0146  AST 28 36 27 28  ALT 32 33 31 53*  ALKPHOS 63 68 63 63  BILITOT 0.4 0.8 0.4 0.7  PROT 8.1 9.2* 8.1 7.8  ALBUMIN 2.5* 3.0* 2.5* 2.8*    HbA1C: Hemoglobin  A1C  Date/Time Value Ref Range Status  12/06/2018 05:03 PM 7.4 (A) 4.0 - 5.6 % Final  06/05/2018 04:44 PM 9.7 (A) 4.0 - 5.6 % Final   Hgb A1c MFr Bld  Date/Time Value Ref Range Status  04/29/2019 02:34 AM 9.2 (H) 4.8 - 5.6 % Final    Comment:    (NOTE) Pre diabetes:          5.7%-6.4% Diabetes:              >6.4% Glycemic control for   <7.0% adults with diabetes   03/22/2016 04:13 PM 10.4 (H) 4.8 - 5.6 % Final    Comment:             Pre-diabetes: 5.7 - 6.4          Diabetes: >6.4          Glycemic control for adults with diabetes: <7.0     CBG: Recent Labs  Lab 05/06/19 1151 05/06/19 1602 05/06/19 2049 05/07/19 0043 05/07/19 0811  GLUCAP 399* 362* 401* 240* 146*    Recent Results (from the past 240 hour(s))  Blood Culture (routine x 2)     Status: None   Collection Time: 04/28/19  9:40 AM   Specimen: BLOOD  Result Value Ref Range Status   Specimen Description BLOOD RIGHT ANTECUBITAL  Final   Special Requests   Final    BOTTLES DRAWN AEROBIC AND ANAEROBIC Blood Culture results may not be optimal due to an excessive volume of blood received in culture bottles   Culture   Final    NO GROWTH 5 DAYS Performed at [facility] Lab, [address] [place], [place] [address]    Report Status 05/03/2019 FINAL  Final  Blood Culture (routine x 2)     Status: None   Collection Time: 04/28/19  3:54 PM   Specimen: BLOOD  Result Value Ref Range Status   Specimen Description BLOOD LEFT ANTECUBITAL  Final   Special Requests   Final    BOTTLES DRAWN AEROBIC AND ANAEROBIC Blood Culture results may not be optimal due to an inadequate volume of blood received in culture bottles   Culture   Final    NO GROWTH 5 DAYS Performed at [facility], [address] [place], [place] [address]    Report Status 05/03/2019 FINAL  Final     Scheduled Meds: . allopurinol  100 mg Oral Daily  . amLODipine  5 mg Oral Daily  . aspirin EC  81 mg Oral Daily  . carvedilol  12.5 mg  Oral BID  . enoxaparin (LOVENOX) injection  55 mg Subcutaneous Daily  . insulin aspart  0-20 Units Subcutaneous TID WC  . insulin aspart  0-5 Units Subcutaneous  QHS  . insulin glargine  18 Units Subcutaneous BID  . linagliptin  5 mg Oral Daily  . multivitamin with minerals  1 tablet Oral Daily  . Ensure Max Protein  11 oz Oral QHS  . rosuvastatin  20 mg Oral Daily  . tamsulosin  0.4 mg Oral Daily      LOS: 9 days   Cherene Altes, MD [facility] Office  [phone] Pager - Text Page per Amion  If 7PM-7AM, please contact night-coverage per Amion 05/07/2019, 11:48 AM

## 2019-05-08 LAB — GLUCOSE, CAPILLARY
Glucose-Capillary: 117 mg/dL — ABNORMAL HIGH (ref 70–99)
Glucose-Capillary: 186 mg/dL — ABNORMAL HIGH (ref 70–99)
Glucose-Capillary: 189 mg/dL — ABNORMAL HIGH (ref 70–99)
Glucose-Capillary: 192 mg/dL — ABNORMAL HIGH (ref 70–99)

## 2019-05-08 NOTE — Plan of Care (Signed)
  Problem: Education: Goal: Knowledge of risk factors and measures for prevention of condition will improve Outcome: Progressing   Problem: Coping: Goal: Psychosocial and spiritual needs will be supported Outcome: Progressing   Problem: Respiratory: Goal: Will maintain a patent airway Outcome: Progressing Goal: Complications related to the disease process, condition or treatment will be avoided or minimized Outcome: Progressing   

## 2019-05-08 NOTE — Progress Notes (Signed)
Occupational Therapy Treatment Patient Details Name: Daniel Perkins MRN: [id number] DOB: [birth year] Today's Date: 05/08/2019    History of present illness Pt is a 63 y.o. male admitted 04/28/19 with worsening SOB and cough; initial (+) COVID-19 on 04/24/19. PMH includes DM2, HTN, CKD II, bilateral club foot.   OT comments  Pt continues to make progress in therapy, however limited by orthostatic hypotension this date. Pt tolerated standing 1 x 5 min with RW and supervision before having to sit due to dizziness. See below for BP readings. SpO2 decreased to 86% during standing task with pt requiring 3-4 min seated rest break to increase SpO2 back to 93%. 2/4 DOE. Pt demo good use of pursed lip breathing. Continued education with pt on energy conservation, relaxation strategies, and fall prevention techniques. Pt able to recall and verbalize previously learned strategies. Continue to recommend [facility] OT for additional rehab following hospital discharge.  BP Sitting: 94/45mmHg. BP Standing: 97/51mmHg.    Follow Up Recommendations  Home health OT;Supervision - Intermittent    Equipment Recommendations  None recommended by OT    Recommendations for Other Services      Precautions / Restrictions Precautions Precautions: Fall;Other (comment) Precaution Comments: Monitor BP, pt orthostatic during todays session Restrictions Weight Bearing Restrictions: No       Mobility Bed Mobility               General bed mobility comments: Pt seated in bedside chair upon OT arrival.  Transfers Overall transfer level: Needs assistance Equipment used: Rolling walker (2 wheeled) Transfers: Sit to/from Stand Sit to Stand: Supervision         General transfer comment: To ensure balance and safety    Balance Overall balance assessment: Needs assistance   Sitting balance-Leahy Scale: Good       Standing balance-Leahy Scale: Fair                             ADL either  performed or assessed with clinical judgement   ADL                                               Vision       Perception     Praxis      Cognition Arousal/Alertness: Awake/alert Behavior During Therapy: WFL for tasks assessed/performed Overall Cognitive Status: Within Functional Limits for tasks assessed                                          Exercises Exercises: Other exercises Other Exercises Other Exercises: Incentive spirometer x 10. Pulling . Other Exercises: Flutter valve x 10   Shoulder Instructions       General Comments Pt limited by orthostatic hypotension this date. Pt tolerated standing 1 x 5 min with RW before having to sit due to dizziness. SpO2 decreased to 86% on room air during standing task with pt requiring 3-4 min seated rest break to increase back into 90s. Continued education with pt on energy conservation, relaxation strategies, and fall prevention. Noted good recall of learned techniques from previous session.    Pertinent Vitals/ Pain       Pain Assessment: No/denies pain  Home Living  Prior Functioning/Environment              Frequency           Progress Toward Goals  OT Goals(current goals can now be found in the care plan section)  Progress towards OT goals: Progressing toward goals  ADL Goals Pt Will Perform Grooming: with modified independence;standing Pt Will Perform Lower Body Bathing: with modified independence;sit to/from stand Pt Will Perform Lower Body Dressing: with modified independence;sit to/from stand Pt Will Transfer to Toilet: with modified independence;ambulating;regular height toilet Pt Will Perform Toileting - Clothing Manipulation and hygiene: with modified independence;sit to/from stand Additional ADL Goal #1: Pt to recall and verbalize 3 energy conservation strategies with 0 verbal cues. Additional ADL  Goal #2: Pt to tolerate standing up to 5 min independently with SpO2 maintaining above 90%, in preparation for ADLs.  Plan Discharge plan remains appropriate    Co-evaluation                 AM-PAC OT "6 Clicks" Daily Activity     Outcome Measure   Help from another person eating meals?: None Help from another person taking care of personal grooming?: A Little Help from another person toileting, which includes using toliet, bedpan, or urinal?: A Little Help from another person bathing (including washing, rinsing, drying)?: A Little Help from another person to put on and taking off regular upper body clothing?: A Little Help from another person to put on and taking off regular lower body clothing?: A Little 6 Click Score: 19    End of Session Equipment Utilized During Treatment: Rolling walker  OT Visit Diagnosis: Unsteadiness on feet (R26.81);Muscle weakness (generalized) (M62.81)   Activity Tolerance Other (comment)(Pt limited by orthostatic hypotension. RN aware.)   Patient Left in chair;with call bell/phone within reach   Nurse Communication Mobility status        Time: 2703-5009 OT Time Calculation (min): 27 min  Charges: OT General Charges $OT Visit: 1 Visit OT Treatments $Therapeutic Activity: 23-37 mins  Mauri Brooklyn OTR/L [phone]    Mauri Brooklyn 05/08/2019, 5:17 PM

## 2019-05-08 NOTE — Progress Notes (Signed)
Pt stable with no signs of distress. Report given to oncoming nurse. Safety maintained.  

## 2019-05-08 NOTE — Care Management Important Message (Signed)
Important Message  Patient Details  Name: Daniel Perkins MRN: [id number] Date of Birth: [birth year]   Medicare Important Message Given:  Yes - Important Message mailed due to current National Emergency  Verbal consent obtained due to current National Emergency  Relationship to patient: Father Contact Name: Jaymeson Mengel Call Date: 05/08/19  Time: 1256 Phone: [phone] Outcome: No Answer/Busy Important Message mailed to: Patient address on file    Delorse Lek 05/08/2019, 12:56 PM

## 2019-05-08 NOTE — Progress Notes (Signed)
Daniel Perkins  VZD:[id number] DOB: 20-Jun-1955 DOA: 04/28/2019 PCP: Harvie Heck, MD    Brief Narrative:  63yo with a history of DM, CAD, HTN, HLD, and bilateral clubfoot who originally presented to an urgent care center 12/16 with fatigue, anorexia, and loss of taste and was ultimately found to be Covid positive. Since that time he has developed increasing shortness of breath with refractory cough. He presented to the [facility] ED 12/20 where a CXR noted bilateral pulmonary infiltrates and he required 10 L high flow nasal cannula oxygen to keep his saturations in the mid 90s.  Significant Events: 12/16 Covid positive at [facility] 12/20 admit via [place] - transfer to [facility]  COVID-19 specific Treatment: Remdesivir 12/20 > 12/24 Decadron 12/20 > 12/28  Antimicrobials:  Azithromycin 12/20 > 12/24 Ceftriaxone 12/20 > 12/24  Subjective: Clinically the patient is doing very well.  He was able to ambulate today and did not require oxygen while doing so.  His saturations remained stable.  He is quite fearful about going home because he does still feel weak.  He tells me he has no way to get into his home until late tonight and asked to be allowed 1 more night to assure that he has a safe environment into which to be discharged.  I have assured him that I feel this is completely reasonable.  Assessment & Plan:  COVID Pneumonia -acute hypoxic respiratory failure Has completed a course of remdesivir and Decadron -clinically much improved -has been successfully weaned to room air  DM 2 uncontrolled with hyperglycemia CBG much better controlled with discontinuation of steroid -hopeful to return to usual home regimen at time of discharge  HTN BP controlled  CKD stage II crt stable   Hyponatremia Sodium is holding steady at this time  Hepatic steatosis Noted on CT chest - will need outpt f/u   HLD Cont home med tx   BPH Cont home med tx   Obesity - Estimated body mass  index is 36.49 kg/m as calculated from the following:   Height as of this encounter: 5\' 8"  (2.951 m).   Weight as of this encounter: 108.9 kg.   DVT prophylaxis: Lovenox  Code Status: FULL CODE Family Communication:  Disposition Plan: MedSurg bed - ambulate - discharge home 12/30  Consultants:  none  Objective: Blood pressure 128/78, pulse 72, temperature 97.6 F (36.4 C), temperature source Oral, resp. rate 18, height 5\' 8"  (1.727 m), weight 108.9 kg, SpO2 95 %.  Intake/Output Summary (Last 24 hours) at 05/08/2019 1245 Last data filed at 05/08/2019 0330 Gross per 24 hour  Intake --  Output 900 ml  Net -900 ml   Filed Weights   04/28/19 0919  Weight: 108.9 kg    Examination: General: No acute respiratory distress  Lungs: Clear to auscultation Cardiovascular: RRR Abdomen: NT/ND, soft, bs+, no mass  Extremities: trace B LE edema    CBC: Recent Labs  Lab 05/02/19 0300 05/03/19 1127 05/05/19 0043 05/06/19 0641 05/07/19 0146  WBC 14.0* 14.0* 21.2* 20.6* 21.4*  NEUTROABS 11.2* 12.1*  --   --   --   HGB 12.8* 13.9 13.0 13.3 12.8*  HCT 41.5 45.6 42.6 43.5 41.4  MCV 67.8* 68.8* 67.7* 68.6* 67.9*  PLT 422* 434* 465* 445* 884*   Basic Metabolic Panel: Recent Labs  Lab 05/02/19 0300 05/03/19 1127 05/05/19 0043 05/06/19 0641 05/07/19 0146  NA 139 135 132* 133* 131*  K 4.6 5.8* 5.2* 5.2* 4.8  CL 107  100 101 99 98  CO2 22 23 23 25 24   GLUCOSE 182* 305* 198* 160* 199*  BUN 33* 33* 37* 32* 35*  CREATININE 1.00 1.00 1.06 0.98 0.93  CALCIUM 8.8* 8.8* 9.0 9.1 9.1  MG 2.7* 2.7*  --   --   --   PHOS 3.2 3.8  --   --   --    GFR: Estimated Creatinine Clearance: 98.5 mL/min (by C-G formula based on SCr of 0.93 mg/dL).  Liver Function Tests: Recent Labs  Lab 05/02/19 0300 05/03/19 1127 05/04/19 0720 05/07/19 0146  AST 28 36 27 28  ALT 32 33 31 53*  ALKPHOS 63 68 63 63  BILITOT 0.4 0.8 0.4 0.7  PROT 8.1 9.2* 8.1 7.8  ALBUMIN 2.5* 3.0* 2.5* 2.8*     HbA1C: Hemoglobin A1C  Date/Time Value Ref Range Status  12/06/2018 05:03 PM 7.4 (A) 4.0 - 5.6 % Final  06/05/2018 04:44 PM 9.7 (A) 4.0 - 5.6 % Final   Hgb A1c MFr Bld  Date/Time Value Ref Range Status  04/29/2019 02:34 AM 9.2 (H) 4.8 - 5.6 % Final    Comment:    (NOTE) Pre diabetes:          5.7%-6.4% Diabetes:              >6.4% Glycemic control for   <7.0% adults with diabetes   03/22/2016 04:13 PM 10.4 (H) 4.8 - 5.6 % Final    Comment:             Pre-diabetes: 5.7 - 6.4          Diabetes: >6.4          Glycemic control for adults with diabetes: <7.0     CBG: Recent Labs  Lab 05/07/19 1141 05/07/19 1617 05/07/19 2052 05/08/19 0809 05/08/19 1230  GLUCAP 182* 241* 193* 117* 186*    Recent Results (from the past 240 hour(s))  Blood Culture (routine x 2)     Status: None   Collection Time: 04/28/19  3:54 PM   Specimen: BLOOD  Result Value Ref Range Status   Specimen Description BLOOD LEFT ANTECUBITAL  Final   Special Requests   Final    BOTTLES DRAWN AEROBIC AND ANAEROBIC Blood Culture results may not be optimal due to an inadequate volume of blood received in culture bottles   Culture   Final    NO GROWTH 5 DAYS Performed at [facility], [address], [place], [place]    Report Status 05/03/2019 FINAL  Final     Scheduled Meds: . allopurinol  100 mg Oral Daily  . amLODipine  5 mg Oral Daily  . aspirin EC  81 mg Oral Daily  . carvedilol  12.5 mg Oral BID  . enoxaparin (LOVENOX) injection  55 mg Subcutaneous Daily  . insulin aspart  0-20 Units Subcutaneous TID WC  . insulin aspart  0-5 Units Subcutaneous QHS  . insulin glargine  14 Units Subcutaneous BID  . linagliptin  5 mg Oral Daily  . multivitamin with minerals  1 tablet Oral Daily  . Ensure Max Protein  11 oz Oral QHS  . rosuvastatin  20 mg Oral Daily  . tamsulosin  0.4 mg Oral Daily      LOS: 10 days   05/05/2019, MD [facility] Office   [phone] Pager - Text Page per Amion  If 7PM-7AM, please contact night-coverage per Amion 05/08/2019, 12:45 PM

## 2019-05-09 LAB — GLUCOSE, CAPILLARY
Glucose-Capillary: 132 mg/dL — ABNORMAL HIGH (ref 70–99)
Glucose-Capillary: 159 mg/dL — ABNORMAL HIGH (ref 70–99)
Glucose-Capillary: 171 mg/dL — ABNORMAL HIGH (ref 70–99)
Glucose-Capillary: 207 mg/dL — ABNORMAL HIGH (ref 70–99)

## 2019-05-09 NOTE — Plan of Care (Signed)
Went over [place] with pt and family as requested by pt. All questions asked and all questions answered.

## 2019-05-09 NOTE — Progress Notes (Signed)
Pt stable with no signs of distress. Report given to oncoming nurse. Safety maintained.  

## 2019-05-09 NOTE — Progress Notes (Signed)
Daniel Perkins  YOV:[id number] DOB: 1955/10/21 DOA: 04/28/2019 PCP: Eliezer Bottom, MD    Brief Narrative:  63yo with a history of DM, CAD, HTN, HLD, and bilateral clubfoot who originally presented to an urgent care center 12/16 with fatigue, anorexia, and loss of taste and was ultimately found to be Covid positive. Since that time he has developed increasing shortness of breath with refractory cough. He presented to the [facility] ED 12/20 where a CXR noted bilateral pulmonary infiltrates and he required 10 L high flow nasal cannula oxygen to keep his saturations in the mid 90s.  Significant Events: 12/16 Covid positive at [facility] 12/20 admit via [place] - transfer to [facility]  COVID-19 specific Treatment: Remdesivir 12/20 > 12/24 Decadron 12/20 > 12/28  Antimicrobials:  Azithromycin 12/20 > 12/24 Ceftriaxone 12/20 > 12/24  Subjective: No new complaints.  Resting comfortably in a bedside chair.  Feels very weak in general.  Does not feel safe ambulating due to weakness and instability on feet.  Does not have a safe environment to which he can be safely discharged at present.  Assessment & Plan:  COVID Pneumonia -acute hypoxic respiratory failure Has completed a course of remdesivir and Decadron -clinically much improved -has been successfully weaned to room air  DM 2 uncontrolled with hyperglycemia CBG much better controlled with discontinuation of steroid -hopeful to return to usual home regimen at time of discharge  HTN BP controlled  CKD stage II crt stable   Hyponatremia Sodium is holding steady at this time  Hepatic steatosis Noted on CT chest - will need outpt f/u   HLD Cont home med tx   BPH Cont home med tx   Obesity - Estimated body mass index is 36.49 kg/m as calculated from the following:   Height as of this encounter: 5\' 8"  (1.727 m).   Weight as of this encounter: 108.9 kg.   DVT prophylaxis: Lovenox  Code Status: FULL CODE Family  Communication:  Disposition Plan: MedSurg bed - ambulate - pursue SNF rehab stay   Consultants:  none  Objective: Blood pressure 112/85, pulse 84, temperature 97.8 F (36.6 C), temperature source Oral, resp. rate 16, height 5\' 8"  (1.727 m), weight 108.9 kg, SpO2 95 %.  Intake/Output Summary (Last 24 hours) at 05/09/2019 1042 Last data filed at 05/09/2019 0700 Gross per 24 hour  Intake -  Output 850 ml  Net -850 ml   Filed Weights   04/28/19 0919  Weight: 108.9 kg    Examination: General: No acute respiratory distress  Lungs: Clear to auscultation Cardiovascular: RRR Abdomen: NT/ND, soft, bs+, no mass  Extremities: trace B LE edema    CBC: Recent Labs  Lab 05/03/19 1127 05/05/19 0043 05/06/19 0641 05/07/19 0146  WBC 14.0* 21.2* 20.6* 21.4*  NEUTROABS 12.1*  --   --   --   HGB 13.9 13.0 13.3 12.8*  HCT 45.6 42.6 43.5 41.4  MCV 68.8* 67.7* 68.6* 67.9*  PLT 434* 465* 445* 448*   Basic Metabolic Panel: Recent Labs  Lab 05/03/19 1127 05/05/19 0043 05/06/19 0641 05/07/19 0146  NA 135 132* 133* 131*  K 5.8* 5.2* 5.2* 4.8  CL 100 101 99 98  CO2 23 23 25 24   GLUCOSE 305* 198* 160* 199*  BUN 33* 37* 32* 35*  CREATININE 1.00 1.06 0.98 0.93  CALCIUM 8.8* 9.0 9.1 9.1  MG 2.7*  --   --   --   PHOS 3.8  --   --   --  GFR: Estimated Creatinine Clearance: 98.5 mL/min (by C-G formula based on SCr of 0.93 mg/dL).  Liver Function Tests: Recent Labs  Lab 05/03/19 1127 05/04/19 0720 05/07/19 0146  AST 36 27 28  ALT 33 31 53*  ALKPHOS 68 63 63  BILITOT 0.8 0.4 0.7  PROT 9.2* 8.1 7.8  ALBUMIN 3.0* 2.5* 2.8*    HbA1C: Hemoglobin A1C  Date/Time Value Ref Range Status  12/06/2018 05:03 PM 7.4 (A) 4.0 - 5.6 % Final  06/05/2018 04:44 PM 9.7 (A) 4.0 - 5.6 % Final   Hgb A1c MFr Bld  Date/Time Value Ref Range Status  04/29/2019 02:34 AM 9.2 (H) 4.8 - 5.6 % Final    Comment:    (NOTE) Pre diabetes:          5.7%-6.4% Diabetes:              >6.4% Glycemic  control for   <7.0% adults with diabetes   03/22/2016 04:13 PM 10.4 (H) 4.8 - 5.6 % Final    Comment:             Pre-diabetes: 5.7 - 6.4          Diabetes: >6.4          Glycemic control for adults with diabetes: <7.0     CBG: Recent Labs  Lab 05/08/19 0809 05/08/19 1230 05/08/19 1653 05/08/19 2018 05/09/19 0811  GLUCAP 117* 186* 189* 192* 132*    No results found for this or any previous visit (from the past 240 hour(s)).   Scheduled Meds: . allopurinol  100 mg Oral Daily  . amLODipine  5 mg Oral Daily  . aspirin EC  81 mg Oral Daily  . carvedilol  12.5 mg Oral BID  . enoxaparin (LOVENOX) injection  55 mg Subcutaneous Daily  . insulin aspart  0-20 Units Subcutaneous TID WC  . insulin aspart  0-5 Units Subcutaneous QHS  . insulin glargine  14 Units Subcutaneous BID  . linagliptin  5 mg Oral Daily  . multivitamin with minerals  1 tablet Oral Daily  . Ensure Max Protein  11 oz Oral QHS  . rosuvastatin  20 mg Oral Daily  . tamsulosin  0.4 mg Oral Daily      LOS: 11 days   Cherene Altes, MD [facility] Office  [phone] Pager - Text Page per Amion  If 7PM-7AM, please contact night-coverage per Amion 05/09/2019, 10:42 AM

## 2019-05-09 NOTE — Progress Notes (Signed)
Physical Therapy Treatment Patient Details Name: Daniel Perkins MRN: [id number] DOB: 1956-04-22 Today's Date: 05/09/2019    History of Present Illness Pt is a 63 y.o. male admitted 04/28/19 with worsening SOB and cough; initial (+) COVID-19 on 04/24/19. PMH includes DM2, HTN, CKD II, bilateral club foot.    PT Comments    Patient assessed today for orthostatic BP and safety for DC home alone.  Supine 120/82 HR 94 Sit         112/73 Stand     88/58  110 3" stand  99/74 117 amb 30' 135/88 115 amb 120 118/68 112  resting in recliner 119/75 100/  SPO2 on  RA > 95% throughout. Noted 3/4 dyspnea after ambulation The patient is unsteady in room ambulation without RW. Patient will be home alone , family unable to stay. Currently, patient is deconditioned ,has low BP initially standing and requires UE support for safe ambulation. Patient can benefit from Short term rehab to return to Independent  Level without AD. Continue PT for progressive mobility.  Follow Up Recommendations  SNF;Supervision/Assistance - 24 hour     Equipment Recommendations  Rolling walker with 5" wheels    Recommendations for Other Services       Precautions / Restrictions Precautions Precautions: Fall;Other (comment) Precaution Comments: Monitor BP, pt orthostatic during todays session    Mobility  Bed Mobility Overal bed mobility: Independent                Transfers Overall transfer level: Needs assistance Equipment used: Rolling walker (2 wheeled) Transfers: Sit to/from Stand Sit to Stand: Min guard         General transfer comment: stood from bed without RW, extra effort and slow to steady self.  Ambulation/Gait Ambulation/Gait assistance: Min guard Gait Distance (Feet): 30 Feet Assistive device: None Gait Pattern/deviations: Step-through pattern;Decreased stride length;Staggering right;Staggering left Gait velocity: decr   General Gait Details: Tested patient's balance and  safety in room without RW. Gait is wide base, Patient reaches for any object to stabilize, gait is very slow and determined. patiewnt complains of dizziness. Patient then ambulated x 120' with RW and min/min guard due to complaints of dizziness.   Stairs             Wheelchair Mobility    Modified Rankin (Stroke Patients Only)       Balance     Sitting balance-Leahy Scale: Good     Standing balance support: No upper extremity supported;During functional activity Standing balance-Leahy Scale: Poor Standing balance comment: reliant on at least 1 UE to support when moving.                            Cognition Arousal/Alertness: Awake/alert Behavior During Therapy: WFL for tasks assessed/performed                                   General Comments: Endorses his concern to get home and be able to manage all his ADL's/      Exercises      General Comments        Pertinent Vitals/Pain Pain Assessment: No/denies pain    Home Living                      Prior Function            PT Goals (current  goals can now be found in the care plan section) Progress towards PT goals: Progressing toward goals    Frequency    Min 3X/week      PT Plan Discharge plan needs to be updated    Co-evaluation              AM-PAC PT "6 Clicks" Mobility   Outcome Measure  Help needed turning from your back to your side while in a flat bed without using bedrails?: None Help needed moving from lying on your back to sitting on the side of a flat bed without using bedrails?: None Help needed moving to and from a bed to a chair (including a wheelchair)?: A Little Help needed standing up from a chair using your arms (e.g., wheelchair or bedside chair)?: A Little Help needed to walk in hospital room?: A Little Help needed climbing 3-5 steps with a railing? : A Lot 6 Click Score: 19    End of Session Equipment Utilized During Treatment:  Gait belt Activity Tolerance: Patient tolerated treatment well Patient left: in chair;with call bell/phone within reach Nurse Communication: Mobility status PT Visit Diagnosis: Other abnormalities of gait and mobility (R26.89);Difficulty in walking, not elsewhere classified (R26.2)     Time: 6759-1638 PT Time Calculation (min) (ACUTE ONLY): 52 min  Charges:  $Gait Training: 23-37 mins $Self Care/Home Management: 8-22                     Blanchard Kelch PT Acute Rehabilitation Services Pager [phone] Office [phone]    Rada Hay 05/09/2019, 9:50 AM

## 2019-05-09 NOTE — NC FL2 (Signed)
[place] MEDICAID FL2 LEVEL OF CARE SCREENING TOOL     IDENTIFICATION  Patient Name: Daniel Perkins Birthdate: 12/08/1955 Sex: male Admission Date (Current Location): 04/28/2019  [facility] and [place] Number:  Producer, television/film/video and Address:  [facility], [address], [place], [place] [address]      Provider Number: [id number]  Attending Physician Name and Address:  Lonia Blood, MD  Relative Name and Phone Number:  Sotirios Navarro  [phone]    Current Level of Care: Hospital Recommended Level of Care: Skilled Nursing Facility Prior Approval Number:    Date Approved/Denied:   PASRR Number: [id number]  Discharge Plan: SNF    Current Diagnoses: Patient Active Problem List   Diagnosis Date Noted  . CKD (chronic kidney disease), 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  . Hepatic steatosis 05/01/2019  . Acute respiratory disease due to COVID-19 virus 04/28/2019  . Prostate hypertrophy 12/06/2018  . Ingrown toenail 06/06/2018  . Rotator cuff tendonitis, left 02/14/2017  . Toe cyanosis 02/14/2017  . Skin lesion 03/23/2016  . Pulsatile mass of left upper extremity 06/19/2015  . Sleep apnea-like behavior 10/01/2014  . Chest pain 09/24/2014  . Peripheral vascular disease (HCC) 02/10/2014  . Microcytic anemia 02/06/2013  . CTEV (congenital talipes equinovarus)   . Statin intolerance 06/29/2012  . Esophageal reflux 06/29/2012  . Health care maintenance 06/28/2012  . Gout 06/12/2012  . Obesity, Class II, BMI 35-39.9 02/20/2012  . DOE (dyspnea on exertion) 02/20/2012  . SOB (shortness of breath) 08/18/2010  . Uncontrolled diabetes mellitus with complications (HCC) 09/06/2009  . ERECTILE DYSFUNCTION, NON-ORGANIC 02/16/2009  . Hyperlipemia 01/14/2009  . HYPERTENSION 01/14/2009    Orientation RESPIRATION BLADDER Height & Weight     Self, Time, Situation, Place  Normal Continent Weight: 108.9 kg Height:  5\' 8"  (172.7 cm)  BEHAVIORAL  SYMPTOMS/MOOD NEUROLOGICAL BOWEL NUTRITION STATUS  Other (Comment)(N/A) (N/A) Continent Diet(see discharge summary)  AMBULATORY STATUS COMMUNICATION OF NEEDS Skin   Limited Assist Verbally Normal                       Personal Care Assistance Level of Assistance  Bathing, Feeding, Dressing Bathing Assistance: Limited assistance Feeding assistance: Independent Dressing Assistance: Limited assistance     Functional Limitations Info  Sight, Hearing, Speech Sight Info: Adequate Hearing Info: Adequate Speech Info: Adequate    SPECIAL CARE FACTORS FREQUENCY  PT (By licensed PT), OT (By licensed OT)     PT Frequency: Min 3X/week OT Frequency: Min 3X/week            Contractures Contractures Info: Not present    Additional Factors Info  Code Status, Allergies, Isolation Precautions, Insulin Sliding Scale Code Status Info: Full Code Allergies Info: Morphine; Pravastatin Sodium   Insulin Sliding Scale Info: 3 times daily with meals Isolation Precautions Info: COVID-19 + (Droplet/Contact)     Current Medications (05/09/2019):  This is the current hospital active medication list Current Facility-Administered Medications  Medication Dose Route Frequency Provider Last Rate Last Admin  . acetaminophen (TYLENOL) tablet 650 mg  650 mg Oral Q6H PRN 05/11/2019, MD   650 mg at 05/03/19 0605  . albuterol (VENTOLIN HFA) 108 (90 Base) MCG/ACT inhaler 2 puff  2 puff Inhalation Q4H PRN 0606, MD   2 puff at 05/01/19 2250  . allopurinol (ZYLOPRIM) tablet 100 mg  100 mg Oral Daily 05/03/19, MD   100 mg at 05/09/19 0850  . amLODipine (NORVASC) tablet 5  mg  5 mg Oral Daily Karmen Bongo, MD   5 mg at 05/09/19 0850  . aspirin EC tablet 81 mg  81 mg Oral Daily Karmen Bongo, MD   81 mg at 05/09/19 0254  . bisacodyl (DULCOLAX) EC tablet 5 mg  5 mg Oral Daily PRN Karmen Bongo, MD      . carvedilol (COREG) tablet 12.5 mg  12.5 mg Oral BID Karmen Bongo, MD   12.5  mg at 05/09/19 2706  . enoxaparin (LOVENOX) injection 55 mg  55 mg Subcutaneous Daily Cherene Altes, MD   55 mg at 05/09/19 1003  . guaiFENesin-dextromethorphan (ROBITUSSIN DM) 100-10 MG/5ML syrup 5 mL  5 mL Oral Q4H PRN Adefeso, Oladapo, DO   5 mL at 05/01/19 2127  . influenza vac split quadrivalent PF (FLUARIX) injection 0.5 mL  0.5 mL Intramuscular Prior to discharge Joette Catching T, MD      . insulin aspart (novoLOG) injection 0-20 Units  0-20 Units Subcutaneous TID WC Cherene Altes, MD   4 Units at 05/09/19 1207  . insulin aspart (novoLOG) injection 0-5 Units  0-5 Units Subcutaneous QHS Cherene Altes, MD   3 Units at 05/05/19 2142  . insulin glargine (LANTUS) injection 14 Units  14 Units Subcutaneous BID Cherene Altes, MD   14 Units at 05/09/19 1002  . linagliptin (TRADJENTA) tablet 5 mg  5 mg Oral Daily Cherene Altes, MD   5 mg at 05/09/19 0850  . lip balm (CARMEX) ointment   Topical PRN Annita Brod, MD   1 application at 23/76/28 1636  . multivitamin with minerals tablet 1 tablet  1 tablet Oral Daily Cherene Altes, MD   1 tablet at 05/09/19 [phone]  . ondansetron (ZOFRAN) tablet 4 mg  4 mg Oral Q6H PRN Karmen Bongo, MD       Or  . ondansetron [facility]) injection 4 mg  4 mg Intravenous Q6H PRN Karmen Bongo, MD      . oxyCODONE (Oxy IR/ROXICODONE) immediate release tablet 5 mg  5 mg Oral Q4H PRN Karmen Bongo, MD      . polyethylene glycol (MIRALAX / GLYCOLAX) packet 17 g  17 g Oral Daily PRN Karmen Bongo, MD   17 g at 04/30/19 2037  . protein supplement (ENSURE MAX) liquid  11 oz Oral QHS Cherene Altes, MD   11 oz at 05/08/19 2156  . rosuvastatin (CRESTOR) tablet 20 mg  20 mg Oral Daily Karmen Bongo, MD   20 mg at 05/09/19 1002  . sodium phosphate (FLEET) 7-19 GM/118ML enema 1 enema  1 enema Rectal Once PRN Karmen Bongo, MD      . tamsulosin [facility]) capsule 0.4 mg  0.4 mg Oral Daily Karmen Bongo, MD   0.4 mg at 05/09/19 7616      Discharge Medications: Please see discharge summary for a list of discharge medications.  Relevant Imaging Results:  Relevant Lab Results:   Additional Information SSN: [id number]  Midge Minium MSN, RN, NCM-BC, ACM-RN [phone]

## 2019-05-10 LAB — GLUCOSE, CAPILLARY
Glucose-Capillary: 136 mg/dL — ABNORMAL HIGH (ref 70–99)
Glucose-Capillary: 124 mg/dL — ABNORMAL HIGH (ref 70–99)
Glucose-Capillary: 162 mg/dL — ABNORMAL HIGH (ref 70–99)
Glucose-Capillary: 170 mg/dL — ABNORMAL HIGH (ref 70–99)

## 2019-05-10 NOTE — Progress Notes (Signed)
Daniel Perkins  IDP:[id number] DOB: 10/09/1955 DOA: 04/28/2019 PCP: Harvie Heck, MD    Brief Narrative:  64yo with a history of DM, CAD, HTN, HLD, and bilateral clubfoot who originally presented to an urgent care center 12/16 with fatigue, anorexia, and loss of taste and was ultimately found to be Covid positive. Since that time he has developed increasing shortness of breath with refractory cough. He presented to the [facility] ED 12/20 where a CXR noted bilateral pulmonary infiltrates and he required 10 L high flow nasal cannula oxygen to keep his saturations in the mid 90s.  Significant Events: 12/16 Covid positive at [facility] 12/20 admit via [place] - transfer to [facility]  COVID-19 specific Treatment: Remdesivir 12/20 > 12/24 Decadron 12/20 > 12/28  Antimicrobials:  Azithromycin 12/20 > 12/24 Ceftriaxone 12/20 > 12/24  Subjective: No new complaints today.  Presently remains too weak for safe discharge home where he has no assistance.  Assessment & Plan:  COVID Pneumonia -acute hypoxic respiratory failure Has completed a course of remdesivir and Decadron -clinically much improved -has been successfully weaned to room air  DM 2 uncontrolled with hyperglycemia CBG much better controlled with discontinuation of steroid -hopeful to return to usual home regimen at time of discharge  HTN BP controlled  CKD stage II crt stable   Hyponatremia Sodium has been holding steady - recheck in AM  Hepatic steatosis Noted on CT chest - will need outpt f/u   HLD Cont home med tx   BPH Cont home med tx   Obesity - Estimated body mass index is 36.49 kg/m as calculated from the following:   Height as of this encounter: 5\' 8"  (1.727 m).   Weight as of this encounter: 108.9 kg.   DVT prophylaxis: Lovenox  Code Status: FULL CODE Family Communication:  Disposition Plan: MedSurg bed - ambulate - pursue SNF rehab stay   Consultants:  none  Objective: Blood pressure  106/79, pulse 84, temperature (!) 97.4 F (36.3 C), temperature source Axillary, resp. rate 16, height 5\' 8"  (1.727 m), weight 108.9 kg, SpO2 96 %.  Intake/Output Summary (Last 24 hours) at 05/10/2019 1657 Last data filed at 05/10/2019 1600 Gross per 24 hour  Intake 1340 ml  Output 1750 ml  Net -410 ml   Filed Weights   04/28/19 0919  Weight: 108.9 kg    Examination: General: No acute respiratory distress  Lungs: CTA Cardiovascular: RRR Extremities: trace B LE edema    CBC: Recent Labs  Lab 05/05/19 0043 05/06/19 0641 05/07/19 0146  WBC 21.2* 20.6* 21.4*  HGB 13.0 13.3 12.8*  HCT 42.6 43.5 41.4  MCV 67.7* 68.6* 67.9*  PLT 465* 445* 443*   Basic Metabolic Panel: Recent Labs  Lab 05/05/19 0043 05/06/19 0641 05/07/19 0146  NA 132* 133* 131*  K 5.2* 5.2* 4.8  CL 101 99 98  CO2 23 25 24   GLUCOSE 198* 160* 199*  BUN 37* 32* 35*  CREATININE 1.06 0.98 0.93  CALCIUM 9.0 9.1 9.1   GFR: Estimated Creatinine Clearance: 98.5 mL/min (by C-G formula based on SCr of 0.93 mg/dL).  Liver Function Tests: Recent Labs  Lab 05/04/19 0720 05/07/19 0146  AST 27 28  ALT 31 53*  ALKPHOS 63 63  BILITOT 0.4 0.7  PROT 8.1 7.8  ALBUMIN 2.5* 2.8*    HbA1C: Hemoglobin A1C  Date/Time Value Ref Range Status  12/06/2018 05:03 PM 7.4 (A) 4.0 - 5.6 % Final  06/05/2018 04:44 PM 9.7 (A) 4.0 -  5.6 % Final   Hgb A1c MFr Bld  Date/Time Value Ref Range Status  04/29/2019 02:34 AM 9.2 (H) 4.8 - 5.6 % Final    Comment:    (NOTE) Pre diabetes:          5.7%-6.4% Diabetes:              >6.4% Glycemic control for   <7.0% adults with diabetes   03/22/2016 04:13 PM 10.4 (H) 4.8 - 5.6 % Final    Comment:             Pre-diabetes: 5.7 - 6.4          Diabetes: >6.4          Glycemic control for adults with diabetes: <7.0     CBG: Recent Labs  Lab 05/09/19 1706 05/09/19 2032 05/10/19 0804 05/10/19 1140 05/10/19 1604  GLUCAP 171* 207* 136* 124* 162*    No results found for  this or any previous visit (from the past 240 hour(s)).   Scheduled Meds: . allopurinol  100 mg Oral Daily  . amLODipine  5 mg Oral Daily  . aspirin EC  81 mg Oral Daily  . carvedilol  12.5 mg Oral BID  . enoxaparin (LOVENOX) injection  55 mg Subcutaneous Daily  . insulin aspart  0-20 Units Subcutaneous TID WC  . insulin aspart  0-5 Units Subcutaneous QHS  . insulin glargine  14 Units Subcutaneous BID  . linagliptin  5 mg Oral Daily  . multivitamin with minerals  1 tablet Oral Daily  . Ensure Max Protein  11 oz Oral QHS  . rosuvastatin  20 mg Oral Daily  . tamsulosin  0.4 mg Oral Daily      LOS: 12 days   Lonia Blood, MD [facility] Office  [phone] Pager - Text Page per Amion  If 7PM-7AM, please contact night-coverage per Amion 05/10/2019, 4:57 PM

## 2019-05-10 NOTE — Plan of Care (Signed)
Went over POC. All questions asked and all questions answered. 

## 2019-05-11 LAB — BASIC METABOLIC PANEL
Anion gap: 11 (ref 5–15)
BUN: 26 mg/dL — ABNORMAL HIGH (ref 8–23)
CO2: 24 mmol/L (ref 22–32)
Calcium: 8.9 mg/dL (ref 8.9–10.3)
Chloride: 97 mmol/L — ABNORMAL LOW (ref 98–111)
Creatinine, Ser: 0.95 mg/dL (ref 0.61–1.24)
GFR calc Af Amer: >60 mL/min (ref >60)
GFR calc non Af Amer: >60 mL/min (ref >60)
Glucose, Bld: 119 mg/dL — ABNORMAL HIGH (ref 70–99)
Potassium: 4.5 mmol/L (ref 3.5–5.1)
Sodium: 132 mmol/L — ABNORMAL LOW (ref 135–145)

## 2019-05-11 LAB — GLUCOSE, CAPILLARY
Glucose-Capillary: 129 mg/dL — ABNORMAL HIGH (ref 70–99)
Glucose-Capillary: 158 mg/dL — ABNORMAL HIGH (ref 70–99)
Glucose-Capillary: 156 mg/dL — ABNORMAL HIGH (ref 70–99)
Glucose-Capillary: 109 mg/dL — ABNORMAL HIGH (ref 70–99)

## 2019-05-11 NOTE — Plan of Care (Signed)
Went over POC with pt. All questions asked and all questions answered. 

## 2019-05-11 NOTE — Progress Notes (Signed)
Daniel Perkins  RSW:[id number] DOB: 1955-10-11 DOA: 04/28/2019 PCP: Harvie Heck, MD    Brief Narrative:  64yo with a history of DM, CAD, HTN, HLD, and bilateral clubfoot who originally presented to an urgent care center 12/16 with fatigue, anorexia, and loss of taste and was ultimately found to be Covid positive. Since that time he has developed increasing shortness of breath with refractory cough. He presented to the [facility] ED 12/20 where a CXR noted bilateral pulmonary infiltrates and he required 10 L high flow nasal cannula oxygen to keep his saturations in the mid 90s.  Significant Events: 12/16 Covid positive at [facility] 12/20 admit via [place] - transfer to [facility]  COVID-19 specific Treatment: Remdesivir 12/20 > 12/24 Decadron 12/20 > 12/28  Antimicrobials:  Azithromycin 12/20 > 12/24 Ceftriaxone 12/20 > 12/24  Subjective: Resting comfortably in a bedside chair.  No new complaints.  Awaiting placement for rehab stay in a SNF.  Assessment & Plan:  COVID Pneumonia -acute hypoxic respiratory failure Has completed a courses of remdesivir and decadron - clinically much improved -has been successfully weaned to room air  DM 2 uncontrolled with hyperglycemia CBG much better controlled with discontinuation of steroid -hopeful to return to usual home regimen at time of discharge  HTN BP controlled  CKD stage II crt stable   Hyponatremia Sodium is holding steady at this time  Hepatic steatosis Noted on CT chest - will need outpt f/u   HLD Cont home med tx   BPH Cont home med tx   Obesity - Estimated body mass index is 36.49 kg/m as calculated from the following:   Height as of this encounter: 5\' 8"  (1.727 m).   Weight as of this encounter: 108.9 kg.   DVT prophylaxis: Lovenox  Code Status: FULL CODE Family Communication:  Disposition Plan: MedSurg bed - ambulate - pursue SNF rehab stay   Consultants:  none  Objective: Blood pressure 106/73,  pulse 79, temperature 98.3 F (36.8 C), temperature source Oral, resp. rate 18, height 5\' 8"  (1.727 m), weight 108.9 kg, SpO2 94 %.  Intake/Output Summary (Last 24 hours) at 05/11/2019 1010 Last data filed at 05/11/2019 0800 Gross per 24 hour  Intake 1500 ml  Output 725 ml  Net 775 ml   Filed Weights   04/28/19 0919  Weight: 108.9 kg    Examination: General: No acute respiratory distress  Lungs: Clear to auscultation Cardiovascular: RRR Abdomen: NT/ND, soft, bs+, no mass  Extremities: No significant edema bilateral lower extremities   CBC: Recent Labs  Lab 05/05/19 0043 05/06/19 0641 05/07/19 0146  WBC 21.2* 20.6* 21.4*  HGB 13.0 13.3 12.8*  HCT 42.6 43.5 41.4  MCV 67.7* 68.6* 67.9*  PLT 465* 445* 093*   Basic Metabolic Panel: Recent Labs  Lab 05/06/19 0641 05/07/19 0146 05/11/19 0425  NA 133* 131* 132*  K 5.2* 4.8 4.5  CL 99 98 97*  CO2 25 24 24   GLUCOSE 160* 199* 119*  BUN 32* 35* 26*  CREATININE 0.98 0.93 0.95  CALCIUM 9.1 9.1 8.9   GFR: Estimated Creatinine Clearance: 96.5 mL/min (by C-G formula based on SCr of 0.95 mg/dL).  Liver Function Tests: Recent Labs  Lab 05/07/19 0146  AST 28  ALT 53*  ALKPHOS 63  BILITOT 0.7  PROT 7.8  ALBUMIN 2.8*    HbA1C: Hemoglobin A1C  Date/Time Value Ref Range Status  12/06/2018 05:03 PM 7.4 (A) 4.0 - 5.6 % Final  06/05/2018 04:44 PM 9.7 (A)  4.0 - 5.6 % Final   Hgb A1c MFr Bld  Date/Time Value Ref Range Status  04/29/2019 02:34 AM 9.2 (H) 4.8 - 5.6 % Final    Comment:    (NOTE) Pre diabetes:          5.7%-6.4% Diabetes:              >6.4% Glycemic control for   <7.0% adults with diabetes   03/22/2016 04:13 PM 10.4 (H) 4.8 - 5.6 % Final    Comment:             Pre-diabetes: 5.7 - 6.4          Diabetes: >6.4          Glycemic control for adults with diabetes: <7.0     CBG: Recent Labs  Lab 05/10/19 0804 05/10/19 1140 05/10/19 1604 05/10/19 2204 05/11/19 0731  GLUCAP 136* 124* 162* 170* 129*     No results found for this or any previous visit (from the past 240 hour(s)).   Scheduled Meds: . allopurinol  100 mg Oral Daily  . amLODipine  5 mg Oral Daily  . aspirin EC  81 mg Oral Daily  . carvedilol  12.5 mg Oral BID  . enoxaparin (LOVENOX) injection  55 mg Subcutaneous Daily  . insulin aspart  0-20 Units Subcutaneous TID WC  . insulin aspart  0-5 Units Subcutaneous QHS  . insulin glargine  14 Units Subcutaneous BID  . linagliptin  5 mg Oral Daily  . multivitamin with minerals  1 tablet Oral Daily  . Ensure Max Protein  11 oz Oral QHS  . rosuvastatin  20 mg Oral Daily  . tamsulosin  0.4 mg Oral Daily      LOS: 13 days   Lonia Blood, MD [facility] Office  [phone] Pager - Text Page per Amion  If 7PM-7AM, please contact night-coverage per Amion 05/11/2019, 10:10 AM

## 2019-05-11 NOTE — Progress Notes (Signed)
   05/11/19 1000  Family/Significant Other Communication  Family/Significant Other Update Other (Comment)   Pt denies wanting me to update his family. States, "I just got off the phone with three of them and talked for about about an hour with each. I think they know what's going on."

## 2019-05-12 LAB — GLUCOSE, CAPILLARY
Glucose-Capillary: 106 mg/dL — ABNORMAL HIGH (ref 70–99)
Glucose-Capillary: 136 mg/dL — ABNORMAL HIGH (ref 70–99)
Glucose-Capillary: 142 mg/dL — ABNORMAL HIGH (ref 70–99)
Glucose-Capillary: 176 mg/dL — ABNORMAL HIGH (ref 70–99)

## 2019-05-12 MED ORDER — COLCHICINE 0.6 MG PO TABS: 0.6000 mg | ORAL_TABLET | Freq: Every day | ORAL | Status: DC | PRN

## 2019-05-12 MED ORDER — COLCHICINE 0.6 MG PO TABS
0.6000 mg | ORAL_TABLET | Freq: Two times a day (BID) | ORAL | Status: DC | PRN
  Administered 2019-05-12 – 2019-05-14 (×5): 0.6 mg via ORAL
  Filled 2019-05-12 (×7): qty 1

## 2019-05-12 NOTE — Progress Notes (Signed)
Daniel Perkins  ZOX:[id number] DOB: 20-Mar-1956 DOA: 04/28/2019 PCP: Harvie Heck, MD    Brief Narrative:  64yo with a history of DM, CAD, HTN, HLD, and bilateral clubfoot who originally presented to an urgent care center 12/16 with fatigue, anorexia, and loss of taste and was ultimately found to be Covid positive. Since that time he has developed increasing shortness of breath with refractory cough. He presented to the [facility] ED 12/20 where a CXR noted bilateral pulmonary infiltrates and he required 10 L high flow nasal cannula oxygen to keep his saturations in the mid 90s.  Significant Events: 12/16 Covid positive at [facility] 12/20 admit via [place] - transfer to [facility]  COVID-19 specific Treatment: Remdesivir 12/20 > 12/24 Decadron 12/20 > 12/28  Antimicrobials:  Azithromycin 12/20 > 12/24 Ceftriaxone 12/20 > 12/24  Subjective: Vital signs stable.  Saturations 95% on room air.  Complains of his typical gout pain flaring up in his right foot.  No other new complaints.  Assessment & Plan:  COVID Pneumonia -acute hypoxic respiratory failure Has completed a courses of remdesivir and decadron - clinically much improved -has been successfully weaned to room air  DM 2 uncontrolled with hyperglycemia CBG much better controlled with discontinuation of steroid -hopeful to return to usual home regimen at time of discharge  HTN BP controlled  CKD stage II crt stable   Hyponatremia Sodium is holding steady at this time  Hepatic steatosis Noted on CT chest - will need outpt f/u   HLD Cont home med tx   BPH Cont home med tx   Obesity - Estimated body mass index is 36.49 kg/m as calculated from the following:   Height as of this encounter: 5\' 8"  (1.727 m).   Weight as of this encounter: 108.9 kg.   DVT prophylaxis: Lovenox  Code Status: FULL CODE Family Communication:  Disposition Plan: MedSurg bed - ambulate -awaiting bed for SNF rehab stay   Consultants:   none  Objective: Blood pressure 108/72, pulse 87, temperature 98.5 F (36.9 C), temperature source Oral, resp. rate 16, height 5\' 8"  (1.727 m), weight 108.9 kg, SpO2 95 %.  Intake/Output Summary (Last 24 hours) at 05/12/2019 0938 Last data filed at 05/12/2019 0745 Gross per 24 hour  Intake 730 ml  Output 1100 ml  Net -370 ml   Filed Weights   04/28/19 0919  Weight: 108.9 kg    Examination: General: No acute respiratory distress  Lungs: Clear to auscultation without wheezing Cardiovascular: RRR Abdomen: NT/ND, soft, bs+, no mass  Extremities: No significant edema bilateral lower extremities -no appreciable erythema or calor of the right foot   CBC: Recent Labs  Lab 05/06/19 0641 05/07/19 0146  WBC 20.6* 21.4*  HGB 13.3 12.8*  HCT 43.5 41.4  MCV 68.6* 67.9*  PLT 445* 811*   Basic Metabolic Panel: Recent Labs  Lab 05/06/19 0641 05/07/19 0146 05/11/19 0425  NA 133* 131* 132*  K 5.2* 4.8 4.5  CL 99 98 97*  CO2 25 24 24   GLUCOSE 160* 199* 119*  BUN 32* 35* 26*  CREATININE 0.98 0.93 0.95  CALCIUM 9.1 9.1 8.9   GFR: Estimated Creatinine Clearance: 96.5 mL/min (by C-G formula based on SCr of 0.95 mg/dL).  Liver Function Tests: Recent Labs  Lab 05/07/19 0146  AST 28  ALT 53*  ALKPHOS 63  BILITOT 0.7  PROT 7.8  ALBUMIN 2.8*    HbA1C: Hemoglobin A1C  Date/Time Value Ref Range Status  12/06/2018 05:03 PM  7.4 (A) 4.0 - 5.6 % Final  06/05/2018 04:44 PM 9.7 (A) 4.0 - 5.6 % Final   Hgb A1c MFr Bld  Date/Time Value Ref Range Status  04/29/2019 02:34 AM 9.2 (H) 4.8 - 5.6 % Final    Comment:    (NOTE) Pre diabetes:          5.7%-6.4% Diabetes:              >6.4% Glycemic control for   <7.0% adults with diabetes   03/22/2016 04:13 PM 10.4 (H) 4.8 - 5.6 % Final    Comment:             Pre-diabetes: 5.7 - 6.4          Diabetes: >6.4          Glycemic control for adults with diabetes: <7.0     CBG: Recent Labs  Lab 05/11/19 0731 05/11/19 1148  05/11/19 1554 05/11/19 2105 05/12/19 0721  GLUCAP 129* 158* 156* 109* 106*      Scheduled Meds: . allopurinol  100 mg Oral Daily  . amLODipine  5 mg Oral Daily  . aspirin EC  81 mg Oral Daily  . carvedilol  12.5 mg Oral BID  . enoxaparin (LOVENOX) injection  55 mg Subcutaneous Daily  . insulin aspart  0-20 Units Subcutaneous TID WC  . insulin aspart  0-5 Units Subcutaneous QHS  . insulin glargine  14 Units Subcutaneous BID  . linagliptin  5 mg Oral Daily  . multivitamin with minerals  1 tablet Oral Daily  . Ensure Max Protein  11 oz Oral QHS  . rosuvastatin  20 mg Oral Daily  . tamsulosin  0.4 mg Oral Daily      LOS: 14 days   Lonia Blood, MD [facility] Office  [phone] Pager - Text Page per Amion  If 7PM-7AM, please contact night-coverage per Amion 05/12/2019, 9:38 AM

## 2019-05-13 LAB — GLUCOSE, CAPILLARY
Glucose-Capillary: 116 mg/dL — ABNORMAL HIGH (ref 70–99)
Glucose-Capillary: 150 mg/dL — ABNORMAL HIGH (ref 70–99)
Glucose-Capillary: 125 mg/dL — ABNORMAL HIGH (ref 70–99)
Glucose-Capillary: 159 mg/dL — ABNORMAL HIGH (ref 70–99)

## 2019-05-13 MED ORDER — PRO-STAT SUGAR FREE PO LIQD
30.0000 mL | Freq: Two times a day (BID) | ORAL | Status: DC
  Administered 2019-05-13 – 2019-05-15 (×5): 30 mL via ORAL
  Filled 2019-05-13 (×2): qty 30

## 2019-05-13 NOTE — Progress Notes (Signed)
Nutrition Follow-up  DOCUMENTATION CODES:   Obesity unspecified  INTERVENTION:   Continue Ensure Max po daily, each supplement provides 150 kcal and 30 grams of protein.   Add 30 ml Prostat BID, each supplement provides 100 kcals and 15 grams protein.   Continue MVI with Minerals  NUTRITION DIAGNOSIS:   Increased nutrient needs related to acute illness as evidenced by estimated needs.  Being addressed via supplements  GOAL:   Patient will meet greater than or equal to 90% of their needs  Progressing  MONITOR:   PO intake, Supplement acceptance, Labs, Weight trends  REASON FOR ASSESSMENT:   Malnutrition Screening Tool    ASSESSMENT:   64 year-old with a history of DM, CAD, HTN, HLD, and bilateral clubfoot. He initially presented to Urgent Care on 12/16 with fatigue, anorexia, and loss of taste and was ultimately found to be COVID positive. Since that time, he has developed increasing SOB with refractory cough. He presented to the [facility] ED 12/20 where CXR noted bilateral pulmonary infiltrates and he required 10L HFNC to keep his saturations in the mid 90s.  RD working remotely.  Recorded po intake 75-100% of meals  Labs: CBGs 106-105 Meds: ss novolog, lantus, MVI with Minerals   Diet Order:   Diet Order            Diet Carb Modified Fluid consistency: Thin; Room service appropriate? Yes  Diet effective now              EDUCATION NEEDS:   No education needs have been identified at this time  Skin:  Skin Assessment: Reviewed RN Assessment  Last BM:  1/3  Height:   Ht Readings from Last 1 Encounters:  04/28/19 5\' 8"  (1.727 m)    Weight:   Wt Readings from Last 1 Encounters:  04/28/19 108.9 kg    Ideal Body Weight:  70 kg  BMI:  Body mass index is 36.49 kg/m.  Estimated Nutritional Needs:   Kcal:  2000-2200 kcal  Protein:  100-120 grams  Fluid:  >/= 2.2 L/day   04/30/19 MS, RDN, LDN, CNSC [phone] Pager  [phone]  Weekend/On-Call Pager

## 2019-05-13 NOTE — Progress Notes (Signed)
Daniel Perkins  KTG:[id number] DOB: January 31, 1956 DOA: 04/28/2019 PCP: Eliezer Bottom, MD    Brief Narrative:  63yo with a history of DM, CAD, HTN, HLD, and bilateral clubfoot who originally presented to an urgent care center 12/16 with fatigue, anorexia, and loss of taste and was ultimately found to be Covid positive. Since that time he has developed increasing shortness of breath with refractory cough. He presented to the [facility] ED 12/20 where a CXR noted bilateral pulmonary infiltrates and he required 10 L high flow nasal cannula oxygen to keep his saturations in the mid 90s.  Significant Events: 12/16 Covid positive at [facility] 12/20 admit via [place] - transfer to [facility]  COVID-19 specific Treatment: Remdesivir 12/20 > 12/24 Decadron 12/20 > 12/28  Antimicrobials:  Azithromycin 12/20 > 12/24 Ceftriaxone 12/20 > 12/24  Subjective: Resting comfortably in a bedside chair.  No new complaints.  States his gout pain is much better controlled with addition of colchicine as needed.  Assessment & Plan:  COVID Pneumonia -acute hypoxic respiratory failure Has completed courses of remdesivir and decadron - clinically much improved - has been successfully weaned to room air  DM 2 uncontrolled with hyperglycemia CBG much better controlled with discontinuation of steroid - hopeful to return to usual home regimen at time of discharge  HTN BP controlled  CKD stage II crt stable   Hyponatremia Sodium is holding steady at this time  Hepatic steatosis Noted on CT chest - will need outpt f/u   HLD Cont home med tx   BPH Cont home med tx   Obesity - Estimated body mass index is 36.49 kg/m as calculated from the following:   Height as of this encounter: 5\' 8"  (1.727 m).   Weight as of this encounter: 108.9 kg.   DVT prophylaxis: Lovenox  Code Status: FULL CODE Family Communication:  Disposition Plan: MedSurg bed - ambulate -awaiting bed for SNF rehab stay    Consultants:  none  Objective: Blood pressure 110/65, pulse 81, temperature 98.3 F (36.8 C), temperature source Oral, resp. rate 18, height 5\' 8"  (1.727 m), weight 108.9 kg, SpO2 97 %.  Intake/Output Summary (Last 24 hours) at 05/13/2019 1014 Last data filed at 05/13/2019 0808 Gross per 24 hour  Intake 240 ml  Output 1625 ml  Net -1385 ml   Filed Weights   04/28/19 0919  Weight: 108.9 kg    Examination: General: No acute respiratory distress  Lungs: Clear to auscultation without wheezing Cardiovascular: RRR   CBC: Recent Labs  Lab 05/07/19 0146  WBC 21.4*  HGB 12.8*  HCT 41.4  MCV 67.9*  PLT 448*   Basic Metabolic Panel: Recent Labs  Lab 05/07/19 0146 05/11/19 0425  NA 131* 132*  K 4.8 4.5  CL 98 97*  CO2 24 24  GLUCOSE 199* 119*  BUN 35* 26*  CREATININE 0.93 0.95  CALCIUM 9.1 8.9   GFR: Estimated Creatinine Clearance: 96.5 mL/min (by C-G formula based on SCr of 0.95 mg/dL).  Liver Function Tests: Recent Labs  Lab 05/07/19 0146  AST 28  ALT 53*  ALKPHOS 63  BILITOT 0.7  PROT 7.8  ALBUMIN 2.8*    HbA1C: Hemoglobin A1C  Date/Time Value Ref Range Status  12/06/2018 05:03 PM 7.4 (A) 4.0 - 5.6 % Final  06/05/2018 04:44 PM 9.7 (A) 4.0 - 5.6 % Final   Hgb A1c MFr Bld  Date/Time Value Ref Range Status  04/29/2019 02:34 AM 9.2 (H) 4.8 - 5.6 % Final  Comment:    (NOTE) Pre diabetes:          5.7%-6.4% Diabetes:              >6.4% Glycemic control for   <7.0% adults with diabetes   03/22/2016 04:13 PM 10.4 (H) 4.8 - 5.6 % Final    Comment:             Pre-diabetes: 5.7 - 6.4          Diabetes: >6.4          Glycemic control for adults with diabetes: <7.0     CBG: Recent Labs  Lab 05/12/19 0721 05/12/19 1150 05/12/19 1657 05/12/19 2024 05/13/19 0812  GLUCAP 106* 136* 142* 176* 116*      Scheduled Meds: . allopurinol  100 mg Oral Daily  . amLODipine  5 mg Oral Daily  . aspirin EC  81 mg Oral Daily  . carvedilol  12.5 mg  Oral BID  . enoxaparin (LOVENOX) injection  55 mg Subcutaneous Daily  . insulin aspart  0-20 Units Subcutaneous TID WC  . insulin aspart  0-5 Units Subcutaneous QHS  . insulin glargine  14 Units Subcutaneous BID  . linagliptin  5 mg Oral Daily  . multivitamin with minerals  1 tablet Oral Daily  . Ensure Max Protein  11 oz Oral QHS  . rosuvastatin  20 mg Oral Daily  . tamsulosin  0.4 mg Oral Daily      LOS: 15 days   Cherene Altes, MD [facility] Office  [phone] Pager - Text Page per Amion  If 7PM-7AM, please contact night-coverage per Amion 05/13/2019, 10:14 AM

## 2019-05-13 NOTE — Progress Notes (Signed)
   05/13/19 1200  Family/Significant Other Communication  Family/Significant Other Update Called;Other (Comment) (pt declined RN to call. pt called sister to update )

## 2019-05-14 LAB — BASIC METABOLIC PANEL
Anion gap: 8 (ref 5–15)
BUN: 23 mg/dL (ref 8–23)
CO2: 24 mmol/L (ref 22–32)
Calcium: 9.2 mg/dL (ref 8.9–10.3)
Chloride: 100 mmol/L (ref 98–111)
Creatinine, Ser: 0.94 mg/dL (ref 0.61–1.24)
GFR calc Af Amer: >60 mL/min (ref >60)
GFR calc non Af Amer: >60 mL/min (ref >60)
Glucose, Bld: 126 mg/dL — ABNORMAL HIGH (ref 70–99)
Potassium: 4.5 mmol/L (ref 3.5–5.1)
Sodium: 132 mmol/L — ABNORMAL LOW (ref 135–145)

## 2019-05-14 LAB — GLUCOSE, CAPILLARY
Glucose-Capillary: 122 mg/dL — ABNORMAL HIGH (ref 70–99)
Glucose-Capillary: 111 mg/dL — ABNORMAL HIGH (ref 70–99)
Glucose-Capillary: 184 mg/dL — ABNORMAL HIGH (ref 70–99)
Glucose-Capillary: 154 mg/dL — ABNORMAL HIGH (ref 70–99)

## 2019-05-14 NOTE — Progress Notes (Signed)
   05/14/19 1319  Family/Significant Other Communication  Family/Significant Other Update Called;Other (Comment) (pt called sister and updated family )

## 2019-05-14 NOTE — TOC Progression Note (Signed)
Transition of Care [facility]) - Progression Note    Patient Details  Name: Daniel Perkins MRN: [id number] Date of Birth: June 20, 1955  [facility] of [facility]) CM/SW Contact  Armanda Heritage, RN Phone Number: 05/14/2019, 4:26 PM  Clinical Narrative:   CM spoke with patient regarding SNF placement.  Patient reports he is waiting for his sister to call him with an update about his father who is being taken to hospice house today.  Patient will make final disposition decision after speaking with his sister. Patient will decide between SNF and returning home with [facility].  CM did inform patient that he had bed offers for snf and went over names of facilities and cms ratings.  Patient states that should he decide to go to SNF, he would like to go to [facility].  Message left for [facility] rep notifying of selection.  [employer] medicare has Production manager in place Berkley Harvey will not need to be obtained prior to dc).       Expected Discharge Plan: Home w Home Health Services Barriers to Discharge: Continued Medical Work up  Expected Discharge Plan and Services Expected Discharge Plan: Home w Home Health Services     Post Acute Care Choice: Durable Medical Equipment, Home Health                   DME Arranged: Oxygen DME Agency: Patsy Lager Date DME Agency Contacted: 05/04/19 Time DME Agency Contacted: 1536 Representative spoke with at DME Agency: Welton Flakes Arranged: PT [facility] Agency: [facility] Date [facility] Agency Contacted: 05/03/19   Representative spoke with at [facility] Agency: Denyse Amass   Social Determinants of Health (SDOH) Interventions    Readmission Risk Interventions No flowsheet data found.

## 2019-05-14 NOTE — Progress Notes (Signed)
PT Cancellation Note  Patient Details Name: Daniel Perkins MRN: [id number] DOB: [birth year]   Cancelled Treatment:    Reason Eval/Treat Not Completed: Pain limiting ability to participate, Patient reports having had a shower today, his gout is flred up now in left elbow. Patient may Dc to [facility] vs home, recommend HHPT and RW if Dc'd home. Patient's father transferred to residential Hospice.   Blanchard Kelch PT Acute Rehabilitation Services Pager [phone] Office [phone]  Rada Hay 05/14/2019, 5:17 PM

## 2019-05-14 NOTE — Progress Notes (Signed)
Daniel Perkins  NFA:[id number] DOB: Jul 01, 1955 DOA: 04/28/2019 PCP: Harvie Heck, MD    Brief Narrative:  64yo with a history of DM, CAD, HTN, HLD, and bilateral clubfoot who originally presented to an urgent care center 12/16 with fatigue, anorexia, and loss of taste and was ultimately found to be Covid positive. Since that time he has developed increasing shortness of breath with refractory cough. He presented to the [facility] ED 12/20 where a CXR noted bilateral pulmonary infiltrates and he required 10 L high flow nasal cannula oxygen to keep his saturations in the mid 90s.  Significant Events: 12/16 Covid positive at [facility] 12/20 admit via [place] - transfer to [facility]  COVID-19 specific Treatment: Remdesivir 12/20 > 12/24 Decadron 12/20 > 12/28  Antimicrobials:  Azithromycin 12/20 > 12/24 Ceftriaxone 12/20 > 12/24  Subjective: Overall the patient is doing well medically.  His gout is flaring slightly in his left elbow but is not terribly problematic.  He has no new physical complaints today.  He was just informed today that his father, who also has Covid, is being moved to [place] and expected to die soon.  He would desperately like to see his father prior to his passing.  This has implications regarding the patient's own disposition, as he has been waiting for SNF placement.  The patient's sister is going to check on his father at the hospice house this afternoon and then contact the patient to formulate a plan.  The patient will let us know if he wishes to be discharged home with home health assistance in order to facilitate a visit with his father, or feels it is best that he proceed with plan for SNF placement.  A bed appears to be ready for the patient to be discharged to an SNF 1/6 if he chooses this option.  Medically he is stable.  He would undoubtedly benefit from a supportive rehabilitative environment such as an SNF right now, but given the  extenuating circumstances I do not think it is unreasonable to give a discharge home a try.  Assessment & Plan:  COVID Pneumonia -acute hypoxic respiratory failure Has completed courses of remdesivir and decadron - clinically much improved - has been successfully weaned to room air  DM 2 uncontrolled with hyperglycemia CBG much better controlled with discontinuation of steroid - plan to return to usual home regimen at time of discharge  HTN BP controlled  CKD stage II crt stable   Hyponatremia Sodium is holding steady at this time  Hepatic steatosis Noted on CT chest - will need outpt f/u   HLD Cont home med tx   BPH Cont home med tx   Obesity - Estimated body mass index is 36.49 kg/m as calculated from the following:   Height as of this encounter: 5\' 8"  (1.727 m).   Weight as of this encounter: 108.9 kg.   DVT prophylaxis: Lovenox  Code Status: FULL CODE Family Communication:  Disposition Plan: MedSurg bed - ambulate -see lengthy discussion above  Consultants:  none  Objective: Blood pressure 116/71, pulse 86, temperature 97.9 F (36.6 C), temperature source Oral, resp. rate 20, height 5\' 8"  (1.727 m), weight 108.9 kg, SpO2 99 %.  Intake/Output Summary (Last 24 hours) at 05/14/2019 1845 Last data filed at 05/14/2019 1100 Gross per 24 hour  Intake --  Output 950 ml  Net -950 ml   Filed Weights   04/28/19 0919  Weight: 108.9 kg    Examination:  General: No acute respiratory distress  Lungs: Clear to auscultation - no wheezing Cardiovascular: RRR   CBC: No results for input(s): WBC, NEUTROABS, HGB, HCT, MCV, PLT in the last 168 hours.   Basic Metabolic Panel: Recent Labs  Lab 05/11/19 0425 05/14/19 0600  NA 132* 132*  K 4.5 4.5  CL 97* 100  CO2 24 24  GLUCOSE 119* 126*  BUN 26* 23  CREATININE 0.95 0.94  CALCIUM 8.9 9.2   GFR: Estimated Creatinine Clearance: 97.5 mL/min (by C-G formula based on SCr of 0.94 mg/dL).   HbA1C: Hemoglobin A1C   Date/Time Value Ref Range Status  12/06/2018 05:03 PM 7.4 (A) 4.0 - 5.6 % Final  06/05/2018 04:44 PM 9.7 (A) 4.0 - 5.6 % Final   Hgb A1c MFr Bld  Date/Time Value Ref Range Status  04/29/2019 02:34 AM 9.2 (H) 4.8 - 5.6 % Final    Comment:    (NOTE) Pre diabetes:          5.7%-6.4% Diabetes:              >6.4% Glycemic control for   <7.0% adults with diabetes   03/22/2016 04:13 PM 10.4 (H) 4.8 - 5.6 % Final    Comment:             Pre-diabetes: 5.7 - 6.4          Diabetes: >6.4          Glycemic control for adults with diabetes: <7.0     CBG: Recent Labs  Lab 05/13/19 1713 05/13/19 2054 05/14/19 0744 05/14/19 1125 05/14/19 1607  GLUCAP 125* 159* 122* 111* 184*      Scheduled Meds: . allopurinol  100 mg Oral Daily  . amLODipine  5 mg Oral Daily  . aspirin EC  81 mg Oral Daily  . carvedilol  12.5 mg Oral BID  . enoxaparin (LOVENOX) injection  55 mg Subcutaneous Daily  . feeding supplement (PRO-STAT SUGAR FREE 64)  30 mL Oral BID  . insulin aspart  0-20 Units Subcutaneous TID WC  . insulin aspart  0-5 Units Subcutaneous QHS  . insulin glargine  14 Units Subcutaneous BID  . linagliptin  5 mg Oral Daily  . multivitamin with minerals  1 tablet Oral Daily  . Ensure Max Protein  11 oz Oral QHS  . rosuvastatin  20 mg Oral Daily  . tamsulosin  0.4 mg Oral Daily      LOS: 16 days   Lonia Blood, MD [facility] Office  [phone] Pager - Text Page per Amion  If 7PM-7AM, please contact night-coverage per Amion 05/14/2019, 6:45 PM

## 2019-05-15 DIAGNOSIS — N400 Enlarged prostate without lower urinary tract symptoms: Secondary | ICD-10-CM

## 2019-05-15 DIAGNOSIS — E118 Type 2 diabetes mellitus with unspecified complications: Secondary | ICD-10-CM

## 2019-05-15 DIAGNOSIS — E1165 Type 2 diabetes mellitus with hyperglycemia: Secondary | ICD-10-CM

## 2019-05-15 DIAGNOSIS — J1282 Pneumonia due to coronavirus disease 2019: Secondary | ICD-10-CM

## 2019-05-15 DIAGNOSIS — Z794 Long term (current) use of insulin: Secondary | ICD-10-CM

## 2019-05-15 DIAGNOSIS — E119 Type 2 diabetes mellitus without complications: Secondary | ICD-10-CM

## 2019-05-15 DIAGNOSIS — J9601 Acute respiratory failure with hypoxia: Secondary | ICD-10-CM

## 2019-05-15 DIAGNOSIS — R0902 Hypoxemia: Secondary | ICD-10-CM

## 2019-05-15 LAB — CBC
HCT: 38.1 % — ABNORMAL LOW (ref 39.0–52.0)
Hemoglobin: 11.7 g/dL — ABNORMAL LOW (ref 13.0–17.0)
MCH: 21.5 pg — ABNORMAL LOW (ref 26.0–34.0)
MCHC: 30.7 g/dL (ref 30.0–36.0)
MCV: 69.9 fL — ABNORMAL LOW (ref 80.0–100.0)
Platelets: 250 10*3/uL (ref 150–400)
RBC: 5.45 MIL/uL (ref 4.22–5.81)
RDW: 15.4 % (ref 11.5–15.5)
WBC: 10.2 10*3/uL (ref 4.0–10.5)
nRBC: 0 % (ref 0.0–0.2)

## 2019-05-15 LAB — GLUCOSE, CAPILLARY
Glucose-Capillary: 139 mg/dL — ABNORMAL HIGH (ref 70–99)
Glucose-Capillary: 133 mg/dL — ABNORMAL HIGH (ref 70–99)

## 2019-05-15 NOTE — Progress Notes (Signed)
Occupational Therapy Treatment Patient Details Name: Daniel Perkins MRN: [id number] DOB: February 14, 1956 Today's Date: 05/15/2019    History of present illness Pt is a 64 y.o. male admitted 04/28/19 with worsening SOB and cough; initial (+) COVID-19 on 04/24/19. PMH includes DM2, HTN, CKD II, bilateral club foot. Dx with COVID PNA, acute hypoxic respiratory failure, hyponatremia.    OT comments  Pt continues to make progress in therapy, demonstrating improved activity tolerance this date. Pt able to ambulate to/from bathroom and stand at the sink 9 min to complete grooming, hygiene, and sponge bathing task. Noted 0 instances of loss of balance throughout. Pt reports no shortness of breath or dizziness. Pt demonstrated good recall and follow through of breathing exercises, energy conservation techniques, and fall prevention strategies. Continued education with pt on additional safety strategies, meal delivery services, activity modifications, and activity pacing techniques to increase balance, safety, independence, and prevent future falls with good understanding. Pt reports that his father (who lived with him) passed away this morning. Consulted with RN as pt is wondering if he can go to the funeral next week. RN to ask MD and update pt. OT will continue to follow acutely.   Follow Up Recommendations  Home health OT;Supervision - Intermittent    Equipment Recommendations  None recommended by OT    Recommendations for Other Services      Precautions / Restrictions Precautions Precautions: Fall Precaution Comments: Pt was asymptomatic today, did not check BP Restrictions Weight Bearing Restrictions: No       Mobility Bed Mobility Overal bed mobility: Independent             General bed mobility comments: Pt seated in bedside chair upon OT arrival.  Transfers Overall transfer level: Needs assistance Equipment used: Rolling walker (2 wheeled);None Transfers: Sit to/from [employer] to Stand: Supervision Stand pivot transfers: Supervision       General transfer comment: Min guard without AD, supervision with RW    Balance Overall balance assessment: Needs assistance Sitting-balance support: Feet supported;No upper extremity supported Sitting balance-Leahy Scale: Normal Sitting balance - Comments: Pt able to donn boots seated EOB   Standing balance support: Bilateral upper extremity supported Standing balance-Leahy Scale: Fair                             ADL either performed or assessed with clinical judgement   ADL Overall ADL's : Needs assistance/impaired     Grooming: Set up;Supervision/safety;Standing;Wash/dry hands;Wash/dry face;Oral care   Upper Body Bathing: Supervision/ safety;Set up;Standing   Lower Body Bathing: Supervison/ safety;Sit to/from stand   Upper Body Dressing : Supervision/safety;Set up;Standing   Lower Body Dressing: Supervision/safety;Set up;Sit to/from stand               Functional mobility during ADLs: Supervision/safety;Set up;Rolling walker General ADL Comments: Pt ambulated to/from bathroom with RW. Pt tolerated standing 9 min at the sink to complete grooming, hygiene, and sponge bathing task.     Vision       Perception     Praxis      Cognition Arousal/Alertness: Awake/alert Behavior During Therapy: WFL for tasks assessed/performed Overall Cognitive Status: Within Functional Limits for tasks assessed                                 General Comments: Not specifically tested  Exercises Exercises: General Upper Extremity;General Lower Extremity;Other exercises General Exercises - Upper Extremity Shoulder ABduction: AROM;Both;10 reps;Seated;Theraband Theraband Level (Shoulder Abduction): Level 1 (Yellow) Shoulder Horizontal ABduction: AROM;Both;10 reps;Seated;Theraband Theraband Level (Shoulder Horizontal Abduction): Level 1 (Yellow) Elbow Flexion:  AROM;Both;10 reps;Seated;Theraband Theraband Level (Elbow Flexion): Level 1 (Yellow) Elbow Extension: AROM;Both;10 reps;Seated;Theraband Theraband Level (Elbow Extension): Level 1 (Yellow) General Exercises - Lower Extremity Long Arc Quad: AROM;Both;10 reps;Seated Hip ABduction/ADduction: AROM;Both;10 reps;Supine Straight Leg Raises: AROM;Both;10 reps;Supine Hip Flexion/Marching: AROM;Both;10 reps;Seated Other Exercises Other Exercises: Visually demonstrated standing exercise program which I encouraged to do his second week home.  Went through and practiced seated T-band UE and LE program and encouraged him to do this his first week depending on how his gout responds to the exercises anywhere from 1-3 times per day.   Other Exercises: Reviewed IS/FV x 10 reps each with IS max inspired volume 1200 mL, cues to slow breath.  Encouraged pt to do theses 10 times per hour for the next two weeks after d/c.     Shoulder Instructions       General Comments Pt ambulated to/from bathroom and stood 9 min at the sink to complete grooming, hygiene, and sponge bathing task. Pt demonstrated good recall and follow through of energy conservation, fall prevention, and relaxation strategies. No reports of SOB throughout.     Pertinent Vitals/ Pain       Pain Assessment: No/denies pain Faces Pain Scale: Hurts even more Pain Location: left elbow and L knee "gout" Pain Descriptors / Indicators: Grimacing;Guarding Pain Intervention(s): Limited activity within patient's tolerance;Monitored during session;Repositioned(RN to bring gout meds)  Home Living                                          Prior Functioning/Environment              Frequency           Progress Toward Goals  OT Goals(current goals can now be found in the care plan section)  Progress towards OT goals: Progressing toward goals  Acute Rehab OT Goals Patient Stated Goal: Return home ADL Goals Pt Will Perform  Grooming: with modified independence;standing Pt Will Perform Lower Body Bathing: with modified independence;sit to/from stand Pt Will Perform Lower Body Dressing: with modified independence;sit to/from stand Pt Will Transfer to Toilet: with modified independence;ambulating;regular height toilet Pt Will Perform Toileting - Clothing Manipulation and hygiene: with modified independence;sit to/from stand Additional ADL Goal #1: Pt to recall and verbalize 3 energy conservation strategies with 0 verbal cues. Additional ADL Goal #2: Pt to tolerate standing up to 5 min independently with SpO2 maintaining above 90%, in preparation for ADLs.  Plan Discharge plan remains appropriate    Co-evaluation                 AM-PAC OT "6 Clicks" Daily Activity     Outcome Measure   Help from another person eating meals?: None Help from another person taking care of personal grooming?: A Little Help from another person toileting, which includes using toliet, bedpan, or urinal?: A Little Help from another person bathing (including washing, rinsing, drying)?: A Little Help from another person to put on and taking off regular upper body clothing?: A Little Help from another person to put on and taking off regular lower body clothing?: A Little 6 Click Score: 19    End of Session  Equipment Utilized During Treatment: Rolling walker      Activity Tolerance Patient tolerated treatment well   Patient Left in chair;with call bell/phone within reach   Nurse Communication Mobility status        Time: 3149-7026 OT Time Calculation (min): 22 min  Charges: OT General Charges $OT Visit: 1 Visit OT Treatments $Self Care/Home Management : 8-22 mins  Peterson Ao OTR/L [phone]    Peterson Ao 05/15/2019, 1:44 PM

## 2019-05-15 NOTE — Progress Notes (Signed)
Patient set to discharge home- walker ordered from [facility] and will be delivered to patients address listed on file. [facility]AR called for transportation.   Patient has not been set up with home health at this time however would prefer to go ahead and discharge at home. Orders have been placed for [facility] and CSW will reach out to [facility] agencies to determine who can accept him/ notify patient once at home.   MD and RN aware.   Stacy Gardner, LCSW Transitions of Care Department System Wide Float  [phone]

## 2019-05-15 NOTE — Progress Notes (Signed)
Chaplain provided grief support around death of pt's father at [facility]

## 2019-05-15 NOTE — Plan of Care (Signed)
Pt discharged to home via [facility] service on stretcher. Instructions discussed with pt with understandingk, covid 19 contract signed. Clear of covid 19 restriction, 21 days of isolation. Pt can attend father's funeral.

## 2019-05-15 NOTE — Progress Notes (Signed)
Physical Therapy Treatment Patient Details Name: Daniel Perkins MRN: [id number] DOB: November 20, 1955 Today's Date: 05/15/2019    History of Present Illness Pt is a 64 y.o. male admitted 04/28/19 with worsening SOB and cough; initial (+) COVID-19 on 04/24/19. PMH includes DM2, HTN, CKD II, bilateral club foot. Dx with COVID PNA, acute hypoxic respiratory failure, hyponatremia.     PT Comments    Pt did very well during his therapy session today.  He continues to need the support of the RW during hallway ambulation due to a L knee/L elbow gout flare up, however, he is able to use it safely and without physical assist.  O2 sats stable on RA during gait without DOE.  He did not report any lightheadedness while up and mobilizing, so BPs not checked.  I spoke with him at length re: going home vs rehab and he and I both feel he is ready to d/c home.  His sister will bring meals/food and leave it on his porch.  I issued and reviewed and extensive HEP as well as reviewed his IS/FV.  He returned demonstration on both.  He would benefit from follow up therapy at the house if he qualifies.  PT will continue to follow acutely for safe mobility progression   Follow Up Recommendations  Home health PT;Supervision - Intermittent(and HHOT)     Equipment Recommendations  Rolling walker with 5" wheels    Recommendations for Other Services   NA     Precautions / Restrictions Precautions Precautions: Fall;Other (comment) Precaution Comments: Pt was asymptomatic today, did not check BP    Mobility  Bed Mobility Overal bed mobility: Independent                Transfers Overall transfer level: Needs assistance Equipment used: Rolling walker (2 wheeled);None Transfers: Sit to/from [employer] to Stand: Min guard;Supervision Stand pivot transfers: Min guard;Supervision       General transfer comment: Min guard without AD, supervision with RW  Ambulation/Gait Ambulation/Gait  assistance: Supervision Gait Distance (Feet): 100 Feet Assistive device: Rolling walker (2 wheeled) Gait Pattern/deviations: Step-through pattern;Antalgic Gait velocity: decr Gait velocity interpretation: 1.31 - 2.62 ft/sec, indicative of limited community ambulator General Gait Details: Pt with antalgic gait pattern favoring his left knee per pt due to gout flare up.           Balance Overall balance assessment: Needs assistance Sitting-balance support: Feet supported;No upper extremity supported Sitting balance-Leahy Scale: Normal Sitting balance - Comments: Pt able to donn boots seated EOB   Standing balance support: Bilateral upper extremity supported Standing balance-Leahy Scale: Fair                              Cognition Arousal/Alertness: Awake/alert Behavior During Therapy: WFL for tasks assessed/performed Overall Cognitive Status: Within Functional Limits for tasks assessed                                 General Comments: Not specifically tested      Exercises General Exercises - Upper Extremity Shoulder ABduction: AROM;Both;10 reps;Seated;Theraband Theraband Level (Shoulder Abduction): Level 1 (Yellow) Shoulder Horizontal ABduction: AROM;Both;10 reps;Seated;Theraband Theraband Level (Shoulder Horizontal Abduction): Level 1 (Yellow) Elbow Flexion: AROM;Both;10 reps;Seated;Theraband Theraband Level (Elbow Flexion): Level 1 (Yellow) Elbow Extension: AROM;Both;10 reps;Seated;Theraband Theraband Level (Elbow Extension): Level 1 (Yellow) General Exercises - Lower Extremity Long Arc Quad: AROM;Both;10 reps;Seated Hip  ABduction/ADduction: AROM;Both;10 reps;Supine Straight Leg Raises: AROM;Both;10 reps;Supine Hip Flexion/Marching: AROM;Both;10 reps;Seated Other Exercises Other Exercises: Visually demonstrated standing exercise program which I encouraged to do his second week home.  Went through and practiced seated T-band UE and LE program  and encouraged him to do this his first week depending on how his gout responds to the exercises anywhere from 1-3 times per day.   Other Exercises: Reviewed IS/FV x 10 reps each with IS max inspired volume 1200 mL, cues to slow breath.  Encouraged pt to do theses 10 times per hour for the next two weeks after d/c.      General Comments General comments (skin integrity, edema, etc.): O2 sats stable in the 90s on RA during gait.       Pertinent Vitals/Pain Pain Assessment: Faces Faces Pain Scale: Hurts even more Pain Location: left elbow and L knee "gout" Pain Descriptors / Indicators: Grimacing;Guarding Pain Intervention(s): Limited activity within patient's tolerance;Monitored during session;Repositioned(RN to bring gout meds)           PT Goals (current goals can now be found in the care plan section) Acute Rehab PT Goals Patient Stated Goal: Return home PT Goal Formulation: With patient Time For Goal Achievement: 05/29/19 Potential to Achieve Goals: Good Progress towards PT goals: Progressing toward goals(goals updated)    Frequency    Min 3X/week      PT Plan Discharge plan needs to be updated       AM-PAC PT "6 Clicks" Mobility   Outcome Measure  Help needed turning from your back to your side while in a flat bed without using bedrails?: None Help needed moving from lying on your back to sitting on the side of a flat bed without using bedrails?: None Help needed moving to and from a bed to a chair (including a wheelchair)?: None Help needed standing up from a chair using your arms (e.g., wheelchair or bedside chair)?: None Help needed to walk in hospital room?: None Help needed climbing 3-5 steps with a railing? : A Little 6 Click Score: 23    End of Session   Activity Tolerance: Patient limited by pain Patient left: in chair;with call bell/phone within reach Nurse Communication: Mobility status PT Visit Diagnosis: Other abnormalities of gait and mobility  (R26.89);Difficulty in walking, not elsewhere classified (R26.2)     Time: 9798-9211 PT Time Calculation (min) (ACUTE ONLY): 42 min  Charges:  $Gait Training: 8-22 mins $Therapeutic Exercise: 8-22 mins $Self Care/Home Management: 8-22            Corinna Capra, PT, DPT  Acute Rehabilitation [phone] pager #[phone] office  @ [facility]: [phone]            05/15/2019, 11:43 AM

## 2019-05-15 NOTE — Progress Notes (Signed)
Just informed of his father's death on the phone. Grieving and weeping.

## 2019-05-15 NOTE — Consult Note (Signed)
   [facility] CM Inpatient Consult   05/15/2019  Daniel Perkins 15-Jun-1955 [id number]   Patient screened for long length of stay  hospitalizations review and checking for disposition needs for post hospital follow up  and to check if potential  [facility] Care Management services. .  Review of patient's medical record reveals patient was admitted with COVID-19 pneumonia.   Patient has orders for home health care follow up.  Please see notes here from MD Discharge Summary today as follows:  COVID Pneumonia -acute hypoxic respiratory failure. Positive test 04/24/2019, so on day of discharge 1/6, he has completed 21 days of isolation and may return to universal precautions.  Has completed courses of remdesivir and decadron - clinically much improved - has been successfully weaned to room air.  Spoke with the patient from the hospital bedside phone.  HIPAA verified.  Primary Care Provider is [facility].Eliezer Bottom, MD   Patient gave verbal consent for post hospital follow up.  Pharmacy is: [facility]  Plan:  Follow up with inpatient [facility] team message regarding following and checking for other needs sent and no current response. Assigned patient to [facility] Care Management team.  Please contact  for questions contact:   Charlesetta Shanks, RN BSN CCM [facility]  [phone] business mobile phone Toll free office [phone]  Fax number: [phone] [name]

## 2019-05-15 NOTE — Discharge Summary (Signed)
Physician Discharge Summary  Daniel Perkins ZOX:[id number] DOB: 03/15/1956 DOA: 04/28/2019  PCP: Eliezer BottomAslam, Sadia, MD  Admit date: 04/28/2019 Discharge date: 05/15/2019  Admitted From: Home Disposition: Home   Recommendations for Outpatient Follow-up:  1. Follow up with PCP in 1-2 weeks 2. Please obtain BMP/CBC in one week 3. Consider grief counseling as the patient's father recently passed away from covid as well.  Home Health: PT, OT Equipment/Devices: Rolling walker. Did not qualify for oxygen Discharge Condition: Stabe CODE STATUS: Full Diet recommendation: Heart healthy, carb-modified  Brief/Interim Summary: Daniel Perkins is a 63yo with a history of DM, CAD, HTN, HLD, and bilateral clubfoot who originally presented to an urgent care center 12/16 with fatigue, anorexia, and loss of taste and was ultimately found to be Covid positive. Since that time he has developed increasing shortness of breath with refractory cough. [facility] 12[facility]20 where a CXR noted bilateral pulmonary infiltrates and he required 10 L high flow nasal cannula oxygen to keep his saturations in the mid 90s.  Significant Events: 12/16 [facility] 12/20 admit via [place] [facility]  COVID-19 specific Treatment: Remdesivir 12/20 > 12/24 Decadron 12/20 > 12/28  Antimicrobials:  Azithromycin 12/20 > 12/24 Ceftriaxone 12/20 > 12/24  With the above treatments the patient's clinical status improved, hypoxia resolved even with exertion, functional mobility has improved, and inflammatory markers have declined. He is stable for discharge on 05/15/2019.  Discharge Diagnoses:  Principal Problem:   Pneumonia due to COVID-19 virus Active Problems:   Uncontrolled diabetes mellitus with complications (HCC)   HYPERTENSION   Obesity, Class II, BMI 35-39.9   Prostate hypertrophy   CKD (chronic kidney disease), stage II   Hepatic steatosis   Acute respiratory  failure with hypoxia (HCC)   Hypoxia   Type 2 diabetes mellitus without complication, with long-term current use of insulin (HCC)  COVID Pneumonia -acute hypoxic respiratory failure. Positive test 04/24/2019, so on day of discharge 1/6, he has completed 21 days of isolation and may return to universal precautions. This includes attending his father's funeral. Has completed courses of remdesivir and decadron - clinically much improved - has been successfully weaned to room air  DM 2 uncontrolled with hyperglycemia CBG much better controlled with discontinuation of steroid - plan to return to usual home regimen at time of discharge  HTN BP controlled  CKD stage II crt stable   Hyponatremia Sodium is holding steady at this time  Hepatic steatosis Noted on CT chest - will need outpt f/u   HLD Cont home med tx   BPH Cont home med tx   Obesity - Estimated body mass index is 36.49 kg/m as calculated from the following:   Height as of this encounter: 5\' 8"  (1.727 m).   Weight as of this encounter: 108.9 kg.  Discharge Instructions Discharge Instructions    [facility]B [facility]   Complete by: As directed    1) Please assign patient to social worker for post hospital follow up for grief counseling and assistance {[facility] father [facility]].  2) Please [facility] to [facility] RN Care Coordinator for complex care and disease management follow up calls, PCP is [facility] and assess for further needs.  Questions please call:   Charlesetta ShanksVictoria Brewer, RN BSN CCM [facility] Liaison  [phone] business mobile phone Toll free office [phone]  Fax number: [email or web address]    [place]  Doug Sou, RN BSN [facility] Liaison  [phone] business mobile phone Toll free office [phone]  Fax number:  [phone] [email or web address]   Reason for consult: Post hospital complex disease management   Diagnoses of:  COPD/ Pneumonia Other     Other Diagnosis: COVID - 19   Expected date of contact: 1-3 days (reserved for hospital discharges)   Diet - low sodium heart healthy   Complete by: As directed    Diet Carb Modified   Complete by: As directed    Discharge instructions   Complete by: As directed    You are being discharged from the hospital after treatment for covid-19 infection. You are felt to be stable enough to no longer require inpatient monitoring, testing, and treatment, though you will need to follow the recommendations below: - Continue taking medications as you were including as needed medications for gout. Home health therapies have been ordered - Your positive test was >21 days ago, so you have completed your course of isolation and may return to universal precautions (wearing mask, etc.) - You will received a rolling walker, but do not require supplemental oxygen at discharge.  - I'm so sorry for the loss of your father. Please continue to lean on your friends and family during this difficult time. - Do not take NSAID medications (including, but not limited to, ibuprofen, advil, motrin, naproxen, aleve, goody's powder, etc.) - Follow up with your doctor in the next week via telehealth or seek medical attention right away if your symptoms get WORSE.  - Consider donating plasma after you have recovered (either 14 days after a negative test or 28 days after symptoms have completely resolved) because your antibodies to this virus may be helpful to give to others with life-threatening infections. Please go to the website [email or web address] if you would like to consider volunteering for plasma donation.    Directions for you at home:  Wear a facemask You should wear a facemask that covers your nose and mouth when you are in the same room  with other people and when you visit a healthcare provider. People who live with or visit you should also wear a facemask while they are in the same room with you.  Separate yourself from other people in your home As much as possible, you should stay in a different room from other people in your home. Also, you should use a separate bathroom, if available.  Avoid sharing household items You should not share dishes, drinking glasses, cups, eating utensils, towels, bedding, or other items with other people in your home. After using these items, you should wash them thoroughly with soap and water.  Cover your coughs and sneezes Cover your mouth and nose with a tissue when you cough or sneeze, or you can cough or sneeze into your sleeve. Throw used tissues in a lined trash can, and immediately wash your hands with soap and water for at least 20 seconds or use an alcohol-based hand rub.  Wash your [facility] your hands often and thoroughly with soap and water for at least 20 seconds. You can use an alcohol-based hand sanitizer if soap and water are not available and if your hands are not visibly dirty. Avoid touching your eyes, nose, and mouth with unwashed hands.  Directions for those who live with, or provide care at home for you:  Limit the number of people who have contact with the patient If possible, have only one caregiver for the patient.  Other household members should stay in another home or place of residence. If this is not possible, they should stay in another room, or be separated from the patient as much as possible. Use a separate bathroom, if available. Restrict visitors who do not have an essential need to be in the home.  Ensure good ventilation Make sure that shared spaces in the home have good air flow, such as from an air conditioner or an opened window, weather permitting.  Wash your hands often Wash your hands often and thoroughly with soap and water for at least  20 seconds. You can use an alcohol based hand sanitizer if soap and water are not available and if your hands are not visibly dirty. Avoid touching your eyes, nose, and mouth with unwashed hands. Use disposable paper towels to dry your hands. If not available, use dedicated cloth towels and replace them when they become wet.  Wear a facemask and gloves Wear a disposable facemask at all times in the room and gloves when you touch or have contact with the patient's blood, body fluids, and/or secretions or excretions, such as sweat, saliva, sputum, nasal mucus, vomit, urine, or feces.  Ensure the mask fits over your nose and mouth tightly, and do not touch it during use. Throw out disposable facemasks and gloves after using them. Do not reuse. Wash your hands immediately after removing your facemask and gloves. If your personal clothing becomes contaminated, carefully remove clothing and launder. Wash your hands after handling contaminated clothing. Place all used disposable facemasks, gloves, and other waste in a lined container before disposing them with other household waste. Remove gloves and wash your hands immediately after handling these items.  Do not share dishes, glasses, or other household items with the patient Avoid sharing household items. You should not share dishes, drinking glasses, cups, eating utensils, towels, bedding, or other items with a patient who is confirmed to have, or being evaluated for, COVID-19 infection. After the person uses these items, you should wash them thoroughly with soap and water.  Wash laundry thoroughly Immediately remove and wash clothes or bedding that have blood, body fluids, and/or secretions or excretions, such as sweat, saliva, sputum, nasal mucus, vomit, urine, or feces, on them. Wear gloves when handling laundry from the patient. Read and follow directions on labels of laundry or clothing items and detergent. In general, wash and dry with the  warmest temperatures recommended on the label.  Clean all areas the individual has used often Clean all touchable surfaces, such as counters, tabletops, doorknobs, bathroom fixtures, toilets, phones, keyboards, tablets, and bedside tables, every day. Also, clean any surfaces that may have blood, body fluids, and/or secretions or excretions on them. Wear gloves when cleaning surfaces the patient has come in contact with. Use a diluted bleach solution (e.g., dilute bleach with 1 part bleach and 10 parts water) or a household disinfectant with a label that says EPA-registered for coronaviruses. To make a bleach solution at home, add 1 tablespoon of bleach to 1 quart (4 cups) of water. For a larger supply, add  cup of bleach to 1 gallon (16 cups) of water. Read labels of cleaning products and follow recommendations provided on product labels. Labels contain instructions for safe and effective use of the cleaning product including precautions you should take when applying the product, such as wearing gloves or eye protection and making sure you have good ventilation during use of the product. Remove gloves and wash hands immediately after cleaning.  Monitor yourself for signs and symptoms of illness Caregivers and household members are considered close contacts, should monitor their health, and will be asked to limit movement outside of the home to the extent possible. Follow the monitoring steps for close contacts listed on the symptom monitoring form.  If you have additional questions, contact your local health department or call the epidemiologist on call at [phone] (available 24/7). This guidance is subject to change. For the most up-to-date guidance from CDC, please refer to their website: TripMetro.huhttps://www.cdc.gov/coronavirus/2019-ncov/hcp/guidance-prevent-spread.html   Increase activity slowly   Complete by: As directed      Allergies as of 05/15/2019      Reactions   Morphine Itching    Pravastatin Sodium Other (See Comments)   Severe muscle cramps with one time requiring admission.       Medication List    STOP taking these medications   benzonatate 200 MG capsule Commonly known as: TESSALON     TAKE these medications   albuterol 108 (90 Base) MCG/ACT inhaler Commonly known as: VENTOLIN HFA Inhale 2 puffs into the lungs every 4 (four) hours as needed for wheezing or shortness of breath.   allopurinol 100 MG tablet Commonly known as: ZYLOPRIM Take 1 tablet (100 mg total) by mouth daily.   amLODipine 5 MG tablet Commonly known as: NORVASC Take 1 tablet (5 mg total) by mouth daily.   aspirin EC 81 MG tablet Take 1 tablet (81 mg total) by mouth daily.   carvedilol 12.5 MG tablet Commonly known as: COREG Take 1 tablet (12.5 mg total) by mouth 2 (two) times daily.   colchicine 0.6 MG tablet TAKE 1 TABLET BY MOUTH  DAILY AS NEEDED FOR GOUT  FLARE What changed:   how much to take  how to take this  when to take this  reasons to take this  additional instructions   empagliflozin 10 MG Tabs tablet Commonly known as: JARDIANCE Take 10 mg by mouth daily.   fluticasone 50 MCG/ACT nasal spray Commonly known as: FLONASE Place 1-2 sprays into both nostrils daily for 7 days.   ibuprofen 600 MG tablet Commonly known as: ADVIL Take 1 tablet (600 mg total) by mouth every 6 (six) hours as needed. What changed: reasons to take this   Insulin Pen Needle 32G X 4 MM Misc Use to inject victoza one time a day   liraglutide 18 MG/3ML Sopn Commonly known as: Victoza Inject 1.8 mg into the skin daily as instructed What changed:   how much to take  how to take this  when to take this  additional instructions   nitroGLYCERIN 0.4 MG SL tablet Commonly known as: NITROSTAT DISSOLVE 1 TABLET UNDER THE TONGUE EVERY 5 MINUTES AS  NEEDED FOR CHEST PAIN , MAX 3 TABLETS IN 15 MINUTES,  CALL 911 IF NO RELIEF. What changed:   how much to take  how to take  this  when to take this  additional instructions   rosuvastatin 20 MG tablet Commonly known as: CRESTOR Take 1 tablet (20 mg total) by mouth daily.   tamsulosin 0.4 MG Caps capsule Commonly known as: Flomax Take 1 capsule (0.4 mg total) by mouth daily.   valsartan 160 MG tablet Commonly known as: Diovan Take 1 tablet (160 mg total) by mouth daily.       Contact information for follow-up providers    Care, [facility] Follow up.   Specialty: Home Health Services Why: A representative from [facility] will contact  you to arrange start date and time for your therapy. Contact information: [address] [place] [address] [phone]        Eliezer Bottom, MD. Schedule an appointment as soon as possible for a visit in 1 week(s).   Specialty: Internal Medicine Contact information: [address] [place] [address] [phone]            Contact information for after-discharge care    Destination    [facility] .   Service: Skilled Nursing Contact information: [address] [place] [address] [phone]                 Allergies  Allergen Reactions  . Morphine Itching  . Pravastatin Sodium Other (See Comments)    Severe muscle cramps with one time requiring admission.     Consultations:  None  Procedures/Studies: CT Angio Chest PE W/Cm &/Or Wo Cm  Result Date: 04/28/2019 CLINICAL DATA:  64 year old male with COVID and shortness of breath EXAM: CT ANGIOGRAPHY CHEST WITH CONTRAST TECHNIQUE: Multidetector CT imaging of the chest was performed using the standard protocol during bolus administration of intravenous contrast. Multiplanar CT image reconstructions and MIPs were obtained to evaluate the vascular anatomy. CONTRAST:  51mL OMNIPAQUE IOHEXOL 350 MG/ML SOLN COMPARISON:  None. FINDINGS: Cardiovascular: Adequate opacification of the pulmonary arteries to the on or  proximal segmental level. No evidence of central filling defect to suggest acute pulmonary embolus. Two vessel aortic arch. The right brachiocephalic and left common carotid artery share a common origin. The heart is normal in size. No pericardial effusion. Mediastinum/Nodes: Unremarkable CT appearance of the thyroid gland. No suspicious mediastinal or hilar adenopathy. No soft tissue mediastinal mass. Small hiatal hernia. Lungs/Pleura: Combined centrilobular and paraseptal emphysema visualized in the upper lungs. Areas of patchy ground-glass attenuation airspace opacities scattered throughout both lungs with additional areas of linear atelectasis in the lower lobes. No evidence of pulmonary edema, pleural effusion or pneumothorax. Upper Abdomen: Low attenuation of the hepatic parenchyma suggests underlying steatosis. Musculoskeletal: No acute fracture or aggressive appearing lytic or blastic osseous lesion. Review of the MIP images confirms the above findings. IMPRESSION: 1. No evidence for acute pulmonary embolus. 2. Scattered areas of bilateral ground-glass attenuation airspace opacity interspersed with regions of linear atelectasis. Findings are consistent with viral pneumonia in this patient with COVID. 3. Above findings are superimposed on a background of mild combined centrilobular and paraseptal pulmonary emphysema. 4. Small hiatal hernia. 5. Suspect hepatic steatosis. Electronically Signed   By: Malachy Moan M.D.   On: 04/28/2019 12:23   DG CHEST PORT 1 VIEW  Result Date: 05/05/2019 CLINICAL DATA:  64 year old male with hypoxia EXAM: PORTABLE CHEST 1 VIEW COMPARISON:  Chest radiograph dated 04/28/2019 FINDINGS: Bilateral lower lung field streaky densities relatively similar or slightly increased on the right compared to prior radiograph in keeping with pneumonia, likely viral or atypical in etiology. Clinical correlation and follow-up recommended. No pleural effusion or pneumothorax. Stable  cardiac silhouette. No acute osseous pathology. IMPRESSION: Bilateral lower lung field streaky densities, slightly increased on the right compared to prior radiograph in keeping with pneumonia. Clinical correlation and follow-up recommended. Electronically Signed   By: Elgie Collard M.D.   On: 05/05/2019 19:15   DG Chest Port 1 View  Result Date: 04/28/2019 CLINICAL DATA:  Shortness of breath and hypoxia EXAM: PORTABLE CHEST 1 VIEW COMPARISON:  03/07/2019 FINDINGS: Patchy bilateral pulmonary infiltrate. Normal heart size and mediastinal contours. No  effusion or pneumothorax. Degenerative endplate spurring. IMPRESSION: Bilateral pneumonia. Electronically Signed   By: Marnee Spring M.D.   On: 04/28/2019 09:59    Subjective: No dyspnea, chest pain, leg pain reported. Ready to go home. Saddened by his father's death.  Discharge Exam: Vitals:   05/15/19 0541 05/15/19 0800  BP: 105/72 109/70  Pulse:  81  Resp: 18 18  Temp: 98.5 F (36.9 C) 99.3 F (37.4 C)  SpO2: 93% 94%   General: Pt is alert, awake, not in acute distress Cardiovascular: RRR, S1/S2 +, no rubs, no gallops Respiratory: CTA bilaterally, no wheezing, no rhonchi Abdominal: Soft, NT, ND, bowel sounds + Extremities: No edema, no cyanosis Psych: Appropriately depressed affect, normal behavior, no SI/HI.  Labs: BNP (last 3 results) Recent Labs    07/31/18 1540 04/28/19 0939  BNP 17.3 34.6   Basic Metabolic Panel: Recent Labs  Lab 05/14/19 0600  NA 132*  K 4.5  CL 100  CO2 24  GLUCOSE 126*  BUN 23  CREATININE 0.94  CALCIUM 9.2   Liver Function Tests: No results for input(s): AST, ALT, ALKPHOS, BILITOT, PROT, ALBUMIN in the last 168 hours. No results for input(s): LIPASE, AMYLASE in the last 168 hours. No results for input(s): AMMONIA in the last 168 hours. CBC: Recent Labs  Lab 05/15/19 0127  WBC 10.2  HGB 11.7*  HCT 38.1*  MCV 69.9*  PLT 250   Cardiac Enzymes: No results for input(s): CKTOTAL,  CKMB, CKMBINDEX, TROPONINI in the last 168 hours. BNP: Invalid input(s): POCBNP CBG: Recent Labs  Lab 05/14/19 1125 05/14/19 1607 05/14/19 2023 05/15/19 0744 05/15/19 1152  GLUCAP 111* 184* 154* 139* 133*   D-Dimer No results for input(s): DDIMER in the last 72 hours. Hgb A1c No results for input(s): HGBA1C in the last 72 hours. Lipid Profile No results for input(s): CHOL, HDL, LDLCALC, TRIG, CHOLHDL, LDLDIRECT in the last 72 hours. Thyroid function studies No results for input(s): TSH, T4TOTAL, T3FREE, THYROIDAB in the last 72 hours.  Invalid input(s): FREET3 Anemia work up No results for input(s): VITAMINB12, FOLATE, FERRITIN, TIBC, IRON, RETICCTPCT in the last 72 hours. Urinalysis    Component Value Date/Time   COLORURINE YELLOW 05/05/2019 1705   APPEARANCEUR CLEAR 05/05/2019 1705   APPEARANCEUR Clear 01/12/2016 1634   LABSPEC 1.021 05/05/2019 1705   PHURINE 5.0 05/05/2019 1705   GLUCOSEU >=500 (A) 05/05/2019 1705   HGBUR NEGATIVE 05/05/2019 1705   BILIRUBINUR NEGATIVE 05/05/2019 1705   BILIRUBINUR negative 12/06/2018 1659   BILIRUBINUR Negative 01/12/2016 1634   KETONESUR NEGATIVE 05/05/2019 1705   PROTEINUR NEGATIVE 05/05/2019 1705   UROBILINOGEN 2.0 (A) 12/06/2018 1659   UROBILINOGEN 1 10/23/2013 1555   NITRITE NEGATIVE 05/05/2019 1705   LEUKOCYTESUR NEGATIVE 05/05/2019 1705    Microbiology No results found for this or any previous visit (from the past 240 hour(s)).  Time coordinating discharge: Approximately 40 minutes  Tyrone Nine, MD  [facility] 05/18/2019, 5:01 PM

## 2019-05-16 ENCOUNTER — Other Ambulatory Visit: Payer: Self-pay | Admitting: *Deleted

## 2019-05-16 NOTE — Patient Outreach (Signed)
[facility] Care Management  05/16/2019  Shinichi Anguiano Aug 04, 1955 [id number]   Referral received from hospital liaison as he was recently discharged from hospital after Covid 19 infection.  Primary MD office will complete transition of care assessment.  Noted that member's father passed away of Covid 19 infection while member was hospitalized.  Per chart, he has history of HTN, DM with A1C of 9.2, PBD, CKD, and hyperlipidemia.  Call placed to member to follow up on discharge, no answer.  Unable to leave a message as mailbox is full.  Will send unsuccessful outreach letter and follow up within the next 3-4 business days.  Kemper Durie, [place], MSN [facility] Care Management  [facility] Manager [phone]

## 2019-05-17 ENCOUNTER — Other Ambulatory Visit: Payer: Self-pay

## 2019-05-17 DIAGNOSIS — E119 Type 2 diabetes mellitus without complications: Secondary | ICD-10-CM

## 2019-05-18 MED ORDER — GLIPIZIDE 10 MG PO TABS: ORAL_TABLET | ORAL | 0 refills | Status: DC

## 2019-05-20 ENCOUNTER — Encounter: Payer: Self-pay | Admitting: *Deleted

## 2019-05-20 ENCOUNTER — Other Ambulatory Visit: Payer: Self-pay | Admitting: *Deleted

## 2019-05-20 NOTE — Patient Outreach (Signed)
[facility]) Care Management  05/20/2019  Faris Coolman 10-11-1955 [id number]   CSW made an initial attempt to try and contact patient today to perform the initial phone assessment, as well as assess and assist with social work needs and services, without success.  A HIPAA compliant message was left for patient on voicemail.  CSW is currently awaiting a return call.  CSW will make a second outreach attempt within the next 3-4 business days, on Thursday, May 23, 2019, around 10:30am, if a return call is not received from patient in the meantime.  CSW will also mail an Outreach Letter to patient's home requesting that patient contact CSW if patient is interested in receiving social work services through CSW with Chief Executive Officer.  [name], BSW, MSW, LCSW  Licensed Restaurant manager, [employer] [facility]  Mailing [place] [address] [place], [place] [address] Physical Address-[address], [place], [place] [address] Toll Free Main # [phone] Fax # [phone] Cell # [phone]  Office # [phone] [email or web address]

## 2019-05-20 NOTE — Telephone Encounter (Signed)
Appt has been sch for 06/25/2019 @ 1:45 pm with his PCP.

## 2019-05-21 ENCOUNTER — Other Ambulatory Visit: Payer: Self-pay | Admitting: *Deleted

## 2019-05-21 NOTE — Patient Outreach (Signed)
[facility]) Care Management  05/21/2019  Daniel Perkins [birth year] [id number]   Outreach attempt #2, unsuccessful.  Referral received from hospital liaison as he was recently discharged from hospital after Covid 19 infection.  Primary MD office will complete transition of care assessment.  Noted that member's father passed away of Covid 19 infection while member was hospitalized.  Per chart, he has history of HTN, DM with A1C of 9.2, PBD, CKD, and hyperlipidemia.  Call placed to member to follow up on discharge, no answer.  Unable to leave a message as mailbox is full.  Will follow up within the next 3-4 business days.  Kemper Durie, [place], MSN [facility] Care Management  [facility] Manager [phone]

## 2019-05-23 ENCOUNTER — Other Ambulatory Visit: Payer: Medicare Other | Admitting: *Deleted

## 2019-05-23 NOTE — Patient Outreach (Signed)
[facility]) Care Management  05/23/2019  Daniel Perkins 1955-11-21 [id number]   CSW made a second attempt to try and contact patient today to perform phone assessment, as well as assess and assist with social work needs and services, without success.  A HIPAA compliant message was left for patient on voicemail.  CSW continues to await a return call.  CSW will make a third and final outreach attempt within the next 3-4 business days, if a return call is not received from patient in the meantime.  CSW will then proceed with case closure if a return call is not received from patient with a total of 10 business days, as required number of phone attempts will have been made and outreach letter mailed.   [name], BSW, MSW, LCSW  Licensed Restaurant manager, fast food [facility]  Mailing [place] [address], [place], [place] [address] Physical Address-[address], [place], [place] [address] Toll Free Main # [phone] Fax # [phone] Cell # [phone]  Office # [phone] [email or web address]

## 2019-05-24 ENCOUNTER — Other Ambulatory Visit: Payer: Self-pay | Admitting: *Deleted

## 2019-05-24 NOTE — Patient Outreach (Addendum)
[facility]) Care Management  05/24/2019  Daniel Perkins 1955-12-07 [id number]   Outreach attempt #3, unsuccessful.  Referral received from hospital liaison as he was recently discharged from hospital after Covid 19 infection.  Primary MD office will complete transition of care assessment.  Noted that member's father passed away of Covid 19 infection while member was hospitalized.  Per chart, he has history of HTN, DM with A1C of 9.2, PBD, CKD, and hyperlipidemia.  Call placed to member's (ex)wife cell phone number per his request yesterday [phone]) however no answer.  HIPAA compliant voice message left.  He stated that his home phone does not always work and he does not get all of the messages.  Will plan to make outreach attempt to wife on Monday 1/18, will attempt assessment for member if outreach is successful and he is with the wife during call.  If unable to make contact by 1/21, will close case due to inability to establish contact.  Kemper Durie, [place], MSN [facility] Care Management  [facility] Manager [phone]

## 2019-05-29 ENCOUNTER — Other Ambulatory Visit: Payer: Self-pay | Admitting: *Deleted

## 2019-05-29 NOTE — Patient Outreach (Signed)
[facility]) Care Management  05/29/2019  Daniel Perkins 08/24/1955 [id number]   No response from member after multiple unsuccessful outreach attempts and letter sent.  Will close case at this time due to inability to maintain contact.  Will notify member and primary MD of case closure.  Kemper Durie, [place], MSN [facility] Care Management  [facility] Manager [phone]

## 2019-05-29 NOTE — Patient Outreach (Signed)
[facility]) Care Management  05/29/2019  Daniel Perkins 1956-01-13 [id number]    CSW made a third and final attempt to try and contact patient today to perform phone assessment, as well as assess and assist with social work needs and services, without success.  A HIPAA compliant message was left for patient on voicemail.  CSW is currently awaiting a return call.  CSW will proceed with case closure in two business days, if a return call is not received in the meantime, as required number of phone attempts have been made and an outreach letter was mailed to patient's home allowing 10 business days for a response if patient was interested in receiving social work services through CSW with Chief Executive Officer.  [name], BSW, MSW, LCSW  Licensed Restaurant manager, fast food [facility]  Mailing [place] [address] [place], [place] [address] Physical Address-[address], [place], [place] [address] Toll Free Main # [phone] Fax # [phone] Cell # [phone]  Office # [phone] [email or web address]

## 2019-06-03 ENCOUNTER — Other Ambulatory Visit: Payer: Self-pay

## 2019-06-04 ENCOUNTER — Encounter: Payer: Self-pay | Admitting: *Deleted

## 2019-06-04 ENCOUNTER — Other Ambulatory Visit: Payer: Self-pay | Admitting: *Deleted

## 2019-06-04 NOTE — Patient Outreach (Signed)
[facility]) Care Management  06/04/2019  [name] [birth year] [id number]  CSW will perform a case closure on patient, due to inability to establish initial phone contact with patient, despite required number of phone attempts made and outreach letter mailed to patient's home allowing 10 business days for a response if patient was interested in receiving social work services through CSW with [facility] [employer], [employer].  CSW will notify patient's RNCM with [facility] Care Management, Kemper Durie of CSW's plans to close patient's case.  CSW will fax an update to patient's Primary Care Physician, Dr. Eliezer Bottom to ensure that they are aware of CSW's involvement with patient's plan of care.   [name], BSW, MSW, LCSW  Licensed Restaurant manager, fast food [facility]  Mailing [place] [address] [place], [place] [address] Physical Address[address], [place], [place] [address] Toll Free Main # [phone] Fax # [phone] Cell # [phone]  Office # [phone] [email or web address]

## 2019-06-25 ENCOUNTER — Telehealth: Payer: Self-pay

## 2019-06-25 ENCOUNTER — Ambulatory Visit (INDEPENDENT_AMBULATORY_CARE_PROVIDER_SITE_OTHER): Payer: Medicare Other | Admitting: Internal Medicine

## 2019-06-25 ENCOUNTER — Other Ambulatory Visit: Payer: Self-pay

## 2019-06-25 ENCOUNTER — Encounter: Payer: Self-pay | Admitting: Internal Medicine

## 2019-06-25 VITALS — BP 140/81 | HR 88 | Temp 98.8°F | Ht 68.0 in | Wt 215.4 lb

## 2019-06-25 DIAGNOSIS — E118 Type 2 diabetes mellitus with unspecified complications: Secondary | ICD-10-CM

## 2019-06-25 DIAGNOSIS — I10 Essential (primary) hypertension: Secondary | ICD-10-CM

## 2019-06-25 DIAGNOSIS — M109 Gout, unspecified: Secondary | ICD-10-CM

## 2019-06-25 DIAGNOSIS — N400 Enlarged prostate without lower urinary tract symptoms: Secondary | ICD-10-CM | POA: Diagnosis not present

## 2019-06-25 DIAGNOSIS — E119 Type 2 diabetes mellitus without complications: Secondary | ICD-10-CM

## 2019-06-25 DIAGNOSIS — U071 COVID-19: Secondary | ICD-10-CM

## 2019-06-25 DIAGNOSIS — E1165 Type 2 diabetes mellitus with hyperglycemia: Secondary | ICD-10-CM

## 2019-06-25 DIAGNOSIS — Z9111 Patient's noncompliance with dietary regimen: Secondary | ICD-10-CM

## 2019-06-25 DIAGNOSIS — Z8701 Personal history of pneumonia (recurrent): Secondary | ICD-10-CM

## 2019-06-25 DIAGNOSIS — Z23 Encounter for immunization: Secondary | ICD-10-CM | POA: Diagnosis not present

## 2019-06-25 DIAGNOSIS — R634 Abnormal weight loss: Secondary | ICD-10-CM

## 2019-06-25 DIAGNOSIS — Z79899 Other long term (current) drug therapy: Secondary | ICD-10-CM

## 2019-06-25 DIAGNOSIS — Z794 Long term (current) use of insulin: Secondary | ICD-10-CM | POA: Diagnosis not present

## 2019-06-25 DIAGNOSIS — Z0000 Encounter for general adult medical examination without abnormal findings: Secondary | ICD-10-CM

## 2019-06-25 DIAGNOSIS — Z8616 Personal history of COVID-19: Secondary | ICD-10-CM

## 2019-06-25 DIAGNOSIS — M10042 Idiopathic gout, left hand: Secondary | ICD-10-CM

## 2019-06-25 DIAGNOSIS — IMO0002 Reserved for concepts with insufficient information to code with codable children: Secondary | ICD-10-CM

## 2019-06-25 DIAGNOSIS — Z7984 Long term (current) use of oral hypoglycemic drugs: Secondary | ICD-10-CM

## 2019-06-25 DIAGNOSIS — J1282 Pneumonia due to coronavirus disease 2019: Secondary | ICD-10-CM

## 2019-06-25 DIAGNOSIS — Z6832 Body mass index (BMI) 32.0-32.9, adult: Secondary | ICD-10-CM

## 2019-06-25 LAB — POCT GLYCOSYLATED HEMOGLOBIN (HGB A1C): Hemoglobin A1C: 6.9 % — AB (ref 4.0–5.6)

## 2019-06-25 LAB — GLUCOSE, CAPILLARY: Glucose-Capillary: 98 mg/dL (ref 70–99)

## 2019-06-25 MED ORDER — VICTOZA 18 MG/3ML ~~LOC~~ SOPN: PEN_INJECTOR | SUBCUTANEOUS | 11 refills | Status: DC

## 2019-06-25 MED ORDER — COLCHICINE 0.6 MG PO TABS: ORAL_TABLET | ORAL | 0 refills | Status: DC

## 2019-06-25 MED ORDER — TAMSULOSIN HCL 0.4 MG PO CAPS: 0.4000 mg | ORAL_CAPSULE | Freq: Every day | ORAL | 0 refills | Status: DC

## 2019-06-25 MED ORDER — ALLOPURINOL 100 MG PO TABS: 100.0000 mg | ORAL_TABLET | Freq: Every day | ORAL | 3 refills | Status: DC

## 2019-06-25 MED ORDER — GLIPIZIDE 10 MG PO TABS: ORAL_TABLET | ORAL | 0 refills | Status: DC

## 2019-06-25 NOTE — Telephone Encounter (Signed)
Called back [facility] Pharmacist. Patient is okay to have colchicine and carvedilol. He only takes colchicine during a gout flare and not regularly. Pharmacist will fill prescription.

## 2019-06-25 NOTE — Progress Notes (Signed)
CC: hypertension and diabetes f/u  HPI:  DanielDaniel Perkins is a 64 y.o. male with PMHx as listed below presenting for follow up of his hypertension and diabetes. Please see problem based charting for further assessment and plan.   Past Medical History:  Diagnosis Date  . Club foot of both lower extremities 1958  . Coronary artery disease    Holter monitor 02/2012 - sinus with rare PVC  . CTEV (congenital talipes equinovarus)   . Gout   . Hyperlipidemia   . Hypertension   . Myocardial infarction [facility]) ~ 2007   "mild"  . Osteomyelitis (HCC)   . Pneumonia 2000's   "couple times"  . Type II diabetes mellitus (HCC) 2011   Review of Systems:   Review of Systems  Constitutional: Positive for weight loss. Negative for chills, diaphoresis, fever and malaise/fatigue.  Eyes: Negative for blurred vision and double vision.  Respiratory: Negative for sputum production, shortness of breath and wheezing.   Cardiovascular: Negative for chest pain, orthopnea, claudication and leg swelling.  Gastrointestinal: Negative for abdominal pain, diarrhea, nausea and vomiting.  Genitourinary: Positive for frequency and urgency. Negative for dysuria.  Musculoskeletal: Negative for falls and myalgias.  Skin: Negative for rash.  Neurological: Negative for dizziness, tingling, sensory change, weakness and headaches.  Psychiatric/Behavioral: Negative for depression and suicidal ideas. The patient is not nervous/anxious and does not have insomnia.      Physical Exam:  Vitals:   06/25/19 1404  BP: 140/81  Pulse: 88  Temp: 98.8 F (37.1 C)  TempSrc: Oral  SpO2: 97%  Weight: 215 lb 6.4 oz (97.7 kg)  Height: 5\' 8"  (1.727 m)   Physical Exam Constitutional:      General: He is not in acute distress.    Appearance: Normal appearance. He is not diaphoretic.  HENT:     Mouth/Throat:     Mouth: Mucous membranes are moist.     Pharynx: Oropharynx is clear.  Eyes:     Extraocular Movements:  Extraocular movements intact.     Conjunctiva/sclera: Conjunctivae normal.  Cardiovascular:     Rate and Rhythm: Normal rate and regular rhythm.     Pulses: Normal pulses.     Heart sounds: Normal heart sounds. No friction rub. No gallop.   Pulmonary:     Effort: Pulmonary effort is normal. No respiratory distress.     Breath sounds: Normal breath sounds. No wheezing, rhonchi or rales.  Abdominal:     General: Bowel sounds are normal. There is no distension.     Palpations: Abdomen is soft.     Tenderness: There is no abdominal tenderness.  Musculoskeletal:        General: No swelling or tenderness. Normal range of motion.     Cervical back: Normal range of motion and neck supple.  Skin:    General: Skin is warm and dry.     Capillary Refill: Capillary refill takes less than 2 seconds.     Comments: Noted to have unkept toe nails with left great toe nail creating indentation in medial aspect of 2nd toe, no ulceration or drainage noted   Neurological:     General: No focal deficit present.     Mental Status: He is alert and oriented to person, place, and time. Mental status is at baseline.     Cranial Nerves: No cranial nerve deficit.     Sensory: No sensory deficit.     Motor: No weakness.      Assessment &  Plan:   See Encounters Tab for problem based charting.  Patient discussed with Dr. Evette Doffing                       [phone]

## 2019-06-25 NOTE — Patient Instructions (Addendum)
Daniel Perkins,  It was a pleasure seeing you in clinic today. Today we discussed the following:   Blood pressure: Please continue to take your medications as prescribed. At this time, please adhere to a low sodium diet and try to walk 30 minutes at least 3 times per week.    Diabetes: Your A1c is 6.9 today. Please continue to take your medications as prescribed.  Diabetic foot care: I will place a referral to the podiatrist. Please schedule an appointment at your earliest convenience.   Please contact us if you have any questions.  Thank you!

## 2019-06-25 NOTE — Telephone Encounter (Signed)
Pt was seen today in Dr. Joretta Bachelor clinic.  Received TC from [facility] pharmacist, Merla Riches. Pharmacist states she received RX for colchicine today.  Pharmacist states "Pt is on carvedilol also and carvedilol can raise the levels of colchicine which could cause diarrhea and renal toxicity".  Pharmacist needs to make MD aware and get her ok before filling RX.  If MD needs to speak with pharmacist, phone number is [phone]. Will forward to Dr. Mcarthur Rossetti to advise. Thank you, SChaplin, RN,BSN

## 2019-06-27 NOTE — Assessment & Plan Note (Signed)
Patient received flu vaccine at this visit.  

## 2019-06-27 NOTE — Assessment & Plan Note (Signed)
Patient has not had any recent flares. He is on allopurinol and has colchicine to take as needed for flares.   - Refilled allopurinol and colchicine

## 2019-06-27 NOTE — Assessment & Plan Note (Signed)
Hb 6.9 at this visit. Patient on Jardiance 10mg  daily, Glipizide 10mg  bid and VIctoza 1.8 daily. He does not check his blood sugars at home. He denies any sensory changes in hands or feet. On foot exam, patient is noted to have indentation at medial aspect of 2nd left toe from unkept great toe nail. No obvious ulceration noted but patient would benefit from diabetic foot care.  - Continue current regimen - Referral to podiatry for diabetic foot care - Repeat A1c in 3 months

## 2019-06-27 NOTE — Assessment & Plan Note (Signed)
BP 140/81 today. Patient on amlodipine 5mg  qd, carvedilol 12.5mg  bid, and valsartan 160mg  qd. Patient notes recent dietary indiscretion and notes that he has been depressed with recent passing of his father from COVID. He notes that both him and his father were hospitalized with COVID. However, his father passed away at [facility] a few hours prior to Daniel Perkins's discharge from [facility]. He notes that it has been tough adjusting to living alone without him but he is motivated to get back into a routine. We discussed adhering to a low sodium diet and starting to walk for 30 minutes at least 3 times weekly to start.  - Continue current regimen - BP check in 3 months  - If remains elevated, will increase his amlodipine

## 2019-06-27 NOTE — Assessment & Plan Note (Signed)
Patient was recently hospitalized in December with COVID at [facility]. He did not require intubation He was treated with remdesivir and dexamethasone with good response. He notes that he is doing well over all but still has some fatigue.  - Continue to monitor

## 2019-06-27 NOTE — Assessment & Plan Note (Signed)
This is stable today. Patient requesting refill of Flomax  - Refilled tamsulosin 0.4mg  daily

## 2019-07-01 NOTE — Progress Notes (Signed)
Internal Medicine Clinic Attending  Case discussed with Dr. Aslam at the time of the visit.  We reviewed the resident's history and exam and pertinent patient test results.  I agree with the assessment, diagnosis, and plan of care documented in the resident's note.  

## 2019-07-01 NOTE — Addendum Note (Signed)
Addended by: Erlinda Hong T on: 07/01/2019 08:01 AM   Modules accepted: Level of Service

## 2019-07-03 ENCOUNTER — Encounter: Payer: Self-pay | Admitting: *Deleted

## 2019-07-24 ENCOUNTER — Other Ambulatory Visit: Payer: Self-pay

## 2019-07-24 ENCOUNTER — Encounter (HOSPITAL_COMMUNITY): Payer: Self-pay | Admitting: Emergency Medicine

## 2019-07-24 DIAGNOSIS — I10 Essential (primary) hypertension: Secondary | ICD-10-CM | POA: Insufficient documentation

## 2019-07-24 DIAGNOSIS — Z794 Long term (current) use of insulin: Secondary | ICD-10-CM | POA: Insufficient documentation

## 2019-07-24 DIAGNOSIS — R000 Tachycardia, unspecified: Secondary | ICD-10-CM | POA: Diagnosis not present

## 2019-07-24 DIAGNOSIS — S199XXA Unspecified injury of neck, initial encounter: Secondary | ICD-10-CM | POA: Diagnosis present

## 2019-07-24 DIAGNOSIS — Z87891 Personal history of nicotine dependence: Secondary | ICD-10-CM | POA: Diagnosis not present

## 2019-07-24 DIAGNOSIS — Y93I9 Activity, other involving external motion: Secondary | ICD-10-CM | POA: Insufficient documentation

## 2019-07-24 DIAGNOSIS — S161XXA Strain of muscle, fascia and tendon at neck level, initial encounter: Secondary | ICD-10-CM | POA: Insufficient documentation

## 2019-07-24 DIAGNOSIS — Y92410 Unspecified street and highway as the place of occurrence of the external cause: Secondary | ICD-10-CM | POA: Insufficient documentation

## 2019-07-24 DIAGNOSIS — Y999 Unspecified external cause status: Secondary | ICD-10-CM | POA: Diagnosis not present

## 2019-07-24 DIAGNOSIS — Z7982 Long term (current) use of aspirin: Secondary | ICD-10-CM | POA: Diagnosis not present

## 2019-07-24 DIAGNOSIS — M542 Cervicalgia: Secondary | ICD-10-CM | POA: Diagnosis not present

## 2019-07-24 DIAGNOSIS — Z79899 Other long term (current) drug therapy: Secondary | ICD-10-CM | POA: Diagnosis not present

## 2019-07-24 DIAGNOSIS — Z743 Need for continuous supervision: Secondary | ICD-10-CM | POA: Diagnosis not present

## 2019-07-24 DIAGNOSIS — E119 Type 2 diabetes mellitus without complications: Secondary | ICD-10-CM | POA: Diagnosis not present

## 2019-07-24 DIAGNOSIS — R0902 Hypoxemia: Secondary | ICD-10-CM | POA: Diagnosis not present
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
## 2019-07-25 ENCOUNTER — Emergency Department (HOSPITAL_COMMUNITY)
Admission: EM | Admit: 2019-07-25 | Discharge: 2019-07-25 | Disposition: A | Discharge status: Home / Self Care | Payer: Medicare Other | Attending: Emergency Medicine | Admitting: Emergency Medicine

## 2019-07-25 ENCOUNTER — Emergency Department (HOSPITAL_COMMUNITY)
Admission: EM | Admit: 2019-07-25 | Discharge: 2019-07-25 | Disposition: A | Discharge status: Home / Self Care | Payer: Medicare Other | Source: Home / Self Care | Attending: Emergency Medicine | Admitting: Emergency Medicine

## 2019-07-25 ENCOUNTER — Emergency Department (HOSPITAL_COMMUNITY): Payer: Medicare Other

## 2019-07-25 DIAGNOSIS — Z743 Need for continuous supervision: Secondary | ICD-10-CM | POA: Diagnosis not present

## 2019-07-25 DIAGNOSIS — S161XXA Strain of muscle, fascia and tendon at neck level, initial encounter: Secondary | ICD-10-CM | POA: Diagnosis not present

## 2019-07-25 DIAGNOSIS — I10 Essential (primary) hypertension: Secondary | ICD-10-CM | POA: Diagnosis not present

## 2019-07-25 DIAGNOSIS — E1165 Type 2 diabetes mellitus with hyperglycemia: Secondary | ICD-10-CM | POA: Diagnosis not present

## 2019-07-25 DIAGNOSIS — M542 Cervicalgia: Secondary | ICD-10-CM | POA: Diagnosis not present

## 2019-07-25 MED ORDER — FENTANYL CITRATE (PF) 100 MCG/2ML IJ SOLN
50.0000 ug | Freq: Once | INTRAMUSCULAR | Status: AC
  Administered 2019-07-25: 50 ug via INTRAVENOUS
  Filled 2019-07-25: qty 2

## 2019-07-25 MED ORDER — OXYCODONE-ACETAMINOPHEN 5-325 MG PO TABS: 1.0000 | ORAL_TABLET | ORAL | 0 refills | Status: DC | PRN

## 2019-07-25 MED ORDER — DIAZEPAM 2 MG PO TABS
2.0000 mg | ORAL_TABLET | Freq: Once | ORAL | Status: AC
  Administered 2019-07-25: 2 mg via ORAL
  Filled 2019-07-25: qty 1

## 2019-07-25 MED ORDER — ONDANSETRON HCL 4 MG/2ML IJ SOLN
4.0000 mg | Freq: Once | INTRAMUSCULAR | Status: AC
  Administered 2019-07-25: 4 mg via INTRAVENOUS
  Filled 2019-07-25: qty 2

## 2019-07-25 MED ORDER — CYCLOBENZAPRINE HCL 10 MG PO TABS: 10.0000 mg | ORAL_TABLET | Freq: Three times a day (TID) | ORAL | 0 refills | Status: DC | PRN

## 2019-07-25 MED ORDER — OXYCODONE-ACETAMINOPHEN 5-325 MG PO TABS
1.0000 | ORAL_TABLET | Freq: Once | ORAL | Status: AC
  Administered 2019-07-25: 17:00:00 1 via ORAL
  Filled 2019-07-25: qty 1

## 2019-07-25 MED ORDER — CYCLOBENZAPRINE HCL 10 MG PO TABS
5.0000 mg | ORAL_TABLET | Freq: Once | ORAL | Status: AC
  Administered 2019-07-25: 5 mg via ORAL
  Filled 2019-07-25: qty 1

## 2019-07-25 NOTE — Discharge Instructions (Signed)
Continue taking the muscle relaxers and pain medicines that you received at your visit yesterday.  The pain medication can cause drowsiness so do not drive or work while on this medication.  The pain medicine can also cause constipation so would recommend buying some MiraLAX over-the-counter and taking 1 capful daily to help prevent constipation.  You should call your neurosurgeon in regards to your continued neck pain.  If you have any new or worsening symptoms in the meantime including any numbness or weakness to your arms or legs then you should return to the emergency department immediately.
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
## 2019-07-25 NOTE — ED Notes (Signed)
Patient verbalizes understanding of discharge instructions. Opportunity for questioning and answers were provided. Armband removed by staff, pt discharged from ED in wheelchair to home.   
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
## 2019-07-29 NOTE — Addendum Note (Signed)
Addended by: Neomia Dear on: 07/29/2019 01:26 PM   Modules accepted: Orders

## 2019-08-19 ENCOUNTER — Other Ambulatory Visit: Payer: Self-pay | Admitting: Cardiovascular Disease

## 2019-08-19 NOTE — Telephone Encounter (Signed)
Please review for refill. Thanks!  

## 2019-08-20 DIAGNOSIS — M5030 Other cervical disc degeneration, unspecified cervical region: Secondary | ICD-10-CM | POA: Diagnosis not present

## 2019-08-20 DIAGNOSIS — M542 Cervicalgia: Secondary | ICD-10-CM | POA: Diagnosis not present

## 2019-08-20 DIAGNOSIS — M47892 Other spondylosis, cervical region: Secondary | ICD-10-CM | POA: Diagnosis not present

## 2019-08-20 DIAGNOSIS — M4322 Fusion of spine, cervical region: Secondary | ICD-10-CM | POA: Diagnosis not present

## 2019-08-27 ENCOUNTER — Other Ambulatory Visit: Payer: Self-pay | Admitting: Internal Medicine

## 2019-08-27 ENCOUNTER — Telehealth: Payer: Self-pay | Admitting: *Deleted

## 2019-08-27 ENCOUNTER — Telehealth: Payer: Self-pay | Admitting: Internal Medicine

## 2019-08-27 ENCOUNTER — Ambulatory Visit (INDEPENDENT_AMBULATORY_CARE_PROVIDER_SITE_OTHER): Payer: Medicare Other | Admitting: Internal Medicine

## 2019-08-27 ENCOUNTER — Other Ambulatory Visit: Payer: Self-pay

## 2019-08-27 DIAGNOSIS — M542 Cervicalgia: Secondary | ICD-10-CM | POA: Diagnosis not present

## 2019-08-27 DIAGNOSIS — E119 Type 2 diabetes mellitus without complications: Secondary | ICD-10-CM

## 2019-08-27 MED ORDER — GLIPIZIDE 10 MG PO TABS: ORAL_TABLET | ORAL | 0 refills | Status: DC

## 2019-08-27 MED ORDER — CYCLOBENZAPRINE HCL 10 MG PO TABS: 10.0000 mg | ORAL_TABLET | Freq: Three times a day (TID) | ORAL | 0 refills | Status: DC | PRN
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
## 2019-08-27 NOTE — Telephone Encounter (Signed)
Need refill on glipiZIDE (GLUCOTROL) 10 MG tablet rosuvastatin (CRESTOR) 20 MG tablet cyclobenzaprine (FLEXERIL) 10 MG tablet  ;pt contact [phone]   [facility] - [place], [place] - [address]
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
## 2019-08-27 NOTE — Telephone Encounter (Signed)
Pls contact pt regarding regarding spinal specialist visit [phone]: Spinal Specialist [phone] Dr Jake Seats

## 2019-08-27 NOTE — Telephone Encounter (Signed)
Telehealth call x 2 - no answer; unable to leave a message.

## 2019-09-03 DIAGNOSIS — M542 Cervicalgia: Secondary | ICD-10-CM | POA: Diagnosis not present

## 2019-09-03 DIAGNOSIS — M4322 Fusion of spine, cervical region: Secondary | ICD-10-CM | POA: Diagnosis not present

## 2019-09-03 DIAGNOSIS — M5030 Other cervical disc degeneration, unspecified cervical region: Secondary | ICD-10-CM | POA: Diagnosis not present

## 2019-09-03 DIAGNOSIS — M47892 Other spondylosis, cervical region: Secondary | ICD-10-CM | POA: Diagnosis not present

## 2019-09-04 NOTE — Progress Notes (Signed)
Internal Medicine Clinic Attending  Case discussed with Dr. Aslam at the time of the visit.  We reviewed the resident's history and exam and pertinent patient test results.  I agree with the assessment, diagnosis, and plan of care documented in the resident's note.  

## 2019-09-05 ENCOUNTER — Other Ambulatory Visit: Payer: Self-pay | Admitting: *Deleted

## 2019-09-05 DIAGNOSIS — E119 Type 2 diabetes mellitus without complications: Secondary | ICD-10-CM

## 2019-09-05 MED ORDER — EMPAGLIFLOZIN 10 MG PO TABS: 10.0000 mg | ORAL_TABLET | Freq: Every day | ORAL | 1 refills | Status: DC

## 2019-09-06 ENCOUNTER — Other Ambulatory Visit: Payer: Self-pay | Admitting: Orthopaedic Surgery

## 2019-09-06 DIAGNOSIS — M5030 Other cervical disc degeneration, unspecified cervical region: Secondary | ICD-10-CM

## 2019-09-09 ENCOUNTER — Telehealth: Payer: Self-pay | Admitting: *Deleted

## 2019-09-09 DIAGNOSIS — M542 Cervicalgia: Secondary | ICD-10-CM | POA: Diagnosis not present

## 2019-09-09 DIAGNOSIS — M5030 Other cervical disc degeneration, unspecified cervical region: Secondary | ICD-10-CM | POA: Diagnosis not present

## 2019-09-09 DIAGNOSIS — M6281 Muscle weakness (generalized): Secondary | ICD-10-CM | POA: Diagnosis not present

## 2019-09-09 NOTE — Telephone Encounter (Signed)
Pt called wanting to know why "you all wont send my medicine in" called pharmacy and spoke to pharmacist, pt has refills on all meds, she will get the 2 pt named ready- crestor and glipizide. Pt is informed and ask to call for anything he is concerned about. He is agreeable

## 2019-09-11 DIAGNOSIS — M542 Cervicalgia: Secondary | ICD-10-CM | POA: Diagnosis not present

## 2019-09-11 DIAGNOSIS — M5030 Other cervical disc degeneration, unspecified cervical region: Secondary | ICD-10-CM | POA: Diagnosis not present

## 2019-09-11 DIAGNOSIS — M6281 Muscle weakness (generalized): Secondary | ICD-10-CM | POA: Diagnosis not present

## 2019-09-16 DIAGNOSIS — M5030 Other cervical disc degeneration, unspecified cervical region: Secondary | ICD-10-CM | POA: Diagnosis not present

## 2019-09-16 DIAGNOSIS — M6281 Muscle weakness (generalized): Secondary | ICD-10-CM | POA: Diagnosis not present

## 2019-09-16 DIAGNOSIS — M542 Cervicalgia: Secondary | ICD-10-CM | POA: Diagnosis not present

## 2019-09-18 DIAGNOSIS — M6281 Muscle weakness (generalized): Secondary | ICD-10-CM | POA: Diagnosis not present

## 2019-09-18 DIAGNOSIS — M542 Cervicalgia: Secondary | ICD-10-CM | POA: Diagnosis not present

## 2019-09-18 DIAGNOSIS — M5030 Other cervical disc degeneration, unspecified cervical region: Secondary | ICD-10-CM | POA: Diagnosis not present

## 2019-09-23 ENCOUNTER — Encounter: Payer: Medicare Other | Admitting: Internal Medicine

## 2019-09-23 DIAGNOSIS — M542 Cervicalgia: Secondary | ICD-10-CM | POA: Diagnosis not present

## 2019-09-23 DIAGNOSIS — M6281 Muscle weakness (generalized): Secondary | ICD-10-CM | POA: Diagnosis not present

## 2019-09-23 DIAGNOSIS — M5030 Other cervical disc degeneration, unspecified cervical region: Secondary | ICD-10-CM | POA: Diagnosis not present

## 2019-09-25 DIAGNOSIS — M5030 Other cervical disc degeneration, unspecified cervical region: Secondary | ICD-10-CM | POA: Diagnosis not present

## 2019-09-25 DIAGNOSIS — M542 Cervicalgia: Secondary | ICD-10-CM | POA: Diagnosis not present

## 2019-09-25 DIAGNOSIS — M6281 Muscle weakness (generalized): Secondary | ICD-10-CM | POA: Diagnosis not present

## 2019-10-01 DIAGNOSIS — M5030 Other cervical disc degeneration, unspecified cervical region: Secondary | ICD-10-CM | POA: Diagnosis not present

## 2019-10-01 DIAGNOSIS — M6281 Muscle weakness (generalized): Secondary | ICD-10-CM | POA: Diagnosis not present

## 2019-10-01 DIAGNOSIS — M542 Cervicalgia: Secondary | ICD-10-CM | POA: Diagnosis not present

## 2019-10-03 DIAGNOSIS — M542 Cervicalgia: Secondary | ICD-10-CM | POA: Diagnosis not present

## 2019-10-03 DIAGNOSIS — M6281 Muscle weakness (generalized): Secondary | ICD-10-CM | POA: Diagnosis not present

## 2019-10-03 DIAGNOSIS — M5030 Other cervical disc degeneration, unspecified cervical region: Secondary | ICD-10-CM | POA: Diagnosis not present

## 2019-10-05 ENCOUNTER — Other Ambulatory Visit: Payer: Medicare Other

## 2019-10-08 DIAGNOSIS — M6281 Muscle weakness (generalized): Secondary | ICD-10-CM | POA: Diagnosis not present

## 2019-10-08 DIAGNOSIS — M5030 Other cervical disc degeneration, unspecified cervical region: Secondary | ICD-10-CM | POA: Diagnosis not present

## 2019-10-08 DIAGNOSIS — M542 Cervicalgia: Secondary | ICD-10-CM | POA: Diagnosis not present

## 2019-10-11 ENCOUNTER — Ambulatory Visit (INDEPENDENT_AMBULATORY_CARE_PROVIDER_SITE_OTHER): Payer: Medicare Other | Admitting: Internal Medicine

## 2019-10-11 ENCOUNTER — Other Ambulatory Visit: Payer: Self-pay

## 2019-10-11 VITALS — BP 158/96 | HR 82 | Temp 98.1°F | Ht 68.0 in | Wt 225.2 lb

## 2019-10-11 DIAGNOSIS — E119 Type 2 diabetes mellitus without complications: Secondary | ICD-10-CM | POA: Diagnosis not present

## 2019-10-11 DIAGNOSIS — E1165 Type 2 diabetes mellitus with hyperglycemia: Secondary | ICD-10-CM

## 2019-10-11 DIAGNOSIS — E118 Type 2 diabetes mellitus with unspecified complications: Secondary | ICD-10-CM

## 2019-10-11 DIAGNOSIS — IMO0002 Reserved for concepts with insufficient information to code with codable children: Secondary | ICD-10-CM

## 2019-10-11 DIAGNOSIS — N400 Enlarged prostate without lower urinary tract symptoms: Secondary | ICD-10-CM | POA: Diagnosis not present

## 2019-10-11 DIAGNOSIS — I10 Essential (primary) hypertension: Secondary | ICD-10-CM

## 2019-10-11 DIAGNOSIS — M542 Cervicalgia: Secondary | ICD-10-CM

## 2019-10-11 LAB — POCT GLYCOSYLATED HEMOGLOBIN (HGB A1C): Hemoglobin A1C: 7.4 % — AB (ref 4.0–5.6)

## 2019-10-11 LAB — GLUCOSE, CAPILLARY: Glucose-Capillary: 150 mg/dL — ABNORMAL HIGH (ref 70–99)

## 2019-10-11 MED ORDER — ACCU-CHEK GUIDE VI STRP: ORAL_STRIP | 12 refills | Status: AC

## 2019-10-11 MED ORDER — ACCU-CHEK SOFTCLIX LANCETS MISC: 12 refills | Status: AC

## 2019-10-11 MED ORDER — TAMSULOSIN HCL 0.4 MG PO CAPS: 0.4000 mg | ORAL_CAPSULE | Freq: Every day | ORAL | 1 refills | Status: DC

## 2019-10-11 MED ORDER — AMLODIPINE BESYLATE 10 MG PO TABS: 10.0000 mg | ORAL_TABLET | Freq: Every day | ORAL | 1 refills | Status: DC

## 2019-10-11 NOTE — Progress Notes (Signed)
Internal Medicine Clinic Attending  Case discussed with Dr. Krienke at the time of the visit.  We reviewed the resident's history and exam and pertinent patient test results.  I agree with the assessment, diagnosis, and plan of care documented in the resident's note.    

## 2019-10-11 NOTE — Progress Notes (Signed)
   CC: Hypertension, diabetes, and BPH  HPI:  [name] is a 64 y.o. with history of CAD, hyperlipidemia, hypertension, and diabetes who presents for follow-up of his hypertension and diabetes and BPH.  Past Medical History:  Diagnosis Date  . Club foot of both lower extremities 1958  . Coronary artery disease    Holter monitor 02/2012 - sinus with rare PVC  . CTEV (congenital talipes equinovarus)   . Gout   . Hyperlipidemia   . Hypertension   . Myocardial infarction [facility]) ~ 2007   "mild"  . Osteomyelitis (HCC)   . Pneumonia 2000's   "couple times"  . Type II diabetes mellitus (HCC) 2011   Review of Systems:  Constitutional: Negative for chills and fever.  Respiratory: Negative for shortness of breath.   Cardiovascular: Negative for chest pain and leg swelling.  Gastrointestinal: Negative for abdominal pain, nausea and vomiting.  GU: Reports polyuria and nocturia, issues with urinary urgency. . Neurological: Negative for dizziness and headaches.   Physical Exam:  Vitals:   10/11/19 1011 10/11/19 1115  BP: (!) 173/96 (!) 158/96  Pulse: 88 82  Temp: 98.1 F (36.7 C)   TempSrc: Oral   SpO2: 100%   Weight: 225 lb 3.2 oz (102.2 kg)   Height: 5\' 8"  (1.727 m)    Physical Exam  Constitutional: He is oriented to person, place, and time and well-developed, well-nourished, and in no distress.  HENT:  Neck brace in place  Cardiovascular: Normal rate, regular rhythm and normal heart sounds.  Pulmonary/Chest: Effort normal and breath sounds normal. No respiratory distress.  Abdominal: Soft. Bowel sounds are normal. He exhibits no distension.  Musculoskeletal:        General: No tenderness or edema.  Neurological: He is alert and oriented to person, place, and time.  Skin: Skin is warm and dry.  Psychiatric: Mood and affect normal.    Assessment & Plan:   See Encounters Tab for problem based charting.  Patient discussed with Dr. 
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
## 2019-10-11 NOTE — Assessment & Plan Note (Signed)
Patient endorses some urinary urgency and issues with rapid starting and stopping of his urinary flow, denies any straining, dysuria, hematuria, or feeling of retention.  Patient has a history of BPH and is supposed to be on Flomax, he reports that he does not have this medication at home.   -Refill Flomax today

## 2019-10-11 NOTE — Assessment & Plan Note (Signed)
Patient is currently on Diovan 160 mg daily, Coreg 12.5 mg twice daily, and amlodipine 5 mg daily.  He denies any issues taking his medications, denies any side effects.  Denies any chest pain, headaches, lightheadedness, dizziness, or other symptoms.  He took his medications this morning.  Blood pressure today is elevated at 173/96, repeat was 158/96.  He is having some pain however still significantly elevated.  Discussed increasing his amlodipine and is in agreement.  -Continue Diovan 160 mg daily -Continue Coreg 12.5 mg twice daily -Increase amlodipine to 10 mg daily -Return to clinic in 1 month to repeat blood pressure

## 2019-10-11 NOTE — Patient Instructions (Addendum)
[name],  It was a pleasure to see you today. Thank you for coming in.   Today we discussed your diabetes. In regards to this please continue your current medications. Check your blood sugars as needed if you feel like you have high or low blood sugars.    We also discussed your blood pressure, it was a little high today. Please increase your amldopine to 10 mg daily, continue taking the coreg and valsartan.   I refilled the Flomax for your urinary symptoms.   Please return to clinic in 1 month or sooner if needed.   Thank you again for coming in.   Claudean Severance.D.

## 2019-10-11 NOTE — Assessment & Plan Note (Addendum)
Patient is currently on Victoza 1.8 mg daily, glipizide 10 mg twice daily, and Jardiance 10 mg daily.  He does endorse some polyuria and nocturia, does not have a meter at home at this time.  Does not check his blood sugars at home.  Last A1c was 6.9.  Provided patient with meter and will repeat A1c today. Due for eye exam, missed it earlier this year after being in the hospital, advised to call to reschedule.  -Continue Victoza 1.7 mg daily -Continue glipizide 10 mg twice daily -Continue Jardiance 10 mg daily -Check A1c

## 2019-10-14 DIAGNOSIS — M6281 Muscle weakness (generalized): Secondary | ICD-10-CM | POA: Diagnosis not present

## 2019-10-14 DIAGNOSIS — M542 Cervicalgia: Secondary | ICD-10-CM | POA: Diagnosis not present

## 2019-10-14 DIAGNOSIS — M5030 Other cervical disc degeneration, unspecified cervical region: Secondary | ICD-10-CM | POA: Diagnosis not present

## 2019-10-16 DIAGNOSIS — M542 Cervicalgia: Secondary | ICD-10-CM | POA: Diagnosis not present

## 2019-10-16 DIAGNOSIS — M5030 Other cervical disc degeneration, unspecified cervical region: Secondary | ICD-10-CM | POA: Diagnosis not present

## 2019-10-16 DIAGNOSIS — M6281 Muscle weakness (generalized): Secondary | ICD-10-CM | POA: Diagnosis not present

## 2019-10-21 DIAGNOSIS — M542 Cervicalgia: Secondary | ICD-10-CM | POA: Diagnosis not present

## 2019-10-21 DIAGNOSIS — M6281 Muscle weakness (generalized): Secondary | ICD-10-CM | POA: Diagnosis not present

## 2019-10-21 DIAGNOSIS — M5030 Other cervical disc degeneration, unspecified cervical region: Secondary | ICD-10-CM | POA: Diagnosis not present

## 2019-10-22 ENCOUNTER — Other Ambulatory Visit: Payer: Self-pay | Admitting: Internal Medicine

## 2019-10-22 DIAGNOSIS — E119 Type 2 diabetes mellitus without complications: Secondary | ICD-10-CM

## 2019-10-22 MED ORDER — EMPAGLIFLOZIN 10 MG PO TABS: 10.0000 mg | ORAL_TABLET | Freq: Every day | ORAL | 1 refills | Status: DC

## 2019-10-22 NOTE — Telephone Encounter (Signed)
Need refill on empagliflozin (JARDIANCE) 10 MG TABS tablet ;pt contact [phone]   [facility] - [place], [place] - [address]

## 2019-10-28 DIAGNOSIS — M542 Cervicalgia: Secondary | ICD-10-CM | POA: Diagnosis not present

## 2019-10-28 DIAGNOSIS — M6281 Muscle weakness (generalized): Secondary | ICD-10-CM | POA: Diagnosis not present

## 2019-10-28 DIAGNOSIS — M5030 Other cervical disc degeneration, unspecified cervical region: Secondary | ICD-10-CM | POA: Diagnosis not present

## 2019-11-02 ENCOUNTER — Other Ambulatory Visit: Payer: Medicare Other

## 2019-11-04 DIAGNOSIS — M542 Cervicalgia: Secondary | ICD-10-CM | POA: Diagnosis not present

## 2019-11-04 DIAGNOSIS — M5030 Other cervical disc degeneration, unspecified cervical region: Secondary | ICD-10-CM | POA: Diagnosis not present

## 2019-11-04 DIAGNOSIS — M6281 Muscle weakness (generalized): Secondary | ICD-10-CM | POA: Diagnosis not present

## 2019-11-06 DIAGNOSIS — M6281 Muscle weakness (generalized): Secondary | ICD-10-CM | POA: Diagnosis not present

## 2019-11-06 DIAGNOSIS — M5030 Other cervical disc degeneration, unspecified cervical region: Secondary | ICD-10-CM | POA: Diagnosis not present

## 2019-11-06 DIAGNOSIS — M542 Cervicalgia: Secondary | ICD-10-CM | POA: Diagnosis not present

## 2019-11-15 ENCOUNTER — Encounter: Payer: Medicare Other | Admitting: Internal Medicine

## 2019-11-15 LAB — HEMOGLOBIN A1C: Hemoglobin A1C: 7.5

## 2019-11-26 ENCOUNTER — Telehealth: Payer: Self-pay | Admitting: Dietician

## 2019-11-26 ENCOUNTER — Encounter: Payer: Self-pay | Admitting: Internal Medicine

## 2019-11-26 ENCOUNTER — Other Ambulatory Visit: Payer: Self-pay

## 2019-11-26 ENCOUNTER — Ambulatory Visit (INDEPENDENT_AMBULATORY_CARE_PROVIDER_SITE_OTHER): Payer: Medicare Other | Admitting: Internal Medicine

## 2019-11-26 DIAGNOSIS — N400 Enlarged prostate without lower urinary tract symptoms: Secondary | ICD-10-CM

## 2019-11-26 DIAGNOSIS — N529 Male erectile dysfunction, unspecified: Secondary | ICD-10-CM | POA: Diagnosis not present

## 2019-11-26 DIAGNOSIS — E1165 Type 2 diabetes mellitus with hyperglycemia: Secondary | ICD-10-CM

## 2019-11-26 DIAGNOSIS — M10042 Idiopathic gout, left hand: Secondary | ICD-10-CM

## 2019-11-26 DIAGNOSIS — R350 Frequency of micturition: Secondary | ICD-10-CM

## 2019-11-26 DIAGNOSIS — F528 Other sexual dysfunction not due to a substance or known physiological condition: Secondary | ICD-10-CM

## 2019-11-26 DIAGNOSIS — IMO0002 Reserved for concepts with insufficient information to code with codable children: Secondary | ICD-10-CM

## 2019-11-26 DIAGNOSIS — R0789 Other chest pain: Secondary | ICD-10-CM

## 2019-11-26 DIAGNOSIS — E119 Type 2 diabetes mellitus without complications: Secondary | ICD-10-CM

## 2019-11-26 DIAGNOSIS — I10 Essential (primary) hypertension: Secondary | ICD-10-CM

## 2019-11-26 MED ORDER — ALLOPURINOL 100 MG PO TABS: 100.0000 mg | ORAL_TABLET | Freq: Every day | ORAL | 3 refills | Status: DC

## 2019-11-26 MED ORDER — TAMSULOSIN HCL 0.4 MG PO CAPS: 0.4000 mg | ORAL_CAPSULE | Freq: Every day | ORAL | 1 refills | Status: DC

## 2019-11-26 MED ORDER — EMPAGLIFLOZIN 10 MG PO TABS: 10.0000 mg | ORAL_TABLET | Freq: Every day | ORAL | 1 refills | Status: DC

## 2019-11-26 MED ORDER — METHOCARBAMOL 500 MG PO TABS: 500.0000 mg | ORAL_TABLET | Freq: Three times a day (TID) | ORAL | 1 refills | Status: DC | PRN

## 2019-11-26 MED ORDER — VALSARTAN 160 MG PO TABS: 160.0000 mg | ORAL_TABLET | Freq: Every day | ORAL | 3 refills | Status: DC

## 2019-11-26 MED ORDER — NITROGLYCERIN 0.4 MG SL SUBL: SUBLINGUAL_TABLET | SUBLINGUAL | 1 refills | Status: DC

## 2019-11-26 MED ORDER — GLIPIZIDE 10 MG PO TABS: ORAL_TABLET | ORAL | 0 refills | Status: DC

## 2019-11-26 MED ORDER — ALBUTEROL SULFATE HFA 108 (90 BASE) MCG/ACT IN AERS: 2.0000 | INHALATION_SPRAY | RESPIRATORY_TRACT | 1 refills | Status: DC | PRN

## 2019-11-26 MED ORDER — COLCHICINE 0.6 MG PO TABS: ORAL_TABLET | ORAL | 0 refills | Status: DC

## 2019-11-26 NOTE — Assessment & Plan Note (Addendum)
BP 113/65 at this visit. Patient was evaluated one month ago for hypertension and diabetes follow up. Noted to be hypertensive at which point his amlodipine was increased to 10mg  daily. He notes he is doing well and denies any side effects of the medications. He denies any headaches, lightheadedness, dizziness, chest pain or worsening dyspnea. No lower extremity swelling noted on examination.  Plan: Continue diovan 160mg  daily, carvedilol 12.5mg  twice daily, amlodipine 10mg  daily F/u in 3 months

## 2019-11-26 NOTE — Telephone Encounter (Signed)
Appointment made for Thursday to help Daniel Perkins with his glucometer per Dr. Mcarthur Rossetti. Request referral

## 2019-11-26 NOTE — Progress Notes (Signed)
   CC: hypertension f/u  HPI:  Daniel Perkins is a 64 y.o. male with PMHx as listed below presenting for follow up of his hypertension. Patient denies any acute concerns today. Please see problem based charting for complete assessment and plan.  Past Medical History:  Diagnosis Date  . Club foot of both lower extremities 1958  . Coronary artery disease    Holter monitor 02/2012 - sinus with rare PVC  . CTEV (congenital talipes equinovarus)   . Gout   . Hyperlipidemia   . Hypertension   . Myocardial infarction [facility]) ~ 2007   "mild"  . Osteomyelitis (HCC)   . Pneumonia 2000's   "couple times"  . Type II diabetes mellitus (HCC) 2011   Review of Systems:  Negative except as stated in HPI.  Physical Exam:  Vitals:   11/26/19 1312  BP: 113/65  Pulse: 78  Temp: 99.3 F (37.4 C)  TempSrc: Oral  SpO2: 99%  Weight: 225 lb 1.6 oz (102.1 kg)  Height: 5\' 8"  (1.727 m)    Physical Exam  Constitutional: Appears well-developed and well-nourished. No distress.  HENT: Normocephalic and atraumatic, EOMI, conjunctiva normal, moist mucous membranes Cardiovascular: Normal rate, regular rhythm, S1 and S2 present, no murmurs, rubs, gallops.  Distal pulses intact Respiratory: No respiratory distress, no accessory muscle use.  Effort is normal.  Lungs are clear to auscultation bilaterally. GI: Nondistended, soft, nontender to palpation, normal active bowel sounds Musculoskeletal: Normal bulk and tone.  No peripheral edema noted. Neurological: Is alert and oriented x4, no apparent focal deficits noted. Skin: Warm and dry.  No rash, erythema, lesions noted. Psychiatric: Normal mood and affect. Behavior is normal. Judgment and thought content normal.    Assessment & Plan:   See Encounters Tab for problem based charting.  Patient discussed with Dr. 

## 2019-11-26 NOTE — Patient Instructions (Addendum)
Daniel Perkins,  It was a pleasure seeing you in clinic. Today we discussed:   Blood pressure: Your blood pressure is improved today. Please continue to take your amlodipine 10mg  daily, carvedilol 12.5mg  twice daily, and valsartan 160mg  daily.   Diabetes: Please continue to take your Jardiance 10mg  daily, Glipizide 10mg  twice daily, and Victoza 1.8mg  daily as instructed. Please schedule an appointment to help with checking your blood sugars at home.  Please schedule a visit with the eye doctor at your earliest convenience.   Frequent night time urination and erectile dysfunction: Please continue to take your Flomax daily. Please schedule an appointment to follow up with Dr. at [facility] at [phone].   If you have any questions or concerns, please call our clinic at [phone] between 9am-5pm and after hours call [phone] and ask for the internal medicine resident on call. If you feel you are having a medical emergency please call 911.   Thank you, we look forward to helping you remain healthy!

## 2019-11-27 ENCOUNTER — Other Ambulatory Visit: Payer: Self-pay | Admitting: *Deleted

## 2019-11-27 MED ORDER — ALBUTEROL SULFATE HFA 108 (90 BASE) MCG/ACT IN AERS: 1.0000 | INHALATION_SPRAY | Freq: Four times a day (QID) | RESPIRATORY_TRACT | 1 refills | Status: DC | PRN

## 2019-11-27 NOTE — Assessment & Plan Note (Signed)
Patient endorses ongoing issues with erectile dysfunction. He is able to achieve erection but unable to maintain it. He does endorse morning erections. He was evaluated by Dr. Alvester Morin previously and would like to return to him for consideration of possible injections.   Plan: Continue to monitor Patient encouraged to schedule visit with Dr. Alvester Morin

## 2019-11-27 NOTE — Telephone Encounter (Signed)
Ventolin HFA changed to ProAir HFA. Thank you

## 2019-11-27 NOTE — Telephone Encounter (Signed)
Fax from [facility] - stated Albuterol inh is not covered by pt's insurance; please change to ProAir, send new rx. Thanks

## 2019-11-27 NOTE — Assessment & Plan Note (Signed)
Ongoing nocturia without straining, dysuria, hematuria or urinary retention. He has history of BPH and his flomax was refilled at the last visit; However, patient notes that he is not taking this.   Plan: - Encouraged for medication compliance

## 2019-11-27 NOTE — Assessment & Plan Note (Signed)
Most recent HbA1c 7.5 on glipizide 10mg  twice daily, jardiance 10mg  daily and Victoza 1.8mg  daily. He was provided a blood glucose monitor at the last visit; however, notes that he has not been checking his blood sugars at home as he does not know how to use it. He endorses medication adherence and denies any new symptoms. He has not been able to schedule an appointment with the ophthalmologist yet. Encouraged him to do so.   Plan: Continue current regimen HbA1c in 3 months F/u with for blood glucose meter instructions

## 2019-11-27 NOTE — Telephone Encounter (Signed)
Done

## 2019-11-28 ENCOUNTER — Encounter: Payer: Medicare Other | Admitting: Dietician

## 2019-11-28 ENCOUNTER — Ambulatory Visit: Payer: Medicare Other | Admitting: Dietician

## 2019-12-03 NOTE — Progress Notes (Signed)
Internal Medicine Clinic Attending ° °Case discussed with Dr. Aslam  At the time of the visit.  We reviewed the resident’s history and exam and pertinent patient test results.  I agree with the assessment, diagnosis, and plan of care documented in the resident’s note.  °

## 2019-12-07 ENCOUNTER — Other Ambulatory Visit: Payer: Medicare Other

## 2019-12-26 NOTE — Addendum Note (Signed)
Addended by: Neomia Dear on: 12/26/2019 08:21 PM   Modules accepted: Orders

## 2020-01-04 ENCOUNTER — Other Ambulatory Visit: Payer: Medicare Other

## 2020-01-23 ENCOUNTER — Ambulatory Visit
Admission: RE | Admit: 2020-01-23 | Discharge: 2020-01-23 | Disposition: A | Discharge status: Home / Self Care | Payer: Medicare Other | Source: Ambulatory Visit | Attending: Orthopaedic Surgery | Admitting: Orthopaedic Surgery

## 2020-01-23 DIAGNOSIS — M5030 Other cervical disc degeneration, unspecified cervical region: Secondary | ICD-10-CM

## 2020-01-29 ENCOUNTER — Other Ambulatory Visit: Payer: Self-pay

## 2020-01-29 DIAGNOSIS — I10 Essential (primary) hypertension: Secondary | ICD-10-CM

## 2020-01-29 MED ORDER — CARVEDILOL 12.5 MG PO TABS: 12.5000 mg | ORAL_TABLET | Freq: Two times a day (BID) | ORAL | 1 refills | Status: DC

## 2020-02-13 ENCOUNTER — Ambulatory Visit: Payer: Medicare Other | Admitting: Dietician

## 2020-02-13 ENCOUNTER — Ambulatory Visit (INDEPENDENT_AMBULATORY_CARE_PROVIDER_SITE_OTHER): Payer: Medicare Other | Admitting: Internal Medicine

## 2020-02-13 ENCOUNTER — Encounter: Payer: Self-pay | Admitting: Dietician

## 2020-02-13 ENCOUNTER — Encounter: Payer: Self-pay | Admitting: Internal Medicine

## 2020-02-13 ENCOUNTER — Other Ambulatory Visit: Payer: Self-pay

## 2020-02-13 VITALS — BP 154/96 | HR 90 | Temp 97.6°F | Ht 68.0 in | Wt 226.1 lb

## 2020-02-13 DIAGNOSIS — I10 Essential (primary) hypertension: Secondary | ICD-10-CM | POA: Diagnosis not present

## 2020-02-13 DIAGNOSIS — E1165 Type 2 diabetes mellitus with hyperglycemia: Secondary | ICD-10-CM

## 2020-02-13 DIAGNOSIS — G4739 Other sleep apnea: Secondary | ICD-10-CM

## 2020-02-13 DIAGNOSIS — M542 Cervicalgia: Secondary | ICD-10-CM

## 2020-02-13 DIAGNOSIS — IMO0002 Reserved for concepts with insufficient information to code with codable children: Secondary | ICD-10-CM

## 2020-02-13 DIAGNOSIS — E785 Hyperlipidemia, unspecified: Secondary | ICD-10-CM | POA: Diagnosis not present

## 2020-02-13 DIAGNOSIS — Z0000 Encounter for general adult medical examination without abnormal findings: Secondary | ICD-10-CM

## 2020-02-13 DIAGNOSIS — N400 Enlarged prostate without lower urinary tract symptoms: Secondary | ICD-10-CM

## 2020-02-13 DIAGNOSIS — Z0001 Encounter for general adult medical examination with abnormal findings: Secondary | ICD-10-CM

## 2020-02-13 DIAGNOSIS — E119 Type 2 diabetes mellitus without complications: Secondary | ICD-10-CM | POA: Diagnosis not present

## 2020-02-13 LAB — POCT GLYCOSYLATED HEMOGLOBIN (HGB A1C): Hemoglobin A1C: 6.8 % — AB (ref 4.0–5.6)

## 2020-02-13 LAB — GLUCOSE, CAPILLARY: Glucose-Capillary: 69 mg/dL — ABNORMAL LOW (ref 70–99)

## 2020-02-13 MED ORDER — VALSARTAN 160 MG PO TABS: 160.0000 mg | ORAL_TABLET | Freq: Every day | ORAL | 3 refills | Status: DC

## 2020-02-13 MED ORDER — ALBUTEROL SULFATE HFA 108 (90 BASE) MCG/ACT IN AERS: 1.0000 | INHALATION_SPRAY | Freq: Four times a day (QID) | RESPIRATORY_TRACT | 1 refills | Status: DC | PRN

## 2020-02-13 MED ORDER — EMPAGLIFLOZIN 10 MG PO TABS: 10.0000 mg | ORAL_TABLET | Freq: Every day | ORAL | 1 refills | Status: DC

## 2020-02-13 MED ORDER — ROSUVASTATIN CALCIUM 20 MG PO TABS: 20.0000 mg | ORAL_TABLET | Freq: Every day | ORAL | 1 refills | Status: DC

## 2020-02-13 MED ORDER — CARVEDILOL 12.5 MG PO TABS: 12.5000 mg | ORAL_TABLET | Freq: Two times a day (BID) | ORAL | 1 refills | Status: DC

## 2020-02-13 NOTE — Patient Instructions (Addendum)
[name],  It was a pleasure seeing you in clinic. Today we discussed:   Diabetes: Your A1c is 6.8 today. Great job! At this time, we will hold off on the jardiance. Please continue your Victoza and Glipizide.  Hypertension: Please continue your valsartan, carvedilol, and amlodipine.   You received your flu shot today. Please send in your stool card to screen for colon cancer. I am also ordering a sleep study. Please schedule this as soon as possible.   Please follow up with Dr. Alvester Morin at [facility]. You can call him at [phone] to schedule an appointment   If you have any questions or concerns, please call our clinic at [phone] between 9am-5pm and after hours call [phone] and ask for the internal medicine resident on call. If you feel you are having a medical emergency please call 911.   Thank you, we look forward to helping you remain healthy!

## 2020-02-13 NOTE — Progress Notes (Signed)
Diabetes Self Management Education & Support visit:  Last visit with diabetes educator 2019.  Referral placed  11/26/19 but patient cancelled the appointment on 11/28/19. He may prefer same day appointments. Will call patient to offer to reschedule missed appointment for annual Diabetes Self Management Education & Support  Asked to assist patient with his new glucometer today. He has an accu chek guideme meter. He had already insterd a lancet into the lancing device, but could not get enough blood because he had the depth on level 2. We turned it up to level 3 and he had n problems checking his blood sugar( after we extracted his strips that were stuck from the container). The result was 74 mg/dl. ( he had a previous asymptomatic low, blood sugar of 69 for which he was treated with 4 oz orange juice.  He repeated back the demonstration, verbalizing understanding of how to use the lancing device and meter to check his blood sugar. He has active prescriptions for testing supplies.   Norm Parcel, RD 02/13/2020 1:14 PM.

## 2020-02-14 LAB — LIPID PANEL
Chol/HDL Ratio: 5.5 ratio — ABNORMAL HIGH (ref 0.0–5.0)
Cholesterol, Total: 213 mg/dL — ABNORMAL HIGH (ref 100–199)
HDL: 39 mg/dL — ABNORMAL LOW (ref >39)
LDL Chol Calc (NIH): 136 mg/dL — ABNORMAL HIGH (ref 0–99)
Triglycerides: 213 mg/dL — ABNORMAL HIGH (ref 0–149)
VLDL Cholesterol Cal: 38 mg/dL (ref 5–40)

## 2020-02-14 NOTE — Progress Notes (Signed)
   CC: diabetes f/u  HPI:  Mr.Kale Eicher is a 64 y.o. male with PMHx as listed below presenting for follow up of his diabetes. No acute concerns at this time. Please see problem based charting for complete assessment and plan.  Past Medical History:  Diagnosis Date  . Club foot of both lower extremities 1958  . Coronary artery disease    Holter monitor 02/2012 - sinus with rare PVC  . CTEV (congenital talipes equinovarus)   . Gout   . Hyperlipidemia   . Hypertension   . Myocardial infarction [facility]) ~ 2007   "mild"  . Osteomyelitis (HCC)   . Pneumonia 2000's   "couple times"  . Type II diabetes mellitus (HCC) 2011   Review of Systems:  Negative except as stated in HPI.  Physical Exam:  Vitals:   02/13/20 1112  BP: (!) 154/96  Pulse: 90  Temp: 97.6 F (36.4 C)  TempSrc: Oral  SpO2: 99%  Weight: 226 lb 1.6 oz (102.6 kg)  Height: 5\' 8"  (1.727 m)   Physical Exam  Constitutional: Obese elderly male; No distress.  HENT: Normocephalic and atraumatic, EOMI, conjunctiva normal, moist mucous membranes Cardiovascular: Normal rate, regular rhythm, S1 and S2 present, systolic ejection murmur, no rubs or gallops Distal pulses intact Respiratory: No respiratory distress, no accessory muscle use.  Effort is normal.  Lungs are clear to auscultation bilaterally. Musculoskeletal: Normal bulk and tone.  No peripheral edema noted. Neurological: Is alert and oriented x4, no apparent focal deficits noted. Skin: Warm and dry.  No rash, erythema, lesions noted.  Assessment & Plan:   See Encounters Tab for problem based charting.  Patient discussed with Dr. 

## 2020-02-14 NOTE — Assessment & Plan Note (Signed)
Lipid Panel     Component Value Date/Time   CHOL 213 (H) 02/13/2020 1116   TRIG 213 (H) 02/13/2020 1116   HDL 39 (L) 02/13/2020 1116   CHOLHDL 5.5 (H) 02/13/2020 1116   CHOLHDL 6.1 06/30/2014 1634   VLDL 45 (H) 06/30/2014 1634   LDLCALC 136 (H) 02/13/2020 1116   LABVLDL 38 02/13/2020 1116   10 yr ASCVD risk is 38.4%. Patient is currently on Crestor 20mg  daily.   Plan: Continue Crestor 20mg  daily and aspirin 81mg  daily

## 2020-02-14 NOTE — Assessment & Plan Note (Signed)
A1c 6.7 at this visit. Patient is on glipizide 10mg  bid, jardiance 10mg  daily and Victoza 1.8mg  daily. Patient notes he has ran out of his jardiance. He was provided glucose monitor and has been counseled on how to use this multiple times by but notes that he is still having trouble using it.  Given that his A1c is <7, will hold off on resumption of Jardiance at this time. Will continue with glipizide 10mg  bid and Victoza 1.8mg  daily.  was able to visit with patient during this visit for instructions on how to check his blood glucose at home.   Plan: Continue glipizide 10mg  bid and Victoza 1.8mg  daily Discontinue Jardiance at this time. Will reassess need for this and provide samples if indicated at next HbA1c check  F/u HbA1c in 3 months

## 2020-02-14 NOTE — Assessment & Plan Note (Signed)
Patient with history of BPH endorsing ongoing symptoms with nocturia; however, denies straining, urinary retention or urgency or hematuria. He notes compliance with flomax but has not followed up with the urologist yet. Patient encouraged to follow up with urology.  Plan: Continue flomax 0.4mg  daily F/u with urology

## 2020-02-14 NOTE — Assessment & Plan Note (Signed)
Patient is currently following with [facility] for treatment of this. He notes that they are planning for cervical spine injections this upcoming week.   Plan: Continue to monitor

## 2020-02-14 NOTE — Assessment & Plan Note (Signed)
Patient continuing to complain of dyspnea with mild exertion. He endorses snoring at night and daytime somnolence. Given body habitus, he is at increased risk for sleep apnea. Sleep study ordered previously but he has not been able to get it done.   Plan: Sleep study ordered and patient encouraged to follow up with this.

## 2020-02-14 NOTE — Assessment & Plan Note (Addendum)
iFIT testing ordered for colon cancer screening  Patient received flu vaccine at this visit.

## 2020-02-14 NOTE — Assessment & Plan Note (Signed)
BP Readings from Last 3 Encounters:  02/13/20 (!) 154/96  11/26/19 113/65  10/11/19 (!) 158/96   Plan:  Continue diovan 160mg  daily, carvedilol 12.5mg  bid, and amlodipine 10mg  daily F/u in 3 months

## 2020-02-17 ENCOUNTER — Encounter (HOSPITAL_COMMUNITY): Payer: Self-pay | Admitting: Emergency Medicine

## 2020-02-17 ENCOUNTER — Other Ambulatory Visit: Payer: Self-pay

## 2020-02-17 ENCOUNTER — Emergency Department (HOSPITAL_COMMUNITY): Payer: Medicare Other

## 2020-02-17 ENCOUNTER — Emergency Department (HOSPITAL_COMMUNITY)
Admission: EM | Admit: 2020-02-17 | Discharge: 2020-02-17 | Disposition: A | Discharge status: Home / Self Care | Payer: Medicare Other | Attending: Emergency Medicine | Admitting: Emergency Medicine

## 2020-02-17 DIAGNOSIS — S61213A Laceration without foreign body of left middle finger without damage to nail, initial encounter: Secondary | ICD-10-CM | POA: Diagnosis not present

## 2020-02-17 DIAGNOSIS — Z87891 Personal history of nicotine dependence: Secondary | ICD-10-CM | POA: Insufficient documentation

## 2020-02-17 DIAGNOSIS — Z7984 Long term (current) use of oral hypoglycemic drugs: Secondary | ICD-10-CM | POA: Insufficient documentation

## 2020-02-17 DIAGNOSIS — Z23 Encounter for immunization: Secondary | ICD-10-CM | POA: Diagnosis not present

## 2020-02-17 DIAGNOSIS — E119 Type 2 diabetes mellitus without complications: Secondary | ICD-10-CM | POA: Diagnosis not present

## 2020-02-17 DIAGNOSIS — N182 Chronic kidney disease, stage 2 (mild): Secondary | ICD-10-CM | POA: Insufficient documentation

## 2020-02-17 DIAGNOSIS — Z7982 Long term (current) use of aspirin: Secondary | ICD-10-CM | POA: Diagnosis not present

## 2020-02-17 DIAGNOSIS — W272XXA Contact with scissors, initial encounter: Secondary | ICD-10-CM | POA: Insufficient documentation

## 2020-02-17 DIAGNOSIS — I1310 Hypertensive heart and chronic kidney disease without heart failure, with stage 1 through stage 4 chronic kidney disease, or unspecified chronic kidney disease: Secondary | ICD-10-CM | POA: Diagnosis not present

## 2020-02-17 DIAGNOSIS — S61215A Laceration without foreign body of left ring finger without damage to nail, initial encounter: Secondary | ICD-10-CM

## 2020-02-17 DIAGNOSIS — S60922A Unspecified superficial injury of left hand, initial encounter: Secondary | ICD-10-CM | POA: Diagnosis present

## 2020-02-17 DIAGNOSIS — Z79899 Other long term (current) drug therapy: Secondary | ICD-10-CM | POA: Diagnosis not present

## 2020-02-17 MED ORDER — LIDOCAINE HCL (PF) 1 % IJ SOLN
5.0000 mL | Freq: Once | INTRAMUSCULAR | Status: AC
  Administered 2020-02-17: 5 mL
  Filled 2020-02-17: qty 5

## 2020-02-17 MED ORDER — TETANUS-DIPHTH-ACELL PERTUSSIS 5-2.5-18.5 LF-MCG/0.5 IM SUSP
0.5000 mL | Freq: Once | INTRAMUSCULAR | Status: AC
  Administered 2020-02-17: 0.5 mL via INTRAMUSCULAR
  Filled 2020-02-17: qty 0.5

## 2020-02-17 MED ORDER — ACETAMINOPHEN 325 MG PO TABS
650.0000 mg | ORAL_TABLET | Freq: Once | ORAL | Status: AC
  Administered 2020-02-17: 650 mg via ORAL
  Filled 2020-02-17: qty 2

## 2020-02-17 NOTE — Discharge Instructions (Signed)
You have been seen for finger laceration.  I have placed stitches in your finger.  Please refrain from washing the wound for the first 24 hours, after that I recommend rinsing and apply new dressings twice a day.  I recommend Tylenol every 6 as needed for pain.  Please follow dosing the back of bottle.  I also recommend that you keep the hand elevated and apply ice to the area as this will decrease inflammation and swelling.    You need to have sutures removed in the next 7 to 10 days.  You may come back to the ED or urgent care or PCP to have them removed.  You have a follow-up appointment with Dr. Janee Morn of hand surgery.  His office will contact you to schedule an appointment.  If you not hear from them with the 2 days please call the office.  Come back to emergency department if develop numbness or tingling in your fingers, you develop worsening swelling, pain, redness, see discharge coming from your finger, develop fever or chills, chest pain, shortness of breath, severe abdominal pain, uncontrolled nausea vomiting diarrhea.

## 2020-02-17 NOTE — ED Notes (Signed)
Pt discharge instructions reviewed with the patient. The patient verbalized understanding. Pt discharged. 

## 2020-02-17 NOTE — ED Triage Notes (Signed)
Pt arrives with [facility] with c/o of laceration of left hand 3rd & 4th digit.  Pt dropped a pair of scissors and upon picking them up cut this hand. Bleeding is controlled at this time.

## 2020-02-17 NOTE — ED Provider Notes (Signed)
[facility] EMERGENCY DEPARTMENT Provider Note   CSN: [id number] Arrival date & time: 02/17/20  4098     History Chief Complaint  Patient presents with  . Extremity Laceration    Daniel Perkins is a 64 y.o. male.  HPI   Patient with significant medical history of CAD, hypertension, diabetes type 2 presents to the emergency department with chief complaint of lacerations to the third and fourth digit of the left hand.  Patient states this happened earlier today when he tried to cut his a towel with a pair of scissors.  He dropped the the scissors and went to retrieve them but accidentally sliced his fingers against the blade.  He denies hitting his head, losing conscious, being on anticoags.  Patient was able to stop the bleeding by applying pressure to it.  He does not remember when his last tetanus shot was and is a diabetic.  He denies headache, fever, chills, shortness of breath, chest pain, dumping, nausea, vomiting, diarrhea.  Past Medical History:  Diagnosis Date  . Club foot of both lower extremities 1958  . Coronary artery disease    Holter monitor 02/2012 - sinus with rare PVC  . CTEV (congenital talipes equinovarus)   . Gout   . Hyperlipidemia   . Hypertension   . Myocardial infarction [facility]) ~ 2007   "mild"  . Osteomyelitis (HCC)   . Pneumonia 2000's   "couple times"  . Type II diabetes mellitus (HCC) 2011    Patient Active Problem List   Diagnosis Date Noted  . Neck pain 08/27/2019  . CKD (chronic kidney disease), 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  . Hepatic steatosis 05/01/2019  . Pneumonia due to COVID-19 virus 04/28/2019  . Prostate hypertrophy 12/06/2018  . Sleep apnea-like behavior 10/01/2014  . Peripheral vascular disease (HCC) 02/10/2014  . Microcytic anemia 02/06/2013  . CTEV (congenital talipes equinovarus)   . Statin intolerance 06/29/2012  . Esophageal reflux 06/29/2012  . Health care maintenance 06/28/2012  . Gout 06/12/2012  .  Obesity, Class II, BMI 35-39.9 02/20/2012  . DOE (dyspnea on exertion) 02/20/2012  . Uncontrolled diabetes mellitus with complications (HCC) 09/06/2009  . ERECTILE DYSFUNCTION, NON-ORGANIC 02/16/2009  . Hyperlipemia 01/14/2009  . HYPERTENSION 01/14/2009    Past Surgical History:  Procedure Laterality Date  . ANTERIOR CERVICAL DECOMP/DISCECTOMY FUSION  X 2  . CARDIAC CATHETERIZATION    . FOOT FRACTURE SURGERY Right 1990's   "pt a steel plate in"  . FOOT SURGERY Left x16   "joints collapsed; keep getting infected"  . POSTERIOR CERVICAL FUSION/FORAMINOTOMY  X 2       Family History  Problem Relation Age of Onset  . Diabetes Mother   . Coronary artery disease Mother        HAS PACEMAKER  . Heart disease Mother   . Heart attack Brother   . Lung disease Neg Hx     Social History   Tobacco Use  . Smoking status: Former Smoker    Packs/day: 1.00    Years: 32.00    Pack years: 32.00    Types: Cigarettes    Quit date: 07/06/2007    Years since quitting: 12.6  . Smokeless tobacco: Never Used  Vaping Use  . Vaping Use: Never used  Substance Use Topics  . Alcohol use: Yes    Alcohol/week: 0.0 standard drinks    Comment: Sometimes beer.  . Drug use: No    Home Medications Prior to Admission medications   Medication Sig Start  Date End Date Taking? Authorizing Provider  Accu-Chek Softclix Lancets lancets Check once a day 10/11/19   Claudean Severance, MD  albuterol [facility] HFA) [phone] Base) MCG/ACT inhaler Inhale 1-2 puffs into the lungs every 6 (six) hours as needed for wheezing or shortness of breath. 02/13/20   Eliezer Bottom, MD  allopurinol (ZYLOPRIM) 100 MG tablet Take 1 tablet (100 mg total) by mouth daily. 11/26/19   Eliezer Bottom, MD  amLODipine (NORVASC) 10 MG tablet Take 1 tablet (10 mg total) by mouth daily. 10/11/19   Claudean Severance, MD  aspirin EC 81 MG tablet Take 1 tablet (81 mg total) by mouth daily. 03/19/15   Deneise Lever, MD  carvedilol (COREG) 12.5 MG  tablet Take 1 tablet (12.5 mg total) by mouth 2 (two) times daily. 02/13/20   Eliezer Bottom, MD  colchicine 0.6 MG tablet TAKE 1 TABLET BY MOUTH  DAILY AS NEEDED FOR GOUT  FLARE 11/26/19   Eliezer Bottom, MD  cyclobenzaprine (FLEXERIL) 10 MG tablet Take 1 tablet (10 mg total) by mouth 3 (three) times daily as needed for muscle spasms. 08/27/19   Eliezer Bottom, MD  glipiZIDE (GLUCOTROL) 10 MG tablet TAKE 1 TABLET BY MOUTH  TWICE A DAY BEFORE A MEAL 11/26/19   Aslam, Leanna Sato, MD  glucose blood (ACCU-CHEK GUIDE) test strip Check once a day 10/11/19   Claudean Severance, MD  Insulin Pen Needle 32G X 4 MM MISC Use to inject victoza one time a day 11/15/17   Tyson Alias, MD  liraglutide (VICTOZA) 18 MG/3ML SOPN Inject 1.8 mg into the skin daily as instructed 06/25/19   Eliezer Bottom, MD  methocarbamol (ROBAXIN) 500 MG tablet Take 1 tablet (500 mg total) by mouth every 8 (eight) hours as needed for muscle spasms. 11/26/19   Eliezer Bottom, MD  nitroGLYCERIN (NITROSTAT) 0.4 MG SL tablet DISSOLVE 1 TABLET UNDER THE TONGUE EVERY 5 MINUTES AS  NEEDED FOR CHEST PAIN , MAX 3 TABLETS IN 15 MINUTES,  CALL 911 IF NO RELIEF. 11/26/19   Eliezer Bottom, MD  oxyCODONE-acetaminophen (PERCOCET) 5-325 MG tablet Take 1 tablet by mouth every 4 (four) hours as needed for severe pain. 07/25/19   Molpus, John, MD  rosuvastatin (CRESTOR) 20 MG tablet Take 1 tablet (20 mg total) by mouth daily. 02/13/20   Eliezer Bottom, MD  tamsulosin (FLOMAX) 0.4 MG CAPS capsule Take 1 capsule (0.4 mg total) by mouth daily. 11/26/19   Eliezer Bottom, MD  valsartan (DIOVAN) 160 MG tablet Take 1 tablet (160 mg total) by mouth daily. 02/13/20   Eliezer Bottom, MD    Allergies    Morphine and Pravastatin sodium  Review of Systems   Review of Systems  Constitutional: Negative for chills and fever.  HENT: Negative for congestion, tinnitus, trouble swallowing and voice change.   Eyes: Negative for visual disturbance.  Respiratory: Negative for cough and shortness  of breath.   Cardiovascular: Negative for chest pain.  Gastrointestinal: Negative for abdominal pain, diarrhea, nausea and vomiting.  Genitourinary: Negative for enuresis, flank pain and frequency.  Musculoskeletal: Negative for back pain.  Skin: Negative for rash.       Laceration to the left third and fourth digits of his hand.  Neurological: Negative for dizziness and headaches.  Hematological: Does not bruise/bleed easily.    Physical Exam Updated Vital Signs BP (!) 122/98   Pulse 90   Temp 98.8 F (37.1 C) (Oral)   Resp 16   Ht 5\' 8"  (1.727 m)  Wt 102.5 kg   SpO2 98%   BMI 34.36 kg/m   Physical Exam Vitals and nursing note reviewed.  Constitutional:      General: He is not in acute distress.    Appearance: Normal appearance. He is not ill-appearing or diaphoretic.  HENT:     Head: Normocephalic and atraumatic.     Nose: No congestion or rhinorrhea.  Eyes:     General: No scleral icterus.       Right eye: No discharge.        Left eye: No discharge.     Conjunctiva/sclera: Conjunctivae normal.  Pulmonary:     Effort: Pulmonary effort is normal. No respiratory distress.     Breath sounds: Normal breath sounds. No wheezing.  Musculoskeletal:        General: Signs of injury present.     Cervical back: Neck supple.     Right lower leg: No edema.     Left lower leg: No edema.     Comments: Patient's left hand was visualized, there are 2 lacerations noted on the third and fourth digits of the left hand.  Laceration on the third digit ran along the dorsal aspect at the distal end.  It measured about 3 cm in length.  He also noted to have a laceration on the dorall aspect of the fourth digit at the distal end.  He had full range of motion at the proximal, middle, joints.  He was able to slightly flex at the distal joint but cannot fully evaluate as he stated was in a lot of pain and would not move it for me.  Neurovascular is fully intact.  Skin:    General: Skin is warm  and dry.     Coloration: Skin is not jaundiced or pale.  Neurological:     Mental Status: He is alert and oriented to person, place, and time.  Psychiatric:        Mood and Affect: Mood normal.       ED Results / Procedures / Treatments   Labs (all labs ordered are listed, but only abnormal results are displayed) Labs Reviewed - No data to display  EKG None  Radiology DG Hand Complete Left  Result Date: 02/17/2020 CLINICAL DATA:  Laceration third and fourth digits EXAM: LEFT HAND - COMPLETE 3+ VIEW COMPARISON:  None. FINDINGS: Frontal, oblique, and lateral views were obtained. There is soft tissue injury to the volar aspect of the distal portion of the fourth digit without radiopaque foreign body. A lesser degree of soft tissue swelling noted in the volar aspect of the distal aspect of the third digit. No fracture or dislocation. Joint spaces appear normal. No erosive change. IMPRESSION: No fracture or dislocation. No arthropathy. Soft tissue injury to the distal volar aspect of the fourth digit. A lesser degree of soft tissue swelling noted in the distal aspect of the third digit along the volar aspect. Electronically Signed   By: Bretta Bang III M.D.   On: 02/17/2020 14:12    Procedures .Marland KitchenLaceration Repair  Date/Time: 02/17/2020 4:05 PM Performed by: Carroll Sage, PA-C Authorized by: Carroll Sage, PA-C   Consent:    Consent obtained:  Verbal   Consent given by:  Patient   Risks discussed:  Infection, pain, retained foreign body, need for additional repair, poor cosmetic result, tendon damage, vascular damage, poor wound healing and nerve damage   Alternatives discussed:  No treatment Anesthesia (see MAR for exact dosages):  Anesthesia method:  Local infiltration   Local anesthetic:  Lidocaine 1% w/o epi Laceration details:    Location:  Finger   Finger location:  L long finger   Length (cm):  4   Depth (mm):  1 Repair type:    Repair type:   Simple Pre-procedure details:    Preparation:  Patient was prepped and draped in usual sterile fashion and imaging obtained to evaluate for foreign bodies Exploration:    Wound exploration: wound explored through full range of motion and entire depth of wound probed and visualized     Contaminated: no   Treatment:    Area cleansed with:  Saline   Amount of cleaning:  Standard   Irrigation solution:  Sterile saline   Irrigation method:  Syringe   Visualized foreign bodies/material removed: no   Skin repair:    Repair method:  Sutures   Suture size:  4-0   Suture material:  Prolene   Suture technique:  Simple interrupted   Number of sutures:  4 Approximation:    Approximation:  Loose Post-procedure details:    Dressing:  Non-adherent dressing   Patient tolerance of procedure:  Tolerated well, no immediate complications Comments:     After the procedure patient motor, sensation, strength were all intact in the third digit. area was soft to the touch with good capillary refill.  No signs of infection were noted, no rash, no ligament or tendon damage present. Marland Kitchen..Laceration Repair  Date/Time: 02/17/2020 4:06 PM Performed by: Carroll SageFaulkner, Cythnia Osmun J, PA-C Authorized by: Carroll SageFaulkner, Krisna Omar J, PA-C   Consent:    Consent obtained:  Verbal   Consent given by:  Patient   Risks discussed:  Infection, pain, retained foreign body, need for additional repair, poor cosmetic result, tendon damage, vascular damage, poor wound healing and nerve damage   Alternatives discussed:  No treatment Anesthesia (see MAR for exact dosages):    Anesthesia method:  Local infiltration   Local anesthetic:  Lidocaine 1% w/o epi Laceration details:    Location:  Finger   Finger location:  L ring finger   Length (cm):  3   Depth (mm):  1 Repair type:    Repair type:  Simple Pre-procedure details:    Preparation:  Patient was prepped and draped in usual sterile fashion Exploration:    Wound exploration: wound  explored through full range of motion and entire depth of wound probed and visualized     Contaminated: no   Treatment:    Area cleansed with:  Saline   Amount of cleaning:  Standard   Irrigation solution:  Sterile saline   Irrigation method:  Syringe   Visualized foreign bodies/material removed: no   Skin repair:    Repair method:  Sutures   Suture size:  4-0   Suture material:  Prolene   Suture technique:  Simple interrupted   Number of sutures:  3 Approximation:    Approximation:  Loose Post-procedure details:    Dressing:  Non-adherent dressing   Patient tolerance of procedure:  Tolerated well, no immediate complications Comments:     After the procedure patient motor, sensation, strength were all intact fourth digit. area was soft to the touch with good capillary refill.  No signs of infection were noted, no rash, no ligament or tendon damage present.   (including critical care time)  Medications Ordered in ED Medications  lidocaine (PF) (XYLOCAINE) 1 % injection 5 mL (5 mLs Infiltration Given 02/17/20 1441)  Tdap (BOOSTRIX) injection  0.5 mL (0.5 mLs Intramuscular Given 02/17/20 1541)  acetaminophen (TYLENOL) tablet 650 mg (650 mg Oral Given 02/17/20 1541)    ED Course  I have reviewed the triage vital signs and the nursing notes.  Pertinent labs & imaging results that were available during my care of the patient were reviewed by me and considered in my medical decision making (see chart for details).    MDM Rules/Calculators/A&P                          I have personally reviewed all imaging, labs and have interpreted them.  Patient presents with laceration to his third and fourth digit on the left hand.  He was alert, did not appear in acute distress, vital signs reassuring.  Will order x-ray for further evaluation.  X-ray does not show any acute abnormalities.  Due to  decreased range of motion with flexion at the fourth digit at the distal joint   Will contact  hand for further recommendation evaluation.  Spoke with Earney Hamburg, PA-C who communicated with Dr. Janee Morn of hand surgery.  They would like the finger wrapped and his office will contact him to schedule a follow-up appointment.  Patient's finger was sutured closed to help decrease infection risk.  Patient tolerated procedure well.  Neurovascular is fully intact after procedure was performed.  I have low suspicion for fracture, dislocation, foreign body as wound was evaluated no ligament or tendon damage noted, no foreign bodies noted, x-ray does not show any acute abnormalities.  Low suspicion for infection as there was no sign infection on exam, unlikely infection would occur in less than 4 hours.  Low suspicion for tendon or ligament rupture as finger was palpated no gross deformities noted, he had full range of motion at the proximal, middle joints.  He had slight decreased range of motion with flexion at the distal joint on the fourth digit.  Low suspicion for compartment syndrome as neurovascular is fully intact, fingers were soft to the touch.  I suspect decreased flexion at distal joint on fourth digit may be due to pain and inflammation but cannot fully rule out ligament damage.  Will have patient follow-up with hand for further evaluation management.  Presents vitals remained stable, no indication for hospital admission.  Patient was discussed with attending who agrees with assessment and plan.  Patient given at home care and strict return precautions.  Patient verbalized understood and agreed to plan.      Final Clinical Impression(s) / ED Diagnoses Final diagnoses:  Laceration of left middle finger without foreign body without damage to nail, initial encounter  Laceration of left ring finger without foreign body without damage to nail, initial encounter    Rx / DC Orders ED Discharge Orders    None       Carroll Sage, PA-C 02/17/20 1611    Tegeler, Canary Brim,  MD 02/20/20 1350

## 2020-02-18 NOTE — Progress Notes (Signed)
Internal Medicine Clinic Attending ° °Case discussed with Dr. Aslam  At the time of the visit.  We reviewed the resident’s history and exam and pertinent patient test results.  I agree with the assessment, diagnosis, and plan of care documented in the resident’s note.  °

## 2020-02-21 ENCOUNTER — Other Ambulatory Visit: Payer: Self-pay

## 2020-02-21 ENCOUNTER — Other Ambulatory Visit: Payer: Medicare Other

## 2020-02-21 ENCOUNTER — Encounter: Payer: Medicare Other | Admitting: Internal Medicine

## 2020-02-21 DIAGNOSIS — Z0000 Encounter for general adult medical examination without abnormal findings: Secondary | ICD-10-CM

## 2020-02-22 LAB — FECAL OCCULT BLOOD, IMMUNOCHEMICAL: Fecal Occult Bld: NEGATIVE

## 2020-02-24 ENCOUNTER — Encounter: Payer: Medicare Other | Admitting: Internal Medicine

## 2020-03-12 DIAGNOSIS — M5030 Other cervical disc degeneration, unspecified cervical region: Secondary | ICD-10-CM | POA: Diagnosis not present

## 2020-03-12 DIAGNOSIS — M6281 Muscle weakness (generalized): Secondary | ICD-10-CM | POA: Diagnosis not present

## 2020-03-12 DIAGNOSIS — M542 Cervicalgia: Secondary | ICD-10-CM | POA: Diagnosis not present

## 2020-03-17 DIAGNOSIS — S61205A Unspecified open wound of left ring finger without damage to nail, initial encounter: Secondary | ICD-10-CM | POA: Diagnosis not present

## 2020-03-17 DIAGNOSIS — S61203A Unspecified open wound of left middle finger without damage to nail, initial encounter: Secondary | ICD-10-CM | POA: Diagnosis not present

## 2020-03-21 IMAGING — CT CT HEART MORP W/ CTA COR W/ SCORE W/ CA W/CM &/OR W/O CM
4 of 7 series · 8 of 20 positions shown, 9 images · non-contrast
Comparison: None.

CLINICAL DATA: Chest pain

EXAM:
Cardiac CTA
MEDICATIONS:
Sub lingual nitro. 4mg and lopressor 20mg
TECHNIQUE: The patient was scanned on a Siemens [REDACTED]ice scanner. Gantry
rotation speed was 270 msecs. Collimation was .9mm. A 100 kV
prospective scan was triggered in the descending thoracic aorta at
111 HU's with 5% padding centered around 78% of the R-R interval.
Average HR during the scan was 80 bpm. The 3D data set was
interpreted on a dedicated work station using MPR, MIP and VRT
modes. A total of 80cc of contrast was used.

[Series 6: best diast 72 % · axial · 0.39mm/px · z∈[+1281,+1326]mm · 2 of 333 slices shown, 3 images]
[im 111/333  vessel]
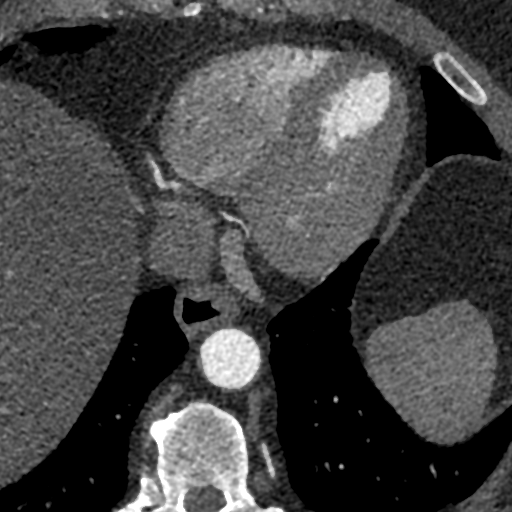
[im 111/333  lung]
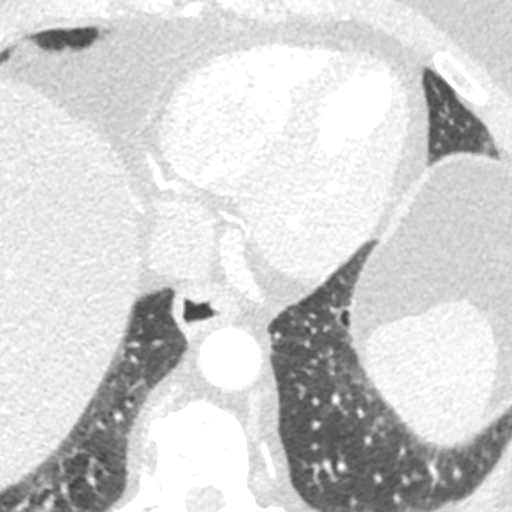
[im 222/333  vessel]
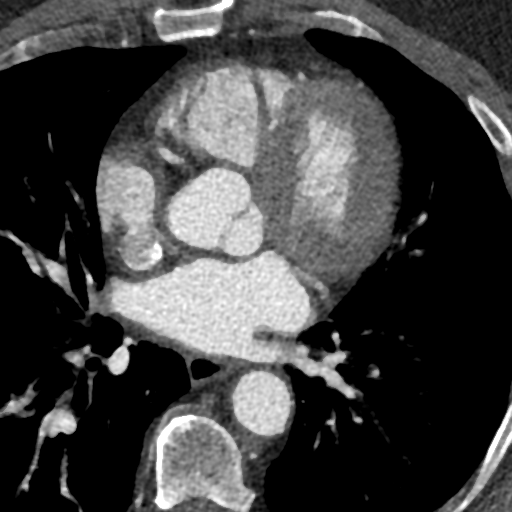

[Series 7: best syst 43 % · axial · 0.39mm/px · z∈[+1281,+1326]mm · 2 of 333 slices shown]
[im 111/333  vessel]
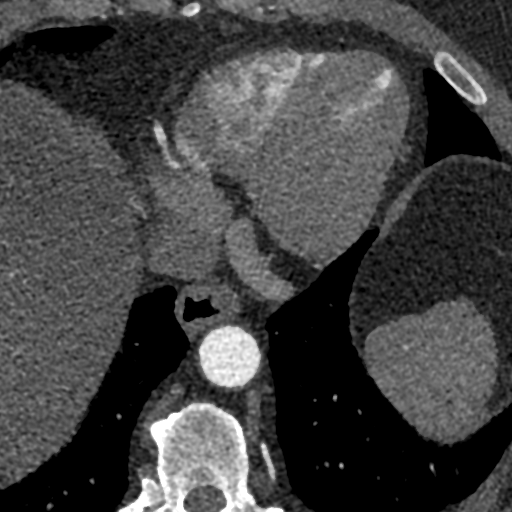
[im 222/333  vessel]
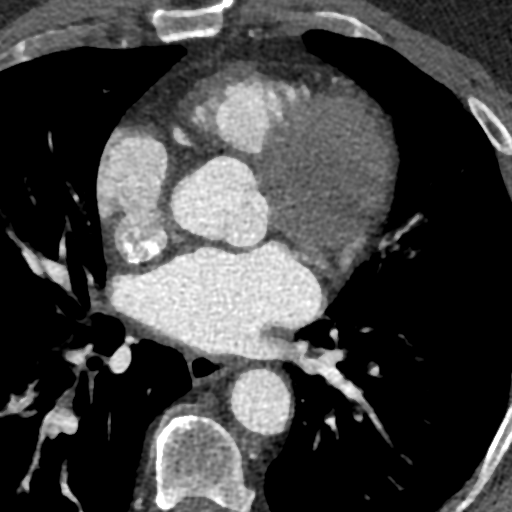

[Series 8: ts diast sharp 72 % · axial · 0.39mm/px · z∈[+1281,+1326]mm · 2 of 333 slices shown]
[im 111/333  lung]
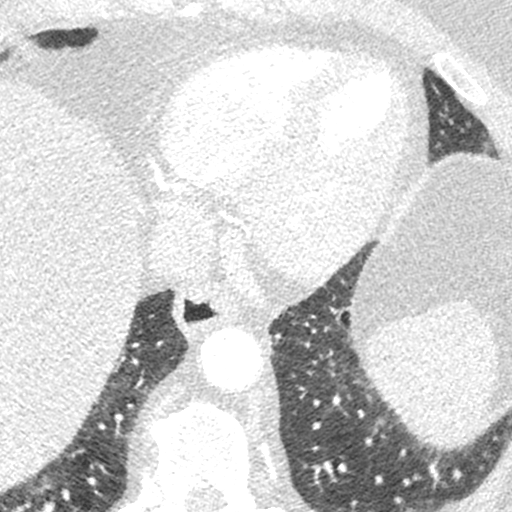
[im 222/333  lung]
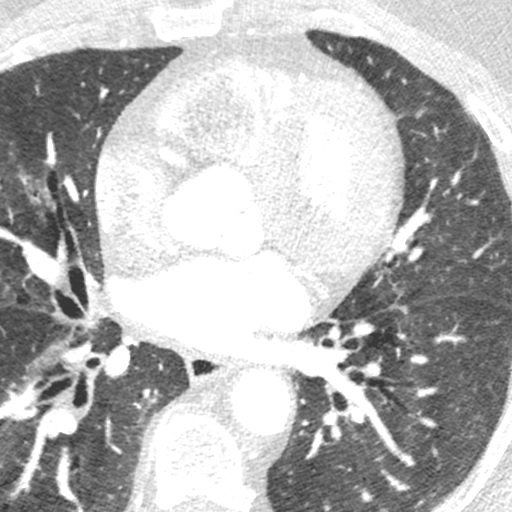

[Series 9: ts syst sharp 43 % · axial · 0.39mm/px · z∈[+1281,+1326]mm · 2 of 333 slices shown]
[im 111/333  lung]
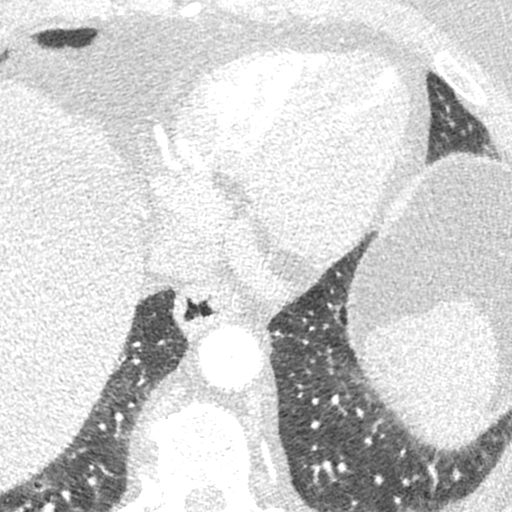
[im 222/333  lung]
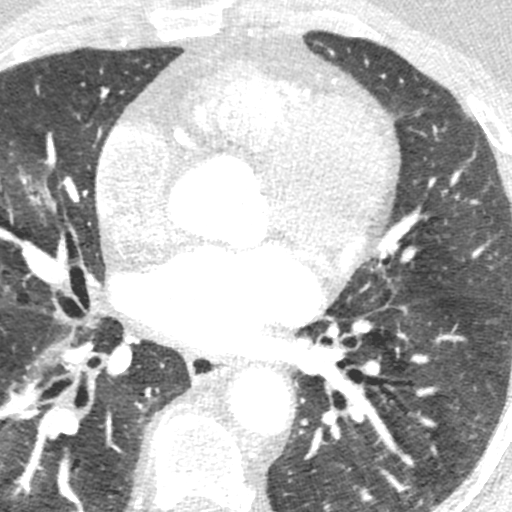

[8 of 20 positions shown; findings below may reference images not displayed]

FINDINGS: Non-cardiac: See separate report from [REDACTED]. No
significant findings on limited lung and soft tissue windows.

Calcium Score: Minidmal punctate calcium noted in proximal LAD and
mid circumflex

Coronary Arteries: Right dominant with no anomalies

LM: Normal

LAD: Less than 30% proximal calcific disease

D1: Normal

D2: Normal

D3: Normal

Circumflex: Less than 30% calcific mid vessel disease

OM1: Normal

OM2: Normal

RCA: Normal

PDA: Normal

PLA: Normal
IMPRESSION: 1.  Calcium score only 5 which is 58 th percentile for age and sex

2. Normal caliber ascending thoracic aorta 3.7 cm with
atherosclerosis

3. Essentially normal right dominant coronary arteries see
description above

Abdelrahman Posadas

EXAM:
OVER-READ INTERPRETATION  CT CHEST

The following report is an over-read performed by radiologist Dr.
Bolivard Bonga [REDACTED] on 09/20/2017. This
over-read does not include interpretation of cardiac or coronary
anatomy or pathology. The coronary calcium score/coronary CTA
interpretation by the cardiologist is attached.
FINDINGS: Aortic atherosclerosis. Within the visualized portions of the thorax
there are no suspicious appearing pulmonary nodules or masses, there
is no acute consolidative airspace disease, no pleural effusions, no
pneumothorax and no lymphadenopathy. Small hiatal hernia. Visualized
portions of the upper abdomen demonstrates diffuse low attenuation
throughout the visualized portions of the hepatic parenchyma,
indicative of severe hepatic steatosis. There are no aggressive
appearing lytic or blastic lesions noted in the visualized portions
of the skeleton.
IMPRESSION: 1.  Aortic Atherosclerosis ([id number]).
2. Severe hepatic steatosis.
3. Small hiatal hernia.

## 2020-03-23 DIAGNOSIS — M542 Cervicalgia: Secondary | ICD-10-CM | POA: Diagnosis not present

## 2020-03-23 DIAGNOSIS — M5030 Other cervical disc degeneration, unspecified cervical region: Secondary | ICD-10-CM | POA: Diagnosis not present

## 2020-03-23 DIAGNOSIS — M6281 Muscle weakness (generalized): Secondary | ICD-10-CM | POA: Diagnosis not present

## 2020-03-30 DIAGNOSIS — M5412 Radiculopathy, cervical region: Secondary | ICD-10-CM | POA: Diagnosis not present

## 2020-04-01 DIAGNOSIS — M5030 Other cervical disc degeneration, unspecified cervical region: Secondary | ICD-10-CM | POA: Diagnosis not present

## 2020-04-01 DIAGNOSIS — M542 Cervicalgia: Secondary | ICD-10-CM | POA: Diagnosis not present

## 2020-04-01 DIAGNOSIS — M6281 Muscle weakness (generalized): Secondary | ICD-10-CM | POA: Diagnosis not present

## 2020-04-06 DIAGNOSIS — M6281 Muscle weakness (generalized): Secondary | ICD-10-CM | POA: Diagnosis not present

## 2020-04-06 DIAGNOSIS — M542 Cervicalgia: Secondary | ICD-10-CM | POA: Diagnosis not present

## 2020-04-06 DIAGNOSIS — M5030 Other cervical disc degeneration, unspecified cervical region: Secondary | ICD-10-CM | POA: Diagnosis not present

## 2020-04-07 ENCOUNTER — Telehealth: Payer: Self-pay | Admitting: *Deleted

## 2020-04-07 ENCOUNTER — Other Ambulatory Visit: Payer: Self-pay | Admitting: *Deleted

## 2020-04-07 DIAGNOSIS — R0789 Other chest pain: Secondary | ICD-10-CM

## 2020-04-07 DIAGNOSIS — M10042 Idiopathic gout, left hand: Secondary | ICD-10-CM

## 2020-04-07 DIAGNOSIS — E119 Type 2 diabetes mellitus without complications: Secondary | ICD-10-CM

## 2020-04-07 MED ORDER — NITROGLYCERIN 0.4 MG SL SUBL: SUBLINGUAL_TABLET | SUBLINGUAL | 1 refills | Status: AC

## 2020-04-07 MED ORDER — COLCHICINE 0.6 MG PO TABS: ORAL_TABLET | ORAL | 3 refills | Status: DC

## 2020-04-07 MED ORDER — VICTOZA 18 MG/3ML ~~LOC~~ SOPN: PEN_INJECTOR | SUBCUTANEOUS | 1 refills | Status: DC

## 2020-04-07 NOTE — Telephone Encounter (Signed)
Patient has history of ED, do not see documentation of medications for this. Do we know if he takes any phosphodiesterase inhibitors (viargra, etc)? Thank you!

## 2020-04-07 NOTE — Telephone Encounter (Signed)
Pt calls and states he has been having chest pain and shortness of breath daily for several weeks, happens at rest and during activity, nothing seems to increase or decrease it, sometimes he cannot do anything for hours because of it. It causes him to be weak. He ias advised to call 911 now and come to ED for eval. HE REFUSES MULTIPLE TIMES. He states he will go to ED tomorrow and get checked out, doesn't have time today. HE IS GREATLY ENCOURAGED MULTIPLE TIMES TO CALL 911.  He also ask for refills and states I really just need my medicine. He is once again ask to call 911 and refuses today

## 2020-04-07 NOTE — Telephone Encounter (Signed)
Agree with your plan to have him call 911 or go to closest ED, his symptoms are concerning. Attempted to call patient however he did not pickup. Will refill medications

## 2020-04-08 NOTE — Telephone Encounter (Signed)
attempted to call pt after conversation 11/30, vmail full

## 2020-04-20 ENCOUNTER — Telehealth: Payer: Self-pay | Admitting: *Deleted

## 2020-04-20 DIAGNOSIS — M542 Cervicalgia: Secondary | ICD-10-CM | POA: Diagnosis not present

## 2020-04-20 DIAGNOSIS — M6281 Muscle weakness (generalized): Secondary | ICD-10-CM | POA: Diagnosis not present

## 2020-04-20 DIAGNOSIS — M5030 Other cervical disc degeneration, unspecified cervical region: Secondary | ICD-10-CM | POA: Diagnosis not present

## 2020-04-20 NOTE — Telephone Encounter (Signed)
Heyun at [facility] notified of info below. Kinnie Feil, BSN, RN-BC

## 2020-04-20 NOTE — Telephone Encounter (Signed)
Daniel Perkins with [facility] called wanting to make Provider aware that there is an interaction between carvedilol and colchicine. Is it ok to fill colchicine?

## 2020-04-20 NOTE — Telephone Encounter (Signed)
Yes. It appears he has been on it for several years now without any major adverse effect.

## 2020-04-27 DIAGNOSIS — M6281 Muscle weakness (generalized): Secondary | ICD-10-CM | POA: Diagnosis not present

## 2020-04-27 DIAGNOSIS — M542 Cervicalgia: Secondary | ICD-10-CM | POA: Diagnosis not present

## 2020-04-27 DIAGNOSIS — M5030 Other cervical disc degeneration, unspecified cervical region: Secondary | ICD-10-CM | POA: Diagnosis not present

## 2020-05-14 ENCOUNTER — Encounter: Payer: Medicare Other | Admitting: Student

## 2020-05-18 DIAGNOSIS — M5030 Other cervical disc degeneration, unspecified cervical region: Secondary | ICD-10-CM | POA: Diagnosis not present

## 2020-05-18 DIAGNOSIS — M542 Cervicalgia: Secondary | ICD-10-CM | POA: Diagnosis not present

## 2020-05-18 DIAGNOSIS — M6281 Muscle weakness (generalized): Secondary | ICD-10-CM | POA: Diagnosis not present

## 2020-05-19 ENCOUNTER — Ambulatory Visit (INDEPENDENT_AMBULATORY_CARE_PROVIDER_SITE_OTHER): Payer: Medicare Other | Admitting: Student

## 2020-05-19 ENCOUNTER — Other Ambulatory Visit: Payer: Self-pay

## 2020-05-19 VITALS — BP 130/81 | HR 92 | Temp 98.0°F | Ht 68.0 in | Wt 228.3 lb

## 2020-05-19 DIAGNOSIS — M545 Low back pain, unspecified: Secondary | ICD-10-CM | POA: Diagnosis not present

## 2020-05-19 DIAGNOSIS — IMO0002 Reserved for concepts with insufficient information to code with codable children: Secondary | ICD-10-CM

## 2020-05-19 DIAGNOSIS — E119 Type 2 diabetes mellitus without complications: Secondary | ICD-10-CM

## 2020-05-19 DIAGNOSIS — M10042 Idiopathic gout, left hand: Secondary | ICD-10-CM | POA: Diagnosis not present

## 2020-05-19 DIAGNOSIS — I10 Essential (primary) hypertension: Secondary | ICD-10-CM

## 2020-05-19 DIAGNOSIS — E118 Type 2 diabetes mellitus with unspecified complications: Secondary | ICD-10-CM

## 2020-05-19 DIAGNOSIS — M25551 Pain in right hip: Secondary | ICD-10-CM

## 2020-05-19 DIAGNOSIS — E1165 Type 2 diabetes mellitus with hyperglycemia: Secondary | ICD-10-CM | POA: Diagnosis not present

## 2020-05-19 LAB — POCT GLYCOSYLATED HEMOGLOBIN (HGB A1C): Hemoglobin A1C: 7.7 % — AB (ref 4.0–5.6)

## 2020-05-19 LAB — GLUCOSE, CAPILLARY: Glucose-Capillary: 141 mg/dL — ABNORMAL HIGH (ref 70–99)

## 2020-05-19 MED ORDER — CARVEDILOL 12.5 MG PO TABS: 12.5000 mg | ORAL_TABLET | Freq: Two times a day (BID) | ORAL | 1 refills | Status: DC

## 2020-05-19 MED ORDER — ALLOPURINOL 100 MG PO TABS: 100.0000 mg | ORAL_TABLET | Freq: Every day | ORAL | 2 refills | Status: DC

## 2020-05-19 MED ORDER — CYCLOBENZAPRINE HCL 7.5 MG PO TABS: 7.5000 mg | ORAL_TABLET | Freq: Three times a day (TID) | ORAL | 0 refills | Status: DC | PRN

## 2020-05-19 MED ORDER — AMLODIPINE BESYLATE 10 MG PO TABS: 10.0000 mg | ORAL_TABLET | Freq: Every day | ORAL | 1 refills | Status: DC

## 2020-05-19 NOTE — Patient Instructions (Addendum)
Thank you, [name] for allowing [place] to provide your care today. Today we discussed your back pain, diabetes and cholesterol.  Based on my exam, your right hip seems unstable so I am planning to evaluate this with a x-ray.  I am also prescribing you some muscle relaxer to help with the pain.  I have ordered the following labs for you:  Lab Orders     Glucose, capillary     POC Hbg A1C   I will call if any are abnormal. All of your labs can be accessed through "My Chart".  I have ordered the following tests: Right hip x-ray, low back x-ray  I have ordered the following medication/changed the following medications:  1. Flexeril 7.5 mg 3 times daily as needed for muscle spasm  Please follow-up in 1 week  Should you have any questions or concerns please call the internal medicine clinic at [phone].    Sharrell Ku, MD, MPH [facility]   My Chart Access: [email or web address]   If you have not already done so, please get your COVID 19 vaccine  To schedule an appointment for a COVID vaccine choice any of the following: Go to [email or web address]   Go to [email or web address]                  Call [phone]                                     Call [phone] and select Option 2

## 2020-05-19 NOTE — Progress Notes (Signed)
   CC: Back pain/Refills  HPI:  [name] is a 65 y.o. male past medical history as below who presents to clinic with a complaint of low back pain. Please see problem based charting for evaluation, assessment and plan.  Past Medical History:  Diagnosis Date  . Club foot of both lower extremities 1958  . Coronary artery disease    Holter monitor 02/2012 - sinus with rare PVC  . CTEV (congenital talipes equinovarus)   . Gout   . Hyperlipidemia   . Hypertension   . Myocardial infarction [facility]) ~ 2007   "mild"  . Osteomyelitis (HCC)   . Pneumonia 2000's   "couple times"  . Type II diabetes mellitus (HCC) 2011    Review of Systems:  Constitutional: Negative for fever Respiratory: Negative for shortness of breath Cardiac: Negative for chest pain MSK: Positive for back and hip pain.  Abdomen: Negative for abdominal pain, constipation or diarrhea Neuro: Negative for numbness, tingling or weakness.  Physical Exam: General: Pleasant elderly man sitting in chair.  No acute distress. Cardiac: RRR. No murmurs, rubs or gallops.  No LE edema Respiratory: Lungs CTAB. No wheezing or crackles. Abdominal: Soft, symmetric and non tender. Normal BS. Skin: Warm, dry. No rashes MSK: No tenderness to palpation of the L-spine. Mild ttp of the right lower paraspinal muscles. Positive pendulum test with pain on internal rotation of the hip. Negative Trendelenburg sign.   Extremities: Atraumatic. Full ROM. Pulse palpable. Neuro: A&O x 3. Moves all extremities. 5/5 strength in the lower extremities. Normal sensation. Limping gait. Psych: Appropriate mood and affect.  Vitals:   05/19/20 1613  BP: 130/81  Pulse: 92  Temp: 98 F (36.7 C)  TempSrc: Oral  SpO2: 98%  Weight: 228 lb 4.8 oz (103.6 kg)  Height: 5\' 8"  (1.727 m)    Assessment & Plan:   See Encounters Tab for problem based charting.  Patient discussed with Dr. , MD, MPH

## 2020-05-19 NOTE — Assessment & Plan Note (Signed)
Patient's BP is better controlled.  BP today is 130/81.  We will continue to monitor closely. -- Continue amlodipine 10 mg daily -- Continue Coreg 12.5 mg twice daily --Continue on Diovan 160 mg daily

## 2020-05-20 ENCOUNTER — Encounter: Payer: Self-pay | Admitting: Student

## 2020-05-20 ENCOUNTER — Telehealth: Payer: Self-pay

## 2020-05-20 DIAGNOSIS — M549 Dorsalgia, unspecified: Secondary | ICD-10-CM | POA: Insufficient documentation

## 2020-05-20 MED ORDER — EMPAGLIFLOZIN 10 MG PO TABS: 10.0000 mg | ORAL_TABLET | Freq: Every day | ORAL | 2 refills | Status: DC

## 2020-05-20 NOTE — Telephone Encounter (Signed)
Pt stated he missed a

## 2020-05-20 NOTE — Assessment & Plan Note (Signed)
Patient's A1c is up to 7.7 today in clinic from 6.8 three months ago.  Patient states he has had trouble using his glucose meter.  States that Lupita Leash gave her the meter 1-2 years ago, and it has not been working the past few months.  Patient denies dizziness, headaches, polyuria or polydipsia.  In the setting of patient's increased A1c, plan to resume Jardiance at this time and assess need for new meter at follow-up visit next week. --Continue glipizide 10 mg twice daily --Continue on Victoza 1.8 mg daily --Restart Jardiance 10 mg daily --Check meter with help from Lupita Leash in 1 week follow-up

## 2020-05-20 NOTE — Assessment & Plan Note (Signed)
Patient states he takes allopurinol daily for his gout and is out of refills. --Refilled allopurinol 100 mg daily -- Continue colchicine 0.6 mg as needed for gout flare ups

## 2020-05-20 NOTE — Assessment & Plan Note (Addendum)
Patient states in the middle of August 2021, he slipped on a grape while at [employer]. He was able to brace himself with a cart but his right knee jammed on the floor. He did not experience any pain at the moment but starting limping out of the store. A month later he started having occasional pains in his right back. He describes the pain as "funny feeling" and squeezing pain that comes and goes. States he was able to deal with this pain for a while but now it has been waking him up at night. Patient also reports that sometimes his right leg gives out when he is walking.  He has been walking with a little limp since the fall. Patient denies any new trauma, weakness, numbness, tingling, knee pain or leg pain.  On exam, patient has no tenderness to palpation of the L-spine but has mild tenderness on the right lower paraspinal muscles. Trendelenburg test is negative. Pendulum test is positive as patient endorses pain with internal rotation of the hip. Based on exam findings, patient likely has hip instability or pathology secondary to his fall a few months ago. Will trial muscle relaxer for the pain and get imaging to further evaluate his pain. --Start Flexeril 7.5 mg 3 times daily as needed for muscle spasms --Follow-up right hip/pelvis x-ray --Follow-up x-ray of the L-spine --1 week follow-up for re-evaluation of patient's pain

## 2020-05-26 NOTE — Progress Notes (Signed)
Internal Medicine Clinic Attending  Case discussed with Dr. Amponsah  At the time of the visit.  We reviewed the resident's history and exam and pertinent patient test results.  I agree with the assessment, diagnosis, and plan of care documented in the resident's note.  

## 2020-05-27 ENCOUNTER — Encounter: Payer: Medicare Other | Admitting: Student

## 2020-05-29 ENCOUNTER — Telehealth: Payer: Self-pay | Admitting: Dietician

## 2020-05-29 NOTE — Telephone Encounter (Signed)
Left voicemail for return call. Per Dr. Kirke Corin at this patient's last visit: Patient is having problems using meter. Consider CGM.

## 2020-06-01 ENCOUNTER — Ambulatory Visit (HOSPITAL_COMMUNITY)
Admission: RE | Admit: 2020-06-01 | Discharge: 2020-06-01 | Disposition: A | Discharge status: Home / Self Care | Payer: Medicare Other | Source: Ambulatory Visit | Attending: Internal Medicine | Admitting: Internal Medicine

## 2020-06-01 ENCOUNTER — Other Ambulatory Visit: Payer: Self-pay

## 2020-06-01 DIAGNOSIS — M6281 Muscle weakness (generalized): Secondary | ICD-10-CM | POA: Diagnosis not present

## 2020-06-01 DIAGNOSIS — M542 Cervicalgia: Secondary | ICD-10-CM | POA: Diagnosis not present

## 2020-06-01 DIAGNOSIS — M545 Low back pain, unspecified: Secondary | ICD-10-CM | POA: Insufficient documentation

## 2020-06-01 DIAGNOSIS — M5030 Other cervical disc degeneration, unspecified cervical region: Secondary | ICD-10-CM | POA: Diagnosis not present

## 2020-06-01 DIAGNOSIS — M25551 Pain in right hip: Secondary | ICD-10-CM | POA: Diagnosis not present

## 2020-06-08 ENCOUNTER — Encounter: Payer: Self-pay | Admitting: Dietician

## 2020-06-08 DIAGNOSIS — M542 Cervicalgia: Secondary | ICD-10-CM | POA: Diagnosis not present

## 2020-06-08 DIAGNOSIS — M6281 Muscle weakness (generalized): Secondary | ICD-10-CM | POA: Diagnosis not present

## 2020-06-08 DIAGNOSIS — M5030 Other cervical disc degeneration, unspecified cervical region: Secondary | ICD-10-CM | POA: Diagnosis not present

## 2020-06-09 ENCOUNTER — Other Ambulatory Visit: Payer: Self-pay | Admitting: Student

## 2020-06-09 ENCOUNTER — Encounter: Payer: Self-pay | Admitting: Student

## 2020-06-09 ENCOUNTER — Ambulatory Visit (INDEPENDENT_AMBULATORY_CARE_PROVIDER_SITE_OTHER): Payer: Medicare Other | Admitting: Student

## 2020-06-09 ENCOUNTER — Other Ambulatory Visit: Payer: Self-pay

## 2020-06-09 VITALS — BP 116/75 | HR 99 | Temp 98.2°F | Ht 67.0 in | Wt 226.8 lb

## 2020-06-09 DIAGNOSIS — E119 Type 2 diabetes mellitus without complications: Secondary | ICD-10-CM

## 2020-06-09 DIAGNOSIS — M545 Low back pain, unspecified: Secondary | ICD-10-CM | POA: Diagnosis not present

## 2020-06-09 DIAGNOSIS — N400 Enlarged prostate without lower urinary tract symptoms: Secondary | ICD-10-CM

## 2020-06-09 DIAGNOSIS — I10 Essential (primary) hypertension: Secondary | ICD-10-CM

## 2020-06-09 DIAGNOSIS — IMO0002 Reserved for concepts with insufficient information to code with codable children: Secondary | ICD-10-CM

## 2020-06-09 DIAGNOSIS — E118 Type 2 diabetes mellitus with unspecified complications: Secondary | ICD-10-CM | POA: Diagnosis not present

## 2020-06-09 DIAGNOSIS — E1165 Type 2 diabetes mellitus with hyperglycemia: Secondary | ICD-10-CM

## 2020-06-09 DIAGNOSIS — F528 Other sexual dysfunction not due to a substance or known physiological condition: Secondary | ICD-10-CM

## 2020-06-09 NOTE — Patient Instructions (Addendum)
Mr. Liew,  It was a pleasure meeting you in clinic today.  For your high blood pressure: Your blood pressure was within normal limits today on amlodipine 10mg  alone. We may be able to hold off on refilling your valsartan and carvedilol for the time being. We will obtain some labs today in clinic to determine whether this is the most appropriate regimen for you. We recommend that you continue to monitor your blood pressure at home with a blood pressure cuff.  For your diabetes: We have placed a CGM (Continuous Glucose Monitor) on your arm to assess your blood glucose readings throughout the day. We would like to re-evaluate the readings in two weeks when you return to clinic. In the meantime, continue taking the glipizide 10mg  twice daily, jardiance 10mg  daily, and Victoza 1.8mg  daily. I have also placed a referral for you to complete an eye exam with opthalmology.  For your low back pain: Continue to take acetaminophen (Tylenol) and NSAIDs (like Ibuprofen) as needed for the pain. I have placed a referral for you to work with physical therapy which may help with improving your symptoms.  For your frequent nocturnal urination: I strongly encourage you to continue following with urology. You previously were seeing Dr. at [facility] [phone]). You can call to schedule a follow-up appointment.  Sincerely, Dr. Alvester Morin, MD

## 2020-06-09 NOTE — Progress Notes (Signed)
   CC: Follow-up T2DM, back pain, HTN, BPH, ED, and health maintenance  HPI:  Mr.Khalin Rahmani is a 65 y.o. with past medical history significant for HTN, T2DM, HLD and gout who presents to clinic for follow-up. Please refer to problem list for charting of this encounter.  Past Medical History:  Diagnosis Date  . Club foot of both lower extremities 1958  . Coronary artery disease    Holter monitor 02/2012 - sinus with rare PVC  . CTEV (congenital talipes equinovarus)   . Gout   . Hyperlipidemia   . Hypertension   . Myocardial infarction [facility]) ~ 2007   "mild"  . Osteomyelitis (HCC)   . Pneumonia 2000's   "couple times"  . Type II diabetes mellitus (HCC) 2011   Review of Systems:  Endorses back pain, inability to maintain erection, frequent nocturnal urination, interrupted urinary stream, weak urinary stream. Denies pain with urination, constipation, chest pain, headaches, lightheadedness, dizziness.  Physical Exam:  Vitals:   06/09/20 1316 06/09/20 1319  BP: (!) 148/86 116/75  Pulse: 90 99  Temp: 98.2 F (36.8 C)   TempSrc: Oral   SpO2: 97%   Weight: 226 lb 12.8 oz (102.9 kg)   Height: 5\' 7"  (1.702 m)    Physical Exam Vitals reviewed.  Constitutional:      General: He is not in acute distress.    Appearance: He is obese. He is not ill-appearing.  Cardiovascular:     Rate and Rhythm: Normal rate and regular rhythm.     Pulses: Normal pulses.     Heart sounds: Normal heart sounds.  Pulmonary:     Effort: Pulmonary effort is normal. No respiratory distress.     Breath sounds: Normal breath sounds.  Abdominal:     General: Abdomen is flat. Bowel sounds are normal.     Palpations: Abdomen is soft.     Tenderness: There is no abdominal tenderness.  Musculoskeletal:     Comments: Mild tenderness to palpation of the right, lower paraspinal muscles. Internal rotation of the hip mildly limited due to pain.  Neurological:     General: No focal deficit present.      Mental Status: He is alert. Mental status is at baseline.  Psychiatric:        Mood and Affect: Mood normal.        Behavior: Behavior normal.    Assessment & Plan:   See Encounters Tab for problem based charting.  Patient discussed with Dr. 

## 2020-06-09 NOTE — Assessment & Plan Note (Signed)
Patient continues to endorse frequent nocturnal urination, weak stream and interruptions while urinating. He previously followed with urology for these complaints and was prescribed tamsulosin 0.4mg  daily and Mybetriq 25mg  daily. For the past several months he has only been taking tamsulosin 0.4mg  daily without relief of his symptoms. Patient desired to follow-up with urology for continued evaluation and management of this condition. -Provided patient phone number to schedule follow-up appointment with Dr. at [facility] [phone]) -Continue tamsulosin 0.4mg  daily

## 2020-06-09 NOTE — Assessment & Plan Note (Signed)
Patient continues to endorse ongoing, right lower back pain. He states that the pain is episodic, relieved by acetaminophen, worsened by movement, and nonradiating. We discussed his recent radiographs of the hips and lumbar spine which revealed moderate degenerative changes of the right hip, minimal degenerative changes of the left hip and spondylosis. Discussed with patient that he may benefit from continued conservative management for his pain and may benefit from physical therapy evaluation. -Continue acetaminophen/NSAIDs PRN -Referral to physical therapy

## 2020-06-09 NOTE — Assessment & Plan Note (Signed)
Patient's blood pressure elevated to 148/86 initially and 116/75 on repeat. Patient reports that he has only been taking amlodipine 10mg  daily for the past several weeks/months. He states that he has run out of carvedilol and valsartan and has not had refills on these prescriptions because of the cost. Due to patient's appropriate control of his hypertension on amlodipine alone, will hold off on refilling carvedilol and valsartan at this time. Of note, patient has not had urine microalbumin/creatinine ratio since 2016. If patient shows findings of nephropathy secondary to his hypertension and diabetes (microalbumin/creatinine greater than 30) he may benefit from substituting the amlodipine for valsartan. -Urine microalbumin/creatinine ratio today -BMP today -Continue amlodipine 10mg  daily -Discontinue valsartan 160mg  daily -Discontinue carvedilol 12.5mg  twice daily

## 2020-06-09 NOTE — Assessment & Plan Note (Signed)
Patient continues to endorse inability to maintain an erection. Per urology recommendations, patient is unable to take PDE5 inhibitor as he takes nitroglycerin PRN. Patient was previously offered extracorporeal inkections, however he reports that this was too expensive for him. Patient desires to discuss this condition further with urology to determine if there may be a cheaper alternative to extracorporeal injections. -Advised patient to follow-up with Dr. Alvester Morin with [facility] [phone])

## 2020-06-09 NOTE — Assessment & Plan Note (Signed)
Patient's last HbA1c 7.7 elevated from 6.8 previously. Since his last visit, he has not checked his blood glucose at home. He reports that he is appropriately adherent to glipizide 10mg  twice daily, victoza 1.8mg  daily, and jardiance 10mg  daily without complication. Per discussion with , patient desired to have CGM placed for glucose monitoring. -CGM placed, follow-up in 1-2 weeks -Continue Victoza 1.8mg  daily -Continue jardiance 10mg  daily -Continue glipizide 10mg  twice daily

## 2020-06-10 LAB — BMP8+ANION GAP
Anion Gap: 13 mmol/L (ref 10.0–18.0)
BUN/Creatinine Ratio: 17 (ref 10–24)
BUN: 18 mg/dL (ref 8–27)
CO2: 22 mmol/L (ref 20–29)
Calcium: 9.3 mg/dL (ref 8.6–10.2)
Chloride: 103 mmol/L (ref 96–106)
Creatinine, Ser: 1.06 mg/dL (ref 0.76–1.27)
GFR calc Af Amer: 86 mL/min/{1.73_m2} (ref >59)
GFR calc non Af Amer: 74 mL/min/{1.73_m2} (ref >59)
Glucose: 116 mg/dL — ABNORMAL HIGH (ref 65–99)
Potassium: 4.2 mmol/L (ref 3.5–5.2)
Sodium: 138 mmol/L (ref 134–144)

## 2020-06-10 LAB — MICROALBUMIN / CREATININE URINE RATIO
Creatinine, Urine: 57.9 mg/dL
Microalb/Creat Ratio: <5 mg/g creat (ref 0–29)
Microalbumin, Urine: <3 ug/mL

## 2020-06-10 NOTE — Progress Notes (Signed)
Internal Medicine Clinic Attending  Case discussed with Dr. Johnson  At the time of the visit.  We reviewed the resident's history and exam and pertinent patient test results.  I agree with the assessment, diagnosis, and plan of care documented in the resident's note.  

## 2020-06-10 NOTE — Progress Notes (Signed)
Documentation for Freestyle Libre Pro Continuous glucose monitoring per discussion with Dr. Laural Benes. Patient brought his meter today, but has not checked his blood sugar because he could not remember how to use the meter or the lancing device.  Freestyle Libre Pro CGM sensor placed today after explained to patient and he agred. Patient was educated about wearing sensor, keeping food, activity and medication log and when to call office. Patient was educated about how to care for the sensor and not to have an MRI, CT or Diathermy while wearing the sensor. Follow up was arranged with the patient for 2 weeks per Dr. Laural Benes.   Lot #: [id number] Serial #: [id number] Expiration Date: 09/05/2020  Norm Parcel, RD 06/10/2020 11:52 AM.

## 2020-06-22 ENCOUNTER — Ambulatory Visit (HOSPITAL_COMMUNITY)
Admission: RE | Admit: 2020-06-22 | Discharge: 2020-06-22 | Disposition: A | Discharge status: Home / Self Care | Payer: Medicare Other | Source: Ambulatory Visit | Attending: Internal Medicine | Admitting: Internal Medicine

## 2020-06-22 ENCOUNTER — Ambulatory Visit (INDEPENDENT_AMBULATORY_CARE_PROVIDER_SITE_OTHER): Payer: Medicare Other | Admitting: Internal Medicine

## 2020-06-22 ENCOUNTER — Other Ambulatory Visit: Payer: Self-pay

## 2020-06-22 ENCOUNTER — Encounter: Payer: Self-pay | Admitting: Internal Medicine

## 2020-06-22 DIAGNOSIS — E1165 Type 2 diabetes mellitus with hyperglycemia: Secondary | ICD-10-CM | POA: Diagnosis not present

## 2020-06-22 DIAGNOSIS — E118 Type 2 diabetes mellitus with unspecified complications: Secondary | ICD-10-CM

## 2020-06-22 DIAGNOSIS — M10042 Idiopathic gout, left hand: Secondary | ICD-10-CM | POA: Diagnosis not present

## 2020-06-22 DIAGNOSIS — M79642 Pain in left hand: Secondary | ICD-10-CM

## 2020-06-22 DIAGNOSIS — IMO0002 Reserved for concepts with insufficient information to code with codable children: Secondary | ICD-10-CM

## 2020-06-22 MED ORDER — COLCHICINE 0.6 MG PO TABS: ORAL_TABLET | ORAL | 3 refills | Status: DC

## 2020-06-22 MED ORDER — ALLOPURINOL 100 MG PO TABS: 100.0000 mg | ORAL_TABLET | Freq: Every day | ORAL | 2 refills | Status: DC

## 2020-06-22 NOTE — Progress Notes (Addendum)
Documentation for Freestyle Libre Pro Continuous glucose monitoring Freestyle Libre Pro CGM sensor  Was removed today and downloaded. It only recorded for 4 days with 1 warm up day. A replacement sensor was put on Daniel Perkins left arm today  Patient was educated about wearing sensor, keeping food, activity and medication log and when to call office. Patient was educated about how to care for the sensor and not to have an MRI, CT or Diathermy while wearing the sensor.  Lot #: [id number] Serial #: [id number] Expiration Date: 09/05/20  Daniel Perkins kept food and activity records. We reviewed them together. He was able to see what happened to his blood sugar when he ate certain foods, late night snacks and walked.  Follow up was arranged with the patient for 2 weeks.   [name], RD 06/22/2020 11:32 AM. .

## 2020-06-22 NOTE — Assessment & Plan Note (Signed)
DMII: At visit 2 weeks ago Hgb A1c 7.7  % at the last visit, up from 6.8 previously.  Patient was not checking his blood glucose at home and requested CGM.  Patient comes in today for evaluation and only has 6 days of data due to sensor falling off.  He was in goal 68% of the time.  One episode at night of being low and reports he was asymptomatic.    A/P: Type 2 diabetes, I would set hemoglobin A1c goal between 7 and 8. Given he doesn't monitor his blood glucose, we will continue CGM monitoring.We will need more data to make adjustments to his regimen and new sensor has been placed today.  -Continue glipizide 10 mg twice daily -Continue Jardiance 10 mg daily -Continue Victoza 1.8 mg daily -CGM, follow-up in 2 weeks

## 2020-06-22 NOTE — Progress Notes (Signed)
   CC: Type 2 Diabetes Mellitus, left hand pain , and gout  HPI:Mr.Treon Kehl is a 65 y.o. male who presents for evaluation of type 2 diabetes mellitus, left hand pain, and gout. Please see individual problem based A/P for details.   Past Medical History:  Diagnosis Date  . Club foot of both lower extremities 1958  . Coronary artery disease    Holter monitor 02/2012 - sinus with rare PVC  . CTEV (congenital talipes equinovarus)   . Gout   . Hyperlipidemia   . Hypertension   . Myocardial infarction [facility]) ~ 2007   "mild"  . Osteomyelitis (HCC)   . Pneumonia 2000's   "couple times"  . Type II diabetes mellitus (HCC) 2011   Review of Systems:   Review of Systems  Constitutional: Negative for chills and fever.  Musculoskeletal: Negative for falls and joint pain.     Physical Exam: Vitals:   06/22/20 1028  BP: 129/83  Pulse: 89  Temp: 98.5 F (36.9 C)  TempSrc: Oral  SpO2: 97%  Weight: 224 lb 3.2 oz (101.7 kg)   Physical Exam Constitutional:      General: He is not in acute distress.    Appearance: He is not ill-appearing.  Cardiovascular:     Rate and Rhythm: Normal rate and regular rhythm.  Pulmonary:     Effort: Pulmonary effort is normal.     Breath sounds: No wheezing or rales.  Abdominal:     General: Bowel sounds are normal.     Tenderness: There is no guarding.  Musculoskeletal:        General: Swelling: right hand. Deformity: no displacement      Comments: Left hand is swollen , tender to palpation on dorsal side of hand over metatarsals and palpation of phalanges cause pain to radiate back into hand. No displacement.   Skin:    General: Skin is warm and dry.     Findings: Erythema (left hand) present. No bruising.       Assessment & Plan:   See Encounters Tab for problem based charting.  Patient discussed with Dr. Criselda Peaches

## 2020-06-22 NOTE — Assessment & Plan Note (Signed)
Patient has left hand pain after punching a sheet rock wall.  The hand is noticeably swollen and he is unable to close his fist.    Assessment: Possible nondisplaced fracture of the left hand Plan:  - DG Hand Complete Left; Future

## 2020-06-22 NOTE — Patient Instructions (Signed)
Thank you, Mr.Dong Malloy for allowing [place] to provide your care today. Today we discussed possible fracture of left hand, type 2 diabetes mellitus, and medication refills.    I have ordered the following labs for you:  Lab Orders  No laboratory test(s) ordered today     Tests ordered today:    Referrals ordered today:   Referral Orders  No referral(s) requested today     I have ordered the following medication/changed the following medications:   Stop the following medications: There are no discontinued medications.   Start the following medications: No orders of the defined types were placed in this encounter.    Follow up: Follow up in 2-4 weeks   Remember: I will call with result of xray.   Should you have any questions or concerns please call the internal medicine clinic at [phone].      Thurmon Fair, M.D. [facility]   [facility] at [facility] Physical therapist in [place] Address: [address] [place], [place], [place] [address]  Phone: [phone]

## 2020-06-22 NOTE — Assessment & Plan Note (Signed)
Uric acid 5.8 in 2020.  Reports his most controlled on allopurinol.  Refilled medications today: - allopurinol (ZYLOPRIM) 100 MG tablet; Take 1 tablet (100 mg total) by mouth daily.  Dispense: 90 tablet; Refill: 2 - colchicine 0.6 MG tablet; TAKE 1 TABLET BY MOUTH  DAILY AS NEEDED FOR GOUT  FLARE  Dispense: 30 tablet; Refill: 3

## 2020-06-23 ENCOUNTER — Encounter: Payer: Medicare Other | Admitting: Internal Medicine

## 2020-06-23 ENCOUNTER — Encounter: Payer: Medicare Other | Admitting: Dietician

## 2020-06-23 NOTE — Progress Notes (Signed)
Called and updated patient on xray results. Recommended RICE therapy. He will call the clinic if his hand is not improving in a few days.

## 2020-06-29 DIAGNOSIS — M5030 Other cervical disc degeneration, unspecified cervical region: Secondary | ICD-10-CM | POA: Diagnosis not present

## 2020-06-29 DIAGNOSIS — M6281 Muscle weakness (generalized): Secondary | ICD-10-CM | POA: Diagnosis not present

## 2020-06-29 DIAGNOSIS — M542 Cervicalgia: Secondary | ICD-10-CM | POA: Diagnosis not present

## 2020-07-01 NOTE — Progress Notes (Signed)
Internal Medicine Clinic Attending  Case discussed with Dr. Steen  At the time of the visit.  We reviewed the resident's history and exam and pertinent patient test results.  I agree with the assessment, diagnosis, and plan of care documented in the resident's note.  

## 2020-07-02 ENCOUNTER — Ambulatory Visit: Payer: Medicare Other | Attending: Internal Medicine | Admitting: Physical Therapy

## 2020-07-06 ENCOUNTER — Ambulatory Visit (INDEPENDENT_AMBULATORY_CARE_PROVIDER_SITE_OTHER): Payer: Medicare Other | Admitting: Dietician

## 2020-07-06 ENCOUNTER — Ambulatory Visit (INDEPENDENT_AMBULATORY_CARE_PROVIDER_SITE_OTHER): Payer: Medicare Other | Admitting: Internal Medicine

## 2020-07-06 ENCOUNTER — Encounter: Payer: Self-pay | Admitting: Internal Medicine

## 2020-07-06 ENCOUNTER — Encounter: Payer: Self-pay | Admitting: Dietician

## 2020-07-06 VITALS — BP 157/82 | HR 79 | Temp 98.4°F | Wt 226.6 lb

## 2020-07-06 DIAGNOSIS — Z713 Dietary counseling and surveillance: Secondary | ICD-10-CM

## 2020-07-06 DIAGNOSIS — R0609 Other forms of dyspnea: Secondary | ICD-10-CM

## 2020-07-06 DIAGNOSIS — I10 Essential (primary) hypertension: Secondary | ICD-10-CM | POA: Diagnosis not present

## 2020-07-06 DIAGNOSIS — R0600 Dyspnea, unspecified: Secondary | ICD-10-CM | POA: Diagnosis not present

## 2020-07-06 DIAGNOSIS — Z7984 Long term (current) use of oral hypoglycemic drugs: Secondary | ICD-10-CM | POA: Diagnosis not present

## 2020-07-06 DIAGNOSIS — IMO0002 Reserved for concepts with insufficient information to code with codable children: Secondary | ICD-10-CM

## 2020-07-06 DIAGNOSIS — E1165 Type 2 diabetes mellitus with hyperglycemia: Secondary | ICD-10-CM

## 2020-07-06 DIAGNOSIS — E119 Type 2 diabetes mellitus without complications: Secondary | ICD-10-CM

## 2020-07-06 MED ORDER — VALSARTAN 80 MG PO TABS: 80.0000 mg | ORAL_TABLET | Freq: Every day | ORAL | 2 refills | Status: DC

## 2020-07-06 MED ORDER — VICTOZA 18 MG/3ML ~~LOC~~ SOPN: PEN_INJECTOR | SUBCUTANEOUS | 1 refills | Status: DC

## 2020-07-06 MED ORDER — EMPAGLIFLOZIN 10 MG PO TABS: 10.0000 mg | ORAL_TABLET | Freq: Every day | ORAL | 2 refills | Status: AC

## 2020-07-06 MED ORDER — GLIPIZIDE 10 MG PO TABS: ORAL_TABLET | ORAL | 0 refills | Status: DC

## 2020-07-06 NOTE — Progress Notes (Signed)
Diabetes Self-Management Education  Visit Type: Follow-up  Appt. Start Time: 1400 Appt. End Time: 1430  07/06/2020  [name], identified by name and date of birth, is a 65 y.o. male with a diagnosis of Diabetes:   Type 2   ASSESSMENT Lab Results  Component Value Date   HGBA1C 7.7 (A) 05/19/2020   HGBA1C 6.8 (A) 02/13/2020   HGBA1C 7.5 10/16/2019   HGBA1C 7.4 (A) 10/11/2019   HGBA1C 6.9 (A) 06/25/2019      Diabetes Self-Management Education - 07/06/20 1600      Visit Information   Visit Type Follow-up      Psychosocial Assessment   Patient Belief/Attitude about Diabetes Motivated to manage diabetes    Self-care barriers Lack of material resources;Unable to determine    Self-management support Doctor's office;Family;CDE visits    Patient Concerns Nutrition/Meal planning;Monitoring    Special Needs None    Preferred Learning Style No preference indicated    Learning Readiness Ready    How often do you need to have someone help you when you read instructions, pamphlets, or other written materials from your doctor or pharmacy? 3 - Sometimes      Pre-Education Assessment   Patient understands monitoring blood glucose, interpreting and using results Needs Review    Patient understands prevention, detection, and treatment of acute complications. Needs Review    Patient understands how to develop strategies to promote health/change behavior. Needs Review      Complications   Last HgB A1C per patient/outside source 7.7 %    How often do you check your blood sugar? 0 times/day (not testing)   has a difficult time using meter   Fasting Blood glucose range (mg/dL) 41-937    Number of hypoglycemic episodes per month 0   patient denies   Have you had a dilated eye exam in the past 12 months? No      Dietary Intake   Beverage(s) juice and regular soda      Patient Education   Monitoring Taught/evaluated SMBG meter.   needs to biuld confidence in self monitoring vs  decrease medicine that can cause hypoglycemia   Acute complications Taught treatment of hypoglycemia - the 15 rule.    Personal strategies to promote health Helped patient develop diabetes management plan for (enter comment)   drinking more low sugar beverages     Individualized Goals (developed by patient)   Nutrition Other (comment)   drink more lower sugar drinks and less juice and soda     Outcomes   Expected Outcomes Demonstrated limited interest in learning.  Expect minimal changes    Future DMSE 3-4 months    Program Status Completed      Subsequent Visit   Since your last visit have you continued or begun to take your medications as prescribed? Yes    Since your last visit have you had your blood pressure checked? Yes    Is your most recent blood pressure lower, unchanged, or higher since your last visit? Higher    Since your last visit have you experienced any weight changes? No change    Since your last visit, are you checking your blood glucose at least once a day? No           Individualized Plan for Diabetes Self-Management Training:   Learning Objective:  Patient will have a greater understanding of diabetes self-management. Patient education plan is to attend individual and/or group sessions per assessed needs and concerns.   Plan:  Patient Instructions  Your appointment with Dr. Dione Booze is March 22 at 945 AM Tuesday Located in: [facility] Address: [address] [place], [place], [place] [address] Phone: [phone]  I will buy Diet GREEN Tea tomorrow. I will start drinking DIET GREEN TEA instead of regular soda and juice  Except for a small glass daily.   Check your blood sugar if you feel cold, clammy, shaky, dizzy or just do or feel right.Lupita Leash [phone]     Expected Outcomes:  Demonstrated limited interest in learning.  Expect minimal changes Education material provided: Diabetes Resources If problems or questions, patient to contact team  via:  Phone Future DSME appointment: 3-4 months   Lupita Leash [phone]

## 2020-07-06 NOTE — Assessment & Plan Note (Addendum)
Daniel Perkins states he continues to have dyspnea on exertion.  It happens throughout the day only when he is exerting himself with rapid onset lasting 10 to 15 seconds.  Alleviation only with rest.  He denies any orthopnea, but notes that he sleeps in a recliner due to his previous C-spine surgeries.   Assessment/plan: Per chart review this has been present since 2013.  Patient had extensive cardiac work-up with no significant findings to explain symptoms.  He also had PFTs in 2019 that showed a mild obstructive pattern (only by loop curve, not by lab values).  Patient did have diffusion defect noted at that time.  On examination today, patient did have bibasilar rales, however I do not feel like this is contributing given that symptoms are only when upright.  He denies any orthopnea.  No signs on examination to suggest heart failure.  Given that that was 3 years ago, will repeat full PFTs and then consider referral to pulmonology again given that he followed up with Dr. Sherene Sires once and did not follow-up afterwards.  Patient also has a sleep study that was ordered and still not yet scheduled.  -Schedule PFTs -Schedule sleep study -Consider referral to pulmonology again for follow-up with Dr. Lossie Faes

## 2020-07-06 NOTE — Progress Notes (Signed)
   CC: T2DM, HTN, DOE  HPI:  Daniel Perkins is a 65 y.o. with a PMHx as listed below who presents to the clinic for T2DM, HTN, DOE.   Please see the Encounters tab for problem-based Assessment & Plan regarding status of patient's acute and chronic conditions.  Past Medical History:  Diagnosis Date  . Club foot of both lower extremities 1958  . Coronary artery disease    Holter monitor 02/2012 - sinus with rare PVC  . CTEV (congenital talipes equinovarus)   . Gout   . Hyperlipidemia   . Hypertension   . Myocardial infarction [facility] ~ 2007   "mild"  . Osteomyelitis (HCC)   . Pneumonia 2000's   "couple times"  . Pneumonia due to COVID-19 virus 04/28/2019  . Type II diabetes mellitus (HCC) 2011   Review of Systems: Review of Systems  Constitutional: Negative for chills and fever.  Respiratory: Positive for shortness of breath (on exertion). Negative for cough and wheezing.   Cardiovascular: Negative for chest pain, orthopnea and leg swelling.  Gastrointestinal: Negative for abdominal pain, diarrhea, nausea and vomiting.  Neurological: Negative for dizziness, tingling and headaches.   Physical Exam:  Vitals:   07/06/20 1357 07/06/20 1435  BP: (!) 154/89 (!) 157/82  Pulse: 85 79  Temp: 98.4 F (36.9 C)   TempSrc: Oral   SpO2: 99%   Weight: 226 lb 9.6 oz (102.8 kg)    Physical Exam Vitals and nursing note reviewed.  Constitutional:      General: He is not in acute distress.    Appearance: He is obese.  Cardiovascular:     Rate and Rhythm: Normal rate and regular rhythm.     Heart sounds: Murmur (systolic, 2/6) heard.    Pulmonary:     Effort: Pulmonary effort is normal. No respiratory distress.     Breath sounds: Rales (minimal bibasilar) present. No wheezing or rhonchi.  Musculoskeletal:     Right lower leg: No edema.     Left lower leg: No edema.  Skin:    General: Skin is warm and dry.  Neurological:     General: No focal deficit present.     Mental  Status: He is alert and oriented to person, place, and time. Mental status is at baseline.  Psychiatric:        Mood and Affect: Mood normal.        Behavior: Behavior normal.     Assessment & Plan:   See Encounters Tab for problem based charting.  Patient discussed with Dr. Heide Spark

## 2020-07-06 NOTE — Assessment & Plan Note (Signed)
Current medication includes Amlodipine only. Patient states he has been taking it daily without any difficulty. He notes that at his last appointment, his Valsartan and Coreg were discontinued, as he was no longer taking it and his BP has improved. He denies any chest pain, headache, dizziness.   Assessment/Plan:  Blood pressure above goal today. Will continue Amlodipine and restart Valsartan.   - Amlodipine 10 mg daily - Start Valsartan 80 mg daily - 6 week follow up

## 2020-07-06 NOTE — Patient Instructions (Addendum)
It was nice seeing you today! Thank you for choosing [facility] for your Primary Care.    Today we talked about:   1. Diabetes: Continue with your current routine of Glipizide, Jardiance and Victoza. Let's hold off on repeating the glucose monitor. Continue to check your sugars at home though.   2. High blood pressure: We have restarted your Valsartan. Please pick this up from the pharmacy. Continue taking Amlodipine daily as well.   3. Trouble breathing: I have re-ordered lung function tests. We'll call you to schedule these. We will check these before sending you to see the lung doctors.

## 2020-07-06 NOTE — Addendum Note (Signed)
Addended by: Neomia Dear on: 07/06/2020 05:42 PM   Modules accepted: Orders

## 2020-07-06 NOTE — Assessment & Plan Note (Signed)
Daniel Perkins presents today for ration of a CGM. Unfortunately, CGM wasn't activated so there is no data to analyze today.  He states he is doing well on his current regimen.  He denies any hypoglycemic events as far as he knows.  Assessment/plan: Patient's diabetes is relatively well controlled and nearly within goal.  He denies any hypoglycemic events and is stable on a non-insulin regimen.  No indication to re-do CGM at this time.  -Continue Jardiance, Victoza, glipizide -A1c due in 6 weeks

## 2020-07-06 NOTE — Patient Instructions (Signed)
Your appointment with Dr. Dione Booze is March 22 at 945 AM Tuesday Located in: [facility] Address: [address] [place], [place], [place] [address] Phone: [phone]  I will buy Diet GREEN Tea tomorrow. I will start drinking DIET GREEN TEA instead of regular soda and juice  Except for a small glass daily.   Check your blood sugar if you feel cold, clammy, shaky, dizzy or just do or feel right.Lupita Leash [phone]

## 2020-07-08 NOTE — Progress Notes (Signed)
Internal Medicine Clinic Attending  Case discussed with Dr. Basaraba  At the time of the visit.  We reviewed the resident's history and exam and pertinent patient test results.  I agree with the assessment, diagnosis, and plan of care documented in the resident's note.  

## 2020-07-20 DIAGNOSIS — M6281 Muscle weakness (generalized): Secondary | ICD-10-CM | POA: Diagnosis not present

## 2020-07-20 DIAGNOSIS — M542 Cervicalgia: Secondary | ICD-10-CM | POA: Diagnosis not present

## 2020-07-20 DIAGNOSIS — M5030 Other cervical disc degeneration, unspecified cervical region: Secondary | ICD-10-CM | POA: Diagnosis not present

## 2020-07-27 DIAGNOSIS — M6281 Muscle weakness (generalized): Secondary | ICD-10-CM | POA: Diagnosis not present

## 2020-07-27 DIAGNOSIS — M542 Cervicalgia: Secondary | ICD-10-CM | POA: Diagnosis not present

## 2020-07-27 DIAGNOSIS — M5030 Other cervical disc degeneration, unspecified cervical region: Secondary | ICD-10-CM | POA: Diagnosis not present

## 2020-07-29 NOTE — Addendum Note (Signed)
Addended by: Neomia Dear on: 07/29/2020 07:34 AM   Modules accepted: Orders

## 2020-08-03 DIAGNOSIS — M5030 Other cervical disc degeneration, unspecified cervical region: Secondary | ICD-10-CM | POA: Diagnosis not present

## 2020-08-03 DIAGNOSIS — M542 Cervicalgia: Secondary | ICD-10-CM | POA: Diagnosis not present

## 2020-08-03 DIAGNOSIS — M6281 Muscle weakness (generalized): Secondary | ICD-10-CM | POA: Diagnosis not present

## 2020-08-10 DIAGNOSIS — M542 Cervicalgia: Secondary | ICD-10-CM | POA: Diagnosis not present

## 2020-08-10 DIAGNOSIS — M6281 Muscle weakness (generalized): Secondary | ICD-10-CM | POA: Diagnosis not present

## 2020-08-10 DIAGNOSIS — M5030 Other cervical disc degeneration, unspecified cervical region: Secondary | ICD-10-CM | POA: Diagnosis not present

## 2020-08-17 ENCOUNTER — Telehealth: Payer: Self-pay | Admitting: *Deleted

## 2020-08-17 ENCOUNTER — Encounter: Payer: Medicare Other | Admitting: Student

## 2020-08-17 NOTE — Telephone Encounter (Signed)
Called patient left voice message with sister for patient to call the clinic to reschedule today's missed appointment.

## 2020-08-27 ENCOUNTER — Telehealth: Payer: Self-pay

## 2020-08-27 NOTE — Telephone Encounter (Signed)
Pt is requesting a call back he stated that his shoulders have been hurting him  Off and on  ..but this time  3 months now

## 2020-08-27 NOTE — Telephone Encounter (Signed)
Return pt's call - person who answered the phone stated he's not at home but will him a message.

## 2020-09-04 ENCOUNTER — Telehealth: Payer: Self-pay

## 2020-09-04 DIAGNOSIS — Z9114 Patient's other noncompliance with medication regimen: Secondary | ICD-10-CM

## 2020-09-04 NOTE — Progress Notes (Addendum)
[facility])                                            [facility] Quality Pharmacy Team                                        Statin Quality Measure Assessment    09/04/2020  Daniel Perkins 1955/08/04 [id number]  Next appointment w/Dr. Gwyneth Revels: 09/09/2020    Per review of chart and payor information, this patient has been flagged for non-adherence to the following CMS Quality Measure:   [x]  Statin Use in Persons with Diabetes  [x]  Statin Use in Persons with Cardiovascular Disease  The ASCVD Risk score DC Jr., et al., 2013) failed to calculate for the following reasons:   The patient has a prior MI or stroke diagnosis10/11/2019  Currently prescribed statin:  [x]  Yes []  No     Comments: rosuvastatin 20 mg QD - last filled 02/2020 for 90 ds; no pharmacy claims since that time. There is a possible concern for medication noncompliance with rosuvastatin based on fill history. I called and left a message w/ patient's sister for him to return my call.  History of statin use:            [x]  Yes []  No   Comments: pravastatin - caused muscle pain and elevated CK levels.   Please consider the following recommendation:   If clinically appropriate, renew rosuvastatin 20 mg QD at the next appointment.   If patient reports statin intolerance, please consider alternating statin frequency to 1-3 times weekly and/or associating G72.0 (for myopathy) or M79.1 (mylagia)   Thank you for your time,  04/14/2020, PharmD Clinical Pharmacist [facility] Cell: [phone]

## 2020-09-09 ENCOUNTER — Encounter: Payer: Medicare Other | Admitting: Student

## 2020-09-17 ENCOUNTER — Telehealth: Payer: Self-pay | Admitting: *Deleted

## 2020-09-17 ENCOUNTER — Encounter: Payer: Medicare Other | Admitting: Student

## 2020-09-17 NOTE — Telephone Encounter (Signed)
Called patient on both phone numbers, no answer. Patient did not keep appointment. Needs to reschedule.

## 2020-10-08 DIAGNOSIS — Z20822 Contact with and (suspected) exposure to covid-19: Secondary | ICD-10-CM | POA: Diagnosis not present

## 2020-10-23 DIAGNOSIS — F17211 Nicotine dependence, cigarettes, in remission: Secondary | ICD-10-CM | POA: Diagnosis not present

## 2020-10-23 DIAGNOSIS — M109 Gout, unspecified: Secondary | ICD-10-CM | POA: Diagnosis not present

## 2020-10-23 DIAGNOSIS — I739 Peripheral vascular disease, unspecified: Secondary | ICD-10-CM | POA: Diagnosis not present

## 2020-10-23 DIAGNOSIS — I110 Hypertensive heart disease with heart failure: Secondary | ICD-10-CM | POA: Diagnosis not present

## 2020-10-23 DIAGNOSIS — E1169 Type 2 diabetes mellitus with other specified complication: Secondary | ICD-10-CM | POA: Diagnosis not present

## 2020-10-23 DIAGNOSIS — I10 Essential (primary) hypertension: Secondary | ICD-10-CM | POA: Diagnosis not present

## 2020-10-23 DIAGNOSIS — I5032 Chronic diastolic (congestive) heart failure: Secondary | ICD-10-CM | POA: Diagnosis not present

## 2020-10-23 DIAGNOSIS — I2510 Atherosclerotic heart disease of native coronary artery without angina pectoris: Secondary | ICD-10-CM | POA: Diagnosis not present

## 2020-10-23 DIAGNOSIS — M47816 Spondylosis without myelopathy or radiculopathy, lumbar region: Secondary | ICD-10-CM | POA: Diagnosis not present

## 2020-11-02 DIAGNOSIS — Z79899 Other long term (current) drug therapy: Secondary | ICD-10-CM | POA: Diagnosis not present

## 2020-11-04 DIAGNOSIS — I110 Hypertensive heart disease with heart failure: Secondary | ICD-10-CM | POA: Diagnosis not present

## 2020-11-04 DIAGNOSIS — I358 Other nonrheumatic aortic valve disorders: Secondary | ICD-10-CM | POA: Diagnosis not present

## 2020-11-04 DIAGNOSIS — I25118 Atherosclerotic heart disease of native coronary artery with other forms of angina pectoris: Secondary | ICD-10-CM | POA: Diagnosis not present

## 2020-11-04 DIAGNOSIS — E785 Hyperlipidemia, unspecified: Secondary | ICD-10-CM | POA: Diagnosis not present

## 2020-11-04 DIAGNOSIS — E1169 Type 2 diabetes mellitus with other specified complication: Secondary | ICD-10-CM | POA: Diagnosis not present

## 2020-11-04 DIAGNOSIS — D649 Anemia, unspecified: Secondary | ICD-10-CM | POA: Diagnosis not present

## 2020-11-04 DIAGNOSIS — Z79899 Other long term (current) drug therapy: Secondary | ICD-10-CM | POA: Diagnosis not present

## 2020-11-04 DIAGNOSIS — M109 Gout, unspecified: Secondary | ICD-10-CM | POA: Diagnosis not present

## 2020-11-04 DIAGNOSIS — Z0001 Encounter for general adult medical examination with abnormal findings: Secondary | ICD-10-CM | POA: Diagnosis not present

## 2020-11-12 ENCOUNTER — Other Ambulatory Visit: Payer: Self-pay | Admitting: Family Medicine

## 2020-11-12 DIAGNOSIS — M25512 Pain in left shoulder: Secondary | ICD-10-CM

## 2020-11-28 NOTE — Telephone Encounter (Signed)
Unable to reach patient.

## 2020-12-04 ENCOUNTER — Encounter: Payer: Self-pay | Admitting: Podiatry

## 2020-12-04 ENCOUNTER — Ambulatory Visit (INDEPENDENT_AMBULATORY_CARE_PROVIDER_SITE_OTHER): Payer: Medicare Other | Admitting: Podiatry

## 2020-12-04 ENCOUNTER — Other Ambulatory Visit: Payer: Self-pay

## 2020-12-04 DIAGNOSIS — M79675 Pain in left toe(s): Secondary | ICD-10-CM | POA: Diagnosis not present

## 2020-12-04 DIAGNOSIS — B351 Tinea unguium: Secondary | ICD-10-CM | POA: Diagnosis not present

## 2020-12-04 DIAGNOSIS — E118 Type 2 diabetes mellitus with unspecified complications: Secondary | ICD-10-CM | POA: Diagnosis not present

## 2020-12-04 DIAGNOSIS — IMO0002 Reserved for concepts with insufficient information to code with codable children: Secondary | ICD-10-CM

## 2020-12-04 DIAGNOSIS — E1165 Type 2 diabetes mellitus with hyperglycemia: Secondary | ICD-10-CM | POA: Diagnosis not present

## 2020-12-04 DIAGNOSIS — M79674 Pain in right toe(s): Secondary | ICD-10-CM

## 2020-12-04 NOTE — Progress Notes (Signed)
  Subjective:  Patient ID: Daniel Perkins, male    DOB: [birth year],  MRN: [id number]  Chief Complaint  Patient presents with   Nail Problem    Foot exam  Nail trim    65 y.o. male returns for the above complaint.  Patient presents with thickened elongated dystrophic toenails x10.  Patient would like to have them debrided down.  Patient is a diabetic.  He is not able to do it himself.  His last A1c was 7.7 he denies any other acute complaints.  Objective:  There were no vitals filed for this visit. Podiatric Exam: Vascular: dorsalis pedis and posterior tibial pulses are palpable bilateral. Capillary return is immediate. Temperature gradient is WNL. Skin turgor WNL  Sensorium: Normal Semmes Weinstein monofilament test. Normal tactile sensation bilaterally. Nail Exam: Pt has thick disfigured discolored nails with subungual debris noted bilateral entire nail hallux through fifth toenails.  Pain on palpation to the nails. Ulcer Exam: There is no evidence of ulcer or pre-ulcerative changes or infection. Orthopedic Exam: Muscle tone and strength are WNL. No limitations in general ROM. No crepitus or effusions noted. HAV  B/L.  Hammer toes 2-5  B/L. Skin: No Porokeratosis. No infection or ulcers    Assessment & Plan:   1. Uncontrolled diabetes mellitus with complications (HCC)   2. Pain due to onychomycosis of toenails of both feet     Patient was evaluated and treated and all questions answered.  Onychomycosis with pain  -Nails palliatively debrided as below. -Educated on self-care  Procedure: Nail Debridement Rationale: pain  Type of Debridement: manual, sharp debridement. Instrumentation: Nail nipper, rotary burr. Number of Nails: 10  Procedures and Treatment: Consent by patient was obtained for treatment procedures. The patient understood the discussion of treatment and procedures well. All questions were answered thoroughly reviewed. Debridement of mycotic and hypertrophic  toenails, 1 through 5 bilateral and clearing of subungual debris. No ulceration, no infection noted.  Return Visit-Office Procedure: Patient instructed to return to the office for a follow up visit 3 months for continued evaluation and treatment.  Nicholes Rough, DPM    No follow-ups on file.

## 2020-12-05 ENCOUNTER — Other Ambulatory Visit: Payer: Medicare Other

## 2020-12-12 ENCOUNTER — Other Ambulatory Visit: Payer: Medicare Other

## 2020-12-18 ENCOUNTER — Other Ambulatory Visit: Payer: Self-pay

## 2020-12-18 ENCOUNTER — Ambulatory Visit (HOSPITAL_COMMUNITY)
Admission: EM | Admit: 2020-12-18 | Discharge: 2020-12-18 | Disposition: A | Discharge status: Home / Self Care | Payer: Medicare Other | Attending: Physician Assistant | Admitting: Physician Assistant

## 2020-12-18 ENCOUNTER — Encounter (HOSPITAL_COMMUNITY): Payer: Self-pay | Admitting: Emergency Medicine

## 2020-12-18 DIAGNOSIS — K0889 Other specified disorders of teeth and supporting structures: Secondary | ICD-10-CM | POA: Diagnosis present

## 2020-12-18 DIAGNOSIS — K047 Periapical abscess without sinus: Secondary | ICD-10-CM

## 2020-12-18 DIAGNOSIS — R5383 Other fatigue: Secondary | ICD-10-CM | POA: Insufficient documentation

## 2020-12-18 DIAGNOSIS — R531 Weakness: Secondary | ICD-10-CM

## 2020-12-18 DIAGNOSIS — R5381 Other malaise: Secondary | ICD-10-CM | POA: Diagnosis present

## 2020-12-18 LAB — CBC WITH DIFFERENTIAL/PLATELET
Abs Immature Granulocytes: 0.05 10*3/uL (ref 0.00–0.07)
Basophils Absolute: 0 10*3/uL (ref 0.0–0.1)
Basophils Relative: 0 %
Eosinophils Absolute: 0.1 10*3/uL (ref 0.0–0.5)
Eosinophils Relative: 1 %
HCT: 44.2 % (ref 39.0–52.0)
Hemoglobin: 13.8 g/dL (ref 13.0–17.0)
Immature Granulocytes: 0 %
Lymphocytes Relative: 12 %
Lymphs Abs: 1.5 10*3/uL (ref 0.7–4.0)
MCH: 22.5 pg — ABNORMAL LOW (ref 26.0–34.0)
MCHC: 31.2 g/dL (ref 30.0–36.0)
MCV: 72.1 fL — ABNORMAL LOW (ref 80.0–100.0)
Monocytes Absolute: 1.6 10*3/uL — ABNORMAL HIGH (ref 0.1–1.0)
Monocytes Relative: 13 %
Neutro Abs: 9.2 10*3/uL — ABNORMAL HIGH (ref 1.7–7.7)
Neutrophils Relative %: 74 %
Platelets: 354 10*3/uL (ref 150–400)
RBC: 6.13 MIL/uL — ABNORMAL HIGH (ref 4.22–5.81)
RDW: 15.7 % — ABNORMAL HIGH (ref 11.5–15.5)
WBC: 12.5 10*3/uL — ABNORMAL HIGH (ref 4.0–10.5)
nRBC: 0 % (ref 0.0–0.2)

## 2020-12-18 LAB — COMPREHENSIVE METABOLIC PANEL
ALT: 26 U/L (ref 0–44)
AST: 29 U/L (ref 15–41)
Albumin: 3.7 g/dL (ref 3.5–5.0)
Alkaline Phosphatase: 107 U/L (ref 38–126)
Anion gap: 9 (ref 5–15)
BUN: 11 mg/dL (ref 8–23)
CO2: 22 mmol/L (ref 22–32)
Calcium: 9.6 mg/dL (ref 8.9–10.3)
Chloride: 104 mmol/L (ref 98–111)
Creatinine, Ser: 0.99 mg/dL (ref 0.61–1.24)
GFR, Estimated: >60 mL/min (ref >60)
Glucose, Bld: 122 mg/dL — ABNORMAL HIGH (ref 70–99)
Potassium: 3.8 mmol/L (ref 3.5–5.1)
Sodium: 135 mmol/L (ref 135–145)
Total Bilirubin: 0.9 mg/dL (ref 0.3–1.2)
Total Protein: 8.3 g/dL — ABNORMAL HIGH (ref 6.5–8.1)

## 2020-12-18 MED ORDER — KETOROLAC TROMETHAMINE 30 MG/ML IJ SOLN
30.0000 mg | Freq: Once | INTRAMUSCULAR | Status: AC
  Administered 2020-12-18: 30 mg via INTRAMUSCULAR

## 2020-12-18 MED ORDER — KETOROLAC TROMETHAMINE 30 MG/ML IJ SOLN
INTRAMUSCULAR | Status: AC
  Filled 2020-12-18: qty 1

## 2020-12-18 MED ORDER — AMOXICILLIN-POT CLAVULANATE 875-125 MG PO TABS: 1.0000 | ORAL_TABLET | Freq: Two times a day (BID) | ORAL | 0 refills | Status: DC

## 2020-12-18 NOTE — ED Triage Notes (Signed)
Dental pain in both upper and lower jaws, poor dentition. Reports needing multiple teeth removed. No obvious facial swelling. Pain worsening over last two days.

## 2020-12-18 NOTE — ED Provider Notes (Signed)
[facility]    CSN: [id number] Arrival date & time: 12/18/20  [id number]      History   Chief Complaint Chief Complaint  Patient presents with   Dental Pain    HPI Daniel Perkins is a 65 y.o. male.   Patient presents today with a several day history of worsening right-sided jaw pain.  He has a history of poor dentition and frequently gets dental infections and states current symptoms are similar to previous episodes of this condition.  Reports pain is rated 10 on a 0-10 pain scale, localized to right upper and lower jaw with radiation toward ear into neck, described as aching, worse with mastication, no alleviating factors identified.  He has tried Tylenol without improvement of symptoms.  Reports he has been eating and drinking normally but does feel generally weak.  He denies any fever, difficulty swallowing, difficulty speaking, changes to voice, swelling of throat/mouth.  He denies any recent antibiotic use.  He has not seen a dentist or had any procedures recently.   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   Hepatic steatosis 05/01/2019   Prostate hypertrophy 12/06/2018   Sleep apnea-like behavior 10/01/2014   Peripheral vascular disease (HCC) 02/10/2014   Microcytic anemia 02/06/2013   CTEV (congenital talipes equinovarus)    Statin intolerance 06/29/2012   Esophageal reflux 06/29/2012    Health care maintenance 06/28/2012   Gout 06/12/2012   Obesity, Class II, BMI 35-39.9 02/20/2012   DOE (dyspnea on exertion) 02/20/2012   Uncontrolled diabetes mellitus with complications (HCC) 09/06/2009   ERECTILE DYSFUNCTION, NON-ORGANIC 02/16/2009   Hyperlipemia 01/14/2009   HYPERTENSION 01/14/2009    Past Surgical History:  Procedure Laterality Date   ANTERIOR CERVICAL DECOMP/DISCECTOMY FUSION  X 2   CARDIAC CATHETERIZATION     FOOT FRACTURE SURGERY Right 1990's   "pt a steel plate in"   FOOT SURGERY Left x16   "joints collapsed; keep getting infected"   POSTERIOR CERVICAL FUSION/FORAMINOTOMY  X 2       Home Medications    Prior to Admission medications   Medication Sig Start Date End Date Taking? Authorizing Provider  amoxicillin-clavulanate (AUGMENTIN) 875-125 MG tablet Take 1 tablet by mouth every 12 (twelve) hours. 12/18/20  Yes Jhaden Pizzuto K, PA-C  Accu-Chek Softclix Lancets lancets Check once a day 10/11/19   Claudean SeveranceKrienke, Marissa M, MD  albuterol [facility](PROAIR HFA) [phone](90 Base) MCG/ACT inhaler Inhale 1-2 puffs into the lungs every 6 (six) hours as needed for wheezing or shortness of breath. 02/13/20   Eliezer BottomAslam, Sadia, MD  allopurinol (ZYLOPRIM) 100 MG tablet Take 1 tablet (100 mg total) by mouth daily. 06/22/20   Albertha GheeSteen, James, MD  amLODipine (NORVASC) 10 MG tablet Take 1 tablet (10 mg total) by mouth daily. 05/19/20   Steffanie RainwaterAmponsah, Prosper M, MD  aspirin EC 81 MG tablet Take 1 tablet (81 mg total) by mouth daily. 03/19/15   Deneise Lever, MD  colchicine 0.6 MG tablet TAKE 1 TABLET BY MOUTH  DAILY AS NEEDED FOR GOUT  FLARE 06/22/20   Albertha Ghee, MD  glipiZIDE (GLUCOTROL) 10 MG tablet TAKE 1 TABLET BY MOUTH  TWICE A DAY BEFORE A MEAL 07/06/20   Verdene Lennert, MD  glucose blood (ACCU-CHEK GUIDE) test strip Check once a day 10/11/19   Claudean Severance, MD  Insulin Pen Needle 32G X 4 MM MISC Use to inject victoza one time a day 11/15/17   Tyson Alias, MD  liraglutide (VICTOZA) 18  MG/3ML SOPN Inject 1.8 mg into the skin daily as instructed 07/06/20   Verdene Lennert, MD  nitroGLYCERIN (NITROSTAT) 0.4 MG SL tablet DISSOLVE 1 TABLET UNDER THE TONGUE EVERY 5 MINUTES AS  NEEDED FOR CHEST PAIN , MAX 3 TABLETS IN 15 MINUTES,  CALL 911 IF NO RELIEF. 04/07/20   Katsadouros, Vasilios, MD  rosuvastatin (CRESTOR) 20 MG tablet Take 1 tablet (20 mg total) by mouth daily. 02/13/20   Eliezer Bottom, MD  tamsulosin (FLOMAX) 0.4 MG CAPS capsule Take 1 capsule (0.4 mg total) by mouth daily. 11/26/19   Eliezer Bottom, MD  valsartan (DIOVAN) 80 MG tablet Take 1 tablet (80 mg total) by mouth daily. 07/06/20 10/04/20  Verdene Lennert, MD    Family History Family History  Problem Relation Age of Onset   Diabetes Mother    Coronary artery disease Mother        HAS PACEMAKER   Heart disease Mother    Heart attack Brother    Lung disease Neg Hx     Social History Social History   Tobacco Use   Smoking status: Former    Packs/day: 1.00    Years: 32.00    Pack years: 32.00    Types: Cigarettes    Quit date: 07/06/2007    Years since quitting: 13.4   Smokeless tobacco: Never  Vaping Use   Vaping Use: Never used  Substance Use Topics   Alcohol use: Yes    Alcohol/week: 0.0 standard drinks    Comment: Sometimes beer.   Drug use: No     Allergies   Morphine and Pravastatin sodium   Review of Systems Review of Systems  Constitutional:  Positive for activity change, appetite change and fatigue. Negative for fever.  HENT:  Positive for dental problem. Negative for congestion, sinus pressure, sneezing and sore throat.   Respiratory:  Negative for cough and shortness of breath.   Cardiovascular:  Negative for chest pain.  Gastrointestinal:  Negative for abdominal pain, diarrhea, nausea and vomiting.  Neurological:  Positive for weakness (generalized). Negative for dizziness, light-headedness and headaches.    Physical Exam Triage Vital Signs ED Triage Vitals  Enc Vitals Group      BP 12/18/20 0950 135/88     Pulse Rate 12/18/20 0950 92     Resp 12/18/20 0950 18     Temp 12/18/20 0950 98.7 F (37.1 C)     Temp Source 12/18/20 0950 Oral     SpO2 12/18/20 0950 95 %     Weight --      Height --      Head Circumference --      Peak Flow --      Pain Score 12/18/20 0952 10     Pain Loc --      Pain Edu? --  Excl. in GC? --    No data found.  Updated Vital Signs BP 135/88 (BP Location: Right Arm)   Pulse 92   Temp 98.7 F (37.1 C) (Oral)   Resp 18   SpO2 95%   Visual Acuity Right Eye Distance:   Left Eye Distance:   Bilateral Distance:    Right Eye Near:   Left Eye Near:    Bilateral Near:     Physical Exam Vitals reviewed.  Constitutional:      General: He is awake.     Appearance: Normal appearance. He is normal weight. He is not ill-appearing.     Comments: Very pleasant male appears stated age in no acute distress sitting comfortably in exam room  HENT:     Head: Normocephalic and atraumatic.     Right Ear: Tympanic membrane, ear canal and external ear normal. Tympanic membrane is not erythematous or bulging.     Left Ear: Tympanic membrane, ear canal and external ear normal. Tympanic membrane is not erythematous or bulging.     Nose: Nose normal.     Mouth/Throat:     Dentition: Abnormal dentition. Dental tenderness, gingival swelling and dental caries present.     Pharynx: Uvula midline. No oropharyngeal exudate or posterior oropharyngeal erythema.      Comments: No evidence of Ludwig angina Cardiovascular:     Rate and Rhythm: Normal rate and regular rhythm.     Heart sounds: No murmur heard. Pulmonary:     Effort: Pulmonary effort is normal. No accessory muscle usage or respiratory distress.     Breath sounds: Normal breath sounds. No stridor. No wheezing, rhonchi or rales.  Abdominal:     General: Bowel sounds are normal.     Palpations: Abdomen is soft.     Tenderness: There is no abdominal tenderness.  Lymphadenopathy:      Head:     Right side of head: No submental, submandibular or tonsillar adenopathy.     Left side of head: No submental, submandibular or tonsillar adenopathy.     Cervical: No cervical adenopathy.  Neurological:     Mental Status: He is alert.  Psychiatric:        Behavior: Behavior is cooperative.     UC Treatments / Results  Labs (all labs ordered are listed, but only abnormal results are displayed) Labs Reviewed  CBC WITH DIFFERENTIAL/PLATELET  COMPREHENSIVE METABOLIC PANEL    EKG   Radiology No results found.  Procedures Procedures (including critical care time)  Medications Ordered in UC Medications  ketorolac (TORADOL) 30 MG/ML injection 30 mg (30 mg Intramuscular Given 12/18/20 1137)    Initial Impression / Assessment and Plan / UC Course  I have reviewed the triage vital signs and the nursing notes.  Pertinent labs & imaging results that were available during my care of the patient were reviewed by me and considered in my medical decision making (see chart for details).     Vital signs and physical exam reassuring today; no indication for emergent evaluation or imaging.  We will start Augmentin to treat dental infection.  Patient had questions regarding COVID-19 testing that we discussed that he has no cough, congestion, fever or other signs of COVID so we will defer treatment for the time being.  He does report some weakness so CBC and CMP were obtained we will contact patient if this is abnormal.  He was given a shot of Toradol in clinic today to help with pain; no evidence of  chronic kidney disease and he denies any recent NSAID use.  He was instructed to avoid NSAIDs for 24 hours after using his medication and can use Tylenol for breakthrough pain.  Discussed the importance of following up with a dentist.  Encouraged him to drink plenty of fluids and eat small frequent meals.  Strict return precautions given to which patient expressed understanding.  Discussed alarm  symptoms that warrant emergent evaluation to which she expressed understanding.   Final Clinical Impressions(s) / UC Diagnoses   Final diagnoses:  Dental infection  Pain, dental  Generalized weakness  Malaise and fatigue     Discharge Instructions      You are given Toradol in clinic today so should not take NSAIDs including aspirin, ibuprofen/Advil, naproxen/Aleve for 24 hours.  You can use Tylenol for additional pain relief.  Start Augmentin twice daily for 7 days to cover for infection.  It is very importantly follow-up with dentist as we discussed as you will likely have recurrent symptoms until you are able to address your broken teeth.  If you have any difficulty swallowing, shortness of breath, fever, changes in your voice, increased fatigue, feeling poorly you need to go to the hospital.  We will contact you if your lab work is abnormal.     ED Prescriptions     Medication Sig Dispense Auth. Provider   amoxicillin-clavulanate (AUGMENTIN) 875-125 MG tablet Take 1 tablet by mouth every 12 (twelve) hours. 14 tablet Marshon Bangs, Noberto Retort, PA-C      PDMP not reviewed this encounter.   Jeani Hawking, PA-C 12/18/20 1138

## 2020-12-18 NOTE — Discharge Instructions (Signed)
You are given Toradol in clinic today so should not take NSAIDs including aspirin, ibuprofen/Advil, naproxen/Aleve for 24 hours.  You can use Tylenol for additional pain relief.  Start Augmentin twice daily for 7 days to cover for infection.  It is very importantly follow-up with dentist as we discussed as you will likely have recurrent symptoms until you are able to address your broken teeth.  If you have any difficulty swallowing, shortness of breath, fever, changes in your voice, increased fatigue, feeling poorly you need to go to the hospital.  We will contact you if your lab work is abnormal.

## 2020-12-19 ENCOUNTER — Emergency Department (HOSPITAL_COMMUNITY): Payer: Medicare Other

## 2020-12-19 ENCOUNTER — Other Ambulatory Visit: Payer: Self-pay

## 2020-12-19 ENCOUNTER — Emergency Department (HOSPITAL_COMMUNITY)
Admission: EM | Admit: 2020-12-19 | Discharge: 2020-12-19 | Disposition: A | Discharge status: Home / Self Care | Payer: Medicare Other | Attending: Emergency Medicine | Admitting: Emergency Medicine

## 2020-12-19 DIAGNOSIS — Z794 Long term (current) use of insulin: Secondary | ICD-10-CM | POA: Diagnosis not present

## 2020-12-19 DIAGNOSIS — R0602 Shortness of breath: Secondary | ICD-10-CM | POA: Diagnosis not present

## 2020-12-19 DIAGNOSIS — Z8616 Personal history of COVID-19: Secondary | ICD-10-CM | POA: Insufficient documentation

## 2020-12-19 DIAGNOSIS — Z7982 Long term (current) use of aspirin: Secondary | ICD-10-CM | POA: Insufficient documentation

## 2020-12-19 DIAGNOSIS — R42 Dizziness and giddiness: Secondary | ICD-10-CM | POA: Diagnosis not present

## 2020-12-19 DIAGNOSIS — N182 Chronic kidney disease, stage 2 (mild): Secondary | ICD-10-CM | POA: Insufficient documentation

## 2020-12-19 DIAGNOSIS — Z7984 Long term (current) use of oral hypoglycemic drugs: Secondary | ICD-10-CM | POA: Diagnosis not present

## 2020-12-19 DIAGNOSIS — Z87891 Personal history of nicotine dependence: Secondary | ICD-10-CM | POA: Insufficient documentation

## 2020-12-19 DIAGNOSIS — K0889 Other specified disorders of teeth and supporting structures: Secondary | ICD-10-CM | POA: Insufficient documentation

## 2020-12-19 DIAGNOSIS — I2510 Atherosclerotic heart disease of native coronary artery without angina pectoris: Secondary | ICD-10-CM | POA: Diagnosis not present

## 2020-12-19 DIAGNOSIS — Z79899 Other long term (current) drug therapy: Secondary | ICD-10-CM | POA: Diagnosis not present

## 2020-12-19 DIAGNOSIS — E1122 Type 2 diabetes mellitus with diabetic chronic kidney disease: Secondary | ICD-10-CM | POA: Diagnosis not present

## 2020-12-19 DIAGNOSIS — I129 Hypertensive chronic kidney disease with stage 1 through stage 4 chronic kidney disease, or unspecified chronic kidney disease: Secondary | ICD-10-CM | POA: Diagnosis not present

## 2020-12-19 MED ORDER — ACETAMINOPHEN 325 MG PO TABS
650.0000 mg | ORAL_TABLET | Freq: Once | ORAL | Status: AC
  Administered 2020-12-19: 650 mg via ORAL
  Filled 2020-12-19: qty 2

## 2020-12-19 MED ORDER — NAPROXEN 500 MG PO TABS: 500.0000 mg | ORAL_TABLET | Freq: Two times a day (BID) | ORAL | 0 refills | Status: DC

## 2020-12-19 NOTE — ED Notes (Signed)
Patient transported to CT 

## 2020-12-19 NOTE — ED Provider Notes (Signed)
[facility] EMERGENCY DEPARTMENT Provider Note   CSN: [id number] Arrival date & time: 12/19/20  9373     History Chief Complaint  Patient presents with   Dental Pain    Daniel Perkins is a 65 y.o. male with a past medical history of hypertension, hyperlipidemia, diabetes presenting to the ED for continued dental pain.  States that he was diagnosed with a dental infection yesterday at urgent care and was prescribed antibiotics.  He was also given a Toradol shot.  He states that he is continuing to have pain.  Reports associated lightheadedness and shortness of breath.  These have both been intermittent for several weeks.  Yesterday he felt like he was "about to pass out when I was at [employer]."  He has been taking Tylenol with last dose being about 8 hours ago with only minimal improvement in symptoms.  States that he last saw a dentist several months ago and was told that he needed several teeth pulled but unfortunately was unable to afford to go back to that dentist.  He denies any chest pain, neck swelling, cough, vomiting, headache, blurry vision.  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   Hepatic steatosis 05/01/2019   Prostate hypertrophy 12/06/2018   Sleep apnea-like behavior 10/01/2014   Peripheral vascular disease (HCC) 02/10/2014   Microcytic anemia 02/06/2013   CTEV  (congenital talipes equinovarus)    Statin intolerance 06/29/2012   Esophageal reflux 06/29/2012   Health care maintenance 06/28/2012   Gout 06/12/2012   Obesity, Class II, BMI 35-39.9 02/20/2012   DOE (dyspnea on exertion) 02/20/2012   Uncontrolled diabetes mellitus with complications (HCC) 09/06/2009   ERECTILE DYSFUNCTION, NON-ORGANIC 02/16/2009   Hyperlipemia 01/14/2009   HYPERTENSION 01/14/2009    Past Surgical History:  Procedure Laterality Date   ANTERIOR CERVICAL DECOMP/DISCECTOMY FUSION  X 2   CARDIAC CATHETERIZATION     FOOT FRACTURE SURGERY Right 1990's   "pt a steel plate in"   FOOT SURGERY Left x16   "joints collapsed; keep getting infected"   POSTERIOR CERVICAL FUSION/FORAMINOTOMY  X 2       Family History  Problem Relation Age of Onset   Diabetes Mother    Coronary artery disease Mother        HAS PACEMAKER   Heart disease Mother    Heart attack Brother    Lung disease Neg Hx     Social History   Tobacco Use   Smoking status: Former    Packs/day: 1.00    Years: 32.00    Pack years: 32.00    Types: Cigarettes    Quit date: 07/06/2007    Years since quitting: 13.4   Smokeless tobacco: Never  Vaping Use   Vaping Use: Never used  Substance Use  Topics   Alcohol use: Yes    Alcohol/week: 0.0 standard drinks    Comment: Sometimes beer.   Drug use: No    Home Medications Prior to Admission medications   Medication Sig Start Date End Date Taking? Authorizing Provider  naproxen (NAPROSYN) 500 MG tablet Take 1 tablet (500 mg total) by mouth 2 (two) times daily. 12/19/20  Yes Donevan Biller, PA-C  Accu-Chek Softclix Lancets lancets Check once a day 10/11/19   Claudean Severance, MD  albuterol [facility] HFA) 108 [phone] Base) MCG/ACT inhaler Inhale 1-2 puffs into the lungs every 6 (six) hours as needed for wheezing or shortness of breath. 02/13/20   Eliezer Bottom, MD  allopurinol (ZYLOPRIM) 100 MG tablet Take 1 tablet (100 mg total) by mouth daily. 06/22/20   Albertha Ghee, MD  amLODipine (NORVASC) 10 MG tablet Take 1 tablet (10 mg total) by mouth daily. 05/19/20   Steffanie Rainwater, MD  amoxicillin-clavulanate (AUGMENTIN) 875-125 MG tablet Take 1 tablet by mouth every 12 (twelve) hours. 12/18/20   Raspet, Noberto Retort, PA-C  aspirin EC 81 MG tablet Take 1 tablet (81 mg total) by mouth daily. 03/19/15   Deneise Lever, MD  colchicine 0.6 MG tablet TAKE 1 TABLET BY MOUTH  DAILY AS NEEDED FOR GOUT  FLARE 06/22/20   Albertha Ghee, MD  glipiZIDE (GLUCOTROL) 10 MG tablet TAKE 1 TABLET BY MOUTH  TWICE A DAY BEFORE A MEAL 07/06/20   Verdene Lennert, MD  glucose blood (ACCU-CHEK GUIDE) test strip Check once a day 10/11/19   Claudean Severance, MD  Insulin Pen Needle 32G X 4 MM MISC Use to inject victoza one time a day 11/15/17   Tyson Alias, MD  liraglutide (VICTOZA) 18 MG/3ML SOPN Inject 1.8 mg into the skin daily as instructed 07/06/20   Verdene Lennert, MD  nitroGLYCERIN (NITROSTAT) 0.4 MG SL tablet DISSOLVE 1 TABLET UNDER THE TONGUE EVERY 5 MINUTES AS  NEEDED FOR CHEST PAIN , MAX 3 TABLETS IN 15 MINUTES,  CALL 911 IF NO RELIEF. 04/07/20   Katsadouros, Vasilios, MD  rosuvastatin (CRESTOR) 20 MG tablet Take 1 tablet (20 mg total) by mouth daily. 02/13/20   Eliezer Bottom, MD  tamsulosin (FLOMAX) 0.4 MG CAPS capsule Take 1 capsule (0.4 mg total) by mouth daily. 11/26/19   Eliezer Bottom, MD  valsartan (DIOVAN) 80 MG tablet Take 1 tablet (80 mg total) by mouth daily. 07/06/20 10/04/20  Verdene Lennert, MD    Allergies    Morphine and Pravastatin sodium  Review of Systems   Review of Systems  Constitutional:  Negative for appetite change, chills and fever.  HENT:  Positive for dental problem. Negative for ear pain, rhinorrhea, sneezing and sore throat.   Eyes:  Negative for photophobia and visual disturbance.  Respiratory:  Positive for shortness of breath. Negative for cough, chest tightness and wheezing.   Cardiovascular:  Negative for chest pain and palpitations.   Gastrointestinal:  Negative for abdominal pain, blood in stool, constipation, diarrhea, nausea and vomiting.  Genitourinary:  Negative for dysuria, hematuria and urgency.  Musculoskeletal:  Negative for myalgias.  Skin:  Negative for rash.  Neurological:  Positive for light-headedness. Negative for dizziness and weakness.   Physical Exam Updated Vital Signs BP (!) 144/86   Pulse 85   Temp 98.8 F (37.1 C) (Oral)   Resp 19   Ht  (1.702 m)   Wt 98.9 kg   SpO2 93%   BMI 34.14 kg/m   Physical Exam  Vitals and nursing note reviewed.  Constitutional:      General: He is not in acute distress.    Appearance: He is well-developed.  HENT:     Head: Normocephalic and atraumatic.     Nose: Nose normal.     Mouth/Throat:     Dentition: Abnormal dentition. Dental caries present.      Comments: Several missing and decaying teeth noted.  There is an area of induration in the indicated area without fluctuance.  No facial, neck or cheek swelling noted. No pooling of secretions or trismus.  Normal voice noted with no difficulty swallowing or breathing.  No submandibular erythema, edema or crepitus noted. Eyes:     General: No scleral icterus.       Right eye: No discharge.        Left eye: No discharge.     Conjunctiva/sclera: Conjunctivae normal.  Cardiovascular:     Rate and Rhythm: Normal rate and regular rhythm.     Heart sounds: Normal heart sounds. No murmur heard.   No friction rub. No gallop.  Pulmonary:     Effort: Pulmonary effort is normal. No respiratory distress.     Breath sounds: Normal breath sounds.  Abdominal:     General: Bowel sounds are normal. There is no distension.     Palpations: Abdomen is soft.     Tenderness: There is no abdominal tenderness. There is no guarding.  Musculoskeletal:        General: Normal range of motion.     Cervical back: Normal range of motion and neck supple.  Skin:    General: Skin is warm and dry.     Findings: No rash.   Neurological:     Mental Status: He is alert and oriented to person, place, and time.     Cranial Nerves: No cranial nerve deficit.     Sensory: No sensory deficit.     Motor: No weakness or abnormal muscle tone.     Coordination: Coordination normal.    ED Results / Procedures / Treatments   Labs (all labs ordered are listed, but only abnormal results are displayed) Labs Reviewed - No data to display  EKG EKG Interpretation  Date/Time:  Saturday December 19 2020 07:57:40 EDT Ventricular Rate:  89 PR Interval:  173 QRS Duration: 99 QT Interval:  367 QTC Calculation: 447 R Axis:   -53 Text Interpretation: Sinus rhythm Probable left atrial enlargement Left anterior fascicular block Abnormal R-wave progression, early transition Nonspecific T abnormalities, lateral leads No significant change since last tracing Confirmed by Alvira Monday ([id number]) on 12/19/2020 9:00:28 AM  Radiology DG Chest 1 View  Result Date: 12/19/2020 CLINICAL DATA:  Chest pain.  Short of breath EXAM: CHEST  1 VIEW COMPARISON:  None. FINDINGS: Normal mediastinum and cardiac silhouette. Normal pulmonary vasculature. No evidence of effusion, infiltrate, or pneumothorax. No acute bony abnormality. Anterior cervical fusion IMPRESSION: No acute cardiopulmonary process. Electronically Signed   By: Genevive Bi M.D.   On: 12/19/2020 08:33   CT HEAD WO CONTRAST ( )  Result Date: 12/19/2020 CLINICAL DATA:  Diagnosed with intl infection yesterday. Mouth pain and headache. EXAM: CT HEAD WITHOUT CONTRAST TECHNIQUE: Contiguous axial images were obtained from the base of the skull through the vertex without intravenous contrast. COMPARISON:  None. FINDINGS: Brain: No hydrocephalus. Minimal chronic small vessel ischemic changes within the deep periventricular white matter regions bilaterally. No mass, hemorrhage, edema or other evidence of acute parenchymal abnormality. No extra-axial hemorrhage. Vascular: Chronic  calcified  atherosclerotic changes of the large vessels at the skull base. No unexpected hyperdense vessel. Skull: Normal. Negative for fracture or focal lesion. Sinuses/Orbits: Mucosal thickening within the RIGHT maxillary sinus apex and ethmoid air cells, of uncertain chronicity. Orbital soft tissues are unremarkable. Other: None. IMPRESSION: 1. No acute findings. No intracranial mass, hemorrhage or edema. 2. Minimal chronic small vessel ischemic changes in the white matter. 3. Paranasal sinus disease, of uncertain chronicity but most likely chronic. Electronically Signed   By: Bary Richard M.D.   On: 12/19/2020 10:05    Procedures Procedures   Medications Ordered in ED Medications  acetaminophen (TYLENOL) tablet 650 mg (650 mg Oral Given 12/19/20 1941)    ED Course  I have reviewed the triage vital signs and the nursing notes.  Pertinent labs & imaging results that were available during my care of the patient were reviewed by me and considered in my medical decision making (see chart for details).    MDM Rules/Calculators/A&P                           65 year old male presenting to the ED for dental pain.  Was treated with antibiotics yesterday but continues to have pain.  He also tells me  He has been feeling lightheaded and short of breath intermittently for the past several weeks.  He is ambulatory here without difficulty.  His lungs are clear to auscultation bilaterally.  He does have an area of swelling in the upper gumline without fluctuance.  He has no neck swelling, submandibular erythema, edema or crepitus that would concerning for Ludwick's angina.  His vital signs within normal limits.  Will obtain EKG and chest x-ray.  Chest x-ray shows no acute findings.  EKG shows sinus rhythm, nonspecific T wave abnormalities but this is similar to prior tracings.  No STEMI.  On recheck patient states that he is having a headache.  He has no neurological deficits or trauma.  CT of the head done due to  his age and persistent headache.  This was negative for acute abnormality.  There are no headache characteristics that are lateralizing or concerning for increased ICP, infectious or vascular cause of his symptoms.   We will have him follow-up with PCP regarding his chronic symptoms.  In the meantime we will refer to dentist on-call and have him continue antibiotics and NSAIDs.  Patient is agreeable to the plan.  Return precautions given.    Patient is hemodynamically stable, in NAD, and able to ambulate in the ED. Evaluation does not show pathology that would require ongoing emergent intervention or inpatient treatment. I explained the diagnosis to the patient. Pain has been managed and has no complaints prior to discharge. Patient is comfortable with above plan and is stable for discharge at this time. All questions were answered prior to disposition. Strict return precautions for returning to the ED were discussed. Encouraged follow up with PCP.   An After Visit Summary was printed and given to the patient.   Portions of this note were generated with Scientist, clinical (histocompatibility and immunogenetics). Dictation errors may occur despite best attempts at proofreading.  Final Clinical Impression(s) / ED Diagnoses Final diagnoses:  Pain, dental    Rx / DC Orders ED Discharge Orders          Ordered    naproxen (NAPROSYN) 500 MG tablet  2 times daily        12/19/20 1017  Dietrich PatesKhatri, Matalyn Nawaz, PA-C 12/19/20 1018    Alvira MondaySchlossman, Erin, MD 12/19/20 2236

## 2020-12-19 NOTE — Discharge Instructions (Addendum)
Continue your home medications.  Cannot do the antibiotics.  This may take 1 more day before you start seeing an effect. Take the pain medicine as needed. Is important for you to follow-up with a dentist.  I have referred you to 1 below. Return to the ER for worsening pain, blurry vision, numbness in arms or legs or chest pain.

## 2020-12-19 NOTE — ED Triage Notes (Signed)
Pt reported to ED with c/o pain tooth, mouth and head after recently being diagnosed with dental infection yesterday at urgent care. Pt states that he feels his gums are swelling around affected area. Pt states he "has to get a lot of teeth pulled".

## 2021-02-10 ENCOUNTER — Encounter: Payer: Self-pay | Admitting: Interventional Cardiology

## 2021-02-10 ENCOUNTER — Other Ambulatory Visit: Payer: Self-pay

## 2021-02-10 ENCOUNTER — Ambulatory Visit (INDEPENDENT_AMBULATORY_CARE_PROVIDER_SITE_OTHER): Payer: Medicare Other | Admitting: Interventional Cardiology

## 2021-02-10 VITALS — BP 130/78 | HR 85 | Ht 67.0 in | Wt 209.8 lb

## 2021-02-10 DIAGNOSIS — E785 Hyperlipidemia, unspecified: Secondary | ICD-10-CM

## 2021-02-10 DIAGNOSIS — I739 Peripheral vascular disease, unspecified: Secondary | ICD-10-CM | POA: Diagnosis not present

## 2021-02-10 DIAGNOSIS — R072 Precordial pain: Secondary | ICD-10-CM

## 2021-02-10 DIAGNOSIS — E1159 Type 2 diabetes mellitus with other circulatory complications: Secondary | ICD-10-CM

## 2021-02-10 DIAGNOSIS — I10 Essential (primary) hypertension: Secondary | ICD-10-CM

## 2021-02-10 MED ORDER — ROSUVASTATIN CALCIUM 10 MG PO TABS: 10.0000 mg | ORAL_TABLET | Freq: Every day | ORAL | 3 refills | Status: DC

## 2021-02-10 NOTE — Patient Instructions (Addendum)
Medication Instructions:  Your physician has recommended you make the following change in your medication: Increase Rosuvastatin to 10 mg by mouth daily  *If you need a refill on your cardiac medications before your next appointment, please call your pharmacy*   Lab Work: Your physician recommends that you return for lab work in: 3 months--CMET and Lipid profile.  This will be fasting.  The lab opens at 7:30 AM  Lab work to be done today--BMP  If you have labs (blood work) drawn today and your tests are completely normal, you will receive your results only by: MyChart Message (if you have MyChart) OR A paper copy in the mail If you have any lab test that is abnormal or we need to change your treatment, we will call you to review the results.   Testing/Procedures: Your physician has requested that you have cardiac CT. Cardiac computed tomography (CT) is a painless test that uses an x-ray machine to take clear, detailed pictures of your heart. For further information please visit [email or web address] Please follow instruction sheet as given.     Follow-Up: At [facility], you and your health needs are our priority.  As part of our continuing mission to provide you with exceptional heart care, we have created designated Provider Care Teams.  These Care Teams include your primary Cardiologist (physician) and Advanced Practice Providers (APPs -  Physician Assistants and Nurse Practitioners) who all work together to provide you with the care you need, when you need it.  We recommend signing up for the patient portal called "MyChart".  Sign up information is provided on this After Visit Summary.  MyChart is used to connect with patients for Virtual Visits (Telemedicine).  Patients are able to view lab/test results, encounter notes, upcoming appointments, etc.  Non-urgent messages can be sent to your provider as well.   To learn more about what you can do with MyChart, go to  [email or web address].    Your next appointment:   12 month(s)  The format for your next appointment:   In Person  Provider:   You may see Lance Muss, MD or one of the following Advanced Practice Providers on your designated Care Team:   Ronie Spies, PA-C Jacolyn Reedy, PA-C   Other Instructions Please schedule patient for follow up with Dr Kirke Corin     Your cardiac CT will be scheduled at one of the below locations:   [facility] [address] [place], [place] [address] [phone]  OR  [facility] [address] [place], [place] [address] [phone]  If scheduled at [facility], please arrive at the [facility] main entrance (entrance A) of [facility] 30 minutes prior to test start time. Proceed to the [facility] Radiology Department (first floor) to check-in and test prep.  If scheduled at [facility], please arrive 15 mins early for check-in and test prep.  Please follow these instructions carefully (unless otherwise directed):  Hold all erectile dysfunction medications at least 3 days (72 hrs) prior to test.  On the Night Before the Test: Be sure to Drink plenty of water. Do not consume any caffeinated/decaffeinated beverages or chocolate 12 hours prior to your test. Do not take any antihistamines 12 hours prior to your test.  On the Day of the Test: Drink plenty of water until 1 hour prior to the test. Do not eat any food 4 hours prior to the test. You may take your  regular medications prior to the test.  Take Carvedilol 25 mg the morning of the test.  Take 2 hours prior to CT Scan HOLD Furosemide/Hydrochlorothiazide morning of the test.          After the Test: Drink plenty of water. After receiving IV contrast, you may experience a mild flushed feeling. This is normal. On occasion, you may experience a mild rash up to 24 hours after the  test. This is not dangerous. If this occurs, you can take Benadryl 25 mg and increase your fluid intake. If you experience trouble breathing, this can be serious. If it is severe call 911 IMMEDIATELY. If it is mild, please call our office. If you take any of these medications: Glipizide/Metformin, Avandament, Glucavance, please do not take 48 hours after completing test unless otherwise instructed.  Please allow 2-4 weeks for scheduling of routine cardiac CTs. Some insurance companies require a pre-authorization which may delay scheduling of this test.   For non-scheduling related questions, please contact the cardiac imaging nurse navigator should you have any questions/concerns: Rockwell Alexandria, Cardiac Imaging Nurse Navigator Larey Brick, Cardiac Imaging Nurse Navigator [facility] Direct Office Dial: [phone]   For scheduling needs, including cancellations and rescheduling, please call [place], [phone].

## 2021-02-10 NOTE — Progress Notes (Signed)
Cardiology Office Note   Date:  02/10/2021   ID:  Elray Dains, DOB 1956/02/06, MRN [id number]  PCP:  Ellyn Hack, MD    No chief complaint on file.  DOE  [employer] Readings from Last 3 Encounters:  02/10/21 209 lb 12.8 oz (95.2 kg)  12/19/20 218 lb (98.9 kg)  07/06/20 226 lb 9.6 oz (102.8 kg)       History of Present Illness: Daniel Perkins is a 65 y.o. male who is being seen today for the evaluation of DOE/SHOB at the request of Aslam, Leanna Sato, MD.   Prior records show: "has history of minimal coronary plaque by previous cardiac catheterization, diabetes, hypertension and hyperlipidemia.  He quit smoking in 2010.  "  " CTA of the coronary arteries in May 2019  showed a calcium score of 5 with normal coronary arteries.  "  Echo in 2020 showed: "The left ventricle has normal systolic function with an ejection  fraction of 60-65%. The cavity size was normal. There is moderately  increased left ventricular wall thickness. Left ventricular diastolic  Doppler parameters are consistent with impaired   relaxation Indeterminent filling pressures The E/e' is 8-15.   2. The right ventricle has normal systolic function. The cavity was  normal. There is no increase in right ventricular wall thickness.   3. The mitral valve is degenerative. Mild thickening of the mitral valve  leaflet.   4. The tricuspid valve is normal in structure.   5. The aortic valve is tricuspid Mild calcification of the aortic valve.  Aortic valve regurgitation is trivial by color flow Doppler.   6. The aortic root and ascending aorta are normal in size and structure.   7. The interatrial septum was not well visualized.   8. When compared to the prior study: 08/11/10 EF 65-70%. "  2018 stress test showed: "Low risk Myoview. EF is normal at 61% and normal resting and stress perfusion."  Past Medical History:  Diagnosis Date   Club foot of both lower extremities 1958   Coronary artery disease     Holter monitor 02/2012 - sinus with rare PVC   CTEV (congenital talipes equinovarus)    Gout    Hyperlipidemia    Hypertension    Myocardial infarction [facility]) ~ 2007   "mild"   Osteomyelitis (HCC)    Pneumonia 2000's   "couple times"   Pneumonia due to COVID-19 virus 04/28/2019   Type II diabetes mellitus (HCC) 2011    Past Surgical History:  Procedure Laterality Date   ANTERIOR CERVICAL DECOMP/DISCECTOMY FUSION  X 2   CARDIAC CATHETERIZATION     FOOT FRACTURE SURGERY Right 1990's   "pt a steel plate in"   FOOT SURGERY Left x16   "joints collapsed; keep getting infected"   POSTERIOR CERVICAL FUSION/FORAMINOTOMY  X 2     Current Outpatient Medications  Medication Sig Dispense Refill   Accu-Chek Softclix Lancets lancets Check once a day 100 each 12   allopurinol (ZYLOPRIM) 100 MG tablet Take 1 tablet (100 mg total) by mouth daily. 90 tablet 2   amLODipine (NORVASC) 10 MG tablet Take 1 tablet (10 mg total) by mouth daily. 90 tablet 1   aspirin EC 81 MG tablet Take 1 tablet (81 mg total) by mouth daily. 30 tablet 3   carvedilol (COREG) 12.5 MG tablet Take 12.5 mg by mouth 2 (two) times daily.     colchicine 0.6 MG tablet TAKE 1 TABLET BY MOUTH  DAILY AS  NEEDED FOR GOUT  FLARE 30 tablet 3   gabapentin (NEURONTIN) 300 MG capsule Take 300 mg by mouth 3 (three) times daily.     glipiZIDE (GLUCOTROL) 10 MG tablet TAKE 1 TABLET BY MOUTH  TWICE A DAY BEFORE A MEAL 180 tablet 0   glucose blood (ACCU-CHEK GUIDE) test strip Check once a day 100 each 12   Insulin Pen Needle 32G X 4 MM MISC Use to inject victoza one time a day 100 each 3   JARDIANCE 10 MG TABS tablet Take 10 mg by mouth every morning.     liraglutide (VICTOZA) 18 MG/3ML SOPN Inject 1.8 mg into the skin daily as instructed 9 mL 1   nitroGLYCERIN (NITROSTAT) 0.4 MG SL tablet DISSOLVE 1 TABLET UNDER THE TONGUE EVERY 5 MINUTES AS  NEEDED FOR CHEST PAIN , MAX 3 TABLETS IN 15 MINUTES,  CALL 911 IF NO RELIEF. 25 tablet 1    rosuvastatin (CRESTOR) 5 MG tablet Take 5 mg by mouth daily.     tamsulosin (FLOMAX) 0.4 MG CAPS capsule Take 1 capsule (0.4 mg total) by mouth daily. 90 capsule 1   valsartan (DIOVAN) 160 MG tablet Take 160 mg by mouth daily.     albuterol (PROAIR HFA) 108 (90 Base) MCG/ACT inhaler Inhale 1-2 puffs into the lungs every 6 (six) hours as needed for wheezing or shortness of breath. (Patient not taking: Reported on 02/10/2021) 8 g 1   amoxicillin-clavulanate (AUGMENTIN) 875-125 MG tablet Take 1 tablet by mouth every 12 (twelve) hours. (Patient not taking: Reported on 02/10/2021) 14 tablet 0   naproxen (NAPROSYN) 500 MG tablet Take 1 tablet (500 mg total) by mouth 2 (two) times daily. (Patient not taking: Reported on 02/10/2021) 10 tablet 0   rosuvastatin (CRESTOR) 20 MG tablet Take 1 tablet (20 mg total) by mouth daily. (Patient not taking: Reported on 02/10/2021) 90 tablet 1   valsartan (DIOVAN) 80 MG tablet Take 1 tablet (80 mg total) by mouth daily. 30 tablet 2   No current facility-administered medications for this visit.    Allergies:   Morphine and Pravastatin sodium    Social History:  The patient  reports that he quit smoking about 13 years ago. His smoking use included cigarettes. He has a 32.00 pack-year smoking history. He has never used smokeless tobacco. He reports current alcohol use. He reports that he does not use drugs.   Family History:  The patient's family history includes Coronary artery disease in his mother; Diabetes in his mother; Heart attack in his brother; Heart disease in his mother.    ROS:  Please see the history of present illness.   Otherwise, review of systems are positive for leg cramps.   All other systems are reviewed and negative.    PHYSICAL EXAM: VS:  BP 130/78   Pulse 85   Ht 5\' 7"  (1.702 m)   Wt 209 lb 12.8 oz (95.2 kg)   SpO2 97%   BMI 32.86 kg/m  , BMI Body mass index is 32.86 kg/m. GEN: Well nourished, well developed, in no acute distress HEENT:  normal Neck: no JVD, carotid bruits, or masses Cardiac: RRR; no murmurs, rubs, or gallops,no edema  Respiratory:  clear to auscultation bilaterally, normal work of breathing GI: soft, nontender, nondistended, + BS MS: no deformity or atrophy Skin: warm and dry, no rash Neuro:  Strength and sensation are intact Psych: euthymic mood, full affect   EKG:   The ekg ordered in 12/2020 demonstrates NSR, IVCD  Recent Labs: 12/18/2020: ALT 26; BUN 11; Creatinine, Ser 0.99; Hemoglobin 13.8; Platelets 354; Potassium 3.8; Sodium 135   Lipid Panel    Component Value Date/Time   CHOL 213 (H) 02/13/2020 1116   TRIG 213 (H) 02/13/2020 1116   HDL 39 (L) 02/13/2020 1116   CHOLHDL 5.5 (H) 02/13/2020 1116   CHOLHDL 6.1 06/30/2014 1634   VLDL 45 (H) 06/30/2014 1634   LDLCALC 136 (H) 02/13/2020 1116     Other studies Reviewed: Additional studies/ records that were reviewed today with results demonstrating: prior test results reviewed.   ASSESSMENT AND PLAN:  DOE/chest pressure: Worse with exertion.  Plan for CTA coronaries. Known coronary calcification.  Take 25 mg of carvedilol prior to the CTA. PAD: Follows with Dr. Kirke Corin.  No nonhealing sores.  Abnormal ABIs in the past.  Try to increase exercise to improve circulation. DM: Whole food, plant based diet. A1C 7.7.   Hyperlipidemia: Did not tolerate pravastatin in the past.  Now on low dose rosuvastatin but needs to be on higher potency med given DM. Increase rosuvastatin to 10 mg daily.  Labs including lipids and liver test in 3 months.  If possible, would like to try to get him on a high potency statin.  He has had problems with the pravastatin so we will have to be careful to watch for any side effects. HTN: The current medical regimen is effective;  continue present plan and medications.  Avoid processed foods.  He does eat a fair amount of canned foods but he tries to rinse off the salty liquid from the vegetables.    Current medicines are  reviewed at length with the patient today.  The patient concerns regarding his medicines were addressed.  The following changes have been made:  No change  Labs/ tests ordered today include:  No orders of the defined types were placed in this encounter.   Recommend 150 minutes/week of aerobic exercise Low fat, low carb, high fiber diet recommended  Disposition:   FU for CTA   Signed, Lance Muss, MD  02/10/2021 2:34 PM    [facility] [address] [place], [place], [place]  [address] Phone: [phone]; Fax: [phone]

## 2021-02-11 LAB — BASIC METABOLIC PANEL
BUN/Creatinine Ratio: 11 (ref 10–24)
BUN: 13 mg/dL (ref 8–27)
CO2: 22 mmol/L (ref 20–29)
Calcium: 9.7 mg/dL (ref 8.6–10.2)
Chloride: 101 mmol/L (ref 96–106)
Creatinine, Ser: 1.17 mg/dL (ref 0.76–1.27)
Glucose: 65 mg/dL — ABNORMAL LOW (ref 70–99)
Potassium: 4.6 mmol/L (ref 3.5–5.2)
Sodium: 137 mmol/L (ref 134–144)
eGFR: 70 mL/min/{1.73_m2} (ref >59)

## 2021-02-19 ENCOUNTER — Other Ambulatory Visit: Payer: Self-pay | Admitting: Internal Medicine

## 2021-02-19 ENCOUNTER — Telehealth (HOSPITAL_COMMUNITY): Payer: Self-pay | Admitting: *Deleted

## 2021-02-19 DIAGNOSIS — E119 Type 2 diabetes mellitus without complications: Secondary | ICD-10-CM

## 2021-02-19 NOTE — Telephone Encounter (Signed)
Attempted to call patient regarding upcoming cardiac CT appointment. °Left message on voicemail with name and callback number ° °Rissie Sculley RN Navigator Cardiac Imaging °[facility] °[phone] Office °[phone] Cell ° °

## 2021-02-22 ENCOUNTER — Other Ambulatory Visit: Payer: Self-pay

## 2021-02-22 ENCOUNTER — Ambulatory Visit (HOSPITAL_COMMUNITY)
Admission: RE | Admit: 2021-02-22 | Discharge: 2021-02-22 | Disposition: A | Discharge status: Home / Self Care | Payer: Medicare Other | Source: Ambulatory Visit | Attending: Interventional Cardiology | Admitting: Interventional Cardiology

## 2021-02-22 ENCOUNTER — Other Ambulatory Visit: Payer: Self-pay | Admitting: Interventional Cardiology

## 2021-02-22 DIAGNOSIS — R072 Precordial pain: Secondary | ICD-10-CM | POA: Insufficient documentation

## 2021-02-22 MED ORDER — METOPROLOL TARTRATE 5 MG/5ML IV SOLN
10.0000 mg | INTRAVENOUS | Status: DC | PRN
  Administered 2021-02-22: 10 mg via INTRAVENOUS

## 2021-02-22 MED ORDER — METOPROLOL TARTRATE 5 MG/5ML IV SOLN
INTRAVENOUS | Status: AC
  Filled 2021-02-22: qty 20

## 2021-02-22 MED ORDER — NITROGLYCERIN 0.4 MG SL SUBL: 0.8000 mg | SUBLINGUAL_TABLET | Freq: Once | SUBLINGUAL | Status: DC

## 2021-02-22 MED ORDER — DILTIAZEM HCL 25 MG/5ML IV SOLN
10.0000 mg | INTRAVENOUS | Status: DC | PRN
Start: 2021-02-22 — End: 2021-02-23

## 2021-02-22 MED ORDER — DILTIAZEM HCL 25 MG/5ML IV SOLN
INTRAVENOUS | Status: AC
  Administered 2021-02-22: 5 mg via INTRAVENOUS
  Filled 2021-02-22: qty 5

## 2021-02-25 ENCOUNTER — Telehealth: Payer: Self-pay | Admitting: *Deleted

## 2021-02-25 NOTE — Telephone Encounter (Signed)
Left message to call office

## 2021-02-25 NOTE — Telephone Encounter (Signed)
-----   Message from Corky Crafts, MD sent at 02/23/2021 10:23 PM EDT ----- Mild calcification of the coronary arteries.  Continue with Crestor.  How are his sx doing?.  Due to his HR, they could not do full CTA study.  THerefore, if he is still having any DOE or chest pressure, would have to do nuclear stress test.

## 2021-03-04 ENCOUNTER — Inpatient Hospital Stay (HOSPITAL_COMMUNITY): Payer: Medicare Other

## 2021-03-04 ENCOUNTER — Other Ambulatory Visit: Payer: Self-pay

## 2021-03-04 ENCOUNTER — Inpatient Hospital Stay (HOSPITAL_COMMUNITY)
Admission: EM | Admit: 2021-03-04 | Discharge: 2021-03-25 | DRG: 329 | Disposition: A | Discharge status: Rehabilitation Facility | Payer: Medicare Other | Attending: Student | Admitting: Student

## 2021-03-04 ENCOUNTER — Emergency Department (HOSPITAL_COMMUNITY): Payer: Medicare Other

## 2021-03-04 DIAGNOSIS — I2510 Atherosclerotic heart disease of native coronary artery without angina pectoris: Secondary | ICD-10-CM | POA: Diagnosis present

## 2021-03-04 DIAGNOSIS — E1140 Type 2 diabetes mellitus with diabetic neuropathy, unspecified: Secondary | ICD-10-CM | POA: Diagnosis present

## 2021-03-04 DIAGNOSIS — M1A9XX0 Chronic gout, unspecified, without tophus (tophi): Secondary | ICD-10-CM

## 2021-03-04 DIAGNOSIS — E669 Obesity, unspecified: Secondary | ICD-10-CM | POA: Diagnosis present

## 2021-03-04 DIAGNOSIS — Z4659 Encounter for fitting and adjustment of other gastrointestinal appliance and device: Secondary | ICD-10-CM

## 2021-03-04 DIAGNOSIS — K651 Peritoneal abscess: Secondary | ICD-10-CM | POA: Diagnosis not present

## 2021-03-04 DIAGNOSIS — Z888 Allergy status to other drugs, medicaments and biological substances status: Secondary | ICD-10-CM

## 2021-03-04 DIAGNOSIS — R0609 Other forms of dyspnea: Secondary | ICD-10-CM | POA: Diagnosis not present

## 2021-03-04 DIAGNOSIS — N182 Chronic kidney disease, stage 2 (mild): Secondary | ICD-10-CM | POA: Diagnosis present

## 2021-03-04 DIAGNOSIS — I252 Old myocardial infarction: Secondary | ICD-10-CM

## 2021-03-04 DIAGNOSIS — N179 Acute kidney failure, unspecified: Secondary | ICD-10-CM | POA: Diagnosis not present

## 2021-03-04 DIAGNOSIS — Z7984 Long term (current) use of oral hypoglycemic drugs: Secondary | ICD-10-CM

## 2021-03-04 DIAGNOSIS — Q6689 Other  specified congenital deformities of feet: Secondary | ICD-10-CM

## 2021-03-04 DIAGNOSIS — E1151 Type 2 diabetes mellitus with diabetic peripheral angiopathy without gangrene: Secondary | ICD-10-CM | POA: Diagnosis present

## 2021-03-04 DIAGNOSIS — D72825 Bandemia: Secondary | ICD-10-CM | POA: Diagnosis not present

## 2021-03-04 DIAGNOSIS — G8929 Other chronic pain: Secondary | ICD-10-CM | POA: Diagnosis present

## 2021-03-04 DIAGNOSIS — D509 Iron deficiency anemia, unspecified: Secondary | ICD-10-CM | POA: Diagnosis not present

## 2021-03-04 DIAGNOSIS — Z6832 Body mass index (BMI) 32.0-32.9, adult: Secondary | ICD-10-CM | POA: Diagnosis not present

## 2021-03-04 DIAGNOSIS — J9811 Atelectasis: Secondary | ICD-10-CM | POA: Diagnosis not present

## 2021-03-04 DIAGNOSIS — E785 Hyperlipidemia, unspecified: Secondary | ICD-10-CM | POA: Diagnosis present

## 2021-03-04 DIAGNOSIS — Z79899 Other long term (current) drug therapy: Secondary | ICD-10-CM

## 2021-03-04 DIAGNOSIS — Z7982 Long term (current) use of aspirin: Secondary | ICD-10-CM

## 2021-03-04 DIAGNOSIS — Z833 Family history of diabetes mellitus: Secondary | ICD-10-CM

## 2021-03-04 DIAGNOSIS — K9189 Other postprocedural complications and disorders of digestive system: Secondary | ICD-10-CM | POA: Diagnosis not present

## 2021-03-04 DIAGNOSIS — Y838 Other surgical procedures as the cause of abnormal reaction of the patient, or of later complication, without mention of misadventure at the time of the procedure: Secondary | ICD-10-CM | POA: Diagnosis not present

## 2021-03-04 DIAGNOSIS — E0865 Diabetes mellitus due to underlying condition with hyperglycemia: Secondary | ICD-10-CM | POA: Diagnosis not present

## 2021-03-04 DIAGNOSIS — I10 Essential (primary) hypertension: Secondary | ICD-10-CM | POA: Diagnosis not present

## 2021-03-04 DIAGNOSIS — K5651 Intestinal adhesions [bands], with partial obstruction: Secondary | ICD-10-CM | POA: Diagnosis present

## 2021-03-04 DIAGNOSIS — Z01810 Encounter for preprocedural cardiovascular examination: Secondary | ICD-10-CM | POA: Diagnosis not present

## 2021-03-04 DIAGNOSIS — K5900 Constipation, unspecified: Secondary | ICD-10-CM

## 2021-03-04 DIAGNOSIS — E119 Type 2 diabetes mellitus without complications: Secondary | ICD-10-CM | POA: Diagnosis not present

## 2021-03-04 DIAGNOSIS — Z8249 Family history of ischemic heart disease and other diseases of the circulatory system: Secondary | ICD-10-CM

## 2021-03-04 DIAGNOSIS — K567 Ileus, unspecified: Secondary | ICD-10-CM | POA: Diagnosis not present

## 2021-03-04 DIAGNOSIS — Z0189 Encounter for other specified special examinations: Secondary | ICD-10-CM

## 2021-03-04 DIAGNOSIS — E440 Moderate protein-calorie malnutrition: Secondary | ICD-10-CM | POA: Diagnosis present

## 2021-03-04 DIAGNOSIS — E1165 Type 2 diabetes mellitus with hyperglycemia: Secondary | ICD-10-CM | POA: Diagnosis present

## 2021-03-04 DIAGNOSIS — K56609 Unspecified intestinal obstruction, unspecified as to partial versus complete obstruction: Secondary | ICD-10-CM | POA: Diagnosis present

## 2021-03-04 DIAGNOSIS — M109 Gout, unspecified: Secondary | ICD-10-CM | POA: Diagnosis present

## 2021-03-04 DIAGNOSIS — Z8616 Personal history of COVID-19: Secondary | ICD-10-CM | POA: Diagnosis not present

## 2021-03-04 DIAGNOSIS — K631 Perforation of intestine (nontraumatic): Secondary | ICD-10-CM | POA: Diagnosis present

## 2021-03-04 DIAGNOSIS — E871 Hypo-osmolality and hyponatremia: Secondary | ICD-10-CM | POA: Diagnosis not present

## 2021-03-04 DIAGNOSIS — Z87891 Personal history of nicotine dependence: Secondary | ICD-10-CM

## 2021-03-04 DIAGNOSIS — M542 Cervicalgia: Secondary | ICD-10-CM | POA: Diagnosis present

## 2021-03-04 DIAGNOSIS — Z23 Encounter for immunization: Secondary | ICD-10-CM

## 2021-03-04 DIAGNOSIS — E875 Hyperkalemia: Secondary | ICD-10-CM | POA: Diagnosis not present

## 2021-03-04 DIAGNOSIS — T8143XA Infection following a procedure, organ and space surgical site, initial encounter: Secondary | ICD-10-CM | POA: Diagnosis not present

## 2021-03-04 DIAGNOSIS — I129 Hypertensive chronic kidney disease with stage 1 through stage 4 chronic kidney disease, or unspecified chronic kidney disease: Secondary | ICD-10-CM | POA: Diagnosis present

## 2021-03-04 DIAGNOSIS — Z20822 Contact with and (suspected) exposure to covid-19: Secondary | ICD-10-CM | POA: Diagnosis present

## 2021-03-04 DIAGNOSIS — R7982 Elevated C-reactive protein (CRP): Secondary | ICD-10-CM | POA: Diagnosis present

## 2021-03-04 DIAGNOSIS — Z981 Arthrodesis status: Secondary | ICD-10-CM

## 2021-03-04 DIAGNOSIS — R5381 Other malaise: Secondary | ICD-10-CM | POA: Diagnosis not present

## 2021-03-04 DIAGNOSIS — Z794 Long term (current) use of insulin: Secondary | ICD-10-CM

## 2021-03-04 DIAGNOSIS — E1122 Type 2 diabetes mellitus with diabetic chronic kidney disease: Secondary | ICD-10-CM | POA: Diagnosis present

## 2021-03-04 DIAGNOSIS — Z885 Allergy status to narcotic agent status: Secondary | ICD-10-CM

## 2021-03-04 DIAGNOSIS — K529 Noninfective gastroenteritis and colitis, unspecified: Secondary | ICD-10-CM | POA: Diagnosis not present

## 2021-03-04 DIAGNOSIS — E860 Dehydration: Secondary | ICD-10-CM | POA: Diagnosis present

## 2021-03-04 DIAGNOSIS — D649 Anemia, unspecified: Secondary | ICD-10-CM | POA: Diagnosis not present

## 2021-03-04 LAB — CBC WITH DIFFERENTIAL/PLATELET
Abs Immature Granulocytes: 0.04 10*3/uL (ref 0.00–0.07)
Basophils Absolute: 0 10*3/uL (ref 0.0–0.1)
Basophils Relative: 0 %
Eosinophils Absolute: 0.2 10*3/uL (ref 0.0–0.5)
Eosinophils Relative: 2 %
HCT: 41.5 % (ref 39.0–52.0)
Hemoglobin: 13 g/dL (ref 13.0–17.0)
Immature Granulocytes: 0 %
Lymphocytes Relative: 16 %
Lymphs Abs: 1.4 10*3/uL (ref 0.7–4.0)
MCH: 22.7 pg — ABNORMAL LOW (ref 26.0–34.0)
MCHC: 31.3 g/dL (ref 30.0–36.0)
MCV: 72.4 fL — ABNORMAL LOW (ref 80.0–100.0)
Monocytes Absolute: 0.9 10*3/uL (ref 0.1–1.0)
Monocytes Relative: 10 %
Neutro Abs: 6.6 10*3/uL (ref 1.7–7.7)
Neutrophils Relative %: 72 %
Platelets: 347 10*3/uL (ref 150–400)
RBC: 5.73 MIL/uL (ref 4.22–5.81)
RDW: 16.5 % — ABNORMAL HIGH (ref 11.5–15.5)
WBC: 9.2 10*3/uL (ref 4.0–10.5)
nRBC: 0 % (ref 0.0–0.2)

## 2021-03-04 LAB — COMPREHENSIVE METABOLIC PANEL
ALT: 14 U/L (ref 0–44)
AST: 20 U/L (ref 15–41)
Albumin: 3.5 g/dL (ref 3.5–5.0)
Alkaline Phosphatase: 93 U/L (ref 38–126)
Anion gap: 10 (ref 5–15)
BUN: 20 mg/dL (ref 8–23)
CO2: 20 mmol/L — ABNORMAL LOW (ref 22–32)
Calcium: 9.5 mg/dL (ref 8.9–10.3)
Chloride: 101 mmol/L (ref 98–111)
Creatinine, Ser: 1.19 mg/dL (ref 0.61–1.24)
GFR, Estimated: >60 mL/min (ref >60)
Glucose, Bld: 168 mg/dL — ABNORMAL HIGH (ref 70–99)
Potassium: 4.1 mmol/L (ref 3.5–5.1)
Sodium: 131 mmol/L — ABNORMAL LOW (ref 135–145)
Total Bilirubin: 1 mg/dL (ref 0.3–1.2)
Total Protein: 8 g/dL (ref 6.5–8.1)

## 2021-03-04 LAB — HEMOGLOBIN A1C
Hgb A1c MFr Bld: 6 % — ABNORMAL HIGH (ref 4.8–5.6)
Mean Plasma Glucose: 125.5 mg/dL

## 2021-03-04 LAB — URINALYSIS, ROUTINE W REFLEX MICROSCOPIC
Bacteria, UA: NONE SEEN
Bilirubin Urine: NEGATIVE
Glucose, UA: >=500 mg/dL — AB
Hgb urine dipstick: NEGATIVE
Ketones, ur: 20 mg/dL — AB
Leukocytes,Ua: NEGATIVE
Nitrite: NEGATIVE
Protein, ur: NEGATIVE mg/dL
Specific Gravity, Urine: 1.02 (ref 1.005–1.030)
pH: 5 (ref 5.0–8.0)

## 2021-03-04 LAB — CBG MONITORING, ED
Glucose-Capillary: 164 mg/dL — ABNORMAL HIGH (ref 70–99)
Glucose-Capillary: 127 mg/dL — ABNORMAL HIGH (ref 70–99)

## 2021-03-04 LAB — LACTIC ACID, PLASMA: Lactic Acid, Venous: 1.4 mmol/L (ref 0.5–1.9)

## 2021-03-04 LAB — RESP PANEL BY RT-PCR (FLU A&B, COVID) ARPGX2
Influenza A by PCR: NEGATIVE
Influenza B by PCR: NEGATIVE
SARS Coronavirus 2 by RT PCR: NEGATIVE

## 2021-03-04 MED ORDER — IOHEXOL 300 MG/ML  SOLN
100.0000 mL | Freq: Once | INTRAMUSCULAR | Status: AC | PRN
  Administered 2021-03-04: 100 mL via INTRAVENOUS

## 2021-03-04 MED ORDER — MORPHINE SULFATE (PF) 4 MG/ML IV SOLN
4.0000 mg | INTRAVENOUS | Status: DC | PRN
  Administered 2021-03-04 – 2021-03-16 (×5): 4 mg via INTRAVENOUS
  Filled 2021-03-04 (×5): qty 1

## 2021-03-04 MED ORDER — KETOROLAC TROMETHAMINE 30 MG/ML IJ SOLN
30.0000 mg | Freq: Four times a day (QID) | INTRAMUSCULAR | Status: DC | PRN
Start: 2021-03-04 — End: 2021-03-05
  Administered 2021-03-04 – 2021-03-05 (×3): 30 mg via INTRAVENOUS
  Filled 2021-03-04 (×3): qty 1

## 2021-03-04 MED ORDER — KETOROLAC TROMETHAMINE 30 MG/ML IJ SOLN
30.0000 mg | Freq: Once | INTRAMUSCULAR | Status: AC
  Administered 2021-03-04: 30 mg via INTRAVENOUS
  Filled 2021-03-04: qty 1

## 2021-03-04 MED ORDER — ACETAMINOPHEN 650 MG RE SUPP: 650.0000 mg | Freq: Four times a day (QID) | RECTAL | Status: DC | PRN

## 2021-03-04 MED ORDER — LABETALOL HCL 5 MG/ML IV SOLN: 10.0000 mg | INTRAVENOUS | Status: DC | PRN

## 2021-03-04 MED ORDER — ROSUVASTATIN CALCIUM 5 MG PO TABS: 5.0000 mg | ORAL_TABLET | Freq: Every day | ORAL | Status: DC

## 2021-03-04 MED ORDER — ONDANSETRON HCL 4 MG/2ML IJ SOLN
4.0000 mg | Freq: Four times a day (QID) | INTRAMUSCULAR | Status: DC | PRN
  Administered 2021-03-04 – 2021-03-22 (×2): 4 mg via INTRAVENOUS
  Filled 2021-03-04 (×3): qty 2

## 2021-03-04 MED ORDER — HEPARIN SODIUM (PORCINE) 5000 UNIT/ML IJ SOLN
5000.0000 [IU] | Freq: Three times a day (TID) | INTRAMUSCULAR | Status: DC
  Administered 2021-03-04 – 2021-03-10 (×19): 5000 [IU] via SUBCUTANEOUS
  Filled 2021-03-04 (×19): qty 1

## 2021-03-04 MED ORDER — NITROGLYCERIN 0.4 MG SL SUBL: 0.4000 mg | SUBLINGUAL_TABLET | SUBLINGUAL | Status: DC | PRN

## 2021-03-04 MED ORDER — EMPAGLIFLOZIN 10 MG PO TABS
10.0000 mg | ORAL_TABLET | Freq: Every morning | ORAL | Status: DC
  Administered 2021-03-04: 10 mg via ORAL
  Filled 2021-03-04 (×2): qty 1

## 2021-03-04 MED ORDER — INSULIN ASPART 100 UNIT/ML IJ SOLN
0.0000 [IU] | Freq: Three times a day (TID) | INTRAMUSCULAR | Status: DC
  Administered 2021-03-04: 3 [IU] via SUBCUTANEOUS
  Administered 2021-03-04 – 2021-03-05 (×3): 4 [IU] via SUBCUTANEOUS
  Administered 2021-03-06 – 2021-03-11 (×10): 3 [IU] via SUBCUTANEOUS

## 2021-03-04 MED ORDER — ONDANSETRON HCL 4 MG/2ML IJ SOLN
4.0000 mg | Freq: Once | INTRAMUSCULAR | Status: AC
  Administered 2021-03-04: 4 mg via INTRAVENOUS
  Filled 2021-03-04: qty 2

## 2021-03-04 MED ORDER — GABAPENTIN 300 MG PO CAPS
300.0000 mg | ORAL_CAPSULE | Freq: Three times a day (TID) | ORAL | Status: DC
  Administered 2021-03-04 – 2021-03-05 (×3): 300 mg via ORAL
  Filled 2021-03-04 (×3): qty 1

## 2021-03-04 MED ORDER — CARVEDILOL 3.125 MG PO TABS
12.5000 mg | ORAL_TABLET | Freq: Two times a day (BID) | ORAL | Status: DC
  Administered 2021-03-04 (×2): 12.5 mg via ORAL
  Filled 2021-03-04: qty 1
  Filled 2021-03-04: qty 4

## 2021-03-04 MED ORDER — SODIUM CHLORIDE 0.45 % IV SOLN: INTRAVENOUS | Status: DC

## 2021-03-04 MED ORDER — KETAMINE HCL 50 MG/5ML IJ SOSY
15.0000 mg | PREFILLED_SYRINGE | Freq: Once | INTRAMUSCULAR | Status: AC
  Administered 2021-03-04: 15 mg via INTRAVENOUS
  Filled 2021-03-04: qty 5

## 2021-03-04 MED ORDER — ONDANSETRON HCL 4 MG/2ML IJ SOLN
4.0000 mg | INTRAMUSCULAR | Status: AC
  Administered 2021-03-04: 4 mg via INTRAVENOUS

## 2021-03-04 MED ORDER — DIATRIZOATE MEGLUMINE & SODIUM 66-10 % PO SOLN
90.0000 mL | Freq: Once | ORAL | Status: AC
  Administered 2021-03-04: 90 mL via NASOGASTRIC
  Filled 2021-03-04: qty 90

## 2021-03-04 MED ORDER — FENTANYL CITRATE PF 50 MCG/ML IJ SOSY
50.0000 ug | PREFILLED_SYRINGE | Freq: Once | INTRAMUSCULAR | Status: DC
Start: 2021-03-04 — End: 2021-03-04
  Filled 2021-03-04: qty 1

## 2021-03-04 MED ORDER — IRBESARTAN 150 MG PO TABS
150.0000 mg | ORAL_TABLET | Freq: Every day | ORAL | Status: DC
  Administered 2021-03-04: 150 mg via ORAL
  Filled 2021-03-04: qty 1

## 2021-03-04 MED ORDER — ALLOPURINOL 100 MG PO TABS
100.0000 mg | ORAL_TABLET | Freq: Every day | ORAL | Status: DC
  Administered 2021-03-04: 100 mg via ORAL
  Filled 2021-03-04: qty 1

## 2021-03-04 MED ORDER — SODIUM CHLORIDE 0.9 % IV SOLN
2.0000 g | INTRAVENOUS | Status: AC
  Administered 2021-03-05: 2 g via INTRAVENOUS
  Filled 2021-03-04: qty 2

## 2021-03-04 MED ORDER — CHLORHEXIDINE GLUCONATE CLOTH 2 % EX PADS
6.0000 | MEDICATED_PAD | Freq: Once | CUTANEOUS | Status: AC
  Administered 2021-03-05: 6 via TOPICAL

## 2021-03-04 MED ORDER — COLCHICINE 0.6 MG PO TABS
0.6000 mg | ORAL_TABLET | Freq: Every day | ORAL | Status: DC | PRN
  Filled 2021-03-04: qty 1

## 2021-03-04 MED ORDER — AMLODIPINE BESYLATE 5 MG PO TABS
10.0000 mg | ORAL_TABLET | Freq: Every day | ORAL | Status: DC
  Administered 2021-03-04: 10 mg via ORAL
  Filled 2021-03-04: qty 2

## 2021-03-04 MED ORDER — ASPIRIN EC 81 MG PO TBEC
81.0000 mg | DELAYED_RELEASE_TABLET | Freq: Every day | ORAL | Status: DC
  Administered 2021-03-04: 81 mg via ORAL
  Filled 2021-03-04: qty 1

## 2021-03-04 MED ORDER — CHLORHEXIDINE GLUCONATE CLOTH 2 % EX PADS
6.0000 | MEDICATED_PAD | Freq: Once | CUTANEOUS | Status: AC
  Administered 2021-03-04: 6 via TOPICAL

## 2021-03-04 MED ORDER — TAMSULOSIN HCL 0.4 MG PO CAPS: 0.4000 mg | ORAL_CAPSULE | Freq: Every day | ORAL | Status: DC

## 2021-03-04 MED ORDER — ACETAMINOPHEN 325 MG PO TABS: 650.0000 mg | ORAL_TABLET | Freq: Four times a day (QID) | ORAL | Status: DC | PRN

## 2021-03-04 MED ORDER — MORPHINE SULFATE (PF) 4 MG/ML IV SOLN
4.0000 mg | Freq: Once | INTRAVENOUS | Status: AC
Start: 2021-03-04 — End: 2021-03-04
  Administered 2021-03-04: 4 mg via INTRAVENOUS
  Filled 2021-03-04: qty 1

## 2021-03-04 MED ORDER — ONDANSETRON 4 MG PO TBDP
4.0000 mg | ORAL_TABLET | Freq: Once | ORAL | Status: AC
  Administered 2021-03-04: 4 mg via ORAL
  Filled 2021-03-04: qty 1

## 2021-03-04 MED ORDER — FENTANYL CITRATE PF 50 MCG/ML IJ SOSY
50.0000 ug | PREFILLED_SYRINGE | Freq: Once | INTRAMUSCULAR | Status: AC
  Administered 2021-03-04: 50 ug via INTRAVENOUS
  Filled 2021-03-04: qty 1

## 2021-03-04 NOTE — ED Notes (Signed)
Girlfriend Marcelino Duster called and update given with permission of patient.

## 2021-03-04 NOTE — ED Notes (Signed)
States he is feeling better at present

## 2021-03-04 NOTE — Progress Notes (Signed)
Irrigated NGT and checked suction. Not much coming out of the tube. Ordered stat Abd xray to check placement of the NGT and explained this to the family member at the bedside.

## 2021-03-04 NOTE — ED Provider Notes (Signed)
[facility] EMERGENCY DEPARTMENT Provider Note   CSN: [id number] Arrival date & time: 03/04/21  0251     History Chief Complaint  Patient presents with   Abdominal Pain    Daniel Perkins is a 65 y.o. male.  65 yo M with a chief complaints of abdominal pain nausea and vomiting.  This started yesterday evening.  Patient has had about 8 episodes of emesis since then.  Denies prior abdominal surgeries denies fevers or chills.  The history is provided by the patient.  Abdominal Pain Pain location:  Generalized Pain quality: aching and cramping   Pain radiates to:  Does not radiate Pain severity:  Moderate Onset quality:  Sudden Duration:  12 hours Timing:  Constant Progression:  Worsening Chronicity:  New Relieved by:  Nothing Worsened by:  Nothing Ineffective treatments:  None tried Associated symptoms: nausea and vomiting   Associated symptoms: no chest pain, no chills, no diarrhea, no fever and no shortness of breath       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   Hepatic steatosis 05/01/2019   Prostate hypertrophy 12/06/2018   Sleep apnea-like behavior 10/01/2014   Peripheral vascular disease (HCC) 02/10/2014   Microcytic anemia 02/06/2013   CTEV (congenital talipes equinovarus)    Statin intolerance 06/29/2012   Esophageal reflux 06/29/2012   Health care maintenance  06/28/2012   Gout 06/12/2012   Obesity, Class II, BMI 35-39.9 02/20/2012   DOE (dyspnea on exertion) 02/20/2012   Uncontrolled diabetes mellitus with complications 09/06/2009   ERECTILE DYSFUNCTION, NON-ORGANIC 02/16/2009   Hyperlipemia 01/14/2009   HYPERTENSION 01/14/2009    Past Surgical History:  Procedure Laterality Date   ANTERIOR CERVICAL DECOMP/DISCECTOMY FUSION  X 2   CARDIAC CATHETERIZATION     FOOT FRACTURE SURGERY Right 1990's   "pt a steel plate in"   FOOT SURGERY Left x16   "joints collapsed; keep getting infected"   POSTERIOR CERVICAL FUSION/FORAMINOTOMY  X 2       Family History  Problem Relation Age of Onset   Diabetes Mother    Coronary artery disease Mother        HAS PACEMAKER   Heart disease Mother    Heart attack Brother    Lung disease Neg Hx     Social History   Tobacco Use   Smoking status: Former    Packs/day: 1.00    Years: 32.00    Pack years: 32.00    Types: Cigarettes    Quit date: 07/06/2007    Years since quitting: 13.6   Smokeless tobacco: Never  Vaping Use   Vaping Use: Never used  Substance Use Topics   Alcohol use: Yes    Alcohol/week: 0.0 standard drinks    Comment: Sometimes beer.   Drug use: No    Home  Medications Prior to Admission medications   Medication Sig Start Date End Date Taking? Authorizing Provider  Accu-Chek Softclix Lancets lancets Check once a day 10/11/19   Claudean Severance, MD  albuterol [facility] HFA) [phone] Base) MCG/ACT inhaler Inhale 1-2 puffs into the lungs every 6 (six) hours as needed for wheezing or shortness of breath. Patient not taking: Reported on 02/10/2021 02/13/20   Eliezer Bottom, MD  allopurinol (ZYLOPRIM) 100 MG tablet Take 1 tablet (100 mg total) by mouth daily. 06/22/20   Albertha Ghee, MD  amLODipine (NORVASC) 10 MG tablet Take 1 tablet (10 mg total) by mouth daily. 05/19/20   Steffanie Rainwater, MD  amoxicillin-clavulanate (AUGMENTIN) 875-125 MG tablet Take 1 tablet by mouth every 12  (twelve) hours. Patient not taking: Reported on 02/10/2021 12/18/20   Raspet, Noberto Retort, PA-C  aspirin EC 81 MG tablet Take 1 tablet (81 mg total) by mouth daily. 03/19/15   Deneise Lever, MD  carvedilol (COREG) 12.5 MG tablet Take 12.5 mg by mouth 2 (two) times daily. 11/30/20   [provider]  colchicine 0.6 MG tablet TAKE 1 TABLET BY MOUTH  DAILY AS NEEDED FOR GOUT  FLARE 06/22/20   Albertha Ghee, MD  gabapentin (NEURONTIN) 300 MG capsule Take 300 mg by mouth 3 (three) times daily. 12/18/20   [provider]  glipiZIDE (GLUCOTROL) 10 MG tablet TAKE 1 TABLET BY MOUTH  TWICE A DAY BEFORE A MEAL 07/06/20   Verdene Lennert, MD  glucose blood (ACCU-CHEK GUIDE) test strip Check once a day 10/11/19   Claudean Severance, MD  Insulin Pen Needle 32G X 4 MM MISC Use to inject victoza one time a day 11/15/17   Tyson Alias, MD  JARDIANCE 10 MG TABS tablet Take 10 mg by mouth every morning. 11/29/20   [provider]  liraglutide (VICTOZA) 18 MG/3ML SOPN Inject 1.8 mg into the skin daily as instructed 07/06/20   Verdene Lennert, MD  naproxen (NAPROSYN) 500 MG tablet Take 1 tablet (500 mg total) by mouth 2 (two) times daily. Patient not taking: Reported on 02/10/2021 12/19/20   Dietrich Pates, PA-C  nitroGLYCERIN (NITROSTAT) 0.4 MG SL tablet DISSOLVE 1 TABLET UNDER THE TONGUE EVERY 5 MINUTES AS  NEEDED FOR CHEST PAIN , MAX 3 TABLETS IN 15 MINUTES,  CALL 911 IF NO RELIEF. 04/07/20   Katsadouros, Vasilios, MD  rosuvastatin (CRESTOR) 10 MG tablet Take 1 tablet (10 mg total) by mouth daily. 02/10/21   Corky Crafts, MD  tamsulosin (FLOMAX) 0.4 MG CAPS capsule Take 1 capsule (0.4 mg total) by mouth daily. 11/26/19   Eliezer Bottom, MD  valsartan (DIOVAN) 160 MG tablet Take 160 mg by mouth daily.    [provider]  valsartan (DIOVAN) 80 MG tablet Take 1 tablet (80 mg total) by mouth daily. 07/06/20 10/04/20  Verdene Lennert, MD    Allergies    Morphine and Pravastatin  sodium  Review of Systems   Review of Systems  Constitutional:  Negative for chills and fever.  HENT:  Negative for congestion and facial swelling.   Eyes:  Negative for discharge and visual disturbance.  Respiratory:  Negative for shortness of breath.   Cardiovascular:  Negative for chest pain and palpitations.  Gastrointestinal:  Positive for abdominal pain, nausea and vomiting. Negative for diarrhea.  Musculoskeletal:  Negative for arthralgias and myalgias.  Skin:  Negative for color change and rash.  Neurological:  Negative for tremors, syncope and headaches.  Psychiatric/Behavioral:  Negative for  confusion and dysphoric mood.    Physical Exam Updated Vital Signs BP 140/90 (BP Location: Left Arm)   Pulse 90   Temp 98.1 F (36.7 C) (Oral)   Resp 15   Ht 5\' 7"  (1.702 m)   Wt 94.8 kg   SpO2 96%   BMI 32.73 kg/m   Physical Exam Vitals and nursing note reviewed.  Constitutional:      Appearance: He is well-developed.  HENT:     Head: Normocephalic and atraumatic.  Eyes:     Pupils: Pupils are equal, round, and reactive to light.  Neck:     Vascular: No JVD.  Cardiovascular:     Rate and Rhythm: Normal rate and regular rhythm.     Heart sounds: No murmur heard.   No friction rub. No gallop.  Pulmonary:     Effort: No respiratory distress.     Breath sounds: No wheezing.  Abdominal:     General: There is distension (tympanitic on percussion).     Tenderness: There is no abdominal tenderness. There is no guarding or rebound.  Musculoskeletal:        General: Normal range of motion.     Cervical back: Normal range of motion and neck supple.  Skin:    Coloration: Skin is not pale.     Findings: No rash.  Neurological:     Mental Status: He is alert and oriented to person, place, and time.  Psychiatric:        Behavior: Behavior normal.    ED Results / Procedures / Treatments   Labs (all labs ordered are listed, but only abnormal results are displayed) Labs  Reviewed  CBC WITH DIFFERENTIAL/PLATELET - Abnormal; Notable for the following components:      Result Value   MCV 72.4 (*)    MCH 22.7 (*)    RDW 16.5 (*)    All other components within normal limits  COMPREHENSIVE METABOLIC PANEL - Abnormal; Notable for the following components:   Sodium 131 (*)    CO2 20 (*)    Glucose, Bld 168 (*)    All other components within normal limits  URINALYSIS, ROUTINE W REFLEX MICROSCOPIC - Abnormal; Notable for the following components:   Glucose, UA >=500 (*)    Ketones, ur 20 (*)    All other components within normal limits  LACTIC ACID, PLASMA  LACTIC ACID, PLASMA    EKG None  Radiology CT ABDOMEN PELVIS W CONTRAST  Result Date: 03/04/2021 CLINICAL DATA:  Abdominal pain. EXAM: CT ABDOMEN AND PELVIS WITH CONTRAST TECHNIQUE: Multidetector CT imaging of the abdomen and pelvis was performed using the standard protocol following bolus administration of intravenous contrast. CONTRAST:  03/06/2021 OMNIPAQUE IOHEXOL 300 MG/ML  SOLN COMPARISON:  None. FINDINGS: Lower chest: No acute abnormality. Hepatobiliary: There is diffuse fatty infiltration of the liver parenchyma. No focal liver abnormality is seen. No gallstones, gallbladder wall thickening, or biliary dilatation. Pancreas: Unremarkable. No pancreatic ductal dilatation or surrounding inflammatory changes. Spleen: Normal in size without focal abnormality. Adrenals/Urinary Tract: Adrenal glands are unremarkable. Kidneys are normal, without renal calculi, focal lesion, or hydronephrosis. Bladder is unremarkable. Stomach/Bowel: There is a small hiatal hernia. Appendix appears normal. Multiple dilated small bowel loops are seen within the mid and lower left abdomen. A transition zone is suspected within the pelvis on the left (axial CT images 57 through 62, CT series 3). Vascular/Lymphatic: Aortic atherosclerosis. No enlarged abdominal or pelvic lymph nodes. Reproductive: Prostate is unremarkable. Other: No  abdominal  wall hernia or abnormality. No abdominopelvic ascites. Musculoskeletal: No acute or significant osseous findings. IMPRESSION: 1. Small-bowel obstruction with a transition zone noted within the pelvis on the left. 2. Hepatic steatosis. 3. Aortic atherosclerosis. Aortic Atherosclerosis (ICD10-I70.0). Electronically Signed   By: Aram Candela M.D.   On: 03/04/2021 04:04    Procedures Procedures   Medications Ordered in ED Medications  morphine 4 MG/ML injection 4 mg (has no administration in time range)  ondansetron (ZOFRAN) injection 4 mg (has no administration in time range)  ketamine 50 mg in normal saline 5 mL (10 mg/mL) syringe (has no administration in time range)  ondansetron (ZOFRAN-ODT) disintegrating tablet 4 mg (4 mg Oral Given 03/04/21 0318)  fentaNYL (SUBLIMAZE) injection 50 mcg (50 mcg Intravenous Given 03/04/21 0318)  iohexol (OMNIPAQUE) 300 MG/ML solution 100 mL (100 mLs Intravenous Contrast Given 03/04/21 0357)    ED Course  I have reviewed the triage vital signs and the nursing notes.  Pertinent labs & imaging results that were available during my care of the patient were reviewed by me and considered in my medical decision making (see chart for details).    MDM Rules/Calculators/A&P                           65 yO M with a chief complaints of abdominal pain nausea and vomiting.  The patient has had about 8 episodes of emesis since onset of symptoms.  He denies fevers or chills.  Denies history of the same.  CT scan concerning for small bowel obstruction with transition point.  Mild signs of dehydration on his blood work.  No significant anemia.  Patient denies any history of obstruction denies history of abdominal surgery. Denies hx of cancer.  Will discuss with general surgery.  I discussed the case with Dr. Derrell Lolling recommended placing an NG tube and admitting to medicine.  The patients results and plan were reviewed and discussed.   Any x-rays performed  were independently reviewed by myself.   Differential diagnosis were considered with the presenting HPI.  Medications  morphine 4 MG/ML injection 4 mg (has no administration in time range)  ondansetron (ZOFRAN) injection 4 mg (has no administration in time range)  ketamine 50 mg in normal saline 5 mL (10 mg/mL) syringe (has no administration in time range)  ondansetron (ZOFRAN-ODT) disintegrating tablet 4 mg (4 mg Oral Given 03/04/21 0318)  fentaNYL (SUBLIMAZE) injection 50 mcg (50 mcg Intravenous Given 03/04/21 0318)  iohexol (OMNIPAQUE) 300 MG/ML solution 100 mL (100 mLs Intravenous Contrast Given 03/04/21 0357)    Vitals:   03/04/21 0255 03/04/21 0256 03/04/21 0426  BP: 137/90  140/90  Pulse: 88  90  Resp: 16  15  Temp: 98.1 F (36.7 C)    TempSrc: Oral    SpO2: 98%  96%  Weight:  94.8 kg   Height:  5\' 7"  (1.702 m)     Final diagnoses:  SBO (small bowel obstruction) (HCC)    Admission/ observation were discussed with the admitting physician, patient and/or family and they are comfortable with the plan.   Final Clinical Impression(s) / ED Diagnoses Final diagnoses:  SBO (small bowel obstruction) [facility])    Rx / DC Orders ED Discharge Orders     None        [facility], DO 03/04/21 [phone]

## 2021-03-04 NOTE — ED Notes (Signed)
Attempted report x1. 

## 2021-03-04 NOTE — ED Notes (Signed)
Family updated as to patient's status.

## 2021-03-04 NOTE — H&P (Signed)
History and Physical    Daniel Perkins VOZ:[id number] DOB: Aug 19, 1955 DOA: 03/04/2021  PCP: Ellyn Hack, MD  Patient coming from: Home  I have personally briefly reviewed patient's old medical records in [facility]  Chief Complaint: N/V  HPI: Daniel Perkins is a 65 y.o. male with medical history significant of CAD/MI, gout, CKD II, HTN, DM presents with 24 h/o N/V with multiple episodes of emesis. He has had no abdominal surgery. He had been otherwise in his usual state of health. Due to symptoms he presents eo [facility] for evaluation.   ED Course: T 98.1  140/90  HR 90  RR 15. Lab reveals glucose f 169, Lactic acid 1.4, WBC 9.2. CT abdomen with dilated loops of small bowel with transition in the pelvis c/w SBO. Dr. Derrell Lolling for GS consulted recommending NG placed and admission to [facility]. Surgery will see in consultation.   Review of Systems: As per HPI otherwise 10 point review of systems negative.    Past Medical History:  Diagnosis Date   Club foot of both lower extremities 1958   Coronary artery disease    Holter monitor 02/2012 - sinus with rare PVC   CTEV (congenital talipes equinovarus)    Gout    Hyperlipidemia    Hypertension    Myocardial infarction [facility]) ~ 2007   "mild"   Osteomyelitis (HCC)    Pneumonia 2000's   "couple times"   Pneumonia due to COVID-19 virus 04/28/2019   Type II diabetes mellitus (HCC) 2011    Past Surgical History:  Procedure Laterality Date   ANTERIOR CERVICAL DECOMP/DISCECTOMY FUSION  X 2   CARDIAC CATHETERIZATION     FOOT FRACTURE SURGERY Right 1990's   "pt a steel plate in"   FOOT SURGERY Left x16   "joints collapsed; keep getting infected"   POSTERIOR CERVICAL FUSION/FORAMINOTOMY  X 2   Soc Hx - Widowed after 20 years of marriage. He has two sons, one daughter. 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    reports that he quit smoking about 13 years ago. His  smoking use included cigarettes. He has a 32.00 pack-year smoking history. He has never used smokeless tobacco. He reports current alcohol use. He reports that he does not use drugs.  Allergies  Allergen Reactions   Morphine Itching   Pravastatin Sodium Other (See Comments)    Severe muscle cramps with one time requiring admission.     Family History  Problem Relation Age of Onset   Diabetes Mother    Coronary artery disease Mother        HAS PACEMAKER   Heart disease Mother    Heart attack Brother    Lung disease Neg Hx     Prior to Admission medications   Medication Sig Start Date End Date Taking? Authorizing Provider  allopurinol (ZYLOPRIM) 100 MG tablet Take 1 tablet (100 mg total) by mouth daily. 06/22/20  Yes Albertha Ghee, MD  amLODipine (NORVASC) 10 MG tablet Take 1 tablet (10 mg total) by mouth daily. 05/19/20  Yes Steffanie Rainwater, MD  aspirin EC 81 MG tablet Take 1 tablet (81 mg total) by mouth daily. 03/19/15  Yes Deneise Lever, MD  carvedilol (COREG) 12.5 MG tablet Take 12.5 mg by mouth 2 (two) times daily. 11/30/20  Yes [provider]  colchicine 0.6 MG tablet TAKE 1 TABLET BY MOUTH  DAILY AS NEEDED FOR GOUT  FLARE Patient taking differently:  Take 0.6 mg by mouth daily as needed (gout flares). 06/22/20  Yes Albertha Ghee, MD  gabapentin (NEURONTIN) 300 MG capsule Take 300 mg by mouth 3 (three) times daily. 12/18/20  Yes [provider]  glipiZIDE (GLUCOTROL) 10 MG tablet TAKE 1 TABLET BY MOUTH  TWICE A DAY BEFORE A MEAL Patient taking differently: Take 10 mg by mouth 2 (two) times daily before a meal. 07/06/20  Yes Verdene Lennert, MD  Insulin Pen Needle 32G X 4 MM MISC Use to inject victoza one time a day 11/15/17  Yes Tyson Alias, MD  JARDIANCE 10 MG TABS tablet Take 10 mg by mouth every morning. 11/29/20  Yes [provider]  liraglutide (VICTOZA) 18 MG/3ML SOPN Inject 1.8 mg into the skin daily as instructed Patient taking  differently: Inject 1.8 mg into the skin daily. 07/06/20  Yes Verdene Lennert, MD  nitroGLYCERIN (NITROSTAT) 0.4 MG SL tablet DISSOLVE 1 TABLET UNDER THE TONGUE EVERY 5 MINUTES AS  NEEDED FOR CHEST PAIN , MAX 3 TABLETS IN 15 MINUTES,  CALL 911 IF NO RELIEF. Patient taking differently: Place 0.4 mg under the tongue every 5 (five) minutes as needed for chest pain. 04/07/20  Yes Katsadouros, Vasilios, MD  rosuvastatin (CRESTOR) 5 MG tablet Take 5 mg by mouth at bedtime.   Yes [provider]  tamsulosin (FLOMAX) 0.4 MG CAPS capsule Take 1 capsule (0.4 mg total) by mouth daily. Patient taking differently: Take 0.4 mg by mouth at bedtime. 11/26/19  Yes Aslam, Leanna Sato, MD  traMADol (ULTRAM) 50 MG tablet Take 50 mg by mouth daily as needed. 02/15/21  Yes [provider]  valsartan (DIOVAN) 160 MG tablet Take 160 mg by mouth daily.   Yes [provider]  Accu-Chek Softclix Lancets lancets Check once a day Patient not taking: Reported on 03/04/2021 10/11/19   Claudean Severance, MD  albuterol [facility] HFA) 108 (90 Base) MCG/ACT inhaler Inhale 1-2 puffs into the lungs every 6 (six) hours as needed for wheezing or shortness of breath. Patient not taking: No sig reported 02/13/20   Eliezer Bottom, MD  amoxicillin-clavulanate (AUGMENTIN) 875-125 MG tablet Take 1 tablet by mouth every 12 (twelve) hours. Patient not taking: No sig reported 12/18/20   Raspet, Erin K, PA-C  glucose blood (ACCU-CHEK GUIDE) test strip Check once a day Patient not taking: Reported on 03/04/2021 10/11/19   Claudean Severance, MD  naproxen (NAPROSYN) 500 MG tablet Take 1 tablet (500 mg total) by mouth 2 (two) times daily. Patient not taking: No sig reported 12/19/20   Khatri, Hina, PA-C  rosuvastatin (CRESTOR) 10 MG tablet Take 1 tablet (10 mg total) by mouth daily. Patient not taking: No sig reported 02/10/21   Corky Crafts, MD  valsartan (DIOVAN) 80 MG tablet Take 1 tablet (80 mg total) by mouth daily. Patient  not taking: Reported on 03/04/2021 07/06/20 10/04/20  Verdene Lennert, MD    Physical Exam: Vitals:   03/04/21 0615 03/04/21 0630 03/04/21 0645 03/04/21 0700  BP: 123/76 124/85 126/77 127/82  Pulse: 89 84 82 83  Resp: 14 16 (!) 24 15  Temp:      TempSrc:      SpO2: 93% 91% 94% 95%  Weight:      Height:         Vitals:   03/04/21 0615 03/04/21 0630 03/04/21 0645 03/04/21 0700  BP: 123/76 124/85 126/77 127/82  Pulse: 89 84 82 83  Resp: 14 16 (!) 24 15  Temp:  TempSrc:      SpO2: 93% 91% 94% 95%  Weight:      Height:       General: Very pleasant overweight man in no acute distress at time of exam Eyes: PERRL, lids and conjunctivae normal ENMT: Mucous membranes are moist. Posterior pharynx clear of any exudate or lesions.Normal dentition.  Neck: normal, supple, no masses, no thyromegaly Respiratory: clear to auscultation bilaterally, no wheezing, no crackles. Normal respiratory effort. No accessory muscle use.  Cardiovascular: Regular rate and rhythm, no murmurs / rubs / gallops. No extremity edema. Trace pedal pulses. No carotid bruits.  Abdomen: Obese, absent BS, no guarding or rebound. NO point tenderness on exam. Musculoskeletal: Marked scarring left foot after foot surgery for club foot. RIght foot with surgical scars. . No joint deformity upper and lower extremities. Good ROM, no contractures. Normal muscle tone.  Skin: no rashes, lesions, ulcers. No induration Neurologic: CN 2-12 grossly intact. Sensation intact. Strength 5/5 in all 4.  Psychiatric: Normal judgment and insight. Alert and oriented x 3. Normal mood.     Labs on Admission: I have personally reviewed following labs and imaging studies  CBC: Recent Labs  Lab 03/04/21 0313  WBC 9.2  NEUTROABS 6.6  HGB 13.0  HCT 41.5  MCV 72.4*  PLT 347   Basic Metabolic Panel: Recent Labs  Lab 03/04/21 0313  NA 131*  K 4.1  CL 101  CO2 20*  GLUCOSE 168*  BUN 20  CREATININE 1.19  CALCIUM 9.5    GFR: Estimated Creatinine Clearance: 68.8 mL/min (by C-G formula based on SCr of 1.19 mg/dL). Liver Function Tests: Recent Labs  Lab 03/04/21 0313  AST 20  ALT 14  ALKPHOS 93  BILITOT 1.0  PROT 8.0  ALBUMIN 3.5   No results for input(s): LIPASE, AMYLASE in the last 168 hours. No results for input(s): AMMONIA in the last 168 hours. Coagulation Profile: No results for input(s): INR, PROTIME in the last 168 hours. Cardiac Enzymes: No results for input(s): CKTOTAL, CKMB, CKMBINDEX, TROPONINI in the last 168 hours. BNP (last 3 results) No results for input(s): PROBNP in the last 8760 hours. HbA1C: No results for input(s): HGBA1C in the last 72 hours. CBG: No results for input(s): GLUCAP in the last 168 hours. Lipid Profile: No results for input(s): CHOL, HDL, LDLCALC, TRIG, CHOLHDL, LDLDIRECT in the last 72 hours. Thyroid Function Tests: No results for input(s): TSH, T4TOTAL, FREET4, T3FREE, THYROIDAB in the last 72 hours. Anemia Panel: No results for input(s): VITAMINB12, FOLATE, FERRITIN, TIBC, IRON, RETICCTPCT in the last 72 hours. Urine analysis:    Component Value Date/Time   COLORURINE YELLOW 03/04/2021 0300   APPEARANCEUR CLEAR 03/04/2021 0300   APPEARANCEUR Clear 01/12/2016 1634   LABSPEC 1.020 03/04/2021 0300   PHURINE 5.0 03/04/2021 0300   GLUCOSEU >=500 (A) 03/04/2021 0300   HGBUR NEGATIVE 03/04/2021 0300   BILIRUBINUR NEGATIVE 03/04/2021 0300   BILIRUBINUR negative 12/06/2018 1659   BILIRUBINUR Negative 01/12/2016 1634   KETONESUR 20 (A) 03/04/2021 0300   PROTEINUR NEGATIVE 03/04/2021 0300   UROBILINOGEN 2.0 (A) 12/06/2018 1659   UROBILINOGEN 1 10/23/2013 1555   NITRITE NEGATIVE 03/04/2021 0300   LEUKOCYTESUR NEGATIVE 03/04/2021 0300    Radiological Exams on Admission: CT ABDOMEN PELVIS W CONTRAST  Result Date: 03/04/2021 CLINICAL DATA:  Abdominal pain. EXAM: CT ABDOMEN AND PELVIS WITH CONTRAST TECHNIQUE: Multidetector CT imaging of the abdomen and  pelvis was performed using the standard protocol following bolus administration of intravenous contrast. CONTRAST:  OMNIPAQUE IOHEXOL 300 MG/ML  SOLN COMPARISON:  None. FINDINGS: Lower chest: No acute abnormality. Hepatobiliary: There is diffuse fatty infiltration of the liver parenchyma. No focal liver abnormality is seen. No gallstones, gallbladder wall thickening, or biliary dilatation. Pancreas: Unremarkable. No pancreatic ductal dilatation or surrounding inflammatory changes. Spleen: Normal in size without focal abnormality. Adrenals/Urinary Tract: Adrenal glands are unremarkable. Kidneys are normal, without renal calculi, focal lesion, or hydronephrosis. Bladder is unremarkable. Stomach/Bowel: There is a small hiatal hernia. Appendix appears normal. Multiple dilated small bowel loops are seen within the mid and lower left abdomen. A transition zone is suspected within the pelvis on the left (axial CT images 57 through 62, CT series 3). Vascular/Lymphatic: Aortic atherosclerosis. No enlarged abdominal or pelvic lymph nodes. Reproductive: Prostate is unremarkable. Other: No abdominal wall hernia or abnormality. No abdominopelvic ascites. Musculoskeletal: No acute or significant osseous findings. IMPRESSION: 1. Small-bowel obstruction with a transition zone noted within the pelvis on the left. 2. Hepatic steatosis. 3. Aortic atherosclerosis. Aortic Atherosclerosis (ICD10-I70.0). Electronically Signed   By: Aram Candela M.D.   On: 03/04/2021 04:04    EKG: Independently reviewed. No EKG on chart  Assessment/Plan Active Problems:   Diabetes mellitus due to underlying condition, uncontrolled, with hyperglycemia (HCC)   Hyperlipemia   HYPERTENSION   Gout   SBO (small bowel obstruction) (HCC)  SBO - patient with N/V and SBO by CT scan with transition in pelvis. NO history of prior surgery or GI issues. Currently comfortable Plan NG to low suction for comfort  NPO except sips with  meds  Gastrograffin 100 ml per NG  F/u KUB   GS consult  2. DM - reports he has been doing well Plan A1C  Continue Victoza, hold other meds  IVF- 1/2 NS  SS coverage  3. HTN - continue home meds  4. Gout - stable w/o painful joints, no tophi Plan Continue home meds  5. HLD - Ca scoring CT - 72nd percentile Plan Continue statin therapy  NO; PATIENT INTOLERANT OF KETAMINE  DVT prophylaxis: heparin SQ  Code Status: full code  Family Communication: Roosvelt Harps - sister listed as emergency contact - no answer Disposition Plan: home when stable  Consults called: GS - Dr. Derrell Lolling consulted by EDP Admission status: inpatient    Illene Regulus MD [facility] Pager [phone]  If 7PM-7AM, please contact night-coverage [email or web address] Password [facility]  03/04/2021, 7:24 AM

## 2021-03-04 NOTE — Progress Notes (Signed)
Pt roomed to unit at 1810hrs, immediately vomited about 100cc of yellow, bilious material. Very restless and uncomfortable, NGT in place and appears to be in correct placement as suction canister has some yellow drainage as well from the suction.

## 2021-03-04 NOTE — ED Notes (Signed)
Pt actively vomiting. yellow fluid. Provider made aware. Awaiting orders

## 2021-03-04 NOTE — ED Triage Notes (Signed)
Pt BIB [facility] from home c/o generalized abdominal pain onset after having dinner with family. Emesis x8. Nausea continues. No GI history.

## 2021-03-04 NOTE — Consult Note (Addendum)
Cardiology Consultation:   Patient ID: Daniel Perkins MRN: [id number]; DOB: 29-Oct-1955  Admit date: 03/04/2021 Date of Consult: 03/04/2021  PCP:  Daniel Hack, MD   [facility] Providers Cardiologist:  Daniel Muss, MD   Patient Profile:   Daniel Perkins is a 65 y.o. male with a hx of nonobstructive CAD, PAD, HTN, HLD, and statin intolerance who is being seen 03/04/2021 for the evaluation of preop evaluation at the request of Dr. Loney Perkins.  History of Present Illness:   [name] has been followed by Dr. Kirke Perkins for PAD and Dr. Kirke Perkins with DOE. He has a history of a normal stress myoview in 2012. Due to ongoing symptoms, he underwent CT coronary which showed calcium score of 5 which was the 58th percentile with less than 30% proximal LAD disease and less than 30% mid Cx disease. He has a repeat nuclear stress test in 04/2017 that was low risk. Echo in 2020 showed LVEF 60-65% with mild DD and no significant valvular disease.   In 02/2021 he had another CCTA for ongoing symptoms that showed an increased calcium score of 67.9 (72nd percentile). Due to heart rate, a full CCTA was not completed, but imaging showed mild coronary calcification.   He presented to [facility] with generalized abdominal pain and emesis x 8 after dinner. CT abdomen concerning for SBO. No history of abdomina surgery. General surgery was consulted and will start with NG tube for decompression. Cardiology was asked for preoperative clearance should he need surgery.   During my interview, he describes exertional dyspnea for "years." He quit smoking about 12 years ago. He does not describe chest pain. He is somewhat limited by his feet, but can walk the aisles of grocery store and can do moderate house work (4.0 METS). No palpitations, orthopnea, PND, or syncope.   DOE is described as intermittent. At times he struggles with this for hours to weeks, and then will not have another episode for 2 months. DOE  can occur with walking down stairs or with walking to another room in his house. He is not SOB at rest.    Past Medical History:  Diagnosis Date   Club foot of both lower extremities 1958   Coronary artery disease    Holter monitor 02/2012 - sinus with rare PVC   CTEV (congenital talipes equinovarus)    Gout    Hyperlipidemia    Hypertension    Myocardial infarction [facility]) ~ 2007   "mild"   Osteomyelitis (HCC)    Pneumonia 2000's   "couple times"   Pneumonia due to COVID-19 virus 04/28/2019   Type II diabetes mellitus (HCC) 2011    Past Surgical History:  Procedure Laterality Date   ANTERIOR CERVICAL DECOMP/DISCECTOMY FUSION  X 2   CARDIAC CATHETERIZATION     FOOT FRACTURE SURGERY Right 1990's   "pt a steel plate in"   FOOT SURGERY Left x16   "joints collapsed; keep getting infected"   POSTERIOR CERVICAL FUSION/FORAMINOTOMY  X 2     Home Medications:  Prior to Admission medications   Medication Sig Start Date End Date Taking? Authorizing Provider  allopurinol (ZYLOPRIM) 100 MG tablet Take 1 tablet (100 mg total) by mouth daily. 06/22/20  Yes Daniel Ghee, MD  amLODipine (NORVASC) 10 MG tablet Take 1 tablet (10 mg total) by mouth daily. 05/19/20  Yes Daniel Rainwater, MD  aspirin EC 81 MG tablet Take 1 tablet (81 mg total) by mouth daily. 03/19/15  Yes Daniel Lever, MD  carvedilol (COREG) 12.5 MG tablet Take 12.5 mg by mouth 2 (two) times daily. 11/30/20  Yes [provider]  colchicine 0.6 MG tablet TAKE 1 TABLET BY MOUTH  DAILY AS NEEDED FOR GOUT  FLARE Patient taking differently: Take 0.6 mg by mouth daily as needed (gout flares). 06/22/20  Yes Daniel Ghee, MD  gabapentin (NEURONTIN) 300 MG capsule Take 300 mg by mouth 3 (three) times daily. 12/18/20  Yes [provider]  glipiZIDE (GLUCOTROL) 10 MG tablet TAKE 1 TABLET BY MOUTH  TWICE A DAY BEFORE A MEAL Patient taking differently: Take 10 mg by mouth 2 (two) times daily before a meal. 07/06/20  Yes  Daniel Lennert, MD  Insulin Pen Needle 32G X 4 MM MISC Use to inject victoza one time a day 11/15/17  Yes Daniel Alias, MD  JARDIANCE 10 MG TABS tablet Take 10 mg by mouth every morning. 11/29/20  Yes [provider]  liraglutide (VICTOZA) 18 MG/3ML SOPN Inject 1.8 mg into the skin daily as instructed Patient taking differently: Inject 1.8 mg into the skin daily. 07/06/20  Yes Daniel Lennert, MD  nitroGLYCERIN (NITROSTAT) 0.4 MG SL tablet DISSOLVE 1 TABLET UNDER THE TONGUE EVERY 5 MINUTES AS  NEEDED FOR CHEST PAIN , MAX 3 TABLETS IN 15 MINUTES,  CALL 911 IF NO RELIEF. Patient taking differently: Place 0.4 mg under the tongue every 5 (five) minutes as needed for chest pain. 04/07/20  Yes Daniel, Vasilios, MD  rosuvastatin (CRESTOR) 5 MG tablet Take 5 mg by mouth at bedtime.   Yes [provider]  tamsulosin (FLOMAX) 0.4 MG CAPS capsule Take 1 capsule (0.4 mg total) by mouth daily. Patient taking differently: Take 0.4 mg by mouth at bedtime. 11/26/19  Yes Daniel, Leanna Sato, MD  traMADol (ULTRAM) 50 MG tablet Take 50 mg by mouth daily as needed. 02/15/21  Yes [provider]  valsartan (DIOVAN) 160 MG tablet Take 160 mg by mouth daily.   Yes [provider]  Accu-Chek Softclix Lancets lancets Check once a day Patient not taking: Reported on 03/04/2021 10/11/19   Daniel Severance, MD  albuterol [facility] HFA) 108 (90 Base) MCG/ACT inhaler Inhale 1-2 puffs into the lungs every 6 (six) hours as needed for wheezing or shortness of breath. Patient not taking: No sig reported 02/13/20   Daniel Bottom, MD  amoxicillin-clavulanate (AUGMENTIN) 875-125 MG tablet Take 1 tablet by mouth every 12 (twelve) hours. Patient not taking: No sig reported 12/18/20   Daniel, Erin K, PA-C  glucose blood (ACCU-CHEK GUIDE) test strip Check once a day Patient not taking: Reported on 03/04/2021 10/11/19   Daniel Severance, MD  naproxen (NAPROSYN) 500 MG tablet Take 1 tablet (500 mg  total) by mouth 2 (two) times daily. Patient not taking: No sig reported 12/19/20   Daniel, Hina, PA-C  rosuvastatin (CRESTOR) 10 MG tablet Take 1 tablet (10 mg total) by mouth daily. Patient not taking: No sig reported 02/10/21   Corky Crafts, MD  valsartan (DIOVAN) 80 MG tablet Take 1 tablet (80 mg total) by mouth daily. Patient not taking: Reported on 03/04/2021 07/06/20 10/04/20  Daniel Lennert, MD    Inpatient Medications: Scheduled Meds:  allopurinol  100 mg Oral Daily   amLODipine  10 mg Oral Daily   aspirin EC  81 mg Oral Daily   carvedilol  12.5 mg Oral BID AC   empagliflozin  10 mg Oral q morning   gabapentin  300 mg Oral TID   heparin  5,000 Units Subcutaneous Q8H   insulin aspart  0-20 Units Subcutaneous TID WC   irbesartan  150 mg Oral Daily   rosuvastatin  5 mg Oral QHS   tamsulosin  0.4 mg Oral QHS   Continuous Infusions:  sodium chloride 75 mL/hr at 03/04/21 0845   PRN Meds: acetaminophen **OR** acetaminophen, colchicine, ketorolac, morphine injection, nitroGLYCERIN, ondansetron (ZOFRAN) IV  Allergies:    Allergies  Allergen Reactions   Morphine Itching   Pravastatin Sodium Other (See Comments)    Severe muscle cramps with one time requiring admission.     Social History:   Social History   Socioeconomic History   Marital status: Legally Separated    Spouse name: Not on file   Number of children: Not on file   Years of education: 13   Highest education level: Not on file  Occupational History    Employer: UNEMPLOYED   Occupation: unemployed  Tobacco Use   Smoking status: Former    Packs/day: 1.00    Years: 32.00    Pack years: 32.00    Types: Cigarettes    Quit date: 07/06/2007    Years since quitting: 13.6   Smokeless tobacco: Never  Vaping Use   Vaping Use: Never used  Substance and Sexual Activity   Alcohol use: Yes    Alcohol/week: 0.0 standard drinks    Comment: Sometimes beer.   Drug use: No   Sexual activity: Not on file   Other Topics Concern   Not on file  Social History Narrative   Not on file   Social Determinants of Health   Financial Resource Strain: Not on file  Food Insecurity: Not on file  Transportation Needs: Not on file  Physical Activity: Not on file  Stress: Not on file  Social Connections: Not on file  Intimate Partner Violence: Not on file    Family History:    Family History  Problem Relation Age of Onset   Diabetes Mother    Coronary artery disease Mother        HAS PACEMAKER   Heart disease Mother    Heart attack Brother    Lung disease Neg Hx      ROS:  Please see the history of present illness.   All other ROS reviewed and negative.     Physical Exam/Data:   Vitals:   03/04/21 1215 03/04/21 1230 03/04/21 1315 03/04/21 1400  BP: (!) 151/86 (!) 154/95 (!) 164/90 (!) 147/90  Pulse: 81 78 82 73  Resp: 16 20 (!) 22 (!) 21  Temp:      TempSrc:      SpO2: 93% 94% 96% 94%  Weight:      Height:        Intake/Output Summary (Last 24 hours) at 03/04/2021 1418 Last data filed at 03/04/2021 1019 Gross per 24 hour  Intake --  Output 500 ml  Net -500 ml   Last 3 Weights 03/04/2021 02/10/2021 12/19/2020  Weight (lbs) 209 lb 209 lb 12.8 oz 218 lb  Weight (kg) 94.802 kg 95.165 kg 98.884 kg     Body mass index is 32.73 kg/m.  General:  Well nourished, well developed, in no acute distress HEENT: normal Neck: no JVD Vascular: No carotid bruits; Distal pulses 2+ bilaterally Cardiac:  normal S1, S2; RRR; no murmur  Lungs:  clear to auscultation bilaterally, no wheezing, rhonchi or rales  Abd: soft, nontender, no hepatomegaly  Ext: no edema Musculoskeletal:  No deformities, BUE and BLE strength normal  and equal Skin: warm and dry  Neuro:  CNs 2-12 intact, no focal abnormalities noted Psych:  Normal affect   EKG:  The EKG was personally reviewed and demonstrates:  NSR HR 89 LAFB Telemetry:  Telemetry was personally reviewed and demonstrates:  sinus I the  80s  Relevant CV Studies:  CT cardiac scoring 02/2021 FINDINGS: Coronary arteries: Normal origins.   Coronary Calcium Score:   Left main: 0   Left anterior descending artery: 38.2   Left circumflex artery: 24.3   Right coronary artery: 5.29   Total: 67.9   Percentile: 72nd   Pericardium: Normal.   Ascending Aorta: Normal caliber. Scattered calcifications in the ascending and descending aorta.   Non-cardiac: See separate report from [facility].   IMPRESSION: Coronary calcium score of 67.9. This was 72nd percentile for age-, race-, and sex-matched controls.    Echo 07/05/18:  1. The left ventricle has normal systolic function with an ejection  fraction of 60-65%. The cavity size was normal. There is moderately  increased left ventricular wall thickness. Left ventricular diastolic  Doppler parameters are consistent with impaired   relaxation Indeterminent filling pressures The E/e' is 8-15.   2. The right ventricle has normal systolic function. The cavity was  normal. There is no increase in right ventricular wall thickness.   3. The mitral valve is degenerative. Mild thickening of the mitral valve  leaflet.   4. The tricuspid valve is normal in structure.   5. The aortic valve is tricuspid Mild calcification of the aortic valve.  Aortic valve regurgitation is trivial by color flow Doppler.   6. The aortic root and ascending aorta are normal in size and structure.   7. The interatrial septum was not well visualized.   8. When compared to the prior study: 08/11/10 EF 65-70%.    Laboratory Data:  High Sensitivity Troponin:  No results for input(s): TROPONINIHS in the last 720 hours.   Chemistry Recent Labs  Lab 03/04/21 0313  NA 131*  Perkins 4.1  CL 101  CO2 20*  GLUCOSE 168*  BUN 20  CREATININE 1.19  CALCIUM 9.5  GFRNONAA >60  ANIONGAP 10    Recent Labs  Lab 03/04/21 0313  PROT 8.0  ALBUMIN 3.5  AST 20  ALT 14  ALKPHOS 93  BILITOT 1.0    Lipids No results for input(s): CHOL, TRIG, HDL, LABVLDL, LDLCALC, CHOLHDL in the last 168 hours.  Hematology Recent Labs  Lab 03/04/21 0313  WBC 9.2  RBC 5.73  HGB 13.0  HCT 41.5  MCV 72.4*  MCH 22.7*  MCHC 31.3  RDW 16.5*  PLT 347   Thyroid No results for input(s): TSH, FREET4 in the last 168 hours.  BNPNo results for input(s): BNP, PROBNP in the last 168 hours.  DDimer No results for input(s): DDIMER in the last 168 hours.   Radiology/Studies:  DG Abd 1 View  Result Date: 03/04/2021 CLINICAL DATA:  Small-bowel obstruction, vomiting EXAM: ABDOMEN - 1 VIEW COMPARISON:  03/04/2021, 10:25 a.m. FINDINGS: Interval administration of enteric contrast, which is present in the gastric fundus and lower esophagus. There is dilute contrast present in distended loops of mid small bowel measuring up to 5.1 cm in caliber. There is a moderate burden of gas and stool throughout the colon to the rectum. No obvious free air. Esophagogastric tube remains with tip and side port below the diaphragm. IMPRESSION: 1. Interval administration of enteric contrast, which is present in the gastric fundus and lower esophagus.  There is dilute contrast present in distended loops of mid small bowel measuring up to 5.1 cm in caliber. Findings remain consistent with small bowel obstruction. 2. There is a moderate burden of gas and stool throughout the colon to the rectum. Electronically Signed   By: Jearld Lesch M.D.   On: 03/04/2021 13:50   CT ABDOMEN PELVIS W CONTRAST  Result Date: 03/04/2021 CLINICAL DATA:  Abdominal pain. EXAM: CT ABDOMEN AND PELVIS WITH CONTRAST TECHNIQUE: Multidetector CT imaging of the abdomen and pelvis was performed using the standard protocol following bolus administration of intravenous contrast. CONTRAST:  OMNIPAQUE IOHEXOL 300 MG/ML  SOLN COMPARISON:  None. FINDINGS: Lower chest: No acute abnormality. Hepatobiliary: There is diffuse fatty infiltration of the liver parenchyma. No  focal liver abnormality is seen. No gallstones, gallbladder wall thickening, or biliary dilatation. Pancreas: Unremarkable. No pancreatic ductal dilatation or surrounding inflammatory changes. Spleen: Normal in size without focal abnormality. Adrenals/Urinary Tract: Adrenal glands are unremarkable. Kidneys are normal, without renal calculi, focal lesion, or hydronephrosis. Bladder is unremarkable. Stomach/Bowel: There is a small hiatal hernia. Appendix appears normal. Multiple dilated small bowel loops are seen within the mid and lower left abdomen. A transition zone is suspected within the pelvis on the left (axial CT images 57 through 62, CT series 3). Vascular/Lymphatic: Aortic atherosclerosis. No enlarged abdominal or pelvic lymph nodes. Reproductive: Prostate is unremarkable. Other: No abdominal wall hernia or abnormality. No abdominopelvic ascites. Musculoskeletal: No acute or significant osseous findings. IMPRESSION: 1. Small-bowel obstruction with a transition zone noted within the pelvis on the left. 2. Hepatic steatosis. 3. Aortic atherosclerosis. Aortic Atherosclerosis (ICD10-I70.0). Electronically Signed   By: Aram Candela M.D.   On: 03/04/2021 04:04   DG Abd Portable 1V-Small Bowel Protocol-Position Verification  Result Date: 03/04/2021 CLINICAL DATA:  NG tube placement EXAM: PORTABLE ABDOMEN - 1 VIEW COMPARISON:  Same day CT. FINDINGS: There is a nasogastric tube with tip and side port overlying the stomach. Multiple dilated loops of small bowel in the upper abdomen. IMPRESSION: Nasogastric tube tip and side port overlie the stomach. Small bowel obstruction. Electronically Signed   By: Caprice Renshaw M.D.   On: 03/04/2021 10:31     Assessment and Plan:   Nonobstructive CAD - reassuring stress test in 2018 and CT coronary in 02/2021 with mild nonobstructive disease, although study was incomplete due to heart rate - risk factors include former smoker, HTN, HLD, and PAD - he does not have  any unstable cardiac problems   PAD - occluded right mid SFA in a short segment with one vessel runoff below the knee peroneal arery - physical activity limited by bilateral clubfoot - treated medically given minimal symptoms   DM - per primary, A1c 7.7% - metformin, glipizide, does not require insulin at home   Hypertension - maintained valsartan, amlodipine, coreg - consider IV hydralazine if pressure support needed   Hyperlipidemia with LDL goal < 70 - has tolerated 5 mg crestor - on hold   Preoperative evaluation for MACE Pt has nonobstructive CAD and dyspnea on exertion that has not been evaluated by pulmonary. He can complete 4.0 METS without angina, DOE is intermittent. According to the RCRI, he has 0.9% risk of a cardiac complication during the perioperative period. I think further evaluation for DOE could be considered. No further cardiac workup needed prior to surgery.   Risk Assessment/Risk Scores:      For questions or updates, please contact [facility] Please consult [email or web address] for contact info under  Signed, Roe Rutherford Duke, PA  03/04/2021 2:18 PM   Personally seen and examined. Agree with APP above with the following comments: Briefly 65 yo M with mild non obstructive CAD, PAD, and chronic DOE who presents with SOB. Patient notes that he is able to do moderate activity without SOB but with significant activity gets out of breath. Exam notable for decreased bowel sounds, tachycardia and hypertension.  Patient was activity vomiting his Coreg during our assessment. Labs notable for hyponatremia otherwise WNL Personally reviewed relevant tests; patient has have cardiac assessments including CCTA, NM Stress, and Echo without evidence of significant cardiac disease Would recommend  - patient is low risk for cardiac events for surgery and no further cardiac work up is needed.  He and I discussed the risk of MI, HF, and arrhythmia if surgery is needed  and he was amenable to take those risks if he need to for the SBO. - I have discontinued is present cardiac medication as he cannot tolerate them and he is actively vomiting.  I suspect he may need IV medication is his blood pressure is still high once his vomiting has improved;  I have added PRN IV labetalol for HTN emergency - his home cardiac meds can be restarted post resolution of his SBO. - outpatient DOE eval may include PFTs.  Will follow into 03/05/21.  Riley Lam, MD Cardiologist [facility]  [address], [address] [place], [place] [address] [phone]  5:26 PM

## 2021-03-04 NOTE — ED Notes (Signed)
Pt unable to tolerate po pills. MD at bedside is aware.

## 2021-03-04 NOTE — ED Provider Notes (Signed)
MSE was initiated and I personally evaluated the patient and placed orders (if any) at  3:00 AM on March 04, 2021.  Patient to ED with lower abdominal pain, nausea, vomiting since last evening (10/26) about one hour after eating dinner. No fever, diarrhea, hematemesis, chest pain, SOB, urinary symptoms. No previous abdominal surgeries. No history of similar symptoms.   Today's Vitals   03/04/21 0255 03/04/21 0256  BP: 137/90   Pulse: 88   Resp: 16   Temp: 98.1 F (36.7 C)   TempSrc: Oral   SpO2: 98%   Weight:  94.8 kg  Height:  5\' 7"  (1.702 m)  PainSc:  10-Worst pain ever   Body mass index is 32.73 kg/m.  Uncomfortable appearing Abdomen soft Decreased bowel sounds  The patient appears stable so that the remainder of the MSE may be completed by another provider.   , PA-C 03/04/21 0302    03/06/21, MD 03/04/21 0700

## 2021-03-04 NOTE — Consult Note (Signed)
[facility] Surgery Consult Note  Neng Albee [birth year]  [id number].    Requesting MD: John Giovanni Chief Complaint/Reason for Consult: SBO  HPI:  Decorian Schuenemann is a 65yo male PMH HTN, DM, CAD, PAD, HLD, and chronic neck pain who presented to [facility] complaining of acute onset abdominal pain. States that it started around 1900 last night after dinner. Pain is diffuse but mostly in the left hemiabdomen. Associated with multiple episodes of nausea and vomiting. Last BM was yesterday morning and he has not passed any flatus today. Due to severity of symptoms and lack of improvement, he decided to come to the ED.   CT scan shows Small-bowel obstruction with a transition zone noted within the pelvis on the left. Patient was admitted to the medical service, general surgery asked to see. He has no prior h/o SBO, and no prior surgeries on his abdomen.  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    Cardiovascular: Negative.   Gastrointestinal:  Positive for abdominal pain, constipation, nausea and vomiting.   All systems reviewed and otherwise negative except for as above  Family History  Problem Relation Age of Onset   Diabetes Mother    Coronary artery disease Mother        HAS PACEMAKER   Heart disease Mother    Heart attack Brother    Lung disease Neg Hx     Past Medical History:  Diagnosis Date   Club foot of both lower extremities 1958   Coronary artery disease    Holter monitor 02/2012 - sinus with rare PVC   CTEV (congenital talipes equinovarus)    Gout    Hyperlipidemia    Hypertension    Myocardial infarction [facility]) ~ 2007   "mild"   Osteomyelitis (HCC)    Pneumonia 2000's   "couple times"   Pneumonia due to COVID-19 virus 04/28/2019   Type II diabetes mellitus (HCC) 2011    Past Surgical History:  Procedure Laterality  Date   ANTERIOR CERVICAL DECOMP/DISCECTOMY FUSION  X 2   CARDIAC CATHETERIZATION     FOOT FRACTURE SURGERY Right 1990's   "pt a steel plate in"   FOOT SURGERY Left x16   "joints collapsed; keep getting infected"   POSTERIOR CERVICAL FUSION/FORAMINOTOMY  X 2    Social History:  reports that he quit smoking about 13 years ago. His smoking use included cigarettes. He has a 32.00 pack-year smoking history. He has never used smokeless tobacco. He reports current alcohol use. He reports that he does not use drugs.  Allergies:  Allergies  Allergen Reactions   Morphine Itching   Pravastatin Sodium Other (See Comments)    Severe muscle cramps with one time requiring admission.     (Not in a hospital admission)   Prior to Admission medications   Medication Sig Start Date End Date Taking? Authorizing Provider  allopurinol (ZYLOPRIM) 100 MG tablet Take 1 tablet (100 mg total) by mouth daily. 06/22/20  Yes Albertha Ghee, MD  amLODipine (NORVASC) 10 MG tablet Take 1 tablet (10 mg total) by mouth daily. 05/19/20  Yes Steffanie Rainwater, MD  aspirin EC 81 MG tablet Take 1 tablet (81 mg total) by mouth daily. 03/19/15  Yes Deneise Lever, MD  carvedilol (COREG) 12.5 MG tablet Take 12.5 mg by mouth 2 (two) times daily. 11/30/20  Yes [provider]  colchicine 0.6 MG tablet TAKE 1 TABLET  BY MOUTH  DAILY AS NEEDED FOR GOUT  FLARE Patient taking differently: Take 0.6 mg by mouth daily as needed (gout flares). 06/22/20  Yes Albertha Ghee, MD  gabapentin (NEURONTIN) 300 MG capsule Take 300 mg by mouth 3 (three) times daily. 12/18/20  Yes [provider]  glipiZIDE (GLUCOTROL) 10 MG tablet TAKE 1 TABLET BY MOUTH  TWICE A DAY BEFORE A MEAL Patient taking differently: Take 10 mg by mouth 2 (two) times daily before a meal. 07/06/20  Yes Verdene Lennert, MD  Insulin Pen Needle 32G X 4 MM MISC Use to inject victoza one time a day 11/15/17  Yes Tyson Alias, MD  JARDIANCE 10 MG TABS tablet  Take 10 mg by mouth every morning. 11/29/20  Yes [provider]  liraglutide (VICTOZA) 18 MG/3ML SOPN Inject 1.8 mg into the skin daily as instructed Patient taking differently: Inject 1.8 mg into the skin daily. 07/06/20  Yes Verdene Lennert, MD  nitroGLYCERIN (NITROSTAT) 0.4 MG SL tablet DISSOLVE 1 TABLET UNDER THE TONGUE EVERY 5 MINUTES AS  NEEDED FOR CHEST PAIN , MAX 3 TABLETS IN 15 MINUTES,  CALL 911 IF NO RELIEF. Patient taking differently: Place 0.4 mg under the tongue every 5 (five) minutes as needed for chest pain. 04/07/20  Yes Katsadouros, Vasilios, MD  rosuvastatin (CRESTOR) 5 MG tablet Take 5 mg by mouth at bedtime.   Yes [provider]  tamsulosin (FLOMAX) 0.4 MG CAPS capsule Take 1 capsule (0.4 mg total) by mouth daily. Patient taking differently: Take 0.4 mg by mouth at bedtime. 11/26/19  Yes Aslam, Leanna Sato, MD  traMADol (ULTRAM) 50 MG tablet Take 50 mg by mouth daily as needed. 02/15/21  Yes [provider]  valsartan (DIOVAN) 160 MG tablet Take 160 mg by mouth daily.   Yes [provider]  Accu-Chek Softclix Lancets lancets Check once a day Patient not taking: Reported on 03/04/2021 10/11/19   Claudean Severance, MD  albuterol [facility] HFA) 108 (90 Base) MCG/ACT inhaler Inhale 1-2 puffs into the lungs every 6 (six) hours as needed for wheezing or shortness of breath. Patient not taking: No sig reported 02/13/20   Eliezer Bottom, MD  amoxicillin-clavulanate (AUGMENTIN) 875-125 MG tablet Take 1 tablet by mouth every 12 (twelve) hours. Patient not taking: No sig reported 12/18/20   Raspet, Erin K, PA-C  glucose blood (ACCU-CHEK GUIDE) test strip Check once a day Patient not taking: Reported on 03/04/2021 10/11/19   Claudean Severance, MD  naproxen (NAPROSYN) 500 MG tablet Take 1 tablet (500 mg total) by mouth 2 (two) times daily. Patient not taking: No sig reported 12/19/20   Khatri, Hina, PA-C  rosuvastatin (CRESTOR) 10 MG tablet Take 1 tablet (10 mg total)  by mouth daily. Patient not taking: No sig reported 02/10/21   Corky Crafts, MD  valsartan (DIOVAN) 80 MG tablet Take 1 tablet (80 mg total) by mouth daily. Patient not taking: Reported on 03/04/2021 07/06/20 10/04/20  Verdene Lennert, MD    Blood pressure 127/82, pulse 83, temperature 98.1 F (36.7 C), temperature source Oral, resp. rate 15, height 5\' 7"  (1.702 m), weight 94.8 kg, SpO2 95 %. Physical Exam: General: pleasant, WD/WN male who is laying in bed in NAD HEENT: head is normocephalic, atraumatic.  Sclera are noninjected.  Pupils equal and round.  Ears and nose without any masses or lesions.  Mouth is pink and moist. Dentition fair Heart: regular, rate, and rhythm.  Normal s1,s2. No obvious murmurs, gallops, or rubs noted.  Palpable pedal pulses bilaterally  Lungs: CTAB, no wheezes, rhonchi, or rales noted.  Respiratory effort nonlabored Abd: soft, mild distension, mild diffuse TTP mostly in the left hemiabdomen, +BS, no masses, hernias, or organomegaly MS: no BUE/BLE edema, calves soft and nontender Skin: warm and dry with no masses, lesions, or rashes Psych: A&Ox4 with an appropriate affect Neuro: cranial nerves grossly intact, equal strength in BUE/BLE bilaterally, normal speech, thought process intact  Results for orders placed or performed during the hospital encounter of 03/04/21 (from the past 48 hour(s))  Urinalysis, Routine w reflex microscopic Urine, Clean Catch     Status: Abnormal   Collection Time: 03/04/21  3:00 AM  Result Value Ref Range   Color, Urine YELLOW YELLOW   APPearance CLEAR CLEAR   Specific Gravity, Urine 1.020 1.005 - 1.030   pH 5.0 5.0 - 8.0   Glucose, UA >=500 (A) NEGATIVE mg/dL   Hgb urine dipstick NEGATIVE NEGATIVE   Bilirubin Urine NEGATIVE NEGATIVE   Ketones, ur 20 (A) NEGATIVE mg/dL   Protein, ur NEGATIVE NEGATIVE mg/dL   Nitrite NEGATIVE NEGATIVE   Leukocytes,Ua NEGATIVE NEGATIVE   RBC / HPF 0-5 0 - 5 RBC/hpf   WBC, UA 0-5 0 - 5  WBC/hpf   Bacteria, UA NONE SEEN NONE SEEN   Squamous Epithelial / LPF 0-5 0 - 5   Mucus PRESENT     Comment: Performed at [facility], [address] [place], [place] [address]  CBC with Differential     Status: Abnormal   Collection Time: 03/04/21  3:13 AM  Result Value Ref Range   WBC 9.2 4.0 - 10.5 K/uL   RBC 5.73 4.22 - 5.81 MIL/uL   Hemoglobin 13.0 13.0 - 17.0 g/dL   HCT 10.9 32.3 - 55.7 %   MCV 72.4 (L) 80.0 - 100.0 fL   MCH 22.7 (L) 26.0 - 34.0 pg   MCHC 31.3 30.0 - 36.0 g/dL   RDW 32.2 (H) 02.5 - 42.7 %   Platelets 347 150 - 400 K/uL   nRBC 0.0 0.0 - 0.2 %   Neutrophils Relative % 72 %   Neutro Abs 6.6 1.7 - 7.7 K/uL   Lymphocytes Relative 16 %   Lymphs Abs 1.4 0.7 - 4.0 K/uL   Monocytes Relative 10 %   Monocytes Absolute 0.9 0.1 - 1.0 K/uL   Eosinophils Relative 2 %   Eosinophils Absolute 0.2 0.0 - 0.5 K/uL   Basophils Relative 0 %   Basophils Absolute 0.0 0.0 - 0.1 K/uL   Immature Granulocytes 0 %   Abs Immature Granulocytes 0.04 0.00 - 0.07 K/uL    Comment: Performed at [facility], [address], [place], [place] [address]  Comprehensive metabolic panel     Status: Abnormal   Collection Time: 03/04/21  3:13 AM  Result Value Ref Range   Sodium 131 (L) 135 - 145 mmol/L   Potassium 4.1 3.5 - 5.1 mmol/L   Chloride 101 98 - 111 mmol/L   CO2 20 (L) 22 - 32 mmol/L   Glucose, Bld 168 (H) 70 - 99 mg/dL    Comment: Glucose reference range applies only to samples taken after fasting for at least 8 hours.   BUN 20 8 - 23 mg/dL   Creatinine, Ser 6.28 0.61 - 1.24 mg/dL   Calcium 9.5 8.9 - 31.5 mg/dL   Total Protein 8.0 6.5 - 8.1 g/dL   Albumin 3.5 3.5 - 5.0 g/dL   AST 20  15 - 41 U/L   ALT 14 0 - 44 U/L   Alkaline Phosphatase 93 38 - 126 U/L   Total Bilirubin 1.0 0.3 - 1.2 mg/dL   GFR, Estimated >89 >37 mL/min    Comment: (NOTE) Calculated using the CKD-EPI Creatinine Equation (2021)    Anion gap 10 5 - 15    Comment: Performed at [facility], [address], [place], [place] [address]  Lactic acid, plasma     Status: None   Collection Time: 03/04/21  3:13 AM  Result Value Ref Range   Lactic Acid, Venous 1.4 0.5 - 1.9 mmol/L    Comment: Performed at [facility], [address], [place], [place] [address]  CBG monitoring, ED     Status: Abnormal   Collection Time: 03/04/21  8:13 AM  Result Value Ref Range   Glucose-Capillary 164 (H) 70 - 99 mg/dL    Comment: Glucose reference range applies only to samples taken after fasting for at least 8 hours.   CT ABDOMEN PELVIS W CONTRAST  Result Date: 03/04/2021 CLINICAL DATA:  Abdominal pain. EXAM: CT ABDOMEN AND PELVIS WITH CONTRAST TECHNIQUE: Multidetector CT imaging of the abdomen and pelvis was performed using the standard protocol following bolus administration of intravenous contrast. CONTRAST:  OMNIPAQUE IOHEXOL 300 MG/ML  SOLN COMPARISON:  None. FINDINGS: Lower chest: No acute abnormality. Hepatobiliary: There is diffuse fatty infiltration of the liver parenchyma. No focal liver abnormality is seen. No gallstones, gallbladder wall thickening, or biliary dilatation. Pancreas: Unremarkable. No pancreatic ductal dilatation or surrounding inflammatory changes. Spleen: Normal in size without focal abnormality. Adrenals/Urinary Tract: Adrenal glands are unremarkable. Kidneys are normal, without renal calculi, focal lesion, or hydronephrosis. Bladder is unremarkable. Stomach/Bowel: There is a small hiatal hernia. Appendix appears normal. Multiple dilated small bowel loops are seen within the mid and lower left abdomen. A transition zone is suspected within the pelvis on the left (axial CT images 57 through 62, CT series 3). Vascular/Lymphatic: Aortic atherosclerosis. No enlarged abdominal or pelvic lymph nodes. Reproductive: Prostate is unremarkable. Other: No abdominal wall hernia or abnormality. No abdominopelvic ascites. Musculoskeletal: No acute or significant osseous  findings. IMPRESSION: 1. Small-bowel obstruction with a transition zone noted within the pelvis on the left. 2. Hepatic steatosis. 3. Aortic atherosclerosis. Aortic Atherosclerosis (ICD10-I70.0). Electronically Signed   By: Aram Candela M.D.   On: 03/04/2021 04:04    Anti-infectives (From admission, onward)    None        Assessment/Plan SBO - no past abdominal surgical history - CT scan shows Small-bowel obstruction with a transition zone noted within the pelvis on the left - Recommend NG tube placement for decompression. Will start small bowel obstruction protocol. Given the fact that he has never had abdominal surgery I suspect that he will need surgery this admission if obstruction does not resolve.   ID - none VTE - SCDs, sq heparin FEN - IVF, NPO/NGT to LIWS Foley - none  DM HTN CAD h/o MI several years ago - followed by Dr. Eldridge Dace PAD HLD Chronic neck pain  Franne Forts, [facility] 03/04/2021, 9:08 AM Please see Amion for pager number during day hours 7:00am-4:30pm

## 2021-03-05 ENCOUNTER — Encounter (HOSPITAL_COMMUNITY): Payer: Self-pay | Admitting: Internal Medicine

## 2021-03-05 ENCOUNTER — Inpatient Hospital Stay (HOSPITAL_COMMUNITY): Payer: Medicare Other | Admitting: Certified Registered Nurse Anesthetist

## 2021-03-05 ENCOUNTER — Inpatient Hospital Stay (HOSPITAL_COMMUNITY): Payer: Medicare Other

## 2021-03-05 ENCOUNTER — Encounter (HOSPITAL_COMMUNITY)
Admission: EM | Disposition: A | Discharge status: Rehabilitation Facility | Payer: Self-pay | Source: Home / Self Care | Attending: Student

## 2021-03-05 DIAGNOSIS — I10 Essential (primary) hypertension: Secondary | ICD-10-CM | POA: Diagnosis not present

## 2021-03-05 DIAGNOSIS — K56609 Unspecified intestinal obstruction, unspecified as to partial versus complete obstruction: Secondary | ICD-10-CM | POA: Diagnosis not present

## 2021-03-05 DIAGNOSIS — E0865 Diabetes mellitus due to underlying condition with hyperglycemia: Secondary | ICD-10-CM | POA: Diagnosis not present

## 2021-03-05 HISTORY — PX: LAPAROSCOPY: SHX197

## 2021-03-05 HISTORY — PX: LYSIS OF ADHESION: SHX5961

## 2021-03-05 HISTORY — PX: LAPAROTOMY: SHX154

## 2021-03-05 LAB — GLUCOSE, CAPILLARY
Glucose-Capillary: 133 mg/dL — ABNORMAL HIGH (ref 70–99)
Glucose-Capillary: 154 mg/dL — ABNORMAL HIGH (ref 70–99)
Glucose-Capillary: 157 mg/dL — ABNORMAL HIGH (ref 70–99)
Glucose-Capillary: 174 mg/dL — ABNORMAL HIGH (ref 70–99)
Glucose-Capillary: 178 mg/dL — ABNORMAL HIGH (ref 70–99)
Glucose-Capillary: 180 mg/dL — ABNORMAL HIGH (ref 70–99)
Glucose-Capillary: 185 mg/dL — ABNORMAL HIGH (ref 70–99)
Glucose-Capillary: 192 mg/dL — ABNORMAL HIGH (ref 70–99)

## 2021-03-05 LAB — HIV ANTIBODY (ROUTINE TESTING W REFLEX): HIV Screen 4th Generation wRfx: NONREACTIVE

## 2021-03-05 LAB — BASIC METABOLIC PANEL
Anion gap: 13 (ref 5–15)
BUN: 24 mg/dL — ABNORMAL HIGH (ref 8–23)
CO2: 20 mmol/L — ABNORMAL LOW (ref 22–32)
Calcium: 9.2 mg/dL (ref 8.9–10.3)
Chloride: 100 mmol/L (ref 98–111)
Creatinine, Ser: 1.39 mg/dL — ABNORMAL HIGH (ref 0.61–1.24)
GFR, Estimated: 57 mL/min — ABNORMAL LOW (ref >60)
Glucose, Bld: 169 mg/dL — ABNORMAL HIGH (ref 70–99)
Potassium: 4.1 mmol/L (ref 3.5–5.1)
Sodium: 133 mmol/L — ABNORMAL LOW (ref 135–145)

## 2021-03-05 LAB — SURGICAL PCR SCREEN
MRSA, PCR: NEGATIVE
Staphylococcus aureus: NEGATIVE

## 2021-03-05 SURGERY — LAPAROSCOPY, DIAGNOSTIC
Anesthesia: General | Site: Abdomen | Laterality: Right

## 2021-03-05 MED ORDER — SUCCINYLCHOLINE CHLORIDE 200 MG/10ML IV SOSY
PREFILLED_SYRINGE | INTRAVENOUS | Status: DC | PRN
  Administered 2021-03-05: 140 mg via INTRAVENOUS

## 2021-03-05 MED ORDER — 0.9 % SODIUM CHLORIDE (POUR BTL) OPTIME
TOPICAL | Status: DC | PRN
  Administered 2021-03-05: 2500 mL

## 2021-03-05 MED ORDER — FENTANYL CITRATE (PF) 250 MCG/5ML IJ SOLN
INTRAMUSCULAR | Status: DC | PRN
  Administered 2021-03-05: 100 ug via INTRAVENOUS

## 2021-03-05 MED ORDER — ONDANSETRON HCL 4 MG/2ML IJ SOLN
INTRAMUSCULAR | Status: DC | PRN
  Administered 2021-03-05: 4 mg via INTRAVENOUS

## 2021-03-05 MED ORDER — PHENYLEPHRINE HCL-NACL 20-0.9 MG/250ML-% IV SOLN
INTRAVENOUS | Status: DC | PRN
  Administered 2021-03-05: 25 ug/min via INTRAVENOUS

## 2021-03-05 MED ORDER — METHOCARBAMOL 1000 MG/10ML IJ SOLN
500.0000 mg | Freq: Four times a day (QID) | INTRAMUSCULAR | Status: DC
  Administered 2021-03-06 – 2021-03-10 (×17): 500 mg via INTRAVENOUS
  Filled 2021-03-05: qty 500
  Filled 2021-03-05: qty 5
  Filled 2021-03-05 (×5): qty 500
  Filled 2021-03-05: qty 5
  Filled 2021-03-05: qty 500
  Filled 2021-03-05 (×2): qty 5
  Filled 2021-03-05 (×2): qty 500
  Filled 2021-03-05 (×3): qty 5
  Filled 2021-03-05: qty 500
  Filled 2021-03-05 (×2): qty 5
  Filled 2021-03-05: qty 500

## 2021-03-05 MED ORDER — HYDROMORPHONE HCL 1 MG/ML IJ SOLN
0.5000 mg | INTRAMUSCULAR | Status: DC | PRN
Start: 2021-03-05 — End: 2021-03-25
  Administered 2021-03-06 – 2021-03-25 (×36): 0.5 mg via INTRAVENOUS
  Filled 2021-03-05 (×39): qty 0.5

## 2021-03-05 MED ORDER — OXYCODONE HCL 5 MG PO TABS: 5.0000 mg | ORAL_TABLET | Freq: Once | ORAL | Status: DC | PRN

## 2021-03-05 MED ORDER — PROPOFOL 10 MG/ML IV BOLUS
INTRAVENOUS | Status: AC
  Filled 2021-03-05: qty 20

## 2021-03-05 MED ORDER — ONDANSETRON HCL 4 MG/2ML IJ SOLN: 4.0000 mg | Freq: Once | INTRAMUSCULAR | Status: DC | PRN

## 2021-03-05 MED ORDER — BUPIVACAINE-EPINEPHRINE (PF) 0.25% -1:200000 IJ SOLN
INTRAMUSCULAR | Status: DC | PRN
  Administered 2021-03-05: 8 mL

## 2021-03-05 MED ORDER — ACETAMINOPHEN 10 MG/ML IV SOLN
1000.0000 mg | Freq: Four times a day (QID) | INTRAVENOUS | Status: AC
  Administered 2021-03-06 (×4): 1000 mg via INTRAVENOUS
  Filled 2021-03-05 (×4): qty 100

## 2021-03-05 MED ORDER — SUGAMMADEX SODIUM 200 MG/2ML IV SOLN
INTRAVENOUS | Status: DC | PRN
  Administered 2021-03-05: 200 mg via INTRAVENOUS

## 2021-03-05 MED ORDER — CHLORHEXIDINE GLUCONATE 0.12 % MT SOLN: 15.0000 mL | Freq: Once | OROMUCOSAL | Status: AC

## 2021-03-05 MED ORDER — ORAL CARE MOUTH RINSE: 15.0000 mL | Freq: Once | OROMUCOSAL | Status: AC

## 2021-03-05 MED ORDER — BUPIVACAINE HCL (PF) 0.25 % IJ SOLN
INTRAMUSCULAR | Status: AC
  Filled 2021-03-05: qty 30

## 2021-03-05 MED ORDER — HYDROMORPHONE HCL 1 MG/ML IJ SOLN
INTRAMUSCULAR | Status: AC
  Filled 2021-03-05: qty 1

## 2021-03-05 MED ORDER — HYDROMORPHONE HCL 1 MG/ML IJ SOLN
0.2500 mg | INTRAMUSCULAR | Status: DC | PRN
  Administered 2021-03-05 (×2): 0.5 mg via INTRAVENOUS

## 2021-03-05 MED ORDER — LIDOCAINE 5 % EX PTCH
1.0000 | MEDICATED_PATCH | CUTANEOUS | Status: DC
  Administered 2021-03-06 – 2021-03-22 (×16): 1 via TRANSDERMAL
  Filled 2021-03-05 (×18): qty 1

## 2021-03-05 MED ORDER — MIDAZOLAM HCL 2 MG/2ML IJ SOLN
INTRAMUSCULAR | Status: AC
  Filled 2021-03-05: qty 2

## 2021-03-05 MED ORDER — DEXAMETHASONE SODIUM PHOSPHATE 10 MG/ML IJ SOLN
INTRAMUSCULAR | Status: DC | PRN
Start: 2021-03-05 — End: 2021-03-05
  Administered 2021-03-05: 5 mg via INTRAVENOUS

## 2021-03-05 MED ORDER — LIDOCAINE 2% (20 MG/ML) 5 ML SYRINGE
INTRAMUSCULAR | Status: DC | PRN
  Administered 2021-03-05: 60 mg via INTRAVENOUS

## 2021-03-05 MED ORDER — PROPOFOL 10 MG/ML IV BOLUS
INTRAVENOUS | Status: DC | PRN
  Administered 2021-03-05: 150 mg via INTRAVENOUS

## 2021-03-05 MED ORDER — LACTATED RINGERS IV SOLN: INTRAVENOUS | Status: DC

## 2021-03-05 MED ORDER — OXYCODONE HCL 5 MG/5ML PO SOLN: 5.0000 mg | Freq: Once | ORAL | Status: DC | PRN

## 2021-03-05 MED ORDER — MIDAZOLAM HCL 5 MG/5ML IJ SOLN
INTRAMUSCULAR | Status: DC | PRN
  Administered 2021-03-05 (×2): 1 mg via INTRAVENOUS

## 2021-03-05 MED ORDER — FENTANYL CITRATE (PF) 250 MCG/5ML IJ SOLN
INTRAMUSCULAR | Status: AC
  Filled 2021-03-05: qty 5

## 2021-03-05 MED ORDER — CHLORHEXIDINE GLUCONATE 0.12 % MT SOLN
OROMUCOSAL | Status: AC
  Administered 2021-03-05: 15 mL via OROMUCOSAL
  Filled 2021-03-05: qty 15

## 2021-03-05 MED ORDER — ROCURONIUM BROMIDE 10 MG/ML (PF) SYRINGE
PREFILLED_SYRINGE | INTRAVENOUS | Status: DC | PRN
  Administered 2021-03-05: 50 mg via INTRAVENOUS

## 2021-03-05 MED ORDER — PHENYLEPHRINE 40 MCG/ML (10ML) SYRINGE FOR IV PUSH (FOR BLOOD PRESSURE SUPPORT)
PREFILLED_SYRINGE | INTRAVENOUS | Status: DC | PRN
  Administered 2021-03-05 (×3): 120 ug via INTRAVENOUS

## 2021-03-05 MED ORDER — BUPIVACAINE-EPINEPHRINE (PF) 0.25% -1:200000 IJ SOLN
INTRAMUSCULAR | Status: AC
  Filled 2021-03-05: qty 30

## 2021-03-05 MED ORDER — AMISULPRIDE (ANTIEMETIC) 5 MG/2ML IV SOLN: 10.0000 mg | Freq: Once | INTRAVENOUS | Status: DC | PRN

## 2021-03-05 SURGICAL SUPPLY — 58 items
ADH SKN CLS APL DERMABOND .7 (GAUZE/BANDAGES/DRESSINGS) ×3
APL PRP STRL LF DISP 70% ISPRP (MISCELLANEOUS) ×3
BAG COUNTER SPONGE SURGICOUNT (BAG) ×4 IMPLANT
BAG SPNG CNTER NS LX DISP (BAG) ×3
BLADE CLIPPER SURG (BLADE) IMPLANT
CANISTER SUCT 3000ML PPV (MISCELLANEOUS) ×4 IMPLANT
CELLS DAT CNTRL 66122 CELL SVR (MISCELLANEOUS) ×3 IMPLANT
CHLORAPREP W/TINT 26 (MISCELLANEOUS) ×4 IMPLANT
COVER SURGICAL LIGHT HANDLE (MISCELLANEOUS) ×4 IMPLANT
DECANTER SPIKE VIAL GLASS SM (MISCELLANEOUS) ×6 IMPLANT
DERMABOND ADVANCED (GAUZE/BANDAGES/DRESSINGS) ×1
DERMABOND ADVANCED .7 DNX12 (GAUZE/BANDAGES/DRESSINGS) ×3 IMPLANT
DRAPE LAPAROSCOPIC ABDOMINAL (DRAPES) ×4 IMPLANT
DRAPE WARM FLUID 44X44 (DRAPES) ×4 IMPLANT
DRSG COVADERM PLUS 2X2 (GAUZE/BANDAGES/DRESSINGS) ×5 IMPLANT
DRSG OPSITE POSTOP 4X10 (GAUZE/BANDAGES/DRESSINGS) IMPLANT
DRSG OPSITE POSTOP 4X6 (GAUZE/BANDAGES/DRESSINGS) ×1 IMPLANT
DRSG OPSITE POSTOP 4X8 (GAUZE/BANDAGES/DRESSINGS) IMPLANT
ELECT BLADE 6.5 EXT (BLADE) IMPLANT
ELECT CAUTERY BLADE 6.4 (BLADE) ×4 IMPLANT
ELECT REM PT RETURN 9FT ADLT (ELECTROSURGICAL) ×4
ELECTRODE REM PT RTRN 9FT ADLT (ELECTROSURGICAL) ×3 IMPLANT
GLOVE SURG ENC MOIS LTX SZ6 (GLOVE) ×4 IMPLANT
GLOVE SURG UNDER LTX SZ6.5 (GLOVE) ×4 IMPLANT
GOWN STRL REUS W/ TWL LRG LVL3 (GOWN DISPOSABLE) ×9 IMPLANT
GOWN STRL REUS W/TWL LRG LVL3 (GOWN DISPOSABLE) ×12
HANDLE SUCTION POOLE (INSTRUMENTS) ×3 IMPLANT
KIT BASIN OR (CUSTOM PROCEDURE TRAY) ×4 IMPLANT
KIT TURNOVER KIT B (KITS) ×4 IMPLANT
LIGASURE IMPACT 36 18CM CVD LR (INSTRUMENTS) IMPLANT
NS IRRIG 1000ML POUR BTL (IV SOLUTION) ×8 IMPLANT
PACK GENERAL/GYN (CUSTOM PROCEDURE TRAY) ×3 IMPLANT
PAD ARMBOARD 7.5X6 YLW CONV (MISCELLANEOUS) ×8 IMPLANT
PENCIL SMOKE EVACUATOR (MISCELLANEOUS) ×4 IMPLANT
RETRACTOR WND ALEXIS 18 MED (MISCELLANEOUS) IMPLANT
RTRCTR WOUND ALEXIS 18CM MED (MISCELLANEOUS) ×4
SCISSORS LAP 5X35 DISP (ENDOMECHANICALS) IMPLANT
SET IRRIG TUBING LAPAROSCOPIC (IRRIGATION / IRRIGATOR) IMPLANT
SET TUBE SMOKE EVAC HIGH FLOW (TUBING) ×4 IMPLANT
SLEEVE ENDOPATH XCEL 5M (ENDOMECHANICALS) ×4 IMPLANT
SPECIMEN JAR LARGE (MISCELLANEOUS) IMPLANT
SPONGE T-LAP 18X18 ~~LOC~~+RFID (SPONGE) ×1 IMPLANT
STAPLER VISISTAT 35W (STAPLE) ×3 IMPLANT
SUCTION POOLE HANDLE (INSTRUMENTS) ×4
SUT MNCRL AB 4-0 PS2 18 (SUTURE) ×4 IMPLANT
SUT PDS AB 1 TP1 96 (SUTURE) ×6 IMPLANT
SUT SILK 2 0 SH CR/8 (SUTURE) ×4 IMPLANT
SUT SILK 2 0 TIES 10X30 (SUTURE) ×4 IMPLANT
SUT SILK 3 0 SH CR/8 (SUTURE) ×4 IMPLANT
SUT SILK 3 0 TIES 10X30 (SUTURE) ×4 IMPLANT
SUT VIC AB 3-0 SH 18 (SUTURE) ×1 IMPLANT
TOWEL GREEN STERILE (TOWEL DISPOSABLE) ×4 IMPLANT
TOWEL GREEN STERILE FF (TOWEL DISPOSABLE) ×4 IMPLANT
TRAY FOLEY MTR SLVR 16FR STAT (SET/KITS/TRAYS/PACK) IMPLANT
TRAY LAPAROSCOPIC MC (CUSTOM PROCEDURE TRAY) ×4 IMPLANT
TROCAR XCEL BLUNT TIP 100MML (ENDOMECHANICALS) IMPLANT
TROCAR XCEL NON-BLD 11X100MML (ENDOMECHANICALS) IMPLANT
TROCAR XCEL NON-BLD 5MMX100MML (ENDOMECHANICALS) ×7 IMPLANT

## 2021-03-05 NOTE — Anesthesia Procedure Notes (Addendum)
Procedure Name: Intubation Date/Time: 03/05/2021 10:28 AM Performed by: Lelon Perla, CRNA Pre-anesthesia Checklist: Patient identified, Emergency Drugs available, Suction available and Patient being monitored Patient Re-evaluated:Patient Re-evaluated prior to induction Oxygen Delivery Method: Circle system utilized Preoxygenation: Pre-oxygenation with 100% oxygen Induction Type: IV induction, Rapid sequence and Cricoid Pressure applied Laryngoscope Size: Glidescope and 4 Tube type: Oral Number of attempts: 1 Airway Equipment and Method: Oral airway, Video-laryngoscopy and Rigid stylet Placement Confirmation: ETT inserted through vocal cords under direct vision, positive ETCO2 and breath sounds checked- equal and bilateral Secured at: 22 cm Tube secured with: Tape Dental Injury: Teeth and Oropharynx as per pre-operative assessment  Difficulty Due To: Difficulty was anticipated and Difficult Airway- due to reduced neck mobility Comments: RSI. Pt positioned neck for comfort before induction, neck not manipulated after initial positioning.

## 2021-03-05 NOTE — Plan of Care (Signed)
  Problem: Education: Goal: Knowledge of General Education information will improve Description: Including pain rating scale, medication(s)/side effects and non-pharmacologic comfort measures Outcome: Progressing Note: Patient educated on procedure planned in the morning.

## 2021-03-05 NOTE — Transfer of Care (Signed)
Immediate Anesthesia Transfer of Care Note  Patient: Daniel Perkins  Procedure(s) Performed: LAPAROSCOPY DIAGNOSTIC EXPLORATORY LAPAROTOMY, REPAIR SMALL BOWEL PERFORATION (Right: Abdomen) LYSIS OF ADHESION (Abdomen)  Patient Location: PACU  Anesthesia Type:General  Level of Consciousness: awake and drowsy  Airway & Oxygen Therapy: Patient Spontanous Breathing and Patient connected to nasal cannula oxygen  Post-op Assessment: Report given to RN and Post -op Vital signs reviewed and stable  Post vital signs: Reviewed and stable  Last Vitals:  Vitals Value Taken Time  BP 121/78 03/05/21 1150  Temp    Pulse 78 03/05/21 1154  Resp 26 03/05/21 1154  SpO2 96 % 03/05/21 1154  Vitals shown include unvalidated device data.  Last Pain:  Vitals:   03/05/21 0904  TempSrc: Oral  PainSc: 0-No pain         Complications: No notable events documented.

## 2021-03-05 NOTE — Anesthesia Postprocedure Evaluation (Signed)
Anesthesia Post Note  Patient: Daniel Perkins  Procedure(s) Performed: LAPAROSCOPY DIAGNOSTIC EXPLORATORY LAPAROTOMY, REPAIR SMALL BOWEL PERFORATION (Right: Abdomen) LYSIS OF ADHESION (Abdomen)     Patient location during evaluation: PACU Anesthesia Type: General Level of consciousness: awake and alert, oriented and patient cooperative Pain management: pain level controlled Vital Signs Assessment: post-procedure vital signs reviewed and stable Respiratory status: spontaneous breathing, nonlabored ventilation and respiratory function stable Cardiovascular status: blood pressure returned to baseline and stable Postop Assessment: no apparent nausea or vomiting Anesthetic complications: no   No notable events documented.  Last Vitals:  Vitals:   03/05/21 1250 03/05/21 1312  BP: 102/67 105/73  Pulse: 77 83  Resp: 20 19  Temp:  36.9 C  SpO2: 95% 97%    Last Pain:  Vitals:   03/05/21 1312  TempSrc: Oral  PainSc:                  Lannie Fields

## 2021-03-05 NOTE — Anesthesia Preprocedure Evaluation (Addendum)
Anesthesia Evaluation  Patient identified by MRN, date of birth, ID band Patient awake    Reviewed: Allergy & Precautions, NPO status , Patient's Chart, lab work & pertinent test results, reviewed documented beta blocker date and time   Airway Mallampati: II  TM Distance: >3 FB Neck ROM: Limited   Comment: Very limitr ROM especially extension of neck 2/2 multiple C spine surgeries Dental  (+) Poor Dentition, Missing, Dental Advisory Given, Edentulous Upper,    Pulmonary former smoker,  Former smoker, quit 2009, 32 pack years    Pulmonary exam normal breath sounds clear to auscultation       Cardiovascular hypertension, Pt. on medications and Pt. on home beta blockers + CAD, + Past MI and + Peripheral Vascular Disease  Normal cardiovascular exam Rhythm:Regular Rate:Normal  Echo 2020: 1. The left ventricle has normal systolic function with an ejection  fraction of 60-65%. The cavity size was normal. There is moderately  increased left ventricular wall thickness. Left ventricular diastolic  Doppler parameters are consistent with impaired  relaxation Indeterminent filling pressures The E/e' is 8-15.  2. The right ventricle has normal systolic function. The cavity was  normal. There is no increase in right ventricular wall thickness.  3. The mitral valve is degenerative. Mild thickening of the mitral valve  leaflet.  4. The tricuspid valve is normal in structure.  5. The aortic valve is tricuspid Mild calcification of the aortic valve.  Aortic valve regurgitation is trivial by color flow Doppler.  6. The aortic root and ascending aorta are normal in size and structure.  7. The interatrial septum was not well visualized.  8. When compared to the prior study: 08/11/10 EF 65-70%.   2018:  Nuclear stress EF: 61%.  Blood pressure demonstrated a normal response to exercise.  There was no ST segment deviation noted during  stress.  The study is normal.  This is a low risk study.  The left ventricular ejection fraction is normal (55-65%).      Neuro/Psych negative psych ROS   GI/Hepatic Neg liver ROS, GERD  Controlled,SBO   Endo/Other  diabetes, Well Controlled, Type 2, Insulin Dependent, Oral Hypoglycemic AgentsObesity BMI 33 a1c 6.0 FS 174 this AM  Renal/GU Renal InsufficiencyRenal diseaseCr 1.39  negative genitourinary   Musculoskeletal negative musculoskeletal ROS (+)   Abdominal (+) + obese,   Peds  Hematology hct 41.5   Anesthesia Other Findings   Reproductive/Obstetrics negative OB ROS                            Anesthesia Physical Anesthesia Plan  ASA: 3  Anesthesia Plan: General   Post-op Pain Management:    Induction: Intravenous, Rapid sequence and Cricoid pressure planned  PONV Risk Score and Plan: 3 and Ondansetron, Dexamethasone, Midazolam and Treatment may vary due to age or medical condition  Airway Management Planned: Video Laryngoscope Planned and Oral ETT  Additional Equipment: None  Intra-op Plan:   Post-operative Plan: Extubation in OR  Informed Consent: I have reviewed the patients History and Physical, chart, labs and discussed the procedure including the risks, benefits and alternatives for the proposed anesthesia with the patient or authorized representative who has indicated his/her understanding and acceptance.     Dental advisory given  Plan Discussed with: CRNA  Anesthesia Plan Comments: (Intubation w/ video laryngoscopy and stabilization of neck d/t pain with all ROM in C spine)       Anesthesia Quick Evaluation

## 2021-03-05 NOTE — Progress Notes (Signed)
PROGRESS NOTE    Daniel Perkins  WUJ:[id number] DOB: 06/13/1955 DOA: 03/04/2021 PCP: Ellyn Hack, MD    Brief Narrative:  65 year old gentleman with history of hypertension, type 2 diabetes, coronary artery disease, peripheral artery disease hyperlipidemia presented with acute onset abdominal pain and found to have small bowel obstruction with transition zone on the left side of the pelvis.  Admitted with surgical consultation.  Seen by cardiology for preop clearance and determined okay to go to surgery.   Assessment & Plan:   Active Problems:   Diabetes mellitus due to underlying condition, uncontrolled, with hyperglycemia (HCC)   Hyperlipemia   HYPERTENSION   Gout   SBO (small bowel obstruction) (HCC)  Small bowel obstruction: NG tube.  NPO.  IV fluids.  No response to conservative management, going for ex lap today with surgery.  Continue adequate pain medications.  Postsurgical management as per surgery.  Type 2 diabetes: On Victoza and insulin as well as Jardiance at home.  Insulin continued.  We will keep on lower doses of sliding scale insulin while NPO.  Essential hypertension blood pressures well controlled.  Currently unable to take medications by mouth.  On labetalol as needed.  Will resume antihypertensive after surgery.  Hyperlipidemia: On a statin.  Will resume after surgery.   DVT prophylaxis: SCD's Start: 03/04/21 2207 heparin injection 5,000 Units Start: 03/04/21 0730   Code Status: Full code Family Communication: Daughter at the bedside Disposition Plan: Status is: Inpatient  Remains inpatient appropriate because: Going to surgery         Consultants:  General surgery Cardiology  Procedures:  Ex lap, plan today  Antimicrobials:  None   Subjective: Patient seen and examined.  Daughter at the bedside.  Surgical team was at bedside.  Patient complained of improved distention and pain after NG tube drainage but not passing any flatus or  bowel movement.  Denies any pain at the time of my interview.  Surgical team discussing about taking him to operating room.  Objective: Vitals:   03/05/21 0002 03/05/21 0526 03/05/21 0744 03/05/21 0904  BP: 129/83 113/79 117/73 137/87  Pulse: 86 93 97 97  Resp: 20 17 19 18   Temp: 98.9 F (37.2 C) 99.1 F (37.3 C) 97.6 F (36.4 C) 98.1 F (36.7 C)  TempSrc: Oral Oral Oral Oral  SpO2: 97% 98% 99% 98%  Weight:      Height:        Intake/Output Summary (Last 24 hours) at 03/05/2021 1127 Last data filed at 03/05/2021 1035 Gross per 24 hour  Intake 1166.75 ml  Output 1050 ml  Net 116.75 ml   Filed Weights   03/04/21 0256  Weight: 94.8 kg    Examination:  General: Looks fairly comfortable.  NG tube actively draining.  On room air. Cardiovascular: S1-S2 normal.  No added sounds. Respiratory: Bilateral clear.  No added sounds. Gastrointestinal: Soft.  Nontender.  Bowel sounds absent. Ext: No swelling or edema.  No cyanosis.      Data Reviewed: I have personally reviewed following labs and imaging studies  CBC: Recent Labs  Lab 03/04/21 0313  WBC 9.2  NEUTROABS 6.6  HGB 13.0  HCT 41.5  MCV 72.4*  PLT 347   Basic Metabolic Panel: Recent Labs  Lab 03/04/21 0313 03/05/21 0307  NA 131* 133*  K 4.1 4.1  CL 101 100  CO2 20* 20*  GLUCOSE 168* 169*  BUN 20 24*  CREATININE 1.19 1.39*  CALCIUM 9.5 9.2   GFR:  Estimated Creatinine Clearance: 58.9 mL/min (A) (by C-G formula based on SCr of 1.39 mg/dL (H)). Liver Function Tests: Recent Labs  Lab 03/04/21 0313  AST 20  ALT 14  ALKPHOS 93  BILITOT 1.0  PROT 8.0  ALBUMIN 3.5   No results for input(s): LIPASE, AMYLASE in the last 168 hours. No results for input(s): AMMONIA in the last 168 hours. Coagulation Profile: No results for input(s): INR, PROTIME in the last 168 hours. Cardiac Enzymes: No results for input(s): CKTOTAL, CKMB, CKMBINDEX, TROPONINI in the last 168 hours. BNP (last 3 results) No  results for input(s): PROBNP in the last 8760 hours. HbA1C: Recent Labs    03/04/21 2228  HGBA1C 6.0*   CBG: Recent Labs  Lab 03/04/21 1222 03/04/21 1856 03/05/21 0001 03/05/21 0554 03/05/21 0806  GLUCAP 127* 133* 157* 180* 174*   Lipid Profile: No results for input(s): CHOL, HDL, LDLCALC, TRIG, CHOLHDL, LDLDIRECT in the last 72 hours. Thyroid Function Tests: No results for input(s): TSH, T4TOTAL, FREET4, T3FREE, THYROIDAB in the last 72 hours. Anemia Panel: No results for input(s): VITAMINB12, FOLATE, FERRITIN, TIBC, IRON, RETICCTPCT in the last 72 hours. Sepsis Labs: Recent Labs  Lab 03/04/21 0313  LATICACIDVEN 1.4    Recent Results (from the past 240 hour(s))  Resp Panel by RT-PCR (Flu A&B, Covid) Nasopharyngeal Swab     Status: None   Collection Time: 03/04/21  7:44 AM   Specimen: Nasopharyngeal Swab; Nasopharyngeal(NP) swabs in vial transport medium  Result Value Ref Range Status   SARS Coronavirus 2 by RT PCR NEGATIVE NEGATIVE Final    Comment: (NOTE) SARS-CoV-2 target nucleic acids are NOT DETECTED.  The SARS-CoV-2 RNA is generally detectable in upper respiratory specimens during the acute phase of infection. The lowest concentration of SARS-CoV-2 viral copies this assay can detect is 138 copies/mL. A negative result does not preclude SARS-Cov-2 infection and should not be used as the sole basis for treatment or other patient management decisions. A negative result may occur with  improper specimen collection/handling, submission of specimen other than nasopharyngeal swab, presence of viral mutation(s) within the areas targeted by this assay, and inadequate number of viral copies(<138 copies/mL). A negative result must be combined with clinical observations, patient history, and epidemiological information. The expected result is Negative.  Fact Sheet for Patients:  BloggerCourse.com  Fact Sheet for Healthcare Providers:   SeriousBroker.it  This test is no t yet approved or cleared by the [place] FDA and  has been authorized for detection and/or diagnosis of SARS-CoV-2 by FDA under an Emergency Use Authorization (EUA). This EUA will remain  in effect (meaning this test can be used) for the duration of the COVID-19 declaration under Section 564(b)(1) of the Act, 21 U.S.C.section 360bbb-3(b)(1), unless the authorization is terminated  or revoked sooner.       Influenza A by PCR NEGATIVE NEGATIVE Final   Influenza B by PCR NEGATIVE NEGATIVE Final    Comment: (NOTE) The Xpert Xpress SARS-CoV-2/FLU/RSV plus assay is intended as an aid in the diagnosis of influenza from Nasopharyngeal swab specimens and should not be used as a sole basis for treatment. Nasal washings and aspirates are unacceptable for Xpert Xpress SARS-CoV-2/FLU/RSV testing.  Fact Sheet for Patients: BloggerCourse.com  Fact Sheet for Healthcare Providers: SeriousBroker.it  This test is not yet approved or cleared by the [place] FDA and has been authorized for detection and/or diagnosis of SARS-CoV-2 by FDA under an Emergency Use Authorization (EUA). This EUA will remain in effect (meaning  this test can be used) for the duration of the COVID-19 declaration under Section 564(b)(1) of the Act, 21 U.S.C. section 360bbb-3(b)(1), unless the authorization is terminated or revoked.  Performed at [facility], [address] [place], [place] [address]   Surgical pcr screen     Status: None   Collection Time: 03/05/21  2:58 AM   Specimen: Nasal Mucosa; Nasal Swab  Result Value Ref Range Status   MRSA, PCR NEGATIVE NEGATIVE Final   Staphylococcus aureus NEGATIVE NEGATIVE Final    Comment: (NOTE) The Xpert SA Assay (FDA approved for NASAL specimens in patients 55 years of age and older), is one component of a comprehensive surveillance program.  It is not intended to diagnose infection nor to guide or monitor treatment. Performed at [facility], [address] [place], [place] [address]          Radiology Studies: DG Abd 1 View  Result Date: 03/04/2021 CLINICAL DATA:  Small-bowel obstruction, vomiting EXAM: ABDOMEN - 1 VIEW COMPARISON:  03/04/2021, 10:25 a.m. FINDINGS: Interval administration of enteric contrast, which is present in the gastric fundus and lower esophagus. There is dilute contrast present in distended loops of mid small bowel measuring up to 5.1 cm in caliber. There is a moderate burden of gas and stool throughout the colon to the rectum. No obvious free air. Esophagogastric tube remains with tip and side port below the diaphragm. IMPRESSION: 1. Interval administration of enteric contrast, which is present in the gastric fundus and lower esophagus. There is dilute contrast present in distended loops of mid small bowel measuring up to 5.1 cm in caliber. Findings remain consistent with small bowel obstruction. 2. There is a moderate burden of gas and stool throughout the colon to the rectum. Electronically Signed   By: Jearld Lesch M.D.   On: 03/04/2021 13:50   CT ABDOMEN PELVIS W CONTRAST  Result Date: 03/04/2021 CLINICAL DATA:  Abdominal pain. EXAM: CT ABDOMEN AND PELVIS WITH CONTRAST TECHNIQUE: Multidetector CT imaging of the abdomen and pelvis was performed using the standard protocol following bolus administration of intravenous contrast. CONTRAST:  OMNIPAQUE IOHEXOL 300 MG/ML  SOLN COMPARISON:  None. FINDINGS: Lower chest: No acute abnormality. Hepatobiliary: There is diffuse fatty infiltration of the liver parenchyma. No focal liver abnormality is seen. No gallstones, gallbladder wall thickening, or biliary dilatation. Pancreas: Unremarkable. No pancreatic ductal dilatation or surrounding inflammatory changes. Spleen: Normal in size without focal abnormality. Adrenals/Urinary Tract: Adrenal glands are  unremarkable. Kidneys are normal, without renal calculi, focal lesion, or hydronephrosis. Bladder is unremarkable. Stomach/Bowel: There is a small hiatal hernia. Appendix appears normal. Multiple dilated small bowel loops are seen within the mid and lower left abdomen. A transition zone is suspected within the pelvis on the left (axial CT images 57 through 62, CT series 3). Vascular/Lymphatic: Aortic atherosclerosis. No enlarged abdominal or pelvic lymph nodes. Reproductive: Prostate is unremarkable. Other: No abdominal wall hernia or abnormality. No abdominopelvic ascites. Musculoskeletal: No acute or significant osseous findings. IMPRESSION: 1. Small-bowel obstruction with a transition zone noted within the pelvis on the left. 2. Hepatic steatosis. 3. Aortic atherosclerosis. Aortic Atherosclerosis (ICD10-I70.0). Electronically Signed   By: Aram Candela M.D.   On: 03/04/2021 04:04   DG Abd Portable 1V  Result Date: 03/05/2021 CLINICAL DATA:  Nasogastric tube placement EXAM: PORTABLE ABDOMEN - 1 VIEW COMPARISON:  Yesterday FINDINGS: Shorter enteric tube with tip at the stomach and side-port over the lower esophagus. For the side port to  reach the stomach would require approximately 7 cm of advancement. Partial coverage of the abdomen shows continued small bowel distension centrally. Clear lung bases. IMPRESSION: Shorter enteric tube with tip at the stomach and side port at the lower esophagus. Electronically Signed   By: Tiburcio Pea M.D.   On: 03/05/2021 05:53   DG Abd Portable 1V-Small Bowel Obstruction Protocol-initial, 8 hr delay  Result Date: 03/04/2021 CLINICAL DATA:  Small bowel obstruction. EXAM: PORTABLE ABDOMEN - 1 VIEW COMPARISON:  Abdominal radiograph dated 03/04/2021. FINDINGS: Enteric tube with tip in the distal stomach. Persistent dilatation of small-bowel loops measuring up to 3.7 cm in caliber. There is a rounded lucency in the left lower abdomen which is only seen on 1 of the  images and may be artifactual. Free air is less likely but not excluded. Lateral decubitus images recommended for better evaluation. Degenerative changes of the spine. No acute osseous pathology. IMPRESSION: 1. Enteric tube with tip in the distal stomach. 2. Persistent dilatation of small-bowel loops. 3. Artifact versus less likely free air. Further evaluation with lateral decubitus radiographs recommended. Electronically Signed   By: Elgie Collard M.D.   On: 03/04/2021 22:14   DG Abd Portable 1V  Result Date: 03/04/2021 CLINICAL DATA:  Nasogastric tube placement. EXAM: PORTABLE ABDOMEN - 1 VIEW COMPARISON:  March 04, 2021 (1:35 p.m.) FINDINGS: A nasogastric tube is seen with its distal tip overlying the region near the gastric antrum. Multiple dilated small bowel loops are again noted. No radio-opaque calculi or other significant radiographic abnormality are seen. IMPRESSION: Nasogastric tube positioning, as described above. Electronically Signed   By: Aram Candela M.D.   On: 03/04/2021 19:52   DG Abd Portable 1V-Small Bowel Protocol-Position Verification  Result Date: 03/04/2021 CLINICAL DATA:  NG tube placement EXAM: PORTABLE ABDOMEN - 1 VIEW COMPARISON:  Same day CT. FINDINGS: There is a nasogastric tube with tip and side port overlying the stomach. Multiple dilated loops of small bowel in the upper abdomen. IMPRESSION: Nasogastric tube tip and side port overlie the stomach. Small bowel obstruction. Electronically Signed   By: Caprice Renshaw M.D.   On: 03/04/2021 10:31        Scheduled Meds:  [MAR Hold] allopurinol  100 mg Oral Daily   [MAR Hold] empagliflozin  10 mg Oral q morning   [MAR Hold] gabapentin  300 mg Oral TID   [MAR Hold] heparin  5,000 Units Subcutaneous Q8H   [MAR Hold] insulin aspart  0-20 Units Subcutaneous TID WC   [MAR Hold] tamsulosin  0.4 mg Oral QHS   Continuous Infusions:  sodium chloride 75 mL/hr at 03/05/21 0429   lactated ringers 10 mL/hr at 03/05/21  0906     LOS: 1 day    Time spent: 25 minutes    Dorcas Carrow, MD [facility] Pager [phone]

## 2021-03-05 NOTE — Op Note (Signed)
Operative Note  Daniel Perkins  [id number]  [id number]  03/05/2021   Surgeon: Berna Bue MD   Assistant: Patrici Ranks RNFA   Procedure performed: Diagnostic laparoscopy, lysis of single adhesive band in the left lower quadrant with resolution of bowel obstruction, mini laparotomy with primary repair of 2 mm small bowel perforation   Preop diagnosis: Small bowel obstruction Post-op diagnosis/intraop findings: Same, due to a single adhesive band in the left lower quadrant from a sigmoid epiploic appendage   Specimens: no Retained items: no  EBL: Minimal cc Complications: none   Description of procedure: After obtaining informed consent the patient was taken to the operating room and placed supine on operating room table where general endotracheal anesthesia was initiated, preoperative antibiotics were administered, SCDs applied, and a formal timeout was performed.  The abdomen was prepped and draped in usual sterile fashion.  Peritoneal access was gained with an optical entry in the left upper quadrant and insufflation to 15 mmHg ensued without incident.  There was no injury from our entry.  There was dusky appearing bowel in the left hemiabdomen which was quite dilated and consistent with impending or early ischemia.  4 additional 5 mm trochars were placed on the right and left hemiabdomen and the small bowel was carefully manipulated until the point of obstruction was identified in the left lower quadrant where there was a thick band between the sigmoid epiploic appendage and the small bowel mesentery.  This was lysed sharply with relief of the obstruction.  I then identified the ileocecal valve and proceeded to run the bowel from distal to proximal.  While doing so we did identify a very small perforation which was about 2 to 3 mm in size along the mesenteric border of the small bowel.  A small upper midline laparotomy was created in this section of bowel delivered into the field  where this perforation was repaired with simple interrupted full-thickness 3-0 Vicryls and then the repair was imbricated with additional seromuscular 3-0 Vicryl's on completion there was no leak of succus from this and the bowel lumen remained patent.  We then ran the remainder of the bowel.  Beginning with the ligament of Treitz, the small bowel was followed all the way to the ileocecal valve and confirmed to be free of any other obstruction.  The distal decompressed bowel was already beginning to fill in the previously ischemic appearing segment rapidly improved and was completely viable though still somewhat injected.  The bowel was returned to the abdominal cavity which was then irrigated with 3 L of warm sterile saline and the effluent was clear.  The midline fascia was closed with running looped #1 PDS starting at either end and tying centrally.  The abdomen was then reinsufflated and the closure inspected and confirmed to be airtight and free of any entrapped structures.  The bowel was again viable appearing in the area of the sigmoid colon where the adhesion was lysed also appeared free of injury.  The abdomen was then desufflated and the remaining trochars removed.  Skin incisions were all closed with staples and sterile dressings were applied. The patient was then awakened, extubated and taken to PACU in stable condition.    All counts were correct at the completion of the case.

## 2021-03-05 NOTE — Progress Notes (Signed)
Day of Surgery   Subjective/Chief Complaint: Not having much pain, No bowel function.    Objective: Vital signs in last 24 hours: Temp:  [97.6 F (36.4 C)-99.1 F (37.3 C)] 98.1 F (36.7 C) (10/28 0904) Pulse Rate:  [73-97] 97 (10/28 0904) Resp:  [16-22] 18 (10/28 0904) BP: (113-164)/(73-110) 137/87 (10/28 0904) SpO2:  [91 %-99 %] 98 % (10/28 0904) Last BM Date: 03/03/21  Intake/Output from previous day: 10/27 0701 - 10/28 0700 In: 1066.8 [I.V.:1066.8] Out: 1150 [Urine:1050; Emesis/NG output:100] Intake/Output this shift: Total I/O In: -  Out: 400 [Urine:400]  A&Ox4 Unlabored Abdomen soft, distended  Lab Results:  Recent Labs    03/04/21 0313  WBC 9.2  HGB 13.0  HCT 41.5  PLT 347   BMET Recent Labs    03/04/21 0313 03/05/21 0307  NA 131* 133*  K 4.1 4.1  CL 101 100  CO2 20* 20*  GLUCOSE 168* 169*  BUN 20 24*  CREATININE 1.19 1.39*  CALCIUM 9.5 9.2   PT/INR No results for input(s): LABPROT, INR in the last 72 hours. ABG No results for input(s): PHART, HCO3 in the last 72 hours.  Invalid input(s): PCO2, PO2  Studies/Results: DG Abd 1 View  Result Date: 03/04/2021 CLINICAL DATA:  Small-bowel obstruction, vomiting EXAM: ABDOMEN - 1 VIEW COMPARISON:  03/04/2021, 10:25 a.m. FINDINGS: Interval administration of enteric contrast, which is present in the gastric fundus and lower esophagus. There is dilute contrast present in distended loops of mid small bowel measuring up to 5.1 cm in caliber. There is a moderate burden of gas and stool throughout the colon to the rectum. No obvious free air. Esophagogastric tube remains with tip and side port below the diaphragm. IMPRESSION: 1. Interval administration of enteric contrast, which is present in the gastric fundus and lower esophagus. There is dilute contrast present in distended loops of mid small bowel measuring up to 5.1 cm in caliber. Findings remain consistent with small bowel obstruction. 2. There is a  moderate burden of gas and stool throughout the colon to the rectum. Electronically Signed   By: Jearld Lesch M.D.   On: 03/04/2021 13:50   CT ABDOMEN PELVIS W CONTRAST  Result Date: 03/04/2021 CLINICAL DATA:  Abdominal pain. EXAM: CT ABDOMEN AND PELVIS WITH CONTRAST TECHNIQUE: Multidetector CT imaging of the abdomen and pelvis was performed using the standard protocol following bolus administration of intravenous contrast. CONTRAST:  OMNIPAQUE IOHEXOL 300 MG/ML  SOLN COMPARISON:  None. FINDINGS: Lower chest: No acute abnormality. Hepatobiliary: There is diffuse fatty infiltration of the liver parenchyma. No focal liver abnormality is seen. No gallstones, gallbladder wall thickening, or biliary dilatation. Pancreas: Unremarkable. No pancreatic ductal dilatation or surrounding inflammatory changes. Spleen: Normal in size without focal abnormality. Adrenals/Urinary Tract: Adrenal glands are unremarkable. Kidneys are normal, without renal calculi, focal lesion, or hydronephrosis. Bladder is unremarkable. Stomach/Bowel: There is a small hiatal hernia. Appendix appears normal. Multiple dilated small bowel loops are seen within the mid and lower left abdomen. A transition zone is suspected within the pelvis on the left (axial CT images 57 through 62, CT series 3). Vascular/Lymphatic: Aortic atherosclerosis. No enlarged abdominal or pelvic lymph nodes. Reproductive: Prostate is unremarkable. Other: No abdominal wall hernia or abnormality. No abdominopelvic ascites. Musculoskeletal: No acute or significant osseous findings. IMPRESSION: 1. Small-bowel obstruction with a transition zone noted within the pelvis on the left. 2. Hepatic steatosis. 3. Aortic atherosclerosis. Aortic Atherosclerosis (ICD10-I70.0). Electronically Signed   By: Demetrius Revel.D.  On: 03/04/2021 04:04   DG Abd Portable 1V  Result Date: 03/05/2021 CLINICAL DATA:  Nasogastric tube placement EXAM: PORTABLE ABDOMEN - 1 VIEW  COMPARISON:  Yesterday FINDINGS: Shorter enteric tube with tip at the stomach and side-port over the lower esophagus. For the side port to reach the stomach would require approximately 7 cm of advancement. Partial coverage of the abdomen shows continued small bowel distension centrally. Clear lung bases. IMPRESSION: Shorter enteric tube with tip at the stomach and side port at the lower esophagus. Electronically Signed   By: Tiburcio Pea M.D.   On: 03/05/2021 05:53   DG Abd Portable 1V-Small Bowel Obstruction Protocol-initial, 8 hr delay  Result Date: 03/04/2021 CLINICAL DATA:  Small bowel obstruction. EXAM: PORTABLE ABDOMEN - 1 VIEW COMPARISON:  Abdominal radiograph dated 03/04/2021. FINDINGS: Enteric tube with tip in the distal stomach. Persistent dilatation of small-bowel loops measuring up to 3.7 cm in caliber. There is a rounded lucency in the left lower abdomen which is only seen on 1 of the images and may be artifactual. Free air is less likely but not excluded. Lateral decubitus images recommended for better evaluation. Degenerative changes of the spine. No acute osseous pathology. IMPRESSION: 1. Enteric tube with tip in the distal stomach. 2. Persistent dilatation of small-bowel loops. 3. Artifact versus less likely free air. Further evaluation with lateral decubitus radiographs recommended. Electronically Signed   By: Elgie Collard M.D.   On: 03/04/2021 22:14   DG Abd Portable 1V  Result Date: 03/04/2021 CLINICAL DATA:  Nasogastric tube placement. EXAM: PORTABLE ABDOMEN - 1 VIEW COMPARISON:  March 04, 2021 (1:35 p.m.) FINDINGS: A nasogastric tube is seen with its distal tip overlying the region near the gastric antrum. Multiple dilated small bowel loops are again noted. No radio-opaque calculi or other significant radiographic abnormality are seen. IMPRESSION: Nasogastric tube positioning, as described above. Electronically Signed   By: Aram Candela M.D.   On: 03/04/2021 19:52   DG  Abd Portable 1V-Small Bowel Protocol-Position Verification  Result Date: 03/04/2021 CLINICAL DATA:  NG tube placement EXAM: PORTABLE ABDOMEN - 1 VIEW COMPARISON:  Same day CT. FINDINGS: There is a nasogastric tube with tip and side port overlying the stomach. Multiple dilated loops of small bowel in the upper abdomen. IMPRESSION: Nasogastric tube tip and side port overlie the stomach. Small bowel obstruction. Electronically Signed   By: Caprice Renshaw M.D.   On: 03/04/2021 10:31    Anti-infectives: Anti-infectives (From admission, onward)    Start     Dose/Rate Route Frequency Ordered Stop   03/05/21 0845  cefoTEtan (CEFOTAN) 2 g in sodium chloride 0.9 % 100 mL IVPB        2 g 200 mL/hr over 30 Minutes Intravenous To [facility] 03/04/21 2206 03/06/21 0845       Assessment/Plan:  SBO - no past abdominal surgical history - CT scan shows Small-bowel obstruction with a transition zone noted within the pelvis on the left - Recommend diagnostic laparoscopy, possible laparotomy and possible bowel resection. Discussed with patient including risks of bleeding, infection, pain, scarring, intraabdominal injury, need for further procedures, ileus, wound healing issues.hernia, prolonged hospitalization and recovery. Questions answered to his satisfaction. He wishes to proceed with surgery.    ID - none VTE - SCDs, sq heparin FEN - IVF, NPO/NGT to LIWS Foley - none   DM HTN CAD h/o MI several years ago - followed by Dr. Eldridge Dace PAD HLD Chronic neck pain   LOS: 1 day  Oneda Duffett A Fredricka Bonine 03/05/2021

## 2021-03-05 NOTE — Progress Notes (Signed)
All lines changed connected to NG tube to try and promote output, but continued to not have significant drainage. Per the prior imaging, the tube appears to have proper placement. The patient complained he felt like he was going to vomit. I discussed with him that the best and only remaining option was to replace the NG tube since it appears to be a defect with the tube. The patient was agreeable. NG tube was placed to the left nare at 21.5 cm. Awaiting repeat imaging to verify placement. Will continue to monitor.

## 2021-03-06 ENCOUNTER — Encounter (HOSPITAL_COMMUNITY): Payer: Self-pay | Admitting: Surgery

## 2021-03-06 DIAGNOSIS — I10 Essential (primary) hypertension: Secondary | ICD-10-CM | POA: Diagnosis not present

## 2021-03-06 DIAGNOSIS — E785 Hyperlipidemia, unspecified: Secondary | ICD-10-CM | POA: Diagnosis not present

## 2021-03-06 DIAGNOSIS — E0865 Diabetes mellitus due to underlying condition with hyperglycemia: Secondary | ICD-10-CM | POA: Diagnosis not present

## 2021-03-06 LAB — GLUCOSE, CAPILLARY
Glucose-Capillary: 120 mg/dL — ABNORMAL HIGH (ref 70–99)
Glucose-Capillary: 133 mg/dL — ABNORMAL HIGH (ref 70–99)
Glucose-Capillary: 154 mg/dL — ABNORMAL HIGH (ref 70–99)
Glucose-Capillary: 98 mg/dL (ref 70–99)
Glucose-Capillary: 99 mg/dL (ref 70–99)

## 2021-03-06 LAB — CBC
HCT: 39 % (ref 39.0–52.0)
Hemoglobin: 12 g/dL — ABNORMAL LOW (ref 13.0–17.0)
MCH: 22.3 pg — ABNORMAL LOW (ref 26.0–34.0)
MCHC: 30.8 g/dL (ref 30.0–36.0)
MCV: 72.4 fL — ABNORMAL LOW (ref 80.0–100.0)
Platelets: 286 10*3/uL (ref 150–400)
RBC: 5.39 MIL/uL (ref 4.22–5.81)
RDW: 15 % (ref 11.5–15.5)
WBC: 11.5 10*3/uL — ABNORMAL HIGH (ref 4.0–10.5)
nRBC: 0 % (ref 0.0–0.2)

## 2021-03-06 LAB — BASIC METABOLIC PANEL
Anion gap: 8 (ref 5–15)
BUN: 32 mg/dL — ABNORMAL HIGH (ref 8–23)
CO2: 24 mmol/L (ref 22–32)
Calcium: 8.4 mg/dL — ABNORMAL LOW (ref 8.9–10.3)
Chloride: 103 mmol/L (ref 98–111)
Creatinine, Ser: 1.57 mg/dL — ABNORMAL HIGH (ref 0.61–1.24)
GFR, Estimated: 49 mL/min — ABNORMAL LOW (ref >60)
Glucose, Bld: 139 mg/dL — ABNORMAL HIGH (ref 70–99)
Potassium: 4.1 mmol/L (ref 3.5–5.1)
Sodium: 135 mmol/L (ref 135–145)

## 2021-03-06 MED ORDER — INFLUENZA VAC SPLIT QUAD 0.5 ML IM SUSY
0.5000 mL | PREFILLED_SYRINGE | INTRAMUSCULAR | Status: AC
  Administered 2021-03-09: 0.5 mL via INTRAMUSCULAR
  Filled 2021-03-06: qty 0.5

## 2021-03-06 NOTE — Progress Notes (Signed)
Progress Note  Patient Name: Daniel Perkins Date of Encounter: 03/06/2021  Primary Cardiologist: Lance Muss, MD   Subjective   In interval had surgery without cardiac complications.  Notes that he his hungry and homes he will start passing gas soon.  No CP SOB.    Inpatient Medications    Scheduled Meds:  heparin  5,000 Units Subcutaneous Q8H   [START ON 03/07/2021] influenza vac split quadrivalent PF  0.5 mL Intramuscular Tomorrow-1000   insulin aspart  0-20 Units Subcutaneous TID WC   lidocaine  1 patch Transdermal Q24H   Continuous Infusions:  sodium chloride Stopped (03/06/21 0516)   acetaminophen 1,000 mg (03/06/21 0630)   methocarbamol (ROBAXIN) IV 100 mL/hr at 03/06/21 0519   PRN Meds: HYDROmorphone (DILAUDID) injection, labetalol, morphine injection, nitroGLYCERIN, ondansetron (ZOFRAN) IV   Vital Signs    Vitals:   03/05/21 2349 03/06/21 0038 03/06/21 0423 03/06/21 0815  BP: (!) 94/58 96/60 103/68 102/66  Pulse: (!) 105 95 99 87  Resp: 14 16 16 17   Temp: 99.5 F (37.5 C) 99.5 F (37.5 C) 98.4 F (36.9 C) 98.2 F (36.8 C)  TempSrc: Oral Oral Oral Oral  SpO2: 96% 98% 97% 96%  Weight:      Height:        Intake/Output Summary (Last 24 hours) at 03/06/2021 1102 Last data filed at 03/06/2021 1042 Gross per 24 hour  Intake 3056.91 ml  Output 950 ml  Net 2106.91 ml   Filed Weights   03/04/21 0256  Weight: 94.8 kg    Telemetry    None - Personally Reviewed  ECG    No new- EKG from ED has not been scanned in - Personally Reviewed  Physical Exam   GEN: No acute distress.   Neck: No JVD Cardiac: RRR, no murmurs, rubs, or gallops.  Respiratory: Clear to auscultation bilaterally. GI: Soft, slightly tender without bowel sounds  MS: No edema; No deformity. Neuro:  Nonfocal  Psych: Normal affect   Labs    Chemistry Recent Labs  Lab 03/04/21 0313 03/05/21 0307 03/06/21 0858  NA 131* 133* 135  K 4.1 4.1 4.1  CL 101 100 103   CO2 20* 20* 24  GLUCOSE 168* 169* 139*  BUN 20 24* 32*  CREATININE 1.19 1.39* 1.57*  CALCIUM 9.5 9.2 8.4*  PROT 8.0  --   --   ALBUMIN 3.5  --   --   AST 20  --   --   ALT 14  --   --   ALKPHOS 93  --   --   BILITOT 1.0  --   --   GFRNONAA >60 57* 49*  ANIONGAP 10 13 8      Hematology Recent Labs  Lab 03/04/21 0313 03/06/21 0858  WBC 9.2 11.5*  RBC 5.73 5.39  HGB 13.0 12.0*  HCT 41.5 39.0  MCV 72.4* 72.4*  MCH 22.7* 22.3*  MCHC 31.3 30.8  RDW 16.5* 15.0  PLT 347 286    Cardiac EnzymesNo results for input(s): TROPONINI in the last 168 hours. No results for input(s): TROPIPOC in the last 168 hours.   BNPNo results for input(s): BNP, PROBNP in the last 168 hours.   DDimer No results for input(s): DDIMER in the last 168 hours.   Radiology    DG Abd 1 View  Result Date: 03/04/2021 CLINICAL DATA:  Small-bowel obstruction, vomiting EXAM: ABDOMEN - 1 VIEW COMPARISON:  03/04/2021, 10:25 a.m. FINDINGS: Interval administration of enteric contrast, which is present  in the gastric fundus and lower esophagus. There is dilute contrast present in distended loops of mid small bowel measuring up to 5.1 cm in caliber. There is a moderate burden of gas and stool throughout the colon to the rectum. No obvious free air. Esophagogastric tube remains with tip and side port below the diaphragm. IMPRESSION: 1. Interval administration of enteric contrast, which is present in the gastric fundus and lower esophagus. There is dilute contrast present in distended loops of mid small bowel measuring up to 5.1 cm in caliber. Findings remain consistent with small bowel obstruction. 2. There is a moderate burden of gas and stool throughout the colon to the rectum. Electronically Signed   By: Jearld Lesch M.D.   On: 03/04/2021 13:50   DG Abd Portable 1V  Result Date: 03/05/2021 CLINICAL DATA:  Nasogastric tube placement EXAM: PORTABLE ABDOMEN - 1 VIEW COMPARISON:  Yesterday FINDINGS: Shorter enteric tube  with tip at the stomach and side-port over the lower esophagus. For the side port to reach the stomach would require approximately 7 cm of advancement. Partial coverage of the abdomen shows continued small bowel distension centrally. Clear lung bases. IMPRESSION: Shorter enteric tube with tip at the stomach and side port at the lower esophagus. Electronically Signed   By: Tiburcio Pea M.D.   On: 03/05/2021 05:53   DG Abd Portable 1V-Small Bowel Obstruction Protocol-initial, 8 hr delay  Result Date: 03/04/2021 CLINICAL DATA:  Small bowel obstruction. EXAM: PORTABLE ABDOMEN - 1 VIEW COMPARISON:  Abdominal radiograph dated 03/04/2021. FINDINGS: Enteric tube with tip in the distal stomach. Persistent dilatation of small-bowel loops measuring up to 3.7 cm in caliber. There is a rounded lucency in the left lower abdomen which is only seen on 1 of the images and may be artifactual. Free air is less likely but not excluded. Lateral decubitus images recommended for better evaluation. Degenerative changes of the spine. No acute osseous pathology. IMPRESSION: 1. Enteric tube with tip in the distal stomach. 2. Persistent dilatation of small-bowel loops. 3. Artifact versus less likely free air. Further evaluation with lateral decubitus radiographs recommended. Electronically Signed   By: Elgie Collard M.D.   On: 03/04/2021 22:14   DG Abd Portable 1V  Result Date: 03/04/2021 CLINICAL DATA:  Nasogastric tube placement. EXAM: PORTABLE ABDOMEN - 1 VIEW COMPARISON:  March 04, 2021 (1:35 p.m.) FINDINGS: A nasogastric tube is seen with its distal tip overlying the region near the gastric antrum. Multiple dilated small bowel loops are again noted. No radio-opaque calculi or other significant radiographic abnormality are seen. IMPRESSION: Nasogastric tube positioning, as described above. Electronically Signed   By: Aram Candela M.D.   On: 03/04/2021 19:52     Patient Profile     65 y.o. 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   Medication Recommendations:  ASA, Statin, and anti-hypertensives as above Other recommendations (labs, testing, etc):  PFTs for consideration as outpatient Follow up as an outpatient:  has 03/30/21 follow up Appt  For questions or updates, please contact [facility] Please  consult [email or web address] for contact info under Cardiology/STEMI.      Signed, Christell Constant, MD  03/06/2021, 11:02 AM

## 2021-03-06 NOTE — Plan of Care (Signed)
  Problem: Education: Goal: Knowledge of General Education information will improve Description Including pain rating scale, medication(s)/side effects and non-pharmacologic comfort measures Outcome: Progressing   

## 2021-03-06 NOTE — Progress Notes (Signed)
1 Day Post-Op   Subjective/Chief Complaint: Soreness in abdominal wall. No flatus or bowel movements. No nausea/vomiting with NG to suction with clearing output   Objective: Vital signs in last 24 hours: Temp:  [97.8 F (36.6 C)-99.5 F (37.5 C)] 98.2 F (36.8 C) (10/29 0815) Pulse Rate:  [77-105] 87 (10/29 0815) Resp:  [14-23] 17 (10/29 0815) BP: (88-137)/(58-87) 102/66 (10/29 0815) SpO2:  [95 %-98 %] 96 % (10/29 0815) Last BM Date: 03/03/21  Intake/Output from previous day: 10/28 0701 - 10/29 0700 In: 3156.9 [I.V.:2952.8; IV Piggyback:204.1] Out: 1050 [Urine:700; Emesis/NG output:300; Blood:50] Intake/Output this shift: No intake/output data recorded.  A&Ox4 Unlabored Abdomen soft, incisions c/d/I w/ tenderness around incisions.  Lab Results:  Recent Labs    03/04/21 0313  WBC 9.2  HGB 13.0  HCT 41.5  PLT 347    BMET Recent Labs    03/04/21 0313 03/05/21 0307  NA 131* 133*  K 4.1 4.1  CL 101 100  CO2 20* 20*  GLUCOSE 168* 169*  BUN 20 24*  CREATININE 1.19 1.39*  CALCIUM 9.5 9.2    PT/INR No results for input(s): LABPROT, INR in the last 72 hours. ABG No results for input(s): PHART, HCO3 in the last 72 hours.  Invalid input(s): PCO2, PO2  Studies/Results: DG Abd 1 View  Result Date: 03/04/2021 CLINICAL DATA:  Small-bowel obstruction, vomiting EXAM: ABDOMEN - 1 VIEW COMPARISON:  03/04/2021, 10:25 a.m. FINDINGS: Interval administration of enteric contrast, which is present in the gastric fundus and lower esophagus. There is dilute contrast present in distended loops of mid small bowel measuring up to 5.1 cm in caliber. There is a moderate burden of gas and stool throughout the colon to the rectum. No obvious free air. Esophagogastric tube remains with tip and side port below the diaphragm. IMPRESSION: 1. Interval administration of enteric contrast, which is present in the gastric fundus and lower esophagus. There is dilute contrast present in  distended loops of mid small bowel measuring up to 5.1 cm in caliber. Findings remain consistent with small bowel obstruction. 2. There is a moderate burden of gas and stool throughout the colon to the rectum. Electronically Signed   By: Jearld Lesch M.D.   On: 03/04/2021 13:50   DG Abd Portable 1V  Result Date: 03/05/2021 CLINICAL DATA:  Nasogastric tube placement EXAM: PORTABLE ABDOMEN - 1 VIEW COMPARISON:  Yesterday FINDINGS: Shorter enteric tube with tip at the stomach and side-port over the lower esophagus. For the side port to reach the stomach would require approximately 7 cm of advancement. Partial coverage of the abdomen shows continued small bowel distension centrally. Clear lung bases. IMPRESSION: Shorter enteric tube with tip at the stomach and side port at the lower esophagus. Electronically Signed   By: Tiburcio Pea M.D.   On: 03/05/2021 05:53   DG Abd Portable 1V-Small Bowel Obstruction Protocol-initial, 8 hr delay  Result Date: 03/04/2021 CLINICAL DATA:  Small bowel obstruction. EXAM: PORTABLE ABDOMEN - 1 VIEW COMPARISON:  Abdominal radiograph dated 03/04/2021. FINDINGS: Enteric tube with tip in the distal stomach. Persistent dilatation of small-bowel loops measuring up to 3.7 cm in caliber. There is a rounded lucency in the left lower abdomen which is only seen on 1 of the images and may be artifactual. Free air is less likely but not excluded. Lateral decubitus images recommended for better evaluation. Degenerative changes of the spine. No acute osseous pathology. IMPRESSION: 1. Enteric tube with tip in the distal stomach. 2. Persistent dilatation of small-bowel  loops. 3. Artifact versus less likely free air. Further evaluation with lateral decubitus radiographs recommended. Electronically Signed   By: Elgie Collard M.D.   On: 03/04/2021 22:14   DG Abd Portable 1V  Result Date: 03/04/2021 CLINICAL DATA:  Nasogastric tube placement. EXAM: PORTABLE ABDOMEN - 1 VIEW COMPARISON:   March 04, 2021 (1:35 p.m.) FINDINGS: A nasogastric tube is seen with its distal tip overlying the region near the gastric antrum. Multiple dilated small bowel loops are again noted. No radio-opaque calculi or other significant radiographic abnormality are seen. IMPRESSION: Nasogastric tube positioning, as described above. Electronically Signed   By: Aram Candela M.D.   On: 03/04/2021 19:52   DG Abd Portable 1V-Small Bowel Protocol-Position Verification  Result Date: 03/04/2021 CLINICAL DATA:  NG tube placement EXAM: PORTABLE ABDOMEN - 1 VIEW COMPARISON:  Same day CT. FINDINGS: There is a nasogastric tube with tip and side port overlying the stomach. Multiple dilated loops of small bowel in the upper abdomen. IMPRESSION: Nasogastric tube tip and side port overlie the stomach. Small bowel obstruction. Electronically Signed   By: Caprice Renshaw M.D.   On: 03/04/2021 10:31    Anti-infectives: Anti-infectives (From admission, onward)    Start     Dose/Rate Route Frequency Ordered Stop   03/05/21 0845  cefoTEtan (CEFOTAN) 2 g in sodium chloride 0.9 % 100 mL IVPB        2 g 200 mL/hr over 30 Minutes Intravenous To [facility] 03/04/21 2206 03/05/21 1035       Assessment/Plan:  SBO with no previous abdominal surgical history s/p diagnostic laparoscopy, lysis of single adhesive band in the left lower quadrant with resolution of bowel obstruction, mini laparotomy with primary repair of 2 mm small bowel perforation on 03/05/21.  - Doing fine postoperative day 1, some soft BP overnight - AM labs pending - Increase acitivty - continue NG to LIWS till return of bowel function - Maintenance IVF   ID - none VTE - SCDs, sq heparin FEN - IVF, NPO/NGT to LIWS Foley - none   DM HTN CAD h/o MI several years ago - followed by Dr. Eldridge Dace PAD HLD Chronic neck pain   LOS: 2 days    Daniel Perkins 03/06/2021

## 2021-03-06 NOTE — Progress Notes (Signed)
PROGRESS NOTE    Daniel Perkins  ESP:[id number] DOB: [birth year] DOA: 03/04/2021 PCP: Ellyn Hack, MD    Brief Narrative:  65 year old gentleman with history of hypertension, type 2 diabetes, coronary artery disease, peripheral artery disease hyperlipidemia presented with acute onset abdominal pain and found to have small bowel obstruction with transition zone on the left side of the pelvis.  Admitted with surgical consultation.  Seen by cardiology for preop clearance  Underwent ex lap, small bowel perforation repair on 10/28.  Assessment & Plan:   Active Problems:   Diabetes mellitus due to underlying condition, uncontrolled, with hyperglycemia (HCC)   Hyperlipemia   HYPERTENSION   Gout   SBO (small bowel obstruction) (HCC)  Small bowel obstruction due to adhesions and small bowel perforation:  NPO.  NG tube drained.  IV fluids.  Adequate pain medications.  Mobility.   Postop management as per surgery.    Type 2 diabetes: On Victoza and insulin as well as Jardiance at home.  Insulin continued.  We will keep on lower doses of sliding scale insulin while NPO.  Blood sugars are adequate.  Essential hypertension blood pressures well controlled.  Currently unable to take medications by mouth.  On labetalol as needed.  Will resume antihypertensive after surgery.  History of coronary artery disease: Aspirin on hold.  Will resume on discharge.  Hyperlipidemia: On a statin.  Will resume when he is able to take by mouth.  Acute kidney injury: Due to #1.  Urine output is adequate.  Continue maintenance IV fluids today.  Recheck levels tomorrow morning.  Not on any nephrotoxins.   DVT prophylaxis: heparin injection 5,000 Units Start: 03/04/21 0730   Code Status: Full code Family Communication: None. Disposition Plan: Status is: Inpatient  Remains inpatient appropriate because: Immediate postop.  IV fluids and IV pain medications.  NPO.         Consultants:  General  surgery Cardiology  Procedures:  Ex lap 10/28.  Antimicrobials:  None   Subjective:  Patient seen and examined.  Feels hungry.  Denies any nausea however he has NG tube on.  Belly is sore but less bloated.  No flatus or bowel movement.  Objective: Vitals:   03/05/21 2349 03/06/21 0038 03/06/21 0423 03/06/21 0815  BP: (!) 94/58 96/60 103/68 102/66  Pulse: (!) 105 95 99 87  Resp: 14 16 16 17   Temp: 99.5 F (37.5 C) 99.5 F (37.5 C) 98.4 F (36.9 C) 98.2 F (36.8 C)  TempSrc: Oral Oral Oral Oral  SpO2: 96% 98% 97% 96%  Weight:      Height:        Intake/Output Summary (Last 24 hours) at 03/06/2021 1210 Last data filed at 03/06/2021 1042 Gross per 24 hour  Intake 1556.91 ml  Output 900 ml  Net 656.91 ml   Filed Weights   03/04/21 0256  Weight: 94.8 kg    Examination:  General: Looks fairly comfortable.  NG tube actively draining.  On room air. Slightly anxious. Cardiovascular: S1-S2 normal.  No added sounds. Respiratory: Bilateral clear.  No added sounds. Gastrointestinal: Soft.  Mildly distended.  No bowel sounds.  Appropriately tender postop. Ext: No swelling or edema.  No cyanosis.      Data Reviewed: I have personally reviewed following labs and imaging studies  CBC: Recent Labs  Lab 03/04/21 0313 03/06/21 0858  WBC 9.2 11.5*  NEUTROABS 6.6  --   HGB 13.0 12.0*  HCT 41.5 39.0  MCV 72.4* 72.4*  PLT  347 286   Basic Metabolic Panel: Recent Labs  Lab 03/04/21 0313 03/05/21 0307 03/06/21 0858  NA 131* 133* 135  K 4.1 4.1 4.1  CL 101 100 103  CO2 20* 20* 24  GLUCOSE 168* 169* 139*  BUN 20 24* 32*  CREATININE 1.19 1.39* 1.57*  CALCIUM 9.5 9.2 8.4*   GFR: Estimated Creatinine Clearance: 52.2 mL/min (A) (by C-G formula based on SCr of 1.57 mg/dL (H)). Liver Function Tests: Recent Labs  Lab 03/04/21 0313  AST 20  ALT 14  ALKPHOS 93  BILITOT 1.0  PROT 8.0  ALBUMIN 3.5   No results for input(s): LIPASE, AMYLASE in the last 168  hours. No results for input(s): AMMONIA in the last 168 hours. Coagulation Profile: No results for input(s): INR, PROTIME in the last 168 hours. Cardiac Enzymes: No results for input(s): CKTOTAL, CKMB, CKMBINDEX, TROPONINI in the last 168 hours. BNP (last 3 results) No results for input(s): PROBNP in the last 8760 hours. HbA1C: Recent Labs    03/04/21 2228  HGBA1C 6.0*   CBG: Recent Labs  Lab 03/05/21 1707 03/05/21 2353 03/06/21 0553 03/06/21 1012 03/06/21 1125  GLUCAP 192* 185* 154* 133* 120*   Lipid Profile: No results for input(s): CHOL, HDL, LDLCALC, TRIG, CHOLHDL, LDLDIRECT in the last 72 hours. Thyroid Function Tests: No results for input(s): TSH, T4TOTAL, FREET4, T3FREE, THYROIDAB in the last 72 hours. Anemia Panel: No results for input(s): VITAMINB12, FOLATE, FERRITIN, TIBC, IRON, RETICCTPCT in the last 72 hours. Sepsis Labs: Recent Labs  Lab 03/04/21 0313  LATICACIDVEN 1.4    Recent Results (from the past 240 hour(s))  Resp Panel by RT-PCR (Flu A&B, Covid) Nasopharyngeal Swab     Status: None   Collection Time: 03/04/21  7:44 AM   Specimen: Nasopharyngeal Swab; Nasopharyngeal(NP) swabs in vial transport medium  Result Value Ref Range Status   SARS Coronavirus 2 by RT PCR NEGATIVE NEGATIVE Final    Comment: (NOTE) SARS-CoV-2 target nucleic acids are NOT DETECTED.  The SARS-CoV-2 RNA is generally detectable in upper respiratory specimens during the acute phase of infection. The lowest concentration of SARS-CoV-2 viral copies this assay can detect is 138 copies/mL. A negative result does not preclude SARS-Cov-2 infection and should not be used as the sole basis for treatment or other patient management decisions. A negative result may occur with  improper specimen collection/handling, submission of specimen other than nasopharyngeal swab, presence of viral mutation(s) within the areas targeted by this assay, and inadequate number of viral copies(<138  copies/mL). A negative result must be combined with clinical observations, patient history, and epidemiological information. The expected result is Negative.  Fact Sheet for Patients:  BloggerCourse.com  Fact Sheet for Healthcare Providers:  SeriousBroker.it  This test is no t yet approved or cleared by the [place] FDA and  has been authorized for detection and/or diagnosis of SARS-CoV-2 by FDA under an Emergency Use Authorization (EUA). This EUA will remain  in effect (meaning this test can be used) for the duration of the COVID-19 declaration under Section 564(b)(1) of the Act, 21 U.S.C.section 360bbb-3(b)(1), unless the authorization is terminated  or revoked sooner.       Influenza A by PCR NEGATIVE NEGATIVE Final   Influenza B by PCR NEGATIVE NEGATIVE Final    Comment: (NOTE) The Xpert Xpress SARS-CoV-2/FLU/RSV plus assay is intended as an aid in the diagnosis of influenza from Nasopharyngeal swab specimens and should not be used as a sole basis for treatment. Nasal washings and  aspirates are unacceptable for Xpert Xpress SARS-CoV-2/FLU/RSV testing.  Fact Sheet for Patients: BloggerCourse.com  Fact Sheet for Healthcare Providers: SeriousBroker.it  This test is not yet approved or cleared by the [place] FDA and has been authorized for detection and/or diagnosis of SARS-CoV-2 by FDA under an Emergency Use Authorization (EUA). This EUA will remain in effect (meaning this test can be used) for the duration of the COVID-19 declaration under Section 564(b)(1) of the Act, 21 U.S.C. section 360bbb-3(b)(1), unless the authorization is terminated or revoked.  Performed at [facility], [address] [place], [place] [address]   Surgical pcr screen     Status: None   Collection Time: 03/05/21  2:58 AM   Specimen: Nasal Mucosa; Nasal Swab  Result Value Ref  Range Status   MRSA, PCR NEGATIVE NEGATIVE Final   Staphylococcus aureus NEGATIVE NEGATIVE Final    Comment: (NOTE) The Xpert SA Assay (FDA approved for NASAL specimens in patients 36 years of age and older), is one component of a comprehensive surveillance program. It is not intended to diagnose infection nor to guide or monitor treatment. Performed at [facility], [address] [place], [place] [address]          Radiology Studies: DG Abd 1 View  Result Date: 03/04/2021 CLINICAL DATA:  Small-bowel obstruction, vomiting EXAM: ABDOMEN - 1 VIEW COMPARISON:  03/04/2021, 10:25 a.m. FINDINGS: Interval administration of enteric contrast, which is present in the gastric fundus and lower esophagus. There is dilute contrast present in distended loops of mid small bowel measuring up to 5.1 cm in caliber. There is a moderate burden of gas and stool throughout the colon to the rectum. No obvious free air. Esophagogastric tube remains with tip and side port below the diaphragm. IMPRESSION: 1. Interval administration of enteric contrast, which is present in the gastric fundus and lower esophagus. There is dilute contrast present in distended loops of mid small bowel measuring up to 5.1 cm in caliber. Findings remain consistent with small bowel obstruction. 2. There is a moderate burden of gas and stool throughout the colon to the rectum. Electronically Signed   By: Jearld Lesch M.D.   On: 03/04/2021 13:50   DG Abd Portable 1V  Result Date: 03/05/2021 CLINICAL DATA:  Nasogastric tube placement EXAM: PORTABLE ABDOMEN - 1 VIEW COMPARISON:  Yesterday FINDINGS: Shorter enteric tube with tip at the stomach and side-port over the lower esophagus. For the side port to reach the stomach would require approximately 7 cm of advancement. Partial coverage of the abdomen shows continued small bowel distension centrally. Clear lung bases. IMPRESSION: Shorter enteric tube with tip at the stomach and side port  at the lower esophagus. Electronically Signed   By: Tiburcio Pea M.D.   On: 03/05/2021 05:53   DG Abd Portable 1V-Small Bowel Obstruction Protocol-initial, 8 hr delay  Result Date: 03/04/2021 CLINICAL DATA:  Small bowel obstruction. EXAM: PORTABLE ABDOMEN - 1 VIEW COMPARISON:  Abdominal radiograph dated 03/04/2021. FINDINGS: Enteric tube with tip in the distal stomach. Persistent dilatation of small-bowel loops measuring up to 3.7 cm in caliber. There is a rounded lucency in the left lower abdomen which is only seen on 1 of the images and may be artifactual. Free air is less likely but not excluded. Lateral decubitus images recommended for better evaluation. Degenerative changes of the spine. No acute osseous pathology. IMPRESSION: 1. Enteric tube with tip in the distal stomach. 2. Persistent dilatation of small-bowel loops. 3. Artifact versus  less likely free air. Further evaluation with lateral decubitus radiographs recommended. Electronically Signed   By: Elgie Collard M.D.   On: 03/04/2021 22:14   DG Abd Portable 1V  Result Date: 03/04/2021 CLINICAL DATA:  Nasogastric tube placement. EXAM: PORTABLE ABDOMEN - 1 VIEW COMPARISON:  March 04, 2021 (1:35 p.m.) FINDINGS: A nasogastric tube is seen with its distal tip overlying the region near the gastric antrum. Multiple dilated small bowel loops are again noted. No radio-opaque calculi or other significant radiographic abnormality are seen. IMPRESSION: Nasogastric tube positioning, as described above. Electronically Signed   By: Aram Candela M.D.   On: 03/04/2021 19:52        Scheduled Meds:  heparin  5,000 Units Subcutaneous Q8H   [START ON 03/07/2021] influenza vac split quadrivalent PF  0.5 mL Intramuscular Tomorrow-1000   insulin aspart  0-20 Units Subcutaneous TID WC   lidocaine  1 patch Transdermal Q24H   Continuous Infusions:  sodium chloride Stopped (03/06/21 0516)   acetaminophen 1,000 mg (03/06/21 0630)   methocarbamol  (ROBAXIN) IV 100 mL/hr at 03/06/21 0519     LOS: 2 days    Time spent: 25 minutes    Dorcas Carrow, MD [facility] Pager [phone]

## 2021-03-07 DIAGNOSIS — E785 Hyperlipidemia, unspecified: Secondary | ICD-10-CM | POA: Diagnosis not present

## 2021-03-07 DIAGNOSIS — E0865 Diabetes mellitus due to underlying condition with hyperglycemia: Secondary | ICD-10-CM | POA: Diagnosis not present

## 2021-03-07 DIAGNOSIS — I10 Essential (primary) hypertension: Secondary | ICD-10-CM | POA: Diagnosis not present

## 2021-03-07 LAB — GLUCOSE, CAPILLARY
Glucose-Capillary: 104 mg/dL — ABNORMAL HIGH (ref 70–99)
Glucose-Capillary: 125 mg/dL — ABNORMAL HIGH (ref 70–99)
Glucose-Capillary: 130 mg/dL — ABNORMAL HIGH (ref 70–99)
Glucose-Capillary: 98 mg/dL (ref 70–99)

## 2021-03-07 NOTE — Progress Notes (Deleted)
Patient transported via stretcher by EMS to home per family request. Patient's belongings sent home with daughter. AVS reviewed with daughter and interpreter services.

## 2021-03-07 NOTE — Progress Notes (Signed)
2 Days Post-Op   Subjective/Chief Complaint: Incisional pain a little worse today No flatus or bowel movements. No nausea/vomiting with NG to suction with clearing and decreasing output   Objective: Vital signs in last 24 hours: Temp:  [98.7 F (37.1 C)-99.4 F (37.4 C)] 99.3 F (37.4 C) (10/30 0914) Pulse Rate:  [88-96] 96 (10/30 0914) Resp:  [16-22] 22 (10/30 0914) BP: (107-133)/(69-86) 120/77 (10/30 0914) SpO2:  [94 %-100 %] 94 % (10/30 0914) Last BM Date: 03/02/21  Intake/Output from previous day: 10/29 0701 - 10/30 0700 In: 1635.8 [I.V.:1439.8; IV Piggyback:196] Out: 1690 [Urine:1390; Emesis/NG output:300] Intake/Output this shift: Total I/O In: -  Out: 200 [Urine:200]  A&Ox4 Unlabored Abdomen soft, incisions c/d/I w/ tenderness around incisions.  Lab Results:  Recent Labs    03/06/21 0858  WBC 11.5*  HGB 12.0*  HCT 39.0  PLT 286    BMET Recent Labs    03/05/21 0307 03/06/21 0858  NA 133* 135  K 4.1 4.1  CL 100 103  CO2 20* 24  GLUCOSE 169* 139*  BUN 24* 32*  CREATININE 1.39* 1.57*  CALCIUM 9.2 8.4*    PT/INR No results for input(s): LABPROT, INR in the last 72 hours. ABG No results for input(s): PHART, HCO3 in the last 72 hours.  Invalid input(s): PCO2, PO2  Studies/Results: No results found.  Anti-infectives: Anti-infectives (From admission, onward)    Start     Dose/Rate Route Frequency Ordered Stop   03/05/21 0845  cefoTEtan (CEFOTAN) 2 g in sodium chloride 0.9 % 100 mL IVPB        2 g 200 mL/hr over 30 Minutes Intravenous To [facility] 03/04/21 2206 03/05/21 1035       Assessment/Plan:  SBO with no previous abdominal surgical history s/p diagnostic laparoscopy, lysis of single adhesive band in the left lower quadrant with resolution of bowel obstruction, mini laparotomy with primary repair of 2 mm small bowel perforation on 03/05/21.  - Doing fine postoperative day 2, blood pressures improved - Increase acitivty -  continue NG to LIWS till return of bowel function - Maintenance IVF   ID - none VTE - SCDs, sq heparin FEN - IVF, NPO/NGT to LIWS Foley - none   DM HTN CAD h/o MI several years ago - followed by Dr. Eldridge Dace PAD HLD Chronic neck pain   LOS: 3 days    Daniel Perkins 03/07/2021

## 2021-03-07 NOTE — Progress Notes (Signed)
PROGRESS NOTE    Daniel Perkins  YTK:[id number] DOB: 1955-05-22 DOA: 03/04/2021 PCP: Ellyn Hack, MD    Brief Narrative:  65 year old gentleman with history of hypertension, type 2 diabetes, coronary artery disease, peripheral artery disease hyperlipidemia presented with acute onset abdominal pain and found to have small bowel obstruction with transition zone on the left side of the pelvis.  Admitted with surgical consultation.  Seen by cardiology for preop clearance  Underwent ex lap, small bowel perforation repair on 10/28.  Assessment & Plan:   Active Problems:   Diabetes mellitus due to underlying condition, uncontrolled, with hyperglycemia (HCC)   Hyperlipemia   HYPERTENSION   Gout   SBO (small bowel obstruction) (HCC)  Small bowel obstruction due to adhesions and small bowel perforation:  NPO.  NG tube drained.  IV fluids.  Adequate pain medications.  Mobility.   Postop management as per surgery.   Start mobilizing today.  Type 2 diabetes: On Victoza and insulin as well as Jardiance at home.  Insulin continued.  We will keep on lower doses of sliding scale insulin while NPO.  Blood sugars are adequate.  Essential hypertension blood pressures well controlled.  Currently unable to take medications by mouth.  On labetalol as needed.  Will resume antihypertensive after surgery.  History of coronary artery disease: Aspirin on hold.  Will resume on discharge.  Hyperlipidemia: On a statin.  Will resume when he is able to take by mouth.  Acute kidney injury: Due to #1.  Urine output is adequate.  Continue maintenance IV fluids today.  Recheck renal functions tomorrow morning.   DVT prophylaxis: heparin injection 5,000 Units Start: 03/04/21 0730   Code Status: Full code Family Communication: Daughter on the phone. Disposition Plan: Status is: Inpatient  Remains inpatient appropriate because: Immediate postop.  IV fluids and IV pain medications.  NPO.          Consultants:  General surgery Cardiology  Procedures:  Ex lap 10/28.  Antimicrobials:  None   Subjective: Patient seen and examined.  Denies any nausea vomiting.  Slight worse shortness today.  No flatus or bowel movement.  Objective: Vitals:   03/06/21 1621 03/06/21 1943 03/07/21 0413 03/07/21 0914  BP: 108/75 107/69 133/86 120/77  Pulse: 88 94 94 96  Resp: 16 16 17  (!) 22  Temp: 98.7 F (37.1 C) 99.4 F (37.4 C) 99 F (37.2 C) 99.3 F (37.4 C)  TempSrc: Oral Oral Oral Oral  SpO2: 97% 97% 100% 94%  Weight:      Height:        Intake/Output Summary (Last 24 hours) at 03/07/2021 1203 Last data filed at 03/07/2021 0814 Gross per 24 hour  Intake 1635.78 ml  Output 1590 ml  Net 45.78 ml   Filed Weights   03/04/21 0256  Weight: 94.8 kg    Examination:  General: Looks fairly comfortable.  NG tube actively draining.  On room air. Cardiovascular: S1-S2 normal.  No added sounds. Respiratory: Bilateral clear.  No added sounds. Gastrointestinal: Soft.  Mildly distended.  No bowel sounds.  Appropriately tender postop. Ext: No swelling or edema.  No cyanosis.      Data Reviewed: I have personally reviewed following labs and imaging studies  CBC: Recent Labs  Lab 03/04/21 0313 03/06/21 0858  WBC 9.2 11.5*  NEUTROABS 6.6  --   HGB 13.0 12.0*  HCT 41.5 39.0  MCV 72.4* 72.4*  PLT 347 286   Basic Metabolic Panel: Recent Labs  Lab 03/04/21  8675 03/05/21 0307 03/06/21 0858  NA 131* 133* 135  K 4.1 4.1 4.1  CL 101 100 103  CO2 20* 20* 24  GLUCOSE 168* 169* 139*  BUN 20 24* 32*  CREATININE 1.19 1.39* 1.57*  CALCIUM 9.5 9.2 8.4*   GFR: Estimated Creatinine Clearance: 52.2 mL/min (A) (by C-G formula based on SCr of 1.57 mg/dL (H)). Liver Function Tests: Recent Labs  Lab 03/04/21 0313  AST 20  ALT 14  ALKPHOS 93  BILITOT 1.0  PROT 8.0  ALBUMIN 3.5   No results for input(s): LIPASE, AMYLASE in the last 168 hours. No results for input(s):  AMMONIA in the last 168 hours. Coagulation Profile: No results for input(s): INR, PROTIME in the last 168 hours. Cardiac Enzymes: No results for input(s): CKTOTAL, CKMB, CKMBINDEX, TROPONINI in the last 168 hours. BNP (last 3 results) No results for input(s): PROBNP in the last 8760 hours. HbA1C: Recent Labs    03/04/21 2228  HGBA1C 6.0*   CBG: Recent Labs  Lab 03/06/21 1012 03/06/21 1125 03/06/21 1644 03/06/21 2342 03/07/21 0606  GLUCAP 133* 120* 98 99 104*   Lipid Profile: No results for input(s): CHOL, HDL, LDLCALC, TRIG, CHOLHDL, LDLDIRECT in the last 72 hours. Thyroid Function Tests: No results for input(s): TSH, T4TOTAL, FREET4, T3FREE, THYROIDAB in the last 72 hours. Anemia Panel: No results for input(s): VITAMINB12, FOLATE, FERRITIN, TIBC, IRON, RETICCTPCT in the last 72 hours. Sepsis Labs: Recent Labs  Lab 03/04/21 0313  LATICACIDVEN 1.4    Recent Results (from the past 240 hour(s))  Resp Panel by RT-PCR (Flu A&B, Covid) Nasopharyngeal Swab     Status: None   Collection Time: 03/04/21  7:44 AM   Specimen: Nasopharyngeal Swab; Nasopharyngeal(NP) swabs in vial transport medium  Result Value Ref Range Status   SARS Coronavirus 2 by RT PCR NEGATIVE NEGATIVE Final    Comment: (NOTE) SARS-CoV-2 target nucleic acids are NOT DETECTED.  The SARS-CoV-2 RNA is generally detectable in upper respiratory specimens during the acute phase of infection. The lowest concentration of SARS-CoV-2 viral copies this assay can detect is 138 copies/mL. A negative result does not preclude SARS-Cov-2 infection and should not be used as the sole basis for treatment or other patient management decisions. A negative result may occur with  improper specimen collection/handling, submission of specimen other than nasopharyngeal swab, presence of viral mutation(s) within the areas targeted by this assay, and inadequate number of viral copies(<138 copies/mL). A negative result must be  combined with clinical observations, patient history, and epidemiological information. The expected result is Negative.  Fact Sheet for Patients:  BloggerCourse.com  Fact Sheet for Healthcare Providers:  SeriousBroker.it  This test is no t yet approved or cleared by the [place] FDA and  has been authorized for detection and/or diagnosis of SARS-CoV-2 by FDA under an Emergency Use Authorization (EUA). This EUA will remain  in effect (meaning this test can be used) for the duration of the COVID-19 declaration under Section 564(b)(1) of the Act, 21 U.S.C.section 360bbb-3(b)(1), unless the authorization is terminated  or revoked sooner.       Influenza A by PCR NEGATIVE NEGATIVE Final   Influenza B by PCR NEGATIVE NEGATIVE Final    Comment: (NOTE) The Xpert Xpress SARS-CoV-2/FLU/RSV plus assay is intended as an aid in the diagnosis of influenza from Nasopharyngeal swab specimens and should not be used as a sole basis for treatment. Nasal washings and aspirates are unacceptable for Xpert Xpress SARS-CoV-2/FLU/RSV testing.  Fact Sheet for  Patients: BloggerCourse.com  Fact Sheet for Healthcare Providers: SeriousBroker.it  This test is not yet approved or cleared by the [place] FDA and has been authorized for detection and/or diagnosis of SARS-CoV-2 by FDA under an Emergency Use Authorization (EUA). This EUA will remain in effect (meaning this test can be used) for the duration of the COVID-19 declaration under Section 564(b)(1) of the Act, 21 U.S.C. section 360bbb-3(b)(1), unless the authorization is terminated or revoked.  Performed at [facility], [address], [place], [place] [address]   Surgical pcr screen     Status: None   Collection Time: 03/05/21  2:58 AM   Specimen: Nasal Mucosa; Nasal Swab  Result Value Ref Range Status   MRSA, PCR NEGATIVE  NEGATIVE Final   Staphylococcus aureus NEGATIVE NEGATIVE Final    Comment: (NOTE) The Xpert SA Assay (FDA approved for NASAL specimens in patients 2 years of age and older), is one component of a comprehensive surveillance program. It is not intended to diagnose infection nor to guide or monitor treatment. Performed at [facility], [address] [place], [place] [address]          Radiology Studies: No results found.      Scheduled Meds:  heparin  5,000 Units Subcutaneous Q8H   influenza vac split quadrivalent PF  0.5 mL Intramuscular Tomorrow-1000   insulin aspart  0-20 Units Subcutaneous TID WC   lidocaine  1 patch Transdermal Q24H   Continuous Infusions:  sodium chloride 75 mL/hr at 03/07/21 0319   methocarbamol (ROBAXIN) IV 500 mg (03/07/21 0529)     LOS: 3 days    Time spent: 25 minutes    Dorcas Carrow, MD [facility] Pager [phone]

## 2021-03-07 NOTE — Progress Notes (Signed)
Inpatient Rehab Admissions Coordinator:   Per therapy recommendations,  patient was screened for CIR candidacy by Cande Mastropietro, MS, CCC-SLP . At this time, Pt. Appears to demonstrate medical necessity, functional decline, and ability to tolerate intensity of CIR. Pt. is a potential candidate for CIR. I will place   order for rehab consult per protocol for full assessment. Please contact me any with questions..  Alanea Woolridge, MS, CCC-SLP Rehab Admissions Coordinator  [phone] (celll) [phone] (office)  

## 2021-03-07 NOTE — Plan of Care (Signed)
  Problem: Elimination: Goal: Will not experience complications related to bowel motility Outcome: Not Progressing Goal: Will not experience complications related to urinary retention Outcome: Not Progressing   Problem: Pain Managment: Goal: General experience of comfort will improve Outcome: Not Progressing

## 2021-03-07 NOTE — Evaluation (Signed)
Physical Therapy Evaluation Patient Details Name: Daniel Perkins MRN: [id number] DOB: Apr 14, 1956 Today's Date: 03/07/2021  History of Present Illness  65 year old gentleman with history of hypertension, type 2 diabetes, coronary artery disease, peripheral artery disease hyperlipidemia presented with acute onset abdominal pain and found to have small bowel obstruction with transition zone on the left side of the pelvis Underwent ex lap, small bowel perforation repair on 10/28.  Clinical Impression  Patient is s/p above surgery presenting with functional limitations due to the deficits listed below (see PT Problem List). 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 Currently capable to transferring with Mod assistance due to LE weakness and abdominal pain. Ambulates 45 feet with min assist- min guard. Pt lives alone. Interested in CIR and would likely progress quickly with CIR back to independent level for safe transition home after rehab. Patient will benefit from skilled PT to increase their independence and safety with mobility to allow discharge to the venue listed below.          Recommendations for follow up therapy are one component of a multi-disciplinary discharge planning process, led by the attending physician.  Recommendations may be updated based on patient status, additional functional criteria and insurance authorization.  Follow Up Recommendations Acute inpatient rehab (3hours/day)    Assistance Recommended at Discharge    Functional Status Assessment Patient has had a recent decline in their functional status and demonstrates the ability to make significant improvements in function in a reasonable and predictable amount of time.  Equipment Recommendations   (TBD next venue of care)    Recommendations for Other Services Rehab consult     Precautions / Restrictions Precautions Precautions: Other  (comment) (Ex Lap (g-tube)) Restrictions Weight Bearing Restrictions: No      Mobility  Bed Mobility               General bed mobility comments: Sitting in chair when PT arrived    Transfers Overall transfer level: Needs assistance Equipment used: Rolling walker (2 wheels) Transfers: Sit to/from Stand Sit to Stand: Mod assist;From elevated surface           General transfer comment: Mod assist to boost from recliner with pillows under buttocks. Cues for technique, slow to rise and extend trunk fully due to incision.    Ambulation/Gait Ambulation/Gait assistance: Min assist Gait Distance (Feet): 45 Feet Assistive device: Rolling walker (2 wheels) Gait Pattern/deviations: Decreased stride length;Trunk flexed Gait velocity: slow Gait velocity interpretation: <1.31 ft/sec, indicative of household ambulator General Gait Details: Slow, difficulty standing upright. Min assist initially to assist with RW control but progressed with close CGA assist for safety, therapist managed IV pole. Cues for sequencing, breathing techniques. No buckling noted. Shortened stride, slight shuffle at times. Required multiple short standing rest breaks during bout.  Stairs            Wheelchair Mobility    Modified Rankin (Stroke Patients Only)       Balance Overall balance assessment: Needs assistance Sitting-balance support: No upper extremity supported;Feet supported Sitting balance-Leahy Scale: Fair     Standing balance support: Bilateral upper extremity supported Standing balance-Leahy Scale: Poor                               Pertinent Vitals/Pain Pain Assessment: 0-10 Pain Score: 5  Pain Location: abdomen Pain Descriptors / Indicators: Aching Pain Intervention(s):  Monitored during session;Repositioned    Home Living Family/patient expects to be discharged to:: Private residence Living Arrangements: Alone Available Help at Discharge:  Family;Friend(s);Available PRN/intermittently (dtr and sister, however they work and have limited availability) Type of Home: House Home Access: Stairs to enter;Ramped entrance Entrance Stairs-Rails: None Entrance Stairs-Number of Steps: 3 (ramp in fron, steps in back)   Home Layout: One level Home Equipment: Tub bench      Prior Function Prior Level of Function : Independent/Modified Independent                     Hand Dominance   Dominant Hand: Right    Extremity/Trunk Assessment   Upper Extremity Assessment Upper Extremity Assessment: Defer to OT evaluation    Lower Extremity Assessment Lower Extremity Assessment: Generalized weakness       Communication   Communication: No difficulties  Cognition Arousal/Alertness: Awake/alert Behavior During Therapy: WFL for tasks assessed/performed Overall Cognitive Status: Within Functional Limits for tasks assessed                                          General Comments General comments (skin integrity, edema, etc.): RN clamped G tube    Exercises     Assessment/Plan    PT Assessment Patient needs continued PT services  PT Problem List Decreased strength;Decreased activity tolerance;Decreased balance;Decreased mobility;Pain       PT Treatment Interventions DME instruction;Gait training;Stair training;Functional mobility training;Therapeutic activities;Therapeutic exercise;Balance training;Neuromuscular re-education;Patient/family education    PT Goals (Current goals can be found in the Care Plan section)  Acute Rehab PT Goals Patient Stated Goal: Get stronger before going home PT Goal Formulation: With patient Time For Goal Achievement: 03/20/21 Potential to Achieve Goals: Good    Frequency Min 3X/week   Barriers to discharge Decreased caregiver support lives alone    Co-evaluation               AM-PAC PT "6 Clicks" Mobility  Outcome Measure Help needed turning from your  back to your side while in a flat bed without using bedrails?: A Lot Help needed moving from lying on your back to sitting on the side of a flat bed without using bedrails?: A Lot Help needed moving to and from a bed to a chair (including a wheelchair)?: A Little Help needed standing up from a chair using your arms (e.g., wheelchair or bedside chair)?: A Lot Help needed to walk in hospital room?: A Little Help needed climbing 3-5 steps with a railing? : Total 6 Click Score: 13    End of Session Equipment Utilized During Treatment: Gait belt Activity Tolerance: Patient tolerated treatment well Patient left: in chair;with call bell/phone within reach;with nursing/sitter in room;with family/visitor present Nurse Communication: Mobility status PT Visit Diagnosis: Unsteadiness on feet (R26.81);Muscle weakness (generalized) (M62.81);Difficulty in walking, not elsewhere classified (R26.2);Pain Pain - part of body:  (abdomen)    Time: 3244-0102 PT Time Calculation (min) (ACUTE ONLY): 27 min   Charges:   PT Evaluation $PT Eval Low Complexity: 1 Low PT Treatments $Gait Training: 8-22 mins        Kathlyn Sacramento, PT, DPT  [place] 03/07/2021, 4:21 PM

## 2021-03-08 DIAGNOSIS — E785 Hyperlipidemia, unspecified: Secondary | ICD-10-CM | POA: Diagnosis not present

## 2021-03-08 DIAGNOSIS — I10 Essential (primary) hypertension: Secondary | ICD-10-CM | POA: Diagnosis not present

## 2021-03-08 DIAGNOSIS — E0865 Diabetes mellitus due to underlying condition with hyperglycemia: Secondary | ICD-10-CM | POA: Diagnosis not present

## 2021-03-08 LAB — CBC WITH DIFFERENTIAL/PLATELET
Abs Immature Granulocytes: 0.11 10*3/uL — ABNORMAL HIGH (ref 0.00–0.07)
Basophils Absolute: 0 10*3/uL (ref 0.0–0.1)
Basophils Relative: 0 %
Eosinophils Absolute: 0.3 10*3/uL (ref 0.0–0.5)
Eosinophils Relative: 2 %
HCT: 37.8 % — ABNORMAL LOW (ref 39.0–52.0)
Hemoglobin: 11.7 g/dL — ABNORMAL LOW (ref 13.0–17.0)
Immature Granulocytes: 1 %
Lymphocytes Relative: 9 %
Lymphs Abs: 1.2 10*3/uL (ref 0.7–4.0)
MCH: 22.5 pg — ABNORMAL LOW (ref 26.0–34.0)
MCHC: 31 g/dL (ref 30.0–36.0)
MCV: 72.7 fL — ABNORMAL LOW (ref 80.0–100.0)
Monocytes Absolute: 2.1 10*3/uL — ABNORMAL HIGH (ref 0.1–1.0)
Monocytes Relative: 16 %
Neutro Abs: 9 10*3/uL — ABNORMAL HIGH (ref 1.7–7.7)
Neutrophils Relative %: 72 %
Platelets: 287 10*3/uL (ref 150–400)
RBC: 5.2 MIL/uL (ref 4.22–5.81)
RDW: 14.9 % (ref 11.5–15.5)
WBC: 12.7 10*3/uL — ABNORMAL HIGH (ref 4.0–10.5)
nRBC: 0 % (ref 0.0–0.2)

## 2021-03-08 LAB — PHOSPHORUS: Phosphorus: 3 mg/dL (ref 2.5–4.6)

## 2021-03-08 LAB — GLUCOSE, CAPILLARY
Glucose-Capillary: 138 mg/dL — ABNORMAL HIGH (ref 70–99)
Glucose-Capillary: 126 mg/dL — ABNORMAL HIGH (ref 70–99)
Glucose-Capillary: 141 mg/dL — ABNORMAL HIGH (ref 70–99)
Glucose-Capillary: 116 mg/dL — ABNORMAL HIGH (ref 70–99)

## 2021-03-08 LAB — BASIC METABOLIC PANEL
Anion gap: 9 (ref 5–15)
BUN: 16 mg/dL (ref 8–23)
CO2: 22 mmol/L (ref 22–32)
Calcium: 8.9 mg/dL (ref 8.9–10.3)
Chloride: 102 mmol/L (ref 98–111)
Creatinine, Ser: 1.14 mg/dL (ref 0.61–1.24)
GFR, Estimated: >60 mL/min (ref >60)
Glucose, Bld: 118 mg/dL — ABNORMAL HIGH (ref 70–99)
Potassium: 3.9 mmol/L (ref 3.5–5.1)
Sodium: 133 mmol/L — ABNORMAL LOW (ref 135–145)

## 2021-03-08 LAB — MAGNESIUM: Magnesium: 2.5 mg/dL — ABNORMAL HIGH (ref 1.7–2.4)

## 2021-03-08 NOTE — Progress Notes (Signed)
  Mobility Specialist Criteria Algorithm Info.  Mobility Team: HOB elevated: Activity: Ambulated in hall; Stood at bedside; Dangled on edge of bed Range of motion: Active; All extremities Level of assistance: Minimal assist, patient does 75% or more Assistive device: Front wheel walker Minutes sitting in chair:  Minutes stood: 8 minutes Minutes ambulated: 5 minutes Distance ambulated (ft): 80 ft Mobility response: Tolerated well Bed Position: Semi-fowlers  Patient received lying supine in bed willing to participate in mobility. Got to EOB independently and was mod A to boost into standing. Ambulated in hallway at min guard with slow steady gait. Required standing rest break x1 secondary to fatigue/SOB. Returned to room and stood at [employer] for approximately 3 minutes while Probation officer replaced soiled pads. Was able to get back into bed with minimal assistance. Tolerated ambulation well, complained of 8/10 pain in abdomen post ambulation. Was left with all needs met and RN present.    03/08/2021 4:11 PM

## 2021-03-08 NOTE — Progress Notes (Signed)
Inpatient Rehab Admissions Coordinator:   Consult received.  Note still with NG to suction and no bowel function documented yet.  Will also need to demonstrate needs in 2 disciplines to qualify for CIR, so I will place an order for OT eval/treat and will f/u once they've had a chance to weigh in.   Estill Dooms, PT, DPT Admissions Coordinator [phone] 03/08/21  12:25 PM

## 2021-03-08 NOTE — Plan of Care (Signed)
  Problem: Nutrition: Goal: Adequate nutrition will be maintained Outcome: Not Progressing   Problem: Coping: Goal: Level of anxiety will decrease Outcome: Not Progressing   Problem: Elimination: Goal: Will not experience complications related to bowel motility Outcome: Not Progressing

## 2021-03-08 NOTE — Progress Notes (Signed)
PROGRESS NOTE    Daniel Perkins  YQM:[id number] DOB: 1956/01/17 DOA: 03/04/2021 PCP: Ellyn Hack, MD    Brief Narrative:  65 year old gentleman with history of hypertension, type 2 diabetes, coronary artery disease, peripheral artery disease, hyperlipidemia presented with acute onset abdominal pain and found to have small bowel obstruction with transition zone on the left side of the pelvis.  Admitted with surgical consultation.  Seen by cardiology for preop clearance  Underwent ex lap, small bowel perforation repair on 10/28.  Assessment & Plan:   Active Problems:   Diabetes mellitus due to underlying condition, uncontrolled, with hyperglycemia (HCC)   Hyperlipemia   HYPERTENSION   Gout   SBO (small bowel obstruction) (HCC)  Small bowel obstruction due to adhesions and small bowel perforation:  NPO.  NG tube on wall suction.   Adequate pain medications.  Mobility.   Postop management as per surgery.   Mobilizing.  No return of bowel function yet.  Type 2 diabetes: On Victoza and insulin as well as Jardiance at home.  Insulin continued.  We will keep on lower doses of sliding scale insulin while NPO.  Blood sugars are adequate. Will add long-acting insulin once he is able to eat.  Essential hypertension blood pressures well controlled.  Currently unable to take medications by mouth.  On labetalol as needed.  Will resume antihypertensive once he is able to take oral medications.  History of coronary artery disease: Aspirin on hold.  Will resume on discharge.  Hyperlipidemia: On a statin.  Will resume when he is able to take by mouth.  Acute kidney injury: Due to #1.  Improved with treatment.  Continue maintenance IV fluids.  DVT prophylaxis: heparin injection 5,000 Units Start: 03/04/21 0730   Code Status: Full code Family Communication: None. Disposition Plan: Status is: Inpatient  Remains inpatient appropriate because: Immediate postop.  IV fluids and IV pain  medications.  NPO.         Consultants:  General surgery Cardiology  Procedures:  Ex lap 10/28.  Antimicrobials:  None   Subjective: Patient seen and examined.  Feels very hungry.  He was wondering about eating.  No bowel movement or flatus yet.  Feels bloated but better than yesterday.  Objective: Vitals:   03/07/21 1934 03/08/21 0411 03/08/21 0517 03/08/21 0752  BP: 120/76 137/78  (!) 145/82  Pulse: 93 94  96  Resp: 20 17  16   Temp: 98.6 F (37 C) 99 F (37.2 C)  99.9 F (37.7 C)  TempSrc: Oral Oral  Oral  SpO2: 92% (!) 86% 92% (!) 88%  Weight:      Height:        Intake/Output Summary (Last 24 hours) at 03/08/2021 1333 Last data filed at 03/08/2021 1100 Gross per 24 hour  Intake 2138.48 ml  Output 1310 ml  Net 828.48 ml   Filed Weights   03/04/21 0256  Weight: 94.8 kg    Examination:  General: Looks fairly comfortable.  Slightly anxious.  On room air. Cardiovascular: S1-S2 normal.  No added sounds. Respiratory: Bilateral clear.  No added sounds. Gastrointestinal: Soft.  Mildly distended.  No bowel sounds.  Appropriately tender postop. Ext: No swelling or edema.  No cyanosis.      Data Reviewed: I have personally reviewed following labs and imaging studies  CBC: Recent Labs  Lab 03/04/21 0313 03/06/21 0858 03/08/21 0055  WBC 9.2 11.5* 12.7*  NEUTROABS 6.6  --  9.0*  HGB 13.0 12.0* 11.7*  HCT 41.5  39.0 37.8*  MCV 72.4* 72.4* 72.7*  PLT 347 286 287   Basic Metabolic Panel: Recent Labs  Lab 03/04/21 0313 03/05/21 0307 03/06/21 0858 03/08/21 0055  NA 131* 133* 135 133*  K 4.1 4.1 4.1 3.9  CL 101 100 103 102  CO2 20* 20* 24 22  GLUCOSE 168* 169* 139* 118*  BUN 20 24* 32* 16  CREATININE 1.19 1.39* 1.57* 1.14  CALCIUM 9.5 9.2 8.4* 8.9  MG  --   --   --  2.5*  PHOS  --   --   --  3.0   GFR: Estimated Creatinine Clearance: 71.9 mL/min (by C-G formula based on SCr of 1.14 mg/dL). Liver Function Tests: Recent Labs  Lab  03/04/21 0313  AST 20  ALT 14  ALKPHOS 93  BILITOT 1.0  PROT 8.0  ALBUMIN 3.5   No results for input(s): LIPASE, AMYLASE in the last 168 hours. No results for input(s): AMMONIA in the last 168 hours. Coagulation Profile: No results for input(s): INR, PROTIME in the last 168 hours. Cardiac Enzymes: No results for input(s): CKTOTAL, CKMB, CKMBINDEX, TROPONINI in the last 168 hours. BNP (last 3 results) No results for input(s): PROBNP in the last 8760 hours. HbA1C: No results for input(s): HGBA1C in the last 72 hours.  CBG: Recent Labs  Lab 03/07/21 1216 03/07/21 1732 03/07/21 2357 03/08/21 0845 03/08/21 1117  GLUCAP 98 130* 125* 138* 126*   Lipid Profile: No results for input(s): CHOL, HDL, LDLCALC, TRIG, CHOLHDL, LDLDIRECT in the last 72 hours. Thyroid Function Tests: No results for input(s): TSH, T4TOTAL, FREET4, T3FREE, THYROIDAB in the last 72 hours. Anemia Panel: No results for input(s): VITAMINB12, FOLATE, FERRITIN, TIBC, IRON, RETICCTPCT in the last 72 hours. Sepsis Labs: Recent Labs  Lab 03/04/21 0313  LATICACIDVEN 1.4    Recent Results (from the past 240 hour(s))  Resp Panel by RT-PCR (Flu A&B, Covid) Nasopharyngeal Swab     Status: None   Collection Time: 03/04/21  7:44 AM   Specimen: Nasopharyngeal Swab; Nasopharyngeal(NP) swabs in vial transport medium  Result Value Ref Range Status   SARS Coronavirus 2 by RT PCR NEGATIVE NEGATIVE Final    Comment: (NOTE) SARS-CoV-2 target nucleic acids are NOT DETECTED.  The SARS-CoV-2 RNA is generally detectable in upper respiratory specimens during the acute phase of infection. The lowest concentration of SARS-CoV-2 viral copies this assay can detect is 138 copies/mL. A negative result does not preclude SARS-Cov-2 infection and should not be used as the sole basis for treatment or other patient management decisions. A negative result may occur with  improper specimen collection/handling, submission of specimen  other than nasopharyngeal swab, presence of viral mutation(s) within the areas targeted by this assay, and inadequate number of viral copies(<138 copies/mL). A negative result must be combined with clinical observations, patient history, and epidemiological information. The expected result is Negative.  Fact Sheet for Patients:  BloggerCourse.com  Fact Sheet for Healthcare Providers:  SeriousBroker.it  This test is no t yet approved or cleared by the [place] FDA and  has been authorized for detection and/or diagnosis of SARS-CoV-2 by FDA under an Emergency Use Authorization (EUA). This EUA will remain  in effect (meaning this test can be used) for the duration of the COVID-19 declaration under Section 564(b)(1) of the Act, 21 U.S.C.section 360bbb-3(b)(1), unless the authorization is terminated  or revoked sooner.       Influenza A by PCR NEGATIVE NEGATIVE Final   Influenza B by PCR NEGATIVE  NEGATIVE Final    Comment: (NOTE) The Xpert Xpress SARS-CoV-2/FLU/RSV plus assay is intended as an aid in the diagnosis of influenza from Nasopharyngeal swab specimens and should not be used as a sole basis for treatment. Nasal washings and aspirates are unacceptable for Xpert Xpress SARS-CoV-2/FLU/RSV testing.  Fact Sheet for Patients: BloggerCourse.com  Fact Sheet for Healthcare Providers: SeriousBroker.it  This test is not yet approved or cleared by the [place] FDA and has been authorized for detection and/or diagnosis of SARS-CoV-2 by FDA under an Emergency Use Authorization (EUA). This EUA will remain in effect (meaning this test can be used) for the duration of the COVID-19 declaration under Section 564(b)(1) of the Act, 21 U.S.C. section 360bbb-3(b)(1), unless the authorization is terminated or revoked.  Performed at [facility], [address], [place],  [place] [address]   Surgical pcr screen     Status: None   Collection Time: 03/05/21  2:58 AM   Specimen: Nasal Mucosa; Nasal Swab  Result Value Ref Range Status   MRSA, PCR NEGATIVE NEGATIVE Final   Staphylococcus aureus NEGATIVE NEGATIVE Final    Comment: (NOTE) The Xpert SA Assay (FDA approved for NASAL specimens in patients 85 years of age and older), is one component of a comprehensive surveillance program. It is not intended to diagnose infection nor to guide or monitor treatment. Performed at [facility], [address] [place], [place] [address]          Radiology Studies: No results found.      Scheduled Meds:  heparin  5,000 Units Subcutaneous Q8H   influenza vac split quadrivalent PF  0.5 mL Intramuscular Tomorrow-1000   insulin aspart  0-20 Units Subcutaneous TID WC   lidocaine  1 patch Transdermal Q24H   Continuous Infusions:  sodium chloride 75 mL/hr at 03/08/21 0512   methocarbamol (ROBAXIN) IV 500 mg (03/08/21 1132)     LOS: 4 days    Time spent: 25 minutes    Dorcas Carrow, MD [facility] Pager [phone]

## 2021-03-08 NOTE — Care Management Important Message (Signed)
Important Message  Patient Details  Name: Kross Swallows MRN: [id number] Date of Birth: February 19, 1956   Medicare Important Message Given:  Yes     Mohamed Portlock Stefan Church 03/08/2021, 2:33 PM

## 2021-03-08 NOTE — Progress Notes (Signed)
Patient ID: Daniel Perkins, male   DOB: 1956/01/11, 65 y.o.   MRN: [id number] [facility] Surgery Progress Note  3 Days Post-Op  Subjective: CC-  Pain well controlled. Using IS. Denies flatus or BM. Says he feels hungry   Objective: Vital signs in last 24 hours: Temp:  [97.6 F (36.4 C)-99.9 F (37.7 C)] 99.9 F (37.7 C) (10/31 0752) Pulse Rate:  [93-101] 96 (10/31 0752) Resp:  [16-20] 16 (10/31 0752) BP: (120-145)/(75-82) 145/82 (10/31 0752) SpO2:  [86 %-92 %] 88 % (10/31 0752) Last BM Date: 03/02/21  Intake/Output from previous day: 10/30 0701 - 10/31 0700 In: 2198.5 [P.O.:120; I.V.:1828.5; IV Piggyback:250] Out: 830 [Urine:580; Emesis/NG output:250] Intake/Output this shift: Total I/O In: -  Out: 680 [Urine:680]  PE: Gen:  Alert, NAD, pleasant HEENT: NG tube with dark green bilious drainage Card:  RRR Pulm:  normal effort on room air Abd: soft, mild distension, hypoactive BS, lap incisions and midline mini laparotomy cdi with staples present and no erythema or drainage  Lab Results:  Recent Labs    03/06/21 0858 03/08/21 0055  WBC 11.5* 12.7*  HGB 12.0* 11.7*  HCT 39.0 37.8*  PLT 286 287   BMET Recent Labs    03/06/21 0858 03/08/21 0055  NA 135 133*  K 4.1 3.9  CL 103 102  CO2 24 22  GLUCOSE 139* 118*  BUN 32* 16  CREATININE 1.57* 1.14  CALCIUM 8.4* 8.9   PT/INR No results for input(s): LABPROT, INR in the last 72 hours. CMP     Component Value Date/Time   NA 133 (L) 03/08/2021 0055   NA 137 02/10/2021 1538   K 3.9 03/08/2021 0055   CL 102 03/08/2021 0055   CO2 22 03/08/2021 0055   GLUCOSE 118 (H) 03/08/2021 0055   BUN 16 03/08/2021 0055   BUN 13 02/10/2021 1538   CREATININE 1.14 03/08/2021 0055   CREATININE 1.22 06/30/2014 1634   CALCIUM 8.9 03/08/2021 0055   PROT 8.0 03/04/2021 0313   PROT 7.2 12/06/2018 1648   ALBUMIN 3.5 03/04/2021 0313   ALBUMIN 4.2 12/06/2018 1648   AST 20 03/04/2021 0313   ALT 14 03/04/2021 0313    ALKPHOS 93 03/04/2021 0313   BILITOT 1.0 03/04/2021 0313   BILITOT 0.3 12/06/2018 1648   GFRNONAA >60 03/08/2021 0055   GFRNONAA 78 10/23/2013 1554   GFRAA 86 06/09/2020 1508   GFRAA >89 10/23/2013 1554   Lipase  No results found for: LIPASE     Studies/Results: No results found.  Anti-infectives: Anti-infectives (From admission, onward)    Start     Dose/Rate Route Frequency Ordered Stop   03/05/21 0845  cefoTEtan (CEFOTAN) 2 g in sodium chloride 0.9 % 100 mL IVPB        2 g 200 mL/hr over 30 Minutes Intravenous To [facility] 03/04/21 2206 03/05/21 1035        Assessment/Plan SBO with no previous abdominal surgical history  POD#3 S/P diagnostic laparoscopy, lysis of single adhesive band in the left lower quadrant with resolution of bowel obstruction, mini laparotomy with primary repair of 2 mm small bowel perforation on 03/05/21 Dr. Fredricka Bonine - Continue NPO/NGT to [facility] and await return in bowel function - mobilize. Working with PT/OT who are currently recommending CIR - WBC 12.7 from 11.5, afebrile. Monitor. If patient has persistent leukocytosis and ileus will consider CT scan in a couple days   ID - cefotetan periop 10/28 VTE - SCDs, sq heparin FEN - IVF, NPO/NGT  to LIWS, allow 1 popsicle daily Foley - none   DM HTN CAD h/o MI several years ago - followed by Dr. Eldridge Dace PAD HLD Chronic neck pain   LOS: 4 days    Carlena Bjornstad, [facility] Surgery 03/08/2021, 12:00 PM Please see Amion for pager number during day hours 7:00am-4:30pm

## 2021-03-09 ENCOUNTER — Ambulatory Visit: Payer: Medicare Other | Admitting: Podiatry

## 2021-03-09 DIAGNOSIS — E785 Hyperlipidemia, unspecified: Secondary | ICD-10-CM | POA: Diagnosis not present

## 2021-03-09 DIAGNOSIS — I10 Essential (primary) hypertension: Secondary | ICD-10-CM | POA: Diagnosis not present

## 2021-03-09 DIAGNOSIS — E0865 Diabetes mellitus due to underlying condition with hyperglycemia: Secondary | ICD-10-CM | POA: Diagnosis not present

## 2021-03-09 LAB — GLUCOSE, CAPILLARY
Glucose-Capillary: 126 mg/dL — ABNORMAL HIGH (ref 70–99)
Glucose-Capillary: 132 mg/dL — ABNORMAL HIGH (ref 70–99)
Glucose-Capillary: 115 mg/dL — ABNORMAL HIGH (ref 70–99)
Glucose-Capillary: 138 mg/dL — ABNORMAL HIGH (ref 70–99)
Glucose-Capillary: 118 mg/dL — ABNORMAL HIGH (ref 70–99)

## 2021-03-09 LAB — CBC
HCT: 39.3 % (ref 39.0–52.0)
Hemoglobin: 12.6 g/dL — ABNORMAL LOW (ref 13.0–17.0)
MCH: 22.2 pg — ABNORMAL LOW (ref 26.0–34.0)
MCHC: 32.1 g/dL (ref 30.0–36.0)
MCV: 69.2 fL — ABNORMAL LOW (ref 80.0–100.0)
Platelets: 369 10*3/uL (ref 150–400)
RBC: 5.68 MIL/uL (ref 4.22–5.81)
RDW: 14.4 % (ref 11.5–15.5)
WBC: 14.2 10*3/uL — ABNORMAL HIGH (ref 4.0–10.5)
nRBC: 0 % (ref 0.0–0.2)

## 2021-03-09 LAB — BASIC METABOLIC PANEL
Anion gap: 9 (ref 5–15)
BUN: 15 mg/dL (ref 8–23)
CO2: 20 mmol/L — ABNORMAL LOW (ref 22–32)
Calcium: 8.8 mg/dL — ABNORMAL LOW (ref 8.9–10.3)
Chloride: 105 mmol/L (ref 98–111)
Creatinine, Ser: 1.04 mg/dL (ref 0.61–1.24)
GFR, Estimated: >60 mL/min (ref >60)
Glucose, Bld: 119 mg/dL — ABNORMAL HIGH (ref 70–99)
Potassium: 3.7 mmol/L (ref 3.5–5.1)
Sodium: 134 mmol/L — ABNORMAL LOW (ref 135–145)

## 2021-03-09 NOTE — Progress Notes (Signed)
Mobility Specialist Criteria Algorithm Info.  Mobility Team: HOB elevated: Activity: Ambulated in hall; Transferred:  Chair to bed Range of motion: Active; All extremities Level of assistance: Minimal assist, patient does 75% or more Assistive device: Front wheel walker Minutes sitting in chair:  Minutes stood: 8 minutes Minutes ambulated: 5 minutes Distance ambulated (ft): 150 ft Mobility response: Tolerated well Bed Position: Semi-fowlers  Patient received in recliner chair willing to participate in mobility. Patient stood min A from chair and ambulated in hallway at min guard with slow steady gait. Required seated rest break. Returned to room and requested to get back in bed. Was left lying supine in bed with all needs met.   03/09/2021 4:25 PM

## 2021-03-09 NOTE — Progress Notes (Signed)
PROGRESS NOTE    Daniel Perkins  IRS:[id number] DOB: Aug 19, 1955 DOA: 03/04/2021 PCP: Ellyn Hack, MD    Brief Narrative:  65 year old gentleman with history of hypertension, type 2 diabetes, coronary artery disease, peripheral artery disease, hyperlipidemia presented with acute onset abdominal pain and found to have small bowel obstruction with transition zone on the left side of the pelvis.  Admitted with surgical consultation.  Seen by cardiology for preop clearance  Underwent ex lap, small bowel perforation repair on 10/28.  Assessment & Plan:   Active Problems:   Diabetes mellitus due to underlying condition, uncontrolled, with hyperglycemia (HCC)   Hyperlipemia   HYPERTENSION   Gout   SBO (small bowel obstruction) (HCC)  Small bowel obstruction due to adhesions and small bowel perforation: Persistent ileus. As per surgery.  NG tube clamping trial and clear liquids today. Continue to mobilize.  Adequate pain medications. WBC persistently elevated, patient is clinically improving.  May need repeated scanning to rule out perforation if continues to have elevated white count.  Type 2 diabetes: On Victoza and insulin as well as Jardiance at home.  Insulin continued.  We will keep on lower doses of sliding scale insulin while NPO.  Blood sugars are adequate. Will add long-acting insulin once he is able to eat.  Continue similar doses today.  Essential hypertension blood pressures well controlled.  Currently unable to take medications by mouth.  On labetalol as needed.  Will resume antihypertensive once he is able to take oral medications.  History of coronary artery disease: Aspirin on hold.  Will resume on discharge.  Hyperlipidemia: On a statin.  Will resume when he is able to take by mouth.  Acute kidney injury: Due to #1.  Improved with treatment.  Continue maintenance IV fluids.  DVT prophylaxis: heparin injection 5,000 Units Start: 03/04/21 0730   Code Status:  Full code Family Communication: Daughter was present on the phone. Disposition Plan: Status is: Inpatient  Remains inpatient appropriate because: Immediate postop.  IV fluids and IV pain medications.  NPO.         Consultants:  General surgery Cardiology  Procedures:  Ex lap 10/28.  Antimicrobials:  None   Subjective: Patient seen and examined.  He still has some soreness.  No flatus or bowel movement.  Denies any nausea.  He was very happy to have 1 popsicle last night.  He thinks he should eat.  Blood sugar remained stable.  Objective: Vitals:   03/08/21 1640 03/08/21 1930 03/09/21 0448 03/09/21 0758  BP: 132/88 (!) 156/91 (!) 154/91 (!) 162/93  Pulse: 98 96 86 89  Resp: 16 20 17 14   Temp: 99.1 F (37.3 C) 99.6 F (37.6 C) 98.6 F (37 C) 98.2 F (36.8 C)  TempSrc: Oral Oral Oral Oral  SpO2: 93% 93% 93% 90%  Weight:      Height:        Intake/Output Summary (Last 24 hours) at 03/09/2021 1417 Last data filed at 03/09/2021 13/05/2020 Gross per 24 hour  Intake 1039.93 ml  Output 950 ml  Net 89.93 ml   Filed Weights   03/04/21 0256  Weight: 94.8 kg    Examination:  General: Looks fairly comfortable.  On room air.   Cardiovascular: S1-S2 normal.  No added sounds. Respiratory: Bilateral clear.  No added sounds. Gastrointestinal: Soft.  Mildly distended.  No bowel sounds.  Appropriately tender postop. NG tube with minimum green secretions. Ext: No swelling or edema.  No cyanosis.  Data Reviewed: I have personally reviewed following labs and imaging studies  CBC: Recent Labs  Lab 03/04/21 0313 03/06/21 0858 03/08/21 0055 03/09/21 0820  WBC 9.2 11.5* 12.7* 14.2*  NEUTROABS 6.6  --  9.0*  --   HGB 13.0 12.0* 11.7* 12.6*  HCT 41.5 39.0 37.8* 39.3  MCV 72.4* 72.4* 72.7* 69.2*  PLT 347 286 287 369   Basic Metabolic Panel: Recent Labs  Lab 03/04/21 0313 03/05/21 0307 03/06/21 0858 03/08/21 0055 03/09/21 0155  NA 131* 133* 135 133* 134*  K  4.1 4.1 4.1 3.9 3.7  CL 101 100 103 102 105  CO2 20* 20* 24 22 20*  GLUCOSE 168* 169* 139* 118* 119*  BUN 20 24* 32* 16 15  CREATININE 1.19 1.39* 1.57* 1.14 1.04  CALCIUM 9.5 9.2 8.4* 8.9 8.8*  MG  --   --   --  2.5*  --   PHOS  --   --   --  3.0  --    GFR: Estimated Creatinine Clearance: 78.8 mL/min (by C-G formula based on SCr of 1.04 mg/dL). Liver Function Tests: Recent Labs  Lab 03/04/21 0313  AST 20  ALT 14  ALKPHOS 93  BILITOT 1.0  PROT 8.0  ALBUMIN 3.5   No results for input(s): LIPASE, AMYLASE in the last 168 hours. No results for input(s): AMMONIA in the last 168 hours. Coagulation Profile: No results for input(s): INR, PROTIME in the last 168 hours. Cardiac Enzymes: No results for input(s): CKTOTAL, CKMB, CKMBINDEX, TROPONINI in the last 168 hours. BNP (last 3 results) No results for input(s): PROBNP in the last 8760 hours. HbA1C: No results for input(s): HGBA1C in the last 72 hours.  CBG: Recent Labs  Lab 03/08/21 1648 03/08/21 2054 03/09/21 0624 03/09/21 0809 03/09/21 1250  GLUCAP 141* 116* 126* 132* 115*   Lipid Profile: No results for input(s): CHOL, HDL, LDLCALC, TRIG, CHOLHDL, LDLDIRECT in the last 72 hours. Thyroid Function Tests: No results for input(s): TSH, T4TOTAL, FREET4, T3FREE, THYROIDAB in the last 72 hours. Anemia Panel: No results for input(s): VITAMINB12, FOLATE, FERRITIN, TIBC, IRON, RETICCTPCT in the last 72 hours. Sepsis Labs: Recent Labs  Lab 03/04/21 0313  LATICACIDVEN 1.4    Recent Results (from the past 240 hour(s))  Resp Panel by RT-PCR (Flu A&B, Covid) Nasopharyngeal Swab     Status: None   Collection Time: 03/04/21  7:44 AM   Specimen: Nasopharyngeal Swab; Nasopharyngeal(NP) swabs in vial transport medium  Result Value Ref Range Status   SARS Coronavirus 2 by RT PCR NEGATIVE NEGATIVE Final    Comment: (NOTE) SARS-CoV-2 target nucleic acids are NOT DETECTED.  The SARS-CoV-2 RNA is generally detectable in upper  respiratory specimens during the acute phase of infection. The lowest concentration of SARS-CoV-2 viral copies this assay can detect is 138 copies/mL. A negative result does not preclude SARS-Cov-2 infection and should not be used as the sole basis for treatment or other patient management decisions. A negative result may occur with  improper specimen collection/handling, submission of specimen other than nasopharyngeal swab, presence of viral mutation(s) within the areas targeted by this assay, and inadequate number of viral copies(<138 copies/mL). A negative result must be combined with clinical observations, patient history, and epidemiological information. The expected result is Negative.  Fact Sheet for Patients:  BloggerCourse.com  Fact Sheet for Healthcare Providers:  SeriousBroker.it  This test is no t yet approved or cleared by the [place] FDA and  has been authorized for detection and/or  diagnosis of SARS-CoV-2 by FDA under an Emergency Use Authorization (EUA). This EUA will remain  in effect (meaning this test can be used) for the duration of the COVID-19 declaration under Section 564(b)(1) of the Act, 21 U.S.C.section 360bbb-3(b)(1), unless the authorization is terminated  or revoked sooner.       Influenza A by PCR NEGATIVE NEGATIVE Final   Influenza B by PCR NEGATIVE NEGATIVE Final    Comment: (NOTE) The Xpert Xpress SARS-CoV-2/FLU/RSV plus assay is intended as an aid in the diagnosis of influenza from Nasopharyngeal swab specimens and should not be used as a sole basis for treatment. Nasal washings and aspirates are unacceptable for Xpert Xpress SARS-CoV-2/FLU/RSV testing.  Fact Sheet for Patients: BloggerCourse.com  Fact Sheet for Healthcare Providers: SeriousBroker.it  This test is not yet approved or cleared by the [place] FDA and has been  authorized for detection and/or diagnosis of SARS-CoV-2 by FDA under an Emergency Use Authorization (EUA). This EUA will remain in effect (meaning this test can be used) for the duration of the COVID-19 declaration under Section 564(b)(1) of the Act, 21 U.S.C. section 360bbb-3(b)(1), unless the authorization is terminated or revoked.  Performed at [facility], [address] [place], [place] [address]   Surgical pcr screen     Status: None   Collection Time: 03/05/21  2:58 AM   Specimen: Nasal Mucosa; Nasal Swab  Result Value Ref Range Status   MRSA, PCR NEGATIVE NEGATIVE Final   Staphylococcus aureus NEGATIVE NEGATIVE Final    Comment: (NOTE) The Xpert SA Assay (FDA approved for NASAL specimens in patients 15 years of age and older), is one component of a comprehensive surveillance program. It is not intended to diagnose infection nor to guide or monitor treatment. Performed at [facility], [address], [place], [place] [address]          Radiology Studies: No results found.      Scheduled Meds:  heparin  5,000 Units Subcutaneous Q8H   insulin aspart  0-20 Units Subcutaneous TID WC   lidocaine  1 patch Transdermal Q24H   Continuous Infusions:  sodium chloride 75 mL/hr at 03/09/21 0410   methocarbamol (ROBAXIN) IV 500 mg (03/09/21 1245)     LOS: 5 days    Time spent: 25 minutes    Dorcas Carrow, MD [facility] Pager [phone]

## 2021-03-09 NOTE — Progress Notes (Signed)
Inpatient Rehab Admissions Coordinator:   Met with patient at the bedside to discuss CIR recommendations.  I let him know that average length of stay on CIR is about 2 weeks, and goals for him would be modified independent (able to complete ADLs and mobility with DME or increased time and no assist).  He does have a daughter and sister in town but they're not able to provide 24/7 help.  I let him know that [facility] Medicare would require prior auth, and that I wasn't confident I could get authorization once he was medically stable (NG tube out and SBO resolved).  I will continue to follow for timing to open insurance per pts request.  I will also call his daughter to discuss, per his request.   Shann Medal, PT, DPT Admissions Coordinator [phone] 03/09/21  2:22 PM

## 2021-03-09 NOTE — Progress Notes (Signed)
Patient ID: Daniel Perkins, male   DOB: 01-Aug-1955, 65 y.o.   MRN: [id number] [facility] Surgery Progress Note  4 Days Post-Op  Subjective: CC-  Abdomen still sore but pain improving. Denies bloating, n/v. No flatus or BM. Feels hungry. NG tube output not recorded  Objective: Vital signs in last 24 hours: Temp:  [98.2 F (36.8 C)-99.6 F (37.6 C)] 98.2 F (36.8 C) (11/01 0758) Pulse Rate:  [86-98] 89 (11/01 0758) Resp:  [14-20] 14 (11/01 0758) BP: (132-162)/(88-93) 162/93 (11/01 0758) SpO2:  [90 %-93 %] 90 % (11/01 0758) Last BM Date: 03/02/21  Intake/Output from previous day: 10/31 0701 - 11/01 0700 In: 1039.9 [P.O.:90; I.V.:949.9] Out: 1630 [Urine:1630] Intake/Output this shift: No intake/output data recorded.  PE: Gen:  Alert, NAD, pleasant HEENT: NG tube with light green bilious drainage Card:  RRR Pulm:  normal effort on room air Abd: soft, mild distension, + BS, lap incisions with cdi dressings, midline mini laparotomy cdi with staples present and no erythema or drainage and honeycomb dressing in place  Lab Results:  Recent Labs    03/08/21 0055 03/09/21 0820  WBC 12.7* 14.2*  HGB 11.7* 12.6*  HCT 37.8* 39.3  PLT 287 369   BMET Recent Labs    03/08/21 0055 03/09/21 0155  NA 133* 134*  K 3.9 3.7  CL 102 105  CO2 22 20*  GLUCOSE 118* 119*  BUN 16 15  CREATININE 1.14 1.04  CALCIUM 8.9 8.8*   PT/INR No results for input(s): LABPROT, INR in the last 72 hours. CMP     Component Value Date/Time   NA 134 (L) 03/09/2021 0155   NA 137 02/10/2021 1538   K 3.7 03/09/2021 0155   CL 105 03/09/2021 0155   CO2 20 (L) 03/09/2021 0155   GLUCOSE 119 (H) 03/09/2021 0155   BUN 15 03/09/2021 0155   BUN 13 02/10/2021 1538   CREATININE 1.04 03/09/2021 0155   CREATININE 1.22 06/30/2014 1634   CALCIUM 8.8 (L) 03/09/2021 0155   PROT 8.0 03/04/2021 0313   PROT 7.2 12/06/2018 1648   ALBUMIN 3.5 03/04/2021 0313   ALBUMIN 4.2 12/06/2018 1648   AST 20  03/04/2021 0313   ALT 14 03/04/2021 0313   ALKPHOS 93 03/04/2021 0313   BILITOT 1.0 03/04/2021 0313   BILITOT 0.3 12/06/2018 1648   GFRNONAA >60 03/09/2021 0155   GFRNONAA 78 10/23/2013 1554   GFRAA 86 06/09/2020 1508   GFRAA >89 10/23/2013 1554   Lipase  No results found for: LIPASE     Studies/Results: No results found.  Anti-infectives: Anti-infectives (From admission, onward)    Start     Dose/Rate Route Frequency Ordered Stop   03/05/21 0845  cefoTEtan (CEFOTAN) 2 g in sodium chloride 0.9 % 100 mL IVPB        2 g 200 mL/hr over 30 Minutes Intravenous To [facility] 03/04/21 2206 03/05/21 1035        Assessment/Plan SBO with no previous abdominal surgical history  POD#4 S/P diagnostic laparoscopy, lysis of single adhesive band in the left lower quadrant with resolution of bowel obstruction, mini laparotomy with primary repair of 2 mm small bowel perforation on 03/05/21 Dr. Fredricka Bonine - Clamp NG tube and give clear liquids. Return NG to LIWS if patient becomes bloated or nauseated - mobilize. Working with PT/OT who are currently recommending CIR - WBC 14.2 from 12.7, afebrile. Monitor. If patient has persistent leukocytosis and ileus will consider CT scan in a couple days  ID - cefotetan periop 10/28 VTE - SCDs, sq heparin FEN - IVF, clamp NG, CLD Foley - none   DM HTN CAD h/o MI several years ago - followed by Dr. Eldridge Dace PAD HLD Chronic neck pain   LOS: 5 days    Franne Forts, [facility] Surgery 03/09/2021, 9:55 AM Please see Amion for pager number during day hours 7:00am-4:30pm

## 2021-03-09 NOTE — Evaluation (Addendum)
Occupational Therapy Evaluation Patient Details Name: Daniel Perkins MRN: [id number] DOB: June 22, 1955 Today's Date: 03/09/2021   History of Present Illness 65 y/o M admitted to ED for acute abdominal pain, found to have small bowel obstruction. S/p ex lap, small bowel perforation repair on 10/28. PMH includes CAD/MI, gout, CKD, HTN, DM and ACDF.   Clinical Impression   PTA, pt was living alone and was independent. Pt currently requiring Min A for UB ADLs, Min A for LB ADLs,  and Min Guard-Min A for functional mobility with RW. Providing education on compensatory techniques for grooming and LB dressing. Pt presenting with decreased activity tolerance with pain and fatigue, however, very motivated despite fatigue. Pt would benefit from further acute OT to facilitate safe dc. Due to pt's high motivation and change in function, recommend dc to CIR for further OT to optimize safety, independence with ADLs, and return to PLOF.      Recommendations for follow up therapy are one component of a multi-disciplinary discharge planning process, led by the attending physician.  Recommendations may be updated based on patient status, additional functional criteria and insurance authorization.   Follow Up Recommendations  Acute inpatient rehab (3hours/day)    Assistance Recommended at Discharge Intermittent Supervision/Assistance  Functional Status Assessment  Patient has had a recent decline in their functional status and demonstrates the ability to make significant improvements in function in a reasonable and predictable amount of time.  Equipment Recommendations  None recommended by OT    Recommendations for Other Services PT consult     Precautions / Restrictions Precautions Precautions: Other (comment) Precaution Comments: NG tube, abdominal precautions Restrictions Weight Bearing Restrictions: No Other Position/Activity Restrictions: no lifting >10lbs      Mobility Bed Mobility Overal  bed mobility: Needs Assistance Bed Mobility: Rolling;Sidelying to Sit Rolling: Min assist Sidelying to sit: Mod assist       General bed mobility comments: In recliner upon arrival    Transfers Overall transfer level: Needs assistance Equipment used: Rolling walker (2 wheels) Transfers: Sit to/from Stand Sit to Stand: Min assist           General transfer comment: Min A for power up into standing. Cues for correct positioning and weight shift      Balance Overall balance assessment: Needs assistance Sitting-balance support: No upper extremity supported Sitting balance-Leahy Scale: Fair Sitting balance - Comments: unable to don socks/shoes   Standing balance support: Bilateral upper extremity supported Standing balance-Leahy Scale: Poor Standing balance comment: dependent on walker                           ADL either performed or assessed with clinical judgement   ADL Overall ADL's : Needs assistance/impaired Eating/Feeding: Set up;Sitting   Grooming: Wash/dry face;Standing;Min guard Grooming Details (indicate cue type and reason): adhering to abdominal precautions, completed standing sinkside grooming task Upper Body Bathing: Minimal assistance;Sitting   Lower Body Bathing: Moderate assistance;Sit to/from stand   Upper Body Dressing : Moderate assistance;Sitting Upper Body Dressing Details (indicate cue type and reason): educated pt on UE dressing adhering to precautions Lower Body Dressing: Moderate assistance;Sit to/from stand   Toilet Transfer: Teacher, early years/pre Details (indicate cue type and reason): simulated toilet transfer using RW Toileting- Clothing Manipulation and Hygiene: Minimal assistance;Sit to/from stand       Functional mobility during ADLs: Min guard;Rolling walker (2 wheels) General ADL Comments: Presenting with decreased activity tolerance due to fatigue and pain.  Very motivated     Scientist, product/process development      Pertinent Vitals/Pain Pain Assessment: Faces Pain Score: 5  Faces Pain Scale: Hurts a little bit Pain Location: abdomen when getting up Pain Descriptors / Indicators: Aching;Discomfort Pain Intervention(s): Monitored during session;Limited activity within patient's tolerance;Repositioned     Hand Dominance Right   Extremity/Trunk Assessment Upper Extremity Assessment Upper Extremity Assessment: Overall WFL for tasks assessed   Lower Extremity Assessment Lower Extremity Assessment: Defer to PT evaluation   Cervical / Trunk Assessment Cervical / Trunk Assessment: Other exceptions Cervical / Trunk Exceptions: Abdominal sx   Communication Communication Communication: No difficulties   Cognition Arousal/Alertness: Awake/alert Behavior During Therapy: WFL for tasks assessed/performed Overall Cognitive Status: Within Functional Limits for tasks assessed                                       General Comments  NG tube clamped, abdominal incision not observed    Exercises     Shoulder Instructions      Home Living Family/patient expects to be discharged to:: Private residence Living Arrangements: Alone Available Help at Discharge: Family;Friend(s);Available PRN/intermittently Type of Home: House Home Access: Stairs to enter;Ramped entrance Entrance Stairs-Number of Steps: 3 Entrance Stairs-Rails: None Home Layout: One level     Bathroom Shower/Tub: Chief Strategy Officer: Standard     Home Equipment: Shower seat   Additional Comments: Reports a friend may be able to stay at [employer]      Prior Functioning/Environment Prior Level of Function : Independent/Modified Independent                        OT Problem List: Decreased strength;Decreased range of motion;Decreased activity tolerance;Impaired balance (sitting and/or standing);Decreased knowledge of use of DME or AE;Decreased knowledge of  precautions      OT Treatment/Interventions: Self-care/ADL training    OT Goals(Current goals can be found in the care plan section) Acute Rehab OT Goals Patient Stated Goal: "Return home" OT Goal Formulation: With patient Time For Goal Achievement: 03/23/21 Potential to Achieve Goals: Good ADL Goals Pt Will Perform Upper Body Dressing: sitting;with supervision Pt Will Perform Lower Body Dressing: sit to/from stand;with min guard assist Pt Will Transfer to Toilet: regular height toilet;with min guard assist Pt Will Perform Tub/Shower Transfer: with min assist  OT Frequency: Min 2X/week   Barriers to D/C:            Co-evaluation              AM-PAC OT "6 Clicks" Daily Activity     Outcome Measure Help from another person eating meals?: A Little Help from another person taking care of personal grooming?: None Help from another person toileting, which includes using toliet, bedpan, or urinal?: A Little Help from another person bathing (including washing, rinsing, drying)?: A Little Help from another person to put on and taking off regular upper body clothing?: A Little Help from another person to put on and taking off regular lower body clothing?: A Little 6 Click Score: 19   End of Session Equipment Utilized During Treatment: Gait belt;Rolling walker (2 wheels) Nurse Communication: Mobility status  Activity Tolerance: Patient tolerated treatment well Patient left: in chair;with call bell/phone within reach  OT Visit Diagnosis: Unsteadiness on feet (R26.81);Other abnormalities  of gait and mobility (R26.89);Muscle weakness (generalized) (M62.81);Pain                Time: 9604-5409 OT Time Calculation (min): 20 min Charges:  OT General Charges $OT Visit: 1 Visit OT Evaluation $OT Eval Low Complexity: 1 Low  Braelyn Jenson MSOT, OTR/L Acute Rehab Pager: [phone] Office: [phone]  Theodoro Grist Latoia Eyster 03/09/2021, 12:43 PM

## 2021-03-09 NOTE — Progress Notes (Signed)
Physical Therapy Treatment Patient Details Name: Daniel Perkins MRN: [id number] DOB: [birth year] Today's Date: 03/09/2021   History of Present Illness 65 y/o M admitted to ED for acute abdominal pain, found to have small bowel obstruction. S/p ex lap, small bowel perforation repair. PMH includes CAD/MI, gout, CKD, HTN, DM and ACDF.    PT Comments    Pt slowly progressing, remains limited by abdominal pain with movement. Pt was indep PTA and was living alone. Pt to benefit from CIR upon d/c to achieve safe mod I level of function for safe transition home. Pt demo's excellent rehab potential as he has improved amb tolerance, transfer ability, and is very motivated. Acute PT to cont to follow.    Recommendations for follow up therapy are one component of a multi-disciplinary discharge planning process, led by the attending physician.  Recommendations may be updated based on patient status, additional functional criteria and insurance authorization.  Follow Up Recommendations  Acute inpatient rehab (3hours/day)     Assistance Recommended at Discharge    Equipment Recommendations       Recommendations for Other Services Rehab consult     Precautions / Restrictions Precautions Precautions: Other (comment) Precaution Comments: NG tube, abdominal precautions Restrictions Weight Bearing Restrictions: No Other Position/Activity Restrictions: no lifting >10lbs     Mobility  Bed Mobility Overal bed mobility: Needs Assistance Bed Mobility: Rolling;Sidelying to Sit Rolling: Min assist Sidelying to sit: Mod assist       General bed mobility comments: minA to achieve full sidelying, modA for trunk elevation from HOB level, discussed log roll technique    Transfers Overall transfer level: Needs assistance Equipment used: Rolling walker (2 wheels) Transfers: Sit to/from Stand Sit to Stand: Mod assist           General transfer comment: modA to power up and stedy during  transition of hands from bed to RW, increased time, onset of pain with transition of trunk extension    Ambulation/Gait Ambulation/Gait assistance: Min assist Gait Distance (Feet): 100 Feet Assistive device: Rolling walker (2 wheels) Gait Pattern/deviations: Decreased stride length;Trunk flexed Gait velocity: slow Gait velocity interpretation: <1.31 ft/sec, indicative of household ambulator General Gait Details: progressively improved speed and ability to stand upright, limited by pull on abdominal incision   Stairs             Wheelchair Mobility    Modified Rankin (Stroke Patients Only)       Balance Overall balance assessment: Needs assistance Sitting-balance support: No upper extremity supported;Feet supported Sitting balance-Leahy Scale: Fair Sitting balance - Comments: unable to don socks/shoes   Standing balance support: Bilateral upper extremity supported Standing balance-Leahy Scale: Poor Standing balance comment: dependent on walker                            Cognition Arousal/Alertness: Awake/alert Behavior During Therapy: WFL for tasks assessed/performed Overall Cognitive Status: Within Functional Limits for tasks assessed                                          Exercises      General Comments General comments (skin integrity, edema, etc.): NG tube clamped, abdominal incision not observed      Pertinent Vitals/Pain Pain Assessment: 0-10 Pain Score: 5  Faces Pain Scale: Hurts a little bit Pain Location: abdomen when getting up Pain Descriptors /  Indicators: Aching;Discomfort Pain Intervention(s): Other (comment) (gave pillow to help splint)    Home Living Family/patient expects to be discharged to:: Private residence Living Arrangements: Alone Available Help at Discharge: Family;Friend(s);Available PRN/intermittently Type of Home: House Home Access: Stairs to enter;Ramped entrance Entrance Stairs-Rails:  None Entrance Stairs-Number of Steps: 3   Home Layout: One level Home Equipment: Tub bench Additional Comments: pt says possiblily of friend living with him for assistance    Prior Function            PT Goals (current goals can now be found in the care plan section) Progress towards PT goals: Progressing toward goals    Frequency    Min 3X/week      PT Plan Current plan remains appropriate    Co-evaluation              AM-PAC PT "6 Clicks" Mobility   Outcome Measure  Help needed turning from your back to your side while in a flat bed without using bedrails?: A Lot Help needed moving from lying on your back to sitting on the side of a flat bed without using bedrails?: A Lot Help needed moving to and from a bed to a chair (including a wheelchair)?: A Little Help needed standing up from a chair using your arms (e.g., wheelchair or bedside chair)?: A Little Help needed to walk in hospital room?: A Little Help needed climbing 3-5 steps with a railing? : A Lot 6 Click Score: 15    End of Session Equipment Utilized During Treatment: Gait belt Activity Tolerance: Patient tolerated treatment well Patient left: in chair;with call bell/phone within reach (with OT present) Nurse Communication: Mobility status PT Visit Diagnosis: Unsteadiness on feet (R26.81);Muscle weakness (generalized) (M62.81);Difficulty in walking, not elsewhere classified (R26.2);Pain     Time: 1700-1749 PT Time Calculation (min) (ACUTE ONLY): 24 min  Charges:  $Gait Training: 8-22 mins $Therapeutic Activity: 8-22 mins                     Lewis Shock, PT, DPT Acute Rehabilitation Services Pager #: [phone] Office #: [phone]    Iona Hansen 03/09/2021, 11:15 AM

## 2021-03-10 ENCOUNTER — Inpatient Hospital Stay (HOSPITAL_COMMUNITY): Payer: Medicare Other

## 2021-03-10 DIAGNOSIS — K56609 Unspecified intestinal obstruction, unspecified as to partial versus complete obstruction: Secondary | ICD-10-CM | POA: Diagnosis not present

## 2021-03-10 LAB — CBC WITH DIFFERENTIAL/PLATELET
Abs Immature Granulocytes: 0.31 10*3/uL — ABNORMAL HIGH (ref 0.00–0.07)
Basophils Absolute: 0 10*3/uL (ref 0.0–0.1)
Basophils Relative: 0 %
Eosinophils Absolute: 0.3 10*3/uL (ref 0.0–0.5)
Eosinophils Relative: 2 %
HCT: 37.3 % — ABNORMAL LOW (ref 39.0–52.0)
Hemoglobin: 12.2 g/dL — ABNORMAL LOW (ref 13.0–17.0)
Immature Granulocytes: 2 %
Lymphocytes Relative: 11 %
Lymphs Abs: 1.4 10*3/uL (ref 0.7–4.0)
MCH: 22.3 pg — ABNORMAL LOW (ref 26.0–34.0)
MCHC: 32.7 g/dL (ref 30.0–36.0)
MCV: 68.3 fL — ABNORMAL LOW (ref 80.0–100.0)
Monocytes Absolute: 1.9 10*3/uL — ABNORMAL HIGH (ref 0.1–1.0)
Monocytes Relative: 14 %
Neutro Abs: 9.6 10*3/uL — ABNORMAL HIGH (ref 1.7–7.7)
Neutrophils Relative %: 71 %
Platelets: 367 10*3/uL (ref 150–400)
RBC: 5.46 MIL/uL (ref 4.22–5.81)
RDW: 14.3 % (ref 11.5–15.5)
WBC: 13.5 10*3/uL — ABNORMAL HIGH (ref 4.0–10.5)
nRBC: 0 % (ref 0.0–0.2)

## 2021-03-10 LAB — BASIC METABOLIC PANEL
Anion gap: 10 (ref 5–15)
BUN: 15 mg/dL (ref 8–23)
CO2: 19 mmol/L — ABNORMAL LOW (ref 22–32)
Calcium: 8.8 mg/dL — ABNORMAL LOW (ref 8.9–10.3)
Chloride: 104 mmol/L (ref 98–111)
Creatinine, Ser: 0.97 mg/dL (ref 0.61–1.24)
GFR, Estimated: >60 mL/min (ref >60)
Glucose, Bld: 143 mg/dL — ABNORMAL HIGH (ref 70–99)
Potassium: 3.6 mmol/L (ref 3.5–5.1)
Sodium: 133 mmol/L — ABNORMAL LOW (ref 135–145)

## 2021-03-10 LAB — GLUCOSE, CAPILLARY
Glucose-Capillary: 142 mg/dL — ABNORMAL HIGH (ref 70–99)
Glucose-Capillary: 142 mg/dL — ABNORMAL HIGH (ref 70–99)
Glucose-Capillary: 115 mg/dL — ABNORMAL HIGH (ref 70–99)
Glucose-Capillary: 119 mg/dL — ABNORMAL HIGH (ref 70–99)

## 2021-03-10 MED ORDER — LABETALOL HCL 5 MG/ML IV SOLN: 10.0000 mg | INTRAVENOUS | Status: DC | PRN

## 2021-03-10 MED ORDER — METOPROLOL TARTRATE 5 MG/5ML IV SOLN
2.5000 mg | Freq: Two times a day (BID) | INTRAVENOUS | Status: DC
  Administered 2021-03-10 – 2021-03-17 (×15): 2.5 mg via INTRAVENOUS
  Filled 2021-03-10 (×15): qty 5

## 2021-03-10 MED ORDER — LACTATED RINGERS IV BOLUS
500.0000 mL | Freq: Once | INTRAVENOUS | Status: AC
  Administered 2021-03-10: 500 mL via INTRAVENOUS

## 2021-03-10 MED ORDER — METHOCARBAMOL 1000 MG/10ML IJ SOLN
500.0000 mg | Freq: Four times a day (QID) | INTRAVENOUS | Status: DC | PRN
  Filled 2021-03-10: qty 5

## 2021-03-10 MED ORDER — IOHEXOL 300 MG/ML  SOLN
100.0000 mL | Freq: Once | INTRAMUSCULAR | Status: AC | PRN
  Administered 2021-03-10: 100 mL via INTRAVENOUS

## 2021-03-10 MED ORDER — POTASSIUM CHLORIDE 2 MEQ/ML IV SOLN
INTRAVENOUS | Status: DC
  Filled 2021-03-10 (×2): qty 1000

## 2021-03-10 MED ORDER — PANTOPRAZOLE SODIUM 40 MG IV SOLR
40.0000 mg | Freq: Two times a day (BID) | INTRAVENOUS | Status: DC
  Administered 2021-03-10 – 2021-03-20 (×21): 40 mg via INTRAVENOUS
  Filled 2021-03-10 (×21): qty 40

## 2021-03-10 MED ORDER — HYDROMORPHONE HCL 1 MG/ML IJ SOLN
0.5000 mg | Freq: Once | INTRAMUSCULAR | Status: AC
  Administered 2021-03-10: 0.5 mg via INTRAVENOUS
  Filled 2021-03-10: qty 0.5

## 2021-03-10 MED ORDER — IOHEXOL 9 MG/ML PO SOLN
ORAL | Status: AC
  Filled 2021-03-10: qty 1000

## 2021-03-10 MED ORDER — HYDRALAZINE HCL 20 MG/ML IJ SOLN: 10.0000 mg | Freq: Four times a day (QID) | INTRAMUSCULAR | Status: DC | PRN

## 2021-03-10 NOTE — Plan of Care (Signed)
  Problem: Education: Goal: Knowledge of General Education information will improve Description: Including pain rating scale, medication(s)/side effects and non-pharmacologic comfort measures Outcome: Progressing   Problem: Clinical Measurements: Goal: Ability to maintain clinical measurements within normal limits will improve Outcome: Progressing   Problem: Clinical Measurements: Goal: Cardiovascular complication will be avoided Outcome: Progressing   Problem: Activity: Goal: Risk for activity intolerance will decrease Outcome: Progressing   Problem: Nutrition: Goal: Adequate nutrition will be maintained Outcome: Progressing   Problem: Coping: Goal: Level of anxiety will decrease Outcome: Progressing   Problem: Elimination: Goal: Will not experience complications related to urinary retention Outcome: Progressing   Problem: Elimination: Goal: Will not experience complications related to bowel motility Outcome: Progressing   Problem: Pain Managment: Goal: General experience of comfort will improve Outcome: Progressing   Problem: Safety: Goal: Ability to remain free from injury will improve Outcome: Progressing   Problem: Skin Integrity: Goal: Risk for impaired skin integrity will decrease Outcome: Progressing

## 2021-03-10 NOTE — Progress Notes (Signed)
Patient NGT became dislodged while getting up to the edge of bed. Tube seen to be clogged at the tip and may be the reason it was not working properly. New 16Fr NGT placed without complication. External measurement from tip of nose is 64cm. KUB order placed for stat. Will return to [facility] once placement is confirmed. MD and CCS PA notified.

## 2021-03-10 NOTE — Progress Notes (Signed)
   03/10/21 1630  Mobility  Activity Refused mobility (Patient declined ambulation d/t not feeling well)

## 2021-03-10 NOTE — Progress Notes (Signed)
Per Dr Jayme Cloud, ok to advance NGT about 5cm more.

## 2021-03-10 NOTE — Progress Notes (Signed)
PT Cancellation Note  Patient Details Name: Daniel Perkins MRN: [id number] DOB: [birth year]   Cancelled Treatment:    Reason Eval/Treat Not Completed: Other (comment) Pt reports not feeling well at all today and nauseated. Per RN NG tube became dislodged, suspect it wasn't working all night as when she put it back in it drained . PT to return as able to progress mobility.  Lewis Shock, PT, DPT Acute Rehabilitation Services Pager #: [phone] Office #: [phone]    Iona Hansen 03/10/2021, 1:07 PM

## 2021-03-10 NOTE — Progress Notes (Signed)
Patient ID: Dayven Linsley, male   DOB: [birth year], 65 y.o.   MRN: [id number] [facility] Surgery Progress Note  5 Days Post-Op  Subjective: CC-  Abdomen w/ cramping lower abdominal pain. Reports a couple of bouts of nausea last night, along with nausea this morning during my exam. He is belching. States he may have had two small bouts of flatus last night. No BM.  Objective: Vital signs in last 24 hours: Temp:  [98.2 F (36.8 C)-98.9 F (37.2 C)] 98.6 F (37 C) (11/02 0813) Pulse Rate:  [85-96] 87 (11/02 0813) Resp:  [16-18] 18 (11/02 0813) BP: (137-156)/(85-94) 156/85 (11/02 0813) SpO2:  [89 %-93 %] 91 % (11/02 0813) Last BM Date: 03/02/21  Intake/Output from previous day: 11/01 0701 - 11/02 0700 In: 360 [P.O.:360] Out: 550 [Urine:550] Intake/Output this shift: Total I/O In: 200 [P.O.:200] Out: 200 [Urine:200]  PE: Gen:  Alert, NAD, pleasant HEENT: NG tube with light green bilious drainage Card:  RRR Pulm:  normal effort on room air Abd: soft, mild distension, + BS, lap incisions with cdi dressings, midline mini laparotomy cdi with staples present and no erythema or drainage and honeycomb dressing in place  Lab Results:  Recent Labs    03/09/21 0820 03/10/21 0149  WBC 14.2* 13.5*  HGB 12.6* 12.2*  HCT 39.3 37.3*  PLT 369 367   BMET Recent Labs    03/09/21 0155 03/10/21 0149  NA 134* 133*  K 3.7 3.6  CL 105 104  CO2 20* 19*  GLUCOSE 119* 143*  BUN 15 15  CREATININE 1.04 0.97  CALCIUM 8.8* 8.8*   PT/INR No results for input(s): LABPROT, INR in the last 72 hours. CMP     Component Value Date/Time   NA 133 (L) 03/10/2021 0149   NA 137 02/10/2021 1538   K 3.6 03/10/2021 0149   CL 104 03/10/2021 0149   CO2 19 (L) 03/10/2021 0149   GLUCOSE 143 (H) 03/10/2021 0149   BUN 15 03/10/2021 0149   BUN 13 02/10/2021 1538   CREATININE 0.97 03/10/2021 0149   CREATININE 1.22 06/30/2014 1634   CALCIUM 8.8 (L) 03/10/2021 0149   PROT 8.0 03/04/2021 0313    PROT 7.2 12/06/2018 1648   ALBUMIN 3.5 03/04/2021 0313   ALBUMIN 4.2 12/06/2018 1648   AST 20 03/04/2021 0313   ALT 14 03/04/2021 0313   ALKPHOS 93 03/04/2021 0313   BILITOT 1.0 03/04/2021 0313   BILITOT 0.3 12/06/2018 1648   GFRNONAA >60 03/10/2021 0149   GFRNONAA 78 10/23/2013 1554   GFRAA 86 06/09/2020 1508   GFRAA >89 10/23/2013 1554   Lipase  No results found for: LIPASE     Studies/Results: No results found.  Anti-infectives: Anti-infectives (From admission, onward)    Start     Dose/Rate Route Frequency Ordered Stop   03/05/21 0845  cefoTEtan (CEFOTAN) 2 g in sodium chloride 0.9 % 100 mL IVPB        2 g 200 mL/hr over 30 Minutes Intravenous To [facility] 03/04/21 2206 03/05/21 1035        Assessment/Plan SBO with no previous abdominal surgical history  POD#5 S/P diagnostic laparoscopy, lysis of single adhesive band in the left lower quadrant with resolution of bowel obstruction, mini laparotomy with primary repair of 2 mm small bowel perforation on 03/05/21 Dr. Fredricka Bonine -  Return NG to [facility] and await  further bowel function - Mobilize. Working with PT/OT who are currently recommending CIR - WBC 13, stable, afebrile. Monitor. If  patient has persistent leukocytosis and ileus will consider CT scan in 24-48 hours   ID - cefotetan periop 10/28 VTE - SCDs, sq heparin FEN - IVF, NGT to LIWS, NPO/ice/sips /w meds, will need TPN Friday if he does not open up Foley - none   DM HTN CAD h/o MI several years ago - followed by Dr. Eldridge Dace PAD HLD Chronic neck pain   LOS: 6 days    Adam Phenix, [facility] Surgery 03/10/2021, 10:27 AM Please see Amion for pager number during day hours 7:00am-4:30pm

## 2021-03-10 NOTE — Progress Notes (Addendum)
PROGRESS NOTE    Daniel Perkins  MLY:[id number] DOB: 1956-04-30 DOA: 03/04/2021 PCP: Ellyn Hack, MD   Brief Narrative: 65 year old with past medical history significant for hypertension, diabetes type 2, CAD, peripheral artery disease, hyperlipidemia, presented with acute onset of abdominal pain and found to have a small bowel obstruction with transition zone on the left side.  Admitted with surgical consultation.  Seen by cardiology for preop clearance. Daniel Perkins underwent ex lap, small bowel perforation repair on 10/28.  Post operative, patient with persistent ileus.  Plan to continue with NG tube.    Assessment & Plan:   Active Problems:   Diabetes mellitus due to underlying condition, uncontrolled, with hyperglycemia (HCC)   Hyperlipemia   HYPERTENSION   Gout   SBO (small bowel obstruction) (HCC)  1-SBO due to adhesion of the small bowel and perforation. Postoperative persistent ileus: Plan to continue with NG tube, patient more distended today and with nausea. Plan to resume IV fluids.  2-Diabetes type 2: Continue to hold Victoza and Jardiance at home. With a sliding scale insulin. Check CBG, patient n.p.o. to monitor for hypoglycemia  3-HTN;  Currently NOP, holding Oral meds.  Daniel Perkins was on carvedilol at home, will order IV metoprolol schedule.  PRN hydralazine PRN.   4-History of CAD: Holding aspirin. Hyperlipidemia: Holding statins resume when able to take oral AKI: Improved with IV fluids.    Estimated body mass index is 32.73 kg/m as calculated from the following:   Height as of this encounter: 5\' 7"  (1.702 m).   Weight as of this encounter: 94.8 kg.   DVT prophylaxis: Hold heparin for Code Status: Full code Family Communication: Discussed with patient Disposition Plan:  Status is: Inpatient  Remains inpatient appropriate because: Persistent ileus post op        Consultants:  General surgery  Procedures:  Diagnostic laparoscopy, lysis of  single addition band in the left lower quadrant breast solution of with obstruction, mini laparotomy with primary repair of 2 mm small bowel perforation on 10/28 by Dr. 11/28  Antimicrobials:  None  Subjective: Daniel Perkins is alert, Daniel Perkins is feeling comfortable today, report nausea, worsening abdominal distention.  NG tube has been placed back to suction   Objective: Vitals:   03/09/21 1500 03/09/21 2015 03/10/21 0439 03/10/21 0813  BP: (!) 150/94 137/85 (!) 153/89 (!) 156/85  Pulse: 96 90 85 87  Resp: 16 18 18 18   Temp: 98.9 F (37.2 C) 98.2 F (36.8 C) 98.5 F (36.9 C) 98.6 F (37 C)  TempSrc: Oral Oral Oral Oral  SpO2: 93% 91% (!) 89% 91%  Weight:      Height:        Intake/Output Summary (Last 24 hours) at 03/10/2021 1036 Last data filed at 03/10/2021 0713 Gross per 24 hour  Intake 560 ml  Output 750 ml  Net -190 ml   Filed Weights   03/04/21 0256  Weight: 94.8 kg    Examination:  General exam: Appears calm and comfortable  Respiratory system: Clear to auscultation. Respiratory effort normal. Cardiovascular system: S1 & S2 heard, RRR. No JVD, murmurs, rubs, gallops or clicks. No pedal edema. Gastrointestinal system: Abdomen is distended, mild tender, incisions clean and covered Central nervous system: Alert and oriented. No focal neurological deficits. Extremities: Symmetric 5 x 5 power.     Data Reviewed: I have personally reviewed following labs and imaging studies  CBC: Recent Labs  Lab 03/04/21 0313 03/06/21 0858 03/08/21 0055 03/09/21 0820 03/10/21 0149  WBC 9.2  11.5* 12.7* 14.2* 13.5*  NEUTROABS 6.6  --  9.0*  --  9.6*  HGB 13.0 12.0* 11.7* 12.6* 12.2*  HCT 41.5 39.0 37.8* 39.3 37.3*  MCV 72.4* 72.4* 72.7* 69.2* 68.3*  PLT 347 286 287 369 367   Basic Metabolic Panel: Recent Labs  Lab 03/05/21 0307 03/06/21 0858 03/08/21 0055 03/09/21 0155 03/10/21 0149  NA 133* 135 133* 134* 133*  K 4.1 4.1 3.9 3.7 3.6  CL 100 103 102 105 104  CO2 20* 24 22  20* 19*  GLUCOSE 169* 139* 118* 119* 143*  BUN 24* 32* 16 15 15   CREATININE 1.39* 1.57* 1.14 1.04 0.97  CALCIUM 9.2 8.4* 8.9 8.8* 8.8*  MG  --   --  2.5*  --   --   PHOS  --   --  3.0  --   --    GFR: Estimated Creatinine Clearance: 84.4 mL/min (by C-G formula based on SCr of 0.97 mg/dL). Liver Function Tests: Recent Labs  Lab 03/04/21 0313  AST 20  ALT 14  ALKPHOS 93  BILITOT 1.0  PROT 8.0  ALBUMIN 3.5   No results for input(s): LIPASE, AMYLASE in the last 168 hours. No results for input(s): AMMONIA in the last 168 hours. Coagulation Profile: No results for input(s): INR, PROTIME in the last 168 hours. Cardiac Enzymes: No results for input(s): CKTOTAL, CKMB, CKMBINDEX, TROPONINI in the last 168 hours. BNP (last 3 results) No results for input(s): PROBNP in the last 8760 hours. HbA1C: No results for input(s): HGBA1C in the last 72 hours. CBG: Recent Labs  Lab 03/09/21 0809 03/09/21 1250 03/09/21 1618 03/09/21 2014 03/10/21 0816  GLUCAP 132* 115* 138* 118* 142*   Lipid Profile: No results for input(s): CHOL, HDL, LDLCALC, TRIG, CHOLHDL, LDLDIRECT in the last 72 hours. Thyroid Function Tests: No results for input(s): TSH, T4TOTAL, FREET4, T3FREE, THYROIDAB in the last 72 hours. Anemia Panel: No results for input(s): VITAMINB12, FOLATE, FERRITIN, TIBC, IRON, RETICCTPCT in the last 72 hours. Sepsis Labs: Recent Labs  Lab 03/04/21 0313  LATICACIDVEN 1.4    Recent Results (from the past 240 hour(s))  Resp Panel by RT-PCR (Flu A&B, Covid) Nasopharyngeal Swab     Status: None   Collection Time: 03/04/21  7:44 AM   Specimen: Nasopharyngeal Swab; Nasopharyngeal(NP) swabs in vial transport medium  Result Value Ref Range Status   SARS Coronavirus 2 by RT PCR NEGATIVE NEGATIVE Final    Comment: (NOTE) SARS-CoV-2 target nucleic acids are NOT DETECTED.  The SARS-CoV-2 RNA is generally detectable in upper respiratory specimens during the acute phase of infection. The  lowest concentration of SARS-CoV-2 viral copies this assay can detect is 138 copies/mL. A negative result does not preclude SARS-Cov-2 infection and should not be used as the sole basis for treatment or other patient management decisions. A negative result may occur with  improper specimen collection/handling, submission of specimen other than nasopharyngeal swab, presence of viral mutation(s) within the areas targeted by this assay, and inadequate number of viral copies(<138 copies/mL). A negative result must be combined with clinical observations, patient history, and epidemiological information. The expected result is Negative.  Fact Sheet for Patients:  03/06/21  Fact Sheet for Healthcare Providers:  BloggerCourse.com  This test is no t yet approved or cleared by the [place] FDA and  has been authorized for detection and/or diagnosis of SARS-CoV-2 by FDA under an Emergency Use Authorization (EUA). This EUA will remain  in effect (meaning this test can be  used) for the duration of the COVID-19 declaration under Section 564(b)(1) of the Act, 21 U.S.C.section 360bbb-3(b)(1), unless the authorization is terminated  or revoked sooner.       Influenza A by PCR NEGATIVE NEGATIVE Final   Influenza B by PCR NEGATIVE NEGATIVE Final    Comment: (NOTE) The Xpert Xpress SARS-CoV-2/FLU/RSV plus assay is intended as an aid in the diagnosis of influenza from Nasopharyngeal swab specimens and should not be used as a sole basis for treatment. Nasal washings and aspirates are unacceptable for Xpert Xpress SARS-CoV-2/FLU/RSV testing.  Fact Sheet for Patients: BloggerCourse.com  Fact Sheet for Healthcare Providers: SeriousBroker.it  This test is not yet approved or cleared by the [place] FDA and has been authorized for detection and/or diagnosis of SARS-CoV-2 by FDA under  an Emergency Use Authorization (EUA). This EUA will remain in effect (meaning this test can be used) for the duration of the COVID-19 declaration under Section 564(b)(1) of the Act, 21 U.S.C. section 360bbb-3(b)(1), unless the authorization is terminated or revoked.  Performed at [facility], [address], [place], [place] [address]   Surgical pcr screen     Status: None   Collection Time: 03/05/21  2:58 AM   Specimen: Nasal Mucosa; Nasal Swab  Result Value Ref Range Status   MRSA, PCR NEGATIVE NEGATIVE Final   Staphylococcus aureus NEGATIVE NEGATIVE Final    Comment: (NOTE) The Xpert SA Assay (FDA approved for NASAL specimens in patients 9 years of age and older), is one component of a comprehensive surveillance program. It is not intended to diagnose infection nor to guide or monitor treatment. Performed at [facility], [address], [place], [place] [address]          Radiology Studies: No results found.      Scheduled Meds:  insulin aspart  0-20 Units Subcutaneous TID WC   lidocaine  1 patch Transdermal Q24H   pantoprazole (PROTONIX) IV  40 mg Intravenous Q12H   Continuous Infusions:  lactated ringers with kcl     methocarbamol (ROBAXIN) IV       LOS: 6 days    Time spent: 35 minutes    Pinkey Mcjunkin A Quanta Roher, MD [facility]   If 7PM-7AM, please contact night-coverage [email or web address]  03/10/2021, 10:36 AM

## 2021-03-11 ENCOUNTER — Inpatient Hospital Stay: Payer: Self-pay

## 2021-03-11 DIAGNOSIS — K56609 Unspecified intestinal obstruction, unspecified as to partial versus complete obstruction: Secondary | ICD-10-CM | POA: Diagnosis not present

## 2021-03-11 DIAGNOSIS — E440 Moderate protein-calorie malnutrition: Secondary | ICD-10-CM | POA: Insufficient documentation

## 2021-03-11 LAB — CBC WITH DIFFERENTIAL/PLATELET
Abs Immature Granulocytes: 0.47 10*3/uL — ABNORMAL HIGH (ref 0.00–0.07)
Basophils Absolute: 0.1 10*3/uL (ref 0.0–0.1)
Basophils Relative: 0 %
Eosinophils Absolute: 0.2 10*3/uL (ref 0.0–0.5)
Eosinophils Relative: 1 %
HCT: 35.8 % — ABNORMAL LOW (ref 39.0–52.0)
Hemoglobin: 11.6 g/dL — ABNORMAL LOW (ref 13.0–17.0)
Immature Granulocytes: 4 %
Lymphocytes Relative: 17 %
Lymphs Abs: 2.1 10*3/uL (ref 0.7–4.0)
MCH: 22.4 pg — ABNORMAL LOW (ref 26.0–34.0)
MCHC: 32.4 g/dL (ref 30.0–36.0)
MCV: 69 fL — ABNORMAL LOW (ref 80.0–100.0)
Monocytes Absolute: 1.8 10*3/uL — ABNORMAL HIGH (ref 0.1–1.0)
Monocytes Relative: 15 %
Neutro Abs: 7.7 10*3/uL (ref 1.7–7.7)
Neutrophils Relative %: 63 %
Platelets: 396 10*3/uL (ref 150–400)
RBC: 5.19 MIL/uL (ref 4.22–5.81)
RDW: 14.3 % (ref 11.5–15.5)
WBC: 12.3 10*3/uL — ABNORMAL HIGH (ref 4.0–10.5)
nRBC: 0 % (ref 0.0–0.2)

## 2021-03-11 LAB — GLUCOSE, CAPILLARY
Glucose-Capillary: 114 mg/dL — ABNORMAL HIGH (ref 70–99)
Glucose-Capillary: 115 mg/dL — ABNORMAL HIGH (ref 70–99)
Glucose-Capillary: 121 mg/dL — ABNORMAL HIGH (ref 70–99)
Glucose-Capillary: 121 mg/dL — ABNORMAL HIGH (ref 70–99)
Glucose-Capillary: 129 mg/dL — ABNORMAL HIGH (ref 70–99)
Glucose-Capillary: 130 mg/dL — ABNORMAL HIGH (ref 70–99)
Glucose-Capillary: 99 mg/dL (ref 70–99)

## 2021-03-11 LAB — BASIC METABOLIC PANEL
Anion gap: 8 (ref 5–15)
BUN: 15 mg/dL (ref 8–23)
CO2: 22 mmol/L (ref 22–32)
Calcium: 8.6 mg/dL — ABNORMAL LOW (ref 8.9–10.3)
Chloride: 104 mmol/L (ref 98–111)
Creatinine, Ser: 1.01 mg/dL (ref 0.61–1.24)
GFR, Estimated: >60 mL/min (ref >60)
Glucose, Bld: 119 mg/dL — ABNORMAL HIGH (ref 70–99)
Potassium: 3.7 mmol/L (ref 3.5–5.1)
Sodium: 134 mmol/L — ABNORMAL LOW (ref 135–145)

## 2021-03-11 LAB — PHOSPHORUS: Phosphorus: 3.8 mg/dL (ref 2.5–4.6)

## 2021-03-11 LAB — URIC ACID: Uric Acid, Serum: 6.8 mg/dL (ref 3.7–8.6)

## 2021-03-11 LAB — MAGNESIUM: Magnesium: 2.2 mg/dL (ref 1.7–2.4)

## 2021-03-11 MED ORDER — DICLOFENAC SODIUM 1 % EX GEL
2.0000 g | Freq: Four times a day (QID) | CUTANEOUS | Status: DC
  Administered 2021-03-11 – 2021-03-25 (×47): 2 g via TOPICAL
  Filled 2021-03-11: qty 100

## 2021-03-11 MED ORDER — SODIUM CHLORIDE 0.9% FLUSH
10.0000 mL | Freq: Two times a day (BID) | INTRAVENOUS | Status: DC
  Administered 2021-03-11 – 2021-03-12 (×2): 10 mL
  Administered 2021-03-12: 30 mL
  Administered 2021-03-13 – 2021-03-14 (×3): 10 mL
  Administered 2021-03-16 – 2021-03-18 (×3): 20 mL
  Administered 2021-03-19 – 2021-03-23 (×5): 10 mL

## 2021-03-11 MED ORDER — SODIUM CHLORIDE 0.9 % IV SOLN: INTRAVENOUS | Status: AC

## 2021-03-11 MED ORDER — CHLORHEXIDINE GLUCONATE CLOTH 2 % EX PADS
6.0000 | MEDICATED_PAD | Freq: Every day | CUTANEOUS | Status: DC
  Administered 2021-03-11 – 2021-03-25 (×15): 6 via TOPICAL

## 2021-03-11 MED ORDER — INSULIN ASPART 100 UNIT/ML IJ SOLN
0.0000 [IU] | INTRAMUSCULAR | Status: DC
  Administered 2021-03-11 (×2): 3 [IU] via SUBCUTANEOUS
  Administered 2021-03-12 (×2): 4 [IU] via SUBCUTANEOUS
  Administered 2021-03-12 – 2021-03-13 (×4): 3 [IU] via SUBCUTANEOUS
  Administered 2021-03-13 (×2): 4 [IU] via SUBCUTANEOUS
  Administered 2021-03-13: 3 [IU] via SUBCUTANEOUS
  Administered 2021-03-13: 4 [IU] via SUBCUTANEOUS
  Administered 2021-03-13: 3 [IU] via SUBCUTANEOUS
  Administered 2021-03-14 (×6): 4 [IU] via SUBCUTANEOUS
  Administered 2021-03-14: 7 [IU] via SUBCUTANEOUS
  Administered 2021-03-15 (×4): 4 [IU] via SUBCUTANEOUS
  Administered 2021-03-15 – 2021-03-16 (×2): 7 [IU] via SUBCUTANEOUS
  Administered 2021-03-16: 4 [IU] via SUBCUTANEOUS
  Administered 2021-03-16: 7 [IU] via SUBCUTANEOUS
  Administered 2021-03-16 – 2021-03-17 (×5): 4 [IU] via SUBCUTANEOUS
  Administered 2021-03-17 (×4): 7 [IU] via SUBCUTANEOUS
  Administered 2021-03-18 (×2): 4 [IU] via SUBCUTANEOUS
  Administered 2021-03-18: 7 [IU] via SUBCUTANEOUS
  Administered 2021-03-18 (×2): 3 [IU] via SUBCUTANEOUS
  Administered 2021-03-18: 4 [IU] via SUBCUTANEOUS
  Administered 2021-03-19: 3 [IU] via SUBCUTANEOUS
  Administered 2021-03-19 (×2): 4 [IU] via SUBCUTANEOUS
  Administered 2021-03-19: 2 [IU] via SUBCUTANEOUS
  Administered 2021-03-19: 3 [IU] via SUBCUTANEOUS
  Administered 2021-03-19 (×2): 4 [IU] via SUBCUTANEOUS
  Administered 2021-03-20 (×2): 3 [IU] via SUBCUTANEOUS

## 2021-03-11 MED ORDER — POTASSIUM CHLORIDE 10 MEQ/100ML IV SOLN
10.0000 meq | INTRAVENOUS | Status: AC
  Administered 2021-03-11 (×4): 10 meq via INTRAVENOUS
  Filled 2021-03-11 (×4): qty 100

## 2021-03-11 MED ORDER — PHENOL 1.4 % MT LIQD
1.0000 | OROMUCOSAL | Status: DC | PRN
  Administered 2021-03-11: 1 via OROMUCOSAL
  Filled 2021-03-11: qty 177

## 2021-03-11 MED ORDER — SODIUM CHLORIDE 0.9% FLUSH
10.0000 mL | INTRAVENOUS | Status: DC | PRN
  Administered 2021-03-13: 10 mL

## 2021-03-11 MED ORDER — PIPERACILLIN-TAZOBACTAM 3.375 G IVPB
3.3750 g | Freq: Three times a day (TID) | INTRAVENOUS | Status: DC
  Administered 2021-03-11 – 2021-03-18 (×22): 3.375 g via INTRAVENOUS
  Filled 2021-03-11 (×22): qty 50

## 2021-03-11 MED ORDER — TRAVASOL 10 % IV SOLN
INTRAVENOUS | Status: AC
  Filled 2021-03-11: qty 556.8

## 2021-03-11 MED ORDER — TRAVASOL 10 % IV SOLN
INTRAVENOUS | Status: DC
  Filled 2021-03-11: qty 556.8

## 2021-03-11 NOTE — Progress Notes (Signed)
Inpatient Rehab Admissions Coordinator:   Still with NG to suction.  Will continue to follow.   Estill Dooms, PT, DPT Admissions Coordinator [phone] 03/11/21  12:33 PM

## 2021-03-11 NOTE — Progress Notes (Signed)
PROGRESS NOTE    Daniel Perkins  DVV:[id number] DOB: Sep 27, 1955 DOA: 03/04/2021 PCP: Ellyn Hack, MD   Brief Narrative: 65 year old with past medical history significant for hypertension, diabetes type 2, CAD, peripheral artery disease, hyperlipidemia, presented with acute onset of abdominal pain and found to have a small bowel obstruction with transition zone on the left side.  Admitted with surgical consultation.  Seen by cardiology for preop clearance. He underwent ex lap, small bowel perforation repair on 10/28.  Post operative, patient with persistent ileus.  Plan to continue with NG tube.    Assessment & Plan:   Active Problems:   Diabetes mellitus due to underlying condition, uncontrolled, with hyperglycemia (HCC)   Hyperlipemia   HYPERTENSION   Gout   SBO (small bowel obstruction) (HCC)  1-SBO due to adhesion of the small bowel and perforation. -Postoperative persistent ileus: -Plan to continue with NG tube, patient more distended today and with nausea. -Started on TPN 11/03. -CT abdomen showed: Moderate length segment small bowel in the right abdomen demonstrating wall thickening and mesenteric edema worrisome for nonspecific enteritis including infectious, inflammatory and ischemic etiologies. Small bowel loops proximal to this level are dilated, but oral contrast is seen distal to this level. Findings are compatible with partial small bowel obstruction. New enhancing fluid collection in the pelvis may represent abscess. -Started on IV zosyn.   2-Diabetes type 2: Continue to hold Victoza and Jardiance at home. Continue with a sliding scale insulin.  3-HTN;  Currently NOP, holding Oral meds.  He was on carvedilol at home, will order IV metoprolol schedule.  PRN hydralazine PRN.   4-History of CAD: Holding aspirin. Hyperlipidemia: Holding statins resume when able to take oral AKI: Improved with IV fluids. Feet pain; check uric acid. Start voltaren gel.     Estimated body mass index is 32.73 kg/m as calculated from the following:   Height as of this encounter: 5\' 7"  (1.702 m).   Weight as of this encounter: 94.8 kg.   DVT prophylaxis: Hold heparin for Code Status: Full code Family Communication: Discussed with patient Disposition Plan:  Status is: Inpatient  Remains inpatient appropriate because: Persistent ileus post op        Consultants:  General surgery  Procedures:  Diagnostic laparoscopy, lysis of single addition band in the left lower quadrant breast solution of with obstruction, mini laparotomy with primary repair of 2 mm small bowel perforation on 10/28 by Dr. 11/28  Antimicrobials:  None  Subjective: He feels better today, less nausea.  Still having abdominal pain.   Objective: Vitals:   03/10/21 1403 03/10/21 1602 03/10/21 2036 03/11/21 0423  BP: (!) 149/94 118/84 119/78 (!) 150/75  Pulse: 86 81 84 83  Resp: 16 18 18 18   Temp: 99.5 F (37.5 C) 98 F (36.7 C) 98.6 F (37 C) 97.8 F (36.6 C)  TempSrc: Oral Oral Oral Oral  SpO2: 97% 96% 92% 93%  Weight:      Height:        Intake/Output Summary (Last 24 hours) at 03/11/2021 0751 Last data filed at 03/11/2021 0600 Gross per 24 hour  Intake 1606.64 ml  Output 3175 ml  Net -1568.36 ml    Filed Weights   03/04/21 0256  Weight: 94.8 kg    Examination:  General exam: NAD Respiratory system:CTA Cardiovascular system: S 1, S 2 RRR Gastrointestinal system: BS decreased, abdomen distended, Tender, incision cover  Central nervous system: alert, alert Extremities:      Data Reviewed: I  have personally reviewed following labs and imaging studies  CBC: Recent Labs  Lab 03/06/21 0858 03/08/21 0055 03/09/21 0820 03/10/21 0149 03/11/21 0045  WBC 11.5* 12.7* 14.2* 13.5* 12.3*  NEUTROABS  --  9.0*  --  9.6* 7.7  HGB 12.0* 11.7* 12.6* 12.2* 11.6*  HCT 39.0 37.8* 39.3 37.3* 35.8*  MCV 72.4* 72.7* 69.2* 68.3* 69.0*  PLT 286 287 369 367 396     Basic Metabolic Panel: Recent Labs  Lab 03/06/21 0858 03/08/21 0055 03/09/21 0155 03/10/21 0149 03/11/21 0045  NA 135 133* 134* 133* 134*  K 4.1 3.9 3.7 3.6 3.7  CL 103 102 105 104 104  CO2 24 22 20* 19* 22  GLUCOSE 139* 118* 119* 143* 119*  BUN 32* 16 15 15 15   CREATININE 1.57* 1.14 1.04 0.97 1.01  CALCIUM 8.4* 8.9 8.8* 8.8* 8.6*  MG  --  2.5*  --   --  2.2  PHOS  --  3.0  --   --   --     GFR: Estimated Creatinine Clearance: 81.1 mL/min (by C-G formula based on SCr of 1.01 mg/dL). Liver Function Tests: No results for input(s): AST, ALT, ALKPHOS, BILITOT, PROT, ALBUMIN in the last 168 hours.  No results for input(s): LIPASE, AMYLASE in the last 168 hours. No results for input(s): AMMONIA in the last 168 hours. Coagulation Profile: No results for input(s): INR, PROTIME in the last 168 hours. Cardiac Enzymes: No results for input(s): CKTOTAL, CKMB, CKMBINDEX, TROPONINI in the last 168 hours. BNP (last 3 results) No results for input(s): PROBNP in the last 8760 hours. HbA1C: No results for input(s): HGBA1C in the last 72 hours. CBG: Recent Labs  Lab 03/10/21 1206 03/10/21 1559 03/10/21 2035 03/11/21 0124 03/11/21 0419  GLUCAP 142* 115* 119* 121* 115*    Lipid Profile: No results for input(s): CHOL, HDL, LDLCALC, TRIG, CHOLHDL, LDLDIRECT in the last 72 hours. Thyroid Function Tests: No results for input(s): TSH, T4TOTAL, FREET4, T3FREE, THYROIDAB in the last 72 hours. Anemia Panel: No results for input(s): VITAMINB12, FOLATE, FERRITIN, TIBC, IRON, RETICCTPCT in the last 72 hours. Sepsis Labs: No results for input(s): PROCALCITON, LATICACIDVEN in the last 168 hours.   Recent Results (from the past 240 hour(s))  Resp Panel by RT-PCR (Flu A&B, Covid) Nasopharyngeal Swab     Status: None   Collection Time: 03/04/21  7:44 AM   Specimen: Nasopharyngeal Swab; Nasopharyngeal(NP) swabs in vial transport medium  Result Value Ref Range Status   SARS Coronavirus 2  by RT PCR NEGATIVE NEGATIVE Final    Comment: (NOTE) SARS-CoV-2 target nucleic acids are NOT DETECTED.  The SARS-CoV-2 RNA is generally detectable in upper respiratory specimens during the acute phase of infection. The lowest concentration of SARS-CoV-2 viral copies this assay can detect is 138 copies/mL. A negative result does not preclude SARS-Cov-2 infection and should not be used as the sole basis for treatment or other patient management decisions. A negative result may occur with  improper specimen collection/handling, submission of specimen other than nasopharyngeal swab, presence of viral mutation(s) within the areas targeted by this assay, and inadequate number of viral copies(<138 copies/mL). A negative result must be combined with clinical observations, patient history, and epidemiological information. The expected result is Negative.  Fact Sheet for Patients:  03/06/21  Fact Sheet for Healthcare Providers:  BloggerCourse.com  This test is no t yet approved or cleared by the [place] FDA and  has been authorized for detection and/or diagnosis of SARS-CoV-2 by  FDA under an Emergency Use Authorization (EUA). This EUA will remain  in effect (meaning this test can be used) for the duration of the COVID-19 declaration under Section 564(b)(1) of the Act, 21 U.S.C.section 360bbb-3(b)(1), unless the authorization is terminated  or revoked sooner.       Influenza A by PCR NEGATIVE NEGATIVE Final   Influenza B by PCR NEGATIVE NEGATIVE Final    Comment: (NOTE) The Xpert Xpress SARS-CoV-2/FLU/RSV plus assay is intended as an aid in the diagnosis of influenza from Nasopharyngeal swab specimens and should not be used as a sole basis for treatment. Nasal washings and aspirates are unacceptable for Xpert Xpress SARS-CoV-2/FLU/RSV testing.  Fact Sheet for Patients: BloggerCourse.com  Fact  Sheet for Healthcare Providers: SeriousBroker.it  This test is not yet approved or cleared by the [place] FDA and has been authorized for detection and/or diagnosis of SARS-CoV-2 by FDA under an Emergency Use Authorization (EUA). This EUA will remain in effect (meaning this test can be used) for the duration of the COVID-19 declaration under Section 564(b)(1) of the Act, 21 U.S.C. section 360bbb-3(b)(1), unless the authorization is terminated or revoked.  Performed at [facility], [address], [place], [place] [address]   Surgical pcr screen     Status: None   Collection Time: 03/05/21  2:58 AM   Specimen: Nasal Mucosa; Nasal Swab  Result Value Ref Range Status   MRSA, PCR NEGATIVE NEGATIVE Final   Staphylococcus aureus NEGATIVE NEGATIVE Final    Comment: (NOTE) The Xpert SA Assay (FDA approved for NASAL specimens in patients 46 years of age and older), is one component of a comprehensive surveillance program. It is not intended to diagnose infection nor to guide or monitor treatment. Performed at [facility], [address] [place], [place] [address]           Radiology Studies: DG Abd 1 View  Result Date: 03/10/2021 CLINICAL DATA:  Feeding tube placement EXAM: ABDOMEN - 1 VIEW COMPARISON:  03/05/2021 FINDINGS: Large-bore esophagogastric tube is positioned with tip below the diaphragm, in the gastric fundus, and side port near the level of the gastroesophageal junction. Recommend advancement to ensure subdiaphragmatic positioning. There is no enteric feeding tube identified in the lower chest or abdomen per reported exam indication. Gas-filled, nondistended loops of small bowel and colon in the included upper abdomen. IMPRESSION: 1. Large-bore esophagogastric tube is positioned with tip below the diaphragm, in the gastric fundus, and side port near the level of the gastroesophageal junction. Recommend advancement to ensure  subdiaphragmatic positioning of tip and side port. 2. No enteric feeding tube identified in the lower chest or abdomen per reported exam indication. Electronically Signed   By: Jearld Lesch M.D.   On: 03/10/2021 12:20   CT ABDOMEN PELVIS W CONTRAST  Result Date: 03/10/2021 CLINICAL DATA:  Abdominal distension with increased pain. Five days status post exploratory laparotomy and small bowel perforation. EXAM: CT ABDOMEN AND PELVIS WITH CONTRAST TECHNIQUE: Multidetector CT imaging of the abdomen and pelvis was performed using the standard protocol following bolus administration of intravenous contrast. CONTRAST:  OMNIPAQUE IOHEXOL 300 MG/ML  SOLN COMPARISON:  CT abdomen and pelvis 03/04/2021. FINDINGS: Lower chest: There are new bands of atelectasis/airspace disease in the bilateral lower lobes. Mild emphysematous changes are present. Hepatobiliary: Hyperdensity in the gallbladder is new from prior, likely excreted contrast. No radiopaque gallstones or bile duct dilatation. No focal liver lesions are identified. Pancreas: Unremarkable. No pancreatic ductal dilatation or  surrounding inflammatory changes. Spleen: Normal in size without focal abnormality. Adrenals/Urinary Tract: Adrenal glands are unremarkable. Kidneys are normal, without renal calculi, focal lesion, or hydronephrosis. Bladder is unremarkable. Stomach/Bowel: There is a moderate length segment of small bowel in the right abdomen demonstrating circumferential wall thickening/edema with mesenteric edema. There is no pneumatosis. Small bowel loops proximal to this level are dilated measuring up to 3.8 cm. Small bowel loops distal to this level are nondilated. Oral contrast reaches the distal ileum. Colon and appendix are within normal limits. Nasogastric tube tip is in the body of the stomach. The stomach is otherwise within normal limits. There is no free air. Vascular/Lymphatic: Aortic atherosclerosis. No enlarged abdominal or pelvic lymph  nodes. Reproductive: Prostate is unremarkable. Other: There is a new peripherally enhancing fluid collection within the pelvis anterior to the rectum measuring 3.0 x 3.1 x 4.2 cm. There is no focal abdominal wall hernia. Midline skin staples are present. Injection sites noted in the anterior abdominal wall. Musculoskeletal: Stable mixed lucencies with coarsened trabeculae in the T12 and L1 vertebral bodies. IMPRESSION: 1. Moderate length segment small bowel in the right abdomen demonstrating wall thickening and mesenteric edema worrisome for nonspecific enteritis including infectious, inflammatory and ischemic etiologies. Small bowel loops proximal to this level are dilated, but oral contrast is seen distal to this level. Findings are compatible with partial small bowel obstruction. 2. New enhancing fluid collection in the pelvis may represent abscess. 3. Nasogastric tube tip in the mid stomach. 4. New bilateral lower lobe atelectasis/airspace disease. 5. These results were called by telephone at the time of interpretation on 03/10/2021 at 8:33 pm to provider NP Chinita Greenland, who verbally acknowledged these results. Electronically Signed   By: Darliss Cheney M.D.   On: 03/10/2021 20:34   [place] EKG SITE RITE  Result Date: 03/11/2021 If Site Rite image not attached, placement could not be confirmed due to current cardiac rhythm.       Scheduled Meds:  insulin aspart  0-20 Units Subcutaneous TID WC   lidocaine  1 patch Transdermal Q24H   metoprolol tartrate  2.5 mg Intravenous Q12H   pantoprazole (PROTONIX) IV  40 mg Intravenous Q12H   Continuous Infusions:  lactated ringers with kcl 75 mL/hr at 03/11/21 0347   methocarbamol (ROBAXIN) IV       LOS: 7 days    Time spent: 35 minutes    Shamiyah Ngu A Amarah Brossman, MD [facility]   If 7PM-7AM, please contact night-coverage [email or web address]  03/11/2021, 7:51 AM

## 2021-03-11 NOTE — Progress Notes (Signed)
Initial Nutrition Assessment  DOCUMENTATION CODES:   Non-severe (moderate) malnutrition in context of acute illness/injury  INTERVENTION:   TPN per Pharmacy to meet 100% estimated nutritional needs  Obtain new weight  NUTRITION DIAGNOSIS:   Moderate Malnutrition related to acute illness (Bowel obstruction) as evidenced by mild fat depletion, mild muscle depletion, energy intake < or equal to 50% for > or equal to 5 days.  GOAL:   Patient will meet greater than or equal to 90% of their needs  MONITOR:   Diet advancement, PO intake, Weight trends, Skin (TPN)  REASON FOR ASSESSMENT:   Consult New TPN/TNA  ASSESSMENT:   65 yo male admitted with SBO. PMH includes HTN, HLD, DM, CAD  10/28 Diag Lap, LOA, mini lap with primary repair of 2 mm SB perforation 11/02 CT scan with possible ileus or partial SBP, small pelvic fluid collection 11/03 TPN initiation  Pt reports eating well PTA 1 week ago. Per weight encounters, pt appears to have lost some weight over the past year; 8% since February 2022. No weight since admission, requesting new weight  Pt is at high nutritional risk given NPO x 7 days. Pt currently ok for ice chips and 1 popsicle only.  NG to LIS, +flatus, no BM  Current wt 94.8 kg, needs new weight  Labs: Creatinine wdl, sodium 134 (L), CBGs 098-119 Meds: ss novolog, Kcl  NUTRITION - FOCUSED PHYSICAL EXAM:  Flowsheet Row Most Recent Value  Orbital Region Mild depletion  Upper Arm Region Mild depletion  Thoracic and Lumbar Region No depletion  Buccal Region Mild depletion  Temple Region Mild depletion  Clavicle Bone Region Mild depletion  Clavicle and Acromion Bone Region Mild depletion  Scapular Bone Region Mild depletion  Dorsal Hand No depletion  Patellar Region Moderate depletion  Anterior Thigh Region Moderate depletion  Posterior Calf Region Severe depletion  Edema (RD Assessment) None       Diet Order:   Diet Order             Diet NPO  time specified Except for: Ice Chips  Diet effective now                   EDUCATION NEEDS:   Not appropriate for education at this time  Skin:  Skin Assessment: Reviewed RN Assessment  Last BM:  10/25  Height:   Ht Readings from Last 1 Encounters:  03/11/21 5\' 7"  (1.702 m)    Weight:   Wt Readings from Last 1 Encounters:  03/11/21 88.8 kg     BMI:  Body mass index is 30.67 kg/m.  Estimated Nutritional Needs:   Kcal:  2100-2300 kcals  Protein:  120-135 g  Fluid:  >/= 2 L  13/03/22 MS, RDN, LDN, CNSC Registered Dietitian III Clinical Nutrition RD Pager and On-Call Pager Number Located in [place]

## 2021-03-11 NOTE — Progress Notes (Signed)
Peripherally Inserted Central Catheter Placement  The IV Nurse has discussed with the patient and/or persons authorized to consent for the patient, the purpose of this procedure and the potential benefits and risks involved with this procedure.  The benefits include less needle sticks, lab draws from the catheter, and the patient may be discharged home with the catheter. Risks include, but not limited to, infection, bleeding, blood clot (thrombus formation), and puncture of an artery; nerve damage and irregular heartbeat and possibility to perform a PICC exchange if needed/ordered by physician.  Alternatives to this procedure were also discussed.  Bard Power PICC patient education guide, fact sheet on infection prevention and patient information card has been provided to patient /or left at bedside.    PICC Placement Documentation  PICC Double Lumen 03/11/21 PICC Right Brachial 41 cm 1 cm (Active)  Indication for Insertion or Continuance of Line Administration of hyperosmolar/irritating solutions (i.e. TPN, Vancomycin, etc.) 03/11/21 1000  Exposed Catheter (cm) 1 cm 03/11/21 1000  Site Assessment Clean;Dry;Intact 03/11/21 1000  Lumen #1 Status Flushed;Saline locked;Blood return noted 03/11/21 1000  Lumen #2 Status Flushed;Saline locked;Blood return noted 03/11/21 1000  Dressing Type Transparent;Securing device 03/11/21 1000  Dressing Status Clean;Intact;Dry 03/11/21 1000  Antimicrobial disc in place? Yes 03/11/21 1000  Safety Lock Not Applicable 03/11/21 1000  Line Care Connections checked and tightened 03/11/21 1000  Dressing Intervention New dressing 03/11/21 1000  Dressing Change Due 03/18/21 03/11/21 1000       Daniel Perkins 03/11/2021, 10:39 AM

## 2021-03-11 NOTE — Progress Notes (Signed)
   03/11/21 1530  Mobility  Activity Refused mobility (Patient reported possible gout flair in LE's causing 10/10 pain)

## 2021-03-11 NOTE — Progress Notes (Signed)
Physical Therapy Treatment Patient Details Name: Daniel Perkins MRN: [id number] DOB: June 29, 1955 Today's Date: 03/11/2021   History of Present Illness 65 y/o M admitted to ED for acute abdominal pain, found to have small bowel obstruction. S/p ex lap, small bowel perforation repair on 10/28. PMH includes CAD/MI, gout, CKD, HTN, DM and ACDF.    PT Comments    Received pt supine in bed, pt reported feeling much better than yesterday, but did c/o pain in abdomen and top of bilateral feet with movement; RN aware and repositioning done to reduce pain levels. Session with focus on functional mobility/transfers, generalized strengthening and activity tolerance, and gait training. Pt required min A to roll, mod A to transition supine<>sitting EOB, and min A to transition sit<>supine during session with cues for logroll technique. Pt able to maintain dynamic sitting balance with close supervision while brushing teeth sitting EOB (cleared with RN). Pt requires min A for transfers with RW and progressed well with ambulation today with cues for upright posture and breathing techniques, however limited by pain/fatigue. Returned to bed to have PICC line placed. Acute PT to cont to follow.     Recommendations for follow up therapy are one component of a multi-disciplinary discharge planning process, led by the attending physician.  Recommendations may be updated based on patient status, additional functional criteria and insurance authorization.  Follow Up Recommendations  Acute inpatient rehab (3hours/day)     Assistance Recommended at Discharge    Equipment Recommendations  Other (comment) (TBD at next venue of care)    Recommendations for Other Services Rehab consult     Precautions / Restrictions Precautions Precautions: Other (comment) Precaution Comments: NG tube, abdominal precautions Restrictions Weight Bearing Restrictions: No Other Position/Activity Restrictions: no lifting >10lbs      Mobility  Bed Mobility Overal bed mobility: Needs Assistance Bed Mobility: Rolling;Sidelying to Sit;Sit to Sidelying Rolling: Min assist Sidelying to sit: Mod assist     Sit to sidelying: Min guard General bed mobility comments: cues for logroll technique    Transfers Overall transfer level: Needs assistance Equipment used: Rolling walker (2 wheels) Transfers: Sit to/from [employer] Sit to Stand: Min assist Stand pivot transfers: Min assist         General transfer comment: cues to reach back for recliner when sitting    Ambulation/Gait Ambulation/Gait assistance: Min guard;+2 safety/equipment Gait Distance (Feet): 115 Feet (80 + 115 - seated rest break in recliner) Assistive device: Rolling walker (2 wheels) Gait Pattern/deviations: Decreased stride length;Trunk flexed;Decreased step length - right;Decreased step length - left;Narrow base of support Gait velocity: decreased Gait velocity interpretation: <1.31 ft/sec, indicative of household ambulator General Gait Details: breathing heavily - required cues for upight posture and breathing techniques   Stairs             Wheelchair Mobility    Modified Rankin (Stroke Patients Only)       Balance Overall balance assessment: Needs assistance Sitting-balance support: Bilateral upper extremity supported;Feet supported Sitting balance-Leahy Scale: Fair Sitting balance - Comments: required assist to don shoes   Standing balance support: Bilateral upper extremity supported (RW) Standing balance-Leahy Scale: Poor Standing balance comment: heavy reliance on BUE support on RW                            Cognition Arousal/Alertness: Awake/alert Behavior During Therapy: [facility] for tasks assessed/performed Overall Cognitive Status: Within Functional Limits for tasks assessed  Exercises      General Comments General comments  (skin integrity, edema, etc.): pt fatigued after ambulating      Pertinent Vitals/Pain Pain Assessment: Faces Pain Score:  (did not report pain level) Faces Pain Scale: Hurts a little bit Pain Location: abdomen and top of bilateral feet Pain Descriptors / Indicators: Aching;Discomfort;Dull;Sore Pain Intervention(s): Monitored during session;Premedicated before session;Repositioned;Utilized relaxation techniques    Home Living                          Prior Function            PT Goals (current goals can now be found in the care plan section) Acute Rehab PT Goals Patient Stated Goal: Get stronger before going home PT Goal Formulation: With patient Time For Goal Achievement: 03/20/21 Potential to Achieve Goals: Good Progress towards PT goals: Progressing toward goals    Frequency    Min 3X/week      PT Plan Current plan remains appropriate    Co-evaluation PT/OT/SLP Co-Evaluation/Treatment: Yes   PT goals addressed during session: Mobility/safety with mobility;Balance;Proper use of DME;Strengthening/ROM        AM-PAC PT "6 Clicks" Mobility   Outcome Measure  Help needed turning from your back to your side while in a flat bed without using bedrails?: A Lot Help needed moving from lying on your back to sitting on the side of a flat bed without using bedrails?: A Lot Help needed moving to and from a bed to a chair (including a wheelchair)?: A Little Help needed standing up from a chair using your arms (e.g., wheelchair or bedside chair)?: A Little Help needed to walk in hospital room?: A Little Help needed climbing 3-5 steps with a railing? : A Lot 6 Click Score: 15    End of Session Equipment Utilized During Treatment: Gait belt Activity Tolerance: Patient tolerated treatment well Patient left: in bed;with call bell/phone within reach (pharmacist and nursing student at bedside) Nurse Communication: Mobility status PT Visit Diagnosis: Unsteadiness on  feet (R26.81);Muscle weakness (generalized) (M62.81);Difficulty in walking, not elsewhere classified (R26.2);Pain Pain - part of body:  (abdomen and top of bilateral feet)     Time: 8101-7510 PT Time Calculation (min) (ACUTE ONLY): 54 min  Charges:  $Gait Training: 8-22 mins (15 min) $Therapeutic Activity: 38-52 mins (39 min)                     Raechel Chute PT, DPT   Alfonso Patten 03/11/2021, 10:24 AM

## 2021-03-11 NOTE — Progress Notes (Signed)
Patient ID: Finch Costanzo, male   DOB: [birth year], 65 y.o.   MRN: [id number] [facility] Surgery Progress Note  6 Days Post-Op  Subjective: CC-  Overall feeling better. He reports less abdominal pain today and he started passing a lot of flatus over night. No BM.  Objective: Vital signs in last 24 hours: Temp:  [97.6 F (36.4 C)-99.5 F (37.5 C)] 98.5 F (36.9 C) (11/03 0822) Pulse Rate:  [81-86] 86 (11/03 0822) Resp:  [16-20] 16 (11/03 0822) BP: (118-170)/(75-94) 163/86 (11/03 0822) SpO2:  [92 %-97 %] 96 % (11/03 0822) Last BM Date: 03/02/21  Intake/Output from previous day: 11/02 0701 - 11/03 0700 In: 1806.6 [P.O.:250; I.V.:1123; IV Piggyback:433.7] Out: 3375 [Urine:1700; Emesis/NG output:1675] Intake/Output this shift: No intake/output data recorded.  PE: Gen:  Alert, NAD, pleasant HEENT: NG tube with light green bilious drainage Card:  RRR Pulm:  normal effort on room air Abd: soft, mild distension, + BS, lap incisions and midline cdi with staples present and no erythema or drainage  Lab Results:  Recent Labs    03/10/21 0149 03/11/21 0045  WBC 13.5* 12.3*  HGB 12.2* 11.6*  HCT 37.3* 35.8*  PLT 367 396   BMET Recent Labs    03/10/21 0149 03/11/21 0045  NA 133* 134*  K 3.6 3.7  CL 104 104  CO2 19* 22  GLUCOSE 143* 119*  BUN 15 15  CREATININE 0.97 1.01  CALCIUM 8.8* 8.6*   PT/INR No results for input(s): LABPROT, INR in the last 72 hours. CMP     Component Value Date/Time   NA 134 (L) 03/11/2021 0045   NA 137 02/10/2021 1538   K 3.7 03/11/2021 0045   CL 104 03/11/2021 0045   CO2 22 03/11/2021 0045   GLUCOSE 119 (H) 03/11/2021 0045   BUN 15 03/11/2021 0045   BUN 13 02/10/2021 1538   CREATININE 1.01 03/11/2021 0045   CREATININE 1.22 06/30/2014 1634   CALCIUM 8.6 (L) 03/11/2021 0045   PROT 8.0 03/04/2021 0313   PROT 7.2 12/06/2018 1648   ALBUMIN 3.5 03/04/2021 0313   ALBUMIN 4.2 12/06/2018 1648   AST 20 03/04/2021 0313   ALT 14  03/04/2021 0313   ALKPHOS 93 03/04/2021 0313   BILITOT 1.0 03/04/2021 0313   BILITOT 0.3 12/06/2018 1648   GFRNONAA >60 03/11/2021 0045   GFRNONAA 78 10/23/2013 1554   GFRAA 86 06/09/2020 1508   GFRAA >89 10/23/2013 1554   Lipase  No results found for: LIPASE     Studies/Results: DG Abd 1 View  Result Date: 03/10/2021 CLINICAL DATA:  Feeding tube placement EXAM: ABDOMEN - 1 VIEW COMPARISON:  03/05/2021 FINDINGS: Large-bore esophagogastric tube is positioned with tip below the diaphragm, in the gastric fundus, and side port near the level of the gastroesophageal junction. Recommend advancement to ensure subdiaphragmatic positioning. There is no enteric feeding tube identified in the lower chest or abdomen per reported exam indication. Gas-filled, nondistended loops of small bowel and colon in the included upper abdomen. IMPRESSION: 1. Large-bore esophagogastric tube is positioned with tip below the diaphragm, in the gastric fundus, and side port near the level of the gastroesophageal junction. Recommend advancement to ensure subdiaphragmatic positioning of tip and side port. 2. No enteric feeding tube identified in the lower chest or abdomen per reported exam indication. Electronically Signed   By: Jearld Lesch M.D.   On: 03/10/2021 12:20   CT ABDOMEN PELVIS W CONTRAST  Result Date: 03/10/2021 CLINICAL DATA:  Abdominal distension with increased  pain. Five days status post exploratory laparotomy and small bowel perforation. EXAM: CT ABDOMEN AND PELVIS WITH CONTRAST TECHNIQUE: Multidetector CT imaging of the abdomen and pelvis was performed using the standard protocol following bolus administration of intravenous contrast. CONTRAST:  OMNIPAQUE IOHEXOL 300 MG/ML  SOLN COMPARISON:  CT abdomen and pelvis 03/04/2021. FINDINGS: Lower chest: There are new bands of atelectasis/airspace disease in the bilateral lower lobes. Mild emphysematous changes are present. Hepatobiliary: Hyperdensity in the  gallbladder is new from prior, likely excreted contrast. No radiopaque gallstones or bile duct dilatation. No focal liver lesions are identified. Pancreas: Unremarkable. No pancreatic ductal dilatation or surrounding inflammatory changes. Spleen: Normal in size without focal abnormality. Adrenals/Urinary Tract: Adrenal glands are unremarkable. Kidneys are normal, without renal calculi, focal lesion, or hydronephrosis. Bladder is unremarkable. Stomach/Bowel: There is a moderate length segment of small bowel in the right abdomen demonstrating circumferential wall thickening/edema with mesenteric edema. There is no pneumatosis. Small bowel loops proximal to this level are dilated measuring up to 3.8 cm. Small bowel loops distal to this level are nondilated. Oral contrast reaches the distal ileum. Colon and appendix are within normal limits. Nasogastric tube tip is in the body of the stomach. The stomach is otherwise within normal limits. There is no free air. Vascular/Lymphatic: Aortic atherosclerosis. No enlarged abdominal or pelvic lymph nodes. Reproductive: Prostate is unremarkable. Other: There is a new peripherally enhancing fluid collection within the pelvis anterior to the rectum measuring 3.0 x 3.1 x 4.2 cm. There is no focal abdominal wall hernia. Midline skin staples are present. Injection sites noted in the anterior abdominal wall. Musculoskeletal: Stable mixed lucencies with coarsened trabeculae in the T12 and L1 vertebral bodies. IMPRESSION: 1. Moderate length segment small bowel in the right abdomen demonstrating wall thickening and mesenteric edema worrisome for nonspecific enteritis including infectious, inflammatory and ischemic etiologies. Small bowel loops proximal to this level are dilated, but oral contrast is seen distal to this level. Findings are compatible with partial small bowel obstruction. 2. New enhancing fluid collection in the pelvis may represent abscess. 3. Nasogastric tube tip in the  mid stomach. 4. New bilateral lower lobe atelectasis/airspace disease. 5. These results were called by telephone at the time of interpretation on 03/10/2021 at 8:33 pm to provider NP Chinita Greenland, who verbally acknowledged these results. Electronically Signed   By: Darliss Cheney M.D.   On: 03/10/2021 20:34   [place] EKG SITE RITE  Result Date: 03/11/2021 If Site Rite image not attached, placement could not be confirmed due to current cardiac rhythm.   Anti-infectives: Anti-infectives (From admission, onward)    Start     Dose/Rate Route Frequency Ordered Stop   03/11/21 0900  piperacillin-tazobactam (ZOSYN) IVPB 3.375 g        3.375 g 12.5 mL/hr over 240 Minutes Intravenous Every 8 hours 03/11/21 0815     03/05/21 0845  cefoTEtan (CEFOTAN) 2 g in sodium chloride 0.9 % 100 mL IVPB        2 g 200 mL/hr over 30 Minutes Intravenous To ShortStay Surgical 03/04/21 2206 03/05/21 1035        Assessment/Plan SBO with no previous abdominal surgical history  POD#6 S/P diagnostic laparoscopy, lysis of single adhesive band in the left lower quadrant with resolution of bowel obstruction, mini laparotomy with primary repair of 2 mm small bowel perforation on 03/05/21 Dr. Fredricka Bonine - CT scan yesterday with some nonspecific thickened and dilated small bowel, possible ileus or partial SBO; small pelvic fluid collection -  Fluid collection on CT is small, would not recommend draining. Will restart zosyn - Continue NPO/NGT to LIWS today. Ok for ice chips and 1 popsicle - Mobilize. Working with PT/OT who are currently recommending CIR   ID - cefotetan periop 10/28, zosyn 11/3>> VTE - SCDs, sq heparin FEN - IVF, NGT to LIWS, TPN Foley - none   DM HTN CAD h/o MI several years ago - followed by Dr. Eldridge Dace PAD HLD Chronic neck pain   LOS: 7 days    Franne Forts, [facility] Surgery 03/11/2021, 9:30 AM Please see Amion for pager number during day hours 7:00am-4:30pm

## 2021-03-11 NOTE — Progress Notes (Signed)
    OVERNIGHT PROGRESS REPORT   Spoke with Dr Dennard Schaumann M.D. about the results of CT of earlier with no alarming findings.  Nursing has no new pain and pain confined to surgical area and being controlled with intermittent medications.  In no obvious or stated distress with no apparent changes from earlier.   Daniel Perkins MSNA MSN ACNPC-AG Acute Care Nurse Practitioner [facility]

## 2021-03-11 NOTE — Progress Notes (Signed)
PHARMACY - TOTAL PARENTERAL NUTRITION CONSULT NOTE  Indication: Prolonged ileus  Patient Measurements: Height: 5\' 7"  (170.2 cm) Weight: 94.8 kg (209 lb) IBW/kg (Calculated) : 66.1 TPN AdjBW (KG): 73.3 Body mass index is 32.73 kg/m.  Assessment:  67 YOM presented on 10/27 with acute abdominal pain, nausea and vomiting after dinner.  CT showed SBO and patient underwent ex-lap with lysis of single LLQ adhesion and repair of small bowel perforation on 10/28.  NG tube clamped and clears started 11/1.  Subsequently had nausea and lower abdominal pain, so NG tube switched back to LIWS.  Repeat CT on 11/2 showed nonspecific thickened and dilated small bowel, possible ileus or pSBO; small pelvic fluid collection.  Patient has inadequate nutrition for 7 days, so Pharmacy consulted for TPN management.  Patient reports he typically eats 3 meals a day.  He loves steak and chicken; eats canned vegetables and fruits.  He weighs 237 lbs earlier in the year, lost ~30 lbs over the summer.  Glucose / Insulin: DM on glipizide, Victoza and Jardiance PTA, A1c 6%.   CBGs well controlled - used 3 units SSI in past 24 hrs Electrolytes: low Na, K 3.7 (goal >/= 4), others WNL Renal: SCr 1.01, BUN WNL Hepatic: LFTs / tbili WNL on admit Intake / Output; MIVF: UOP 0.7 ml/kg/hr, NG 03-04-1976, LBM PTA 10/26.  LR 10K at 75 ml/hr, net even I/O's GI Imaging: none since TPN GI Surgeries / Procedures: none since TPN  Central access: PICC to be placed 03/11/21 TPN start date: 03/11/21  Nutritional Goals: RD assessment pending:   2000-2200, 110-125g protein per day  Current Nutrition:  NPO  Plan:  Start TPN at 40 mL/hr at 1800 (goal rate 90 ml/hr) Electrolytes in TPN: Na 166mEq/L, K 63mEq/L, Ca 25mEq/L, Mg 21mEq/L, Phos 22mmol/L. Cl:Ac 1:1 Add standard MVI and trace elements to TPN Change resistant SSI to Q4H.  Empirically add 10 units regular insulin to TPN. Change IVF to NS and reduce to 35 ml/hr when TPN starts KCL x  4 runs Standard TPN labs and nursing care orders  Zosyn EID 3.375gm IV Q8H Pharmacy will sign off and follow peripherally.  Thank you for the consult!  Kitti Mcclish D. 12m, PharmD, BCPS, BCCCP 03/11/2021, 10:27 AM

## 2021-03-11 NOTE — Progress Notes (Signed)
OT Cancellation Note  Patient Details Name: Michiah Mudry MRN: [id number] DOB: 1955-12-09   Cancelled Treatment:    Reason Eval/Treat Not Completed: Other (comment) (Pt worked with PT this morning, just got back to bed, agreeable to therapy later this afternoon. Will return as schedule allows.)  Alfonzo Beers, OTD, OTR/L Acute Rehab [phone]   Mayer Masker 03/11/2021, 10:56 AM

## 2021-03-11 NOTE — Progress Notes (Signed)
Occupational Therapy Treatment Patient Details Name: Daniel Perkins MRN: [id number] DOB: 1955-05-27 Today's Date: 03/11/2021   History of present illness 65 y/o M admitted to ED for acute abdominal pain, found to have small bowel obstruction. S/p ex lap, small bowel perforation repair on 10/28. PMH includes CAD/MI, gout, CKD, HTN, DM and ACDF.   OT comments  Upon arrival, pt supine in bed, reports recently attempted to stand with nursing staff and difficulty due to bilateral foot pain on tops of feet. Despite pain, pt motivated and agreeable to try OOB activity with therapy. Pt mod-max A for LE dressing and took a few lateral steps with RW, however continues to be limited by foot pain. Will continue to follow for acute OT needs. Pt motivated and eager to recover, still remains good candidate for CIR.     Recommendations for follow up therapy are one component of a multi-disciplinary discharge planning process, led by the attending physician.  Recommendations may be updated based on patient status, additional functional criteria and insurance authorization.    Follow Up Recommendations  Acute inpatient rehab (3hours/day)    Assistance Recommended at Discharge Intermittent Supervision/Assistance  Equipment Recommendations  None recommended by OT    Recommendations for Other Services PT consult    Precautions / Restrictions Precautions Precautions: Other (comment) (abdominal precautions) Restrictions Weight Bearing Restrictions: No Other Position/Activity Restrictions: no lifting >10lbs       Mobility Bed Mobility Overal bed mobility: Needs Assistance Bed Mobility: Supine to Sit;Sit to Supine     Supine to sit: Min guard Sit to supine: Min guard   General bed mobility comments: utilized log rolling method    Transfers Overall transfer level: Needs assistance Equipment used: Rolling walker (2 wheels) Transfers: Sit to/from Stand Sit to Stand: Min guard            General transfer comment: took 3-4 R side steps toward [facility] with RW     Balance Overall balance assessment: Needs assistance Sitting-balance support: Bilateral upper extremity supported       Standing balance support: Bilateral upper extremity supported                               ADL either performed or assessed with clinical judgement   ADL Overall ADL's : Needs assistance/impaired                     Lower Body Dressing: Sitting/lateral leans;Moderate assistance;Maximal assistance Lower Body Dressing Details (indicate cue type and reason): pt required mod-max A to don shoes while seated EOB, suspect mostly d/t bilateral foot pain. Pt able to help push heel into shoes. Toilet Transfer: Min guard;Cueing for Chief of Staff Details (indicate cue type and reason): simulated toilet transfer using RW           General ADL Comments: Presenting with decreased activity tolerance due to fatigue and pain. Very motivated. Pt very concerned about foot pain, and notes possible redness on tops of feet     Vision   Vision Assessment?: No apparent visual deficits   Perception Perception Perception: Not tested   Praxis Praxis Praxis: Not tested    Cognition Arousal/Alertness: Awake/alert Behavior During Therapy: WFL for tasks assessed/performed Overall Cognitive Status: Within Functional Limits for tasks assessed  Exercises     Shoulder Instructions       General Comments pt constantly grimacing during bed mobility/transfer/standing/walking d/t foot pain. very anxious/concerned about foot pain. Discussed possible gout with RN/MD as pt reports he has gout flares.     Pertinent Vitals/ Pain       Pain Assessment: Faces Pain Score: 6  Faces Pain Scale: Hurts even more Pain Location: abdomen and top of bilateral feet Pain Descriptors / Indicators: Aching;Discomfort;Dull;Sore Pain  Intervention(s): Limited activity within patient's tolerance;Relaxation;Monitored during session;Repositioned  Home Living                                          Prior Functioning/Environment              Frequency  Min 2X/week        Progress Toward Goals  OT Goals(current goals can now be found in the care plan section)  Progress towards OT goals: Progressing toward goals (pt limited by pain at this time)  Acute Rehab OT Goals Patient Stated Goal: "feel better" OT Goal Formulation: With patient Time For Goal Achievement: 03/23/21 Potential to Achieve Goals: Good ADL Goals Pt Will Perform Upper Body Dressing: sitting;with supervision Pt Will Perform Lower Body Dressing: sit to/from stand;with min guard assist Pt Will Transfer to Toilet: regular height toilet;with min guard assist Pt Will Perform Tub/Shower Transfer: with min assist  Plan Discharge plan remains appropriate    Co-evaluation                 AM-PAC OT "6 Clicks" Daily Activity     Outcome Measure   Help from another person eating meals?: A Little Help from another person taking care of personal grooming?: A Little Help from another person toileting, which includes using toliet, bedpan, or urinal?: A Little Help from another person bathing (including washing, rinsing, drying)?: A Lot Help from another person to put on and taking off regular upper body clothing?: A Little Help from another person to put on and taking off regular lower body clothing?: A Lot 6 Click Score: 16    End of Session Equipment Utilized During Treatment: Gait belt;Rolling walker (2 wheels)  OT Visit Diagnosis: Unsteadiness on feet (R26.81);Other abnormalities of gait and mobility (R26.89);Muscle weakness (generalized) (M62.81);Pain   Activity Tolerance     Patient Left in bed;with call bell/phone within reach;with bed alarm set   Nurse Communication Other (comment);Mobility status (pt complaints  of redness/pain in bilateral tops of feet)        Time: 5102-5852 OT Time Calculation (min): 16 min  Charges: OT General Charges $OT Visit: 1 Visit OT Treatments $Self Care/Home Management : 8-22 mins  Alfonzo Beers, OTD, OTR/L Acute Rehab [phone]   Mayer Masker 03/11/2021, 3:39 PM

## 2021-03-12 DIAGNOSIS — K56609 Unspecified intestinal obstruction, unspecified as to partial versus complete obstruction: Secondary | ICD-10-CM | POA: Diagnosis not present

## 2021-03-12 LAB — CBC WITH DIFFERENTIAL/PLATELET
Abs Immature Granulocytes: 0.1 10*3/uL — ABNORMAL HIGH (ref 0.00–0.07)
Basophils Absolute: 0 10*3/uL (ref 0.0–0.1)
Basophils Relative: 0 %
Eosinophils Absolute: 0.4 10*3/uL (ref 0.0–0.5)
Eosinophils Relative: 3 %
HCT: 34.6 % — ABNORMAL LOW (ref 39.0–52.0)
Hemoglobin: 11.1 g/dL — ABNORMAL LOW (ref 13.0–17.0)
Lymphocytes Relative: 21 %
Lymphs Abs: 2.9 10*3/uL (ref 0.7–4.0)
MCH: 22.2 pg — ABNORMAL LOW (ref 26.0–34.0)
MCHC: 32.1 g/dL (ref 30.0–36.0)
MCV: 69.2 fL — ABNORMAL LOW (ref 80.0–100.0)
Monocytes Absolute: 1.5 10*3/uL — ABNORMAL HIGH (ref 0.1–1.0)
Monocytes Relative: 11 %
Neutro Abs: 8.9 10*3/uL — ABNORMAL HIGH (ref 1.7–7.7)
Neutrophils Relative %: 64 %
Platelets: 391 10*3/uL (ref 150–400)
Promyelocytes Relative: 1 %
RBC: 5 MIL/uL (ref 4.22–5.81)
RDW: 14.4 % (ref 11.5–15.5)
Smear Review: ADEQUATE
WBC: 13.9 10*3/uL — ABNORMAL HIGH (ref 4.0–10.5)
nRBC: 0 % (ref 0.0–0.2)

## 2021-03-12 LAB — COMPREHENSIVE METABOLIC PANEL
ALT: 20 U/L (ref 0–44)
AST: 22 U/L (ref 15–41)
Albumin: 2 g/dL — ABNORMAL LOW (ref 3.5–5.0)
Alkaline Phosphatase: 78 U/L (ref 38–126)
Anion gap: 7 (ref 5–15)
BUN: 13 mg/dL (ref 8–23)
CO2: 25 mmol/L (ref 22–32)
Calcium: 8.6 mg/dL — ABNORMAL LOW (ref 8.9–10.3)
Chloride: 104 mmol/L (ref 98–111)
Creatinine, Ser: 0.98 mg/dL (ref 0.61–1.24)
GFR, Estimated: >60 mL/min (ref >60)
Glucose, Bld: 126 mg/dL — ABNORMAL HIGH (ref 70–99)
Potassium: 3.7 mmol/L (ref 3.5–5.1)
Sodium: 136 mmol/L (ref 135–145)
Total Bilirubin: 1 mg/dL (ref 0.3–1.2)
Total Protein: 6.8 g/dL (ref 6.5–8.1)

## 2021-03-12 LAB — PHOSPHORUS: Phosphorus: 3.9 mg/dL (ref 2.5–4.6)

## 2021-03-12 LAB — GLUCOSE, CAPILLARY
Glucose-Capillary: 135 mg/dL — ABNORMAL HIGH (ref 70–99)
Glucose-Capillary: 145 mg/dL — ABNORMAL HIGH (ref 70–99)
Glucose-Capillary: 146 mg/dL — ABNORMAL HIGH (ref 70–99)
Glucose-Capillary: 149 mg/dL — ABNORMAL HIGH (ref 70–99)
Glucose-Capillary: 159 mg/dL — ABNORMAL HIGH (ref 70–99)
Glucose-Capillary: 181 mg/dL — ABNORMAL HIGH (ref 70–99)

## 2021-03-12 LAB — MAGNESIUM: Magnesium: 2.2 mg/dL (ref 1.7–2.4)

## 2021-03-12 LAB — TRIGLYCERIDES: Triglycerides: 138 mg/dL (ref <150)

## 2021-03-12 MED ORDER — HEPARIN SODIUM (PORCINE) 5000 UNIT/ML IJ SOLN
5000.0000 [IU] | Freq: Three times a day (TID) | INTRAMUSCULAR | Status: DC
  Administered 2021-03-12 – 2021-03-25 (×39): 5000 [IU] via SUBCUTANEOUS
  Filled 2021-03-12 (×39): qty 1

## 2021-03-12 MED ORDER — TRAVASOL 10 % IV SOLN
INTRAVENOUS | Status: AC
  Filled 2021-03-12: qty 1252.8

## 2021-03-12 MED ORDER — POTASSIUM CHLORIDE 10 MEQ/50ML IV SOLN
10.0000 meq | INTRAVENOUS | Status: AC
  Administered 2021-03-12 (×4): 10 meq via INTRAVENOUS
  Filled 2021-03-12 (×4): qty 50

## 2021-03-12 MED ORDER — LIP MEDEX EX OINT
TOPICAL_OINTMENT | CUTANEOUS | Status: DC | PRN
  Filled 2021-03-12: qty 7

## 2021-03-12 NOTE — Progress Notes (Signed)
PHARMACY - TOTAL PARENTERAL NUTRITION CONSULT NOTE  Indication: Prolonged ileus  Patient Measurements: Height: 5\' 7"  (170.2 cm) Weight: 88.8 kg (195 lb 12.8 oz) IBW/kg (Calculated) : 66.1 TPN AdjBW (KG): 71.8 Body mass index is 30.67 kg/m.  Assessment:  55 YOM presented on 10/27 with acute abdominal pain, nausea and vomiting after dinner.  CT showed SBO and patient underwent ex-lap with lysis of single LLQ adhesion and repair of small bowel perforation on 10/28.  NG tube clamped and clears started 11/1.  Subsequently had nausea and lower abdominal pain, so NG tube switched back to LIWS.  Repeat CT on 11/2 showed nonspecific thickened and dilated small bowel, possible ileus or pSBO; small pelvic fluid collection.  Patient has inadequate nutrition for 7 days, so Pharmacy consulted for TPN management.  Patient reports he typically eats 3 meals a day.  He loves steak and chicken; eats canned vegetables and fruits.  He weighs 237 lbs earlier in the year, lost ~30 lbs over the summer.  Glucose / Insulin: DM on glipizide, Victoza and Jardiance PTA, A1c 6%.   CBGs well controlled - used 9 units SSI in past 24 hrs Electrolytes: Na 136, K 3.7 (goal >/= 4), others WNL Renal: SCr 0.98, BUN WNL - UOP way down monitor closely Hepatic: LFTs / tbili WNL; Tgs 138  Intake / Output; MIVF: UOP 0.1 ml/kg/hr, NG 03-04-1976, LBM PTA 10/26.  NS at 35 ml/hrGI Imaging: none since TPN GI Surgeries / Procedures: none since TPN  Central access: PICC placed 03/11/21 TPN start date: 03/11/21  Nutritional Goals: RD assessment 11/3: Estimated Needs Total Energy Estimated Needs: 2100-2300 kcals Total Protein Estimated Needs: 120-135 g Total Fluid Estimated Needs: >/= 2 L  Current Nutrition:  NPO  Plan:  Increase TPN to 90 mL/hr at 1800 (goal rate) Electrolytes in TPN: Na 169mEq/L, K 75mEq/L, Ca 67mEq/L, Mg 65mEq/L, Phos 73mmol/L. Cl:Ac 1:1 Add standard MVI and trace elements to TPN Change resistant SSI to Q4H.   Empirically increase to 15 units regular insulin to TPN. D/C IVF when new TPN bag hung KCL x 4 runs Standard TPN labs and nursing care orders - will obtain BMET Mag and Phos for Saturday am  Wednesday, PharmD, [facility] Clinical Pharmacist Please see AMION for all Pharmacists' Contact Phone Numbers 03/12/2021, 7:15 AM

## 2021-03-12 NOTE — Progress Notes (Signed)
PROGRESS NOTE    Daniel Perkins  JEH:[id number] DOB: [birth year] DOA: 03/04/2021 PCP: Ellyn Hack, MD   Brief Narrative: 65 year old with past medical history significant for hypertension, diabetes type 2, CAD, peripheral artery disease, hyperlipidemia, presented with acute onset of abdominal pain and found to have a small bowel obstruction with transition zone on the left side.  Admitted with surgical consultation.  Seen by cardiology for preop clearance. He underwent ex lap, small bowel perforation repair on 10/28.  Post operative, patient with persistent ileus.  Plan to continue with NG tube. CT 11/02 showed ileus, fluid collection. Patient started on IV antibiotics. TPN>    Assessment & Plan:   Active Problems:   Diabetes mellitus due to underlying condition, uncontrolled, with hyperglycemia (HCC)   Hyperlipemia   HYPERTENSION   Gout   SBO (small bowel obstruction) (HCC)   Malnutrition of moderate degree  1-SBO due to adhesion of the small bowel and perforation. -Postoperative persistent ileus, fluid collection.  -Continue with NG tube.  -Started on TPN 11/03. -CT abdomen showed: Moderate length segment small bowel in the right abdomen demonstrating wall thickening and mesenteric edema worrisome for nonspecific enteritis including infectious, inflammatory and ischemic etiologies. Small bowel loops proximal to this level are dilated, but oral contrast is seen distal to this level. Findings are compatible with partial small bowel obstruction. New enhancing fluid collection in the pelvis may represent abscess. -Continue with  IV zosyn.  -750 out put from NGT   2-Diabetes type 2: Continue to hold Victoza and Jardiance at home. Continue with a sliding scale insulin.  3-HTN;  Currently NOP, holding Oral meds.  He was on carvedilol at home, now on  IV metoprolol schedule.  PRN hydralazine PRN.   4-History of CAD: Holding aspirin. Hyperlipidemia: Holding statins resume  when able to take oral AKI: Improved with IV fluids. Feet pain;  uric acid normal. Continue with  voltaren gel.    Estimated body mass index is 30.67 kg/m as calculated from the following:   Height as of this encounter: 5\' 7"  (1.702 m).   Weight as of this encounter: 88.8 kg.   DVT prophylaxis: start heparin  Code Status: Full code Family Communication: Discussed with patient Disposition Plan:  Status is: Inpatient  Remains inpatient appropriate because: Persistent ileus post op        Consultants:  General surgery  Procedures:  Diagnostic laparoscopy, lysis of single addition band in the left lower quadrant breast solution of with obstruction, mini laparotomy with primary repair of 2 mm small bowel perforation on 10/28 by Dr. 11/28  Antimicrobials:  None  Subjective: He report he is not passing gas. Denies worsening abdominal pain   Objective: Vitals:   03/11/21 2051 03/12/21 0444 03/12/21 1015 03/12/21 1433  BP: 116/75 (!) 145/80 (!) 148/83 120/81  Pulse: 78 82 77 80  Resp:   18   Temp: 97.6 F (36.4 C) 98.7 F (37.1 C) 98.5 F (36.9 C) 98.1 F (36.7 C)  TempSrc: Oral Oral Oral Oral  SpO2: 93% 92% 95% 98%  Weight:      Height:        Intake/Output Summary (Last 24 hours) at 03/12/2021 1539 Last data filed at 03/12/2021 1328 Gross per 24 hour  Intake 1582.48 ml  Output 850 ml  Net 732.48 ml    Filed Weights   03/04/21 0256 03/11/21 1400  Weight: 94.8 kg 88.8 kg    Examination:  General exam: NAD Respiratory system: CTA Cardiovascular system:  S 1, S  2 RRR Gastrointestinal system: BS decreased, soft, distend, no tender.  incision cover  Central nervous system: Alert, follows command Extremities: no edema     Data Reviewed: I have personally reviewed following labs and imaging studies  CBC: Recent Labs  Lab 03/08/21 0055 03/09/21 0820 03/10/21 0149 03/11/21 0045 03/12/21 0340  WBC 12.7* 14.2* 13.5* 12.3* 13.9*  NEUTROABS 9.0*  --   9.6* 7.7 8.9*  HGB 11.7* 12.6* 12.2* 11.6* 11.1*  HCT 37.8* 39.3 37.3* 35.8* 34.6*  MCV 72.7* 69.2* 68.3* 69.0* 69.2*  PLT 287 369 367 396 391    Basic Metabolic Panel: Recent Labs  Lab 03/08/21 0055 03/09/21 0155 03/10/21 0149 03/11/21 0045 03/12/21 0340  NA 133* 134* 133* 134* 136  K 3.9 3.7 3.6 3.7 3.7  CL 102 105 104 104 104  CO2 22 20* 19* 22 25  GLUCOSE 118* 119* 143* 119* 126*  BUN 16 15 15 15 13   CREATININE 1.14 1.04 0.97 1.01 0.98  CALCIUM 8.9 8.8* 8.8* 8.6* 8.6*  MG 2.5*  --   --  2.2 2.2  PHOS 3.0  --   --  3.8 3.9    GFR: Estimated Creatinine Clearance: 81 mL/min (by C-G formula based on SCr of 0.98 mg/dL). Liver Function Tests: Recent Labs  Lab 03/12/21 0340  AST 22  ALT 20  ALKPHOS 78  BILITOT 1.0  PROT 6.8  ALBUMIN 2.0*    No results for input(s): LIPASE, AMYLASE in the last 168 hours. No results for input(s): AMMONIA in the last 168 hours. Coagulation Profile: No results for input(s): INR, PROTIME in the last 168 hours. Cardiac Enzymes: No results for input(s): CKTOTAL, CKMB, CKMBINDEX, TROPONINI in the last 168 hours. BNP (last 3 results) No results for input(s): PROBNP in the last 8760 hours. HbA1C: No results for input(s): HGBA1C in the last 72 hours. CBG: Recent Labs  Lab 03/11/21 2020 03/11/21 2333 03/12/21 0441 03/12/21 0746 03/12/21 1257  GLUCAP 129* 121* 135* 146* 159*    Lipid Profile: Recent Labs    03/12/21 0340  TRIG 138   Thyroid Function Tests: No results for input(s): TSH, T4TOTAL, FREET4, T3FREE, THYROIDAB in the last 72 hours. Anemia Panel: No results for input(s): VITAMINB12, FOLATE, FERRITIN, TIBC, IRON, RETICCTPCT in the last 72 hours. Sepsis Labs: No results for input(s): PROCALCITON, LATICACIDVEN in the last 168 hours.   Recent Results (from the past 240 hour(s))  Resp Panel by RT-PCR (Flu A&B, Covid) Nasopharyngeal Swab     Status: None   Collection Time: 03/04/21  7:44 AM   Specimen: Nasopharyngeal  Swab; Nasopharyngeal(NP) swabs in vial transport medium  Result Value Ref Range Status   SARS Coronavirus 2 by RT PCR NEGATIVE NEGATIVE Final    Comment: (NOTE) SARS-CoV-2 target nucleic acids are NOT DETECTED.  The SARS-CoV-2 RNA is generally detectable in upper respiratory specimens during the acute phase of infection. The lowest concentration of SARS-CoV-2 viral copies this assay can detect is 138 copies/mL. A negative result does not preclude SARS-Cov-2 infection and should not be used as the sole basis for treatment or other patient management decisions. A negative result may occur with  improper specimen collection/handling, submission of specimen other than nasopharyngeal swab, presence of viral mutation(s) within the areas targeted by this assay, and inadequate number of viral copies(<138 copies/mL). A negative result must be combined with clinical observations, patient history, and epidemiological information. The expected result is Negative.  Fact Sheet for Patients:  BloggerCourse.com  Fact Sheet for Healthcare Providers:  SeriousBroker.it  This test is no t yet approved or cleared by the [place] FDA and  has been authorized for detection and/or diagnosis of SARS-CoV-2 by FDA under an Emergency Use Authorization (EUA). This EUA will remain  in effect (meaning this test can be used) for the duration of the COVID-19 declaration under Section 564(b)(1) of the Act, 21 U.S.C.section 360bbb-3(b)(1), unless the authorization is terminated  or revoked sooner.       Influenza A by PCR NEGATIVE NEGATIVE Final   Influenza B by PCR NEGATIVE NEGATIVE Final    Comment: (NOTE) The Xpert Xpress SARS-CoV-2/FLU/RSV plus assay is intended as an aid in the diagnosis of influenza from Nasopharyngeal swab specimens and should not be used as a sole basis for treatment. Nasal washings and aspirates are unacceptable for Xpert Xpress  SARS-CoV-2/FLU/RSV testing.  Fact Sheet for Patients: BloggerCourse.com  Fact Sheet for Healthcare Providers: SeriousBroker.it  This test is not yet approved or cleared by the [place] FDA and has been authorized for detection and/or diagnosis of SARS-CoV-2 by FDA under an Emergency Use Authorization (EUA). This EUA will remain in effect (meaning this test can be used) for the duration of the COVID-19 declaration under Section 564(b)(1) of the Act, 21 U.S.C. section 360bbb-3(b)(1), unless the authorization is terminated or revoked.  Performed at [facility], [address], [place], [place] [address]   Surgical pcr screen     Status: None   Collection Time: 03/05/21  2:58 AM   Specimen: Nasal Mucosa; Nasal Swab  Result Value Ref Range Status   MRSA, PCR NEGATIVE NEGATIVE Final   Staphylococcus aureus NEGATIVE NEGATIVE Final    Comment: (NOTE) The Xpert SA Assay (FDA approved for NASAL specimens in patients 60 years of age and older), is one component of a comprehensive surveillance program. It is not intended to diagnose infection nor to guide or monitor treatment. Performed at [facility], [address], [place], [place] [address]           Radiology Studies: CT ABDOMEN PELVIS W CONTRAST  Result Date: 03/10/2021 CLINICAL DATA:  Abdominal distension with increased pain. Five days status post exploratory laparotomy and small bowel perforation. EXAM: CT ABDOMEN AND PELVIS WITH CONTRAST TECHNIQUE: Multidetector CT imaging of the abdomen and pelvis was performed using the standard protocol following bolus administration of intravenous contrast. CONTRAST:  OMNIPAQUE IOHEXOL 300 MG/ML  SOLN COMPARISON:  CT abdomen and pelvis 03/04/2021. FINDINGS: Lower chest: There are new bands of atelectasis/airspace disease in the bilateral lower lobes. Mild emphysematous changes are present. Hepatobiliary:  Hyperdensity in the gallbladder is new from prior, likely excreted contrast. No radiopaque gallstones or bile duct dilatation. No focal liver lesions are identified. Pancreas: Unremarkable. No pancreatic ductal dilatation or surrounding inflammatory changes. Spleen: Normal in size without focal abnormality. Adrenals/Urinary Tract: Adrenal glands are unremarkable. Kidneys are normal, without renal calculi, focal lesion, or hydronephrosis. Bladder is unremarkable. Stomach/Bowel: There is a moderate length segment of small bowel in the right abdomen demonstrating circumferential wall thickening/edema with mesenteric edema. There is no pneumatosis. Small bowel loops proximal to this level are dilated measuring up to 3.8 cm. Small bowel loops distal to this level are nondilated. Oral contrast reaches the distal ileum. Colon and appendix are within normal limits. Nasogastric tube tip is in the body of the stomach. The stomach is otherwise within normal limits. There is no free air. Vascular/Lymphatic: Aortic atherosclerosis. No enlarged abdominal  or pelvic lymph nodes. Reproductive: Prostate is unremarkable. Other: There is a new peripherally enhancing fluid collection within the pelvis anterior to the rectum measuring 3.0 x 3.1 x 4.2 cm. There is no focal abdominal wall hernia. Midline skin staples are present. Injection sites noted in the anterior abdominal wall. Musculoskeletal: Stable mixed lucencies with coarsened trabeculae in the T12 and L1 vertebral bodies. IMPRESSION: 1. Moderate length segment small bowel in the right abdomen demonstrating wall thickening and mesenteric edema worrisome for nonspecific enteritis including infectious, inflammatory and ischemic etiologies. Small bowel loops proximal to this level are dilated, but oral contrast is seen distal to this level. Findings are compatible with partial small bowel obstruction. 2. New enhancing fluid collection in the pelvis may represent abscess. 3.  Nasogastric tube tip in the mid stomach. 4. New bilateral lower lobe atelectasis/airspace disease. 5. These results were called by telephone at the time of interpretation on 03/10/2021 at 8:33 pm to provider NP Chinita Greenland, who verbally acknowledged these results. Electronically Signed   By: Darliss Cheney M.D.   On: 03/10/2021 20:34   [place] EKG SITE RITE  Result Date: 03/11/2021 If Site Rite image not attached, placement could not be confirmed due to current cardiac rhythm.       Scheduled Meds:  Chlorhexidine Gluconate Cloth  6 each Topical Daily   diclofenac Sodium  2 g Topical QID   insulin aspart  0-20 Units Subcutaneous Q4H   lidocaine  1 patch Transdermal Q24H   metoprolol tartrate  2.5 mg Intravenous Q12H   pantoprazole (PROTONIX) IV  40 mg Intravenous Q12H   sodium chloride flush  10-40 mL Intracatheter Q12H   Continuous Infusions:  sodium chloride 35 mL/hr at 03/11/21 1736   methocarbamol (ROBAXIN) IV     piperacillin-tazobactam (ZOSYN)  IV 3.375 g (03/12/21 1017)   TPN ADULT (ION) 40 mL/hr at 03/11/21 1759   TPN ADULT (ION)       LOS: 8 days    Time spent: 35 minutes    Daniel Perkins A Codie Hainer, MD [facility]   If 7PM-7AM, please contact night-coverage [email or web address]  03/12/2021, 3:39 PM

## 2021-03-12 NOTE — Progress Notes (Signed)
  Mobility Specialist Criteria Algorithm Info.  Mobility Team: HOB elevated: Activity: Ambulated in hall; Transferred:  Chair to bed (in chair before to bed after) Range of motion: Active; All extremities Level of assistance: Minimal assist, patient does 75% or more Assistive device: Front wheel walker Distance ambulated (ft): 356 ft Mobility response: Tolerated well Bed Position: Semi-fowlers  Patient received in recliner chair eager to participate in mobility. Required min A sit <>stand from chair and min guard ambulating. Completed education on importance of frequent rest breaks with receptive teach back. Required one seated rest break secondary to fatigue and SOB. Returned to room without incident and requested to to get in bed. Was left lying supine in bed with all needs met and call bell in reach.   03/12/2021 4:09 PM

## 2021-03-12 NOTE — Progress Notes (Signed)
Patient ID: Daniel Perkins, male   DOB: 10-Dec-1955, 65 y.o.   MRN: [id number] [facility] Surgery Progress Note  7 Days Post-Op  Subjective: CC-  Reports less abdominal pain today. No longer passing flatus. Denies n/v. NG with 750cc out.  Objective: Vital signs in last 24 hours: Temp:  [97.6 F (36.4 C)-98.7 F (37.1 C)] 98.7 F (37.1 C) (11/04 0444) Pulse Rate:  [78-84] 82 (11/04 0444) Resp:  [18] 18 (11/03 1621) BP: (116-155)/(75-82) 145/80 (11/04 0444) SpO2:  [92 %-96 %] 92 % (11/04 0444) Weight:  [88.8 kg] 88.8 kg (11/03 1400) Last BM Date: 03/02/21  Intake/Output from previous day: 11/03 0701 - 11/04 0700 In: 1858.8 [I.V.:1307.7; IV Piggyback:551.1] Out: 950 [Urine:200; Emesis/NG output:750] Intake/Output this shift: Total I/O In: -  Out: 175 [Urine:175]  PE: Gen:  Alert, NAD, pleasant HEENT: NG tube with thin orange drainage Card:  RRR Pulm: CTAB, normal effort on room air Abd: soft, mild distension, few BS heard, appropriately tender, lap incisions and midline cdi with staples present and no erythema or drainage  Lab Results:  Recent Labs    03/11/21 0045 03/12/21 0340  WBC 12.3* 13.9*  HGB 11.6* 11.1*  HCT 35.8* 34.6*  PLT 396 391   BMET Recent Labs    03/11/21 0045 03/12/21 0340  NA 134* 136  K 3.7 3.7  CL 104 104  CO2 22 25  GLUCOSE 119* 126*  BUN 15 13  CREATININE 1.01 0.98  CALCIUM 8.6* 8.6*   PT/INR No results for input(s): LABPROT, INR in the last 72 hours. CMP     Component Value Date/Time   NA 136 03/12/2021 0340   NA 137 02/10/2021 1538   K 3.7 03/12/2021 0340   CL 104 03/12/2021 0340   CO2 25 03/12/2021 0340   GLUCOSE 126 (H) 03/12/2021 0340   BUN 13 03/12/2021 0340   BUN 13 02/10/2021 1538   CREATININE 0.98 03/12/2021 0340   CREATININE 1.22 06/30/2014 1634   CALCIUM 8.6 (L) 03/12/2021 0340   PROT 6.8 03/12/2021 0340   PROT 7.2 12/06/2018 1648   ALBUMIN 2.0 (L) 03/12/2021 0340   ALBUMIN 4.2 12/06/2018 1648   AST  22 03/12/2021 0340   ALT 20 03/12/2021 0340   ALKPHOS 78 03/12/2021 0340   BILITOT 1.0 03/12/2021 0340   BILITOT 0.3 12/06/2018 1648   GFRNONAA >60 03/12/2021 0340   GFRNONAA 78 10/23/2013 1554   GFRAA 86 06/09/2020 1508   GFRAA >89 10/23/2013 1554   Lipase  No results found for: LIPASE     Studies/Results: DG Abd 1 View  Result Date: 03/10/2021 CLINICAL DATA:  Feeding tube placement EXAM: ABDOMEN - 1 VIEW COMPARISON:  03/05/2021 FINDINGS: Large-bore esophagogastric tube is positioned with tip below the diaphragm, in the gastric fundus, and side port near the level of the gastroesophageal junction. Recommend advancement to ensure subdiaphragmatic positioning. There is no enteric feeding tube identified in the lower chest or abdomen per reported exam indication. Gas-filled, nondistended loops of small bowel and colon in the included upper abdomen. IMPRESSION: 1. Large-bore esophagogastric tube is positioned with tip below the diaphragm, in the gastric fundus, and side port near the level of the gastroesophageal junction. Recommend advancement to ensure subdiaphragmatic positioning of tip and side port. 2. No enteric feeding tube identified in the lower chest or abdomen per reported exam indication. Electronically Signed   By: Jearld Lesch M.D.   On: 03/10/2021 12:20   CT ABDOMEN PELVIS W CONTRAST  Result Date: 03/10/2021  CLINICAL DATA:  Abdominal distension with increased pain. Five days status post exploratory laparotomy and small bowel perforation. EXAM: CT ABDOMEN AND PELVIS WITH CONTRAST TECHNIQUE: Multidetector CT imaging of the abdomen and pelvis was performed using the standard protocol following bolus administration of intravenous contrast. CONTRAST:  OMNIPAQUE IOHEXOL 300 MG/ML  SOLN COMPARISON:  CT abdomen and pelvis 03/04/2021. FINDINGS: Lower chest: There are new bands of atelectasis/airspace disease in the bilateral lower lobes. Mild emphysematous changes are present.  Hepatobiliary: Hyperdensity in the gallbladder is new from prior, likely excreted contrast. No radiopaque gallstones or bile duct dilatation. No focal liver lesions are identified. Pancreas: Unremarkable. No pancreatic ductal dilatation or surrounding inflammatory changes. Spleen: Normal in size without focal abnormality. Adrenals/Urinary Tract: Adrenal glands are unremarkable. Kidneys are normal, without renal calculi, focal lesion, or hydronephrosis. Bladder is unremarkable. Stomach/Bowel: There is a moderate length segment of small bowel in the right abdomen demonstrating circumferential wall thickening/edema with mesenteric edema. There is no pneumatosis. Small bowel loops proximal to this level are dilated measuring up to 3.8 cm. Small bowel loops distal to this level are nondilated. Oral contrast reaches the distal ileum. Colon and appendix are within normal limits. Nasogastric tube tip is in the body of the stomach. The stomach is otherwise within normal limits. There is no free air. Vascular/Lymphatic: Aortic atherosclerosis. No enlarged abdominal or pelvic lymph nodes. Reproductive: Prostate is unremarkable. Other: There is a new peripherally enhancing fluid collection within the pelvis anterior to the rectum measuring 3.0 x 3.1 x 4.2 cm. There is no focal abdominal wall hernia. Midline skin staples are present. Injection sites noted in the anterior abdominal wall. Musculoskeletal: Stable mixed lucencies with coarsened trabeculae in the T12 and L1 vertebral bodies. IMPRESSION: 1. Moderate length segment small bowel in the right abdomen demonstrating wall thickening and mesenteric edema worrisome for nonspecific enteritis including infectious, inflammatory and ischemic etiologies. Small bowel loops proximal to this level are dilated, but oral contrast is seen distal to this level. Findings are compatible with partial small bowel obstruction. 2. New enhancing fluid collection in the pelvis may represent  abscess. 3. Nasogastric tube tip in the mid stomach. 4. New bilateral lower lobe atelectasis/airspace disease. 5. These results were called by telephone at the time of interpretation on 03/10/2021 at 8:33 pm to provider NP Chinita Greenland, who verbally acknowledged these results. Electronically Signed   By: Darliss Cheney M.D.   On: 03/10/2021 20:34   [place] EKG SITE RITE  Result Date: 03/11/2021 If Site Rite image not attached, placement could not be confirmed due to current cardiac rhythm.   Anti-infectives: Anti-infectives (From admission, onward)    Start     Dose/Rate Route Frequency Ordered Stop   03/11/21 0900  piperacillin-tazobactam (ZOSYN) IVPB 3.375 g        3.375 g 12.5 mL/hr over 240 Minutes Intravenous Every 8 hours 03/11/21 0815     03/05/21 0845  cefoTEtan (CEFOTAN) 2 g in sodium chloride 0.9 % 100 mL IVPB        2 g 200 mL/hr over 30 Minutes Intravenous To ShortStay Surgical 03/04/21 2206 03/05/21 1035        Assessment/Plan SBO with no previous abdominal surgical history  POD#7 S/P diagnostic laparoscopy, lysis of single adhesive band in the left lower quadrant with resolution of bowel obstruction, mini laparotomy with primary repair of 2 mm small bowel perforation on 03/05/21 Dr. Fredricka Bonine - CT scan 11/2 with some nonspecific thickened and dilated small bowel, possible ileus  or partial SBO; small pelvic fluid collection - Fluid collection on CT is small and not amenable to drainage. Continue zosyn - Continue NPO/NGT to LIWS today and await return in bowel function. Ok for ice chips and 1 popsicle. Continue TPN - Mobilize. Working with PT/OT who are currently recommending CIR - WBC up 13.9, afebrile. He just had a CT scan 2 days ago. monitor   ID - cefotetan periop 10/28, zosyn 11/3>> VTE - SCDs, sq heparin FEN - IVF, NGT to LIWS, TPN Foley - none   DM HTN CAD h/o MI several years ago - followed by Dr. Eldridge Dace PAD HLD Chronic neck pain   LOS: 8 days    Franne Forts, [facility] Surgery 03/12/2021, 9:08 AM Please see Amion for pager number during day hours 7:00am-4:30pm

## 2021-03-13 DIAGNOSIS — K56609 Unspecified intestinal obstruction, unspecified as to partial versus complete obstruction: Secondary | ICD-10-CM | POA: Diagnosis not present

## 2021-03-13 LAB — BASIC METABOLIC PANEL
Anion gap: 6 (ref 5–15)
BUN: 13 mg/dL (ref 8–23)
CO2: 24 mmol/L (ref 22–32)
Calcium: 8.8 mg/dL — ABNORMAL LOW (ref 8.9–10.3)
Chloride: 104 mmol/L (ref 98–111)
Creatinine, Ser: 0.91 mg/dL (ref 0.61–1.24)
GFR, Estimated: >60 mL/min (ref >60)
Glucose, Bld: 166 mg/dL — ABNORMAL HIGH (ref 70–99)
Potassium: 4.2 mmol/L (ref 3.5–5.1)
Sodium: 134 mmol/L — ABNORMAL LOW (ref 135–145)

## 2021-03-13 LAB — CBC WITH DIFFERENTIAL/PLATELET
Abs Immature Granulocytes: 0.5 10*3/uL — ABNORMAL HIGH (ref 0.00–0.07)
Basophils Absolute: 0 10*3/uL (ref 0.0–0.1)
Basophils Relative: 0 %
Eosinophils Absolute: 0.4 10*3/uL (ref 0.0–0.5)
Eosinophils Relative: 3 %
HCT: 35.2 % — ABNORMAL LOW (ref 39.0–52.0)
Hemoglobin: 11.4 g/dL — ABNORMAL LOW (ref 13.0–17.0)
Immature Granulocytes: 4 %
Lymphocytes Relative: 15 %
Lymphs Abs: 1.8 10*3/uL (ref 0.7–4.0)
MCH: 22.2 pg — ABNORMAL LOW (ref 26.0–34.0)
MCHC: 32.4 g/dL (ref 30.0–36.0)
MCV: 68.5 fL — ABNORMAL LOW (ref 80.0–100.0)
Monocytes Absolute: 1.6 10*3/uL — ABNORMAL HIGH (ref 0.1–1.0)
Monocytes Relative: 13 %
Neutro Abs: 7.6 10*3/uL (ref 1.7–7.7)
Neutrophils Relative %: 65 %
Platelets: 431 10*3/uL — ABNORMAL HIGH (ref 150–400)
RBC: 5.14 MIL/uL (ref 4.22–5.81)
RDW: 14.3 % (ref 11.5–15.5)
WBC: 11.8 10*3/uL — ABNORMAL HIGH (ref 4.0–10.5)
nRBC: 0 % (ref 0.0–0.2)

## 2021-03-13 LAB — GLUCOSE, CAPILLARY
Glucose-Capillary: 147 mg/dL — ABNORMAL HIGH (ref 70–99)
Glucose-Capillary: 154 mg/dL — ABNORMAL HIGH (ref 70–99)
Glucose-Capillary: 156 mg/dL — ABNORMAL HIGH (ref 70–99)
Glucose-Capillary: 159 mg/dL — ABNORMAL HIGH (ref 70–99)
Glucose-Capillary: 177 mg/dL — ABNORMAL HIGH (ref 70–99)
Glucose-Capillary: 178 mg/dL — ABNORMAL HIGH (ref 70–99)

## 2021-03-13 LAB — MAGNESIUM: Magnesium: 2.1 mg/dL (ref 1.7–2.4)

## 2021-03-13 LAB — PHOSPHORUS: Phosphorus: 4 mg/dL (ref 2.5–4.6)

## 2021-03-13 MED ORDER — TRAVASOL 10 % IV SOLN
INTRAVENOUS | Status: AC
  Filled 2021-03-13: qty 1252.8

## 2021-03-13 MED ORDER — ACETAMINOPHEN 10 MG/ML IV SOLN: 1000.0000 mg | Freq: Four times a day (QID) | INTRAVENOUS | Status: AC | PRN

## 2021-03-13 NOTE — Progress Notes (Signed)
PHARMACY - TOTAL PARENTERAL NUTRITION CONSULT NOTE  Indication: Prolonged ileus  Patient Measurements: Height: 5\' 7"  (170.2 cm) Weight: 88.8 kg (195 lb 12.8 oz) IBW/kg (Calculated) : 66.1 TPN AdjBW (KG): 71.8 Body mass index is 30.67 kg/m.  Assessment:  65 YOM presented on 10/27 with acute abdominal pain, nausea and vomiting after dinner.  CT showed SBO and patient underwent ex-lap with lysis of single LLQ adhesion and repair of small bowel perforation on 10/28.  NG tube clamped and clears started 11/1.  Subsequently had nausea and lower abdominal pain, so NG tube switched back to LIWS.  Repeat CT on 11/2 showed nonspecific thickened and dilated small bowel, possible ileus or pSBO; small pelvic fluid collection.  Patient has inadequate nutrition for 7 days, so Pharmacy consulted for TPN management.  Patient reports he typically eats 3 meals a day.  He loves steak and chicken; eats canned vegetables and fruits.  He weighs 237 lbs earlier in the year, lost ~30 lbs over the summer.  Glucose / Insulin: DM on glipizide, Victoza and Jardiance PTA, A1c 6%.   CBGs well controlled - used 21 units SSI in past 24 hrs Electrolytes: Na 134, K 4.2 (goal >/= 4), others WNL Renal: SCr 0.91, BUN WNL  Hepatic: LFTs / tbili WNL; Tgs 138  Intake / Output; MIVF: UOP 0.7 ml/kg/hr, NG 03-04-1976, LBM PTA 10/26. GI Imaging: none since TPN GI Surgeries / Procedures: none since TPN  Central access: PICC placed 03/11/21 TPN start date: 03/11/21  Nutritional Goals: RD assessment 11/3: Estimated Needs Total Energy Estimated Needs: 2100-2300 kcals Total Protein Estimated Needs: 120-135 g Total Fluid Estimated Needs: >/= 2 L  Current Nutrition:  NPO  Plan:  Continue TPN at 90 mL/hr (goal rate) Electrolytes in TPN: incr Na 125 mEq/L, K 70mEq/L, Ca 53mEq/L, Mg 26mEq/L, Phos 42mmol/L. Cl:Ac 1:1 Add standard MVI and trace elements to TPN Change resistant SSI to Q4H.  Empirically increase to 20 units regular insulin to  TPN. Standard TPN labs and nursing care orders   12m, PharmD, [facility] Clinical Pharmacist Please see AMION for all Pharmacists' Contact Phone Numbers 03/13/2021, 7:02 AM

## 2021-03-13 NOTE — Progress Notes (Signed)
Pt declined any popsicles all day, did pass some gas this pm but ileus persists

## 2021-03-13 NOTE — Progress Notes (Signed)
PROGRESS NOTE    Daniel Perkins  ASN:[id number] DOB: July 21, 1955 DOA: 03/04/2021 PCP: Ellyn Hack, MD   Brief Narrative: 65 year old with past medical history significant for hypertension, diabetes type 2, CAD, peripheral artery disease, hyperlipidemia, presented with acute onset of abdominal pain and found to have a small bowel obstruction with transition zone on the left side.  Admitted with surgical consultation.  Seen by cardiology for preop clearance. He underwent ex lap, small bowel perforation repair on 10/28.  Post operative, patient with persistent ileus.  Plan to continue with NG tube. CT 11/02 showed ileus, fluid collection. Patient started on IV antibiotics. TPN>    Assessment & Plan:   Active Problems:   Diabetes mellitus due to underlying condition, uncontrolled, with hyperglycemia (HCC)   Hyperlipemia   HYPERTENSION   Gout   SBO (small bowel obstruction) (HCC)   Malnutrition of moderate degree  1-SBO due to adhesion of the small bowel and perforation. -Postoperative persistent ileus, fluid collection.  -Continue with NG tube.  -Started on TPN 11/03. -CT abdomen showed: Moderate length segment small bowel in the right abdomen demonstrating wall thickening and mesenteric edema worrisome for nonspecific enteritis including infectious, inflammatory and ischemic etiologies. Small bowel loops proximal to this level are dilated, but oral contrast is seen distal to this level. Findings are compatible with partial small bowel obstruction. New enhancing fluid collection in the pelvis may represent abscess. -Continue with  IV zosyn.  -250 out put from NGT  Continue with NPO, allows popsicle per sx.   2-Diabetes type 2: Continue to hold Victoza and Jardiance at home. Continue with a sliding scale insulin.  3-HTN;  Currently NOP, holding Oral meds.  He was on carvedilol at home, now on  IV metoprolol schedule.  PRN hydralazine PRN.   4-History of CAD: Holding  aspirin. Hyperlipidemia: Holding statins resume when able to take oral AKI: Improved with IV fluids. Feet pain;  uric acid normal. Continue with  voltaren gel.    Estimated body mass index is 30.67 kg/m as calculated from the following:   Height as of this encounter: 5\' 7"  (1.702 m).   Weight as of this encounter: 88.8 kg.   DVT prophylaxis: start heparin  Code Status: Full code Family Communication: Discussed with patient daughter over phone 11/05 Disposition Plan:  Status is: Inpatient  Remains inpatient appropriate because: Persistent ileus post op        Consultants:  General surgery  Procedures:  Diagnostic laparoscopy, lysis of single addition band in the left lower quadrant breast solution of with obstruction, mini laparotomy with primary repair of 2 mm small bowel perforation on 10/28 by Dr. 11/28  Antimicrobials:  None  Subjective: He is alert, complain of pain from NG tube  Objective: Vitals:   03/12/21 1433 03/12/21 1938 03/13/21 0402 03/13/21 0811  BP: 120/81 (!) 144/84 (!) 146/87 (!) 154/89  Pulse: 80 81 83 83  Resp:  17 16 19   Temp: 98.1 F (36.7 C) 98.8 F (37.1 C) 97.6 F (36.4 C) 98.8 F (37.1 C)  TempSrc: Oral Oral Oral Oral  SpO2: 98% 93% 94% 94%  Weight:      Height:        Intake/Output Summary (Last 24 hours) at 03/13/2021 1347 Last data filed at 03/13/2021 1241 Gross per 24 hour  Intake 1832.43 ml  Output 1600 ml  Net 232.43 ml    Filed Weights   03/04/21 0256 03/11/21 1400  Weight: 94.8 kg 88.8 kg  Examination:  General exam: NAD Respiratory system: CTA Cardiovascular system: S 1, S 2 RRR Gastrointestinal system: BS decreased, soft nt.  incision cover  Central nervous system: Alert, follows command Extremities: No edema    Data Reviewed: I have personally reviewed following labs and imaging studies  CBC: Recent Labs  Lab 03/08/21 0055 03/09/21 0820 03/10/21 0149 03/11/21 0045 03/12/21 0340 03/13/21 0412   WBC 12.7* 14.2* 13.5* 12.3* 13.9* 11.8*  NEUTROABS 9.0*  --  9.6* 7.7 8.9* 7.6  HGB 11.7* 12.6* 12.2* 11.6* 11.1* 11.4*  HCT 37.8* 39.3 37.3* 35.8* 34.6* 35.2*  MCV 72.7* 69.2* 68.3* 69.0* 69.2* 68.5*  PLT 287 369 367 396 391 431*    Basic Metabolic Panel: Recent Labs  Lab 03/08/21 0055 03/09/21 0155 03/10/21 0149 03/11/21 0045 03/12/21 0340 03/13/21 0412  NA 133* 134* 133* 134* 136 134*  K 3.9 3.7 3.6 3.7 3.7 4.2  CL 102 105 104 104 104 104  CO2 22 20* 19* 22 25 24   GLUCOSE 118* 119* 143* 119* 126* 166*  BUN 16 15 15 15 13 13   CREATININE 1.14 1.04 0.97 1.01 0.98 0.91  CALCIUM 8.9 8.8* 8.8* 8.6* 8.6* 8.8*  MG 2.5*  --   --  2.2 2.2 2.1  PHOS 3.0  --   --  3.8 3.9 4.0    GFR: Estimated Creatinine Clearance: 87.2 mL/min (by C-G formula based on SCr of 0.91 mg/dL). Liver Function Tests: Recent Labs  Lab 03/12/21 0340  AST 22  ALT 20  ALKPHOS 78  BILITOT 1.0  PROT 6.8  ALBUMIN 2.0*    No results for input(s): LIPASE, AMYLASE in the last 168 hours. No results for input(s): AMMONIA in the last 168 hours. Coagulation Profile: No results for input(s): INR, PROTIME in the last 168 hours. Cardiac Enzymes: No results for input(s): CKTOTAL, CKMB, CKMBINDEX, TROPONINI in the last 168 hours. BNP (last 3 results) No results for input(s): PROBNP in the last 8760 hours. HbA1C: No results for input(s): HGBA1C in the last 72 hours. CBG: Recent Labs  Lab 03/12/21 1942 03/12/21 2351 03/13/21 0401 03/13/21 0816 03/13/21 1239  GLUCAP 181* 149* 159* 177* 178*    Lipid Profile: Recent Labs    03/12/21 0340  TRIG 138    Thyroid Function Tests: No results for input(s): TSH, T4TOTAL, FREET4, T3FREE, THYROIDAB in the last 72 hours. Anemia Panel: No results for input(s): VITAMINB12, FOLATE, FERRITIN, TIBC, IRON, RETICCTPCT in the last 72 hours. Sepsis Labs: No results for input(s): PROCALCITON, LATICACIDVEN in the last 168 hours.   Recent Results (from the past 240  hour(s))  Resp Panel by RT-PCR (Flu A&B, Covid) Nasopharyngeal Swab     Status: None   Collection Time: 03/04/21  7:44 AM   Specimen: Nasopharyngeal Swab; Nasopharyngeal(NP) swabs in vial transport medium  Result Value Ref Range Status   SARS Coronavirus 2 by RT PCR NEGATIVE NEGATIVE Final    Comment: (NOTE) SARS-CoV-2 target nucleic acids are NOT DETECTED.  The SARS-CoV-2 RNA is generally detectable in upper respiratory specimens during the acute phase of infection. The lowest concentration of SARS-CoV-2 viral copies this assay can detect is 138 copies/mL. A negative result does not preclude SARS-Cov-2 infection and should not be used as the sole basis for treatment or other patient management decisions. A negative result may occur with  improper specimen collection/handling, submission of specimen other than nasopharyngeal swab, presence of viral mutation(s) within the areas targeted by this assay, and inadequate number of viral copies(<138 copies/mL).  A negative result must be combined with clinical observations, patient history, and epidemiological information. The expected result is Negative.  Fact Sheet for Patients:  BloggerCourse.com  Fact Sheet for Healthcare Providers:  SeriousBroker.it  This test is no t yet approved or cleared by the [place] FDA and  has been authorized for detection and/or diagnosis of SARS-CoV-2 by FDA under an Emergency Use Authorization (EUA). This EUA will remain  in effect (meaning this test can be used) for the duration of the COVID-19 declaration under Section 564(b)(1) of the Act, 21 U.S.C.section 360bbb-3(b)(1), unless the authorization is terminated  or revoked sooner.       Influenza A by PCR NEGATIVE NEGATIVE Final   Influenza B by PCR NEGATIVE NEGATIVE Final    Comment: (NOTE) The Xpert Xpress SARS-CoV-2/FLU/RSV plus assay is intended as an aid in the diagnosis of influenza from  Nasopharyngeal swab specimens and should not be used as a sole basis for treatment. Nasal washings and aspirates are unacceptable for Xpert Xpress SARS-CoV-2/FLU/RSV testing.  Fact Sheet for Patients: BloggerCourse.com  Fact Sheet for Healthcare Providers: SeriousBroker.it  This test is not yet approved or cleared by the [place] FDA and has been authorized for detection and/or diagnosis of SARS-CoV-2 by FDA under an Emergency Use Authorization (EUA). This EUA will remain in effect (meaning this test can be used) for the duration of the COVID-19 declaration under Section 564(b)(1) of the Act, 21 U.S.C. section 360bbb-3(b)(1), unless the authorization is terminated or revoked.  Performed at [facility], [address], [place], [place] [address]   Surgical pcr screen     Status: None   Collection Time: 03/05/21  2:58 AM   Specimen: Nasal Mucosa; Nasal Swab  Result Value Ref Range Status   MRSA, PCR NEGATIVE NEGATIVE Final   Staphylococcus aureus NEGATIVE NEGATIVE Final    Comment: (NOTE) The Xpert SA Assay (FDA approved for NASAL specimens in patients 65 years of age and older), is one component of a comprehensive surveillance program. It is not intended to diagnose infection nor to guide or monitor treatment. Performed at [facility], [address], [place], [place] [address]           Radiology Studies: No results found.      Scheduled Meds:  Chlorhexidine Gluconate Cloth  6 each Topical Daily   diclofenac Sodium  2 g Topical QID   heparin injection (subcutaneous)  5,000 Units Subcutaneous Q8H   insulin aspart  0-20 Units Subcutaneous Q4H   lidocaine  1 patch Transdermal Q24H   metoprolol tartrate  2.5 mg Intravenous Q12H   pantoprazole (PROTONIX) IV  40 mg Intravenous Q12H   sodium chloride flush  10-40 mL Intracatheter Q12H   Continuous Infusions:  acetaminophen     methocarbamol  (ROBAXIN) IV     piperacillin-tazobactam (ZOSYN)  IV 3.375 g (03/13/21 0854)   TPN ADULT (ION) 90 mL/hr at 03/12/21 1707   TPN ADULT (ION)       LOS: 9 days    Time spent: 35 minutes    Emmarie Sannes A Veleka Djordjevic, MD [facility]   If 7PM-7AM, please contact night-coverage [email or web address]  03/13/2021, 1:47 PM

## 2021-03-13 NOTE — Progress Notes (Signed)
Patient ID: Daniel Perkins, male   DOB: Jan 13, 1956, 65 y.o.   MRN: [id number] [facility] Surgery Progress Note:   8 Days Post-Op  Subjective: Mental status is clear.  Complaints NG irritation-sham feeding with popsicle. Objective: Vital signs in last 24 hours: Temp:  [97.6 F (36.4 C)-98.8 F (37.1 C)] 98.8 F (37.1 C) (11/05 0811) Pulse Rate:  [77-83] 83 (11/05 0811) Resp:  [16-19] 19 (11/05 0811) BP: (120-154)/(81-89) 154/89 (11/05 0811) SpO2:  [93 %-98 %] 94 % (11/05 0811)  Intake/Output from previous day: 11/04 0701 - 11/05 0700 In: 2333.4 [P.O.:138; I.V.:1901.9; IV Piggyback:293.5] Out: 1700 [Urine:1425; Emesis/NG output:275] Intake/Output this shift: Total I/O In: -  Out: 300 [Urine:300]  Physical Exam: Work of breathing is normal;  TNA infusing.  Ileus persistent.    Lab Results:  Results for orders placed or performed during the hospital encounter of 03/04/21 (from the past 48 hour(s))  Glucose, capillary     Status: Abnormal   Collection Time: 03/11/21 11:59 AM  Result Value Ref Range   Glucose-Capillary 114 (H) 70 - 99 mg/dL    Comment: Glucose reference range applies only to samples taken after fasting for at least 8 hours.  Glucose, capillary     Status: None   Collection Time: 03/11/21  4:18 PM  Result Value Ref Range   Glucose-Capillary 99 70 - 99 mg/dL    Comment: Glucose reference range applies only to samples taken after fasting for at least 8 hours.  Uric acid     Status: None   Collection Time: 03/11/21  7:06 PM  Result Value Ref Range   Uric Acid, Serum 6.8 3.7 - 8.6 mg/dL    Comment: Performed at [facility] [address], [place], [place] [address]  Glucose, capillary     Status: Abnormal   Collection Time: 03/11/21  8:20 PM  Result Value Ref Range   Glucose-Capillary 129 (H) 70 - 99 mg/dL    Comment: Glucose reference range applies only to samples taken after fasting for at least 8 hours.  Glucose, capillary     Status: Abnormal    Collection Time: 03/11/21 11:33 PM  Result Value Ref Range   Glucose-Capillary 121 (H) 70 - 99 mg/dL    Comment: Glucose reference range applies only to samples taken after fasting for at least 8 hours.  CBC with Differential/Platelet     Status: Abnormal   Collection Time: 03/12/21  3:40 AM  Result Value Ref Range   WBC 13.9 (H) 4.0 - 10.5 K/uL   RBC 5.00 4.22 - 5.81 MIL/uL   Hemoglobin 11.1 (L) 13.0 - 17.0 g/dL   HCT 34.6 (L) 39.0 - 52.0 %   MCV 69.2 (L) 80.0 - 100.0 fL   MCH 22.2 (L) 26.0 - 34.0 pg   MCHC 32.1 30.0 - 36.0 g/dL   RDW 14.4 11.5 - 15.5 %   Platelets 391 150 - 400 K/uL    Comment: REPEATED TO VERIFY   nRBC 0.0 0.0 - 0.2 %   Neutrophils Relative % 64 %   Neutro Abs 8.9 (H) 1.7 - 7.7 K/uL   Lymphocytes Relative 21 %   Lymphs Abs 2.9 0.7 - 4.0 K/uL   Monocytes Relative 11 %   Monocytes Absolute 1.5 (H) 0.1 - 1.0 K/uL   Eosinophils Relative 3 %   Eosinophils Absolute 0.4 0.0 - 0.5 K/uL   Basophils Relative 0 %   Basophils Absolute 0.0 0.0 - 0.1 K/uL   WBC Morphology  MORPHOLOGY UNREMARKABLE    RBC Morphology MORPHOLOGY UNREMARKABLE    Smear Review PLATELETS APPEAR ADEQUATE    Promyelocytes Relative 1 %   Abs Immature Granulocytes 0.10 (H) 0.00 - 0.07 K/uL   Giant PLTs PRESENT     Comment: Performed at [facility], [address], [place], [place] [address]  Comprehensive metabolic panel     Status: Abnormal   Collection Time: 03/12/21  3:40 AM  Result Value Ref Range   Sodium 136 135 - 145 mmol/L   Potassium 3.7 3.5 - 5.1 mmol/L   Chloride 104 98 - 111 mmol/L   CO2 25 22 - 32 mmol/L   Glucose, Bld 126 (H) 70 - 99 mg/dL    Comment: Glucose reference range applies only to samples taken after fasting for at least 8 hours.   BUN 13 8 - 23 mg/dL   Creatinine, Ser 6.27 0.61 - 1.24 mg/dL   Calcium 8.6 (L) 8.9 - 10.3 mg/dL   Total Protein 6.8 6.5 - 8.1 g/dL   Albumin 2.0 (L) 3.5 - 5.0 g/dL   AST 22 15 - 41 U/L   ALT 20 0 - 44 U/L   Alkaline Phosphatase  78 38 - 126 U/L   Total Bilirubin 1.0 0.3 - 1.2 mg/dL   GFR, Estimated >03 >50 mL/min    Comment: (NOTE) Calculated using the CKD-EPI Creatinine Equation (2021)    Anion gap 7 5 - 15    Comment: Performed at [facility], [address], [place], [place] [address]  Magnesium     Status: None   Collection Time: 03/12/21  3:40 AM  Result Value Ref Range   Magnesium 2.2 1.7 - 2.4 mg/dL    Comment: Performed at [facility], [address], [place], [place] [address]  Phosphorus     Status: None   Collection Time: 03/12/21  3:40 AM  Result Value Ref Range   Phosphorus 3.9 2.5 - 4.6 mg/dL    Comment: Performed at [facility], [address], [place], [place] [address]  Triglycerides     Status: None   Collection Time: 03/12/21  3:40 AM  Result Value Ref Range   Triglycerides 138 <150 mg/dL    Comment: Performed at [facility], [address], [place], [place] [address]  Glucose, capillary     Status: Abnormal   Collection Time: 03/12/21  4:41 AM  Result Value Ref Range   Glucose-Capillary 135 (H) 70 - 99 mg/dL    Comment: Glucose reference range applies only to samples taken after fasting for at least 8 hours.  Glucose, capillary     Status: Abnormal   Collection Time: 03/12/21  7:46 AM  Result Value Ref Range   Glucose-Capillary 146 (H) 70 - 99 mg/dL    Comment: Glucose reference range applies only to samples taken after fasting for at least 8 hours.   Comment 1 Notify RN   Glucose, capillary     Status: Abnormal   Collection Time: 03/12/21 12:57 PM  Result Value Ref Range   Glucose-Capillary 159 (H) 70 - 99 mg/dL    Comment: Glucose reference range applies only to samples taken after fasting for at least 8 hours.  Glucose, capillary     Status: Abnormal   Collection Time: 03/12/21  5:31 PM  Result Value Ref Range   Glucose-Capillary 145 (H) 70 - 99 mg/dL    Comment: Glucose reference range applies only to samples  taken after fasting for at least 8  hours.  Glucose, capillary     Status: Abnormal   Collection Time: 03/12/21  7:42 PM  Result Value Ref Range   Glucose-Capillary 181 (H) 70 - 99 mg/dL    Comment: Glucose reference range applies only to samples taken after fasting for at least 8 hours.  Glucose, capillary     Status: Abnormal   Collection Time: 03/12/21 11:51 PM  Result Value Ref Range   Glucose-Capillary 149 (H) 70 - 99 mg/dL    Comment: Glucose reference range applies only to samples taken after fasting for at least 8 hours.  Glucose, capillary     Status: Abnormal   Collection Time: 03/13/21  4:01 AM  Result Value Ref Range   Glucose-Capillary 159 (H) 70 - 99 mg/dL    Comment: Glucose reference range applies only to samples taken after fasting for at least 8 hours.  CBC with Differential/Platelet     Status: Abnormal   Collection Time: 03/13/21  4:12 AM  Result Value Ref Range   WBC 11.8 (H) 4.0 - 10.5 K/uL   RBC 5.14 4.22 - 5.81 MIL/uL   Hemoglobin 11.4 (L) 13.0 - 17.0 g/dL   HCT 13.235.2 (L) 44.039.0 - 10.252.0 %   MCV 68.5 (L) 80.0 - 100.0 fL   MCH 22.2 (L) 26.0 - 34.0 pg   MCHC 32.4 30.0 - 36.0 g/dL   RDW 72.514.3 36.611.5 - 44.015.5 %   Platelets 431 (H) 150 - 400 K/uL   nRBC 0.0 0.0 - 0.2 %   Neutrophils Relative % 65 %   Neutro Abs 7.6 1.7 - 7.7 K/uL   Lymphocytes Relative 15 %   Lymphs Abs 1.8 0.7 - 4.0 K/uL   Monocytes Relative 13 %   Monocytes Absolute 1.6 (H) 0.1 - 1.0 K/uL   Eosinophils Relative 3 %   Eosinophils Absolute 0.4 0.0 - 0.5 K/uL   Basophils Relative 0 %   Basophils Absolute 0.0 0.0 - 0.1 K/uL   Immature Granulocytes 4 %   Abs Immature Granulocytes 0.50 (H) 0.00 - 0.07 K/uL    Comment: Performed at [facility], [address] [place], [place] [address]  Basic metabolic panel     Status: Abnormal   Collection Time: 03/13/21  4:12 AM  Result Value Ref Range   Sodium 134 (L) 135 - 145 mmol/L   Potassium 4.2 3.5 - 5.1 mmol/L   Chloride 104 98 - 111 mmol/L   CO2 24 22 - 32 mmol/L   Glucose, Bld  166 (H) 70 - 99 mg/dL    Comment: Glucose reference range applies only to samples taken after fasting for at least 8 hours.   BUN 13 8 - 23 mg/dL   Creatinine, Ser 5.950.91 0.61 - 1.24 mg/dL   Calcium 8.8 (L) 8.9 - 10.3 mg/dL   GFR, Estimated >63>60 >87>60 mL/min    Comment: (NOTE) Calculated using the CKD-EPI Creatinine Equation (2021)    Anion gap 6 5 - 15    Comment: Performed at [facility], [address] [place], [place] [address]  Magnesium     Status: None   Collection Time: 03/13/21  4:12 AM  Result Value Ref Range   Magnesium 2.1 1.7 - 2.4 mg/dL    Comment: Performed at [facility], [address] [place], [place] [address]  Phosphorus     Status: None   Collection Time: 03/13/21  4:12 AM  Result Value  Ref Range   Phosphorus 4.0 2.5 - 4.6 mg/dL    Comment: Performed at [facility] [address], [place], [place] [address]  Glucose, capillary     Status: Abnormal   Collection Time: 03/13/21  8:16 AM  Result Value Ref Range   Glucose-Capillary 177 (H) 70 - 99 mg/dL    Comment: Glucose reference range applies only to samples taken after fasting for at least 8 hours.    Radiology/Results: No results found.  Anti-infectives: Anti-infectives (From admission, onward)    Start     Dose/Rate Route Frequency Ordered Stop   03/11/21 0900  piperacillin-tazobactam (ZOSYN) IVPB 3.375 g        3.375 g 12.5 mL/hr over 240 Minutes Intravenous Every 8 hours 03/11/21 0815     03/05/21 0845  cefoTEtan (CEFOTAN) 2 g in sodium chloride 0.9 % 100 mL IVPB        2 g 200 mL/hr over 30 Minutes Intravenous To [facility] Surgical 03/04/21 2206 03/05/21 1035       Assessment/Plan: Problem List: Patient Active Problem List   Diagnosis Date Noted   Malnutrition of moderate degree 03/11/2021   SBO (small bowel obstruction) ([facility] [place]) 03/04/2021   Left hand pain 06/22/2020   Back pain 05/20/2020   Neck pain 08/27/2019   CKD (chronic kidney disease), 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    Hepatic steatosis 05/01/2019   Prostate hypertrophy 12/06/2018   Sleep apnea-like behavior 10/01/2014   Peripheral vascular disease ([place]) 02/10/2014   Microcytic anemia 02/06/2013   CTEV (congenital talipes equinovarus)    Statin intolerance 06/29/2012   Esophageal reflux 06/29/2012   Health care maintenance 06/28/2012   Gout 06/12/2012   Obesity, Class II, BMI 35-39.9 02/20/2012   DOE (dyspnea on exertion) 02/20/2012   Diabetes mellitus due to underlying condition, uncontrolled, with hyperglycemia ([place]) 09/06/2009   ERECTILE DYSFUNCTION, NON-ORGANIC 02/16/2009   Hyperlipemia 01/14/2009   HYPERTENSION 01/14/2009    Slowly resolving postop ileus.  Continue NG 8 Days Post-Op    LOS: 9 days   Matt B. Hassell Done, MD, [facility] [phone] to reach the surgeon on call.    03/13/2021 9:24 AM

## 2021-03-13 NOTE — Plan of Care (Signed)
  Problem: Clinical Measurements: Goal: Will remain free from infection Outcome: Not Progressing   Problem: Clinical Measurements: Goal: Diagnostic test results will improve Outcome: Not Progressing   Problem: Clinical Measurements: Goal: Cardiovascular complication will be avoided Outcome: Not Progressing   Problem: Elimination: Goal: Will not experience complications related to bowel motility Outcome: Not Progressing   Problem: Elimination: Goal: Will not experience complications related to urinary retention Outcome: Not Progressing   Problem: Pain Managment: Goal: General experience of comfort will improve Outcome: Not Progressing

## 2021-03-13 NOTE — Progress Notes (Signed)
Occupational Therapy Treatment Patient Details Name: Jovante Hammitt MRN: [id number] DOB: [birth year] Today's Date: 03/13/2021   History of present illness 65 y/o M admitted to ED for acute abdominal pain, found to have small bowel obstruction. S/p ex lap, small bowel perforation repair on 10/28. PMH includes CAD/MI, gout, CKD, HTN, DM and ACDF.   OT comments  Pt is slowly progressing towards OT goals. Remains limited by pain, balance, and activity tolerance. During session, pt completed grooming with setup in sitting position. Pt also simulated toilet transfer with Min guard. Pt feels that he would be functioning at a much higher level if he was not experiencing so much pain in his feet. Continues to be appropriate for CIR at d/c. Will continue to follow acutely.   Recommendations for follow up therapy are one component of a multi-disciplinary discharge planning process, led by the attending physician.  Recommendations may be updated based on patient status, additional functional criteria and insurance authorization.    Follow Up Recommendations  Acute inpatient rehab (3hours/day)    Assistance Recommended at Discharge Intermittent Supervision/Assistance  Equipment Recommendations  None recommended by OT    Recommendations for Other Services      Precautions / Restrictions Precautions Precautions: Other (comment) (abdominal precautions) Precaution Comments: NG tube, abdominal precautions Restrictions Weight Bearing Restrictions: No Other Position/Activity Restrictions: no lifting >10lbs       Mobility Bed Mobility Overal bed mobility: Needs Assistance         Sit to supine: Min guard        Transfers Overall transfer level: Needs assistance Equipment used: Rolling walker (2 wheels) Transfers: Sit to/from Stand Sit to Stand: Min guard                 Balance Overall balance assessment: Needs assistance Sitting-balance support: Bilateral upper extremity  supported Sitting balance-Leahy Scale: Fair     Standing balance support: Bilateral upper extremity supported Standing balance-Leahy Scale: Poor                             ADL either performed or assessed with clinical judgement   ADL Overall ADL's : Needs assistance/impaired     Grooming: Wash/dry face;Oral care;Set up;Sitting Grooming Details (indicate cue type and reason): Stated too much pain in feet when WB to attempt standing                 Toilet Transfer: Min guard;Ambulation;Rolling walker (2 wheels) Toilet Transfer Details (indicate cue type and reason): simulated transfer from recliner to bed. Ambulated approx. 5 feet.         Functional mobility during ADLs: Min guard;Rolling walker (2 wheels) General ADL Comments: Pt remains limited by pain, balance, and activity tolerance. States that pain is worst in feet. Motivated, states he wishes he had less pain so he could participate more.     Vision       Perception     Praxis      Cognition Arousal/Alertness: Awake/alert Behavior During Therapy: WFL for tasks assessed/performed Overall Cognitive Status: Within Functional Limits for tasks assessed                                            Exercises     Shoulder Instructions       General Comments      Pertinent  Vitals/ Pain       Pain Assessment: 0-10 Pain Score: 8  Pain Location: abdomen and top of bilateral feet Pain Descriptors / Indicators: Aching;Discomfort;Dull;Sore Pain Intervention(s): Limited activity within patient's tolerance;Monitored during session;Repositioned  Home Living                                          Prior Functioning/Environment              Frequency  Min 2X/week        Progress Toward Goals  OT Goals(current goals can now be found in the care plan section)  Progress towards OT goals: Progressing toward goals  Acute Rehab OT Goals Patient Stated  Goal: reduce pain OT Goal Formulation: With patient Time For Goal Achievement: 03/23/21 Potential to Achieve Goals: Good ADL Goals Pt Will Perform Upper Body Dressing: sitting;with supervision Pt Will Perform Lower Body Dressing: sit to/from stand;with min guard assist Pt Will Transfer to Toilet: regular height toilet;with min guard assist Pt Will Perform Tub/Shower Transfer: with min assist  Plan Discharge plan remains appropriate;Frequency remains appropriate    Co-evaluation                 AM-PAC OT "6 Clicks" Daily Activity     Outcome Measure   Help from another person eating meals?: A Little Help from another person taking care of personal grooming?: A Little Help from another person toileting, which includes using toliet, bedpan, or urinal?: A Little Help from another person bathing (including washing, rinsing, drying)?: A Lot Help from another person to put on and taking off regular upper body clothing?: A Little Help from another person to put on and taking off regular lower body clothing?: A Lot 6 Click Score: 16    End of Session Equipment Utilized During Treatment: Gait belt;Rolling walker (2 wheels)  OT Visit Diagnosis: Unsteadiness on feet (R26.81);Other abnormalities of gait and mobility (R26.89);Muscle weakness (generalized) (M62.81);Pain   Activity Tolerance Patient limited by pain   Patient Left in bed;with call bell/phone within reach;with family/visitor present   Nurse Communication Mobility status        Time: 4193-7902 OT Time Calculation (min): 26 min  Charges: OT General Charges $OT Visit: 1 Visit OT Treatments $Self Care/Home Management : 8-22 mins $Therapeutic Activity: 8-22 mins  Jacoby Zanni C, OT/L  Acute Rehab [phone]  Lenice Llamas 03/13/2021, 5:41 PM

## 2021-03-14 DIAGNOSIS — K56609 Unspecified intestinal obstruction, unspecified as to partial versus complete obstruction: Secondary | ICD-10-CM | POA: Diagnosis not present

## 2021-03-14 LAB — CBC WITH DIFFERENTIAL/PLATELET
Abs Immature Granulocytes: 0.37 10*3/uL — ABNORMAL HIGH (ref 0.00–0.07)
Basophils Absolute: 0.1 10*3/uL (ref 0.0–0.1)
Basophils Relative: 0 %
Eosinophils Absolute: 0.5 10*3/uL (ref 0.0–0.5)
Eosinophils Relative: 4 %
HCT: 36.2 % — ABNORMAL LOW (ref 39.0–52.0)
Hemoglobin: 11.5 g/dL — ABNORMAL LOW (ref 13.0–17.0)
Immature Granulocytes: 3 %
Lymphocytes Relative: 16 %
Lymphs Abs: 2 10*3/uL (ref 0.7–4.0)
MCH: 21.8 pg — ABNORMAL LOW (ref 26.0–34.0)
MCHC: 31.8 g/dL (ref 30.0–36.0)
MCV: 68.7 fL — ABNORMAL LOW (ref 80.0–100.0)
Monocytes Absolute: 1.6 10*3/uL — ABNORMAL HIGH (ref 0.1–1.0)
Monocytes Relative: 13 %
Neutro Abs: 8.2 10*3/uL — ABNORMAL HIGH (ref 1.7–7.7)
Neutrophils Relative %: 64 %
Platelets: 433 10*3/uL — ABNORMAL HIGH (ref 150–400)
RBC: 5.27 MIL/uL (ref 4.22–5.81)
RDW: 14.4 % (ref 11.5–15.5)
WBC: 12.7 10*3/uL — ABNORMAL HIGH (ref 4.0–10.5)
nRBC: 0 % (ref 0.0–0.2)

## 2021-03-14 LAB — GLUCOSE, CAPILLARY
Glucose-Capillary: 165 mg/dL — ABNORMAL HIGH (ref 70–99)
Glucose-Capillary: 169 mg/dL — ABNORMAL HIGH (ref 70–99)
Glucose-Capillary: 172 mg/dL — ABNORMAL HIGH (ref 70–99)
Glucose-Capillary: 180 mg/dL — ABNORMAL HIGH (ref 70–99)
Glucose-Capillary: 181 mg/dL — ABNORMAL HIGH (ref 70–99)
Glucose-Capillary: 204 mg/dL — ABNORMAL HIGH (ref 70–99)

## 2021-03-14 LAB — BASIC METABOLIC PANEL
Anion gap: 7 (ref 5–15)
BUN: 16 mg/dL (ref 8–23)
CO2: 24 mmol/L (ref 22–32)
Calcium: 9.1 mg/dL (ref 8.9–10.3)
Chloride: 104 mmol/L (ref 98–111)
Creatinine, Ser: 0.91 mg/dL (ref 0.61–1.24)
GFR, Estimated: >60 mL/min (ref >60)
Glucose, Bld: 160 mg/dL — ABNORMAL HIGH (ref 70–99)
Potassium: 4.5 mmol/L (ref 3.5–5.1)
Sodium: 135 mmol/L (ref 135–145)

## 2021-03-14 MED ORDER — TRAVASOL 10 % IV SOLN
INTRAVENOUS | Status: AC
  Filled 2021-03-14: qty 1252.8

## 2021-03-14 NOTE — Progress Notes (Signed)
PROGRESS NOTE    Daniel Perkins  PZW:[id number] DOB: 1955-06-25 DOA: 03/04/2021 PCP: Ellyn Hack, MD   Brief Narrative: 65 year old with past medical history significant for hypertension, diabetes type 2, CAD, peripheral artery disease, hyperlipidemia, presented with acute onset of abdominal pain and found to have a small bowel obstruction with transition zone on the left side.  Admitted with surgical consultation.  Seen by cardiology for preop clearance. He underwent ex lap, small bowel perforation repair on 10/28.  Post operative, patient with persistent ileus.  Plan to continue with NG tube. CT 11/02 showed ileus, fluid collection. Patient started on IV antibiotics. TPN>    Assessment & Plan:   Active Problems:   Diabetes mellitus due to underlying condition, uncontrolled, with hyperglycemia (HCC)   Hyperlipemia   HYPERTENSION   Gout   SBO (small bowel obstruction) (HCC)   Malnutrition of moderate degree  1-SBO due to adhesion of the small bowel and perforation. -Postoperative persistent ileus, fluid collection.  -Continue with NG tube.  -Started on TPN 11/03. -CT abdomen showed: Moderate length segment small bowel in the right abdomen demonstrating wall thickening and mesenteric edema worrisome for nonspecific enteritis including infectious, inflammatory and ischemic etiologies. Small bowel loops proximal to this level are dilated, but oral contrast is seen distal to this level. Findings are compatible with partial small bowel obstruction. New enhancing fluid collection in the pelvis may represent abscess. -Continue with  IV zosyn.  -NG tube removed 11/06. Started clear diet   2-Diabetes type 2: Continue to hold Victoza and Jardiance at home. Continue with a sliding scale insulin.  3-HTN;  Currently NOP, holding Oral meds.  He was on carvedilol at home, now on  IV metoprolol schedule.  PRN hydralazine PRN.   4-History of CAD: Holding aspirin. Hyperlipidemia:  Holding statins resume when able to take oral AKI: Improved with IV fluids. Feet pain;  uric acid normal. Continue with  voltaren gel.    Estimated body mass index is 30.67 kg/m as calculated from the following:   Height as of this encounter: 5\' 7"  (1.702 m).   Weight as of this encounter: 88.8 kg.   DVT prophylaxis: start heparin  Code Status: Full code Family Communication: Discussed with patient daughter  11/06 Disposition Plan:  Status is: Inpatient  Remains inpatient appropriate because: Persistent ileus post op        Consultants:  General surgery  Procedures:  Diagnostic laparoscopy, lysis of single addition band in the left lower quadrant breast solution of with obstruction, mini laparotomy with primary repair of 2 mm small bowel perforation on 10/28 by Dr. 11/28  Antimicrobials:  None  Subjective: He is alert, started clears. NGT removed.  Passing gas, no BM yet   Objective: Vitals:   03/13/21 1658 03/13/21 2043 03/14/21 0445 03/14/21 0824  BP: 136/83 120/83 (!) 157/86 (!) 147/89  Pulse: 95 88 83 87  Resp: 19 16 17 18   Temp: 98.2 F (36.8 C) 98.5 F (36.9 C) 98.7 F (37.1 C) 98.3 F (36.8 C)  TempSrc: Oral Oral Oral Oral  SpO2: 97% 94% 97% 95%  Weight:      Height:        Intake/Output Summary (Last 24 hours) at 03/14/2021 1313 Last data filed at 03/14/2021 0800 Gross per 24 hour  Intake 1253.42 ml  Output 1675 ml  Net -421.58 ml    Filed Weights   03/04/21 0256 03/11/21 1400  Weight: 94.8 kg 88.8 kg    Examination:  General  exam: NAD Respiratory system: CTA Cardiovascular system: S 1, S 2 RRR Gastrointestinal system: BS present, soft, mild tender.  Incision healing, staples in place.  Central nervous system: Alert, follows command Extremities: No edema    Data Reviewed: I have personally reviewed following labs and imaging studies  CBC: Recent Labs  Lab 03/10/21 0149 03/11/21 0045 03/12/21 0340 03/13/21 0412  03/14/21 0319  WBC 13.5* 12.3* 13.9* 11.8* 12.7*  NEUTROABS 9.6* 7.7 8.9* 7.6 8.2*  HGB 12.2* 11.6* 11.1* 11.4* 11.5*  HCT 37.3* 35.8* 34.6* 35.2* 36.2*  MCV 68.3* 69.0* 69.2* 68.5* 68.7*  PLT 367 396 391 431* 433*    Basic Metabolic Panel: Recent Labs  Lab 03/08/21 0055 03/09/21 0155 03/10/21 0149 03/11/21 0045 03/12/21 0340 03/13/21 0412 03/14/21 0800  NA 133*   < > 133* 134* 136 134* 135  K 3.9   < > 3.6 3.7 3.7 4.2 4.5  CL 102   < > 104 104 104 104 104  CO2 22   < > 19* 22 25 24 24   GLUCOSE 118*   < > 143* 119* 126* 166* 160*  BUN 16   < > 15 15 13 13 16   CREATININE 1.14   < > 0.97 1.01 0.98 0.91 0.91  CALCIUM 8.9   < > 8.8* 8.6* 8.6* 8.8* 9.1  MG 2.5*  --   --  2.2 2.2 2.1  --   PHOS 3.0  --   --  3.8 3.9 4.0  --    < > = values in this interval not displayed.    GFR: Estimated Creatinine Clearance: 87.2 mL/min (by C-G formula based on SCr of 0.91 mg/dL). Liver Function Tests: Recent Labs  Lab 03/12/21 0340  AST 22  ALT 20  ALKPHOS 78  BILITOT 1.0  PROT 6.8  ALBUMIN 2.0*    No results for input(s): LIPASE, AMYLASE in the last 168 hours. No results for input(s): AMMONIA in the last 168 hours. Coagulation Profile: No results for input(s): INR, PROTIME in the last 168 hours. Cardiac Enzymes: No results for input(s): CKTOTAL, CKMB, CKMBINDEX, TROPONINI in the last 168 hours. BNP (last 3 results) No results for input(s): PROBNP in the last 8760 hours. HbA1C: No results for input(s): HGBA1C in the last 72 hours. CBG: Recent Labs  Lab 03/13/21 2039 03/13/21 2333 03/14/21 0447 03/14/21 0826 03/14/21 1228  GLUCAP 147* 156* 204* 172* 180*    Lipid Profile: Recent Labs    03/12/21 0340  TRIG 138    Thyroid Function Tests: No results for input(s): TSH, T4TOTAL, FREET4, T3FREE, THYROIDAB in the last 72 hours. Anemia Panel: No results for input(s): VITAMINB12, FOLATE, FERRITIN, TIBC, IRON, RETICCTPCT in the last 72 hours. Sepsis Labs: No results  for input(s): PROCALCITON, LATICACIDVEN in the last 168 hours.   Recent Results (from the past 240 hour(s))  Surgical pcr screen     Status: None   Collection Time: 03/05/21  2:58 AM   Specimen: Nasal Mucosa; Nasal Swab  Result Value Ref Range Status   MRSA, PCR NEGATIVE NEGATIVE Final   Staphylococcus aureus NEGATIVE NEGATIVE Final    Comment: (NOTE) The Xpert SA Assay (FDA approved for NASAL specimens in patients 30 years of age and older), is one component of a comprehensive surveillance program. It is not intended to diagnose infection nor to guide or monitor treatment. Performed at [facility], [address], [place], [address] [place]           Radiology  Studies: No results found.      Scheduled Meds:  Chlorhexidine Gluconate Cloth  6 each Topical Daily   diclofenac Sodium  2 g Topical QID   heparin injection (subcutaneous)  5,000 Units Subcutaneous Q8H   insulin aspart  0-20 Units Subcutaneous Q4H   lidocaine  1 patch Transdermal Q24H   metoprolol tartrate  2.5 mg Intravenous Q12H   pantoprazole (PROTONIX) IV  40 mg Intravenous Q12H   sodium chloride flush  10-40 mL Intracatheter Q12H   Continuous Infusions:  methocarbamol (ROBAXIN) IV     piperacillin-tazobactam (ZOSYN)  IV 3.375 g (03/14/21 0819)   TPN ADULT (ION) 90 mL/hr at 03/13/21 1717   TPN ADULT (ION)       LOS: 10 days    Time spent: 35 minutes    Farouk Vivero A Yesha Muchow, MD [facility]   If 7PM-7AM, please contact night-coverage [email or web address]  03/14/2021, 1:13 PM

## 2021-03-14 NOTE — Progress Notes (Signed)
NGT discontinued as ordered, clear liquids ordered

## 2021-03-14 NOTE — Progress Notes (Signed)
PHARMACY - TOTAL PARENTERAL NUTRITION CONSULT NOTE  Indication: Prolonged ileus  Patient Measurements: Height: 5\' 7"  (170.2 cm) Weight: 88.8 kg (195 lb 12.8 oz) IBW/kg (Calculated) : 66.1 TPN AdjBW (KG): 71.8 Body mass index is 30.67 kg/m.  Assessment:  11 YOM presented on 10/27 with acute abdominal pain, nausea and vomiting after dinner.  CT showed SBO and patient underwent ex-lap with lysis of single LLQ adhesion and repair of small bowel perforation on 10/28.  NG tube clamped and clears started 11/1.  Subsequently had nausea and lower abdominal pain, so NG tube switched back to LIWS.  Repeat CT on 11/2 showed nonspecific thickened and dilated small bowel, possible ileus or pSBO; small pelvic fluid collection.  Patient has inadequate nutrition for 7 days, so Pharmacy consulted for TPN management.  Patient reports he typically eats 3 meals a day.  He loves steak and chicken; eats canned vegetables and fruits.  He weighs 237 lbs earlier in the year, lost ~30 lbs over the summer.  Glucose / Insulin: DM on glipizide, Victoza and Jardiance PTA, A1c 6%.   CBGs  - used 25 units SSI in past 24 hrs Electrolytes: No new labs 11/6. Na 134, K 4.2 (goal >/= 4), others WNL Renal: SCr 0.91, BUN WNL  Hepatic: LFTs / tbili WNL; Tgs 138  Intake / Output; MIVF: UOP 0.6 ml/kg/hr, NG 13/6, LBM PTA 10/26. GI Imaging: none since TPN GI Surgeries / Procedures: none since TPN  Central access: PICC placed 03/11/21 TPN start date: 03/11/21  Nutritional Goals: RD assessment 11/3: Estimated Needs Total Energy Estimated Needs: 2100-2300 kcals Total Protein Estimated Needs: 120-135 g Total Fluid Estimated Needs: >/= 2 L  Current Nutrition:  NPO  Plan:  Continue TPN at 90 mL/hr (goal rate) Electrolytes in TPN: Na 125 mEq/L, K 57mEq/L, Ca 3mEq/L, Mg 28mEq/L, Phos 21mmol/L. Cl:Ac 1:1 Add standard MVI and trace elements to TPN Change resistant SSI to Q4H.  Empirically increase to 25 units regular insulin to  TPN. Standard TPN labs and nursing care orders   12m, PharmD, [facility] Clinical Pharmacist Please see AMION for all Pharmacists' Contact Phone Numbers 03/14/2021, 6:58 AM

## 2021-03-14 NOTE — Progress Notes (Signed)
Mobility Specialist Progress Note   03/14/21 1100  Mobility  Activity Transferred:  Bed to chair;Stood at bedside  Level of Assistance Moderate assist, patient does 50-74%  Assistive Device Front wheel walker  Distance Ambulated (ft) 2 ft  Mobility Out of bed for toileting;Sit up in bed/chair position for meals  Mobility Response Tolerated fair  Mobility performed by Mobility specialist  Bed Position Chair  $Mobility charge 1 Mobility   Received pt laying in bed in an uncomfortable position wanting to sit in chair for bath. Bed mobility and STS modA due inc'd sensitivity and pain in bottom of both feet. Assisted NT w/ bath in chair, placed call bell by side and all needs met.  Holland Falling Mobility Specialist Phone Number [phone]

## 2021-03-14 NOTE — Progress Notes (Signed)
Patient ID: Daniel Perkins, male   DOB: [birth year], 65 y.o.   MRN: [id number] [facility] Progress Note:   9 Days Post-Op  Subjective: Mental status is alert.  Complaints none except the NG tube. Objective: Vital signs in last 24 hours: Temp:  [98.2 F (36.8 C)-98.7 F (37.1 C)] 98.7 F (37.1 C) (11/06 0445) Pulse Rate:  [83-95] 83 (11/06 0445) Resp:  [16-19] 17 (11/06 0445) BP: (120-157)/(83-86) 157/86 (11/06 0445) SpO2:  [94 %-97 %] 97 % (11/06 0445)  Intake/Output from previous day: 11/05 0701 - 11/06 0700 In: 1323.4 [P.O.:60; I.V.:1143.4; IV Piggyback:120] Out: [id number] [Urine:1375; Emesis/NG output:450] Intake/Output this shift: Total I/O In: -  Out: 350 [Urine:350]  Physical Exam: Work of breathing is normal.  He has passed gas overnight.    Lab Results:  Results for orders placed or performed during the hospital encounter of 03/04/21 (from the past 48 hour(s))  Glucose, capillary     Status: Abnormal   Collection Time: 03/12/21 12:57 PM  Result Value Ref Range   Glucose-Capillary 159 (H) 70 - 99 mg/dL    Comment: Glucose reference range applies only to samples taken after fasting for at least 8 hours.  Glucose, capillary     Status: Abnormal   Collection Time: 03/12/21  5:31 PM  Result Value Ref Range   Glucose-Capillary 145 (H) 70 - 99 mg/dL    Comment: Glucose reference range applies only to samples taken after fasting for at least 8 hours.  Glucose, capillary     Status: Abnormal   Collection Time: 03/12/21  7:42 PM  Result Value Ref Range   Glucose-Capillary 181 (H) 70 - 99 mg/dL    Comment: Glucose reference range applies only to samples taken after fasting for at least 8 hours.  Glucose, capillary     Status: Abnormal   Collection Time: 03/12/21 11:51 PM  Result Value Ref Range   Glucose-Capillary 149 (H) 70 - 99 mg/dL    Comment: Glucose reference range applies only to samples taken after fasting for at least 8 hours.  Glucose, capillary      Status: Abnormal   Collection Time: 03/13/21  4:01 AM  Result Value Ref Range   Glucose-Capillary 159 (H) 70 - 99 mg/dL    Comment: Glucose reference range applies only to samples taken after fasting for at least 8 hours.  CBC with Differential/Platelet     Status: Abnormal   Collection Time: 03/13/21  4:12 AM  Result Value Ref Range   WBC 11.8 (H) 4.0 - 10.5 K/uL   RBC 5.14 4.22 - 5.81 MIL/uL   Hemoglobin 11.4 (L) 13.0 - 17.0 g/dL   HCT 35.2 (L) 39.0 - 52.0 %   MCV 68.5 (L) 80.0 - 100.0 fL   MCH 22.2 (L) 26.0 - 34.0 pg   MCHC 32.4 30.0 - 36.0 g/dL   RDW 14.3 11.5 - 15.5 %   Platelets 431 (H) 150 - 400 K/uL   nRBC 0.0 0.0 - 0.2 %   Neutrophils Relative % 65 %   Neutro Abs 7.6 1.7 - 7.7 K/uL   Lymphocytes Relative 15 %   Lymphs Abs 1.8 0.7 - 4.0 K/uL   Monocytes Relative 13 %   Monocytes Absolute 1.6 (H) 0.1 - 1.0 K/uL   Eosinophils Relative 3 %   Eosinophils Absolute 0.4 0.0 - 0.5 K/uL   Basophils Relative 0 %   Basophils Absolute 0.0 0.0 - 0.1 K/uL   Immature Granulocytes 4 %   Abs  Immature Granulocytes 0.50 (H) 0.00 - 0.07 K/uL    Comment: Performed at [facility], [address], [place], [place] [address]  Basic metabolic panel     Status: Abnormal   Collection Time: 03/13/21  4:12 AM  Result Value Ref Range   Sodium 134 (L) 135 - 145 mmol/L   Potassium 4.2 3.5 - 5.1 mmol/L   Chloride 104 98 - 111 mmol/L   CO2 24 22 - 32 mmol/L   Glucose, Bld 166 (H) 70 - 99 mg/dL    Comment: Glucose reference range applies only to samples taken after fasting for at least 8 hours.   BUN 13 8 - 23 mg/dL   Creatinine, Ser 3.50 0.61 - 1.24 mg/dL   Calcium 8.8 (L) 8.9 - 10.3 mg/dL   GFR, Estimated >09 >38 mL/min    Comment: (NOTE) Calculated using the CKD-EPI Creatinine Equation (2021)    Anion gap 6 5 - 15    Comment: Performed at [facility], [address], [place], [place] [address]  Magnesium     Status: None   Collection Time: 03/13/21  4:12 AM  Result Value Ref  Range   Magnesium 2.1 1.7 - 2.4 mg/dL    Comment: Performed at [facility], [address], [place], [place] [address]  Phosphorus     Status: None   Collection Time: 03/13/21  4:12 AM  Result Value Ref Range   Phosphorus 4.0 2.5 - 4.6 mg/dL    Comment: Performed at [facility], [address], [place], [place] [address]  Glucose, capillary     Status: Abnormal   Collection Time: 03/13/21  8:16 AM  Result Value Ref Range   Glucose-Capillary 177 (H) 70 - 99 mg/dL    Comment: Glucose reference range applies only to samples taken after fasting for at least 8 hours.  Glucose, capillary     Status: Abnormal   Collection Time: 03/13/21 12:39 PM  Result Value Ref Range   Glucose-Capillary 178 (H) 70 - 99 mg/dL    Comment: Glucose reference range applies only to samples taken after fasting for at least 8 hours.  Glucose, capillary     Status: Abnormal   Collection Time: 03/13/21  5:45 PM  Result Value Ref Range   Glucose-Capillary 154 (H) 70 - 99 mg/dL    Comment: Glucose reference range applies only to samples taken after fasting for at least 8 hours.  Glucose, capillary     Status: Abnormal   Collection Time: 03/13/21  8:39 PM  Result Value Ref Range   Glucose-Capillary 147 (H) 70 - 99 mg/dL    Comment: Glucose reference range applies only to samples taken after fasting for at least 8 hours.  Glucose, capillary     Status: Abnormal   Collection Time: 03/13/21 11:33 PM  Result Value Ref Range   Glucose-Capillary 156 (H) 70 - 99 mg/dL    Comment: Glucose reference range applies only to samples taken after fasting for at least 8 hours.  CBC with Differential/Platelet     Status: Abnormal   Collection Time: 03/14/21  3:19 AM  Result Value Ref Range   WBC 12.7 (H) 4.0 - 10.5 K/uL   RBC 5.27 4.22 - 5.81 MIL/uL   Hemoglobin 11.5 (L) 13.0 - 17.0 g/dL   HCT 81.0 (L) 17.5 - 10.2 %   MCV 68.7 (L) 80.0 - 100.0 fL   MCH 21.8 (L) 26.0 - 34.0 pg  MCHC 31.8 30.0 - 36.0 g/dL   RDW  14.4 11.5 - 15.5 %   Platelets 433 (H) 150 - 400 K/uL    Comment: REPEATED TO VERIFY   nRBC 0.0 0.0 - 0.2 %   Neutrophils Relative % 64 %   Neutro Abs 8.2 (H) 1.7 - 7.7 K/uL   Lymphocytes Relative 16 %   Lymphs Abs 2.0 0.7 - 4.0 K/uL   Monocytes Relative 13 %   Monocytes Absolute 1.6 (H) 0.1 - 1.0 K/uL   Eosinophils Relative 4 %   Eosinophils Absolute 0.5 0.0 - 0.5 K/uL   Basophils Relative 0 %   Basophils Absolute 0.1 0.0 - 0.1 K/uL   Immature Granulocytes 3 %   Abs Immature Granulocytes 0.37 (H) 0.00 - 0.07 K/uL    Comment: Performed at [facility], [address], [place], [place] [address]  Glucose, capillary     Status: Abnormal   Collection Time: 03/14/21  4:47 AM  Result Value Ref Range   Glucose-Capillary 204 (H) 70 - 99 mg/dL    Comment: Glucose reference range applies only to samples taken after fasting for at least 8 hours.    Radiology/Results: No results found.  Anti-infectives: Anti-infectives (From admission, onward)    Start     Dose/Rate Route Frequency Ordered Stop   03/11/21 0900  piperacillin-tazobactam (ZOSYN) IVPB 3.375 g        3.375 g 12.5 mL/hr over 240 Minutes Intravenous Every 8 hours 03/11/21 0815     03/05/21 0845  cefoTEtan (CEFOTAN) 2 g in sodium chloride 0.9 % 100 mL IVPB        2 g 200 mL/hr over 30 Minutes Intravenous To [facility] 03/04/21 2206 03/05/21 1035       Assessment/Plan: Problem List: Patient Active Problem List   Diagnosis Date Noted   Malnutrition of moderate degree 03/11/2021   SBO (small bowel obstruction) ([place]) 03/04/2021   Left hand pain 06/22/2020   Back pain 05/20/2020   Neck pain 08/27/2019   CKD (chronic kidney disease), 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   Hepatic steatosis 05/01/2019   Prostate hypertrophy 12/06/2018   Sleep apnea-like behavior 10/01/2014   Peripheral vascular disease ([place]) 02/10/2014   Microcytic anemia 02/06/2013   CTEV (congenital talipes equinovarus)    Statin intolerance 06/29/2012    Esophageal reflux 06/29/2012   Health care maintenance 06/28/2012   Gout 06/12/2012   Obesity, Class II, BMI 35-39.9 02/20/2012   DOE (dyspnea on exertion) 02/20/2012   Diabetes 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  If this fails then will need repeat CT scan.   9 Days Post-Op    LOS: 10 days   Matt B. Hassell Done, MD, [facility] [phone] to reach the surgeon on call.    03/14/2021 8:19 AM

## 2021-03-15 DIAGNOSIS — K56609 Unspecified intestinal obstruction, unspecified as to partial versus complete obstruction: Secondary | ICD-10-CM | POA: Diagnosis not present

## 2021-03-15 LAB — COMPREHENSIVE METABOLIC PANEL
ALT: 28 U/L (ref 0–44)
AST: 38 U/L (ref 15–41)
Albumin: 2.2 g/dL — ABNORMAL LOW (ref 3.5–5.0)
Alkaline Phosphatase: 81 U/L (ref 38–126)
Anion gap: 7 (ref 5–15)
BUN: 17 mg/dL (ref 8–23)
CO2: 25 mmol/L (ref 22–32)
Calcium: 9.1 mg/dL (ref 8.9–10.3)
Chloride: 101 mmol/L (ref 98–111)
Creatinine, Ser: 0.9 mg/dL (ref 0.61–1.24)
GFR, Estimated: >60 mL/min (ref >60)
Glucose, Bld: 165 mg/dL — ABNORMAL HIGH (ref 70–99)
Potassium: 4.5 mmol/L (ref 3.5–5.1)
Sodium: 133 mmol/L — ABNORMAL LOW (ref 135–145)
Total Bilirubin: 0.6 mg/dL (ref 0.3–1.2)
Total Protein: 7.9 g/dL (ref 6.5–8.1)

## 2021-03-15 LAB — CBC WITH DIFFERENTIAL/PLATELET
Abs Immature Granulocytes: 0.23 10*3/uL — ABNORMAL HIGH (ref 0.00–0.07)
Basophils Absolute: 0 10*3/uL (ref 0.0–0.1)
Basophils Relative: 0 %
Eosinophils Absolute: 0.5 10*3/uL (ref 0.0–0.5)
Eosinophils Relative: 4 %
HCT: 35.1 % — ABNORMAL LOW (ref 39.0–52.0)
Hemoglobin: 11.2 g/dL — ABNORMAL LOW (ref 13.0–17.0)
Immature Granulocytes: 2 %
Lymphocytes Relative: 16 %
Lymphs Abs: 2 10*3/uL (ref 0.7–4.0)
MCH: 22 pg — ABNORMAL LOW (ref 26.0–34.0)
MCHC: 31.9 g/dL (ref 30.0–36.0)
MCV: 68.8 fL — ABNORMAL LOW (ref 80.0–100.0)
Monocytes Absolute: 1.9 10*3/uL — ABNORMAL HIGH (ref 0.1–1.0)
Monocytes Relative: 15 %
Neutro Abs: 7.9 10*3/uL — ABNORMAL HIGH (ref 1.7–7.7)
Neutrophils Relative %: 63 %
Platelets: 452 10*3/uL — ABNORMAL HIGH (ref 150–400)
RBC: 5.1 MIL/uL (ref 4.22–5.81)
RDW: 14.4 % (ref 11.5–15.5)
WBC: 12.4 10*3/uL — ABNORMAL HIGH (ref 4.0–10.5)
nRBC: 0 % (ref 0.0–0.2)

## 2021-03-15 LAB — GLUCOSE, CAPILLARY
Glucose-Capillary: 170 mg/dL — ABNORMAL HIGH (ref 70–99)
Glucose-Capillary: 176 mg/dL — ABNORMAL HIGH (ref 70–99)
Glucose-Capillary: 172 mg/dL — ABNORMAL HIGH (ref 70–99)
Glucose-Capillary: 202 mg/dL — ABNORMAL HIGH (ref 70–99)
Glucose-Capillary: 188 mg/dL — ABNORMAL HIGH (ref 70–99)
Glucose-Capillary: 197 mg/dL — ABNORMAL HIGH (ref 70–99)

## 2021-03-15 LAB — PHOSPHORUS: Phosphorus: 4.2 mg/dL (ref 2.5–4.6)

## 2021-03-15 LAB — TRIGLYCERIDES: Triglycerides: 118 mg/dL (ref <150)

## 2021-03-15 LAB — MAGNESIUM: Magnesium: 2.1 mg/dL (ref 1.7–2.4)

## 2021-03-15 MED ORDER — BOOST / RESOURCE BREEZE PO LIQD CUSTOM
1.0000 | Freq: Three times a day (TID) | ORAL | Status: DC
  Administered 2021-03-15 – 2021-03-16 (×3): 1 via ORAL

## 2021-03-15 MED ORDER — GABAPENTIN 100 MG PO CAPS
100.0000 mg | ORAL_CAPSULE | Freq: Two times a day (BID) | ORAL | Status: DC
  Administered 2021-03-15 – 2021-03-25 (×21): 100 mg via ORAL
  Filled 2021-03-15 (×22): qty 1

## 2021-03-15 MED ORDER — COLCHICINE 0.6 MG PO TABS
0.6000 mg | ORAL_TABLET | Freq: Every day | ORAL | Status: DC
  Administered 2021-03-15 – 2021-03-25 (×11): 0.6 mg via ORAL
  Filled 2021-03-15 (×13): qty 1

## 2021-03-15 MED ORDER — TRAVASOL 10 % IV SOLN
INTRAVENOUS | Status: AC
  Filled 2021-03-15: qty 1252.8

## 2021-03-15 NOTE — Progress Notes (Signed)
PHARMACY - TOTAL PARENTERAL NUTRITION CONSULT NOTE  Indication: Prolonged ileus  Patient Measurements: Height: 5\' 7"  (170.2 cm) Weight: 87.4 kg (192 lb 10.9 oz) (standing) IBW/kg (Calculated) : 66.1 TPN AdjBW (KG): 71.8 Body mass index is 30.18 kg/m.  Assessment:  [name] presented on 10/27 with acute abdominal pain, nausea and vomiting after dinner. CT showed SBO and patient underwent ex-lap with lysis of single LLQ adhesion and repair of small bowel perforation on 10/28.  NG tube clamped and clears started 11/01.  Subsequently had nausea and lower abdominal pain, so NG tube switched back to LIWS.  Repeat CT on 11/02 showed nonspecific thickened and dilated small bowel, possible ileus or SBO with a small pelvic fluid collection. Patient has had inadequate nutrition for 7 days, so Pharmacy was consulted for TPN management.  Patient reported that he typically eats 3 meals a day.  He loves steak and chicken; eats canned vegetables and fruits.  He weighed 237 lbs earlier in the year, lost ~30 lbs over the summer.  Glucose / Insulin: CBGs 165-181, used 24 units SSI in past 24 hrs - DM on glipizide, Victoza and Jardiance PTA (all on hold), A1c 6% Electrolytes: Na 133, K 4.5, CO2/Cl wnl, Phos 4.2, Mg 2.1 Hepatic: LFTs / tbili WNL; Alb 2.2, 11/07 TGs 118  Intake / Output; MIVF: UOP improved to 0.8 ml/kg/hr, NG down to 13/07, LBM PTA 10/25 GI Imaging: none since TPN GI Surgeries / Procedures: none since TPN  Central access: PICC placed 03/11/21 TPN start date: 03/11/21  Nutritional Goals: RD assessment 11/3: Estimated Needs Total Energy Estimated Needs: 2100-2300 kcals Total Protein Estimated Needs: 120-135 g Total Fluid Estimated Needs: >/= 2 L  Current Nutrition:  NPO  Plan:  Continue TPN at 90 mL/hr (goal rate), can consider cycling tomorrow if CBGs improve Electrolytes in TPN: Na 125 mEq/L, K 22mEq/L, Ca 87mEq/L, Mg 30mEq/L, Phos 71mmol/L. Cl:Ac 1:1 Add standard MVI and trace  elements to TPN Continue resistant SSI Q4H in addition to 25 units regular insulin in TPN Standard TPN labs and nursing care orders    Thank you for allowing pharmacy to be a part of this patient's care.  12m, PharmD Clinical Pharmacist

## 2021-03-15 NOTE — Progress Notes (Signed)
  Mobility Specialist Criteria Algorithm Info.  Mobility Team: HOB elevated: Activity: Dangled on edge of bed (LE's exercises) Range of motion: Active; All extremities Level of assistance: Standby assist, set-up cues, supervision of patient - no hands on (Min A to EOB) Assistive device: None Mobility response: Tolerated well Bed Position: Semi-fowlers  Patient received lying supine in bed willing to participate despite complaints of pain in LE's. Complained of 10/10 pain when palpating ankles. Required min A to EOB from supine<>sit. Pt unable to bear weight through LE's due to pain so he agreed to participate in LE exercises. Was able to get himself back into bed without assistance. Tolerated exercises well without complaint or incident and was left lying supine in bed with all needs met and family present.   03/15/2021 2:51 PM

## 2021-03-15 NOTE — Progress Notes (Signed)
Physical Therapy Treatment Patient Details Name: Daniel Perkins MRN: [id number] DOB: 19-Jul-1955 Today's Date: 03/15/2021   History of Present Illness Pt is 65 y/o M admitted to ED for acute abdominal pain on 03/04/21, found to have small bowel obstruction. He is S/p ex lap, small bowel perforation repair on 10/28. PMH includes CAD/MI, gout, CKD, HTN, DM and ACDF.    PT Comments    Pt limited today by abdominal pain (L sided) and bil foot pain (R worse than L). He was able to sit EOB and participate with exercises.  Performed standing with L LE and heavy reliance on UE - unable to take any steps. Pt motivated and trying - just limited by pain.  May benefit from premedication for pain prior to activity for next session.    Recommendations for follow up therapy are one component of a multi-disciplinary discharge planning process, led by the attending physician.  Recommendations may be updated based on patient status, additional functional criteria and insurance authorization.  Follow Up Recommendations  Acute inpatient rehab (3hours/day)     Assistance Recommended at Discharge    Equipment Recommendations  Other (comment) (TBD next venue of care)    Recommendations for Other Services Rehab consult     Precautions / Restrictions Precautions Precautions: Other (comment) Precaution Comments: abdominal precautions Restrictions Other Position/Activity Restrictions: no lifting >10lbs     Mobility  Bed Mobility Overal bed mobility: Needs Assistance Bed Mobility: Rolling;Sidelying to Sit;Sit to Sidelying Rolling: Min assist Sidelying to sit: Mod assist     Sit to sidelying: Mod assist General bed mobility comments: Cues for log roll technique with mod A to lift trunk to sit and for legs back to bed    Transfers Overall transfer level: Needs assistance Equipment used: Rolling walker (2 wheels) Transfers: Sit to/from Stand Sit to Stand: Min assist;From elevated surface            General transfer comment: Bed significantly elevated and pt stood with min A and heavy reliance on UE with increased time to rise.  Pt standing on L LE b/c R foot too painful.    Ambulation/Gait               General Gait Details: Unable to take steps due to pain in R foot and L abdomen   Stairs             Wheelchair Mobility    Modified Rankin (Stroke Patients Only)       Balance Overall balance assessment: Needs assistance Sitting-balance support: No upper extremity supported;Bilateral upper extremity supported Sitting balance-Leahy Scale: Fair Sitting balance - Comments: Can sit without UE support but utilized UE for pain control   Standing balance support: Bilateral upper extremity supported Standing balance-Leahy Scale: Poor Standing balance comment: heavy reliance on BUE support on RW                            Cognition Arousal/Alertness: Awake/alert Behavior During Therapy: WFL for tasks assessed/performed Overall Cognitive Status: Within Functional Limits for tasks assessed                                          Exercises General Exercises - Lower Extremity Ankle Circles/Pumps: AROM;Both;10 reps;Supine (limited motion) Long Arc Quad: AROM;Both;5 reps;Seated (5 second hold) Hip Flexion/Marching: AROM;Both;5 reps;Seated (5 second hold) Other Exercises  Other Exercises: Pt performed 3 bridges during repositioning. Other Exercises: Performed 5 gentle pelvic tilts - tolerated well    General Comments        Pertinent Vitals/Pain Pain Assessment: Faces Faces Pain Scale: Hurts whole lot Pain Location: L Lower abdomen and tops of bilateral feet Pain Descriptors / Indicators: Grimacing;Throbbing;Discomfort;Sharp (increased resp rate) Pain Intervention(s): Limited activity within patient's tolerance;Monitored during session;Repositioned;Patient requesting pain meds-RN notified    Home Living                           Prior Function            PT Goals (current goals can now be found in the care plan section) Progress towards PT goals: Not progressing toward goals - comment (limited by pain today)    Frequency    Min 3X/week      PT Plan Current plan remains appropriate    Co-evaluation              AM-PAC PT "6 Clicks" Mobility   Outcome Measure  Help needed turning from your back to your side while in a flat bed without using bedrails?: A Lot Help needed moving from lying on your back to sitting on the side of a flat bed without using bedrails?: A Lot Help needed moving to and from a bed to a chair (including a wheelchair)?: A Lot Help needed standing up from a chair using your arms (e.g., wheelchair or bedside chair)?: A Lot Help needed to walk in hospital room?: A Lot Help needed climbing 3-5 steps with a railing? : A Lot 6 Click Score: 12    End of Session Equipment Utilized During Treatment: Gait belt Activity Tolerance: Patient limited by pain Patient left: in bed;with call bell/phone within reach;with bed alarm set Nurse Communication: Mobility status PT Visit Diagnosis: Unsteadiness on feet (R26.81);Muscle weakness (generalized) (M62.81);Difficulty in walking, not elsewhere classified (R26.2);Pain     Time: 1526-1550 PT Time Calculation (min) (ACUTE ONLY): 24 min  Charges:  $Therapeutic Exercise: 8-22 mins $Therapeutic Activity: 8-22 mins                     Daniel Perkins, PT Acute Rehab Services Pager [phone] [facility] [phone]    Daniel Perkins 03/15/2021, 4:01 PM

## 2021-03-15 NOTE — Progress Notes (Signed)
PROGRESS NOTE    Daniel Perkins  GEX:[id number] DOB: [birth year] DOA: 03/04/2021 PCP: Ellyn Hack, MD   Brief Narrative: 65 year old with past medical history significant for hypertension, diabetes type 2, CAD, peripheral artery disease, hyperlipidemia, presented with acute onset of abdominal pain and found to have a small bowel obstruction with transition zone on the left side.  Admitted with surgical consultation.  Seen by cardiology for preop clearance. He underwent ex lap, small bowel perforation repair on 10/28.  Post operative, patient with persistent ileus.  Plan to continue with NG tube. CT 11/02 showed ileus, fluid collection. Patient started on IV antibiotics. TPN>    Assessment & Plan:   Active Problems:   Diabetes mellitus due to underlying condition, uncontrolled, with hyperglycemia (HCC)   Hyperlipemia   HYPERTENSION   Gout   SBO (small bowel obstruction) (HCC)   Malnutrition of moderate degree  1-SBO due to adhesion of the small bowel and perforation. -Postoperative persistent ileus, fluid collection.  -Started on TPN 11/03. -CT abdomen showed: Moderate length segment small bowel in the right abdomen demonstrating wall thickening and mesenteric edema worrisome for nonspecific enteritis including infectious, inflammatory and ischemic etiologies. Small bowel loops proximal to this level are dilated, but oral contrast is seen distal to this level. Findings are compatible with partial small bowel obstruction. New enhancing fluid collection in the pelvis may represent abscess. -Continue with  IV zosyn.  -NG tube removed 11/06. Started clear diet  Plan to continue with clears.  WBC stable at 12.   2-Diabetes type 2: Continue to hold Victoza and Jardiance at home. Continue with a sliding scale insulin.  3-HTN;  Currently NOP, holding Oral meds.  He was on carvedilol at home, now on  IV metoprolol schedule.  PRN hydralazine PRN.   4-Feet pain;  uric acid  normal. Continue with  voltaren gel.  He does have history of gout. Will give trial colchicine, will also start gabapentin.    History of CAD: Holding aspirin. Hyperlipidemia: Holding statins resume when able to take oral AKI: Improved with IV fluids.   Estimated body mass index is 30.18 kg/m as calculated from the following:   Height as of this encounter: 5\' 7"  (1.702 m).   Weight as of this encounter: 87.4 kg.   DVT prophylaxis: start heparin  Code Status: Full code Family Communication: Discussed with patient daughter  11/06 Disposition Plan:  Status is: Inpatient  Remains inpatient appropriate because: Persistent ileus post op        Consultants:  General surgery  Procedures:  Diagnostic laparoscopy, lysis of single addition band in the left lower quadrant breast solution of with obstruction, mini laparotomy with primary repair of 2 mm small bowel perforation on 10/28 by Dr. 11/28  Antimicrobials:  None  Subjective: Denies worsening abdominal pain, nausea. Tolerating clears. He report pain left side abdomen more than right,  No BM yet.  Complaining still of feet pain. He has history of gout.   Objective: Vitals:   03/14/21 1625 03/14/21 1943 03/15/21 0411 03/15/21 0800  BP: 131/85 136/79 (!) 145/93 (!) 142/90  Pulse: 92 88 84 88  Resp: 19 18 17 18   Temp: 98.4 F (36.9 C) 98.9 F (37.2 C) 98.4 F (36.9 C) 98 F (36.7 C)  TempSrc: Oral Oral Oral Oral  SpO2: 95% 97% 92% 97%  Weight:   87.4 kg   Height:        Intake/Output Summary (Last 24 hours) at 03/15/2021 0900 Last data filed  at 03/15/2021 0807 Gross per 24 hour  Intake 1234.45 ml  Output 1600 ml  Net -365.55 ml    Filed Weights   03/04/21 0256 03/11/21 1400 03/15/21 0411  Weight: 94.8 kg 88.8 kg 87.4 kg    Examination:  General exam: NAD Respiratory system: CTA Cardiovascular system: S 1, S 2 RRR Gastrointestinal system: BS decreased, mild tender, no rigidity.  Incision healing,  staples in place.  Central nervous system: alert,. Follows command Extremities: No edema    Data Reviewed: I have personally reviewed following labs and imaging studies  CBC: Recent Labs  Lab 03/11/21 0045 03/12/21 0340 03/13/21 0412 03/14/21 0319 03/15/21 0337  WBC 12.3* 13.9* 11.8* 12.7* 12.4*  NEUTROABS 7.7 8.9* 7.6 8.2* 7.9*  HGB 11.6* 11.1* 11.4* 11.5* 11.2*  HCT 35.8* 34.6* 35.2* 36.2* 35.1*  MCV 69.0* 69.2* 68.5* 68.7* 68.8*  PLT 396 391 431* 433* 452*    Basic Metabolic Panel: Recent Labs  Lab 03/11/21 0045 03/12/21 0340 03/13/21 0412 03/14/21 0800 03/15/21 0337  NA 134* 136 134* 135 133*  K 3.7 3.7 4.2 4.5 4.5  CL 104 104 104 104 101  CO2 22 25 24 24 25   GLUCOSE 119* 126* 166* 160* 165*  BUN 15 13 13 16 17   CREATININE 1.01 0.98 0.91 0.91 0.90  CALCIUM 8.6* 8.6* 8.8* 9.1 9.1  MG 2.2 2.2 2.1  --  2.1  PHOS 3.8 3.9 4.0  --  4.2    GFR: Estimated Creatinine Clearance: 87.5 mL/min (by C-G formula based on SCr of 0.9 mg/dL). Liver Function Tests: Recent Labs  Lab 03/12/21 0340 03/15/21 0337  AST 22 38  ALT 20 28  ALKPHOS 78 81  BILITOT 1.0 0.6  PROT 6.8 7.9  ALBUMIN 2.0* 2.2*    No results for input(s): LIPASE, AMYLASE in the last 168 hours. No results for input(s): AMMONIA in the last 168 hours. Coagulation Profile: No results for input(s): INR, PROTIME in the last 168 hours. Cardiac Enzymes: No results for input(s): CKTOTAL, CKMB, CKMBINDEX, TROPONINI in the last 168 hours. BNP (last 3 results) No results for input(s): PROBNP in the last 8760 hours. HbA1C: No results for input(s): HGBA1C in the last 72 hours. CBG: Recent Labs  Lab 03/14/21 1636 03/14/21 1944 03/14/21 2354 03/15/21 0422 03/15/21 0805  GLUCAP 181* 165* 169* 170* 176*    Lipid Profile: Recent Labs    03/15/21 0337  TRIG 118    Thyroid Function Tests: No results for input(s): TSH, T4TOTAL, FREET4, T3FREE, THYROIDAB in the last 72 hours. Anemia Panel: No  results for input(s): VITAMINB12, FOLATE, FERRITIN, TIBC, IRON, RETICCTPCT in the last 72 hours. Sepsis Labs: No results for input(s): PROCALCITON, LATICACIDVEN in the last 168 hours.   No results found for this or any previous visit (from the past 240 hour(s)).         Radiology Studies: No results found.      Scheduled Meds:  Chlorhexidine Gluconate Cloth  6 each Topical Daily   colchicine  0.6 mg Oral Daily   diclofenac Sodium  2 g Topical QID   gabapentin  100 mg Oral BID   heparin injection (subcutaneous)  5,000 Units Subcutaneous Q8H   insulin aspart  0-20 Units Subcutaneous Q4H   lidocaine  1 patch Transdermal Q24H   metoprolol tartrate  2.5 mg Intravenous Q12H   pantoprazole (PROTONIX) IV  40 mg Intravenous Q12H   sodium chloride flush  10-40 mL Intracatheter Q12H   Continuous Infusions:  methocarbamol (ROBAXIN)  IV     piperacillin-tazobactam (ZOSYN)  IV 3.375 g (03/15/21 0815)   TPN ADULT (ION) 90 mL/hr at 03/14/21 1735     LOS: 11 days    Time spent: 35 minutes    Stormee Duda A Antoinette Haskett, MD [facility]   If 7PM-7AM, please contact night-coverage [email or web address]  03/15/2021, 9:00 AM

## 2021-03-15 NOTE — Telephone Encounter (Signed)
Patient was seen by cardiology during hospital admission.

## 2021-03-15 NOTE — Progress Notes (Signed)
Pt slept in brief intervals throughout the night. Medicated x2 with prn dilaudid for c/o pain to surgical site on abdomen, increasingly painful with activity. Pt passing gas, however no bowel movement noted so far this shift.

## 2021-03-15 NOTE — Progress Notes (Signed)
Patient ID: Daniel Perkins, male   DOB: [birth year], 65 y.o.   MRN: [id number] [facility] Surgery Progress Note  10 Days Post-Op  Subjective: Tolerating clears without nausea, emesis or increased abdominal pain. Passing flatus. No BM. He is having pain in his feet which limits ambulation  Objective: Vital signs in last 24 hours: Temp:  [98 F (36.7 C)-98.9 F (37.2 C)] 98 F (36.7 C) (11/07 0800) Pulse Rate:  [84-92] 88 (11/07 0800) Resp:  [17-19] 18 (11/07 0800) BP: (131-145)/(79-93) 142/90 (11/07 0800) SpO2:  [92 %-97 %] 97 % (11/07 0800) Weight:  [87.4 kg] 87.4 kg (11/07 0411) Last BM Date: 03/02/21  Intake/Output from previous day: 11/06 0701 - 11/07 0700 In: 1234.5 [P.O.:120; I.V.:972.6; IV Piggyback:141.9] Out: 1750 [Urine:1750] Intake/Output this shift: Total I/O In: -  Out: 200 [Urine:200]  PE: Gen:  Alert, NAD, pleasant Card:  RRR Pulm: CTAB, normal effort on room air Abd: soft, no distension, +BS, no TTP this am, lap incisions and midline cdi with staples present and no erythema or drainage MSK: no calf edema or TTP bilaterally Skin: warm and dry  Lab Results:  Recent Labs    03/14/21 0319 03/15/21 0337  WBC 12.7* 12.4*  HGB 11.5* 11.2*  HCT 36.2* 35.1*  PLT 433* 452*    BMET Recent Labs    03/14/21 0800 03/15/21 0337  NA 135 133*  K 4.5 4.5  CL 104 101  CO2 24 25  GLUCOSE 160* 165*  BUN 16 17  CREATININE 0.91 0.90  CALCIUM 9.1 9.1    PT/INR No results for input(s): LABPROT, INR in the last 72 hours. CMP     Component Value Date/Time   NA 133 (L) 03/15/2021 0337   NA 137 02/10/2021 1538   K 4.5 03/15/2021 0337   CL 101 03/15/2021 0337   CO2 25 03/15/2021 0337   GLUCOSE 165 (H) 03/15/2021 0337   BUN 17 03/15/2021 0337   BUN 13 02/10/2021 1538   CREATININE 0.90 03/15/2021 0337   CREATININE 1.22 06/30/2014 1634   CALCIUM 9.1 03/15/2021 0337   PROT 7.9 03/15/2021 0337   PROT 7.2 12/06/2018 1648   ALBUMIN 2.2 (L) 03/15/2021  0337   ALBUMIN 4.2 12/06/2018 1648   AST 38 03/15/2021 0337   ALT 28 03/15/2021 0337   ALKPHOS 81 03/15/2021 0337   BILITOT 0.6 03/15/2021 0337   BILITOT 0.3 12/06/2018 1648   GFRNONAA >60 03/15/2021 0337   GFRNONAA 78 10/23/2013 1554   GFRAA 86 06/09/2020 1508   GFRAA >89 10/23/2013 1554   Lipase  No results found for: LIPASE     Studies/Results: No results found.  Anti-infectives: Anti-infectives (From admission, onward)    Start     Dose/Rate Route Frequency Ordered Stop   03/11/21 0900  piperacillin-tazobactam (ZOSYN) IVPB 3.375 g        3.375 g 12.5 mL/hr over 240 Minutes Intravenous Every 8 hours 03/11/21 0815     03/05/21 0845  cefoTEtan (CEFOTAN) 2 g in sodium chloride 0.9 % 100 mL IVPB        2 g 200 mL/hr over 30 Minutes Intravenous To [facility] Surgical 03/04/21 2206 03/05/21 1035        Assessment/Plan SBO with no previous abdominal surgical history  POD#10 S/P diagnostic laparoscopy, lysis of single adhesive band in the left lower quadrant with resolution of bowel obstruction, mini laparotomy with primary repair of 2 mm small bowel perforation on 03/05/21 Dr. Fredricka Bonine - CT scan 11/2 with some nonspecific  thickened and dilated small bowel, possible ileus or partial SBO; small pelvic fluid collection - Fluid collection on CT is small and not amenable to drainage. Continue zosyn - continue clears today. Continue TPN - Mobilize. Working with PT/OT who are currently recommending CIR. Ambulation limited by foot pain - WBC 12.4 (12.7), afebrile. So far tolerating clears well. Consider CT if he does not continue to progress   ID - cefotetan periop 10/28, zosyn 11/3>> VTE - SCDs, sq heparin FEN - IVF, clears, breeze, TPN Foley - none   DM HTN CAD h/o MI several years ago - followed by Dr. Eldridge Dace PAD HLD Chronic neck pain   LOS: 11 days    Eric Form, [facility] Surgery 03/15/2021, 10:01 AM Please see Amion for pager number during day  hours 7:00am-4:30pm

## 2021-03-15 NOTE — Progress Notes (Signed)
Inpatient Rehab Admissions Coordinator:   Met with patient at bedside and spoke to daughter over the phone.  I reviewed recommendations for CIR, 3 hrs/day of therapy, estimated length of stay to be about 7-10 days, with goals of intermittent supervision.  Family on board and report they can provide intermittent supervision.  PT planning to see pt shortly and we can start insurance authorization request once they've had a chance to document.  I did let pt/family know that I do not expect [facility] Medicare to authorize CIR easily.  I will continue to follow.   Shann Medal, PT, DPT Admissions Coordinator [phone] 03/15/21  3:20 PM

## 2021-03-15 NOTE — Progress Notes (Signed)
Occupational Therapy Treatment Patient Details Name: Daniel Perkins MRN: [id number] DOB: [birth year] Today's Date: 03/15/2021   History of present illness 65 y/o M admitted to ED for acute abdominal pain, found to have small bowel obstruction. S/p ex lap, small bowel perforation repair on 10/28. PMH includes CAD/MI, gout, CKD, HTN, DM and ACDF.   OT comments  Pt in bed upon arrival, despite continued foot and abdominal pain, pt agreeable to therapy. Pt min guard for bed mobility with increased time, able to perform grooming and toileting with Set up A while seated EOB. Pt actively asking for exercises to do while in bed/chair, educated pt on ankle dorsiflexion/plantarflexion and moving toes. Pt  verbalizes and demonstrates understanding. Pt perform STS transfer and takes R lateral steps Min A toward HOB with RW. Pt continues to participate well during sessions despite pain, motivated to return to PLOF. Will continue to follow for acute OT needs related to ADLs and functional mobility. Pt still remains a good CIR candidate.   Recommendations for follow up therapy are one component of a multi-disciplinary discharge planning process, led by the attending physician.  Recommendations may be updated based on patient status, additional functional criteria and insurance authorization.    Follow Up Recommendations  Acute inpatient rehab (3hours/day)    Assistance Recommended at Discharge Intermittent Supervision/Assistance  Equipment Recommendations  None recommended by OT    Recommendations for Other Services PT consult    Precautions / Restrictions Precautions Precautions: Other (comment) Precaution Comments: abdominal precautions Restrictions Weight Bearing Restrictions: No Other Position/Activity Restrictions: no lifting >10lbs       Mobility Bed Mobility Overal bed mobility: Needs Assistance Bed Mobility: Supine to Sit;Sit to Supine     Supine to sit: Min guard Sit to supine: Min  guard   General bed mobility comments: needs increased time    Transfers Overall transfer level: Needs assistance Equipment used: Rolling walker (2 wheels) Transfers: Sit to/from Stand Sit to Stand: Min assist;From elevated surface           General transfer comment: took 3-4 R side steps toward [facility] with RW     Balance Overall balance assessment: Needs assistance Sitting-balance support: Bilateral upper extremity supported Sitting balance-Leahy Scale: Fair (holds onto bedrail while seated for support)     Standing balance support: Bilateral upper extremity supported                               ADL either performed or assessed with clinical judgement   ADL Overall ADL's : Needs assistance/impaired Eating/Feeding: Sitting;Set up Eating/Feeding Details (indicate cue type and reason): min A to open personal drinking tumbler to take meds, when upright in bed Grooming: Oral care;Sitting;Set up (sitting EOB)               Lower Body Dressing: Sitting/lateral leans;Bed level;Maximal assistance;Moderate assistance Lower Body Dressing Details (indicate cue type and reason): max A to don socks d/t pain, pt able to reach down to pull up sock, able to push heel into shoes with verbal cuing. Toilet Transfer: Minimal assistance;Cueing for safety;Cueing for sequencing;Rolling walker (2 wheels);Ambulation Toilet Transfer Details (indicate cue type and reason): simulated from STS transfer and lateral stepping to [facility] d/t pain Toileting- Clothing Manipulation and Hygiene: Sitting/lateral lean;Set up Toileting - Clothing Manipulation Details (indicate cue type and reason): able to manage clothing/bedside urinal when toileting sitting EOB       General ADL Comments: Pt concerned  for pain and redness on bilateral tops of feet, also surgical site pain    Extremity/Trunk Assessment Upper Extremity Assessment Upper Extremity Assessment: Overall WFL for tasks assessed    Lower Extremity Assessment Lower Extremity Assessment: Defer to PT evaluation        Vision   Vision Assessment?: No apparent visual deficits   Perception Perception Perception: Within Functional Limits   Praxis Praxis Praxis: Not tested    Cognition Arousal/Alertness: Awake/alert Behavior During Therapy: WFL for tasks assessed/performed Overall Cognitive Status: Within Functional Limits for tasks assessed                                            Exercises Exercises: General Lower Extremity Other Exercises Other Exercises: ankle plantarflexion/dorsiflexion and moving toes while in bed/chair.   Shoulder Instructions       General Comments      Pertinent Vitals/ Pain       Pain Assessment: Faces Pain Score: 6  Faces Pain Scale: Hurts even more Pain Location: L Lower abdomen and tops of bilateral feet Pain Descriptors / Indicators: Aching;Discomfort;Dull;Sore Pain Intervention(s): Limited activity within patient's tolerance;Monitored during session;Repositioned;Utilized relaxation techniques;RN gave pain meds during session  Home Living                                          Prior Functioning/Environment              Frequency  Min 2X/week        Progress Toward Goals  OT Goals(current goals can now be found in the care plan section)  Progress towards OT goals: Progressing toward goals  Acute Rehab OT Goals Patient Stated Goal: reduce pain OT Goal Formulation: With patient Time For Goal Achievement: 03/23/21 Potential to Achieve Goals: Good ADL Goals Pt Will Perform Upper Body Dressing: sitting;with supervision Pt Will Perform Lower Body Dressing: sit to/from stand;with min guard assist Pt Will Transfer to Toilet: regular height toilet;with min guard assist Pt Will Perform Tub/Shower Transfer: with min assist  Plan Discharge plan remains appropriate;Frequency remains appropriate    Co-evaluation         PT goals addressed during session: Mobility/safety with mobility;Strengthening/ROM        AM-PAC OT "6 Clicks" Daily Activity     Outcome Measure   Help from another person eating meals?: None Help from another person taking care of personal grooming?: A Little Help from another person toileting, which includes using toliet, bedpan, or urinal?: A Little Help from another person bathing (including washing, rinsing, drying)?: A Lot Help from another person to put on and taking off regular upper body clothing?: A Little Help from another person to put on and taking off regular lower body clothing?: A Lot 6 Click Score: 17    End of Session Equipment Utilized During Treatment: Gait belt;Rolling walker (2 wheels)  OT Visit Diagnosis: Unsteadiness on feet (R26.81);Other abnormalities of gait and mobility (R26.89);Muscle weakness (generalized) (M62.81);Pain Pain - Right/Left: Left Pain - part of body:  (abdomen)   Activity Tolerance Patient limited by pain;Patient tolerated treatment well;Patient limited by fatigue   Patient Left in bed;with call bell/phone within reach;with bed alarm set   Nurse Communication Mobility status        Time: 0254-2706 OT Time  Calculation (min): 33 min  Charges: OT General Charges $OT Visit: 1 Visit OT Treatments $Self Care/Home Management : 8-22 mins $Therapeutic Activity: 8-22 mins  Alfonzo Beers, OTD, OTR/L Acute Rehab [phone]   Mayer Masker 03/15/2021, 10:24 AM

## 2021-03-16 DIAGNOSIS — K56609 Unspecified intestinal obstruction, unspecified as to partial versus complete obstruction: Secondary | ICD-10-CM | POA: Diagnosis not present

## 2021-03-16 LAB — GLUCOSE, CAPILLARY
Glucose-Capillary: 172 mg/dL — ABNORMAL HIGH (ref 70–99)
Glucose-Capillary: 173 mg/dL — ABNORMAL HIGH (ref 70–99)
Glucose-Capillary: 181 mg/dL — ABNORMAL HIGH (ref 70–99)
Glucose-Capillary: 184 mg/dL — ABNORMAL HIGH (ref 70–99)
Glucose-Capillary: 202 mg/dL — ABNORMAL HIGH (ref 70–99)
Glucose-Capillary: 208 mg/dL — ABNORMAL HIGH (ref 70–99)

## 2021-03-16 LAB — BASIC METABOLIC PANEL
Anion gap: 6 (ref 5–15)
BUN: 17 mg/dL (ref 8–23)
CO2: 25 mmol/L (ref 22–32)
Calcium: 9.3 mg/dL (ref 8.9–10.3)
Chloride: 99 mmol/L (ref 98–111)
Creatinine, Ser: 0.92 mg/dL (ref 0.61–1.24)
GFR, Estimated: >60 mL/min (ref >60)
Glucose, Bld: 218 mg/dL — ABNORMAL HIGH (ref 70–99)
Potassium: 5 mmol/L (ref 3.5–5.1)
Sodium: 130 mmol/L — ABNORMAL LOW (ref 135–145)

## 2021-03-16 LAB — CBC WITH DIFFERENTIAL/PLATELET
Abs Immature Granulocytes: 0.16 10*3/uL — ABNORMAL HIGH (ref 0.00–0.07)
Basophils Absolute: 0 10*3/uL (ref 0.0–0.1)
Basophils Relative: 0 %
Eosinophils Absolute: 0.5 10*3/uL (ref 0.0–0.5)
Eosinophils Relative: 5 %
HCT: 36.7 % — ABNORMAL LOW (ref 39.0–52.0)
Hemoglobin: 11.6 g/dL — ABNORMAL LOW (ref 13.0–17.0)
Immature Granulocytes: 2 %
Lymphocytes Relative: 13 %
Lymphs Abs: 1.4 10*3/uL (ref 0.7–4.0)
MCH: 22.3 pg — ABNORMAL LOW (ref 26.0–34.0)
MCHC: 31.6 g/dL (ref 30.0–36.0)
MCV: 70.4 fL — ABNORMAL LOW (ref 80.0–100.0)
Monocytes Absolute: 1.6 10*3/uL — ABNORMAL HIGH (ref 0.1–1.0)
Monocytes Relative: 15 %
Neutro Abs: 7.2 10*3/uL (ref 1.7–7.7)
Neutrophils Relative %: 65 %
Platelets: 481 10*3/uL — ABNORMAL HIGH (ref 150–400)
RBC: 5.21 MIL/uL (ref 4.22–5.81)
RDW: 14.5 % (ref 11.5–15.5)
WBC: 10.8 10*3/uL — ABNORMAL HIGH (ref 4.0–10.5)
nRBC: 0 % (ref 0.0–0.2)

## 2021-03-16 MED ORDER — ACETAMINOPHEN 325 MG PO TABS
650.0000 mg | ORAL_TABLET | Freq: Four times a day (QID) | ORAL | Status: DC | PRN
  Administered 2021-03-17 – 2021-03-19 (×5): 650 mg via ORAL
  Filled 2021-03-16 (×6): qty 2

## 2021-03-16 MED ORDER — TRAVASOL 10 % IV SOLN
INTRAVENOUS | Status: AC
  Filled 2021-03-16: qty 1252.8

## 2021-03-16 MED ORDER — ENSURE ENLIVE PO LIQD
237.0000 mL | Freq: Two times a day (BID) | ORAL | Status: DC
  Administered 2021-03-16: 237 mL via ORAL
  Filled 2021-03-16: qty 237

## 2021-03-16 MED ORDER — OXYCODONE HCL 5 MG PO TABS
5.0000 mg | ORAL_TABLET | ORAL | Status: DC | PRN
Start: 2021-03-16 — End: 2021-03-25
  Administered 2021-03-16 – 2021-03-24 (×10): 10 mg via ORAL
  Administered 2021-03-24 (×2): 5 mg via ORAL
  Filled 2021-03-16 (×13): qty 2
  Filled 2021-03-16: qty 1
  Filled 2021-03-16: qty 2

## 2021-03-16 NOTE — Progress Notes (Signed)
Physical Therapy Treatment Patient Details Name: Daniel Perkins MRN: [id number] DOB: 11-03-1955 Today's Date: 03/16/2021   History of Present Illness Pt is 65 y/o M admitted to ED for acute abdominal pain on 03/04/21, found to have small bowel obstruction. He is S/p ex lap, small bowel perforation repair on 10/28. PMH includes CAD/MI, gout, CKD, HTN, DM and ACDF.    PT Comments   Pt was seen for review of ROM on ankles with pain noted and loss of DF on esp R foot and ankle.  Pt was discussed between PT and MD for review of change in his pain and mobility, and was found that he has started meds for gout and for neuropathy.  Pt is reporting a reduction in pain and will be continuing with PT for hopefully longer gait distances with next sessions.  Imaging is not needed for now and so will focus on recovery of ROM in LE's, improvement in standing endurance and increasing gait distances.  Follow acute PT goals.  Recommendations for follow up therapy are one component of a multi-disciplinary discharge planning process, led by the attending physician.  Recommendations may be updated based on patient status, additional functional criteria and insurance authorization.  Follow Up Recommendations  Acute inpatient rehab (3hours/day)     Assistance Recommended at Discharge Frequent or constant Supervision/Assistance  Equipment Recommendations  None recommended by PT (defer to CIR)    Recommendations for Other Services Rehab consult     Precautions / Restrictions Precautions Precautions: Other (comment) Precaution Comments: abdominal precautions Restrictions Weight Bearing Restrictions: No Other Position/Activity Restrictions: no lifting >10lbs     Mobility  Bed Mobility Overal bed mobility: Needs Assistance Bed Mobility: Sit to Supine       Sit to supine: Min assist   General bed mobility comments: min assist to lift legs to bed for avoiding painful spots    Transfers Overall  transfer level: Needs assistance Equipment used: Rolling walker (2 wheels) Transfers: Sit to/from Stand Sit to Stand: Min assist;From elevated surface Stand pivot transfers: Min assist         General transfer comment: pt is supported to stand from chair with LLE performing the main lifting    Ambulation/Gait               General Gait Details: steps related to transfers   Stairs             Wheelchair Mobility    Modified Rankin (Stroke Patients Only)       Balance Overall balance assessment: Needs assistance Sitting-balance support: Feet supported Sitting balance-Leahy Scale: Fair     Standing balance support: Bilateral upper extremity supported;During functional activity Standing balance-Leahy Scale: Poor                              Cognition Arousal/Alertness: Awake/alert Behavior During Therapy: WFL for tasks assessed/performed Overall Cognitive Status: Within Functional Limits for tasks assessed                                          Exercises      General Comments General comments (skin integrity, edema, etc.): Pt is having pain on feet that hinders balance but in fact is not particularly weak.  Does have new ROM loss on B DF esp R ankle      Pertinent  Vitals/Pain Pain Assessment: 0-10 Pain Score: 8  Pain Location: foot pain on RLE Pain Descriptors / Indicators: Grimacing;Guarding;Jabbing Pain Intervention(s): Limited activity within patient's tolerance;Monitored during session;Premedicated before session;Repositioned    Home Living                          Prior Function            PT Goals (current goals can now be found in the care plan section) Acute Rehab PT Goals Patient Stated Goal: to be out of pain and home Progress towards PT goals: Progressing toward goals    Frequency    Min 3X/week      PT Plan Current plan remains appropriate    Co-evaluation               AM-PAC PT "6 Clicks" Mobility   Outcome Measure  Help needed turning from your back to your side while in a flat bed without using bedrails?: A Little Help needed moving from lying on your back to sitting on the side of a flat bed without using bedrails?: A Lot Help needed moving to and from a bed to a chair (including a wheelchair)?: A Lot Help needed standing up from a chair using your arms (e.g., wheelchair or bedside chair)?: A Lot Help needed to walk in hospital room?: A Lot Help needed climbing 3-5 steps with a railing? : A Lot 6 Click Score: 13    End of Session Equipment Utilized During Treatment: Gait belt Activity Tolerance: Patient limited by pain Patient left: in bed;with call bell/phone within reach;with bed alarm set Nurse Communication: Mobility status PT Visit Diagnosis: Unsteadiness on feet (R26.81);Muscle weakness (generalized) (M62.81);Difficulty in walking, not elsewhere classified (R26.2);Pain Pain - Right/Left:  (Both) Pain - part of body: Ankle and joints of foot     Time: 9833-8250 PT Time Calculation (min) (ACUTE ONLY): 29 min  Charges:  $Therapeutic Exercise: 8-22 mins $Therapeutic Activity: 23-37 mins        Ivar Drape 03/16/2021, 5:54 PM  Samul Dada, PT PhD Acute Rehab Dept. Number: [facility] [id number] and [facility] [phone]

## 2021-03-16 NOTE — Progress Notes (Signed)
PHARMACY - TOTAL PARENTERAL NUTRITION CONSULT NOTE  Indication: Prolonged ileus  Patient Measurements: Height: 5\' 7"  (170.2 cm) Weight: 87.4 kg (192 lb 10.9 oz) (standing) IBW/kg (Calculated) : 66.1 TPN AdjBW (KG): 71.8 Body mass index is 30.18 kg/m.  Assessment:  66 YO male presented on 10/27 with acute abdominal pain, nausea and vomiting after dinner. CT showed SBO and patient underwent ex-lap with lysis of single LLQ adhesion and repair of small bowel perforation on 10/28.  NG tube clamped and clears started 11/01.  Subsequently had nausea and lower abdominal pain, so NG tube switched back to LIWS.  Repeat CT on 11/02 showed nonspecific thickened and dilated small bowel, possible ileus or SBO with a small pelvic fluid collection. Patient has had inadequate nutrition for 7 days, so Pharmacy was consulted for TPN management.  Patient reported that he typically eats 3 meals a day.  He loves steak and chicken; eats canned vegetables and fruits.  He weighed 237 lbs earlier in the year, lost ~30 lbs over the summer.  Per conversation with patient, he is tolerating clears but still no BM. No N/V reported. Per surgery, his diet will be advanced today. Anticipating electrolytes will increase with broadened diet.  Glucose / Insulin: CBGs 170-202, used 30 units SSI in past 24 hrs - DM on glipizide, Victoza and Jardiance PTA (all on hold), A1c 6% Electrolytes: Na down to 130, K 5.0, CO2/Cl wnl, 11/07 Phos 4.2, 11/07 Mg 2.1 Hepatic: LFTs / tbili WNL; Alb 2.2, 11/07 TGs 118  Intake / Output; MIVF: UOP 0.58 ml/kg/hr, NG removed, LBM PTA 10/25 GI Imaging: none since TPN GI Surgeries / Procedures: none since TPN  Central access: PICC placed 03/11/21 TPN start date: 03/11/21  Nutritional Goals: RD assessment 11/3: Estimated Needs Total Energy Estimated Needs: 2100-2300 kcals Total Protein Estimated Needs: 120-135 g Total Fluid Estimated Needs: >/= 2 L  Current Nutrition:  Full liquids  - Ensure  Enlive x2/day added  Plan:  Continue TPN at 90 mL/hr (goal rate), consider cycling once CBGs improve Electrolytes in TPN: incr Na 130 mEq/L, decr K 40 mEq/L, Ca 64mEq/L, Mg 62mEq/L, Phos 15mmol/L. Cl:Ac 1:1 Add standard MVI and trace elements to TPN Continue resistant SSI Q4H in addition to increasing to 35 units regular insulin in TPN Standard TPN labs and nursing care orders    Thank you for allowing pharmacy to be a part of this patient's care.  12m, PharmD Clinical Pharmacist

## 2021-03-16 NOTE — Progress Notes (Addendum)
Patient ID: Daniel Perkins, male   DOB: 06/13/1955, 65 y.o.   MRN: [id number] [facility] Surgery Progress Note  11 Days Post-Op  Subjective: Continues to tolerate clears and pass flatus. No nausea/emesis or worsening abdominal pain. Feels like he is close to having a BM. No nausea or emesis. Working with therapies but minimally ambulatory due to feet pain. No other complaints  Objective: Vital signs in last 24 hours: Temp:  [98 F (36.7 C)-99.5 F (37.5 C)] 99.1 F (37.3 C) (11/08 0401) Pulse Rate:  [79-92] 87 (11/08 0401) Resp:  [17-18] 17 (11/07 1944) BP: (134-143)/(85-92) 134/85 (11/08 0401) SpO2:  [96 %-97 %] 96 % (11/08 0401) Last BM Date: 03/02/21  Intake/Output from previous day: 11/07 0701 - 11/08 0700 In: 360 [P.O.:360] Out: 1375 [Urine:1375] Intake/Output this shift: No intake/output data recorded.  PE: Gen:  Alert, NAD, pleasant Card:  RRR Pulm: CTAB, normal effort on room air Abd: soft, no distension, +BS, very minimal TTP, lap incisions and midline c/d/i with staples present and no erythema or drainage MSK: no calf edema or TTP bilaterally Skin: warm and dry  Lab Results:  Recent Labs    03/15/21 0337 03/16/21 0416  WBC 12.4* 10.8*  HGB 11.2* 11.6*  HCT 35.1* 36.7*  PLT 452* 481*    BMET Recent Labs    03/14/21 0800 03/15/21 0337  NA 135 133*  K 4.5 4.5  CL 104 101  CO2 24 25  GLUCOSE 160* 165*  BUN 16 17  CREATININE 0.91 0.90  CALCIUM 9.1 9.1    PT/INR No results for input(s): LABPROT, INR in the last 72 hours. CMP     Component Value Date/Time   NA 133 (L) 03/15/2021 0337   NA 137 02/10/2021 1538   K 4.5 03/15/2021 0337   CL 101 03/15/2021 0337   CO2 25 03/15/2021 0337   GLUCOSE 165 (H) 03/15/2021 0337   BUN 17 03/15/2021 0337   BUN 13 02/10/2021 1538   CREATININE 0.90 03/15/2021 0337   CREATININE 1.22 06/30/2014 1634   CALCIUM 9.1 03/15/2021 0337   PROT 7.9 03/15/2021 0337   PROT 7.2 12/06/2018 1648   ALBUMIN 2.2  (L) 03/15/2021 0337   ALBUMIN 4.2 12/06/2018 1648   AST 38 03/15/2021 0337   ALT 28 03/15/2021 0337   ALKPHOS 81 03/15/2021 0337   BILITOT 0.6 03/15/2021 0337   BILITOT 0.3 12/06/2018 1648   GFRNONAA >60 03/15/2021 0337   GFRNONAA 78 10/23/2013 1554   GFRAA 86 06/09/2020 1508   GFRAA >89 10/23/2013 1554   Lipase  No results found for: LIPASE     Studies/Results: No results found.  Anti-infectives: Anti-infectives (From admission, onward)    Start     Dose/Rate Route Frequency Ordered Stop   03/11/21 0900  piperacillin-tazobactam (ZOSYN) IVPB 3.375 g        3.375 g 12.5 mL/hr over 240 Minutes Intravenous Every 8 hours 03/11/21 0815     03/05/21 0845  cefoTEtan (CEFOTAN) 2 g in sodium chloride 0.9 % 100 mL IVPB        2 g 200 mL/hr over 30 Minutes Intravenous To [facility] Surgical 03/04/21 2206 03/05/21 1035        Assessment/Plan SBO with no previous abdominal surgical history  POD#11 S/P diagnostic laparoscopy, lysis of single adhesive band in the left lower quadrant with resolution of bowel obstruction, mini laparotomy with primary repair of 2 mm small bowel perforation on 03/05/21 Dr. Fredricka Bonine - CT scan 11/2 with some nonspecific  thickened and dilated small bowel, possible ileus or partial SBO; small pelvic fluid collection - Fluid collection on CT is small and not amenable to drainage. Continue zosyn - continue clears today. Continue TPN - Mobilize. Working with PT/OT who are currently recommending CIR. Ambulation limited by foot pain - WBC 10.8 (12.4), afebrile. So far tolerating clears well. Consider CT if he does not continue to progress - advance to FLD today and follow PO intake - oral pain regimen added - staples out in next day or so   ID - cefotetan periop 10/28, zosyn 11/3>> VTE - SCDs, sq heparin FEN - IVF, FLD, ensure, TPN Foley - none   DM HTN CAD h/o MI several years ago - followed by Dr. Eldridge Dace PAD HLD Chronic neck pain   LOS: 12 days     Eric Form, [facility] Surgery 03/16/2021, 7:56 AM Please see Amion for pager number during day hours 7:00am-4:30pm

## 2021-03-16 NOTE — Progress Notes (Signed)
PROGRESS NOTE    Daniel Perkins  XVQ:[id number] DOB: 07-03-1955 DOA: 03/04/2021 PCP: Ellyn Hack, MD   Brief Narrative: 65 year old with past medical history significant for hypertension, diabetes type 2, CAD, peripheral artery disease, hyperlipidemia, presented with acute onset of abdominal pain and found to have a small bowel obstruction with transition zone on the left side.  Admitted with surgical consultation.  Seen by cardiology for preop clearance. He underwent ex lap, small bowel perforation repair on 10/28.  Post operative, patient with persistent ileus.  Plan to continue with NG tube. CT 11/02 showed ileus, fluid collection. Patient started on IV antibiotics. TPN>    Assessment & Plan:   Active Problems:   Diabetes mellitus due to underlying condition, uncontrolled, with hyperglycemia (HCC)   Hyperlipemia   HYPERTENSION   Gout   SBO (small bowel obstruction) (HCC)   Malnutrition of moderate degree  1-SBO due to adhesion of the small bowel and perforation. -Postoperative persistent ileus, fluid collection.  -Started on TPN 11/03. -CT abdomen showed: Moderate length segment small bowel in the right abdomen demonstrating wall thickening and mesenteric edema worrisome for nonspecific enteritis including infectious, inflammatory and ischemic etiologies. Small bowel loops proximal to this level are dilated, but oral contrast is seen distal to this level. Findings are compatible with partial small bowel obstruction. New enhancing fluid collection in the pelvis may represent abscess. -Continue with  IV zosyn.  -NG tube removed 11/06.  -Slowly improving. WBC down to 10/  -Plan to advanced diet to full liquid. No BM yet.   2-Diabetes type 2: Continue to hold Victoza and Jardiance.  Continue with a sliding scale insulin.  3-HTN;  Currently NOP, holding Oral meds.  He was on carvedilol at home, now on  IV metoprolol schedule.  PRN hydralazine PRN.   4-Feet pain;  uric  acid normal. Continue with  voltaren gel.  He does have history of gout.  trial colchicine, Started  gabapentin.  Pain improved.   History of CAD: Holding aspirin. Hyperlipidemia: Holding statins resume when able to take oral AKI: Improved with IV fluids.   Estimated body mass index is 30.18 kg/m as calculated from the following:   Height as of this encounter: 5\' 7"  (1.702 m).   Weight as of this encounter: 87.4 kg.   DVT prophylaxis: start heparin  Code Status: Full code Family Communication: Discussed with patient daughter  11/06 Disposition Plan:  Status is: Inpatient  Remains inpatient appropriate because: Persistent ileus post op        Consultants:  General surgery  Procedures:  Diagnostic laparoscopy, lysis of single addition band in the left lower quadrant breast solution of with obstruction, mini laparotomy with primary repair of 2 mm small bowel perforation on 10/28 by Dr. 11/28  Antimicrobials:  None  Subjective: He is alert, denies worsening pain, feels full after drinking broth.  Passing gas. No BM yet.   Objective: Vitals:   03/15/21 1625 03/15/21 1944 03/16/21 0401 03/16/21 0805  BP: (!) 143/92 139/85 134/85 120/82  Pulse: 92 79 87 82  Resp: 18 17  18   Temp: 99.5 F (37.5 C) 99.5 F (37.5 C) 99.1 F (37.3 C) 98.6 F (37 C)  TempSrc: Oral Oral Oral Oral  SpO2: 97% 97% 96% 97%  Weight:      Height:        Intake/Output Summary (Last 24 hours) at 03/16/2021 1346 Last data filed at 03/16/2021 0804 Gross per 24 hour  Intake 120 ml  Output  1375 ml  Net -1255 ml    Filed Weights   03/04/21 0256 03/11/21 1400 03/15/21 0411  Weight: 94.8 kg 88.8 kg 87.4 kg    Examination:  General exam: NAD Respiratory system: CTA Cardiovascular system: S 1, S 2 RRR Gastrointestinal system: BS present, soft, nt  Incision healing, staples in place.  Central nervous system: Alert, follows command Extremities:No edema    Data Reviewed: I have  personally reviewed following labs and imaging studies  CBC: Recent Labs  Lab 03/12/21 0340 03/13/21 0412 03/14/21 0319 03/15/21 0337 03/16/21 0416  WBC 13.9* 11.8* 12.7* 12.4* 10.8*  NEUTROABS 8.9* 7.6 8.2* 7.9* 7.2  HGB 11.1* 11.4* 11.5* 11.2* 11.6*  HCT 34.6* 35.2* 36.2* 35.1* 36.7*  MCV 69.2* 68.5* 68.7* 68.8* 70.4*  PLT 391 431* 433* 452* 481*    Basic Metabolic Panel: Recent Labs  Lab 03/11/21 0045 03/12/21 0340 03/13/21 0412 03/14/21 0800 03/15/21 0337 03/16/21 0802  NA 134* 136 134* 135 133* 130*  K 3.7 3.7 4.2 4.5 4.5 5.0  CL 104 104 104 104 101 99  CO2 22 25 24 24 25 25   GLUCOSE 119* 126* 166* 160* 165* 218*  BUN 15 13 13 16 17 17   CREATININE 1.01 0.98 0.91 0.91 0.90 0.92  CALCIUM 8.6* 8.6* 8.8* 9.1 9.1 9.3  MG 2.2 2.2 2.1  --  2.1  --   PHOS 3.8 3.9 4.0  --  4.2  --     GFR: Estimated Creatinine Clearance: 85.6 mL/min (by C-G formula based on SCr of 0.92 mg/dL). Liver Function Tests: Recent Labs  Lab 03/12/21 0340 03/15/21 0337  AST 22 38  ALT 20 28  ALKPHOS 78 81  BILITOT 1.0 0.6  PROT 6.8 7.9  ALBUMIN 2.0* 2.2*    No results for input(s): LIPASE, AMYLASE in the last 168 hours. No results for input(s): AMMONIA in the last 168 hours. Coagulation Profile: No results for input(s): INR, PROTIME in the last 168 hours. Cardiac Enzymes: No results for input(s): CKTOTAL, CKMB, CKMBINDEX, TROPONINI in the last 168 hours. BNP (last 3 results) No results for input(s): PROBNP in the last 8760 hours. HbA1C: No results for input(s): HGBA1C in the last 72 hours. CBG: Recent Labs  Lab 03/15/21 1945 03/15/21 2316 03/16/21 0357 03/16/21 0806 03/16/21 1211  GLUCAP 188* 197* 202* 208* 172*    Lipid Profile: Recent Labs    03/15/21 0337  TRIG 118    Thyroid Function Tests: No results for input(s): TSH, T4TOTAL, FREET4, T3FREE, THYROIDAB in the last 72 hours. Anemia Panel: No results for input(s): VITAMINB12, FOLATE, FERRITIN, TIBC, IRON,  RETICCTPCT in the last 72 hours. Sepsis Labs: No results for input(s): PROCALCITON, LATICACIDVEN in the last 168 hours.   No results found for this or any previous visit (from the past 240 hour(s)).         Radiology Studies: No results found.      Scheduled Meds:  Chlorhexidine Gluconate Cloth  6 each Topical Daily   colchicine  0.6 mg Oral Daily   diclofenac Sodium  2 g Topical QID   feeding supplement  237 mL Oral BID BM   gabapentin  100 mg Oral BID   heparin injection (subcutaneous)  5,000 Units Subcutaneous Q8H   insulin aspart  0-20 Units Subcutaneous Q4H   lidocaine  1 patch Transdermal Q24H   metoprolol tartrate  2.5 mg Intravenous Q12H   pantoprazole (PROTONIX) IV  40 mg Intravenous Q12H   sodium chloride flush  10-40  mL Intracatheter Q12H   Continuous Infusions:  methocarbamol (ROBAXIN) IV     piperacillin-tazobactam (ZOSYN)  IV 3.375 g (03/16/21 0927)   TPN ADULT (ION) 90 mL/hr at 03/15/21 1720   TPN ADULT (ION)       LOS: 12 days    Time spent: 35 minutes    Hafsah Hendler A Naima Veldhuizen, MD [facility]   If 7PM-7AM, please contact night-coverage [email or web address]  03/16/2021, 1:46 PM

## 2021-03-16 NOTE — Progress Notes (Signed)
Nutrition Follow-up  DOCUMENTATION CODES:   Non-severe (moderate) malnutrition in context of acute illness/injury  INTERVENTION:   Continue Ensure Enlive po BID, each supplement provides 350 kcal and 20 grams of protein  Advance diet as medically appropriate per MD Encourage good PO intake  TPN management per Pharmacy  NUTRITION DIAGNOSIS:   Moderate Malnutrition related to acute illness (Bowel obstruction) as evidenced by mild fat depletion, mild muscle depletion, energy intake < or equal to 50% for > or equal to 5 days. - Ongoing  GOAL:   Patient will meet greater than or equal to 90% of their needs - Progressing  MONITOR:   PO intake, Supplement acceptance, Diet advancement, Skin  REASON FOR ASSESSMENT:   Consult New TPN/TNA  ASSESSMENT:   65 yo male admitted with SBO. PMH includes HTN, HLD, DM, CAD  10/28 Diag Lap, LOA, mini lap with primary repair of 2 mm SB perforation 11/02 CT scan with possible ileus or partial SBP, small pelvic fluid collection 11/03 TPN initiation 11/06 diet advanced to Clear Liquids; NGT removed 11/08 diet advanced to Full Liquids  Pt reports that his appetite is slowly coming back and that he would like solid foods. Pt reports that he has been able to keep foods down.    Discussed with pt that this is a progress and that they slowly advance diet as the pt is able to tolerate the food. Pt reports understanding. Discussed that pt is being transitioned to a full liquid diet and that this will provide him with additional options of items.  Pt still has yet to have a bowel movement, but passing gas. Pt states that he has taken some medications orally.   Medications reviewed and include: SSI 0-20 units q4h, Protonix, IV antibiotics Labs reviewed: Sodium 130, 24 hr BG trends 188-208  Diet Order:   Diet Order             Diet full liquid Room service appropriate? Yes; Fluid consistency: Thin  Diet effective now                    EDUCATION NEEDS:   No education needs have been identified at this time  Skin:  Skin Assessment: Reviewed RN Assessment  Last BM:  03/02/2021  Height:   Ht Readings from Last 1 Encounters:  03/11/21 5\' 7"  (1.702 m)    Weight:   Wt Readings from Last 1 Encounters:  03/15/21 87.4 kg    Ideal Body Weight:  67.3 kg  BMI:  Body mass index is 30.18 kg/m.  Estimated Nutritional Needs:   Kcal:  2100-2300 kcals  Protein:  120-135 g  Fluid:  >/= 2 L    Ketty Bitton BS, PLDN Clinical Dietitian See AMiON for contact information.

## 2021-03-16 NOTE — Progress Notes (Signed)
  Mobility Specialist Criteria Algorithm Info.  Mobility Team: HOB elevated: Activity: Ambulated in room; Transferred:  Chair to bed (LE exercises) Range of motion: Active; All extremities Level of assistance: Minimal assist, patient does 75% or more (+2 sit stand) Assistive device: Front wheel walker Distance ambulated (ft): 3 ft Mobility response: Tolerated well Bed Position: Chair  Patient received in chair willing to participate in mobility. Complained of pain in LE's and still unable to completely bear weight through RLE. Performed multiple LE exercises in chair. Upon completion of exercises pt requested assistance back into bed. Was min A +2 to stand, min A to take minimal steps to recliner chair. Tolerated session well despite complaint of pain. Was left in bed with all needs met and call bell in reach.  03/16/2021 4:23 PM

## 2021-03-17 DIAGNOSIS — E871 Hypo-osmolality and hyponatremia: Secondary | ICD-10-CM

## 2021-03-17 DIAGNOSIS — D509 Iron deficiency anemia, unspecified: Secondary | ICD-10-CM

## 2021-03-17 DIAGNOSIS — R5381 Other malaise: Secondary | ICD-10-CM

## 2021-03-17 DIAGNOSIS — K9189 Other postprocedural complications and disorders of digestive system: Secondary | ICD-10-CM

## 2021-03-17 DIAGNOSIS — M1A9XX0 Chronic gout, unspecified, without tophus (tophi): Secondary | ICD-10-CM | POA: Diagnosis not present

## 2021-03-17 DIAGNOSIS — K56609 Unspecified intestinal obstruction, unspecified as to partial versus complete obstruction: Secondary | ICD-10-CM | POA: Diagnosis not present

## 2021-03-17 DIAGNOSIS — E440 Moderate protein-calorie malnutrition: Secondary | ICD-10-CM

## 2021-03-17 DIAGNOSIS — K567 Ileus, unspecified: Secondary | ICD-10-CM

## 2021-03-17 DIAGNOSIS — E785 Hyperlipidemia, unspecified: Secondary | ICD-10-CM | POA: Diagnosis not present

## 2021-03-17 DIAGNOSIS — D72825 Bandemia: Secondary | ICD-10-CM

## 2021-03-17 LAB — CBC WITH DIFFERENTIAL/PLATELET
Abs Immature Granulocytes: 0.19 10*3/uL — ABNORMAL HIGH (ref 0.00–0.07)
Basophils Absolute: 0.1 10*3/uL (ref 0.0–0.1)
Basophils Relative: 1 %
Eosinophils Absolute: 0.7 10*3/uL — ABNORMAL HIGH (ref 0.0–0.5)
Eosinophils Relative: 6 %
HCT: 36.9 % — ABNORMAL LOW (ref 39.0–52.0)
Hemoglobin: 11.5 g/dL — ABNORMAL LOW (ref 13.0–17.0)
Immature Granulocytes: 2 %
Lymphocytes Relative: 13 %
Lymphs Abs: 1.7 10*3/uL (ref 0.7–4.0)
MCH: 22.1 pg — ABNORMAL LOW (ref 26.0–34.0)
MCHC: 31.2 g/dL (ref 30.0–36.0)
MCV: 70.8 fL — ABNORMAL LOW (ref 80.0–100.0)
Monocytes Absolute: 1.9 10*3/uL — ABNORMAL HIGH (ref 0.1–1.0)
Monocytes Relative: 15 %
Neutro Abs: 8.1 10*3/uL — ABNORMAL HIGH (ref 1.7–7.7)
Neutrophils Relative %: 63 %
Platelets: 497 10*3/uL — ABNORMAL HIGH (ref 150–400)
RBC: 5.21 MIL/uL (ref 4.22–5.81)
RDW: 14.6 % (ref 11.5–15.5)
WBC: 12.6 10*3/uL — ABNORMAL HIGH (ref 4.0–10.5)
nRBC: 0 % (ref 0.0–0.2)

## 2021-03-17 LAB — GLUCOSE, CAPILLARY
Glucose-Capillary: 202 mg/dL — ABNORMAL HIGH (ref 70–99)
Glucose-Capillary: 208 mg/dL — ABNORMAL HIGH (ref 70–99)
Glucose-Capillary: 238 mg/dL — ABNORMAL HIGH (ref 70–99)
Glucose-Capillary: 177 mg/dL — ABNORMAL HIGH (ref 70–99)
Glucose-Capillary: 208 mg/dL — ABNORMAL HIGH (ref 70–99)

## 2021-03-17 LAB — BASIC METABOLIC PANEL
Anion gap: 9 (ref 5–15)
BUN: 19 mg/dL (ref 8–23)
CO2: 24 mmol/L (ref 22–32)
Calcium: 9.3 mg/dL (ref 8.9–10.3)
Chloride: 97 mmol/L — ABNORMAL LOW (ref 98–111)
Creatinine, Ser: 0.88 mg/dL (ref 0.61–1.24)
GFR, Estimated: >60 mL/min (ref >60)
Glucose, Bld: 199 mg/dL — ABNORMAL HIGH (ref 70–99)
Potassium: 4.6 mmol/L (ref 3.5–5.1)
Sodium: 130 mmol/L — ABNORMAL LOW (ref 135–145)

## 2021-03-17 MED ORDER — CARVEDILOL 12.5 MG PO TABS
12.5000 mg | ORAL_TABLET | Freq: Two times a day (BID) | ORAL | Status: DC
  Administered 2021-03-17 – 2021-03-18 (×2): 12.5 mg via ORAL
  Filled 2021-03-17 (×3): qty 1

## 2021-03-17 MED ORDER — INSULIN GLARGINE-YFGN 100 UNIT/ML ~~LOC~~ SOLN
10.0000 [IU] | Freq: Every day | SUBCUTANEOUS | Status: DC
  Filled 2021-03-17 (×2): qty 0.1

## 2021-03-17 MED ORDER — ENSURE ENLIVE PO LIQD
237.0000 mL | Freq: Three times a day (TID) | ORAL | Status: DC
  Administered 2021-03-17 – 2021-03-25 (×23): 237 mL via ORAL
  Filled 2021-03-17: qty 237

## 2021-03-17 MED ORDER — MELATONIN 3 MG PO TABS
6.0000 mg | ORAL_TABLET | Freq: Every day | ORAL | Status: DC
Start: 2021-03-17 — End: 2021-03-25
  Administered 2021-03-17 – 2021-03-24 (×7): 6 mg via ORAL
  Filled 2021-03-17 (×8): qty 2

## 2021-03-17 MED ORDER — TRAVASOL 10 % IV SOLN
INTRAVENOUS | Status: AC
  Filled 2021-03-17: qty 974.4

## 2021-03-17 NOTE — Progress Notes (Signed)
PROGRESS NOTE  Izack Hoogland VOJ:[id number] DOB: 12-Aug-1955   PCP: Ellyn Hack, MD  Patient is from: Home  DOA: 03/04/2021 LOS: 13  Chief complaints:  Chief Complaint  Patient presents with   Abdominal Pain     Brief Narrative / Interim history: 65 year old M with PMH of CAD, PAD, DM-2, HTN and HLD presenting with abdominal pain and found to have SBO for which she underwent ex lap with small bowel perforation repair on 03/05/2021.  Postop course complicated by persistent ileus requiring NG tube followed by TPN.  CT abdomen and pelvis on 03/10/2021 with nonspecific thickened and dilated small bowel, possible ileus/partial SBO and small pelvic fluid collection that was not amenable to drainage.  Patient was started on IV Zosyn.  Currently on FL D, TPN and IV Zosyn.  Therapy recommended CIR.  Subjective: Seen and examined earlier this morning.  No major events overnight of this morning.  No complaints.  He has not had a bowel movement yet.  Reports passing gas.  He was unable to ambulate due to bilateral foot pain that he attributes to gout flareup that has not improved.  He denies chest pain, dyspnea, nausea, vomiting or UTI symptoms.  Objective: Vitals:   03/16/21 1746 03/16/21 2013 03/17/21 0404 03/17/21 0746  BP: 93/62 112/78 (!) 152/87 (!) 147/80  Pulse: 93 88 83 89  Resp: 16 18 19 20   Temp: 98.2 F (36.8 C) 98.4 F (36.9 C) 98.5 F (36.9 C) 98.8 F (37.1 C)  TempSrc: Oral Oral Oral Oral  SpO2: 96% 96% 98% 98%  Weight:      Height:        Intake/Output Summary (Last 24 hours) at 03/17/2021 1248 Last data filed at 03/17/2021 13/01/2021 Gross per 24 hour  Intake 3791.87 ml  Output 1160 ml  Net 2631.87 ml   Filed Weights   03/04/21 0256 03/11/21 1400 03/15/21 0411  Weight: 94.8 kg 88.8 kg 87.4 kg    Examination:  GENERAL: No apparent distress.  Nontoxic. HEENT: MMM.  Vision and hearing grossly intact.  NECK: Supple.  No apparent JVD.  RESP:  No IWOB.  Fair  aeration bilaterally. CVS:  RRR. Heart sounds normal.  ABD/GI/GU: BS+. Abd soft, NTND.  MSK/EXT:  Moves extremities. No apparent deformity. No edema.  SKIN: no apparent skin lesion or wound NEURO: Awake, alert and oriented appropriately.  No apparent focal neuro deficit. PSYCH: Calm. Normal affect.   Procedures:  03/05/2021-diagnostic laparoscopy, lysis of single addition band in LLQ with resolution of SBO, mini laparotomy with primary repair of 2 mm small bowel perforation by Dr. 03/07/2021  Microbiology summarized: COVID-19 and influenza PCR nonreactive. MRSA PCR nonreactive.  Assessment & Plan: SBO due to adhesion s/p laparoscopy, lysis of adhesion and mini laparotomy for small perf repair Persistent postoperative ileus-no BM yet but flatus. Possible enteritis -CT A/P with small bowel wall thickening and mesenteric edema with dilated bowel proximally raising concern for possible enteritis, inflammatory process or ischemic changes, and new enhancing small fluid collection in pelvis -Started TPN and IV Zosyn on 11/3. -General surgery following -On FLD -Mobilize patient.  Controlled NIDDM-2 with hyperglycemia: A1c 6.0% on 10/27.  On Victoza, Jardiance and glipizide alone. Recent Labs  Lab 03/16/21 2018 03/16/21 2343 03/17/21 0410 03/17/21 0748 03/17/21 1210  GLUCAP 173* 181* 202* 208* 238*  -Continue holding home meds -Continue SSI-resistance -Add basal insulin at 10 units daily -History of statin intolerance.   Essential hypertension: BP slightly elevated today -Resume home Coreg. -  Discontinue IV metoprolol -Hold ARB   Bilateral feet pain: has history of gout but uric acid normal.  Seems to have history of neuropathy as well.  Continue with  voltaren gel.  Improved after resuming colchicine and gabapentin. -Continue colchicine and gabapentin   History of CAD: No chest pain or anginal symptoms. -Resume home Coreg and aspirin  Hyperlipidemia: Has history of statin  intolerance. -Could benefit from PCSK9 inhibitors  AKI: Resolved.  Hyponatremia: Dehydration? Na 130 but corrects to 132 for hyperglycemia. -Continue monitoring  Microcytic anemia: H&H relatively stable. Recent Labs    03/08/21 0055 03/09/21 0820 03/10/21 0149 03/11/21 0045 03/12/21 0340 03/13/21 0412 03/14/21 0319 03/15/21 0337 03/16/21 0416 03/17/21 0313  HGB 11.7* 12.6* 12.2* 11.6* 11.1* 11.4* 11.5* 11.2* 11.6* 11.5*  -Check anemia panel -Continue monitoring  Leukocytosis/bandemia: Likely due to #1. -Continue monitoring -Antibiotics as above  Physical deconditioning -Therapy recommended CIR.  Moderate malnutrition Body mass index is 30.18 kg/m. Nutrition Problem: Moderate Malnutrition Etiology: acute illness (Bowel obstruction) Signs/Symptoms: mild fat depletion, mild muscle depletion, energy intake < or equal to 50% for > or equal to 5 days Interventions: Ensure Enlive (each supplement provides 350kcal and 20 grams of protein), TPN   DVT prophylaxis:  heparin injection 5,000 Units Start: 03/12/21 1645 Place and maintain sequential compression device Start: 03/10/21 1036  Code Status: Full code Family Communication: Patient and/or RN. Available if any question.  Level of care: Med-Surg Status is: Inpatient  Remains inpatient appropriate because: Possible enteritis requiring IV antibiotics, persistent postop ileus requiring TPN and safe disposition/CIR       Consultants:  General surgery   Sch Meds:  Scheduled Meds:  Chlorhexidine Gluconate Cloth  6 each Topical Daily   colchicine  0.6 mg Oral Daily   diclofenac Sodium  2 g Topical QID   feeding supplement  237 mL Oral TID BM   gabapentin  100 mg Oral BID   heparin injection (subcutaneous)  5,000 Units Subcutaneous Q8H   insulin aspart  0-20 Units Subcutaneous Q4H   lidocaine  1 patch Transdermal Q24H   metoprolol tartrate  2.5 mg Intravenous Q12H   pantoprazole (PROTONIX) IV  40 mg Intravenous  Q12H   sodium chloride flush  10-40 mL Intracatheter Q12H   Continuous Infusions:  methocarbamol (ROBAXIN) IV     piperacillin-tazobactam (ZOSYN)  IV 3.375 g (03/17/21 0946)   TPN ADULT (ION) 90 mL/hr at 03/16/21 1848   TPN ADULT (ION)     PRN Meds:.acetaminophen, hydrALAZINE, HYDROmorphone (DILAUDID) injection, lip balm, methocarbamol (ROBAXIN) IV, morphine injection, nitroGLYCERIN, ondansetron (ZOFRAN) IV, oxyCODONE, phenol, sodium chloride flush  Antimicrobials: Anti-infectives (From admission, onward)    Start     Dose/Rate Route Frequency Ordered Stop   03/11/21 0900  piperacillin-tazobactam (ZOSYN) IVPB 3.375 g        3.375 g 12.5 mL/hr over 240 Minutes Intravenous Every 8 hours 03/11/21 0815     03/05/21 0845  cefoTEtan (CEFOTAN) 2 g in sodium chloride 0.9 % 100 mL IVPB        2 g 200 mL/hr over 30 Minutes Intravenous To [facility] Surgical 03/04/21 2206 03/05/21 1035        I have personally reviewed the following labs and images: CBC: Recent Labs  Lab 03/13/21 0412 03/14/21 0319 03/15/21 0337 03/16/21 0416 03/17/21 0313  WBC 11.8* 12.7* 12.4* 10.8* 12.6*  NEUTROABS 7.6 8.2* 7.9* 7.2 8.1*  HGB 11.4* 11.5* 11.2* 11.6* 11.5*  HCT 35.2* 36.2* 35.1* 36.7* 36.9*  MCV 68.5* 68.7*  68.8* 70.4* 70.8*  PLT 431* 433* 452* 481* 497*   BMP &GFR Recent Labs  Lab 03/11/21 0045 03/12/21 0340 03/13/21 0412 03/14/21 0800 03/15/21 0337 03/16/21 0802 03/17/21 0313  NA 134* 136 134* 135 133* 130* 130*  K 3.7 3.7 4.2 4.5 4.5 5.0 4.6  CL 104 104 104 104 101 99 97*  CO2 22 25 24 24 25 25 24   GLUCOSE 119* 126* 166* 160* 165* 218* 199*  BUN 15 13 13 16 17 17 19   CREATININE 1.01 0.98 0.91 0.91 0.90 0.92 0.88  CALCIUM 8.6* 8.6* 8.8* 9.1 9.1 9.3 9.3  MG 2.2 2.2 2.1  --  2.1  --   --   PHOS 3.8 3.9 4.0  --  4.2  --   --    Estimated Creatinine Clearance: 89.5 mL/min (by C-G formula based on SCr of 0.88 mg/dL). Liver & Pancreas: Recent Labs  Lab 03/12/21 0340 03/15/21 0337   AST 22 38  ALT 20 28  ALKPHOS 78 81  BILITOT 1.0 0.6  PROT 6.8 7.9  ALBUMIN 2.0* 2.2*   No results for input(s): LIPASE, AMYLASE in the last 168 hours. No results for input(s): AMMONIA in the last 168 hours. Diabetic: No results for input(s): HGBA1C in the last 72 hours. Recent Labs  Lab 03/16/21 2018 03/16/21 2343 03/17/21 0410 03/17/21 0748 03/17/21 1210  GLUCAP 173* 181* 202* 208* 238*   Cardiac Enzymes: No results for input(s): CKTOTAL, CKMB, CKMBINDEX, TROPONINI in the last 168 hours. No results for input(s): PROBNP in the last 8760 hours. Coagulation Profile: No results for input(s): INR, PROTIME in the last 168 hours. Thyroid Function Tests: No results for input(s): TSH, T4TOTAL, FREET4, T3FREE, THYROIDAB in the last 72 hours. Lipid Profile: Recent Labs    03/15/21 0337  TRIG 118   Anemia Panel: No results for input(s): VITAMINB12, FOLATE, FERRITIN, TIBC, IRON, RETICCTPCT in the last 72 hours. Urine analysis:    Component Value Date/Time   COLORURINE YELLOW 03/04/2021 0300   APPEARANCEUR CLEAR 03/04/2021 0300   APPEARANCEUR Clear 01/12/2016 1634   LABSPEC 1.020 03/04/2021 0300   PHURINE 5.0 03/04/2021 0300   GLUCOSEU >=500 (A) 03/04/2021 0300   HGBUR NEGATIVE 03/04/2021 0300   BILIRUBINUR NEGATIVE 03/04/2021 0300   BILIRUBINUR negative 12/06/2018 1659   BILIRUBINUR Negative 01/12/2016 1634   KETONESUR 20 (A) 03/04/2021 0300   PROTEINUR NEGATIVE 03/04/2021 0300   UROBILINOGEN 2.0 (A) 12/06/2018 1659   UROBILINOGEN 1 10/23/2013 1555   NITRITE NEGATIVE 03/04/2021 0300   LEUKOCYTESUR NEGATIVE 03/04/2021 0300   Sepsis Labs: Invalid input(s): PROCALCITONIN, LACTICIDVEN  Microbiology: No results found for this or any previous visit (from the past 240 hour(s)).  Radiology Studies: No results found.     Kelilah Hebard T. Tieara Flitton [facility]  If 7PM-7AM, please contact night-coverage [email or web address] 03/17/2021, 12:48 PM

## 2021-03-17 NOTE — Progress Notes (Signed)
Patient ID: Daniel Perkins, male   DOB: 1955/08/26, 65 y.o.   MRN: [id number] [facility] Surgery Progress Note  12 Days Post-Op  Subjective: Tolerating fulls and passing flatus. No nausea/emesis or worsening abdominal pain. No BM. Working with therapies but minimally ambulatory due to feet pain though this is improving and now mostly in the right foot  Objective: Vital signs in last 24 hours: Temp:  [98.2 F (36.8 C)-98.6 F (37 C)] 98.5 F (36.9 C) (11/09 0404) Pulse Rate:  [82-93] 83 (11/09 0404) Resp:  [16-19] 19 (11/09 0404) BP: (93-152)/(62-87) 152/87 (11/09 0404) SpO2:  [96 %-98 %] 98 % (11/09 0404) Last BM Date: 03/02/21  Intake/Output from previous day: 11/08 0701 - 11/09 0700 In: 3791.9 [P.O.:120; I.V.:3144.7; IV Piggyback:527.2] Out: 1360 [Urine:1360] Intake/Output this shift: No intake/output data recorded.  PE: Gen:  Alert, NAD, pleasant Card:  RRR Pulm: CTAB, normal effort on room air Abd: soft, no distension, +BS, very minimal TTP, lap incisions and midline c/d/i with staples present and no erythema or drainage MSK: no calf edema or TTP bilaterally Skin: warm and dry  Lab Results:  Recent Labs    03/16/21 0416 03/17/21 0313  WBC 10.8* 12.6*  HGB 11.6* 11.5*  HCT 36.7* 36.9*  PLT 481* 497*    BMET Recent Labs    03/16/21 0802 03/17/21 0313  NA 130* 130*  K 5.0 4.6  CL 99 97*  CO2 25 24  GLUCOSE 218* 199*  BUN 17 19  CREATININE 0.92 0.88  CALCIUM 9.3 9.3    PT/INR No results for input(s): LABPROT, INR in the last 72 hours. CMP     Component Value Date/Time   NA 130 (L) 03/17/2021 0313   NA 137 02/10/2021 1538   K 4.6 03/17/2021 0313   CL 97 (L) 03/17/2021 0313   CO2 24 03/17/2021 0313   GLUCOSE 199 (H) 03/17/2021 0313   BUN 19 03/17/2021 0313   BUN 13 02/10/2021 1538   CREATININE 0.88 03/17/2021 0313   CREATININE 1.22 06/30/2014 1634   CALCIUM 9.3 03/17/2021 0313   PROT 7.9 03/15/2021 0337   PROT 7.2 12/06/2018 1648    ALBUMIN 2.2 (L) 03/15/2021 0337   ALBUMIN 4.2 12/06/2018 1648   AST 38 03/15/2021 0337   ALT 28 03/15/2021 0337   ALKPHOS 81 03/15/2021 0337   BILITOT 0.6 03/15/2021 0337   BILITOT 0.3 12/06/2018 1648   GFRNONAA >60 03/17/2021 0313   GFRNONAA 78 10/23/2013 1554   GFRAA 86 06/09/2020 1508   GFRAA >89 10/23/2013 1554   Lipase  No results found for: LIPASE     Studies/Results: No results found.  Anti-infectives: Anti-infectives (From admission, onward)    Start     Dose/Rate Route Frequency Ordered Stop   03/11/21 0900  piperacillin-tazobactam (ZOSYN) IVPB 3.375 g        3.375 g 12.5 mL/hr over 240 Minutes Intravenous Every 8 hours 03/11/21 0815     03/05/21 0845  cefoTEtan (CEFOTAN) 2 g in sodium chloride 0.9 % 100 mL IVPB        2 g 200 mL/hr over 30 Minutes Intravenous To [facility] Surgical 03/04/21 2206 03/05/21 1035        Assessment/Plan SBO with no previous abdominal surgical history  POD#12 S/P diagnostic laparoscopy, lysis of single adhesive band in the left lower quadrant with resolution of bowel obstruction, mini laparotomy with primary repair of 2 mm small bowel perforation on 03/05/21 Dr. Fredricka Bonine - CT scan 11/2 with some nonspecific thickened  and dilated small bowel, possible ileus or partial SBO; small pelvic fluid collection - Fluid collection on CT is small and not amenable to drainage. Continue zosyn - continue fulls today for now - may advance to soft this afternoon. Continue TPN - discussed with pharmacy and will decrease to ~75% tomorrow - Mobilize. Working with PT/OT who are currently recommending CIR. Ambulation limited by foot pain - WBC 12.6 (10.8), afebrile. So far tolerating liquid diet well. Consider CT if he does not continue to progress or worsening leukocytosis/fevers - oral pain regimen added - staples out in next day or so   ID - cefotetan periop 10/28, zosyn 11/3>> VTE - SCDs, sq heparin FEN - IVF, FLD, ensure, TPN Foley - none    DM HTN CAD h/o MI several years ago - followed by Dr. Eldridge Dace PAD HLD Chronic neck pain   LOS: 13 days    Eric Form, [facility] Surgery 03/17/2021, 7:44 AM Please see Amion for pager number during day hours 7:00am-4:30pm

## 2021-03-17 NOTE — Progress Notes (Signed)
Physical Therapy Treatment Patient Details Name: Daniel Perkins MRN: [id number] DOB: 23-Jan-1956 Today's Date: 03/17/2021   History of Present Illness Pt is 65 y/o M admitted to ED for acute abdominal pain on 03/04/21, found to have small bowel obstruction. He is S/p ex lap, small bowel perforation repair on 10/28. PMH includes CAD/MI, gout, CKD, HTN, DM and ACDF.    PT Comments    Pt was seen for mobility on side of bed after cues to sequence body mechanics.  Pt is motivated to move but requires help to stand, but also more evenly pushing on LE's due to reduction in pain.  He is expecting to go to rehab with approval gained, and will  work on standing and gait as tolerated due to pain to prepare.  Follow goals of acute PT, and encourage OOB to chair as was done at end of session today.    Recommendations for follow up therapy are one component of a multi-disciplinary discharge planning process, led by the attending physician.  Recommendations may be updated based on patient status, additional functional criteria and insurance authorization.  Follow Up Recommendations  Acute inpatient rehab (3hours/day)     Assistance Recommended at Discharge Frequent or constant Supervision/Assistance  Equipment Recommendations  None recommended by PT    Recommendations for Other Services       Precautions / Restrictions Precautions Precautions: Other (comment) Precaution Comments: abdominal precautions Restrictions Weight Bearing Restrictions: No Other Position/Activity Restrictions: no lifting >10lbs     Mobility  Bed Mobility Overal bed mobility: Needs Assistance Bed Mobility: Supine to Sit   Sidelying to sit: Min assist Supine to sit: Min assist     General bed mobility comments: min assist to support trunk and cue roll to protect abd    Transfers Overall transfer level: Needs assistance Equipment used: Rolling walker (2 wheels) Transfers: Sit to/from Stand Sit to Stand: Min  assist           General transfer comment: pt is using both legs more equally to push up to stand    Ambulation/Gait Ambulation/Gait assistance: Min guard Gait Distance (Feet): 7 Feet Assistive device: Rolling walker (2 wheels) Gait Pattern/deviations: Step-through pattern;Decreased stride length;Trunk flexed;Wide base of support;Shuffle;Decreased dorsiflexion - left;Decreased dorsiflexion - right Gait velocity: decreased   Pre-gait activities: wgt shifting and postural correction General Gait Details: walked to chair with help, cued upright posture and safety with gait   Stairs             Wheelchair Mobility    Modified Rankin (Stroke Patients Only)       Balance Overall balance assessment: Needs assistance Sitting-balance support: Feet supported Sitting balance-Leahy Scale: Fair Sitting balance - Comments: sits with prop on one UE only now Postural control: Posterior lean Standing balance support: Bilateral upper extremity supported;During functional activity Standing balance-Leahy Scale: Poor                              Cognition Arousal/Alertness: Awake/alert Behavior During Therapy: WFL for tasks assessed/performed Overall Cognitive Status: Within Functional Limits for tasks assessed                                          Exercises General Exercises - Lower Extremity Ankle Circles/Pumps: AROM;AAROM;5 reps Long Arc Quad: Strengthening;10 reps Heel Slides: Strengthening;10 reps Hip ABduction/ADduction: Strengthening;10 reps  Other Exercises Other Exercises: rotation of hips in long sitting in chair    General Comments General comments (skin integrity, edema, etc.): pt is getting up to walk with help now, improved tolerance but still in some pain.  Pt is standing in a more guarded posture but this is abd issue, foot pain just requires help to push with walker and minimize single leg pressure      Pertinent Vitals/Pain  Pain Assessment: 0-10 Pain Score: 5  Pain Location: foot pain on RLE Pain Descriptors / Indicators: Guarding Pain Intervention(s): Limited activity within patient's tolerance;Monitored during session;Premedicated before session;Repositioned    Home Living                          Prior Function            PT Goals (current goals can now be found in the care plan section) Acute Rehab PT Goals Patient Stated Goal: to be out of pain and home Progress towards PT goals: Progressing toward goals    Frequency    Min 3X/week      PT Plan Current plan remains appropriate    Co-evaluation              AM-PAC PT "6 Clicks" Mobility   Outcome Measure  Help needed turning from your back to your side while in a flat bed without using bedrails?: A Little Help needed moving from lying on your back to sitting on the side of a flat bed without using bedrails?: A Lot Help needed moving to and from a bed to a chair (including a wheelchair)?: A Lot Help needed standing up from a chair using your arms (e.g., wheelchair or bedside chair)?: A Lot Help needed to walk in hospital room?: A Lot Help needed climbing 3-5 steps with a railing? : Total 6 Click Score: 12    End of Session Equipment Utilized During Treatment: Gait belt Activity Tolerance: Patient limited by pain Patient left: in chair;with call bell/phone within reach;with chair alarm set Nurse Communication: Mobility status PT Visit Diagnosis: Unsteadiness on feet (R26.81);Muscle weakness (generalized) (M62.81);Difficulty in walking, not elsewhere classified (R26.2);Pain Pain - Right/Left:  (bilateral) Pain - part of body: Ankle and joints of foot     Time: 8338-2505 PT Time Calculation (min) (ACUTE ONLY): 28 min  Charges:  $Therapeutic Exercise: 8-22 mins $Therapeutic Activity: 8-22 mins                  Ivar Drape 03/17/2021, 1:02 PM  Samul Dada, PT PhD Acute Rehab Dept. Number: [facility] [id number] and [facility]  [phone]

## 2021-03-17 NOTE — Progress Notes (Signed)
PHARMACY - TOTAL PARENTERAL NUTRITION CONSULT NOTE  Indication: Prolonged ileus  Patient Measurements: Height: 5\' 7"  (170.2 cm) Weight: 87.4 kg (192 lb 10.9 oz) (standing) IBW/kg (Calculated) : 66.1 TPN AdjBW (KG): 71.8 Body mass index is 30.18 kg/m.  Assessment:  [name] presented on 10/27 with acute abdominal pain, nausea and vomiting after dinner. CT showed SBO and patient underwent ex-lap with lysis of single LLQ adhesion and repair of small bowel perforation on 10/28.  NG tube clamped and clears started 11/01.  Subsequently had nausea and lower abdominal pain, so NG tube switched back to LIWS.  Repeat CT on 11/02 showed nonspecific thickened and dilated small bowel, possible ileus or SBO with a small pelvic fluid collection. Patient has had inadequate nutrition for 7 days, so Pharmacy was consulted for TPN management.  Patient reported that he typically eats 3 meals a day.  He loves steak and chicken; eats canned vegetables and fruits.  He weighed 237 lbs earlier in the year, lost ~30 lbs over the summer.  Per conversation with surgery, he is tolerating full liquid diet with no N/V but no bowel movement yet.   Glucose / Insulin: CBGs 173-202, used 30 units SSI in past 24 hrs and 35 units insulin in TPN - DM on glipizide, Victoza and Jardiance PTA (all on hold), A1c 6% Electrolytes: Na 130, K 4.6, Cl 97, CoCa 10.3, other electrolytes wnl.  Hepatic: LFTs / tbili WNL; Alb 2.2, 11/07 TGs 118  Intake / Output; MIVF: UOP 0.6 ml/kg/hr, NG removed, LBM PTA 10/25 GI Imaging: none since TPN GI Surgeries / Procedures: none since TPN  Central access: PICC placed 03/11/21 TPN start date: 03/11/21  Nutritional Goals: RD assessment 11/8: Estimated Needs Total Energy Estimated Needs: 2100-2300 kcals Total Protein Estimated Needs: 120-135 g Total Fluid Estimated Needs: >/= 2 L  Current Nutrition:  Full liquids diet starting 11/8 Ensure Enlive x2/day added - two charted as given yesterday,  increased to TID today  Plan:  Decrease TPN to 70 ml/hr (goal rate 90 ml/hr) per discussion with surgery while awaiting bowel movement and repeat imaging today. This will provide 98 g AA and 1620 kcal meeting ~77% of goal needs  Electrolytes in TPN: increase Na to 150 mEq/L, decrease Ca to 3 mEq/L. Continue K 40 mEq/L - might need to adjust tomorrow, Mg 5 mEq/L, Phos 15 mmol/L. Adjust Cl:Ac to 2:1.  Add standard MVI and trace elements to TPN Continue resistant SSI Q4H in addition to increasing to 45 units regular insulin in TPN Standard TPN labs and nursing care orders, check CMP, Mg and Phos tomorrow    Thank you for allowing pharmacy to be a part of this patient's care.  13/8, PharmD, BCPS Clinical Pharmacist 03/17/2021 7:06 AM

## 2021-03-17 NOTE — Progress Notes (Signed)
Inpatient Rehab Admissions Coordinator:   Insurance auth pending for [employer].  Awaiting determination.    Estill Dooms, PT, DPT Admissions Coordinator [phone] 03/17/21  10:23 AM

## 2021-03-17 NOTE — Progress Notes (Signed)
Occupational Therapy Treatment Patient Details Name: Daniel Perkins MRN: [id number] DOB: 04/07/1956 Today's Date: 03/17/2021   History of present illness Pt is 65 y/o M admitted to ED for acute abdominal pain on 03/04/21, found to have small bowel obstruction. He is S/p ex lap, small bowel perforation repair on 10/28. PMH includes CAD/MI, gout, CKD, HTN, DM and ACDF.   OT comments  Pt is progressing towards OT goals. Remains limited by pain, balance, and activity tolerance. During session, pt completed LB dressing and toilet transfer with Min guard (see details below). Pt reporting less pain which appears to be improving occupational performance. Remains appropriate for CIR. Will continue to follow acutely.   Recommendations for follow up therapy are one component of a multi-disciplinary discharge planning process, led by the attending physician.  Recommendations may be updated based on patient status, additional functional criteria and insurance authorization.    Follow Up Recommendations  Acute inpatient rehab (3hours/day)    Assistance Recommended at Discharge Intermittent Supervision/Assistance  Equipment Recommendations  None recommended by OT    Recommendations for Other Services      Precautions / Restrictions Precautions Precautions: Other (comment) Precaution Comments: abdominal precautions Restrictions Weight Bearing Restrictions: No Other Position/Activity Restrictions: no lifting >10lbs       Mobility Bed Mobility Overal bed mobility: Needs Assistance Bed Mobility: Sidelying to Sit;Sit to Sidelying   Sidelying to sit: Min guard    Sit to sidelying: Min guard    Transfers Overall transfer level: Needs assistance Equipment used: Rolling walker (2 wheels) Transfers: Sit to/from Stand Sit to Stand: Min guard               Balance Overall balance assessment: Needs assistance Sitting-balance support: Feet supported Sitting balance-Leahy Scale:  Fair Standing balance support: During functional activity Standing balance-Leahy Scale: Fair Standing balance comment: Able to let go of RW with both hands to pull pants over hips in standing position                           ADL either performed or assessed with clinical judgement   ADL Overall ADL's : Needs assistance/impaired                     Lower Body Dressing: Min guard;Sit to/from stand Lower Body Dressing Details (indicate cue type and reason): Donned/doffed socks, shoes, and pants at EOB. Min guard when standing to pull pants over hips. Toilet Transfer: Min guard;Ambulation;Regular Toilet;Rolling walker (2 wheels);Grab bars           Functional mobility during ADLs: Min guard;Rolling walker (2 wheels) General ADL Comments: Remains limited by pain, balance, and activity tolerance, however noted improvement in occupational performance compared to previous sessions.    Extremity/Trunk Assessment              Vision       Perception     Praxis      Cognition Arousal/Alertness: Awake/alert Behavior During Therapy: WFL for tasks assessed/performed Overall Cognitive Status: Within Functional Limits for tasks assessed                                            Exercises    Shoulder Instructions       General Comments     Pertinent Vitals/ Pain  Pain Assessment: 0-10 Pain Score: 1  Pain Location: abdomen when coughing/belching Pain Descriptors / Indicators: Guarding Pain Intervention(s): Limited activity within patient's tolerance;Repositioned;Monitored during session  Home Living                                          Prior Functioning/Environment              Frequency  Min 2X/week        Progress Toward Goals  OT Goals(current goals can now be found in the care plan section)  Progress towards OT goals: Progressing toward goals  Acute Rehab OT Goals Patient Stated Goal:  reduce pain OT Goal Formulation: With patient Time For Goal Achievement: 03/23/21 Potential to Achieve Goals: Good ADL Goals Pt Will Perform Upper Body Dressing: sitting;with supervision Pt Will Perform Lower Body Dressing: sit to/from stand;with min guard assist Pt Will Transfer to Toilet: regular height toilet;with min guard assist Pt Will Perform Tub/Shower Transfer: with min assist  Plan Discharge plan remains appropriate;Frequency remains appropriate    Co-evaluation                 AM-PAC OT "6 Clicks" Daily Activity     Outcome Measure   Help from another person eating meals?: None Help from another person taking care of personal grooming?: A Little Help from another person toileting, which includes using toliet, bedpan, or urinal?: A Little Help from another person bathing (including washing, rinsing, drying)?: A Lot Help from another person to put on and taking off regular upper body clothing?: A Little Help from another person to put on and taking off regular lower body clothing?: A Little 6 Click Score: 18    End of Session Equipment Utilized During Treatment: Gait belt;Rolling walker (2 wheels)  OT Visit Diagnosis: Unsteadiness on feet (R26.81);Other abnormalities of gait and mobility (R26.89);Muscle weakness (generalized) (M62.81);Pain   Activity Tolerance Patient tolerated treatment well   Patient Left in bed;with call bell/phone within reach   Nurse Communication Mobility status        Time: 1511-1530 OT Time Calculation (min): 19 min  Charges: OT General Charges $OT Visit: 1 Visit OT Treatments $Self Care/Home Management : 8-22 mins  Rawlin Reaume C, OT/L  Acute Rehab [phone]  Lenice Llamas 03/17/2021, 3:47 PM

## 2021-03-17 NOTE — Progress Notes (Signed)
Mobility Specialist Criteria Algorithm Info.   Mobility Team: HOB elevated: Activity: Ambulated in hall; Dangled on edge of bed (LE exercises) Range of motion: Active; All extremities Level of assistance: Contact guard assist, steadying assist (Min A sit to stand; Min G ambulating) Assistive device: Front wheel walker Distance ambulated (ft): 35 ft Mobility response: Tolerated well Bed Position: Semi-fowlers  Patient received in bed. Just got back into bed from chair but still very eager to participate in mobility despite just returning to bed. Was able to get to EOB independently. Required minimal assistance to don shoes. Stood Min A and ambulated in hallway at min guard with very slow gait. Returned to room and performed LE exercises. Tolerated ambulation and exercises well. Pain is better and was able to tolerate weight bearing. Tolerated ambulation well without complaint or incident and was left lying in bed with all needs met.    03/17/2021 3:02 PM

## 2021-03-18 DIAGNOSIS — K56609 Unspecified intestinal obstruction, unspecified as to partial versus complete obstruction: Secondary | ICD-10-CM | POA: Diagnosis not present

## 2021-03-18 DIAGNOSIS — N179 Acute kidney failure, unspecified: Secondary | ICD-10-CM

## 2021-03-18 DIAGNOSIS — E440 Moderate protein-calorie malnutrition: Secondary | ICD-10-CM | POA: Diagnosis not present

## 2021-03-18 DIAGNOSIS — M1A9XX0 Chronic gout, unspecified, without tophus (tophi): Secondary | ICD-10-CM | POA: Diagnosis not present

## 2021-03-18 DIAGNOSIS — E785 Hyperlipidemia, unspecified: Secondary | ICD-10-CM | POA: Diagnosis not present

## 2021-03-18 LAB — RETICULOCYTES
Immature Retic Fract: 13.7 % (ref 2.3–15.9)
RBC.: 4.78 MIL/uL (ref 4.22–5.81)
Retic Count, Absolute: 57.4 10*3/uL (ref 19.0–186.0)
Retic Ct Pct: 1.2 % (ref 0.4–3.1)

## 2021-03-18 LAB — CBC WITH DIFFERENTIAL/PLATELET
Abs Immature Granulocytes: 0.18 10*3/uL — ABNORMAL HIGH (ref 0.00–0.07)
Basophils Absolute: 0.1 10*3/uL (ref 0.0–0.1)
Basophils Relative: 0 %
Eosinophils Absolute: 0.3 10*3/uL (ref 0.0–0.5)
Eosinophils Relative: 3 %
HCT: 34.3 % — ABNORMAL LOW (ref 39.0–52.0)
Hemoglobin: 10.5 g/dL — ABNORMAL LOW (ref 13.0–17.0)
Immature Granulocytes: 2 %
Lymphocytes Relative: 15 %
Lymphs Abs: 1.7 10*3/uL (ref 0.7–4.0)
MCH: 21.6 pg — ABNORMAL LOW (ref 26.0–34.0)
MCHC: 30.6 g/dL (ref 30.0–36.0)
MCV: 70.6 fL — ABNORMAL LOW (ref 80.0–100.0)
Monocytes Absolute: 2 10*3/uL — ABNORMAL HIGH (ref 0.1–1.0)
Monocytes Relative: 17 %
Neutro Abs: 7.4 10*3/uL (ref 1.7–7.7)
Neutrophils Relative %: 63 %
Platelets: 467 10*3/uL — ABNORMAL HIGH (ref 150–400)
RBC: 4.86 MIL/uL (ref 4.22–5.81)
RDW: 14.6 % (ref 11.5–15.5)
WBC: 11.7 10*3/uL — ABNORMAL HIGH (ref 4.0–10.5)
nRBC: 0 % (ref 0.0–0.2)

## 2021-03-18 LAB — IRON AND TIBC
Iron: 29 ug/dL — ABNORMAL LOW (ref 45–182)
Saturation Ratios: 10 % — ABNORMAL LOW (ref 17.9–39.5)
TIBC: 279 ug/dL (ref 250–450)
UIBC: 250 ug/dL

## 2021-03-18 LAB — GLUCOSE, CAPILLARY
Glucose-Capillary: 139 mg/dL — ABNORMAL HIGH (ref 70–99)
Glucose-Capillary: 147 mg/dL — ABNORMAL HIGH (ref 70–99)
Glucose-Capillary: 152 mg/dL — ABNORMAL HIGH (ref 70–99)
Glucose-Capillary: 161 mg/dL — ABNORMAL HIGH (ref 70–99)
Glucose-Capillary: 182 mg/dL — ABNORMAL HIGH (ref 70–99)
Glucose-Capillary: 226 mg/dL — ABNORMAL HIGH (ref 70–99)

## 2021-03-18 LAB — COMPREHENSIVE METABOLIC PANEL
ALT: 33 U/L (ref 0–44)
AST: 37 U/L (ref 15–41)
Albumin: 2.1 g/dL — ABNORMAL LOW (ref 3.5–5.0)
Alkaline Phosphatase: 84 U/L (ref 38–126)
Anion gap: 5 (ref 5–15)
BUN: 31 mg/dL — ABNORMAL HIGH (ref 8–23)
CO2: 24 mmol/L (ref 22–32)
Calcium: 9.1 mg/dL (ref 8.9–10.3)
Chloride: 102 mmol/L (ref 98–111)
Creatinine, Ser: 1.25 mg/dL — ABNORMAL HIGH (ref 0.61–1.24)
GFR, Estimated: >60 mL/min (ref >60)
Glucose, Bld: 202 mg/dL — ABNORMAL HIGH (ref 70–99)
Potassium: 4.9 mmol/L (ref 3.5–5.1)
Sodium: 131 mmol/L — ABNORMAL LOW (ref 135–145)
Total Bilirubin: 0.6 mg/dL (ref 0.3–1.2)
Total Protein: 7.8 g/dL (ref 6.5–8.1)

## 2021-03-18 LAB — MAGNESIUM: Magnesium: 2.1 mg/dL (ref 1.7–2.4)

## 2021-03-18 LAB — FERRITIN: Ferritin: 476 ng/mL — ABNORMAL HIGH (ref 24–336)

## 2021-03-18 LAB — FOLATE: Folate: 10.4 ng/mL (ref >5.9)

## 2021-03-18 LAB — C-REACTIVE PROTEIN: CRP: 9.5 mg/dL — ABNORMAL HIGH (ref <1.0)

## 2021-03-18 LAB — PHOSPHORUS: Phosphorus: 4.6 mg/dL (ref 2.5–4.6)

## 2021-03-18 LAB — SEDIMENTATION RATE: Sed Rate: 115 mm/hr — ABNORMAL HIGH (ref 0–16)

## 2021-03-18 LAB — VITAMIN B12: Vitamin B-12: 784 pg/mL (ref 180–914)

## 2021-03-18 MED ORDER — SODIUM CHLORIDE 0.9 % IV SOLN
2.0000 g | INTRAVENOUS | Status: DC
  Administered 2021-03-18 – 2021-03-24 (×7): 2 g via INTRAVENOUS
  Filled 2021-03-18 (×7): qty 20

## 2021-03-18 MED ORDER — INSULIN GLARGINE-YFGN 100 UNIT/ML ~~LOC~~ SOLN
15.0000 [IU] | Freq: Every day | SUBCUTANEOUS | Status: DC
  Administered 2021-03-18 – 2021-03-25 (×8): 15 [IU] via SUBCUTANEOUS
  Filled 2021-03-18 (×9): qty 0.15

## 2021-03-18 MED ORDER — METRONIDAZOLE 500 MG PO TABS
500.0000 mg | ORAL_TABLET | Freq: Two times a day (BID) | ORAL | Status: DC
  Administered 2021-03-18 – 2021-03-25 (×14): 500 mg via ORAL
  Filled 2021-03-18 (×14): qty 1

## 2021-03-18 MED ORDER — CARVEDILOL 6.25 MG PO TABS
6.2500 mg | ORAL_TABLET | Freq: Two times a day (BID) | ORAL | Status: DC
  Administered 2021-03-18 – 2021-03-25 (×14): 6.25 mg via ORAL
  Filled 2021-03-18 (×15): qty 1

## 2021-03-18 MED ORDER — TRAVASOL 10 % IV SOLN
INTRAVENOUS | Status: AC
  Filled 2021-03-18: qty 974.4

## 2021-03-18 NOTE — Progress Notes (Signed)
Patient ID: Daniel Perkins, male   DOB: 1955-12-28, 65 y.o.   MRN: [id number] [facility] Surgery Progress Note  13 Days Post-Op  Subjective: Tolerating soft diet well and small BM last night. No nausea or emesis. Abdominal pain stable to improved. Feet pain improved and ambulated in the hallway yesterday  Objective: Vital signs in last 24 hours: Temp:  [98 F (36.7 C)-98.4 F (36.9 C)] 98 F (36.7 C) (11/10 0700) Pulse Rate:  [76-98] 78 (11/10 0700) Resp:  [17-22] 22 (11/10 0700) BP: (97-141)/(66-83) 103/72 (11/10 0700) SpO2:  [94 %-100 %] 96 % (11/10 0700) Last BM Date: 03/02/21  Intake/Output from previous day: 11/09 0701 - 11/10 0700 In: 2068.8 [P.O.:240; I.V.:1758.8; IV Piggyback:70.1] Out: 400 [Urine:400] Intake/Output this shift: No intake/output data recorded.  PE: Gen:  Alert, NAD, pleasant Card:  RRR Pulm: CTAB, normal effort on room air Abd: soft, no distension, +BS, very minimal TTP near incisions, lap incisions and midline c/d/i with staples present and no spreading erythema or drainage MSK: no calf edema or TTP bilaterally Skin: warm and dry  Lab Results:  Recent Labs    03/17/21 0313 03/18/21 0327  WBC 12.6* 11.7*  HGB 11.5* 10.5*  HCT 36.9* 34.3*  PLT 497* 467*    BMET Recent Labs    03/17/21 0313 03/18/21 0327  NA 130* 131*  K 4.6 4.9  CL 97* 102  CO2 24 24  GLUCOSE 199* 202*  BUN 19 31*  CREATININE 0.88 1.25*  CALCIUM 9.3 9.1    PT/INR No results for input(s): LABPROT, INR in the last 72 hours. CMP     Component Value Date/Time   NA 131 (L) 03/18/2021 0327   NA 137 02/10/2021 1538   K 4.9 03/18/2021 0327   CL 102 03/18/2021 0327   CO2 24 03/18/2021 0327   GLUCOSE 202 (H) 03/18/2021 0327   BUN 31 (H) 03/18/2021 0327   BUN 13 02/10/2021 1538   CREATININE 1.25 (H) 03/18/2021 0327   CREATININE 1.22 06/30/2014 1634   CALCIUM 9.1 03/18/2021 0327   PROT 7.8 03/18/2021 0327   PROT 7.2 12/06/2018 1648   ALBUMIN 2.1 (L)  03/18/2021 0327   ALBUMIN 4.2 12/06/2018 1648   AST 37 03/18/2021 0327   ALT 33 03/18/2021 0327   ALKPHOS 84 03/18/2021 0327   BILITOT 0.6 03/18/2021 0327   BILITOT 0.3 12/06/2018 1648   GFRNONAA >60 03/18/2021 0327   GFRNONAA 78 10/23/2013 1554   GFRAA 86 06/09/2020 1508   GFRAA >89 10/23/2013 1554   Lipase  No results found for: LIPASE     Studies/Results: No results found.  Anti-infectives: Anti-infectives (From admission, onward)    Start     Dose/Rate Route Frequency Ordered Stop   03/11/21 0900  piperacillin-tazobactam (ZOSYN) IVPB 3.375 g        3.375 g 12.5 mL/hr over 240 Minutes Intravenous Every 8 hours 03/11/21 0815     03/05/21 0845  cefoTEtan (CEFOTAN) 2 g in sodium chloride 0.9 % 100 mL IVPB        2 g 200 mL/hr over 30 Minutes Intravenous To ShortStay Surgical 03/04/21 2206 03/05/21 1035        Assessment/Plan SBO with no previous abdominal surgical history  POD#13 S/P diagnostic laparoscopy, lysis of single adhesive band in the left lower quadrant with resolution of bowel obstruction, mini laparotomy with primary repair of 2 mm small bowel perforation on 03/05/21 Dr. Fredricka Bonine - CT scan 11/2 with some nonspecific thickened and dilated small  bowel, possible ileus or partial SBO; small pelvic fluid collection - Fluid collection on CT is small and not amenable to drainage. Continue zosyn - tolerated soft diet. Continue TPN and follow PO intake ~75% today and can continue to wean - Mobilize. Working with PT/OT who are currently recommending CIR. - WBC 11.7 (12.6), afebrile. Consider CT if he does not continue to progress or worsening leukocytosis/fevers - oral pain regimen added - staples out at discharge   Doing well surgically. Hopefully stable for discharge to CIR soon as PO intake improves  ID - cefotetan periop 10/28, zosyn 11/3>> VTE - SCDs, sq heparin FEN - IVF, soft, ensure tid, TPN Foley - none   DM HTN CAD h/o MI several years ago - followed  by Dr. Eldridge Dace PAD HLD Chronic neck pain   LOS: 14 days    Eric Form, [facility] Surgery 03/18/2021, 7:58 AM Please see Amion for pager number during day hours 7:00am-4:30pm

## 2021-03-18 NOTE — Progress Notes (Signed)
IR received request from Infectious Disease team to evaluate patient for possible aspiration of pelvic abscess. Most recent imaging is from 03/10/21 and per surgery notes this fluid collection has been deemed too small to drain. IR reached out to the surgical team today to discuss this request from ID. The surgical team is not recommending drain or repeat imaging at this time as patient is showing clinical improvement but stated they will defer to ID on this issue.   Dr. Elinor Parkinson notified of this information and would like to proceed with possible abscess aspiration work up. IR recommended repeat imaging for further evaluation and this will be ordered by Dr. Elinor Parkinson. IR will await these results.  Alwyn Ren, [place] [phone] 03/18/2021, 3:17 PM

## 2021-03-18 NOTE — Progress Notes (Signed)
Mobility Specialist Progress Note:   03/18/21 1445  Mobility  Activity Ambulated in hall  Level of Assistance Contact guard assist, steadying assist  Assistive Device Front wheel walker  Distance Ambulated (ft) 230 ft  Mobility Ambulated with assistance in hallway  Mobility Response Tolerated well  Mobility performed by Mobility specialist  Bed Position Chair  $Mobility charge 1 Mobility   Pt asx during ambulation. Very pleased with himself today. Sitting up in chair.   Addison Lank Mobility Specialist  Phone [phone]

## 2021-03-18 NOTE — Consult Note (Addendum)
[facility]                                                                                        Patient Identification: Patient Name: Daniel Perkins MRN: [id number] [place] Date: 03/04/2021  2:51 AM Today's Date: 03/18/2021 Reason for consult:  Requesting provider:   Active Problems:   Diabetes mellitus due to underlying condition, uncontrolled, with hyperglycemia ([place])   Hyperlipemia   HYPERTENSION   Gout   SBO (small bowel obstruction) ([place])   Malnutrition of moderate degree   Antibiotics: Pip/tazo 11/3-current   Lines/Hardware: RT brachial picc  Assessment # Pelvic abscess in the setting of below (3.0 x 3.1 x 4.2 cm) # SBO s/p Diagnostic laparoscopy, lysis of single adhesive band in the left lower quadrant with resolution of bowel obstruction, mini laparotomy with primary repair of 2 mm small bowel perforation on 03/05/21.  # Bilateral Lower lobe atelectasis/air space disease: on room air. PT and OT following # On TPN via PICC  # AKI - Cr elevated to 1.25 # DM  Recommendations  Will change zosyn to ceftriaxone and metronidazole in the setting of AKI 2.   Recommend IR evaluation and possible aspiration of the pelvic abscess to help with source control ( ordered). If amenable, please send sample for gram stain and aerobic/anaerobic cultures  3.   Monitor CBC and CMP 4.   Fu Surgery recs  5.   Mobilize patient   Addendum: Per IR, needs repeat CT abdomen/pelvis to evaluate abscess for considering aspiration.  Recommend to repeat ct abd/pelvis w contrast tomorrow if AKI resolves   Following   Rest of the management as per the primary team. Please call with questions or concerns. Thank you for the consult  Rosiland Oz, MD Infectious Disease Physician [facility] [address] [place], [place] [address] Phone:  [phone]  Fax: [phone]  __________________________________________________________________________________________________________ HPI and Hospital Course: 65 y o male with PMH of CAD/MI, DM, HLD, HTN, CKD, COVID 64, Gout who presented to the ED initially on 10/27 with complains of multiple episodes of nausea, vomiting  for 1 day.   At ED, afebrile with no leukocytosis CT abdomen/pelvis with SBO with a transition within the pelvis on the left  Underwent Diagnostic laparoscopy, lysis of single adhesive band in the left lower quadrant with resolution of bowel obstruction, mini laparotomy with primary repair of 2 mm small bowel perforation on 03/05/21.  Post op course complicated by persistent ieus requiring NG tube followed by TPN. Repeat CT abd/pelvis 11/2 with partial SBO, new enhancing fluid collection in the pelvis represent abscess and new bilateral lower lobe atelectasis and airspace disease.  Patient has been afberile with mild leukocytosis, on zosyn since 11/3. Says he has been tolerating PO diet without any difficulty. Denies any nausea, vomiting, abdominal pain and diarrhea. He has a little BM yesterday and none today  Quit smoking 12 years ago, drinks 2 beers in a week, no IVDU  ROS: 12 point ROS done with pertinent positives and negatives listed above  Past Medical History:  Diagnosis Date  Club foot of both lower extremities 1958   Coronary artery disease    Holter monitor 02/2012 - sinus with rare PVC   CTEV (congenital talipes equinovarus)    Gout    Hyperlipidemia    Hypertension    Myocardial infarction [facility]) ~ 2007   "mild"   Osteomyelitis ([place])    Pneumonia 2000's   "couple times"   Pneumonia due to COVID-19 virus 04/28/2019   Type II diabetes mellitus ([place]) 2011   Past Surgical History:  Procedure Laterality Date   ANTERIOR CERVICAL DECOMP/DISCECTOMY FUSION  X 2   CARDIAC CATHETERIZATION     FOOT FRACTURE SURGERY Right 1990's   "pt a steel plate  in"   FOOT SURGERY Left x16   "joints collapsed; keep getting infected"   LAPAROSCOPY N/A 03/05/2021   Procedure: LAPAROSCOPY DIAGNOSTIC;  Surgeon: Clovis Riley, MD;  Location: [facility];  Service: General;  Laterality: N/A;   LAPAROTOMY Right 03/05/2021   Procedure: EXPLORATORY LAPAROTOMY, REPAIR SMALL BOWEL PERFORATION;  Surgeon: Clovis Riley, MD;  Location: [place] [facility];  Service: General;  Laterality: Right;   LYSIS OF ADHESION  03/05/2021   Procedure: LYSIS OF ADHESION;  Surgeon: Clovis Riley, MD;  Location: [place];  Service: General;;   POSTERIOR CERVICAL FUSION/FORAMINOTOMY  X 2     Scheduled Meds:  carvedilol  12.5 mg Oral BID AC   Chlorhexidine Gluconate Cloth  6 each Topical Daily   colchicine  0.6 mg Oral Daily   diclofenac Sodium  2 g Topical QID   feeding supplement  237 mL Oral TID BM   gabapentin  100 mg Oral BID   heparin injection (subcutaneous)  5,000 Units Subcutaneous Q8H   insulin aspart  0-20 Units Subcutaneous Q4H   insulin glargine-yfgn  15 Units Subcutaneous Daily   lidocaine  1 patch Transdermal Q24H   melatonin  6 mg Oral QHS   pantoprazole (PROTONIX) IV  40 mg Intravenous Q12H   sodium chloride flush  10-40 mL Intracatheter Q12H   Continuous Infusions:  methocarbamol (ROBAXIN) IV     piperacillin-tazobactam (ZOSYN)  IV 3.375 g (03/18/21 0821)   TPN ADULT (ION) 70 mL/hr at 03/17/21 1749   TPN ADULT (ION)     PRN Meds:.acetaminophen, hydrALAZINE, HYDROmorphone (DILAUDID) injection, lip balm, methocarbamol (ROBAXIN) IV, morphine injection, nitroGLYCERIN, ondansetron (ZOFRAN) IV, oxyCODONE, phenol, sodium chloride flush  Allergies  Allergen Reactions   Morphine Itching   Pravastatin Sodium Other (See Comments)    Severe muscle cramps with one time requiring admission.    Social History   Socioeconomic History   Marital status: Legally Separated    Spouse name: Not on file   Number of children: Not on file   Years of education: 13   Highest  education level: Not on file  Occupational History    Employer: UNEMPLOYED   Occupation: unemployed  Tobacco Use   Smoking status: Former    Packs/day: 1.00    Years: 32.00    Pack years: 32.00    Types: Cigarettes    Quit date: 07/06/2007    Years since quitting: 13.7   Smokeless tobacco: Never  Vaping Use   Vaping Use: Never used  Substance and Sexual Activity   Alcohol use: Yes    Alcohol/week: 0.0 standard drinks    Comment: Sometimes beer.   Drug use: No   Sexual activity: Not on file  Other Topics Concern   Not on file  Social History Narrative   Not on  file   Social Determinants of Health   Financial Resource Strain: Not on file  Food Insecurity: Not on file  Transportation Needs: Not on file  Physical Activity: Not on file  Stress: Not on file  Social Connections: Not on file  Intimate Partner Violence: Not on file   Family History  Problem Relation Age of Onset   Diabetes Mother    Coronary artery disease Mother        HAS PACEMAKER   Heart disease Mother    Heart attack Brother    Lung disease Neg Hx    Vitals BP 103/72 (BP Location: Left Arm)   Pulse 78   Temp 98 F (36.7 C) (Oral)   Resp (!) 22   Ht 5\' 7"  (1.702 m)   Wt 87.4 kg Comment: standing  SpO2 96%   BMI 30.18 kg/m    Physical Exam Constitutional:  lying in the bed and resting comfortably    Comments:   Cardiovascular:     Rate and Rhythm: Normal rate and regular rhythm.     Heart sounds:   Pulmonary:     Effort: Pulmonary effort is normal.     Comments: clear lung sounds bilaterally  Abdominal:     Palpations: Abdomen is soft.     Tenderness: non tender and non distended, surgical site is healing well   Musculoskeletal:        General: No swelling or tenderness. Rt arm PICC line - OK   Skin:    Comments: No lesions or rashes   Neurological:     General: grossly non focal   Psychiatric:        Mood and Affect: Mood normal.    Pertinent Microbiology Results for  orders placed or performed during the hospital encounter of 03/04/21  Resp Panel by RT-PCR (Flu A&B, Covid) Nasopharyngeal Swab     Status: None   Collection Time: 03/04/21  7:44 AM   Specimen: Nasopharyngeal Swab; Nasopharyngeal(NP) swabs in vial transport medium  Result Value Ref Range Status   SARS Coronavirus 2 by RT PCR NEGATIVE NEGATIVE Final    Comment: (NOTE) SARS-CoV-2 target nucleic acids are NOT DETECTED.  The SARS-CoV-2 RNA is generally detectable in upper respiratory specimens during the acute phase of infection. The lowest concentration of SARS-CoV-2 viral copies this assay can detect is 138 copies/mL. A negative result does not preclude SARS-Cov-2 infection and should not be used as the sole basis for treatment or other patient management decisions. A negative result may occur with  improper specimen collection/handling, submission of specimen other than nasopharyngeal swab, presence of viral mutation(s) within the areas targeted by this assay, and inadequate number of viral copies(<138 copies/mL). A negative result must be combined with clinical observations, patient history, and epidemiological information. The expected result is Negative.  Fact Sheet for Patients:  EntrepreneurPulse.com.au  Fact Sheet for Healthcare Providers:  IncredibleEmployment.be  This test is no t yet approved or cleared by the [place] FDA and  has been authorized for detection and/or diagnosis of SARS-CoV-2 by FDA under an Emergency Use Authorization (EUA). This EUA will remain  in effect (meaning this test can be used) for the duration of the COVID-19 declaration under Section 564(b)(1) of the Act, 21 U.S.C.section 360bbb-3(b)(1), unless the authorization is terminated  or revoked sooner.       Influenza A by PCR NEGATIVE NEGATIVE Final   Influenza B by PCR NEGATIVE NEGATIVE Final    Comment: (NOTE) The Xpert Xpress SARS-CoV-2/FLU/RSV  plus  assay is intended as an aid in the diagnosis of influenza from Nasopharyngeal swab specimens and should not be used as a sole basis for treatment. Nasal washings and aspirates are unacceptable for Xpert Xpress SARS-CoV-2/FLU/RSV testing.  Fact Sheet for Patients: EntrepreneurPulse.com.au  Fact Sheet for Healthcare Providers: IncredibleEmployment.be  This test is not yet approved or cleared by the [place] FDA and has been authorized for detection and/or diagnosis of SARS-CoV-2 by FDA under an Emergency Use Authorization (EUA). This EUA will remain in effect (meaning this test can be used) for the duration of the COVID-19 declaration under Section 564(b)(1) of the Act, 21 U.S.C. section 360bbb-3(b)(1), unless the authorization is terminated or revoked.  Performed at [facility], [place] [address] [place], [place] [address]   Surgical pcr screen     Status: None   Collection Time: 03/05/21  2:58 AM   Specimen: Nasal Mucosa; Nasal Swab  Result Value Ref Range Status   MRSA, PCR NEGATIVE NEGATIVE Final   Staphylococcus aureus NEGATIVE NEGATIVE Final    Comment: (NOTE) The Xpert SA Assay (FDA approved for NASAL specimens in patients 38 years of age and older), is one component of a comprehensive surveillance program. It is not intended to diagnose infection nor to guide or monitor treatment. Performed at [facility], [place] [address], [place], [place] [address]     Pertinent Lab seen by me: CBC Latest Ref Rng & Units 03/18/2021 03/17/2021 03/16/2021  WBC 4.0 - 10.5 K/uL 11.7(H) 12.6(H) 10.8(H)  Hemoglobin 13.0 - 17.0 g/dL 10.5(L) 11.5(L) 11.6(L)  Hematocrit 39.0 - 52.0 % 34.3(L) 36.9(L) 36.7(L)  Platelets 150 - 400 K/uL 467(H) 497(H) 481(H)   CMP Latest Ref Rng & Units 03/18/2021 03/17/2021 03/16/2021  Glucose 70 - 99 mg/dL 202(H) 199(H) 218(H)  BUN 8 - 23 mg/dL 31(H) 19 17  Creatinine 0.61 - 1.24 mg/dL 1.25(H) 0.88 0.92   Sodium 135 - 145 mmol/L 131(L) 130(L) 130(L)  Potassium 3.5 - 5.1 mmol/L 4.9 4.6 5.0  Chloride 98 - 111 mmol/L 102 97(L) 99  CO2 22 - 32 mmol/L 24 24 25   Calcium 8.9 - 10.3 mg/dL 9.1 9.3 9.3  Total Protein 6.5 - 8.1 g/dL 7.8 - -  Total Bilirubin 0.3 - 1.2 mg/dL 0.6 - -  Alkaline Phos 38 - 126 U/L 84 - -  AST 15 - 41 U/L 37 - -  ALT 0 - 44 U/L 33 - -     Pertinent Imagings/Other Imagings Plain films and CT images have been personally visualized and interpreted; radiology reports have been reviewed. Decision making incorporated into the Impression / Recommendations. DG Abd 1 View  Result Date: 03/10/2021 CLINICAL DATA:  Feeding tube placement EXAM: ABDOMEN - 1 VIEW COMPARISON:  03/05/2021 FINDINGS: Large-bore esophagogastric tube is positioned with tip below the diaphragm, in the gastric fundus, and side port near the level of the gastroesophageal junction. Recommend advancement to ensure subdiaphragmatic positioning. There is no enteric feeding tube identified in the lower chest or abdomen per reported exam indication. Gas-filled, nondistended loops of small bowel and colon in the included upper abdomen. IMPRESSION: 1. Large-bore esophagogastric tube is positioned with tip below the diaphragm, in the gastric fundus, and side port near the level of the gastroesophageal junction. Recommend advancement to ensure subdiaphragmatic positioning of tip and side port. 2. No enteric feeding tube identified in the lower chest or abdomen per reported exam indication. Electronically Signed   By: Jamse Mead.D.  On: 03/10/2021 12:20   DG Abd 1 View  Result Date: 03/04/2021 CLINICAL DATA:  Small-bowel obstruction, vomiting EXAM: ABDOMEN - 1 VIEW COMPARISON:  03/04/2021, 10:25 a.m. FINDINGS: Interval administration of enteric contrast, which is present in the gastric fundus and lower esophagus. There is dilute contrast present in distended loops of mid small bowel measuring up to 5.1 cm in caliber. There  is a moderate burden of gas and stool throughout the colon to the rectum. No obvious free air. Esophagogastric tube remains with tip and side port below the diaphragm. IMPRESSION: 1. Interval administration of enteric contrast, which is present in the gastric fundus and lower esophagus. There is dilute contrast present in distended loops of mid small bowel measuring up to 5.1 cm in caliber. Findings remain consistent with small bowel obstruction. 2. There is a moderate burden of gas and stool throughout the colon to the rectum. Electronically Signed   By: Delanna Ahmadi M.D.   On: 03/04/2021 13:50   CT ABDOMEN PELVIS W CONTRAST  Result Date: 03/10/2021 CLINICAL DATA:  Abdominal distension with increased pain. Five days status post exploratory laparotomy and small bowel perforation. EXAM: CT ABDOMEN AND PELVIS WITH CONTRAST TECHNIQUE: Multidetector CT imaging of the abdomen and pelvis was performed using the standard protocol following bolus administration of intravenous contrast. CONTRAST:  181mL OMNIPAQUE IOHEXOL 300 MG/ML  SOLN COMPARISON:  CT abdomen and pelvis 03/04/2021. FINDINGS: Lower chest: There are new bands of atelectasis/airspace disease in the bilateral lower lobes. Mild emphysematous changes are present. Hepatobiliary: Hyperdensity in the gallbladder is new from prior, likely excreted contrast. No radiopaque gallstones or bile duct dilatation. No focal liver lesions are identified. Pancreas: Unremarkable. No pancreatic ductal dilatation or surrounding inflammatory changes. Spleen: Normal in size without focal abnormality. Adrenals/Urinary Tract: Adrenal glands are unremarkable. Kidneys are normal, without renal calculi, focal lesion, or hydronephrosis. Bladder is unremarkable. Stomach/Bowel: There is a moderate length segment of small bowel in the right abdomen demonstrating circumferential wall thickening/edema with mesenteric edema. There is no pneumatosis. Small bowel loops proximal to this level  are dilated measuring up to 3.8 cm. Small bowel loops distal to this level are nondilated. Oral contrast reaches the distal ileum. Colon and appendix are within normal limits. Nasogastric tube tip is in the body of the stomach. The stomach is otherwise within normal limits. There is no free air. Vascular/Lymphatic: Aortic atherosclerosis. No enlarged abdominal or pelvic lymph nodes. Reproductive: Prostate is unremarkable. Other: There is a new peripherally enhancing fluid collection within the pelvis anterior to the rectum measuring 3.0 x 3.1 x 4.2 cm. There is no focal abdominal wall hernia. Midline skin staples are present. Injection sites noted in the anterior abdominal wall. Musculoskeletal: Stable mixed lucencies with coarsened trabeculae in the T12 and L1 vertebral bodies. IMPRESSION: 1. Moderate length segment small bowel in the right abdomen demonstrating wall thickening and mesenteric edema worrisome for nonspecific enteritis including infectious, inflammatory and ischemic etiologies. Small bowel loops proximal to this level are dilated, but oral contrast is seen distal to this level. Findings are compatible with partial small bowel obstruction. 2. New enhancing fluid collection in the pelvis may represent abscess. 3. Nasogastric tube tip in the mid stomach. 4. New bilateral lower lobe atelectasis/airspace disease. 5. These results were called by telephone at the time of interpretation on 03/10/2021 at 8:33 pm to provider NP Gershon Cull, who verbally acknowledged these results. Electronically Signed   By: Ronney Asters M.D.   On: 03/10/2021 20:34   CT  ABDOMEN PELVIS W CONTRAST  Result Date: 03/04/2021 CLINICAL DATA:  Abdominal pain. EXAM: CT ABDOMEN AND PELVIS WITH CONTRAST TECHNIQUE: Multidetector CT imaging of the abdomen and pelvis was performed using the standard protocol following bolus administration of intravenous contrast. CONTRAST:  OMNIPAQUE IOHEXOL 300 MG/ML  SOLN COMPARISON:  None.  FINDINGS: Lower chest: No acute abnormality. Hepatobiliary: There is diffuse fatty infiltration of the liver parenchyma. No focal liver abnormality is seen. No gallstones, gallbladder wall thickening, or biliary dilatation. Pancreas: Unremarkable. No pancreatic ductal dilatation or surrounding inflammatory changes. Spleen: Normal in size without focal abnormality. Adrenals/Urinary Tract: Adrenal glands are unremarkable. Kidneys are normal, without renal calculi, focal lesion, or hydronephrosis. Bladder is unremarkable. Stomach/Bowel: There is a small hiatal hernia. Appendix appears normal. Multiple dilated small bowel loops are seen within the mid and lower left abdomen. A transition zone is suspected within the pelvis on the left (axial CT images 57 through 62, CT series 3). Vascular/Lymphatic: Aortic atherosclerosis. No enlarged abdominal or pelvic lymph nodes. Reproductive: Prostate is unremarkable. Other: No abdominal wall hernia or abnormality. No abdominopelvic ascites. Musculoskeletal: No acute or significant osseous findings. IMPRESSION: 1. Small-bowel obstruction with a transition zone noted within the pelvis on the left. 2. Hepatic steatosis. 3. Aortic atherosclerosis. Aortic Atherosclerosis (ICD10-I70.0). Electronically Signed   By: Aram Candela M.D.   On: 03/04/2021 04:04   CT CARDIAC SCORING (SELF PAY ONLY)  Addendum Date: 02/22/2021   ADDENDUM REPORT: 02/22/2021 23:25 CLINICAL DATA:  Cardiovascular Disease Risk stratification EXAM: Coronary Calcium Score TECHNIQUE: A gated, non-contrast computed tomography scan of the heart was performed using 3mm slice thickness. Axial images were analyzed on a dedicated workstation. Calcium scoring of the coronary arteries was performed using the Agatston method. FINDINGS: Coronary arteries: Normal origins. Coronary Calcium Score: Left main: 0 Left anterior descending artery: 38.2 Left circumflex artery: 24.3 Right coronary artery: 5.29 Total: 67.9  Percentile: 72nd Pericardium: Normal. Ascending Aorta: Normal caliber. Scattered calcifications in the ascending and descending aorta. Non-cardiac: See separate report from [facility]. IMPRESSION: Coronary calcium score of 67.9. This was 72nd percentile for age-, race-, and sex-matched controls. RECOMMENDATIONS: Coronary artery calcium (CAC) score is a strong predictor of incident coronary heart disease (CHD) and provides predictive information beyond traditional risk factors. CAC scoring is reasonable to use in the decision to withhold, postpone, or initiate statin therapy in intermediate-risk or selected borderline-risk asymptomatic adults (age 57-75 years and LDL-C >=70 to <190 mg/dL) who do not have diabetes or established atherosclerotic cardiovascular disease (ASCVD).* In intermediate-risk (10-year ASCVD risk >=7.5% to <20%) adults or selected borderline-risk (10-year ASCVD risk >=5% to <7.5%) adults in whom a CAC score is measured for the purpose of making a treatment decision the following recommendations have been made: If CAC=0, it is reasonable to withhold statin therapy and reassess in 5 to 10 years, as long as higher risk conditions are absent (diabetes mellitus, family history of premature CHD in first degree relatives (males <55 years; females <65 years), cigarette smoking, or LDL >=190 mg/dL). If CAC is 1 to 99, it is reasonable to initiate statin therapy for patients >=24 years of age. If CAC is >=100 or >=75th percentile, it is reasonable to initiate statin therapy at any age. Cardiology referral should be considered for patients with CAC scores >=400 or >=75th percentile. *2018 AHA/ACC/AACVPR/AAPA/ABC/ACPM/ADA/AGS/APhA/ASPC/NLA/PCNA Guideline on the Management of Blood Cholesterol: A Report of the American College of Cardiology/American Heart Association Task Force on Clinical Practice Guidelines. J Am Coll Cardiol. 2019;73(24):3168-3209. Armanda Magic,  MD Electronically Signed   By:  Fransico Him M.D.   On: 02/22/2021 23:25   Result Date: 02/22/2021 EXAM: OVER-READ INTERPRETATION  CT CHEST The following report is an over-read performed by radiologist Dr. Abigail Miyamoto of [facility], [place] on 02/22/2021. This over-read does not include interpretation of cardiac or coronary anatomy or pathology. The calcium score interpretation by the cardiologist is attached. COMPARISON:  Plain film 12/19/2020.  CTA chest 04/28/2019 FINDINGS: Vascular: Aortic atherosclerosis. Mediastinum/Nodes: No imaged thoracic adenopathy. Small hiatal hernia. Lungs/Pleura: No pleural fluid. Mild centrilobular emphysema. Areas of peripheral predominant architectural distortion and traction bronchiectasis are most consistent with post COVID-19 fibrosis when correlated with 04/28/2019 CT. Upper Abdomen: Moderate hepatic steatosis. Normal imaged portions of the spleen. Musculoskeletal: No acute osseous abnormality. Mild S shaped thoracolumbar spine curvature and cervical spine fixation identified on scout view. IMPRESSION: 1. No acute findings in the imaged extracardiac chest. 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 3. Hepatic steatosis 4. Small hiatal hernia. 5. Aortic Atherosclerosis (ICD10-I70.0). Electronically Signed: By: Abigail Miyamoto M.D. On: 02/22/2021 10:32   DG Abd Portable 1V  Result Date: 03/05/2021 CLINICAL DATA:  Nasogastric tube placement EXAM: PORTABLE ABDOMEN - 1 VIEW COMPARISON:  Yesterday FINDINGS: Shorter enteric tube with tip at the stomach and side-port over the lower esophagus. For the side port to reach the stomach would require approximately 7 cm of advancement. Partial coverage of the abdomen shows continued small bowel distension centrally. Clear lung bases. IMPRESSION: Shorter enteric tube with tip at the stomach and side port at the lower esophagus. Electronically Signed   By: Jorje Guild M.D.   On: 03/05/2021 05:53   DG Abd Portable 1V-Small Bowel Obstruction Protocol-initial, 8 hr  delay  Result Date: 03/04/2021 CLINICAL DATA:  Small bowel obstruction. EXAM: PORTABLE ABDOMEN - 1 VIEW COMPARISON:  Abdominal radiograph dated 03/04/2021. FINDINGS: Enteric tube with tip in the distal stomach. Persistent dilatation of small-bowel loops measuring up to 3.7 cm in caliber. There is a rounded lucency in the left lower abdomen which is only seen on 1 of the images and may be artifactual. Free air is less likely but not excluded. Lateral decubitus images recommended for better evaluation. Degenerative changes of the spine. No acute osseous pathology. IMPRESSION: 1. Enteric tube with tip in the distal stomach. 2. Persistent dilatation of small-bowel loops. 3. Artifact versus less likely free air. Further evaluation with lateral decubitus radiographs recommended. Electronically Signed   By: Anner Crete M.D.   On: 03/04/2021 22:14   DG Abd Portable 1V  Result Date: 03/04/2021 CLINICAL DATA:  Nasogastric tube placement. EXAM: PORTABLE ABDOMEN - 1 VIEW COMPARISON:  March 04, 2021 (1:35 p.m.) FINDINGS: A nasogastric tube is seen with its distal tip overlying the region near the gastric antrum. Multiple dilated small bowel loops are again noted. No radio-opaque calculi or other significant radiographic abnormality are seen. IMPRESSION: Nasogastric tube positioning, as described above. Electronically Signed   By: Virgina Norfolk M.D.   On: 03/04/2021 19:52   DG Abd Portable 1V-Small Bowel Protocol-Position Verification  Result Date: 03/04/2021 CLINICAL DATA:  NG tube placement EXAM: PORTABLE ABDOMEN - 1 VIEW COMPARISON:  Same day CT. FINDINGS: There is a nasogastric tube with tip and side port overlying the stomach. Multiple dilated loops of small bowel in the upper abdomen. IMPRESSION: Nasogastric tube tip and side port overlie the stomach. Small bowel obstruction. Electronically Signed   By: Maurine Simmering M.D.   On: 03/04/2021 10:31   [place] EKG SITE RITE  Result Date: 03/11/2021 If Site  Rite image not attached, placement could not be confirmed due to current cardiac rhythm.   I spent more than 80  minutes for this patient encounter including review of prior medical records/discussing diagnostics and treatment plan with the patient/family/coordinate care with primary/other specialits with greater than 50% of time in face to face encounter.   Electronically signed by:   Rosiland Oz, MD Infectious Disease Physician [facility] Pager: [phone]

## 2021-03-18 NOTE — Progress Notes (Addendum)
PROGRESS NOTE  Daniel Perkins [id number] DOB: Dec 06, 1955   PCP: Roselee Nova, MD  Patient is from: Home  DOA: 03/04/2021 LOS: 5  Chief complaints:  Chief Complaint  Patient presents with   Abdominal Pain     Brief Narrative / Interim history: 65 year old M with PMH of CAD, PAD, DM-2, HTN and HLD presenting with abdominal pain and found to have SBO for which she underwent ex lap with small bowel perforation repair on 03/05/2021.  Postop course complicated by persistent ileus requiring NG tube followed by TPN.  CT abdomen and pelvis on 03/10/2021 with nonspecific thickened and dilated small bowel, possible ileus/partial SBO and small pelvic fluid collection that was not amenable to drainage.  Patient was started on IV Zosyn on 11/3. Currently on FL D, TPN and IV Zosyn.  ID consulted for guidance on his antibiotics.   Therapy recommended CIR.   Subjective: Seen and examined earlier this morning.  No major events overnight of this morning.  Had a small BM last night.  Passing gas.  Reports ambulating in the hallway 3 times yesterday.  Feet is not hurting anymore.  Objective: Vitals:   03/17/21 1622 03/17/21 1950 03/18/21 0407 03/18/21 0700  BP: (!) 141/83 106/69 97/66 103/72  Pulse: 98 85 76 78  Resp: 17 20 17  (!) 22  Temp: 98.2 F (36.8 C) 98.4 F (36.9 C) 98 F (36.7 C) 98 F (36.7 C)  TempSrc: Oral Oral Oral Oral  SpO2: 98% 100% 94% 96%  Weight:      Height:        Intake/Output Summary (Last 24 hours) at 03/18/2021 1436 Last data filed at 03/18/2021 1100 Gross per 24 hour  Intake 2349.79 ml  Output 625 ml  Net 1724.79 ml   Filed Weights   03/04/21 0256 03/11/21 1400 03/15/21 0411  Weight: 94.8 kg 88.8 kg 87.4 kg    Examination:  GENERAL: No apparent distress.  Nontoxic. HEENT: MMM.  Vision and hearing grossly intact.  NECK: Supple.  No apparent JVD.  RESP: 96% on RA.  No IWOB.  Fair aeration bilaterally. CVS:  RRR. Heart sounds normal.   ABD/GI/GU: BS+. Abd soft, NTND but somewhat uncomfortable.  Staples in place. MSK/EXT:  Moves extremities. No apparent deformity. No edema.  SKIN: no apparent skin lesion or wound NEURO: Awake and alert. Oriented appropriately.  No apparent focal neuro deficit. PSYCH: Calm. Normal affect.   Procedures:  03/05/2021-diagnostic laparoscopy, lysis of single addition band in LLQ with resolution of SBO, mini laparotomy with primary repair of 2 mm small bowel perforation by Dr. Kae Heller  Microbiology summarized: COVID-19 and influenza PCR nonreactive. MRSA PCR nonreactive.  Assessment & Plan: SBO due to adhesion s/p laparoscopy, lysis of adhesion and mini laparotomy for small perf repair Persistent postoperative ileus-no BM yet but flatus. Possible enteritis/pelvic abscess in the setting of the above -CT A/P with small bowel wall thickening and mesenteric edema with dilated bowel proximally raising concern for possible enteritis, inflammatory process or ischemic changes, and new enhancing small fluid collection in pelvis measuring 3.0 x 3.1 x 4.2 cm. -CRP elevated to 9.5.  Leukocytosis improving. -General surgery weaning of TPN. -ID consulted appreciate recsa -IR evaluation for possible aspiration to help with source control and fluid GS/culture -Continue IV Zosyn 11/3>> -Advance to soft diet by general surgery -Mobilize patient.  Controlled NIDDM-2 with hyperglycemia: A1c 6.0% on 10/27.  On Victoza, Jardiance and glipizide alone. Recent Labs  Lab 03/17/21 1953 03/17/21 2359 03/18/21 0405 03/18/21  0810 03/18/21 1151  GLUCAP 208* 182* 226* 152* 161*  -Continue holding home meds -Continue SSI-resistance -Increase basal insulin from 10 to 15 units daily -History of statin intolerance.  AKI/azotemia: Prerenal from soft BP?  Not on nephrotoxic meds except IV Zosyn. Recent Labs    03/09/21 0155 03/10/21 0149 03/11/21 0045 03/12/21 0340 03/13/21 0412 03/14/21 0800 03/15/21 0337  03/16/21 0802 03/17/21 0313 03/18/21 0327  BUN 15 15 15 13 13 16 17 17 19  31*  CREATININE 1.04 0.97 1.01 0.98 0.91 0.91 0.90 0.92 0.88 1.25*  -Decreased Coreg -Recheck in the morning  Essential hypertension: Normotensive but soft. -Decrease Coreg to 6.25 mg twice daily -Hold home ARB   Bilateral feet pain: has history of gout but uric acid normal.  History of neuropathy.  Pain resolved. -Continue with  voltaren gel, colchicine and gabapentin. =   History of CAD: No chest pain or anginal symptoms. -Resume home Coreg and aspirin  Hyperlipidemia: Has history of statin intolerance. -Could benefit from PCSK9 inhibitors  AKI: Resolved.  Hyponatremia: Dehydration? Na 130 but corrects to 132 for hyperglycemia. -Continue monitoring  Microcytic anemia: H&H relatively stable.  Anemia panel basically normal. Recent Labs    03/09/21 0820 03/10/21 0149 03/11/21 0045 03/12/21 0340 03/13/21 0412 03/14/21 0319 03/15/21 0337 03/16/21 0416 03/17/21 0313 03/18/21 0327  HGB 12.6* 12.2* 11.6* 11.1* 11.4* 11.5* 11.2* 11.6* 11.5* 10.5*  -Continue monitoring  Leukocytosis/bandemia: Likely due to #1.  Improved -Continue monitoring -Antibiotics as above  Physical deconditioning -Therapy recommended CIR.  Moderate malnutrition Body mass index is 30.18 kg/m. Nutrition Problem: Moderate Malnutrition Etiology: acute illness (Bowel obstruction) Signs/Symptoms: mild fat depletion, mild muscle depletion, energy intake < or equal to 50% for > or equal to 5 days Interventions: Ensure Enlive (each supplement provides 350kcal and 20 grams of protein), TPN   DVT prophylaxis:  heparin injection 5,000 Units Start: 03/12/21 1645 Place and maintain sequential compression device Start: 03/10/21 1036  Code Status: Full code Family Communication: Patient and/or RN. Available if any question.  Level of care: Med-Surg Status is: Inpatient  Remains inpatient appropriate because: Possible enteritis  requiring IV antibiotics, persistent postop ileus requiring TPN and safe disposition/CIR       Consultants:  General surgery Infectious disease Interventional radiology   Sch Meds:  Scheduled Meds:  carvedilol  12.5 mg Oral BID AC   Chlorhexidine Gluconate Cloth  6 each Topical Daily   colchicine  0.6 mg Oral Daily   diclofenac Sodium  2 g Topical QID   feeding supplement  237 mL Oral TID BM   gabapentin  100 mg Oral BID   heparin injection (subcutaneous)  5,000 Units Subcutaneous Q8H   insulin aspart  0-20 Units Subcutaneous Q4H   insulin glargine-yfgn  15 Units Subcutaneous Daily   lidocaine  1 patch Transdermal Q24H   melatonin  6 mg Oral QHS   pantoprazole (PROTONIX) IV  40 mg Intravenous Q12H   sodium chloride flush  10-40 mL Intracatheter Q12H   Continuous Infusions:  methocarbamol (ROBAXIN) IV     piperacillin-tazobactam (ZOSYN)  IV 3.375 g (03/18/21 0821)   TPN ADULT (ION) 70 mL/hr at 03/17/21 1749   TPN ADULT (ION)     PRN Meds:.acetaminophen, hydrALAZINE, HYDROmorphone (DILAUDID) injection, lip balm, methocarbamol (ROBAXIN) IV, morphine injection, nitroGLYCERIN, ondansetron (ZOFRAN) IV, oxyCODONE, phenol, sodium chloride flush  Antimicrobials: Anti-infectives (From admission, onward)    Start     Dose/Rate Route Frequency Ordered Stop   03/11/21 0900  piperacillin-tazobactam (ZOSYN) IVPB  3.375 g        3.375 g 12.5 mL/hr over 240 Minutes Intravenous Every 8 hours 03/11/21 0815     03/05/21 0845  cefoTEtan (CEFOTAN) 2 g in sodium chloride 0.9 % 100 mL IVPB        2 g 200 mL/hr over 30 Minutes Intravenous To [facility] 03/04/21 2206 03/05/21 1035        I have personally reviewed the following labs and images: CBC: Recent Labs  Lab 03/14/21 0319 03/15/21 0337 03/16/21 0416 03/17/21 0313 03/18/21 0327  WBC 12.7* 12.4* 10.8* 12.6* 11.7*  NEUTROABS 8.2* 7.9* 7.2 8.1* 7.4  HGB 11.5* 11.2* 11.6* 11.5* 10.5*  HCT 36.2* 35.1* 36.7* 36.9* 34.3*   MCV 68.7* 68.8* 70.4* 70.8* 70.6*  PLT 433* 452* 481* 497* 467*   BMP &GFR Recent Labs  Lab 03/12/21 0340 03/13/21 0412 03/14/21 0800 03/15/21 0337 03/16/21 0802 03/17/21 0313 03/18/21 0327  NA 136 134* 135 133* 130* 130* 131*  K 3.7 4.2 4.5 4.5 5.0 4.6 4.9  CL 104 104 104 101 99 97* 102  CO2 25 24 24 25 25 24 24   GLUCOSE 126* 166* 160* 165* 218* 199* 202*  BUN 13 13 16 17 17 19  31*  CREATININE 0.98 0.91 0.91 0.90 0.92 0.88 1.25*  CALCIUM 8.6* 8.8* 9.1 9.1 9.3 9.3 9.1  MG 2.2 2.1  --  2.1  --   --  2.1  PHOS 3.9 4.0  --  4.2  --   --  4.6   Estimated Creatinine Clearance: 63 mL/min (A) (by C-G formula based on SCr of 1.25 mg/dL (H)). Liver & Pancreas: Recent Labs  Lab 03/12/21 0340 03/15/21 0337 03/18/21 0327  AST 22 38 37  ALT 20 28 33  ALKPHOS 78 81 84  BILITOT 1.0 0.6 0.6  PROT 6.8 7.9 7.8  ALBUMIN 2.0* 2.2* 2.1*   No results for input(s): LIPASE, AMYLASE in the last 168 hours. No results for input(s): AMMONIA in the last 168 hours. Diabetic: No results for input(s): HGBA1C in the last 72 hours. Recent Labs  Lab 03/17/21 1953 03/17/21 2359 03/18/21 0405 03/18/21 0810 03/18/21 1151  GLUCAP 208* 182* 226* 152* 161*   Cardiac Enzymes: No results for input(s): CKTOTAL, CKMB, CKMBINDEX, TROPONINI in the last 168 hours. No results for input(s): PROBNP in the last 8760 hours. Coagulation Profile: No results for input(s): INR, PROTIME in the last 168 hours. Thyroid Function Tests: No results for input(s): TSH, T4TOTAL, FREET4, T3FREE, THYROIDAB in the last 72 hours. Lipid Profile: No results for input(s): CHOL, HDL, LDLCALC, TRIG, CHOLHDL, LDLDIRECT in the last 72 hours.  Anemia Panel: Recent Labs    03/18/21 0327  VITAMINB12 784  FOLATE 10.4  FERRITIN 476*  TIBC 279  IRON 29*  RETICCTPCT 1.2   Urine analysis:    Component Value Date/Time   COLORURINE YELLOW 03/04/2021 0300   APPEARANCEUR CLEAR 03/04/2021 0300   APPEARANCEUR Clear 01/12/2016  1634   LABSPEC 1.020 03/04/2021 0300   PHURINE 5.0 03/04/2021 0300   GLUCOSEU >=500 (A) 03/04/2021 0300   HGBUR NEGATIVE 03/04/2021 0300   BILIRUBINUR NEGATIVE 03/04/2021 0300   BILIRUBINUR negative 12/06/2018 1659   BILIRUBINUR Negative 01/12/2016 1634   KETONESUR 20 (A) 03/04/2021 0300   PROTEINUR NEGATIVE 03/04/2021 0300   UROBILINOGEN 2.0 (A) 12/06/2018 1659   UROBILINOGEN 1 10/23/2013 1555   NITRITE NEGATIVE 03/04/2021 0300   LEUKOCYTESUR NEGATIVE 03/04/2021 0300   Sepsis Labs: Invalid input(s): PROCALCITONIN, [place]  Microbiology: No  results found for this or any previous visit (from the past 240 hour(s)).  Radiology Studies: No results found.     Mounir Skipper T. Lagro  If 7PM-7AM, please contact night-coverage [email or web address] 03/18/2021, 2:36 PM

## 2021-03-18 NOTE — Progress Notes (Signed)
Inpatient Rehab Admissions Coordinator:   Still awaiting determination on CIR request from insurance.  Will follow.   Estill Dooms, PT, DPT Admissions Coordinator [phone] 03/18/21  11:12 AM

## 2021-03-18 NOTE — Progress Notes (Signed)
PHARMACY - TOTAL PARENTERAL NUTRITION CONSULT NOTE  Indication: Prolonged ileus  Patient Measurements: Height: 5\' 7"  (170.2 cm) Weight: 87.4 kg (192 lb 10.9 oz) (standing) IBW/kg (Calculated) : 66.1 TPN AdjBW (KG): 71.8 Body mass index is 30.18 kg/m.  Assessment:  [name] presented on 10/27 with acute abdominal pain, nausea and vomiting after dinner. CT showed SBO and patient underwent ex-lap with lysis of single LLQ adhesion and repair of small bowel perforation on 10/28.  NG tube clamped and clears started 11/01.  Subsequently had nausea and lower abdominal pain, so NG tube switched back to LIWS.  Repeat CT on 11/02 showed nonspecific thickened and dilated small bowel, possible ileus or SBO with a small pelvic fluid collection. Patient has had inadequate nutrition for 7 days, so Pharmacy was consulted for TPN management.  Patient reported that he typically eats 3 meals a day.  He loves steak and chicken; eats canned vegetables and fruits.  He weighed 237 lbs earlier in the year, lost ~30 lbs over the summer.  Per conversation with surgery, he is tolerating full liquid diet with no N/V but no bowel movement yet. Diet advanced to full yesterday. Per patient, eating full breakfast (eggs, biscuit, sausage) and tolerating well.   Glucose / Insulin: CBGs 177-238, Glargine 15units to start 11/10 QD, used 36 units SSI in past 24 hrs and 45 units insulin in TPN - DM on glipizide, Victoza and Jardiance PTA (all on hold), A1c 6% Electrolytes: Na 131, K 4.9, Cl 102, CoCa 10.6, other electrolytes wnl.  Hepatic: 11/10 LFTs / tbili WNL; Alb 2.1, 11/07 TGs 118  Intake / Output; MIVF: UOP 0.48 ml/kg/hr, NG removed, LBM charted 10/25 PTA (per patient, x1 small BM 11/09 PM) GI Imaging: none since TPN GI Surgeries / Procedures: none since TPN  Central access: PICC placed 03/11/21 TPN start date: 03/11/21  Nutritional Goals: RD assessment 11/8: Estimated Needs Total Energy Estimated Needs: 2100-2300  kcals Total Protein Estimated Needs: 120-135 g Total Fluid Estimated Needs: >/= 2 L  Current Nutrition:  Full liquids and Regular diet   Ensure Enlive x3/day - 3 charted as given yesterday   Plan:  Continue TPN at decreased rate of 70 ml/hr (goal rate 90 ml/hr) per discussion with surgery. This will provide 98 g AA and 1620 kcal meeting ~77% of goal needs  Electrolytes in TPN: Continue Na 150 mEq/L, Ca 3 mEq/L, Decrease K to 30 mEq/L, Mg 5 mEq/L, Phos 15 mmol/L. Adjust Cl:Ac to 2:1.  Add standard MVI and trace elements to TPN Continue resistant SSI Q4H in addition to continuing 45 units regular insulin in TPN and adding Lantus 15 units QD Standard TPN labs and nursing care orders, check CMP, Mg and Phos tomorrow    Thank you for allowing pharmacy to be a part of this patient's care.  13/8, PharmD Clinical Pharmacist

## 2021-03-19 ENCOUNTER — Inpatient Hospital Stay (HOSPITAL_COMMUNITY): Payer: Medicare Other

## 2021-03-19 DIAGNOSIS — E440 Moderate protein-calorie malnutrition: Secondary | ICD-10-CM | POA: Diagnosis not present

## 2021-03-19 DIAGNOSIS — K56609 Unspecified intestinal obstruction, unspecified as to partial versus complete obstruction: Secondary | ICD-10-CM | POA: Diagnosis not present

## 2021-03-19 DIAGNOSIS — M1A9XX0 Chronic gout, unspecified, without tophus (tophi): Secondary | ICD-10-CM | POA: Diagnosis not present

## 2021-03-19 DIAGNOSIS — E785 Hyperlipidemia, unspecified: Secondary | ICD-10-CM | POA: Diagnosis not present

## 2021-03-19 LAB — GLUCOSE, CAPILLARY
Glucose-Capillary: 129 mg/dL — ABNORMAL HIGH (ref 70–99)
Glucose-Capillary: 141 mg/dL — ABNORMAL HIGH (ref 70–99)
Glucose-Capillary: 144 mg/dL — ABNORMAL HIGH (ref 70–99)
Glucose-Capillary: 170 mg/dL — ABNORMAL HIGH (ref 70–99)
Glucose-Capillary: 173 mg/dL — ABNORMAL HIGH (ref 70–99)
Glucose-Capillary: 177 mg/dL — ABNORMAL HIGH (ref 70–99)
Glucose-Capillary: 187 mg/dL — ABNORMAL HIGH (ref 70–99)

## 2021-03-19 LAB — CBC WITH DIFFERENTIAL/PLATELET
Abs Immature Granulocytes: 0 10*3/uL (ref 0.00–0.07)
Basophils Absolute: 0.1 10*3/uL (ref 0.0–0.1)
Basophils Relative: 1 %
Eosinophils Absolute: 0.5 10*3/uL (ref 0.0–0.5)
Eosinophils Relative: 6 %
HCT: 34.4 % — ABNORMAL LOW (ref 39.0–52.0)
Hemoglobin: 10.9 g/dL — ABNORMAL LOW (ref 13.0–17.0)
Lymphocytes Relative: 22 %
Lymphs Abs: 2 10*3/uL (ref 0.7–4.0)
MCH: 22.2 pg — ABNORMAL LOW (ref 26.0–34.0)
MCHC: 31.7 g/dL (ref 30.0–36.0)
MCV: 69.9 fL — ABNORMAL LOW (ref 80.0–100.0)
Monocytes Absolute: 1.2 10*3/uL — ABNORMAL HIGH (ref 0.1–1.0)
Monocytes Relative: 13 %
Neutro Abs: 5.2 10*3/uL (ref 1.7–7.7)
Neutrophils Relative %: 58 %
Platelets: 454 10*3/uL — ABNORMAL HIGH (ref 150–400)
RBC: 4.92 MIL/uL (ref 4.22–5.81)
RDW: 14.7 % (ref 11.5–15.5)
WBC: 9 10*3/uL (ref 4.0–10.5)
nRBC: 0 % (ref 0.0–0.2)
nRBC: 0 /100 WBC

## 2021-03-19 LAB — RENAL FUNCTION PANEL
Albumin: 2.1 g/dL — ABNORMAL LOW (ref 3.5–5.0)
Anion gap: 6 (ref 5–15)
BUN: 23 mg/dL (ref 8–23)
CO2: 23 mmol/L (ref 22–32)
Calcium: 9 mg/dL (ref 8.9–10.3)
Chloride: 103 mmol/L (ref 98–111)
Creatinine, Ser: 0.86 mg/dL (ref 0.61–1.24)
GFR, Estimated: >60 mL/min (ref >60)
Glucose, Bld: 170 mg/dL — ABNORMAL HIGH (ref 70–99)
Phosphorus: 3.7 mg/dL (ref 2.5–4.6)
Potassium: 4.6 mmol/L (ref 3.5–5.1)
Sodium: 132 mmol/L — ABNORMAL LOW (ref 135–145)

## 2021-03-19 MED ORDER — IOHEXOL 300 MG/ML  SOLN
100.0000 mL | Freq: Once | INTRAMUSCULAR | Status: AC | PRN
  Administered 2021-03-19: 100 mL via INTRAVENOUS

## 2021-03-19 MED ORDER — IOHEXOL 9 MG/ML PO SOLN
ORAL | Status: AC
  Administered 2021-03-19: 500 mL
  Filled 2021-03-19: qty 1000

## 2021-03-19 MED ORDER — TRAVASOL 10 % IV SOLN
INTRAVENOUS | Status: AC
  Filled 2021-03-19: qty 626.4

## 2021-03-19 NOTE — Progress Notes (Signed)
Inpatient Rehab Admissions Coordinator:   Notified by insurance that patient does not appear to be stable enough to discharge to CIR.  Will start expedited appeal.  I spoke to his daughter over the phone today to provide an update.    Estill Dooms, PT, DPT Admissions Coordinator [phone] 03/19/21  12:12 PM

## 2021-03-19 NOTE — Progress Notes (Signed)
Patient ID: Daniel Perkins, male   DOB: 23-Nov-1955, 65 y.o.   MRN: [id number] [facility] Surgery Progress Note  14 Days Post-Op  Subjective: In good spirits this am. Still passing flatus but last BM 2 days ago. Tolerating soft diet well though he states he gets full quickly. No nausea or emesis. Ambulating more as foot pain continues to improve  Objective: Vital signs in last 24 hours: Temp:  [97.5 F (36.4 C)-98.1 F (36.7 C)] 97.5 F (36.4 C) (11/11 0743) Pulse Rate:  [79-87] 79 (11/11 0743) Resp:  [17-18] 18 (11/11 0743) BP: (98-135)/(66-87) 135/87 (11/11 0743) SpO2:  [92 %-98 %] 92 % (11/11 0743) Last BM Date: 03/17/21  Intake/Output from previous day: 11/10 0701 - 11/11 0700 In: 2412.7 [P.O.:880; I.V.:1331.6; IV Piggyback:201.1] Out: 1855 [Urine:1855] Intake/Output this shift: Total I/O In: -  Out: 250 [Urine:250]  PE: Gen:  Alert, NAD, pleasant Card:  RRR Pulm: CTAB, normal effort on room air Abd: soft, no distension, +BS, very minimal TTP near incisions, lap incisions and midline c/d/i with staples present and no spreading erythema or drainage MSK: no calf edema or TTP bilaterally Skin: warm and dry  Lab Results:  Recent Labs    03/18/21 0327 03/19/21 0414  WBC 11.7* 9.0  HGB 10.5* 10.9*  HCT 34.3* 34.4*  PLT 467* 454*    BMET Recent Labs    03/18/21 0327 03/19/21 0414  NA 131* 132*  K 4.9 4.6  CL 102 103  CO2 24 23  GLUCOSE 202* 170*  BUN 31* 23  CREATININE 1.25* 0.86  CALCIUM 9.1 9.0    PT/INR No results for input(s): LABPROT, INR in the last 72 hours. CMP     Component Value Date/Time   NA 132 (L) 03/19/2021 0414   NA 137 02/10/2021 1538   K 4.6 03/19/2021 0414   CL 103 03/19/2021 0414   CO2 23 03/19/2021 0414   GLUCOSE 170 (H) 03/19/2021 0414   BUN 23 03/19/2021 0414   BUN 13 02/10/2021 1538   CREATININE 0.86 03/19/2021 0414   CREATININE 1.22 06/30/2014 1634   CALCIUM 9.0 03/19/2021 0414   PROT 7.8 03/18/2021 0327    PROT 7.2 12/06/2018 1648   ALBUMIN 2.1 (L) 03/19/2021 0414   ALBUMIN 4.2 12/06/2018 1648   AST 37 03/18/2021 0327   ALT 33 03/18/2021 0327   ALKPHOS 84 03/18/2021 0327   BILITOT 0.6 03/18/2021 0327   BILITOT 0.3 12/06/2018 1648   GFRNONAA >60 03/19/2021 0414   GFRNONAA 78 10/23/2013 1554   GFRAA 86 06/09/2020 1508   GFRAA >89 10/23/2013 1554   Lipase  No results found for: LIPASE     Studies/Results: No results found.  Anti-infectives: Anti-infectives (From admission, onward)    Start     Dose/Rate Route Frequency Ordered Stop   03/18/21 2200  cefTRIAXone (ROCEPHIN) 2 g in sodium chloride 0.9 % 100 mL IVPB        2 g 200 mL/hr over 30 Minutes Intravenous Every 24 hours 03/18/21 1514     03/18/21 2200  metroNIDAZOLE (FLAGYL) tablet 500 mg        500 mg Oral Every 12 hours 03/18/21 1514     03/11/21 0900  piperacillin-tazobactam (ZOSYN) IVPB 3.375 g  Status:  Discontinued        3.375 g 12.5 mL/hr over 240 Minutes Intravenous Every 8 hours 03/11/21 0815 03/18/21 1514   03/05/21 0845  cefoTEtan (CEFOTAN) 2 g in sodium chloride 0.9 % 100 mL IVPB  2 g 200 mL/hr over 30 Minutes Intravenous To [facility] 03/04/21 2206 03/05/21 1035        Assessment/Plan SBO with no previous abdominal surgical history  POD#14 S/P diagnostic laparoscopy, lysis of single adhesive band in the left lower quadrant with resolution of bowel obstruction, mini laparotomy with primary repair of 2 mm small bowel perforation on 03/05/21 Dr. Fredricka Bonine - CT scan 11/2 with some nonspecific thickened and dilated small bowel, possible ileus or partial SBO; small pelvic fluid collection - Fluid collection on CT small and not amenable to drainage. Continue zosyn - tolerated soft diet. Continue TPN and follow PO intake ~ weaning - Mobilize. Working with PT/OT who are currently recommending CIR. - WBC 9.0 (11.7), afebrile.  - per ID repeat CT today. He is currently progressing without worsening  leukocytosis/fevers - staples out at discharge   Doing well surgically. Hopefully stable for discharge to CIR soon as PO intake improves  ID - cefotetan periop 10/28, zosyn 11/3>> VTE - SCDs, sq heparin FEN - IVF, soft, ensure tid, TPN 50% Foley - none   DM HTN CAD h/o MI several years ago - followed by Dr. Eldridge Dace PAD HLD Chronic neck pain   LOS: 15 days    Eric Form, [facility] Surgery 03/19/2021, 8:04 AM Please see Amion for pager number during day hours 7:00am-4:30pm

## 2021-03-19 NOTE — Progress Notes (Signed)
PHARMACY - TOTAL PARENTERAL NUTRITION CONSULT NOTE  Indication: Prolonged ileus  Patient Measurements: Height: 5\' 7"  (170.2 cm) Weight: 87.4 kg (192 lb 10.9 oz) (standing) IBW/kg (Calculated) : 66.1 TPN AdjBW (KG): 71.8 Body mass index is 30.18 kg/m.  Assessment:  [name] presented on 10/27 with acute abdominal pain, nausea and vomiting after dinner. CT showed SBO and patient underwent ex-lap with lysis of single LLQ adhesion and repair of small bowel perforation on 10/28.  NG tube clamped and clears started 11/01.  Subsequently had nausea and lower abdominal pain, so NG tube switched back to LIWS.  Repeat CT on 11/02 showed nonspecific thickened and dilated small bowel, possible ileus or SBO with a small pelvic fluid collection. Patient has had inadequate nutrition for 7 days, so Pharmacy was consulted for TPN management.  Patient reported that he typically eats 3 meals a day.  He loves steak and chicken; eats canned vegetables and fruits.  He weighed 237 lbs earlier in the year, lost ~30 lbs over the summer.  Per conversation with surgery, he is tolerating full liquid diet with no N/V but no bowel movement yet. Diet advanced to full yesterday. Per patient, eating full breakfast (eggs, biscuit, sausage) and tolerating well.   Glucose / Insulin: CBGs 139-187, Glargine 15units to start 11/10 QD, used 22 units SSI in past 24 hrs and 45 units insulin in TPN - DM on glipizide, Victoza and Jardiance PTA (all on hold), A1c 6% Electrolytes: Na 132, K 4.6, Cl 103, CoCa 10.5, other electrolytes wnl Hepatic: 11/10 LFTs / tbili WNL; Alb 2.1, 11/07 TGs 118  Intake / Output; MIVF: UOP 0.70 ml/kg/hr, NG removed, LBM 11/09  GI Imaging: none since TPN GI Surgeries / Procedures: none since TPN  Central access: PICC placed 03/11/21 TPN start date: 03/11/21  Nutritional Goals: RD assessment 11/08: Estimated Needs Total Energy Estimated Needs: 2100-2300 kcals Total Protein Estimated Needs: 120-135  g Total Fluid Estimated Needs: >/= 2 L  Current Nutrition:  Full liquids and Regular diet   Ensure Enlive x3/day - 3 charted as given yesterday  Eating solids and majority of his meals  Plan:  Decrease TPN to a rate of 45 ml/hr (50% of goal rate 90 ml/hr) per discussion with surgery. This will provide 63 g AA and 1040 kcal meeting, ~50% of goal needs  Electrolytes in TPN: Continue Na 150 mEq/L, Ca 3 mEq/L, K 30 mEq/L, Mg 5 mEq/L, Phos 15 mmol/L. Adjust Cl:Ac to 2:1.  Add standard MVI and trace elements to TPN Continue resistant SSI Q4H in addition to decreasing regular insulin in TPN to 20 units and adding Lantus 15 units QD Standard TPN labs and nursing care orders, check CMP, Mg and Phos tomorrow    Thank you for allowing pharmacy to be a part of this patient's care.  13/08, PharmD Clinical Pharmacist

## 2021-03-19 NOTE — Progress Notes (Addendum)
[facility] Infectious Diseases Follow Up Note  Patient Identification: Patient Name: Daniel Perkins MRN: [id number] [place] Date: 03/04/2021  2:51 AM Age: 65 y.o.Today's Date: 03/19/2021   Reason for Visit: pelvic abscess  Active Problems:   Diabetes mellitus due to underlying condition, uncontrolled, with hyperglycemia ([place])   Hyperlipemia   HYPERTENSION   Gout   SBO (small bowel obstruction) ([facility])   Malnutrition of moderate degree  Antibiotics: Pip/tazo 11/3-11/10                    Ceftriaxone 11/10-current                    Metronidazole 11/10-current    Lines/Hardware: RT brachial picc   Interval Events: remains afebrile, leukocytosis has resolved. AKI has resolved    Assessment Pelvic abscess in the setting of below ( 3*3.1*4.2cm) SBO s/p diagnostic lap, lysis of adhesive band in the LLQ with resolution of bowel obstruction, mini laparotomy with primary repair of  92mm small bowel perforation on 03/05/21 On TPN via PICC -  weaning on TPN as PO intake improves  AKI resolved  DM   Recommendations Continue ceftriaxone and metronidazole Repeat CT abdomen/pelvis w contrast today to follow up on the abscess for resolution vs need to aspirate. IR will follow up on CT abdomen/pelvis and decide on need to aspirate. Please send for gram stain and aerobic/anaerobic cx if goes for aspiration or drainage  Monitor CBC, CMP  Discussed with patient and IR  Following  Rest of the management as per the primary team. Thank you for the consult. Please page with pertinent questions or concerns.  ______________________________________________________________________ Subjective patient seen and examined at the bedside. He is doing well. No complaints. Drinking oral contrast for CT abdomen/pelvis today. Passing flatus. Last BM 2 days ago.   Vitals BP 135/87 (BP Location: Left Arm)   Pulse 79   Temp (!) 97.5 F (36.4 C) (Oral)    Resp 18   Ht 5\' 7"  (1.702 m)   Wt 87.4 kg Comment: standing  SpO2 92%   BMI 30.18 kg/m     Physical Exam Constitutional:  Sitting up in bed, appears comfortable.     Comments:   Cardiovascular:     Rate and Rhythm: Normal rate and regular rhythm.     Heart sounds:   Pulmonary:     Effort: Pulmonary effort is normal.     Comments:   Abdominal:     Palpations: Abdomen is soft. Non tender and non distended     Tenderness: surgical site is healing well   Musculoskeletal:        General: No swelling or tenderness.   Skin:    Comments: No lesions or rashes   Neurological:     General: No focal deficit present.   Psychiatric:        Mood and Affect: Mood normal.   Pertinent Microbiology Results for orders placed or performed during the hospital encounter of 03/04/21  Resp Panel by RT-PCR (Flu A&B, Covid) Nasopharyngeal Swab     Status: None   Collection Time: 03/04/21  7:44 AM   Specimen: Nasopharyngeal Swab; Nasopharyngeal(NP) swabs in vial transport medium  Result Value Ref Range Status   SARS Coronavirus 2 by RT PCR NEGATIVE NEGATIVE Final    Comment: (NOTE) SARS-CoV-2 target nucleic acids are NOT DETECTED.  The SARS-CoV-2 RNA is generally detectable in upper respiratory specimens during the acute phase of infection. The lowest concentration of SARS-CoV-2 viral  copies this assay can detect is 138 copies/mL. A negative result does not preclude SARS-Cov-2 infection and should not be used as the sole basis for treatment or other patient management decisions. A negative result may occur with  improper specimen collection/handling, submission of specimen other than nasopharyngeal swab, presence of viral mutation(s) within the areas targeted by this assay, and inadequate number of viral copies(<138 copies/mL). A negative result must be combined with clinical observations, patient history, and epidemiological information. The expected result is Negative.  Fact Sheet  for Patients:  BloggerCourse.com  Fact Sheet for Healthcare Providers:  SeriousBroker.it  This test is no t yet approved or cleared by the [place] FDA and  has been authorized for detection and/or diagnosis of SARS-CoV-2 by FDA under an Emergency Use Authorization (EUA). This EUA will remain  in effect (meaning this test can be used) for the duration of the COVID-19 declaration under Section 564(b)(1) of the Act, 21 U.S.C.section 360bbb-3(b)(1), unless the authorization is terminated  or revoked sooner.       Influenza A by PCR NEGATIVE NEGATIVE Final   Influenza B by PCR NEGATIVE NEGATIVE Final    Comment: (NOTE) The Xpert Xpress SARS-CoV-2/FLU/RSV plus assay is intended as an aid in the diagnosis of influenza from Nasopharyngeal swab specimens and should not be used as a sole basis for treatment. Nasal washings and aspirates are unacceptable for Xpert Xpress SARS-CoV-2/FLU/RSV testing.  Fact Sheet for Patients: BloggerCourse.com  Fact Sheet for Healthcare Providers: SeriousBroker.it  This test is not yet approved or cleared by the [place] FDA and has been authorized for detection and/or diagnosis of SARS-CoV-2 by FDA under an Emergency Use Authorization (EUA). This EUA will remain in effect (meaning this test can be used) for the duration of the COVID-19 declaration under Section 564(b)(1) of the Act, 21 U.S.C. section 360bbb-3(b)(1), unless the authorization is terminated or revoked.  Performed at [facility], [address], [place], [place] [address]   Surgical pcr screen     Status: None   Collection Time: 03/05/21  2:58 AM   Specimen: Nasal Mucosa; Nasal Swab  Result Value Ref Range Status   MRSA, PCR NEGATIVE NEGATIVE Final   Staphylococcus aureus NEGATIVE NEGATIVE Final    Comment: (NOTE) The Xpert SA Assay (FDA approved for NASAL specimens in  patients 33 years of age and older), is one component of a comprehensive surveillance program. It is not intended to diagnose infection nor to guide or monitor treatment. Performed at [facility], [address], [place], [place] [address]      Pertinent Lab. CBC Latest Ref Rng & Units 03/19/2021 03/18/2021 03/17/2021  WBC 4.0 - 10.5 K/uL 9.0 11.7(H) 12.6(H)  Hemoglobin 13.0 - 17.0 g/dL 10.9(L) 10.5(L) 11.5(L)  Hematocrit 39.0 - 52.0 % 34.4(L) 34.3(L) 36.9(L)  Platelets 150 - 400 K/uL 454(H) 467(H) 497(H)   CMP Latest Ref Rng & Units 03/19/2021 03/18/2021 03/17/2021  Glucose 70 - 99 mg/dL 500(B) 704(U) 889(V)  BUN 8 - 23 mg/dL 23 69(I) 19  Creatinine 0.61 - 1.24 mg/dL 5.03 8.88(K) 8.00  Sodium 135 - 145 mmol/L 132(L) 131(L) 130(L)  Potassium 3.5 - 5.1 mmol/L 4.6 4.9 4.6  Chloride 98 - 111 mmol/L 103 102 97(L)  CO2 22 - 32 mmol/L 23 24 24   Calcium 8.9 - 10.3 mg/dL 9.0 9.1 9.3  Total Protein 6.5 - 8.1 g/dL - 7.8 -  Total Bilirubin 0.3 - 1.2 mg/dL - 0.6 -  Alkaline Phos 38 -  126 U/L - 84 -  AST 15 - 41 U/L - 37 -  ALT 0 - 44 U/L - 33 -    Pertinent Imaging today Plain films and CT images have been personally visualized and interpreted; radiology reports have been reviewed. Decision making incorporated into the Impression / Recommendations.  I spent more than 35 minutes for this patient encounter including review of prior medical records, coordination of care  with greater than 50% of time being face to face/counseling and discussing diagnostics/treatment plan with the patient/family.  Electronically signed by:   Odette Fraction, MD Infectious Disease Physician [facility] for Infectious Disease Pager: [phone]

## 2021-03-19 NOTE — Progress Notes (Signed)
Physical Therapy Treatment Patient Details Name: Daniel Perkins MRN: [id number] DOB: 1955-10-21 Today's Date: 03/19/2021   History of Present Illness Pt is 65 y/o M admitted to ED for acute abdominal pain on 03/04/21, found to have small bowel obstruction. He is S/p ex lap, small bowel perforation repair on 10/28. PMH includes CAD/MI, gout, CKD, HTN, DM and ACDF.    PT Comments    Patient progressing well towards PT goals. Requires less assist for bed mobility and transfers today. Improved ambulation distance with min guard assist and use of RW for support. Noted to have 2/4 DOE with activity and needing standing rest break due to fatigue. Assist needed to donn shoes and for safety with mobility. Session cut short as transport waiting to take pt down for CT scan. Encouraged walking with mobility tech later. Will follow.    Recommendations for follow up therapy are one component of a multi-disciplinary discharge planning process, led by the attending physician.  Recommendations may be updated based on patient status, additional functional criteria and insurance authorization.  Follow Up Recommendations  Acute inpatient rehab (3hours/day)     Assistance Recommended at Discharge Frequent or constant Supervision/Assistance  Equipment Recommendations  None recommended by PT    Recommendations for Other Services       Precautions / Restrictions Precautions Precautions: Other (comment) Precaution Comments: abdominal precautions Restrictions Weight Bearing Restrictions: No Other Position/Activity Restrictions: no lifting >10lbs     Mobility  Bed Mobility Overal bed mobility: Needs Assistance Bed Mobility: Rolling;Sidelying to Sit Rolling: Min guard Sidelying to sit: Min guard;HOB elevated       General bed mobility comments: Use of rail to get to EOB, no assist needed.    Transfers Overall transfer level: Needs assistance Equipment used: Rolling walker (2  wheels) Transfers: Sit to/from Stand Sit to Stand: Min guard           General transfer comment: Min guard for safety to stand from EOB x1.    Ambulation/Gait Ambulation/Gait assistance: Min guard Gait Distance (Feet): 150 Feet Assistive device: Rolling walker (2 wheels) Gait Pattern/deviations: Step-through pattern;Decreased stride length;Trunk flexed;Shuffle;Decreased dorsiflexion - right;Decreased stance time - right Gait velocity: decreased Gait velocity interpretation: <1.31 ft/sec, indicative of household ambulator   General Gait Details: Slow, mildly unsteady gait with flexed trunk and cues for heel strike on RLE. 2/4 DOE. 1 standing rest break.   Stairs             Wheelchair Mobility    Modified Rankin (Stroke Patients Only)       Balance Overall balance assessment: Needs assistance Sitting-balance support: Feet supported;No upper extremity supported Sitting balance-Leahy Scale: Fair Sitting balance - Comments: Assist to donn shoes sitting EOB due to discomfort in belly   Standing balance support: During functional activity Standing balance-Leahy Scale: Fair Standing balance comment: Does better with UE support for dynamic tasks/walking                            Cognition Arousal/Alertness: Awake/alert Behavior During Therapy: WFL for tasks assessed/performed Overall Cognitive Status: Within Functional Limits for tasks assessed                                          Exercises      General Comments General comments (skin integrity, edema, etc.): Visitor present during session.  Pertinent Vitals/Pain Pain Assessment: No/denies pain    Home Living                          Prior Function            PT Goals (current goals can now be found in the care plan section) Acute Rehab PT Goals Patient Stated Goal: to get better PT Goal Formulation: With patient Time For Goal Achievement:  04/02/21 Potential to Achieve Goals: Good Progress towards PT goals: Progressing toward goals    Frequency    Min 3X/week      PT Plan Current plan remains appropriate    Co-evaluation              AM-PAC PT "6 Clicks" Mobility   Outcome Measure  Help needed turning from your back to your side while in a flat bed without using bedrails?: A Little Help needed moving from lying on your back to sitting on the side of a flat bed without using bedrails?: A Little Help needed moving to and from a bed to a chair (including a wheelchair)?: A Little Help needed standing up from a chair using your arms (e.g., wheelchair or bedside chair)?: A Little Help needed to walk in hospital room?: A Little Help needed climbing 3-5 steps with a railing? : A Lot 6 Click Score: 17    End of Session Equipment Utilized During Treatment: Gait belt Activity Tolerance: Patient tolerated treatment well Patient left: in bed;with call bell/phone within reach;with family/visitor present;Other (comment) (transport present) Nurse Communication: Mobility status PT Visit Diagnosis: Unsteadiness on feet (R26.81);Muscle weakness (generalized) (M62.81);Difficulty in walking, not elsewhere classified (R26.2)     Time: 3664-4034 PT Time Calculation (min) (ACUTE ONLY): 18 min  Charges:  $Gait Training: 8-22 mins                     Vale Haven, PT, DPT Acute Rehabilitation Services Pager [phone] Office [phone]      Blake Divine A Lanier Ensign 03/19/2021, 11:47 AM

## 2021-03-19 NOTE — Progress Notes (Signed)
Patient ID: Daniel Perkins, male   DOB: 08/19/1955, 65 y.o.   MRN: [id number] F/u CT A/P reviewed by Dr. Elby Showers (IR) today. No role for additional asp/drainage of pelvic fluid collection due to decrease in size of collection . Please contact Dr. Elby Showers at [phone] or pager [phone].

## 2021-03-19 NOTE — Progress Notes (Addendum)
ID Brief Note  CT abdomen/pelvis findings noted  Interval decrease in size of small loculated fluid collection in the pelvis anterior to the rectum (2.8 cm).   New 3.3 cm loculated fluid collection in the subcutaneous plane in the anterior abdominal wall immediately superior to the umbilicus and posterior to the skin staples. This may suggest seroma or an abscess.    There is another smaller loculated fluid collection with fat fluid level in the epigastrium adjacent to a ventral hernia. Possibility of fat necrosis in this region is not excluded.  No plans for IR or surgical intervention  There is potential for patient to go to CIR  Recommendations  -Recommend to continue IV ceftriaxone and metronidazole as is given recent abdominal surgery and patient being on TPN ( concerns of absorption with PO abtx). Duration: 2 weeks from today  -Already has a PICC -Repeat CT abd/pelvis w contrast around end of 2 weeks ( 11/24) to follow up on the abscess or urgently with signs of sepsis.  -Call us back once CT is done to determine further duration of abtx/switch to PO abtx -ID will sign off at this time. Please call with questions.   Odette Fraction, MD Infectious Disease Physician [facility] for Infectious Disease [address] [place], [place] [address] Phone: [phone]  Fax: [phone]

## 2021-03-19 NOTE — Progress Notes (Signed)
PROGRESS NOTE  Daniel Perkins OHY:[id number] DOB: May 23, 1955   PCP: Ellyn Hack, MD  Patient is from: Home  DOA: 03/04/2021 LOS: 15  Chief complaints:  Chief Complaint  Patient presents with   Abdominal Pain     Brief Narrative / Interim history: 65 year old M with PMH of CAD, PAD, DM-2, HTN and HLD presenting with abdominal pain and found to have SBO for which she underwent ex lap with small bowel perforation repair on 03/05/2021.  Postop course complicated by persistent ileus requiring NG tube followed by TPN.  CT abdomen and pelvis on 03/10/2021 with nonspecific thickened and dilated small bowel, possible ileus/partial SBO and small pelvic fluid collection that was not amenable to drainage.  Patient was started on IV Zosyn on 11/3. Currently on FL D, TPN and IV Zosyn.  ID consulted for guidance on his antibiotics, and recommended repeat CT abdomen and pelvis and possible IR aspiration.  Antibiotics de-escalated to IV CTX and Flagyl.  Therapy recommended CIR but insurance declined after peer to peer and suggested reapplying once we have a clear plan on his IV antibiotics duration.   Subjective: Seen and examined earlier this morning.  No major events overnight of this morning.  Has not had bowel movement last night.  Passing gas.  He denies chest pain or dyspnea.  Reports ambulating about 50 yards in the hallway yesterday.   Objective: Vitals:   03/18/21 1500 03/18/21 1953 03/19/21 0408 03/19/21 0743  BP: 98/66 121/82 118/79 135/87  Pulse: 86 87 82 79  Resp: 18 17 18 18   Temp: 98.1 F (36.7 C) 98 F (36.7 C) 98.1 F (36.7 C) (!) 97.5 F (36.4 C)  TempSrc: Oral Oral Oral Oral  SpO2: 98% 93% 95% 92%  Weight:      Height:        Intake/Output Summary (Last 24 hours) at 03/19/2021 1155 Last data filed at 03/19/2021 0801 Gross per 24 hour  Intake 1911.7 ml  Output 1580 ml  Net 331.7 ml   Filed Weights   03/04/21 0256 03/11/21 1400 03/15/21 0411  Weight: 94.8 kg  88.8 kg 87.4 kg    Examination:  GENERAL: No apparent distress.  Nontoxic. HEENT: MMM.  Vision and hearing grossly intact.  NECK: Supple.  No apparent JVD.  RESP: 92% on RA.  No IWOB.  Fair aeration bilaterally. CVS:  RRR. Heart sounds normal.  ABD/GI/GU: BS+. Abd soft, NTND.  Staples in place. MSK/EXT:  Moves extremities.  Significant muscle mass and subcu fat loss in BLE. SKIN: no apparent skin lesion or wound NEURO: Awake and alert. Oriented appropriately.  No apparent focal neuro deficit. PSYCH: Calm. Normal affect.   Procedures:  03/05/2021-diagnostic laparoscopy, lysis of single addition band in LLQ with resolution of SBO, mini laparotomy with primary repair of 2 mm small bowel perforation by Dr. Fredricka Bonine  Microbiology summarized: COVID-19 and influenza PCR nonreactive. MRSA PCR nonreactive.  Assessment & Plan: SBO due to adhesion s/p laparoscopy, lysis of adhesion and mini laparotomy for small perf repair Persistent postoperative ileus-no BM yet but flatus. Possible enteritis/pelvic abscess in the setting of the above -CT A/P with small bowel wall thickening and mesenteric edema with dilated bowel proximally raising concern for possible enteritis, inflammatory process or ischemic changes, and new enhancing small fluid collection in pelvis measuring 3.0 x 3.1 x 4.2 cm. -CRP elevated to 9.5.  Leukocytosis resolved. -General surgery weaning of TPN. -ID consulted appreciate recs -Repeat CT A/P +/- IR eval for possible aspiration to  help with source control and fluid GS/culture -IV Zosyn 11/3>> CTX +Flagyl 11/11>> -On soft diet per general surgery. -Mobilize patient.  Controlled NIDDM-2 with hyperglycemia: A1c 6.0% on 10/27.  On Victoza, Jardiance and glipizide alone. Recent Labs  Lab 03/18/21 1951 03/19/21 0025 03/19/21 0406 03/19/21 0822 03/19/21 1148  GLUCAP 139* 187* 170* 173* 141*  -Continue holding home meds -Continue SSI-resistance -Continue basal insulin at 15  units daily -History of statin intolerance.  AKI/azotemia: Resolved. Recent Labs    03/10/21 0149 03/11/21 0045 03/12/21 0340 03/13/21 0412 03/14/21 0800 03/15/21 0337 03/16/21 0802 03/17/21 0313 03/18/21 0327 03/19/21 0414  BUN 15 15 13 13 16 17 17 19  31* 23  CREATININE 0.97 1.01 0.98 0.91 0.91 0.90 0.92 0.88 1.25* 0.86  -Monitor intermittently  Essential hypertension: Normotensive -Decreased Coreg to 6.25 mg twice daily on 11/10. -Continue holding home ARB.   Bilateral feet pain: has history of gout but uric acid normal.  History of neuropathy.  Pain resolved. -Continue with  voltaren gel, colchicine and gabapentin.    History of CAD: No chest pain or anginal symptoms. -Continue home Coreg and aspirin  Hyperlipidemia: Has history of statin intolerance. -Could benefit from PCSK9 inhibitors  Hyponatremia: Improved. -Continue monitoring  Microcytic anemia: H&H relatively stable.  Anemia panel basically normal. Recent Labs    03/10/21 0149 03/11/21 0045 03/12/21 0340 03/13/21 0412 03/14/21 0319 03/15/21 0337 03/16/21 0416 03/17/21 0313 03/18/21 0327 03/19/21 0414  HGB 12.2* 11.6* 11.1* 11.4* 11.5* 11.2* 11.6* 11.5* 10.5* 10.9*  -Continue monitoring  Leukocytosis/bandemia: Likely due to #1.  Resolved. -Continue monitoring -Antibiotics as above  Physical deconditioning -Continue PT/OT -Final disposition TBD.  Moderate malnutrition Body mass index is 30.18 kg/m. Nutrition Problem: Moderate Malnutrition Etiology: acute illness (Bowel obstruction) Signs/Symptoms: mild fat depletion, mild muscle depletion, energy intake < or equal to 50% for > or equal to 5 days Interventions: Ensure Enlive (each supplement provides 350kcal and 20 grams of protein), TPN   DVT prophylaxis:  heparin injection 5,000 Units Start: 03/12/21 1645 Place and maintain sequential compression device Start: 03/10/21 1036  Code Status: Full code Family Communication: Updated  patient's niece at bedside. Level of care: Med-Surg Status is: Inpatient  Remains inpatient appropriate because: Possible enteritis requiring IV antibiotics, persistent postop ileus requiring TPN and safe disposition/CIR       Consultants:  General surgery Infectious disease Interventional radiology   Sch Meds:  Scheduled Meds:  carvedilol  6.25 mg Oral BID AC   Chlorhexidine Gluconate Cloth  6 each Topical Daily   colchicine  0.6 mg Oral Daily   diclofenac Sodium  2 g Topical QID   feeding supplement  237 mL Oral TID BM   gabapentin  100 mg Oral BID   heparin injection (subcutaneous)  5,000 Units Subcutaneous Q8H   insulin aspart  0-20 Units Subcutaneous Q4H   insulin glargine-yfgn  15 Units Subcutaneous Daily   lidocaine  1 patch Transdermal Q24H   melatonin  6 mg Oral QHS   metroNIDAZOLE  500 mg Oral Q12H   pantoprazole (PROTONIX) IV  40 mg Intravenous Q12H   sodium chloride flush  10-40 mL Intracatheter Q12H   Continuous Infusions:  cefTRIAXone (ROCEPHIN)  IV 2 g (03/18/21 2049)   methocarbamol (ROBAXIN) IV     TPN ADULT (ION) 70 mL/hr at 03/18/21 1822   TPN ADULT (ION)     PRN Meds:.acetaminophen, hydrALAZINE, HYDROmorphone (DILAUDID) injection, lip balm, methocarbamol (ROBAXIN) IV, nitroGLYCERIN, ondansetron (ZOFRAN) IV, oxyCODONE, phenol, sodium chloride flush  Antimicrobials: Anti-infectives (From admission, onward)    Start     Dose/Rate Route Frequency Ordered Stop   03/18/21 2200  cefTRIAXone (ROCEPHIN) 2 g in sodium chloride 0.9 % 100 mL IVPB        2 g 200 mL/hr over 30 Minutes Intravenous Every 24 hours 03/18/21 1514     03/18/21 2200  metroNIDAZOLE (FLAGYL) tablet 500 mg        500 mg Oral Every 12 hours 03/18/21 1514     03/11/21 0900  piperacillin-tazobactam (ZOSYN) IVPB 3.375 g  Status:  Discontinued        3.375 g 12.5 mL/hr over 240 Minutes Intravenous Every 8 hours 03/11/21 0815 03/18/21 1514   03/05/21 0845  cefoTEtan (CEFOTAN) 2 g in sodium  chloride 0.9 % 100 mL IVPB        2 g 200 mL/hr over 30 Minutes Intravenous To [facility] 03/04/21 2206 03/05/21 1035        I have personally reviewed the following labs and images: CBC: Recent Labs  Lab 03/15/21 0337 03/16/21 0416 03/17/21 0313 03/18/21 0327 03/19/21 0414  WBC 12.4* 10.8* 12.6* 11.7* 9.0  NEUTROABS 7.9* 7.2 8.1* 7.4 5.2  HGB 11.2* 11.6* 11.5* 10.5* 10.9*  HCT 35.1* 36.7* 36.9* 34.3* 34.4*  MCV 68.8* 70.4* 70.8* 70.6* 69.9*  PLT 452* 481* 497* 467* 454*   BMP &GFR Recent Labs  Lab 03/13/21 0412 03/14/21 0800 03/15/21 0337 03/16/21 0802 03/17/21 0313 03/18/21 0327 03/19/21 0414  NA 134*   < > 133* 130* 130* 131* 132*  K 4.2   < > 4.5 5.0 4.6 4.9 4.6  CL 104   < > 101 99 97* 102 103  CO2 24   < > 25 25 24 24 23   GLUCOSE 166*   < > 165* 218* 199* 202* 170*  BUN 13   < > 17 17 19  31* 23  CREATININE 0.91   < > 0.90 0.92 0.88 1.25* 0.86  CALCIUM 8.8*   < > 9.1 9.3 9.3 9.1 9.0  MG 2.1  --  2.1  --   --  2.1  --   PHOS 4.0  --  4.2  --   --  4.6 3.7   < > = values in this interval not displayed.   Estimated Creatinine Clearance: 91.6 mL/min (by C-G formula based on SCr of 0.86 mg/dL). Liver & Pancreas: Recent Labs  Lab 03/15/21 0337 03/18/21 0327 03/19/21 0414  AST 38 37  --   ALT 28 33  --   ALKPHOS 81 84  --   BILITOT 0.6 0.6  --   PROT 7.9 7.8  --   ALBUMIN 2.2* 2.1* 2.1*   No results for input(s): LIPASE, AMYLASE in the last 168 hours. No results for input(s): AMMONIA in the last 168 hours. Diabetic: No results for input(s): HGBA1C in the last 72 hours. Recent Labs  Lab 03/18/21 1951 03/19/21 0025 03/19/21 0406 03/19/21 0822 03/19/21 1148  GLUCAP 139* 187* 170* 173* 141*   Cardiac Enzymes: No results for input(s): CKTOTAL, CKMB, CKMBINDEX, TROPONINI in the last 168 hours. No results for input(s): PROBNP in the last 8760 hours. Coagulation Profile: No results for input(s): INR, PROTIME in the last 168 hours. Thyroid  Function Tests: No results for input(s): TSH, T4TOTAL, FREET4, T3FREE, THYROIDAB in the last 72 hours. Lipid Profile: No results for input(s): CHOL, HDL, LDLCALC, TRIG, CHOLHDL, LDLDIRECT in the last 72 hours.  Anemia Panel: Recent Labs  03/18/21 0327  VITAMINB12 784  FOLATE 10.4  FERRITIN 476*  TIBC 279  IRON 29*  RETICCTPCT 1.2   Urine analysis:    Component Value Date/Time   COLORURINE YELLOW 03/04/2021 0300   APPEARANCEUR CLEAR 03/04/2021 0300   APPEARANCEUR Clear 01/12/2016 1634   LABSPEC 1.020 03/04/2021 0300   PHURINE 5.0 03/04/2021 0300   GLUCOSEU >=500 (A) 03/04/2021 0300   HGBUR NEGATIVE 03/04/2021 0300   BILIRUBINUR NEGATIVE 03/04/2021 0300   BILIRUBINUR negative 12/06/2018 1659   BILIRUBINUR Negative 01/12/2016 1634   KETONESUR 20 (A) 03/04/2021 0300   PROTEINUR NEGATIVE 03/04/2021 0300   UROBILINOGEN 2.0 (A) 12/06/2018 1659   UROBILINOGEN 1 10/23/2013 1555   NITRITE NEGATIVE 03/04/2021 0300   LEUKOCYTESUR NEGATIVE 03/04/2021 0300   Sepsis Labs: Invalid input(s): PROCALCITONIN, LACTICIDVEN  Microbiology: No results found for this or any previous visit (from the past 240 hour(s)).  Radiology Studies: No results found.     Daniel Perkins Daniel Perkins [facility]  If 7PM-7AM, please contact night-coverage [email or web address] 03/19/2021, 11:55 AM

## 2021-03-19 NOTE — Progress Notes (Signed)
Occupational Therapy Treatment Patient Details Name: Daniel Perkins MRN: [id number] DOB: [birth year] Today's Date: 03/19/2021   History of present illness Pt is 65 y/o M admitted to ED for acute abdominal pain on 03/04/21, found to have small bowel obstruction. He is S/p ex lap, small bowel perforation repair on 10/28. PMH includes CAD/MI, gout, CKD, HTN, DM and ACDF.   OT comments  Despite continued abdominal pain, pt eager to move and mobilize with therapy. Pt supervision - min guard A for bed mobility and transfers. Pt with notable decrease in bilateral foot pain during session, progressing with functional mobility. Pt able to walk hallway distance and complete standing grooming task with min guard A. Pt still presents with impaired balance and activity tolerance at this time, will continue to follow to address impairments listed. Recommend d/c to CIR.   Recommendations for follow up therapy are one component of a multi-disciplinary discharge planning process, led by the attending physician.  Recommendations may be updated based on patient status, additional functional criteria and insurance authorization.    Follow Up Recommendations  Acute inpatient rehab (3hours/day)    Assistance Recommended at Discharge Intermittent Supervision/Assistance  Equipment Recommendations  None recommended by OT    Recommendations for Other Services PT consult    Precautions / Restrictions Precautions Precautions: Other (comment) Precaution Comments: abdominal precautions Restrictions Weight Bearing Restrictions: No Other Position/Activity Restrictions: no lifting >10lbs       Mobility Bed Mobility Overal bed mobility: Needs Assistance Bed Mobility: Rolling;Sidelying to Sit Rolling: Min guard Sidelying to sit: Min guard;HOB elevated Supine to sit: Supervision;HOB elevated     General bed mobility comments: supports self with UE's when sitting EOB    Transfers Overall transfer level:  Needs assistance Equipment used: Rolling walker (2 wheels) Transfers: Sit to/from Stand Sit to Stand: Min guard           General transfer comment: v/cing for standing in upright posture during ambulation/transfers     Balance Overall balance assessment: Needs assistance Sitting-balance support: Feet supported;Bilateral upper extremity supported Sitting balance-Leahy Scale: Fair Sitting balance - Comments: Assist to donn shoes sitting EOB due to discomfort in belly   Standing balance support: During functional activity;Reliant on assistive device for balance Standing balance-Leahy Scale: Fair Standing balance comment: relies on RW/counter for support during static standing task at sink                           ADL either performed or assessed with clinical judgement   ADL       Grooming: Oral care;Standing;Min guard               Lower Body Dressing: Maximal assistance;Sitting/lateral leans Lower Body Dressing Details (indicate cue type and reason): donned shoes max A sitting EOB due to abdominal pain Toilet Transfer: Min guard;Ambulation;Rolling walker (2 wheels) Toilet Transfer Details (indicate cue type and reason): simulated, ambulation to chair         Functional mobility during ADLs: Min guard;Rolling walker (2 wheels) General ADL Comments: foot pain has greatly improved, able to tolerate donning shoes    Extremity/Trunk Assessment Upper Extremity Assessment Upper Extremity Assessment: Overall WFL for tasks assessed   Lower Extremity Assessment Lower Extremity Assessment: Defer to PT evaluation        Vision   Vision Assessment?: No apparent visual deficits   Perception Perception Perception: Within Functional Limits   Praxis Praxis Praxis: Not tested    Cognition Arousal/Alertness:  Awake/alert Behavior During Therapy: WFL for tasks assessed/performed Overall Cognitive Status: Within Functional Limits for tasks assessed                                             Exercises     Shoulder Instructions       General Comments Visitor present during session.    Pertinent Vitals/ Pain       Pain Assessment: 0-10 Pain Score: 5  Faces Pain Scale: Hurts little more Pain Location: abdomen Pain Descriptors / Indicators: Discomfort Pain Intervention(s): Limited activity within patient's tolerance;Monitored during session;Patient requesting pain meds-RN notified;Relaxation;Repositioned  Home Living                                          Prior Functioning/Environment              Frequency  Min 2X/week        Progress Toward Goals  OT Goals(current goals can now be found in the care plan section)  Progress towards OT goals: Progressing toward goals  Acute Rehab OT Goals Patient Stated Goal: reduce pain OT Goal Formulation: With patient Time For Goal Achievement: 03/23/21 Potential to Achieve Goals: Good ADL Goals Pt Will Perform Upper Body Dressing: sitting;with supervision Pt Will Perform Lower Body Dressing: sit to/from stand;with min guard assist Pt Will Transfer to Toilet: regular height toilet;with min guard assist Pt Will Perform Tub/Shower Transfer: with min assist  Plan Discharge plan remains appropriate;Frequency remains appropriate    Co-evaluation                 AM-PAC OT "6 Clicks" Daily Activity     Outcome Measure   Help from another person eating meals?: None Help from another person taking care of personal grooming?: A Little Help from another person toileting, which includes using toliet, bedpan, or urinal?: A Little Help from another person bathing (including washing, rinsing, drying)?: A Lot Help from another person to put on and taking off regular upper body clothing?: A Little Help from another person to put on and taking off regular lower body clothing?: A Little 6 Click Score: 18    End of Session Equipment Utilized During  Treatment: Gait belt;Rolling walker (2 wheels)  OT Visit Diagnosis: Unsteadiness on feet (R26.81);Other abnormalities of gait and mobility (R26.89);Muscle weakness (generalized) (M62.81);Pain   Activity Tolerance Patient tolerated treatment well   Patient Left in chair;with chair alarm set;with call bell/phone within reach   Nurse Communication Mobility status;Patient requests pain meds        Time: 1223-1240 OT Time Calculation (min): 17 min  Charges: OT General Charges $OT Visit: 1 Visit OT Treatments $Self Care/Home Management : 8-22 mins  Alfonzo Beers, OTD, OTR/L Acute Rehab [phone]   Mayer Masker 03/19/2021, 12:54 PM

## 2021-03-19 NOTE — Progress Notes (Signed)
Mobility Specialist Criteria Algorithm Info.  Mobility Team: HOB elevated: Activity: Ambulated in hall; Dangled on edge of bed (LE exercises) Range of motion: Active; All extremities Level of assistance: Contact guard assist, steadying assist Assistive device: Front wheel walker Distance ambulated (ft): 365 ft Mobility response: Tolerated well Bed Position: Semi-fowlers  Patient received in bed eager to participate in mobility. Got to EOB independently. Required minimal assistance to don shoes. Stood and ambulated in hallway at min guard. Required seated rest break x1. Upon returning to room pt performed LE exercises for 15+ minutes. Tolerated without complaint or incident was in bed with all needs met.   03/19/2021 3:49 PM

## 2021-03-20 DIAGNOSIS — K56609 Unspecified intestinal obstruction, unspecified as to partial versus complete obstruction: Secondary | ICD-10-CM | POA: Diagnosis not present

## 2021-03-20 DIAGNOSIS — E440 Moderate protein-calorie malnutrition: Secondary | ICD-10-CM | POA: Diagnosis not present

## 2021-03-20 DIAGNOSIS — E785 Hyperlipidemia, unspecified: Secondary | ICD-10-CM | POA: Diagnosis not present

## 2021-03-20 DIAGNOSIS — M1A9XX0 Chronic gout, unspecified, without tophus (tophi): Secondary | ICD-10-CM | POA: Diagnosis not present

## 2021-03-20 LAB — CBC WITH DIFFERENTIAL/PLATELET
Abs Immature Granulocytes: 0 10*3/uL (ref 0.00–0.07)
Basophils Absolute: 0 10*3/uL (ref 0.0–0.1)
Basophils Relative: 0 %
Eosinophils Absolute: 0.3 10*3/uL (ref 0.0–0.5)
Eosinophils Relative: 4 %
HCT: 32.9 % — ABNORMAL LOW (ref 39.0–52.0)
Hemoglobin: 10.5 g/dL — ABNORMAL LOW (ref 13.0–17.0)
Lymphocytes Relative: 23 %
Lymphs Abs: 1.7 10*3/uL (ref 0.7–4.0)
MCH: 22 pg — ABNORMAL LOW (ref 26.0–34.0)
MCHC: 31.9 g/dL (ref 30.0–36.0)
MCV: 69 fL — ABNORMAL LOW (ref 80.0–100.0)
Monocytes Absolute: 0.7 10*3/uL (ref 0.1–1.0)
Monocytes Relative: 9 %
Neutro Abs: 4.9 10*3/uL (ref 1.7–7.7)
Neutrophils Relative %: 64 %
Platelets: 429 10*3/uL — ABNORMAL HIGH (ref 150–400)
RBC: 4.77 MIL/uL (ref 4.22–5.81)
RDW: 14.7 % (ref 11.5–15.5)
WBC: 7.6 10*3/uL (ref 4.0–10.5)
nRBC: 0 % (ref 0.0–0.2)
nRBC: 1 /100 WBC — ABNORMAL HIGH

## 2021-03-20 LAB — GLUCOSE, CAPILLARY
Glucose-Capillary: 136 mg/dL — ABNORMAL HIGH (ref 70–99)
Glucose-Capillary: 127 mg/dL — ABNORMAL HIGH (ref 70–99)
Glucose-Capillary: 139 mg/dL — ABNORMAL HIGH (ref 70–99)
Glucose-Capillary: 130 mg/dL — ABNORMAL HIGH (ref 70–99)
Glucose-Capillary: 116 mg/dL — ABNORMAL HIGH (ref 70–99)

## 2021-03-20 MED ORDER — INSULIN ASPART 100 UNIT/ML IJ SOLN
0.0000 [IU] | Freq: Three times a day (TID) | INTRAMUSCULAR | Status: DC
  Administered 2021-03-20 (×2): 3 [IU] via SUBCUTANEOUS
  Administered 2021-03-21: 4 [IU] via SUBCUTANEOUS
  Administered 2021-03-21 – 2021-03-22 (×4): 3 [IU] via SUBCUTANEOUS
  Administered 2021-03-22: 0 [IU] via SUBCUTANEOUS
  Administered 2021-03-23: 3 [IU] via SUBCUTANEOUS
  Administered 2021-03-23: 4 [IU] via SUBCUTANEOUS
  Administered 2021-03-23 – 2021-03-24 (×3): 3 [IU] via SUBCUTANEOUS
  Administered 2021-03-25 (×2): 4 [IU] via SUBCUTANEOUS

## 2021-03-20 MED ORDER — POLYETHYLENE GLYCOL 3350 17 G PO PACK
17.0000 g | PACK | Freq: Two times a day (BID) | ORAL | Status: DC
  Administered 2021-03-20 – 2021-03-25 (×11): 17 g via ORAL
  Filled 2021-03-20 (×11): qty 1

## 2021-03-20 MED ORDER — PANTOPRAZOLE SODIUM 40 MG PO TBEC
40.0000 mg | DELAYED_RELEASE_TABLET | Freq: Two times a day (BID) | ORAL | Status: DC
  Administered 2021-03-20 – 2021-03-25 (×10): 40 mg via ORAL
  Filled 2021-03-20 (×10): qty 1

## 2021-03-20 MED ORDER — INSULIN ASPART 100 UNIT/ML IJ SOLN: 0.0000 [IU] | Freq: Every day | INTRAMUSCULAR | Status: DC

## 2021-03-20 NOTE — Progress Notes (Signed)
PROGRESS NOTE  Daniel Perkins TJQ:[id number] DOB: 02/27/1956   PCP: Ellyn Hack, MD  Patient is from: Home  DOA: 03/04/2021 LOS: 16  Chief complaints:  Chief Complaint  Patient presents with   Abdominal Pain     Brief Narrative / Interim history: 65 year old M with PMH of CAD, PAD, DM-2, HTN and HLD presenting with abdominal pain and found to have SBO for which she underwent ex lap with small bowel perforation repair on 03/05/2021.  Postop course complicated by persistent ileus requiring NG tube followed by TPN.  CT abdomen and pelvis on 03/10/2021 with nonspecific thickened and dilated small bowel, possible ileus/partial SBO and small pelvic fluid collection that was not amenable to drainage.  Patient was started on IV Zosyn on 11/3. ID consulted on 11/10 for guidance on his antibiotics.  Repeat CT abdomen and pelvis on 11/11 showed interval decrease in size of pelvic fluid collection but new 3.3 cm loculated fluid in subcutaneous plane in the anterior abdominal wall superior to umbilicus and posterior to skin staples.  Antibiotics de-escalated to IV CTX and Flagyl.  ID recommended CTX and Flagyl for 2 weeks from 11/11, repeat CT abdomen and pelvis in 2 weeks and reconsult based on CT result  Therapy recommended CIR but insurance declined after peer to peer stating patient is not medically ready for discharge.  Expedited appeal in process.  Subjective: Seen and examined earlier this morning.  Some bleeding from distal end of surgical site.  Seems to have some purulence.  Patient denies abdominal pain.  No bowel movement but passing gas.  Objective: Vitals:   03/19/21 1535 03/19/21 1930 03/20/21 0400 03/20/21 0820  BP: 93/62 117/75 (!) 165/85 (!) 157/88  Pulse: 79 74 77 81  Resp: 18 16 17 16   Temp: 98 F (36.7 C) 98.3 F (36.8 C) 98 F (36.7 C) 98.4 F (36.9 C)  TempSrc: Oral Oral Oral Oral  SpO2: 98% 95% 93% 98%  Weight:      Height:        Intake/Output Summary  (Last 24 hours) at 03/20/2021 1555 Last data filed at 03/20/2021 1141 Gross per 24 hour  Intake 1231.43 ml  Output 1050 ml  Net 181.43 ml   Filed Weights   03/04/21 0256 03/11/21 1400 03/15/21 0411  Weight: 94.8 kg 88.8 kg 87.4 kg    Examination:  GENERAL: No apparent distress.  Nontoxic. HEENT: MMM.  Vision and hearing grossly intact.  NECK: Supple.  No apparent JVD.  RESP:  No IWOB.  Fair aeration bilaterally. CVS:  RRR. Heart sounds normal.  ABD/GI/GU: BS+. Abd soft, NTND.  Some bleeding from distal end of surgical wound.  Seems to have some purulence. MSK/EXT:  Moves extremities.  Significant muscle mass and subcu fat loss in BLE. SKIN: Surgical wound with some bleeding from distal end as above. NEURO: Awake and alert. Oriented appropriately.  No apparent focal neuro deficit. PSYCH: Calm. Normal affect.   Procedures:  03/05/2021-diagnostic laparoscopy, lysis of single addition band in LLQ with resolution of SBO, mini laparotomy with primary repair of 2 mm small bowel perforation by Dr. 03/07/2021  Microbiology summarized: COVID-19 and influenza PCR nonreactive. MRSA PCR nonreactive.  Assessment & Plan: SBO due to adhesion s/p laparoscopy, lysis of adhesion and mini laparotomy for small perf repair Persistent postoperative ileus-no BM yet but flatus. Possible enteritis/pelvic abscess in the setting of the above -CT A/P on 11/2 with small bowel wall thickening and mesenteric edema with dilated bowel proximally raising concern  for possible enteritis, inflammatory process or ischemic changes, and new enhancing small fluid collection in pelvis measuring 3.0 x 3.1 x 4.2 cm. -CT A/P on 11/11 howed interval decrease in size of pelvic fluid collection but new 3.3 cm loculated fluid in subcutaneous plane in the anterior abdominal wall superior to umbilicus and posterior to skin staples. -CRP elevated to 9.5.  Leukocytosis resolved. -11/12-Some bleeding from distal end of surgical wound.   Seems to have some purulence.  Surgery  to assess -General surgery weaning of TPN. -ID consulted appreciate recs -Repeat CT A/P in 2 weeks, on 04/01/2021 to reassess intra-abdominal fluid collection -IV Zosyn 11/3>> CTX +Flagyl 11/11-11/25.  -On soft diet per general surgery. -Mobilize patient.   Controlled NIDDM-2 with hyperglycemia: A1c 6.0% on 10/27.  On Victoza, Jardiance and glipizide alone. Recent Labs  Lab 03/19/21 1937 03/19/21 2347 03/20/21 0404 03/20/21 0754 03/20/21 1127  GLUCAP 144* 129* 136* 127* 139*  -Continue holding home meds -Continue SSI-resistance -Continue basal insulin at 15 units daily -History of statin intolerance.  AKI/azotemia: Resolved. Recent Labs    03/10/21 0149 03/11/21 0045 03/12/21 0340 03/13/21 0412 03/14/21 0800 03/15/21 0337 03/16/21 0802 03/17/21 0313 03/18/21 0327 03/19/21 0414  BUN 15 15 13 13 16 17 17 19  31* 23  CREATININE 0.97 1.01 0.98 0.91 0.91 0.90 0.92 0.88 1.25* 0.86  -Monitor intermittently  Essential hypertension: Normotensive -Decreased Coreg to 6.25 mg twice daily on 11/10. -Continue holding home ARB.   Bilateral feet pain: has history of gout but uric acid normal.  History of neuropathy.  Pain resolved. -Continue with  voltaren gel, colchicine and gabapentin.    History of CAD: No chest pain or anginal symptoms. -Continue home Coreg and aspirin  Hyperlipidemia: Has history of statin intolerance. -Could benefit from PCSK9 inhibitors  Hyponatremia: Improved. -Continue monitoring  Microcytic anemia: H&H relatively stable.  Anemia panel basically normal. Recent Labs    03/11/21 0045 03/12/21 0340 03/13/21 0412 03/14/21 0319 03/15/21 0337 03/16/21 0416 03/17/21 0313 03/18/21 0327 03/19/21 0414 03/20/21 0338  HGB 11.6* 11.1* 11.4* 11.5* 11.2* 11.6* 11.5* 10.5* 10.9* 10.5*  -Continue monitoring  Leukocytosis/bandemia: Likely due to #1.  Resolved. -Antibiotics as above  Physical  deconditioning -Continue PT/OT -Final disposition TBD.  Appealing for CIR.  Moderate malnutrition Body mass index is 30.18 kg/m. Nutrition Problem: Moderate Malnutrition Etiology: acute illness (Bowel obstruction) Signs/Symptoms: mild fat depletion, mild muscle depletion, energy intake < or equal to 50% for > or equal to 5 days Interventions: Ensure Enlive (each supplement provides 350kcal and 20 grams of protein), TPN   DVT prophylaxis:  heparin injection 5,000 Units Start: 03/12/21 1645 Place and maintain sequential compression device Start: 03/10/21 1036  Code Status: Full code Family Communication: None at bedside. Level of care: Med-Surg Status is: Inpatient  Remains inpatient appropriate because: Possible enteritis requiring IV antibiotics, persistent postop ileus requiring TPN and safe disposition     Consultants:  General surgery Infectious disease Interventional radiology   Sch Meds:  Scheduled Meds:  carvedilol  6.25 mg Oral BID AC   Chlorhexidine Gluconate Cloth  6 each Topical Daily   colchicine  0.6 mg Oral Daily   diclofenac Sodium  2 g Topical QID   feeding supplement  237 mL Oral TID BM   gabapentin  100 mg Oral BID   heparin injection (subcutaneous)  5,000 Units Subcutaneous Q8H   insulin aspart  0-20 Units Subcutaneous TID WC   insulin aspart  0-5 Units Subcutaneous QHS   insulin glargine-yfgn  15 Units Subcutaneous Daily   lidocaine  1 patch Transdermal Q24H   melatonin  6 mg Oral QHS   metroNIDAZOLE  500 mg Oral Q12H   pantoprazole  40 mg Oral BID   polyethylene glycol  17 g Oral BID   sodium chloride flush  10-40 mL Intracatheter Q12H   Continuous Infusions:  cefTRIAXone (ROCEPHIN)  IV 2 g (03/19/21 2110)   methocarbamol (ROBAXIN) IV     TPN ADULT (ION) 45 mL/hr at 03/19/21 1805   PRN Meds:.acetaminophen, hydrALAZINE, HYDROmorphone (DILAUDID) injection, lip balm, methocarbamol (ROBAXIN) IV, nitroGLYCERIN, ondansetron (ZOFRAN) IV, oxyCODONE,  phenol, sodium chloride flush  Antimicrobials: Anti-infectives (From admission, onward)    Start     Dose/Rate Route Frequency Ordered Stop   03/18/21 2200  cefTRIAXone (ROCEPHIN) 2 g in sodium chloride 0.9 % 100 mL IVPB        2 g 200 mL/hr over 30 Minutes Intravenous Every 24 hours 03/18/21 1514     03/18/21 2200  metroNIDAZOLE (FLAGYL) tablet 500 mg        500 mg Oral Every 12 hours 03/18/21 1514     03/11/21 0900  piperacillin-tazobactam (ZOSYN) IVPB 3.375 g  Status:  Discontinued        3.375 g 12.5 mL/hr over 240 Minutes Intravenous Every 8 hours 03/11/21 0815 03/18/21 1514   03/05/21 0845  cefoTEtan (CEFOTAN) 2 g in sodium chloride 0.9 % 100 mL IVPB        2 g 200 mL/hr over 30 Minutes Intravenous To [facility] 03/04/21 2206 03/05/21 1035        I have personally reviewed the following labs and images: CBC: Recent Labs  Lab 03/16/21 0416 03/17/21 0313 03/18/21 0327 03/19/21 0414 03/20/21 0338  WBC 10.8* 12.6* 11.7* 9.0 7.6  NEUTROABS 7.2 8.1* 7.4 5.2 4.9  HGB 11.6* 11.5* 10.5* 10.9* 10.5*  HCT 36.7* 36.9* 34.3* 34.4* 32.9*  MCV 70.4* 70.8* 70.6* 69.9* 69.0*  PLT 481* 497* 467* 454* 429*   BMP &GFR Recent Labs  Lab 03/15/21 0337 03/16/21 0802 03/17/21 0313 03/18/21 0327 03/19/21 0414  NA 133* 130* 130* 131* 132*  K 4.5 5.0 4.6 4.9 4.6  CL 101 99 97* 102 103  CO2 25 25 24 24 23   GLUCOSE 165* 218* 199* 202* 170*  BUN 17 17 19  31* 23  CREATININE 0.90 0.92 0.88 1.25* 0.86  CALCIUM 9.1 9.3 9.3 9.1 9.0  MG 2.1  --   --  2.1  --   PHOS 4.2  --   --  4.6 3.7   Estimated Creatinine Clearance: 91.6 mL/min (by C-G formula based on SCr of 0.86 mg/dL). Liver & Pancreas: Recent Labs  Lab 03/15/21 0337 03/18/21 0327 03/19/21 0414  AST 38 37  --   ALT 28 33  --   ALKPHOS 81 84  --   BILITOT 0.6 0.6  --   PROT 7.9 7.8  --   ALBUMIN 2.2* 2.1* 2.1*   No results for input(s): LIPASE, AMYLASE in the last 168 hours. No results for input(s): AMMONIA in  the last 168 hours. Diabetic: No results for input(s): HGBA1C in the last 72 hours. Recent Labs  Lab 03/19/21 1937 03/19/21 2347 03/20/21 0404 03/20/21 0754 03/20/21 1127  GLUCAP 144* 129* 136* 127* 139*   Cardiac Enzymes: No results for input(s): CKTOTAL, CKMB, CKMBINDEX, TROPONINI in the last 168 hours. No results for input(s): PROBNP in the last 8760 hours. Coagulation Profile: No results for input(s): INR, PROTIME in  the last 168 hours. Thyroid Function Tests: No results for input(s): TSH, T4TOTAL, FREET4, T3FREE, THYROIDAB in the last 72 hours. Lipid Profile: No results for input(s): CHOL, HDL, LDLCALC, TRIG, CHOLHDL, LDLDIRECT in the last 72 hours.  Anemia Panel: Recent Labs    03/18/21 0327  VITAMINB12 784  FOLATE 10.4  FERRITIN 476*  TIBC 279  IRON 29*  RETICCTPCT 1.2   Urine analysis:    Component Value Date/Time   COLORURINE YELLOW 03/04/2021 0300   APPEARANCEUR CLEAR 03/04/2021 0300   APPEARANCEUR Clear 01/12/2016 1634   LABSPEC 1.020 03/04/2021 0300   PHURINE 5.0 03/04/2021 0300   GLUCOSEU >=500 (A) 03/04/2021 0300   HGBUR NEGATIVE 03/04/2021 0300   BILIRUBINUR NEGATIVE 03/04/2021 0300   BILIRUBINUR negative 12/06/2018 1659   BILIRUBINUR Negative 01/12/2016 1634   KETONESUR 20 (A) 03/04/2021 0300   PROTEINUR NEGATIVE 03/04/2021 0300   UROBILINOGEN 2.0 (A) 12/06/2018 1659   UROBILINOGEN 1 10/23/2013 1555   NITRITE NEGATIVE 03/04/2021 0300   LEUKOCYTESUR NEGATIVE 03/04/2021 0300   Sepsis Labs: Invalid input(s): PROCALCITONIN, [place]  Microbiology: No results found for this or any previous visit (from the past 240 hour(s)).  Radiology Studies: No results found.     Blaiden Werth T. Abrams  If 7PM-7AM, please contact night-coverage [email or web address] 03/20/2021, 3:55 PM

## 2021-03-20 NOTE — Progress Notes (Signed)
PHARMACY - TOTAL PARENTERAL NUTRITION CONSULT NOTE  Indication: Prolonged ileus  Patient Measurements: Height: 5\' 7"  (170.2 cm) Weight: 87.4 kg (192 lb 10.9 oz) (standing) IBW/kg (Calculated) : 66.1 TPN AdjBW (KG): 71.8 Body mass index is 30.18 kg/m.  Assessment:  [name] presented on 10/27 with acute abdominal pain, nausea and vomiting after dinner. CT showed SBO, and patient underwent ex-lap with lysis of single LLQ adhesion and repair of small bowel perforation on 10/28. NG tube clamped and clears started 11/01. Subsequently had nausea and lower abdominal pain, NG tube switched back to LIWS. Repeat CT on 11/2 showed nonspecific thickened and dilated small bowel, possible ileus or SBO with a small pelvic fluid collection. Patient has had inadequate nutrition for 7 days, so Pharmacy was consulted for TPN management.  Patient reported he typically eats 3 meals a day.  He loves steak and chicken; eats canned vegetables and fruits. He weighed 237 lbs earlier in the year, lost ~30 lbs over the summer.  Per conversation with surgery, he is tolerating full liquid diet with no N/V but no bowel movement yet. Diet advanced to full yesterday. Per patient, eating full breakfast (eggs, biscuit, sausage) and tolerating well.   Glucose / Insulin: Hx DM on glipizide, Victoza and Jardiance PTA (all on hold), A1c 6% CBGs controlled, utilized 19 units SSI in past 24 hrs + 20 units insulin in TPN + Semglee 15 units daily Renal/Electrolytes: last labs 11/11 - SCr normalized, all lytes WNL except low Na 132 (stable) and corrected Ca slightly high ~10.4 Hepatic: LFTs / tbili WNL; Alb 2.1, TG 118  Intake / Output; MIVF: UOP 0.8 ml/kg/hr, LBM 11/9  GI Imaging: none since TPN GI Surgeries / Procedures: none since TPN  Central access: PICC placed 03/11/21 TPN start date: 03/11/21  Nutritional Goals: RD assessment 11/8: Estimated Needs Total Energy Estimated Needs: 2100-2300 kcals Total Protein Estimated  Needs: 120-135 g Total Fluid Estimated Needs: >/= 2 L  Current Nutrition:  Advanced to soft diet 11/9 - tolerating majority of his meals Ensure Enlive TID - all doses charted given yesterday  Plan:  Per discussion with Dr. 13/9 of Surgery, plan to d/c TPN when current bag runs out at 1800 tonight Communicated plan with RN - at 1600, wean TPN to 20 ml/hr x 2 hrs, then stop TPN Continue Semglee 15 units daily + change SSI to qACHS and adjust as needed Change PPI IV BID to PO D/c TPN labs and nursing orders   Luisa Hart, PharmD, BCPS Please check AMION for all [facility] contact numbers Clinical Pharmacist 03/20/2021 7:47 AM

## 2021-03-20 NOTE — Progress Notes (Signed)
15 Days Post-Op   Subjective/Chief Complaint: No complaints some mild constipation abdominal pain better   CT shows improvement/stability of intraabdominal collections  2 subcutaneous fluid collections notes   Objective: Vital signs in last 24 hours: Temp:  [98 F (36.7 C)-98.4 F (36.9 C)] 98.4 F (36.9 C) (11/12 0820) Pulse Rate:  [74-81] 81 (11/12 0820) Resp:  [16-18] 16 (11/12 0820) BP: (93-165)/(62-88) 157/88 (11/12 0820) SpO2:  [93 %-98 %] 98 % (11/12 0820) Last BM Date: 03/17/21  Intake/Output from previous day: 11/11 0701 - 11/12 0700 In: 1861.4 [P.O.:1350; I.V.:510; IV Piggyback:1.4] Out: 1600 [Urine:1600] Intake/Output this shift: No intake/output data recorded.    Gen:  Alert, NAD, pleasant Card:  RRR Pulm: CTAB, normal effort on room air Abd: soft, no distension, +BS, very minimal TTP near incisions, lap incisions and midline c/d/i with staples present and no spreading erythema or drainage MSK: no calf edema or TTP bilaterally Skin: warm and dry  SBO with no previous abdominal surgical history  POD#15 S/P diagnostic laparoscopy, lysis of single adhesive band in the left lower quadrant with resolution of bowel obstruction, mini laparotomy with primary repair of 2 mm small bowel perforation on 03/05/21 Dr. Fredricka Bonine - CT scan 11/11  improved 2 subcutaneous collections noted - Fluid collection on CT small and not amenable to drainage.     tolerated soft diet.and follow PO intake ~ - Mobilize. Working with PT/OT who are currently recommending CIR. .  - - staples out at discharge- may need to open wound but no erythema at this point or signs of infection    Doing well surgically. Hopefully stable for discharge to CIR soon as PO intake improves   ID - cefotetan periop 10/28, zosyn 11/3>> VTE - SCDs, sq heparin FEN - IVF, soft, ensure tid, TPN 50% Foley - out    DM HTN CAD h/o MI several years ago - followed by Dr. Eldridge Dace PAD HLD Chronic neck pain Lab  Results:  Recent Labs    03/19/21 0414 03/20/21 0338  WBC 9.0 7.6  HGB 10.9* 10.5*  HCT 34.4* 32.9*  PLT 454* 429*   BMET Recent Labs    03/18/21 0327 03/19/21 0414  NA 131* 132*  K 4.9 4.6  CL 102 103  CO2 24 23  GLUCOSE 202* 170*  BUN 31* 23  CREATININE 1.25* 0.86  CALCIUM 9.1 9.0   PT/INR No results for input(s): LABPROT, INR in the last 72 hours. ABG No results for input(s): PHART, HCO3 in the last 72 hours.  Invalid input(s): PCO2, PO2  Studies/Results: CT ABDOMEN PELVIS W CONTRAST  Result Date: 03/19/2021 CLINICAL DATA:  Periumbilical pain, recent surgery for small bowel obstruction EXAM: CT ABDOMEN AND PELVIS WITH CONTRAST TECHNIQUE: Multidetector CT imaging of the abdomen and pelvis was performed using the standard protocol following bolus administration of intravenous contrast. CONTRAST:  OMNIPAQUE IOHEXOL 300 MG/ML  SOLN COMPARISON:  Previous studies including the examination of 03/10/2021 FINDINGS: Lower chest: There are subpleural blebs and bullae in the lower lung fields. There are linear densities with air bronchogram in right middle lobe and both lower lobes with slight improvement. Hepatobiliary: No focal abnormality is seen in the the liver. Gallbladder is unremarkable. There is no dilation of bile ducts. Pancreas: No focal abnormality is seen. Spleen: Unremarkable. Adrenals/Urinary Tract: Adrenals are not enlarged. There is no hydronephrosis. There are no renal or ureteral stones. Urinary bladder is unremarkable. Stomach/Bowel: Stomach is not distended. There is no significant small bowel dilation. Appendix  is not dilated. There is no significant wall thickening in colon. Scattered diverticula are seen in colon without signs of focal diverticulitis. There is 2.8 x 2.4 cm loculated fluid collection anterior to the rectum slightly to the right of midline in the pelvis. There is interval decrease in size since 03/10/2021. Vascular/Lymphatic: Arterial  calcifications are seen. No new significant lymphadenopathy seen. Reproductive: Unremarkable. Other: There is no ascites or pneumoperitoneum. Skin staples are seen in the anterior abdominal wall. There is small ventral hernia containing fat in the epigastrium. There is interval appearance of 2.4 cm smooth marginated structure in the subcutaneous plane with fat fluid level. There is another loculated fluid collection measuring 3.3 cm in diameter in the anterior abdominal wall immediately superior to the umbilicus. There are no pockets of air within these fluid collections. There are foci of subcutaneous stranding in the anterior abdominal wall, possibly related to parenteral administration of medications. Musculoskeletal: Degenerative changes are noted in the lumbar spine with no significant interval change. IMPRESSION: There is interval decrease in size of small loculated fluid collection in the pelvis anterior to the rectum. This fluid collection measures 2.8 cm in maximum diameter in the current study. No new loculated intra-abdominal fluid collections are seen. There is no evidence of intestinal obstruction or pneumoperitoneum. There is no hydronephrosis. There is new 3.3 cm loculated fluid collection in the subcutaneous plane in the anterior abdominal wall immediately superior to the umbilicus and posterior to the skin staples. This may suggest seroma or an abscess. There are no pockets of air in this fluid collection. There is another smaller loculated fluid collection with fat fluid level in the epigastrium adjacent to a ventral hernia. Possibility of fat necrosis in this region is not excluded. There are patchy linear infiltrates in both lower lung fields suggesting subsegmental atelectasis with slight improvement. Electronically Signed   By: Ernie Avena M.D.   On: 03/19/2021 13:05    Anti-infectives: Anti-infectives (From admission, onward)    Start     Dose/Rate Route Frequency Ordered Stop    03/18/21 2200  cefTRIAXone (ROCEPHIN) 2 g in sodium chloride 0.9 % 100 mL IVPB        2 g 200 mL/hr over 30 Minutes Intravenous Every 24 hours 03/18/21 1514     03/18/21 2200  metroNIDAZOLE (FLAGYL) tablet 500 mg        500 mg Oral Every 12 hours 03/18/21 1514     03/11/21 0900  piperacillin-tazobactam (ZOSYN) IVPB 3.375 g  Status:  Discontinued        3.375 g 12.5 mL/hr over 240 Minutes Intravenous Every 8 hours 03/11/21 0815 03/18/21 1514   03/05/21 0845  cefoTEtan (CEFOTAN) 2 g in sodium chloride 0.9 % 100 mL IVPB        2 g 200 mL/hr over 30 Minutes Intravenous To [facility] 03/04/21 2206 03/05/21 1035       Assessment/Plan: s/p Procedure(s): LAPAROSCOPY DIAGNOSTIC (N/A) EXPLORATORY LAPAROTOMY, REPAIR SMALL BOWEL PERFORATION (Right) LYSIS OF ADHESION      SBO with no previous abdominal surgical history  POD#15 S/P diagnostic laparoscopy, lysis of single adhesive band in the left lower quadrant with resolution of bowel obstruction, mini laparotomy with primary repair of 2 mm small bowel perforation on 03/05/21 Dr. Fredricka Bonine - CT scan 11/11  improved 2 subcutaneous collections noted - Fluid collection on CT small and not amenable to drainage.     tolerated soft diet.and follow PO intake ~ - Mobilize. Working with PT/OT who are  currently recommending CIR. .  - - staples out at discharge- may need to open wound but no erythema at this point or signs of infection    Doing well surgically. Hopefully stable for discharge to CIR soon as PO intake improves   ID - cefotetan periop 10/28, zosyn 11/3>> VTE - SCDs, sq heparin FEN - IVF, soft, ensure tid, TPN 50% Foley - out    DM HTN CAD h/o MI several years ago - followed by Dr. Eldridge Dace PAD HLD Chronic neck pain  LOS: 16 days    Daniel Perkins 03/20/2021

## 2021-03-20 NOTE — Progress Notes (Signed)
Notified on call PA for surgery, of small amount of bleeding noted to lower midline staple. Gauze dressing applied by charge RN. Per PA will assess surgical site 11/13 during rounds. Will continue to monitor.

## 2021-03-21 ENCOUNTER — Inpatient Hospital Stay (HOSPITAL_COMMUNITY): Payer: Medicare Other

## 2021-03-21 DIAGNOSIS — K56609 Unspecified intestinal obstruction, unspecified as to partial versus complete obstruction: Secondary | ICD-10-CM | POA: Diagnosis not present

## 2021-03-21 DIAGNOSIS — E440 Moderate protein-calorie malnutrition: Secondary | ICD-10-CM | POA: Diagnosis not present

## 2021-03-21 DIAGNOSIS — M1A9XX0 Chronic gout, unspecified, without tophus (tophi): Secondary | ICD-10-CM | POA: Diagnosis not present

## 2021-03-21 DIAGNOSIS — E785 Hyperlipidemia, unspecified: Secondary | ICD-10-CM | POA: Diagnosis not present

## 2021-03-21 LAB — CBC WITH DIFFERENTIAL/PLATELET
Abs Immature Granulocytes: 0.05 10*3/uL (ref 0.00–0.07)
Basophils Absolute: 0.1 10*3/uL (ref 0.0–0.1)
Basophils Relative: 1 %
Eosinophils Absolute: 0.3 10*3/uL (ref 0.0–0.5)
Eosinophils Relative: 4 %
HCT: 35.1 % — ABNORMAL LOW (ref 39.0–52.0)
Hemoglobin: 10.9 g/dL — ABNORMAL LOW (ref 13.0–17.0)
Immature Granulocytes: 1 %
Lymphocytes Relative: 28 %
Lymphs Abs: 2.1 10*3/uL (ref 0.7–4.0)
MCH: 21.9 pg — ABNORMAL LOW (ref 26.0–34.0)
MCHC: 31.1 g/dL (ref 30.0–36.0)
MCV: 70.5 fL — ABNORMAL LOW (ref 80.0–100.0)
Monocytes Absolute: 1.4 10*3/uL — ABNORMAL HIGH (ref 0.1–1.0)
Monocytes Relative: 19 %
Neutro Abs: 3.5 10*3/uL (ref 1.7–7.7)
Neutrophils Relative %: 47 %
Platelets: 474 10*3/uL — ABNORMAL HIGH (ref 150–400)
RBC: 4.98 MIL/uL (ref 4.22–5.81)
RDW: 14.8 % (ref 11.5–15.5)
WBC: 7.3 10*3/uL (ref 4.0–10.5)
nRBC: 0 % (ref 0.0–0.2)

## 2021-03-21 LAB — GLUCOSE, CAPILLARY
Glucose-Capillary: 122 mg/dL — ABNORMAL HIGH (ref 70–99)
Glucose-Capillary: 123 mg/dL — ABNORMAL HIGH (ref 70–99)
Glucose-Capillary: 134 mg/dL — ABNORMAL HIGH (ref 70–99)
Glucose-Capillary: 138 mg/dL — ABNORMAL HIGH (ref 70–99)
Glucose-Capillary: 145 mg/dL — ABNORMAL HIGH (ref 70–99)
Glucose-Capillary: 146 mg/dL — ABNORMAL HIGH (ref 70–99)
Glucose-Capillary: 153 mg/dL — ABNORMAL HIGH (ref 70–99)

## 2021-03-21 NOTE — Progress Notes (Signed)
16 Days Post-Op   Subjective/Chief Complaint: Patient doing well without complaint.  No BM over the last 2 days.  No nausea, vomiting or abdominal pain.  Scant drainage from incision noted yesterday.   Objective: Vital signs in last 24 hours: Temp:  [98 F (36.7 C)-100 F (37.8 C)] 98 F (36.7 C) (11/13 0807) Pulse Rate:  [77-83] 80 (11/13 0807) Resp:  [16-17] 17 (11/13 0807) BP: (117-148)/(78-86) 136/84 (11/13 0807) SpO2:  [93 %-97 %] 95 % (11/13 0807) Last BM Date: 03/16/21  Intake/Output from previous day: 11/12 0701 - 11/13 0700 In: 500 [P.O.:400; IV Piggyback:100] Out: 925 [Urine:925] Intake/Output this shift: Total I/O In: -  Out: 400 [Urine:400]   Gen:  Alert, NAD, pleasant Card:  RRR Pulm: CTAB, normal effort on room air Abd: soft, no distension, +BS, very minimal TTP near incisions, lap incisions and midline c/d/i staples removed.  Small seroma evacuated but no signs of redness or purulent drainage.  MSK: no calf edema or TTP bilaterally Skin: warm and dry Lab Results:  Recent Labs    03/20/21 0338 03/21/21 0317  WBC 7.6 7.3  HGB 10.5* 10.9*  HCT 32.9* 35.1*  PLT 429* 474*   BMET Recent Labs    03/19/21 0414  NA 132*  K 4.6  CL 103  CO2 23  GLUCOSE 170*  BUN 23  CREATININE 0.86  CALCIUM 9.0   PT/INR No results for input(s): LABPROT, INR in the last 72 hours. ABG No results for input(s): PHART, HCO3 in the last 72 hours.  Invalid input(s): PCO2, PO2  Studies/Results: CT ABDOMEN PELVIS W CONTRAST  Result Date: 03/19/2021 CLINICAL DATA:  Periumbilical pain, recent surgery for small bowel obstruction EXAM: CT ABDOMEN AND PELVIS WITH CONTRAST TECHNIQUE: Multidetector CT imaging of the abdomen and pelvis was performed using the standard protocol following bolus administration of intravenous contrast. CONTRAST:  OMNIPAQUE IOHEXOL 300 MG/ML  SOLN COMPARISON:  Previous studies including the examination of 03/10/2021 FINDINGS: Lower chest:  There are subpleural blebs and bullae in the lower lung fields. There are linear densities with air bronchogram in right middle lobe and both lower lobes with slight improvement. Hepatobiliary: No focal abnormality is seen in the the liver. Gallbladder is unremarkable. There is no dilation of bile ducts. Pancreas: No focal abnormality is seen. Spleen: Unremarkable. Adrenals/Urinary Tract: Adrenals are not enlarged. There is no hydronephrosis. There are no renal or ureteral stones. Urinary bladder is unremarkable. Stomach/Bowel: Stomach is not distended. There is no significant small bowel dilation. Appendix is not dilated. There is no significant wall thickening in colon. Scattered diverticula are seen in colon without signs of focal diverticulitis. There is 2.8 x 2.4 cm loculated fluid collection anterior to the rectum slightly to the right of midline in the pelvis. There is interval decrease in size since 03/10/2021. Vascular/Lymphatic: Arterial calcifications are seen. No new significant lymphadenopathy seen. Reproductive: Unremarkable. Other: There is no ascites or pneumoperitoneum. Skin staples are seen in the anterior abdominal wall. There is small ventral hernia containing fat in the epigastrium. There is interval appearance of 2.4 cm smooth marginated structure in the subcutaneous plane with fat fluid level. There is another loculated fluid collection measuring 3.3 cm in diameter in the anterior abdominal wall immediately superior to the umbilicus. There are no pockets of air within these fluid collections. There are foci of subcutaneous stranding in the anterior abdominal wall, possibly related to parenteral administration of medications. Musculoskeletal: Degenerative changes are noted in the lumbar spine with no  significant interval change. IMPRESSION: There is interval decrease in size of small loculated fluid collection in the pelvis anterior to the rectum. This fluid collection measures 2.8 cm in  maximum diameter in the current study. No new loculated intra-abdominal fluid collections are seen. There is no evidence of intestinal obstruction or pneumoperitoneum. There is no hydronephrosis. There is new 3.3 cm loculated fluid collection in the subcutaneous plane in the anterior abdominal wall immediately superior to the umbilicus and posterior to the skin staples. This may suggest seroma or an abscess. There are no pockets of air in this fluid collection. There is another smaller loculated fluid collection with fat fluid level in the epigastrium adjacent to a ventral hernia. Possibility of fat necrosis in this region is not excluded. There are patchy linear infiltrates in both lower lung fields suggesting subsegmental atelectasis with slight improvement. Electronically Signed   By: Ernie Avena M.D.   On: 03/19/2021 13:05    Anti-infectives: Anti-infectives (From admission, onward)    Start     Dose/Rate Route Frequency Ordered Stop   03/18/21 2200  cefTRIAXone (ROCEPHIN) 2 g in sodium chloride 0.9 % 100 mL IVPB        2 g 200 mL/hr over 30 Minutes Intravenous Every 24 hours 03/18/21 1514     03/18/21 2200  metroNIDAZOLE (FLAGYL) tablet 500 mg        500 mg Oral Every 12 hours 03/18/21 1514     03/11/21 0900  piperacillin-tazobactam (ZOSYN) IVPB 3.375 g  Status:  Discontinued        3.375 g 12.5 mL/hr over 240 Minutes Intravenous Every 8 hours 03/11/21 0815 03/18/21 1514   03/05/21 0845  cefoTEtan (CEFOTAN) 2 g in sodium chloride 0.9 % 100 mL IVPB        2 g 200 mL/hr over 30 Minutes Intravenous To [facility] 03/04/21 2206 03/05/21 1035       Assessment/Plan: s/p Procedure(s): LAPAROSCOPY DIAGNOSTIC (N/A) EXPLORATORY LAPAROTOMY, REPAIR SMALL BOWEL PERFORATION (Right) LYSIS OF ADHESION   SBO with no previous abdominal surgical history  POD#16 S/P diagnostic laparoscopy, lysis of single adhesive band in the left lower quadrant with resolution of bowel obstruction, mini  laparotomy with primary repair of 2 mm small bowel perforation on 03/05/21 Dr. Fredricka Bonine - CT scan 11/11  improved 2 subcutaneous collections noted - Fluid collection on CT small and not amenable to drainage.       tolerated soft diet.and follow PO intake ~ - Mobilize. Working with PT/OT who are currently recommending CIR. .  - - staples out - SMALL SEROMA NOTED    Doing well surgically. Hopefully stable for discharge to CIR soon as PO intake improves   ID - cefotetan periop 10/28, zosyn 11/3>> VTE - SCDs, sq heparin FEN - IVF, soft, ensure tid, TPN off  Foley - out    DM HTN CAD h/o MI several years ago - followed by Dr. Eldridge Dace PAD HLD Chronic neck pain    LOS: 17 days    Dortha Schwalbe MD  03/21/2021

## 2021-03-21 NOTE — Plan of Care (Signed)

## 2021-03-21 NOTE — Progress Notes (Signed)
Mobility Specialist Criteria Algorithm Info.  Mobility Team: HOB elevated: Activity: Ambulated in hall; Dangled on edge of bed (LE Exercises) Range of motion: Active; All extremities Level of assistance: Contact guard assist, steadying assist Assistive device: Front wheel walker Distance ambulated (ft): 365 ft Mobility response: Tolerated well Bed Position: Semi-fowlers  Patient received in bed. Very eager to participate in mobility. Got to EOB independently and stood with supervision. Ambulated in hallway at min guard with very slow gait, occasionally grimacing from pain. Required seated rest x1. Returned to room and performed LE exercises. Tolerated ambulation and exercises well.Tolerated ambulation well without complaint or incident and was left lying in bed with all needs met.   03/21/2021 3:42 PM

## 2021-03-21 NOTE — Progress Notes (Signed)
PROGRESS NOTE  Daniel Perkins IOX:[id number] DOB: 1956/02/16   PCP: Ellyn Hack, MD  Patient is from: Home  DOA: 03/04/2021 LOS: 17  Chief complaints:  Chief Complaint  Patient presents with   Abdominal Pain     Brief Narrative / Interim history: [name] with PMH of CAD, PAD, DM-2, HTN and HLD presenting with abdominal pain and found to have SBO for which she underwent ex lap with small bowel perforation repair on 03/05/2021.  Postop course complicated by persistent ileus requiring NG tube followed by TPN.  CT abdomen and pelvis on 03/10/2021 with nonspecific thickened and dilated small bowel, possible ileus/partial SBO and small pelvic fluid collection that was not amenable to drainage.  Patient was started on IV Zosyn on 11/3. ID consulted on 11/10 for guidance on his antibiotics.  Repeat CT abdomen and pelvis on 11/11 showed interval decrease in size of pelvic fluid collection but new 3.3 cm loculated fluid in subcutaneous plane in the anterior abdominal wall superior to umbilicus and posterior to skin staples.  Antibiotics de-escalated to IV CTX and Flagyl.  ID recommended CTX and Flagyl for 2 weeks from 11/11, repeat CT abdomen and pelvis in 2 weeks and reconsult based on CT result.  TPN off on 11/12.  Therapy recommended CIR but insurance declined after peer to peer stating patient is not medically ready for discharge.  Expedited appeal in process.  Subjective: Seen and examined earlier this morning.  No major events overnight of this morning.  Has not a bowel movement since he had a smaller one over 2 days ago.  Passing gases.  Tolerating soft diet.  Completely off TPN.  Staples removed this morning.  No further bleeding or drainage from surgical site.  Objective: Vitals:   03/20/21 0820 03/20/21 1605 03/20/21 1935 03/21/21 0807  BP: (!) 157/88 (!) 148/86 117/78 136/84  Pulse: 81 83 77 80  Resp: 16 17 16 17   Temp: 98.4 F (36.9 C) 98.3 F (36.8 C) 100 F (37.8  C) 98 F (36.7 C)  TempSrc: Oral Oral Oral Oral  SpO2: 98% 93% 97% 95%  Weight:      Height:        Intake/Output Summary (Last 24 hours) at 03/21/2021 1242 Last data filed at 03/21/2021 1159 Gross per 24 hour  Intake 440 ml  Output 1200 ml  Net -760 ml   Filed Weights   03/04/21 0256 03/11/21 1400 03/15/21 0411  Weight: 94.8 kg 88.8 kg 87.4 kg    Examination:  GENERAL: No apparent distress.  Nontoxic. HEENT: MMM.  Vision and hearing grossly intact.  NECK: Supple.  No apparent JVD.  RESP: On RA.  No IWOB.  Fair aeration bilaterally. CVS:  RRR. Heart sounds normal.  ABD/GI/GU: BS+. Abd soft, NTND.  Surgical wound DCI. MSK/EXT:  Moves extremities.  Significant muscle mass and subcu fat loss. SKIN: no apparent skin lesion or wound NEURO: Awake and alert. Oriented appropriately.  No apparent focal neuro deficit. PSYCH: Calm. Normal affect.   Procedures:  03/05/2021-diagnostic laparoscopy, lysis of single addition band in LLQ with resolution of SBO, mini laparotomy with primary repair of 2 mm small bowel perforation by Dr. 03/07/2021  Microbiology summarized: COVID-19 and influenza PCR nonreactive. MRSA PCR nonreactive.  Assessment & Plan: SBO due to adhesion s/p laparoscopy, lysis of adhesion and mini laparotomy for small perf repair Persistent postoperative ileus-no BM yet but flatus. Possible enteritis/pelvic abscess in the setting of the above -CT A/P on 11/2 with small  bowel wall thickening and mesenteric edema with dilated bowel proximally raising concern for possible enteritis, inflammatory process or ischemic changes, and new enhancing small fluid collection in pelvis measuring 3.0 x 3.1 x 4.2 cm. -CT A/P on 11/11 howed interval decrease in size of pelvic fluid collection but new 3.3 cm loculated fluid in subcutaneous plane in the anterior abdominal wall superior to umbilicus and posterior to skin staples. -CRP elevated to 9.5.  Leukocytosis resolved. -11/12-Some  bleeding from distal end of surgical wound.  Seems to have some purulence.  Surgery  to assess -Off TPN on 11/12.  Tolerating soft diet. -ID consulted appreciate recs -Repeat CT A/P in 2 weeks, on 04/01/2021 to reassess intra-abdominal fluid collection -IV Zosyn 11/3>> CTX +Flagyl 11/11-11/25.  -Mobilize patient.   Controlled NIDDM-2 with hyperglycemia: A1c 6.0% on 10/27.  On Victoza, Jardiance and glipizide alone. Recent Labs  Lab 03/20/21 1939 03/21/21 0042 03/21/21 0406 03/21/21 0805 03/21/21 1125  GLUCAP 116* 123* 134* 146* 122*  -Continue holding home meds -Continue SSI-resistance -Continue basal insulin at 15 units daily -History of statin intolerance.  AKI/azotemia: Resolved. Recent Labs    03/10/21 0149 03/11/21 0045 03/12/21 0340 03/13/21 0412 03/14/21 0800 03/15/21 0337 03/16/21 0802 03/17/21 0313 03/18/21 0327 03/19/21 0414  BUN 15 15 13 13 16 17 17 19  31* 23  CREATININE 0.97 1.01 0.98 0.91 0.91 0.90 0.92 0.88 1.25* 0.86  -Monitor intermittently  Essential hypertension: Normotensive -Continue Coreg 6.25 mg twice daily. -Continue holding home ARB.   Bilateral feet pain: has history of gout but uric acid normal.  History of neuropathy.  Pain resolved. -Continue with  voltaren gel, colchicine and gabapentin.    History of CAD: No chest pain or anginal symptoms. -Continue home Coreg and aspirin  Hyperlipidemia: Has history of statin intolerance. -Could benefit from PCSK9 inhibitors  Hyponatremia: Improved. -Continue monitoring  Microcytic anemia: H&H relatively stable.  Anemia panel basically normal. Recent Labs    03/12/21 0340 03/13/21 0412 03/14/21 0319 03/15/21 0337 03/16/21 0416 03/17/21 0313 03/18/21 0327 03/19/21 0414 03/20/21 0338 03/21/21 0317  HGB 11.1* 11.4* 11.5* 11.2* 11.6* 11.5* 10.5* 10.9* 10.5* 10.9*  -Continue monitoring  Leukocytosis/bandemia: Likely due to #1.  Resolved. -Antibiotics as above  Physical  deconditioning -Continue PT/OT -Final disposition TBD.  Appealing for CIR.  Moderate malnutrition Body mass index is 30.18 kg/Daniel. Nutrition Problem: Moderate Malnutrition Etiology: acute illness (Bowel obstruction) Signs/Symptoms: mild fat depletion, mild muscle depletion, energy intake < or equal to 50% for > or equal to 5 days Interventions: Ensure Enlive (each supplement provides 350kcal and 20 grams of protein), TPN   DVT prophylaxis:  heparin injection 5,000 Units Start: 03/12/21 1645 Place and maintain sequential compression device Start: 03/10/21 1036  Code Status: Full code Family Communication: None at bedside. Level of care: Med-Surg Status is: Inpatient  Remains inpatient appropriate because: Possible enteritis requiring IV antibiotics, persistent postop ileus requiring TPN and safe disposition     Consultants:  General surgery Infectious disease Interventional radiology   Sch Meds:  Scheduled Meds:  carvedilol  6.25 mg Oral BID AC   Chlorhexidine Gluconate Cloth  6 each Topical Daily   colchicine  0.6 mg Oral Daily   diclofenac Sodium  2 g Topical QID   feeding supplement  237 mL Oral TID BM   gabapentin  100 mg Oral BID   heparin injection (subcutaneous)  5,000 Units Subcutaneous Q8H   insulin aspart  0-20 Units Subcutaneous TID WC   insulin aspart  0-5 Units  Subcutaneous QHS   insulin glargine-yfgn  15 Units Subcutaneous Daily   lidocaine  1 patch Transdermal Q24H   melatonin  6 mg Oral QHS   metroNIDAZOLE  500 mg Oral Q12H   pantoprazole  40 mg Oral BID   polyethylene glycol  17 g Oral BID   sodium chloride flush  10-40 mL Intracatheter Q12H   Continuous Infusions:  cefTRIAXone (ROCEPHIN)  IV Stopped (03/20/21 2220)   methocarbamol (ROBAXIN) IV     PRN Meds:.acetaminophen, hydrALAZINE, HYDROmorphone (DILAUDID) injection, lip balm, methocarbamol (ROBAXIN) IV, nitroGLYCERIN, ondansetron (ZOFRAN) IV, oxyCODONE, phenol, sodium chloride  flush  Antimicrobials: Anti-infectives (From admission, onward)    Start     Dose/Rate Route Frequency Ordered Stop   03/18/21 2200  cefTRIAXone (ROCEPHIN) 2 g in sodium chloride 0.9 % 100 mL IVPB        2 g 200 mL/hr over 30 Minutes Intravenous Every 24 hours 03/18/21 1514     03/18/21 2200  metroNIDAZOLE (FLAGYL) tablet 500 mg        500 mg Oral Every 12 hours 03/18/21 1514     03/11/21 0900  piperacillin-tazobactam (ZOSYN) IVPB 3.375 g  Status:  Discontinued        3.375 g 12.5 mL/hr over 240 Minutes Intravenous Every 8 hours 03/11/21 0815 03/18/21 1514   03/05/21 0845  cefoTEtan (CEFOTAN) 2 g in sodium chloride 0.9 % 100 mL IVPB        2 g 200 mL/hr over 30 Minutes Intravenous To [facility] 03/04/21 2206 03/05/21 1035        I have personally reviewed the following labs and images: CBC: Recent Labs  Lab 03/17/21 0313 03/18/21 0327 03/19/21 0414 03/20/21 0338 03/21/21 0317  WBC 12.6* 11.7* 9.0 7.6 7.3  NEUTROABS 8.1* 7.4 5.2 4.9 3.5  HGB 11.5* 10.5* 10.9* 10.5* 10.9*  HCT 36.9* 34.3* 34.4* 32.9* 35.1*  MCV 70.8* 70.6* 69.9* 69.0* 70.5*  PLT 497* 467* 454* 429* 474*   BMP &GFR Recent Labs  Lab 03/15/21 0337 03/16/21 0802 03/17/21 0313 03/18/21 0327 03/19/21 0414  NA 133* 130* 130* 131* 132*  K 4.5 5.0 4.6 4.9 4.6  CL 101 99 97* 102 103  CO2 25 25 24 24 23   GLUCOSE 165* 218* 199* 202* 170*  BUN 17 17 19  31* 23  CREATININE 0.90 0.92 0.88 1.25* 0.86  CALCIUM 9.1 9.3 9.3 9.1 9.0  MG 2.1  --   --  2.1  --   PHOS 4.2  --   --  4.6 3.7   Estimated Creatinine Clearance: 91.6 mL/min (by C-G formula based on SCr of 0.86 mg/dL). Liver & Pancreas: Recent Labs  Lab 03/15/21 0337 03/18/21 0327 03/19/21 0414  AST 38 37  --   ALT 28 33  --   ALKPHOS 81 84  --   BILITOT 0.6 0.6  --   PROT 7.9 7.8  --   ALBUMIN 2.2* 2.1* 2.1*   No results for input(s): LIPASE, AMYLASE in the last 168 hours. No results for input(s): AMMONIA in the last 168  hours. Diabetic: No results for input(s): HGBA1C in the last 72 hours. Recent Labs  Lab 03/20/21 1939 03/21/21 0042 03/21/21 0406 03/21/21 0805 03/21/21 1125  GLUCAP 116* 123* 134* 146* 122*   Cardiac Enzymes: No results for input(s): CKTOTAL, CKMB, CKMBINDEX, TROPONINI in the last 168 hours. No results for input(s): PROBNP in the last 8760 hours. Coagulation Profile: No results for input(s): INR, PROTIME in the last 168 hours.  Thyroid Function Tests: No results for input(s): TSH, T4TOTAL, FREET4, T3FREE, THYROIDAB in the last 72 hours. Lipid Profile: No results for input(s): CHOL, HDL, LDLCALC, TRIG, CHOLHDL, LDLDIRECT in the last 72 hours.  Anemia Panel: No results for input(s): VITAMINB12, FOLATE, FERRITIN, TIBC, IRON, RETICCTPCT in the last 72 hours.  Urine analysis:    Component Value Date/Time   COLORURINE YELLOW 03/04/2021 0300   APPEARANCEUR CLEAR 03/04/2021 0300   APPEARANCEUR Clear 01/12/2016 1634   LABSPEC 1.020 03/04/2021 0300   PHURINE 5.0 03/04/2021 0300   GLUCOSEU >=500 (A) 03/04/2021 0300   HGBUR NEGATIVE 03/04/2021 0300   BILIRUBINUR NEGATIVE 03/04/2021 0300   BILIRUBINUR negative 12/06/2018 1659   BILIRUBINUR Negative 01/12/2016 1634   KETONESUR 20 (A) 03/04/2021 0300   PROTEINUR NEGATIVE 03/04/2021 0300   UROBILINOGEN 2.0 (A) 12/06/2018 1659   UROBILINOGEN 1 10/23/2013 1555   NITRITE NEGATIVE 03/04/2021 0300   LEUKOCYTESUR NEGATIVE 03/04/2021 0300   Sepsis Labs: Invalid input(s): PROCALCITONIN, [place]  Microbiology: No results found for this or any previous visit (from the past 240 hour(s)).  Radiology Studies: DG Abd 1 View  Result Date: 03/21/2021 CLINICAL DATA:  Abdominal pain, constipation EXAM: ABDOMEN - 1 VIEW COMPARISON:  CT 03/19/2021 FINDINGS: Nonobstructive bowel gas pattern. There is a moderate to large colonic stool burden. No radiopaque calculi over the kidneys or course of the ureters. No acute osseous abnormality.  Multilevel degenerative changes of the spine. Bilateral hip degenerative changes, right worse than left. IMPRESSION: No evidence of bowel obstruction. Moderate to large colonic stool burden. Electronically Signed   By: Maurine Simmering Daniel.D.   On: 03/21/2021 12:08       Marijayne Rauth T. Maxwell  If 7PM-7AM, please contact night-coverage [email or web address] 03/21/2021, 12:42 PM

## 2021-03-22 DIAGNOSIS — E440 Moderate protein-calorie malnutrition: Secondary | ICD-10-CM | POA: Diagnosis not present

## 2021-03-22 DIAGNOSIS — K56609 Unspecified intestinal obstruction, unspecified as to partial versus complete obstruction: Secondary | ICD-10-CM | POA: Diagnosis not present

## 2021-03-22 DIAGNOSIS — M1A9XX0 Chronic gout, unspecified, without tophus (tophi): Secondary | ICD-10-CM | POA: Diagnosis not present

## 2021-03-22 DIAGNOSIS — E785 Hyperlipidemia, unspecified: Secondary | ICD-10-CM | POA: Diagnosis not present

## 2021-03-22 LAB — CBC WITH DIFFERENTIAL/PLATELET
Abs Immature Granulocytes: 0.04 10*3/uL (ref 0.00–0.07)
Basophils Absolute: 0.1 10*3/uL (ref 0.0–0.1)
Basophils Relative: 1 %
Eosinophils Absolute: 0.3 10*3/uL (ref 0.0–0.5)
Eosinophils Relative: 5 %
HCT: 35.5 % — ABNORMAL LOW (ref 39.0–52.0)
Hemoglobin: 10.9 g/dL — ABNORMAL LOW (ref 13.0–17.0)
Immature Granulocytes: 1 %
Lymphocytes Relative: 27 %
Lymphs Abs: 1.8 10*3/uL (ref 0.7–4.0)
MCH: 21.7 pg — ABNORMAL LOW (ref 26.0–34.0)
MCHC: 30.7 g/dL (ref 30.0–36.0)
MCV: 70.7 fL — ABNORMAL LOW (ref 80.0–100.0)
Monocytes Absolute: 1.3 10*3/uL — ABNORMAL HIGH (ref 0.1–1.0)
Monocytes Relative: 20 %
Neutro Abs: 3 10*3/uL (ref 1.7–7.7)
Neutrophils Relative %: 46 %
Platelets: 478 10*3/uL — ABNORMAL HIGH (ref 150–400)
RBC: 5.02 MIL/uL (ref 4.22–5.81)
RDW: 14.6 % (ref 11.5–15.5)
WBC: 6.5 10*3/uL (ref 4.0–10.5)
nRBC: 0 % (ref 0.0–0.2)

## 2021-03-22 LAB — GLUCOSE, CAPILLARY
Glucose-Capillary: 120 mg/dL — ABNORMAL HIGH (ref 70–99)
Glucose-Capillary: 133 mg/dL — ABNORMAL HIGH (ref 70–99)
Glucose-Capillary: 141 mg/dL — ABNORMAL HIGH (ref 70–99)
Glucose-Capillary: 103 mg/dL — ABNORMAL HIGH (ref 70–99)

## 2021-03-22 NOTE — Progress Notes (Signed)
Mobility Specialist Progress Note    03/22/21 1300  Mobility  Activity Transferred to/from [facility]  Level of Assistance Standby assist, set-up cues, supervision of patient - no hands on  Assistive Device  (HHA)  Distance Ambulated (ft) 2 ft  Mobility Out of bed for toileting  Mobility Response Tolerated well  Mobility performed by Mobility specialist  $Mobility charge 1 Mobility   Received pt in chair unsure on whether they needed to complete a BM but wanting to sit on [facility] to try. Completed transfer w/ no complaints, left call bell by side and notified RN.   Frederico Hamman Mobility Specialist Phone Number [phone]

## 2021-03-22 NOTE — Progress Notes (Signed)
PROGRESS NOTE  Daniel Perkins OYD:[id number] DOB: [birth year]   PCP: Ellyn Hack, MD  Patient is from: Home  DOA: 03/04/2021 LOS: 18  Chief complaints:  Chief Complaint  Patient presents with   Abdominal Pain     Brief Narrative / Interim history: 65 year old M with PMH of CAD, PAD, DM-2, HTN and HLD presenting with abdominal pain and found to have SBO for which she underwent ex lap with small bowel perforation repair on 03/05/2021.  Postop course complicated by persistent ileus requiring NG tube followed by TPN.  CT abdomen and pelvis on 03/10/2021 with nonspecific thickened and dilated small bowel, possible ileus/partial SBO and small pelvic fluid collection that was not amenable to drainage.  Patient was started on IV Zosyn on 11/3. ID consulted on 11/10 for guidance on his antibiotics.  Repeat CT abdomen and pelvis on 11/11 showed interval decrease in size of pelvic fluid collection but new 3.3 cm loculated fluid in subcutaneous plane in the anterior abdominal wall superior to umbilicus and posterior to skin staples.  Antibiotics de-escalated to IV CTX and Flagyl.  ID recommended CTX and Flagyl for 2 weeks from 11/11, repeat CT abdomen and pelvis in 2 weeks and reconsult based on CT result.  TPN off on 11/12.  Therapy recommended CIR but insurance declined after peer to peer stating patient is not medically ready for discharge.  Expedited appeal in process.  Subjective: Seen and examined earlier this morning.  No major events overnight of this morning.  Noted some drainage from surgical site last night.  Denies pain.  Passing gas but no bowel movements.  Walked with mobility specialist yesterday.  Objective: Vitals:   03/21/21 2010 03/22/21 0449 03/22/21 0500 03/22/21 0815  BP:  123/77  118/81  Pulse: 75 78  82  Resp: 16 15  18   Temp: 98.2 F (36.8 C) 98.3 F (36.8 C)  98.1 F (36.7 C)  TempSrc: Oral Oral  Oral  SpO2: 96% 96%  97%  Weight:   89.3 kg   Height:         Intake/Output Summary (Last 24 hours) at 03/22/2021 1140 Last data filed at 03/22/2021 0500 Gross per 24 hour  Intake 340 ml  Output 1025 ml  Net -685 ml   Filed Weights   03/11/21 1400 03/15/21 0411 03/22/21 0500  Weight: 88.8 kg 87.4 kg 89.3 kg    Examination:  GENERAL: No apparent distress.  Nontoxic. HEENT: MMM.  Vision and hearing grossly intact.  NECK: Supple.  No apparent JVD.  RESP:  No IWOB.  Fair aeration bilaterally. CVS:  RRR. Heart sounds normal.  ABD/GI/GU: BS+. Abd soft, NTND.  Small opening with thick serosanguineous drainage at distal end of surgical site MSK/EXT:  Moves extremities. Significant muscle mass and subcu fat loss. SKIN: no apparent skin lesion or wound NEURO: Awake and alert. Oriented appropriately.  No apparent focal neuro deficit. PSYCH: Calm. Normal affect.   Procedures:  03/05/2021-diagnostic laparoscopy, lysis of single addition band in LLQ with resolution of SBO, mini laparotomy with primary repair of 2 mm small bowel perforation by Dr. Fredricka Bonine  Microbiology summarized: COVID-19 and influenza PCR nonreactive. MRSA PCR nonreactive.  Assessment & Plan: SBO due to adhesion s/p laparoscopy, lysis of adhesion and mini laparotomy for small perf repair Persistent postoperative ileus-no BM yet but flatus. Possible enteritis/pelvic abscess in the setting of the above -CT A/P on 11/2 with small bowel wall thickening and mesenteric edema with dilated bowel proximally raising concern for possible  enteritis, inflammatory process or ischemic changes, and new enhancing small fluid collection in pelvis measuring 3.0 x 3.1 x 4.2 cm. -CT A/P on 11/11 howed interval decrease in size of pelvic fluid collection but new 3.3 cm loculated fluid in subcutaneous plane in the anterior abdominal wall superior to umbilicus and posterior to skin staples. -CRP elevated to 9.5.  Leukocytosis resolved. -11/12-Some serosanguineous drainage from distal end of surgical  wound-packing and BID WTD dressing -Off TPN on 11/12.  Tolerating soft diet. -ID consulted appreciate recs -Repeat CT A/P in 2 weeks, on 04/01/2021 to reassess intra-abdominal fluid collection -IV Zosyn 11/3>> CTX +Flagyl 11/11-11/25.  -Mobilizing patient.   Controlled NIDDM-2 with hyperglycemia: A1c 6.0% on 10/27.  On Victoza, Jardiance and glipizide alone. Recent Labs  Lab 03/21/21 1125 03/21/21 1555 03/21/21 2015 03/21/21 2358 03/22/21 0856  GLUCAP 122* 153* 138* 145* 120*  -Continue holding home meds -Continue SSI-resistance -Continue basal insulin at 15 units daily -History of statin intolerance.  AKI/azotemia: Resolved. Recent Labs    03/10/21 0149 03/11/21 0045 03/12/21 0340 03/13/21 0412 03/14/21 0800 03/15/21 0337 03/16/21 0802 03/17/21 0313 03/18/21 0327 03/19/21 0414  BUN 15 15 13 13 16 17 17 19  31* 23  CREATININE 0.97 1.01 0.98 0.91 0.91 0.90 0.92 0.88 1.25* 0.86  -Monitor intermittently  Essential hypertension: Normotensive -Continue Coreg 6.25 mg twice daily. -Continue holding home ARB.   Bilateral feet pain: has history of gout but uric acid normal.  History of neuropathy.  Pain resolved. -Continue with  voltaren gel, colchicine and gabapentin.    History of CAD: No chest pain or anginal symptoms. -Continue home Coreg and aspirin  Hyperlipidemia: Has history of statin intolerance. -Could benefit from PCSK9 inhibitors  Hyponatremia: Improved. -Continue monitoring  Microcytic anemia: H&H relatively stable.  Anemia panel basically normal. Recent Labs    03/13/21 0412 03/14/21 0319 03/15/21 0337 03/16/21 0416 03/17/21 0313 03/18/21 0327 03/19/21 0414 03/20/21 0338 03/21/21 0317 03/22/21 0346  HGB 11.4* 11.5* 11.2* 11.6* 11.5* 10.5* 10.9* 10.5* 10.9* 10.9*  -Continue monitoring  Leukocytosis/bandemia: Likely due to #1.  Resolved. -Antibiotics as above  Physical deconditioning -Continue PT/OT -Final disposition TBD.  Appealing for  CIR.  Moderate malnutrition: As evidenced by significant muscle mass and subcu fat loss in lower extremities Body mass index is 30.83 kg/m. Nutrition Problem: Moderate Malnutrition Etiology: acute illness (Bowel obstruction) Signs/Symptoms: mild fat depletion, mild muscle depletion, energy intake < or equal to 50% for > or equal to 5 days Interventions: Ensure Enlive (each supplement provides 350kcal and 20 grams of protein), TPN   DVT prophylaxis:  heparin injection 5,000 Units Start: 03/12/21 1645 Place and maintain sequential compression device Start: 03/10/21 1036  Code Status: Full code Family Communication: None at bedside. Level of care: Med-Surg Status is: Inpatient  Remains inpatient appropriate because: Possible enteritis requiring IV antibiotics, protracted postop ileus and safe disposition     Consultants:  General surgery Infectious disease Interventional radiology   Sch Meds:  Scheduled Meds:  carvedilol  6.25 mg Oral BID AC   Chlorhexidine Gluconate Cloth  6 each Topical Daily   colchicine  0.6 mg Oral Daily   diclofenac Sodium  2 g Topical QID   feeding supplement  237 mL Oral TID BM   gabapentin  100 mg Oral BID   heparin injection (subcutaneous)  5,000 Units Subcutaneous Q8H   insulin aspart  0-20 Units Subcutaneous TID WC   insulin aspart  0-5 Units Subcutaneous QHS   insulin glargine-yfgn  15 Units  Subcutaneous Daily   lidocaine  1 patch Transdermal Q24H   melatonin  6 mg Oral QHS   metroNIDAZOLE  500 mg Oral Q12H   pantoprazole  40 mg Oral BID   polyethylene glycol  17 g Oral BID   sodium chloride flush  10-40 mL Intracatheter Q12H   Continuous Infusions:  cefTRIAXone (ROCEPHIN)  IV 2 g (03/21/21 2050)   methocarbamol (ROBAXIN) IV     PRN Meds:.acetaminophen, hydrALAZINE, HYDROmorphone (DILAUDID) injection, lip balm, methocarbamol (ROBAXIN) IV, nitroGLYCERIN, ondansetron (ZOFRAN) IV, oxyCODONE, phenol, sodium chloride  flush  Antimicrobials: Anti-infectives (From admission, onward)    Start     Dose/Rate Route Frequency Ordered Stop   03/18/21 2200  cefTRIAXone (ROCEPHIN) 2 g in sodium chloride 0.9 % 100 mL IVPB        2 g 200 mL/hr over 30 Minutes Intravenous Every 24 hours 03/18/21 1514     03/18/21 2200  metroNIDAZOLE (FLAGYL) tablet 500 mg        500 mg Oral Every 12 hours 03/18/21 1514     03/11/21 0900  piperacillin-tazobactam (ZOSYN) IVPB 3.375 g  Status:  Discontinued        3.375 g 12.5 mL/hr over 240 Minutes Intravenous Every 8 hours 03/11/21 0815 03/18/21 1514   03/05/21 0845  cefoTEtan (CEFOTAN) 2 g in sodium chloride 0.9 % 100 mL IVPB        2 g 200 mL/hr over 30 Minutes Intravenous To [facility] 03/04/21 2206 03/05/21 1035        I have personally reviewed the following labs and images: CBC: Recent Labs  Lab 03/18/21 0327 03/19/21 0414 03/20/21 0338 03/21/21 0317 03/22/21 0346  WBC 11.7* 9.0 7.6 7.3 6.5  NEUTROABS 7.4 5.2 4.9 3.5 3.0  HGB 10.5* 10.9* 10.5* 10.9* 10.9*  HCT 34.3* 34.4* 32.9* 35.1* 35.5*  MCV 70.6* 69.9* 69.0* 70.5* 70.7*  PLT 467* 454* 429* 474* 478*   BMP &GFR Recent Labs  Lab 03/16/21 0802 03/17/21 0313 03/18/21 0327 03/19/21 0414  NA 130* 130* 131* 132*  K 5.0 4.6 4.9 4.6  CL 99 97* 102 103  CO2 25 24 24 23   GLUCOSE 218* 199* 202* 170*  BUN 17 19 31* 23  CREATININE 0.92 0.88 1.25* 0.86  CALCIUM 9.3 9.3 9.1 9.0  MG  --   --  2.1  --   PHOS  --   --  4.6 3.7   Estimated Creatinine Clearance: 92.5 mL/min (by C-G formula based on SCr of 0.86 mg/dL). Liver & Pancreas: Recent Labs  Lab 03/18/21 0327 03/19/21 0414  AST 37  --   ALT 33  --   ALKPHOS 84  --   BILITOT 0.6  --   PROT 7.8  --   ALBUMIN 2.1* 2.1*   No results for input(s): LIPASE, AMYLASE in the last 168 hours. No results for input(s): AMMONIA in the last 168 hours. Diabetic: No results for input(s): HGBA1C in the last 72 hours. Recent Labs  Lab 03/21/21 1125  03/21/21 1555 03/21/21 2015 03/21/21 2358 03/22/21 0856  GLUCAP 122* 153* 138* 145* 120*   Cardiac Enzymes: No results for input(s): CKTOTAL, CKMB, CKMBINDEX, TROPONINI in the last 168 hours. No results for input(s): PROBNP in the last 8760 hours. Coagulation Profile: No results for input(s): INR, PROTIME in the last 168 hours. Thyroid Function Tests: No results for input(s): TSH, T4TOTAL, FREET4, T3FREE, THYROIDAB in the last 72 hours. Lipid Profile: No results for input(s): CHOL, HDL, LDLCALC, TRIG, CHOLHDL,  LDLDIRECT in the last 72 hours.  Anemia Panel: No results for input(s): VITAMINB12, FOLATE, FERRITIN, TIBC, IRON, RETICCTPCT in the last 72 hours.  Urine analysis:    Component Value Date/Time   COLORURINE YELLOW 03/04/2021 0300   APPEARANCEUR CLEAR 03/04/2021 0300   APPEARANCEUR Clear 01/12/2016 1634   LABSPEC 1.020 03/04/2021 0300   PHURINE 5.0 03/04/2021 0300   GLUCOSEU >=500 (A) 03/04/2021 0300   HGBUR NEGATIVE 03/04/2021 0300   BILIRUBINUR NEGATIVE 03/04/2021 0300   BILIRUBINUR negative 12/06/2018 1659   BILIRUBINUR Negative 01/12/2016 1634   KETONESUR 20 (A) 03/04/2021 0300   PROTEINUR NEGATIVE 03/04/2021 0300   UROBILINOGEN 2.0 (A) 12/06/2018 1659   UROBILINOGEN 1 10/23/2013 1555   NITRITE NEGATIVE 03/04/2021 0300   LEUKOCYTESUR NEGATIVE 03/04/2021 0300   Sepsis Labs: Invalid input(s): PROCALCITONIN, [place]  Microbiology: No results found for this or any previous visit (from the past 240 hour(s)).  Radiology Studies: No results found.     Brynna Dobos T. [place]  If 7PM-7AM, please contact night-coverage [email or web address] 03/22/2021, 11:40 AM

## 2021-03-22 NOTE — Progress Notes (Addendum)
Occupational Therapy Treatment Patient Details Name: Daniel Perkins MRN: [id number] DOB: 1955/08/19 Today's Date: 03/22/2021   History of present illness Pt is 65 y/o M admitted to ED for acute abdominal pain on 03/04/21, found to have small bowel obstruction. He is S/p ex lap, small bowel perforation repair on 10/28. PMH includes CAD/MI, gout, CKD, HTN, DM and ACDF.   OT comments  Pt with more fatigue and pain today, motivated to walk with therapy. Able to walk hallway distance with RW, v/c's to remain upright. Pt able to complete LB ADL with min guard- mod A, able to reach down to pull shoes on and pull pants on using sit-to-stand technique. Min guard for transfers. Pt provided with UE theraband exercises to perform when in bed/chair, while adhering to abdominal precautions. Educated pt on modifications to make to exercise when in bed vs. Chair. Pt demonstrates and verbalizes understanding. Pt presents with impaired activity tolerance, balance, pain, and ROM at this time, will continue to follow acutely. Pt to benefit from CIR at d/c.   Recommendations for follow up therapy are one component of a multi-disciplinary discharge planning process, led by the attending physician.  Recommendations may be updated based on patient status, additional functional criteria and insurance authorization.    Follow Up Recommendations  Acute inpatient rehab (3hours/day)    Assistance Recommended at Discharge Intermittent Supervision/Assistance  Equipment Recommendations  None recommended by OT    Recommendations for Other Services PT consult    Precautions / Restrictions Precautions Precautions: Other (comment) Precaution Comments: abdominal precautions Restrictions Weight Bearing Restrictions: No Other Position/Activity Restrictions: no lifting >10lbs       Mobility Bed Mobility Overal bed mobility: Needs Assistance Bed Mobility: Sidelying to Sit;Rolling Rolling: Min guard Sidelying to sit:  Min guard       General bed mobility comments: Pt. is reminded to roll to side prior to attempting to sit up to decrease stress on abdomen.  Pt req's CGA/VCs to correctly complete task.  Able to scoot to EOB with independence.    Transfers Overall transfer level: Needs assistance Equipment used: Rolling walker (2 wheels) Transfers: Sit to/from Stand Sit to Stand: Min guard           General transfer comment: uses rocking technique to lean forward, increased time to sit/stand, often looks downward when ambulating with RW, v/cing to remain upright     Balance Overall balance assessment: Needs assistance Sitting-balance support: Feet supported;Bilateral upper extremity supported Sitting balance-Leahy Scale: Good     Standing balance support: Reliant on assistive device for balance;During functional activity Standing balance-Leahy Scale: Poor                             ADL either performed or assessed with clinical judgement   ADL Overall ADL's : Needs assistance/impaired                     Lower Body Dressing: Moderate assistance;Sitting/lateral leans;Min guard Lower Body Dressing Details (indicate cue type and reason): able to pull up shoe once placed on foot, dons pants with min guard when moving from sit to stand Toilet Transfer: Min guard;Ambulation;Rolling walker (2 wheels) Toilet Transfer Details (indicate cue type and reason): simulated transfer to chair                Extremity/Trunk Assessment Upper Extremity Assessment Upper Extremity Assessment: Overall WFL for tasks assessed   Lower Extremity Assessment Lower Extremity  Assessment: Defer to PT evaluation        Vision   Vision Assessment?: No apparent visual deficits   Perception Perception Perception: Within Functional Limits   Praxis Praxis Praxis: Not tested    Cognition Arousal/Alertness: Awake/alert Behavior During Therapy: WFL for tasks assessed/performed Overall  Cognitive Status: Within Functional Limits for tasks assessed                                 General Comments: pt states abdominal wound was packed this morning          Exercises General Exercises - Upper Extremity Shoulder Flexion: Theraband;10 reps;Seated Theraband Level (Shoulder Flexion): Level 3 (Green) Elbow Flexion: Theraband;10 reps;Seated Theraband Level (Elbow Flexion): Level 3 (Green) Elbow Extension: 10 reps;Seated;Theraband Other Exercises Other Exercises: Pt. demos ankle pumps, LAQ and marching with independence.  Informed pt to dec amount of hip flex with marching for abdominal comfort.   Shoulder Instructions       General Comments Pt is frustrated with dec gait distance today.  PT explains to pt. to not get discouraged as recovery is rarely a linear process and pt. just had procedure this AM causing inc pain.  Pt. demos understanding.  PT discusses that pt. will have another attempt to amb this PM with mobility team.  Pt. therex is also reviewed and pt. is encouraged to continue to do therex to tolerance for LE strengthening.    Pertinent Vitals/ Pain       Pain Assessment: 0-10 Pain Score: 3  Faces Pain Scale: Hurts little more Pain Location: Abdomen Pain Descriptors / Indicators: Aching;Tender;Discomfort Pain Intervention(s): Limited activity within patient's tolerance;Monitored during session;Repositioned  Home Living                                          Prior Functioning/Environment              Frequency  Min 2X/week        Progress Toward Goals  OT Goals(current goals can now be found in the care plan section)  Progress towards OT goals: Progressing toward goals  Acute Rehab OT Goals Patient Stated Goal: reduce pain OT Goal Formulation: With patient Time For Goal Achievement: 03/23/21 Potential to Achieve Goals: Good ADL Goals Pt Will Perform Upper Body Dressing: sitting;with supervision Pt Will  Perform Lower Body Dressing: sit to/from stand;with min guard assist Pt Will Transfer to Toilet: regular height toilet;with min guard assist Pt Will Perform Tub/Shower Transfer: with min assist  Plan Discharge plan remains appropriate;Frequency remains appropriate    Co-evaluation                 AM-PAC OT "6 Clicks" Daily Activity     Outcome Measure   Help from another person eating meals?: None Help from another person taking care of personal grooming?: A Little Help from another person toileting, which includes using toliet, bedpan, or urinal?: A Little Help from another person bathing (including washing, rinsing, drying)?: A Lot Help from another person to put on and taking off regular upper body clothing?: A Little Help from another person to put on and taking off regular lower body clothing?: A Little 6 Click Score: 18    End of Session Equipment Utilized During Treatment: Gait belt;Rolling walker (2 wheels)  OT Visit Diagnosis: Unsteadiness on  feet (R26.81);Other abnormalities of gait and mobility (R26.89);Muscle weakness (generalized) (M62.81);Pain   Activity Tolerance Patient tolerated treatment well   Patient Left in chair;with chair alarm set;with call bell/phone within reach;Other (comment) (mobility tech in room)   Nurse Communication Mobility status;Patient requests pain meds        Time: 3267-1245 OT Time Calculation (min): 53 min  Charges: OT General Charges $OT Visit: 1 Visit OT Treatments $Self Care/Home Management : 23-37 mins $Therapeutic Activity: 23-37 mins  Alfonzo Beers, OTD, OTR/L Acute Rehab [phone]) [phone]   Mayer Masker 03/22/2021, 2:39 PM

## 2021-03-22 NOTE — Progress Notes (Signed)
Inpatient Rehab Admissions Coordinator:   Awaiting determination from [facility] Medicare on expedited appeal filed Friday.  Will continue to follow.   Estill Dooms, PT, DPT Admissions Coordinator [phone] 03/22/21  10:44 AM

## 2021-03-22 NOTE — Progress Notes (Signed)
Physical Therapy Treatment Patient Details Name: Daniel Perkins MRN: [id number] DOB: [birth year] Today's Date: 03/22/2021   History of Present Illness Pt is 65 y/o M admitted to ED for acute abdominal pain on 03/04/21, found to have small bowel obstruction. He is S/p ex lap, small bowel perforation repair on 10/28. PMH includes CAD/MI, gout, CKD, HTN, DM and ACDF.    PT Comments    Pt demos dec activity tolerance today with gait training secondary to inc pain following wound being repacked this AM.  Pt is primarily limited by pain, SOB and dec balance secondary to pronounced forward flexion of trunk.  Pt encouraged to try to improve upright posture with activity.  Mobility team to check on pt later today to see if gait distance/posture improves once pain is under control.  AMPAC score is indicating home with HH, however, with decline in balance during AM tx today, pt would be unsafe to mobilize without assist at home.  In order to safely return home, daughter would need to be available to provide min A with OOB activity.   Recommendations for follow up therapy are one component of a multi-disciplinary discharge planning process, led by the attending physician.  Recommendations may be updated based on patient status, additional functional criteria and insurance authorization.  Follow Up Recommendations  Acute inpatient rehab (3hours/day)     Assistance Recommended at Discharge Frequent or constant Supervision/Assistance  Equipment Recommendations  Rolling walker (2 wheels);BSC/3in1    Recommendations for Other Services       Precautions / Restrictions Precautions Precautions: Other (comment) Precaution Comments: abdominal precautions Restrictions Weight Bearing Restrictions: No Other Position/Activity Restrictions: no lifting >10lbs     Mobility  Bed Mobility Overal bed mobility: Needs Assistance Bed Mobility: Sidelying to Sit;Rolling Rolling: Min guard Sidelying to sit: Min  guard       General bed mobility comments: Pt. is reminded to roll to side prior to attempting to sit up to decrease stress on abdomen.  Pt req's CGA/VCs to correctly complete task.  Able to scoot to EOB with independence. Patient Response: Cooperative  Transfers Overall transfer level: Modified independent Equipment used: Rolling walker (2 wheels) Transfers: Sit to/from Stand Sit to Stand: Supervision           General transfer comment: Pt. completes sit > stand with supervision only.  Utilizes rocking technique to build momentum.  PT assist pt. with donning pants and shoes prior to standing.    Ambulation/Gait Ambulation/Gait assistance: Min assist Gait Distance (Feet): 60 Feet Assistive device: Rolling walker (2 wheels) Gait Pattern/deviations: Decreased step length - right;Decreased step length - left;Trunk flexed;Shuffle       General Gait Details: Pt. demos slow cadence with gait with forward flexed trunk and dec foot clearance.  Requires VCs to attempt to improve upright posture with activity and to prevent forward LOB.  Pt experiences SOB with activity and dec balance resulting in dec amb distance.   Stairs             Wheelchair Mobility    Modified Rankin (Stroke Patients Only)       Balance Overall balance assessment: Needs assistance           Standing balance-Leahy Scale: Poor                              Cognition Arousal/Alertness: Awake/alert Behavior During Therapy: WFL for tasks assessed/performed Overall Cognitive Status: Within Functional Limits for  tasks assessed                                 General Comments: Pt. is supine in bed when PT arrives. Daughter is present in room.  Pt states that stomach is more painful today secondary to MD cleaning and repacking wound this AM.  Agreeable to trial activity.        Exercises Other Exercises Other Exercises: Pt. demos ankle pumps, LAQ and marching with  independence.  Informed pt to dec amount of hip flex with marching for abdominal comfort.    General Comments General comments (skin integrity, edema, etc.): Pt is frustrated with dec gait distance today.  PT explains to pt. to not get discouraged as recovery is rarely a linear process and pt. just had procedure this AM causing inc pain.  Pt. demos understanding.  PT discusses that pt. will have another attempt to amb this PM with mobility team.  Pt. therex is also reviewed and pt. is encouraged to continue to do therex to tolerance for LE strengthening.      Pertinent Vitals/Pain Pain Assessment: 0-10 Pain Score: 6  Pain Location: Abdomen Pain Descriptors / Indicators: Aching;Tender;Discomfort Pain Intervention(s): Limited activity within patient's tolerance;Monitored during session;Repositioned    Home Living                          Prior Function            PT Goals (current goals can now be found in the care plan section) Progress towards PT goals: Progressing toward goals    Frequency           PT Plan Current plan remains appropriate    Co-evaluation              AM-PAC PT "6 Clicks" Mobility   Outcome Measure  Help needed turning from your back to your side while in a flat bed without using bedrails?: A Little Help needed moving from lying on your back to sitting on the side of a flat bed without using bedrails?: A Little Help needed moving to and from a bed to a chair (including a wheelchair)?: A Little Help needed standing up from a chair using your arms (e.g., wheelchair or bedside chair)?: A Little Help needed to walk in hospital room?: A Little Help needed climbing 3-5 steps with a railing? : A Lot 6 Click Score: 17    End of Session Equipment Utilized During Treatment: Gait belt Activity Tolerance: Patient tolerated treatment well;Patient limited by fatigue;Patient limited by pain Patient left: in chair;with chair alarm set;with  family/visitor present         Time: 1040-1100 PT Time Calculation (min) (ACUTE ONLY): 20 min  Charges:  $Gait Training: 8-22 mins                    [name], PT, DPT Acute Rehabilitation Services Office: [phone]    [name] 03/22/2021, 11:11 AM

## 2021-03-22 NOTE — Progress Notes (Signed)
Inpatient Rehab Admissions Coordinator:   I did win expedited appeal for this patient. I will not have a bed today or tomorrow.  Will f/u on Wednesday.  Estill Dooms, PT, DPT Admissions Coordinator [phone] 03/22/21  5:01 PM

## 2021-03-22 NOTE — Progress Notes (Signed)
Progress Note  17 Days Post-Op  Subjective: No acute changes. Still no BM but passing flatus. Had drainage from midline incision yesterday. No nausea or emesis. Ambulating. Pain controlled   Objective: Vital signs in last 24 hours: Temp:  [97.9 F (36.6 C)-98.3 F (36.8 C)] 98.3 F (36.8 C) (11/14 0449) Pulse Rate:  [75-84] 78 (11/14 0449) Resp:  [15-17] 15 (11/14 0449) BP: (120-123)/(77-84) 123/77 (11/14 0449) SpO2:  [96 %-97 %] 96 % (11/14 0449) Weight:  [89.3 kg] 89.3 kg (11/14 0500) Last BM Date: 03/18/21  Intake/Output from previous day: 11/13 0701 - 11/14 0700 In: 520 [P.O.:420; IV Piggyback:100] Out: 1425 [Urine:1425] Intake/Output this shift: No intake/output data recorded.  PE: General: pleasant, WD, male who is laying in bed in NAD HEENT: head is normocephalic, atraumatic.  Mouth is pink and moist Heart: regular, rate, and rhythm.   Palpable radial and pedal pulses bilaterally Lungs: CTAB. Respiratory effort nonlabored Abd: soft, ND, +BS, very mild TTP without rebound or gaurding over midline incision. Incision with distal portion skin open (2x2cm) draining purulent bloody fluid. Cavity tracks 5 cm deep at caudal apex of wound and 7 cm caudally under epidermis along incision line MSK: all 4 extremities are symmetrical with no cyanosis, clubbing, or edema. Skin: warm and dry with no masses, lesions, or rashes Psych: A&Ox3 with an appropriate affect.    Lab Results:  Recent Labs    03/21/21 0317 03/22/21 0346  WBC 7.3 6.5  HGB 10.9* 10.9*  HCT 35.1* 35.5*  PLT 474* 478*   BMET No results for input(s): NA, K, CL, CO2, GLUCOSE, BUN, CREATININE, CALCIUM in the last 72 hours. PT/INR No results for input(s): LABPROT, INR in the last 72 hours. CMP     Component Value Date/Time   NA 132 (L) 03/19/2021 0414   NA 137 02/10/2021 1538   K 4.6 03/19/2021 0414   CL 103 03/19/2021 0414   CO2 23 03/19/2021 0414   GLUCOSE 170 (H) 03/19/2021 0414   BUN 23  03/19/2021 0414   BUN 13 02/10/2021 1538   CREATININE 0.86 03/19/2021 0414   CREATININE 1.22 06/30/2014 1634   CALCIUM 9.0 03/19/2021 0414   PROT 7.8 03/18/2021 0327   PROT 7.2 12/06/2018 1648   ALBUMIN 2.1 (L) 03/19/2021 0414   ALBUMIN 4.2 12/06/2018 1648   AST 37 03/18/2021 0327   ALT 33 03/18/2021 0327   ALKPHOS 84 03/18/2021 0327   BILITOT 0.6 03/18/2021 0327   BILITOT 0.3 12/06/2018 1648   GFRNONAA >60 03/19/2021 0414   GFRNONAA 78 10/23/2013 1554   GFRAA 86 06/09/2020 1508   GFRAA >89 10/23/2013 1554   Lipase  No results found for: LIPASE     Studies/Results: DG Abd 1 View  Result Date: 03/21/2021 CLINICAL DATA:  Abdominal pain, constipation EXAM: ABDOMEN - 1 VIEW COMPARISON:  CT 03/19/2021 FINDINGS: Nonobstructive bowel gas pattern. There is a moderate to large colonic stool burden. No radiopaque calculi over the kidneys or course of the ureters. No acute osseous abnormality. Multilevel degenerative changes of the spine. Bilateral hip degenerative changes, right worse than left. IMPRESSION: No evidence of bowel obstruction. Moderate to large colonic stool burden. Electronically Signed   By: Caprice Renshaw M.D.   On: 03/21/2021 12:08    Anti-infectives: Anti-infectives (From admission, onward)    Start     Dose/Rate Route Frequency Ordered Stop   03/18/21 2200  cefTRIAXone (ROCEPHIN) 2 g in sodium chloride 0.9 % 100 mL IVPB  2 g 200 mL/hr over 30 Minutes Intravenous Every 24 hours 03/18/21 1514     03/18/21 2200  metroNIDAZOLE (FLAGYL) tablet 500 mg        500 mg Oral Every 12 hours 03/18/21 1514     03/11/21 0900  piperacillin-tazobactam (ZOSYN) IVPB 3.375 g  Status:  Discontinued        3.375 g 12.5 mL/hr over 240 Minutes Intravenous Every 8 hours 03/11/21 0815 03/18/21 1514   03/05/21 0845  cefoTEtan (CEFOTAN) 2 g in sodium chloride 0.9 % 100 mL IVPB        2 g 200 mL/hr over 30 Minutes Intravenous To [facility] 03/04/21 2206 03/05/21 1035         Assessment/Plan  SBO with no previous abdominal surgical history  POD#17 S/P diagnostic laparoscopy, lysis of single adhesive band in the left lower quadrant with resolution of bowel obstruction, mini laparotomy with primary repair of 2 mm small bowel perforation on 03/05/21 Dr. Fredricka Bonine - CT scan 11/11  improved 2 subcutaneous collections noted - Fluid collection on CT small and not amenable to drainage.  - tolerating soft diet. TPN stopped - Mobilize. Working with PT/OT who are currently recommending CIR. - staples out 11/13 - small seroma noted. Wound cavity probed today and packed. Continue bid wet to dry dressing changes. Tolerated wound packing well today   ID - cefotetan periop 10/28, zosyn 11/3>11/10. Rocephin/flagyl 11/10>> VTE - SCDs, sq heparin FEN - IVF, soft, ensure tid, TPN off  Foley - out    DM HTN CAD h/o MI several years ago - followed by Dr. Eldridge Dace PAD HLD Chronic neck pain   LOS: 18 days    Eric Form, [facility] 03/22/2021, 8:07 AM Please see Amion for pager number during day hours 7:00am-4:30pm

## 2021-03-23 DIAGNOSIS — M1A9XX0 Chronic gout, unspecified, without tophus (tophi): Secondary | ICD-10-CM | POA: Diagnosis not present

## 2021-03-23 DIAGNOSIS — E785 Hyperlipidemia, unspecified: Secondary | ICD-10-CM | POA: Diagnosis not present

## 2021-03-23 DIAGNOSIS — K56609 Unspecified intestinal obstruction, unspecified as to partial versus complete obstruction: Secondary | ICD-10-CM | POA: Diagnosis not present

## 2021-03-23 DIAGNOSIS — E440 Moderate protein-calorie malnutrition: Secondary | ICD-10-CM | POA: Diagnosis not present

## 2021-03-23 LAB — CBC WITH DIFFERENTIAL/PLATELET
Abs Immature Granulocytes: 0.02 10*3/uL (ref 0.00–0.07)
Basophils Absolute: 0.1 10*3/uL (ref 0.0–0.1)
Basophils Relative: 1 %
Eosinophils Absolute: 0.1 10*3/uL (ref 0.0–0.5)
Eosinophils Relative: 2 %
HCT: 33.6 % — ABNORMAL LOW (ref 39.0–52.0)
Hemoglobin: 10.4 g/dL — ABNORMAL LOW (ref 13.0–17.0)
Immature Granulocytes: 0 %
Lymphocytes Relative: 20 %
Lymphs Abs: 1.2 10*3/uL (ref 0.7–4.0)
MCH: 21.7 pg — ABNORMAL LOW (ref 26.0–34.0)
MCHC: 31 g/dL (ref 30.0–36.0)
MCV: 70.1 fL — ABNORMAL LOW (ref 80.0–100.0)
Monocytes Absolute: 0.9 10*3/uL (ref 0.1–1.0)
Monocytes Relative: 15 %
Neutro Abs: 3.8 10*3/uL (ref 1.7–7.7)
Neutrophils Relative %: 62 %
Platelets: 440 10*3/uL — ABNORMAL HIGH (ref 150–400)
RBC: 4.79 MIL/uL (ref 4.22–5.81)
RDW: 14.6 % (ref 11.5–15.5)
WBC: 6 10*3/uL (ref 4.0–10.5)
nRBC: 0 % (ref 0.0–0.2)

## 2021-03-23 LAB — GLUCOSE, CAPILLARY
Glucose-Capillary: 128 mg/dL — ABNORMAL HIGH (ref 70–99)
Glucose-Capillary: 183 mg/dL — ABNORMAL HIGH (ref 70–99)
Glucose-Capillary: 125 mg/dL — ABNORMAL HIGH (ref 70–99)
Glucose-Capillary: 155 mg/dL — ABNORMAL HIGH (ref 70–99)

## 2021-03-23 MED ORDER — ADULT MULTIVITAMIN W/MINERALS CH
1.0000 | ORAL_TABLET | Freq: Every day | ORAL | Status: DC
  Administered 2021-03-23 – 2021-03-25 (×3): 1 via ORAL
  Filled 2021-03-23 (×2): qty 1

## 2021-03-23 NOTE — Progress Notes (Signed)
PROGRESS NOTE  Daniel Perkins WUJ:[id number] DOB: 1955/06/01   PCP: Ellyn Hack, MD  Patient is from: Home  DOA: 03/04/2021 LOS: 19  Chief complaints:  Chief Complaint  Patient presents with   Abdominal Pain     Brief Narrative / Interim history: 65 year old M with PMH of CAD, PAD, DM-2, HTN and HLD presenting with abdominal pain and found to have SBO for which she underwent ex lap with small bowel perforation repair on 03/05/2021.  Postop course complicated by persistent ileus requiring NG tube followed by TPN.  CT abdomen and pelvis on 03/10/2021 with nonspecific thickened and dilated small bowel, possible ileus/partial SBO and small pelvic fluid collection that was not amenable to drainage.  Patient was started on IV Zosyn on 11/3. ID consulted on 11/10 for guidance on his antibiotics.  Repeat CT abdomen and pelvis on 11/11 showed interval decrease in size of pelvic fluid collection but new 3.3 cm loculated fluid in subcutaneous plane in the anterior abdominal wall superior to umbilicus and posterior to skin staples.  Antibiotics de-escalated to IV CTX and Flagyl.  ID recommended CTX and Flagyl for 2 weeks from 11/11, repeat CT abdomen and pelvis in 2 weeks and reconsult based on CT result.  TPN off on 11/12.  Plan for transfer to CIR once bed available.  Subjective: Seen and examined earlier this morning.  No major events overnight of this morning.  Reports about 4 bowel movements since yesterday.  He says he had a large BM last night.  Denies abdominal pain.  Asking if I could complete his daughter's FMLA paper.   Objective: Vitals:   03/22/21 2036 03/23/21 0500 03/23/21 0535 03/23/21 0756  BP: 95/67 91/63 102/72 108/72  Pulse: 85 73 75 85  Resp: 17 18 17    Temp: 98.5 F (36.9 C) 98.3 F (36.8 C) 97.9 F (36.6 C) 97.7 F (36.5 C)  TempSrc:   Oral   SpO2: 96% 93% 96% 100%  Weight:      Height:        Intake/Output Summary (Last 24 hours) at 03/23/2021 1440 Last  data filed at 03/23/2021 1413 Gross per 24 hour  Intake 600 ml  Output 850 ml  Net -250 ml   Filed Weights   03/11/21 1400 03/15/21 0411 03/22/21 0500  Weight: 88.8 kg 87.4 kg 89.3 kg    Examination:  GENERAL: No apparent distress.  Nontoxic. HEENT: MMM.  Vision and hearing grossly intact.  NECK: Supple.  No apparent JVD.  RESP:  No IWOB.  Fair aeration bilaterally. CVS:  RRR. Heart sounds normal.  ABD/GI/GU: BS+. Abd soft, NTND.  Dressing DCI. MSK/EXT:  Moves extremities.  Significant muscle mass and subcu fat loss. SKIN: no apparent skin lesion or wound NEURO: Awake and alert. Oriented appropriately.  No apparent focal neuro deficit. PSYCH: Calm. Normal affect.   Procedures:  03/05/2021-diagnostic laparoscopy, lysis of single addition band in LLQ with resolution of SBO, mini laparotomy with primary repair of 2 mm small bowel perforation by Dr. 03/07/2021  Microbiology summarized: COVID-19 and influenza PCR nonreactive. MRSA PCR nonreactive.  Assessment & Plan: SBO due to adhesion s/p laparoscopy, lysis of adhesion and mini laparotomy for small perf repair Persistent postoperative ileus-seems to have resolved.  Possible enteritis/pelvic abscess in the setting of the above -CT A/P on 11/2 with small bowel wall thickening and mesenteric edema with dilated bowel proximally raising concern for possible enteritis, inflammatory process or ischemic changes, and new enhancing small fluid collection in pelvis  measuring 3.0 x 3.1 x 4.2 cm. -CT A/P on 11/11 howed interval decrease in size of pelvic fluid collection but new 3.3 cm loculated fluid in subcutaneous plane in the anterior abdominal wall superior to umbilicus and posterior to skin staples. -CRP elevated to 9.5.  Leukocytosis resolved. -11/12-some serosanguineous drainage from distal end of surgical wound-packing and BID WTD dressing -Off TPN on 11/12.  Tolerating soft diet. -ID consulted appreciate recs -Repeat CT A/P in 2 weeks,  on 04/01/2021 to reassess intra-abdominal fluid collection -IV Zosyn 11/3>> CTX +Flagyl 11/11-11/25.  -Mobilizing patient.   Controlled NIDDM-2 with hyperglycemia: A1c 6.0% on 10/27.  On Victoza, Jardiance and glipizide alone. Recent Labs  Lab 03/22/21 1158 03/22/21 1643 03/22/21 2035 03/23/21 0757 03/23/21 1207  GLUCAP 133* 141* 103* 128* 183*  -Continue holding home meds -Continue SSI-resistance -Continue basal insulin at 15 units daily -History of statin intolerance.  AKI/azotemia: Resolved. Recent Labs    03/10/21 0149 03/11/21 0045 03/12/21 0340 03/13/21 0412 03/14/21 0800 03/15/21 0337 03/16/21 0802 03/17/21 0313 03/18/21 0327 03/19/21 0414  BUN 15 15 13 13 16 17 17 19  31* 23  CREATININE 0.97 1.01 0.98 0.91 0.91 0.90 0.92 0.88 1.25* 0.86  -Monitor intermittently  Essential hypertension: Normotensive -Continue Coreg 6.25 mg twice daily. -Continue holding home ARB.   Bilateral feet pain: has history of gout but uric acid normal.  History of neuropathy.  Pain resolved. -Continue with  voltaren gel, colchicine and gabapentin.    History of CAD: No chest pain or anginal symptoms. -Continue home Coreg and aspirin  Hyperlipidemia: Has history of statin intolerance. -Could benefit from PCSK9 inhibitors  Hyponatremia: Improved. -Continue monitoring  Microcytic anemia: H&H relatively stable.  Anemia panel basically normal. Recent Labs    03/14/21 0319 03/15/21 0337 03/16/21 0416 03/17/21 0313 03/18/21 0327 03/19/21 0414 03/20/21 0338 03/21/21 0317 03/22/21 0346 03/23/21 0314  HGB 11.5* 11.2* 11.6* 11.5* 10.5* 10.9* 10.5* 10.9* 10.9* 10.4*  -Continue monitoring  Leukocytosis/bandemia: Likely due to #1.  Resolved. -Antibiotics as above  Physical deconditioning -Continue PT/OT -Final disposition TBD.  Appealing for CIR.  Moderate malnutrition: As evidenced by significant muscle mass and subcu fat loss in lower extremities Body mass index is 30.83  kg/m. Nutrition Problem: Moderate Malnutrition Etiology: acute illness (Bowel obstruction) Signs/Symptoms: mild fat depletion, mild muscle depletion, energy intake < or equal to 50% for > or equal to 5 days Interventions: Ensure Enlive (each supplement provides 350kcal and 20 grams of protein), MVI   DVT prophylaxis:  heparin injection 5,000 Units Start: 03/12/21 1645 Place and maintain sequential compression device Start: 03/10/21 1036  Code Status: Full code Family Communication: None at bedside. Level of care: Med-Surg Status is: Inpatient  Remains inpatient appropriate because: Intra-abdominal abscess requiring IV antibiotics and safe disposition/CIR     Consultants:  General surgery Infectious disease Interventional radiology   Sch Meds:  Scheduled Meds:  carvedilol  6.25 mg Oral BID AC   Chlorhexidine Gluconate Cloth  6 each Topical Daily   colchicine  0.6 mg Oral Daily   diclofenac Sodium  2 g Topical QID   feeding supplement  237 mL Oral TID BM   gabapentin  100 mg Oral BID   heparin injection (subcutaneous)  5,000 Units Subcutaneous Q8H   insulin aspart  0-20 Units Subcutaneous TID WC   insulin aspart  0-5 Units Subcutaneous QHS   insulin glargine-yfgn  15 Units Subcutaneous Daily   lidocaine  1 patch Transdermal Q24H   melatonin  6 mg Oral  QHS   metroNIDAZOLE  500 mg Oral Q12H   multivitamin with minerals  1 tablet Oral Daily   pantoprazole  40 mg Oral BID   polyethylene glycol  17 g Oral BID   sodium chloride flush  10-40 mL Intracatheter Q12H   Continuous Infusions:  cefTRIAXone (ROCEPHIN)  IV 2 g (03/22/21 2104)   methocarbamol (ROBAXIN) IV     PRN Meds:.acetaminophen, hydrALAZINE, HYDROmorphone (DILAUDID) injection, lip balm, methocarbamol (ROBAXIN) IV, nitroGLYCERIN, ondansetron (ZOFRAN) IV, oxyCODONE, phenol, sodium chloride flush  Antimicrobials: Anti-infectives (From admission, onward)    Start     Dose/Rate Route Frequency Ordered Stop    03/18/21 2200  cefTRIAXone (ROCEPHIN) 2 g in sodium chloride 0.9 % 100 mL IVPB        2 g 200 mL/hr over 30 Minutes Intravenous Every 24 hours 03/18/21 1514     03/18/21 2200  metroNIDAZOLE (FLAGYL) tablet 500 mg        500 mg Oral Every 12 hours 03/18/21 1514     03/11/21 0900  piperacillin-tazobactam (ZOSYN) IVPB 3.375 g  Status:  Discontinued        3.375 g 12.5 mL/hr over 240 Minutes Intravenous Every 8 hours 03/11/21 0815 03/18/21 1514   03/05/21 0845  cefoTEtan (CEFOTAN) 2 g in sodium chloride 0.9 % 100 mL IVPB        2 g 200 mL/hr over 30 Minutes Intravenous To [facility] 03/04/21 2206 03/05/21 1035        I have personally reviewed the following labs and images: CBC: Recent Labs  Lab 03/19/21 0414 03/20/21 0338 03/21/21 0317 03/22/21 0346 03/23/21 0314  WBC 9.0 7.6 7.3 6.5 6.0  NEUTROABS 5.2 4.9 3.5 3.0 3.8  HGB 10.9* 10.5* 10.9* 10.9* 10.4*  HCT 34.4* 32.9* 35.1* 35.5* 33.6*  MCV 69.9* 69.0* 70.5* 70.7* 70.1*  PLT 454* 429* 474* 478* 440*   BMP &GFR Recent Labs  Lab 03/17/21 0313 03/18/21 0327 03/19/21 0414  NA 130* 131* 132*  K 4.6 4.9 4.6  CL 97* 102 103  CO2 24 24 23   GLUCOSE 199* 202* 170*  BUN 19 31* 23  CREATININE 0.88 1.25* 0.86  CALCIUM 9.3 9.1 9.0  MG  --  2.1  --   PHOS  --  4.6 3.7   Estimated Creatinine Clearance: 92.5 mL/min (by C-G formula based on SCr of 0.86 mg/dL). Liver & Pancreas: Recent Labs  Lab 03/18/21 0327 03/19/21 0414  AST 37  --   ALT 33  --   ALKPHOS 84  --   BILITOT 0.6  --   PROT 7.8  --   ALBUMIN 2.1* 2.1*   No results for input(s): LIPASE, AMYLASE in the last 168 hours. No results for input(s): AMMONIA in the last 168 hours. Diabetic: No results for input(s): HGBA1C in the last 72 hours. Recent Labs  Lab 03/22/21 1158 03/22/21 1643 03/22/21 2035 03/23/21 0757 03/23/21 1207  GLUCAP 133* 141* 103* 128* 183*   Cardiac Enzymes: No results for input(s): CKTOTAL, CKMB, CKMBINDEX, TROPONINI in the  last 168 hours. No results for input(s): PROBNP in the last 8760 hours. Coagulation Profile: No results for input(s): INR, PROTIME in the last 168 hours. Thyroid Function Tests: No results for input(s): TSH, T4TOTAL, FREET4, T3FREE, THYROIDAB in the last 72 hours. Lipid Profile: No results for input(s): CHOL, HDL, LDLCALC, TRIG, CHOLHDL, LDLDIRECT in the last 72 hours.  Anemia Panel: No results for input(s): VITAMINB12, FOLATE, FERRITIN, TIBC, IRON, RETICCTPCT in the last 72  hours.  Urine analysis:    Component Value Date/Time   COLORURINE YELLOW 03/04/2021 0300   APPEARANCEUR CLEAR 03/04/2021 0300   APPEARANCEUR Clear 01/12/2016 1634   LABSPEC 1.020 03/04/2021 0300   PHURINE 5.0 03/04/2021 0300   GLUCOSEU >=500 (A) 03/04/2021 0300   HGBUR NEGATIVE 03/04/2021 0300   BILIRUBINUR NEGATIVE 03/04/2021 0300   BILIRUBINUR negative 12/06/2018 1659   BILIRUBINUR Negative 01/12/2016 1634   KETONESUR 20 (A) 03/04/2021 0300   PROTEINUR NEGATIVE 03/04/2021 0300   UROBILINOGEN 2.0 (A) 12/06/2018 1659   UROBILINOGEN 1 10/23/2013 1555   NITRITE NEGATIVE 03/04/2021 0300   LEUKOCYTESUR NEGATIVE 03/04/2021 0300   Sepsis Labs: Invalid input(s): PROCALCITONIN, [place]  Microbiology: No results found for this or any previous visit (from the past 240 hour(s)).  Radiology Studies: No results found.     Corrado Hymon T. [place]  If 7PM-7AM, please contact night-coverage [email or web address] 03/23/2021, 2:40 PM

## 2021-03-23 NOTE — Progress Notes (Signed)
Progress Note  18 Days Post-Op  Subjective: Several bowel movements yesterday and good appetite with soft diet. Pain well controlled. No emesis  Objective: Vital signs in last 24 hours: Temp:  [97.7 F (36.5 C)-98.5 F (36.9 C)] 97.7 F (36.5 C) (11/15 0756) Pulse Rate:  [73-87] 85 (11/15 0756) Resp:  [17-18] 17 (11/15 0535) BP: (91-111)/(63-72) 108/72 (11/15 0756) SpO2:  [93 %-100 %] 100 % (11/15 0756) Last BM Date: 03/22/21  Intake/Output from previous day: 11/14 0701 - 11/15 0700 In: 240 [P.O.:240] Out: 400 [Urine:400] Intake/Output this shift: No intake/output data recorded.  PE: General: pleasant, WD, male who is laying in bed in NAD HEENT: head is normocephalic, atraumatic.  Mouth is pink and moist Heart: regular, rate, and rhythm.   Palpable radial and pedal pulses bilaterally Lungs: CTAB. Respiratory effort nonlabored Abd: soft, ND, +BS, very mild TTP without rebound or gaurding over midline incision. Incision with distal portion skin open (2x2cm) with minimal bloody discharge. Cavity tracks 5 cm deep at cranial apex of wound and 7 cm cranially under epidermis along incision line MSK: all 4 extremities are symmetrical with no cyanosis, clubbing, or edema. Skin: warm and dry with no masses, lesions, or rashes Psych: A&Ox3 with an appropriate affect.    Lab Results:  Recent Labs    03/22/21 0346 03/23/21 0314  WBC 6.5 6.0  HGB 10.9* 10.4*  HCT 35.5* 33.6*  PLT 478* 440*    BMET No results for input(s): NA, K, CL, CO2, GLUCOSE, BUN, CREATININE, CALCIUM in the last 72 hours. PT/INR No results for input(s): LABPROT, INR in the last 72 hours. CMP     Component Value Date/Time   NA 132 (L) 03/19/2021 0414   NA 137 02/10/2021 1538   K 4.6 03/19/2021 0414   CL 103 03/19/2021 0414   CO2 23 03/19/2021 0414   GLUCOSE 170 (H) 03/19/2021 0414   BUN 23 03/19/2021 0414   BUN 13 02/10/2021 1538   CREATININE 0.86 03/19/2021 0414   CREATININE 1.22 06/30/2014  1634   CALCIUM 9.0 03/19/2021 0414   PROT 7.8 03/18/2021 0327   PROT 7.2 12/06/2018 1648   ALBUMIN 2.1 (L) 03/19/2021 0414   ALBUMIN 4.2 12/06/2018 1648   AST 37 03/18/2021 0327   ALT 33 03/18/2021 0327   ALKPHOS 84 03/18/2021 0327   BILITOT 0.6 03/18/2021 0327   BILITOT 0.3 12/06/2018 1648   GFRNONAA >60 03/19/2021 0414   GFRNONAA 78 10/23/2013 1554   GFRAA 86 06/09/2020 1508   GFRAA >89 10/23/2013 1554   Lipase  No results found for: LIPASE     Studies/Results: DG Abd 1 View  Result Date: 03/21/2021 CLINICAL DATA:  Abdominal pain, constipation EXAM: ABDOMEN - 1 VIEW COMPARISON:  CT 03/19/2021 FINDINGS: Nonobstructive bowel gas pattern. There is a moderate to large colonic stool burden. No radiopaque calculi over the kidneys or course of the ureters. No acute osseous abnormality. Multilevel degenerative changes of the spine. Bilateral hip degenerative changes, right worse than left. IMPRESSION: No evidence of bowel obstruction. Moderate to large colonic stool burden. Electronically Signed   By: Caprice Renshaw M.D.   On: 03/21/2021 12:08    Anti-infectives: Anti-infectives (From admission, onward)    Start     Dose/Rate Route Frequency Ordered Stop   03/18/21 2200  cefTRIAXone (ROCEPHIN) 2 g in sodium chloride 0.9 % 100 mL IVPB        2 g 200 mL/hr over 30 Minutes Intravenous Every 24 hours 03/18/21 1514  03/18/21 2200  metroNIDAZOLE (FLAGYL) tablet 500 mg        500 mg Oral Every 12 hours 03/18/21 1514     03/11/21 0900  piperacillin-tazobactam (ZOSYN) IVPB 3.375 g  Status:  Discontinued        3.375 g 12.5 mL/hr over 240 Minutes Intravenous Every 8 hours 03/11/21 0815 03/18/21 1514   03/05/21 0845  cefoTEtan (CEFOTAN) 2 g in sodium chloride 0.9 % 100 mL IVPB        2 g 200 mL/hr over 30 Minutes Intravenous To [facility] 03/04/21 2206 03/05/21 1035        Assessment/Plan  SBO with no previous abdominal surgical history  POD#18 S/P diagnostic laparoscopy,  lysis of single adhesive band in the left lower quadrant with resolution of bowel obstruction, mini laparotomy with primary repair of 2 mm small bowel perforation on 03/05/21 Dr. Fredricka Bonine - CT scan 11/11  improved 2 subcutaneous collections noted - Fluid collection on CT small and not amenable to drainage.  - tolerating soft diet. TPN stopped - Mobilize. Working with PT/OT who are currently recommending CIR. - staples out 11/13 - small seroma noted. Wound cavity probed and packed. Continue bid wet to dry dressing changes - discussed with nursing and will try to get plain packing strips for optimal packing vs small width kerlix. If wound cannot be adequately packed may need to consider extending wound opening at bedside.   ID - cefotetan periop 10/28, zosyn 11/3>11/10. Rocephin/flagyl 11/10>> VTE - SCDs, sq heparin FEN - IVF, soft, ensure tid, TPN off  Foley - out    DM HTN CAD h/o MI several years ago - followed by Dr. Eldridge Dace PAD HLD Chronic neck pain   LOS: 19 days    Eric Form, [facility] 03/23/2021, 9:15 AM Please see Amion for pager number during day hours 7:00am-4:30pm

## 2021-03-23 NOTE — Progress Notes (Signed)
Nutrition Follow-up  DOCUMENTATION CODES:   Non-severe (moderate) malnutrition in context of acute illness/injury  INTERVENTION:   Continue Ensure Enlive po BID, each supplement provides 350 kcal and 20 grams of protein  Multivitamin w/ minerals daily  Encourage good PO intake  PLDN ordered lunch for pt  NUTRITION DIAGNOSIS:   Moderate Malnutrition related to acute illness (Bowel obstruction) as evidenced by mild fat depletion, mild muscle depletion, energy intake < or equal to 50% for > or equal to 5 days. - Ongoing  GOAL:   Patient will meet greater than or equal to 90% of their needs - Progressing  MONITOR:   PO intake, Supplement acceptance, Skin, Labs  REASON FOR ASSESSMENT:   Consult New TPN/TNA  ASSESSMENT:   65 yo male admitted with SBO. PMH includes HTN, HLD, DM, CAD  10/28 Diag Lap, LOA, mini lap with primary repair of 2 mm SB perforation 11/02 CT scan with possible ileus or partial SBP, small pelvic fluid collection 11/03 TPN initiation 11/06 diet advanced to Clear Liquids; NGT removed 11/08 diet advanced to Full Liquids 11/09 diet advanced to SOFT 11/12 TPN discontinued  Pt reports that his appetite is "so-so", that he often feels full very fast. Pt also reports that he has experienced some nausea but no emesis. Pt reports that he is sometimes drinking 2 Ensures/day, but is not always consistent. Discussed that it is important that he is eating then drinking the supplements between meals.  Per EMR, pt intake includes: 11/10: Lunch 75% 11/11: Lunch 75%, Dinner 75% 11/12: Breakfast 45%, Dinner 50% 11/13: Breakfast 75% 11/14: Lunch 20%, Dinner 85%  PLDN discussed the menu and that he may order things off the menu that he wants, although there are some limitations and they kitchen staff will be able to help him with that. Pt stated that he understood. Pt asked PLDN to order his lunch for him today. Pt asked PLDN to assist him in opening Ensure at bedside.    Pt with no other concerns or questions at this time.   Medications reviewed and include: SSI 0-20 units q4h + 0-5 units daily, Semglee 15 units daily, Metronidazole, Protonix, Miralax, IV antibiotics Labs reviewed: 24 hr BG trends: 103-141, Iron 29 (11/10)  Admission Weight: 94.8 kg Current Weight: 89.3 kg  Diet Order:   Diet Order             DIET SOFT Room service appropriate? Yes; Fluid consistency: Thin  Diet effective now                   EDUCATION NEEDS:   No education needs have been identified at this time  Skin:  Skin Assessment: Skin Integrity Issues: Skin Integrity Issues:: Incisions Incisions: Abdomen  Last BM:  03/22/2021  Height:   Ht Readings from Last 1 Encounters:  03/11/21 5\' 7"  (1.702 m)    Weight:   Wt Readings from Last 1 Encounters:  03/22/21 89.3 kg    Ideal Body Weight:  67.3 kg  BMI:  Body mass index is 30.83 kg/m.  Estimated Nutritional Needs:   Kcal:  2100-2300 kcals  Protein:  120-135 g  Fluid:  >/= 2 L    Daniel Perkins BS, PLDN Clinical Dietitian See AMiON for contact information.

## 2021-03-23 NOTE — Progress Notes (Signed)
Mobility Specialist Criteria Algorithm Info.  Mobility Team: HOB elevated: Activity: Ambulated in hall (In chair beofre and after ambulation) Range of motion: Active; All extremities Level of assistance: Standby assist, set-up cues, supervision of patient - no hands on Assistive device: Front wheel walker Distance ambulated (ft): 460 ft Mobility response: Tolerated well Bed Position: Chair  Patient received in recliner chair eager to participate in mobility. Ambulated in hallway with supervision. Required seated rest break x1. Returned to room without incident and sat in recliner, was left with all needs met and call bell in reach.  03/23/2021 4:58 PM

## 2021-03-24 ENCOUNTER — Other Ambulatory Visit: Payer: Self-pay | Admitting: Internal Medicine

## 2021-03-24 DIAGNOSIS — E785 Hyperlipidemia, unspecified: Secondary | ICD-10-CM | POA: Diagnosis not present

## 2021-03-24 DIAGNOSIS — E440 Moderate protein-calorie malnutrition: Secondary | ICD-10-CM | POA: Diagnosis not present

## 2021-03-24 DIAGNOSIS — M1A9XX0 Chronic gout, unspecified, without tophus (tophi): Secondary | ICD-10-CM | POA: Diagnosis not present

## 2021-03-24 DIAGNOSIS — K56609 Unspecified intestinal obstruction, unspecified as to partial versus complete obstruction: Secondary | ICD-10-CM | POA: Diagnosis not present

## 2021-03-24 DIAGNOSIS — E119 Type 2 diabetes mellitus without complications: Secondary | ICD-10-CM

## 2021-03-24 LAB — GLUCOSE, CAPILLARY
Glucose-Capillary: 141 mg/dL — ABNORMAL HIGH (ref 70–99)
Glucose-Capillary: 137 mg/dL — ABNORMAL HIGH (ref 70–99)
Glucose-Capillary: 123 mg/dL — ABNORMAL HIGH (ref 70–99)
Glucose-Capillary: 106 mg/dL — ABNORMAL HIGH (ref 70–99)
Glucose-Capillary: 125 mg/dL — ABNORMAL HIGH (ref 70–99)

## 2021-03-24 NOTE — Progress Notes (Signed)
Inpatient Rehab Admissions Coordinator:   I will not have a bed for this patient on CIR today.  Spoke to [employer], PA-C, regarding wound and it appears that it is stable and able to be dressed effectively for now.  Will discuss with patient at bedside possibility of himself and/or his daughter completing dressing changes at home.    Estill Dooms, PT, DPT Admissions Coordinator [phone] 03/24/21  9:56 AM

## 2021-03-24 NOTE — Progress Notes (Signed)
PROGRESS NOTE  Daniel Perkins ZOX:[id number] DOB: 09/14/1955   PCP: Ellyn HackShah, Syed Asad A, MD  Patient is from: Home  DOA: 03/04/2021 LOS: 20  Chief complaints:  Chief Complaint  Patient presents with   Abdominal Pain     Brief Narrative / Interim history: 65 year old M with PMH of CAD, PAD, DM-2, HTN and HLD presenting with abdominal pain and found to have SBO for which she underwent ex lap with small bowel perforation repair on 03/05/2021.  Postop course complicated by persistent ileus requiring NG tube followed by TPN.  CT abdomen and pelvis on 03/10/2021 with nonspecific thickened and dilated small bowel, possible ileus/partial SBO and small pelvic fluid collection that was not amenable to drainage.  Patient was started on IV Zosyn on 11/3. ID consulted on 11/10 for guidance on his antibiotics.  Repeat CT abdomen and pelvis on 11/11 showed interval decrease in size of pelvic fluid collection but new 3.3 cm loculated fluid in subcutaneous plane in the anterior abdominal wall superior to umbilicus and posterior to skin staples.  Antibiotics de-escalated to IV CTX and Flagyl.  ID recommended CTX and Flagyl for 2 weeks from 11/11, repeat CT abdomen and pelvis in 2 weeks and reconsult based on CT result.  TPN off on 11/12.   Patient had some drainage from distal end of surgical site on 11/12.  Surgery doing WTD dressing twice daily.  Medically stable for transfer to CIR once bed available.  Subjective: Seen and examined earlier this morning.  No major events overnight of this morning.  Has not had bowel movement last night but passing a lot of gas.  Denies abdominal pain.  He says he had dressing change this morning.  He denies chest pain or dyspnea.  Objective: Vitals:   03/23/21 1635 03/23/21 2051 03/24/21 0433 03/24/21 0808  BP: 132/84 112/64 108/69 112/82  Pulse: 72 86 72 81  Resp: 20 17 18 16   Temp: 98.1 F (36.7 C) 98.4 F (36.9 C) 97.8 F (36.6 C) 98.3 F (36.8 C)  TempSrc:   Oral Oral Oral  SpO2: 99% 94% 95% 99%  Weight:      Height:        Intake/Output Summary (Last 24 hours) at 03/24/2021 1138 Last data filed at 03/24/2021 0507 Gross per 24 hour  Intake 240 ml  Output 1330 ml  Net -1090 ml   Filed Weights   03/11/21 1400 03/15/21 0411 03/22/21 0500  Weight: 88.8 kg 87.4 kg 89.3 kg    Examination:  GENERAL: No apparent distress.  Nontoxic. HEENT: MMM.  Vision and hearing grossly intact.  NECK: Supple.  No apparent JVD.  RESP:  No IWOB.  Fair aeration bilaterally. CVS:  RRR. Heart sounds normal.  ABD/GI/GU: BS+. Abd soft, NTND.  Dressing DCI. MSK/EXT:  Moves extremities.  Significant muscle mass and subcu fat loss. SKIN: no apparent skin lesion or wound.  Dressing over surgical wound DCI. NEURO: Awake and alert. Oriented appropriately.  No apparent focal neuro deficit. PSYCH: Calm. Normal affect.   Procedures:  03/05/2021-diagnostic laparoscopy, lysis of single addition band in LLQ with resolution of SBO, mini laparotomy with primary repair of 2 mm small bowel perforation by Dr. Fredricka Bonineonnor  Microbiology summarized: COVID-19 and influenza PCR nonreactive. MRSA PCR nonreactive.  Assessment & Plan: SBO due to adhesion s/p laparoscopy, lysis of adhesion and mini laparotomy for small perf repair Persistent postoperative ileus-seems to have resolved.  Possible enteritis/pelvic abscess in the setting of the above -CT A/P on 11/2 with  small bowel wall thickening and mesenteric edema with dilated bowel proximally raising concern for possible enteritis, inflammatory process or ischemic changes, and new enhancing small fluid collection in pelvis measuring 3.0 x 3.1 x 4.2 cm. -CT A/P on 11/11 howed interval decrease in size of pelvic fluid collection but new 3.3 cm loculated fluid in subcutaneous plane in the anterior abdominal wall superior to umbilicus and posterior to skin staples. -CRP elevated to 9.5.  Leukocytosis resolved. -11/12-some serosanguineous  drainage from distal end of surgical wound-packing and BID WTD dressing -Off TPN on 11/12.  Tolerating soft diet and having BMs. -ID consulted appreciate recs -Repeat CT A/P in 2 weeks, on 04/01/2021 to reassess intra-abdominal fluid collection -IV Zosyn 11/3>> CTX +Flagyl 11/11-11/25.  -Continue mobilizing patient.   Controlled NIDDM-2 with hyperglycemia: A1c 6.0% on 10/27.  On Victoza, Jardiance and glipizide alone. Recent Labs  Lab 03/23/21 1207 03/23/21 1634 03/23/21 2051 03/24/21 0842 03/24/21 1123  GLUCAP 183* 125* 155* 141* 137*  -Continue holding home meds -Continue SSI-resistance -Continue basal insulin at 15 units daily -History of statin intolerance.  AKI/azotemia: Resolved. Recent Labs    03/10/21 0149 03/11/21 0045 03/12/21 0340 03/13/21 0412 03/14/21 0800 03/15/21 0337 03/16/21 0802 03/17/21 0313 03/18/21 0327 03/19/21 0414  BUN 15 15 13 13 16 17 17 19  31* 23  CREATININE 0.97 1.01 0.98 0.91 0.91 0.90 0.92 0.88 1.25* 0.86  -Monitor intermittently  Essential hypertension: Normotensive -Continue Coreg 6.25 mg twice daily. -Continue holding home ARB.   Bilateral feet pain: has history of gout but uric acid normal.  History of neuropathy.  Pain resolved. -Continue with  voltaren gel, colchicine and gabapentin.    History of CAD: No chest pain or anginal symptoms. -Continue home Coreg and aspirin  Hyperlipidemia: Has history of statin intolerance. -Could benefit from PCSK9 inhibitors  Hyponatremia: Improved. -Continue monitoring  Microcytic anemia: H&H relatively stable.  Anemia panel basically normal. Recent Labs    03/14/21 0319 03/15/21 0337 03/16/21 0416 03/17/21 0313 03/18/21 0327 03/19/21 0414 03/20/21 0338 03/21/21 0317 03/22/21 0346 03/23/21 0314  HGB 11.5* 11.2* 11.6* 11.5* 10.5* 10.9* 10.5* 10.9* 10.9* 10.4*  -Continue monitoring  Leukocytosis/bandemia: Likely due to #1.  Resolved. -Antibiotics as above  Physical  deconditioning -Plan for transfer to CIR for therapy once bed available.  Paperwork: completed daughter's FMLA paper with patient's permission about medical disclosure.   Moderate malnutrition: As evidenced by significant muscle mass and subcu fat loss in lower extremities Body mass index is 30.83 kg/m. Nutrition Problem: Moderate Malnutrition Etiology: acute illness (Bowel obstruction) Signs/Symptoms: mild fat depletion, mild muscle depletion, energy intake < or equal to 50% for > or equal to 5 days Interventions: Ensure Enlive (each supplement provides 350kcal and 20 grams of protein), MVI   DVT prophylaxis:  heparin injection 5,000 Units Start: 03/12/21 1645 Place and maintain sequential compression device Start: 03/10/21 1036  Code Status: Full code Family Communication: None at bedside. Level of care: Med-Surg Status is: Inpatient  Remains inpatient appropriate because: Patient is medically stable for transfer to CIR once bed available.     Consultants:  General surgery Infectious disease Interventional radiology   Sch Meds:  Scheduled Meds:  carvedilol  6.25 mg Oral BID AC   Chlorhexidine Gluconate Cloth  6 each Topical Daily   colchicine  0.6 mg Oral Daily   diclofenac Sodium  2 g Topical QID   feeding supplement  237 mL Oral TID BM   gabapentin  100 mg Oral BID   heparin  injection (subcutaneous)  5,000 Units Subcutaneous Q8H   insulin aspart  0-20 Units Subcutaneous TID WC   insulin aspart  0-5 Units Subcutaneous QHS   insulin glargine-yfgn  15 Units Subcutaneous Daily   lidocaine  1 patch Transdermal Q24H   melatonin  6 mg Oral QHS   metroNIDAZOLE  500 mg Oral Q12H   multivitamin with minerals  1 tablet Oral Daily   pantoprazole  40 mg Oral BID   polyethylene glycol  17 g Oral BID   sodium chloride flush  10-40 mL Intracatheter Q12H   Continuous Infusions:  cefTRIAXone (ROCEPHIN)  IV 2 g (03/23/21 2016)   methocarbamol (ROBAXIN) IV     PRN  Meds:.acetaminophen, hydrALAZINE, HYDROmorphone (DILAUDID) injection, lip balm, methocarbamol (ROBAXIN) IV, nitroGLYCERIN, ondansetron (ZOFRAN) IV, oxyCODONE, phenol, sodium chloride flush  Antimicrobials: Anti-infectives (From admission, onward)    Start     Dose/Rate Route Frequency Ordered Stop   03/18/21 2200  cefTRIAXone (ROCEPHIN) 2 g in sodium chloride 0.9 % 100 mL IVPB        2 g 200 mL/hr over 30 Minutes Intravenous Every 24 hours 03/18/21 1514     03/18/21 2200  metroNIDAZOLE (FLAGYL) tablet 500 mg        500 mg Oral Every 12 hours 03/18/21 1514     03/11/21 0900  piperacillin-tazobactam (ZOSYN) IVPB 3.375 g  Status:  Discontinued        3.375 g 12.5 mL/hr over 240 Minutes Intravenous Every 8 hours 03/11/21 0815 03/18/21 1514   03/05/21 0845  cefoTEtan (CEFOTAN) 2 g in sodium chloride 0.9 % 100 mL IVPB        2 g 200 mL/hr over 30 Minutes Intravenous To [facility] 03/04/21 2206 03/05/21 1035        I have personally reviewed the following labs and images: CBC: Recent Labs  Lab 03/19/21 0414 03/20/21 0338 03/21/21 0317 03/22/21 0346 03/23/21 0314  WBC 9.0 7.6 7.3 6.5 6.0  NEUTROABS 5.2 4.9 3.5 3.0 3.8  HGB 10.9* 10.5* 10.9* 10.9* 10.4*  HCT 34.4* 32.9* 35.1* 35.5* 33.6*  MCV 69.9* 69.0* 70.5* 70.7* 70.1*  PLT 454* 429* 474* 478* 440*   BMP &GFR Recent Labs  Lab 03/18/21 0327 03/19/21 0414  NA 131* 132*  K 4.9 4.6  CL 102 103  CO2 24 23  GLUCOSE 202* 170*  BUN 31* 23  CREATININE 1.25* 0.86  CALCIUM 9.1 9.0  MG 2.1  --   PHOS 4.6 3.7   Estimated Creatinine Clearance: 92.5 mL/min (by C-G formula based on SCr of 0.86 mg/dL). Liver & Pancreas: Recent Labs  Lab 03/18/21 0327 03/19/21 0414  AST 37  --   ALT 33  --   ALKPHOS 84  --   BILITOT 0.6  --   PROT 7.8  --   ALBUMIN 2.1* 2.1*   No results for input(s): LIPASE, AMYLASE in the last 168 hours. No results for input(s): AMMONIA in the last 168 hours. Diabetic: No results for input(s):  HGBA1C in the last 72 hours. Recent Labs  Lab 03/23/21 1207 03/23/21 1634 03/23/21 2051 03/24/21 0842 03/24/21 1123  GLUCAP 183* 125* 155* 141* 137*   Cardiac Enzymes: No results for input(s): CKTOTAL, CKMB, CKMBINDEX, TROPONINI in the last 168 hours. No results for input(s): PROBNP in the last 8760 hours. Coagulation Profile: No results for input(s): INR, PROTIME in the last 168 hours. Thyroid Function Tests: No results for input(s): TSH, T4TOTAL, FREET4, T3FREE, THYROIDAB in the last 72 hours.  Lipid Profile: No results for input(s): CHOL, HDL, LDLCALC, TRIG, CHOLHDL, LDLDIRECT in the last 72 hours.  Anemia Panel: No results for input(s): VITAMINB12, FOLATE, FERRITIN, TIBC, IRON, RETICCTPCT in the last 72 hours.  Urine analysis:    Component Value Date/Time   COLORURINE YELLOW 03/04/2021 0300   APPEARANCEUR CLEAR 03/04/2021 0300   APPEARANCEUR Clear 01/12/2016 1634   LABSPEC 1.020 03/04/2021 0300   PHURINE 5.0 03/04/2021 0300   GLUCOSEU >=500 (A) 03/04/2021 0300   HGBUR NEGATIVE 03/04/2021 0300   BILIRUBINUR NEGATIVE 03/04/2021 0300   BILIRUBINUR negative 12/06/2018 1659   BILIRUBINUR Negative 01/12/2016 1634   KETONESUR 20 (A) 03/04/2021 0300   PROTEINUR NEGATIVE 03/04/2021 0300   UROBILINOGEN 2.0 (A) 12/06/2018 1659   UROBILINOGEN 1 10/23/2013 1555   NITRITE NEGATIVE 03/04/2021 0300   LEUKOCYTESUR NEGATIVE 03/04/2021 0300   Sepsis Labs: Invalid input(s): PROCALCITONIN, [place]  Microbiology: No results found for this or any previous visit (from the past 240 hour(s)).  Radiology Studies: No results found.     Kacy Conely T. [place]  If 7PM-7AM, please contact night-coverage [email or web address] 03/24/2021, 11:38 AM

## 2021-03-24 NOTE — Progress Notes (Signed)
Physical Therapy Treatment Patient Details Name: Daniel Perkins MRN: [id number] DOB: August 15, 1955 Today's Date: 03/24/2021   History of Present Illness Pt is 64 y/o M admitted to ED for acute abdominal pain on 03/04/21, found to have small bowel obstruction. He is S/p ex lap, small bowel perforation repair on 10/28. PMH includes CAD/MI, gout, CKD, HTN, DM and ACDF.    PT Comments    Pt demos improved activity tolerance today, able to amb inc distance in halls with dec assist.  Pt does demo SOB with activity and forward flexed trunk requiring VC's to improve breathing and upright posture.  AMPAC score indicating home with HH, however, pt has limited support and has been accepted into CIR for short term placement prior to transition home.  PT to continue to follow acutely.   Recommendations for follow up therapy are one component of a multi-disciplinary discharge planning process, led by the attending physician.  Recommendations may be updated based on patient status, additional functional criteria and insurance authorization.  Follow Up Recommendations  Acute inpatient rehab (3hours/day)     Assistance Recommended at Discharge Intermittent Supervision/Assistance  Equipment Recommendations  Rolling walker (2 wheels)    Recommendations for Other Services       Precautions / Restrictions Precautions Precautions: Other (comment) Precaution Comments: abdominal precautions     Mobility  Bed Mobility Overal bed mobility: Modified Independent Bed Mobility: Sidelying to Sit   Sidelying to sit: Modified independent (Device/Increase time)       General bed mobility comments: Transfers from supine > SL > sit with use of bedrail and mod I. Patient Response: Cooperative  Transfers   Equipment used: Agricultural consultant (2 wheels)   Sit to Stand: Modified independent (Device/Increase time)           General transfer comment: Performs sit > stand x 3 throughout tx session with mod I;  demos safety with hand placement during transition.  VCs to reach back during stand > sit for eccentric control.  Pt. utilizes rocking technique to build momentum when standing from low surfaces. Able to don shoes independently prior to transfer.    Ambulation/Gait Ambulation/Gait assistance: Min guard Gait Distance (Feet): 450 Feet Assistive device: Rolling walker (2 wheels) Gait Pattern/deviations: Decreased step length - right;Decreased step length - left;Trunk flexed       General Gait Details: Amb in halls with CGA for safety.  Demos knee buckling x 1 on L LE but able to maintain balance.  Pt. demos SOB with activity and is encouraged to slow breathing and practice PLB to assist.  Takes 1 seated rest break at 225 ft.  Posture is improved today, but pt requires intermittent reminders to look up instead of down at floor.   Stairs             Wheelchair Mobility    Modified Rankin (Stroke Patients Only)       Balance Overall balance assessment: Mild deficits observed, not formally tested           Standing balance-Leahy Scale: Fair                              Cognition Arousal/Alertness: Awake/alert Behavior During Therapy: WFL for tasks assessed/performed Overall Cognitive Status: Within Functional Limits for tasks assessed  General Comments: Supine in bed when PT arrives.  Agreeable to work with PT.  Wants to get up to use urinal and amb in halls.        Exercises      General Comments        Pertinent Vitals/Pain Pain Assessment: 0-10 Pain Score: 2  Pain Location: Abdomen Pain Descriptors / Indicators: Aching;Discomfort Pain Intervention(s): Limited activity within patient's tolerance;Monitored during session;Repositioned    Home Living                          Prior Function            PT Goals (current goals can now be found in the care plan section) Progress towards PT  goals: Progressing toward goals    Frequency           PT Plan Current plan remains appropriate    Co-evaluation              AM-PAC PT "6 Clicks" Mobility   Outcome Measure  Help needed turning from your back to your side while in a flat bed without using bedrails?: A Little Help needed moving from lying on your back to sitting on the side of a flat bed without using bedrails?: A Little Help needed moving to and from a bed to a chair (including a wheelchair)?: A Little Help needed standing up from a chair using your arms (e.g., wheelchair or bedside chair)?: A Little Help needed to walk in hospital room?: A Little Help needed climbing 3-5 steps with a railing? : A Little 6 Click Score: 18    End of Session Equipment Utilized During Treatment: Gait belt Activity Tolerance: Patient tolerated treatment well;No increased pain Patient left: in chair;with chair alarm set;with call bell/phone within reach         Time: 1029-1050 PT Time Calculation (min) (ACUTE ONLY): 21 min  Charges:  $Gait Training: 8-22 mins                     Marguita Venning A. Lynnzie Blackson, PT, DPT Acute Rehabilitation Services Office: [phone]    Enora Trillo A Bracken Moffa 03/24/2021, 10:58 AM

## 2021-03-24 NOTE — Progress Notes (Signed)
Progress Note  19 Days Post-Op  Subjective: Tolerating dressing changes well. Good appetite with soft diet. No nausea or emesis  Objective: Vital signs in last 24 hours: Temp:  [97.8 F (36.6 C)-98.4 F (36.9 C)] 98.3 F (36.8 C) (11/16 0808) Pulse Rate:  [70-86] 81 (11/16 0808) Resp:  [16-20] 16 (11/16 0808) BP: (108-137)/(64-85) 112/82 (11/16 0808) SpO2:  [94 %-100 %] 99 % (11/16 0808) Last BM Date: 03/22/21  Intake/Output from previous day: 11/15 0701 - 11/16 0700 In: 480 [P.O.:480] Out: 1530 [Urine:1530] Intake/Output this shift: No intake/output data recorded.  PE: General: pleasant, WD, male who is laying in bed in NAD HEENT: head is normocephalic, atraumatic.  Mouth is pink and moist Heart: regular, rate, and rhythm.   Palpable radial and pedal pulses bilaterally Lungs: CTAB. Respiratory effort nonlabored Abd: soft, ND, +BS, very mild TTP without rebound or gaurding over midline incision. Incision with distal portion skin open (2x2cm) with beefy red granulation tissue. Cavity tracks deeply at cranial apex of wound and up under epidermis along incision line MSK: all 4 extremities are symmetrical with no cyanosis, clubbing, or edema. Skin: warm and dry with no masses, lesions, or rashes Psych: A&Ox3 with an appropriate affect.    Lab Results:  Recent Labs    03/22/21 0346 03/23/21 0314  WBC 6.5 6.0  HGB 10.9* 10.4*  HCT 35.5* 33.6*  PLT 478* 440*    BMET No results for input(s): NA, K, CL, CO2, GLUCOSE, BUN, CREATININE, CALCIUM in the last 72 hours. PT/INR No results for input(s): LABPROT, INR in the last 72 hours. CMP     Component Value Date/Time   NA 132 (L) 03/19/2021 0414   NA 137 02/10/2021 1538   K 4.6 03/19/2021 0414   CL 103 03/19/2021 0414   CO2 23 03/19/2021 0414   GLUCOSE 170 (H) 03/19/2021 0414   BUN 23 03/19/2021 0414   BUN 13 02/10/2021 1538   CREATININE 0.86 03/19/2021 0414   CREATININE 1.22 06/30/2014 1634   CALCIUM 9.0  03/19/2021 0414   PROT 7.8 03/18/2021 0327   PROT 7.2 12/06/2018 1648   ALBUMIN 2.1 (L) 03/19/2021 0414   ALBUMIN 4.2 12/06/2018 1648   AST 37 03/18/2021 0327   ALT 33 03/18/2021 0327   ALKPHOS 84 03/18/2021 0327   BILITOT 0.6 03/18/2021 0327   BILITOT 0.3 12/06/2018 1648   GFRNONAA >60 03/19/2021 0414   GFRNONAA 78 10/23/2013 1554   GFRAA 86 06/09/2020 1508   GFRAA >89 10/23/2013 1554   Lipase  No results found for: LIPASE     Studies/Results: No results found.  Anti-infectives: Anti-infectives (From admission, onward)    Start     Dose/Rate Route Frequency Ordered Stop   03/18/21 2200  cefTRIAXone (ROCEPHIN) 2 g in sodium chloride 0.9 % 100 mL IVPB        2 g 200 mL/hr over 30 Minutes Intravenous Every 24 hours 03/18/21 1514     03/18/21 2200  metroNIDAZOLE (FLAGYL) tablet 500 mg        500 mg Oral Every 12 hours 03/18/21 1514     03/11/21 0900  piperacillin-tazobactam (ZOSYN) IVPB 3.375 g  Status:  Discontinued        3.375 g 12.5 mL/hr over 240 Minutes Intravenous Every 8 hours 03/11/21 0815 03/18/21 1514   03/05/21 0845  cefoTEtan (CEFOTAN) 2 g in sodium chloride 0.9 % 100 mL IVPB        2 g 200 mL/hr over 30 Minutes Intravenous  To [facility] 03/04/21 2206 03/05/21 1035        Assessment/Plan  SBO with no previous abdominal surgical history  POD#19 S/P diagnostic laparoscopy, lysis of single adhesive band in the left lower quadrant with resolution of bowel obstruction, mini laparotomy with primary repair of 2 mm small bowel perforation on 03/05/21 Dr. Fredricka Bonine - CT scan 11/11  improved 2 subcutaneous collections noted - Fluid collection on CT small and not amenable to drainage.  - tolerating soft diet. TPN stopped - Mobilize. Working with PT/OT who are currently recommending CIR. - staples out 11/13 - small seroma noted. Continue bid wet to dry dressing changes - moistened plain guaze packing strips or 2 inch kerlix  Stable for discharge to CIR from  surgical perspective  ID - cefotetan periop 10/28, zosyn 11/3>11/10. Rocephin/flagyl 11/10>> VTE - SCDs, sq heparin FEN - IVF, soft, ensure tid, TPN off  Foley - out    DM HTN CAD h/o MI several years ago - followed by Dr. Eldridge Dace PAD HLD Chronic neck pain   LOS: 20 days    Eric Form, [facility] 03/24/2021, 8:25 AM Please see Amion for pager number during day hours 7:00am-4:30pm

## 2021-03-24 NOTE — Progress Notes (Signed)
Occupational Therapy Treatment Patient Details Name: Daniel Perkins MRN: [id number] DOB: 1955/09/12 Today's Date: 03/24/2021   History of present illness Pt is 65 y/o M admitted to ED for acute abdominal pain on 03/04/21, found to have small bowel obstruction. He is S/p ex lap, small bowel perforation repair on 10/28. PMH includes CAD/MI, gout, CKD, HTN, DM and ACDF.   OT comments  Pt eager to move with therapy today, min guard - supervision during transfers and ambulation of hallway distance. Pt required min verbal cuing to remain upright while walking, requires x1 rest break and less shortness of breath after walking compared to previous session. Pt reports regularly completing UE HEP, reviewed ways to adapt exercise when pt is seated in chair vs. supine in bed. Pt verbalizes understanding. Pt still limited by impaired balance, activity tolerance, strength and ROM at this time. Will continue to follow acutely, pt still remains good CIR candidate.    Recommendations for follow up therapy are one component of a multi-disciplinary discharge planning process, led by the attending physician.  Recommendations may be updated based on patient status, additional functional criteria and insurance authorization.    Follow Up Recommendations  Acute inpatient rehab (3hours/day)    Assistance Recommended at Discharge Intermittent Supervision/Assistance  Equipment Recommendations  None recommended by OT    Recommendations for Other Services PT consult    Precautions / Restrictions Precautions Precautions: Other (comment) Precaution Comments: abdominal precautions Restrictions Weight Bearing Restrictions: No Other Position/Activity Restrictions: no lifting >10lbs       Mobility Bed Mobility Overal bed mobility: Modified Independent Bed Mobility: Sidelying to Sit   Sidelying to sit: Modified independent (Device/Increase time)       General bed mobility comments: Transfers from supine >  SL > sit with use of bedrail and mod I.    Transfers Overall transfer level: Needs assistance Equipment used: Rolling walker (2 wheels)   Sit to Stand: Modified independent (Device/Increase time);Supervision           General transfer comment: Performs sit > stand x 3 throughout tx session with mod I; demos safety with hand placement during transition.  VCs to reach back during stand > sit for eccentric control.  Pt. utilizes rocking technique to build momentum when standing from low surfaces. Able to don shoes independently prior to transfer.     Balance Overall balance assessment: No apparent balance deficits (not formally assessed) Sitting-balance support: Feet supported;No upper extremity supported Sitting balance-Leahy Scale: Normal     Standing balance support: Reliant on assistive device for balance;During functional activity Standing balance-Leahy Scale: Fair                             ADL either performed or assessed with clinical judgement   ADL                           Toilet Transfer: Min guard;Ambulation;Rolling walker (2 wheels);Supervision/safety Toilet Transfer Details (indicate cue type and reason): simulated transfer to chair           General ADL Comments: pt requiring v/c to stand/walk with upright posture    Extremity/Trunk Assessment Upper Extremity Assessment Upper Extremity Assessment: Overall WFL for tasks assessed   Lower Extremity Assessment Lower Extremity Assessment: Defer to PT evaluation        Vision   Vision Assessment?: No apparent visual deficits   Perception Perception Perception: Within Functional  Limits   Praxis Praxis Praxis: Not tested    Cognition Arousal/Alertness: Awake/alert Behavior During Therapy: WFL for tasks assessed/performed Overall Cognitive Status: Within Functional Limits for tasks assessed                                 General Comments: agreeable/eager to walk  in hall          Exercises Exercises: General Upper Extremity (reveiwed exercises given from previous session)   Shoulder Instructions       General Comments pt with improved activity tolerance this session, walked long hallway distance with x1 rest break    Pertinent Vitals/ Pain       Pain Assessment: Faces Pain Score: 2  Faces Pain Scale: Hurts a little bit Pain Location: Abdomen (pt notes it feels like wound "stretches" during sit to stand transfers) Pain Descriptors / Indicators: Aching;Discomfort Pain Intervention(s): Limited activity within patient's tolerance;Monitored during session;Repositioned  Home Living                                          Prior Functioning/Environment              Frequency  Min 2X/week        Progress Toward Goals  OT Goals(current goals can now be found in the care plan section)  Progress towards OT goals: Progressing toward goals  Acute Rehab OT Goals Patient Stated Goal: go to rehab OT Goal Formulation: With patient Time For Goal Achievement: 03/23/21 Potential to Achieve Goals: Good ADL Goals Pt Will Perform Upper Body Dressing: sitting;with supervision Pt Will Perform Lower Body Dressing: sit to/from stand;with min guard assist Pt Will Transfer to Toilet: regular height toilet;with min guard assist Pt Will Perform Tub/Shower Transfer: with min assist  Plan Discharge plan remains appropriate;Frequency remains appropriate    Co-evaluation                 AM-PAC OT "6 Clicks" Daily Activity     Outcome Measure   Help from another person eating meals?: None Help from another person taking care of personal grooming?: None Help from another person toileting, which includes using toliet, bedpan, or urinal?: A Little Help from another person bathing (including washing, rinsing, drying)?: A Little Help from another person to put on and taking off regular upper body clothing?: None Help from  another person to put on and taking off regular lower body clothing?: A Little 6 Click Score: 21    End of Session Equipment Utilized During Treatment: Gait belt;Rolling walker (2 wheels)  OT Visit Diagnosis: Unsteadiness on feet (R26.81);Other abnormalities of gait and mobility (R26.89);Muscle weakness (generalized) (M62.81);Pain   Activity Tolerance Patient tolerated treatment well   Patient Left in chair;with chair alarm set;with call bell/phone within reach   Nurse Communication Mobility status        Time: 2542-7062 OT Time Calculation (min): 21 min  Charges: OT General Charges $OT Visit: 1 Visit OT Treatments $Therapeutic Activity: 8-22 mins  Alfonzo Beers, OTD, OTR/L Acute Rehab [phone]   Mayer Masker 03/24/2021, 2:08 PM

## 2021-03-24 NOTE — Progress Notes (Signed)
Mobility Specialist Criteria Algorithm Info.  Mobility Team: HOB elevated: Activity: Ambulated in hall (in chair before and after ambulation) Range of motion: Active; All extremities Level of assistance: Standby assist, set-up cues, supervision of patient - no hands on Assistive device: Front wheel walker Distance ambulated (ft): 312 ft Mobility response: Tolerated well Bed Position: Chair  Patient received in chair. Had complaints of a headache and stomach pain but was eager to ambulate in hallway this afternoon. Ambulated in hallway supervision to min guard with steady gait. As pt fatigues required cues to stand proximal to RW. Required one seated rest break. Returned to room without incident and was left in recliner with all needs met.   03/24/2021 3:37 PM

## 2021-03-25 ENCOUNTER — Encounter (HOSPITAL_COMMUNITY): Payer: Self-pay | Admitting: Physical Medicine and Rehabilitation

## 2021-03-25 ENCOUNTER — Inpatient Hospital Stay (HOSPITAL_COMMUNITY)
Admission: RE | Admit: 2021-03-25 | Discharge: 2021-04-09 | DRG: 945 | Disposition: A | Discharge status: Home Health | Payer: Medicare Other | Source: Intra-hospital | Attending: Physical Medicine and Rehabilitation | Admitting: Physical Medicine and Rehabilitation

## 2021-03-25 DIAGNOSIS — G4700 Insomnia, unspecified: Secondary | ICD-10-CM | POA: Diagnosis present

## 2021-03-25 DIAGNOSIS — Z981 Arthrodesis status: Secondary | ICD-10-CM

## 2021-03-25 DIAGNOSIS — E785 Hyperlipidemia, unspecified: Secondary | ICD-10-CM | POA: Diagnosis present

## 2021-03-25 DIAGNOSIS — Z79899 Other long term (current) drug therapy: Secondary | ICD-10-CM

## 2021-03-25 DIAGNOSIS — Y838 Other surgical procedures as the cause of abnormal reaction of the patient, or of later complication, without mention of misadventure at the time of the procedure: Secondary | ICD-10-CM | POA: Diagnosis present

## 2021-03-25 DIAGNOSIS — E875 Hyperkalemia: Secondary | ICD-10-CM | POA: Diagnosis present

## 2021-03-25 DIAGNOSIS — Z8719 Personal history of other diseases of the digestive system: Secondary | ICD-10-CM

## 2021-03-25 DIAGNOSIS — N182 Chronic kidney disease, stage 2 (mild): Secondary | ICD-10-CM | POA: Diagnosis present

## 2021-03-25 DIAGNOSIS — M109 Gout, unspecified: Secondary | ICD-10-CM | POA: Diagnosis present

## 2021-03-25 DIAGNOSIS — I129 Hypertensive chronic kidney disease with stage 1 through stage 4 chronic kidney disease, or unspecified chronic kidney disease: Secondary | ICD-10-CM | POA: Diagnosis present

## 2021-03-25 DIAGNOSIS — D649 Anemia, unspecified: Secondary | ICD-10-CM

## 2021-03-25 DIAGNOSIS — R269 Unspecified abnormalities of gait and mobility: Secondary | ICD-10-CM | POA: Diagnosis present

## 2021-03-25 DIAGNOSIS — E119 Type 2 diabetes mellitus without complications: Secondary | ICD-10-CM | POA: Diagnosis not present

## 2021-03-25 DIAGNOSIS — R5381 Other malaise: Secondary | ICD-10-CM | POA: Diagnosis present

## 2021-03-25 DIAGNOSIS — Z794 Long term (current) use of insulin: Secondary | ICD-10-CM

## 2021-03-25 DIAGNOSIS — Z8616 Personal history of COVID-19: Secondary | ICD-10-CM

## 2021-03-25 DIAGNOSIS — Z87768 Personal history of other specified (corrected) congenital malformations of integument, limbs and musculoskeletal system: Secondary | ICD-10-CM

## 2021-03-25 DIAGNOSIS — K56609 Unspecified intestinal obstruction, unspecified as to partial versus complete obstruction: Secondary | ICD-10-CM | POA: Diagnosis present

## 2021-03-25 DIAGNOSIS — Z8701 Personal history of pneumonia (recurrent): Secondary | ICD-10-CM | POA: Diagnosis not present

## 2021-03-25 DIAGNOSIS — Z48815 Encounter for surgical aftercare following surgery on the digestive system: Secondary | ICD-10-CM

## 2021-03-25 DIAGNOSIS — M542 Cervicalgia: Secondary | ICD-10-CM | POA: Diagnosis present

## 2021-03-25 DIAGNOSIS — G8929 Other chronic pain: Secondary | ICD-10-CM | POA: Diagnosis present

## 2021-03-25 DIAGNOSIS — E1122 Type 2 diabetes mellitus with diabetic chronic kidney disease: Secondary | ICD-10-CM | POA: Diagnosis present

## 2021-03-25 DIAGNOSIS — K651 Peritoneal abscess: Secondary | ICD-10-CM

## 2021-03-25 DIAGNOSIS — K91872 Postprocedural seroma of a digestive system organ or structure following a digestive system procedure: Secondary | ICD-10-CM | POA: Diagnosis present

## 2021-03-25 DIAGNOSIS — I2510 Atherosclerotic heart disease of native coronary artery without angina pectoris: Secondary | ICD-10-CM | POA: Diagnosis present

## 2021-03-25 DIAGNOSIS — E1165 Type 2 diabetes mellitus with hyperglycemia: Secondary | ICD-10-CM | POA: Diagnosis present

## 2021-03-25 DIAGNOSIS — T8131XA Disruption of external operation (surgical) wound, not elsewhere classified, initial encounter: Secondary | ICD-10-CM

## 2021-03-25 DIAGNOSIS — I10 Essential (primary) hypertension: Secondary | ICD-10-CM | POA: Diagnosis not present

## 2021-03-25 DIAGNOSIS — Z87891 Personal history of nicotine dependence: Secondary | ICD-10-CM

## 2021-03-25 DIAGNOSIS — E0865 Diabetes mellitus due to underlying condition with hyperglycemia: Secondary | ICD-10-CM | POA: Diagnosis present

## 2021-03-25 DIAGNOSIS — Z8249 Family history of ischemic heart disease and other diseases of the circulatory system: Secondary | ICD-10-CM

## 2021-03-25 DIAGNOSIS — R519 Headache, unspecified: Secondary | ICD-10-CM | POA: Diagnosis present

## 2021-03-25 DIAGNOSIS — T8131XD Disruption of external operation (surgical) wound, not elsewhere classified, subsequent encounter: Secondary | ICD-10-CM | POA: Diagnosis not present

## 2021-03-25 DIAGNOSIS — Z7985 Long-term (current) use of injectable non-insulin antidiabetic drugs: Secondary | ICD-10-CM

## 2021-03-25 DIAGNOSIS — I252 Old myocardial infarction: Secondary | ICD-10-CM

## 2021-03-25 DIAGNOSIS — Z833 Family history of diabetes mellitus: Secondary | ICD-10-CM

## 2021-03-25 DIAGNOSIS — Z7984 Long term (current) use of oral hypoglycemic drugs: Secondary | ICD-10-CM

## 2021-03-25 LAB — GLUCOSE, CAPILLARY
Glucose-Capillary: 156 mg/dL — ABNORMAL HIGH (ref 70–99)
Glucose-Capillary: 86 mg/dL (ref 70–99)
Glucose-Capillary: 152 mg/dL — ABNORMAL HIGH (ref 70–99)
Glucose-Capillary: 118 mg/dL — ABNORMAL HIGH (ref 70–99)

## 2021-03-25 MED ORDER — PHENOL 1.4 % MT LIQD
1.0000 | OROMUCOSAL | Status: DC | PRN
  Filled 2021-03-25: qty 177

## 2021-03-25 MED ORDER — METRONIDAZOLE 500 MG PO TABS: 500.0000 mg | ORAL_TABLET | Freq: Two times a day (BID) | ORAL | 0 refills | Status: DC

## 2021-03-25 MED ORDER — MELATONIN 3 MG PO TABS
6.0000 mg | ORAL_TABLET | Freq: Every day | ORAL | Status: DC
  Administered 2021-03-25 – 2021-04-08 (×15): 6 mg via ORAL
  Filled 2021-03-25 (×15): qty 2

## 2021-03-25 MED ORDER — ENSURE MAX PROTEIN PO LIQD
11.0000 [oz_av] | Freq: Three times a day (TID) | ORAL | Status: DC
  Administered 2021-03-25 – 2021-04-06 (×29): 11 [oz_av] via ORAL
  Filled 2021-03-25 (×36): qty 330

## 2021-03-25 MED ORDER — ADULT MULTIVITAMIN W/MINERALS CH
1.0000 | ORAL_TABLET | Freq: Every day | ORAL | Status: DC
  Administered 2021-03-26 – 2021-04-09 (×15): 1 via ORAL
  Filled 2021-03-25 (×15): qty 1

## 2021-03-25 MED ORDER — ACETAMINOPHEN 325 MG PO TABS: 650.0000 mg | ORAL_TABLET | Freq: Four times a day (QID) | ORAL | Status: DC | PRN

## 2021-03-25 MED ORDER — LIP MEDEX EX OINT
TOPICAL_OINTMENT | CUTANEOUS | Status: DC | PRN
  Filled 2021-03-25: qty 7

## 2021-03-25 MED ORDER — INSULIN GLARGINE-YFGN 100 UNIT/ML ~~LOC~~ SOLN
15.0000 [IU] | Freq: Every day | SUBCUTANEOUS | Status: DC
  Administered 2021-03-26 – 2021-04-09 (×14): 15 [IU] via SUBCUTANEOUS
  Filled 2021-03-25 (×16): qty 0.15

## 2021-03-25 MED ORDER — MELATONIN 3 MG PO TABS: 6.0000 mg | ORAL_TABLET | Freq: Every day | ORAL | 0 refills | Status: DC

## 2021-03-25 MED ORDER — HEPARIN SODIUM (PORCINE) 5000 UNIT/ML IJ SOLN: 5000.0000 [IU] | Freq: Three times a day (TID) | INTRAMUSCULAR | Status: DC

## 2021-03-25 MED ORDER — ADULT MULTIVITAMIN W/MINERALS CH: 1.0000 | ORAL_TABLET | Freq: Every day | ORAL | Status: DC

## 2021-03-25 MED ORDER — SODIUM CHLORIDE 0.9% FLUSH
10.0000 mL | Freq: Two times a day (BID) | INTRAVENOUS | Status: DC
  Administered 2021-03-25 – 2021-03-27 (×3): 10 mL
  Administered 2021-03-27: 20 mL
  Administered 2021-03-28 – 2021-04-03 (×10): 10 mL

## 2021-03-25 MED ORDER — SODIUM CHLORIDE 0.9 % IV SOLN: 2.0000 g | INTRAVENOUS | Status: DC

## 2021-03-25 MED ORDER — ACETAMINOPHEN 325 MG PO TABS
325.0000 mg | ORAL_TABLET | ORAL | Status: DC | PRN
  Administered 2021-03-27 – 2021-03-30 (×3): 650 mg via ORAL
  Filled 2021-03-25 (×5): qty 2

## 2021-03-25 MED ORDER — COLCHICINE 0.6 MG PO TABS: 0.6000 mg | ORAL_TABLET | Freq: Every day | ORAL | Status: DC

## 2021-03-25 MED ORDER — DIPHENHYDRAMINE HCL 12.5 MG/5ML PO ELIX: 12.5000 mg | ORAL_SOLUTION | Freq: Four times a day (QID) | ORAL | Status: DC | PRN

## 2021-03-25 MED ORDER — POLYETHYLENE GLYCOL 3350 17 G PO PACK
17.0000 g | PACK | Freq: Every day | ORAL | Status: DC | PRN
  Administered 2021-03-26 – 2021-04-02 (×2): 17 g via ORAL
  Filled 2021-03-25 (×2): qty 1

## 2021-03-25 MED ORDER — INSULIN ASPART 100 UNIT/ML IJ SOLN
0.0000 [IU] | Freq: Three times a day (TID) | INTRAMUSCULAR | Status: DC
  Administered 2021-03-26 – 2021-03-28 (×5): 3 [IU] via SUBCUTANEOUS
  Administered 2021-03-29: 1 [IU] via SUBCUTANEOUS
  Administered 2021-03-30 – 2021-04-03 (×6): 3 [IU] via SUBCUTANEOUS
  Administered 2021-04-04: 07:00:00 4 [IU] via SUBCUTANEOUS
  Administered 2021-04-05 – 2021-04-08 (×5): 3 [IU] via SUBCUTANEOUS

## 2021-03-25 MED ORDER — PROCHLORPERAZINE 25 MG RE SUPP: 12.5000 mg | Freq: Four times a day (QID) | RECTAL | Status: DC | PRN

## 2021-03-25 MED ORDER — POLYETHYLENE GLYCOL 3350 17 G PO PACK: 17.0000 g | PACK | Freq: Two times a day (BID) | ORAL | 0 refills | Status: DC

## 2021-03-25 MED ORDER — COLCHICINE 0.6 MG PO TABS
0.6000 mg | ORAL_TABLET | Freq: Every day | ORAL | Status: DC
  Administered 2021-03-26 – 2021-04-09 (×15): 0.6 mg via ORAL
  Filled 2021-03-25 (×15): qty 1

## 2021-03-25 MED ORDER — CARVEDILOL 6.25 MG PO TABS
6.2500 mg | ORAL_TABLET | Freq: Two times a day (BID) | ORAL | Status: DC
  Administered 2021-03-26 – 2021-04-06 (×23): 6.25 mg via ORAL
  Filled 2021-03-25 (×24): qty 1

## 2021-03-25 MED ORDER — LIDOCAINE 5 % EX PTCH
1.0000 | MEDICATED_PATCH | CUTANEOUS | Status: DC
  Filled 2021-03-25 (×7): qty 1

## 2021-03-25 MED ORDER — INSULIN ASPART 100 UNIT/ML IJ SOLN
0.0000 [IU] | Freq: Every day | INTRAMUSCULAR | Status: DC
Start: 2021-03-25 — End: 2021-04-09

## 2021-03-25 MED ORDER — GUAIFENESIN-DM 100-10 MG/5ML PO SYRP
5.0000 mL | ORAL_SOLUTION | Freq: Four times a day (QID) | ORAL | Status: DC | PRN
Start: 2021-03-25 — End: 2021-04-09

## 2021-03-25 MED ORDER — ALUM & MAG HYDROXIDE-SIMETH 200-200-20 MG/5ML PO SUSP: 30.0000 mL | ORAL | Status: DC | PRN

## 2021-03-25 MED ORDER — SODIUM CHLORIDE 0.9% FLUSH
10.0000 mL | INTRAVENOUS | Status: DC | PRN
  Administered 2021-03-28 – 2021-03-30 (×2): 10 mL

## 2021-03-25 MED ORDER — BISACODYL 10 MG RE SUPP: 10.0000 mg | Freq: Every day | RECTAL | Status: DC | PRN

## 2021-03-25 MED ORDER — OXYCODONE HCL 5 MG PO TABS
5.0000 mg | ORAL_TABLET | ORAL | Status: DC | PRN
  Administered 2021-03-25: 10 mg via ORAL
  Administered 2021-03-26 – 2021-04-02 (×4): 5 mg via ORAL
  Filled 2021-03-25: qty 1
  Filled 2021-03-25: qty 2
  Filled 2021-03-25 (×4): qty 1

## 2021-03-25 MED ORDER — SODIUM CHLORIDE 0.9 % IV SOLN
2.0000 g | INTRAVENOUS | Status: AC
  Administered 2021-03-25 – 2021-04-02 (×9): 2 g via INTRAVENOUS
  Filled 2021-03-25 (×10): qty 20

## 2021-03-25 MED ORDER — CHLORHEXIDINE GLUCONATE CLOTH 2 % EX PADS: 6.0000 | MEDICATED_PAD | Freq: Every day | CUTANEOUS | Status: DC

## 2021-03-25 MED ORDER — CARVEDILOL 6.25 MG PO TABS: 6.2500 mg | ORAL_TABLET | Freq: Two times a day (BID) | ORAL | Status: DC

## 2021-03-25 MED ORDER — FLEET ENEMA 7-19 GM/118ML RE ENEM: 1.0000 | ENEMA | Freq: Once | RECTAL | Status: DC | PRN

## 2021-03-25 MED ORDER — PANTOPRAZOLE SODIUM 40 MG PO TBEC: 40.0000 mg | DELAYED_RELEASE_TABLET | Freq: Two times a day (BID) | ORAL | Status: DC

## 2021-03-25 MED ORDER — PROCHLORPERAZINE MALEATE 5 MG PO TABS: 5.0000 mg | ORAL_TABLET | Freq: Four times a day (QID) | ORAL | Status: DC | PRN

## 2021-03-25 MED ORDER — ENOXAPARIN SODIUM 40 MG/0.4ML IJ SOSY
40.0000 mg | PREFILLED_SYRINGE | INTRAMUSCULAR | Status: DC
  Administered 2021-03-25 – 2021-03-28 (×4): 40 mg via SUBCUTANEOUS
  Filled 2021-03-25 (×4): qty 0.4

## 2021-03-25 MED ORDER — BLOOD PRESSURE CONTROL BOOK
Freq: Once | Status: AC
  Filled 2021-03-25: qty 1

## 2021-03-25 MED ORDER — TRAZODONE HCL 50 MG PO TABS
25.0000 mg | ORAL_TABLET | Freq: Every evening | ORAL | Status: DC | PRN
  Administered 2021-04-04: 23:00:00 50 mg via ORAL
  Filled 2021-03-25: qty 1

## 2021-03-25 MED ORDER — POLYETHYLENE GLYCOL 3350 17 G PO PACK
17.0000 g | PACK | Freq: Two times a day (BID) | ORAL | Status: DC
  Administered 2021-03-28 – 2021-04-06 (×12): 17 g via ORAL
  Filled 2021-03-25 (×23): qty 1

## 2021-03-25 MED ORDER — ENSURE ENLIVE PO LIQD: 237.0000 mL | Freq: Three times a day (TID) | ORAL | 12 refills | Status: DC

## 2021-03-25 MED ORDER — SORBITOL 70 % SOLN
60.0000 mL | Freq: Once | Status: DC
  Filled 2021-03-25: qty 60

## 2021-03-25 MED ORDER — GABAPENTIN 100 MG PO CAPS
100.0000 mg | ORAL_CAPSULE | Freq: Two times a day (BID) | ORAL | Status: DC
  Administered 2021-03-25 – 2021-04-09 (×30): 100 mg via ORAL
  Filled 2021-03-25 (×30): qty 1

## 2021-03-25 MED ORDER — NITROGLYCERIN 0.4 MG SL SUBL: 0.4000 mg | SUBLINGUAL_TABLET | SUBLINGUAL | Status: DC | PRN

## 2021-03-25 MED ORDER — PANTOPRAZOLE SODIUM 40 MG PO TBEC
40.0000 mg | DELAYED_RELEASE_TABLET | Freq: Two times a day (BID) | ORAL | Status: DC
  Administered 2021-03-25 – 2021-04-09 (×30): 40 mg via ORAL
  Filled 2021-03-25 (×30): qty 1

## 2021-03-25 MED ORDER — DICLOFENAC SODIUM 1 % EX GEL
2.0000 g | Freq: Four times a day (QID) | CUTANEOUS | Status: DC
  Administered 2021-03-25 – 2021-04-06 (×27): 2 g via TOPICAL
  Filled 2021-03-25: qty 100

## 2021-03-25 MED ORDER — METRONIDAZOLE 500 MG PO TABS
500.0000 mg | ORAL_TABLET | Freq: Two times a day (BID) | ORAL | Status: AC
  Administered 2021-03-25 – 2021-04-02 (×17): 500 mg via ORAL
  Filled 2021-03-25 (×19): qty 1

## 2021-03-25 MED ORDER — LIVING WELL WITH DIABETES BOOK
Freq: Once | Status: AC
  Filled 2021-03-25: qty 1

## 2021-03-25 MED ORDER — METHOCARBAMOL 500 MG PO TABS
500.0000 mg | ORAL_TABLET | Freq: Four times a day (QID) | ORAL | Status: DC | PRN
  Administered 2021-04-08: 500 mg via ORAL
  Filled 2021-03-25: qty 1

## 2021-03-25 MED ORDER — GABAPENTIN 100 MG PO CAPS: 200.0000 mg | ORAL_CAPSULE | Freq: Three times a day (TID) | ORAL | Status: DC

## 2021-03-25 MED ORDER — PROCHLORPERAZINE EDISYLATE 10 MG/2ML IJ SOLN: 5.0000 mg | Freq: Four times a day (QID) | INTRAMUSCULAR | Status: DC | PRN

## 2021-03-25 NOTE — Discharge Instructions (Signed)
[facility]      [facility] °[phone] ° °ABDOMINAL SURGERY: POST OP INSTRUCTIONS ° °Always review your discharge instruction sheet given to you by the facility where your surgery was performed. ° °IF YOU HAVE DISABILITY OR FAMILY LEAVE FORMS, YOU MUST BRING THEM TO THE OFFICE FOR PROCESSING.  PLEASE DO NOT GIVE THEM TO YOUR DOCTOR. ° °A prescription for pain medication may be given to you upon discharge.  Take your pain medication as prescribed, if needed.  If narcotic pain medicine is not needed, then you may take acetaminophen (Tylenol) or ibuprofen (Advil) as needed. °Take your usually prescribed medications unless otherwise directed. °If you need a refill on your pain medication, please contact your pharmacy. They will contact our office to request authorization.  Prescriptions will not be filled after 5pm or on week-ends. °You should follow a light diet the first few days after arrival home, such as soup and crackers, pudding, etc.unless your doctor has advised otherwise. A high-fiber, low fat diet can be resumed as tolerated.   Be sure to include lots of fluids daily. Most patients will experience some swelling and bruising on the chest and neck area.  Ice packs will help.  Swelling and bruising can take several days to resolve °Most patients will experience some swelling and bruising in the area of the incision. Ice pack will help. Swelling and bruising can take several days to resolve..  °It is common to experience some constipation if taking pain medication after surgery.  Increasing fluid intake and taking a stool softener will usually help or prevent this problem from occurring.  A mild laxative (Milk of Magnesia or Miralax) should be taken according to package directions if there are no bowel movements after 48 hours. ° You may have steri-strips (small skin tapes) in place directly over the incision.  These strips should be left on the skin for 7-10 days.  If your surgeon used skin glue on  the incision, you may shower in 24 hours.  The glue will flake off over the next 2-3 weeks.  Any sutures or staples will be removed at the office during your follow-up visit. You may find that a light gauze bandage over your incision may keep your staples from being rubbed or pulled. You may shower and replace the bandage daily. °ACTIVITIES:  You may resume regular (light) daily activities beginning the next day--such as daily self-care, walking, climbing stairs--gradually increasing activities as tolerated.  You may have sexual intercourse when it is comfortable.  Refrain from any heavy lifting or straining until approved by your doctor. °You may drive when you no longer are taking prescription pain medication, you can comfortably wear a seatbelt, and you can safely maneuver your car and apply brakes °Return to Work: ___________________________________ °You should see your doctor in the office for a follow-up appointment approximately two weeks after your surgery.  Make sure that you call for this appointment within a day or two after you arrive home to insure a convenient appointment time. °OTHER INSTRUCTIONS:  °_____________________________________________________________ °_____________________________________________________________ ° °WHEN TO CALL YOUR DOCTOR: °Fever over 101.0 °Inability to urinate °Nausea and/or vomiting °Extreme swelling or bruising °Continued bleeding from incision. °Increased pain, redness, or drainage from the incision. °Difficulty swallowing or breathing °Muscle cramping or spasms. °Numbness or tingling in hands or feet or around lips. ° °The clinic staff is available to answer your questions during regular business hours.  Please don’t hesitate to call and ask to speak to one of the   nurses if you have concerns. ° °For further questions, please visit [email or web address] ° °

## 2021-03-25 NOTE — Progress Notes (Signed)
Report give to Autumn. Pt going to 5 Room 13 for inpatient rehab.
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
## 2021-03-25 NOTE — PMR Pre-admission (Signed)
PMR Admission Coordinator Pre-Admission Assessment  Patient: Daniel Perkins is an 65 y.o., male MRN: [id number] DOB: 1956/01/13 Height: '5\' 7"'  (170.2 cm) Weight: 89.3 kg  Insurance Information HMO: yes    PPO:      PCP:      IPA:      80/20:      OTHER:  PRIMARY: [employer] Medicare      Policy#: [id number]      Subscriber: pt CM Name:       Phone#: [phone]     Fax#: [phone] Pre-Cert#: [id number] auth for CIR via expedited appeal, updates due to fax listed above on 11/23      Employer:  Benefits:  Phone #: [phone]     Name:  Eff. Date: 11/06/20     Deduct: $0      Out of Pocket Max: [phone] (met $181.33)      Life Max: n/a CIR: $1480/admit      SNF: 20 full days Outpatient: 100%     Co-Pay:  Home Health: 100%      Co-Pay:  DME: 80%     Co-Ins: 20% Providers:  SECONDARY:       Policy#:      Phone#:   Development worker, community:       Phone#:   The Therapist, art Information Summary" for patients in Inpatient Rehabilitation Facilities with attached "Privacy Act [place] Records" was provided and verbally reviewed with: Patient and Family  Emergency Contact Information Contact Information     Name Relation Home Work Mobile   [name] Sister [phone]     [name] Daughter   [phone]       Current Medical History  Patient Admitting Diagnosis: debility  History of Present Illness: Pt is a 65 year old male, presented to [facility] on 10/27 with abdominal pain.  Workup revealed SBO with perforation, underwent ex-lap for repair on 10/28.  PMH CAD, PAD, DM, HTN, and HLD.  Post op course complicated by persistent ileus requiring prolonged NG tube followed by TPN.  CT abdomen and pelvis on 11/2 showed nonspecific thickened and dilated small bowell, possible ileus/partial SBO and small pelvic fluid collection that was not amenable to drainage.  Pt started on IV Zosyn.  ID consulted on 11/10 for guidance on antibiotics.  Repeat CT abdomen and pelvis on 11/11  showed interval decrease in size of pelvic fluid collection but new 3.3 cm loculated fluid in subcutaneous plane in the anterior abdominal wall superior to umbilicus and posterior to skin staples.  Abx changed to IV CTX and Flagyl.  ID recommended continue these x2 weeks and then repeat CT scan.  TPN off on 11/12.  Pt had drainage from distal end of surgical site on 11/12, surgery doing WTD dressing BID.  Therapy ongoing and pt was recommended for CIR.      Patient's medical record from [facility] has been reviewed by the rehabilitation admission coordinator and physician.  Past Medical History  Past Medical History:  Diagnosis Date   Club foot of both lower extremities 1958   Coronary artery disease    Holter monitor 02/2012 - sinus with rare PVC   CTEV (congenital talipes equinovarus)    Gout    Hyperlipidemia    Hypertension    Myocardial infarction [facility]) ~ 2007   "mild"   Osteomyelitis ([place])    Pneumonia 2000's   "couple times"   Pneumonia due to COVID-19 virus 04/28/2019   Type II diabetes mellitus ([place]) 2011  Has the patient had major surgery during 100 days prior to admission? Yes  Family History   family history includes Coronary artery disease in his mother; Diabetes in his mother; Heart attack in his brother; Heart disease in his mother.  Current Medications  Current Facility-Administered Medications:    acetaminophen (TYLENOL) tablet 650 mg, 650 mg, Oral, Q6H PRN, Winferd Humphrey, PA-C, 650 mg at 03/19/21 2358   carvedilol (COREG) tablet 6.25 mg, 6.25 mg, Oral, BID AC, Cyndia Skeeters, Taye T, MD, 6.25 mg at 03/25/21 0844   cefTRIAXone (ROCEPHIN) 2 g in sodium chloride 0.9 % 100 mL IVPB, 2 g, Intravenous, Q24H, Manandhar, Sabina, MD, Last Rate: 200 mL/hr at 03/24/21 2051, 2 g at 03/24/21 2051   Chlorhexidine Gluconate Cloth 2 % PADS 6 each, 6 each, Topical, Daily, Regalado, Belkys A, MD, 6 each at 03/25/21 0937   colchicine tablet 0.6 mg, 0.6 mg, Oral, Daily, Regalado, Belkys  A, MD, 0.6 mg at 03/24/21 1011   diclofenac Sodium (VOLTAREN) 1 % topical gel 2 g, 2 g, Topical, QID, Regalado, Belkys A, MD, 2 g at 03/25/21 0936   feeding supplement (ENSURE ENLIVE / ENSURE PLUS) liquid 237 mL, 237 mL, Oral, TID BM, Kabrich, Martha H, PA-C, 237 mL at 03/25/21 0940   gabapentin (NEURONTIN) capsule 100 mg, 100 mg, Oral, BID, Regalado, Belkys A, MD, 100 mg at 03/25/21 0936   heparin injection 5,000 Units, 5,000 Units, Subcutaneous, Q8H, Regalado, Belkys A, MD, 5,000 Units at 03/25/21 0528   hydrALAZINE (APRESOLINE) injection 10 mg, 10 mg, Intravenous, Q6H PRN, Regalado, Belkys A, MD   HYDROmorphone (DILAUDID) injection 0.5 mg, 0.5 mg, Intravenous, Q3H PRN, Simaan, Elizabeth S, PA-C, 0.5 mg at 03/25/21 0934   insulin aspart (novoLOG) injection 0-20 Units, 0-20 Units, Subcutaneous, TID WC, von Dohlen, Haley B, RPH, 4 Units at 03/25/21 0904   insulin aspart (novoLOG) injection 0-5 Units, 0-5 Units, Subcutaneous, QHS, von Dohlen, Haley B, RPH   insulin glargine-yfgn (SEMGLEE) injection 15 Units, 15 Units, Subcutaneous, Daily, Gonfa, Taye T, MD, 15 Units at 03/25/21 0936   lidocaine (LIDODERM) 5 % 1 patch, 1 patch, Transdermal, Q24H, Simaan, Elizabeth S, PA-C, 1 patch at 03/22/21 0026   lip balm (CARMEX) ointment, , Topical, PRN, Meuth, Brooke A, PA-C   melatonin tablet 6 mg, 6 mg, Oral, QHS, Gonfa, Taye T, MD, 6 mg at 03/24/21 2052   methocarbamol (ROBAXIN) 500 mg in dextrose 5 % 50 mL IVPB, 500 mg, Intravenous, Q6H PRN, Regalado, Belkys A, MD   metroNIDAZOLE (FLAGYL) tablet 500 mg, 500 mg, Oral, Q12H, Manandhar, Collene Mares, MD, 500 mg at 03/25/21 0936   multivitamin with minerals tablet 1 tablet, 1 tablet, Oral, Daily, Wendee Beavers T, MD, 1 tablet at 03/25/21 0936   nitroGLYCERIN (NITROSTAT) SL tablet 0.4 mg, 0.4 mg, Sublingual, Q5 min PRN, Simaan, Elizabeth S, PA-C   ondansetron [facility]) injection 4 mg, 4 mg, Intravenous, Q6H PRN, Simaan, Elizabeth S, PA-C, 4 mg at 03/22/21 2218    oxyCODONE (Oxy IR/ROXICODONE) immediate release tablet 5-10 mg, 5-10 mg, Oral, Q4H PRN, Winferd Humphrey, PA-C, 5 mg at 03/24/21 1459   pantoprazole (PROTONIX) EC tablet 40 mg, 40 mg, Oral, BID, von Dohlen, Haley B, RPH, 40 mg at 03/25/21 0936   phenol (CHLORASEPTIC) mouth spray 1 spray, 1 spray, Mouth/Throat, PRN, Regalado, Belkys A, MD, 1 spray at 03/11/21 2340   polyethylene glycol (MIRALAX / GLYCOLAX) packet 17 g, 17 g, Oral, BID, Cornett, Thomas, MD, 17 g at 03/25/21 [id number]  sodium chloride flush (NS) 0.9 % injection 10-40 mL, 10-40 mL, Intracatheter, Q12H, Regalado, Belkys A, MD, 10 mL at 03/23/21 1001   sodium chloride flush (NS) 0.9 % injection 10-40 mL, 10-40 mL, Intracatheter, PRN, Regalado, Belkys A, MD, 10 mL at 03/13/21 0420  Patients Current Diet:  Diet Order             DIET SOFT Room service appropriate? Yes; Fluid consistency: Thin  Diet effective now                   Precautions / Restrictions Precautions Precautions: Other (comment) Precaution Comments: abdominal precautions Restrictions Weight Bearing Restrictions: No Other Position/Activity Restrictions: no lifting >10lbs   Has the patient had 2 or more falls or a fall with injury in the past year? No  Prior Activity Level Limited Community (1-2x/wk): independent prior to admit, not working, no DME  Prior Functional Level Self Care: Did the patient need help bathing, dressing, using the toilet or eating? Independent  Indoor Mobility: Did the patient need assistance with walking from room to room (with or without device)? Independent  Stairs: Did the patient need assistance with internal or external stairs (with or without device)? Independent  Functional Cognition: Did the patient need help planning regular tasks such as shopping or remembering to take medications? Independent  Patient Information Are you of Hispanic, Latino/a,or Spanish origin?: A. No, not of Hispanic, Latino/a, or Spanish  origin What is your race?: B. Black or African American Do you need or want an interpreter to communicate with a doctor or health care staff?: 0. No  Patient's Response To:  Health Literacy and Transportation Is the patient able to respond to health literacy and transportation needs?: Yes Health Literacy - How often do you need to have someone help you when you read instructions, pamphlets, or other written material from your doctor or pharmacy?: Rarely In the past 12 months, has lack of transportation kept you from medical appointments or from getting medications?: No In the past 12 months, has lack of transportation kept you from meetings, work, or from getting things needed for daily living?: No  Development worker, international aid / [place] Devices/Equipment: None Home Equipment: Shower seat  Prior Device Use: Indicate devices/aids used by the patient prior to current illness, exacerbation or injury? None of the above  Current Functional Level Cognition  Overall Cognitive Status: Within Functional Limits for tasks assessed Orientation Level: Oriented X4 General Comments: agreeable/eager to walk in hall    Extremity Assessment (includes Sensation/Coordination)  Upper Extremity Assessment: Overall WFL for tasks assessed  Lower Extremity Assessment: Defer to PT evaluation    ADLs  Overall ADL's : Needs assistance/impaired Eating/Feeding: Sitting, Set up Eating/Feeding Details (indicate cue type and reason): min A to open personal drinking tumbler to take meds, when upright in bed Grooming: Oral care, Standing, Min guard Grooming Details (indicate cue type and reason): Stated too much pain in feet when WB to attempt standing Upper Body Bathing: Minimal assistance, Sitting Lower Body Bathing: Minimal assistance, Sit to/from stand Upper Body Dressing : Minimal assistance, Sitting Upper Body Dressing Details (indicate cue type and reason): educated pt on UE dressing adhering to  precautions Lower Body Dressing: Moderate assistance, Sitting/lateral leans, Min guard Lower Body Dressing Details (indicate cue type and reason): able to pull up shoe once placed on foot, dons pants with min guard when moving from sit to stand Toilet Transfer: Min guard, Ambulation, Rolling walker (2 wheels), Copy  Details (indicate cue type and reason): simulated transfer to chair Toileting- Clothing Manipulation and Hygiene: Sitting/lateral lean, Set up Toileting - Clothing Manipulation Details (indicate cue type and reason): able to manage clothing/bedside urinal when toileting sitting EOB Functional mobility during ADLs: Min guard, Rolling walker (2 wheels) General ADL Comments: pt requiring v/c to stand/walk with upright posture    Mobility  Overal bed mobility: Modified Independent Bed Mobility: Sidelying to Sit Rolling: Min guard Sidelying to sit: Modified independent (Device/Increase time) Supine to sit: Supervision, HOB elevated Sit to supine: Min assist Sit to sidelying: Min guard General bed mobility comments: Transfers from supine > SL > sit with use of bedrail and mod I.    Transfers  Overall transfer level: Needs assistance Equipment used: Rolling walker (2 wheels) Transfers: Sit to/from Stand Sit to Stand: Modified independent (Device/Increase time), Supervision Stand pivot transfers: Min assist General transfer comment: Performs sit > stand x 3 throughout tx session with mod I; demos safety with hand placement during transition.  VCs to reach back during stand > sit for eccentric control.  Pt. utilizes rocking technique to build momentum when standing from low surfaces. Able to don shoes independently prior to transfer.    Ambulation / Gait / Stairs / Wheelchair Mobility  Ambulation/Gait Ambulation/Gait assistance: Counsellor (Feet): 450 Feet Assistive device: Rolling walker (2 wheels) Gait Pattern/deviations: Decreased step  length - right, Decreased step length - left, Trunk flexed General Gait Details: Amb in halls with CGA for safety.  Demos knee buckling x 1 on L LE but able to maintain balance.  Pt. demos SOB with activity and is encouraged to slow breathing and practice PLB to assist.  Takes 1 seated rest break at 225 ft.  Posture is improved today, but pt requires intermittent reminders to look up instead of down at floor. Gait velocity: decreased Gait velocity interpretation: <1.31 ft/sec, indicative of household ambulator Pre-gait activities: wgt shifting and postural correction    Posture / Balance Dynamic Sitting Balance Sitting balance - Comments: Assist to donn shoes sitting EOB due to discomfort in belly Balance Overall balance assessment: No apparent balance deficits (not formally assessed) Sitting-balance support: Feet supported, No upper extremity supported Sitting balance-Leahy Scale: Normal Sitting balance - Comments: Assist to donn shoes sitting EOB due to discomfort in belly Postural control: Posterior lean Standing balance support: Reliant on assistive device for balance, During functional activity Standing balance-Leahy Scale: Fair Standing balance comment: relies on RW/counter for support during static standing task at sink    Special needs/care consideration Skin WTD dressings BID and Diabetic management yes   Previous Home Environment (from acute therapy documentation) Living Arrangements: Alone Available Help at Discharge: Family, Friend(s), Available PRN/intermittently Type of Home: House Home Layout: One level Home Access: Stairs to enter, Ramped entrance Entrance Stairs-Rails: None Entrance Stairs-Number of Steps: 3 Bathroom Shower/Tub: Chiropodist: Standard Home Care Services: No Additional Comments: Reports a friend may be able to stay at [employer]  Discharge Living Setting Plans for Discharge Living Setting: Patient's home, Alone Type of Home at Discharge:  House Discharge Home Layout: One level Discharge Home Access: Stairs to enter, Ramped entrance Entrance Stairs-Rails: None Discharge Bathroom Shower/Tub: Tub/shower unit Discharge Bathroom Toilet: Standard Discharge Bathroom Accessibility: No Does the patient have any problems obtaining your medications?: No  Social/Family/Support Systems Anticipated Caregiver: intermittent assist from daughter Rodena Piety) and sister Harriette Ohara) Anticipated Caregiver's Contact Information: Rodena Piety [phone]; Harriette Ohara [phone] Ability/Limitations of Caregiver: works, only able to provide intermittent  assist Caregiver Availability: Intermittent Discharge Plan Discussed with Primary Caregiver: Yes Is Caregiver In Agreement with Plan?: Yes Does Caregiver/Family have Issues with Lodging/Transportation while Pt is in Rehab?: No  Goals Patient/Family Goal for Rehab: PT/OT mod I, SLP n/a Expected length of stay: 5-7 days Additional Information: BID dressing changes, needs education to be able to complete these as independently as possible Pt/Family Agrees to Admission and willing to participate: Yes Program Orientation Provided & Reviewed with Pt/Caregiver Including Roles  & Responsibilities: Yes  Barriers to Discharge: Insurance for SNF coverage, Wound Care, Decreased caregiver support  Decrease burden of Care through IP rehab admission: n/a  Possible need for SNF placement upon discharge: no  Patient Condition: I have reviewed medical records from [facility], spoken with CM, and patient and daughter. I met with patient at the bedside and discussed via phone for inpatient rehabilitation assessment.  Patient will benefit from ongoing PT and OT, can actively participate in 3 hours of therapy a day 5 days of the week, and can make measurable gains during the admission.  Patient will also benefit from the coordinated team approach during an Inpatient Acute Rehabilitation admission.  The patient will receive  intensive therapy as well as Rehabilitation physician, nursing, social worker, and care management interventions.  Due to safety, skin/wound care, disease management, medication administration, pain management, and patient education the patient requires 24 hour a day rehabilitation nursing.  The patient is currently supervision to min guard with mobility and basic ADLs.  Discharge setting and therapy post discharge at home with home health is anticipated.  Patient has agreed to participate in the Acute Inpatient Rehabilitation Program and will admit today.  Preadmission Screen Completed By:  Michel Santee, PT, DPT 03/25/2021 10:25 AM ______________________________________________________________________   Discussed status with Dr. Dagoberto Ligas on 03/25/21  at 10:38 AM  and received approval for admission today.  Admission Coordinator:  Michel Santee, PT, DPT time 10:38 AM Sudie Grumbling 03/25/21    Assessment/Plan: Diagnosis: Does the need for close, 24 hr/day Medical supervision in concert with the patient's rehab needs make it unreasonable for this patient to be served in a less intensive setting? Yes Co-Morbidities requiring supervision/potential complications: SBO with perforation, DM; ileus; pelvic abscess; open abd wound; CAD/PAD/HTN Due to bladder management, bowel management, safety, skin/wound care, disease management, medication administration, pain management, and patient education, does the patient require 24 hr/day rehab nursing? Yes Does the patient require coordinated care of a physician, rehab nurse, PT, OT, and SLP to address physical and functional deficits in the context of the above medical diagnosis(es)? Yes Addressing deficits in the following areas: balance, endurance, locomotion, strength, transferring, bowel/bladder control, bathing, dressing, feeding, grooming, and toileting Can the patient actively participate in an intensive therapy program of at least 3 hrs of therapy 5 days a  week? Yes The potential for patient to make measurable gains while on inpatient rehab is excellent Anticipated functional outcomes upon discharge from inpatient rehab: modified independent PT, modified independent OT, n/a SLP Estimated rehab length of stay to reach the above functional goals is: 5-7 days Anticipated discharge destination: Home 10. Overall Rehab/Functional Prognosis: excellent   MD Signature:

## 2021-03-25 NOTE — Progress Notes (Signed)
Progress Note  20 Days Post-Op  Subjective: Continues to tolerate soft diet and have bowel function. No abdominal pain, nausea or emesis. Ambulating  Objective: Vital signs in last 24 hours: Temp:  [97.5 F (36.4 C)-98.3 F (36.8 C)] 98 F (36.7 C) (11/17 0741) Pulse Rate:  [77-83] 79 (11/17 0741) Resp:  [16-19] 19 (11/17 0741) BP: (110-128)/(68-82) 128/82 (11/17 0741) SpO2:  [93 %-100 %] 100 % (11/17 0741) Last BM Date: 03/22/21  Intake/Output from previous day: No intake/output data recorded. Intake/Output this shift: No intake/output data recorded.  PE: General: pleasant, WD, male who is laying in bed in NAD HEENT: head is normocephalic, atraumatic.  Mouth is pink and moist Heart: regular, rate, and rhythm.   Palpable radial and pedal pulses bilaterally Lungs: CTAB. Respiratory effort nonlabored Abd: soft, ND, +BS, very mild TTP without rebound or gaurding over midline incision. Incision with distal portion skin open (2x2cm) with beefy red granulation tissue. Cavity tracks deeply at cranial apex of wound and up under epidermis along incision line. I placed a 1/4" penrose drain up under incision line and sutured in place MSK: all 4 extremities are symmetrical with no cyanosis, clubbing, or edema. Skin: warm and dry with no masses, lesions, or rashes Psych: A&Ox3 with an appropriate affect.    Lab Results:  Recent Labs    03/23/21 0314  WBC 6.0  HGB 10.4*  HCT 33.6*  PLT 440*    BMET No results for input(s): NA, K, CL, CO2, GLUCOSE, BUN, CREATININE, CALCIUM in the last 72 hours. PT/INR No results for input(s): LABPROT, INR in the last 72 hours. CMP     Component Value Date/Time   NA 132 (L) 03/19/2021 0414   NA 137 02/10/2021 1538   K 4.6 03/19/2021 0414   CL 103 03/19/2021 0414   CO2 23 03/19/2021 0414   GLUCOSE 170 (H) 03/19/2021 0414   BUN 23 03/19/2021 0414   BUN 13 02/10/2021 1538   CREATININE 0.86 03/19/2021 0414   CREATININE 1.22 06/30/2014 1634    CALCIUM 9.0 03/19/2021 0414   PROT 7.8 03/18/2021 0327   PROT 7.2 12/06/2018 1648   ALBUMIN 2.1 (L) 03/19/2021 0414   ALBUMIN 4.2 12/06/2018 1648   AST 37 03/18/2021 0327   ALT 33 03/18/2021 0327   ALKPHOS 84 03/18/2021 0327   BILITOT 0.6 03/18/2021 0327   BILITOT 0.3 12/06/2018 1648   GFRNONAA >60 03/19/2021 0414   GFRNONAA 78 10/23/2013 1554   GFRAA 86 06/09/2020 1508   GFRAA >89 10/23/2013 1554   Lipase  No results found for: LIPASE     Studies/Results: No results found.  Anti-infectives: Anti-infectives (From admission, onward)    Start     Dose/Rate Route Frequency Ordered Stop   03/18/21 2200  cefTRIAXone (ROCEPHIN) 2 g in sodium chloride 0.9 % 100 mL IVPB        2 g 200 mL/hr over 30 Minutes Intravenous Every 24 hours 03/18/21 1514     03/18/21 2200  metroNIDAZOLE (FLAGYL) tablet 500 mg        500 mg Oral Every 12 hours 03/18/21 1514     03/11/21 0900  piperacillin-tazobactam (ZOSYN) IVPB 3.375 g  Status:  Discontinued        3.375 g 12.5 mL/hr over 240 Minutes Intravenous Every 8 hours 03/11/21 0815 03/18/21 1514   03/05/21 0845  cefoTEtan (CEFOTAN) 2 g in sodium chloride 0.9 % 100 mL IVPB        2 g 200 mL/hr  over 30 Minutes Intravenous To [facility] 03/04/21 2206 03/05/21 1035        Assessment/Plan  SBO with no previous abdominal surgical history  POD#20S/P diagnostic laparoscopy, lysis of single adhesive band in the left lower quadrant with resolution of bowel obstruction, mini laparotomy with primary repair of 2 mm small bowel perforation on 03/05/21 Dr. Fredricka Bonine - CT scan 11/11  improved 2 subcutaneous collections noted - Fluid collection on CT small and not amenable to drainage.  - tolerating soft diet. TPN stopped - Mobilize. Working with PT/OT who are currently recommending CIR and hopefully discharging to CIR soon - staples out 11/13 - small seroma noted. Continue bid wet to dry dressing changes - moistened plain guaze packing strips or 2  inch kerlix. I placed a penrose drain along the incision line to prevent fluid collection formation in this area which otherwise would be difficult to pack. Continue to pack 2x2x2 cm open portion as above. Drain can be removed in 2 weeks in our clinic. See separate procedure note for further details  Stable for discharge to CIR from surgical perspective  ID - cefotetan periop 10/28, zosyn 11/3>11/10. Rocephin/flagyl 11/10>> VTE - SCDs, sq heparin FEN - IVF, soft, ensure tid, TPN off  Foley - out    DM HTN CAD h/o MI several years ago - followed by Dr. Eldridge Dace PAD HLD Chronic neck pain   LOS: 21 days    Eric Form, [facility] 03/25/2021, 7:54 AM Please see Amion for pager number during day hours 7:00am-4:30pm

## 2021-03-25 NOTE — Progress Notes (Signed)
PMR Admission Coordinator Pre-Admission Assessment   Patient: Daniel Perkins is an 65 y.o., male MRN: [id number] DOB: 02/19/1956 Height: '5\' 7"'  (170.2 cm) Weight: 89.3 kg   Insurance Information HMO: yes    PPO:      PCP:      IPA:      80/20:      OTHER:  PRIMARY: [employer] Medicare      Policy#: [id number]      Subscriber: pt CM Name:       Phone#: [phone]     Fax#: [phone] Pre-Cert#: [id number] auth for CIR via expedited appeal, updates due to fax listed above on 11/23      Employer:  Benefits:  Phone #: [phone]     Name:  Eff. Date: 11/06/20     Deduct: $0      Out of Pocket Max: [phone] (met $181.33)      Life Max: n/a CIR: $1480/admit      SNF: 20 full days Outpatient: 100%     Co-Pay:  Home Health: 100%      Co-Pay:  DME: 80%     Co-Ins: 20% Providers:  SECONDARY:       Policy#:      Phone#:    Development worker, community:       Phone#:    The Therapist, art Information Summary" for patients in Inpatient Rehabilitation Facilities with attached "Privacy Act [place] Records" was provided and verbally reviewed with: Patient and Family   Emergency Contact Information Contact Information       Name Relation Home Work Mobile    [place] Sister [phone]        [name] Daughter     [phone]           Current Medical History  Patient Admitting Diagnosis: debility   History of Present Illness: Pt is a 65 year old male, presented to [facility] on 10/27 with abdominal pain.  Workup revealed SBO with perforation, underwent ex-lap for repair on 10/28.  PMH CAD, PAD, DM, HTN, and HLD.  Post op course complicated by persistent ileus requiring prolonged NG tube followed by TPN.  CT abdomen and pelvis on 11/2 showed nonspecific thickened and dilated small bowell, possible ileus/partial SBO and small pelvic fluid collection that was not amenable to drainage.  Pt started on IV Zosyn.  ID consulted on 11/10 for guidance on antibiotics.  Repeat CT abdomen and  pelvis on 11/11 showed interval decrease in size of pelvic fluid collection but new 3.3 cm loculated fluid in subcutaneous plane in the anterior abdominal wall superior to umbilicus and posterior to skin staples.  Abx changed to IV CTX and Flagyl.  ID recommended continue these x2 weeks and then repeat CT scan.  TPN off on 11/12.  Pt had drainage from distal end of surgical site on 11/12, surgery doing WTD dressing BID.  Therapy ongoing and pt was recommended for CIR.     Patient's medical record from [facility] has been reviewed by the rehabilitation admission coordinator and physician.   Past Medical History      Past Medical History:  Diagnosis Date   Club foot of both lower extremities 1958   Coronary artery disease      Holter monitor 02/2012 - sinus with rare PVC   CTEV (congenital talipes equinovarus)     Gout     Hyperlipidemia     Hypertension     Myocardial infarction ([place]) ~ 2007    "mild"  Osteomyelitis ([place])     Pneumonia 2000's    "couple times"   Pneumonia due to COVID-19 virus 04/28/2019   Type II diabetes mellitus ([place]) 2011      Has the patient had major surgery during 100 days prior to admission? Yes   Family History   family history includes Coronary artery disease in his mother; Diabetes in his mother; Heart attack in his brother; Heart disease in his mother.   Current Medications   Current Facility-Administered Medications:    acetaminophen (TYLENOL) tablet 650 mg, 650 mg, Oral, Q6H PRN, Winferd Humphrey, PA-C, 650 mg at 03/19/21 2358   carvedilol (COREG) tablet 6.25 mg, 6.25 mg, Oral, BID AC, Cyndia Skeeters, Taye T, MD, 6.25 mg at 03/25/21 0844   cefTRIAXone (ROCEPHIN) 2 g in sodium chloride 0.9 % 100 mL IVPB, 2 g, Intravenous, Q24H, Manandhar, Sabina, MD, Last Rate: 200 mL/hr at 03/24/21 2051, 2 g at 03/24/21 2051   Chlorhexidine Gluconate Cloth 2 % PADS 6 each, 6 each, Topical, Daily, Regalado, Belkys A, MD, 6 each at 03/25/21 0937   colchicine tablet 0.6 mg, 0.6  mg, Oral, Daily, Regalado, Belkys A, MD, 0.6 mg at 03/24/21 1011   diclofenac Sodium (VOLTAREN) 1 % topical gel 2 g, 2 g, Topical, QID, Regalado, Belkys A, MD, 2 g at 03/25/21 0936   feeding supplement (ENSURE ENLIVE / ENSURE PLUS) liquid 237 mL, 237 mL, Oral, TID BM, Kabrich, Martha H, PA-C, 237 mL at 03/25/21 0940   gabapentin (NEURONTIN) capsule 100 mg, 100 mg, Oral, BID, Regalado, Belkys A, MD, 100 mg at 03/25/21 0936   heparin injection 5,000 Units, 5,000 Units, Subcutaneous, Q8H, Regalado, Belkys A, MD, 5,000 Units at 03/25/21 0528   hydrALAZINE (APRESOLINE) injection 10 mg, 10 mg, Intravenous, Q6H PRN, Regalado, Belkys A, MD   HYDROmorphone (DILAUDID) injection 0.5 mg, 0.5 mg, Intravenous, Q3H PRN, Simaan, Elizabeth S, PA-C, 0.5 mg at 03/25/21 0934   insulin aspart (novoLOG) injection 0-20 Units, 0-20 Units, Subcutaneous, TID WC, von Dohlen, Haley B, RPH, 4 Units at 03/25/21 0904   insulin aspart (novoLOG) injection 0-5 Units, 0-5 Units, Subcutaneous, QHS, von Dohlen, Haley B, RPH   insulin glargine-yfgn (SEMGLEE) injection 15 Units, 15 Units, Subcutaneous, Daily, Gonfa, Taye T, MD, 15 Units at 03/25/21 0936   lidocaine (LIDODERM) 5 % 1 patch, 1 patch, Transdermal, Q24H, Simaan, Elizabeth S, PA-C, 1 patch at 03/22/21 0026   lip balm (CARMEX) ointment, , Topical, PRN, Meuth, Brooke A, PA-C   melatonin tablet 6 mg, 6 mg, Oral, QHS, Gonfa, Taye T, MD, 6 mg at 03/24/21 2052   methocarbamol (ROBAXIN) 500 mg in dextrose 5 % 50 mL IVPB, 500 mg, Intravenous, Q6H PRN, Regalado, Belkys A, MD   metroNIDAZOLE (FLAGYL) tablet 500 mg, 500 mg, Oral, Q12H, Manandhar, Collene Mares, MD, 500 mg at 03/25/21 0936   multivitamin with minerals tablet 1 tablet, 1 tablet, Oral, Daily, Wendee Beavers T, MD, 1 tablet at 03/25/21 0936   nitroGLYCERIN (NITROSTAT) SL tablet 0.4 mg, 0.4 mg, Sublingual, Q5 min PRN, Simaan, Elizabeth S, PA-C   ondansetron [facility]) injection 4 mg, 4 mg, Intravenous, Q6H PRN, Simaan, Elizabeth S, PA-C,  4 mg at 03/22/21 2218   oxyCODONE (Oxy IR/ROXICODONE) immediate release tablet 5-10 mg, 5-10 mg, Oral, Q4H PRN, Winferd Humphrey, PA-C, 5 mg at 03/24/21 1459   pantoprazole (PROTONIX) EC tablet 40 mg, 40 mg, Oral, BID, von Dohlen, Haley B, RPH, 40 mg at 03/25/21 0936   phenol (CHLORASEPTIC) mouth spray 1 spray,  1 spray, Mouth/Throat, PRN, Regalado, Belkys A, MD, 1 spray at 03/11/21 2340   polyethylene glycol (MIRALAX / GLYCOLAX) packet 17 g, 17 g, Oral, BID, Cornett, Thomas, MD, 17 g at 03/25/21 0937   sodium chloride flush (NS) 0.9 % injection 10-40 mL, 10-40 mL, Intracatheter, Q12H, Regalado, Belkys A, MD, 10 mL at 03/23/21 1001   sodium chloride flush (NS) 0.9 % injection 10-40 mL, 10-40 mL, Intracatheter, PRN, Regalado, Belkys A, MD, 10 mL at 03/13/21 0420   Patients Current Diet:  Diet Order                  DIET SOFT Room service appropriate? Yes; Fluid consistency: Thin  Diet effective now                         Precautions / Restrictions Precautions Precautions: Other (comment) Precaution Comments: abdominal precautions Restrictions Weight Bearing Restrictions: No Other Position/Activity Restrictions: no lifting >10lbs    Has the patient had 2 or more falls or a fall with injury in the past year? No   Prior Activity Level Limited Community (1-2x/wk): independent prior to admit, not working, no DME   Prior Functional Level Self Care: Did the patient need help bathing, dressing, using the toilet or eating? Independent   Indoor Mobility: Did the patient need assistance with walking from room to room (with or without device)? Independent   Stairs: Did the patient need assistance with internal or external stairs (with or without device)? Independent   Functional Cognition: Did the patient need help planning regular tasks such as shopping or remembering to take medications? Independent   Patient Information Are you of Hispanic, Latino/a,or Spanish origin?: A. No, not  of Hispanic, Latino/a, or Spanish origin What is your race?: B. Black or African American Do you need or want an interpreter to communicate with a doctor or health care staff?: 0. No   Patient's Response To:  Health Literacy and Transportation Is the patient able to respond to health literacy and transportation needs?: Yes Health Literacy - How often do you need to have someone help you when you read instructions, pamphlets, or other written material from your doctor or pharmacy?: Rarely In the past 12 months, has lack of transportation kept you from medical appointments or from getting medications?: No In the past 12 months, has lack of transportation kept you from meetings, work, or from getting things needed for daily living?: No   Development worker, international aid / [place] Devices/Equipment: None Home Equipment: Shower seat   Prior Device Use: Indicate devices/aids used by the patient prior to current illness, exacerbation or injury? None of the above   Current Functional Level Cognition   Overall Cognitive Status: Within Functional Limits for tasks assessed Orientation Level: Oriented X4 General Comments: agreeable/eager to walk in hall    Extremity Assessment (includes Sensation/Coordination)   Upper Extremity Assessment: Overall WFL for tasks assessed  Lower Extremity Assessment: Defer to PT evaluation     ADLs   Overall ADL's : Needs assistance/impaired Eating/Feeding: Sitting, Set up Eating/Feeding Details (indicate cue type and reason): min A to open personal drinking tumbler to take meds, when upright in bed Grooming: Oral care, Standing, Min guard Grooming Details (indicate cue type and reason): Stated too much pain in feet when WB to attempt standing Upper Body Bathing: Minimal assistance, Sitting Lower Body Bathing: Minimal assistance, Sit to/from stand Upper Body Dressing : Minimal assistance, Sitting Upper Body Dressing Details (  indicate cue type and  reason): educated pt on UE dressing adhering to precautions Lower Body Dressing: Moderate assistance, Sitting/lateral leans, Min guard Lower Body Dressing Details (indicate cue type and reason): able to pull up shoe once placed on foot, dons pants with min guard when moving from sit to stand Toilet Transfer: Min guard, Ambulation, Rolling walker (2 wheels), Supervision/safety Toilet Transfer Details (indicate cue type and reason): simulated transfer to chair Toileting- Clothing Manipulation and Hygiene: Sitting/lateral lean, Set up Toileting - Clothing Manipulation Details (indicate cue type and reason): able to manage clothing/bedside urinal when toileting sitting EOB Functional mobility during ADLs: Min guard, Rolling walker (2 wheels) General ADL Comments: pt requiring v/c to stand/walk with upright posture     Mobility   Overal bed mobility: Modified Independent Bed Mobility: Sidelying to Sit Rolling: Min guard Sidelying to sit: Modified independent (Device/Increase time) Supine to sit: Supervision, HOB elevated Sit to supine: Min assist Sit to sidelying: Min guard General bed mobility comments: Transfers from supine > SL > sit with use of bedrail and mod I.     Transfers   Overall transfer level: Needs assistance Equipment used: Rolling walker (2 wheels) Transfers: Sit to/from Stand Sit to Stand: Modified independent (Device/Increase time), Supervision Stand pivot transfers: Min assist General transfer comment: Performs sit > stand x 3 throughout tx session with mod I; demos safety with hand placement during transition.  VCs to reach back during stand > sit for eccentric control.  Pt. utilizes rocking technique to build momentum when standing from low surfaces. Able to don shoes independently prior to transfer.     Ambulation / Gait / Stairs / Wheelchair Mobility   Ambulation/Gait Ambulation/Gait assistance: Counsellor (Feet): 450 Feet Assistive device: Rolling  walker (2 wheels) Gait Pattern/deviations: Decreased step length - right, Decreased step length - left, Trunk flexed General Gait Details: Amb in halls with CGA for safety.  Demos knee buckling x 1 on L LE but able to maintain balance.  Pt. demos SOB with activity and is encouraged to slow breathing and practice PLB to assist.  Takes 1 seated rest break at 225 ft.  Posture is improved today, but pt requires intermittent reminders to look up instead of down at floor. Gait velocity: decreased Gait velocity interpretation: <1.31 ft/sec, indicative of household ambulator Pre-gait activities: wgt shifting and postural correction     Posture / Balance Dynamic Sitting Balance Sitting balance - Comments: Assist to donn shoes sitting EOB due to discomfort in belly Balance Overall balance assessment: No apparent balance deficits (not formally assessed) Sitting-balance support: Feet supported, No upper extremity supported Sitting balance-Leahy Scale: Normal Sitting balance - Comments: Assist to donn shoes sitting EOB due to discomfort in belly Postural control: Posterior lean Standing balance support: Reliant on assistive device for balance, During functional activity Standing balance-Leahy Scale: Fair Standing balance comment: relies on RW/counter for support during static standing task at sink     Special needs/care consideration Skin WTD dressings BID and Diabetic management yes    Previous Home Environment (from acute therapy documentation) Living Arrangements: Alone Available Help at Discharge: Family, Friend(s), Available PRN/intermittently Type of Home: House Home Layout: One level Home Access: Stairs to enter, Ramped entrance Entrance Stairs-Rails: None Entrance Stairs-Number of Steps: 3 Bathroom Shower/Tub: Chiropodist: Standard Home Care Services: No Additional Comments: Reports a friend may be able to stay at [facility]   Discharge Living Setting Plans for Discharge  Living Setting: Patient's home, Alone Type  of Home at Discharge: House Discharge Home Layout: One level Discharge Home Access: Stairs to enter, Ramped entrance Entrance Stairs-Rails: None Discharge Bathroom Shower/Tub: Tub/shower unit Discharge Bathroom Toilet: Standard Discharge Bathroom Accessibility: No Does the patient have any problems obtaining your medications?: No   Social/Family/Support Systems Anticipated Caregiver: intermittent assist from daughter Rodena Piety) and sister Harriette Ohara) Anticipated Caregiver's Contact Information: Rodena Piety [phone]; Harriette Ohara [phone] Ability/Limitations of Caregiver: works, only able to provide intermittent assist Caregiver Availability: Intermittent Discharge Plan Discussed with Primary Caregiver: Yes Is Caregiver In Agreement with Plan?: Yes Does Caregiver/Family have Issues with Lodging/Transportation while Pt is in Rehab?: No   Goals Patient/Family Goal for Rehab: PT/OT mod I, SLP n/a Expected length of stay: 5-7 days Additional Information: BID dressing changes, needs education to be able to complete these as independently as possible Pt/Family Agrees to Admission and willing to participate: Yes Program Orientation Provided & Reviewed with Pt/Caregiver Including Roles  & Responsibilities: Yes  Barriers to Discharge: Insurance for SNF coverage, Wound Care, Decreased caregiver support   Decrease burden of Care through IP rehab admission: n/a   Possible need for SNF placement upon discharge: no   Patient Condition: I have reviewed medical records from [facility], spoken with CM, and patient and daughter. I met with patient at the bedside and discussed via phone for inpatient rehabilitation assessment.  Patient will benefit from ongoing PT and OT, can actively participate in 3 hours of therapy a day 5 days of the week, and can make measurable gains during the admission.  Patient will also benefit from the coordinated team approach during  an Inpatient Acute Rehabilitation admission.  The patient will receive intensive therapy as well as Rehabilitation physician, nursing, social worker, and care management interventions.  Due to safety, skin/wound care, disease management, medication administration, pain management, and patient education the patient requires 24 hour a day rehabilitation nursing.  The patient is currently supervision to min guard with mobility and basic ADLs.  Discharge setting and therapy post discharge at home with home health is anticipated.  Patient has agreed to participate in the Acute Inpatient Rehabilitation Program and will admit today.   Preadmission Screen Completed By:  Michel Santee, PT, DPT 03/25/2021 10:25 AM ______________________________________________________________________   Discussed status with Dr. Dagoberto Ligas on 03/25/21  at 10:38 AM  and received approval for admission today.   Admission Coordinator:  Michel Santee, PT, DPT time 10:38 AM Sudie Grumbling 03/25/21     Assessment/Plan: Diagnosis: Does the need for close, 24 hr/day Medical supervision in concert with the patient's rehab needs make it unreasonable for this patient to be served in a less intensive setting? Yes Co-Morbidities requiring supervision/potential complications: SBO with perforation, DM; ileus; pelvic abscess; open abd wound; CAD/PAD/HTN Due to bladder management, bowel management, safety, skin/wound care, disease management, medication administration, pain management, and patient education, does the patient require 24 hr/day rehab nursing? Yes Does the patient require coordinated care of a physician, rehab nurse, PT, OT, and SLP to address physical and functional deficits in the context of the above medical diagnosis(es)? Yes Addressing deficits in the following areas: balance, endurance, locomotion, strength, transferring, bowel/bladder control, bathing, dressing, feeding, grooming, and toileting Can the patient actively participate  in an intensive therapy program of at least 3 hrs of therapy 5 days a week? Yes The potential for patient to make measurable gains while on inpatient rehab is excellent Anticipated functional outcomes upon discharge from inpatient rehab: modified independent PT, modified independent OT, n/a SLP Estimated  rehab length of stay to reach the above functional goals is: 5-7 days Anticipated discharge destination: Home 10. Overall Rehab/Functional Prognosis: excellent     MD Signature:

## 2021-03-25 NOTE — Procedures (Signed)
Penrose Drain Placement Procedure Note  Pre-operative Diagnosis: abdominal wall seroma  Post-operative Diagnosis: same  Indications: abdominal wall seroma  Anesthesia: 1% plain lidocaine  Procedure Details  The procedure, risks and complications have been discussed in detail with the patient and his sister via telephone per patient's request and verbal consent obtained.  The skin was sterilely prepped and draped over the affected area in the usual fashion. After adequate local anesthesia, 1/4" penrose drain inserted into wound cavity cranially along healing incision line. Sutured in place.  Saline moistened packing strips were placed in distal abscess cavity. The patient was observed until stable.  Findings: Seroma. Wound cavity  EBL: none  Drains/Packing: penrose drain. Saline moistened packing strips  Condition: Tolerated procedure well and Stable   Complications: none.  Eric Form, PA-C

## 2021-03-25 NOTE — Progress Notes (Signed)
Pt admitted to rehab A&O x 4. Accompanied by [facility], RN. Pt oriented to rehab and aware of policies.  Hilary and I did a skin assessment/wound assessment. Measurements are as follows. Length-2.9cm Width-2.2cm Depth-2.3cm Wound pinkish/red with minimal drainage. Repacked and put new dressing on.  Ambriella Kitt [place]

## 2021-03-25 NOTE — Progress Notes (Signed)
Inpatient Rehab Admissions Coordinator:   I have a bed for this patient to admit to CIR today.  Dr. Alanda Slim in agreement.  Pt/family and TOC team aware.    Estill Dooms, PT, DPT Admissions Coordinator [phone] 03/25/21  10:21 AM

## 2021-03-25 NOTE — Discharge Summary (Signed)
Physician Discharge Summary  Daniel Perkins [id number] DOB: March 25, 1956 DOA: 03/04/2021  PCP: Roselee Nova, MD  Admit date: 03/04/2021 Discharge date: 03/25/2021 Admitted From: Home Disposition: [facility] Recommendations for Outpatient Follow-up:  Follow ups as below. Please obtain CBC/BMP/Mag at follow up CT abdomen and pelvis on 04/01/2021. Notified infectious disease about CT finding Please follow up on the following pending results: None   Discharge Condition: Stable CODE STATUS: Full code  Hospital Course: 65 year old M with PMH of CAD, PAD, DM-2, HTN and HLD presenting with abdominal pain and found to have SBO for which she underwent ex lap with small bowel perforation repair on 03/05/2021.  Postop course complicated by persistent ileus requiring NG tube followed by TPN.  CT abdomen and pelvis on 03/10/2021 with nonspecific thickened and dilated small bowel, possible ileus/partial SBO and small pelvic fluid collection that was not amenable to drainage.  Patient was started on IV Zosyn on 11/3. ID consulted on 11/10 for guidance on his antibiotics.  Repeat CT abdomen and pelvis on 11/11 showed interval decrease in size of pelvic fluid collection but new 3.3 cm loculated fluid in subcutaneous plane in the anterior abdominal wall superior to umbilicus and posterior to skin staples.  Antibiotics de-escalated to IV CTX and Flagyl.  ID recommended CTX and Flagyl for 2 weeks from 11/11, repeat CT abdomen and pelvis in 2 weeks and reconsult based on CT result.  TPN off on 11/12.    Patient had some drainage from distal end of surgical site on 11/12.  Surgery recommended WTD dressing twice daily.   Medically stable for transfer to [facility]  See individual problem list below for more on hospital course.  Discharge Diagnoses:  SBO due to adhesion s/p laparoscopy, lysis of adhesion and mini laparotomy for small perf repair Persistent postoperative ileus-seems to have resolved.  Possible  enteritis/pelvic abscess in the setting of the above -CT A/P on 11/2 with small bowel wall thickening and mesenteric edema with dilated bowel proximally raising concern for possible enteritis, inflammatory process or ischemic changes, and new enhancing small fluid collection in pelvis measuring 3.0 x 3.1 x 4.2 cm. -CT A/P on 11/11 howed interval decrease in size of pelvic fluid collection but new 3.3 cm loculated fluid in subcutaneous plane in the anterior abdominal wall superior to umbilicus and posterior to skin staples. -CRP elevated to 9.5.  Leukocytosis resolved. -11/12-some serosanguineous drainage from distal end of surgical wound-packing and BID WTD dressing -Off TPN on 11/12.  Tolerating soft diet and having BMs. -ID consulted appreciate recs -IV Zosyn 11/3>> CTX +Flagyl 11/11-11/25.  -Repeat CT A/P in 2 weeks, on 04/01/2021 to reassess intra-abdominal fluid collection -Continue mobilizing patient.     Controlled NIDDM-2 with hyperglycemia: A1c 6.0% on 10/27.  On Victoza, Jardiance and glipizide alone. -Continue home meds with low-dose Crestor.   AKI/azotemia: Resolved.   Essential hypertension: Normotensive -Continue Coreg 6.25 mg twice daily.   Bilateral feet pain: has history of gout but uric acid normal.  History of neuropathy.  Pain resolved. -Continue colchicine, gabapentin and allopurinol.   History of CAD: No chest pain or anginal symptoms. -Continue home Coreg and aspirin   Hyperlipidemia: Has history of statin intolerance. -Continue low dose Crestor.  Could benefit from PCSK9 inhibitors   Hyponatremia: Improved. -Continue monitoring   Microcytic anemia: H&H relatively stable.  Anemia panel basically normal.   Leukocytosis/bandemia: Likely due to #1.  Resolved.   Physical deconditioning- -Continue therapy at [facility]  Paperwork: completed daughter's FMLA paper  with patient's permission about medical disclosure.    Moderate malnutrition: As evidenced by  significant muscle mass and subcu fat loss in lower extremities Body mass index is 30.83 kg/m. Nutrition Problem: Moderate Malnutrition Etiology: acute illness (Bowel obstruction) Signs/Symptoms: mild fat depletion, mild muscle depletion, energy intake < or equal to 50% for > or equal to 5 days Interventions: Ensure Enlive (each supplement provides 350kcal and 20 grams of protein), MVI     Discharge Exam: Vitals:   03/24/21 1534 03/24/21 2059 03/25/21 0444 03/25/21 0741  BP: 110/68 117/74 124/73 128/82  Pulse: 83 81 77 79  Temp: (!) 97.5 F (36.4 C) 98.3 F (36.8 C) 98.3 F (36.8 C) 98 F (36.7 C)  Resp: 16 17 17 19   Height:      Weight:      SpO2: 99% 93% 98% 100%  TempSrc: Oral Oral Oral Oral  BMI (Calculated):         GENERAL: No apparent distress.  Nontoxic. HEENT: MMM.  Vision and hearing grossly intact.  NECK: Supple.  No apparent JVD.  RESP:  No IWOB.  Fair aeration bilaterally. CVS:  RRR. Heart sounds normal.  ABD/GI/GU: Bowel sounds present. Soft. Non tender.  Dressing over surgical site DCI. MSK/EXT:  Moves extremities.  Significant muscle mass and subcu fat loss. SKIN: no apparent skin lesion or wound NEURO: Awake and alert.  Oriented appropriately.  No apparent focal neuro deficit. PSYCH: Calm. Normal affect.   Discharge Instructions   Allergies as of 03/25/2021       Reactions   Morphine Itching   Pravastatin Sodium Other (See Comments)   Severe muscle cramps with one time requiring admission.         Medication List     STOP taking these medications    amLODipine 10 MG tablet Commonly known as: NORVASC   naproxen 500 MG tablet Commonly known as: NAPROSYN   valsartan 160 MG tablet Commonly known as: DIOVAN   valsartan 80 MG tablet Commonly known as: Diovan       TAKE these medications    Accu-Chek Guide test strip Generic drug: glucose blood Check once a day   Accu-Chek Softclix Lancets lancets Check once a day    acetaminophen 325 MG tablet Commonly known as: TYLENOL Take 2 tablets (650 mg total) by mouth every 6 (six) hours as needed for mild pain or moderate pain.   albuterol 108 (90 Base) MCG/ACT inhaler Commonly known as: ProAir HFA Inhale 1-2 puffs into the lungs every 6 (six) hours as needed for wheezing or shortness of breath.   allopurinol 100 MG tablet Commonly known as: ZYLOPRIM Take 1 tablet (100 mg total) by mouth daily.   aspirin EC 81 MG tablet Take 1 tablet (81 mg total) by mouth daily.   carvedilol 6.25 MG tablet Commonly known as: COREG Take 1 tablet (6.25 mg total) by mouth 2 (two) times daily before a meal. What changed:  medication strength how much to take when to take this   cefTRIAXone 2 g in sodium chloride 0.9 % 100 mL Inject 2 g into the vein daily for 8 days.   colchicine 0.6 MG tablet Take 1 tablet (0.6 mg total) by mouth daily. What changed:  how much to take how to take this when to take this additional instructions   feeding supplement Liqd Take 237 mLs by mouth 3 (three) times daily between meals.   gabapentin 100 MG capsule Commonly known as: NEURONTIN Take 2 capsules (200 mg  total) by mouth 3 (three) times daily. What changed:  medication strength how much to take   glipiZIDE 10 MG tablet Commonly known as: GLUCOTROL TAKE 1 TABLET BY MOUTH  TWICE A DAY BEFORE A MEAL What changed:  how much to take how to take this when to take this additional instructions   Insulin Pen Needle 32G X 4 MM Misc Use to inject victoza one time a day   Jardiance 10 MG Tabs tablet Generic drug: empagliflozin Take 10 mg by mouth every morning.   melatonin 3 MG Tabs tablet Take 2 tablets (6 mg total) by mouth at bedtime.   metroNIDAZOLE 500 MG tablet Commonly known as: FLAGYL Take 1 tablet (500 mg total) by mouth every 12 (twelve) hours for 8 days.   multivitamin with minerals Tabs tablet Take 1 tablet by mouth daily. Start taking on: March 26, 2021   nitroGLYCERIN 0.4 MG SL tablet Commonly known as: NITROSTAT DISSOLVE 1 TABLET UNDER THE TONGUE EVERY 5 MINUTES AS  NEEDED FOR CHEST PAIN , MAX 3 TABLETS IN 15 MINUTES,  CALL 911 IF NO RELIEF. What changed:  how much to take how to take this when to take this reasons to take this additional instructions   pantoprazole 40 MG tablet Commonly known as: PROTONIX Take 1 tablet (40 mg total) by mouth 2 (two) times daily.   polyethylene glycol 17 g packet Commonly known as: MIRALAX / GLYCOLAX Take 17 g by mouth 2 (two) times daily.   rosuvastatin 5 MG tablet Commonly known as: CRESTOR Take 5 mg by mouth at bedtime. What changed: Another medication with the same name was removed. Continue taking this medication, and follow the directions you see here.   tamsulosin 0.4 MG Caps capsule Commonly known as: Flomax Take 1 capsule (0.4 mg total) by mouth daily. What changed: when to take this   traMADol 50 MG tablet Commonly known as: ULTRAM Take 50 mg by mouth daily as needed.   Victoza 18 MG/3ML Sopn Generic drug: liraglutide Inject 1.8 mg into the skin daily as instructed What changed:  how much to take how to take this when to take this additional instructions        Consultations: General surgery Infectious disease Interventional radiology  Procedures/Studies: 03/05/2021-diagnostic laparoscopy, lysis of single addition band in LLQ with resolution of SBO, mini laparotomy with primary repair of 2 mm small bowel perforation by Dr. Montey Hora Abd 1 View  Result Date: 03/21/2021 CLINICAL DATA:  Abdominal pain, constipation EXAM: ABDOMEN - 1 VIEW COMPARISON:  CT 03/19/2021 FINDINGS: Nonobstructive bowel gas pattern. There is a moderate to large colonic stool burden. No radiopaque calculi over the kidneys or course of the ureters. No acute osseous abnormality. Multilevel degenerative changes of the spine. Bilateral hip degenerative changes, right worse than left.  IMPRESSION: No evidence of bowel obstruction. Moderate to large colonic stool burden. Electronically Signed   By: Maurine Simmering M.D.   On: 03/21/2021 12:08   DG Abd 1 View  Result Date: 03/10/2021 CLINICAL DATA:  Feeding tube placement EXAM: ABDOMEN - 1 VIEW COMPARISON:  03/05/2021 FINDINGS: Large-bore esophagogastric tube is positioned with tip below the diaphragm, in the gastric fundus, and side port near the level of the gastroesophageal junction. Recommend advancement to ensure subdiaphragmatic positioning. There is no enteric feeding tube identified in the lower chest or abdomen per reported exam indication. Gas-filled, nondistended loops of small bowel and colon in the included upper abdomen. IMPRESSION: 1. Large-bore esophagogastric  tube is positioned with tip below the diaphragm, in the gastric fundus, and side port near the level of the gastroesophageal junction. Recommend advancement to ensure subdiaphragmatic positioning of tip and side port. 2. No enteric feeding tube identified in the lower chest or abdomen per reported exam indication. Electronically Signed   By: Delanna Ahmadi M.D.   On: 03/10/2021 12:20   DG Abd 1 View  Result Date: 03/04/2021 CLINICAL DATA:  Small-bowel obstruction, vomiting EXAM: ABDOMEN - 1 VIEW COMPARISON:  03/04/2021, 10:25 a.m. FINDINGS: Interval administration of enteric contrast, which is present in the gastric fundus and lower esophagus. There is dilute contrast present in distended loops of mid small bowel measuring up to 5.1 cm in caliber. There is a moderate burden of gas and stool throughout the colon to the rectum. No obvious free air. Esophagogastric tube remains with tip and side port below the diaphragm. IMPRESSION: 1. Interval administration of enteric contrast, which is present in the gastric fundus and lower esophagus. There is dilute contrast present in distended loops of mid small bowel measuring up to 5.1 cm in caliber. Findings remain consistent with  small bowel obstruction. 2. There is a moderate burden of gas and stool throughout the colon to the rectum. Electronically Signed   By: Delanna Ahmadi M.D.   On: 03/04/2021 13:50   CT ABDOMEN PELVIS W CONTRAST  Result Date: 03/19/2021 CLINICAL DATA:  Periumbilical pain, recent surgery for small bowel obstruction EXAM: CT ABDOMEN AND PELVIS WITH CONTRAST TECHNIQUE: Multidetector CT imaging of the abdomen and pelvis was performed using the standard protocol following bolus administration of intravenous contrast. CONTRAST:  168mL OMNIPAQUE IOHEXOL 300 MG/ML  SOLN COMPARISON:  Previous studies including the examination of 03/10/2021 FINDINGS: Lower chest: There are subpleural blebs and bullae in the lower lung fields. There are linear densities with air bronchogram in right middle lobe and both lower lobes with slight improvement. Hepatobiliary: No focal abnormality is seen in the the liver. Gallbladder is unremarkable. There is no dilation of bile ducts. Pancreas: No focal abnormality is seen. Spleen: Unremarkable. Adrenals/Urinary Tract: Adrenals are not enlarged. There is no hydronephrosis. There are no renal or ureteral stones. Urinary bladder is unremarkable. Stomach/Bowel: Stomach is not distended. There is no significant small bowel dilation. Appendix is not dilated. There is no significant wall thickening in colon. Scattered diverticula are seen in colon without signs of focal diverticulitis. There is 2.8 x 2.4 cm loculated fluid collection anterior to the rectum slightly to the right of midline in the pelvis. There is interval decrease in size since 03/10/2021. Vascular/Lymphatic: Arterial calcifications are seen. No new significant lymphadenopathy seen. Reproductive: Unremarkable. Other: There is no ascites or pneumoperitoneum. Skin staples are seen in the anterior abdominal wall. There is small ventral hernia containing fat in the epigastrium. There is interval appearance of 2.4 cm smooth marginated  structure in the subcutaneous plane with fat fluid level. There is another loculated fluid collection measuring 3.3 cm in diameter in the anterior abdominal wall immediately superior to the umbilicus. There are no pockets of air within these fluid collections. There are foci of subcutaneous stranding in the anterior abdominal wall, possibly related to parenteral administration of medications. Musculoskeletal: Degenerative changes are noted in the lumbar spine with no significant interval change. IMPRESSION: There is interval decrease in size of small loculated fluid collection in the pelvis anterior to the rectum. This fluid collection measures 2.8 cm in maximum diameter in the current study. No new loculated intra-abdominal fluid collections  are seen. There is no evidence of intestinal obstruction or pneumoperitoneum. There is no hydronephrosis. There is new 3.3 cm loculated fluid collection in the subcutaneous plane in the anterior abdominal wall immediately superior to the umbilicus and posterior to the skin staples. This may suggest seroma or an abscess. There are no pockets of air in this fluid collection. There is another smaller loculated fluid collection with fat fluid level in the epigastrium adjacent to a ventral hernia. Possibility of fat necrosis in this region is not excluded. There are patchy linear infiltrates in both lower lung fields suggesting subsegmental atelectasis with slight improvement. Electronically Signed   By: Elmer Picker M.D.   On: 03/19/2021 13:05   CT ABDOMEN PELVIS W CONTRAST  Result Date: 03/10/2021 CLINICAL DATA:  Abdominal distension with increased pain. Five days status post exploratory laparotomy and small bowel perforation. EXAM: CT ABDOMEN AND PELVIS WITH CONTRAST TECHNIQUE: Multidetector CT imaging of the abdomen and pelvis was performed using the standard protocol following bolus administration of intravenous contrast. CONTRAST:  160mL OMNIPAQUE IOHEXOL 300 MG/ML   SOLN COMPARISON:  CT abdomen and pelvis 03/04/2021. FINDINGS: Lower chest: There are new bands of atelectasis/airspace disease in the bilateral lower lobes. Mild emphysematous changes are present. Hepatobiliary: Hyperdensity in the gallbladder is new from prior, likely excreted contrast. No radiopaque gallstones or bile duct dilatation. No focal liver lesions are identified. Pancreas: Unremarkable. No pancreatic ductal dilatation or surrounding inflammatory changes. Spleen: Normal in size without focal abnormality. Adrenals/Urinary Tract: Adrenal glands are unremarkable. Kidneys are normal, without renal calculi, focal lesion, or hydronephrosis. Bladder is unremarkable. Stomach/Bowel: There is a moderate length segment of small bowel in the right abdomen demonstrating circumferential wall thickening/edema with mesenteric edema. There is no pneumatosis. Small bowel loops proximal to this level are dilated measuring up to 3.8 cm. Small bowel loops distal to this level are nondilated. Oral contrast reaches the distal ileum. Colon and appendix are within normal limits. Nasogastric tube tip is in the body of the stomach. The stomach is otherwise within normal limits. There is no free air. Vascular/Lymphatic: Aortic atherosclerosis. No enlarged abdominal or pelvic lymph nodes. Reproductive: Prostate is unremarkable. Other: There is a new peripherally enhancing fluid collection within the pelvis anterior to the rectum measuring 3.0 x 3.1 x 4.2 cm. There is no focal abdominal wall hernia. Midline skin staples are present. Injection sites noted in the anterior abdominal wall. Musculoskeletal: Stable mixed lucencies with coarsened trabeculae in the T12 and L1 vertebral bodies. IMPRESSION: 1. Moderate length segment small bowel in the right abdomen demonstrating wall thickening and mesenteric edema worrisome for nonspecific enteritis including infectious, inflammatory and ischemic etiologies. Small bowel loops proximal to this  level are dilated, but oral contrast is seen distal to this level. Findings are compatible with partial small bowel obstruction. 2. New enhancing fluid collection in the pelvis may represent abscess. 3. Nasogastric tube tip in the mid stomach. 4. New bilateral lower lobe atelectasis/airspace disease. 5. These results were called by telephone at the time of interpretation on 03/10/2021 at 8:33 pm to provider NP Gershon Cull, who verbally acknowledged these results. Electronically Signed   By: Ronney Asters M.D.   On: 03/10/2021 20:34   CT ABDOMEN PELVIS W CONTRAST  Result Date: 03/04/2021 CLINICAL DATA:  Abdominal pain. EXAM: CT ABDOMEN AND PELVIS WITH CONTRAST TECHNIQUE: Multidetector CT imaging of the abdomen and pelvis was performed using the standard protocol following bolus administration of intravenous contrast. CONTRAST:  139mL OMNIPAQUE IOHEXOL 300 MG/ML  SOLN COMPARISON:  None. FINDINGS: Lower chest: No acute abnormality. Hepatobiliary: There is diffuse fatty infiltration of the liver parenchyma. No focal liver abnormality is seen. No gallstones, gallbladder wall thickening, or biliary dilatation. Pancreas: Unremarkable. No pancreatic ductal dilatation or surrounding inflammatory changes. Spleen: Normal in size without focal abnormality. Adrenals/Urinary Tract: Adrenal glands are unremarkable. Kidneys are normal, without renal calculi, focal lesion, or hydronephrosis. Bladder is unremarkable. Stomach/Bowel: There is a small hiatal hernia. Appendix appears normal. Multiple dilated small bowel loops are seen within the mid and lower left abdomen. A transition zone is suspected within the pelvis on the left (axial CT images 57 through 62, CT series 3). Vascular/Lymphatic: Aortic atherosclerosis. No enlarged abdominal or pelvic lymph nodes. Reproductive: Prostate is unremarkable. Other: No abdominal wall hernia or abnormality. No abdominopelvic ascites. Musculoskeletal: No acute or significant osseous  findings. IMPRESSION: 1. Small-bowel obstruction with a transition zone noted within the pelvis on the left. 2. Hepatic steatosis. 3. Aortic atherosclerosis. Aortic Atherosclerosis (ICD10-I70.0). Electronically Signed   By: Virgina Norfolk M.D.   On: 03/04/2021 04:04   DG Abd Portable 1V  Result Date: 03/05/2021 CLINICAL DATA:  Nasogastric tube placement EXAM: PORTABLE ABDOMEN - 1 VIEW COMPARISON:  Yesterday FINDINGS: Shorter enteric tube with tip at the stomach and side-port over the lower esophagus. For the side port to reach the stomach would require approximately 7 cm of advancement. Partial coverage of the abdomen shows continued small bowel distension centrally. Clear lung bases. IMPRESSION: Shorter enteric tube with tip at the stomach and side port at the lower esophagus. Electronically Signed   By: Jorje Guild M.D.   On: 03/05/2021 05:53   DG Abd Portable 1V-Small Bowel Obstruction Protocol-initial, 8 hr delay  Result Date: 03/04/2021 CLINICAL DATA:  Small bowel obstruction. EXAM: PORTABLE ABDOMEN - 1 VIEW COMPARISON:  Abdominal radiograph dated 03/04/2021. FINDINGS: Enteric tube with tip in the distal stomach. Persistent dilatation of small-bowel loops measuring up to 3.7 cm in caliber. There is a rounded lucency in the left lower abdomen which is only seen on 1 of the images and may be artifactual. Free air is less likely but not excluded. Lateral decubitus images recommended for better evaluation. Degenerative changes of the spine. No acute osseous pathology. IMPRESSION: 1. Enteric tube with tip in the distal stomach. 2. Persistent dilatation of small-bowel loops. 3. Artifact versus less likely free air. Further evaluation with lateral decubitus radiographs recommended. Electronically Signed   By: Anner Crete M.D.   On: 03/04/2021 22:14   DG Abd Portable 1V  Result Date: 03/04/2021 CLINICAL DATA:  Nasogastric tube placement. EXAM: PORTABLE ABDOMEN - 1 VIEW COMPARISON:  March 04, 2021 (1:35 p.m.) FINDINGS: A nasogastric tube is seen with its distal tip overlying the region near the gastric antrum. Multiple dilated small bowel loops are again noted. No radio-opaque calculi or other significant radiographic abnormality are seen. IMPRESSION: Nasogastric tube positioning, as described above. Electronically Signed   By: Virgina Norfolk M.D.   On: 03/04/2021 19:52   DG Abd Portable 1V-Small Bowel Protocol-Position Verification  Result Date: 03/04/2021 CLINICAL DATA:  NG tube placement EXAM: PORTABLE ABDOMEN - 1 VIEW COMPARISON:  Same day CT. FINDINGS: There is a nasogastric tube with tip and side port overlying the stomach. Multiple dilated loops of small bowel in the upper abdomen. IMPRESSION: Nasogastric tube tip and side port overlie the stomach. Small bowel obstruction. Electronically Signed   By: Maurine Simmering M.D.   On: 03/04/2021 10:31   [place] EKG  SITE RITE  Result Date: 03/11/2021 If Site Rite image not attached, placement could not be confirmed due to current cardiac rhythm.      The results of significant diagnostics from this hospitalization (including imaging, microbiology, ancillary and laboratory) are listed below for reference.     Microbiology: No results found for this or any previous visit (from the past 240 hour(s)).   Labs:  CBC: Recent Labs  Lab 03/19/21 0414 03/20/21 0338 03/21/21 0317 03/22/21 0346 03/23/21 0314  WBC 9.0 7.6 7.3 6.5 6.0  NEUTROABS 5.2 4.9 3.5 3.0 3.8  HGB 10.9* 10.5* 10.9* 10.9* 10.4*  HCT 34.4* 32.9* 35.1* 35.5* 33.6*  MCV 69.9* 69.0* 70.5* 70.7* 70.1*  PLT 454* 429* 474* 478* 440*   BMP &GFR Recent Labs  Lab 03/19/21 0414  NA 132*  K 4.6  CL 103  CO2 23  GLUCOSE 170*  BUN 23  CREATININE 0.86  CALCIUM 9.0  PHOS 3.7   Estimated Creatinine Clearance: 92.5 mL/min (by C-G formula based on SCr of 0.86 mg/dL). Liver & Pancreas: Recent Labs  Lab 03/19/21 0414  ALBUMIN 2.1*   No results for input(s):  LIPASE, AMYLASE in the last 168 hours. No results for input(s): AMMONIA in the last 168 hours. Diabetic: No results for input(s): HGBA1C in the last 72 hours. Recent Labs  Lab 03/24/21 1123 03/24/21 1606 03/24/21 1736 03/24/21 2057 03/25/21 0743  GLUCAP 137* 123* 106* 125* 156*   Cardiac Enzymes: No results for input(s): CKTOTAL, CKMB, CKMBINDEX, TROPONINI in the last 168 hours. No results for input(s): PROBNP in the last 8760 hours. Coagulation Profile: No results for input(s): INR, PROTIME in the last 168 hours. Thyroid Function Tests: No results for input(s): TSH, T4TOTAL, FREET4, T3FREE, THYROIDAB in the last 72 hours. Lipid Profile: No results for input(s): CHOL, HDL, LDLCALC, TRIG, CHOLHDL, LDLDIRECT in the last 72 hours. Anemia Panel: No results for input(s): VITAMINB12, FOLATE, FERRITIN, TIBC, IRON, RETICCTPCT in the last 72 hours. Urine analysis:    Component Value Date/Time   COLORURINE YELLOW 03/04/2021 0300   APPEARANCEUR CLEAR 03/04/2021 0300   APPEARANCEUR Clear 01/12/2016 1634   LABSPEC 1.020 03/04/2021 0300   PHURINE 5.0 03/04/2021 0300   GLUCOSEU >=500 (A) 03/04/2021 0300   HGBUR NEGATIVE 03/04/2021 0300   BILIRUBINUR NEGATIVE 03/04/2021 0300   BILIRUBINUR negative 12/06/2018 1659   BILIRUBINUR Negative 01/12/2016 1634   KETONESUR 20 (A) 03/04/2021 0300   PROTEINUR NEGATIVE 03/04/2021 0300   UROBILINOGEN 2.0 (A) 12/06/2018 1659   UROBILINOGEN 1 10/23/2013 1555   NITRITE NEGATIVE 03/04/2021 0300   LEUKOCYTESUR NEGATIVE 03/04/2021 0300   Sepsis Labs: Invalid input(s): PROCALCITONIN, LACTICIDVEN   Time coordinating discharge: 55 minutes  SIGNED:  Mercy Riding, MD  [facility] 03/25/2021, 10:41 AM

## 2021-03-26 DIAGNOSIS — R5381 Other malaise: Secondary | ICD-10-CM | POA: Diagnosis not present

## 2021-03-26 LAB — CBC WITH DIFFERENTIAL/PLATELET
Abs Immature Granulocytes: 0.02 10*3/uL (ref 0.00–0.07)
Basophils Absolute: 0.1 10*3/uL (ref 0.0–0.1)
Basophils Relative: 1 %
Eosinophils Absolute: 0.4 10*3/uL (ref 0.0–0.5)
Eosinophils Relative: 6 %
HCT: 37.2 % — ABNORMAL LOW (ref 39.0–52.0)
Hemoglobin: 11.9 g/dL — ABNORMAL LOW (ref 13.0–17.0)
Immature Granulocytes: 0 %
Lymphocytes Relative: 32 %
Lymphs Abs: 2 10*3/uL (ref 0.7–4.0)
MCH: 22 pg — ABNORMAL LOW (ref 26.0–34.0)
MCHC: 32 g/dL (ref 30.0–36.0)
MCV: 68.8 fL — ABNORMAL LOW (ref 80.0–100.0)
Monocytes Absolute: 1 10*3/uL (ref 0.1–1.0)
Monocytes Relative: 16 %
Neutro Abs: 2.6 10*3/uL (ref 1.7–7.7)
Neutrophils Relative %: 45 %
Platelets: 367 10*3/uL (ref 150–400)
RBC: 5.41 MIL/uL (ref 4.22–5.81)
RDW: 14.7 % (ref 11.5–15.5)
WBC: 6 10*3/uL (ref 4.0–10.5)
nRBC: 0 % (ref 0.0–0.2)

## 2021-03-26 LAB — COMPREHENSIVE METABOLIC PANEL
ALT: 38 U/L (ref 0–44)
AST: 41 U/L (ref 15–41)
Albumin: 2.5 g/dL — ABNORMAL LOW (ref 3.5–5.0)
Alkaline Phosphatase: 136 U/L — ABNORMAL HIGH (ref 38–126)
Anion gap: 8 (ref 5–15)
BUN: 18 mg/dL (ref 8–23)
CO2: 25 mmol/L (ref 22–32)
Calcium: 9.6 mg/dL (ref 8.9–10.3)
Chloride: 101 mmol/L (ref 98–111)
Creatinine, Ser: 1.08 mg/dL (ref 0.61–1.24)
GFR, Estimated: >60 mL/min (ref >60)
Glucose, Bld: 110 mg/dL — ABNORMAL HIGH (ref 70–99)
Potassium: 5.4 mmol/L — ABNORMAL HIGH (ref 3.5–5.1)
Sodium: 134 mmol/L — ABNORMAL LOW (ref 135–145)
Total Bilirubin: 0.9 mg/dL (ref 0.3–1.2)
Total Protein: 7.9 g/dL (ref 6.5–8.1)

## 2021-03-26 LAB — GLUCOSE, CAPILLARY
Glucose-Capillary: 107 mg/dL — ABNORMAL HIGH (ref 70–99)
Glucose-Capillary: 133 mg/dL — ABNORMAL HIGH (ref 70–99)
Glucose-Capillary: 126 mg/dL — ABNORMAL HIGH (ref 70–99)
Glucose-Capillary: 107 mg/dL — ABNORMAL HIGH (ref 70–99)

## 2021-03-26 MED ORDER — CHLORHEXIDINE GLUCONATE CLOTH 2 % EX PADS
6.0000 | MEDICATED_PAD | Freq: Every day | CUTANEOUS | Status: DC
  Administered 2021-03-26: 6 via TOPICAL

## 2021-03-26 MED ORDER — CHLORHEXIDINE GLUCONATE CLOTH 2 % EX PADS: 6.0000 | MEDICATED_PAD | Freq: Two times a day (BID) | CUTANEOUS | Status: DC

## 2021-03-26 MED ORDER — SODIUM POLYSTYRENE SULFONATE 15 GM/60ML PO SUSP
15.0000 g | Freq: Once | ORAL | Status: AC
  Administered 2021-03-26: 15 g via ORAL
  Filled 2021-03-26: qty 60

## 2021-03-26 MED ORDER — CHLORHEXIDINE GLUCONATE CLOTH 2 % EX PADS
6.0000 | MEDICATED_PAD | Freq: Every day | CUTANEOUS | Status: DC
  Administered 2021-03-27 – 2021-04-06 (×9): 6 via TOPICAL

## 2021-03-26 NOTE — IPOC Note (Signed)
Overall Plan of Care [facility]) Patient Details Name: Daniel Perkins MRN: [id number] DOB: [birth year]  Admitting Diagnosis: Debility  Hospital Problems: Principal Problem:   Debility Active Problems:   SBO (small bowel obstruction) (HCC)     Functional Problem List: Nursing Skin Integrity, Safety, Nutrition, Medication Management, Endurance, Edema, Pain  PT Balance, Endurance, Motor, Nutrition, Pain, Skin Integrity  OT Balance, Endurance, Motor, Pain, Safety  SLP    TR         Basic ADL's: OT Bathing, Dressing, Toileting, Grooming     Advanced  ADL's: OT Simple Meal Preparation, Light Housekeeping, Laundry     Transfers: PT Bed Mobility, Bed to Chair, Set designer, Occupational psychologist, Research scientist (life sciences): PT Ambulation, Psychologist, prison and probation services, Stairs     Additional Impairments: OT    SLP        TR      Anticipated Outcomes Item Anticipated Outcome  Self Feeding Mod I  Swallowing      Basic self-care  Mod I  Toileting  Mod I   Bathroom Transfers Mod I-Supervision  Bowel/Bladder  n/a  Transfers  Mod I  Locomotion  Mod I  Communication     Cognition     Pain  <3  Safety/Judgment  Mod I and no falls   Therapy Plan: PT Intensity: Minimum of 1-2 x/day ,45 to 90 minutes PT Frequency: 5 out of 7 days PT Duration Estimated Length of Stay: 7-10 days OT Intensity: Minimum of 1-2 x/day, 45 to 90 minutes OT Frequency: 5 out of 7 days OT Duration/Estimated Length of Stay: 7-10 days     Due to the current state of emergency, patients may not be receiving their 3-hours of Medicare-mandated therapy.   Team Interventions: Nursing Interventions Patient/Family Education, Disease Management/Prevention, Pain Management, Medication Management, Skin Care/Wound Management, Discharge Planning  PT interventions Ambulation/gait training, Discharge planning, Functional mobility training, Therapeutic Activities, Balance/vestibular training, Disease management/prevention,  Neuromuscular re-education, Skin care/wound management, Therapeutic Exercise, Wheelchair propulsion/positioning, DME/adaptive equipment instruction, Pain management, Splinting/orthotics, UE/LE Strength taining/ROM, Community reintegration, Equities trader education, Museum/gallery curator, UE/LE Coordination activities  OT Interventions Warden/ranger, Discharge planning, Pain management, Self Care/advanced ADL retraining, Therapeutic Activities, UE/LE Coordination activities, Patient/family education, Therapeutic Exercise, Community reintegration, Fish farm manager, Psychosocial support, UE/LE Strength taining/ROM, Wheelchair propulsion/positioning  SLP Interventions    TR Interventions    SW/CM Interventions Discharge Planning, Psychosocial Support, Patient/Family Education, Disease Management/Prevention   Barriers to Discharge MD  Medical stability  Nursing Decreased caregiver support, Wound Care, Weight, Medication compliance, Nutrition means Lives alone in 1 level home with ramped entrance. Daughter and sister will be able to provide intermittent assist at discharge.  PT Decreased caregiver support, Wound Care, Lack of/limited family support lives alone, limited support  OT Wound Care, Lack of/limited family support    SLP      SW Insurance for SNF coverage, Wound Care     Team Discharge Planning: Destination: PT-Home ,OT- Home , SLP-  Projected Follow-up: PT-Home health PT, OT-  Other (comment) (intermittent supervision), SLP-  Projected Equipment Needs: PT-To be determined, OT- 3 in 1 bedside comode, Tub/shower bench, To be determined, SLP-  Equipment Details: PT- , OT-Reports having shower chair, but might would benefit from TTB, and would need BSC Patient/family involved in discharge planning: PT- Patient,  OT-Patient, SLP-   MD ELOS: 5-7 days modI Medical Rehab Prognosis:  Excellent Assessment: Daniel Perkins is a 65 year old man who is admitted to CIR  with debility secondary  to SBO with bowel perforation s/p NGT and TPN and persistent ileus and constipation on IV ABX for pelvic abscess. Medications are being managed, and labs and vitals are being monitored regularly.      See Team Conference Notes for weekly updates to the plan of care

## 2021-03-26 NOTE — Progress Notes (Signed)
PROGRESS NOTE   Subjective/Complaints: He has no complaints today Eating lunch Discussed elevated potassium and one dose of kayexalate today  ROS: denies pain   Objective:   No results found. Recent Labs    03/26/21 0608  WBC 6.0  HGB 11.9*  HCT 37.2*  PLT 367   Recent Labs    03/26/21 0608  NA 134*  K 5.4*  CL 101  CO2 25  GLUCOSE 110*  BUN 18  CREATININE 1.08  CALCIUM 9.6    Intake/Output Summary (Last 24 hours) at 03/26/2021 1855 Last data filed at 03/26/2021 1500 Gross per 24 hour  Intake 490 ml  Output 375 ml  Net 115 ml        Physical Exam: Vital Signs Blood pressure 110/74, pulse 82, temperature 98.3 F (36.8 C), temperature source Oral, resp. rate 18, height 5\' 7"  (1.702 m), weight 92.1 kg, SpO2 97 %. Gen: no distress, normal appearing HEENT: oral mucosa pink and moist, NCAT Cardio: Reg rate Chest: normal effort, normal rate of breathing Abdominal:     General: Bowel sounds are decreased. There is no abdominal bruit.     Tenderness: There is no abdominal tenderness.     Comments: Midline wound with packing as well as sutured penrose drain.  2.9x2.2 and 2.3 cm depth- packed when seen- minimal drainage.  Soft, NT, somewhat distended- hypoactive BS  Musculoskeletal:     Cervical back: Normal range of motion. No rigidity.     Comments: 5-/5  in UE B/L 5-/5 except DF/PF are 4/5 with significant reduction in ROM of ankles L>>R- due to previous clubfoot surgeries as child- 17 on L foot/ankle.   Skin:    General: Skin is warm and dry.  Neurological:     Mental Status: He is alert and oriented to person, place, and time.     Comments: Intact to light touch in all 4 extremities Ox3  Psychiatric:        Mood and Affect: Mood normal.        Behavior: Behavior normal   Assessment/Plan: 1. Functional deficits which require 3+ hours per day of interdisciplinary therapy in a comprehensive  inpatient rehab setting. Physiatrist is providing close team supervision and 24 hour management of active medical problems listed below. Physiatrist and rehab team continue to assess barriers to discharge/monitor patient progress toward functional and medical goals  Care Tool:  Bathing    Body parts bathed by patient: Right arm, Left arm, Chest, Right upper leg, Left upper leg, Face   Body parts bathed by helper: Front perineal area, Buttocks     Bathing assist Assist Level: Minimal Assistance - Patient > 75%     Upper Body Dressing/Undressing Upper body dressing   What is the patient wearing?: Pull over shirt    Upper body assist Assist Level: Supervision/Verbal cueing    Lower Body Dressing/Undressing Lower body dressing      What is the patient wearing?: Pants     Lower body assist Assist for lower body dressing: Minimal Assistance - Patient > 75%     Toileting Toileting    Toileting assist Assist for toileting: Moderate Assistance - Patient 50 -  74%     Transfers Chair/bed transfer  Transfers assist     Chair/bed transfer assist level: Minimal Assistance - Patient > 75%     Locomotion Ambulation   Ambulation assist      Assist level: Moderate Assistance - Patient 50 - 74% Assistive device: Other (comment) (handrail and HHA) Max distance: 18ft   Walk 10 feet activity   Assist     Assist level: Moderate Assistance - Patient - 50 - 74% Assistive device: Other (comment) (handrail and HHA)   Walk 50 feet activity   Assist    Assist level: Moderate Assistance - Patient - 50 - 74% Assistive device: Other (comment) (handrail and HHA)    Walk 150 feet activity   Assist Walk 150 feet activity did not occur: Safety/medical concerns (fatigue, weakness, decreased balance)         Walk 10 feet on uneven surface  activity   Assist Walk 10 feet on uneven surfaces activity did not occur: Safety/medical concerns (fatigue, weakness, decreased  balance)         Wheelchair     Assist Is the patient using a wheelchair?: Yes Type of Wheelchair: Manual    Wheelchair assist level: Dependent - Patient 0% Max wheelchair distance: >131ft    Wheelchair 50 feet with 2 turns activity    Assist        Assist Level: Dependent - Patient 0%   Wheelchair 150 feet activity     Assist      Assist Level: Dependent - Patient 0%   Blood pressure 110/74, pulse 82, temperature 98.3 F (36.8 C), temperature source Oral, resp. rate 18, height 5\' 7"  (1.702 m), weight 92.1 kg, SpO2 97 %.  Medical Problem List and Plan: 1.  Debility secondary to SBO with bowel perforation s/p NGT and TPN and persistent ileus and constipation on IV ABX for pelvic abscess             -patient may  shower if cover PICC and abd wound/incision             -ELOS/Goals: 5-7 days mod I  Continue CIR 2.  Impaired mobility: continue Lovenox             -antiplatelet therapy: N/A 3. Headaches/Chronic neck pain/Pain Management: Takes gabapentin prn --continue oxycodone prn              --has had right sided HA for last few days.  4. Mood: LCSW to follow for evaluation and support.              -antipsychotic agents: N/a 5. Neuropsych: This patient is capable of making decisions on his own behalf. 6. Skin/Wound Care: Routine  pressure  relief measures 7. Fluids/Electrolytes/Nutrition: Monitor I/O. Check lytes in am.  8. SBO with ileus s/p lysis of adhesions: Continue wound care bid with bid wet to dry dressing changes - moistened plain guaze packing strips or 2 inch kerlix             --has not had BM for 5 days. Will schedule miralax. Also, will give Sorbitol tonight to see if can get him to go.   9. Abdominal wall seroma: Penrose drain placed 11/17 and to be removed in 2 weeks in clinic. 10. Wound infection: On Cefotetan, Rocephin and Flagyl thorough 11/28             -- Repeat CT abdomen pelvis 11/24 and to consult ID for follow-up on antibiotic  regimen. 11. HTN:  Monitor BP TID 12. T2DM: Hgb a1c- 6.0 and well controlled. Will monitor BS ac/hs and use SSI for elevated BS. Continue glargine insulin. --Used victoza 1.8, glucotrol and Jardiance. 13. Gout: Stable on colchicine.  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  15. Hyperkalemia: 1 dose kayexalate 11/18 16. Hyponatremia: repeat Na on Monday  LOS: 1 days A FACE TO FACE EVALUATION WAS PERFORMED  Drema Pry Areyanna Figeroa 03/26/2021, 6:55 PM

## 2021-03-26 NOTE — Evaluation (Addendum)
Occupational Therapy Assessment and Plan  Patient Details  Name: Daniel Perkins MRN: [id number] Date of Birth: Oct 23, 1955  OT Diagnosis: abnormal posture, acute pain, muscle weakness (generalized), and pain in joint Rehab Potential: Rehab Potential (ACUTE ONLY): Good ELOS: 7-10 days   Today's Date: 03/26/2021 OT Individual Time: 3335-4562 OT Individual Time Calculation (min): 55 min     Hospital Problem: Principal Problem:   Debility Active Problems:   SBO (small bowel obstruction) ([place])   Past Medical History:  Past Medical History:  Diagnosis Date   Club foot of both lower extremities 1958   Coronary artery disease    Holter monitor 02/2012 - sinus with rare PVC   CTEV (congenital talipes equinovarus)    Gout    Hyperlipidemia    Hypertension    Myocardial infarction ([place]) ~ 2007   "mild"   Osteomyelitis ([place])    Pneumonia 2000's   "couple times"   Pneumonia due to COVID-19 virus 04/28/2019   Type II diabetes mellitus ([place]) 2011   Past Surgical History:  Past Surgical History:  Procedure Laterality Date   ANTERIOR CERVICAL DECOMP/DISCECTOMY FUSION  X 2   CARDIAC CATHETERIZATION     FOOT FRACTURE SURGERY Right 1990's   "pt a steel plate in"   FOOT SURGERY Left x16   "joints collapsed; keep getting infected"   LAPAROSCOPY N/A 03/05/2021   Procedure: LAPAROSCOPY DIAGNOSTIC;  Surgeon: Clovis Riley, MD;  Location: [facility];  Service: General;  Laterality: N/A;   LAPAROTOMY Right 03/05/2021   Procedure: EXPLORATORY LAPAROTOMY, REPAIR SMALL BOWEL PERFORATION;  Surgeon: Clovis Riley, MD;  Location: [facility];  Service: General;  Laterality: Right;   LYSIS OF ADHESION  03/05/2021   Procedure: LYSIS OF ADHESION;  Surgeon: Clovis Riley, MD;  Location: [facility];  Service: General;;   POSTERIOR CERVICAL FUSION/FORAMINOTOMY  X 2    Assessment & Plan Clinical Impression:   Patient is a 65 y.o. year old male with history of CAD, gout, CKD II, T2DM who was admitted  on 03/04/21 with 24hour h/o N/V and abdominal pain. He was found to have SBO and NGT placed for decompression without good results. He was taken to OR on 10/28 for diagnostic laparotomy with lysis of single adhesive band and left lower quadrant with resolution of bowel obstruction and mini laparotomy with primary repair of 2 small bowel perforation by Dr. Windle Guard.  Postop abdominal pain was slowly improving with NG tube in place for persistent ileus.  He required TNA for nutritional supplement and follow-up CT abdomen showed thickened nonspecific small bowel with small pelvic fluid collection therefore Zosyn added 11/03.   Follow-up CT abdomen showed new 3.3 cm loculated fluid collection immediately superior to the umbilicus and posterior to skin staple question seroma or abscess and interval decrease in swallow.  Fluid collection in pelvis.     Dr. Jayme Cloud, ID was consulted for input and recommended changing Zosyn to ceftriaxone and metronidazole in setting of AKI.  IR was consulted for input on aspiration felt fluid collection and felt that there was no role for aspiration/drainage of fluid collection due to decrease in size.  Abdominal pain resolved ,he has slowly been advanced to soft diet and TNA discontinued as intake improved.  Staples were removed on 11/13 and Penrose drain was placed in open wound on 11/17 along incision line to prevent fluid collection.  He is to continue on IV ceftriaxone and metrazol for 2 to 3 weeks from 11/11 with recommendations repeat CT abdomen/pelvis  around 11/24 to follow-up on abscess or urgently as needed signs of sepsis.  ID to be consulted for follow-up after CT performed. Patient transferred to CIR on 03/25/2021 .    Patient currently requires min A with basic self-care skills secondary to muscle weakness and decreased cardiorespiratoy endurance.  Prior to hospitalization, patient could complete basic ADLs with independent .  Patient will benefit from skilled  intervention to decrease level of assist with basic self-care skills, increase independence with basic self-care skills, and increase level of independence with iADL prior to discharge home independently.  Anticipate patient will require intermittent supervision and no further OT follow recommended.  OT - End of Session Activity Tolerance: Tolerates 10 - 20 min activity with multiple rests Endurance Deficit: Yes Endurance Deficit Description: required rest breaks throughout session OT Assessment Rehab Potential (ACUTE ONLY): Good OT Barriers to Discharge: Wound Care;Lack of/limited family support OT Patient demonstrates impairments in the following area(s): Balance;Endurance;Motor;Pain;Safety OT Basic ADL's Functional Problem(s): Bathing;Dressing;Toileting;Grooming OT Advanced ADL's Functional Problem(s): Simple Meal Preparation;Light Housekeeping;Laundry OT Transfers Functional Problem(s): Toilet;Tub/Shower OT Plan OT Intensity: Minimum of 1-2 x/day, 45 to 90 minutes OT Frequency: 5 out of 7 days OT Duration/Estimated Length of Stay: 7-10 days OT Treatment/Interventions: Balance/vestibular training;Discharge planning;Pain management;Self Care/advanced ADL retraining;Therapeutic Activities;UE/LE Coordination activities;Patient/family education;Therapeutic Exercise;Community reintegration;DME/adaptive equipment instruction;Psychosocial support;UE/LE Strength taining/ROM;Wheelchair propulsion/positioning OT Self Feeding Anticipated Outcome(s): Mod I OT Basic Self-Care Anticipated Outcome(s): Mod I OT Toileting Anticipated Outcome(s): Mod I OT Bathroom Transfers Anticipated Outcome(s): Mod I-Supervision OT Recommendation Patient destination: Home Follow Up Recommendations: Other (comment) (intermittent supervision) Equipment Recommended: 3 in 1 bedside comode;Tub/shower bench;To be determined Equipment Details: Reports having shower chair, but might would benefit from TTB, and would need  [facility]   OT Evaluation Precautions/Restrictions  Precautions Precautions: Other (comment) Precaution Comments: abdominal precautions Restrictions Weight Bearing Restrictions: No Other Position/Activity Restrictions: no lifting >10lbs Home Living/Prior Functioning Home Living Living Arrangements: Alone Available Help at Discharge: Friend(s), Available PRN/intermittently Type of Home: House Home Access: Stairs to enter, Ramped entrance (ramp in front) Entrance Stairs-Number of Steps: 3 Entrance Stairs-Rails: None Home Layout: One level Bathroom Shower/Tub: Chiropodist: Standard Bathroom Accessibility: Yes Additional Comments: reports a friend would be able to provide intermittent assist  Lives With: Alone IADL History Homemaking Responsibilities: Yes Meal Prep Responsibility: Primary Laundry Responsibility: Primary Cleaning Responsibility: Primary Current License: Yes Mode of Transportation:  (reports not driving in over a year) Occupation: On disability 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 Prior Function Level of Independence: Independent with basic ADLs, Independent with homemaking with ambulation, Independent with transfers, Independent with gait  Able to Take Stairs?: Yes Driving: No (hasnt driven in a year) Vocation: On disability Leisure: Hobbies-yes (Comment) Vision Baseline Vision/History: 0 No visual deficits Ability to See in Adequate Light: 0 Adequate Patient Visual Report: No change from baseline Vision Assessment?: No apparent visual deficits Perception  Perception: Within Functional Limits Praxis Praxis: Intact Cognition Overall Cognitive Status: Within Functional Limits for tasks assessed Arousal/Alertness: Awake/alert Orientation Level: Person;Place;Situation Person: Oriented Place: Oriented Situation: Oriented Year: 2022 Month: November Day of Week: Correct Memory: Appears intact Immediate Memory Recall: Sock;Bed;Blue Memory  Recall Sock: Without Cue Memory Recall Blue: Without Cue Memory Recall Bed: With Cue Awareness: Appears intact Problem Solving: Appears intact Safety/Judgment: Appears intact Sensation Sensation Light Touch: Appears Intact Hot/Cold: Appears Intact Proprioception: Appears Intact Stereognosis: Appears Intact Coordination Gross Motor Movements are Fluid and Coordinated: No Fine Motor Movements are Fluid and Coordinated: Yes Coordination and Movement Description: grossly  uncoordinated due to weakness/deconditioning and decreased balance/postural control Finger Nose Finger Test: [facility] but decreased precision on L Heel Shin Test: [facility] bilaterally Motor  Motor Motor: Abnormal postural alignment and control Motor - Skilled Clinical Observations: grossly uncoordinated due to weakness/deconditioning and decreased balance/postural control  Trunk/Postural Assessment  Cervical Assessment Cervical Assessment: Exceptions to [facility] (forward head) Thoracic Assessment Thoracic Assessment: Exceptions to [facility] (mild kyphosis) Lumbar Assessment Lumbar Assessment: Exceptions to [facility] (posterior pelvic tilt) Postural Control Postural Control: Deficits on evaluation (very minimal posterior lean in standing)  Balance Balance Balance Assessed: Yes Static Sitting Balance Static Sitting - Balance Support: Feet supported;Bilateral upper extremity supported Static Sitting - Level of Assistance: 5: Stand by assistance (supervision) Dynamic Sitting Balance Dynamic Sitting - Balance Support: Feet supported;No upper extremity supported Dynamic Sitting - Level of Assistance: 5: Stand by assistance (supervision) Static Standing Balance Static Standing - Balance Support: No upper extremity supported Static Standing - Level of Assistance: 5: Stand by assistance (CGA) Dynamic Standing Balance Dynamic Standing - Balance Support: No upper extremity supported Dynamic Standing - Level of Assistance: 4: Min assist Dynamic  Standing - Comments: with transfers Extremity/Trunk Assessment RUE Assessment RUE Assessment: Within Functional Limits General Strength Comments: 4+/5 grossly LUE Assessment LUE Assessment: Within Functional Limits General Strength Comments: 4+/5 grossly  Care Tool Care Tool Self Care Eating   Eating Assist Level: Set up assist    Oral Care    Oral Care Assist Level: Set up assist    Bathing   Body parts bathed by patient: Right arm;Left arm;Chest;Right upper leg;Left upper leg;Face Body parts bathed by helper: Front perineal area;Buttocks   Assist Level: Minimal Assistance - Patient > 75%    Upper Body Dressing(including orthotics)   What is the patient wearing?: Pull over shirt   Assist Level: Supervision/Verbal cueing    Lower Body Dressing (excluding footwear)   What is the patient wearing?: Pants Assist for lower body dressing: Minimal Assistance - Patient > 75%    Putting on/Taking off footwear   What is the patient wearing?: Shoes Assist for footwear: [place] activity   Assist for toileting: Moderate Assistance - Patient 50 - 74%     Care Tool Bed Mobility Roll left and right activity   Roll left and right assist level: Minimal Assistance - Patient > 75%    Sit to lying activity        Lying to sitting on side of bed activity         Care Tool Transfers Sit to stand transfer   Sit to stand assist level: Minimal Assistance - Patient > 75%    Chair/bed transfer   Chair/bed transfer assist level: Minimal Assistance - Patient > 75%     Toilet transfer   Assist Level: Minimal Assistance - Patient > 75%     Care Tool Cognition  Expression of Ideas and Wants Expression of Ideas and Wants: 3. Some difficulty - exhibits some difficulty with expressing needs and ideas (e.g, some words or finishing thoughts) or speech is not clear  Understanding Verbal and Non-Verbal Content Understanding Verbal and  Non-Verbal Content: 3. Usually understands - understands most conversations, but misses some part/intent of message. Requires cues at times to understand   Memory/Recall Ability Memory/Recall Ability : Current season;That he or she is in a hospital/hospital unit   Refer to Care Plan for [place] 1 OT Short Term  Goal 1 (Week 1): STG = LTG due to ELOS  Recommendations for other services: None    Skilled Therapeutic Intervention Skilled OT intervention completed with focus on POC, rehab goals and discussion on purpose of OT. Pt received in long sitting in bed, agreeable to session. Able to complete bed mobility with CGA, sponge bathing/grooming at EOB with min A for LB washing, min A for LB dressing. Pt able to sit > stand for transfer and clothing management with min A without walker and stand pivot to recliner without walker with min A. Pt left seated in recliner, with chair alarm on, and all needs in reach at end of session.  ADL ADL Eating: Set up Where Assessed-Eating: Bed level Grooming: Setup Where Assessed-Grooming: Edge of bed Upper Body Bathing: Supervision/safety Where Assessed-Upper Body Bathing: Edge of bed Lower Body Bathing: Minimal assistance Where Assessed-Lower Body Bathing: Edge of bed Upper Body Dressing: Setup Where Assessed-Upper Body Dressing: Edge of bed Lower Body Dressing: Minimal assistance Where Assessed-Lower Body Dressing: Edge of bed Toileting: Minimal assistance Where Assessed-Toileting: Bedside Commode Toilet Transfer: Minimal assistance Toilet Transfer Method: Counselling psychologist: Radiographer, therapeutic: Not assessed Social research officer, government: Not assessed Mobility  Transfers Sit to Stand: Minimal Assistance - Patient > 75% Stand to Sit: Contact Guard/Touching assist   Discharge Criteria: Patient will be discharged from OT if patient refuses treatment 3 consecutive times without medical  reason, if treatment goals not met, if there is a change in medical status, if patient makes no progress towards goals or if patient is discharged from hospital.  The above assessment, treatment plan, treatment alternatives and goals were discussed and mutually agreed upon: by patient  [place] 03/26/2021, 12:18 PM

## 2021-03-26 NOTE — Progress Notes (Signed)
Inpatient Rehabilitation Care Coordinator Assessment and Plan Patient Details  Name: Daniel Perkins MRN: [id number] Date of Birth: May 27, 1955  Today's Date: 03/26/2021  Hospital Problems: Principal Problem:   Debility Active Problems:   SBO (small bowel obstruction) ([place])  Past Medical History:  Past Medical History:  Diagnosis Date   Club foot of both lower extremities 1958   Coronary artery disease    Holter monitor 02/2012 - sinus with rare PVC   CTEV (congenital talipes equinovarus)    Gout    Hyperlipidemia    Hypertension    Myocardial infarction ([place]) ~ 2007   "mild"   Osteomyelitis ([place])    Pneumonia 2000's   "couple times"   Pneumonia due to COVID-19 virus 04/28/2019   Type II diabetes mellitus ([place]) 2011   Past Surgical History:  Past Surgical History:  Procedure Laterality Date   ANTERIOR CERVICAL DECOMP/DISCECTOMY FUSION  X 2   CARDIAC CATHETERIZATION     FOOT FRACTURE SURGERY Right 1990's   "pt a steel plate in"   FOOT SURGERY Left x16   "joints collapsed; keep getting infected"   LAPAROSCOPY N/A 03/05/2021   Procedure: LAPAROSCOPY DIAGNOSTIC;  Surgeon: Clovis Riley, MD;  Location: [place];  Service: General;  Laterality: N/A;   LAPAROTOMY Right 03/05/2021   Procedure: EXPLORATORY LAPAROTOMY, REPAIR SMALL BOWEL PERFORATION;  Surgeon: Clovis Riley, MD;  Location: [facility];  Service: General;  Laterality: Right;   LYSIS OF ADHESION  03/05/2021   Procedure: LYSIS OF ADHESION;  Surgeon: Clovis Riley, MD;  Location: [facility];  Service: General;;   POSTERIOR CERVICAL FUSION/FORAMINOTOMY  X 2   Social History:  reports that he quit smoking about 13 years ago. His smoking use included cigarettes. He has a 32.00 pack-year smoking history. He has never used smokeless tobacco. He reports current alcohol use. He reports that he does not use drugs.  Family / Support Systems Children: Verlene Mayer (Daughter) Other Supports: Valaria Good  (sister) Anticipated Caregiver: assistance from daughter and sister (PRN) Ability/Limitations of Caregiver: intermittent Caregiver Availability: Intermittent Family Dynamics: support from daughter and sister  Social History Preferred language: English Religion: [place] - How often do you need to have someone help you when you read instructions, pamphlets, or other written material from your doctor or pharmacy?: Never Writes: Yes Legal History/Current Legal Issues: n/a Guardian/Conservator: Mozella   Abuse/Neglect Abuse/Neglect Assessment Can Be Completed: Yes Physical Abuse: Denies Verbal Abuse: Denies Sexual Abuse: Denies Exploitation of patient/patient's resources: Denies Self-Neglect: Denies  Patient response to: Social Isolation - How often do you feel lonely or isolated from those around you?: Never  Emotional Status Psychiatric History: n/a Substance Abuse History: n/a  Patient / Family Perceptions, Expectations & Goals Pt/Family understanding of illness & functional limitations: yes Premorbid pt/family roles/activities: Pt independent prior to admission Anticipated changes in roles/activities/participation: pt plans to reach MOD I goals Pt/family expectations/goals: MOD I  Recruitment consultant: None Premorbid Home Care/DME Agencies: None Transportation available at discharge: family able to transport Is the patient able to respond to transportation needs?: Yes In the past 12 months, has lack of transportation kept you from medical appointments or from getting medications?: No In the past 12 months, has lack of transportation kept you from meetings, work, or from getting things needed for daily living?: No Resource referrals recommended: Neuropsychology  Discharge Planning Living Arrangements: Alone Support Systems: Children, Other relatives Type of Residence: Private residence Insurance Resources: Multimedia programmer  (specify)  Financial Resources: Fish farm manager, SSD, Family Support Financial Screen Referred: No Living Expenses: Own Money Management: Patient Does the patient have any problems obtaining your medications?: No Home Management: Independent Patient/Family Preliminary Plans: Pt plans to reach MOD I goals Care Coordinator Barriers to Discharge: Insurance for SNF coverage, Wound Care Care Coordinator Anticipated Follow Up Needs: HH/OP Expected length of stay: 5-7 Days  Clinical Impression Sw met with patient, introduced self, explained role and addressed questions and concerns. Pt reports his family will be present this weekend, contact information left. No additional questions or concerns, sw will continue to follow up.  Dyanne Iha 03/26/2021, 1:30 PM

## 2021-03-26 NOTE — Progress Notes (Signed)
Inpatient Rehabilitation Admission Medication Review by a Pharmacist  A complete drug regimen review was completed for this patient to identify any potential clinically significant medication issues.  High Risk Drug Classes Is patient taking? Indication by Medication  Antipsychotic Yes Compazine for nausea  Anticoagulant Yes LMWH for VTE prophx  Antibiotic Yes Rocephin/Flagyl for intra-abdominal infection  Opioid Yes Oxy for HA, chronic neck pain, abdominal wound  Antiplatelet No   Hypoglycemics/insulin Yes SSI, Semglee for DM  Vasoactive Medication Yes Coreg for HTN, CAD  Chemotherapy No   Other No      Type of Medication Issue Identified Description of Issue Recommendation(s)  Drug Interaction(s) (clinically significant)     Duplicate Therapy     Allergy     No Medication Administration End Date  Rocephin, Flagyl Added 2 week stop date from the last CT scan.  Incorrect Dose     Additional Drug Therapy Needed     Significant med changes from prior encounter (inform family/care partners about these prior to discharge). Allopurinol, ASA81mg , Glipizide, Victoza, Flomax, Jardiance, Crestor Resume Glipizide, Vitoza, and Jardiance as CBGs dictate. Stable to resumed other home meds  Other       Clinically significant medication issues were identified that warrant physician communication and completion of prescribed/recommended actions by midnight of the next day:  No  Time spent performing this drug regimen review (minutes):  15- 20 min  Donaciano Range S. Merilynn Finland, PharmD, BCPS Clinical Staff Pharmacist [email or web address] Pasty Spillers 03/26/2021 9:03 AM

## 2021-03-26 NOTE — Evaluation (Signed)
Physical Therapy Assessment and Plan  Patient Details  Name: Daniel Perkins MRN: [id number] Date of Birth: September 02, 1955  PT Diagnosis: Abnormal posture, Abnormality of gait, Difficulty walking, and Muscle weakness Rehab Potential: Good ELOS: 7-10 days   Today's Date: 03/26/2021 PT Individual Time: 1100-1154 PT Individual Time Calculation (min): 54 min    Hospital Problem: Principal Problem:   Debility Active Problems:   SBO (small bowel obstruction) ([place])   Past Medical History:  Past Medical History:  Diagnosis Date   Club foot of both lower extremities 1958   Coronary artery disease    Holter monitor 02/2012 - sinus with rare PVC   CTEV (congenital talipes equinovarus)    Gout    Hyperlipidemia    Hypertension    Myocardial infarction ([place]) ~ 2007   "mild"   Osteomyelitis ([place])    Pneumonia 2000's   "couple times"   Pneumonia due to COVID-19 virus 04/28/2019   Type II diabetes mellitus ([place]) 2011   Past Surgical History:  Past Surgical History:  Procedure Laterality Date   ANTERIOR CERVICAL DECOMP/DISCECTOMY FUSION  X 2   CARDIAC CATHETERIZATION     FOOT FRACTURE SURGERY Right 1990's   "pt a steel plate in"   FOOT SURGERY Left x16   "joints collapsed; keep getting infected"   LAPAROSCOPY N/A 03/05/2021   Procedure: LAPAROSCOPY DIAGNOSTIC;  Surgeon: Clovis Riley, MD;  Location: [facility];  Service: General;  Laterality: N/A;   LAPAROTOMY Right 03/05/2021   Procedure: EXPLORATORY LAPAROTOMY, REPAIR SMALL BOWEL PERFORATION;  Surgeon: Clovis Riley, MD;  Location: [facility];  Service: General;  Laterality: Right;   LYSIS OF ADHESION  03/05/2021   Procedure: LYSIS OF ADHESION;  Surgeon: Clovis Riley, MD;  Location: [facility];  Service: General;;   POSTERIOR CERVICAL FUSION/FORAMINOTOMY  X 2    Assessment & Plan Clinical Impression: Patient is a 65 y.o. year old male with history of CAD, gout, CKD II, T2DM who was admitted on 03/04/21 with 24hour h/o N/V and  abdominal pain. He was found to have SBO and NGT placed for decompression without good results. He was taken to OR on 10/28 for diagnostic laparotomy with lysis of single adhesive band and left lower quadrant with resolution of bowel obstruction and mini laparotomy with primary repair of 2 small bowel perforation by Dr. Windle Guard.  Postop abdominal pain was slowly improving with NG tube in place for persistent ileus.  He required TNA for nutritional supplement and follow-up CT abdomen showed thickened nonspecific small bowel with small pelvic fluid collection therefore Zosyn added 11/03.   Follow-up CT abdomen showed new 3.3 cm loculated fluid collection immediately superior to the umbilicus and posterior to skin staple question seroma or abscess and interval decrease in swallow.  Fluid collection in pelvis.     Dr. Jayme Cloud, ID was consulted for input and recommended changing Zosyn to ceftriaxone and metronidazole in setting of AKI.  IR was consulted for input on aspiration felt fluid collection and felt that there was no role for aspiration/drainage of fluid collection due to decrease in size.  Abdominal pain resolved ,he has slowly been advanced to soft diet and TNA discontinued as intake improved.  Staples were removed on 11/13 and Penrose drain was placed in open wound on 11/17 along incision line to prevent fluid collection.  He is to continue on IV ceftriaxone and metrazol for 2 to 3 weeks from 11/11 with recommendations repeat CT abdomen/pelvis around 11/24 to follow-up on abscess or urgently  as needed signs of sepsis.  ID to be consulted for follow-up after CT performed.  Patient currently requires min with mobility secondary to muscle weakness, decreased cardiorespiratoy endurance, and decreased standing balance, decreased postural control, decreased balance strategies, and difficulty maintaining precautions.  Prior to hospitalization, patient was independent  with mobility and lived with Alone in a House  home.  Home access is 3Stairs to enter, Ramped entrance (ramp in front).  Patient will benefit from skilled PT intervention to maximize safe functional mobility, minimize fall risk, and decrease caregiver burden for planned discharge home with intermittent assist.  Anticipate patient will benefit from follow up [facility] at discharge.  PT - End of Session Activity Tolerance: Tolerates 30+ min activity with multiple rests Endurance Deficit: Yes Endurance Deficit Description: required rest breaks throughout session PT Assessment Rehab Potential (ACUTE/IP ONLY): Good PT Barriers to Discharge: Decreased caregiver support;Wound Care;Lack of/limited family support PT Barriers to Discharge Comments: lives alone, limited support PT Patient demonstrates impairments in the following area(s): Balance;Endurance;Motor;Nutrition;Pain;Skin Integrity PT Transfers Functional Problem(s): Bed Mobility;Bed to Chair;Car;Furniture PT Locomotion Functional Problem(s): Ambulation;Wheelchair Mobility;Stairs PT Plan PT Intensity: Minimum of 1-2 x/day ,45 to 90 minutes PT Frequency: 5 out of 7 days PT Duration Estimated Length of Stay: 7-10 days PT Treatment/Interventions: Ambulation/gait training;Discharge planning;Functional mobility training;Therapeutic Activities;Balance/vestibular training;Disease management/prevention;Neuromuscular re-education;Skin care/wound management;Therapeutic Exercise;Wheelchair propulsion/positioning;DME/adaptive equipment instruction;Pain management;Splinting/orthotics;UE/LE Strength taining/ROM;Community reintegration;Patient/family education;Stair training;UE/LE Coordination activities PT Transfers Anticipated Outcome(s): Mod I PT Locomotion Anticipated Outcome(s): Mod I PT Recommendation Follow Up Recommendations: Home health PT Patient destination: Home Equipment Recommended: To be determined   PT Evaluation Precautions/Restrictions Precautions Precautions: Other  (comment) Precaution Comments: abdominal precautions Restrictions Weight Bearing Restrictions: No Other Position/Activity Restrictions: no lifting >10lbs Pain Interference Pain Interference Pain Effect on Sleep: 0. Does not apply - I have not had any pain or hurting in the past 5 days Pain Interference with Therapy Activities: 1. Rarely or not at all Pain Interference with Day-to-Day Activities: 1. Rarely or not at all Home Living/Prior [place]: Alone Available Help at Discharge: Friend(s);Available PRN/intermittently Type of Home: House Home Access: Stairs to enter;Ramped entrance (ramp in front) Entrance Stairs-Number of Steps: 3 Entrance Stairs-Rails: None Home Layout: One level Bathroom Shower/Tub: Chiropodist: Standard Bathroom Accessibility: Yes Additional Comments: reports a friend would be able to provide intermittent assist  Lives With: Alone Prior Function Level of Independence: Independent with basic ADLs;Independent with homemaking with ambulation;Independent with transfers;Independent with gait  Able to Take Stairs?: Yes Driving: No (hasnt driven in a year) Vocation: On disability Leisure: Hobbies-yes (Comment) Vision/Perception  Vision - History Ability to See in Adequate Light: 0 Adequate Perception Perception: Within Functional Limits Praxis Praxis: Intact  Cognition Overall Cognitive Status: Within Functional Limits for tasks assessed Arousal/Alertness: Awake/alert Orientation Level: Oriented X4 Memory: Appears intact Awareness: Appears intact Problem Solving: Appears intact Safety/Judgment: Appears intact Sensation Sensation Light Touch: Appears Intact Hot/Cold: Appears Intact Proprioception: Appears Intact Stereognosis: Appears Intact Coordination Gross Motor Movements are Fluid and Coordinated: No Fine Motor Movements are Fluid and Coordinated: Yes Coordination and Movement Description:  grossly uncoordinated due to weakness/deconditioning and decreased balance/postural control Finger Nose Finger Test: [facility] but decreased precision on L Heel Shin Test: [facility] bilaterally Motor  Motor Motor: Abnormal postural alignment and control Motor - Skilled Clinical Observations: grossly uncoordinated due to weakness/deconditioning and decreased balance/postural control  Trunk/Postural Assessment  Cervical Assessment Cervical Assessment: Exceptions to [facility] (forward head) Thoracic Assessment Thoracic Assessment: Exceptions to [facility] (mild kyphosis)  Lumbar Assessment Lumbar Assessment: Exceptions to [facility] (posterior pelvic tilt) Postural Control Postural Control: Deficits on evaluation (very minimal posterior lean in standing)  Balance Balance Balance Assessed: Yes Static Sitting Balance Static Sitting - Balance Support: Feet supported;Bilateral upper extremity supported Static Sitting - Level of Assistance: 5: Stand by assistance (supervision) Dynamic Sitting Balance Dynamic Sitting - Balance Support: Feet supported;No upper extremity supported Dynamic Sitting - Level of Assistance: 5: Stand by assistance (supervision) Static Standing Balance Static Standing - Balance Support: No upper extremity supported Static Standing - Level of Assistance: 5: Stand by assistance (CGA) Dynamic Standing Balance Dynamic Standing - Balance Support: No upper extremity supported Dynamic Standing - Level of Assistance: 4: Min assist Dynamic Standing - Comments: with transfers Extremity Assessment  RLE Assessment RLE Assessment: Exceptions to [facility] General Strength Comments: grossly generalized to 4-/5 LLE Assessment LLE Assessment: Exceptions to [facility] General Strength Comments: grossly generalized to 4-/5  Care Tool Care Tool Bed Mobility Roll left and right activity        Sit to lying activity        Lying to sitting on side of bed activity         Care Tool Transfers Sit to stand transfer    Sit to stand assist level: Minimal Assistance - Patient > 75%    Chair/bed transfer   Chair/bed transfer assist level: Minimal Assistance - Patient > 75%     Toilet transfer   Assist Level: Minimal Assistance - Patient > 75%    Car transfer   Car transfer assist level: Minimal Assistance - Patient > 75%      Care Tool Locomotion Ambulation   Assist level: Moderate Assistance - Patient 50 - 74% Assistive device: Other (comment) (handrail and HHA) Max distance: 67f  Walk 10 feet activity   Assist level: Moderate Assistance - Patient - 50 - 74% Assistive device: Other (comment) (handrail and HHA)   Walk 50 feet with 2 turns activity   Assist level: Moderate Assistance - Patient - 50 - 74% Assistive device: Other (comment) (handrail and HHA)  Walk 150 feet activity Walk 150 feet activity did not occur: Safety/medical concerns (fatigue, weakness, decreased balance)      Walk 10 feet on uneven surfaces activity Walk 10 feet on uneven surfaces activity did not occur: Safety/medical concerns (fatigue, weakness, decreased balance)      Stairs Stair activity did not occur: Safety/medical concerns (fatigue, weakness, decreased balance)        Walk up/down 1 step activity Walk up/down 1 step or curb (drop down) activity did not occur: Safety/medical concerns (fatigue, weakness, decreased balance)      Walk up/down 4 steps activity Walk up/down 4 steps activity did not occur: Safety/medical concerns (fatigue, weakness, decreased balance)      Walk up/down 12 steps activity Walk up/down 12 steps activity did not occur: Safety/medical concerns (fatigue, weakness, decreased balance)      Pick up small objects from floor Pick up small object from the floor (from standing position) activity did not occur: Safety/medical concerns (fatigue, weakness, decreased balance)      Wheelchair Is the patient using a wheelchair?: Yes Type of Wheelchair: Manual   Wheelchair assist level:  Dependent - Patient 0% Max wheelchair distance: >1529f Wheel 50 feet with 2 turns activity   Assist Level: Dependent - Patient 0%  Wheel 150 feet activity   Assist Level: Dependent - Patient 0%    Refer to Care Plan for Long Term Goals  SHORT TERM GOAL WEEK 1 PT Short Term Goal 1 (Week 1): STG=LTG due to LOS  Recommendations for other services: None   Skilled Therapeutic Intervention Evaluation completed (see details above and below) with education on PT POC and goals and individual treatment initiated with focus on functional mobility/transfers, generalized strengthening, dynamic standing balance/coordination, ambulation, simulated car transfers, and improved activity tolerance. Received pt sitting in recliner, pt educated on PT evaluation, CIR policies, and therapy schedule and agreeable. Pt denied any pain during session. Provided pt with RW and 18x18 manual WC, cushion, and standard legrests. Pt transferred recliner<>WC stand<>pivot without AD and min A. Pt transported to/from room in [facility] total A for time management and energy conservation purposes. Pt performed simulated car transfer without AD and min A with cues for safe entry as pt initially attempting to side step into car and slightly losing balance. Pt then ambulated 30f with handrail and HHA with mod A overall. Pt requested to toilet and ambulated 184fx 2 trials with RW and CGA to/from bathroom. Pt able to manage clothing with CGA and void but unable to have BM. Concluded session with pt sitting in recliner, needs within reach, and chair pad alarm on. Safety plan updated.   Mobility Transfers Transfers: Sit to Stand;Stand to Sit;Stand Pivot Transfers Sit to Stand: Minimal Assistance - Patient > 75% Stand to Sit: Contact Guard/Touching assist Stand Pivot Transfers: Minimal Assistance - Patient > 75% Stand Pivot Transfer Details: Verbal cues for sequencing;Verbal cues for technique Stand Pivot Transfer Details (indicate cue type  and reason): cues for pivoting technique and hand placement Transfer (Assistive device): None Locomotion  Gait Ambulation: Yes Gait Assistance: Moderate Assistance - Patient 50-74% Gait Distance (Feet): 50 Feet Assistive device: Other (Comment) (handrail and HHA) Gait Assistance Details: Verbal cues for sequencing;Verbal cues for technique Gait Assistance Details: verbal cues for turning technique Gait Gait: Yes Gait Pattern: Impaired Gait Pattern: Step-to pattern;Decreased trunk rotation;Wide base of support;Antalgic;Decreased stride length;Decreased step length - right;Decreased step length - left;Trunk flexed;Poor foot clearance - left;Poor foot clearance - right Gait velocity: decreased   Discharge Criteria: Patient will be discharged from PT if patient refuses treatment 3 consecutive times without medical reason, if treatment goals not met, if there is a change in medical status, if patient makes no progress towards goals or if patient is discharged from hospital.  The above assessment, treatment plan, treatment alternatives and goals were discussed and mutually agreed upon: by patient  AnAlfonse AlpersT, DPT  03/26/2021, 12:22 PM

## 2021-03-26 NOTE — Plan of Care (Signed)
  Problem: Consults Goal: RH GENERAL PATIENT EDUCATION Description: See Patient Education module for education specifics. Outcome: Progressing Goal: Skin Care Protocol Initiated - if Braden Score 18 or less Description: If consults are not indicated, leave blank or document N/A Outcome: Progressing Goal: Diabetes Guidelines if Diabetic/Glucose > 140 Description: If diabetic or lab glucose is > 140 mg/dl - Initiate Diabetes/Hyperglycemia Guidelines & Document Interventions  Outcome: Progressing   Problem: RH SKIN INTEGRITY Goal: RH STG MAINTAIN SKIN INTEGRITY WITH ASSISTANCE Description: STG Maintain Skin Integrity With Mod I Assistance. Outcome: Progressing Goal: RH STG ABLE TO PERFORM INCISION/WOUND CARE W/ASSISTANCE Description: STG Able To Perform Incision/Wound Care With Mod I Assistance. Outcome: Progressing   Problem: RH SAFETY Goal: RH STG ADHERE TO SAFETY PRECAUTIONS W/ASSISTANCE/DEVICE Description: STG Adhere to Safety Precautions With Cues and Reminders. Outcome: Progressing Goal: RH STG DECREASED RISK OF FALL WITH ASSISTANCE Description: STG Decreased Risk of Fall With Mod I Assistance. Outcome: Progressing   Problem: RH PAIN MANAGEMENT Goal: RH STG PAIN MANAGED AT OR BELOW PT'S PAIN GOAL Description: < 3 on a 0-10 pain scale. Outcome: Progressing   Problem: RH KNOWLEDGE DEFICIT GENERAL Goal: RH STG INCREASE KNOWLEDGE OF SELF CARE AFTER HOSPITALIZATION Description: Patient will demonstrate knowledge of medication/pain management, skin/wound care, and safety precautions with educational materials and handouts provided by staff independently at discharge. Outcome: Progressing

## 2021-03-26 NOTE — Progress Notes (Signed)
Physical Therapy Session Note  Patient Details  Name: Daniel Perkins MRN: [id number] Date of Birth: 01/16/1956  Today's Date: 03/26/2021 PT Individual Time: 7989-2119 PT Individual Time Calculation (min): 70 min   Short Term Goals: Week 1:  PT Short Term Goal 1 (Week 1): STG=LTG due to LOS  Skilled Therapeutic Interventions/Progress Updates:   Received pt sitting in recliner, pt agreeable to PT treatment, and reported pain in abdomen and feeling urge to have BM but unsuccessful. RN notified and provided pt with Miralax. Session with emphasis on functional mobility/transfers, generalized strengthening, dynamic standing balance/coordination, gait training, NMR, and improved activity tolerance. Stand<>pivot recliner<>WC with RW and min A and transported to/from room in [facility] total A for time management purposes. Pt ambulated 185ft with RW and min A - noted R ankle ER (pt reports this is baseline), with decreased bilateral foot clearance, and mild flexed trunk. Pt then performed BUE/LE strengthening on Nustep at workload 2 for 8 minutes for a total of 523 steps with emphasis on cardiovascular endurance. Ambulated 69ft with RW and min A to mat and worked on dynamic standing balance performing alternating toe taps to 6in step 2x10 with BUE support on RW and min A for balance. Transitioned to tossing horseshoes using RUE x 1 trial with RW and x 2 trials without AD and CGA/min A for balance. Worked on blocked practice sit<>stands from slightly elevated EOM without UE support 3x5 reps with CGA for balance with 1 R lateral LOB requiring min A to correct. Stand<>pivot mat<>WC without AD and min A and ambulated 21ft with RW and CGA to recliner.  Concluded session with pt sitting in recliner, needs within reach, and chair pad alarm on.   Therapy Documentation Precautions:  Precautions Precautions: Other (comment) Precaution Comments: abdominal precautions Restrictions Weight Bearing Restrictions: No Other  Position/Activity Restrictions: no lifting >10lbs  Therapy/Group: Individual Therapy Martin Majestic PT, DPT   03/26/2021, 7:15 AM

## 2021-03-26 NOTE — Progress Notes (Signed)
Inpatient Rehabilitation Center Individual Statement of Services  Patient Name:  Daniel Perkins  Date:  03/26/2021  Welcome to the Inpatient Rehabilitation Center.  Our goal is to provide you with an individualized program based on your diagnosis and situation, designed to meet your specific needs.  With this comprehensive rehabilitation program, you will be expected to participate in at least 3 hours of rehabilitation therapies Monday-Friday, with modified therapy programming on the weekends.  Your rehabilitation program will include the following services:  Physical Therapy (PT), Occupational Therapy (OT), Speech Therapy (ST), 24 hour per day rehabilitation nursing, Therapeutic Recreaction (TR), Neuropsychology, Care Coordinator, Rehabilitation Medicine, Nutrition Services, Pharmacy Services, and Other  Weekly team conferences will be held on Wednesdays to discuss your progress.  Your Inpatient Rehabilitation Care Coordinator will talk with you frequently to get your input and to update you on team discussions.  Team conferences with you and your family in attendance may also be held.  Expected length of stay:  5-7 Days  Overall anticipated outcome:  MOD I  Depending on your progress and recovery, your program may change. Your Inpatient Rehabilitation Care Coordinator will coordinate services and will keep you informed of any changes. Your Inpatient Rehabilitation Care Coordinator's name and contact numbers are listed  below.  The following services may also be recommended but are not provided by the Inpatient Rehabilitation Center:   Home Health Rehabiltiation Services Outpatient Rehabilitation Services    Arrangements will be made to provide these services after discharge if needed.  Arrangements include referral to agencies that provide these services.  Your insurance has been verified to be:  [facility] Medicare Your primary doctor is:  Ellyn Hack, MD  Pertinent information will  be shared with your doctor and your insurance company.  Inpatient Rehabilitation Care Coordinator:  Lavera Guise, [place] [phone] or [phone]  Information discussed with and copy given to patient by: Andria Rhein, 03/26/2021, 12:14 PM

## 2021-03-26 NOTE — Progress Notes (Signed)
Inpatient Rehabilitation  Patient information reviewed and entered into eRehab system by Daniel Oregel M. [place], PerkinsA., CCC/SLP, PPS Coordinator.  Information including medical coding, functional ability and quality indicators will be reviewed and updated through discharge.    

## 2021-03-27 DIAGNOSIS — I10 Essential (primary) hypertension: Secondary | ICD-10-CM

## 2021-03-27 DIAGNOSIS — E119 Type 2 diabetes mellitus without complications: Secondary | ICD-10-CM

## 2021-03-27 DIAGNOSIS — E875 Hyperkalemia: Secondary | ICD-10-CM | POA: Diagnosis not present

## 2021-03-27 DIAGNOSIS — R5381 Other malaise: Secondary | ICD-10-CM | POA: Diagnosis not present

## 2021-03-27 DIAGNOSIS — K56609 Unspecified intestinal obstruction, unspecified as to partial versus complete obstruction: Secondary | ICD-10-CM

## 2021-03-27 LAB — GLUCOSE, CAPILLARY
Glucose-Capillary: 109 mg/dL — ABNORMAL HIGH (ref 70–99)
Glucose-Capillary: 112 mg/dL — ABNORMAL HIGH (ref 70–99)
Glucose-Capillary: 139 mg/dL — ABNORMAL HIGH (ref 70–99)
Glucose-Capillary: 134 mg/dL — ABNORMAL HIGH (ref 70–99)

## 2021-03-27 NOTE — Progress Notes (Signed)
Physical Therapy Session Note  Patient Details  Name: Daniel Perkins MRN: [id number] Date of Birth: 1955-12-25  Today's Date: 03/27/2021 PT Individual Time: 5102-5852 and 1300-1346 PT Individual Time Calculation (min): 54 min and 46 min  Short Term Goals: Week 1:  PT Short Term Goal 1 (Week 1): STG=LTG due to LOS  Skilled Therapeutic Interventions/Progress Updates:   Treatment Session 1 Received pt semi-reclined in bed, pt agreeable to PT treatment, and denied any pain during session. Session with emphasis on functional mobility/transfers, dressing, generalized strengthening, dynamic standing balance/coordination, gait training, and improved activity tolerance. Pt transferred semi-reclined<>sitting EOB with HOB elevated and use of bedrails with supervision and donned pants and shoes sitting EOB with supervision. Sit<>stand from EOB without AD and CGA to pull pants over hips. Donned shirt and sweatshirt sitting EOB with supervision and transferred bed<>WC stand<>pivot with RW and CGA. Pt performed WC mobility 153ft x 2 using BUE and supervision to 4W therapy gym including navigating on/off elevator. Sit<>stands with RW and CGA throughout session and ambulated 229ft x 2 trials with RW and CGA. Pt demonstrates flexed trunk/downward gaze, decreased stance time on LLE, and decreased bilateral foot clearance - cues to increase step length. Pt required seated rest break after ambulating then performed side stepping inside // bars x 4 laps with BUE support and x 4 laps with 1 UE support and CGA for balance - cues to keep toes pointed forward but difficulty due to R ankle eversion. Transitioned to high knee marching inside // bars x 1 lap with BUE support then x 3 laps with 1 UE support and CGA for balance. Worked on forward/backwards tandem walking inside // bars x 4 laps with BUE support and CGA - noted decreased ROM in R>L ankle (due to previous ankle surgeries). Pt transported back to room in [facility] total A  and ambulated 89ft with RW and CGA to recliner. Concluded session with pt sitting in recliner, needs within reach, and chair pad alarm on. Provided pt with fresh ice water.   Treatment Session 2 Received pt sitting in recliner asleep, pt easily aroused and agreeable to PT treatment, and denied any pain during session. Session with emphasis on functional mobility/transfers, generalized strengthening, dynamic standing balance/coordination, gait training, toileting, and improved activity tolerance. Stand<>pivot recliner<>WC with RW and CGA and pt transported to [employer] in [facility] total A for time management purposes. Pt navigated 8 steps with 2 rails and min A (first 4 steps ascending and descending with a step through pattern and second 4 steps ascending and descending with a step to pattern for improved stability). Pt with difficulty stepping down due to decreased ankle DF. Pt then ambulated 148ft with RW and CGA to ortho gym and performed seated BUE strengthening on UBE at level 3 for 3 minutes forward and 3 minutes backwards. Pt ambulated additional 16ft x 1 and 155ft x 1 with RW and CGA back to room including getting on/off elevator. Pt then reported urge to have BM and ambulated in/out of bathroom with RW and CGA and able to manage clothing with CGA. Pt able to void and with small BM. Pt able to perform peri-care standing with CGA and stood at sink to wash hands with CGA. Concluded session with pt sitting in recliner, needs within reach, and chair pad alarm on.   Therapy Documentation Precautions:  Precautions Precautions: Other (comment) Precaution Comments: abdominal precautions Restrictions Weight Bearing Restrictions: No Other Position/Activity Restrictions: no lifting >10lbs  Therapy/Group: Individual Therapy Alfonso Patten  Raechel Chute PT, DPT   03/27/2021, 7:14 AM

## 2021-03-27 NOTE — Progress Notes (Signed)
PROGRESS NOTE   Subjective/Complaints: Patient seen sitting up in bed this morning.  He states he slept well overnight.  He denies complaints.  He had a good first day of therapies yesterday.  ROS: Denies CP, SOB, N/V/D  Objective:   No results found. Recent Labs    03/26/21 0608  WBC 6.0  HGB 11.9*  HCT 37.2*  PLT 367    Recent Labs    03/26/21 0608  NA 134*  K 5.4*  CL 101  CO2 25  GLUCOSE 110*  BUN 18  CREATININE 1.08  CALCIUM 9.6     Intake/Output Summary (Last 24 hours) at 03/27/2021 0848 Last data filed at 03/27/2021 0600 Gross per 24 hour  Intake 830 ml  Output 375 ml  Net 455 ml         Physical Exam: Vital Signs Blood pressure 121/76, pulse 73, temperature 98.2 F (36.8 C), resp. rate 20, height 5\' 7"  (1.702 m), weight 92.1 kg, SpO2 96 %. Constitutional: No distress . Vital signs reviewed. HENT: Normocephalic.  Atraumatic. Eyes: EOMI. No discharge. Cardiovascular: No JVD.  RRR. Respiratory: Normal effort.  No stridor.  Bilateral clear to auscultation. GI: Non-distended.  BS +. Skin: Warm and dry.  Abdominal wound CDI Psych: Normal mood.  Normal behavior. Musc: No edema in extremities.  No tenderness in extremities. Neuro: Alert  Motor: Grossly 4+-5/5 throughout  Assessment/Plan: 1. Functional deficits which require 3+ hours per day of interdisciplinary therapy in a comprehensive inpatient rehab setting. Physiatrist is providing close team supervision and 24 hour management of active medical problems listed below. Physiatrist and rehab team continue to assess barriers to discharge/monitor patient progress toward functional and medical goals  Care Tool:  Bathing    Body parts bathed by patient: Right arm, Left arm, Chest, Right upper leg, Left upper leg, Face   Body parts bathed by helper: Front perineal area, Buttocks     Bathing assist Assist Level: Minimal Assistance - Patient  > 75%     Upper Body Dressing/Undressing Upper body dressing   What is the patient wearing?: Pull over shirt    Upper body assist Assist Level: Supervision/Verbal cueing    Lower Body Dressing/Undressing Lower body dressing      What is the patient wearing?: Pants     Lower body assist Assist for lower body dressing: Minimal Assistance - Patient > 75%     Toileting Toileting    Toileting assist Assist for toileting: Moderate Assistance - Patient 50 - 74%     Transfers Chair/bed transfer  Transfers assist     Chair/bed transfer assist level: Minimal Assistance - Patient > 75%     Locomotion Ambulation   Ambulation assist      Assist level: Moderate Assistance - Patient 50 - 74% Assistive device: Other (comment) (handrail and HHA) Max distance: 46ft   Walk 10 feet activity   Assist     Assist level: Moderate Assistance - Patient - 50 - 74% Assistive device: Other (comment) (handrail and HHA)   Walk 50 feet activity   Assist    Assist level: Moderate Assistance - Patient - 50 - 74% Assistive device: Other (comment) (handrail  and HHA)    Walk 150 feet activity   Assist Walk 150 feet activity did not occur: Safety/medical concerns (fatigue, weakness, decreased balance)         Walk 10 feet on uneven surface  activity   Assist Walk 10 feet on uneven surfaces activity did not occur: Safety/medical concerns (fatigue, weakness, decreased balance)         Wheelchair     Assist Is the patient using a wheelchair?: Yes Type of Wheelchair: Manual    Wheelchair assist level: Dependent - Patient 0% Max wheelchair distance: >15ft    Wheelchair 50 feet with 2 turns activity    Assist        Assist Level: Dependent - Patient 0%   Wheelchair 150 feet activity     Assist      Assist Level: Dependent - Patient 0%   Blood pressure 121/76, pulse 73, temperature 98.2 F (36.8 C), resp. rate 20, height 5\' 7"  (1.702 m),  weight 92.1 kg, SpO2 96 %.  Medical Problem List and Plan: 1.  Debility secondary to SBO with bowel perforation s/p NGT and TPN and persistent ileus and constipation on IV ABX for pelvic abscess  Continue CIR 2.  Impaired mobility: continue Lovenox             -antiplatelet therapy: N/A 3. Headaches/Chronic neck pain/Pain Management: Takes gabapentin prn --continue oxycodone prn              Controlled on 11/19 with meds 4. Mood: LCSW to follow for evaluation and support.              -antipsychotic agents: N/a 5. Neuropsych: This patient is capable of making decisions on his own behalf. 6. Skin/Wound Care: Routine  pressure  relief measures 7. Fluids/Electrolytes/Nutrition: Monitor I/Os 8. SBO with ileus s/p lysis of adhesions: Continue wound care bid with bid wet to dry dressing changes - moistened plain guaze packing strips or 2 inch kerlix             Improving 9. Abdominal wall seroma: Penrose drain placed 11/17 and to be removed in 2 weeks in clinic. 10. Wound infection: On Cefotetan, Rocephin and Flagyl thorough 11/28             -- Repeat CT abdomen pelvis 11/24 and to consult ID for follow-up on antibiotic regimen. 11. HTN: Monitor BP TID  Controlled 11/19 12. T2DM: Hgb a1c- 6.0 and well controlled. Will monitor BS ac/hs and use SSI for elevated BS. Continue glargine insulin. --Used victoza 1.8, glucotrol and Jardiance. Relatively controlled on 11/19 13. Gout: Stable on colchicine.  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  15. Hyperkalemia: 1 dose kayexalate 11/18  Labs ordered for Monday 16. Hyponatremia: repeat Na on Monday  LOS: 2 days A FACE TO FACE EVALUATION WAS PERFORMED  Daniel Perkins Sunday 03/27/2021, 8:48 AM

## 2021-03-27 NOTE — Plan of Care (Signed)
  Problem: Consults Goal: RH GENERAL PATIENT EDUCATION Description: See Patient Education module for education specifics. Outcome: Progressing Goal: Skin Care Protocol Initiated - if Braden Score 18 or less Description: If consults are not indicated, leave blank or document N/A Outcome: Progressing Goal: Diabetes Guidelines if Diabetic/Glucose > 140 Description: If diabetic or lab glucose is > 140 mg/dl - Initiate Diabetes/Hyperglycemia Guidelines & Document Interventions  Outcome: Progressing   Problem: RH SKIN INTEGRITY Goal: RH STG MAINTAIN SKIN INTEGRITY WITH ASSISTANCE Description: STG Maintain Skin Integrity With Mod I Assistance. Outcome: Progressing Goal: RH STG ABLE TO PERFORM INCISION/WOUND CARE W/ASSISTANCE Description: STG Able To Perform Incision/Wound Care With Mod I Assistance. Outcome: Progressing   Problem: RH SAFETY Goal: RH STG ADHERE TO SAFETY PRECAUTIONS W/ASSISTANCE/DEVICE Description: STG Adhere to Safety Precautions With Cues and Reminders. Outcome: Progressing Goal: RH STG DECREASED RISK OF FALL WITH ASSISTANCE Description: STG Decreased Risk of Fall With Mod I Assistance. Outcome: Progressing   Problem: RH PAIN MANAGEMENT Goal: RH STG PAIN MANAGED AT OR BELOW PT'S PAIN GOAL Description: < 3 on a 0-10 pain scale. Outcome: Progressing   Problem: RH KNOWLEDGE DEFICIT GENERAL Goal: RH STG INCREASE KNOWLEDGE OF SELF CARE AFTER HOSPITALIZATION Description: Patient will demonstrate knowledge of medication/pain management, skin/wound care, and safety precautions with educational materials and handouts provided by staff independently at discharge. Outcome: Progressing

## 2021-03-27 NOTE — Progress Notes (Signed)
Occupational Therapy Session Note  Patient Details  Name: Daniel Perkins MRN: [id number] Date of Birth: 10-Jul-1955  Today's Date: 03/27/2021 OT Individual Time: 6384-5364 OT Individual Time Calculation (min): 68 min    Short Term Goals: Week 1:  OT Short Term Goal 1 (Week 1): STG = LTG due to ELOS  Skilled Therapeutic Interventions/Progress Updates:  Skilled OT intervention completed with focus on ADL retraining, BUE strengthening and functional transfers. Pt received seated in recliner, asleep, easily aroused and agreeable to session. Sit > stand with supervision using RW, then ambulatory transfer to standing at sink, with pt completing oral care and other grooming tasks at sink with supervision assist and pt using counter for UE support. Required cues for balance, and education provided about energy conservation strategies while completing ADLs in standing. Ambulatory transfer using RW to [facility] in bathroom, with supervision for transfer and doffing pants. Pt reported being unable to void/BM, sit > stand with CGA, requiring min A for LB clothing due to being at ankles. Safety cues required to prevent pt from bending forward to reach pants while in standing at floor. Completed hand washing at sink with supervision. Ambulated to therapy gym about 75 ft, using RW and supervision. Then participated in BUE strengthening exercises to promote UE endurance needed for functional transfers and ADL management, including the following:  Bicep flexion x15 each arm, 5 pound dumbbell Overhead press x15 each arm, 5 pound dumbbell Bicep flexion x15 with green theraband Shoulder flexion x15 with green theraband  Cues required for form and positioning throughout. HEP provided for green theraband exercises to promote continuity of BUE strengthening upon d/c. Education provided about how to grade up exercises, or grade down. Educated on avoiding some exercises such as horizontal abduction due to abdominal  precautions, to prevent incisional disrupt and advised pt to not lift >10 pounds if using dumbbell at home due to precautions.   Sit > stand and ambulatory transfer to shower in therapy room, using RW and supervision assist. Pt able to complete shower transfer onto TTB with supervision assist and cues needed for positioning BLEs over threshold while seated. Discussed how tub/shower transfer would be increased challenge for lifting legs, and simulated higher leg lift during another shower transfer trial. Education provided about safe showering strategies with shower curtain and preventing falls during shower/exiting shower. Pt ambulated back to room about 75 ft with same level of assist, opting to return to recliner. Pt left seated in recliner, with chair alarm on, all needs in reach and ensure provided per request at end of session.   Therapy Documentation Precautions:  Precautions Precautions: Other (comment) Precaution Comments: abdominal precautions Restrictions Weight Bearing Restrictions: No Other Position/Activity Restrictions: no lifting >10lbs  Pain: Unrated pain in L shoulder during shoulder flexion exercise, removed weight and did AROM only   Therapy/Group: Individual Therapy  Hiromi Knodel E Hannan Tetzlaff 03/27/2021, 7:28 AM

## 2021-03-28 LAB — GLUCOSE, CAPILLARY
Glucose-Capillary: 132 mg/dL — ABNORMAL HIGH (ref 70–99)
Glucose-Capillary: 97 mg/dL (ref 70–99)
Glucose-Capillary: 134 mg/dL — ABNORMAL HIGH (ref 70–99)
Glucose-Capillary: 122 mg/dL — ABNORMAL HIGH (ref 70–99)

## 2021-03-28 NOTE — Plan of Care (Signed)
  Problem: Consults Goal: RH GENERAL PATIENT EDUCATION Description: See Patient Education module for education specifics. Outcome: Progressing Goal: Skin Care Protocol Initiated - if Braden Score 18 or less Description: If consults are not indicated, leave blank or document N/A Outcome: Progressing Goal: Diabetes Guidelines if Diabetic/Glucose > 140 Description: If diabetic or lab glucose is > 140 mg/dl - Initiate Diabetes/Hyperglycemia Guidelines & Document Interventions  Outcome: Progressing   Problem: RH SKIN INTEGRITY Goal: RH STG MAINTAIN SKIN INTEGRITY WITH ASSISTANCE Description: STG Maintain Skin Integrity With Mod I Assistance. Outcome: Progressing Goal: RH STG ABLE TO PERFORM INCISION/WOUND CARE W/ASSISTANCE Description: STG Able To Perform Incision/Wound Care With Mod I Assistance. Outcome: Progressing   Problem: RH SAFETY Goal: RH STG ADHERE TO SAFETY PRECAUTIONS W/ASSISTANCE/DEVICE Description: STG Adhere to Safety Precautions With Cues and Reminders. Outcome: Progressing Goal: RH STG DECREASED RISK OF FALL WITH ASSISTANCE Description: STG Decreased Risk of Fall With Mod I Assistance. Outcome: Progressing   Problem: RH PAIN MANAGEMENT Goal: RH STG PAIN MANAGED AT OR BELOW PT'S PAIN GOAL Description: < 3 on a 0-10 pain scale. Outcome: Progressing   Problem: RH KNOWLEDGE DEFICIT GENERAL Goal: RH STG INCREASE KNOWLEDGE OF SELF CARE AFTER HOSPITALIZATION Description: Patient will demonstrate knowledge of medication/pain management, skin/wound care, and safety precautions with educational materials and handouts provided by staff independently at discharge. Outcome: Progressing

## 2021-03-29 DIAGNOSIS — R5381 Other malaise: Secondary | ICD-10-CM | POA: Diagnosis not present

## 2021-03-29 LAB — BASIC METABOLIC PANEL
Anion gap: 7 (ref 5–15)
BUN: 17 mg/dL (ref 8–23)
CO2: 26 mmol/L (ref 22–32)
Calcium: 8.9 mg/dL (ref 8.9–10.3)
Chloride: 101 mmol/L (ref 98–111)
Creatinine, Ser: 0.95 mg/dL (ref 0.61–1.24)
GFR, Estimated: >60 mL/min (ref >60)
Glucose, Bld: 121 mg/dL — ABNORMAL HIGH (ref 70–99)
Potassium: 3.9 mmol/L (ref 3.5–5.1)
Sodium: 134 mmol/L — ABNORMAL LOW (ref 135–145)

## 2021-03-29 LAB — CBC
HCT: 35.5 % — ABNORMAL LOW (ref 39.0–52.0)
Hemoglobin: 10.8 g/dL — ABNORMAL LOW (ref 13.0–17.0)
MCH: 21.7 pg — ABNORMAL LOW (ref 26.0–34.0)
MCHC: 30.4 g/dL (ref 30.0–36.0)
MCV: 71.3 fL — ABNORMAL LOW (ref 80.0–100.0)
Platelets: 342 10*3/uL (ref 150–400)
RBC: 4.98 MIL/uL (ref 4.22–5.81)
RDW: 15 % (ref 11.5–15.5)
WBC: 5.5 10*3/uL (ref 4.0–10.5)
nRBC: 0 % (ref 0.0–0.2)

## 2021-03-29 LAB — GLUCOSE, CAPILLARY
Glucose-Capillary: 134 mg/dL — ABNORMAL HIGH (ref 70–99)
Glucose-Capillary: 99 mg/dL (ref 70–99)
Glucose-Capillary: 118 mg/dL — ABNORMAL HIGH (ref 70–99)
Glucose-Capillary: 125 mg/dL — ABNORMAL HIGH (ref 70–99)

## 2021-03-29 LAB — MAGNESIUM: Magnesium: 1.7 mg/dL (ref 1.7–2.4)

## 2021-03-29 LAB — VITAMIN D 25 HYDROXY (VIT D DEFICIENCY, FRACTURES): Vit D, 25-Hydroxy: 32.61 ng/mL (ref 30–100)

## 2021-03-29 NOTE — Progress Notes (Signed)
Physical Therapy Session Note  Patient Details  Name: Daniel Perkins MRN: [id number] Date of Birth: 1956/01/15  Today's Date: 03/29/2021 PT Individual Time: 0930-1040 PT Individual Time Calculation (min): 70 min   Short Term Goals: Week 1:  PT Short Term Goal 1 (Week 1): STG=LTG due to LOS  Skilled Therapeutic Interventions/Progress Updates:  Pt received seated in recliner in room, denied pain and was agreeable to PT. Emphasis of session on dynamic balance, improved endurance and BLE strengthening. Pt performed sit <>stand pivot from recliner to [facility] w/min A for steadying. Pt self-propelled 50' using BUEs and S* to 5N hallway. Pt performed sit <>stands w/S* throughout session from [facility]. Pt ambulated 117' loop x2 w/RW and CGA. Noted kyphotic posture, sustained hip flexion through stance phase, decreased step length, lateral step deviations and overreliance on BUEs. Min multimodal cues for glute activation and shoulder retraction to facilitate upright posture, which corrected step placement. Pt able to self-correct posture for 20-25s until he returns to hunched posture, requiring min tactile cues.   BLE strength and dynamic balance w/emphasis on glute med/max for improved posture and gait:  -Lateral monster walks w/green theraband around distal quads, 2x10 ft each direction w/BUE support on hallway rail and CGA. Min verbal cues to maintain BLE abduction, mini squat position and upright chest for optimal muscle activation and posture.  -Progressed to fwd/backward monster walks w/green theraband, LUE on handrail and min A for HHA on RUE, 4x10 each direction. Pt demonstrated improved posture and postioning with forward stepping, noted difficulty maintaining BLE abduction w/backward stepping.   Pt self-propelled >150' w/S* in [facility] to ortho gym and performed 10 minutes on SciFit level 2 for improved shoulder ROM and reduced soreness in BUEs, 5 min fwd and 5 min retro. Pt reported FOOSH injury to L shoulder  in past year in which he did not receive therapy. Pt requesting therapy to L shoulder for pain reduction w/ADLs.   Pt self-propelled >150' back to room w/S* and performed sit <> stand pivot from [facility] to recliner w/CGA. Pt was left seated in recliner in room, all needs in reach.   Therapy Documentation Precautions:  Precautions Precautions: Other (comment) Precaution Comments: abdominal precautions Restrictions Weight Bearing Restrictions: No Other Position/Activity Restrictions: no lifting >10lbs    Therapy/Group: Individual Therapy Jill Alexanders Khaza Blansett, PT, DPT  03/29/2021, 7:44 AM

## 2021-03-29 NOTE — Progress Notes (Signed)
Physical Therapy Session Note  Patient Details  Name: Caine Barfield MRN: [id number] Date of Birth: [birth year]  Today's Date: 03/29/2021 PT Individual Time: 1300-1410 PT Individual Time Calculation (min): 70 min   Short Term Goals: Week 1:  PT Short Term Goal 1 (Week 1): STG=LTG due to LOS  Skilled Therapeutic Interventions/Progress Updates:   Received pt sitting in recliner with family present at bedside. Pt agreeable to PT treatment and denied any pain during session. Session with emphasis on functional mobility, generalized strengthening, dynamic standing balance/coordination, gait training, NMR, and improved activity tolerance. Provided pt with rollator and educated pt on rollator safety (brakes management and pushing rollator against stable surface/wall prior to sitting) - pt demonstrated good carry over of rollator safety throughout session. Sit<>stands with rollator and CGA throughout session and pt ambulated 123ft x 1, 78ft x 1, and >168ft x 1 using rollator and CGA/min A with 1 instance of anterior LOB requiring mod A to correct - cues to stay closer to rollator and for bilateral LE foot clearance. Sit<>stand on Airex with min A x 4 trials and worked on dynamic standing balance tossing beanbags with RUE with min A for balance with 1 R lateral LOB requiring light mod A to correct. Worked on eccentric control without using UEs when sitting down in between trials. Ambulated 70ft with rollator and CGA and performed standing BLE strengthening on Kinetron at 20 cm/sec x 1 and 30 cm/sec x 1 with BUE support and CGA for balance with emphasis on LE strength and endurance. Transitioned to seated BLE strengthening for additional 1 minute x 2 trials at 30 cm/sec due to fatigue. Increased time spent adjusting footplate's due to decreased ankle DF from previous ankle surgeries. Pt required extensive rest breaks in between trials due to fatigue. Pt performed the following exercises sitting with supervision  and verbal/visual cues for technique - caution to adhere to abdominal precautions (no lifting >10lbs): -bicep curls with 4lb dowel 2x12 -horizontal chest press with 2lb dowel 2x12 -overhead shoulder press with 2lb dowel 2x10 Pt ambulated additional 272ft x 1 and 132ft x 1 using rollator and CGA back to room. Pt reported feeling "exhausted" afterwards but pleased with progress. Concluded session with pt sitting in recliner, needs within reach, and chair pad alarm on.  Therapy Documentation Precautions:  Precautions Precautions: Other (comment) Precaution Comments: abdominal precautions Restrictions Weight Bearing Restrictions: No Other Position/Activity Restrictions: no lifting >10lbs  Therapy/Group: Individual Therapy Martin Majestic PT, DPT   03/29/2021, 7:32 AM

## 2021-03-29 NOTE — Progress Notes (Signed)
PROGRESS NOTE   Subjective/Complaints: No complaints this morning  Just worked with therapy Discussed wound care with nursing.  Hgb slightly decreased  ROS: Denies CP, SOB, N/V/D  Objective:   No results found. Recent Labs    03/29/21 0319  WBC 5.5  HGB 10.8*  HCT 35.5*  PLT 342   Recent Labs    03/29/21 0319  NA 134*  K 3.9  CL 101  CO2 26  GLUCOSE 121*  BUN 17  CREATININE 0.95  CALCIUM 8.9    Intake/Output Summary (Last 24 hours) at 03/29/2021 1258 Last data filed at 03/29/2021 0735 Gross per 24 hour  Intake 1176 ml  Output 1100 ml  Net 76 ml        Physical Exam: Vital Signs Blood pressure 124/74, pulse 72, temperature 98.1 F (36.7 C), temperature source Oral, resp. rate 18, height 5\' 7"  (1.702 m), weight 94.8 kg, SpO2 96 %. Constitutional: No distress . Vital signs reviewed. Obesity BMI 32.73 HENT: Normocephalic.  Atraumatic. Eyes: EOMI. No discharge. Cardiovascular: No JVD.  RRR. Respiratory: Normal effort.  No stridor.  Bilateral clear to auscultation. GI: Non-distended.  BS +. Skin: Warm and dry.  Abdominal wound CDI Psych: Normal mood.  Normal behavior. Musc: No edema in extremities.  No tenderness in extremities. Neuro: Alert  Motor: Grossly 4+-5/5 throughout  Assessment/Plan: 1. Functional deficits which require 3+ hours per day of interdisciplinary therapy in a comprehensive inpatient rehab setting. Physiatrist is providing close team supervision and 24 hour management of active medical problems listed below. Physiatrist and rehab team continue to assess barriers to discharge/monitor patient progress toward functional and medical goals  Care Tool:  Bathing    Body parts bathed by patient: Right arm, Left arm, Chest, Abdomen, Front perineal area, Buttocks, Right upper leg, Left upper leg, Right lower leg, Left lower leg, Face   Body parts bathed by helper: Front perineal area,  Buttocks     Bathing assist Assist Level: Supervision/Verbal cueing (sit<>stand at sink)     Upper Body Dressing/Undressing Upper body dressing   What is the patient wearing?: Pull over shirt    Upper body assist Assist Level: Set up assist    Lower Body Dressing/Undressing Lower body dressing      What is the patient wearing?: Pants     Lower body assist Assist for lower body dressing: Supervision/Verbal cueing     Toileting Toileting    Toileting assist Assist for toileting: Supervision/Verbal cueing     Transfers Chair/bed transfer  Transfers assist     Chair/bed transfer assist level: Contact Guard/Touching assist     Locomotion Ambulation   Ambulation assist      Assist level: Contact Guard/Touching assist Assistive device: Walker-rolling Max distance: 117   Walk 10 feet activity   Assist     Assist level: Contact Guard/Touching assist Assistive device: Walker-rolling   Walk 50 feet activity   Assist    Assist level: Contact Guard/Touching assist Assistive device: Walker-rolling    Walk 150 feet activity   Assist Walk 150 feet activity did not occur: Safety/medical concerns (fatigue, weakness, decreased balance)  Assist level: Contact Guard/Touching assist Assistive device: Walker-rolling  Walk 10 feet on uneven surface  activity   Assist Walk 10 feet on uneven surfaces activity did not occur: Safety/medical concerns (fatigue, weakness, decreased balance)         Wheelchair     Assist Is the patient using a wheelchair?: Yes Type of Wheelchair: Manual    Wheelchair assist level: Supervision/Verbal cueing Max wheelchair distance: >59'    Wheelchair 50 feet with 2 turns activity    Assist        Assist Level: Supervision/Verbal cueing   Wheelchair 150 feet activity     Assist      Assist Level: Supervision/Verbal cueing   Blood pressure 124/74, pulse 72, temperature 98.1 F (36.7 C),  temperature source Oral, resp. rate 18, height 5\' 7"  (1.702 m), weight 94.8 kg, SpO2 96 %.  Medical Problem List and Plan: 1.  Debility secondary to SBO with bowel perforation s/p NGT and TPN and persistent ileus and constipation on IV ABX for pelvic abscess  Continue CIR 2.  Impaired mobility: ambulating <300 feet: d/c Lovenox             -antiplatelet therapy: N/A 3. Headaches/Chronic neck pain/Pain Management: Takes gabapentin prn --continue oxycodone prn              Controlled on 11/19 with meds 4. Mood: LCSW to follow for evaluation and support.              -antipsychotic agents: N/a 5. Neuropsych: This patient is capable of making decisions on his own behalf. 6. Skin/Wound Care: Routine  pressure  relief measures 7. Fluids/Electrolytes/Nutrition: Monitor I/Os 8. SBO with ileus s/p lysis of adhesions: Continue wound care bid with bid wet to dry dressing changes - moistened plain guaze packing strips or 2 inch kerlix             Improving 9. Abdominal wall seroma: Penrose drain placed 11/17 and to be removed in 2 weeks in clinic. 10. Wound infection: On Cefotetan, Rocephin and Flagyl thorough 11/28             -- Repeat CT abdomen pelvis 11/24 and to consult ID for follow-up on antibiotic regimen. 11. HTN: Monitor BP TID  Controlled 11/21 12. T2DM: Hgb a1c- 6.0 and well controlled. Will monitor BS ac/hs and use SSI for elevated BS. Continue glargine insulin. --Used victoza 1.8, glucotrol and Jardiance. Relatively controlled on 11/21 13. Gout: Stable on colchicine.  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  15. Hyperkalemia: 1 dose kayexalate 11/18, 3.9 on 11.21, monitor as needed.  16. Hyponatremia: Na 134, monitor as needed.  17. Anemia: d/c Lovenox above given improved ambulation, monitor Hgb as needed.  18. Disposition: HFU scheduled. LOS: 4 days A FACE TO FACE EVALUATION WAS PERFORMED  12/18 Morton Simson 03/29/2021, 12:58 PM

## 2021-03-29 NOTE — Progress Notes (Signed)
Occupational Therapy Session Note  Patient Details  Name: Daniel Perkins MRN: [id number] Date of Birth: August 18, 1955  Today's Date: 03/29/2021 OT Individual Time: 9449-6759 OT Individual Time Calculation (min): 54 min    Short Term Goals: Week 1:  OT Short Term Goal 1 (Week 1): STG = LTG due to ELOS  Skilled Therapeutic Interventions/Progress Updates:  Pt greeted  supine in bed  agreeable to OT intervention. Session focus on BADL reeducation, functional mobility, increasing overall activity tolerance and ADL transfers . Pt completed bed mobility with supervision using bed rails to exit to R side of bed. CGA for sit<>stand from EOB with RW, CGA for ambulatory transfer from EOB>sink with RW. Pt completed bathing at sink with overall supervision able to sit<>stand from w/c to wash LB with supervision and unilateral support. Pt stood for 8 mins to complete LB bathing and groomign tasks, including shaving. Pt completed ambulatory transfer from sink >recliner with rw and CGA, pt completed x10 sit<>stands from recliner with supervision to increase LB strength and endurance for higher level functional mobility tasks.  pt left seated in recliner with chair alarm activated and all needs within reach.                   Therapy Documentation Precautions:  Precautions Precautions: Other (comment) Precaution Comments: abdominal precautions Restrictions Weight Bearing Restrictions: No Other Position/Activity Restrictions: no lifting >10lbs    Pain: pt reports no pain during session     Therapy/Group: Individual Therapy  Pollyann Glen [facility] 03/29/2021, 8:57 AM

## 2021-03-30 ENCOUNTER — Inpatient Hospital Stay: Payer: Medicare Other | Admitting: Infectious Diseases

## 2021-03-30 ENCOUNTER — Ambulatory Visit: Payer: Medicare Other | Admitting: Cardiovascular Disease

## 2021-03-30 LAB — GLUCOSE, CAPILLARY
Glucose-Capillary: 143 mg/dL — ABNORMAL HIGH (ref 70–99)
Glucose-Capillary: 113 mg/dL — ABNORMAL HIGH (ref 70–99)
Glucose-Capillary: 120 mg/dL — ABNORMAL HIGH (ref 70–99)
Glucose-Capillary: 121 mg/dL — ABNORMAL HIGH (ref 70–99)

## 2021-03-30 NOTE — Progress Notes (Signed)
Patient ID: Daniel Perkins, male   DOB: Jun 30, 1955, 65 y.o.   MRN: [id number]  Sw informed by therapy team patient potentially discharging 11/27 DME ordered through [employer].  NO OT follow up SW will send on [facility] orders for PT

## 2021-03-30 NOTE — Progress Notes (Signed)
Occupational Therapy Session Note  Patient Details  Name: Daniel Perkins MRN: [id number] Date of Birth: 03-Feb-1956  Today's Date: 03/30/2021 OT Individual Time:  -       Short Term Goals: Week 1:  OT Short Term Goal 1 (Week 1): STG = LTG due to ELOS Week 2:    Week 3:     Skilled Therapeutic Interventions/Progress Updates:    The pt was in his recliner upon arrival, he was able to complete a functional transfer from the recliner to standing with CGA, he was able to transfer from the recliner to the w/c with CGA incorporating the RW.  The pt was able to roll to the restroom for toileting incorporating his feet and hands for greater ease with MinA for maneuvering in tight quarters..  The pt was able to use the grab bars and the roller walker to come from sit to stand for LB clothing management. The pt was able to complete toileting with S only.  The pt was able to roll himself to the sink area a wash his hands independently.  The pt completed a bed transfer with 1 initial verbal cue on positioning for safe adherence to precautionary measures.  The pt was able to return to the w/c with CGA.  The pt complete UB theraex using his medium grade theraband underneath his foot for shld flexion  2 sets of 15 while adhering to abdominal precaution.. The pt returned to the recliner with CGA for lunch with his bed side table in place and all additional need addressed.  The pt had no c/o pain at the time of treatment.   Therapy Documentation Precautions:  Precautions Precautions: Other (comment) Precaution Comments: abdominal precautions Restrictions Weight Bearing Restrictions: No Other Position/Activity Restrictions: no lifting >10lbs General:   Vital Signs:   Pain:   Vision   Perception    Praxis   Exercises:   Other Treatments:     Therapy/Group: Individual Therapy  Lavona Mound 03/30/2021, 1:36 PM

## 2021-03-30 NOTE — Discharge Instructions (Addendum)
Inpatient Rehab Discharge Instructions  Tahjay Leeman Discharge date and time: 04/09/21   Activities/Precautions/ Functional Status: Activity: no lifting, driving, or strenuous exercise for till cleared by MD Diet: diabetic diet Wound Care: See below.   Functional status:  ___ No restrictions     ___ Walk up steps independently ___ 24/7 supervision/assistance   ___ Walk up steps with assistance _X__ Intermittent supervision/assistance  _X__ Bathe/dress independently ___ Walk with walker     ___ Bathe/dress with assistance ___ Walk Independently    ___ Shower independently ___ Walk with assistance    _X__ Shower with assistance _X__ No alcohol     ___ Return to work/school ________    Special Instructions:  COMMUNITY REFERRALS UPON DISCHARGE:    Home Health:   PT                    Agency: [facility]  Phone: [phone]  Medical Equipment/Items Ordered: Rollator, Bedside Commode, Transfer Bench                                                 Agency/Supplier: [employer].  My questions have been answered and I understand these instructions. I will adhere to these goals and the provided educational materials after my discharge from the hospital.  Patient/Caregiver Signature _______________________________ Date __________  Clinician Signature _______________________________________ Date __________  Please bring this form and your medication list with you to all your follow-up doctor's appointments.   [facility], [place] [phone]  OPEN ABDOMINAL SURGERY: POST OP INSTRUCTIONS  Always review your discharge instruction sheet given to you by the facility where your surgery was performed.  IF YOU HAVE DISABILITY OR FAMILY LEAVE FORMS, YOU MUST BRING THEM TO THE OFFICE FOR PROCESSING.  PLEASE DO NOT GIVE THEM TO YOUR DOCTOR.  A prescription for pain medication may be given to you upon discharge.  Take your pain medication as prescribed, if needed.  If  narcotic pain medicine is not needed, then you may take acetaminophen (Tylenol) or ibuprofen (Advil) as needed. Take your usually prescribed medications unless otherwise directed. If you need a refill on your pain medication, please contact your pharmacy. They will contact our office to request authorization.  Prescriptions will not be filled after 5pm or on week-ends. You should follow a light diet the first few days after arrival home, such as soup and crackers, pudding, etc.unless your doctor has advised otherwise. A high-fiber, low fat diet can be resumed as tolerated.   Be sure to include lots of fluids daily. Most patients will experience some swelling and bruising on the chest and neck area.  Ice packs will help.  Swelling and bruising can take several days to resolve Most patients will experience some swelling and bruising in the area of the incision. Ice pack will help. Swelling and bruising can take several days to resolve..  It is common to experience some constipation if taking pain medication after surgery.  Increasing fluid intake and taking a stool softener will usually help or prevent this problem from occurring.  A mild laxative (Milk of Magnesia or Miralax) should be taken according to package directions if there are no bowel movements after 48 hours.  You may have steri-strips (small skin tapes) in place directly over the incision.  These strips should be left on  the skin for 7-10 days.  If your surgeon used skin glue on the incision, you may shower in 24 hours.  The glue will flake off over the next 2-3 weeks.  Any sutures or staples will be removed at the office during your follow-up visit. You may find that a light gauze bandage over your incision may keep your staples from being rubbed or pulled. You may shower and replace the bandage daily. ACTIVITIES:  You may resume regular (light) daily activities beginning the next day--such as daily self-care, walking, climbing stairs--gradually  increasing activities as tolerated.  You may have sexual intercourse when it is comfortable.  Refrain from any heavy lifting or straining until approved by your doctor. You may drive when you no longer are taking prescription pain medication, you can comfortably wear a seatbelt, and you can safely maneuver your car and apply brakes Return to Work: ___________________________________ Bonita Quin should see your doctor in the office for a follow-up appointment approximately two weeks after your surgery.  Make sure that you call for this appointment within a day or two after you arrive home to insure a convenient appointment time. OTHER INSTRUCTIONS:  _____________________________________________________________ _____________________________________________________________  WHEN TO CALL YOUR DOCTOR: Fever over 101.0 Inability to urinate Nausea and/or vomiting Extreme swelling or bruising Continued bleeding from incision. Increased pain, redness, or drainage from the incision. Difficulty swallowing or breathing Muscle cramping or spasms. Numbness or tingling in hands or feet or around lips.  The clinic staff is available to answer your questions during regular business hours.  Please don't hesitate to call and ask to speak to one of the nurses if you have concerns.  For further questions, please visit [email or web address]  Wet to Dry WOUND CARE: - Change dressing daily - Supplies: sterile saline, gauze, scissors, tape  Remove dressing and all packing carefully, moistening with sterile saline as needed to avoid packing/internal dressing sticking to the wound. 2.   Clean edges of skin around the wound with water/gauze, making sure there is no tape debris or leakage left on skin that could cause skin irritation or breakdown. 3.   Dampen and clean gauze with sterile saline and pack wound from wound base to skin level loosely, making sure to take note of any possible areas of wound tracking,  tunneling and packing appropriately. Wound can be packed loosely. Trim to size as needed 4.   Cover wound with a dry gauze and secure with tape.  5.   Write the date/time on the dry dressing/tape to better track when the last dressing change occurred. - change dressing as needed if leakage occurs, wound gets contaminated, or patient requests to shower. - You may shower daily with wound open and following the shower the wound should be dried and a clean dressing placed.  - Medical grade tape as well as packing supplies can be found at [employer] on [address] or [employer] on [address]. The remaining supplies can be found at your local drug store, [employer] etc.

## 2021-03-30 NOTE — Progress Notes (Signed)
Patient ID: Daniel Perkins, male   DOB: [birth year], 65 y.o.   MRN: [id number]  Referral sent to [facility]

## 2021-03-30 NOTE — Progress Notes (Addendum)
Patient ID: Daniel Perkins, male   DOB: 01/13/1956, 65 y.o.   MRN: [id number]  Patient referral sent to [facility]  Pt declined

## 2021-03-30 NOTE — Progress Notes (Addendum)
Patient ID: Daniel Perkins, male   DOB: 03-16-1956, 65 y.o.   MRN: [id number]  Pt HH orders sent to [facility].  Pt declined due to insurance

## 2021-03-30 NOTE — Progress Notes (Signed)
PROGRESS NOTE   Subjective/Complaints: Pt on phone, therapy about to shower him. No complaints except for feeling pulling from time to time along abdominal wound.   ROS: Patient denies fever, rash, sore throat, blurred vision, nausea, vomiting, diarrhea, cough, shortness of breath or chest pain, joint or back pain, headache, or mood change.   Objective:   No results found. Recent Labs    03/29/21 0319  WBC 5.5  HGB 10.8*  HCT 35.5*  PLT 342   Recent Labs    03/29/21 0319  NA 134*  K 3.9  CL 101  CO2 26  GLUCOSE 121*  BUN 17  CREATININE 0.95  CALCIUM 8.9    Intake/Output Summary (Last 24 hours) at 03/30/2021 0858 Last data filed at 03/30/2021 0854 Gross per 24 hour  Intake 700 ml  Output 1000 ml  Net -300 ml        Physical Exam: Vital Signs Blood pressure 126/78, pulse 74, temperature 98.8 F (37.1 C), temperature source Oral, resp. rate 16, height 5\' 7"  (1.702 m), weight 94.8 kg, SpO2 98 %. Constitutional: No distress . Vital signs reviewed. HEENT: NCAT, EOMI, oral membranes moist Neck: supple Cardiovascular: RRR without murmur. No JVD    Respiratory/Chest: CTA Bilaterally without wheezes or rales. Normal effort    GI/Abdomen: BS +, non-tender, non-distended Ext: no clubbing, cyanosis, or edema Psych: pleasant and cooperative  Skin: Warm and dry.  Abdominal wound with central area packed, appears to be granulating in Musc: No edema in extremities.  No tenderness in extremities. Neuro: Alert  Motor: Grossly 4+-5/5 throughout  Assessment/Plan: 1. Functional deficits which require 3+ hours per day of interdisciplinary therapy in a comprehensive inpatient rehab setting. Physiatrist is providing close team supervision and 24 hour management of active medical problems listed below. Physiatrist and rehab team continue to assess barriers to discharge/monitor patient progress toward functional and medical  goals  Care Tool:  Bathing    Body parts bathed by patient: Right arm, Left arm, Chest, Abdomen, Front perineal area, Buttocks, Right upper leg, Left upper leg, Right lower leg, Left lower leg, Face   Body parts bathed by helper: Front perineal area, Buttocks     Bathing assist Assist Level: Supervision/Verbal cueing (sit<>stand at sink)     Upper Body Dressing/Undressing Upper body dressing   What is the patient wearing?: Pull over shirt    Upper body assist Assist Level: Set up assist    Lower Body Dressing/Undressing Lower body dressing      What is the patient wearing?: Pants     Lower body assist Assist for lower body dressing: Supervision/Verbal cueing     Toileting Toileting    Toileting assist Assist for toileting: Supervision/Verbal cueing     Transfers Chair/bed transfer  Transfers assist     Chair/bed transfer assist level: Contact Guard/Touching assist     Locomotion Ambulation   Ambulation assist      Assist level: Minimal Assistance - Patient > 75% Assistive device: Rollator Max distance: 115ft   Walk 10 feet activity   Assist     Assist level: Minimal Assistance - Patient > 75% Assistive device: Rollator   Walk 50 feet  activity   Assist    Assist level: Minimal Assistance - Patient > 75% Assistive device: Rollator    Walk 150 feet activity   Assist Walk 150 feet activity did not occur: Safety/medical concerns (fatigue, weakness, decreased balance)  Assist level: Minimal Assistance - Patient > 75% Assistive device: Rollator    Walk 10 feet on uneven surface  activity   Assist Walk 10 feet on uneven surfaces activity did not occur: Safety/medical concerns (fatigue, weakness, decreased balance)         Wheelchair     Assist Is the patient using a wheelchair?: Yes Type of Wheelchair: Manual    Wheelchair assist level: Supervision/Verbal cueing Max wheelchair distance: >150'    Wheelchair 50 feet with  2 turns activity    Assist        Assist Level: Supervision/Verbal cueing   Wheelchair 150 feet activity     Assist      Assist Level: Supervision/Verbal cueing   Blood pressure 126/78, pulse 74, temperature 98.8 F (37.1 C), temperature source Oral, resp. rate 16, height 5\' 7"  (1.702 m), weight 94.8 kg, SpO2 98 %.  Medical Problem List and Plan: 1.  Debility secondary to SBO with bowel perforation s/p NGT and TPN and persistent ileus and constipation on IV ABX for pelvic abscess  -Continue CIR therapies including PT, OT  2.  Impaired mobility: ambulating <300 feet: d/c Lovenox             -antiplatelet therapy: N/A 3. Headaches/Chronic neck pain/Pain Management: Takes gabapentin prn --continue oxycodone prn              Controlled on 11/22 with meds 4. Mood: LCSW to follow for evaluation and support.              -antipsychotic agents: N/a 5. Neuropsych: This patient is capable of making decisions on his own behalf. 6. Skin/Wound Care: Routine  pressure  relief measures 7. Fluids/Electrolytes/Nutrition: Monitor I/Os 8. SBO with ileus s/p lysis of adhesions: Continue wound care bid with bid wet to dry dressing changes - moistened plain guaze packing strips or 2 inch kerlix             -11/22 might be time to change aquacel dressing 9. Abdominal wall seroma: Penrose drain placed 11/17 and to be removed in 2 weeks in clinic. 10. Wound infection: On Cefotetan, Rocephin and Flagyl thorough 11/28             -- Repeat CT abdomen pelvis 11/24 and to consult ID for follow-up on antibiotic regimen--will order today. 11. HTN: Monitor BP TID  Controlled 11/22 12. T2DM: Hgb a1c- 6.0 and well controlled. Will monitor BS ac/hs and use SSI for elevated BS. Continue glargine insulin. --Used victoza 1.8, glucotrol and Jardiance. Controlled on 11/21 13. Gout: Stable on colchicine.  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  15.  Hyperkalemia: 1 dose kayexalate 11/18, 3.9 on 11.21, monitor as needed.  16. Hyponatremia: Na 134, monitor as needed.  17. Anemia: d/c Lovenox above given improved ambulation, monitor Hgb as needed.  18. Disposition: HFU scheduled.   LOS: 5 days A FACE TO FACE EVALUATION WAS PERFORMED  12/18 03/30/2021, 8:58 AM

## 2021-03-30 NOTE — Progress Notes (Signed)
Physical Therapy Session Note  Patient Details  Name: Daniel Perkins MRN: [id number] Date of Birth: [birth year]  Today's Date: 03/30/2021 PT Individual Time: 0930-1040 and 2751-7001 PT Individual Time Calculation (min): 70 min and 24 min  Short Term Goals: Week 1:  PT Short Term Goal 1 (Week 1): STG=LTG due to LOS  Skilled Therapeutic Interventions/Progress Updates:   Treatment Session 1 Received pt sitting in recliner, pt agreeable to PT treatment, and denied any pain during session just mild pulling in abdomen at incision site. Session with emphasis on functional mobility, generalized strengthening, dynamic standing balance/coordination, gait training, and improved endurance with activity. Pt performed all sit<>stands with rollator and CGA throughout session. Pt ambulated 163ft x 1, 27ft x 1, and 170ft x 1 using rollator and CGA/light min A to 4W dayroom with 1 standing rest break on elevator and 1 seated rest break on rollator. Pt demonstrates good rollator safety awareness and did not require any cues for brake management. Discussed D/C date and ELOS and pt ultimately requesting to D/C on Sunday/Monday - will notify treatment team. Pt then performed BUE/LE strengthening on Nustep at workload 5 for 8 minutes for a total of 450 steps with emphasis on cardiovascular endurance. Pt performed the following exercises standing with BUE support on table in dayroom with CGA and emphasis on LE strength and balance: -single leg marches 3x10 bilaterally with 2.5lb ankle weight -single leg hip extension 3x10 bilaterally with 2.5lb ankle weight -single leg hip abduction 3x10 bilaterally with 2.5lb ankle weight -mini-squats 3x10 -single leg hamstring curls 3x10 bilaterally with 1lb ankle weight  Pt then ambulated additional 17ft with rollator and CGA weaving in/out of cones x 2 trials with emphasis on navigating tight turns and spaces - no LOB but required cues to stay close to rollator. Pt ambulated  additional 234ft x 1 and 129ft x 1 using rollator and CGA back to room with 1 seated rest break on rollator and 1 standing rest break on elevator. Concluded session with pt sitting in recliner, needs within reach, and chair pad alarm on.   Treatment Session 2 Received pt sitting in recliner on the phone, pt agreeable to PT treatment, and denied any pain during session. Session with emphasis on functional mobility, gait training, and improved endurance with activity. Pt performed all sit<>stands with rollator and CGA throughout session. Pt ambulated 27ft x 2 trials with rollator and CGA down to end of [employer] and back. Pt demonstrates flexed trunk, with increased UE reliance, and decreased bilateral foot clearance - cues to push through LEs rather than relying on UEs and to remain close to rollator. Concluded session with pt sitting in recliner, needs within reach, and chair pad alarm on.   Therapy Documentation Precautions:  Precautions Precautions: Other (comment) Precaution Comments: abdominal precautions Restrictions Weight Bearing Restrictions: No Other Position/Activity Restrictions: no lifting >10lbs  Therapy/Group: Individual Therapy Martin Majestic PT, DPT   03/30/2021, 6:29 AM

## 2021-03-30 NOTE — Progress Notes (Signed)
Occupational Therapy Session Note  Patient Details  Name: Daniel Perkins MRN: [id number] Date of Birth: [birth year]  Today's Date: 03/30/2021 OT Individual Time: 1478-2956 OT Individual Time Calculation (min): 54 min    Short Term Goals: Week 1:  OT Short Term Goal 1 (Week 1): STG = LTG due to ELOS  Skilled Therapeutic Interventions/Progress Updates:  Skilled OT intervention completed with focus on ADL retraining. Pt received in long sitting in bed, asleep, easily aroused and agreeable to session. Opted to complete self-care via full shower today. Bed mobility with supervision for long sitting > seated EOB. Completed sit > stand and then ambulatory transfer using RW with supervision, to TTB in shower. Pt able to doff clothing with supervision. MD in room for AM round- advised not to cover wound with plastic with RN to change dressing after shower. Covered PICC line, and provided education on covering/wrapping technique for full showers if abdominal wound/dressing needs to be covered upon d/c. Pt was set up for bathing in shower, with safety techniques for showering provided. Donned shoes and shirt with supervision, then sit > stand and side step using grab bar to RW with CGA, then ambulatory transfer to seated EOB with supervision. Completed donning of grip socks, threading pants and both shoes, with supervision using figure four position. Educated on energy conservation strategies for showering due to decreased endurance, as well as option of AE for LB dressing. Plan to introduce AE next session. Pt completed grooming tasks standing at sink with UE support on counter for balance, but able to stand for about 5 mins total. Pt left seated in recliner, with RN in room for dressing change, and all needs in reach at end of session.  Therapy Documentation Precautions:  Precautions Precautions: Other (comment) Precaution Comments: abdominal precautions Restrictions Weight Bearing Restrictions:  No Other Position/Activity Restrictions: no lifting >10lbs  Pain: No c/o pain   Therapy/Group: Individual Therapy  Melquiades Kovar E Daquon Greenleaf 03/30/2021, 7:29 AM

## 2021-03-31 LAB — GLUCOSE, CAPILLARY
Glucose-Capillary: 127 mg/dL — ABNORMAL HIGH (ref 70–99)
Glucose-Capillary: 103 mg/dL — ABNORMAL HIGH (ref 70–99)
Glucose-Capillary: 110 mg/dL — ABNORMAL HIGH (ref 70–99)
Glucose-Capillary: 134 mg/dL — ABNORMAL HIGH (ref 70–99)

## 2021-03-31 MED ORDER — ACETAMINOPHEN 325 MG PO TABS
650.0000 mg | ORAL_TABLET | Freq: Three times a day (TID) | ORAL | Status: DC
  Administered 2021-03-31 – 2021-04-09 (×27): 650 mg via ORAL
  Filled 2021-03-31 (×27): qty 2

## 2021-03-31 NOTE — Progress Notes (Signed)
Occupational Therapy Discharge Summary  Patient Details  Name: Daniel Perkins MRN: [id number] Date of Birth: 08-21-1955   Patient has met 7 of 7 long term goals due to improved activity tolerance, improved balance, postural control, ability to compensate for deficits, and improved awareness.  Patient to discharge at overall Modified Independent level.  Patient's care partner is independent to provide the necessary supervision for basic ADLs to occasional min assistance for higher level tasks at discharge.    Reasons goals not met: N/a  Recommendation:  No OT f/u recommended  Equipment: BSC and TTB  Reasons for discharge: treatment goals met  Patient/family agrees with progress made and goals achieved: Yes  OT Discharge Precautions/Restrictions  Precautions Precautions: Other (comment) Precaution Comments: abdominal precautions Restrictions Weight Bearing Restrictions: No Other Position/Activity Restrictions: no lifting >10lbs ADL ADL Equipment Provided: Long-handled sponge Eating: Independent Where Assessed-Eating: Chair Grooming: Supervision/safety (RW) Where Assessed-Grooming: Standing at sink Upper Body Bathing: Modified independent Where Assessed-Upper Body Bathing: Shower Lower Body Bathing: Modified independent Where Assessed-Lower Body Bathing: Shower Upper Body Dressing: Modified independent (Device) Where Assessed-Upper Body Dressing: Edge of bed Lower Body Dressing: Modified independent Where Assessed-Lower Body Dressing: Edge of bed Toileting: Modified independent Where Assessed-Toileting: Bedside Commode Toilet Transfer: Modified independent Toilet Transfer Method: Ambulating (RW) Science writer: Bedside commode, Grab bars Tub/Shower Transfer: Modified independent Clinical cytogeneticist Method: Optometrist: Radio broadcast assistant, Energy manager: Modified independent Social research officer, government Method:  Heritage manager: Radio broadcast assistant, Grab bars Vision Baseline Vision/History: 0 No visual deficits Patient Visual Report: No change from baseline Vision Assessment?: No apparent visual deficits Perception  Perception: Within Functional Limits Praxis Praxis: Intact Cognition Overall Cognitive Status: Within Functional Limits for tasks assessed Arousal/Alertness: Awake/alert Orientation Level: Oriented X4 Year: 2022 Month: November Day of Week: Correct Memory: Appears intact Immediate Memory Recall: Sock;Bed;Blue Memory Recall Sock: Without Cue Memory Recall Blue: Without Cue Memory Recall Bed: Without Cue Awareness: Appears intact Problem Solving: Appears intact Safety/Judgment: Appears intact Sensation Sensation Light Touch: Appears Intact Hot/Cold: Appears Intact Proprioception: Appears Intact Stereognosis: Appears Intact Coordination Gross Motor Movements are Fluid and Coordinated: No Fine Motor Movements are Fluid and Coordinated: Yes Finger Nose Finger Test: WFL but decreased precision on L Motor  Motor Motor: Within Functional Limits Motor - Skilled Clinical Observations: generalized weakness/deconditioning; improved since eval Mobility  Bed Mobility Bed Mobility: Rolling Right;Rolling Left;Supine to Sit;Sit to Supine Rolling Right: Independent with assistive device Rolling Left: Independent with assistive device Supine to Sit: Independent with assistive device Sit to Supine: Independent with assistive device Transfers Sit to Stand: Independent with assistive device Stand to Sit: Independent with assistive device  Trunk/Postural Assessment  Cervical Assessment Cervical Assessment: Exceptions to [facility] (forward head) Thoracic Assessment Thoracic Assessment: Exceptions to [facility] (mild kyphosis) Lumbar Assessment Lumbar Assessment: Exceptions to [facility] (posterior pelvic tilt) Postural Control Postural Control: Within Functional Limits   Balance Balance Balance Assessed: Yes Standardized Balance Assessment Standardized Balance Assessment: Berg Balance Test Berg Balance Test Sit to Stand: Able to stand  independently using hands Standing Unsupported: Able to stand safely 2 minutes Sitting with Back Unsupported but Feet Supported on Floor or Stool: Able to sit safely and securely 2 minutes Stand to Sit: Sits safely with minimal use of hands Transfers: Able to transfer safely, minor use of hands Standing Unsupported with Eyes Closed: Able to stand 10 seconds safely Standing Ubsupported with Feet Together: Able to place feet together independently and stand 1 minute safely From  Standing, Reach Forward with Outstretched Arm: Can reach forward >5 cm safely (2") From Standing Position, Pick up Object from Floor: Able to pick up shoe, needs supervision From Standing Position, Turn to Look Behind Over each Shoulder: Looks behind one side only/other side shows less weight shift Turn 360 Degrees: Able to turn 360 degrees safely but slowly Standing Unsupported, Alternately Place Feet on Step/Stool: Able to stand independently and complete 8 steps >20 seconds Standing Unsupported, One Foot in Front: Able to plae foot ahead of the other independently and hold 30 seconds Standing on One Leg: Able to lift leg independently and hold 5-10 seconds Total Score: 46 Static Sitting Balance Static Sitting - Balance Support: Feet supported;Bilateral upper extremity supported Static Sitting - Level of Assistance: 7: Independent Dynamic Sitting Balance Dynamic Sitting - Balance Support: Feet supported;No upper extremity supported Dynamic Sitting - Level of Assistance: 6: Modified independent (Device/Increase time) Static Standing Balance Static Standing - Balance Support: Bilateral upper extremity supported (rollator) Static Standing - Level of Assistance: 6: Modified independent (Device/Increase time) Dynamic Standing Balance Dynamic Standing  - Balance Support: Bilateral upper extremity supported (rollator) Dynamic Standing - Level of Assistance: 6: Modified independent (Device/Increase time) Extremity/Trunk Assessment RUE Assessment RUE Assessment: Within Functional Limits General Strength Comments: 4+/5 grossly LUE Assessment LUE Assessment: Within Functional Limits General Strength Comments: 4+/5 grossly   Albaro Deviney E Martrell Eguia 03/31/2021, 4:11 PM

## 2021-03-31 NOTE — Progress Notes (Signed)
Occupational Therapy Session Note  Patient Details  Name: Daniel Perkins MRN: [id number] Date of Birth: 1955-05-12  Today's Date: 03/31/2021 OT Individual Time: 4010-2725 OT Individual Time Calculation (min): 68 min    Short Term Goals: Week 1:  OT Short Term Goal 1 (Week 1): STG = LTG due to ELOS  Skilled Therapeutic Interventions/Progress Updates:  Skilled OT intervention completed with focus on self-care tasks, activity tolerance, functional ambulation and home/kitchen management safety education. Pt received seated in recliner, agreeable to session. Sit > stand using rollator with CGA, then ambulatory transfer to sink for grooming tasks completed in standing with counter top for balance and total of about 5 mins in stance. Pt required cues on safe positioning of rollator to side or behind pt for grooming tasks vs being in front and leaning forward for teeth brushing. Ambulated with rollator and CGA from [facility] room to elevator then down to 39M mock apartment consecutively. Pt required seated rest break upon entry to apartment. Ambulated through kitchen with supervision using rollator to simulate gathering items for cooking task. Discussed safe strategies for transporting items, as well as for setting up the kitchen to promote pt's safety. Pt able to self-prompt sitting on rollator to reach into bottom fridge drawer to retrieve item, with good safety demonstrated. Ambulatory transfer to bed in apartment for bed making task and folding laundry for increasing activity tolerance. Educated on energy conservation strategies for IADL tasks which pt reports he will being doing upon d/c. Pt with good safety awareness and positioning throughout, requiring CGA-supervision throughout. Ambulatory transfer from mock-apartment to elevators then seated rest break needed on 5C floor due to BUE fatigue. C/o fatigue only in BUEs vs legs due to pt's reliance on rollator for posture and stability. Re-educated on  importance of energy conservation and leaving enough "fuel in the tank" to get where he needs to safely. Pt able to finish ambulating to pt's room with CGA. Participated in BUE strengthening exercises to promote BUE endurance needed for functional ambulation with rollator and ADL tasks, including the following with green theraband:  Bicep curls- x20 each arm Chest presses- x30 total  Demonstration and cues required for form and technique. Pt left seated in w/c, with chair alarm on, and all needs in reach at end of session.  Therapy Documentation Precautions:  Precautions Precautions: Other (comment) Precaution Comments: abdominal precautions Restrictions Weight Bearing Restrictions: No Other Position/Activity Restrictions: no lifting >10lbs  Pain: No c/o pain   Therapy/Group: Individual Therapy  Tya Haughey E Shubham Thackston 03/31/2021, 7:30 AM

## 2021-03-31 NOTE — Progress Notes (Signed)
Conference time was actually 03/31/2021, 11:30 AM. Entered incorrect time by error.  Kennyth Arnold, RN3, BSN, CBIS, CRRN, [facility], Inpatient Rehabilitation Office [phone] Cell [phone]

## 2021-03-31 NOTE — Progress Notes (Signed)
Occupational Therapy Session Note  Patient Details  Name: Daniel Perkins MRN: [id number] Date of Birth: 22-Jul-1955  Today's Date: 03/31/2021 OT Individual Time: 8546-2703 OT Individual Time Calculation (min): 48 min    Short Term Goals: Week 1:  OT Short Term Goal 1 (Week 1): STG = LTG due to ELOS  Skilled Therapeutic Interventions/Progress Updates:  Skilled OT intervention completed with focus on ADL retraining, functional ambulation and activity tolerance. Pt received seated in recliner, agreeable to session. Requested assist to shave face. Sit > stand with supervision, then ambulatory transfer to sink using rollator with CGA. Pt seated on rollator seat for seated shaving and grooming tasks with supervision assist. Education provided on being cautious during shaving as to not knick the skin, because of being on blood thinner meds, however pt demonstrated good use and safety awareness throughout. Pt's daughter called while pt shaving, with therapist answering questions and providing family education via phone and daughter receptive. CSW number provided for daughter to f/u with about DME and d/c process. Pt with request to work on ambulation, with pt able to sit > stand from rollator using counter as balance, with CGA, then turn self around to face rollator. Completed about 150 ft of functional ambulation using rollator and CGA to promote increased activity tolerance needed for self-care tasks, then required seated rest break on rollator seat. Energy conservation strategies educated on, and discussed how returning to yard work activities are not appropriate at this time due to the lifting restrictions per the abdominal precautions. Pt able to ambulate about 150 ft back to room with same assist and cues needed to bring rollator closer to self, with pt demonstrating fatigue. Pt left seated in recliner, with chair alarm on, fresh ice water per request, and all needs in reach at end of  session.  Therapy Documentation Precautions:  Precautions Precautions: Other (comment) Precaution Comments: abdominal precautions Restrictions Weight Bearing Restrictions: No Other Position/Activity Restrictions: no lifting >10lbs  Pain: No c/o pain   Therapy/Group: Individual Therapy  Faylinn Schwenn E Piya Mesch 03/31/2021, 7:31 AM

## 2021-03-31 NOTE — Progress Notes (Signed)
PROGRESS NOTE   Subjective/Complaints: Pt in good spirits. Reports an ongoing headache (since acute) which is in the right temple area. Tylenol helps somewhat. Oxycodone more effective. He denies any other associated symptoms. He's having daily  ROS: Patient denies fever, rash, sore throat, blurred vision, nausea, vomiting, diarrhea, cough, shortness of breath or chest pain, joint or back pain, headache, or mood change.   Objective:   No results found. Recent Labs    03/29/21 0319  WBC 5.5  HGB 10.8*  HCT 35.5*  PLT 342   Recent Labs    03/29/21 0319  NA 134*  K 3.9  CL 101  CO2 26  GLUCOSE 121*  BUN 17  CREATININE 0.95  CALCIUM 8.9    Intake/Output Summary (Last 24 hours) at 03/31/2021 0904 Last data filed at 03/31/2021 0730 Gross per 24 hour  Intake 840 ml  Output 1500 ml  Net -660 ml        Physical Exam: Vital Signs Blood pressure 108/69, pulse 79, temperature 98.5 F (36.9 C), temperature source Oral, resp. rate 18, height 5\' 7"  (1.702 m), weight 88.9 kg, SpO2 99 %. Constitutional: No distress . Vital signs reviewed. HEENT: NCAT, EOMI, oral membranes moist Neck: supple Cardiovascular: RRR without murmur. No JVD    Respiratory/Chest: CTA Bilaterally without wheezes or rales. Normal effort    GI/Abdomen: BS +, non-tender, non-distended Ext: no clubbing, cyanosis, or edema Psych: pleasant and cooperative  Skin: Warm and dry.  Abdominal wound with central area packed, appears to be granulating in but a little too dry? Musc: No edema in extremities.  No tenderness in extremities. No pain in his right temporal area with palpation Neuro: Alert . No cn findings.  Motor: Grossly 4+-5/5 throughout. No focal sensory  Assessment/Plan: 1. Functional deficits which require 3+ hours per day of interdisciplinary therapy in a comprehensive inpatient rehab setting. Physiatrist is providing close team supervision  and 24 hour management of active medical problems listed below. Physiatrist and rehab team continue to assess barriers to discharge/monitor patient progress toward functional and medical goals  Care Tool:  Bathing    Body parts bathed by patient: Right arm, Left arm, Chest, Abdomen, Front perineal area, Buttocks, Right upper leg, Left upper leg, Right lower leg, Left lower leg, Face   Body parts bathed by helper: Front perineal area, Buttocks     Bathing assist Assist Level: Set up assist     Upper Body Dressing/Undressing Upper body dressing   What is the patient wearing?: Pull over shirt    Upper body assist Assist Level: Set up assist    Lower Body Dressing/Undressing Lower body dressing      What is the patient wearing?: Pants     Lower body assist Assist for lower body dressing: Supervision/Verbal cueing     Toileting Toileting    Toileting assist Assist for toileting: Supervision/Verbal cueing     Transfers Chair/bed transfer  Transfers assist     Chair/bed transfer assist level: Contact Guard/Touching assist     Locomotion Ambulation   Ambulation assist      Assist level: Contact Guard/Touching assist Assistive device: Rollator Max distance: >128ft   Walk  10 feet activity   Assist     Assist level: Contact Guard/Touching assist Assistive device: Rollator   Walk 50 feet activity   Assist    Assist level: Contact Guard/Touching assist Assistive device: Rollator    Walk 150 feet activity   Assist Walk 150 feet activity did not occur: Safety/medical concerns (fatigue, weakness, decreased balance)  Assist level: Contact Guard/Touching assist Assistive device: Rollator    Walk 10 feet on uneven surface  activity   Assist Walk 10 feet on uneven surfaces activity did not occur: Safety/medical concerns (fatigue, weakness, decreased balance)         Wheelchair     Assist Is the patient using a wheelchair?: Yes Type of  Wheelchair: Manual    Wheelchair assist level: Supervision/Verbal cueing Max wheelchair distance: >150'    Wheelchair 50 feet with 2 turns activity    Assist        Assist Level: Supervision/Verbal cueing   Wheelchair 150 feet activity     Assist      Assist Level: Supervision/Verbal cueing   Blood pressure 108/69, pulse 79, temperature 98.5 F (36.9 C), temperature source Oral, resp. rate 18, height 5\' 7"  (1.702 m), weight 88.9 kg, SpO2 99 %.  Medical Problem List and Plan: 1.  Debility secondary to SBO with bowel perforation s/p NGT and TPN and persistent ileus and constipation on IV ABX for pelvic abscess  -Continue CIR therapies including PT, OT, team conf 2.  Impaired mobility: ambulating <300 feet: d/c Lovenox             -antiplatelet therapy: N/A 3. Headaches/Chronic neck pain/Pain Management: Takes gabapentin prn --continue oxycodone prn              -11/23 will schedule tylenol for headaches. Appear to be tension type. 4. Mood: LCSW to follow for evaluation and support.              -antipsychotic agents: N/a 5. Neuropsych: This patient is capable of making decisions on his own behalf. 6. Skin/Wound Care: Routine  pressure  relief measures 7. Fluids/Electrolytes/Nutrition: Monitor I/Os 8. SBO with ileus s/p lysis of adhesions: Continue wound care bid with bid wet to dry dressing changes - moistened plain guaze packing strips or 2 inch kerlix             -11/23 will change abd wound dressing to calcium alginate 9. Abdominal wall seroma: Penrose drain placed 11/17 and to be removed in 2 weeks in clinic. 10. Wound infection: On Cefotetan, Rocephin and Flagyl thorough 11/28             -- Repeat CT abdomen pelvis 11/24 and to consult ID for follow-up on antibiotic regimen--Have ordered 11. HTN: Monitor BP TID  Controlled 11/22 12. T2DM: Hgb a1c- 6.0 and well controlled. Will monitor BS ac/hs and use SSI for elevated BS. Continue glargine insulin. --Used  victoza 1.8, glucotrol and Jardiance. Controlled on 11/23 13. Gout: Stable on colchicine.  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  15. Hyperkalemia: 1 dose kayexalate 11/18, 3.9 on 11.21, monitor as needed.  16. Hyponatremia: Na 134, monitor as needed.  17. Anemia: d/c Lovenox above given improved ambulation, monitor Hgb as needed.  18. Disposition: HFU scheduled.   LOS: 6 days A FACE TO FACE EVALUATION WAS PERFORMED  12/18 03/31/2021, 9:04 AM

## 2021-03-31 NOTE — Progress Notes (Signed)
Physical Therapy Session Note  Patient Details  Name: Daniel Perkins MRN: [id number] Date of Birth: 05-08-1956  Today's Date: 03/31/2021 PT Individual Time: 0815-0925 PT Individual Time Calculation (min): 70 min   Short Term Goals: Week 1:  PT Short Term Goal 1 (Week 1): STG=LTG due to LOS  Skilled Therapeutic Interventions/Progress Updates:   Received pt semi-reclined in bed, pt agreeable to PT treatment, and denied any pain during session just "stretching" along abdominal incision. Session with emphasis on functional mobility, dressing, toileting, generalized strengthening, dynamic standing balance/coordination, gait training, stair navigation, and improved activity tolerance. Pt transferred semi-reclined<>sitting EOB with HOB elevated and use of bedrails with supervision. Donned pull over shirt, sweatshirt, and pants sitting EOB with supervision and stood with CGA to pull pants over hips. Donned shoes in figure four position with set up assist and pt reported urge to use restroom. Pt ambulated 57ft x 2 trials with rollator and CGA to/from bathroom and able to manage clothing with close supervision. Pt continent of bowel and performed peri-care with supervision. Pt stood at sink and washed hands with close supervision, then ambulated 140ft x 2 trials with rollator and CGA to 4W therapy gym with 1 standing rest break on elevator. Worked on dynamic standing balance tossing unweighted ball, increasing to 1.1lb ball, against rebounder without UE support and CGA for balance for 1 minute x 3 trials with narrow BOS. Then worked on blocked practice sit<>stands from slightly elevated mat on [employer] without UE support for 1x11 and 1x20 reps to fatigue - cues for eccentric control when sitting down. Ambulated to/from staircase and navigated 12 steps with 2 rails and CGA (first 4 ascending and descending with a step through pattern). Cued pt to utilize step to pattern for safety and due to pt's decreased ankle  DF ROM from multiple surgeries. Pt ambulated additional 7ft using rollator and CGA to ortho gym and performed BUE strengthening on UBE at level 2 for 2 minutes forward and 2 minutes backwards. Pt ambulated additional 41ft x 1 and 122ft x 1 using rollator and CGA back to room. Concluded session with pt sitting in recliner, needs within reach, and chair pad alarm on. RN present at bedside attending to care.   Therapy Documentation Precautions:  Precautions Precautions: Other (comment) Precaution Comments: abdominal precautions Restrictions Weight Bearing Restrictions: No Other Position/Activity Restrictions: no lifting >10lbs  Therapy/Group: Individual Therapy Martin Majestic PT, DPT   03/31/2021, 6:27 AM

## 2021-03-31 NOTE — Progress Notes (Signed)
Patient ID: Daniel Perkins, male   DOB: March 06, 1956, 65 y.o.   MRN: [id number] Team Conference Report to Patient/Family  Team Conference discussion was reviewed with the patient and caregiver, including goals, any changes in plan of care and target discharge date.  Patient and caregiver express understanding and are in agreement.  The patient has a target discharge date of 04/06/21.  SW met with patient and family via telephone. Provided conference updates. Pt has no additional questions or concerns   Dyanne Iha 03/31/2021, 1:18 PM

## 2021-03-31 NOTE — Patient Care Conference (Signed)
Inpatient RehabilitationTeam Conference and Plan of Care Update Date: 03/31/2021   Time: 11:30 AM    Patient Name: Daniel Perkins      Medical Record Number: [id number]  Date of Birth: [birth year] Sex: Male         Room/Bed: 5C13C/5C13C-01 Payor Info: Payor: Multimedia programmer / Plan: [facility] MEDICARE / Product Type: *No Product type* /    Admit Date/Time:  03/25/2021  5:59 PM  Primary Diagnosis:  Debility  Hospital Problems: Principal Problem:   Debility Active Problems:   SBO (small bowel obstruction) (HCC)   Hyperkalemia   Essential hypertension   Diabetes mellitus type 2 in nonobese [facility])    Expected Discharge Date: Expected Discharge Date: 04/06/21  Team Members Present: Physician leading conference: Dr. Claudette Laws Social Worker Present: Lavera Guise, BSW Nurse Present: Kennyth Arnold, RN PT Present: Peter Congo, PT OT Present: Other (comment) [facility] Cheyenne Adas, OT) PPS Coordinator present : Fae Pippin, SLP     Current Status/Progress Goal Weekly Team Focus  Bowel/Bladder   continent b/b  remain continent  assess toileting needs q 2 hr and prn   Swallow/Nutrition/ Hydration             ADL's   Set up bathing, Set up UBD, supervision LBD, CGA toileting, CGA functional transfers  Mod I  Endurance, functional transfers, LB clothing with AE education, dynamic standing balance   Mobility   bed mobility supervision, transfers with rollator CGA, gait >152ft with rollator CGA/min A, 8 steps 2 rails min A, WC mobility >131ft supervision  Mod I  functional mobility, generalized strengthening, dynamic standing balance/coordination, gait training, NMR, endurance, D/C planning   Communication             Safety/Cognition/ Behavioral Observations            Pain   5/10 headache  < 3  assess pain q 4 hr and prn   Skin   abdominal wound, using Calcium Alginate and packing  no new breakdown  assess skin q shift and prn     Discharge Planning:   Discharging home with sister and daughter to provide PRN assistance   Team Discussion: Scheduled Tylenol 650 mg 3 x/day. Patient states Oxy IR helps more. Abdominal wound updated orders for Calcium Alginate with daily dressing change. Repeat CT of pelvis on 04/01/21. Social work reports patient is to discharge home alone. OT updated that now patient reports he is discharging home with his daughter. IV abx complete on 04/05/21 at 2200. Patient on target to meet rehab goals: yes, currently set up and contact guard. Has mod I goals and will reach goals.  *See Care Plan and progress notes for long and short-term goals.   Revisions to Treatment Plan:  New medication orders and dressing change orders.  Teaching Needs: Family education, medication/pain management, skin/wound care, safety awareness, transfer training, etc.  Current Barriers to Discharge: Home enviroment access/layout, IV antibiotics, Wound care, Lack of/limited family support, Weight, and Medication compliance  Possible Resolutions to Barriers: Family education Complete IV abx Wound care education Follow up PT/OT Recommended DME     Medical Summary Current Status: abdominal wound healing. improved pain control although still having headache. repeat CT this week to judge progress, abx need.  Barriers to Discharge: Medical stability   Possible Resolutions to Barriers/Weekly Focus: daily assessment of wounds and patient data.   Continued Need for Acute Rehabilitation Level of Care: The patient requires daily medical management by a physician with specialized training  in physical medicine and rehabilitation for the following reasons: Direction of a multidisciplinary physical rehabilitation program to maximize functional independence : Yes Medical management of patient stability for increased activity during participation in an intensive rehabilitation regime.: Yes Analysis of laboratory values and/or radiology reports with any  subsequent need for medication adjustment and/or medical intervention. : Yes   I attest that I was present, lead the team conference, and concur with the assessment and plan of the team.   Tennis Must 03/31/2021, 11:45 AM

## 2021-03-31 NOTE — Progress Notes (Signed)
Patient ID: Daniel Perkins, male   DOB: 09-01-1955, 65 y.o.   MRN: [id number]  DME ordered through [employer]  Lavera Guise, BSW [phone]

## 2021-04-01 ENCOUNTER — Inpatient Hospital Stay (HOSPITAL_COMMUNITY): Payer: Medicare Other

## 2021-04-01 LAB — GLUCOSE, CAPILLARY
Glucose-Capillary: 118 mg/dL — ABNORMAL HIGH (ref 70–99)
Glucose-Capillary: 89 mg/dL (ref 70–99)
Glucose-Capillary: 143 mg/dL — ABNORMAL HIGH (ref 70–99)
Glucose-Capillary: 101 mg/dL — ABNORMAL HIGH (ref 70–99)

## 2021-04-01 MED ORDER — ALTEPLASE 2 MG IJ SOLR
2.0000 mg | Freq: Once | INTRAMUSCULAR | Status: AC
  Administered 2021-04-01: 2 mg
  Filled 2021-04-01: qty 2

## 2021-04-01 MED ORDER — SODIUM CHLORIDE 0.9% FLUSH
10.0000 mL | Freq: Two times a day (BID) | INTRAVENOUS | Status: DC
  Administered 2021-04-02: 10 mL
  Administered 2021-04-02: 20 mL
  Administered 2021-04-03: 10 mL

## 2021-04-01 MED ORDER — IOHEXOL 300 MG/ML  SOLN
25.0000 mL | INTRAMUSCULAR | Status: AC
  Administered 2021-04-01 (×2): 25 mL via ORAL

## 2021-04-01 MED ORDER — IOHEXOL 300 MG/ML  SOLN
100.0000 mL | Freq: Once | INTRAMUSCULAR | Status: AC | PRN
  Administered 2021-04-01: 100 mL via INTRAVENOUS

## 2021-04-01 NOTE — Progress Notes (Signed)
PROGRESS NOTE   Subjective/Complaints:  No abd pain, has not had CT abd yet   ROS: Patient denies CP, SOB, N/V/D  Objective:   No results found. No results for input(s): WBC, HGB, HCT, PLT in the last 72 hours.  No results for input(s): NA, K, CL, CO2, GLUCOSE, BUN, CREATININE, CALCIUM in the last 72 hours.   Intake/Output Summary (Last 24 hours) at 04/01/2021 0913 Last data filed at 04/01/2021 0900 Gross per 24 hour  Intake 870 ml  Output 5025 ml  Net -4155 ml         Physical Exam: Vital Signs Blood pressure 114/78, pulse 73, temperature 98.7 F (37.1 C), temperature source Oral, resp. rate 14, height 5\' 7"  (1.702 m), weight 95.8 kg, SpO2 97 %.  General: No acute distress Mood and affect are appropriate Heart: Regular rate and rhythm no rubs murmurs or extra sounds Lungs: Clear to auscultation, breathing unlabored, no rales or wheezes Abdomen: Positive bowel sounds, soft nontender to palpation, nondistended Extremities: No clubbing, cyanosis, or edema  Skin: Warm and dry.  Abdominal wound with central area packed, 1cm packed area, no drainage or surrounding erythema /induration  Musc: No edema in extremities.  No tenderness in extremities. No pain in his right temporal area with palpation Neuro: Alert . No cn findings.  Motor: Grossly 4+-5/5 throughout. No focal sensory  Assessment/Plan: 1. Functional deficits which require 3+ hours per day of interdisciplinary therapy in a comprehensive inpatient rehab setting. Physiatrist is providing close team supervision and 24 hour management of active medical problems listed below. Physiatrist and rehab team continue to assess barriers to discharge/monitor patient progress toward functional and medical goals  Care Tool:  Bathing    Body parts bathed by patient: Right arm, Left arm, Chest, Abdomen, Front perineal area, Buttocks, Right upper leg, Left upper leg,  Right lower leg, Left lower leg, Face   Body parts bathed by helper: Front perineal area, Buttocks     Bathing assist Assist Level: Set up assist     Upper Body Dressing/Undressing Upper body dressing   What is the patient wearing?: Pull over shirt    Upper body assist Assist Level: Set up assist    Lower Body Dressing/Undressing Lower body dressing      What is the patient wearing?: Pants     Lower body assist Assist for lower body dressing: Supervision/Verbal cueing     Toileting Toileting    Toileting assist Assist for toileting: Supervision/Verbal cueing     Transfers Chair/bed transfer  Transfers assist     Chair/bed transfer assist level: Contact Guard/Touching assist     Locomotion Ambulation   Ambulation assist      Assist level: Contact Guard/Touching assist Assistive device: Rollator Max distance: 188ft   Walk 10 feet activity   Assist     Assist level: Contact Guard/Touching assist Assistive device: Rollator   Walk 50 feet activity   Assist    Assist level: Contact Guard/Touching assist Assistive device: Rollator    Walk 150 feet activity   Assist Walk 150 feet activity did not occur: Safety/medical concerns (fatigue, weakness, decreased balance)  Assist level: Contact Guard/Touching assist Assistive device:  Rollator    Walk 10 feet on uneven surface  activity   Assist Walk 10 feet on uneven surfaces activity did not occur: Safety/medical concerns (fatigue, weakness, decreased balance)         Wheelchair     Assist Is the patient using a wheelchair?: Yes Type of Wheelchair: Manual    Wheelchair assist level: Supervision/Verbal cueing Max wheelchair distance: >65'    Wheelchair 50 feet with 2 turns activity    Assist        Assist Level: Supervision/Verbal cueing   Wheelchair 150 feet activity     Assist      Assist Level: Supervision/Verbal cueing   Blood pressure 114/78, pulse 73,  temperature 98.7 F (37.1 C), temperature source Oral, resp. rate 14, height 5\' 7"  (1.702 m), weight 95.8 kg, SpO2 97 %.  Medical Problem List and Plan: 1.  Debility secondary to SBO with bowel perforation s/p NGT and TPN and persistent ileus and constipation on IV ABX for pelvic abscess  -Continue CIR therapies including PT, OT, t 2.  Impaired mobility: ambulating <300 feet: d/c Lovenox             -antiplatelet therapy: N/A 3. Headaches/Chronic neck pain/Pain Management: Takes gabapentin prn --continue oxycodone prn              -11/23 will schedule tylenol for headaches. Appear to be tension type. 4. Mood: LCSW to follow for evaluation and support.              -antipsychotic agents: N/a 5. Neuropsych: This patient is capable of making decisions on his own behalf. 6. Skin/Wound Care: Routine  pressure  relief measures 7. Fluids/Electrolytes/Nutrition: Monitor I/Os 8. SBO with ileus s/p lysis of adhesions: Continue wound care bid with bid wet to dry dressing changes - moistened plain guaze packing strips or 2 inch kerlix             -11/23 will change abd wound dressing to calcium alginate 9. Abdominal wall seroma: Penrose drain placed 11/17 and to be removed in 2 weeks in clinic. 10. Wound infection: On Cefotetan, Rocephin and Flagyl thorough 11/28, would looks good externally              -- Repeat CT abdomen pelvis 11/24 and to consult ID for follow-up on antibiotic regimen--Have ordered 11. HTN: Monitor BP TID  Controlled 11/22 12. T2DM: Hgb a1c- 6.0 and well controlled. Will monitor BS ac/hs and use SSI for elevated BS. Continue glargine insulin. --Used victoza 1.8, glucotrol and Jardiance. Controlled on 11/23 13. Gout: Stable on colchicine.  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  15. Hyperkalemia: 1 dose kayexalate 11/18, 3.9 on 11.21, monitor as needed.  16. Hyponatremia: Na 134, monitor as needed.  17. Anemia: d/c Lovenox  above given improved ambulation, monitor Hgb as needed.  18. Disposition: HFU scheduled.   LOS: 7 days A FACE TO FACE EVALUATION WAS PERFORMED  12/18 04/01/2021, 9:13 AM

## 2021-04-02 ENCOUNTER — Other Ambulatory Visit: Payer: Self-pay

## 2021-04-02 LAB — GLUCOSE, CAPILLARY
Glucose-Capillary: 120 mg/dL — ABNORMAL HIGH (ref 70–99)
Glucose-Capillary: 108 mg/dL — ABNORMAL HIGH (ref 70–99)
Glucose-Capillary: 146 mg/dL — ABNORMAL HIGH (ref 70–99)
Glucose-Capillary: 124 mg/dL — ABNORMAL HIGH (ref 70–99)

## 2021-04-02 NOTE — Progress Notes (Signed)
Occupational Therapy Session Note  Patient Details  Name: Daniel Perkins MRN: [id number] Date of Birth: 05-02-1956  Today's Date: 04/02/2021 OT Individual Time: 1435-1535 OT Individual Time Calculation (min): 60 min    Short Term Goals: Week 1:  OT Short Term Goal 1 (Week 1): STG = LTG due to ELOS  Skilled Therapeutic Interventions/Progress Updates:  Patient met seated in recliner in agreement with OT treatment session. Reporting headache but states RN gave medication prior to entry. Patient completed sit to stand from recliner and functional mobility to commode in bathroom with supervision A. Able to void bladder in standing with close supervision A for safety. Hand hygiene standing at sink level with supervision A. Functional mobility from hospital room to and from ortho gym on 80M without need for rest  break. 2/4 DOE noted. Patient performed therapeutic exercise seated at BUE arm ergometer for 10 min. RMP 38-40 and 1.5lb wrist weights on. Patient then completed 3 sets x10 reps each of BUE HEP with 5lb on L 2/2 shoulder pain at baseline and 10lb on R. Session concluded with patient seated in recliner with call bell within reach, chair alarm activated and all needs met.    Therapy Documentation Precautions:  Precautions Precautions: Other (comment) Precaution Comments: abdominal precautions Restrictions Weight Bearing Restrictions: No Other Position/Activity Restrictions: no lifting >10lbs General:    Therapy/Group: Individual Therapy  Yarimar Lavis R Howerton-Davis 04/02/2021, 2:37 PM

## 2021-04-02 NOTE — Progress Notes (Signed)
Physical Therapy Session Note  Patient Details  Name: Daniel Perkins MRN: [id number] Date of Birth: 02-Aug-1955  Today's Date: 04/02/2021 PT Individual Time: 1100-1155 PT Individual Time Calculation (min): 55 min   Short Term Goals: Week 1:  PT Short Term Goal 1 (Week 1): STG=LTG due to LOS Week 2:    Week 3:     Skilled Therapeutic Interventions/Progress Updates:  Pt initially denies pain.     Pt initially oob in recliner.  Equipment recently delivered to room.  Therapist adjusted rollator to proper height for pt.  Pt Sit to stand from recliner w/cga. Gait to 4W ortho gym w/rollator w/cga, in elevator pt rests by leaning back agaist wall then resumes gait w/rollator to gym, forward trunk lean w/downward gaze.    Ramp:  ascended/descended 103ft ramp x 2 w/rollator w/cga, cues for posture, cues for technique descending/decreasing step length to ensure balance.  Functional balance/core activation: Sit to stand + sidestep + stand to sit repeated length of mat in each direction x 1 w/no AD and min to cga for balance.  Improved balance w/focus on core activation. Repeated x 2 sets  Standing without AD alternating stepping forward/back w/self cues to "squeeze" for core activation between steps, cga, decreased stability w/steppin L vs R but no balance loss. Repeated x 2 sets.  Gait:  >133ft back to room including elevator use as above, cues for upright trunk/core activation during gait training, mild forward lean, downward gaze.  Transfers to recliner w/rollaor and cga.  Pt left oob in recliner w/chair alarm set and needs in reach.   Therapy Documentation Precautions:  Precautions Precautions: Other (comment) Precaution Comments: abdominal precautions Restrictions Weight Bearing Restrictions: No Other Position/Activity Restrictions: no lifting >10lbs    Therapy/Group: Individual Therapy Rada Hay, PT   Shearon Balo 04/02/2021, 12:07 PM

## 2021-04-02 NOTE — Progress Notes (Signed)
Physical Therapy Session Note  Patient Details  Name: Daniel Perkins MRN: [id number] Date of Birth: 09-19-1955  Today's Date: 04/02/2021 PT Individual Time: 5025-6154 PT Individual Time Calculation (min): 42 min   Short Term Goals: Week 1:  PT Short Term Goal 1 (Week 1): STG=LTG due to LOS   Skilled Therapeutic Interventions/Progress Updates:   Pt received sitting in in rollator in rehab gym following PT treatment, and agreeable to PT. Pt performed gait training through hall x 159f, 25109f and 13036fith supervision assist and BUE supported on Rollator; cues for improved posture and step length on the RLE as tolerated. Pt reports pulling at incision with improved posture. Dynamic standing balance/tolerance while engaged in gross motor task of Wii bowling with supervision assist and cues for decreased use of UE support throughout. Pt able to tolerate 8 frames in standing prior to requiring rest break for Bowl movement. Ambulatory transfer to bathroom with supervision assist. Continent bowel movement with clothing management and peri care performed by pt. Patient returned to room and performed stand pivot to recliner with supervision assist and rollator. Pt left sitting in recliner with call bell in reach and all needs met.         Therapy Documentation Precautions:  Precautions Precautions: Other (comment) Precaution Comments: abdominal precautions Restrictions Weight Bearing Restrictions: No Other Position/Activity Restrictions: no lifting >10lbs    Pain: Pain Assessment Pain Scale: 0-10 Pain Score: 0-No pain Faces Pain Scale: No hurt    Therapy/Group: Individual Therapy  AusLorie Phenix/25/2022, 9:47 AM

## 2021-04-02 NOTE — Progress Notes (Signed)
PROGRESS NOTE   Subjective/Complaints:  No abd pain, reviewed abd CT   ROS: Patient denies CP, SOB, N/V/D  Objective:   CT ABDOMEN PELVIS W CONTRAST  Result Date: 04/01/2021 CLINICAL DATA:  Lower abdominal pain EXAM: CT ABDOMEN AND PELVIS WITH CONTRAST TECHNIQUE: Multidetector CT imaging of the abdomen and pelvis was performed using the standard protocol following bolus administration of intravenous contrast. CONTRAST:  OMNIPAQUE IOHEXOL 300 MG/ML  SOLN COMPARISON:  CT abdomen and pelvis 03/19/2021 FINDINGS: Lower chest: No acute process identified. Breathing motion and chronic changes of the lungs. Hepatobiliary: Liver is normal in size and contour with no suspicious mass identified. Small amount of increased density material in the dependent gallbladder may represent stones/sludge. No biliary ductal dilatation. Pancreas: Unremarkable. No pancreatic ductal dilatation or surrounding inflammatory changes. Spleen: Normal in size without focal abnormality. Adrenals/Urinary Tract: Adrenal glands are unremarkable. Kidneys are normal, without renal calculi, focal lesion, or hydronephrosis. Bladder is unremarkable. Stomach/Bowel: Small hiatal hernia. No bowel obstruction, free air or pneumatosis. Colonic diverticulosis. Large amount of retained fecal material throughout the colon. Appendix is normal. Vascular/Lymphatic: Aortic atherosclerosis. No enlarged abdominal or pelvic lymph nodes. Reproductive: Prostate gland is mildly enlarged. Other: Interval resolution of the previous fluid collection anterior to the rectum and interval resolution and surgical changes of the previous fluid collection in the anterior subcutaneous tissues along the surgical incision. No ascites. Musculoskeletal: No suspicious bony lesions identified. IMPRESSION: 1. Interval resolution of previous fluid collections anterior to the rectum and in the anterior midline soft  tissues along the surgical incision. 2. Colonic diverticulosis. 3. Small hiatal hernia. 4. Other chronic findings as described. Electronically Signed   By: Jannifer Hick M.D.   On: 04/01/2021 13:08   No results for input(s): WBC, HGB, HCT, PLT in the last 72 hours.  No results for input(s): NA, K, CL, CO2, GLUCOSE, BUN, CREATININE, CALCIUM in the last 72 hours.   Intake/Output Summary (Last 24 hours) at 04/02/2021 0801 Last data filed at 04/02/2021 0747 Gross per 24 hour  Intake 420 ml  Output 1925 ml  Net -1505 ml         Physical Exam: Vital Signs Blood pressure 129/81, pulse 77, temperature 98.4 F (36.9 C), temperature source Oral, resp. rate 16, height 5\' 7"  (1.702 m), weight 95.6 kg, SpO2 98 %.  General: No acute distress Mood and affect are appropriate Heart: Regular rate and rhythm no rubs murmurs or extra sounds Lungs: Clear to auscultation, breathing unlabored, no rales or wheezes Abdomen: Positive bowel sounds, soft nontender to palpation, nondistended Extremities: No clubbing, cyanosis, or edema  Skin: Warm and dry.  Abdominal wound with central area packed, 1cm packed area, no drainage or surrounding erythema /induration  Musc: No edema in extremities.  No tenderness in extremities. No pain in his right temporal area with palpation Neuro: Alert . No cn findings.  Motor: Grossly 4+-5/5 throughout. No focal sensory  Assessment/Plan: 1. Functional deficits which require 3+ hours per day of interdisciplinary therapy in a comprehensive inpatient rehab setting. Physiatrist is providing close team supervision and 24 hour management of active medical problems listed below. Physiatrist and rehab team continue to assess barriers  to discharge/monitor patient progress toward functional and medical goals  Care Tool:  Bathing    Body parts bathed by patient: Right arm, Left arm, Chest, Abdomen, Front perineal area, Buttocks, Right upper leg, Left upper leg, Right lower  leg, Left lower leg, Face   Body parts bathed by helper: Front perineal area, Buttocks     Bathing assist Assist Level: Set up assist     Upper Body Dressing/Undressing Upper body dressing   What is the patient wearing?: Pull over shirt    Upper body assist Assist Level: Set up assist    Lower Body Dressing/Undressing Lower body dressing      What is the patient wearing?: Pants     Lower body assist Assist for lower body dressing: Supervision/Verbal cueing     Toileting Toileting    Toileting assist Assist for toileting: Supervision/Verbal cueing     Transfers Chair/bed transfer  Transfers assist     Chair/bed transfer assist level: Contact Guard/Touching assist     Locomotion Ambulation   Ambulation assist      Assist level: Contact Guard/Touching assist Assistive device: Rollator Max distance: 161ft   Walk 10 feet activity   Assist     Assist level: Contact Guard/Touching assist Assistive device: Rollator   Walk 50 feet activity   Assist    Assist level: Contact Guard/Touching assist Assistive device: Rollator    Walk 150 feet activity   Assist Walk 150 feet activity did not occur: Safety/medical concerns (fatigue, weakness, decreased balance)  Assist level: Contact Guard/Touching assist Assistive device: Rollator    Walk 10 feet on uneven surface  activity   Assist Walk 10 feet on uneven surfaces activity did not occur: Safety/medical concerns (fatigue, weakness, decreased balance)         Wheelchair     Assist Is the patient using a wheelchair?: Yes Type of Wheelchair: Manual    Wheelchair assist level: Supervision/Verbal cueing Max wheelchair distance: >150'    Wheelchair 50 feet with 2 turns activity    Assist        Assist Level: Supervision/Verbal cueing   Wheelchair 150 feet activity     Assist      Assist Level: Supervision/Verbal cueing   Blood pressure 129/81, pulse 77, temperature  98.4 F (36.9 C), temperature source Oral, resp. rate 16, height 5\' 7"  (1.702 m), weight 95.6 kg, SpO2 98 %.  Medical Problem List and Plan: 1.  Debility secondary to SBO with bowel perforation s/p NGT and TPN and persistent ileus and constipation on IV ABX for pelvic abscess  -Continue CIR therapies including PT, OT,  2.  Impaired mobility: ambulating well: d/c Lovenox             -antiplatelet therapy: N/A 3. Headaches/Chronic neck pain/Pain Management: Takes gabapentin prn --continue oxycodone prn              -11/23 will schedule tylenol for headaches. Appear to be tension type. 4. Mood: LCSW to follow for evaluation and support.              -antipsychotic agents: N/a 5. Neuropsych: This patient is capable of making decisions on his own behalf. 6. Skin/Wound Care: Routine  pressure  relief measures 7. Fluids/Electrolytes/Nutrition: Monitor I/Os 8. SBO with ileus s/p lysis of adhesions: Continue wound care bid with bid wet to dry dressing changes - moistened plain guaze packing strips or 2 inch kerlix             -11/23  will change abd wound dressing to calcium alginate 9. Abdominal wall seroma: Penrose drain placed 11/17 and to be removed in 2 weeks in clinic. 10. Wound infection: On Cefotetan, Rocephin and Flagyl thorough 11/28,             -- Repeat CT abdomen pelvis 11/24 and to consult ID for follow-up on antibiotic regimen--Looks like there is resolution of fluid collections, pt without abd pain 11. HTN: Monitor BP TID  Controlled 11/22 12. T2DM: Hgb a1c- 6.0 and well controlled. Will monitor BS ac/hs and use SSI for elevated BS. Continue glargine insulin. --Used victoza 1.8, glucotrol and Jardiance. Controlled on 11/23 13. Gout: Stable on colchicine.  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  15. Hyperkalemia: 1 dose kayexalate 11/18, 3.9 on 11.21, monitor as needed.  16. Hyponatremia: Na 134, monitor as needed.  17. Anemia: d/c  Lovenox above given improved ambulation, monitor Hgb as needed.  18. Disposition: HFU scheduled.   LOS: 8 days A FACE TO FACE EVALUATION WAS PERFORMED  Erick Colace 04/02/2021, 8:01 AM

## 2021-04-02 NOTE — Progress Notes (Signed)
Physical Therapy Session Note  Patient Details  Name: Daniel Perkins MRN: [id number] Date of Birth: November 30, 1955  Today's Date: 04/02/2021 PT Individual Time: 0800-0856 PT Individual Time Calculation (min): 56 min   Short Term Goals: Week 1:  PT Short Term Goal 1 (Week 1): STG=LTG due to LOS  Skilled Therapeutic Interventions/Progress Updates:   Received pt semi-reclined in bed with MD present for morning rounds. Pt agreeable to PT treatment and denied any pain during session. Session with emphasis on functional mobility, dressing, generalized strengthening, dynamic standing balance/coordination, gait training, NMR, and improved activity tolerance. Pt transferred semi-reclined<>sitting EOB with HOB elevated and use of bedrails with supervision. Doffed gown and donned pull over shirt and sweatshirt with set up assist. Donned pants in sitting with set up assist and stood to pull over hips with close supervision. Donned shoes sitting in figure four position with set up assist and pt transferred sit<>stand with rollator and supervision and ambulated 157ft x 2 trials with rollator and CGA to 4W therapy gym, including navigating on/off elevator. Worked on dynamic standing balance performing alternating toe taps with 3in step with min/mod handheld assist 2x20 with 1 posterior LOB onto mat. Pt performed TUG with rollator and CGA with average of 17 seconds: -Trial 1: 19 seconds -Trial 2: 17 seconds -Trial 3: 15 seconds Pt educated on test results and significance indicating high fall risk as well as importance of using rollator upon D/C - pt verbalized understanding. Worked on core strengthening batting ball with 3lb dowel for 3x20 reps. Ambulated in/out of hallway and performed lateral side stepping at rail with BUE support for 69ft and with 1 UE support for 28ft with CGA with emphasis on hip abductor strengthening - cues to keep toes forward. Pt then ambulated additional 24ft forwards and 67ft backwards  with L handrail and CGA with emphasis on balance - no LOB noted but increased difficulty stepping backwards due to decreased ankle DF ROM. Pt left in gym at end of session; handoff to next PT.  Therapy Documentation Precautions:  Precautions Precautions: Other (comment) Precaution Comments: abdominal precautions Restrictions Weight Bearing Restrictions: No Other Position/Activity Restrictions: no lifting >10lbs  Therapy/Group: Individual Therapy Martin Majestic PT, DPT   04/02/2021, 7:32 AM

## 2021-04-02 NOTE — Progress Notes (Signed)
Pt c/o mild discomfort with BM. PRN miralax given. Pt encouraged to intake more water.

## 2021-04-03 LAB — GLUCOSE, CAPILLARY
Glucose-Capillary: 109 mg/dL — ABNORMAL HIGH (ref 70–99)
Glucose-Capillary: 132 mg/dL — ABNORMAL HIGH (ref 70–99)
Glucose-Capillary: 125 mg/dL — ABNORMAL HIGH (ref 70–99)
Glucose-Capillary: 120 mg/dL — ABNORMAL HIGH (ref 70–99)

## 2021-04-03 MED ORDER — OXYCODONE HCL 5 MG PO TABS: 5.0000 mg | ORAL_TABLET | ORAL | Status: DC | PRN

## 2021-04-03 MED ORDER — METFORMIN HCL 500 MG PO TABS
250.0000 mg | ORAL_TABLET | Freq: Every day | ORAL | Status: DC
Start: 2021-04-04 — End: 2021-04-05
  Administered 2021-04-04 – 2021-04-05 (×2): 250 mg via ORAL
  Filled 2021-04-03 (×2): qty 1

## 2021-04-03 MED ORDER — ALTEPLASE 2 MG IJ SOLR
2.0000 mg | Freq: Once | INTRAMUSCULAR | Status: DC
  Filled 2021-04-03: qty 2

## 2021-04-03 MED ORDER — ALTEPLASE 2 MG IJ SOLR: 2.0000 mg | Freq: Once | INTRAMUSCULAR | Status: DC

## 2021-04-03 NOTE — Progress Notes (Signed)
Nurse performed wound care per order to abdominal incision. Old dressing Clean, intact. Scant serosanguinous drainage observed. No odor. Wound cleansed with NS, dried with gauze. Calcium alginate applied to wound bed. Nurse observed packing stripes in lower wound near umbilicus. No orders to remove. Will discuss with MD.   04/02/21 1414  Incision (Closed) 03/05/21 Abdomen Other (Comment)  Date First Assessed/Time First Assessed: 03/05/21 1126   Location: Abdomen  Location Orientation: Other (Comment)  Dressing Type Gauze (Comment) (calcium alginate)  Dressing Changed  Dressing Change Frequency Daily  Site / Wound Assessment Pink  Margins Unattached edges (unapproximated)  Closure None  Drainage Amount Scant  Drainage Description Serosanguineous  Treatment Cleansed (dressing changed per order)

## 2021-04-03 NOTE — Progress Notes (Signed)
Occupational Therapy Session Note  Patient Details  Name: Daniel Perkins MRN: [id number] Date of Birth: Oct 22, 1955  Today's Date: 04/03/2021 OT Individual Time: 0017-4944 and 9675-9163 OT Individual Time Calculation (min): 54 min and 28 min   Short Term Goals: Week 1:  OT Short Term Goal 1 (Week 1): STG = LTG due to ELOS   Skilled Therapeutic Interventions/Progress Updates:    Session 1: Pt greeted at time of session in bed sleeping but easily woken with verbal stimuli. No reports of pain during sessoin. Note schedule in patient room incorrect and provided correct one. Supine > sit Supervision and walked with new personal rollator to sink level with CGA. Seated on rollator, pt performing grooming tasks for shaving and UB/LB bathing. Provided CGA in standing for LB bathe/dress but otherwise supervision for safety only seated on rollator. Pt with good safety awareness locking brakes and with hand/foot placement. Pt able to achieve figure four seated for donning socks, washing feet, and applying lotion. Discussion throughout session regarding home set up for IADLs and accessing items needed for ADLs/IADLs using rollator. Pt also performing functional mobility room <> 5th floor north tower approx 150+ feet with rollator Supervision for global endurance training. Back in room alarm on call bell in reach.   Session 2: Pt greeted at time of session sitting up in recliner, no pain. Agreeable to OT session. Ambulated room <> gym on [facility] with Supervision and rollator. In gym, focused on dynamic standing balance while on airex pad and reaching out of BOS and crossing midline for connect 4 task. Attempted squats and lunges but pt with difficulty adhering to form, wanting to have a flexed posture despite cues and task terminated. Switched to dynamic standing with placing L/R foot in fwd/back and left/right movements aiming for target. Back in room, ambulated to/from bathroom Supervision and toileting in same  manner distant supervision. Bed level hand off to IV nurse for PICC line removal.   Therapy Documentation Precautions:  Precautions Precautions: Other (comment) Precaution Comments: abdominal precautions Restrictions Weight Bearing Restrictions: No Other Position/Activity Restrictions: no lifting >10lbs     Therapy/Group: Individual Therapy  Erasmo Score 04/03/2021, 7:11 AM

## 2021-04-03 NOTE — Progress Notes (Signed)
Physical Therapy Session Note  Patient Details  Name: Morad Tal MRN: [id number] Date of Birth: 04-Jul-1955  Today's Date: 04/03/2021 PT Individual Time: 5170-0174 PT Individual Time Calculation (min): 32 min   Short Term Goals: Week 1:  PT Short Term Goal 1 (Week 1): STG=LTG due to LOS  Skilled Therapeutic Interventions/Progress Updates:    Pt received supine in bed and agreeable to therapy session. Supine>sitting R EOB, HOB partially elevated and using bedrail, with supervision. Sit<>stands using rollator with close supervision for safety during session - pt demos excellent management of rollator breaks for safety throughout session. Gait training ~29ft down to main therapy gym using rollator with close supervision for safety including on/off elevator - demos slight excess anterior trunk lean/hip flexion with decreased B LE step lengths.   Gait training 16ft, no AD, with min assist at pelvis to facilitate weight shifting - pt demos improving upright posture but continues to have shortened step lengths with heavy foot landing and excessive trunk movement on pelvis.   Gait training ~40ft to/from stairs, no AD, with min assist as just described- continues with same impairments (decreased B LE step lengths, narrow BOS, increased R/L lateral trunk flexion over stance limb).   B LE functional strengthening using stairs via repeated step up/down on/off 1st 6" step using B HRs for support - CGA for safety - cuing for increased hip extensor activation when stepping up.   Gait training ~265ft back to room using rollator with close supervision and intermittent CGA for safety with fatigue - continues to demo above gait impairments that are more prominent when fatigued. Pt left seated in recliner with needs in reach and chair alarm on.   Therapy Documentation Precautions:  Precautions Precautions: Other (comment) Precaution Comments: abdominal precautions Restrictions Weight Bearing  Restrictions: No Other Position/Activity Restrictions: no lifting >10lbs   Pain:  Denies pain during session.   Therapy/Group: Individual Therapy  Ginny Forth , PT, DPT, NCS, CSRS  04/03/2021, 3:12 PM

## 2021-04-03 NOTE — Progress Notes (Signed)
Wound care to abdominal incision. Pt seated in recliner with MD upon arrival. Old dressing CDI. 1 piece of calcium alginate removed from superior wound bed. Wound bed with pink beefy appearance. Wound cleansed with NS, dried with gauze, dressed per order. Pt tolerated well. MD took picture for pt chart.

## 2021-04-03 NOTE — Progress Notes (Signed)
Physical Therapy Session Note  Patient Details  Name: Daniel Perkins MRN: [id number] Date of Birth: May 16, 1955  Today's Date: 04/03/2021 PT Individual Time: 2703-5009 PT Individual Time Calculation (min): 58 min   Short Term Goals: Week 1:  PT Short Term Goal 1 (Week 1): STG=LTG due to LOS  Skilled Therapeutic Interventions/Progress Updates:  Patient seated upright in recliner on entrance to room. Patient alert and agreeable to PT session.   Patient with no pain complaint throughout session. Does c/o tightness around abdominal incision. Education provided re: need for skin/ scar mobilization in order to prevent adhering and skin contracture/ restrictions that prevent pt from upright posture without pain.   Therapeutic Activity: Transfers: Patient performed sit<>stand and stand pivot transfers throughout session with supervision. Standing toileting performed with distant supervision. Hand hygiene at sink with close supervision. All using pt's new, personal rollator.  No vc provided for technique or safety.   Gait Training:  Patient ambulated >300 ft x2 using rollator with close supervision. Demonstrated decreased step length and forward flexed posture d/t pain at abdomen from surgical incision. Provided vc/ tc for upright posture, gentle TA contraction, decreased pressure into BUE, improved confidence in balance following NMR. Pt able to control rollator over thresholds into/ out of elevator, making several 90 and 180deg turns, backing up using rollator, as well as safely locking brakes and backing away and walking around rollator using hand grips to reach objects called to pt. All with close supervision and no vc required for brake application or safe use of rollator grips.   Neuromuscular Re-ed: NMR facilitated during session with focus on standing balance. Pt guided in ladder drill focusing first on lateral and fwd/ bkwd stepping. UE support provided by this therapist's forearms. Noted  increase in pressure from pt with bkwd stepping. Progressed ladder to forward stepping drill with called fwd/ bkwd, lateral and diagonal stepping patterns with attempt to provide vc for conscious balance maintenance prior to bkwd stepping for improved/ decreased UE support. Overall CGA/ HHA to complete.   Once reaching end of drill, pt encouraged to ambulate length of ladder back to mat table with no UE support but Bil hip support provided by therapist. Pt hesitant but willing. Small steps initially that improve with practice over distance. Requires CGA to complete. Pt surprised with performance and willing to attempt again. Pt provided with education to focus on upright posture, level gaze, maintaining balance through weight shift to each LE with focus on weight shift. Pt completes distance x4 with improving upright posture, smoothness of LE advancement, increasing step length, and BUE positioning closer to body with slight increase in appropriate swing by end of bout. Pt tearful when sitting and states, "I'm getting better." Pt provided with continued support and encouragement and reminded that his hard work is helping to progress his abilities and will get him home sooner rather than later. Pt appreciative for hard work of all therapists to help him improve.   NMR performed for improvements in motor control and coordination, balance, sequencing, judgement, and self confidence/ efficacy in performing all aspects of mobility at highest level of independence.   Manual Therapy: Educated and demonstrated to pt how to perform gentle scar massage for pt's abdomen. As pt was seated at mat table, demonstrated technique to pt on his thigh. Demonstrated directional up/ down and lateral movements using whole palm in order to hold skin together on all sides and move skin together in same directions. Then pt can progress to diagonal movements, then  circular  patterns as well depending on feeling. Should never hurt but  should feel stretch, especially underneath skin. Goal is for increased movement that allows pt to move freely with less pain with spinal/ abdominal movements as he heals.   Patient seated in recliner at end of session with brakes locked, seat alarm set, and all needs within reach. Provided with vanilla ensure with cup of ice on request. Rn notified.      Therapy Documentation Precautions:  Precautions Precautions: Other (comment) Precaution Comments: abdominal precautions Restrictions Weight Bearing Restrictions: No Other Position/Activity Restrictions: no lifting >10lbs General:   Vital Signs:   Pain:   No pain during session, but related tightness at abdomen with upright posture/ trunk positioning.   Therapy/Group: Individual Therapy  Loel Dubonnet PT, DPT 04/03/2021, 8:03 AM

## 2021-04-03 NOTE — Progress Notes (Signed)
PROGRESS NOTE   Subjective/Complaints: No new complaints Watching football game Had a good day with therapy Wound examined  ROS: Patient denies CP, SOB, N/V/D  Objective:   No results found. No results for input(s): WBC, HGB, HCT, PLT in the last 72 hours.  No results for input(s): NA, K, CL, CO2, GLUCOSE, BUN, CREATININE, CALCIUM in the last 72 hours.   Intake/Output Summary (Last 24 hours) at 04/03/2021 1626 Last data filed at 04/03/2021 1300 Gross per 24 hour  Intake 1140 ml  Output 500 ml  Net 640 ml        Physical Exam: Vital Signs Blood pressure 119/81, pulse 78, temperature 98.3 F (36.8 C), resp. rate 17, height 5\' 7"  (1.702 m), weight 97 kg, SpO2 98 %.  General: No acute distress Mood and affect are appropriate Heart: Regular rate and rhythm no rubs murmurs or extra sounds Lungs: Clear to auscultation, breathing unlabored, no rales or wheezes Abdomen:   Extremities: No clubbing, cyanosis, or edema  Skin: Warm and dry.  Abdominal wound with central area packed, 1cm packed area, no drainage or surrounding erythema /induration  Musc: No edema in extremities.  No tenderness in extremities. No pain in his right temporal area with palpation Neuro: Alert . No cn findings.  Motor: Grossly 4+-5/5 throughout. No focal sensory  Assessment/Plan: 1. Functional deficits which require 3+ hours per day of interdisciplinary therapy in a comprehensive inpatient rehab setting. Physiatrist is providing close team supervision and 24 hour management of active medical problems listed below. Physiatrist and rehab team continue to assess barriers to discharge/monitor patient progress toward functional and medical goals  Care Tool:  Bathing    Body parts bathed by patient: Right arm, Left arm, Chest, Abdomen, Front perineal area, Buttocks, Right upper leg, Left upper leg, Right lower leg, Left lower leg, Face   Body  parts bathed by helper: Front perineal area, Buttocks     Bathing assist Assist Level: Set up assist     Upper Body Dressing/Undressing Upper body dressing   What is the patient wearing?: Pull over shirt    Upper body assist Assist Level: Set up assist    Lower Body Dressing/Undressing Lower body dressing      What is the patient wearing?: Pants     Lower body assist Assist for lower body dressing: Set up assist     Toileting Toileting    Toileting assist Assist for toileting: Supervision/Verbal cueing     Transfers Chair/bed transfer  Transfers assist     Chair/bed transfer assist level: Supervision/Verbal cueing     Locomotion Ambulation   Ambulation assist      Assist level: Supervision/Verbal cueing Assistive device: Rollator Max distance: 300 ft   Walk 10 feet activity   Assist     Assist level: Supervision/Verbal cueing Assistive device: Rollator   Walk 50 feet activity   Assist    Assist level: Supervision/Verbal cueing Assistive device: Rollator    Walk 150 feet activity   Assist Walk 150 feet activity did not occur: Safety/medical concerns (fatigue, weakness, decreased balance)  Assist level: Supervision/Verbal cueing Assistive device: Rollator    Walk 10 feet on uneven surface  activity   Assist Walk 10 feet on uneven surfaces activity did not occur: Safety/medical concerns (fatigue, weakness, decreased balance)         Wheelchair     Assist Is the patient using a wheelchair?: Yes Type of Wheelchair: Manual    Wheelchair assist level: Supervision/Verbal cueing Max wheelchair distance: >150'    Wheelchair 50 feet with 2 turns activity    Assist        Assist Level: Supervision/Verbal cueing   Wheelchair 150 feet activity     Assist      Assist Level: Supervision/Verbal cueing   Blood pressure 119/81, pulse 78, temperature 98.3 F (36.8 C), resp. rate 17, height 5\' 7"  (1.702 m), weight  97 kg, SpO2 98 %.  Medical Problem List and Plan: 1.  Debility secondary to SBO with bowel perforation s/p NGT and TPN and persistent ileus and constipation on IV ABX for pelvic abscess  Continue CIR therapies including PT, OT,  2.  Impaired mobility: ambulating well: d/c Lovenox             -antiplatelet therapy: N/A 3. Headaches/Chronic neck pain/Pain Management: Takes gabapentin prn Decrease oxycodone to 5mg  q4H prn             -11/23 will schedule tylenol for headaches. Appear to be tension type. 4. Mood: LCSW to follow for evaluation and support.              -antipsychotic agents: N/a 5. Neuropsych: This patient is capable of making decisions on his own behalf. 6. Skin/Wound Care: Routine  pressure  relief measures 7. Fluids/Electrolytes/Nutrition: Monitor I/Os 8. SBO with ileus s/p lysis of adhesions: Continue wound care bid with bid wet to dry dressing changes - moistened plain guaze packing strips or 2 inch kerlix             -11/23 will change abd wound dressing to calcium alginate 9. Abdominal wall seroma: Penrose drain placed 11/17 and to be removed in 2 weeks in clinic. 10. Wound infection: On Cefotetan, Rocephin and Flagyl thorough 11/28,             -- Repeat CT abdomen pelvis 11/24 and to consult ID for follow-up on antibiotic regimen--Looks like there is resolution of fluid collections, pt without abd pain. May d/c PICC.  11. HTN: Monitor BP TID  Controlled 11/22 12. T2DM: Hgb a1c- 6.0 and well controlled. Will monitor BS ac/hs and use SSI for elevated BS. Continue glargine insulin. --Used victoza 1.8, glucotrol and Jardiance. Add metformin 250mg  daily. Cr normal 13. Gout: Stable on colchicine.  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  15. Hyperkalemia: 1 dose kayexalate 11/18, 3.9 on 11.21, monitor as needed.  16. Hyponatremia: Na 134, monitor as needed.  17. Anemia: d/c Lovenox above given improved ambulation, monitor Hgb  as needed.  18. Disposition: HFU scheduled.   LOS: 9 days A FACE TO FACE EVALUATION WAS PERFORMED  12/22 Martine Trageser 04/03/2021, 4:26 PM

## 2021-04-04 LAB — GLUCOSE, CAPILLARY
Glucose-Capillary: 170 mg/dL — ABNORMAL HIGH (ref 70–99)
Glucose-Capillary: 89 mg/dL (ref 70–99)
Glucose-Capillary: 116 mg/dL — ABNORMAL HIGH (ref 70–99)
Glucose-Capillary: 120 mg/dL — ABNORMAL HIGH (ref 70–99)

## 2021-04-05 LAB — BASIC METABOLIC PANEL
Anion gap: 7 (ref 5–15)
BUN: 14 mg/dL (ref 8–23)
CO2: 25 mmol/L (ref 22–32)
Calcium: 9.2 mg/dL (ref 8.9–10.3)
Chloride: 102 mmol/L (ref 98–111)
Creatinine, Ser: 1.01 mg/dL (ref 0.61–1.24)
GFR, Estimated: >60 mL/min (ref >60)
Glucose, Bld: 120 mg/dL — ABNORMAL HIGH (ref 70–99)
Potassium: 4.3 mmol/L (ref 3.5–5.1)
Sodium: 134 mmol/L — ABNORMAL LOW (ref 135–145)

## 2021-04-05 LAB — GLUCOSE, CAPILLARY
Glucose-Capillary: 119 mg/dL — ABNORMAL HIGH (ref 70–99)
Glucose-Capillary: 96 mg/dL (ref 70–99)
Glucose-Capillary: 140 mg/dL — ABNORMAL HIGH (ref 70–99)
Glucose-Capillary: 140 mg/dL — ABNORMAL HIGH (ref 70–99)

## 2021-04-05 NOTE — Progress Notes (Signed)
Physical Therapy Session Note  Patient Details  Name: Daniel Perkins MRN: [id number] Date of Birth: 1956/02/01  Today's Date: 04/05/2021 PT Individual Time: 2500-3704 PT Individual Time Calculation (min): 55 min   Short Term Goals: Week 1:  PT Short Term Goal 1 (Week 1): STG=LTG due to LOS  Skilled Therapeutic Interventions/Progress Updates:   Received pt semi-reclined in bed, pt agreeable to PT treatment, and denied any pain during session just "stretching" at incision site. Pt with concerns about D/C home tomorrow. Per pt, he now plans to D/C home alone and is worried about his strength and balance, ultimately requesting a few more days in rehab - notified MD and treatment team. Session with emphasis on discharge planning, functional mobility/transfers, generalized strengthening, dynamic standing balance/coordination, gait training, stair navigation, simulated car transfers, NMR, and improved activity tolerance. Pt transferred supine<>sitting EOB with HOB slightly elevated and supervision/mod I. Sit<>stand with rollator and supervision and pt ambulated 144ft x 1 and 15ft x 1 using rollator with supervision to 53M ortho gym - caution when getting in/out of elevator. Pt performed ambulatory simulated car transfer with rollator and supervision with cues to back up completely with rollator for safety. Pt then ambulated 23ft on uneven surfaces (ramp) with rollator and supervision - demonstrating good safety awareness and eccentric control when descending. Pt ambualted additional 15ft with rollator and supervision to main therapy gym and navigated 12 steps with 2 rails and supervision - cues for step to pattern vs step through pattern to optimize safety. Pt stepped on/off Biodex and performed the following exercises with emphasis on balance: -limits of stability training for 2 minutes and 26 seconds on level static with BUE support and CGA -limits of stability training for 45 seconds x 2 trials on level  2 with BUE support and CGA for balance -weight shift training for 1 minute and 30 seconds on level 5 with BUE support and CGA for balance Pt ambulated >167ft x 1 and 142ft x 1 with rollator and supervision back to room. Concluded session with pt sitting in recliner, needs within reach, and chair pad alarm on.   Therapy Documentation Precautions:  Precautions Precautions: Other (comment) Precaution Comments: abdominal precautions Restrictions Weight Bearing Restrictions: No Other Position/Activity Restrictions: no lifting >10lbs  Therapy/Group: Individual Therapy Martin Majestic PT, DPT   04/05/2021, 8:08 AM

## 2021-04-05 NOTE — Progress Notes (Signed)
Patient ID: Daniel Perkins, male   DOB: Oct 05, 1955, 65 y.o.   MRN: [id number]   Patient declined by [facility] due to no pt insurance pair.  [place], [place] [phone]

## 2021-04-05 NOTE — Progress Notes (Signed)
Physical Therapy Discharge Summary  Patient Details  Name: Daniel Perkins MRN: [id number] Date of Birth: 20-Jun-1955  Patient has met 7 of 7 long term goals due to improved activity tolerance, improved balance, improved postural control, increased strength, decreased pain, and improved coordination.  Patient to discharge at an ambulatory level Modified Independent. Pt plans to initially discharge home with daughter to provide intermittent assist prior to discharging home alone.   All goals met  Recommendation:  Patient will benefit from ongoing skilled PT services in home health setting to continue to advance safe functional mobility, address ongoing impairments in generalized strengthening, dynamic standing balance/coordination, NMR, gait training, endurance, and to minimize fall risk.  Equipment: rollator  Reasons for discharge: treatment goals met  Patient/family agrees with progress made and goals achieved: Yes  PT Discharge Precautions/Restrictions Precautions Precautions: Other (comment) Precaution Comments: abdominal precautions Restrictions Weight Bearing Restrictions: No Other Position/Activity Restrictions: no lifting >10lbs Pain Interference Pain Interference Pain Effect on Sleep: 0. Does not apply - I have not had any pain or hurting in the past 5 days Pain Interference with Therapy Activities: 0. Does not apply - I have not received rehabilitationtherapy in the past 5 days Pain Interference with Day-to-Day Activities: 1. Rarely or not at all Cognition Overall Cognitive Status: Within Functional Limits for tasks assessed Arousal/Alertness: Awake/alert Orientation Level: Oriented X4 Memory: Appears intact Awareness: Appears intact Problem Solving: Appears intact Safety/Judgment: Appears intact Sensation Sensation Light Touch: Appears Intact Proprioception: Appears Intact Coordination Gross Motor Movements are Fluid and Coordinated: Yes Fine Motor Movements  are Fluid and Coordinated: Yes Coordination and Movement Description: generalized weakness and deconditioning; but improved since eval Finger Nose Finger Test: [facility] bilaterally Heel Shin Test: [facility] bilaterally Motor  Motor Motor: Within Functional Limits Motor - Skilled Clinical Observations: generalized weakness/deconditioning; improved since eval  Mobility Bed Mobility Bed Mobility: Rolling Right;Rolling Left;Supine to Sit;Sit to Supine Rolling Right: Independent with assistive device Rolling Left: Independent with assistive device Supine to Sit: Independent with assistive device Sit to Supine: Independent with assistive device Transfers Transfers: Sit to Stand;Stand to Sit;Stand Pivot Transfers Sit to Stand: Independent with assistive device Stand to Sit: Independent with assistive device Stand Pivot Transfers: Independent with assistive device Transfer (Assistive device): Rollator Locomotion  Gait Ambulation: Yes Gait Assistance: Independent with assistive device Gait Distance (Feet): 300 Feet Assistive device: Rollator Gait Gait: Yes Gait Pattern: Impaired Gait Pattern: Step-to pattern;Step-through pattern;Decreased stride length;Trendelenburg;Decreased trunk rotation;Decreased step length - right;Decreased step length - left;Trunk flexed;Poor foot clearance - left;Poor foot clearance - right;Narrow base of support Gait velocity: decreased Stairs / Additional Locomotion Stairs: Yes Stairs Assistance: Supervision/Verbal cueing Stair Management Technique: Two rails Number of Stairs: 12 Height of Stairs: 6 Ramp: Independent with assistive device (rollator) Wheelchair Mobility Wheelchair Mobility: No (N/A)  Trunk/Postural Assessment  Cervical Assessment Cervical Assessment: Exceptions to [facility] (forward head) Thoracic Assessment Thoracic Assessment: Exceptions to [facility] (mild kyphosis) Lumbar Assessment Lumbar Assessment: Exceptions to [facility] (posterior pelvic tilt) Postural  Control Postural Control: Within Functional Limits  Balance Balance Balance Assessed: Yes Static Sitting Balance Static Sitting - Balance Support: Feet supported;Bilateral upper extremity supported Static Sitting - Level of Assistance: 7: Independent Dynamic Sitting Balance Dynamic Sitting - Balance Support: Feet supported;No upper extremity supported Dynamic Sitting - Level of Assistance: 6: Modified independent (Device/Increase time) Static Standing Balance Static Standing - Balance Support: Bilateral upper extremity supported (rollator) Static Standing - Level of Assistance: 6: Modified independent (Device/Increase time) Dynamic Standing Balance Dynamic Standing - Balance Support:  Bilateral upper extremity supported (rollator) Dynamic Standing - Level of Assistance: 6: Modified independent (Device/Increase time) Extremity Assessment  RLE Assessment RLE Assessment: Exceptions to [facility] Active Range of Motion (AROM) Comments: decreased ankle DF General Strength Comments: grossly generalized to 4/5 LLE Assessment LLE Assessment: Exceptions to [facility] Active Range of Motion (AROM) Comments: decreased ankle ROM General Strength Comments: gorssly generalized to 4/5  Analyssa Downs M Fabienne Nolasco Santiana Glidden PT, DPT  04/05/2021, 7:15 AM

## 2021-04-05 NOTE — Progress Notes (Signed)
PROGRESS NOTE   Subjective/Complaints:  Pt reports BM's are liquid- last night and intermittently yesterday- Was able to get to toilet.   Stopped taking Ensure, but notes stopped metformin in past due to diarrhea. Would like to stop it.    ROS:  Pt denies SOB, abd pain, CP, N/V/C/(+) D, and vision changes  Objective:   No results found. No results for input(s): WBC, HGB, HCT, PLT in the last 72 hours.  No results for input(s): NA, K, CL, CO2, GLUCOSE, BUN, CREATININE, CALCIUM in the last 72 hours.   Intake/Output Summary (Last 24 hours) at 04/05/2021 0807 Last data filed at 04/05/2021 0800 Gross per 24 hour  Intake 460 ml  Output 1075 ml  Net -615 ml        Physical Exam: Vital Signs Blood pressure 119/77, pulse 78, temperature 98.2 F (36.8 C), resp. rate 18, height 5\' 7"  (1.702 m), weight 95.8 kg, SpO2 90 %.   General: awake, alert, appropriate, sitting up at EOB; finished breakfast;  NAD HENT: conjugate gaze; oropharynx moist CV: regular rate; no JVD Pulmonary: CTA B/L; no W/R/R- good air movement GI: soft, NT, ND, (+)BS- abd wound as below; hyperactive BS Psychiatric: appropriate Neurological: Ox3  Abdomen:   Extremities: No clubbing, cyanosis, or edema  Skin: Warm and dry.  Abdominal wound with central area packed, 1cm packed area, no drainage or surrounding erythema /induration  Musc: No edema in extremities.  No tenderness in extremities. No pain in his right temporal area with palpation Neuro: Alert . No cn findings.  Motor: Grossly 4+-5/5 throughout. No focal sensory  Assessment/Plan: 1. Functional deficits which require 3+ hours per day of interdisciplinary therapy in a comprehensive inpatient rehab setting. Physiatrist is providing close team supervision and 24 hour management of active medical problems listed below. Physiatrist and rehab team continue to assess barriers to discharge/monitor  patient progress toward functional and medical goals  Care Tool:  Bathing    Body parts bathed by patient: Right arm, Left arm, Chest, Abdomen, Front perineal area, Buttocks, Right upper leg, Left upper leg, Right lower leg, Left lower leg, Face   Body parts bathed by helper: Front perineal area, Buttocks     Bathing assist Assist Level: Set up assist     Upper Body Dressing/Undressing Upper body dressing   What is the patient wearing?: Pull over shirt    Upper body assist Assist Level: Set up assist    Lower Body Dressing/Undressing Lower body dressing      What is the patient wearing?: Pants     Lower body assist Assist for lower body dressing: Set up assist     Toileting Toileting    Toileting assist Assist for toileting: Supervision/Verbal cueing     Transfers Chair/bed transfer  Transfers assist     Chair/bed transfer assist level: Supervision/Verbal cueing     Locomotion Ambulation   Ambulation assist      Assist level: Supervision/Verbal cueing Assistive device: Rollator Max distance: 300 ft   Walk 10 feet activity   Assist     Assist level: Supervision/Verbal cueing Assistive device: Rollator   Walk 50 feet activity   Assist  Assist level: Supervision/Verbal cueing Assistive device: Rollator    Walk 150 feet activity   Assist Walk 150 feet activity did not occur: Safety/medical concerns (fatigue, weakness, decreased balance)  Assist level: Supervision/Verbal cueing Assistive device: Rollator    Walk 10 feet on uneven surface  activity   Assist Walk 10 feet on uneven surfaces activity did not occur: Safety/medical concerns (fatigue, weakness, decreased balance)         Wheelchair     Assist Is the patient using a wheelchair?: Yes Type of Wheelchair: Manual    Wheelchair assist level: Supervision/Verbal cueing Max wheelchair distance: >60'    Wheelchair 50 feet with 2 turns activity    Assist         Assist Level: Supervision/Verbal cueing   Wheelchair 150 feet activity     Assist      Assist Level: Supervision/Verbal cueing   Blood pressure 119/77, pulse 78, temperature 98.2 F (36.8 C), resp. rate 18, height 5\' 7"  (1.702 m), weight 95.8 kg, SpO2 90 %.  Medical Problem List and Plan: 1.  Debility secondary to SBO with bowel perforation s/p NGT and TPN and persistent ileus and constipation on IV ABX for pelvic abscess  Con't CIR- PT and OT 2.  Impaired mobility: ambulating well: d/c Lovenox             -antiplatelet therapy: N/A 3. Headaches/Chronic neck pain/Pain Management: Takes gabapentin prn Decrease oxycodone to 5mg  q4H prn             -11/23 will schedule tylenol for headaches. Appear to be tension type.  11/28- denies pain- con't regimen 4. Mood: LCSW to follow for evaluation and support.              -antipsychotic agents: N/a 5. Neuropsych: This patient is capable of making decisions on his own behalf. 6. Skin/Wound Care: Routine  pressure  relief measures 7. Fluids/Electrolytes/Nutrition: Monitor I/Os 8. SBO with ileus s/p lysis of adhesions: Continue wound care bid with bid wet to dry dressing changes - moistened plain guaze packing strips or 2 inch kerlix             -11/23 will change abd wound dressing to calcium alginate 9. Abdominal wall seroma: Penrose drain placed 11/17 and to be removed in 2 weeks in clinic. 10. Wound infection: On Cefotetan, Rocephin and Flagyl thorough 11/28,             -- Repeat CT abdomen pelvis 11/24 and to consult ID for follow-up on antibiotic regimen--Looks like there is resolution of fluid collections, pt without abd pain. May d/c PICC.  11. HTN: Monitor BP TID  Controlled 11/22 12. T2DM: Hgb a1c- 6.0 and well controlled. Will monitor BS ac/hs and use SSI for elevated BS. Continue glargine insulin. --Used victoza 1.8, glucotrol and Jardiance. Add metformin 250mg  daily. Cr normal 11/28- Pt having a lot of diarrhea- will  stop Metformin- since caused diarrhea in past- per Dr 12/22.  13. Gout: Stable on colchicine.  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  15. Hyperkalemia: 1 dose kayexalate 11/18, 3.9 on 11.21, monitor as needed.   1128- labs pending.  16. Hyponatremia: Na 134, monitor as needed.  17. Anemia: d/c Lovenox above given improved ambulation, monitor Hgb as needed.  18. Disposition: HFU scheduled.   LOS: 11 days A FACE TO FACE EVALUATION WAS PERFORMED  Shaunette Gassner 04/05/2021, 8:07 AM

## 2021-04-05 NOTE — Progress Notes (Signed)
Occupational Therapy Session Note  Patient Details  Name: Daniel Perkins MRN: [id number] Date of Birth: 25-Jul-1955  Today's Date: 04/05/2021 OT Individual Time: 1005-1100 OT Individual Time Calculation (min): 55 min    Short Term Goals: Week 1:  OT Short Term Goal 1 (Week 1): STG = LTG due to ELOS  Skilled Therapeutic Interventions/Progress Updates:  Patient met seated in recliner in agreement with OT treatment session. 0/10 pain reported at rest and with activity. Patient reporting concern with tentative d/c home 11/29 given change in plan. Patient states that daughter, son-in-law and grandson work outside of the home and he would need to be able to complete ADLs, transfers and mobility with Mod I upon return home. Patient feels that a few more days in CIR would help him reach Mod I level. Treatment team aware of patient's concerns. Patient in agreement with bathing at shower level completing walk-in shower transfer with supervision A. Able to bathe/dressing UB/LB with distant supervision and good safety awareness. Patient then ambulated from hospital room to laundry room on 4W to wash soiled clothing. After seated rest break, patient ambulated back to room on 5C. Wrote note for patient to remember to move clothes from washer to dryer at time of next treatment session. Session concluded with patient seated in recliner with call bell within reach, chair alarm activated and all needs met.    Therapy Documentation Precautions:  Precautions Precautions: Other (comment) Precaution Comments: abdominal precautions Restrictions Weight Bearing Restrictions: No Other Position/Activity Restrictions: no lifting >10lbs General:    Therapy/Group: Individual Therapy  Andrus Sharp R Howerton-Davis 04/05/2021, 6:57 AM

## 2021-04-05 NOTE — Progress Notes (Signed)
Patient ID: Daniel Perkins, male   DOB: [birth year], 65 y.o.   MRN: [id number]  Pt referral sent to [facility]

## 2021-04-05 NOTE — Progress Notes (Signed)
Physical Therapy Session Note  Patient Details  Name: Daniel Perkins MRN: [id number] Date of Birth: 1956/04/02  Today's Date: 04/05/2021 PT Individual Time: 1100-1200 PT Individual Time Calculation (min): 60 min   Short Term Goals: Week 2:     Skilled Therapeutic Interventions/Progress Updates: Pt presents sitting in recliner and agreeable to therapy.  Pt transfers sit to stand w/ supervision to rollator.  Rollator handles adjusted to pt height. Pt amb x 100' w/ rollator and close supervision/CGA to elevator.  Pt positioned rollator and transitioned to sitting w/o cueing required.  Pt amb 250' including transition on/off elevator, but w/ noted sway and discussed need for pt to observe for safety when D/Cd to home.  Pt performed amb w/o AD and min A at hips forward and back x 15' x 3.  Cueing given for speed to maintain balance especially w/ LLE WB.  Pt performed sidestepping w/ min A.  Pt performed 2 trials of 10 toe-taps to 3 3/4" platform w/ cueing for safety to maintain balance.  Pt amb 250' w/ rollator and close supervision and stated need to sit as pt notices increased sway.  Pt returned to recliner w/ seat alarm on and all needs in reach.  Laundry returned to pt in PM.     Therapy Documentation Precautions:  Precautions Precautions: Other (comment) Precaution Comments: abdominal precautions Restrictions Weight Bearing Restrictions: No Other Position/Activity Restrictions: no lifting >10lbs General:   Vital Signs:   Pain:0/10 Pain Assessment Pain Scale: 0-10 Pain Score: 0-No pain Mobility:       Therapy/Group: Individual Therapy  [name] 04/05/2021, 12:28 PM

## 2021-04-05 NOTE — Progress Notes (Signed)
Inpatient Rehabilitation Care Coordinator Discharge Note   Patient Details  Name: Daniel Perkins MRN: [id number] Date of Birth: 08-26-1955   Discharge location: Home  Length of Stay: 12 Days  Discharge activity level: sup/cga  Home/community participation: PRN from sister and daughter  Patient response PR:[id number] Literacy - How often do you need to have someone help you when you read instructions, pamphlets, or other written material from your doctor or pharmacy?: Never  Patient response [id number] Isolation - How often do you feel lonely or isolated from those around you?: Never  Services provided included: SW, Pharmacy, TR, CM, RN, SLP, PT, OT, RD, MD  Financial Services:  Financial Services Utilized: Private Insurance [facility] Medicare  Choices offered to/list presented to: pt  Follow-up services arranged:  Home Health Home Health Agency: [facility]         Patient response to transportation need: Is the patient able to respond to transportation needs?: Yes In the past 12 months, has lack of transportation kept you from medical appointments or from getting medications?: No In the past 12 months, has lack of transportation kept you from meetings, work, or from getting things needed for daily living?: No    Comments (or additional information):  Patient/Family verbalized understanding of follow-up arrangements:  Yes  Individual responsible for coordination of the follow-up plan: pt  Confirmed correct DME delivered: Andria Rhein 04/05/2021    Andria Rhein

## 2021-04-05 NOTE — Progress Notes (Signed)
Inpatient Rehabilitation Discharge Medication Review by a Pharmacist  A complete drug regimen review was completed for this patient to identify any potential clinically significant medication issues.  High Risk Drug Classes Is patient taking? Indication by Medication  Antipsychotic No   Anticoagulant No   Antibiotic No   Opioid Yes OxyIR- post op pain  Antiplatelet No   Hypoglycemics/insulin Yes Novolog ss- T2DM  Vasoactive Medication Yes Coreg- HTN  Chemotherapy No   Other Yes Gabapentin- neuropathic pain Colchicine- gout prevention     Type of Medication Issue Identified Description of Issue Recommendation(s)  Drug Interaction(s) (clinically significant)     Duplicate Therapy     Allergy     No Medication Administration End Date     Incorrect Dose     Additional Drug Therapy Needed     Significant med changes from prior encounter (inform family/care partners about these prior to discharge).    Other  PTA medications- Norvasc, Diovan, Glipizide, Jardiance, Victoza, Flomax, aspirin Restart on discharge if clinically necessary    Clinically significant medication issues were identified that warrant physician communication and completion of prescribed/recommended actions by midnight of the next day:  No   Time spent performing this drug regimen review (minutes):  30   Damarious Holtsclaw BS, PharmD, BCPS Clinical Pharmacist  04/05/2021 1:33 PM

## 2021-04-05 NOTE — Progress Notes (Signed)
Patient ID: Daniel Perkins, male   DOB: 01-11-1956, 65 y.o.   MRN: [id number]  [facility] referral resent to [facility]

## 2021-04-06 LAB — GLUCOSE, CAPILLARY
Glucose-Capillary: 117 mg/dL — ABNORMAL HIGH (ref 70–99)
Glucose-Capillary: 134 mg/dL — ABNORMAL HIGH (ref 70–99)
Glucose-Capillary: 103 mg/dL — ABNORMAL HIGH (ref 70–99)
Glucose-Capillary: 180 mg/dL — ABNORMAL HIGH (ref 70–99)

## 2021-04-06 MED ORDER — CARVEDILOL 3.125 MG PO TABS
3.1250 mg | ORAL_TABLET | Freq: Two times a day (BID) | ORAL | Status: DC
  Administered 2021-04-06 – 2021-04-09 (×6): 3.125 mg via ORAL
  Filled 2021-04-06 (×5): qty 1

## 2021-04-06 NOTE — Progress Notes (Signed)
Physical Therapy Session Note  Patient Details  Name: Daniel Perkins MRN: [id number] Date of Birth: August 07, 1955  Today's Date: 04/06/2021 PT Individual Time: 1000-1056 and 1430-1525 PT Individual Time Calculation (min): 56 min and 55 min  Short Term Goals: Week 1:  PT Short Term Goal 1 (Week 1): STG=LTG due to LOS Week 2:  PT Short Term Goal 1 (Week 2): STG=LTG due to extended LOS  Skilled Therapeutic Interventions/Progress Updates:  Treatment Session 1 Received pt sitting in recliner, pt agreeable to PT treatment, and denied any pain during session. Session with emphasis on functional mobility, generalized strengthening, dynamic standing balance/coordination, gait training, and improved endurance. Pt performed all transfers with rollator and supervision throughout session, demonstrating excellent understanding of rollator safety and brakes management. Pt ambulated 144ft x 1 and >364ft x 1 with rollator and supervision to 4W dayroom with 1 standing rest break on elevator. Pt demonstrates improvements in upright posture as well as stride length. Took seated rest break then stepped on/off LiteGait treadmill with BUE support and CGA and donned LiteGait harness standing with max A. Worked on gait training for the following time frames: -344ft for 5 minutes at 0.7-0. with BUE support fading to no UE support - cues to increse stride length, relax shoulders, and for upright posture.  -375ft for 5 minutes at 0.7 increasing to 0.27mph with bilateral fingertip support for balance - improvements in step length and posture. Pt required seated rest brakes throughout session but remains extremely motivated to work during sessions. Pt ambulated >341ft x 1 and 134ft x 1 with rollator and supervision back to room with 1 standing rest break on elevator. Concluded session with pt sitting in recliner, needs within reach, and chair pad alarm on. Provided pt with fresh drinks.   Treatment Session 2 Received pt  sitting in recliner with NT present checking vitals. Pt agreeable to go outside for therapy today and did not c/o pain during session. Session with emphasis on functional and community mobility, generalized strengthening, dynamic standing balance/coordination, gait training, stair navigation, and improved endurance. Sit<>stands with rollator and supervision throughout session. Pt ambulated 127ft x 1 and 25ft x 1 outside to reflection fountain at entrance of [facility] using rollator and supervision. Pt ambulated additional 357ft x 1, 7ft x 1, and >248ft x 1 using rollator and supervision throughout session - emphasis on navigating uneven surfaces (concrete) and up/down mild inclines/declines with no LOB noted but occasional cues to relax shoulders. Practiced navigating tight turns/spaces weaving in/out of posts at entrance of [facility] with rollator and supervision. Pt then navigated 16 steps with BUE support on 1 handrail with CGA for balance using a lateral stepping technique. Pt performed the following exercises standing with BUE support and CGA for balance with emphasis on LE strength: -heel raises 2x15 bilaterally  -squats 2x10 -marches 2x20 bilaterally  Pt required rest breaks throughout session, but overall demonstrates significant improvements in endurance. Concluded session with pt sitting in recliner, needs within reach, and chair pad alarm on.   Therapy Documentation Precautions:  Precautions Precautions: Other (comment) Precaution Comments: abdominal precautions Restrictions Weight Bearing Restrictions: No Other Position/Activity Restrictions: no lifting >10lbs  Therapy/Group: Individual Therapy Martin Majestic PT, DPT   04/06/2021, 7:21 AM

## 2021-04-06 NOTE — Progress Notes (Signed)
Occupational Therapy Session Note  Patient Details  Name: Daniel Perkins MRN: [id number] Date of Birth: 1956-01-07  Today's Date: 04/06/2021 OT Individual Time: 0762-2633 OT Individual Time Calculation (min): 53 min    Short Term Goals: Week 2:  OT Short Term Goal 1 (Week 2): STG = LTG due to ELOS  Skilled Therapeutic Interventions/Progress Updates:  Skilled OT intervention completed with focus on ADL retraining, DME assembly education and functional ambulation for activity tolerance. Pt received semi-supine in bed, agreeable to session. Pt opted to complete sponge bathing at sink vs full shower for self-care this morning. Completed bed mobility with supervision for semi-supine to seated EOB. Pt donned both shoes with set up A. Sit > stand with rollator with supervision then ambulatory transfer with supervision to sink, where pt completed seated bathing at sink with set up A. Able to donn LB clothing with set up A, with safety cues needed to wipe floor down to remove water spillage due to sponge bathing method. Pt able to donn pants over hips in stance, then remain standing at sink using counter for intermittent UE support for balance during grooming tasks including shaving. Pt demonstrated increased endurance this session, being able to stand for about 10 mins in standing with no breaks, and off and on UE support. Donned shirt seated with set up A. During seated rest break, therapist educated on DME assembly and placement in the home for both the TTB and BSC, for safety and efficiency. Pt requested to work on ambulation at end of session as he feels his balance/walking is the most impaired out of all other skills. Pt able to ambulate to increase activity tolerance with CGA-supervision about 300 ft consecutively using rollator, with one standing rest break required at about 250 ft. Pt left seated in recliner, with chair alarm on and all needs in reach at end of session.  Therapy  Documentation Precautions:  Precautions Precautions: Other (comment) Precaution Comments: abdominal precautions Restrictions Weight Bearing Restrictions: No Other Position/Activity Restrictions: no lifting >10lbs  Pain: No c/o pain   Therapy/Group: Individual Therapy  Koltin Wehmeyer E Cyndra Feinberg 04/06/2021, 7:38 AM

## 2021-04-06 NOTE — Progress Notes (Signed)
Occupational Therapy Session Note  Patient Details  Name: Daniel Perkins MRN: [id number] Date of Birth: 11/01/1955  Today's Date: 04/06/2021 OT Individual Time: 1303-1332 OT Individual Time Calculation (min): 29 min    Short Term Goals: Week 1:  OT Short Term Goal 1 (Week 1): STG = LTG due to ELOS  Skilled Therapeutic Interventions/Progress Updates:    Pt received seated in recliner, no c/o pain, agreeable to therapy. Session focus on community, reintegration, ADL retraining, activity tolerance, dynamic standing balance, energy conservation education in prep for improved ADL/IADL/func mobility performance + decreased caregiver burden. Sit to stand with close S and ambulated throughout session with rollator and CGA due to increased distances. Ambulated down to and from hospital atrium via elevators, appropriate use of brakes throughout.  Able to locate 5 "shopping list" items in gift shop in reasonable amount of time with only CGA for balance throughout. Additionally, practiced weaving in/out busy hospital environment and outdoors on uneven surfaces. Required 2 seated rest breaks, but reported fatigue at 2/10 on modified RPE. Educated on importance of self-monitoring fatigue upon DC to reduce falls risk and to conserve energy.   Ambulatory transfer back to recliner same manner as above. Pt left seated in recliner with call bell in reach, and all immediate needs met.    Therapy Documentation Precautions:  Precautions Precautions: Other (comment) Precaution Comments: abdominal precautions Restrictions Weight Bearing Restrictions: No Other Position/Activity Restrictions: no lifting >10lbs  Pain: no c/o   ADL: See Care Tool for more details.   Therapy/Group: Individual Therapy  Rachel A Stevenson MS, OTR/L  04/06/2021, 6:53 AM 

## 2021-04-06 NOTE — Progress Notes (Signed)
Patient ID: Daniel Perkins, male   DOB: 09-Jan-1956, 65 y.o.   MRN: [id number]  Pt will not d/c today

## 2021-04-06 NOTE — Progress Notes (Signed)
Occupational Therapy Weekly Progress Note  Patient Details  Name: Daniel Perkins MRN: [id number] Date of Birth: Jul 23, 1955  Beginning of progress report period: March 26, 2021 End of progress report period: April 06, 2021  Pt is making steady progress towards LTGs. Since evaluation, pt has progressed from min A to Supervision assist for functional transfers including sit > stands and functional ambulation initially with RW then progressed to using rollator for all transfers. Pt now completes all bathing and dressing at a mod I-supervision level, and has significantly improved his endurance during self-care tasks including standing for grooming tasks with intermittent UE support on counter. However pt continues to demonstrate the need for safety and positioning cues during transfers, as well as during higher level self-care tasks such as shower transfers and shaving tasks. Pt will need to be a solid mod I for all self-care tasks as pt has had a change in d/c location, now with plan to d/c home alone with only intermittent supervision assist available and would benefit from continued progress to further his independence.  Patient continues to demonstrate the following deficits: muscle weakness and muscle joint tightness and decreased cardiorespiratoy endurance and therefore will continue to benefit from skilled OT intervention to enhance overall performance with BADL, iADL, and Reduce care partner burden.  Patient progressing toward long term goals..  Continue plan of care.  OT Short Term Goals Week 2:  OT Short Term Goal 1 (Week 2): STG = LTG due to [facility] Daniel Perkins 04/06/2021, 7:41 AM

## 2021-04-06 NOTE — Progress Notes (Signed)
PROGRESS NOTE   Subjective/Complaints: +diarrhea: metformin d/ced yesterday, I have d/ced Ensure and Miralax today He will no longer have daughter's support at home and would like to improve his balance prior to d/c- discussed with the team   ROS:  Pt denies SOB, abd pain, CP, N/V/C/, and vision changes, +diarrhea  Objective:   No results found. No results for input(s): WBC, HGB, HCT, PLT in the last 72 hours.  Recent Labs    04/05/21 0707  NA 134*  K 4.3  CL 102  CO2 25  GLUCOSE 120*  BUN 14  CREATININE 1.01  CALCIUM 9.2     Intake/Output Summary (Last 24 hours) at 04/06/2021 1133 Last data filed at 04/06/2021 0730 Gross per 24 hour  Intake 700 ml  Output 800 ml  Net -100 ml        Physical Exam: Vital Signs Blood pressure 117/80, pulse 73, temperature 98.3 F (36.8 C), temperature source Oral, resp. rate 16, height 5\' 7"  (1.702 m), weight 91.7 kg, SpO2 100 %.  Gen: no distress, normal appearing HEENT: oral mucosa pink and moist, NCAT Cardio: Reg rate Chest: normal effort, normal rate of breathing  Abdomen:   Extremities: No clubbing, cyanosis, or edema  Skin: Warm and dry.  Abdominal wound with central area packed, 1cm packed area, no drainage or surrounding erythema /induration  Musc: No edema in extremities.  No tenderness in extremities. No pain in his right temporal area with palpation Neuro: Alert . No cn findings.  Motor: Grossly 4+-5/5 throughout. No focal sensory  Assessment/Plan: 1. Functional deficits which require 3+ hours per day of interdisciplinary therapy in a comprehensive inpatient rehab setting. Physiatrist is providing close team supervision and 24 hour management of active medical problems listed below. Physiatrist and rehab team continue to assess barriers to discharge/monitor patient progress toward functional and medical goals  Care Tool:  Bathing    Body parts bathed  by patient: Right arm, Left arm, Chest, Abdomen, Front perineal area, Buttocks, Right upper leg, Left upper leg, Right lower leg, Left lower leg, Face   Body parts bathed by helper: Front perineal area, Buttocks     Bathing assist Assist Level: Supervision/Verbal cueing     Upper Body Dressing/Undressing Upper body dressing   What is the patient wearing?: Pull over shirt    Upper body assist Assist Level: Set up assist    Lower Body Dressing/Undressing Lower body dressing      What is the patient wearing?: Pants     Lower body assist Assist for lower body dressing: Set up assist     Toileting Toileting    Toileting assist Assist for toileting: Supervision/Verbal cueing     Transfers Chair/bed transfer  Transfers assist     Chair/bed transfer assist level: Supervision/Verbal cueing     Locomotion Ambulation   Ambulation assist      Assist level: Supervision/Verbal cueing Assistive device: Rollator Max distance: >378ft   Walk 10 feet activity   Assist     Assist level: Supervision/Verbal cueing Assistive device: Rollator   Walk 50 feet activity   Assist    Assist level: Supervision/Verbal cueing Assistive device: Rollator  Walk 150 feet activity   Assist Walk 150 feet activity did not occur: Safety/medical concerns (fatigue, weakness, decreased balance)  Assist level: Supervision/Verbal cueing Assistive device: Rollator    Walk 10 feet on uneven surface  activity   Assist Walk 10 feet on uneven surfaces activity did not occur: Safety/medical concerns (fatigue, weakness, decreased balance)   Assist level: Supervision/Verbal cueing Assistive device: Rollator   Wheelchair     Assist Is the patient using a wheelchair?: No Type of Wheelchair: Manual    Wheelchair assist level: Supervision/Verbal cueing Max wheelchair distance: >150'    Wheelchair 50 feet with 2 turns activity    Assist        Assist Level:  Supervision/Verbal cueing   Wheelchair 150 feet activity     Assist      Assist Level: Supervision/Verbal cueing   Blood pressure 117/80, pulse 73, temperature 98.3 F (36.8 C), temperature source Oral, resp. rate 16, height 5\' 7"  (1.702 m), weight 91.7 kg, SpO2 100 %.  Medical Problem List and Plan: 1.  Debility secondary to SBO with bowel perforation s/p NGT and TPN and persistent ileus and constipation on IV ABX for pelvic abscess  Continue CIR- PT and OT 2.  Impaired mobility: ambulating well: d/c Lovenox             -antiplatelet therapy: N/A 3. Headaches/Chronic neck pain/Pain Management: Takes gabapentin prn Decrease oxycodone to 5mg  q4H prn             -11/23 will schedule tylenol for headaches. Appear to be tension type.  11/28- denies pain- con't regimen 4. Mood: LCSW to follow for evaluation and support.              -antipsychotic agents: N/a 5. Neuropsych: This patient is capable of making decisions on his own behalf. 6. Skin/Wound Care: Routine  pressure  relief measures 7. Fluids/Electrolytes/Nutrition: Monitor I/Os 8. SBO with ileus s/p lysis of adhesions: Continue wound care bid with bid wet to dry dressing changes - moistened plain guaze packing strips or 2 inch kerlix             -11/23 will change abd wound dressing to calcium alginate 9. Abdominal wall seroma: Penrose drain placed 11/17 and to be removed in 2 weeks in clinic. 10. Wound infection: On Cefotetan, Rocephin and Flagyl thorough 11/28,             -- Repeat CT abdomen pelvis 11/24 and to consult ID for follow-up on antibiotic regimen--Looks like there is resolution of fluid collections, pt without abd pain. May d/c PICC.  11. HTN: well controlled, decrease Coreg to 3.125mg  BID.  12. T2DM: Hgb a1c- 6.0 and well controlled. Will monitor BS ac/hs and use SSI for elevated BS. Continue glargine insulin. --Used victoza 1.8, glucotrol and Jardiance. Metformin d/ced due to diarrhea.  D/c Ensure given  hyperglycemia 13. Gout: Stable on colchicine.  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  15. Hyperkalemia: 1 dose kayexalate 11/18, 3.9 on 11.21, monitor as needed.   1128- labs pending.  16. Hyponatremia: Na 134, monitor as needed.  17. Anemia: d/c Lovenox above given improved ambulation, monitor Hgb as needed.  18. Diarrhea: d/c metformin and Ensure 19. Disposition: HFU scheduled.   LOS: 12 days A FACE TO FACE EVALUATION WAS PERFORMED  12/24 Cynithia Hakimi 04/06/2021, 11:33 AM

## 2021-04-06 NOTE — Progress Notes (Signed)
Physical Therapy Weekly Progress Note  Patient Details  Name: Daniel Perkins MRN: [id number] Date of Birth: [birth year]  Beginning of progress report period: March 26, 2021 End of progress report period: April 06, 2021  Patient has met 2 of 7 long term goals. Pt's LOS recently extended due to change in D/C plan; pt now plans to D/C home alone rather than with his daughter. Pt is currently able to perform bed mobility mod I, transfers with rollator and supervision, gait >156f with rollator and supervision, and navigate 12 steps with 2 rails and supervision. Pt demonstrates excellent rollator safety awareness and equipment for D/C has already been delivered to pt's room.   Patient continues to demonstrate the following deficits muscle weakness, decreased cardiorespiratoy endurance, and decreased standing balance and decreased balance strategies and therefore will continue to benefit from skilled PT intervention to increase functional independence with mobility.  Patient progressing toward long term goals..  Continue plan of care.  PT Short Term Goals Week 1:  PT Short Term Goal 1 (Week 1): STG=LTG due to LOS Week 2:  PT Short Term Goal 1 (Week 2): STG=LTG due to extended LOS  Skilled Therapeutic Interventions/Progress Updates:  Ambulation/gait training;Discharge planning;Functional mobility training;Therapeutic Activities;Balance/vestibular training;Disease management/prevention;Neuromuscular re-education;Skin care/wound management;Therapeutic Exercise;Wheelchair propulsion/positioning;DME/adaptive equipment instruction;Pain management;Splinting/orthotics;UE/LE Strength taining/ROM;Community reintegration;Patient/family education;Stair training;UE/LE Coordination activities   Therapy Documentation Precautions:  Precautions Precautions: Other (comment) Precaution Comments: abdominal precautions Restrictions Weight Bearing Restrictions: No Other Position/Activity Restrictions: no  lifting >10lbs  Therapy/Group: Individual Therapy AAlfonse AlpersPT, DPT  04/06/2021, 7:19 AM

## 2021-04-07 LAB — GLUCOSE, CAPILLARY
Glucose-Capillary: 122 mg/dL — ABNORMAL HIGH (ref 70–99)
Glucose-Capillary: 114 mg/dL — ABNORMAL HIGH (ref 70–99)
Glucose-Capillary: 137 mg/dL — ABNORMAL HIGH (ref 70–99)
Glucose-Capillary: 143 mg/dL — ABNORMAL HIGH (ref 70–99)

## 2021-04-07 MED ORDER — OXYCODONE HCL 5 MG PO TABS: 5.0000 mg | ORAL_TABLET | Freq: Four times a day (QID) | ORAL | Status: DC | PRN

## 2021-04-07 NOTE — Patient Care Conference (Signed)
Inpatient RehabilitationTeam Conference and Plan of Care Update Date: 04/07/2021   Time: 11:41 AM    Patient Name: Daniel Perkins      Medical Record Number: [id number]  Date of Birth: [birth year] Sex: Male         Room/Bed: 5C13C/5C13C-01 Payor Info: Payor: Multimedia programmer / Plan: [facility] MEDICARE / Product Type: *No Product type* /    Admit Date/Time:  03/25/2021  5:59 PM  Primary Diagnosis:  Debility  Hospital Problems: Principal Problem:   Debility Active Problems:   SBO (small bowel obstruction) (HCC)   Hyperkalemia   Essential hypertension   Diabetes mellitus type 2 in nonobese [facility])    Expected Discharge Date: Expected Discharge Date: 04/09/21  Team Members Present: Physician leading conference: Dr. Sula Soda Social Worker Present: Lavera Guise, BSW Nurse Present: Kennyth Arnold, RN PT Present: Merry Lofty, PT OT Present: Other (comment) [facility] Cheyenne Adas, OT) PPS Coordinator present : Fae Pippin, SLP     Current Status/Progress Goal Weekly Team Focus  Bowel/Bladder   continent b/b  remain continent  assess toilet needs q 2 hours and prn   Swallow/Nutrition/ Hydration             ADL's   Distant supervision for UB/LB bathing/dressing at shower level; Mod I grooming standing at sink level; close supervision A functional transfers  Mod I  Activity tolernace, functional transfers, dynamic standing balance   Mobility   bed mobility mod I, transfers with rollator supervision, gait >1102ft with rollator supervision, 12 steps 2 rails supervision  Mod I  functional mobility, generalized strengthening, dynamic standing balance/coordination, gait training, NMR, endurance, D/C planning   Communication             Safety/Cognition/ Behavioral Observations            Pain   no pain  remain pain free  assess pain q shift and prn   Skin   abdominal wound, plain packing strips and gauze  no new breakdown  assess skin q shift and prn      Discharge Planning:  Patient discharging home with dtr, d/c delayed   Team Discussion: Hyponatremia and Hypoglycemia improving. Abdominal incision dressing changed as appropriate. Endurance improved. Patient wants to wait on additional assist from his brother before being discharged. Patient on target to meet rehab goals: yes, goals were upgraded to Mod I. Currently supervision with Rolator.   *See Care Plan and progress notes for long and short-term goals.   Revisions to Treatment Plan:  Adjusting medications and finalizing discharge plans.  Teaching Needs: Family education, medication management, skin/wound care, safety awareness, transfer/gait training, etc.  Current Barriers to Discharge: Decreased caregiver support, Wound care, Lack of/limited family support, Weight, and Medication compliance  Possible Resolutions to Barriers: Family education Follow up [facility] nursing for wound care Follow up HH/Outpatient PT/OT     Medical Summary               I attest that I was present, lead the team conference, and concur with the assessment and plan of the team.   Tennis Must 04/07/2021, 1:18 PM

## 2021-04-07 NOTE — Progress Notes (Signed)
Occupational Therapy Session Note  Patient Details  Name: Daniel Perkins MRN: [id number] Date of Birth: 11-05-1955  Today's Date: 04/07/2021 OT Individual Time: 0915-1000 OT Individual Time Calculation (min): 45 min    Short Term Goals: Week 2:  OT Short Term Goal 1 (Week 2): STG = LTG due to ELOS  Skilled Therapeutic Interventions/Progress Updates:    OT intervention with focus on TTB transfers, functional amb with Rollator, kitchen safety, home safety, activity tolerance, and safety awareness to increase independence with BADLs and IADLs. Pt amb with Rollator to tub room and practiced TTB tranfsers with supervision. Pt transitioned to kitchen and practiced obtaining items from various locations. Pt ptractived removing items from oven and use of rollator to transport. Pt states he will have someone assist with laundry since he does not have a dryer. Pt amb with Rollator to 4W before resting. Pt amb with rollator from 4W to his room without resting except when on elavator (standing rest). Pt remained in recliner with seat alarm activated. All needs within reach.   Therapy Documentation Precautions:  Precautions Precautions: Other (comment) Precaution Comments: abdominal precautions Restrictions Weight Bearing Restrictions: No Other Position/Activity Restrictions: no lifting >10lbs Pain:  Pt denies pain this morning     Therapy/Group: Individual Therapy  Rich Brave 04/07/2021, 2:54 PM

## 2021-04-07 NOTE — Progress Notes (Signed)
Physical Therapy Session Note  Patient Details  Name: Daniel Perkins MRN: [id number] Date of Birth: 07-23-1955  Today's Date: 04/07/2021 PT Individual Time: 1607-3710 PT Individual Time Calculation (min): 42 min   Short Term Goals: Week 2:  PT Short Term Goal 1 (Week 2): STG=LTG due to extended LOS  Skilled Therapeutic Interventions/Progress Updates:    Patient received sitting up in recliner, agreeable to PT. He denies pain. Patient ambulating with rollator and supervision to 4W gym. Decreased gait speed with minor trendelenburg noted. Patient completed Berg balance scale and scored a 46/56 indicating increased risk for falling and benefit from use of AD. Discussed results with patient and he verbalized understanding. Patient completing dynamic standing balance dual task standing on foam constructing blocks. Patient self-selecting WBOS during 1st bout- then NBOS 2nd bout. Patient ambulating back to his room with rollator and supervision- returning to recliner. Chair alarm on, call light within reach.   Therapy Documentation Precautions:  Precautions Precautions: Other (comment) Precaution Comments: abdominal precautions Restrictions Weight Bearing Restrictions: No Other Position/Activity Restrictions: no lifting >10lbs    Therapy/Group: Individual Therapy  Elizebeth Koller, PT, DPT, CBIS  04/07/2021, 7:40 AM

## 2021-04-07 NOTE — Progress Notes (Signed)
Occupational Therapy Session Note  Patient Details  Name: Daniel Perkins MRN: [id number] Date of Birth: 01-31-1956  Today's Date: 04/07/2021 OT Individual Time: 8756-4332 OT Individual Time Calculation (min): 72 min    Short Term Goals: Week 1:  OT Short Term Goal 1 (Week 1): STG = LTG due to ELOS Week 2:  OT Short Term Goal 1 (Week 2): STG = LTG due to ELOS  Skilled Therapeutic Interventions/Progress Updates:  Patient met lying supine in bed in agreement with OT treatment session. 0/10 pain reported at rest and with activity. Treatment session with focus on self-care re-education, functional mobility, ADL transfers and activity tolerance. Patient completed supine to EOB transfer with Mod I. Sit to stand from EOB with supervision A. Patient then able to gather ADL items in prep for bathing at shower level with supervision A. Walk-in shower transfer and UB/LB bathing/dressing with supervision A. Seated on rollator at sink level, patient completed 2/3 grooming tasks with Mod I. Patient then completed bed making task with Min guard to maintain unsupported standing balance. Education on energy conservation and utilization of seated rest breaks with patient requiring 3 throughout. Patient expressed verbal understanding. Patient with moderate fatigue at conclusion of task. Functional mobility from hospital room to and from 4w rehab gym with cues for upright posture. Patient denies forward flexed trunk at baseline. Seated on mat table OT provided education on BLE stretches to facilitate soft tissue elongation. Patient completed 3 seated stretches x2 reps with 15-30 sec hold. Focus then shifted to unsupported static/dynamic standing balance on blue foam with first with eyes open, then with vision occluded. Min guard for safety. Session concluded with patient seated in recliner with call bell within reach, chair alarm activated and all needs met.   Therapy Documentation Precautions:   Precautions Precautions: Other (comment) Precaution Comments: abdominal precautions Restrictions Weight Bearing Restrictions: No Other Position/Activity Restrictions: no lifting >10lbs General:   Therapy/Group: Individual Therapy  Demir Titsworth R Howerton-Davis 04/07/2021, 6:57 AM

## 2021-04-07 NOTE — Progress Notes (Signed)
Patient ID: Daniel Perkins, male   DOB: Nov 24, 1955, 65 y.o.   MRN: [id number] Team Conference Report to Patient/Family  Team Conference discussion was reviewed with the patient and caregiver, including goals, any changes in plan of care and target discharge date.  Patient and caregiver express understanding and are in agreement.  The patient has a target discharge date of 04/09/21.  SW met with pt and called daughter. Informed of not being able to extend patient until next week due to meeting goals. Pt and daughter understood. Daughter will pick pt up for d/c on Friday.   Dyanne Iha 04/07/2021, 2:17 PM

## 2021-04-07 NOTE — Progress Notes (Signed)
PROGRESS NOTE   Subjective/Complaints: He requests that d/c be extended until Monday but based upon discussion with team he will meet goals earlier than that so we can not justify keeping him that long.  Ambulating well in hallway today   ROS:  Pt denies SOB, abd pain, CP, N/V/C/, and vision changes, +diarrhea  Objective:   No results found. No results for input(s): WBC, HGB, HCT, PLT in the last 72 hours.  Recent Labs    04/05/21 0707  NA 134*  K 4.3  CL 102  CO2 25  GLUCOSE 120*  BUN 14  CREATININE 1.01  CALCIUM 9.2     Intake/Output Summary (Last 24 hours) at 04/07/2021 1345 Last data filed at 04/07/2021 1300 Gross per 24 hour  Intake 684 ml  Output 500 ml  Net 184 ml        Physical Exam: Vital Signs Blood pressure 110/72, pulse 81, temperature 98.6 F (37 C), temperature source Oral, resp. rate 18, height 5\' 7"  (1.702 m), weight 91.9 kg, SpO2 99 %.  Gen: no distress, normal appearing HEENT: oral mucosa pink and moist, NCAT Cardio: Reg rate Chest: normal effort, normal rate of breathing   Abdomen:   Extremities: No clubbing, cyanosis, or edema  Skin: Warm and dry.  Abdominal wound with central area packed, 1cm packed area, no drainage or surrounding erythema /induration  Musc: No edema in extremities.  No tenderness in extremities. No pain in his right temporal area with palpation Neuro: Alert . No cn findings.  Motor: Grossly 4+-5/5 throughout. No focal sensory  Assessment/Plan: 1. Functional deficits which require 3+ hours per day of interdisciplinary therapy in a comprehensive inpatient rehab setting. Physiatrist is providing close team supervision and 24 hour management of active medical problems listed below. Physiatrist and rehab team continue to assess barriers to discharge/monitor patient progress toward functional and medical goals  Care Tool:  Bathing    Body parts bathed by  patient: Right arm, Left arm, Chest, Abdomen, Front perineal area, Buttocks, Right upper leg, Left upper leg, Right lower leg, Left lower leg, Face   Body parts bathed by helper: Front perineal area, Buttocks     Bathing assist Assist Level: Supervision/Verbal cueing     Upper Body Dressing/Undressing Upper body dressing   What is the patient wearing?: Pull over shirt    Upper body assist Assist Level: Set up assist    Lower Body Dressing/Undressing Lower body dressing      What is the patient wearing?: Pants     Lower body assist Assist for lower body dressing: Set up assist     Toileting Toileting    Toileting assist Assist for toileting: Supervision/Verbal cueing     Transfers Chair/bed transfer  Transfers assist     Chair/bed transfer assist level: Supervision/Verbal cueing     Locomotion Ambulation   Ambulation assist      Assist level: Supervision/Verbal cueing Assistive device: Rollator Max distance: >31ft   Walk 10 feet activity   Assist     Assist level: Supervision/Verbal cueing Assistive device: Rollator   Walk 50 feet activity   Assist    Assist level: Supervision/Verbal cueing Assistive device:  Rollator    Walk 150 feet activity   Assist Walk 150 feet activity did not occur: Safety/medical concerns (fatigue, weakness, decreased balance)  Assist level: Supervision/Verbal cueing Assistive device: Rollator    Walk 10 feet on uneven surface  activity   Assist Walk 10 feet on uneven surfaces activity did not occur: Safety/medical concerns (fatigue, weakness, decreased balance)   Assist level: Supervision/Verbal cueing Assistive device: Rollator   Wheelchair     Assist Is the patient using a wheelchair?: No Type of Wheelchair: Manual    Wheelchair assist level: Supervision/Verbal cueing Max wheelchair distance: >150'    Wheelchair 50 feet with 2 turns activity    Assist        Assist Level:  Supervision/Verbal cueing   Wheelchair 150 feet activity     Assist      Assist Level: Supervision/Verbal cueing   Blood pressure 110/72, pulse 81, temperature 98.6 F (37 C), temperature source Oral, resp. rate 18, height 5\' 7"  (1.702 m), weight 91.9 kg, SpO2 99 %.  Medical Problem List and Plan: 1.  Debility secondary to SBO with bowel perforation s/p NGT and TPN and persistent ileus and constipation on IV ABX for pelvic abscess  Continue CIR- PT and OT 2.  Impaired mobility: ambulating well: d/c Lovenox             -antiplatelet therapy: N/A 3. Headaches/Chronic neck pain/Pain Management: Takes gabapentin prn Decrease oxycodone to 5mg  q6H prn             -11/23 will schedule tylenol for headaches. Appear to be tension type.  11/28- denies pain- con't regimen 4. Mood: LCSW to follow for evaluation and support.              -antipsychotic agents: N/a 5. Neuropsych: This patient is capable of making decisions on his own behalf. 6. Skin/Wound Care: Routine  pressure  relief measures 7. Fluids/Electrolytes/Nutrition: Monitor I/Os 8. SBO with ileus s/p lysis of adhesions: Continue wound care bid with bid wet to dry dressing changes - moistened plain guaze packing strips or 2 inch kerlix            Continue calcium alginate dressing 9. Abdominal wall seroma: Penrose drain placed 11/17 and to be removed in 2 weeks in clinic. 10. Wound infection: PICC d/ced and antibiotics completed 11. HTN: well controlled, decrease Coreg to 3.125mg  BID.  12. T2DM: Hgb a1c- 6.0 and well controlled. Will monitor BS ac/hs and use SSI for elevated BS. Continue glargine insulin. --Used victoza 1.8, glucotrol and Jardiance. Metformin d/ced due to diarrhea.  D/c Ensure given hyperglycemia 13. Gout: Stable on colchicine.  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  15. Hyperkalemia: 1 dose kayexalate 11/18, 3.9 on 11.21, monitor as needed.   1128- labs  pending.  16. Hyponatremia: Na 134, monitor as needed.  17. Anemia: d/c Lovenox above given improved ambulation, monitor Hgb as needed.  18. Diarrhea: d/c metformin and Ensure 19. Disposition: HFU scheduled.   LOS: 13 days A FACE TO FACE EVALUATION WAS PERFORMED  Omaya Nieland P Lorella Gomez 04/07/2021, 1:45 PM

## 2021-04-07 NOTE — Progress Notes (Signed)
Physical Therapy Session Note  Patient Details  Name: Daniel Perkins MRN: [id number] Date of Birth: 11-19-1955  Today's Date: 04/07/2021 PT Individual Time: 1132-1159 PT Individual Time Calculation (min): 27 min   Short Term Goals: Week 2:  PT Short Term Goal 1 (Week 2): STG=LTG due to extended LOS  Skilled Therapeutic Interventions/Progress Updates:     Pt seated in recliner at start of session - agreeable to PT tx without reports of pain. Requests to use toilet prior to leaving room. Sit<>stand to 4-wheeled rollator with supervision - demonstrates appropriate safety awareness by locking brakes without cues needed. Ambulates to bathroom within his room with supervision and rollator - continent of bladder while standing - noted relying on UE support on grab bar while doing so. Pt then ambulates with supervision and rollator sinkside to complete hand hygiene - noted good standing balance while reaching forward outside BOS to wash hands. Pt ambulated to [facility] rehab gym with supervision, ~62ft. Instructed in the following standing balance task -alternating toe taps to cones, unsupported, minA for balance -standing on compliant surfaces (blue airex foam pad) with feet together, feet apart, eyes open/closed, unsupported, minA for balance --standing on compliant surfaces (blue airex foam pad), shldr press, chest press, shldr row with 3# dowel rod, minA for balance  Pt ambulated back to his room with supervision and his rollator - remained seated in recliner with chair alarm on, all needs in reach. Requesting Ensure and ice - LPN made aware.  Therapy Documentation Precautions:  Precautions Precautions: Other (comment) Precaution Comments: abdominal precautions Restrictions Weight Bearing Restrictions: No Other Position/Activity Restrictions: no lifting >10lbs General:    Therapy/Group: Individual Therapy  Orrin Brigham 04/07/2021, 7:42 AM

## 2021-04-08 LAB — GLUCOSE, CAPILLARY
Glucose-Capillary: 115 mg/dL — ABNORMAL HIGH (ref 70–99)
Glucose-Capillary: 109 mg/dL — ABNORMAL HIGH (ref 70–99)
Glucose-Capillary: 127 mg/dL — ABNORMAL HIGH (ref 70–99)
Glucose-Capillary: 133 mg/dL — ABNORMAL HIGH (ref 70–99)

## 2021-04-08 MED ORDER — MAGNESIUM HYDROXIDE 400 MG/5ML PO SUSP
45.0000 mL | Freq: Once | ORAL | Status: AC
  Administered 2021-04-08: 45 mL via ORAL
  Filled 2021-04-08: qty 60

## 2021-04-08 MED ORDER — OXYCODONE HCL 5 MG PO TABS: 5.0000 mg | ORAL_TABLET | Freq: Three times a day (TID) | ORAL | Status: DC | PRN

## 2021-04-08 MED ORDER — SENNOSIDES-DOCUSATE SODIUM 8.6-50 MG PO TABS: 2.0000 | ORAL_TABLET | Freq: Once | ORAL | Status: DC

## 2021-04-08 MED ORDER — SENNOSIDES-DOCUSATE SODIUM 8.6-50 MG PO TABS
2.0000 | ORAL_TABLET | Freq: Every day | ORAL | Status: DC
  Administered 2021-04-08: 2 via ORAL
  Filled 2021-04-08: qty 2

## 2021-04-08 NOTE — Progress Notes (Signed)
PROGRESS NOTE   Subjective/Complaints: No new complaints this morning Discussed discharge date tomorrow and he is understanding Commended on progress!!   ROS:  Pt denies SOB, abd pain, CP, N/V/C/, and vision changes, +diarrhea  Objective:   No results found. No results for input(s): WBC, HGB, HCT, PLT in the last 72 hours.  No results for input(s): NA, K, CL, CO2, GLUCOSE, BUN, CREATININE, CALCIUM in the last 72 hours.    Intake/Output Summary (Last 24 hours) at 04/08/2021 1255 Last data filed at 04/08/2021 0730 Gross per 24 hour  Intake 639 ml  Output 800 ml  Net -161 ml        Physical Exam: Vital Signs Blood pressure 129/80, pulse 82, temperature 98.1 F (36.7 C), temperature source Oral, resp. rate 17, height 5\' 7"  (1.702 m), weight 90 kg, SpO2 98 %.  Gen: no distress, normal appearing HEENT: oral mucosa pink and moist, NCAT Cardio: Reg rate Chest: normal effort, normal rate of breathing   Abdomen:   Extremities: No clubbing, cyanosis, or edema  Skin: Warm and dry.  Abdominal wound with central area packed, 1cm packed area, no drainage or surrounding erythema /induration  Musc: No edema in extremities.  No tenderness in extremities. No pain in his right temporal area with palpation Neuro: Alert . No cn findings.  Motor: Grossly 4+-5/5 throughout. No focal sensory  Assessment/Plan: 1. Functional deficits which require 3+ hours per day of interdisciplinary therapy in a comprehensive inpatient rehab setting. Physiatrist is providing close team supervision and 24 hour management of active medical problems listed below. Physiatrist and rehab team continue to assess barriers to discharge/monitor patient progress toward functional and medical goals  Care Tool:  Bathing    Body parts bathed by patient: Right arm, Left arm, Chest, Abdomen, Front perineal area, Buttocks, Right upper leg, Left upper leg, Right  lower leg, Left lower leg, Face   Body parts bathed by helper: Front perineal area, Buttocks     Bathing assist Assist Level: Supervision/Verbal cueing     Upper Body Dressing/Undressing Upper body dressing   What is the patient wearing?: Pull over shirt    Upper body assist Assist Level: Set up assist    Lower Body Dressing/Undressing Lower body dressing      What is the patient wearing?: Pants     Lower body assist Assist for lower body dressing: Set up assist     Toileting Toileting    Toileting assist Assist for toileting: Supervision/Verbal cueing     Transfers Chair/bed transfer  Transfers assist     Chair/bed transfer assist level: Independent with assistive device Chair/bed transfer assistive device: Other (rollator)   Locomotion Ambulation   Ambulation assist      Assist level: Independent with assistive device Assistive device: Rollator Max distance: >330ft   Walk 10 feet activity   Assist     Assist level: Independent with assistive device Assistive device: Rollator   Walk 50 feet activity   Assist    Assist level: Independent with assistive device Assistive device: Rollator    Walk 150 feet activity   Assist Walk 150 feet activity did not occur: Safety/medical concerns (fatigue, weakness, decreased  balance)  Assist level: Independent with assistive device Assistive device: Rollator    Walk 10 feet on uneven surface  activity   Assist Walk 10 feet on uneven surfaces activity did not occur: Safety/medical concerns (fatigue, weakness, decreased balance)   Assist level: Independent with assistive device Assistive device: Rollator   Wheelchair     Assist Is the patient using a wheelchair?: No Type of Wheelchair: Manual Wheelchair activity did not occur: N/A  Wheelchair assist level: Supervision/Verbal cueing Max wheelchair distance: >150'    Wheelchair 50 feet with 2 turns activity    Assist     Wheelchair 50 feet with 2 turns activity did not occur: N/A   Assist Level: Supervision/Verbal cueing   Wheelchair 150 feet activity     Assist  Wheelchair 150 feet activity did not occur: N/A   Assist Level: Supervision/Verbal cueing   Blood pressure 129/80, pulse 82, temperature 98.1 F (36.7 C), temperature source Oral, resp. rate 17, height 5\' 7"  (1.702 m), weight 90 kg, SpO2 98 %.  Medical Problem List and Plan: 1.  Debility secondary to SBO with bowel perforation s/p NGT and TPN and persistent ileus and constipation on IV ABX for pelvic abscess  Continue CIR- PT and OT 2.  Impaired mobility: ambulating well: d/c Lovenox             -antiplatelet therapy: N/A 3. Headaches/Chronic neck pain/Pain Management: Takes gabapentin prn Decrease oxycodone to 5mg  q8H prn             -11/23 will schedule tylenol for headaches. Appear to be tension type.  11/28- denies pain- con't regimen 4. Mood: LCSW to follow for evaluation and support.              -antipsychotic agents: N/a 5. Neuropsych: This patient is capable of making decisions on his own behalf. 6. Skin/Wound Care: Routine  pressure  relief measures 7. Fluids/Electrolytes/Nutrition: Monitor I/Os 8. SBO with ileus s/p lysis of adhesions: Continue wound care bid with bid wet to dry dressing changes - moistened plain guaze packing strips or 2 inch kerlix            Continue calcium alginate dressing 9. Abdominal wall seroma: Penrose drain placed 11/17 and to be removed in 2 weeks in clinic. 10. Wound infection: PICC d/ced and antibiotics completed 11. HTN: well controlled, decrease Coreg to 3.125mg  BID.  12. T2DM: Hgb a1c- 6.0 and well controlled. Will monitor BS ac/hs and use SSI for elevated BS. Continue glargine insulin. --Used victoza 1.8, glucotrol and Jardiance. Metformin d/ced due to diarrhea.  D/c Ensure given hyperglycemia 13. Gout: Stable on colchicine.  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  15. Hyperkalemia: 1 dose kayexalate 11/18, 3.9 on 11.21, monitor as needed.   1128- labs pending.  16. Hyponatremia: Na 134, monitor as needed.  17. Anemia: d/c Lovenox above given improved ambulation, monitor Hgb as needed.  18. Diarrhea: d/c metformin and Ensure 19. Disposition: HFU scheduled.   LOS: 14 days A FACE TO FACE EVALUATION WAS PERFORMED  12/17 Laiya Wisby 04/08/2021, 12:55 PM

## 2021-04-08 NOTE — Progress Notes (Signed)
Occupational Therapy Session Note  Patient Details  Name: Daniel Perkins MRN: [id number] Date of Birth: 1955-05-31  Today's Date: 04/08/2021 OT Individual Time: 1405-1500 OT Individual Time Calculation (min): 55 min    Short Term Goals: Week 2:  OT Short Term Goal 1 (Week 2): STG = LTG due to ELOS  Skilled Therapeutic Interventions/Progress Updates:    Pt sitting in recliner with mild soreness in his abdomen, as wound packing just occurred. RN entered room to deliver tylenol which pt reported alleviated pain. Pt requested to clean up his room/pack belongings. He used his rollator to move about the room with mod I. Occasional cueing provided for safety tips and general fall risk reduction. He had questions re learning to pack wounds and OT followed up with LPN who reported he will provide all wound education in the morning. Pt was able to fold clothes and pack bags from EOB with mod I. He then used his rollator to ambulate 300+ ft to [employer]. He was given edu/demo re floor transfer and fall recovery- including assessing for injury post fall and importance of always having a cell phone on him. He returned the demo with (S), good carryover of all edu. Pt completed functional mobility up/down a ramp with cueing provided for reducing stride length on descent of ramp with mod I overall. Pt returned to his room and was left sitting up in the recliner with all needs met.   Therapy Documentation Precautions:  Precautions Precautions: Other (comment) Precaution Comments: abdominal precautions Restrictions Weight Bearing Restrictions: No Other Position/Activity Restrictions: no lifting >10lbs  Therapy/Group: Individual Therapy  Curtis Sites 04/08/2021, 6:19 AM

## 2021-04-08 NOTE — Progress Notes (Signed)
Occupational Therapy Session Note  Patient Details  Name: Daniel Perkins MRN: [id number] Date of Birth: [birth year]  Today's Date: 04/08/2021 OT Individual Time: 0935-1000 OT Individual Time Calculation (min): 25 min    Short Term Goals: Week 2:  OT Short Term Goal 1 (Week 2): STG = LTG due to ELOS  Skilled Therapeutic Interventions/Progress Updates:    Pt received in recliner concerned that tomorrow would be inconvienent for his daughter to pick him up.  PT confirmed his dtr is coming tomorrow for d/c but pt wanted to call his dtr.  Dtr spoke with me and said she is coming tomorrow, so pt was more at ease.   He was agreeable to working on endurance and LE strength with ambulation. Pt stood and ambulated down the hall way to the next unit for over 300+feet with rollater with MOD I.  Pt resting in recliner with all needs met prior to next PT session.   Therapy Documentation Precautions:  Precautions Precautions: Other (comment) Precaution Comments: abdominal precautions Restrictions Weight Bearing Restrictions: No Other Position/Activity Restrictions: no lifting >10lbs Pain:  No c/o pain   Therapy/Group: Individual Therapy  [place] 04/08/2021, 1:04 PM

## 2021-04-08 NOTE — Progress Notes (Signed)
Physical Therapy Session Note  Patient Details  Name: Daniel Perkins MRN: [id number] Date of Birth: [birth year]  Today's Date: 04/08/2021 PT Individual Time: 0800-0857 PT Individual Time Calculation (min): 57 min   Short Term Goals: Week 1:  PT Short Term Goal 1 (Week 1): STG=LTG due to LOS Week 2:  PT Short Term Goal 1 (Week 2): STG=LTG due to extended LOS  Skilled Therapeutic Interventions/Progress Updates:   Received pt sitting EOB prepared to get dressed, pt agreeable to PT treatment, and denied any pain during session. Session with emphasis on functional mobility, dressing, generalized strengthening, dynamic standing balance/coordination, gait training, and improved activity tolerance. Pt donned pants sitting EOB with set up assist and stood to pull pants over hips mod I. Donned shoes in figure four position with set up assist and pull over shirt with set up assist. Pt ambulated 156ft x 2 and >34ft x 2 mod I with rollator to/from 4W dayroom. Pt stepped on/off LiteGait Treadmill with BUE support and supervision and worked on gait training for the following time frames: -5 minutes and 4 seconds on incline 2 for 453ft at 1.26mph with BUE support - pt demonstrated improvements in bilateral stride length and upright posture.  -4 minutes and 13 seconds on incline 2 for 244ft at 0.27mph with BUE support; challenged pt with stepping over beanbags to simulate stepping over obstacles at home - pt with generalized unsteadiness but no true LOB -L sidestepping for 2 minutes and 30 seconds for 143ft at 0.51mph with BUE support fading to no UE support with emphasis on hip abductor strengthening  Pt required seated rest breaks throughout ambulation bouts, but overall demonstrates excellent improvements in endurance. Concluded session with pt sitting in recliner, needs within reach, and chair pad alarm on.   Therapy Documentation Precautions:  Precautions Precautions: Other (comment) Precaution Comments:  abdominal precautions Restrictions Weight Bearing Restrictions: No Other Position/Activity Restrictions: no lifting >10lbs  Therapy/Group: Individual Therapy Martin Majestic PT, DPT   04/08/2021, 7:23 AM

## 2021-04-08 NOTE — Progress Notes (Signed)
Physical Therapy Session Note  Patient Details  Name: Daniel Perkins MRN: [id number] Date of Birth: 30-Jun-1955  Today's Date: 04/08/2021 PT Individual Time: 1015-1100 PT Individual Time Calculation (min): 45 min   Short Term Goals: Week 2:  PT Short Term Goal 1 (Week 2): STG=LTG due to extended LOS  Skilled Therapeutic Interventions/Progress Updates: Pt presented in recliner agreeable to therapy. Pt denies pain during session. Session to focus on higher level balance activities in preparation for d/c. Pt ambulated to 4th floor gyn with rollator and supervision without rest break. Pt set up in [facility] and participated in dynamic lunges to target anteriorly and laterally. Pt noted to have x 1 posterior LOB when returning to neutral with pt able to correct with stepping. Pt also participated in anterior resistive leans with use of Theraband then with PTA gently releasing band causing pt to step forward. Pt able to perform without LOB demonstrating fair control. Pt removed from [facility] and participated in Sit to stand while standing on balance board x 5. Pt noted to have x 1 posterior LOB requiring minA for correction. Pt then ambulated to day room and participated in several rounds of corn hole while standing on 4in block. Pt able to perform dynamic reaching with moderate challenges and no LOB. After brief rest pt returned to room on 5th floor using rollator and no rest breaks. Pt returned to recliner at end of session and left with call bell within reach and current needs met.      Therapy Documentation Precautions:  Precautions Precautions: Other (comment) Precaution Comments: abdominal precautions Restrictions Weight Bearing Restrictions: No Other Position/Activity Restrictions: no lifting >10lbs General:   Vital Signs: Therapy Vitals Temp: 98.4 F (36.9 C) Temp Source: Oral Pulse Rate: 82 Resp: 16 BP: 116/84 Patient Position (if appropriate): Sitting Oxygen Therapy SpO2: 97  % O2 Device: Room Air Pain:   Mobility:   Locomotion :    Trunk/Postural Assessment :    Balance:   Exercises:   Other Treatments:      Therapy/Group: Individual Therapy  Jimmie Rueter 04/08/2021, 4:46 PM

## 2021-04-08 NOTE — Progress Notes (Signed)
Subjective: CC: Patient supposed to follow up in the office today for penrose drain removal but was still in rehab.  Seen in rehab. Notes no abdominal pain. Tolerating regular diet without n/v. Passing flatus. Last BM 11/28.  Last cbc w/ wbc wnl. Had a repeat CT 11/24 that showed interval resolution of previous fluid collections anterior to the rectum and in the anterior midline soft tissues along the surgical incision.  Objective: Vital signs in last 24 hours: Temp:  [98 F (36.7 C)-98.1 F (36.7 C)] 98.1 F (36.7 C) (12/01 0548) Pulse Rate:  [82-87] 82 (12/01 0548) Resp:  [16-17] 17 (11/30 2005) BP: (100-129)/(60-80) 129/80 (12/01 0548) SpO2:  [97 %-99 %] 98 % (12/01 0548) Weight:  [90 kg] 90 kg (12/01 0500) Last BM Date: 04/05/21  Intake/Output from previous day: 11/30 0701 - 12/01 0700 In: 621 [P.O.:621] Out: 550 [Urine:550] Intake/Output this shift: Total I/O In: 240 [P.O.:240] Out: 250 [Urine:250]  PE: Gen:  Alert, NAD, pleasant Pulm:  Normal rate and effort  Abd: Soft, ND, NT +BS, midline wound healing well with penrose drain note in inferior aspect of the wound. There is also a very small skin opening along the most superior aspect of the wound as well (there is a picture from 11/26 where this can be visualized). No drainage. There is healthy grannulation tissue at the base. Would probed with sterile cotton swab without any noted tracking/tunneling. Penrose drain removed. Superior skin opening after penrose drain removal is <0.5cm in length, very superficial and without any tunneling or tracking expect where penrose drain that was just removed was located. Inferior aspect of wound with small opening with healthy granulation tissue at the base and no tunneling or tracking  expect where penrose drain that was just removed was located. Wound packed with wtd dressing.  Ext:  No LE edema or calf tenderness Psych: A&Ox3  Skin: no rashes noted, warm and dry  Lab  Results:  No results for input(s): WBC, HGB, HCT, PLT in the last 72 hours. BMET No results for input(s): NA, K, CL, CO2, GLUCOSE, BUN, CREATININE, CALCIUM in the last 72 hours. PT/INR No results for input(s): LABPROT, INR in the last 72 hours. CMP     Component Value Date/Time   NA 134 (L) 04/05/2021 0707   NA 137 02/10/2021 1538   K 4.3 04/05/2021 0707   CL 102 04/05/2021 0707   CO2 25 04/05/2021 0707   GLUCOSE 120 (H) 04/05/2021 0707   BUN 14 04/05/2021 0707   BUN 13 02/10/2021 1538   CREATININE 1.01 04/05/2021 0707   CREATININE 1.22 06/30/2014 1634   CALCIUM 9.2 04/05/2021 0707   PROT 7.9 03/26/2021 0608   PROT 7.2 12/06/2018 1648   ALBUMIN 2.5 (L) 03/26/2021 0608   ALBUMIN 4.2 12/06/2018 1648   AST 41 03/26/2021 0608   ALT 38 03/26/2021 0608   ALKPHOS 136 (H) 03/26/2021 0608   BILITOT 0.9 03/26/2021 0608   BILITOT 0.3 12/06/2018 1648   GFRNONAA >60 04/05/2021 0707   GFRNONAA 78 10/23/2013 1554   GFRAA 86 06/09/2020 1508   GFRAA >89 10/23/2013 1554   Lipase  No results found for: LIPASE  Studies/Results: No results found.  Anti-infectives: Anti-infectives (From admission, onward)    Start     Dose/Rate Route Frequency Ordered Stop   03/25/21 2200  cefTRIAXone (ROCEPHIN) 2 g in sodium chloride 0.9 % 100 mL IVPB        2 g 200 mL/hr over  30 Minutes Intravenous Every 24 hours 03/25/21 1803 04/02/21 2253   03/25/21 2000  metroNIDAZOLE (FLAGYL) tablet 500 mg        500 mg Oral Every 12 hours 03/25/21 1803 04/02/21 2125        Assessment/Plan POD#34 s/p diagnostic laparoscopy, lysis of single adhesive band in the left lower quadrant with resolution of bowel obstruction, mini laparotomy with primary repair of 2 mm small bowel perforation for sbo due to a single adhesive band in the left lower quadrant from a sigmoid epiploic appendage on 03/05/21 Dr. Fredricka Bonine - Penrose drain placed on 11/17 by our team. Last cbc w/ wbc wnl. Had a repeat CT 11/24 that showed interval  resolution of previous fluid collections anterior to the rectum and in the anterior midline soft tissues along the surgical incision.  - Penrose drain was removed today. Daily wtd. Discussed teaching with patient and his RN. - Patient is doing well. Tolerating regular diet without, n/v, having bowel function, and NT on exam. Will bring back in 2-3 weeks for a wound check.    LOS: 14 days    Jacinto Halim , [facility] 04/08/2021, 12:44 PM Please see Amion for pager number during day hours 7:00am-4:30pm

## 2021-04-08 NOTE — Discharge Summary (Signed)
Physician Discharge Summary  Patient ID: Daniel Perkins MRN: [id number] DOB/AGE: [birth year] 64 y.o.  Admit date: 03/25/2021 Discharge date: 04/09/2021  Discharge Diagnoses:  Principal Problem:   Debility Active Problems:   Diabetes mellitus due to underlying condition, uncontrolled, with hyperglycemia (HCC)   HYPERTENSION   Essential hypertension   Diabetes mellitus type 2 in nonobese (HCC)   Hx SBO   Wound dehiscence, surgical   Discharged Condition: good  Significant Diagnostic Studies: CT ABDOMEN PELVIS W CONTRAST  Result Date: 04/01/2021 CLINICAL DATA:  Lower abdominal pain EXAM: CT ABDOMEN AND PELVIS WITH CONTRAST TECHNIQUE: Multidetector CT imaging of the abdomen and pelvis was performed using the standard protocol following bolus administration of intravenous contrast. CONTRAST:  OMNIPAQUE IOHEXOL 300 MG/ML  SOLN COMPARISON:  CT abdomen and pelvis 03/19/2021 FINDINGS: Lower chest: No acute process identified. Breathing motion and chronic changes of the lungs. Hepatobiliary: Liver is normal in size and contour with no suspicious mass identified. Small amount of increased density material in the dependent gallbladder may represent stones/sludge. No biliary ductal dilatation. Pancreas: Unremarkable. No pancreatic ductal dilatation or surrounding inflammatory changes. Spleen: Normal in size without focal abnormality. Adrenals/Urinary Tract: Adrenal glands are unremarkable. Kidneys are normal, without renal calculi, focal lesion, or hydronephrosis. Bladder is unremarkable. Stomach/Bowel: Small hiatal hernia. No bowel obstruction, free air or pneumatosis. Colonic diverticulosis. Large amount of retained fecal material throughout the colon. Appendix is normal. Vascular/Lymphatic: Aortic atherosclerosis. No enlarged abdominal or pelvic lymph nodes. Reproductive: Prostate gland is mildly enlarged. Other: Interval resolution of the previous fluid collection anterior to the rectum and  interval resolution and surgical changes of the previous fluid collection in the anterior subcutaneous tissues along the surgical incision. No ascites. Musculoskeletal: No suspicious bony lesions identified. IMPRESSION: 1. Interval resolution of previous fluid collections anterior to the rectum and in the anterior midline soft tissues along the surgical incision. 2. Colonic diverticulosis. 3. Small hiatal hernia. 4. Other chronic findings as described. Electronically Signed   By: Jannifer Hick M.D.   On: 04/01/2021 13:08    Labs:  Basic Metabolic Panel: BMP Latest Ref Rng & Units 04/05/2021 03/29/2021 03/26/2021  Glucose 70 - 99 mg/dL 270(W) 237(S) 283(T)  BUN 8 - 23 mg/dL 14 17 18   Creatinine 0.61 - 1.24 mg/dL 5.17 6.16  BUN/Creat Ratio 10 - 24 - - -  Sodium 135 - 145 mmol/L 134(L) 134(L) 134(L)  Potassium 3.5 - 5.1 mmol/L 4.3 3.9 5.4(H)  Chloride 98 - 111 mmol/L 102 101 101  CO2 22 - 32 mmol/L 25 26 25   Calcium 8.9 - 10.3 mg/dL 9.2 8.9 9.6     CBC: CBC Latest Ref Rng & Units 04/09/2021 03/29/2021 03/26/2021  WBC 4.0 - 10.5 K/uL 4.3 5.5 6.0  Hemoglobin 13.0 - 17.0 g/dL 11.7(L) 10.8(L) 11.9(L)  Hematocrit 39.0 - 52.0 % 36.9(L) 35.5(L) 37.2(L)  Platelets 150 - 400 K/uL 263 342 367     CBG: Recent Labs  Lab 04/08/21 0604 04/08/21 1124 04/08/21 1608 04/08/21 2212 04/09/21 0553  GLUCAP 115* 109* 127* 133* 116*    Brief HPI:   Daniel Perkins is a 65 y.o. male with history of CAD, gout, CKD 2, T2DM who was admitted on 03/04/2021 with 24-hour history of nausea vomiting and abdominal pain due to SBO.  NG tube was placed for decompression without good results and he was taken to the OR on 10/28 for diagnostic laparotomy with lysis of bed LLQ and main laparotomy with primary repair of 2 small bowel  perforation by Dr. Windle Guard.  Postop NG tube remain in place due to persistent ileus and he required TNA for nutritional support.  Follow-up CT abdomen showed nonspecific small bowel  thickening with small pelvic fluid collection therefore Zosyn was added on 11/03.    Repeat CT showed new 3.3 cm loculated fluid collection and infectious disease recommended changing Zosyn to ceftriaxone and metronidazole.  IR was consulted for input on aspiration felt that there was no role for aspiration with decrease in size of fluid collection.  He still on abdominal pain had resolved and diet was slowly being advanced.  Staples were removed on 11/13.  Penrose drain was placed in open wound on 11/17 to prevent fluid collection.  Recommendations were to continue IV antibiotics for 2 to 3 weeks repeat CT abdomen pelvis around 11/24.  Therapy was ongoing revealing deficits in mobility and ADLs.  CIR was recommended due to functional decline   Hospital Course: Daniel Perkins was admitted to rehab 03/25/2021 for inpatient therapies to consist of PT and OT at least three hours five days a week. Past admission physiatrist, therapy team and rehab RN have worked together to provide customized collaborative inpatient rehab.  He has been afebrile during his stay and abdominal wound has been healing well.  P.o. intake has been good and is continent of bowel and bladder.  His pain control has been improving and oxycodone was being weaned down.  Headaches have been managed with as needed use of Tylenol.  His blood pressures were monitored on TID basis and and have been well controlled on lower dose Coreg.  His diabetes has been monitored with ac/hs CBG checks and SSI was use prn for tighter BS control.  His blood sugars are managed on diet alone.  Metformin as well as Ensure was discontinued due to issues with diarrhea.  He has now developed constipation and was started on senna daily to help manage this.    Insomnia has been managed with use of melatonin.  Serial  check of the neck showed that hypokalemia has resolved and hyponatremia stable around 134.  Serial CBC shows H&H to be relatively stable and  thrombocytosis has resolved.  He was maintained on cefotetan, Rocephin and Flagyl through 11/22.  Repeat CT abdomen pelvis done on 11/24 showing resolution of fluid collection.  Patient without any GI symptoms and PICC line was discontinued. General surgery has followed up on patient for evaluation of wound and drain.  Penrose drain was removed without difficulty on 12/01.  Wound care has been ongoing with use of calcium alginate dressing.  He has very small opening on superior aspect of wound with healthy granulation tissue and wet-to-dry dressing changes were recommended.  He has progressed to modified independent level and will continue receive follow-up home health PT by [facility] health after discharge.   Rehab course: During patient's stay in rehab weekly team conferences were held to monitor patient's progress, set goals and discuss barriers to discharge. At admission, patient required min assist with basic ADL tasks and with mobility. He  has had improvement in activity tolerance, balance, postural control as well as ability to compensate for deficits.  He is able to complete ADL tasks at modified independent level.  He is modified independent for transfers and to ambulate 300 feet with Rollator.   Disposition: Home  Diet: Carb modified.  Special Instructions: No driving or strenuous activity till cleared by MD. Pack open area of incision with damp to dry dressing and change  daily.  Allergies as of 04/09/2021       Reactions   Morphine Itching   Pravastatin Sodium Other (See Comments)   Severe muscle cramps with one time requiring admission.         Medication List     STOP taking these medications    allopurinol 100 MG tablet Commonly known as: ZYLOPRIM   aspirin EC 81 MG tablet   cefTRIAXone 2 g in sodium chloride 0.9 % 100 mL   feeding supplement Liqd   glipiZIDE 10 MG tablet Commonly known as: GLUCOTROL   Jardiance 10 MG Tabs tablet Generic drug:  empagliflozin   metroNIDAZOLE 500 MG tablet Commonly known as: FLAGYL   polyethylene glycol 17 g packet Commonly known as: MIRALAX / GLYCOLAX   rosuvastatin 5 MG tablet Commonly known as: CRESTOR   tamsulosin 0.4 MG Caps capsule Commonly known as: Flomax   traMADol 50 MG tablet Commonly known as: ULTRAM   Victoza 18 MG/3ML Sopn Generic drug: liraglutide       TAKE these medications    Accu-Chek Guide test strip Generic drug: glucose blood Check once a day   Accu-Chek Softclix Lancets lancets Check once a day   acetaminophen 325 MG tablet Commonly known as: TYLENOL Take 2 tablets (650 mg total) by mouth with breakfast, with lunch, and with evening meal. What changed:  when to take this reasons to take this Notes to patient: Can wean to as needed   albuterol 108 (90 Base) MCG/ACT inhaler Commonly known as: ProAir HFA Inhale 1-2 puffs into the lungs every 6 (six) hours as needed for wheezing or shortness of breath.   carvedilol 3.125 MG tablet Commonly known as: COREG Take 1 tablet (3.125 mg total) by mouth 2 (two) times daily before a meal. What changed:  medication strength how much to take   colchicine 0.6 MG tablet Take 1 tablet (0.6 mg total) by mouth daily.   diclofenac Sodium 1 % Gel Commonly known as: VOLTAREN Apply 2 g topically 4 (four) times daily.   gabapentin 100 MG capsule Commonly known as: NEURONTIN Take 2 capsules (200 mg total) by mouth 2 (two) times daily. What changed: when to take this   insulin glargine 100 UNIT/ML Solostar Pen Commonly known as: LANTUS Inject 15 Units into the skin daily.   Insulin Pen Needle 32G X 4 MM Misc Use to inject victoza one time a day   lidocaine 5 % Commonly known as: LIDODERM Place 1 patch onto the skin daily. On for 12 hours and off for 12 hours daily--can change to use as needed. Notes to patient: Apply to abdominal wall at 8 am and remove at 8 pm--for pain management. Available over the  counter   lip balm ointment Apply topically as needed for lip care.   melatonin 3 MG Tabs tablet Take 2 tablets (6 mg total) by mouth at bedtime.   multivitamin with minerals Tabs tablet Take 1 tablet by mouth daily.   nitroGLYCERIN 0.4 MG SL tablet Commonly known as: NITROSTAT DISSOLVE 1 TABLET UNDER THE TONGUE EVERY 5 MINUTES AS  NEEDED FOR CHEST PAIN , MAX 3 TABLETS IN 15 MINUTES,  CALL 911 IF NO RELIEF. What changed:  how much to take how to take this when to take this reasons to take this additional instructions   pantoprazole 40 MG tablet Commonly known as: PROTONIX Take 1 tablet (40 mg total) by mouth 2 (two) times daily.   senna-docusate 8.6-50 MG tablet Commonly known as:  Senokot-S Take 2 tablets by mouth at bedtime.        Follow-up Information     Raulkar, Clide Deutscher, MD Follow up.   Specialty: Physical Medicine and Rehabilitation Why: 07/06/21 please arrive at 1:20pm for 1:40pm appointment, thank you! Contact information: [id number] [place] [address] [phone]         Roselee Nova, MD. Call today.   Specialty: Family Medicine Why: for post hospital follow up Contact information: [place] [address] [phone]         Jettie Booze, MD .   Specialties: Cardiology, Radiology, Interventional Cardiology Contact information: [id number] [place] [address] [phone]         [facility], [facility] Follow up on 04/27/2021.   Specialty: General Surgery Why: 11am. Please arrive 30 minutes prior to your appointment. Please bring a copy of your photo ID and insurance card. Contact information: [place]  [id number] [phone]                 Signed: Bary Leriche 04/12/2021, 2:12 AM

## 2021-04-09 LAB — CBC WITH DIFFERENTIAL/PLATELET
Abs Immature Granulocytes: 0.01 10*3/uL (ref 0.00–0.07)
Basophils Absolute: 0 10*3/uL (ref 0.0–0.1)
Basophils Relative: 1 %
Eosinophils Absolute: 0.3 10*3/uL (ref 0.0–0.5)
Eosinophils Relative: 8 %
HCT: 36.9 % — ABNORMAL LOW (ref 39.0–52.0)
Hemoglobin: 11.7 g/dL — ABNORMAL LOW (ref 13.0–17.0)
Immature Granulocytes: 0 %
Lymphocytes Relative: 38 %
Lymphs Abs: 1.6 10*3/uL (ref 0.7–4.0)
MCH: 22 pg — ABNORMAL LOW (ref 26.0–34.0)
MCHC: 31.7 g/dL (ref 30.0–36.0)
MCV: 69.4 fL — ABNORMAL LOW (ref 80.0–100.0)
Monocytes Absolute: 0.7 10*3/uL (ref 0.1–1.0)
Monocytes Relative: 17 %
Neutro Abs: 1.5 10*3/uL — ABNORMAL LOW (ref 1.7–7.7)
Neutrophils Relative %: 36 %
Platelets: 263 10*3/uL (ref 150–400)
RBC: 5.32 MIL/uL (ref 4.22–5.81)
RDW: 15.3 % (ref 11.5–15.5)
WBC: 4.3 10*3/uL (ref 4.0–10.5)
nRBC: 0 % (ref 0.0–0.2)

## 2021-04-09 LAB — GLUCOSE, CAPILLARY: Glucose-Capillary: 116 mg/dL — ABNORMAL HIGH (ref 70–99)

## 2021-04-09 MED ORDER — MELATONIN 3 MG PO TABS: 6.0000 mg | ORAL_TABLET | Freq: Every day | ORAL | 0 refills | Status: DC

## 2021-04-09 MED ORDER — ACETAMINOPHEN 325 MG PO TABS: 650.0000 mg | ORAL_TABLET | Freq: Three times a day (TID) | ORAL | Status: DC

## 2021-04-09 MED ORDER — AUM MINI INSULIN PEN NEEDLE 33G X 5 MM MISC: 1.0000 "application " | Freq: Every day | 0 refills | Status: DC

## 2021-04-09 MED ORDER — LIDOCAINE 5 % EX PTCH: 1.0000 | MEDICATED_PATCH | CUTANEOUS | 0 refills | Status: DC

## 2021-04-09 MED ORDER — COLCHICINE 0.6 MG PO TABS: 0.6000 mg | ORAL_TABLET | Freq: Every day | ORAL | 0 refills | Status: DC

## 2021-04-09 MED ORDER — DICLOFENAC SODIUM 1 % EX GEL: 2.0000 g | Freq: Four times a day (QID) | CUTANEOUS | 0 refills | Status: AC

## 2021-04-09 MED ORDER — SENNOSIDES-DOCUSATE SODIUM 8.6-50 MG PO TABS: 2.0000 | ORAL_TABLET | Freq: Every day | ORAL | 0 refills | Status: DC

## 2021-04-09 MED ORDER — GABAPENTIN 100 MG PO CAPS: 200.0000 mg | ORAL_CAPSULE | Freq: Two times a day (BID) | ORAL | 0 refills | Status: DC

## 2021-04-09 MED ORDER — INSULIN GLARGINE 100 UNIT/ML SOLOSTAR PEN: 15.0000 [IU] | PEN_INJECTOR | Freq: Every day | SUBCUTANEOUS | 11 refills | Status: AC

## 2021-04-09 MED ORDER — PANTOPRAZOLE SODIUM 40 MG PO TBEC
40.0000 mg | DELAYED_RELEASE_TABLET | Freq: Two times a day (BID) | ORAL | 0 refills | Status: DC
Start: 2021-04-09 — End: 2021-05-12

## 2021-04-09 MED ORDER — CARVEDILOL 3.125 MG PO TABS
3.1250 mg | ORAL_TABLET | Freq: Two times a day (BID) | ORAL | 0 refills | Status: DC
Start: 2021-04-09 — End: 2021-05-12

## 2021-04-09 MED ORDER — LIP MEDEX EX OINT: TOPICAL_OINTMENT | CUTANEOUS | 0 refills | Status: DC | PRN

## 2021-04-09 NOTE — Discharge Summary (Incomplete)
Physician Discharge Summary  Patient ID: Daniel Perkins MRN: [id number] DOB/AGE: Sep 16, 1955 65 y.o.  Admit date: 03/25/2021 Discharge date: 04/09/2021  Discharge Diagnoses:  Principal Problem:   Debility Active Problems:   SBO (small bowel obstruction) (HCC)   Hyperkalemia   Essential hypertension   Diabetes mellitus type 2 in nonobese [facility])   Discharged Condition: {condition:18240}  Significant Diagnostic Studies: DG Abd 1 View  Result Date: 03/21/2021 CLINICAL DATA:  Abdominal pain, constipation EXAM: ABDOMEN - 1 VIEW COMPARISON:  CT 03/19/2021 FINDINGS: Nonobstructive bowel gas pattern. There is a moderate to large colonic stool burden. No radiopaque calculi over the kidneys or course of the ureters. No acute osseous abnormality. Multilevel degenerative changes of the spine. Bilateral hip degenerative changes, right worse than left. IMPRESSION: No evidence of bowel obstruction. Moderate to large colonic stool burden. Electronically Signed   By: Caprice Renshaw M.D.   On: 03/21/2021 12:08   DG Abd 1 View  Result Date: 03/10/2021 CLINICAL DATA:  Feeding tube placement EXAM: ABDOMEN - 1 VIEW COMPARISON:  03/05/2021 FINDINGS: Large-bore esophagogastric tube is positioned with tip below the diaphragm, in the gastric fundus, and side port near the level of the gastroesophageal junction. Recommend advancement to ensure subdiaphragmatic positioning. There is no enteric feeding tube identified in the lower chest or abdomen per reported exam indication. Gas-filled, nondistended loops of small bowel and colon in the included upper abdomen. IMPRESSION: 1. Large-bore esophagogastric tube is positioned with tip below the diaphragm, in the gastric fundus, and side port near the level of the gastroesophageal junction. Recommend advancement to ensure subdiaphragmatic positioning of tip and side port. 2. No enteric feeding tube identified in the lower chest or abdomen per reported exam indication.  Electronically Signed   By: Jearld Lesch M.D.   On: 03/10/2021 12:20   CT ABDOMEN PELVIS W CONTRAST  Result Date: 04/01/2021 CLINICAL DATA:  Lower abdominal pain EXAM: CT ABDOMEN AND PELVIS WITH CONTRAST TECHNIQUE: Multidetector CT imaging of the abdomen and pelvis was performed using the standard protocol following bolus administration of intravenous contrast. CONTRAST:  OMNIPAQUE IOHEXOL 300 MG/ML  SOLN COMPARISON:  CT abdomen and pelvis 03/19/2021 FINDINGS: Lower chest: No acute process identified. Breathing motion and chronic changes of the lungs. Hepatobiliary: Liver is normal in size and contour with no suspicious mass identified. Small amount of increased density material in the dependent gallbladder may represent stones/sludge. No biliary ductal dilatation. Pancreas: Unremarkable. No pancreatic ductal dilatation or surrounding inflammatory changes. Spleen: Normal in size without focal abnormality. Adrenals/Urinary Tract: Adrenal glands are unremarkable. Kidneys are normal, without renal calculi, focal lesion, or hydronephrosis. Bladder is unremarkable. Stomach/Bowel: Small hiatal hernia. No bowel obstruction, free air or pneumatosis. Colonic diverticulosis. Large amount of retained fecal material throughout the colon. Appendix is normal. Vascular/Lymphatic: Aortic atherosclerosis. No enlarged abdominal or pelvic lymph nodes. Reproductive: Prostate gland is mildly enlarged. Other: Interval resolution of the previous fluid collection anterior to the rectum and interval resolution and surgical changes of the previous fluid collection in the anterior subcutaneous tissues along the surgical incision. No ascites. Musculoskeletal: No suspicious bony lesions identified. IMPRESSION: 1. Interval resolution of previous fluid collections anterior to the rectum and in the anterior midline soft tissues along the surgical incision. 2. Colonic diverticulosis. 3. Small hiatal hernia. 4. Other chronic findings as  described. Electronically Signed   By: Jannifer Hick M.D.   On: 04/01/2021 13:08   CT ABDOMEN PELVIS W CONTRAST  Result Date: 03/19/2021 CLINICAL DATA:  Periumbilical pain, recent  surgery for small bowel obstruction EXAM: CT ABDOMEN AND PELVIS WITH CONTRAST TECHNIQUE: Multidetector CT imaging of the abdomen and pelvis was performed using the standard protocol following bolus administration of intravenous contrast. CONTRAST:  OMNIPAQUE IOHEXOL 300 MG/ML  SOLN COMPARISON:  Previous studies including the examination of 03/10/2021 FINDINGS: Lower chest: There are subpleural blebs and bullae in the lower lung fields. There are linear densities with air bronchogram in right middle lobe and both lower lobes with slight improvement. Hepatobiliary: No focal abnormality is seen in the the liver. Gallbladder is unremarkable. There is no dilation of bile ducts. Pancreas: No focal abnormality is seen. Spleen: Unremarkable. Adrenals/Urinary Tract: Adrenals are not enlarged. There is no hydronephrosis. There are no renal or ureteral stones. Urinary bladder is unremarkable. Stomach/Bowel: Stomach is not distended. There is no significant small bowel dilation. Appendix is not dilated. There is no significant wall thickening in colon. Scattered diverticula are seen in colon without signs of focal diverticulitis. There is 2.8 x 2.4 cm loculated fluid collection anterior to the rectum slightly to the right of midline in the pelvis. There is interval decrease in size since 03/10/2021. Vascular/Lymphatic: Arterial calcifications are seen. No new significant lymphadenopathy seen. Reproductive: Unremarkable. Other: There is no ascites or pneumoperitoneum. Skin staples are seen in the anterior abdominal wall. There is small ventral hernia containing fat in the epigastrium. There is interval appearance of 2.4 cm smooth marginated structure in the subcutaneous plane with fat fluid level. There is another loculated fluid  collection measuring 3.3 cm in diameter in the anterior abdominal wall immediately superior to the umbilicus. There are no pockets of air within these fluid collections. There are foci of subcutaneous stranding in the anterior abdominal wall, possibly related to parenteral administration of medications. Musculoskeletal: Degenerative changes are noted in the lumbar spine with no significant interval change. IMPRESSION: There is interval decrease in size of small loculated fluid collection in the pelvis anterior to the rectum. This fluid collection measures 2.8 cm in maximum diameter in the current study. No new loculated intra-abdominal fluid collections are seen. There is no evidence of intestinal obstruction or pneumoperitoneum. There is no hydronephrosis. There is new 3.3 cm loculated fluid collection in the subcutaneous plane in the anterior abdominal wall immediately superior to the umbilicus and posterior to the skin staples. This may suggest seroma or an abscess. There are no pockets of air in this fluid collection. There is another smaller loculated fluid collection with fat fluid level in the epigastrium adjacent to a ventral hernia. Possibility of fat necrosis in this region is not excluded. There are patchy linear infiltrates in both lower lung fields suggesting subsegmental atelectasis with slight improvement. Electronically Signed   By: Ernie Avena M.D.   On: 03/19/2021 13:05   CT ABDOMEN PELVIS W CONTRAST  Result Date: 03/10/2021 CLINICAL DATA:  Abdominal distension with increased pain. Five days status post exploratory laparotomy and small bowel perforation. EXAM: CT ABDOMEN AND PELVIS WITH CONTRAST TECHNIQUE: Multidetector CT imaging of the abdomen and pelvis was performed using the standard protocol following bolus administration of intravenous contrast. CONTRAST:  OMNIPAQUE IOHEXOL 300 MG/ML  SOLN COMPARISON:  CT abdomen and pelvis 03/04/2021. FINDINGS: Lower chest: There are new  bands of atelectasis/airspace disease in the bilateral lower lobes. Mild emphysematous changes are present. Hepatobiliary: Hyperdensity in the gallbladder is new from prior, likely excreted contrast. No radiopaque gallstones or bile duct dilatation. No focal liver lesions are identified. Pancreas: Unremarkable. No pancreatic ductal dilatation or  surrounding inflammatory changes. Spleen: Normal in size without focal abnormality. Adrenals/Urinary Tract: Adrenal glands are unremarkable. Kidneys are normal, without renal calculi, focal lesion, or hydronephrosis. Bladder is unremarkable. Stomach/Bowel: There is a moderate length segment of small bowel in the right abdomen demonstrating circumferential wall thickening/edema with mesenteric edema. There is no pneumatosis. Small bowel loops proximal to this level are dilated measuring up to 3.8 cm. Small bowel loops distal to this level are nondilated. Oral contrast reaches the distal ileum. Colon and appendix are within normal limits. Nasogastric tube tip is in the body of the stomach. The stomach is otherwise within normal limits. There is no free air. Vascular/Lymphatic: Aortic atherosclerosis. No enlarged abdominal or pelvic lymph nodes. Reproductive: Prostate is unremarkable. Other: There is a new peripherally enhancing fluid collection within the pelvis anterior to the rectum measuring 3.0 x 3.1 x 4.2 cm. There is no focal abdominal wall hernia. Midline skin staples are present. Injection sites noted in the anterior abdominal wall. Musculoskeletal: Stable mixed lucencies with coarsened trabeculae in the T12 and L1 vertebral bodies. IMPRESSION: 1. Moderate length segment small bowel in the right abdomen demonstrating wall thickening and mesenteric edema worrisome for nonspecific enteritis including infectious, inflammatory and ischemic etiologies. Small bowel loops proximal to this level are dilated, but oral contrast is seen distal to this level. Findings are  compatible with partial small bowel obstruction. 2. New enhancing fluid collection in the pelvis may represent abscess. 3. Nasogastric tube tip in the mid stomach. 4. New bilateral lower lobe atelectasis/airspace disease. 5. These results were called by telephone at the time of interpretation on 03/10/2021 at 8:33 pm to provider NP Chinita Greenland, who verbally acknowledged these results. Electronically Signed   By: Darliss Cheney M.D.   On: 03/10/2021 20:34   [place] EKG SITE RITE  Result Date: 03/11/2021 If Site Rite image not attached, placement could not be confirmed due to current cardiac rhythm.   Labs:  Basic Metabolic Panel: Recent Labs  Lab 04/05/21 0707  NA 134*  K 4.3  CL 102  CO2 25  GLUCOSE 120*  BUN 14  CREATININE 1.01  CALCIUM 9.2    CBC: Recent Labs  Lab 04/09/21 0559  WBC 4.3  NEUTROABS 1.5*  HGB 11.7*  HCT 36.9*  MCV 69.4*  PLT 263    CBG: Recent Labs  Lab 04/08/21 0604 04/08/21 1124 04/08/21 1608 04/08/21 2212 04/09/21 0553  GLUCAP 115* 109* 127* 133* 116*    Brief HPI:   Jerol Rufener is a 65 y.o. male ***   Hospital Course: Lovie Agresta was admitted to rehab 03/25/2021 for inpatient therapies to consist of PT, ST and OT at least three hours five days a week. Past admission physiatrist, therapy team and rehab RN have worked together to provide customized collaborative inpatient rehab.   Blood pressures were monitored on TID basis and   Diabetes has been monitored with ac/hs CBG checks and SSI was use prn for tighter BS control.    Rehab course: During patient's stay in rehab weekly team conferences were held to monitor patient's progress, set goals and discuss barriers to discharge. At admission, patient required  He/She  has had improvement in activity tolerance, balance, postural control as well as ability to compensate for deficits. He/She has had improvement in functional use RUE/LUE  and RLE/LLE as well as improvement in  awareness       Disposition:  There are no questions and answers to display.  Diet:  Special Instructions:   Allergies as of 04/09/2021       Reactions   Morphine Itching   Pravastatin Sodium Other (See Comments)   Severe muscle cramps with one time requiring admission.         Medication List     STOP taking these medications    allopurinol 100 MG tablet Commonly known as: ZYLOPRIM   aspirin EC 81 MG tablet   cefTRIAXone 2 g in sodium chloride 0.9 % 100 mL   feeding supplement Liqd   glipiZIDE 10 MG tablet Commonly known as: GLUCOTROL   Jardiance 10 MG Tabs tablet Generic drug: empagliflozin   metroNIDAZOLE 500 MG tablet Commonly known as: FLAGYL   polyethylene glycol 17 g packet Commonly known as: MIRALAX / GLYCOLAX   rosuvastatin 5 MG tablet Commonly known as: CRESTOR   tamsulosin 0.4 MG Caps capsule Commonly known as: Flomax   traMADol 50 MG tablet Commonly known as: ULTRAM   Victoza 18 MG/3ML Sopn Generic drug: liraglutide       TAKE these medications    Accu-Chek Guide test strip Generic drug: glucose blood Check once a day   Accu-Chek Softclix Lancets lancets Check once a day   acetaminophen 325 MG tablet Commonly known as: TYLENOL Take 2 tablets (650 mg total) by mouth with breakfast, with lunch, and with evening meal. What changed:  when to take this reasons to take this Notes to patient: Can wean to as needed   albuterol 108 (90 Base) MCG/ACT inhaler Commonly known as: ProAir HFA Inhale 1-2 puffs into the lungs every 6 (six) hours as needed for wheezing or shortness of breath.   carvedilol 3.125 MG tablet Commonly known as: COREG Take 1 tablet (3.125 mg total) by mouth 2 (two) times daily before a meal. What changed:  medication strength how much to take   colchicine 0.6 MG tablet Take 1 tablet (0.6 mg total) by mouth daily.   diclofenac Sodium 1 % Gel Commonly known as: VOLTAREN Apply 2 g  topically 4 (four) times daily.   gabapentin 100 MG capsule Commonly known as: NEURONTIN Take 2 capsules (200 mg total) by mouth 2 (two) times daily. What changed: when to take this   insulin glargine 100 UNIT/ML Solostar Pen Commonly known as: LANTUS Inject 15 Units into the skin daily.   Insulin Pen Needle 32G X 4 MM Misc Use to inject victoza one time a day   lidocaine 5 % Commonly known as: LIDODERM Place 1 patch onto the skin daily. On for 12 hours and off for 12 hours daily--can change to use as needed. Notes to patient: Apply to abdominal wall at 8 am and remove at 8 pm--for pain management. Available over the counter   lip balm ointment Apply topically as needed for lip care.   melatonin 3 MG Tabs tablet Take 2 tablets (6 mg total) by mouth at bedtime.   multivitamin with minerals Tabs tablet Take 1 tablet by mouth daily.   nitroGLYCERIN 0.4 MG SL tablet Commonly known as: NITROSTAT DISSOLVE 1 TABLET UNDER THE TONGUE EVERY 5 MINUTES AS  NEEDED FOR CHEST PAIN , MAX 3 TABLETS IN 15 MINUTES,  CALL 911 IF NO RELIEF. What changed:  how much to take how to take this when to take this reasons to take this additional instructions   pantoprazole 40 MG tablet Commonly known as: PROTONIX Take 1 tablet (40 mg total) by mouth 2 (two) times daily.   senna-docusate 8.6-50 MG tablet  Commonly known as: Senokot-S Take 2 tablets by mouth at bedtime.        Follow-up Information     Raulkar, Drema Pry, MD Follow up.   Specialty: Physical Medicine and Rehabilitation Why: 07/06/21 please arrive at 1:20pm for 1:40pm appointment, thank you! Contact information: [address] [place] [address] [phone]         Ellyn Hack, MD. Call today.   Specialty: Family Medicine Why: for post hospital follow up Contact information: [address] [place] [address] [phone]         Corky Crafts, MD .   Specialties: Cardiology,  Radiology, Interventional Cardiology Contact information: [address] [place] [address] [phone]         [facility], [place] Follow up on 04/27/2021.   Specialty: General Surgery Why: 11am. Please arrive 30 minutes prior to your appointment. Please bring a copy of your photo ID and insurance card. Contact information: [address] [place] [address] [phone]                 Signed: Jacquelynn Cree 04/09/2021, 11:05 AM

## 2021-04-09 NOTE — Progress Notes (Signed)
PROGRESS NOTE   Subjective/Complaints: Ready for d/c home today Appreciate pharmacy discharge medication review.  Discussed discharge plan with patient and follow-up   ROS:  Pt denies SOB, abd pain, CP, N/V/C/, and vision changes  Objective:   No results found. Recent Labs    04/09/21 0559  WBC 4.3  HGB 11.7*  HCT 36.9*  PLT 263    No results for input(s): NA, K, CL, CO2, GLUCOSE, BUN, CREATININE, CALCIUM in the last 72 hours.    Intake/Output Summary (Last 24 hours) at 04/09/2021 1014 Last data filed at 04/09/2021 0700 Gross per 24 hour  Intake 402 ml  Output 750 ml  Net -348 ml        Physical Exam: Vital Signs Blood pressure 112/74, pulse 82, temperature 98.2 F (36.8 C), resp. rate 14, height 5\' 7"  (1.702 m), weight 90 kg, SpO2 99 %.  Gen: no distress, normal appearing HEENT: oral mucosa pink and moist, NCAT Cardio: Reg rate Chest: normal effort, normal rate of breathing    Abdomen:   Extremities: No clubbing, cyanosis, or edema  Skin: Warm and dry.  Abdominal wound with central area packed, 1cm packed area, no drainage or surrounding erythema /induration  Musc: No edema in extremities.  No tenderness in extremities. No pain in his right temporal area with palpation Neuro: Alert . No cn findings.  Motor: Grossly 4+-5/5 throughout. No focal sensory  Assessment/Plan: 1. Functional deficits which require 3+ hours per day of interdisciplinary therapy in a comprehensive inpatient rehab setting. Physiatrist is providing close team supervision and 24 hour management of active medical problems listed below. Physiatrist and rehab team continue to assess barriers to discharge/monitor patient progress toward functional and medical goals  Care Tool:  Bathing    Body parts bathed by patient: Right arm, Left arm, Chest, Abdomen, Front perineal area, Buttocks, Right upper leg, Left upper leg, Right lower  leg, Left lower leg, Face   Body parts bathed by helper: Front perineal area, Buttocks     Bathing assist Assist Level: Independent with assistive device     Upper Body Dressing/Undressing Upper body dressing   What is the patient wearing?: Pull over shirt    Upper body assist Assist Level: Independent with assistive device    Lower Body Dressing/Undressing Lower body dressing      What is the patient wearing?: Pants     Lower body assist Assist for lower body dressing: Independent with assitive device     Toileting Toileting    Toileting assist Assist for toileting: Independent with assistive device     Transfers Chair/bed transfer  Transfers assist     Chair/bed transfer assist level: Independent with assistive device Chair/bed transfer assistive device: Other (rollator)   Locomotion Ambulation   Ambulation assist      Assist level: Independent with assistive device Assistive device: Rollator Max distance: >356ft   Walk 10 feet activity   Assist     Assist level: Independent with assistive device Assistive device: Rollator   Walk 50 feet activity   Assist    Assist level: Independent with assistive device Assistive device: Rollator    Walk 150 feet activity  Assist Walk 150 feet activity did not occur: Safety/medical concerns (fatigue, weakness, decreased balance)  Assist level: Independent with assistive device Assistive device: Rollator    Walk 10 feet on uneven surface  activity   Assist Walk 10 feet on uneven surfaces activity did not occur: Safety/medical concerns (fatigue, weakness, decreased balance)   Assist level: Independent with assistive device Assistive device: Rollator   Wheelchair     Assist Is the patient using a wheelchair?: No Type of Wheelchair: Manual Wheelchair activity did not occur: N/A  Wheelchair assist level: Supervision/Verbal cueing Max wheelchair distance: >150'    Wheelchair 50 feet  with 2 turns activity    Assist    Wheelchair 50 feet with 2 turns activity did not occur: N/A   Assist Level: Supervision/Verbal cueing   Wheelchair 150 feet activity     Assist  Wheelchair 150 feet activity did not occur: N/A   Assist Level: Supervision/Verbal cueing   Blood pressure 112/74, pulse 82, temperature 98.2 F (36.8 C), resp. rate 14, height 5\' 7"  (1.702 m), weight 90 kg, SpO2 99 %.  Medical Problem List and Plan: 1.  Debility secondary to SBO with bowel perforation s/p NGT and TPN and persistent ileus and constipation on IV ABX for pelvic abscess  D/c home today 2.  Impaired mobility: ambulating well: d/c Lovenox             -antiplatelet therapy: N/A 3. Headaches/Chronic neck pain/Pain Management: Takes gabapentin prn D/c oxycodone             -11/23 will schedule tylenol for headaches. Appear to be tension type.  11/28- denies pain- con't regimen 4. Mood: LCSW to follow for evaluation and support.              -antipsychotic agents: N/a 5. Neuropsych: This patient is capable of making decisions on his own behalf. 6. Skin/Wound Care: Routine  pressure  relief measures 7. Fluids/Electrolytes/Nutrition: Monitor I/Os 8. SBO with ileus s/p lysis of adhesions: Continue wound care bid with bid wet to dry dressing changes - moistened plain guaze packing strips or 2 inch kerlix           Continue calcium alginate dressing 9. Abdominal wall seroma: Penrose drain placed 11/17 and to be removed in 2 weeks in clinic. 10. Wound infection: PICC d/ced and antibiotics completed 11. HTN: well controlled, decrease Coreg to 3.125mg  BID.  12. T2DM: Hgb a1c- 6.0 and well controlled. Will monitor BS ac/hs and use SSI for elevated BS. Continue glargine insulin. --Used victoza 1.8, glucotrol and Jardiance. Metformin d/ced due to diarrhea.  D/c Ensure given hyperglycemia 13. Gout: Continue colchicine.  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  15. Hyperkalemia: 1 dose kayexalate 11/18, 3.9 on 11.21, monitor as needed.   1128- labs pending.  16. Hyponatremia: Na 134, monitor as needed.  17. Anemia: d/c Lovenox above given improved ambulation, monitor Hgb as needed.  18. Diarrhea: d/c metformin and Ensure 19. Disposition: HFU scheduled.   >30 minutes spent in discharge of patient including review of medications and follow-up appointments, physical examination, and in answering all patient's questions    LOS: 15 days A FACE TO FACE EVALUATION WAS PERFORMED  Shevonne Wolf P Lolitha Tortora 04/09/2021, 10:14 AM

## 2021-04-09 NOTE — Progress Notes (Signed)
Inpatient Rehabilitation Discharge Medication Review by a Pharmacist  A complete drug regimen review was completed for this patient to identify any potential clinically significant medication issues.  High Risk Drug Classes Is patient taking? Indication by Medication  Antipsychotic No   Anticoagulant No   Antibiotic No   Opioid Yes OxyIR- post op pain  Antiplatelet No   Hypoglycemics/insulin Yes Semglee- T2DM  Vasoactive Medication Yes Coreg- HTN  Chemotherapy No   Other Yes Gabapentin- neuropathic pain Colchicine- gout prevention     Type of Medication Issue Identified Description of Issue Recommendation(s)  Drug Interaction(s) (clinically significant)     Duplicate Therapy     Allergy     No Medication Administration End Date     Incorrect Dose     Additional Drug Therapy Needed     Significant med changes from prior encounter (inform family/care partners about these prior to discharge).    Other  PTA medications- Norvasc, Diovan, Glipizide, Jardiance, Victoza, Flomax, aspirin Restart on discharge if clinically necessary    Clinically significant medication issues were identified that warrant physician communication and completion of prescribed/recommended actions by midnight of the next day:  No   Time spent performing this drug regimen review (minutes):  [address], PharmD, [place], AAHIVP, CPP Infectious Disease Pharmacist 04/09/2021 8:56 AM

## 2021-04-12 ENCOUNTER — Telehealth: Payer: Self-pay

## 2021-04-12 DIAGNOSIS — Z8719 Personal history of other diseases of the digestive system: Secondary | ICD-10-CM

## 2021-04-12 DIAGNOSIS — T8131XA Disruption of external operation (surgical) wound, not elsewhere classified, initial encounter: Secondary | ICD-10-CM

## 2021-04-12 NOTE — Telephone Encounter (Signed)
Monique, PT called to get PT POC verbal orders for 2 times a week for 4 weeks and 1 time a week for 2 weeks. Left detailed voicemail giving verbal orders for patient.

## 2021-04-12 NOTE — Telephone Encounter (Signed)
Tobi Bastos called stating the patient is requesting skilled nursing to come to the home to make sure he is doing everything properly with the wound and for medication management. Returned her call to give verbal orders for skilled nursing.

## 2021-04-14 ENCOUNTER — Telehealth: Payer: Self-pay | Admitting: Interventional Cardiology

## 2021-04-14 ENCOUNTER — Telehealth: Payer: Self-pay

## 2021-04-14 NOTE — Telephone Encounter (Signed)
Approved through 05/08/2022. °

## 2021-04-14 NOTE — Telephone Encounter (Signed)
A user error has taken place: encounter opened in error, closed for administrative reasons.

## 2021-04-14 NOTE — Telephone Encounter (Signed)
PA submitted for Lidocaine patches 

## 2021-04-15 ENCOUNTER — Telehealth: Payer: Self-pay | Admitting: *Deleted

## 2021-04-15 NOTE — Telephone Encounter (Signed)
[facility] called to request OT POC 1wk6.  Approval given.

## 2021-04-29 ENCOUNTER — Telehealth: Payer: Self-pay

## 2021-04-29 ENCOUNTER — Telehealth: Payer: Self-pay | Admitting: Interventional Cardiology

## 2021-04-29 NOTE — Telephone Encounter (Signed)
I spoke with Anissa.  She saw patient for the first time today and reports BP readings below.  These were taken in the last 7 days by patient at home. Usually checks once per day. When Anissa checked BP today it was 180/89. Prior to last 7 days readings were 145/86, 147/76, 125/76.  She reports patient has been eating a lot of salty foods and she discussed need to limit salt intake with him. Anissa reports patient is taking Coreg, Valsartan and Amlodipine.  These were prescribed by patient's PCP (Dr Sherryll Burger).  I told Anissa we did not have Valsartan and Amlodipine on current med list and that patient had not been seen here since recent hospitalizations.  She reports amlodipine was started on 12/19.  I asked Anissa to contact PCP as it sounds like they have made recent changes in patient's BP medications.  I told her she could call our office back if PCP office was unable to assist.

## 2021-04-29 NOTE — Telephone Encounter (Signed)
Daughter Daniel Perkins has been update on patients blood pressure reading  and advised. (Patient phone number updated).

## 2021-04-29 NOTE — Telephone Encounter (Signed)
Anissa from [facility] call to report patient BP  Pt c/o BP issue: STAT if pt c/o blurred vision, one-sided weakness or slurred speech  1. What are your last 5 BP readings?  184/94 206/110 189/106 164/101 193/118 186/93  2. Are you having any other symptoms (ex. Dizziness, headache, blurred vision, passed out)? no  3. What is your BP issue? She called to say his BP is very high for the past week, and he needs to be seen prior to his February appt.

## 2021-04-29 NOTE — Telephone Encounter (Signed)
CALLER INFORMED DR. Carlis Abbott IS NOT IN THE OFFICE:   The home health nurse Oni RN, with [facility] called: Mr. Feldhaus blood pressure reading today was 184/94. He has been monitoring the blood pressure at home with readings of 206/118 & 193/100.   Oni RN has placed a call to Cardiology & will call Dr. Sherryll Burger PCP for advice.I have advised caller if blood pressure continues to be increased seek help at an Urgent Care or ED. (Per nurse he is not have any complaints).   Call back phone [phone].

## 2021-05-11 ENCOUNTER — Other Ambulatory Visit: Payer: Self-pay | Admitting: Physical Medicine and Rehabilitation

## 2021-05-12 ENCOUNTER — Other Ambulatory Visit: Payer: Self-pay

## 2021-05-12 DIAGNOSIS — G4739 Other sleep apnea: Secondary | ICD-10-CM

## 2021-05-13 ENCOUNTER — Other Ambulatory Visit: Payer: Medicare Other

## 2021-05-13 MED ORDER — PANTOPRAZOLE SODIUM 40 MG PO TBEC: 40.0000 mg | DELAYED_RELEASE_TABLET | Freq: Two times a day (BID) | ORAL | 0 refills | Status: DC

## 2021-05-13 MED ORDER — ALBUTEROL SULFATE HFA 108 (90 BASE) MCG/ACT IN AERS: 1.0000 | INHALATION_SPRAY | Freq: Four times a day (QID) | RESPIRATORY_TRACT | 1 refills | Status: DC | PRN

## 2021-05-30 NOTE — Progress Notes (Signed)
Cardiology Office Note   Date:  05/31/2021   ID:  Daniel GessGarry Stanis, DOB 06/29/1955, MRN [id number]  PCP:  Ellyn HackShah, Syed Asad A, MD    No chief complaint on file.  Coronary artery calcification  Wt Readings from Last 3 Encounters:  05/31/21 194 lb 9.6 oz (88.3 kg)  04/08/21 198 lb 6.6 oz (90 kg)  03/22/21 196 lb 13.9 oz (89.3 kg)       History of Present Illness: Daniel Perkins is a 66 y.o. male  who has had prior carduac testing :   "Prior records show: "has history of minimal coronary plaque by previous cardiac catheterization, diabetes, hypertension and hyperlipidemia.  He quit smoking in 2010.  "   " CTA of the coronary arteries in May 2019  showed a calcium score of 5 with normal coronary arteries.  "   Echo in 2020 showed: "The left ventricle has normal systolic function with an ejection  fraction of 60-65%. The cavity size was normal. There is moderately  increased left ventricular wall thickness. Left ventricular diastolic  Doppler parameters are consistent with impaired   relaxation Indeterminent filling pressures The E/e' is 8-15.   2. The right ventricle has normal systolic function. The cavity was  normal. There is no increase in right ventricular wall thickness.   3. The mitral valve is degenerative. Mild thickening of the mitral valve  leaflet.   4. The tricuspid valve is normal in structure.   5. The aortic valve is tricuspid Mild calcification of the aortic valve.  Aortic valve regurgitation is trivial by color flow Doppler.   6. The aortic root and ascending aorta are normal in size and structure.   7. The interatrial septum was not well visualized.   8. When compared to the prior study: 08/11/10 EF 65-70%. "   2018 stress test showed: "Low risk Myoview. EF is normal at 61% and normal resting and stress perfusion."  2022 calcium scoring CT showed: "Coronary arteries: Normal origins.   Coronary Calcium Score:   Left main: 0   Left anterior  descending artery: 38.2   Left circumflex artery: 24.3   Right coronary artery: 5.29   Total: 67.9   Percentile: 72nd   Pericardium: Normal.   Ascending Aorta: Normal caliber. Scattered calcifications in the ascending and descending aorta.   [facility]cardiac: [facility].   IMPRESSION: Coronary calcium score of 67.9. This was 72nd percentile for age-, race-, and sex-matched controls."  He " was admitted on 03/04/2021 with 24-hour history of nausea vomiting and abdominal pain due to SBO.  NG tube was placed for decompression without good results and he was taken to the OR on 10/28 for diagnostic laparotomy with lysis of bed LLQ and main laparotomy with primary repair of 2 small bowel perforation by Dr. Doylene Canardonner.  Postop NG tube remain in place due to persistent ileus and he required TNA for nutritional support." Discharged from rehab in 04/2021.  HHPT still coming to his house.   His appetite is normal.  He feels he is eating well.  He had a BM yesterday.  He is to call his MD if he does not have a BM in a few days.  Taking Senna.   Readings at home havebeen in the 120/80 range.  Aspirin and rosuvastatin were stopped in the hospital.   Past Medical History:  Diagnosis Date   Club foot of both lower extremities 1958   Coronary artery disease  Holter monitor 02/2012 - sinus with rare PVC   CTEV (congenital talipes equinovarus)    Gout    Hyperlipidemia    Hypertension    Myocardial infarction [facility]) ~ 2007   "mild"   Osteomyelitis (HCC)    Pneumonia 2000's   "couple times"   Pneumonia due to COVID-19 virus 04/28/2019   Type II diabetes mellitus (HCC) 2011    Past Surgical History:  Procedure Laterality Date   ANTERIOR CERVICAL DECOMP/DISCECTOMY FUSION  X 2   CARDIAC CATHETERIZATION     FOOT FRACTURE SURGERY Right 1990's   "pt a steel plate in"   FOOT SURGERY Left x16   "joints collapsed; keep getting infected"   LAPAROSCOPY N/A  03/05/2021   Procedure: LAPAROSCOPY DIAGNOSTIC;  Surgeon: Berna Bueonnor, Chelsea A, MD;  Location: [facility] OR;  Service: General;  Laterality: N/A;   LAPAROTOMY Right 03/05/2021   Procedure: EXPLORATORY LAPAROTOMY, REPAIR SMALL BOWEL PERFORATION;  Surgeon: Berna Bueonnor, Chelsea A, MD;  Location: [facility] OR;  Service: General;  Laterality: Right;   LYSIS OF ADHESION  03/05/2021   Procedure: LYSIS OF ADHESION;  Surgeon: Berna Bueonnor, Chelsea A, MD;  Location: [facility] OR;  Service: General;;   POSTERIOR CERVICAL FUSION/FORAMINOTOMY  X 2     Current Outpatient Medications  Medication Sig Dispense Refill   Accu-Chek Softclix Lancets lancets Check once a day 100 each 12   acetaminophen (TYLENOL) 325 MG tablet Take 2 tablets (650 mg total) by mouth with breakfast, with lunch, and with evening meal.     albuterol (PROAIR HFA) 108 (90 Base) MCG/ACT inhaler Inhale 1-2 puffs into the lungs every 6 (six) hours as needed for wheezing or shortness of breath. 8 g 1   amLODipine (NORVASC) 2.5 MG tablet Take 2.5 mg by mouth daily.     amLODipine (NORVASC) 5 MG tablet Take 5 mg by mouth daily.     carvedilol (COREG) 3.125 MG tablet TAKE 1 TABLET BY MOUTH TWICE DAILY BEFORE A MEAL 60 tablet 0   colchicine 0.6 MG tablet Take 1 tablet (0.6 mg total) by mouth daily. 30 tablet 0   diclofenac Sodium (VOLTAREN) 1 % GEL Apply 2 g topically 4 (four) times daily. 350 g 0   gabapentin (NEURONTIN) 100 MG capsule Take 2 capsules (200 mg total) by mouth 2 (two) times daily. 120 capsule 0   glucose blood (ACCU-CHEK GUIDE) test strip Check once a day 100 each 12   insulin glargine (LANTUS) 100 UNIT/ML Solostar Pen Inject 15 Units into the skin daily. 15 mL 11   Insulin Pen Needle 32G X 4 MM MISC Use to inject victoza one time a day 100 each 3   lip balm (CARMEX) ointment Apply topically as needed for lip care. 7 g 0   Multiple Vitamin (MULTIVITAMIN WITH MINERALS) TABS tablet Take 1 tablet by mouth daily.     nitroGLYCERIN (NITROSTAT) 0.4 MG SL tablet  DISSOLVE 1 TABLET UNDER THE TONGUE EVERY 5 MINUTES AS  NEEDED FOR CHEST PAIN , MAX 3 TABLETS IN 15 MINUTES,  CALL 911 IF NO RELIEF. 25 tablet 1   pantoprazole (PROTONIX) 40 MG tablet Take 1 tablet (40 mg total) by mouth 2 (two) times daily. 60 tablet 0   senna-docusate (SENOKOT-S) 8.6-50 MG tablet Take 2 tablets by mouth at bedtime. 60 tablet 0   valsartan (DIOVAN) 160 MG tablet Take 160 mg by mouth daily.     lidocaine (LIDODERM) 5 % Place 1 patch onto the skin daily. On for 12 hours and off  for 12 hours daily--can change to use as needed. (Patient not taking: Reported on 05/31/2021) 30 patch 0   melatonin 3 MG TABS tablet Take 2 tablets (6 mg total) by mouth at bedtime. (Patient not taking: Reported on 05/31/2021) 60 tablet 0   No current facility-administered medications for this visit.    Allergies:   Morphine and Pravastatin sodium    Social History:  The patient  reports that he quit smoking about 13 years ago. His smoking use included cigarettes. He has a 32.00 pack-year smoking history. He has never used smokeless tobacco. He reports current alcohol use. He reports that he does not use drugs.   Family History:  The patient's family history includes Coronary artery disease in his mother; Diabetes in his mother; Heart attack in his brother; Heart disease in his mother.    ROS:  Please see the history of present illness.   Otherwise, review of systems are positive for .   All other systems are reviewed and negative.    PHYSICAL EXAM: VS:  BP 132/80    Pulse 92    Ht 5\' 7"  (1.702 m)    Wt 194 lb 9.6 oz (88.3 kg)    SpO2 96%    BMI 30.48 kg/m  , BMI Body mass index is 30.48 kg/m. GEN: Well nourished, well developed, in no acute distress HEENT: normal Neck: no JVD, carotid bruits, or masses Cardiac: RRR; no murmurs, rubs, or gallops,no edema  Respiratory:  clear to auscultation bilaterally, normal work of breathing GI: soft, nontender, nondistended, + BS MS: no deformity or  atrophy Skin: warm and dry, no rash Neuro:  Strength and sensation are intact Psych: euthymic mood, full affect   EKG:   The ekg ordered 12/2020 demonstrates NSR, LAD, PRWP   Recent Labs: 03/26/2021: ALT 38 03/29/2021: Magnesium 1.7 04/05/2021: BUN 14; Creatinine, Ser 1.01; Potassium 4.3; Sodium 134 04/09/2021: Hemoglobin 11.7; Platelets 263   Lipid Panel    Component Value Date/Time   CHOL 213 (H) 02/13/2020 1116   TRIG 118 03/15/2021 0337   HDL 39 (L) 02/13/2020 1116   CHOLHDL 5.5 (H) 02/13/2020 1116   CHOLHDL 6.1 06/30/2014 1634   VLDL 45 (H) 06/30/2014 1634   LDLCALC 136 (H) 02/13/2020 1116     Other studies Reviewed: Additional studies/ records that were reviewed today with results demonstrating: Hospital records reviewed.   ASSESSMENT AND PLAN:  Coronary artery calcification: No angina.  Healthy lifestyle.  Regular exercise, healthy diet, avoiding processed foods.  Off aspirin at this time.  We will check with surgeon if 81 mg of aspirin daily would be okay for him to take to lower his cardiac risk. Aortic atherosclerosis/PAD: Abnormal ABIs.  No claudication or nonhealing ulcers.  Hyperlipidemia: Restart Crestor 5 mg daily.  Recheck cholesterol and liver tests in 3 months.  We will check with Dr. 04/14/2020 if a whole food, high-fiber, plant-based diet would be reasonable given his GI issues. HTN: Blood pressures controlled on valsartan 160 mg daily, amlodipine 5 mg daily, and carvedilol 3.125 mg twice daily.  Low salt diet.  Continue to check blood pressures at home. DM: A1c 6.0 in October 2022.  Healthy diet with regular exercise will help keep the sugar down.  Check A1c in 3 months as well   Current medicines are reviewed at length with the patient today.  The patient concerns regarding his medicines were addressed.  The following changes have been made: Restart rosuvastatin 5 mg daily  Labs/ tests ordered  today include: Lipids and liver test along with A1c in 3  months No orders of the defined types were placed in this encounter.   Recommend 150 minutes/week of aerobic exercise Low fat, low carb, high fiber diet recommended  Disposition:   FU in 1 year   Signed, Lance Muss, MD  05/31/2021 3:01 PM    [facility] [address] [place], [place], [place]  [address] Phone: [phone]; Fax: [phone]

## 2021-05-31 ENCOUNTER — Other Ambulatory Visit: Payer: Self-pay

## 2021-05-31 ENCOUNTER — Encounter: Payer: Self-pay | Admitting: Interventional Cardiology

## 2021-05-31 ENCOUNTER — Ambulatory Visit (INDEPENDENT_AMBULATORY_CARE_PROVIDER_SITE_OTHER): Payer: Medicare Other | Admitting: Interventional Cardiology

## 2021-05-31 VITALS — BP 132/80 | HR 92 | Ht 67.0 in | Wt 194.6 lb

## 2021-05-31 DIAGNOSIS — I739 Peripheral vascular disease, unspecified: Secondary | ICD-10-CM | POA: Diagnosis not present

## 2021-05-31 DIAGNOSIS — I2584 Coronary atherosclerosis due to calcified coronary lesion: Secondary | ICD-10-CM

## 2021-05-31 DIAGNOSIS — I10 Essential (primary) hypertension: Secondary | ICD-10-CM | POA: Diagnosis not present

## 2021-05-31 DIAGNOSIS — E1159 Type 2 diabetes mellitus with other circulatory complications: Secondary | ICD-10-CM | POA: Diagnosis not present

## 2021-05-31 DIAGNOSIS — I2510 Atherosclerotic heart disease of native coronary artery without angina pectoris: Secondary | ICD-10-CM

## 2021-05-31 DIAGNOSIS — E782 Mixed hyperlipidemia: Secondary | ICD-10-CM

## 2021-05-31 MED ORDER — ROSUVASTATIN CALCIUM 5 MG PO TABS: 5.0000 mg | ORAL_TABLET | Freq: Every day | ORAL | 3 refills | Status: DC

## 2021-05-31 NOTE — Patient Instructions (Signed)
Medication Instructions:  Your physician has recommended you make the following change in your medication: Resume Rosuvastatin 5 mg by mouth daily  *If you need a refill on your cardiac medications before your next appointment, please call your pharmacy*   Lab Work: Your physician recommends that you return for lab work on August 26, 2021.  CMET, Lipids and A1C.  This will be fasting.  The lab opens at 7:30 AM  If you have labs (blood work) drawn today and your tests are completely normal, you will receive your results only by: [place] (if you have MyChart) OR A paper copy in the mail If you have any lab test that is abnormal or we need to change your treatment, we will call you to review the results.   Testing/Procedures: none   Follow-Up: At [facility], you and your health needs are our priority.  As part of our continuing mission to provide you with exceptional heart care, we have created designated Provider Care Teams.  These Care Teams include your primary Cardiologist (physician) and Advanced Practice Providers (APPs -  Physician Assistants and Nurse Practitioners) who all work together to provide you with the care you need, when you need it.  We recommend signing up for the patient portal called "MyChart".  Sign up information is provided on this After Visit Summary.  MyChart is used to connect with patients for Virtual Visits (Telemedicine).  Patients are able to view lab/test results, encounter notes, upcoming appointments, etc.  Non-urgent messages can be sent to your provider as well.   To learn more about what you can do with MyChart, go to [email or web address].    Your next appointment:   12 month(s)  The format for your next appointment:   In Person  Provider:   Larae Grooms, MD     Other Instructions

## 2021-06-01 ENCOUNTER — Telehealth: Payer: Self-pay | Admitting: *Deleted

## 2021-06-01 MED ORDER — ASPIRIN EC 81 MG PO TBEC: 81.0000 mg | DELAYED_RELEASE_TABLET | Freq: Every day | ORAL | 3 refills | Status: DC

## 2021-06-01 NOTE — Telephone Encounter (Signed)
Corky Crafts, MD  Dossie Arbour, RN Surgeon ok with aspirin 81 mg daily and high fiber, plant based diet.   JV   Patient notified. He will start enteric coated aspirin 81 mg daily.

## 2021-06-03 ENCOUNTER — Other Ambulatory Visit: Payer: Self-pay | Admitting: Family Medicine

## 2021-06-03 ENCOUNTER — Ambulatory Visit
Admission: RE | Admit: 2021-06-03 | Discharge: 2021-06-03 | Disposition: A | Discharge status: Home / Self Care | Payer: Medicare Other | Source: Ambulatory Visit | Attending: Family Medicine | Admitting: Family Medicine

## 2021-06-03 DIAGNOSIS — M25551 Pain in right hip: Secondary | ICD-10-CM

## 2021-06-11 ENCOUNTER — Ambulatory Visit: Payer: Medicare Other | Admitting: Interventional Cardiology

## 2021-06-22 ENCOUNTER — Other Ambulatory Visit: Payer: Self-pay | Admitting: Physical Medicine and Rehabilitation

## 2021-07-06 ENCOUNTER — Encounter
Payer: Medicare Other | Attending: Physical Medicine and Rehabilitation | Admitting: Physical Medicine and Rehabilitation

## 2021-07-15 ENCOUNTER — Other Ambulatory Visit: Payer: Self-pay | Admitting: Family Medicine

## 2021-07-15 DIAGNOSIS — M25551 Pain in right hip: Secondary | ICD-10-CM

## 2021-07-19 ENCOUNTER — Ambulatory Visit
Admission: RE | Admit: 2021-07-19 | Discharge: 2021-07-19 | Disposition: A | Discharge status: Home / Self Care | Payer: Medicare Other | Source: Ambulatory Visit | Attending: Family Medicine | Admitting: Family Medicine

## 2021-07-19 ENCOUNTER — Other Ambulatory Visit: Payer: Self-pay

## 2021-07-19 DIAGNOSIS — M25551 Pain in right hip: Secondary | ICD-10-CM

## 2021-08-05 ENCOUNTER — Other Ambulatory Visit: Payer: Medicare Other

## 2021-08-13 ENCOUNTER — Other Ambulatory Visit: Payer: Self-pay

## 2021-08-13 ENCOUNTER — Other Ambulatory Visit: Payer: Self-pay | Admitting: Student in an Organized Health Care Education/Training Program

## 2021-08-13 DIAGNOSIS — E119 Type 2 diabetes mellitus without complications: Secondary | ICD-10-CM

## 2021-08-13 MED ORDER — ROSUVASTATIN CALCIUM 5 MG PO TABS: 5.0000 mg | ORAL_TABLET | Freq: Every day | ORAL | 3 refills | Status: DC

## 2021-08-16 ENCOUNTER — Other Ambulatory Visit: Payer: Self-pay

## 2021-08-16 NOTE — Telephone Encounter (Signed)
[facility] sent a fax for Gabapentin refill. Patient was a no show for appointment on 07/06/21 ?

## 2021-08-17 MED ORDER — GABAPENTIN 100 MG PO CAPS: 200.0000 mg | ORAL_CAPSULE | Freq: Two times a day (BID) | ORAL | 0 refills | Status: AC

## 2021-08-26 ENCOUNTER — Other Ambulatory Visit: Payer: Medicare Other

## 2021-08-27 ENCOUNTER — Other Ambulatory Visit: Payer: Medicare Other

## 2021-09-07 ENCOUNTER — Ambulatory Visit: Payer: Medicare Other | Admitting: Cardiovascular Disease

## 2021-09-07 NOTE — Progress Notes (Deleted)
Cardiology Office Note   Date:  09/07/2021   ID:  Daniel Perkins, DOB [birth year], MRN [id number]  PCP:  Ellyn Hack, MD  Cardiologist:  Dr. Tenny Craw.   No chief complaint on file.     History of Present Illness: Daniel Perkins is a 66 y.o. male who is here today for a follow up visit regarding peripheral arterial disease. He has history of minimal coronary plaque by previous cardiac catheterization, diabetes, hypertension and hyperlipidemia.  He quit smoking in 2010.   Patient has prolonged symptoms of exertional shortness of breath and atypical chest pain.  He underwent extensive cardiac evaluation in the past including normal Lexiscan Myoview in December 2018.   CTA of the coronary arteries in May 2019  showed a calcium score of 5 with normal coronary arteries.  Echocardiogram in February 2020 showed normal LV systolic function with no significant valvular abnormalities.  He is followed for peripheral arterial disease with normal ABI on the left and mildly reduced on the right at 0.78 due to occluded mid SFA in a short segment with one vessel runoff below the knee via the peroneal artery. He has known history of clubfoot of both lower extremities which limits his physical activities.   He was treated medically given his minimal symptoms.  \He had repeat vascular testing in March of this year which showed an ABI of 0.66 on the right and 0.60 on the left.  In addition, there was significant difference in pressure between both arms with the right arm pressure much less than the left.  0  Past Medical History:  Diagnosis Date   Club foot of both lower extremities 1958   Coronary artery disease    Holter monitor 02/2012 - sinus with rare PVC   CTEV (congenital talipes equinovarus)    Gout    Hyperlipidemia    Hypertension    Myocardial infarction [facility]) ~ 2007   "mild"   Osteomyelitis (HCC)    Pneumonia 2000's   "couple times"   Pneumonia due to COVID-19 virus 04/28/2019    Type II diabetes mellitus (HCC) 2011    Past Surgical History:  Procedure Laterality Date   ANTERIOR CERVICAL DECOMP/DISCECTOMY FUSION  X 2   CARDIAC CATHETERIZATION     FOOT FRACTURE SURGERY Right 1990's   "pt a steel plate in"   FOOT SURGERY Left x16   "joints collapsed; keep getting infected"   LAPAROSCOPY N/A 03/05/2021   Procedure: LAPAROSCOPY DIAGNOSTIC;  Surgeon: Berna Bue, MD;  Location: [facility] OR;  Service: General;  Laterality: N/A;   LAPAROTOMY Right 03/05/2021   Procedure: EXPLORATORY LAPAROTOMY, REPAIR SMALL BOWEL PERFORATION;  Surgeon: Berna Bue, MD;  Location: [facility] OR;  Service: General;  Laterality: Right;   LYSIS OF ADHESION  03/05/2021   Procedure: LYSIS OF ADHESION;  Surgeon: Berna Bue, MD;  Location: [facility] OR;  Service: General;;   POSTERIOR CERVICAL FUSION/FORAMINOTOMY  X 2     Current Outpatient Medications  Medication Sig Dispense Refill   Accu-Chek Softclix Lancets lancets Check once a day 100 each 12   acetaminophen (TYLENOL) 325 MG tablet Take 2 tablets (650 mg total) by mouth with breakfast, with lunch, and with evening meal.     albuterol (PROAIR HFA) 108 (90 Base) MCG/ACT inhaler Inhale 1-2 puffs into the lungs every 6 (six) hours as needed for wheezing or shortness of breath. 8 g 1   amLODipine (NORVASC) 5 MG tablet Take 5 mg by mouth daily.  aspirin EC 81 MG tablet Take 1 tablet (81 mg total) by mouth daily. Swallow whole. 90 tablet 3   carvedilol (COREG) 3.125 MG tablet TAKE 1 TABLET BY MOUTH TWICE DAILY BEFORE A MEAL 60 tablet 0   colchicine 0.6 MG tablet Take 1 tablet (0.6 mg total) by mouth daily. 30 tablet 0   diclofenac Sodium (VOLTAREN) 1 % GEL Apply 2 g topically 4 (four) times daily. 350 g 0   gabapentin (NEURONTIN) 100 MG capsule Take 2 capsules (200 mg total) by mouth 2 (two) times daily. 120 capsule 0   glucose blood (ACCU-CHEK GUIDE) test strip Check once a day 100 each 12   insulin glargine (LANTUS) 100 UNIT/ML  Solostar Pen Inject 15 Units into the skin daily. 15 mL 11   Insulin Pen Needle 32G X 4 MM MISC Use to inject victoza one time a day 100 each 3   lidocaine (LIDODERM) 5 % Place 1 patch onto the skin daily. On for 12 hours and off for 12 hours daily--can change to use as needed. (Patient not taking: Reported on 05/31/2021) 30 patch 0   lip balm (CARMEX) ointment Apply topically as needed for lip care. 7 g 0   melatonin 3 MG TABS tablet Take 2 tablets (6 mg total) by mouth at bedtime. (Patient not taking: Reported on 05/31/2021) 60 tablet 0   Multiple Vitamin (MULTIVITAMIN WITH MINERALS) TABS tablet Take 1 tablet by mouth daily.     nitroGLYCERIN (NITROSTAT) 0.4 MG SL tablet DISSOLVE 1 TABLET UNDER THE TONGUE EVERY 5 MINUTES AS  NEEDED FOR CHEST PAIN , MAX 3 TABLETS IN 15 MINUTES,  CALL 911 IF NO RELIEF. 25 tablet 1   pantoprazole (PROTONIX) 40 MG tablet Take 1 tablet (40 mg total) by mouth 2 (two) times daily. 60 tablet 0   rosuvastatin (CRESTOR) 5 MG tablet Take 1 tablet (5 mg total) by mouth daily. 90 tablet 3   senna-docusate (SENOKOT-S) 8.6-50 MG tablet Take 2 tablets by mouth at bedtime. 60 tablet 0   valsartan (DIOVAN) 160 MG tablet Take 160 mg by mouth daily.     No current facility-administered medications for this visit.    Allergies:   Morphine and Pravastatin sodium    Social History:  The patient  reports that he quit smoking about 14 years ago. His smoking use included cigarettes. He has a 32.00 pack-year smoking history. He has never used smokeless tobacco. He reports current alcohol use. He reports that he does not use drugs.   Family History:  The patient's family history includes Coronary artery disease in his mother; Diabetes in his mother; Heart attack in his brother; Heart disease in his mother.    ROS:  Please see the history of present illness.   Otherwise, review of systems are positive for none.   All other systems are reviewed and negative.    PHYSICAL EXAM: VS:   There were no vitals taken for this visit. , BMI There is no height or weight on file to calculate BMI. GEN: Well nourished, well developed, in no acute distress  HEENT: normal  Neck: no JVD, carotid bruits, or masses Cardiac: RRR; no rubs, or gallops,no edema .  2 out of 6 systolic ejection murmur in the aortic area. Respiratory:  clear to auscultation bilaterally, normal work of breathing GI: soft, nontender, nondistended, + BS MS: no deformity or atrophy  Skin: warm and dry, no rash Neuro:  Strength and sensation are intact Psych: euthymic mood, full affect  Vascular: Femoral pulses normal bilaterally.  Distal pulses are not palpable on the right and faint on the left side.   EKG:  EKG is ordered today. EKG showed normal sinus rhythm with left anterior fascicular block and nonspecific ST changes.   Recent Labs: 03/26/2021: ALT 38 03/29/2021: Magnesium 1.7 04/05/2021: BUN 14; Creatinine, Ser 1.01; Potassium 4.3; Sodium 134 04/09/2021: Hemoglobin 11.7; Platelets 263    Lipid Panel    Component Value Date/Time   CHOL 213 (H) 02/13/2020 1116   TRIG 118 03/15/2021 0337   HDL 39 (L) 02/13/2020 1116   CHOLHDL 5.5 (H) 02/13/2020 1116   CHOLHDL 6.1 06/30/2014 1634   VLDL 45 (H) 06/30/2014 1634   LDLCALC 136 (H) 02/13/2020 1116      Wt Readings from Last 3 Encounters:  05/31/21 194 lb 9.6 oz (88.3 kg)  04/08/21 198 lb 6.6 oz (90 kg)  03/22/21 196 lb 13.9 oz (89.3 kg)          View : No data to display.            ASSESSMENT AND PLAN:  1.  Peripheral arterial disease: The patient reports minimal claudication at the present time.  I recommend continuing medical therapy and treatment of risk factors.    2.  Essential hypertension: Blood pressure is mildly elevated but he did not take his blood pressure medications yet.  3.  Hyperlipidemia: Continue treatment with rosuvastatin 20 mg daily.  Most recent lipid profile in July showed an LDL of 33.  His triglyceride was  mildly elevated at 278 likely due to suboptimal control of diabetes.  Hemoglobin A1c was 7.4.    Disposition:   FU with me in 12 months  Signed,  Lorine BearsMuhammad Ankith Edmonston, MD  09/07/2021 8:45 AM    [facility]

## 2021-09-11 ENCOUNTER — Encounter (HOSPITAL_COMMUNITY): Payer: Self-pay | Admitting: Emergency Medicine

## 2021-09-11 ENCOUNTER — Emergency Department (HOSPITAL_COMMUNITY)
Admission: EM | Admit: 2021-09-11 | Discharge: 2021-09-12 | Disposition: A | Discharge status: Home / Self Care | Payer: Medicare Other | Attending: Emergency Medicine | Admitting: Emergency Medicine

## 2021-09-11 ENCOUNTER — Emergency Department (HOSPITAL_COMMUNITY): Payer: Medicare Other

## 2021-09-11 ENCOUNTER — Other Ambulatory Visit: Payer: Self-pay

## 2021-09-11 DIAGNOSIS — M25512 Pain in left shoulder: Secondary | ICD-10-CM | POA: Insufficient documentation

## 2021-09-11 DIAGNOSIS — S022XXA Fracture of nasal bones, initial encounter for closed fracture: Secondary | ICD-10-CM | POA: Insufficient documentation

## 2021-09-11 DIAGNOSIS — S0992XA Unspecified injury of nose, initial encounter: Secondary | ICD-10-CM | POA: Diagnosis present

## 2021-09-11 MED ORDER — OXYCODONE-ACETAMINOPHEN 5-325 MG PO TABS
1.0000 | ORAL_TABLET | ORAL | Status: DC | PRN
  Administered 2021-09-11: 1 via ORAL
  Filled 2021-09-11: qty 1

## 2021-09-11 NOTE — ED Triage Notes (Signed)
Distal grip strength and radial pulse (2+), and sensation intact to L upper ext ?
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
## 2021-09-12 ENCOUNTER — Emergency Department (HOSPITAL_COMMUNITY): Payer: Medicare Other

## 2021-09-12 MED ORDER — HYDROCODONE-ACETAMINOPHEN 5-325 MG PO TABS: 1.0000 | ORAL_TABLET | ORAL | 0 refills | Status: DC | PRN

## 2021-09-12 MED ORDER — AMOXICILLIN-POT CLAVULANATE 875-125 MG PO TABS
1.0000 | ORAL_TABLET | Freq: Two times a day (BID) | ORAL | 0 refills | Status: DC
Start: 2021-09-12 — End: 2023-07-26

## 2021-09-12 MED ORDER — AMOXICILLIN-POT CLAVULANATE 875-125 MG PO TABS
1.0000 | ORAL_TABLET | Freq: Once | ORAL | Status: AC
  Administered 2021-09-12: 1 via ORAL
  Filled 2021-09-12: qty 1

## 2021-09-12 NOTE — ED Notes (Signed)
Pt provided with a phone to call [employer]  ?
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
## 2021-09-12 NOTE — Discharge Instructions (Addendum)
Take the prescribed medication as directed. ?Avoid blowing your nose, drinking through straw, etc. ?Follow-up with ENT-- call Monday to get this scheduled. ?Return to the ED for new or worsening symptoms. ?

## 2021-09-13 ENCOUNTER — Encounter: Payer: Self-pay | Admitting: Cardiovascular Disease

## 2021-09-20 ENCOUNTER — Other Ambulatory Visit: Payer: Self-pay

## 2021-09-20 ENCOUNTER — Encounter (HOSPITAL_BASED_OUTPATIENT_CLINIC_OR_DEPARTMENT_OTHER)
Admission: RE | Admit: 2021-09-20 | Discharge: 2021-09-20 | Disposition: A | Discharge status: Home / Self Care | Payer: Medicare Other | Source: Ambulatory Visit | Attending: Otolaryngology | Admitting: Otolaryngology

## 2021-09-20 ENCOUNTER — Encounter (HOSPITAL_BASED_OUTPATIENT_CLINIC_OR_DEPARTMENT_OTHER): Payer: Self-pay | Admitting: Otolaryngology

## 2021-09-20 ENCOUNTER — Other Ambulatory Visit: Payer: Self-pay | Admitting: Otolaryngology

## 2021-09-20 DIAGNOSIS — Z87891 Personal history of nicotine dependence: Secondary | ICD-10-CM | POA: Diagnosis not present

## 2021-09-20 DIAGNOSIS — Z01812 Encounter for preprocedural laboratory examination: Secondary | ICD-10-CM | POA: Diagnosis present

## 2021-09-20 DIAGNOSIS — S022XXA Fracture of nasal bones, initial encounter for closed fracture: Secondary | ICD-10-CM | POA: Diagnosis present

## 2021-09-20 DIAGNOSIS — I252 Old myocardial infarction: Secondary | ICD-10-CM | POA: Diagnosis not present

## 2021-09-20 DIAGNOSIS — Z8616 Personal history of COVID-19: Secondary | ICD-10-CM | POA: Diagnosis not present

## 2021-09-20 DIAGNOSIS — W19XXXA Unspecified fall, initial encounter: Secondary | ICD-10-CM | POA: Diagnosis not present

## 2021-09-20 DIAGNOSIS — Z79899 Other long term (current) drug therapy: Secondary | ICD-10-CM | POA: Diagnosis not present

## 2021-09-20 DIAGNOSIS — I10 Essential (primary) hypertension: Secondary | ICD-10-CM | POA: Diagnosis not present

## 2021-09-20 DIAGNOSIS — Z794 Long term (current) use of insulin: Secondary | ICD-10-CM | POA: Diagnosis not present

## 2021-09-20 DIAGNOSIS — I2510 Atherosclerotic heart disease of native coronary artery without angina pectoris: Secondary | ICD-10-CM | POA: Diagnosis not present

## 2021-09-20 LAB — COMPREHENSIVE METABOLIC PANEL
ALT: 16 U/L (ref 0–44)
AST: 20 U/L (ref 15–41)
Albumin: 3.6 g/dL (ref 3.5–5.0)
Alkaline Phosphatase: 121 U/L (ref 38–126)
Anion gap: 8 (ref 5–15)
BUN: 13 mg/dL (ref 8–23)
CO2: 23 mmol/L (ref 22–32)
Calcium: 9.3 mg/dL (ref 8.9–10.3)
Chloride: 107 mmol/L (ref 98–111)
Creatinine, Ser: 0.96 mg/dL (ref 0.61–1.24)
GFR, Estimated: >60 mL/min (ref >60)
Glucose, Bld: 108 mg/dL — ABNORMAL HIGH (ref 70–99)
Potassium: 4.1 mmol/L (ref 3.5–5.1)
Sodium: 138 mmol/L (ref 135–145)
Total Bilirubin: 0.6 mg/dL (ref 0.3–1.2)
Total Protein: 7.4 g/dL (ref 6.5–8.1)

## 2021-09-20 LAB — GLUCOSE, CAPILLARY: Glucose-Capillary: 105 mg/dL — ABNORMAL HIGH (ref 70–99)

## 2021-09-20 NOTE — Progress Notes (Signed)
CMP ordered vs BMP d/t patient no-showed for CMP ordered by cardiologist d/t recently started Crestor. ?

## 2021-09-20 NOTE — Anesthesia Preprocedure Evaluation (Addendum)
Anesthesia Evaluation  ?Patient identified by MRN, date of birth, ID band ? ?Reviewed: ?Allergy & Precautions, NPO status , Patient's Chart, lab work & pertinent test results ? ?Airway ?Mallampati: II ? ?TM Distance: >3 FB ?Neck ROM: Full ? ? ? Dental ?no notable dental hx. ?(+) Edentulous Upper ?  ?Pulmonary ?neg pulmonary ROS, former smoker,  ?  ?Pulmonary exam normal ?breath sounds clear to auscultation ? ? ? ? ? ? Cardiovascular ?hypertension, + CAD and + Past MI (MI 2007)  ?Normal cardiovascular exam ?Rhythm:Regular Rate:Normal ? ?06/2018 echo ??1. The left ventricle has normal systolic function with an ejection  ?fraction of 60-65%. The cavity size was normal. There is moderately  ?increased left ventricular wall thickness. Left ventricular diastolic  ?Doppler parameters are consistent with impaired  ??relaxation Indeterminent filling pressures The E/e' is 8-15.  ??2. The right ventricle has normal systolic function. The cavity was  ?normal. There is no increase in right ventricular wall thickness.  ??3. The mitral valve is degenerative. Mild thickening of the mitral valve  ?leaflet.  ?  ?Neuro/Psych ?negative neurological ROS ? negative psych ROS  ? GI/Hepatic ?GERD  ,  ?Endo/Other  ?diabetes, Poorly Controlled ? Renal/GU ?Renal disease  ? ?  ?Musculoskeletal ? ? Abdominal ?(+) Obesity: BMi 30.4.,   ?Peds ? Hematology ?  ?Anesthesia Other Findings ? ? Reproductive/Obstetrics ? ?  ? ? ? ? ? ? ? ? ? ? ? ? ? ?  ?  ? ? ? ? ? ? ?Anesthesia Physical ?Anesthesia Plan ? ?ASA: 3 ? ?Anesthesia Plan: General  ? ?Post-op Pain Management: Minimal or no pain anticipated  ? ?Induction: Intravenous ? ?PONV Risk Score and Plan: 3 and Treatment may vary due to age or medical condition, Midazolam and Ondansetron ? ?Airway Management Planned: LMA ? ?Additional Equipment: None ? ?Intra-op Plan:  ? ?Post-operative Plan:  ? ?Informed Consent: I have reviewed the patients History and Physical, chart,  labs and discussed the procedure including the risks, benefits and alternatives for the proposed anesthesia with the patient or authorized representative who has indicated his/her understanding and acceptance.  ? ? ? ?Dental advisory given ? ?Plan Discussed with: CRNA ? ?Anesthesia Plan Comments: (D/w PAT RN, pt ran out of insulin about 1 week ago and cannot afford to get more- will check glucose at PAT appt 5/15)  ? ? ? ? ?Anesthesia Quick Evaluation ? ?

## 2021-09-21 ENCOUNTER — Other Ambulatory Visit: Payer: Self-pay

## 2021-09-21 ENCOUNTER — Encounter (HOSPITAL_BASED_OUTPATIENT_CLINIC_OR_DEPARTMENT_OTHER): Payer: Self-pay | Admitting: Otolaryngology

## 2021-09-21 ENCOUNTER — Ambulatory Visit (HOSPITAL_BASED_OUTPATIENT_CLINIC_OR_DEPARTMENT_OTHER): Payer: Medicare Other | Admitting: Anesthesiology

## 2021-09-21 ENCOUNTER — Encounter (HOSPITAL_BASED_OUTPATIENT_CLINIC_OR_DEPARTMENT_OTHER)
Admission: RE | Disposition: A | Discharge status: Home / Self Care | Payer: Self-pay | Source: Home / Self Care | Attending: Otolaryngology

## 2021-09-21 ENCOUNTER — Ambulatory Visit (HOSPITAL_BASED_OUTPATIENT_CLINIC_OR_DEPARTMENT_OTHER)
Admission: RE | Admit: 2021-09-21 | Discharge: 2021-09-21 | Disposition: A | Discharge status: Home / Self Care | Payer: Medicare Other | Attending: Otolaryngology | Admitting: Otolaryngology

## 2021-09-21 DIAGNOSIS — I2510 Atherosclerotic heart disease of native coronary artery without angina pectoris: Secondary | ICD-10-CM

## 2021-09-21 DIAGNOSIS — I10 Essential (primary) hypertension: Secondary | ICD-10-CM

## 2021-09-21 DIAGNOSIS — Z79899 Other long term (current) drug therapy: Secondary | ICD-10-CM | POA: Insufficient documentation

## 2021-09-21 DIAGNOSIS — S022XXA Fracture of nasal bones, initial encounter for closed fracture: Secondary | ICD-10-CM | POA: Insufficient documentation

## 2021-09-21 DIAGNOSIS — W19XXXA Unspecified fall, initial encounter: Secondary | ICD-10-CM | POA: Insufficient documentation

## 2021-09-21 DIAGNOSIS — I252 Old myocardial infarction: Secondary | ICD-10-CM | POA: Insufficient documentation

## 2021-09-21 DIAGNOSIS — E119 Type 2 diabetes mellitus without complications: Secondary | ICD-10-CM

## 2021-09-21 DIAGNOSIS — Z01818 Encounter for other preprocedural examination: Secondary | ICD-10-CM

## 2021-09-21 DIAGNOSIS — Z8616 Personal history of COVID-19: Secondary | ICD-10-CM | POA: Insufficient documentation

## 2021-09-21 DIAGNOSIS — E7800 Pure hypercholesterolemia, unspecified: Secondary | ICD-10-CM

## 2021-09-21 DIAGNOSIS — Z87891 Personal history of nicotine dependence: Secondary | ICD-10-CM | POA: Insufficient documentation

## 2021-09-21 DIAGNOSIS — Z794 Long term (current) use of insulin: Secondary | ICD-10-CM | POA: Insufficient documentation

## 2021-09-21 HISTORY — PX: CLOSED REDUCTION NASAL FRACTURE: SHX5365

## 2021-09-21 LAB — GLUCOSE, CAPILLARY
Glucose-Capillary: 112 mg/dL — ABNORMAL HIGH (ref 70–99)
Glucose-Capillary: 104 mg/dL — ABNORMAL HIGH (ref 70–99)

## 2021-09-21 SURGERY — CLOSED REDUCTION, FRACTURE, NASAL BONE
Anesthesia: General | Site: Nose | Laterality: Bilateral

## 2021-09-21 MED ORDER — ONDANSETRON HCL 4 MG/2ML IJ SOLN
INTRAMUSCULAR | Status: DC | PRN
  Administered 2021-09-21: 4 mg via INTRAVENOUS

## 2021-09-21 MED ORDER — ACETAMINOPHEN 10 MG/ML IV SOLN
INTRAVENOUS | Status: AC
  Filled 2021-09-21: qty 100

## 2021-09-21 MED ORDER — FENTANYL CITRATE (PF) 100 MCG/2ML IJ SOLN
INTRAMUSCULAR | Status: DC | PRN
  Administered 2021-09-21 (×3): 25 ug via INTRAVENOUS

## 2021-09-21 MED ORDER — PROPOFOL 10 MG/ML IV BOLUS
INTRAVENOUS | Status: DC | PRN
Start: 2021-09-21 — End: 2021-09-21
  Administered 2021-09-21: 100 mg via INTRAVENOUS

## 2021-09-21 MED ORDER — MIDAZOLAM HCL 2 MG/2ML IJ SOLN
INTRAMUSCULAR | Status: AC
  Filled 2021-09-21: qty 2

## 2021-09-21 MED ORDER — DEXAMETHASONE SODIUM PHOSPHATE 10 MG/ML IJ SOLN
INTRAMUSCULAR | Status: DC | PRN
  Administered 2021-09-21: 10 mg via INTRAVENOUS

## 2021-09-21 MED ORDER — MIDAZOLAM HCL 2 MG/2ML IJ SOLN
INTRAMUSCULAR | Status: DC | PRN
Start: 2021-09-21 — End: 2021-09-21
  Administered 2021-09-21: 1 mg via INTRAVENOUS

## 2021-09-21 MED ORDER — FENTANYL CITRATE (PF) 100 MCG/2ML IJ SOLN
INTRAMUSCULAR | Status: AC
  Filled 2021-09-21: qty 2

## 2021-09-21 MED ORDER — ONDANSETRON HCL 4 MG/2ML IJ SOLN: 4.0000 mg | Freq: Once | INTRAMUSCULAR | Status: DC | PRN

## 2021-09-21 MED ORDER — OXYMETAZOLINE HCL 0.05 % NA SOLN
NASAL | Status: DC | PRN
  Administered 2021-09-21: 1 via TOPICAL

## 2021-09-21 MED ORDER — ACETAMINOPHEN 10 MG/ML IV SOLN
1000.0000 mg | Freq: Once | INTRAVENOUS | Status: DC | PRN
  Administered 2021-09-21: 1000 mg via INTRAVENOUS

## 2021-09-21 MED ORDER — LACTATED RINGERS IV SOLN
INTRAVENOUS | Status: DC | PRN
Start: 2021-09-21 — End: 2021-09-21

## 2021-09-21 MED ORDER — FENTANYL CITRATE (PF) 100 MCG/2ML IJ SOLN
25.0000 ug | INTRAMUSCULAR | Status: DC | PRN
  Administered 2021-09-21 (×3): 50 ug via INTRAVENOUS

## 2021-09-21 MED ORDER — DEXMEDETOMIDINE (PRECEDEX) IN NS 20 MCG/5ML (4 MCG/ML) IV SYRINGE
PREFILLED_SYRINGE | INTRAVENOUS | Status: DC | PRN
  Administered 2021-09-21: 8 ug via INTRAVENOUS

## 2021-09-21 SURGICAL SUPPLY — 13 items
APL SKNCLS STERI-STRIP NONHPOA (GAUZE/BANDAGES/DRESSINGS) ×1
BENZOIN TINCTURE PRP APPL 2/3 (GAUZE/BANDAGES/DRESSINGS) ×2 IMPLANT
CANISTER SUCT 1200ML W/VALVE (MISCELLANEOUS) ×2 IMPLANT
COVER BACK TABLE 60X90IN (DRAPES) ×2 IMPLANT
COVER MAYO STAND STRL (DRAPES) ×2 IMPLANT
GLOVE BIO SURGEON STRL SZ7.5 (GLOVE) ×2 IMPLANT
PATTIES SURGICAL .5 X3 (DISPOSABLE) ×2 IMPLANT
SPLINT NASAL DENVER LRG BLUSH (MISCELLANEOUS) ×2 IMPLANT
STRIP CLOSURE SKIN 1/2X4 (GAUZE/BANDAGES/DRESSINGS) ×2 IMPLANT
SUCTION FRAZIER HANDLE 10FR (MISCELLANEOUS) ×2
SUCTION TUBE FRAZIER 10FR DISP (MISCELLANEOUS) IMPLANT
TOWEL GREEN STERILE FF (TOWEL DISPOSABLE) ×2 IMPLANT
TUBE CONNECTING 20X1/4 (TUBING) ×2 IMPLANT

## 2021-09-21 NOTE — Discharge Instructions (Signed)
?  Post Anesthesia Home Care Instructions ? ?Activity: ?Get plenty of rest for the remainder of the day. A responsible individual must stay with you for 24 hours following the procedure.  ?For the next 24 hours, DO NOT: ?-Drive a car ?-Paediatric nurse ?-Drink alcoholic beverages ?-Take any medication unless instructed by your physician ?-Make any legal decisions or sign important papers. ? ?Meals: ?Start with liquid foods such as gelatin or soup. Progress to regular foods as tolerated. Avoid greasy, spicy, heavy foods. If nausea and/or vomiting occur, drink only clear liquids until the nausea and/or vomiting subsides. Call your physician if vomiting continues. ? ?Special Instructions/Symptoms: ?Your throat may feel dry or sore from the anesthesia or the breathing tube placed in your throat during surgery. If this causes discomfort, gargle with warm salt water. The discomfort should disappear within 24 hours. ? ?If you had a scopolamine patch placed behind your ear for the management of post- operative nausea and/or vomiting: ? ?1. The medication in the patch is effective for 72 hours, after which it should be removed.  Wrap patch in a tissue and discard in the trash. Wash hands thoroughly with soap and water. ?2. You may remove the patch earlier than 72 hours if you experience unpleasant side effects which may include dry mouth, dizziness or visual disturbances. ?3. Avoid touching the patch. Wash your hands with soap and water after contact with the patch. ?    ?No Tylenol until after 7:00pm today. ?

## 2021-09-21 NOTE — Anesthesia Procedure Notes (Signed)
Procedure Name: LMA Insertion ?Date/Time: 09/21/2021 12:04 PM ?Performed by: Karen Kitchens, CRNA ?Pre-anesthesia Checklist: Patient identified, Emergency Drugs available, Suction available and Patient being monitored ?Patient Re-evaluated:Patient Re-evaluated prior to induction ?Oxygen Delivery Method: Circle system utilized ?Preoxygenation: Pre-oxygenation with 100% oxygen ?Induction Type: IV induction ?Ventilation: Mask ventilation without difficulty ?LMA: LMA inserted ?LMA Size: 4.0 ?Number of attempts: 1 ?Airway Equipment and Method: Bite block ?Placement Confirmation: positive ETCO2 ?Tube secured with: Tape ?Dental Injury: Teeth and Oropharynx as per pre-operative assessment  ? ? ? ? ?
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
## 2021-09-21 NOTE — Transfer of Care (Signed)
Immediate Anesthesia Transfer of Care Note ? ?Patient: Daniel Perkins ? ?Procedure(s) Performed: CLOSED REDUCTION NASAL FRACTURE (Bilateral: Nose) ? ?Patient Location: PACU ? ?Anesthesia Type:General ? ?Level of Consciousness: awake, alert  and oriented ? ?Airway & Oxygen Therapy: Patient Spontanous Breathing and Patient connected to face mask oxygen ? ?Post-op Assessment: Report given to RN and Post -op Vital signs reviewed and stable ? ?Post vital signs: Reviewed and stable ? ?Last Vitals:  ?Vitals Value Taken Time  ?BP    ?Temp    ?Pulse 65 09/21/21 1217  ?Resp    ?SpO2 99 % 09/21/21 1217  ?Vitals shown include unvalidated device data. ? ?Last Pain:  ?Vitals:  ? 09/21/21 1029  ?TempSrc: Oral  ?PainSc: 0-No pain  ?   ? ?  ? ?Complications: No notable events documented. ?

## 2021-09-21 NOTE — Op Note (Signed)
Preop diagnosis: Displaced nasal fracture ?Postop diagnosis: same ?Procedure: Closed nasal reduction ?Surgeon: Redmond Baseman ?Anesth: General ?Compl: None ?Findings: Nasal pyramid entirely depressed with buckled nasal septum. ?Description:  After discussing risks, benefits, and alternatives, the patient was brought to the operating room and placed on the operative table in the supine position.  Anesthesia was induced and the patient was intubated by the anesthesia team with an LMA without difficulty.  The eyes were taped closed.  Afrin-soaked pledgets were placed in both sides of the nose for several minutes.  After removal, the nose was reduced using a Goldman elevator with bimanual manipulation until the nasal bones looked symmetric.  Afrin-soaked pledgets were replaced.  The external nose was painted with Benzoin and custom-cut Steri-strips were laid over the nose.  The Thermaplast splint was cut to fit the nose and placed in hot water until malleable.  It was then laid over the nose until it hardened in place.  Pledgets were removed and the nasal passages were suctioned.  The patient was returned to anesthesia for wake up and was extubated and moved to the recovery room in stable condition. ? ?

## 2021-09-21 NOTE — Anesthesia Postprocedure Evaluation (Signed)
Anesthesia Post Note ? ?Patient: Daniel Perkins ? ?Procedure(s) Performed: CLOSED REDUCTION NASAL FRACTURE (Bilateral: Nose) ? ?  ? ?Patient location during evaluation: PACU ?Anesthesia Type: General ?Level of consciousness: awake and alert ?Pain management: pain level controlled ?Vital Signs Assessment: post-procedure vital signs reviewed and stable ?Respiratory status: spontaneous breathing, nonlabored ventilation, respiratory function stable and patient connected to nasal cannula oxygen ?Cardiovascular status: blood pressure returned to baseline and stable ?Postop Assessment: no apparent nausea or vomiting ?Anesthetic complications: no ? ? ?No notable events documented. ? ?Last Vitals:  ?Vitals:  ? 09/21/21 1300 09/21/21 1315  ?BP: (!) 152/86 (!) 151/90  ?Pulse: 65 76  ?Resp: 18 16  ?Temp:    ?SpO2: 97% 93%  ?  ?Last Pain:  ?Vitals:  ? 09/21/21 1310  ?TempSrc:   ?PainSc: 4   ? ? ?  ?  ?  ?  ?  ?  ? ?[place]et [place] ? ? ? ? ?

## 2021-09-21 NOTE — Brief Op Note (Signed)
09/21/2021 ? ?12:10 PM ? ?PATIENT:  Daniel Perkins  66 y.o. male ? ?PRE-OPERATIVE DIAGNOSIS:  NASAL FRACTURE ? ?POST-OPERATIVE DIAGNOSIS:  NASAL FRACTURE ? ?PROCEDURE:  Procedure(s): ?CLOSED REDUCTION NASAL FRACTURE (Bilateral) ? ?SURGEON:  Surgeon(s) and Role: ?   Christia Reading, MD - Primary ? ?PHYSICIAN ASSISTANT:  ? ?ASSISTANTS: none  ? ?ANESTHESIA:   general ? ?EBL:  Minimal  ? ?BLOOD ADMINISTERED:none ? ?DRAINS: none  ? ?LOCAL MEDICATIONS USED:  NONE ? ?SPECIMEN:  No Specimen ? ?DISPOSITION OF SPECIMEN:  N/A ? ?COUNTS:  YES ? ?TOURNIQUET:  * No tourniquets in log * ? ?DICTATION: .Note written in EPIC ? ?PLAN OF CARE: Discharge to home after PACU ? ?PATIENT DISPOSITION:  PACU - hemodynamically stable. ?  ?Delay start of Pharmacological VTE agent (>24hrs) due to surgical blood loss or risk of bleeding: no ? ?

## 2021-09-22 ENCOUNTER — Other Ambulatory Visit: Payer: Medicare Other

## 2021-09-22 ENCOUNTER — Encounter (HOSPITAL_BASED_OUTPATIENT_CLINIC_OR_DEPARTMENT_OTHER): Payer: Self-pay | Admitting: Otolaryngology

## 2021-09-23 ENCOUNTER — Other Ambulatory Visit: Payer: Self-pay | Admitting: Physical Medicine and Rehabilitation

## 2021-09-23 ENCOUNTER — Other Ambulatory Visit: Payer: Self-pay | Admitting: Family Medicine

## 2021-09-23 ENCOUNTER — Ambulatory Visit
Admission: RE | Admit: 2021-09-23 | Discharge: 2021-09-23 | Disposition: A | Discharge status: Home / Self Care | Payer: Medicare Other | Source: Ambulatory Visit | Attending: Family Medicine | Admitting: Family Medicine

## 2021-09-23 DIAGNOSIS — M79604 Pain in right leg: Secondary | ICD-10-CM

## 2021-09-23 DIAGNOSIS — M542 Cervicalgia: Secondary | ICD-10-CM

## 2021-09-23 DIAGNOSIS — M25561 Pain in right knee: Secondary | ICD-10-CM

## 2021-09-23 DIAGNOSIS — E119 Type 2 diabetes mellitus without complications: Secondary | ICD-10-CM

## 2021-12-13 ENCOUNTER — Encounter (HOSPITAL_BASED_OUTPATIENT_CLINIC_OR_DEPARTMENT_OTHER): Payer: Medicare Other | Attending: Internal Medicine | Admitting: Internal Medicine

## 2022-02-24 ENCOUNTER — Other Ambulatory Visit: Payer: Self-pay | Admitting: Family Medicine

## 2022-02-25 ENCOUNTER — Other Ambulatory Visit: Payer: Self-pay | Admitting: Family Medicine

## 2022-02-25 DIAGNOSIS — M25512 Pain in left shoulder: Secondary | ICD-10-CM

## 2022-03-10 ENCOUNTER — Telehealth: Payer: Self-pay | Admitting: *Deleted

## 2022-03-10 NOTE — Telephone Encounter (Signed)
I placed call to patient to confirm dose he is taking and left message to call office. Patient also needs one year follow up with Dr Irish Lack or APP in late January /February 2024.

## 2022-03-10 NOTE — Telephone Encounter (Signed)
Jettie Booze, MD  Thompson Grayer, RN  Please clarify dose of rosuvastatin he is taking  Received fax from [facility] with the following information regarding prescriptions filled for patient  ROSUVASTATIN TAB 5MG  03/06/2022 [address] [place] MD [phone] [facility] [phone]  ROSUVASTATIN TAB 10MG  02/23/2022 90 [address] [facility] [place] MD [phone] [place] [phone]  ROSUVASTATIN TAB 5MG  12/01/2021 100 [address] [place]

## 2022-03-13 ENCOUNTER — Other Ambulatory Visit: Payer: Medicare Other

## 2022-03-14 NOTE — Telephone Encounter (Signed)
Our records indicate patient is taking Rosuvastatin 5 mg daily.  I spoke with patient.  He has both 5 mg and 10 mg tablets.  He has been taking 10 mg tablets prescribed by Dr Manuella Ghazi.  I told patient our records indicate he is taking 5 mg and I asked him to contact Dr Trena Platt office to see when change to 10 mg was made.  Patient just made appointment to see Ambrose Pancoast, NP on 11/6.  I asked him to bring all medications to this appointment

## 2022-03-15 ENCOUNTER — Telehealth: Payer: Self-pay | Admitting: Interventional Cardiology

## 2022-03-15 NOTE — Telephone Encounter (Signed)
I spoke with [facility] and they confirm patient is taking Rosuvastatin 10 mg daily.  Med list updated.

## 2022-03-15 NOTE — Telephone Encounter (Signed)
Pt c/o medication issue:  1. Name of Medication: rosuvastatin   2. How are you currently taking this medication (dosage and times per day)?   3. Are you having a reaction (difficulty breathing--STAT)? no  4. What is your medication issue? [place], from [facility] called to say patient started taking rosuvastatin as of 02/23/22.

## 2022-03-15 NOTE — Progress Notes (Signed)
Office Visit    Patient Name: Daniel Perkins Date of Encounter: 03/15/2022  Primary Care Provider:  Roselee Nova, MD Primary Cardiologist:  Larae Grooms, MD Primary Electrophysiologist: None  Chief Complaint    Daniel Perkins is a 66 y.o. male with PMH of DM type II, HLD, HMT, nonobstructive CAD and statin intolerance who presents today for complaint of shortness of breath.  Past Medical History    Past Medical History:  Diagnosis Date   Club foot of both lower extremities 1958   Coronary artery disease    Holter monitor 02/2012 - sinus with rare PVC   CTEV (congenital talipes equinovarus)    Gout    Hyperlipidemia    Hypertension    Myocardial infarction ([place]) ~ 2007   "mild"   Osteomyelitis ([place])    Pneumonia 2000's   "couple times"   Pneumonia due to COVID-19 virus 04/28/2019   Type II diabetes mellitus ([place]) 2011   Past Surgical History:  Procedure Laterality Date   ANTERIOR CERVICAL DECOMP/DISCECTOMY FUSION  X 2   CARDIAC CATHETERIZATION     CLOSED REDUCTION NASAL FRACTURE     when 66 years old   CLOSED REDUCTION NASAL FRACTURE Bilateral 09/21/2021   Procedure: CLOSED REDUCTION NASAL FRACTURE;  Surgeon: Melida Quitter, MD;  Location: [facility];  Service: ENT;  Laterality: Bilateral;   FOOT FRACTURE SURGERY Right 01/07/1989   "pt a steel plate in"   FOOT SURGERY Left x16   "joints collapsed; keep getting infected"   LAPAROSCOPY N/A 03/05/2021   Procedure: LAPAROSCOPY DIAGNOSTIC;  Surgeon: Clovis Riley, MD;  Location: [facility];  Service: General;  Laterality: N/A;   LAPAROTOMY Right 03/05/2021   Procedure: EXPLORATORY LAPAROTOMY, REPAIR SMALL BOWEL PERFORATION;  Surgeon: Clovis Riley, MD;  Location: [facility];  Service: General;  Laterality: Right;   LYSIS OF ADHESION  03/05/2021   Procedure: LYSIS OF ADHESION;  Surgeon: Clovis Riley, MD;  Location: [facility];  Service: General;;   POSTERIOR CERVICAL FUSION/FORAMINOTOMY  X  2    Allergies  Allergies  Allergen Reactions   Morphine Itching   Pravastatin Sodium Other (See Comments)    Severe muscle cramps with one time requiring admission.     History of Present Illness    Daniel Perkins  is a 66 year old male with the above mention past medical history who presents today for complaint of shortness of breath.  Right he had a nuclear stress test in 2018 that was low risk and echo was completed 2020 that showed EF of 60 to 65% with mild DD and no valvular disease.  In 02/2021 he underwent cardiac CTA that showed an increased calcium score of 67.9 and was unable to do CCTA due to increased heart rate.  He does endorse exertional dyspnea for multiple years and this can occur with walking or climbing stairs.  He is currently followed by Dr.Varanasi whom he last saw 05/2021 for follow-up.  He had no medication changes completed at that time and was not currently taking 81 mg aspirin with verification for clearance by his surgeon prior to beginning.  Daniel Perkins presents today for follow-up and complaint of shortness of breath alone.  Since last being seen in the office patient reports that he is experienced shortness of breath with burning sensation across his chest.  He has not taken his carvedilol in 2 weeks and his blood pressures have been elevated in the 123456 to A999333 systolically.  Today his blood pressure is controlled at 136/80.  He notes that his shortness of breath and chest burning has occurred more frequently since he has been off of his carvedilol.  During our visit he reported several medication compliance concerns.  He is planning to follow-up with his PCP regarding correct doses of insulin and was given clarification for his current Crestor dose of 10 mg.  Patient denies chest pain, palpitations, dyspnea, PND, orthopnea, nausea, vomiting, dizziness, syncope, edema, weight gain, or early satiety.   Home Medications    Current Outpatient Medications   Medication Sig Dispense Refill   Accu-Chek Softclix Lancets lancets Check once a day 100 each 12   acetaminophen (TYLENOL) 325 MG tablet Take 2 tablets (650 mg total) by mouth with breakfast, with lunch, and with evening meal.     albuterol (PROAIR HFA) 108 (90 Base) MCG/ACT inhaler Inhale 1-2 puffs into the lungs every 6 (six) hours as needed for wheezing or shortness of breath. 8 g 1   amLODipine (NORVASC) 5 MG tablet Take 5 mg by mouth daily.     amoxicillin-clavulanate (AUGMENTIN) 875-125 MG tablet Take 1 tablet by mouth every 12 (twelve) hours. 14 tablet 0   aspirin EC 81 MG tablet Take 1 tablet (81 mg total) by mouth daily. Swallow whole. 90 tablet 3   carvedilol (COREG) 3.125 MG tablet TAKE 1 TABLET BY MOUTH TWICE DAILY BEFORE A MEAL 60 tablet 0   colchicine 0.6 MG tablet Take 1 tablet (0.6 mg total) by mouth daily. 30 tablet 0   diclofenac Sodium (VOLTAREN) 1 % GEL Apply 2 g topically 4 (four) times daily. 350 g 0   gabapentin (NEURONTIN) 100 MG capsule Take 2 capsules (200 mg total) by mouth 2 (two) times daily. 120 capsule 0   glucose blood (ACCU-CHEK GUIDE) test strip Check once a day 100 each 12   HYDROcodone-acetaminophen (NORCO/VICODIN) 5-325 MG tablet Take 1 tablet by mouth every 4 (four) hours as needed. 12 tablet 0   insulin glargine (LANTUS) 100 UNIT/ML Solostar Pen Inject 15 Units into the skin daily. 15 mL 11   Insulin Pen Needle 32G X 4 MM MISC Use to inject victoza one time a day 100 each 3   Multiple Vitamin (MULTIVITAMIN WITH MINERALS) TABS tablet Take 1 tablet by mouth daily.     nitroGLYCERIN (NITROSTAT) 0.4 MG SL tablet DISSOLVE 1 TABLET UNDER THE TONGUE EVERY 5 MINUTES AS  NEEDED FOR CHEST PAIN , MAX 3 TABLETS IN 15 MINUTES,  CALL 911 IF NO RELIEF. 25 tablet 1   pantoprazole (PROTONIX) 40 MG tablet Take 1 tablet (40 mg total) by mouth 2 (two) times daily. 60 tablet 0   rosuvastatin (CRESTOR) 5 MG tablet Take 1 tablet (5 mg total) by mouth daily. 90 tablet 3    senna-docusate (SENOKOT-S) 8.6-50 MG tablet Take 2 tablets by mouth at bedtime. 60 tablet 0   valsartan (DIOVAN) 160 MG tablet Take 160 mg by mouth daily.     No current facility-administered medications for this visit.     Review of Systems  Please see the history of present illness.    (+) Shortness of breath (+) Chest burning  All other systems reviewed and are otherwise negative except as noted above.  Physical Exam    Wt Readings from Last 3 Encounters:  09/21/21 190 lb 11.2 oz (86.5 kg)  09/11/21 185 lb (83.9 kg)  05/31/21 194 lb 9.6 oz (88.3 kg)   ZD:GUYQI were no vitals filed for  this visit.,There is no height or weight on file to calculate BMI.  Constitutional:      Appearance: Healthy appearance. Not in distress.  Neck:     Vascular: JVD normal.  Pulmonary:     Effort: Pulmonary effort is normal.     Breath sounds: No wheezing. No rales. Diminished in the bases Cardiovascular:     Normal rate. Regular rhythm. Normal S1. Normal S2.      Murmurs: There is no murmur.  Edema:    Peripheral edema absent.  Abdominal:     Palpations: Abdomen is soft non tender. There is no hepatomegaly.  Skin:    General: Skin is warm and dry.  Neurological:     General: No focal deficit present.     Mental Status: Alert and oriented to person, place and time.     Cranial Nerves: Cranial nerves are intact.  EKG/LABS/Other Studies Reviewed    ECG personally reviewed by me today -sinus rhythm with left anterior fascicular block and possible LVH with rate of 99 bpm and no acute changes noted.  Lab Results  Component Value Date   WBC 4.3 04/09/2021   HGB 11.7 (L) 04/09/2021   HCT 36.9 (L) 04/09/2021   MCV 69.4 (L) 04/09/2021   PLT 263 04/09/2021   Lab Results  Component Value Date   CREATININE 0.96 09/20/2021   BUN 13 09/20/2021   NA 138 09/20/2021   K 4.1 09/20/2021   CL 107 09/20/2021   CO2 23 09/20/2021   Lab Results  Component Value Date   ALT 16 09/20/2021   AST  20 09/20/2021   ALKPHOS 121 09/20/2021   BILITOT 0.6 09/20/2021   Lab Results  Component Value Date   CHOL 213 (H) 02/13/2020   HDL 39 (L) 02/13/2020   LDLCALC 136 (H) 02/13/2020   TRIG 118 03/15/2021   CHOLHDL 5.5 (H) 02/13/2020    Lab Results  Component Value Date   HGBA1C 6.0 (H) 03/04/2021    Assessment & Plan    1.  Nonobstructive CAD: -Cardiac CT completed 2022 with calcium score of 67.9.  Testing unable to complete FFR due to elevated heart rate -Today he reports shortness of breath that occurs with chest burning that is relieved with rest. -We will send patient for a PET stress test for further evaluation and to rule out ischemia -Continue GDMT with Crestor 10 mg and patient encouraged to start ASA 81 mg -He was advised to contact our office if pain returns and to go to the ED for assistance if pain is not relieved with rest.  2.  Aortic atherosclerosis/peripheral artery disease: -Patient currently not smoking -Patient reports no claudication or leg pains currently. -Continue Crestor 10 mg daily  3.  Essential hypertension: -Patient's blood pressure today was controlled at 136/80 -He is currently noncompliant with his carvedilol due to running out not having refills.   -He will resume carvedilol 3.125 mg twice daily and continue Diovan 160 mg, and Norvasc 5 mg  4.  DM type II: -Patient is currently not taking Lantus and is taking Victoza and is unclear regarding status of Lantus. -He was advised to contact his PCP for clarification regarding his diabetes regimen.  5.  Dyspnea on exertion: -Patient reports increased dyspnea and chest burning that is relieved with rest. -He was advised to contact the office if there is any increased in intensity and is not relieved with rest.  Disposition: Follow-up with Larae Grooms, MD or APP in 2 weeks  Shared Decision Making/Informed Consent The risks [chest pain, shortness of breath, cardiac arrhythmias, dizziness, blood  pressure fluctuations, myocardial infarction, stroke/transient ischemic attack, nausea, vomiting, allergic reaction, radiation exposure, metallic taste sensation and life-threatening complications (estimated to be 1 in 10,000)], benefits (risk stratification, diagnosing coronary artery disease, treatment guidance) and alternatives of a cardiac PET stress test were discussed in detail with Daniel Perkins and he agrees to proceed.   Medication Adjustments/Labs and Tests Ordered: Current medicines are reviewed at length with the patient today.  Concerns regarding medicines are outlined above.   Signed, Mable Fill, Marissa Nestle, NP 03/15/2022, 2:01 PM  [facility]  Note:  This document was prepared using Dragon voice recognition software and may include unintentional dictation errors.

## 2022-03-16 ENCOUNTER — Other Ambulatory Visit: Payer: Medicare Other

## 2022-03-18 ENCOUNTER — Ambulatory Visit: Payer: Medicare Other | Admitting: Nurse Practitioner

## 2022-03-18 ENCOUNTER — Ambulatory Visit: Payer: Medicare Other | Attending: Nurse Practitioner | Admitting: Nurse Practitioner

## 2022-03-18 ENCOUNTER — Encounter: Payer: Self-pay | Admitting: Nurse Practitioner

## 2022-03-18 ENCOUNTER — Other Ambulatory Visit: Payer: Medicare Other

## 2022-03-18 VITALS — BP 136/80 | HR 99 | Ht 67.0 in | Wt 206.0 lb

## 2022-03-18 DIAGNOSIS — I10 Essential (primary) hypertension: Secondary | ICD-10-CM

## 2022-03-18 DIAGNOSIS — R079 Chest pain, unspecified: Secondary | ICD-10-CM | POA: Diagnosis not present

## 2022-03-18 DIAGNOSIS — E119 Type 2 diabetes mellitus without complications: Secondary | ICD-10-CM

## 2022-03-18 DIAGNOSIS — I739 Peripheral vascular disease, unspecified: Secondary | ICD-10-CM

## 2022-03-18 DIAGNOSIS — R0609 Other forms of dyspnea: Secondary | ICD-10-CM

## 2022-03-18 NOTE — Patient Instructions (Signed)
Medication Instructions:    MAKE SURE YOU RESTART: CARVEDILOL 3.125 MG TWICE A DAY   *If you need a refill on your cardiac medications before your next appointment, please call your pharmacy*   Lab Work: NONE ORDERED  TODAY    If you have labs (blood work) drawn today and your tests are completely normal, you will receive your results only by: MyChart Message (if you have MyChart) OR A paper copy in the mail If you have any lab test that is abnormal or we need to change your treatment, we will call you to review the results.   Testing/Procedures: Non-Cardiac CT scanning, (CAT scanning), is a noninvasive, special x-ray that produces cross-sectional images of the body using x-rays and a computer. CT scans help physicians diagnose and treat medical conditions. For some CT exams, a contrast material is used to enhance visibility in the area of the body being studied. CT scans provide greater clarity and reveal more details than regular x-ray exams.   Follow-Up: At [facility], you and your health needs are our priority.  As part of our continuing mission to provide you with exceptional heart care, we have created designated Provider Care Teams.  These Care Teams include your primary Cardiologist (physician) and Advanced Practice Providers (APPs -  Physician Assistants and Nurse Practitioners) who all work together to provide you with the care you need, when you need it.  We recommend signing up for the patient portal called "MyChart".  Sign up information is provided on this After Visit Summary.  MyChart is used to connect with patients for Virtual Visits (Telemedicine).  Patients are able to view lab/test results, encounter notes, upcoming appointments, etc.  Non-urgent messages can be sent to your provider as well.   To learn more about what you can do with MyChart, go to [email or web address].    Your next appointment:   3 month(s)  The format for your next appointment:   In  Person  Provider:   Robin Searing, NP        Important Information About [place]

## 2022-04-29 ENCOUNTER — Telehealth (HOSPITAL_COMMUNITY): Payer: Self-pay | Admitting: Emergency Medicine

## 2022-04-29 NOTE — Telephone Encounter (Signed)
Vm box full.

## 2022-04-29 NOTE — Telephone Encounter (Signed)
Attempted to call patient regarding upcoming cardiac PET appointment. Left message on voicemail with name and callback number Noheli Melder RN Navigator Cardiac Imaging  Heart [facility] V[facility] Services [phone] Office [phone] Cell  

## 2022-05-03 ENCOUNTER — Ambulatory Visit (HOSPITAL_COMMUNITY)
Admission: RE | Admit: 2022-05-03 | Discharge: 2022-05-03 | Disposition: A | Discharge status: Home / Self Care | Payer: Medicare Other | Source: Ambulatory Visit | Attending: Nurse Practitioner | Admitting: Nurse Practitioner

## 2022-05-03 ENCOUNTER — Telehealth: Payer: Self-pay

## 2022-05-03 DIAGNOSIS — R079 Chest pain, unspecified: Secondary | ICD-10-CM | POA: Insufficient documentation

## 2022-05-03 LAB — NM PET CT CARDIAC PERFUSION MULTI W/ABSOLUTE BLOODFLOW
MBFR: 2.03
Nuc Rest EF: 69 %
Nuc Stress EF: 73 %
Rest MBF: 1.44 ml/g/min
ST Depression (mm): 0 mm
Stress MBF: 2.92 ml/g/min
TID: 1

## 2022-05-03 MED ORDER — REGADENOSON 0.4 MG/5ML IV SOLN
INTRAVENOUS | Status: AC
  Filled 2022-05-03: qty 5

## 2022-05-03 MED ORDER — RUBIDIUM RB82 GENERATOR (RUBYFILL)
25.0000 | PACK | Freq: Once | INTRAVENOUS | Status: AC
  Administered 2022-05-03: 24.1 via INTRAVENOUS

## 2022-05-03 MED ORDER — REGADENOSON 0.4 MG/5ML IV SOLN
0.4000 mg | Freq: Once | INTRAVENOUS | Status: AC
  Administered 2022-05-03: 0.4 mg via INTRAVENOUS

## 2022-05-03 MED ORDER — RUBIDIUM RB82 GENERATOR (RUBYFILL)
25.0000 | PACK | Freq: Once | INTRAVENOUS | Status: AC
  Administered 2022-05-03: 24.2 via INTRAVENOUS

## 2022-05-03 NOTE — Telephone Encounter (Addendum)
Called the number listed for patient and could not leave a voice message due to mailbox being full. Will try calling again.   ----- Message from Gaston Islam., NP sent at 05/03/2022  1:59 PM EST ----- Please let [name] Leo Rod know that his PET/CT results revealed normal heart perfusion and low risk study.  These results are reassuring that your discomfort is not related to any decreased blood flow to your heart.  There was an incidental finding of a small hiatal hernia that can sometimes cause chest discomfort.  We will place a referral to GI for further evaluation.  Please let me know if you have any additional questions.  Robin Searing, NP

## 2022-05-10 ENCOUNTER — Other Ambulatory Visit: Payer: Self-pay | Admitting: Interventional Cardiology

## 2022-05-11 MED ORDER — ROSUVASTATIN CALCIUM 10 MG PO TABS: 10.0000 mg | ORAL_TABLET | Freq: Every day | ORAL | 3 refills | Status: AC

## 2022-06-23 NOTE — Progress Notes (Deleted)
Office Visit    Patient Name: Daniel Perkins Date of Encounter: 06/23/2022  Primary Care Provider:  Roselee Nova, MD Primary Cardiologist:  Larae Grooms, MD Primary Electrophysiologist: None  Chief Complaint    Daniel Perkins is a 67 y.o. male with PMH of DM type II, HLD, HMT, nonobstructive CAD and statin intolerance who presents today for follow-up of SOB.  Past Medical History    Past Medical History:  Diagnosis Date   Club foot of both lower extremities 1958   Coronary artery disease    Holter monitor 02/2012 - sinus with rare PVC   CTEV (congenital talipes equinovarus)    Gout    Hyperlipidemia    Hypertension    Myocardial infarction ([place]) ~ 2007   "mild"   Osteomyelitis ([place])    Pneumonia 2000's   "couple times"   Pneumonia due to COVID-19 virus 04/28/2019   Type II diabetes mellitus ([place]) 2011   Past Surgical History:  Procedure Laterality Date   ANTERIOR CERVICAL DECOMP/DISCECTOMY FUSION  X 2   CARDIAC CATHETERIZATION     CLOSED REDUCTION NASAL FRACTURE     when 67 years old   CLOSED REDUCTION NASAL FRACTURE Bilateral 09/21/2021   Procedure: CLOSED REDUCTION NASAL FRACTURE;  Surgeon: Melida Quitter, MD;  Location: [facility];  Service: ENT;  Laterality: Bilateral;   FOOT FRACTURE SURGERY Right 01/07/1989   "pt a steel plate in"   FOOT SURGERY Left x16   "joints collapsed; keep getting infected"   LAPAROSCOPY N/A 03/05/2021   Procedure: LAPAROSCOPY DIAGNOSTIC;  Surgeon: Clovis Riley, MD;  Location: [facility];  Service: General;  Laterality: N/A;   LAPAROTOMY Right 03/05/2021   Procedure: EXPLORATORY LAPAROTOMY, REPAIR SMALL BOWEL PERFORATION;  Surgeon: Clovis Riley, MD;  Location: [facility];  Service: General;  Laterality: Right;   LYSIS OF ADHESION  03/05/2021   Procedure: LYSIS OF ADHESION;  Surgeon: Clovis Riley, MD;  [facility];  Service: General;;   POSTERIOR CERVICAL FUSION/FORAMINOTOMY  X 2     Allergies  Allergies  Allergen Reactions   Morphine Itching   Pravastatin Sodium Other (See Comments)    Severe muscle cramps with one time requiring admission.     History of Present Illness    Daniel Perkins  is a 67 year old male with the above mention past medical history who presents today for complaint of shortness of breath.  Right he had a nuclear stress test in 2018 that was low risk and echo was completed 2020 that showed EF of 60 to 65% with mild DD and no valvular disease.  In 02/2021 he underwent cardiac CTA that showed an increased calcium score of 67.9 and was unable to do CCTA due to increased heart rate.  He does endorse exertional dyspnea for multiple years and this can occur with walking or climbing stairs.  He is currently followed by Dr.Varanasi whom he last saw 05/2021 for follow-up.  He had no medication changes completed at that time and was not currently taking 81 mg aspirin with verification for clearance by his surgeon prior to beginning.  He was seen in follow-up on 03/18/2022 with complaint of shortness of breath and burning sensation across his chest.  He reported that he had not taken his carvedilol for 2 weeks and blood pressures were in the [id number] to [id number] systolically.  During visit patient's blood pressure was 136/80.  He was sent for a PET stress test for restratification that  revealed normal low risk study with moderate coronary calcifications present.  Small hiatal hernia was noted and patient was referred to GI for management.  Since last being seen in the office patient reports***.  Patient denies chest pain, palpitations, dyspnea, PND, orthopnea, nausea, vomiting, dizziness, syncope, edema, weight gain, or early satiety.   ***Notes: -2D echo if DOE is still present Home Medications    Current Outpatient Medications  Medication Sig Dispense Refill   Accu-Chek Softclix Lancets lancets Check once a day 100 each 12   acetaminophen (TYLENOL) 325 MG  tablet Take 2 tablets (650 mg total) by mouth with breakfast, with lunch, and with evening meal.     albuterol (PROAIR HFA) 108 (90 Base) MCG/ACT inhaler Inhale 1-2 puffs into the lungs every 6 (six) hours as needed for wheezing or shortness of breath. 8 g 1   allopurinol (ZYLOPRIM) 100 MG tablet Take 100 mg by mouth 2 (two) times daily.     amLODipine (NORVASC) 5 MG tablet Take 5 mg by mouth daily.     amoxicillin-clavulanate (AUGMENTIN) 875-125 MG tablet Take 1 tablet by mouth every 12 (twelve) hours. 14 tablet 0   carvedilol (COREG) 3.125 MG tablet TAKE 1 TABLET BY MOUTH TWICE DAILY BEFORE A MEAL 60 tablet 0   colchicine 0.6 MG tablet Take 1 tablet (0.6 mg total) by mouth daily. 30 tablet 0   cyclobenzaprine (FLEXERIL) 5 MG tablet Take 5 mg by mouth every 8 (eight) hours as needed.     diclofenac Sodium (VOLTAREN) 1 % GEL Apply 2 g topically 4 (four) times daily. 350 g 0   gabapentin (NEURONTIN) 100 MG capsule Take 2 capsules (200 mg total) by mouth 2 (two) times daily. 120 capsule 0   glucose blood (ACCU-CHEK GUIDE) test strip Check once a day 100 each 12   HYDROcodone-acetaminophen (NORCO/VICODIN) 5-325 MG tablet Take 1 tablet by mouth every 4 (four) hours as needed. 12 tablet 0   insulin glargine (LANTUS) 100 UNIT/ML Solostar Pen Inject 15 Units into the skin daily. 15 mL 11   Insulin Pen Needle 32G X 4 MM MISC Use to inject victoza one time a day 100 each 3   Multiple Vitamin (MULTIVITAMIN WITH MINERALS) TABS tablet Take 1 tablet by mouth daily.     nitroGLYCERIN (NITROSTAT) 0.4 MG SL tablet DISSOLVE 1 TABLET UNDER THE TONGUE EVERY 5 MINUTES AS  NEEDED FOR CHEST PAIN , MAX 3 TABLETS IN 15 MINUTES,  CALL 911 IF NO RELIEF. 25 tablet 1   rosuvastatin (CRESTOR) 10 MG tablet Take 1 tablet (10 mg total) by mouth daily. 90 tablet 3   valsartan (DIOVAN) 160 MG tablet Take 160 mg by mouth daily.     No current facility-administered medications for this visit.     Review of Systems  Please see  the history of present illness.    (+)*** (+)***  All other systems reviewed and are otherwise negative except as noted above.  Physical Exam    Wt Readings from Last 3 Encounters:  03/18/22 206 lb (93.4 kg)  09/21/21 190 lb 11.2 oz (86.5 kg)  09/11/21 185 lb (83.9 kg)   [id number] were no vitals filed for this visit.,There is no height or weight on file to calculate BMI.  Constitutional:      Appearance: Healthy appearance. Not in distress.  Neck:     Vascular: JVD normal.  Pulmonary:     Effort: Pulmonary effort is normal.     Breath sounds: No wheezing. No  rales. Diminished in the bases Cardiovascular:     Normal rate. Regular rhythm. Normal S1. Normal S2.      Murmurs: There is no murmur.  Edema:    Peripheral edema absent.  Abdominal:     Palpations: Abdomen is soft non tender. There is no hepatomegaly.  Skin:    General: Skin is warm and dry.  Neurological:     General: No focal deficit present.     Mental Status: Alert and oriented to person, place and time.     Cranial Nerves: Cranial nerves are intact.  EKG/LABS/Other Studies Reviewed    ECG personally reviewed by me today - ***  Risk Assessment/Calculations:   {Does this patient have ATRIAL FIBRILLATION?[phone]}        Lab Results  Component Value Date   WBC 4.3 04/09/2021   HGB 11.7 (L) 04/09/2021   HCT 36.9 (L) 04/09/2021   MCV 69.4 (L) 04/09/2021   PLT 263 04/09/2021   Lab Results  Component Value Date   CREATININE 0.96 09/20/2021   BUN 13 09/20/2021   NA 138 09/20/2021   K 4.1 09/20/2021   CL 107 09/20/2021   CO2 23 09/20/2021   Lab Results  Component Value Date   ALT 16 09/20/2021   AST 20 09/20/2021   ALKPHOS 121 09/20/2021   BILITOT 0.6 09/20/2021   Lab Results  Component Value Date   CHOL 213 (H) 02/13/2020   HDL 39 (L) 02/13/2020   LDLCALC 136 (H) 02/13/2020   TRIG 118 03/15/2021   CHOLHDL 5.5 (H) 02/13/2020    Lab Results  Component Value Date   HGBA1C 6.0 (H)  03/04/2021    Assessment & Plan    1.  Nonobstructive CAD: -Cardiac CT completed 2022 with calcium score of 67.9.  Testing unable to complete FFR due to elevated heart rate -Today he reports -Continue GDMT with Crestor 10 mg and patient encouraged to start ASA 81 mg   2.  Aortic atherosclerosis/peripheral artery disease: -Patient currently not smoking -Patient reports no claudication or leg pains currently. -Continue Crestor 10 mg daily   3.  Essential hypertension: -Patient's blood pressure today was***   4.  DM type II: -Patient is currently not taking Lantus and is taking Victoza and is unclear regarding status of Lantus. -He was advised to contact his PCP for clarification regarding his diabetes regimen.   5.  Dyspnea on exertion: -Patient reports***      Disposition: Follow-up with Larae Grooms, MD or APP in *** months {Are you ordering a CV Procedure (e.g. stress test, cath, DCCV, TEE, etc)?   Press F2        :[id number]   Medication Adjustments/Labs and Tests Ordered: Current medicines are reviewed at length with the patient today.  Concerns regarding medicines are outlined above.   Signed, Mable Fill, Marissa Nestle, NP 06/23/2022, 12:36 PM [facility]  Note:  This document was prepared using Dragon voice recognition software and may include unintentional dictation errors.

## 2022-06-24 ENCOUNTER — Ambulatory Visit: Payer: Medicare (Managed Care) | Attending: Nurse Practitioner | Admitting: Nurse Practitioner

## 2022-06-24 DIAGNOSIS — I2510 Atherosclerotic heart disease of native coronary artery without angina pectoris: Secondary | ICD-10-CM

## 2022-06-24 DIAGNOSIS — E119 Type 2 diabetes mellitus without complications: Secondary | ICD-10-CM

## 2022-06-24 DIAGNOSIS — E782 Mixed hyperlipidemia: Secondary | ICD-10-CM

## 2022-06-24 DIAGNOSIS — R079 Chest pain, unspecified: Secondary | ICD-10-CM

## 2022-06-24 DIAGNOSIS — R0609 Other forms of dyspnea: Secondary | ICD-10-CM

## 2022-06-24 DIAGNOSIS — I10 Essential (primary) hypertension: Secondary | ICD-10-CM

## 2022-06-24 DIAGNOSIS — I739 Peripheral vascular disease, unspecified: Secondary | ICD-10-CM

## 2022-07-05 ENCOUNTER — Ambulatory Visit
Admission: RE | Admit: 2022-07-05 | Discharge: 2022-07-05 | Disposition: A | Discharge status: Home / Self Care | Payer: Medicare (Managed Care) | Source: Ambulatory Visit | Attending: Family Medicine | Admitting: Family Medicine

## 2022-07-05 ENCOUNTER — Other Ambulatory Visit: Payer: Self-pay | Admitting: Family Medicine

## 2022-07-05 DIAGNOSIS — M25551 Pain in right hip: Secondary | ICD-10-CM

## 2022-07-05 DIAGNOSIS — M25571 Pain in right ankle and joints of right foot: Secondary | ICD-10-CM

## 2022-11-08 ENCOUNTER — Other Ambulatory Visit: Payer: Self-pay | Admitting: Family Medicine

## 2022-11-08 ENCOUNTER — Ambulatory Visit
Admission: RE | Admit: 2022-11-08 | Discharge: 2022-11-08 | Disposition: A | Discharge status: Home / Self Care | Payer: Medicare (Managed Care) | Source: Ambulatory Visit | Attending: Family Medicine | Admitting: Family Medicine

## 2022-11-08 DIAGNOSIS — M5440 Lumbago with sciatica, unspecified side: Secondary | ICD-10-CM

## 2023-02-13 ENCOUNTER — Other Ambulatory Visit: Payer: Self-pay | Admitting: Family Medicine

## 2023-02-13 DIAGNOSIS — M1610 Unilateral primary osteoarthritis, unspecified hip: Secondary | ICD-10-CM

## 2023-04-18 ENCOUNTER — Ambulatory Visit
Admission: RE | Admit: 2023-04-18 | Discharge: 2023-04-18 | Disposition: A | Discharge status: Home / Self Care | Payer: Medicare (Managed Care) | Source: Ambulatory Visit | Attending: Family Medicine | Admitting: Family Medicine

## 2023-04-18 DIAGNOSIS — M1610 Unilateral primary osteoarthritis, unspecified hip: Secondary | ICD-10-CM

## 2023-06-27 ENCOUNTER — Ambulatory Visit (INDEPENDENT_AMBULATORY_CARE_PROVIDER_SITE_OTHER): Payer: Medicare (Managed Care) | Admitting: Orthopaedic Surgery

## 2023-06-27 ENCOUNTER — Other Ambulatory Visit (INDEPENDENT_AMBULATORY_CARE_PROVIDER_SITE_OTHER): Payer: Self-pay

## 2023-06-27 ENCOUNTER — Encounter: Payer: Self-pay | Admitting: Orthopaedic Surgery

## 2023-06-27 DIAGNOSIS — M1611 Unilateral primary osteoarthritis, right hip: Secondary | ICD-10-CM

## 2023-06-27 NOTE — Progress Notes (Signed)
Office Visit Note   Patient: Daniel Perkins           Date of Birth: 05/09/1956           MRN: [id number] Visit Date: 06/27/2023              Requested by: Ellyn Hack, MD [address] [place],  [place] [address] PCP: Ellyn Hack, MD   Assessment & Plan: Visit Diagnoses:  1. Primary osteoarthritis of right hip     Plan: Mr. Lucianne Lei is a 68 year old gentleman with end-stage right hip DJD with bone-on-bone and cystic changes.  After discussion of his options he would like to move forward with a right total hip arthroplasty.  We will recheck an A1c today.  This is acceptable then we will send a clearance to his PCP.  We will obtain new x-rays today.  Details of surgery including risk benefits prognosis reviewed.  Follow-Up Instructions: No follow-ups on file.   Orders:  Orders Placed This Encounter  Procedures   XR HIP UNILAT W OR W/O PELVIS 2-3 VIEWS RIGHT   No orders of the defined types were placed in this encounter.     Procedures: No procedures performed   Clinical Data: No additional findings.   Subjective: Chief Complaint  Patient presents with   Right Hip - Pain    HPI Jillyn Hidden is a very pleasant 68 year old gentleman here for evaluation of constant right hip pain in the groin and thigh area.  He is using a cane.  His pain is severe and affects ADLs.  Denies any recent injuries. Review of Systems  Constitutional: Negative.   HENT: Negative.    Eyes: Negative.   Respiratory: Negative.    Cardiovascular: Negative.   Gastrointestinal: Negative.   Endocrine: Negative.   Genitourinary: Negative.   Skin: Negative.   Allergic/Immunologic: Negative.   Neurological: Negative.   Hematological: Negative.   Psychiatric/Behavioral: Negative.    All other systems reviewed and are negative.    Objective: Vital Signs: There were no vitals taken for this visit.  Physical Exam Vitals and nursing note reviewed.  Constitutional:       Appearance: He is well-developed.  HENT:     Head: Normocephalic and atraumatic.  Eyes:     Pupils: Pupils are equal, round, and reactive to light.  Pulmonary:     Effort: Pulmonary effort is normal.  Abdominal:     Palpations: Abdomen is soft.  Musculoskeletal:        General: Normal range of motion.     Cervical back: Neck supple.  Skin:    General: Skin is warm.  Neurological:     Mental Status: He is alert and oriented to person, place, and time.  Psychiatric:        Behavior: Behavior normal.        Thought Content: Thought content normal.        Judgment: Judgment normal.     Ortho Exam Examination of the right hip shows significant pain with FADIR and logroll.  Weakness with hip flexion due to pain.  No trochanteric tenderness. Specialty Comments:  No specialty comments available.  Imaging: No results found.   PMFS History: Patient Active Problem List   Diagnosis Date Noted   Hx SBO 04/12/2021   Wound dehiscence, surgical 04/12/2021   Essential hypertension    Diabetes mellitus type 2 in nonobese (HCC)    Debility 03/25/2021   Malnutrition of moderate degree 03/11/2021  Left hand pain 06/22/2020   Back pain 05/20/2020   Neck pain 08/27/2019   CKD (chronic kidney disease), 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   Hepatic steatosis 05/01/2019   Prostate hypertrophy 12/06/2018   Sleep apnea-like behavior 10/01/2014   Peripheral vascular disease ([facility]CC) 02/10/2014   Microcytic anemia 02/06/2013   CTEV (congenital talipes equinovarus)    Statin intolerance 06/29/2012   Esophageal reflux 06/29/2012   Health care maintenance 06/28/2012   Gout 06/12/2012   Obesity, Class II, BMI 35-39.9 02/20/2012   DOE (dyspnea on exertion) 02/20/2012   Diabetes mellitus due to underlying condition, uncontrolled, with hyperglycemia (HCC) 09/06/2009   ERECTILE DYSFUNCTION, NON-ORGANIC 02/16/2009   Hyperlipemia 01/14/2009   HYPERTENSION 01/14/2009   Past Medical History:  Diagnosis Date    Club foot of both lower extremities 1958   Coronary artery disease    Holter monitor 02/2012 - sinus with rare PVC   CTEV (congenital talipes equinovarus)    Gout    Hyperlipidemia    Hypertension    Myocardial infarction ([facility]) ~ 2007   "mild"   Osteomyelitis ([facility])    Pneumonia 2000's   "couple times"   Pneumonia due to COVID-19 virus 04/28/2019   Type II diabetes mellitus ([facility]) 2011    Family History  Problem Relation Age of Onset   Diabetes Mother    Coronary artery disease Mother        HAS PACEMAKER   Heart disease Mother    Heart attack Brother    Lung disease Neg Hx     Past Surgical History:  Procedure Laterality Date   ANTERIOR CERVICAL DECOMP/DISCECTOMY FUSION  X 2   CARDIAC CATHETERIZATION     CLOSED REDUCTION NASAL FRACTURE     when 68 years old   CLOSED REDUCTION NASAL FRACTURE Bilateral 09/21/2021   Procedure: CLOSED REDUCTION NASAL FRACTURE;  Surgeon: Christia Reading, MD;  Location: [facility];  Service: ENT;  Laterality: Bilateral;   FOOT FRACTURE SURGERY Right 01/07/1989   "pt a steel plate in"   FOOT SURGERY Left x16   "joints collapsed; keep getting infected"   LAPAROSCOPY N/A 03/05/2021   Procedure: LAPAROSCOPY DIAGNOSTIC;  Surgeon: Berna Bue, MD;  Location[facility];  Service: General;  Laterality: N/A;   LAPAROTOMY Right 03/05/2021   Procedure: EXPLORATORY LAPAROTOMY, REPAIR SMALL BOWEL PERFORATION;  Surgeon: Berna Bue, MD;  Location[facility];  Service: General;  Laterality: Right;   LYSIS OF ADHESION  03/05/2021   Procedure: LYSIS OF ADHESION;  Surgeon: Berna Bue, MD;  Location[facility];  Service: General;;   POSTERIOR CERVICAL FUSION/FORAMINOTOMY  X 2   Social History   Occupational History    Employer: UNEMPLOYED   Occupation: unemployed  Tobacco Use   Smoking status: Former    Current packs/day: 0.00    Average packs/day: 1 pack/day for 32.0 years (32.0 ttl pk-yrs)    Types: Cigarettes    Start date: 07/06/1975     Quit date: 07/06/2007    Years since quitting: 15.9   Smokeless tobacco: Never  Vaping Use   Vaping status: Never Used  Substance and Sexual Activity   Alcohol use: Yes    Alcohol/week: 3.0 standard drinks of alcohol    Types: 3 Standard drinks or equivalent per week   Drug use: No   Sexual activity: Not on file

## 2023-06-28 LAB — EXTRA SPECIMEN

## 2023-06-28 LAB — HEMOGLOBIN A1C
Hgb A1c MFr Bld: 8 %{Hb} — ABNORMAL HIGH (ref <5.7)
Mean Plasma Glucose: 183 mg/dL
eAG (mmol/L): 10.1 mmol/L

## 2023-06-28 NOTE — Progress Notes (Signed)
Too high for surgery.  Needs to see his PCP to get it below 7.8.  thanks.

## 2023-07-26 ENCOUNTER — Encounter (HOSPITAL_COMMUNITY): Payer: Self-pay | Admitting: *Deleted

## 2023-07-26 ENCOUNTER — Ambulatory Visit (HOSPITAL_COMMUNITY)
Admission: EM | Admit: 2023-07-26 | Discharge: 2023-07-26 | Disposition: A | Discharge status: Home / Self Care | Payer: Medicare (Managed Care) | Attending: Emergency Medicine | Admitting: Emergency Medicine

## 2023-07-26 ENCOUNTER — Other Ambulatory Visit: Payer: Self-pay

## 2023-07-26 DIAGNOSIS — B349 Viral infection, unspecified: Secondary | ICD-10-CM

## 2023-07-26 DIAGNOSIS — R051 Acute cough: Secondary | ICD-10-CM

## 2023-07-26 DIAGNOSIS — R197 Diarrhea, unspecified: Secondary | ICD-10-CM | POA: Diagnosis not present

## 2023-07-26 LAB — POC COVID19/FLU A&B COMBO
Covid Antigen, POC: NEGATIVE
Influenza A Antigen, POC: NEGATIVE
Influenza B Antigen, POC: NEGATIVE

## 2023-07-26 MED ORDER — BENZONATATE 100 MG PO CAPS: 100.0000 mg | ORAL_CAPSULE | Freq: Three times a day (TID) | ORAL | 0 refills | Status: AC

## 2023-07-26 MED ORDER — LOPERAMIDE HCL 2 MG PO CAPS: 2.0000 mg | ORAL_CAPSULE | Freq: Four times a day (QID) | ORAL | 0 refills | Status: DC | PRN

## 2023-07-26 NOTE — ED Triage Notes (Signed)
 PT reports for the past 3 day she has had diarrhea,cough,sore throat ,HA and fatigue.

## 2023-07-26 NOTE — ED Provider Notes (Signed)
 [facility]    CSN: [id number] Arrival date & time: 07/26/23  1601      History   Chief Complaint Chief Complaint  Patient presents with   Sore Throat   Cough   Diarrhea    HPI Daniel Perkins is a 68 y.o. male.   Patient presents with cough, sore throat, headache, fatigue, congestion, diarrhea, and mild intermittent shortness of breath apiculate 3/16.  Denies chest pain, nausea, vomiting, abdominal pain, fever, body aches, and chills.  Denies taking any medications for symptoms.   Sore Throat  Cough Diarrhea   Past Medical History:  Diagnosis Date   Club foot of both lower extremities 1958   Coronary artery disease    Holter monitor 02/2012 - sinus with rare PVC   CTEV (congenital talipes equinovarus)    Gout    Hyperlipidemia    Hypertension    Myocardial infarction (HCC) ~ 2007   "mild"   Osteomyelitis (HCC)    Pneumonia 2000's   "couple times"   Pneumonia due to COVID-19 virus 04/28/2019   Type II diabetes mellitus (HCC) 2011    Patient Active Problem List   Diagnosis Date Noted   Hx SBO 04/12/2021   Wound dehiscence, surgical 04/12/2021   Essential hypertension    Diabetes mellitus type 2 in nonobese (HCC)    Debility 03/25/2021   Malnutrition of moderate degree 03/11/2021   Left hand pain 06/22/2020   Back pain 05/20/2020   Neck pain 08/27/2019   CKD (chronic kidney disease), 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   Hepatic steatosis 05/01/2019   Prostate hypertrophy 12/06/2018   Sleep apnea-like behavior 10/01/2014   Peripheral vascular disease (HCC) 02/10/2014   Microcytic anemia 02/06/2013   CTEV (congenital talipes equinovarus)    Statin intolerance 06/29/2012   Esophageal reflux 06/29/2012   Health care maintenance 06/28/2012   Gout 06/12/2012   Obesity, Class II, BMI 35-39.9 02/20/2012   DOE (dyspnea on exertion) 02/20/2012   Diabetes mellitus due to underlying condition, uncontrolled, with hyperglycemia (HCC) 09/06/2009   ERECTILE  DYSFUNCTION, NON-ORGANIC 02/16/2009   Hyperlipemia 01/14/2009   HYPERTENSION 01/14/2009    Past Surgical History:  Procedure Laterality Date   ANTERIOR CERVICAL DECOMP/DISCECTOMY FUSION  X 2   CARDIAC CATHETERIZATION     CLOSED REDUCTION NASAL FRACTURE     when 68 years old   CLOSED REDUCTION NASAL FRACTURE Bilateral 09/21/2021   Procedure: CLOSED REDUCTION NASAL FRACTURE;  Surgeon: Christia Reading, MD;  Location: [facility];  Service: ENT;  Laterality: Bilateral;   FOOT FRACTURE SURGERY Right 01/07/1989   "pt a steel plate in"   FOOT SURGERY Left x16   "joints collapsed; keep getting infected"   LAPAROSCOPY N/A 03/05/2021   Procedure: LAPAROSCOPY DIAGNOSTIC;  Surgeon: Berna Bue, MD;  Location: [facility] OR;  Service: General;  Laterality: N/A;   LAPAROTOMY Right 03/05/2021   Procedure: EXPLORATORY LAPAROTOMY, REPAIR SMALL BOWEL PERFORATION;  Surgeon: Berna Bue, MD;  Location: [facility];  Service: General;  Laterality: Right;   LYSIS OF ADHESION  03/05/2021   Procedure: LYSIS OF ADHESION;  Surgeon: Berna Bue, MD;  Location: [facility];  Service: General;;   POSTERIOR CERVICAL FUSION/FORAMINOTOMY  X 2       Home Medications    Prior to Admission medications   Medication Sig Start Date End Date Taking? Authorizing Provider  acetaminophen (TYLENOL) 325 MG tablet Take 2 tablets (650 mg total) by mouth with breakfast, with lunch, and with evening meal. 04/09/21  Yes Love, Evlyn Kanner, PA-C  allopurinol (ZYLOPRIM) 100 MG tablet Take 100 mg by mouth 2 (two) times daily. 02/26/22  Yes [provider]  amLODipine (NORVASC) 5 MG tablet Take 5 mg by mouth daily.   Yes [provider]  benzonatate (TESSALON) 100 MG capsule Take 1 capsule (100 mg total) by mouth every 8 (eight) hours. 07/26/23  Yes Susann Givens, Jhonatan Lomeli A, NP  carvedilol (COREG) 3.125 MG tablet TAKE 1 TABLET BY MOUTH TWICE DAILY BEFORE A MEAL 05/12/21  Yes Raulkar, Drema Pry, MD  colchicine 0.6 MG  tablet Take 1 tablet (0.6 mg total) by mouth daily. 04/09/21  Yes Love, Evlyn Kanner, PA-C  cyclobenzaprine (FLEXERIL) 5 MG tablet Take 5 mg by mouth every 8 (eight) hours as needed. 02/24/22  Yes [provider]  diclofenac Sodium (VOLTAREN) 1 % GEL Apply 2 g topically 4 (four) times daily. 04/09/21  Yes Love, Evlyn Kanner, PA-C  gabapentin (NEURONTIN) 100 MG capsule Take 2 capsules (200 mg total) by mouth 2 (two) times daily. 08/17/21  Yes Raulkar, Drema Pry, MD  glucose blood (ACCU-CHEK GUIDE) test strip Check once a day 10/11/19  Yes Claudean Severance, MD  HYDROcodone-acetaminophen (NORCO/VICODIN) 5-325 MG tablet Take 1 tablet by mouth every 4 (four) hours as needed. 09/12/21  Yes Garlon Hatchet, PA-C  loperamide (IMODIUM) 2 MG capsule Take 1 capsule (2 mg total) by mouth 4 (four) times daily as needed for diarrhea or loose stools. 07/26/23  Yes Wynonia Lawman A, NP  Multiple Vitamin (MULTIVITAMIN WITH MINERALS) TABS tablet Take 1 tablet by mouth daily. 03/26/21  Yes Almon Hercules, MD  rosuvastatin (CRESTOR) 10 MG tablet Take 1 tablet (10 mg total) by mouth daily. 05/11/22  Yes Corky Crafts, MD  valsartan (DIOVAN) 160 MG tablet Take 160 mg by mouth daily.   Yes [provider]  Accu-Chek Softclix Lancets lancets Check once a day 10/11/19   Claudean Severance, MD  insulin glargine (LANTUS) 100 UNIT/ML Solostar Pen Inject 15 Units into the skin daily. 04/09/21   Love, Evlyn Kanner, PA-C  Insulin Pen Needle 32G X 4 MM MISC Use to inject victoza one time a day 11/15/17   Tyson Alias, MD  nitroGLYCERIN (NITROSTAT) 0.4 MG SL tablet DISSOLVE 1 TABLET UNDER THE TONGUE EVERY 5 MINUTES AS  NEEDED FOR CHEST PAIN , MAX 3 TABLETS IN 15 MINUTES,  CALL 911 IF NO RELIEF. 04/07/20   Belva Agee, MD    Family History Family History  Problem Relation Age of Onset   Diabetes Mother    Coronary artery disease Mother        HAS PACEMAKER   Heart disease Mother    Heart attack Brother     Lung disease Neg Hx     Social History Social History   Tobacco Use   Smoking status: Former    Current packs/day: 0.00    Average packs/day: 1 pack/day for 32.0 years (32.0 ttl pk-yrs)    Types: Cigarettes    Start date: 07/06/1975    Quit date: 07/06/2007    Years since quitting: 16.0   Smokeless tobacco: Never  Vaping Use   Vaping status: Never Used  Substance Use Topics   Alcohol use: Yes    Alcohol/week: 3.0 standard drinks of alcohol    Types: 3 Standard drinks or equivalent per week   Drug use: No     Allergies   Morphine and Pravastatin sodium   Review of Systems Review of  Systems  Respiratory:  Positive for cough.   Gastrointestinal:  Positive for diarrhea.   Per HPI  Physical Exam Triage Vital Signs ED Triage Vitals  Encounter Vitals Group     BP 07/26/23 1649 121/79     Systolic BP Percentile --      Diastolic BP Percentile --      Pulse Rate 07/26/23 1649 (!) 103     Resp 07/26/23 1649 20     Temp 07/26/23 1649 98.4 F (36.9 C)     Temp src --      SpO2 07/26/23 1649 93 %     Weight --      Height --      Head Circumference --      Peak Flow --      Pain Score 07/26/23 1646 6     Pain Loc --      Pain Education --      Exclude from Growth Chart --    No data found.  Updated Vital Signs BP 121/79   Pulse (!) 103   Temp 98.4 F (36.9 C)   Resp 20   SpO2 93%   Visual Acuity Right Eye Distance:   Left Eye Distance:   Bilateral Distance:    Right Eye Near:   Left Eye Near:    Bilateral Near:     Physical Exam Vitals and nursing note reviewed.  Constitutional:      General: He is awake. He is not in acute distress.    Appearance: Normal appearance. He is well-developed and well-groomed. He is ill-appearing. He is not toxic-appearing.  HENT:     Right Ear: Tympanic membrane, ear canal and external ear normal.     Left Ear: Tympanic membrane, ear canal and external ear normal.     Nose: Congestion and rhinorrhea present.      Mouth/Throat:     Mouth: Mucous membranes are moist.     Pharynx: Posterior oropharyngeal erythema present. No oropharyngeal exudate.  Cardiovascular:     Rate and Rhythm: Normal rate and regular rhythm.  Pulmonary:     Effort: Pulmonary effort is normal.     Breath sounds: Normal breath sounds.  Skin:    General: Skin is warm and dry.  Neurological:     Mental Status: He is alert.  Psychiatric:        Behavior: Behavior is cooperative.      UC Treatments / Results  Labs (all labs ordered are listed, but only abnormal results are displayed) Labs Reviewed  POC COVID19/FLU A&B COMBO    EKG   Radiology No results found.  Procedures Procedures (including critical care time)  Medications Ordered in UC Medications - No data to display  Initial Impression / Assessment and Plan / UC Course  I have reviewed the triage vital signs and the nursing notes.  Pertinent labs & imaging results that were available during my care of the patient were reviewed by me and considered in my medical decision making (see chart for details).     Upon assessment patient is ill-appearing.  Congestion and rhinorrhea are present, mild erythema noted to pharynx.  Lungs clear bilaterally on auscultation.  No other significant findings upon exam.  Flu and COVID testing were negative.  Prescribed Tessalon as needed for cough.  Prescribed Imodium as needed for diarrhea.  Discussed over-the-counter medications for viral illness related symptoms.  Discussed importance of hydration.  Discussed return precautions. Final Clinical Impressions(s) / UC Diagnoses  Final diagnoses:  Viral illness  Diarrhea, unspecified type  Acute cough     Discharge Instructions      You tested negative for flu and COVID today.  I believe your symptoms are likely related to a viral illness.  I have prescribed Tessalon that you can take every 8 hours as needed for cough.  I have also prescribed Imodium that you can  take up to 4 times daily as needed for diarrhea.    Due to your history of high blood pressure and recommend taking Coricidin as needed for cough and congestion.  You can take Tylenol as needed for sore throat and headache.  Make sure you are staying hydrated and getting some rest.  Return here if her symptoms persist or worsen.     ED Prescriptions     Medication Sig Dispense Auth. Provider   benzonatate (TESSALON) 100 MG capsule Take 1 capsule (100 mg total) by mouth every 8 (eight) hours. 21 capsule Wynonia Lawman A, NP   loperamide (IMODIUM) 2 MG capsule Take 1 capsule (2 mg total) by mouth 4 (four) times daily as needed for diarrhea or loose stools. 12 capsule Wynonia Lawman A, NP      PDMP not reviewed this encounter.   Wynonia Lawman A, NP 07/26/23 [phone]

## 2023-07-26 NOTE — Discharge Instructions (Addendum)
 You tested negative for flu and COVID today.  I believe your symptoms are likely related to a viral illness.  I have prescribed Tessalon that you can take every 8 hours as needed for cough.  I have also prescribed Imodium that you can take up to 4 times daily as needed for diarrhea.    Due to your history of high blood pressure and recommend taking Coricidin as needed for cough and congestion.  You can take Tylenol as needed for sore throat and headache.  Make sure you are staying hydrated and getting some rest.  Return here if her symptoms persist or worsen.

## 2023-10-12 ENCOUNTER — Telehealth: Payer: Self-pay | Admitting: Orthopaedic Surgery

## 2023-10-12 NOTE — Telephone Encounter (Signed)
 Pt states he is ready to have surgery and that his A1C is down

## 2023-10-26 ENCOUNTER — Telehealth: Payer: Self-pay | Admitting: Orthopaedic Surgery

## 2023-10-26 NOTE — Telephone Encounter (Signed)
 Patient will need cardiac clearance prior to total hip replacement with Dr. Christiane Cowing.  Patient had a cardiology appointment with Charles Connor, NP 06/24/22 for a 3 month follow up but was a no-show because he states he was not aware of appointment .  He said he received a letter from Dr. Jacquelynn Matter that he would no longer be his cardiologist.   A cardiology clearance appointment was made for 11-20-23 with Palmer Bobo, NP.  Patient also mentions he is in need of a foot doctor because of problems with great toe on both his left and right foot. Patient has seen Dr. Julio Ohm in the past.

## 2023-10-26 NOTE — Telephone Encounter (Signed)
 Patient would like to proceed with having right total hip replacement and requests something after July 4th.  He was here in February of this year because he states he had bone on bone in his hip but could not have surgery because he need to work on his A1c.  Today he reports his A1c is at 6.8 on 10-12-23 when he was at [facility] on [address] [place] [address] and saw Dr. Mason Sole.  I told patient we could hold our earliest date in August but get started with get clearances in the meantime.  Patient is agreeable to August 11th at [facility].

## 2023-11-06 ENCOUNTER — Encounter: Payer: Self-pay | Admitting: Orthopedic Surgery

## 2023-11-06 ENCOUNTER — Ambulatory Visit (INDEPENDENT_AMBULATORY_CARE_PROVIDER_SITE_OTHER): Payer: Medicare (Managed Care) | Admitting: Orthopedic Surgery

## 2023-11-06 DIAGNOSIS — B351 Tinea unguium: Secondary | ICD-10-CM

## 2023-11-06 NOTE — Progress Notes (Signed)
 Office Visit Note   Patient: Daniel Perkins           Date of Birth: 10-15-1955           MRN: [id number] Visit Date: 11/06/2023              Requested by: Maree Leni Edyth DELENA, MD [address] [place],  [place] [address] PCP: Maree Leni Edyth DELENA, MD  Chief Complaint  Patient presents with   Left Foot - Follow-up   Right Foot - Follow-up      HPI: Patient is a 68 year old gentleman who states that he is scheduled for total hip arthroplasty with Dr. Jerri.  Patient states that he recently has been having increased pain with the nails of both feet with concern for infection.  Assessment & Plan: Visit Diagnoses:  1. Onychomycosis     Plan: Nails were trimmed x 10 without complication no signs of paronychial infection.  Follow-Up Instructions: Return in about 3 months (around 02/06/2024).   Ortho Exam  Patient is alert, oriented, no adenopathy, well-dressed, normal affect, normal respiratory effort. Examination patient has thickened discolored onychomycotic nails x 10 that he is unable to safely trim on his own.  There is no signs of paronychial infection no drainage no cellulitis.  There are no plantar ulcers.  After informed consent the nails were trimmed x 10 without complication.    Imaging: No results found. No images are attached to the encounter.  Labs: Lab Results  Component Value Date   HGBA1C 8.0 (H) 06/27/2023   HGBA1C 6.0 (H) 03/04/2021   HGBA1C 7.7 (A) 05/19/2020   ESRSEDRATE 115 (H) 03/18/2021   ESRSEDRATE 34 (H) 11/17/2007   CRP 9.5 (H) 03/18/2021   CRP 0.8 05/06/2019   CRP 1.9 (H) 05/05/2019   LABURIC 6.8 03/11/2021   LABURIC 5.8 12/06/2018   LABURIC 4.8 04/25/2017   REPTSTATUS 05/03/2019 FINAL 04/28/2019   GRAMSTAIN  12/11/2008    RARE WBC PRESENT, PREDOMINANTLY PMN FEW SQUAMOUS EPITHELIAL CELLS PRESENT RARE GRAM POSITIVE COCCI IN PAIRS   GRAMSTAIN  12/11/2008    RARE WBC PRESENT, PREDOMINANTLY PMN FEW SQUAMOUS EPITHELIAL CELLS PRESENT RARE  GRAM POSITIVE COCCI IN PAIRS   CULT  04/28/2019    NO GROWTH 5 DAYS Performed at [facility], [address], [place], [place] [address]    [facility] ENTEROCOCCUS SPECIES 07/17/2007     Lab Results  Component Value Date   ALBUMIN 3.6 09/20/2021   ALBUMIN 2.5 (L) 03/26/2021   ALBUMIN 2.1 (L) 03/19/2021    Lab Results  Component Value Date   MG 1.7 03/29/2021   MG 2.1 03/18/2021   MG 2.1 03/15/2021   Lab Results  Component Value Date   VD25OH 32.61 03/29/2021    No results found for: PREALBUMIN    Latest Ref Rng & Units 04/09/2021    5:59 AM 03/29/2021    3:19 AM 03/26/2021    6:08 AM  CBC EXTENDED  WBC 4.0 - 10.5 K/uL 4.3  5.5  6.0   RBC 4.22 - 5.81 MIL/uL 5.32  4.98  5.41   Hemoglobin 13.0 - 17.0 g/dL 88.2  89.1  88.0   HCT 39.0 - 52.0 % 36.9  35.5  37.2   Platelets 150 - 400 K/uL 263  342  367   NEUT# 1.7 - 7.7 K/uL 1.5   2.6   Lymph# 0.7 - 4.0 K/uL 1.6   2.0      There is no height or  weight on file to calculate BMI.  Orders:  No orders of the defined types were placed in this encounter.  No orders of the defined types were placed in this encounter.    Procedures: No procedures performed  Clinical Data: No additional findings.  ROS:  All other systems negative, except as noted in the HPI. Review of Systems  Objective: Vital Signs: There were no vitals taken for this visit.  Specialty Comments:  No specialty comments available.  PMFS History: Patient Active Problem List   Diagnosis Date Noted   Hx SBO 04/12/2021   Wound dehiscence, surgical 04/12/2021   Essential hypertension    Diabetes mellitus type 2 in nonobese (HCC)    Debility 03/25/2021   Malnutrition of moderate degree 03/11/2021   Left hand pain 06/22/2020   Back pain 05/20/2020   Neck pain 08/27/2019   CKD (chronic kidney disease), 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   Hepatic steatosis 05/01/2019   Prostate hypertrophy 12/06/2018   Sleep apnea-like behavior 10/01/2014    Peripheral vascular disease (HCC) 02/10/2014   Microcytic anemia 02/06/2013   CTEV (congenital talipes equinovarus)    Statin intolerance 06/29/2012   Esophageal reflux 06/29/2012   Health care maintenance 06/28/2012   Gout 06/12/2012   Obesity, Class II, BMI 35-39.9 02/20/2012   DOE (dyspnea on exertion) 02/20/2012   Diabetes mellitus due to underlying condition, uncontrolled, with hyperglycemia (HCC) 09/06/2009   ERECTILE DYSFUNCTION, NON-ORGANIC 02/16/2009   Hyperlipemia 01/14/2009   HYPERTENSION 01/14/2009   Past Medical History:  Diagnosis Date   Club foot of both lower extremities 1958   Coronary artery disease    Holter monitor 02/2012 - sinus with rare PVC   CTEV (congenital talipes equinovarus)    Gout    Hyperlipidemia    Hypertension    Myocardial infarction (HCC) ~ 2007   mild   Osteomyelitis (HCC)    Pneumonia 2000's   couple times   Pneumonia due to COVID-19 virus 04/28/2019   Type II diabetes mellitus (HCC) 2011    Family History  Problem Relation Age of Onset   Diabetes Mother    Coronary artery disease Mother        HAS PACEMAKER   Heart disease Mother    Heart attack Brother    Lung disease Neg Hx     Past Surgical History:  Procedure Laterality Date   ANTERIOR CERVICAL DECOMP/DISCECTOMY FUSION  X 2   CARDIAC CATHETERIZATION     CLOSED REDUCTION NASAL FRACTURE     when 68 years old   CLOSED REDUCTION NASAL FRACTURE Bilateral 09/21/2021   Procedure: CLOSED REDUCTION NASAL FRACTURE;  Surgeon: Carlie Clark, MD;  Location: [facility];  Service: ENT;  Laterality: Bilateral;   FOOT FRACTURE SURGERY Right 01/07/1989   pt a steel plate in   FOOT SURGERY Left x16   joints collapsed; keep getting infected   LAPAROSCOPY N/A 03/05/2021   Procedure: LAPAROSCOPY DIAGNOSTIC;  Surgeon: Signe Mitzie LABOR, MD;  Location: [facility];  Service: General;  Laterality: N/A;   LAPAROTOMY Right 03/05/2021   Procedure: EXPLORATORY LAPAROTOMY, REPAIR  SMALL BOWEL PERFORATION;  Surgeon: Signe Mitzie LABOR, MD;  Location: [facility];  Service: General;  Laterality: Right;   LYSIS OF ADHESION  03/05/2021   Procedure: LYSIS OF ADHESION;  Surgeon: Signe Mitzie LABOR, MD;  Location: [facility];  Service: General;;   POSTERIOR CERVICAL FUSION/FORAMINOTOMY  X 2   Social History   Occupational History    Employer: UNEMPLOYED   Occupation: unemployed  Tobacco Use   Smoking status: Former    Current packs/day: 0.00    Average packs/day: 1 pack/day for 32.0 years (32.0 ttl pk-yrs)    Types: Cigarettes    Start date: 07/06/1975    Quit date: 07/06/2007    Years since quitting: 16.3   Smokeless tobacco: Never  Vaping Use   Vaping status: Never Used  Substance and Sexual Activity   Alcohol use: Yes    Alcohol/week: 3.0 standard drinks of alcohol    Types: 3 Standard drinks or equivalent per week   Drug use: No   Sexual activity: Not on file

## 2023-11-20 ENCOUNTER — Telehealth: Payer: Self-pay | Admitting: *Deleted

## 2023-11-20 ENCOUNTER — Ambulatory Visit: Payer: Medicare (Managed Care) | Attending: Emergency Medicine | Admitting: Emergency Medicine

## 2023-11-20 ENCOUNTER — Encounter: Payer: Self-pay | Admitting: Emergency Medicine

## 2023-11-20 VITALS — BP 120/70 | HR 85 | Ht 68.0 in | Wt 214.0 lb

## 2023-11-20 DIAGNOSIS — I739 Peripheral vascular disease, unspecified: Secondary | ICD-10-CM

## 2023-11-20 DIAGNOSIS — I2510 Atherosclerotic heart disease of native coronary artery without angina pectoris: Secondary | ICD-10-CM | POA: Diagnosis not present

## 2023-11-20 DIAGNOSIS — I10 Essential (primary) hypertension: Secondary | ICD-10-CM | POA: Diagnosis not present

## 2023-11-20 DIAGNOSIS — Z01810 Encounter for preprocedural cardiovascular examination: Secondary | ICD-10-CM | POA: Diagnosis not present

## 2023-11-20 DIAGNOSIS — E118 Type 2 diabetes mellitus with unspecified complications: Secondary | ICD-10-CM

## 2023-11-20 DIAGNOSIS — Z794 Long term (current) use of insulin: Secondary | ICD-10-CM

## 2023-11-20 DIAGNOSIS — I340 Nonrheumatic mitral (valve) insufficiency: Secondary | ICD-10-CM

## 2023-11-20 DIAGNOSIS — E785 Hyperlipidemia, unspecified: Secondary | ICD-10-CM | POA: Diagnosis not present

## 2023-11-20 DIAGNOSIS — R0609 Other forms of dyspnea: Secondary | ICD-10-CM

## 2023-11-20 NOTE — Telephone Encounter (Signed)
   Pre-operative Risk Assessment    Patient Name: Daniel Perkins  DOB: 1955/07/17 MRN: [id number]   Date of last office visit: 11/20/23 [name], NP Date of next office visit: NONE   Request for Surgical Clearance    Procedure:  RIGHT TOTAL HIP REPLACEMENT  Date of Surgery:  Clearance 01/01/24                                Surgeon:  DR. JERRI  Surgeon's Group or Practice Name: [facility] Phone number:  [phone] Fax number:  [phone]   Type of Clearance Requested:   - Medical    Type of Anesthesia:  Spinal   Additional requests/questions:    [name]   11/20/2023, 10:19 AM

## 2023-11-20 NOTE — Progress Notes (Signed)
 Cardiology Office Note:    Date:  11/20/2023  ID:  Daniel Perkins, DOB [birth year], MRN [id number] PCP: Daniel Leni Edyth DELENA, MD  [facility] Providers Cardiologist:  Madonna Large, DO Cardiology APP:  Rana Lum CROME, NP { Click to update primary MD,subspecialty MD or APP then REFRESH:1}    {Click to Open Review  :1}   Patient Profile:       Chief Complaint: Preoperative cardiovascular clearance History of Present Illness:  Daniel Perkins is a 68 y.o. male with visit-pertinent history of type 2 diabetes, hyperlipidemia, HNT, nonobstructive CAD, statin intolerance  He had a nuclear stress test in 2018 that was low risk and echocardiogram completed in 2020 that showed LVEF of 60 to 65% with mild diastolic dysfunction and no valvular disease.  On 02/2021 he underwent cardiac CTA that showed an increased calcium  score of 67.9 and was unable to complete FFR due to increased heart rate.  He was last seen in clinic on 03/2022 by Jackee, NP.  He reported shortness of breath and a sensation of burning across his chest.  He had not been taking his carvedilol  in over 2 weeks and his blood pressures have been elevated in the 160s to 180s.  There were concerns about noncompliance with his current medication regimen.  He underwent cardiac PET/CT on 04/2022 that was a normal low risk study.  He is now pending right total hip replacement on 01/01/2024 with [facility]   Discussed the use of AI scribe software for clinical note transcription with the patient, who gave verbal consent to proceed.  History of Present Illness Daniel Perkins is a 68 year old male with diabetes and a history of myocardial infarction who presents for preoperative clearance for hip replacement surgery.  Today he tells me he is doing well overall.  He is without any chest pains or anginal symptoms.  He reports no chest pains since December 2023.  He experiences intermittent shortness of breath,  ongoing for years, occurring randomly, sometimes daily, and persisting for a few days before subsiding. There is no associated chest pain, leg pain, leg swelling, orthopnea, or paroxysmal nocturnal dyspnea.  He quit smoking 16.5 years ago. Due to hip pain, he does not engage in heavy physical activities but performs light household chores without exertional symptoms.  Review of systems:  Please see the history of present illness. All other systems are reviewed and otherwise negative.      Studies Reviewed:    EKG Interpretation Date/Time:  Monday November 20 2023 09:23:03 EDT Ventricular Rate:  85 PR Interval:  174 QRS Duration:  108 QT Interval:  370 QTC Calculation: 440 R Axis:   -48  Text Interpretation: Normal sinus rhythm Left anterior fascicular block Possible Lateral infarct , age undetermined Confirmed by Rana Lum [phone]) on 11/20/2023 9:47:54 AM    Cardiac PET/CT 05/03/2022   The study is normal. The study is low risk.   LV perfusion is normal.   Rest left ventricular function is normal. Rest EF: 69 %. Stress left ventricular function is normal. Stress EF: 73 %. End diastolic cavity size is normal. End systolic cavity size is normal.   Myocardial blood flow was computed to be 1.94ml/g/min at rest and 2.92ml/g/min at stress. Global myocardial blood flow reserve was 2.03 and was normal.   Coronary calcium  was present on the attenuation correction CT images. Moderate coronary calcifications were present. Coronary calcifications were present in the left anterior descending artery, left circumflex artery and right  coronary artery distribution(s).  CT cardiac scoring 02/22/2021 Coronary calcium  score of 67.9. This was 72nd percentile for age-, race-, and sex-matched controls.  Echocardiogram 07/05/2018 LVEF 60-65%, moderate LVH, normal wall motion, grade 1 DD,  indeterminate LV filling pressure, mild MR, trivial TR, trivial AI,  normal biatrial size, IVC not visualized    Coronary CTA 09/20/2017 1.  Calcium  score only 5 which is 58 th percentile for age and sex   2. Normal caliber ascending thoracic aorta 3.7 cm with atherosclerosis   3. Essentially normal right dominant coronary arteries see description above Risk Assessment/Calculations:              Physical Exam:   VS:  BP 120/70 (BP Location: Right Arm, Patient Position: Sitting, Cuff Size: Normal)   Pulse 85   Ht 5' 8 (1.727 m)   Wt 214 lb (97.1 kg)   BMI 32.54 kg/m    Wt Readings from Last 3 Encounters:  11/20/23 214 lb (97.1 kg)  03/18/22 206 lb (93.4 kg)  09/21/21 190 lb 11.2 oz (86.5 kg)    GEN: Well nourished, well developed in no acute distress NECK: No JVD; No carotid bruits CARDIAC: RRR, no murmurs, rubs, gallops RESPIRATORY:  Clear to auscultation without rales, wheezing or rhonchi  ABDOMEN: Soft, non-tender, non-distended EXTREMITIES:  No edema; No acute deformity      Assessment and Plan:  Coronary artery disease Coronary CTA 09/2017 with calcium  score of 5 (58th percentile) CT cardiac scoring 02/2021 coronary calcium  score 67.9 (72nd percentile) Cardiac PET/CT 04/2022 was normal low risk study - EKG today is without acute ischemic changes - Today patient is without anginal symptoms, no indication for further ischemic evaluation at this time - Plan to start aspirin  81 mg daily - Continue rosuvastatin  10 mg daily  Hyperlipidemia, LDL goal <70 His most recent LDL on file is 98 on 06/2017 Seems to be managed by PCP was recently but I do not have access to his PCPs labs at this time - Plan for direct LDL today and CMET today - Continue rosuvastatin  10 mg daily  Peripheral arterial disease He was formally followed by Dr. Darron for peripheral arterial disease but has not been seen since 03/2019 with normal ABI in the left and mildly reduced on the right at 0.78 due to occluded mid SFA in a short segment with one vessel runoff below the knee via the peroneal artery - He  has been treated medically giving his minimal symptoms  - Today he denies any claudication.  He has no lower extremity ulcers.  Activity is limited due to right hip pain - He remains without any symptoms.  I recommend continuing medical therapy and reducing risk factors  Hypertension Blood pressure today is well-controlled at 120/70 - Continue amlodipine  5 mg daily, carvedilol  3.125 mg twice daily, valsartan  160 mg daily  Type 2 diabetes A1c 8 on 06/2023 Currently on insulin  - Management per PCP  Mild mitral valve regurgitation Echocardiogram 06/2018 showed mild MR, trivial AI, and trivial TR - Plan for repeat echocardiogram for routine surveillance  Dyspnea on exertion Chronic and stable over the last several years.  No orthopnea, PND, weight gain, leg swelling. Cardiac PET/CT on 04/2022 did show emphysema - No current changes.  Will continue to clinically monitor.  He should follow with PCP due to emphysema  Preoperative cardiovascular risk assessment According to the Revised Cardiac Risk Index (RCRI), his Perioperative Risk of Major Cardiac Event is (%): 0.9. His Functional Capacity in  METs is: 4.4 according to the Duke Activity Status Index (DASI). Therefore, based on ACC/AHA guidelines, patient would be at acceptable risk for the planned procedure without further cardiovascular testing. I will route this recommendation to the requesting party via Epic fax function.  He may hold aspirin  for 5-7 days prior to procedure. Please resume aspirin  as soon as possible postprocedure, at the discretion of the surgeon.        Dispo:  Return in about 6 months (around 05/22/2024).  He is a former Dr. Dann patient. Will have patient establish with Dr. Michele.   Signed, Lum LITTIE Louis, NP

## 2023-11-20 NOTE — Patient Instructions (Addendum)
 Medication Instructions:  NO CHANGES   Lab Work: CMET AND DIRECT LDL TO BE DONE WHEN FASTING.   Testing/Procedures: Your physician has requested that you have an echocardiogram. Echocardiography is a painless test that uses sound waves to create images of your heart. It provides your doctor with information about the size and shape of your heart and how well your heart's chambers and valves are working. This procedure takes approximately one hour. There are no restrictions for this procedure. Please do NOT wear cologne, perfume, aftershave, or lotions (deodorant is allowed). Please arrive 15 minutes prior to your appointment time.  Please note: We ask at that you not bring children with you during ultrasound (echo/ vascular) testing. Due to room size and safety concerns, children are not allowed in the ultrasound rooms during exams. Our front office staff cannot provide observation of children in our lobby area while testing is being conducted. An adult accompanying a patient to their appointment will only be allowed in the ultrasound room at the discretion of the ultrasound technician under special circumstances. We apologize for any inconvenience.   Follow-Up: At [facility], you and your health needs are our priority.  As part of our continuing mission to provide you with exceptional heart care, our providers are all part of one team.  This team includes your primary Cardiologist (physician) and Advanced Practice Providers or APPs (Physician Assistants and Nurse Practitioners) who all work together to provide you with the care you need, when you need it.  Your next appointment:   6 MONTHS  Provider:   [name], DNP OR DR. MICHELE, MD (WILL BE NEW PATIENT FOR DR. MICHELE)  We recommend signing up for the patient portal called MyChart.  Sign up information is provided on this After Visit Summary.  MyChart is used to connect with patients for Virtual Visits (Telemedicine).   Patients are able to view lab/test results, encounter notes, upcoming appointments, etc.  Non-urgent messages can be sent to your provider as well.   To learn more about what you can do with MyChart, go to [email or web address].

## 2023-11-21 ENCOUNTER — Encounter: Payer: Self-pay | Admitting: Emergency Medicine

## 2023-11-24 NOTE — Telephone Encounter (Signed)
 Does he have record of a new A1c? I don't see it in epic.

## 2023-11-28 LAB — COMPREHENSIVE METABOLIC PANEL WITH GFR
ALT: 24 IU/L (ref 0–44)
AST: 32 IU/L (ref 0–40)
Albumin: 3.9 g/dL (ref 3.9–4.9)
Alkaline Phosphatase: 135 IU/L — ABNORMAL HIGH (ref 44–121)
BUN/Creatinine Ratio: 15 (ref 10–24)
BUN: 13 mg/dL (ref 8–27)
Bilirubin Total: 0.3 mg/dL (ref 0.0–1.2)
CO2: 20 mmol/L (ref 20–29)
Calcium: 9.2 mg/dL (ref 8.6–10.2)
Chloride: 100 mmol/L (ref 96–106)
Creatinine, Ser: 0.86 mg/dL (ref 0.76–1.27)
Globulin, Total: 3 g/dL (ref 1.5–4.5)
Glucose: 146 mg/dL — ABNORMAL HIGH (ref 70–99)
Potassium: 4.1 mmol/L (ref 3.5–5.2)
Sodium: 133 mmol/L — ABNORMAL LOW (ref 134–144)
Total Protein: 6.9 g/dL (ref 6.0–8.5)
eGFR: 95 mL/min/1.73 (ref >59)

## 2023-11-28 LAB — LDL CHOLESTEROL, DIRECT: LDL Direct: 64 mg/dL (ref 0–99)

## 2023-11-30 ENCOUNTER — Ambulatory Visit: Payer: Self-pay | Admitting: Emergency Medicine

## 2023-12-14 ENCOUNTER — Other Ambulatory Visit: Payer: Self-pay | Admitting: Physician Assistant

## 2023-12-26 ENCOUNTER — Ambulatory Visit (HOSPITAL_COMMUNITY)
Admission: RE | Admit: 2023-12-26 | Discharge: 2023-12-26 | Disposition: A | Discharge status: Home / Self Care | Payer: Medicare (Managed Care) | Source: Ambulatory Visit | Attending: Internal Medicine | Admitting: Internal Medicine

## 2023-12-26 DIAGNOSIS — I10 Essential (primary) hypertension: Secondary | ICD-10-CM | POA: Insufficient documentation

## 2023-12-26 DIAGNOSIS — I340 Nonrheumatic mitral (valve) insufficiency: Secondary | ICD-10-CM | POA: Diagnosis not present

## 2023-12-26 LAB — ECHOCARDIOGRAM COMPLETE
AR max vel: 2.69 cm2
AV Area VTI: 2.58 cm2
AV Area mean vel: 2.65 cm2
AV Mean grad: 8 mmHg
AV Peak grad: 14.7 mmHg
Ao pk vel: 1.92 m/s
Area-P 1/2: 3.68 cm2
MV M vel: 5.06 m/s
MV Peak grad: 102.4 mmHg
P 1/2 time: 493 ms
S' Lateral: 2.1 cm

## 2023-12-26 NOTE — Pre-Procedure Instructions (Signed)
 Surgical Instructions   Your procedure is scheduled on January 01, 2024. Report to [facility] Main Entrance A at 10:30 A.M., then check in with the Admitting office. Any questions or running late day of surgery: call [phone]  Questions prior to your surgery date: call [phone], Monday-Friday, 8am-4pm. If you experience any cold or flu symptoms such as cough, fever, chills, shortness of breath, etc. between now and your scheduled surgery, please notify us  at the above number.     Remember:  Do not eat after midnight the night before your surgery  You may drink clear liquids until 10:00 AM the morning of your surgery.   Clear liquids allowed are: Water, Non-Citrus Juices (without pulp), Carbonated Beverages, Clear Tea (no milk, honey, etc.), Black Coffee Only (NO MILK, CREAM OR POWDERED CREAMER of any kind), and Gatorade.  Patient Instructions  The night before surgery:  No food after midnight. ONLY clear liquids after midnight  The day of surgery (if you have diabetes): Drink ONE (1) 12 oz G2 by 10:00 AM the morning of surgery. Drink in one sitting. Do not sip.  This drink was given to you during your hospital  pre-op appointment visit.  Nothing else to drink after completing the  12 oz bottle of G2.         If you have questions, please contact your surgeon's office.    Take these medicines the morning of surgery with A SIP OF WATER: acetaminophen  (TYLENOL )  allopurinol  (ZYLOPRIM )  amLODipine  (NORVASC )  carvedilol  (COREG )  colchicine   gabapentin  (NEURONTIN )  rosuvastatin  (CRESTOR )    May take these medicines IF NEEDED: nitroGLYCERIN  (NITROSTAT ) - if dose taken prior to surgery, please call [phone]   STOP taking your Aspirin  starting today, August 20th, unless otherwise instructed by surgeon's office.    One week prior to surgery, STOP taking any Aleve , Naproxen , Ibuprofen , Motrin , Advil , Goody's, BC's, all herbal medications, fish oil , and  non-prescription vitamins. This includes your medication: diclofenac  Sodium (VOLTAREN ) GEL    WHAT DO I DO ABOUT MY DIABETES MEDICATION?   THE NIGHT BEFORE SURGERY, take 7 units of insulin  glargine (LANTUS ).   HOW TO MANAGE YOUR DIABETES BEFORE AND AFTER SURGERY  Why is it important to control my blood sugar before and after surgery? Improving blood sugar levels before and after surgery helps healing and can limit problems. A way of improving blood sugar control is eating a healthy diet by:  Eating less sugar and carbohydrates  Increasing activity/exercise  Talking with your doctor about reaching your blood sugar goals High blood sugars (greater than 180 mg/dL) can raise your risk of infections and slow your recovery, so you will need to focus on controlling your diabetes during the weeks before surgery. Make sure that the doctor who takes care of your diabetes knows about your planned surgery including the date and location.  How do I manage my blood sugar before surgery? Check your blood sugar at least 4 times a day, starting 2 days before surgery, to make sure that the level is not too high or low.  Check your blood sugar the morning of your surgery when you wake up and every 2 hours until you get to the Short Stay unit.  If your blood sugar is less than 70 mg/dL, you will need to treat for low blood sugar: Do not take insulin . Treat a low blood sugar (less than 70 mg/dL) with  cup of clear juice (cranberry or apple), 4 glucose tablets, OR glucose gel.  Recheck blood sugar in 15 minutes after treatment (to make sure it is greater than 70 mg/dL). If your blood sugar is not greater than 70 mg/dL on recheck, call [phone] for further instructions. Report your blood sugar to the short stay nurse when you get to Short Stay.  If you are admitted to the hospital after surgery: Your blood sugar will be checked by the staff and you will probably be given insulin  after surgery (instead  of oral diabetes medicines) to make sure you have good blood sugar levels. The goal for blood sugar control after surgery is 80-180 mg/dL.                      Do NOT Smoke (Tobacco/Vaping) for 24 hours prior to your procedure.  If you use a CPAP at night, you may bring your mask/headgear for your overnight stay.   You will be asked to remove any contacts, glasses, piercing's, hearing aid's, dentures/partials prior to surgery. Please bring cases for these items if needed.    Patients discharged the day of surgery will not be allowed to drive home, and someone needs to stay with them for 24 hours.  SURGICAL WAITING ROOM VISITATION Patients may have no more than 2 support people in the waiting area - these visitors may rotate.   Pre-op nurse will coordinate an appropriate time for 1 ADULT support person, who may not rotate, to accompany patient in pre-op.  Children under the age of 31 must have an adult with them who is not the patient and must remain in the main waiting area with an adult.  If the patient needs to stay at the hospital during part of their recovery, the visitor guidelines for inpatient rooms apply.  Please refer to the [facility] website for the visitor guidelines for any additional information.   If you received a COVID test during your pre-op visit  it is requested that you wear a mask when out in public, stay away from anyone that may not be feeling well and notify your surgeon if you develop symptoms. If you have been in contact with anyone that has tested positive in the last 10 days please notify you surgeon.      Pre-operative 5 CHG Bathing Instructions   You can play a key role in reducing the risk of infection after surgery. Your skin needs to be as free of germs as possible. You can reduce the number of germs on your skin by washing with CHG (chlorhexidine  gluconate) soap before surgery. CHG is an antiseptic soap that kills germs and continues to kill germs  even after washing.   DO NOT use if you have an allergy to chlorhexidine /CHG or antibacterial soaps. If your skin becomes reddened or irritated, stop using the CHG and notify one of our RNs at [phone].   Please shower with the CHG soap starting 4 days before surgery using the following schedule:     Please keep in mind the following:  DO NOT shave, including legs and underarms, starting the day of your first shower.   You may shave your face at any point before/day of surgery.  Place clean sheets on your bed the day you start using CHG soap. Use a clean washcloth (not used since being washed) for each shower. DO NOT sleep with pets once you start using the CHG.   CHG Shower Instructions:  Wash your face and private area with normal soap. If you choose to wash  your hair, wash first with your normal shampoo.  After you use shampoo/soap, rinse your hair and body thoroughly to remove shampoo/soap residue.  Turn the water OFF and apply about 3 tablespoons (45 ml) of CHG soap to a CLEAN washcloth.  Apply CHG soap ONLY FROM YOUR NECK DOWN TO YOUR TOES (washing for 3-5 minutes)  DO NOT use CHG soap on face, private areas, open wounds, or sores.  Pay special attention to the area where your surgery is being performed.  If you are having back surgery, having someone wash your back for you may be helpful. Wait 2 minutes after CHG soap is applied, then you may rinse off the CHG soap.  Pat dry with a clean towel  Put on clean clothes/pajamas   If you choose to wear lotion, please use ONLY the CHG-compatible lotions that are listed below.  Additional instructions for the day of surgery: DO NOT APPLY any lotions, deodorants, cologne, or perfumes.   Do not bring valuables to the hospital. [facility] is not responsible for any belongings/valuables. Do not wear nail polish, gel polish, artificial nails, or any other type of covering on natural nails (fingers and toes) Do not wear jewelry or  makeup Put on clean/comfortable clothes.  Please brush your teeth.  Ask your nurse before applying any prescription medications to the skin.     CHG Compatible Lotions   Aveeno Moisturizing lotion  Cetaphil Moisturizing Cream  Cetaphil Moisturizing Lotion  Clairol Herbal Essence Moisturizing Lotion, Dry Skin  Clairol Herbal Essence Moisturizing Lotion, Extra Dry Skin  Clairol Herbal Essence Moisturizing Lotion, Normal Skin  Curel Age Defying Therapeutic Moisturizing Lotion with Alpha Hydroxy  Curel Extreme Care Body Lotion  Curel Soothing Hands Moisturizing Hand Lotion  Curel Therapeutic Moisturizing Cream, Fragrance-Free  Curel Therapeutic Moisturizing Lotion, Fragrance-Free  Curel Therapeutic Moisturizing Lotion, Original Formula  Eucerin Daily Replenishing Lotion  Eucerin Dry Skin Therapy Plus Alpha Hydroxy Crme  Eucerin Dry Skin Therapy Plus Alpha Hydroxy Lotion  Eucerin Original Crme  Eucerin Original Lotion  Eucerin Plus Crme Eucerin Plus Lotion  Eucerin TriLipid Replenishing Lotion  Keri Anti-Bacterial Hand Lotion  Keri Deep Conditioning Original Lotion Dry Skin Formula Softly Scented  Keri Deep Conditioning Original Lotion, Fragrance Free Sensitive Skin Formula  Keri Lotion Fast Absorbing Fragrance Free Sensitive Skin Formula  Keri Lotion Fast Absorbing Softly Scented Dry Skin Formula  Keri Original Lotion  Keri Skin Renewal Lotion Keri Silky Smooth Lotion  Keri Silky Smooth Sensitive Skin Lotion  Nivea Body Creamy Conditioning Oil  Nivea Body Extra Enriched Lotion  Nivea Body Original Lotion  Nivea Body Sheer Moisturizing Lotion Nivea Crme  Nivea Skin Firming Lotion  NutraDerm 30 Skin Lotion  NutraDerm Skin Lotion  NutraDerm Therapeutic Skin Cream  NutraDerm Therapeutic Skin Lotion  ProShield Protective Hand Cream  Provon moisturizing lotion  Please read over the following fact sheets that you were given.

## 2023-12-27 ENCOUNTER — Other Ambulatory Visit: Payer: Self-pay

## 2023-12-27 ENCOUNTER — Encounter (HOSPITAL_COMMUNITY): Payer: Self-pay

## 2023-12-27 ENCOUNTER — Encounter (HOSPITAL_COMMUNITY)
Admission: RE | Admit: 2023-12-27 | Discharge: 2023-12-27 | Disposition: A | Discharge status: Home / Self Care | Payer: Medicare (Managed Care) | Source: Ambulatory Visit | Attending: Orthopaedic Surgery | Admitting: Orthopaedic Surgery

## 2023-12-27 VITALS — BP 156/93 | HR 82 | Temp 97.8°F | Resp 17

## 2023-12-27 DIAGNOSIS — E785 Hyperlipidemia, unspecified: Secondary | ICD-10-CM | POA: Insufficient documentation

## 2023-12-27 DIAGNOSIS — Z87891 Personal history of nicotine dependence: Secondary | ICD-10-CM | POA: Insufficient documentation

## 2023-12-27 DIAGNOSIS — I2510 Atherosclerotic heart disease of native coronary artery without angina pectoris: Secondary | ICD-10-CM | POA: Diagnosis not present

## 2023-12-27 DIAGNOSIS — M1611 Unilateral primary osteoarthritis, right hip: Secondary | ICD-10-CM | POA: Insufficient documentation

## 2023-12-27 DIAGNOSIS — Z794 Long term (current) use of insulin: Secondary | ICD-10-CM | POA: Insufficient documentation

## 2023-12-27 DIAGNOSIS — Z01818 Encounter for other preprocedural examination: Secondary | ICD-10-CM

## 2023-12-27 DIAGNOSIS — Q6602 Congenital talipes equinovarus, left foot: Secondary | ICD-10-CM | POA: Diagnosis not present

## 2023-12-27 DIAGNOSIS — Q6601 Congenital talipes equinovarus, right foot: Secondary | ICD-10-CM | POA: Insufficient documentation

## 2023-12-27 DIAGNOSIS — E119 Type 2 diabetes mellitus without complications: Secondary | ICD-10-CM | POA: Diagnosis not present

## 2023-12-27 DIAGNOSIS — Z01812 Encounter for preprocedural laboratory examination: Secondary | ICD-10-CM | POA: Insufficient documentation

## 2023-12-27 DIAGNOSIS — I10 Essential (primary) hypertension: Secondary | ICD-10-CM | POA: Insufficient documentation

## 2023-12-27 HISTORY — DX: Unspecified osteoarthritis, unspecified site: M19.90

## 2023-12-27 LAB — TYPE AND SCREEN
ABO/RH(D): O POS
Antibody Screen: NEGATIVE

## 2023-12-27 LAB — CBC
HCT: 37 % — ABNORMAL LOW (ref 39.0–52.0)
Hemoglobin: 11.3 g/dL — ABNORMAL LOW (ref 13.0–17.0)
MCH: 22.6 pg — ABNORMAL LOW (ref 26.0–34.0)
MCHC: 30.5 g/dL (ref 30.0–36.0)
MCV: 74 fL — ABNORMAL LOW (ref 80.0–100.0)
Platelets: 294 K/uL (ref 150–400)
RBC: 5 MIL/uL (ref 4.22–5.81)
RDW: 15 % (ref 11.5–15.5)
WBC: 6.7 K/uL (ref 4.0–10.5)
nRBC: 0 % (ref 0.0–0.2)

## 2023-12-27 LAB — BASIC METABOLIC PANEL WITH GFR
Anion gap: 13 (ref 5–15)
BUN: 13 mg/dL (ref 8–23)
CO2: 23 mmol/L (ref 22–32)
Calcium: 9.6 mg/dL (ref 8.9–10.3)
Chloride: 103 mmol/L (ref 98–111)
Creatinine, Ser: 0.95 mg/dL (ref 0.61–1.24)
GFR, Estimated: >60 mL/min (ref >60)
Glucose, Bld: 161 mg/dL — ABNORMAL HIGH (ref 70–99)
Potassium: 4 mmol/L (ref 3.5–5.1)
Sodium: 139 mmol/L (ref 135–145)

## 2023-12-27 LAB — HEMOGLOBIN A1C
Hgb A1c MFr Bld: 7.4 % — ABNORMAL HIGH (ref 4.8–5.6)
Mean Plasma Glucose: 165.68 mg/dL

## 2023-12-27 LAB — SURGICAL PCR SCREEN
MRSA, PCR: NEGATIVE
Staphylococcus aureus: POSITIVE — AB

## 2023-12-27 LAB — GLUCOSE, CAPILLARY: Glucose-Capillary: 153 mg/dL — ABNORMAL HIGH (ref 70–99)

## 2023-12-27 NOTE — Progress Notes (Addendum)
 PCP - Leni Edyth Fairly, MD Cardiologist - Madonna Large, DO - last office visit 11/20/2023  PPM/ICD - Denies Device Orders - n/a Rep Notified - n/a  Chest x-ray - n/a EKG - 11/20/2023 Stress Test - 04/26/2017 ECHO - 12/26/2023 Cardiac Cath - 07/2009 - no intervention  Sleep Study - Denies CPAP - n/a  Pt is DM2. He no longer has a glucose meter at home, but when he did, his normal fasting range was 120s. CBG at pre-op 153. A1c result pending.  Last dose of GLP1 agonist- n/a GLP1 instructions: n/a  Blood Thinner Instructions: n/a Aspirin  Instructions: Pt had not received instructions from surgeon's office regarding stopping ASA. RN attempted to call office to get them, but no answer. Voicemail left with office to call pt with instructions. Pt took his ASA today, 8/20, and RN instructed pt to continue to hold medication unless otherwise indicated by office. Pt understood instructions.  ERAS Protcol - Clear liquids until 1000 morning of surgery PRE-SURGERY Ensure or G2- G2 given to pt with instructions  COVID TEST- n/a   Anesthesia review: Yes. Cardiac clearance.   Patient denies shortness of breath, fever, cough and chest pain at PAT appointment. Pt denies any respiratory illness/infection in the last two months.   All instructions explained to the patient, with a verbal understanding of the material. Patient agrees to go over the instructions while at home for a better understanding. Patient also instructed to self quarantine after being tested for COVID-19. The opportunity to ask questions was provided.

## 2023-12-28 NOTE — Progress Notes (Signed)
 Anesthesia Chart Review:  Case: [id number] Date/Time: 01/01/24 1245   Procedure: ARTHROPLASTY, HIP, TOTAL, ANTERIOR APPROACH (Right: Hip) - 3-C   Anesthesia type: Spinal   Diagnosis: Osteoarthritis of right hip, unspecified osteoarthritis type [M16.11]   Pre-op diagnosis: RIGHT HIP OSTEOARTHRITIS   [facility] 09 / [facility]   Surgeons: Jerri Kay HERO, MD       DISCUSSION: Patient is a 68 year old male scheduled for the above procedure.   History includes former smoker (quit 07/06/07), CAD, HTN, HLD, DM2, congenital talpies equinovarus (clubfeet), SBO (s/p laparoscopy, LOA, mini-laparotomy for primary repair 2 mm small bowel perforation 03/05/21), spinal surgery (C4-7 ACD, C4-5 posterior hardware on xray), nasal fracture (s/p closed reduction 09/21/21).   He is followed by cardiologist Dr. Michele. On 02/22/21 CAC 67.9 (76th percentile). Cardiac PET/CT in 04/2022 (for dyspnea, chest burning) was normal. Still with intermittent SOB. No chest pain. Mild MR by TTE in 2020. Updated echo on 12/26/23 showing LVEF 65-70%, no RWMA, normal RV systolic function, mild MR, mild AR.     Preoperative cardiology input outlined on 11/20/2023 by Rana Dixon, NP: According to the Revised Cardiac Risk Index (RCRI), his Perioperative Risk of Major Cardiac Event is (%): 0.9. His Functional Capacity in METs is: 4.4 according to the Duke Activity Status Index (DASI). Therefore, based on ACC/AHA guidelines, patient would be at acceptable risk for the planned procedure without further cardiovascular testing... He may hold aspirin  for 5-7 days prior to procedure. Please resume aspirin  as soon as possible postprocedure, at the discretion of the surgeon.  Last ASA 12/27/23, unless otherwise instructed by surgeon.   A1c 7.4% on 12/28/23. Lantus  15 units daily.   Anesthesia team to evaluate on the day of surgery.    VS: BP (!) 156/93   Pulse 82   Temp 36.6 C   Resp 17   SpO2 95%    PROVIDERS: Maree Leni Edyth DELENA, MD is PCP  Michele Richardson, DO is cardiologist   LABS: Labs reviewed: Acceptable for surgery. Alk phos 135, AST 32, ALT 24, total bili 0.3 on 11/27/2023. (all labs ordered are listed, but only abnormal results are displayed)  Labs Reviewed  SURGICAL PCR SCREEN - Abnormal; Notable for the following components:      Result Value   Staphylococcus aureus POSITIVE (*)    All other components within normal limits  GLUCOSE, CAPILLARY - Abnormal; Notable for the following components:   Glucose-Capillary 153 (*)    All other components within normal limits  CBC - Abnormal; Notable for the following components:   Hemoglobin 11.3 (*)    HCT 37.0 (*)    MCV 74.0 (*)    MCH 22.6 (*)    All other components within normal limits  BASIC METABOLIC PANEL WITH GFR - Abnormal; Notable for the following components:   Glucose, Bld 161 (*)    All other components within normal limits  HEMOGLOBIN A1C - Abnormal; Notable for the following components:   Hgb A1c MFr Bld 7.4 (*)    All other components within normal limits  TYPE AND SCREEN     IMAGES: Xray right hip 06/27/2023: X-rays of the right hip shows severe degenerative changes with  periarticular spurring and cystic changes.  Bone-on-bone joint space  narrowing.   CT Right hip 04/18/2023: IMPRESSION: 1. Moderate right hip osteoarthritis, mildly progressive from previous pelvic CT. 2. No acute osseous findings or significant hip joint effusions. 3. Lower lumbar spondylosis.   Xray L-spine  11/08/2022: FINDINGS: There is no evidence for fracture. Spinal alignment is normal. There is mild disc space narrowing and endplate osteophyte formation throughout the lumbar spine most significant at L3-L4 and L4-L5. Findings are similar to the prior study. There are atherosclerotic calcifications of the aorta. IMPRESSION: Mild degenerative changes throughout the lumbar spine, similar to the prior study.   EKG: 11/20/2023: Normal sinus rhythm Left  anterior fascicular block Possible Lateral infarct , age undetermined Confirmed by Rana Dixon [phone]) on 11/20/2023 9:47:54 AM   CV: Echo 12/26/2023: IMPRESSIONS   1. Left ventricular ejection fraction, by estimation, is 65 to 70%. Left  ventricular ejection fraction by 3D volume is 68 %. The left ventricle has  normal function. The left ventricle has no regional wall motion  abnormalities. Left ventricular diastolic   parameters are indeterminate.   2. Right ventricular systolic function is normal. The right ventricular  size is normal.   3. The mitral valve is normal in structure. Mild mitral valve  regurgitation. No evidence of mitral stenosis.   4. The aortic valve is calcified. There is moderate calcification of the  aortic valve. There is moderate thickening of the aortic valve. Aortic  valve regurgitation is mild. Aortic valve sclerosis/calcification is  present, without any evidence of aortic  stenosis. Aortic regurgitation PHT measures 493 msec. Aortic valve area,  by VTI measures 2.58 cm. Aortic valve mean gradient measures 8.0 mmHg.  Aortic valve Vmax measures 1.92 m/s.   5. The inferior vena cava is normal in size with greater than 50%  respiratory variability, suggesting right atrial pressure of 3 mmHg.    NM PET CT Cardiac Perfusion Study 05/03/2022:   The study is normal. The study is low risk.   LV perfusion is normal.   Rest left ventricular function is normal. Rest EF: 69 %. Stress left ventricular function is normal. Stress EF: 73 %. End diastolic cavity size is normal. End systolic cavity size is normal.   Myocardial blood flow was computed to be 1.62ml/g/min at rest and 2.92ml/g/min at stress. Global myocardial blood flow reserve was 2.03 and was normal.   Coronary calcium  was present on the attenuation correction CT images. Moderate coronary calcifications were present. Coronary calcifications were present in the left anterior descending artery, left  circumflex artery and right coronary artery distribution(s).   CT Cardiac Coronary Calcium  Scoring 02/22/2021:  IMPRESSION: Coronary calcium  score of 67.9. This was 72nd percentile for age-, race-, and sex-matched controls.   CTA Coronary 09/20/2017: - Calcium  Score: Minidmal punctate calcium  noted in proximal LAD and mid circumflex - Coronary Arteries: Right dominant with no anomalies LM: Normal LAD: Less than 30% proximal calcific disease D1: Normal D2: Normal D3: Normal Circumflex: Less than 30% calcific mid vessel disease OM1: Normal OM2: Normal RCA: Normal PDA: Normal PLA: Normal   IMPRESSION: 1.  Calcium  score only 5 which is 58 th percentile for age and sex 2. Normal caliber ascending thoracic aorta 3.7 cm with atherosclerosis 3. Essentially normal right dominant coronary arteries see description above     Past Medical History:  Diagnosis Date   Arthritis    Club foot of both lower extremities 1958   Coronary artery disease    Holter monitor 02/2012 - sinus with rare PVC   CTEV (congenital talipes equinovarus)    Gout    Hyperlipidemia    Hypertension    Myocardial infarction [facility]) ~ 2007   mild   Osteomyelitis (HCC)    Pneumonia 2000's   couple  times   Pneumonia due to COVID-19 virus 04/28/2019   Type II diabetes mellitus (HCC) 2011    Past Surgical History:  Procedure Laterality Date   ANTERIOR CERVICAL DECOMP/DISCECTOMY FUSION  X 2   CARDIAC CATHETERIZATION  07/2009   CLOSED REDUCTION NASAL FRACTURE     when 68 years old   CLOSED REDUCTION NASAL FRACTURE Bilateral 09/21/2021   Procedure: CLOSED REDUCTION NASAL FRACTURE;  Surgeon: Carlie Clark, MD;  Location: [facility];  Service: ENT;  Laterality: Bilateral;   FOOT FRACTURE SURGERY Right 01/07/1989   pt a steel plate in   FOOT SURGERY Left x16   joints collapsed; keep getting infected   LAPAROSCOPY N/A 03/05/2021   Procedure: LAPAROSCOPY DIAGNOSTIC;  Surgeon: Signe Mitzie LABOR, MD;  [facility];  Service: General;  Laterality: N/A;   LAPAROTOMY Right 03/05/2021   Procedure: EXPLORATORY LAPAROTOMY, REPAIR SMALL BOWEL PERFORATION;  Surgeon: Signe Mitzie LABOR, MD;  [facility]: [facility];  Service: General;  Laterality: Right;   LYSIS OF ADHESION  03/05/2021   Procedure: LYSIS OF ADHESION;  Surgeon: Signe Mitzie LABOR, MD;  Location: [facility];  Service: General;;   POSTERIOR CERVICAL FUSION/FORAMINOTOMY  X 2    MEDICATIONS:  Accu-Chek Softclix Lancets lancets   acetaminophen  (TYLENOL ) 325 MG tablet   allopurinol  (ZYLOPRIM ) 100 MG tablet   amLODipine  (NORVASC ) 10 MG tablet   aspirin  EC 81 MG tablet   benzonatate  (TESSALON ) 100 MG capsule   carvedilol  (COREG ) 6.25 MG tablet   colchicine  0.6 MG tablet   cyclobenzaprine  (FLEXERIL ) 5 MG tablet   diclofenac  Sodium (VOLTAREN ) 1 % GEL   gabapentin  (NEURONTIN ) 100 MG capsule   glucose blood (ACCU-CHEK GUIDE) test strip   insulin  glargine (LANTUS ) 100 UNIT/ML Solostar Pen   Insulin  Pen Needle 32G X 4 MM MISC   loperamide  (IMODIUM ) 2 MG capsule   nitroGLYCERIN  (NITROSTAT ) 0.4 MG SL tablet   oxyCODONE -acetaminophen  (PERCOCET) 10-325 MG tablet   rosuvastatin  (CRESTOR ) 10 MG tablet   valsartan  (DIOVAN ) 160 MG tablet   No current facility-administered medications for this encounter.    Isaiah Ruder, PA-C Surgical Short Stay/Anesthesiology [facility] Phone [phone] [facility] Phone [phone] 12/28/2023 3:33 PM

## 2023-12-28 NOTE — Anesthesia Preprocedure Evaluation (Addendum)
 Anesthesia Evaluation  Patient identified by MRN, date of birth, ID band Patient awake    Reviewed: Allergy & Precautions, H&P , NPO status , Patient's Chart, lab work & pertinent test results  Airway Mallampati: II   Neck ROM: full    Dental   Pulmonary former smoker   breath sounds clear to auscultation       Cardiovascular hypertension, + CAD, + Past MI and + Peripheral Vascular Disease   Rhythm:regular Rate:Normal     Neuro/Psych    GI/Hepatic ,GERD  ,,  Endo/Other  diabetes, Type 2    Renal/GU      Musculoskeletal  (+) Arthritis ,    Abdominal   Peds  Hematology   Anesthesia Other Findings   Reproductive/Obstetrics                              Anesthesia Physical Anesthesia Plan  ASA: 3  Anesthesia Plan: MAC and Spinal   Post-op Pain Management:    Induction: Intravenous  PONV Risk Score and Plan: 1 and Propofol  infusion, Midazolam  and Treatment may vary due to age or medical condition  Airway Management Planned: Simple Face Mask  Additional Equipment:   Intra-op Plan:   Post-operative Plan:   Informed Consent: I have reviewed the patients History and Physical, chart, labs and discussed the procedure including the risks, benefits and alternatives for the proposed anesthesia with the patient or authorized representative who has indicated his/her understanding and acceptance.     Dental advisory given  Plan Discussed with: CRNA, Anesthesiologist and Surgeon  Anesthesia Plan Comments: (PAT note written 12/28/2023 by Allison Zelenak, PA-C.  )         Anesthesia Quick Evaluation

## 2023-12-29 ENCOUNTER — Other Ambulatory Visit: Payer: Self-pay | Admitting: Physician Assistant

## 2023-12-29 MED ORDER — METHOCARBAMOL 750 MG PO TABS: 750.0000 mg | ORAL_TABLET | Freq: Three times a day (TID) | ORAL | 2 refills | Status: AC | PRN

## 2023-12-29 MED ORDER — DOCUSATE SODIUM 100 MG PO CAPS
100.0000 mg | ORAL_CAPSULE | Freq: Every day | ORAL | 2 refills | Status: AC | PRN
Start: 2023-12-29 — End: 2024-12-28

## 2023-12-29 MED ORDER — DOXYCYCLINE HYCLATE 100 MG PO CAPS
100.0000 mg | ORAL_CAPSULE | Freq: Two times a day (BID) | ORAL | 0 refills | Status: AC
Start: 2023-12-29 — End: ?

## 2023-12-29 MED ORDER — ONDANSETRON HCL 4 MG PO TABS: 4.0000 mg | ORAL_TABLET | Freq: Three times a day (TID) | ORAL | 0 refills | Status: AC | PRN

## 2023-12-31 DIAGNOSIS — M1611 Unilateral primary osteoarthritis, right hip: Secondary | ICD-10-CM | POA: Insufficient documentation

## 2024-01-01 ENCOUNTER — Other Ambulatory Visit (HOSPITAL_COMMUNITY): Payer: Self-pay

## 2024-01-01 ENCOUNTER — Other Ambulatory Visit: Payer: Self-pay | Admitting: Physician Assistant

## 2024-01-01 ENCOUNTER — Other Ambulatory Visit: Payer: Self-pay

## 2024-01-01 ENCOUNTER — Ambulatory Visit (HOSPITAL_COMMUNITY): Payer: Medicare (Managed Care)

## 2024-01-01 ENCOUNTER — Observation Stay (HOSPITAL_COMMUNITY): Payer: Medicare (Managed Care)

## 2024-01-01 ENCOUNTER — Observation Stay (HOSPITAL_COMMUNITY)
Admission: RE | Admit: 2024-01-01 | Discharge: 2024-01-03 | Disposition: A | Discharge status: Home / Self Care | Payer: Medicare (Managed Care) | Attending: Orthopaedic Surgery | Admitting: Orthopaedic Surgery

## 2024-01-01 ENCOUNTER — Encounter (HOSPITAL_COMMUNITY): Payer: Self-pay | Admitting: Orthopaedic Surgery

## 2024-01-01 ENCOUNTER — Ambulatory Visit (HOSPITAL_COMMUNITY): Payer: Medicare (Managed Care) | Admitting: Certified Registered Nurse Anesthetist

## 2024-01-01 ENCOUNTER — Ambulatory Visit (HOSPITAL_COMMUNITY): Payer: Medicare (Managed Care) | Admitting: Vascular Surgery

## 2024-01-01 ENCOUNTER — Encounter (HOSPITAL_COMMUNITY)
Admission: RE | Disposition: A | Discharge status: Home / Self Care | Payer: Self-pay | Source: Home / Self Care | Attending: Orthopaedic Surgery

## 2024-01-01 DIAGNOSIS — I2510 Atherosclerotic heart disease of native coronary artery without angina pectoris: Secondary | ICD-10-CM

## 2024-01-01 DIAGNOSIS — Z96641 Presence of right artificial hip joint: Secondary | ICD-10-CM

## 2024-01-01 DIAGNOSIS — Z79899 Other long term (current) drug therapy: Secondary | ICD-10-CM | POA: Diagnosis not present

## 2024-01-01 DIAGNOSIS — I10 Essential (primary) hypertension: Secondary | ICD-10-CM

## 2024-01-01 DIAGNOSIS — E119 Type 2 diabetes mellitus without complications: Secondary | ICD-10-CM | POA: Diagnosis not present

## 2024-01-01 DIAGNOSIS — M1611 Unilateral primary osteoarthritis, right hip: Principal | ICD-10-CM | POA: Insufficient documentation

## 2024-01-01 DIAGNOSIS — Z794 Long term (current) use of insulin: Secondary | ICD-10-CM | POA: Diagnosis not present

## 2024-01-01 DIAGNOSIS — Z87891 Personal history of nicotine dependence: Secondary | ICD-10-CM | POA: Diagnosis not present

## 2024-01-01 DIAGNOSIS — M25551 Pain in right hip: Secondary | ICD-10-CM | POA: Diagnosis present

## 2024-01-01 HISTORY — PX: TOTAL HIP ARTHROPLASTY: SHX124

## 2024-01-01 LAB — GLUCOSE, CAPILLARY
Glucose-Capillary: 134 mg/dL — ABNORMAL HIGH (ref 70–99)
Glucose-Capillary: 135 mg/dL — ABNORMAL HIGH (ref 70–99)
Glucose-Capillary: 118 mg/dL — ABNORMAL HIGH (ref 70–99)

## 2024-01-01 SURGERY — ARTHROPLASTY, HIP, TOTAL, ANTERIOR APPROACH
Anesthesia: Monitor Anesthesia Care | Site: Hip | Laterality: Right

## 2024-01-01 MED ORDER — IRBESARTAN 150 MG PO TABS
150.0000 mg | ORAL_TABLET | Freq: Every day | ORAL | Status: DC
  Administered 2024-01-01 – 2024-01-03 (×3): 150 mg via ORAL
  Filled 2024-01-01 (×3): qty 1

## 2024-01-01 MED ORDER — METOCLOPRAMIDE HCL 5 MG PO TABS: 5.0000 mg | ORAL_TABLET | Freq: Three times a day (TID) | ORAL | Status: DC | PRN

## 2024-01-01 MED ORDER — SODIUM CHLORIDE 0.9 % IR SOLN
Status: DC | PRN
  Administered 2024-01-01: 1000 mL

## 2024-01-01 MED ORDER — ROCURONIUM BROMIDE 10 MG/ML (PF) SYRINGE
PREFILLED_SYRINGE | INTRAVENOUS | Status: AC
  Filled 2024-01-01: qty 10

## 2024-01-01 MED ORDER — BUPIVACAINE IN DEXTROSE 0.75-8.25 % IT SOLN
INTRATHECAL | Status: DC | PRN
  Administered 2024-01-01: 1.8 mL via INTRATHECAL

## 2024-01-01 MED ORDER — CEFAZOLIN SODIUM-DEXTROSE 2-4 GM/100ML-% IV SOLN
2.0000 g | INTRAVENOUS | Status: AC
  Administered 2024-01-01: 2 g via INTRAVENOUS
  Filled 2024-01-01: qty 100

## 2024-01-01 MED ORDER — METOCLOPRAMIDE HCL 5 MG/ML IJ SOLN: 5.0000 mg | Freq: Three times a day (TID) | INTRAMUSCULAR | Status: DC | PRN

## 2024-01-01 MED ORDER — HYDROCODONE-ACETAMINOPHEN 7.5-325 MG PO TABS
1.0000 | ORAL_TABLET | Freq: Three times a day (TID) | ORAL | Status: DC | PRN
  Administered 2024-01-02 – 2024-01-03 (×3): 1 via ORAL
  Administered 2024-01-03: 2 via ORAL
  Filled 2024-01-01: qty 1
  Filled 2024-01-01 (×3): qty 2

## 2024-01-01 MED ORDER — VANCOMYCIN HCL 1000 MG IV SOLR
INTRAVENOUS | Status: AC
  Filled 2024-01-01: qty 20

## 2024-01-01 MED ORDER — PHENYLEPHRINE HCL-NACL 20-0.9 MG/250ML-% IV SOLN
INTRAVENOUS | Status: DC | PRN
  Administered 2024-01-01: 30 ug/min via INTRAVENOUS

## 2024-01-01 MED ORDER — METHOCARBAMOL 500 MG PO TABS
500.0000 mg | ORAL_TABLET | Freq: Four times a day (QID) | ORAL | Status: DC | PRN
  Administered 2024-01-01 – 2024-01-03 (×3): 500 mg via ORAL
  Filled 2024-01-01 (×3): qty 1

## 2024-01-01 MED ORDER — DOCUSATE SODIUM 100 MG PO CAPS
100.0000 mg | ORAL_CAPSULE | Freq: Two times a day (BID) | ORAL | Status: DC
  Administered 2024-01-01 – 2024-01-03 (×4): 100 mg via ORAL
  Filled 2024-01-01 (×4): qty 1

## 2024-01-01 MED ORDER — ASPIRIN 81 MG PO CHEW
81.0000 mg | CHEWABLE_TABLET | Freq: Two times a day (BID) | ORAL | Status: DC
  Administered 2024-01-01 – 2024-01-03 (×4): 81 mg via ORAL
  Filled 2024-01-01 (×4): qty 1

## 2024-01-01 MED ORDER — BUPIVACAINE-MELOXICAM ER 400-12 MG/14ML IJ SOLN
INTRAMUSCULAR | Status: DC | PRN
  Administered 2024-01-01: 400 mg

## 2024-01-01 MED ORDER — DIPHENHYDRAMINE HCL 12.5 MG/5ML PO ELIX
25.0000 mg | ORAL_SOLUTION | ORAL | Status: DC | PRN
  Administered 2024-01-01: 25 mg via ORAL
  Filled 2024-01-01: qty 10

## 2024-01-01 MED ORDER — CHLORHEXIDINE GLUCONATE 4 % EX SOLN
1.0000 | CUTANEOUS | 1 refills | Status: AC
  Filled 2024-01-01: qty 946, 44d supply, fill #0

## 2024-01-01 MED ORDER — ACETAMINOPHEN 500 MG PO TABS
1000.0000 mg | ORAL_TABLET | Freq: Four times a day (QID) | ORAL | Status: AC
  Administered 2024-01-02 (×2): 1000 mg via ORAL
  Filled 2024-01-01 (×3): qty 2

## 2024-01-01 MED ORDER — HYDROCODONE-ACETAMINOPHEN 5-325 MG PO TABS
1.0000 | ORAL_TABLET | Freq: Three times a day (TID) | ORAL | Status: DC | PRN
  Administered 2024-01-01: 2 via ORAL
  Filled 2024-01-01: qty 2

## 2024-01-01 MED ORDER — VANCOMYCIN HCL 1 G IV SOLR
INTRAVENOUS | Status: DC | PRN
  Administered 2024-01-01: 1000 mg via TOPICAL

## 2024-01-01 MED ORDER — MUPIROCIN 2 % EX OINT
1.0000 | TOPICAL_OINTMENT | Freq: Two times a day (BID) | CUTANEOUS | 0 refills | Status: AC
  Filled 2024-01-01: qty 22, 22d supply, fill #0

## 2024-01-01 MED ORDER — CARVEDILOL 6.25 MG PO TABS
6.2500 mg | ORAL_TABLET | Freq: Two times a day (BID) | ORAL | Status: DC
  Administered 2024-01-01 – 2024-01-03 (×4): 6.25 mg via ORAL
  Filled 2024-01-01 (×4): qty 1

## 2024-01-01 MED ORDER — SODIUM CHLORIDE 0.9 % IV SOLN: INTRAVENOUS | Status: DC

## 2024-01-01 MED ORDER — METHOCARBAMOL 1000 MG/10ML IJ SOLN: 500.0000 mg | Freq: Four times a day (QID) | INTRAMUSCULAR | Status: DC | PRN

## 2024-01-01 MED ORDER — PHENOL 1.4 % MT LIQD
1.0000 | OROMUCOSAL | Status: DC | PRN
Start: 2024-01-01 — End: 2024-01-03

## 2024-01-01 MED ORDER — MIDAZOLAM HCL 2 MG/2ML IJ SOLN
INTRAMUSCULAR | Status: AC
  Filled 2024-01-01: qty 2

## 2024-01-01 MED ORDER — TRANEXAMIC ACID-NACL 1000-0.7 MG/100ML-% IV SOLN
1000.0000 mg | INTRAVENOUS | Status: AC
  Administered 2024-01-01: 1000 mg via INTRAVENOUS
  Filled 2024-01-01: qty 100

## 2024-01-01 MED ORDER — ASPIRIN 81 MG PO CHEW
81.0000 mg | CHEWABLE_TABLET | Freq: Two times a day (BID) | ORAL | 0 refills | Status: AC
  Filled 2024-01-01: qty 84, 42d supply, fill #0

## 2024-01-01 MED ORDER — ACETAMINOPHEN 325 MG PO TABS
325.0000 mg | ORAL_TABLET | Freq: Four times a day (QID) | ORAL | Status: DC | PRN
  Administered 2024-01-02: 650 mg via ORAL
  Filled 2024-01-01: qty 2

## 2024-01-01 MED ORDER — ONDANSETRON HCL 4 MG PO TABS: 4.0000 mg | ORAL_TABLET | Freq: Four times a day (QID) | ORAL | Status: DC | PRN

## 2024-01-01 MED ORDER — POLYETHYLENE GLYCOL 3350 17 G PO PACK
17.0000 g | PACK | Freq: Every day | ORAL | Status: DC
  Administered 2024-01-02 – 2024-01-03 (×2): 17 g via ORAL
  Filled 2024-01-01 (×3): qty 1

## 2024-01-01 MED ORDER — OXYCODONE HCL 5 MG PO TABS: 5.0000 mg | ORAL_TABLET | Freq: Once | ORAL | Status: DC | PRN

## 2024-01-01 MED ORDER — INSULIN ASPART 100 UNIT/ML IJ SOLN: 0.0000 [IU] | INTRAMUSCULAR | Status: DC | PRN

## 2024-01-01 MED ORDER — ORAL CARE MOUTH RINSE: 15.0000 mL | Freq: Once | OROMUCOSAL | Status: AC

## 2024-01-01 MED ORDER — LACTATED RINGERS IV SOLN: INTRAVENOUS | Status: DC

## 2024-01-01 MED ORDER — ONDANSETRON HCL 4 MG/2ML IJ SOLN
INTRAMUSCULAR | Status: AC
Start: 2024-01-01 — End: 2024-01-01
  Filled 2024-01-01: qty 8

## 2024-01-01 MED ORDER — LIDOCAINE 2% (20 MG/ML) 5 ML SYRINGE
INTRAMUSCULAR | Status: AC
Start: 2024-01-01 — End: 2024-01-01
  Filled 2024-01-01: qty 5

## 2024-01-01 MED ORDER — CHLORHEXIDINE GLUCONATE 4 % EX SOLN: 1.0000 | CUTANEOUS | 1 refills | Status: DC

## 2024-01-01 MED ORDER — FENTANYL CITRATE (PF) 100 MCG/2ML IJ SOLN: 25.0000 ug | INTRAMUSCULAR | Status: DC | PRN

## 2024-01-01 MED ORDER — MENTHOL 3 MG MT LOZG: 1.0000 | LOZENGE | OROMUCOSAL | Status: DC | PRN

## 2024-01-01 MED ORDER — DEXAMETHASONE SODIUM PHOSPHATE 10 MG/ML IJ SOLN
10.0000 mg | Freq: Once | INTRAMUSCULAR | Status: AC
  Administered 2024-01-02: 10 mg via INTRAVENOUS
  Filled 2024-01-01: qty 1

## 2024-01-01 MED ORDER — CEFAZOLIN SODIUM-DEXTROSE 2-4 GM/100ML-% IV SOLN
2.0000 g | Freq: Four times a day (QID) | INTRAVENOUS | Status: AC
  Administered 2024-01-01 – 2024-01-02 (×2): 2 g via INTRAVENOUS
  Filled 2024-01-01 (×2): qty 100

## 2024-01-01 MED ORDER — PRONTOSAN WOUND IRRIGATION OPTIME
TOPICAL | Status: DC | PRN
  Administered 2024-01-01: 1 via TOPICAL

## 2024-01-01 MED ORDER — AMLODIPINE BESYLATE 5 MG PO TABS
10.0000 mg | ORAL_TABLET | Freq: Every day | ORAL | Status: DC
  Administered 2024-01-01: 10 mg via ORAL
  Filled 2024-01-01 (×2): qty 2

## 2024-01-01 MED ORDER — OXYCODONE HCL 5 MG/5ML PO SOLN: 5.0000 mg | Freq: Once | ORAL | Status: DC | PRN

## 2024-01-01 MED ORDER — CHLORHEXIDINE GLUCONATE 0.12 % MT SOLN
15.0000 mL | Freq: Once | OROMUCOSAL | Status: AC
  Administered 2024-01-01: 15 mL via OROMUCOSAL
  Filled 2024-01-01: qty 15

## 2024-01-01 MED ORDER — PROPOFOL 10 MG/ML IV BOLUS
INTRAVENOUS | Status: DC | PRN
  Administered 2024-01-01: 20 mg via INTRAVENOUS

## 2024-01-01 MED ORDER — DEXAMETHASONE SODIUM PHOSPHATE 10 MG/ML IJ SOLN
INTRAMUSCULAR | Status: AC
  Filled 2024-01-01: qty 3

## 2024-01-01 MED ORDER — PROPOFOL 500 MG/50ML IV EMUL
INTRAVENOUS | Status: DC | PRN
  Administered 2024-01-01: 100 ug/kg/min via INTRAVENOUS

## 2024-01-01 MED ORDER — BUPIVACAINE-MELOXICAM ER 400-12 MG/14ML IJ SOLN
INTRAMUSCULAR | Status: AC
  Filled 2024-01-01: qty 1

## 2024-01-01 MED ORDER — PHENYLEPHRINE 80 MCG/ML (10ML) SYRINGE FOR IV PUSH (FOR BLOOD PRESSURE SUPPORT)
PREFILLED_SYRINGE | INTRAVENOUS | Status: AC
  Filled 2024-01-01: qty 10

## 2024-01-01 MED ORDER — MAGNESIUM CITRATE PO SOLN: 1.0000 | Freq: Once | ORAL | Status: DC | PRN

## 2024-01-01 MED ORDER — MUPIROCIN 2 % EX OINT: 1.0000 | TOPICAL_OINTMENT | Freq: Two times a day (BID) | CUTANEOUS | 0 refills | Status: DC

## 2024-01-01 MED ORDER — PANTOPRAZOLE SODIUM 40 MG PO TBEC
40.0000 mg | DELAYED_RELEASE_TABLET | Freq: Every day | ORAL | Status: DC
  Administered 2024-01-01 – 2024-01-03 (×3): 40 mg via ORAL
  Filled 2024-01-01 (×3): qty 1

## 2024-01-01 MED ORDER — ONDANSETRON HCL 4 MG/2ML IJ SOLN: 4.0000 mg | Freq: Four times a day (QID) | INTRAMUSCULAR | Status: DC | PRN

## 2024-01-01 MED ORDER — MORPHINE SULFATE (PF) 2 MG/ML IV SOLN
0.5000 mg | Freq: Four times a day (QID) | INTRAVENOUS | Status: DC | PRN
  Administered 2024-01-01 – 2024-01-02 (×2): 1 mg via INTRAVENOUS
  Filled 2024-01-01 (×2): qty 1

## 2024-01-01 MED ORDER — SORBITOL 70 % SOLN: 30.0000 mL | Freq: Every day | Status: DC | PRN

## 2024-01-01 MED ORDER — 0.9 % SODIUM CHLORIDE (POUR BTL) OPTIME
TOPICAL | Status: DC | PRN
  Administered 2024-01-01: 1000 mL

## 2024-01-01 MED ORDER — TRANEXAMIC ACID 1000 MG/10ML IV SOLN
INTRAVENOUS | Status: DC | PRN
  Administered 2024-01-01: 2000 mg via TOPICAL

## 2024-01-01 MED ORDER — CARVEDILOL 3.125 MG PO TABS
6.2500 mg | ORAL_TABLET | Freq: Once | ORAL | Status: AC
  Administered 2024-01-01: 6.25 mg via ORAL
  Filled 2024-01-01: qty 2

## 2024-01-01 MED ORDER — ALUM & MAG HYDROXIDE-SIMETH 200-200-20 MG/5ML PO SUSP: 30.0000 mL | ORAL | Status: DC | PRN

## 2024-01-01 MED ORDER — OXYCODONE-ACETAMINOPHEN 5-325 MG PO TABS
1.0000 | ORAL_TABLET | Freq: Four times a day (QID) | ORAL | 0 refills | Status: AC | PRN
  Filled 2024-01-01: qty 40, 5d supply, fill #0

## 2024-01-01 MED ORDER — TRANEXAMIC ACID-NACL 1000-0.7 MG/100ML-% IV SOLN
1000.0000 mg | Freq: Once | INTRAVENOUS | Status: AC
  Administered 2024-01-01: 1000 mg via INTRAVENOUS
  Filled 2024-01-01: qty 100

## 2024-01-01 MED ORDER — MIDAZOLAM HCL 2 MG/2ML IJ SOLN
INTRAMUSCULAR | Status: DC | PRN
  Administered 2024-01-01: 2 mg via INTRAVENOUS

## 2024-01-01 MED ORDER — TRANEXAMIC ACID 1000 MG/10ML IV SOLN
2000.0000 mg | INTRAVENOUS | Status: DC
  Filled 2024-01-01: qty 20

## 2024-01-01 MED ORDER — POVIDONE-IODINE 10 % EX SWAB
2.0000 | Freq: Once | CUTANEOUS | Status: AC
  Administered 2024-01-01: 2 via TOPICAL

## 2024-01-01 MED ORDER — DOXYCYCLINE HYCLATE 100 MG PO TABS
100.0000 mg | ORAL_TABLET | Freq: Two times a day (BID) | ORAL | Status: DC
  Administered 2024-01-01 – 2024-01-03 (×4): 100 mg via ORAL
  Filled 2024-01-01 (×4): qty 1

## 2024-01-01 SURGICAL SUPPLY — 48 items
BAG COUNTER SPONGE SURGICOUNT (BAG) ×1 IMPLANT
BAG DECANTER FOR FLEXI CONT (MISCELLANEOUS) ×1 IMPLANT
BLADE SAG 18X100X1.27 (BLADE) ×1 IMPLANT
COVER PERINEAL POST (MISCELLANEOUS) ×1 IMPLANT
COVER SURGICAL LIGHT HANDLE (MISCELLANEOUS) ×1 IMPLANT
CUP ACETAB W/GRIPTION 54 (Plate) IMPLANT
DERMABOND ADVANCED .7 DNX12 (GAUZE/BANDAGES/DRESSINGS) IMPLANT
DRAPE C-ARM 42X72 X-RAY (DRAPES) ×1 IMPLANT
DRAPE POUCH INSTRU U-SHP 10X18 (DRAPES) ×1 IMPLANT
DRAPE STERI IOBAN 125X83 (DRAPES) ×1 IMPLANT
DRAPE U-SHAPE 47X51 STRL (DRAPES) ×2 IMPLANT
DRSG AQUACEL AG ADV 3.5X 6 (GAUZE/BANDAGES/DRESSINGS) IMPLANT
DRSG AQUACEL AG ADV 3.5X10 (GAUZE/BANDAGES/DRESSINGS) ×1 IMPLANT
DURAPREP 26ML APPLICATOR (WOUND CARE) ×2 IMPLANT
ELECTRODE BLDE 4.0 EZ CLN MEGD (MISCELLANEOUS) ×1 IMPLANT
ELECTRODE REM PT RTRN 9FT ADLT (ELECTROSURGICAL) ×1 IMPLANT
FEMORAL STEM 12/14 TPR SZ4 HIP (Orthopedic Implant) IMPLANT
GLOVE BIOGEL PI IND STRL 7.0 (GLOVE) ×2 IMPLANT
GLOVE BIOGEL PI IND STRL 7.5 (GLOVE) ×5 IMPLANT
GLOVE ECLIPSE 7.0 STRL STRAW (GLOVE) ×2 IMPLANT
GLOVE SURG SYN 7.5 PF PI (GLOVE) ×2 IMPLANT
GOWN STRL REUS W/ TWL LRG LVL3 (GOWN DISPOSABLE) IMPLANT
GOWN STRL REUS W/ TWL XL LVL3 (GOWN DISPOSABLE) ×1 IMPLANT
GOWN STRL SURGICAL XL XLNG (GOWN DISPOSABLE) ×1 IMPLANT
GOWN TOGA ZIPPER T7+ PEEL AWAY (MISCELLANEOUS) ×1 IMPLANT
HEAD FEM CER ARTIC -2 36 12/14 (Head) IMPLANT
HOOD PEEL AWAY T7 (MISCELLANEOUS) ×1 IMPLANT
IV NS IRRIG 3000ML ARTHROMATIC (IV SOLUTION) ×1 IMPLANT
KIT BASIN OR (CUSTOM PROCEDURE TRAY) ×1 IMPLANT
LINER NEUTRAL 54X36MM PLUS 4 (Hips) IMPLANT
MARKER SKIN DUAL TIP RULER LAB (MISCELLANEOUS) ×1 IMPLANT
NDL SPNL 18GX3.5 QUINCKE PK (NEEDLE) ×1 IMPLANT
NEEDLE SPNL 18GX3.5 QUINCKE PK (NEEDLE) ×1 IMPLANT
PACK TOTAL JOINT (CUSTOM PROCEDURE TRAY) ×1 IMPLANT
PACK UNIVERSAL I (CUSTOM PROCEDURE TRAY) ×1 IMPLANT
SET HNDPC FAN SPRY TIP SCT (DISPOSABLE) ×1 IMPLANT
SOLUTION PRONTOSAN WOUND 350ML (IRRIGATION / IRRIGATOR) ×1 IMPLANT
SUT ETHIBOND 2 V 37 (SUTURE) ×1 IMPLANT
SUT STRATAFIX PDS+ 0 24IN (SUTURE) IMPLANT
SUT VIC AB 0 CT1 27XBRD ANBCTR (SUTURE) ×1 IMPLANT
SUT VIC AB 1 CTX36XBRD ANBCTR (SUTURE) ×1 IMPLANT
SUT VIC AB 2-0 CT1 TAPERPNT 27 (SUTURE) ×2 IMPLANT
SYR 50ML LL SCALE MARK (SYRINGE) ×1 IMPLANT
TOWEL GREEN STERILE (TOWEL DISPOSABLE) ×1 IMPLANT
TRAY CATH INTERMITTENT SS 16FR (CATHETERS) IMPLANT
TRAY FOLEY W/BAG SLVR 16FR ST (SET/KITS/TRAYS/PACK) IMPLANT
TUBE SUCT ARGYLE STRL (TUBING) ×1 IMPLANT
YANKAUER SUCT BULB TIP NO VENT (SUCTIONS) ×1 IMPLANT

## 2024-01-01 NOTE — Anesthesia Procedure Notes (Signed)
 Spinal  Patient location during procedure: OR Start time: 01/01/2024 1:46 PM End time: 01/01/2024 1:49 PM Reason for block: surgical anesthesia Staffing Performed: anesthesiologist  Anesthesiologist: Maryclare Cornet, MD Performed by: Maryclare Cornet, MD Authorized by: Maryclare Cornet, MD   Preanesthetic Checklist Completed: patient identified, IV checked, risks and benefits discussed, surgical consent, monitors and equipment checked, pre-op evaluation and timeout performed Spinal Block Patient position: sitting Prep: DuraPrep Patient monitoring: cardiac monitor, continuous pulse ox and blood pressure Approach: midline Location: L3-4 Injection technique: single-shot Needle Needle type: Pencan  Needle gauge: 24 G Needle length: 9 cm Assessment Sensory level: T10 Events: CSF return Additional Notes Functioning IV was confirmed and monitors were applied. Sterile prep and drape, including hand hygiene and sterile gloves were used. The patient was positioned and the spine was prepped. The skin was anesthetized with lidocaine .  Free flow of clear CSF was obtained prior to injecting local anesthetic into the CSF.  The spinal needle aspirated freely following injection.  The needle was carefully withdrawn.  The patient tolerated the procedure well.

## 2024-01-01 NOTE — Discharge Instructions (Signed)

## 2024-01-01 NOTE — H&P (Signed)
 PREOPERATIVE H&P  Chief Complaint: RIGHT HIP OSTEOARTHRITIS  HPI: Daniel Perkins is a 68 y.o. male who presents for surgical treatment of RIGHT HIP OSTEOARTHRITIS.  He denies any changes in medical history.  Past Surgical History:  Procedure Laterality Date   ANTERIOR CERVICAL DECOMP/DISCECTOMY FUSION  X 2   CARDIAC CATHETERIZATION  07/2009   CLOSED REDUCTION NASAL FRACTURE     when 68 years old   CLOSED REDUCTION NASAL FRACTURE Bilateral 09/21/2021   Procedure: CLOSED REDUCTION NASAL FRACTURE;  Surgeon: Carlie Clark, MD;  Location: [facility];  Service: ENT;  Laterality: Bilateral;   FOOT FRACTURE SURGERY Right 01/07/1989   pt a steel plate in   FOOT SURGERY Left x16   joints collapsed; keep getting infected   LAPAROSCOPY N/A 03/05/2021   Procedure: LAPAROSCOPY DIAGNOSTIC;  Surgeon: Signe Mitzie LABOR, MD;  Location: [facility] OR;  Service: General;  Laterality: N/A;   LAPAROTOMY Right 03/05/2021   Procedure: EXPLORATORY LAPAROTOMY, REPAIR SMALL BOWEL PERFORATION;  Surgeon: Signe Mitzie LABOR, MD;  [facility];  Service: General;  Laterality: Right;   LYSIS OF ADHESION  03/05/2021   Procedure: LYSIS OF ADHESION;  Surgeon: Signe Mitzie LABOR, MD;  Location[facility];  Service: General;;   POSTERIOR CERVICAL FUSION/FORAMINOTOMY  X 2   Social History   Socioeconomic History   Marital status: Legally Separated    Spouse name: Not on file   Number of children: Not on file   Years of education: 13   Highest education level: Not on file  Occupational History    Employer: UNEMPLOYED   Occupation: unemployed  Tobacco Use   Smoking status: Former    Current packs/day: 0.00    Average packs/day: 1 pack/day for 32.0 years (32.0 ttl pk-yrs)    Types: Cigarettes    Start date: 07/06/1975    Quit date: 07/06/2007    Years since quitting: 16.5   Smokeless tobacco: Never  Vaping Use   Vaping status: Never Used  Substance and Sexual Activity   Alcohol use: Not  Currently   Drug use: No   Sexual activity: Yes    Partners: Female  Other Topics Concern   Not on file  Social History Narrative   Not on file   Social Drivers of Health   Financial Resource Strain: Not on file  Food Insecurity: Not on file  Transportation Needs: Not on file  Physical Activity: Not on file  Stress: Not on file  Social Connections: Not on file   Family History  Problem Relation Age of Onset   Diabetes Mother    Coronary artery disease Mother        HAS PACEMAKER   Heart disease Mother    Heart attack Brother    Lung disease Neg Hx    Allergies  Allergen Reactions   Morphine  Itching   Pravastatin  Sodium Other (See Comments)    Severe muscle cramps with one time requiring admission.    Prior to Admission medications   Medication Sig Start Date End Date Taking? Authorizing Provider  acetaminophen  (TYLENOL ) 325 MG tablet Take 2 tablets (650 mg total) by mouth with breakfast, with lunch, and with evening meal. Patient taking differently: Take 650 mg by mouth daily as needed for mild pain (pain score 1-3) or moderate pain (pain score 4-6). 04/09/21  Yes Love, Sharlet RAMAN, PA-C  allopurinol  (ZYLOPRIM ) 100 MG tablet Take 100 mg by mouth 2 (two) times daily. 02/26/22  Yes [provider]  amLODipine  (NORVASC ) 10 MG tablet Take 10 mg by mouth daily.   Yes [provider]  aspirin  EC 81 MG tablet Take 81 mg by mouth daily. Swallow whole.   Yes [provider]  carvedilol  (COREG ) 6.25 MG tablet Take 6.25 mg by mouth 2 (two) times daily with a meal.   Yes [provider]  diclofenac  Sodium (VOLTAREN ) 1 % GEL Apply 2 g topically 4 (four) times daily. Patient taking differently: Apply 1 Application topically daily as needed (pain). 04/09/21  Yes Love, Sharlet RAMAN, PA-C  docusate sodium  (COLACE) 100 MG capsule Take 1 capsule (100 mg total) by mouth daily as needed. 12/29/23 12/28/24  Jule Ronal CROME, PA-C  doxycycline  (VIBRAMYCIN ) 100 MG capsule  Take 1 capsule (100 mg total) by mouth 2 (two) times daily. To be taken after surgery 12/29/23   Jule Ronal CROME, PA-C  gabapentin  (NEURONTIN ) 100 MG capsule Take 2 capsules (200 mg total) by mouth 2 (two) times daily. 08/17/21  Yes Raulkar, Sven SQUIBB, MD  insulin  glargine (LANTUS ) 100 UNIT/ML Solostar Pen Inject 15 Units into the skin daily. 04/09/21  Yes Love, Sharlet RAMAN, PA-C  methocarbamol  (ROBAXIN ) 750 MG tablet Take 1 tablet (750 mg total) by mouth 3 (three) times daily as needed. 12/29/23   Jule Ronal CROME, PA-C  nitroGLYCERIN  (NITROSTAT ) 0.4 MG SL tablet DISSOLVE 1 TABLET UNDER THE TONGUE EVERY 5 MINUTES AS  NEEDED FOR CHEST PAIN , MAX 3 TABLETS IN 15 MINUTES,  CALL 911 IF NO RELIEF. 04/07/20  Yes Katsadouros, Vasilios, MD  ondansetron  (ZOFRAN ) 4 MG tablet Take 1 tablet (4 mg total) by mouth every 8 (eight) hours as needed for nausea or vomiting. 12/29/23   Jule Ronal CROME, PA-C  oxyCODONE -acetaminophen  (PERCOCET) 10-325 MG tablet Take 1 tablet by mouth 2 (two) times daily as needed. 12/11/23  Yes [provider]  rosuvastatin  (CRESTOR ) 10 MG tablet Take 1 tablet (10 mg total) by mouth daily. 05/11/22  Yes Dann Candyce RAMAN, MD  valsartan  (DIOVAN ) 160 MG tablet Take 160 mg by mouth daily.   Yes [provider]  Accu-Chek Softclix Lancets lancets Check once a day 10/11/19   Arminda Daved HERO, MD  benzonatate  (TESSALON ) 100 MG capsule Take 1 capsule (100 mg total) by mouth every 8 (eight) hours. Patient not taking: Reported on 12/27/2023 07/26/23   Johnie Flaming A, NP  colchicine  0.6 MG tablet Take 1 tablet (0.6 mg total) by mouth daily. Patient not taking: Reported on 12/27/2023 04/09/21   Maurice Sharlet RAMAN, PA-C  cyclobenzaprine  (FLEXERIL ) 5 MG tablet Take 5 mg by mouth every 8 (eight) hours as needed. Patient not taking: Reported on 12/27/2023 02/24/22   [provider]  glucose blood (ACCU-CHEK GUIDE) test strip Check once a day 10/11/19   Arminda Daved HERO, MD  Insulin  Pen  Needle 32G X 4 MM MISC Use to inject victoza  one time a day 11/15/17   Jerrell Cleatus Ned, MD  loperamide  (IMODIUM ) 2 MG capsule Take 1 capsule (2 mg total) by mouth 4 (four) times daily as needed for diarrhea or loose stools. Patient not taking: Reported on 12/27/2023 07/26/23   Johnie Flaming A, NP     Positive ROS: All other systems have been reviewed and were otherwise negative with the exception of those mentioned in the HPI and as above.  Physical Exam: General: Alert, no acute distress Cardiovascular: No pedal edema Respiratory: No cyanosis, no use of accessory musculature GI: abdomen soft Skin: No lesions in the area  of chief complaint Neurologic: Sensation intact distally Psychiatric: Patient is competent for consent with normal mood and affect Lymphatic: no lymphedema  MUSCULOSKELETAL: exam stable  Assessment: RIGHT HIP OSTEOARTHRITIS  Plan: Plan for Procedure(s): ARTHROPLASTY, HIP, TOTAL, ANTERIOR APPROACH  The risks benefits and alternatives were discussed with the patient including but not limited to the risks of nonoperative treatment, versus surgical intervention including infection, bleeding, nerve injury,  blood clots, cardiopulmonary complications, morbidity, mortality, among others, and they were willing to proceed.   Ozell Cummins, MD 01/01/2024 10:16 AM

## 2024-01-01 NOTE — Progress Notes (Signed)
 Pt arrived to 6 north room 18 alert and oriented x4. Pain level 5/10 in right hip. Bed in lowest position. Call light in reach. Family at bedside. Dinner tray ordered/ all needs met at this time

## 2024-01-01 NOTE — Transfer of Care (Signed)
 Immediate Anesthesia Transfer of Care Note  Patient: Daniel Perkins  Procedure(s) Performed: ARTHROPLASTY, HIP, TOTAL, ANTERIOR APPROACH (Right: Hip)  Patient Location: PACU  Anesthesia Type:Spinal  Level of Consciousness: sedated  Airway & Oxygen Therapy: Patient Spontanous Breathing  Post-op Assessment: Report given to RN and Post -op Vital signs reviewed and stable  Post vital signs: Reviewed and stable  Last Vitals:  Vitals Value Taken Time  BP 96/76 01/01/24 15:26  Temp    Pulse 73 01/01/24 15:28  Resp 24 01/01/24 15:28  SpO2 93 % 01/01/24 15:28  Vitals shown include unfiled device data.  Last Pain:  Vitals:   01/01/24 1035  TempSrc: Oral  PainSc: 6       Patients Stated Pain Goal: 0 (01/01/24 1035)  Complications: No notable events documented.

## 2024-01-01 NOTE — Op Note (Signed)
 ARTHROPLASTY, HIP, TOTAL, ANTERIOR APPROACH  Procedure Note Daniel Perkins   [id number]  Pre-op Diagnosis: RIGHT HIP OSTEOARTHRITIS     Post-op Diagnosis: same  Operative Findings Complete loss of articular cartilage   Operative Procedures  1. Total hip replacement; Right hip; uncemented [id number]   Surgeon: Kay Cummins, M.D.  Assist: Daniel Daniel Grave, PA-C   Anesthesia: spinal  Prosthesis: Depuy Acetabulum: Pinnacle 54 mm Femur: Actis 4 HO Head: 36 mm size: -2 Liner: +4 neutral Bearing Type: ceramic/poly  Total Hip Arthroplasty (Anterior Approach) Op Note:  After informed consent was obtained and the operative extremity marked in the holding area, the patient was brought back to the operating room and placed supine on the HANA table. Next, the operative extremity was prepped and draped in normal sterile fashion. Surgical timeout occurred verifying patient identification, surgical site, surgical procedure and administration of antibiotics.  A bikini incision was made.  A Hueter approach to the hip was performed, using the interval between tensor fascia lata and sartorius.  Dissection was carried bluntly down onto the anterior hip capsule. The lateral femoral circumflex vessels were identified and coagulated. A capsulotomy was performed and the capsular flaps tagged for later repair.  The neck osteotomy was performed 1 fingerbreadth above the lesser trochanter. The femoral head was removed which showed severe wear, the acetabular rim was cleared of soft tissue and osteophytes and attention was turned to reaming the acetabulum.  Sequential reaming was performed under fluoroscopic guidance down to the floor of the cotyloid fossa. We reamed to a size 53 mm, and then impacted the acetabular shell. A +4 neutral liner was then placed after irrigation and attention turned to the femur.  After placing the femoral hook, the leg was taken to externally rotated, extended and adducted  position taking care to perform soft tissue releases to allow for adequate mobilization of the femur. Soft tissue was cleared from the shoulder of the greater trochanter and the hook elevator used to improve exposure of the proximal femur.  Lateral bone from the shoulder was rasped away for relief.  Sequential broaching performed up to a size 4.  High offset trial neck and +1.5 head were initially placed but felt his leg was too long so I then trialed with a -2 head. The leg was brought back up to neutral and the construct reduced.  The position and sizing of components, offset and leg lengths were checked using fluoroscopy. Stability of the construct was checked in 45 degrees of hip extension and 90 degrees of external rotation without any subluxation, shuck or impingement of prosthesis. We dislocated the prosthesis, dropped the leg back into position, removed trial components, and irrigated copiously. The final stem and head was then placed, the leg brought back up, the system reduced and fluoroscopy used to verify positioning.  Antibiotic irrigation was placed in the surgical wound.   We irrigated, obtained hemostasis and closed the capsule using #2 ethibond suture.  A topical mixture of 0.25% bupivacaine  and meloxicam  was placed deep to the fascia.  One gram of vancomycin  powder was placed in the surgical bed.   One gram of topical tranexamic acid  was injected into the joint.  The fascia was closed with #1 stratafix, the deep fat layer was closed with 0 vicryl, the subcutaneous layers closed with 2.0 Vicryl Plus and the skin closed with 2.0 nylon and dermabond. A sterile dressing was applied. The patient was awakened in the operating room and taken to recovery in stable condition.  All sponge, needle, and instrument counts were correct at the end of the case.   Daniel Perkins, my PA, was a medical necessity for opening, closing, limb positioning, retracting, exposing, and overall facilitation and timely  completion of the surgery.  Position: supine  Complications: see description of procedure.  Time Out: performed   Drains/Packing: none  Estimated blood loss: see anesthesia record  Returned to Recovery Room: in good condition.   Antibiotics: yes   Mechanical VTE (DVT) Prophylaxis: sequential compression devices, TED thigh-high  Chemical VTE (DVT) Prophylaxis: aspirin    Fluid Replacement: see anesthesia record  Specimens Removed: 1 to pathology   Sponge and Instrument Count Correct? yes   PACU: portable radiograph - low AP   Plan/RTC: Return in 2 weeks for suture removal. Weight Bearing/Load Lower Extremity: full  Hip precautions: none Suture Removal: 2 weeks   N. Ozell Cummins, MD Daniel Perkins 2:58 PM   Implant Name Type Inv. Item Serial No. Manufacturer Lot No. LRB No. Used Action  CUP ACETAB W/GRIPTION 54 - [id number] Plate CUP ACETAB W/GRIPTION 54  [employer] [id number] Right 1 Implanted  LINER NEUTRAL 54X36MM PLUS 4 - [id number] Hips LINER NEUTRAL 54X36MM PLUS 4  [employer] [id number] Right 1 Implanted  HEAD FEM CER ARTIC -2 36 12/14 - [id number] Head HEAD FEM CER ARTIC -2 36 12/14  [employer] [id number] Right 1 Implanted  FEMORAL STEM 12/14 TPR SZ4 HIP - [id number] Orthopedic Implant FEMORAL STEM 12/14 TPR SZ4 HIP  [employer] [id number] Right 1 Implanted

## 2024-01-02 ENCOUNTER — Other Ambulatory Visit: Payer: Self-pay | Admitting: Physician Assistant

## 2024-01-02 ENCOUNTER — Encounter (HOSPITAL_COMMUNITY): Payer: Self-pay | Admitting: Orthopaedic Surgery

## 2024-01-02 DIAGNOSIS — M1611 Unilateral primary osteoarthritis, right hip: Secondary | ICD-10-CM | POA: Diagnosis not present

## 2024-01-02 NOTE — Progress Notes (Addendum)
 Subjective: 1 Day Post-Op Procedure(s) (LRB): ARTHROPLASTY, HIP, TOTAL, ANTERIOR APPROACH (Right) Patient reports pain as moderate.  No chest pain, pressure, palpitations or sob despite requiring nasal cannula  Objective: Vital signs in last 24 hours: Temp:  [97.2 F (36.2 C)-99.1 F (37.3 C)] 99.1 F (37.3 C) (08/26 0434) Pulse Rate:  [57-87] 87 (08/26 0434) Resp:  [14-22] 17 (08/26 0434) BP: (83-154)/(55-96) 131/72 (08/26 0434) SpO2:  [87 %-98 %] 87 % (08/26 0434) Weight:  [97.5 kg] 97.5 kg (08/25 1035)  Intake/Output from previous day: 08/25 0701 - 08/26 0700 In: 1520 [P.O.:420; I.V.:1000; IV Piggyback:100] Out: 2100 [Urine:1900; Blood:200] Intake/Output this shift: No intake/output data recorded.  No results for input(s): HGB in the last 72 hours. No results for input(s): WBC, RBC, HCT, PLT in the last 72 hours. No results for input(s): NA, K, CL, CO2, BUN, CREATININE, GLUCOSE, CALCIUM  in the last 72 hours. No results for input(s): LABPT, INR in the last 72 hours.  Neurologically intact Neurovascular intact Sensation intact distally Intact pulses distally Dorsiflexion/Plantar flexion intact Incision: dressing C/D/I No cellulitis present Compartment soft   Assessment/Plan: 1 Day Post-Op Procedure(s) (LRB): ARTHROPLASTY, HIP, TOTAL, ANTERIOR APPROACH (Right) Advance diet Up with therapy D/C IV fluids Plan for discharge tomorrow to his sisters house with hhpt WBAT RLE Hypoxia- continue nasal cannula prn.  Back off pain meds if possible.  ? Sleep apnea Post-op meds sent to pharmacy last week.  Patient has not picked these up yet.       Ronal LITTIE Grave 01/02/2024, 8:17 AM

## 2024-01-02 NOTE — Progress Notes (Signed)
 Physical Therapy Treatment Patient Details Name: Daniel Perkins MRN: [id number] DOB: 1955/06/04 Today's Date: 01/02/2024   History of Present Illness 68 yo male presents to [facility] on 01/01/24 for R THA due to OA. 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 Pt able to stand from recliner and bed with min A. Needed min A to RLE into bed, lifting against gravity. Pt ambulated 17' with RW and CGA. Progressing quickly towards goals. PT will continue to follow.     If plan is discharge home, recommend the following: A little help with walking and/or transfers;A little help with bathing/dressing/bathroom;Assist for transportation;Help with stairs or ramp for entrance   Can travel by private vehicle        Equipment Recommendations  Rolling walker (2 wheels);BSC/3in1    Recommendations for Other Services       Precautions / Restrictions Precautions Precautions: Fall Recall of Precautions/Restrictions: Intact Restrictions Weight Bearing Restrictions Per Provider Order: Yes RLE Weight Bearing Per Provider Order: Weight bearing as tolerated     Mobility  Bed Mobility Overal bed mobility: Needs Assistance Bed Mobility: Sit to Supine     Supine to sit: Mod assist Sit to supine: Min assist   General bed mobility comments: min A for RLE into bed, pt managed self otherwise    Transfers Overall transfer level: Needs assistance Equipment used: Rolling walker (2 wheels) Transfers: Sit to/from Stand Sit to Stand: Min assist           General transfer comment: min A for power up with first STS, then CGA. Stood from Medical illustrator and EOB    Ambulation/Gait Ambulation/Gait assistance: Clinical research associate (Feet): 90 Feet Assistive device: Rolling walker (2 wheels) Gait Pattern/deviations: Step-to pattern, Antalgic, Decreased weight shift  to right Gait velocity: decreased Gait velocity interpretation: <1.8 ft/sec, indicate of risk for recurrent falls   General Gait Details: worked on getting R heel to floor with gait.   Stairs             Wheelchair Mobility     Tilt Bed    Modified Rankin (Stroke Patients Only)       Balance Overall balance assessment: Needs assistance Sitting-balance support: No upper extremity supported, Feet supported Sitting balance-Leahy Scale: Good     Standing balance support: Bilateral upper extremity supported, During functional activity Standing balance-Leahy Scale: Poor Standing balance comment: able to stand to use toilet with unilateral UE support                            Communication Communication Communication: No apparent difficulties  Cognition Arousal: Alert Behavior During Therapy: WFL for tasks assessed/performed   PT - Cognitive impairments: No apparent impairments                         Following commands: Intact      Cueing Cueing Techniques: Verbal cues  Exercises Total Joint Exercises Ankle Circles/Pumps: AROM, Both, 10 reps, Seated Quad Sets: AROM, Both, 10 reps, Supine Gluteal Sets: AROM, Both, 10 reps, Supine Heel Slides: AAROM, Right, 10 reps, Seated Hip ABduction/ADduction: AAROM, Right, 10 reps, Seated Straight Leg Raises: AAROM, Right, 5 reps, Seated Long Arc Quad: AROM, Right, 15 reps, Seated    General Comments General  comments (skin integrity, edema, etc.): SPO2 92% on RA      Pertinent Vitals/Pain Pain Assessment Pain Assessment: Faces Faces Pain Scale: Hurts even more Pain Location: R hip Pain Descriptors / Indicators: Aching, Operative site guarding, Sore Pain Intervention(s): Limited activity within patient's tolerance, Monitored during session    Home Living                          Prior Function            PT Goals (current goals can now be found in the care plan section) Acute  Rehab PT Goals Patient Stated Goal: return home PT Goal Formulation: With patient Time For Goal Achievement: 01/09/24 Potential to Achieve Goals: Good Progress towards PT goals: Progressing toward goals    Frequency    Min 3X/week      PT Plan      Co-evaluation              AM-PAC PT 6 Clicks Mobility   Outcome Measure  Help needed turning from your back to your side while in a flat bed without using bedrails?: A Little Help needed moving from lying on your back to sitting on the side of a flat bed without using bedrails?: A Little Help needed moving to and from a bed to a chair (including a wheelchair)?: A Little Help needed standing up from a chair using your arms (e.g., wheelchair or bedside chair)?: A Little Help needed to walk in hospital room?: A Little Help needed climbing 3-5 steps with a railing? : A Little 6 Click Score: 18    End of Session Equipment Utilized During Treatment: Gait belt Activity Tolerance: Patient tolerated treatment well Patient left: with call bell/phone within reach;in bed;with bed alarm set Nurse Communication: Mobility status PT Visit Diagnosis: Pain;Difficulty in walking, not elsewhere classified (R26.2) Pain - Right/Left: Right Pain - part of body: Hip     Time: 8493-8460 PT Time Calculation (min) (ACUTE ONLY): 33 min  Charges:    $Gait Training: 8-22 mins $Therapeutic Exercise: 8-22 mins PT General Charges $$ ACUTE PT VISIT: 1 Visit                     Richerd Lipoma, PT  Acute Rehab Services Secure chat preferred Office [phone]    Richerd CROME Jeryn Bertoni 01/02/2024, 3:59 PM

## 2024-01-02 NOTE — Evaluation (Signed)
 Physical Therapy Evaluation Patient Details Name: Daniel Perkins MRN: [id number] DOB: 08/19/1955 Today's Date: 01/02/2024  History of Present Illness  68 yo male presents to [facility] on 01/01/24 for R THA due to OA. 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 Pt from home independently, plan is to go to sister's home at d/c because his girlfriend is currently in the hospital. Pt moving well on eval, needed mod A first time up but expect he will progress quickly. Will need a RW and 3-in-1 for home. SPO2 remained in 90's with mobility but had been dropping in early AM, gave IS and taught to use. Ice placed R hip end of session. Will plan to see again later today.  Pt currently with functional limitations due to the deficits listed below (see PT Problem List). Pt will benefit from acute skilled PT to increase their independence and safety with mobility to allow discharge.           If plan is discharge home, recommend the following: A little help with walking and/or transfers;A little help with bathing/dressing/bathroom;Assist for transportation;Help with stairs or ramp for entrance   Can travel by private vehicle        Equipment Recommendations Rolling walker (2 wheels);BSC/3in1  Recommendations for Other Services       Functional Status Assessment Patient has had a recent decline in their functional status and demonstrates the ability to make significant improvements in function in a reasonable and predictable amount of time.     Precautions / Restrictions Precautions Precautions: Fall Recall of Precautions/Restrictions: Intact Restrictions Weight Bearing Restrictions Per Provider Order: Yes RLE Weight Bearing Per Provider Order: Weight bearing as tolerated      Mobility  Bed Mobility Overal bed mobility: Needs Assistance Bed Mobility: Supine to Sit     Supine to sit: Mod assist     General bed  mobility comments: mod A for mvmt of RLE. Pt managed trunk and then needed HHA to come fully to upright and shift wt to R    Transfers Overall transfer level: Needs assistance Equipment used: Rolling walker (2 wheels) Transfers: Sit to/from Stand Sit to Stand: Mod assist           General transfer comment: vc's for hand placement. Mod A for power up    Ambulation/Gait Ambulation/Gait assistance: Min assist Gait Distance (Feet): 15 Feet Assistive device: Rolling walker (2 wheels) Gait Pattern/deviations: Step-to pattern, Antalgic, Decreased weight shift to right Gait velocity: decreased Gait velocity interpretation: <1.31 ft/sec, indicative of household ambulator   General Gait Details: pt able to tolerate wt on RLE, short step length due to pain  Stairs            Wheelchair Mobility     Tilt Bed    Modified Rankin (Stroke Patients Only)       Balance Overall balance assessment: Needs assistance Sitting-balance support: No upper extremity supported, Feet supported Sitting balance-Leahy Scale: Good     Standing balance support: Bilateral upper extremity supported, During functional activity Standing balance-Leahy Scale: Poor                               Pertinent Vitals/Pain Pain Assessment Pain Assessment: Faces Faces Pain Scale: Hurts whole lot Pain Location: R hip Pain Descriptors / Indicators: Aching, Operative site guarding, Sore Pain Intervention(s): Limited activity within  patient's tolerance, Monitored during session    Home Living Family/patient expects to be discharged to:: Private residence Living Arrangements: Other relatives Available Help at Discharge: Family;Available 24 hours/day Type of Home: House Home Access: Stairs to enter Entrance Stairs-Rails: None Entrance Stairs-Number of Steps: 3   Home Layout: One level Home Equipment: None Additional Comments: pt lives with his girlfriend who is currently hospitalized at  [facility]. He is planning to go home with his sister (info above is her home) and stay until he is done with [facility]    Prior Function               Mobility Comments: independent without AD ADLs Comments: independent     Extremity/Trunk Assessment   Upper Extremity Assessment Upper Extremity Assessment: Defer to OT evaluation    Lower Extremity Assessment Lower Extremity Assessment: RLE deficits/detail RLE Deficits / Details: hip flex 1/5, knee ext 3/5, no buckling in standing. Ankle ROM limited bilaterally at baseline from club feet RLE: Unable to fully assess due to pain RLE Sensation: WNL RLE Coordination: decreased gross motor    Cervical / Trunk Assessment Cervical / Trunk Assessment: Normal  Communication   Communication Communication: No apparent difficulties    Cognition Arousal: Alert Behavior During Therapy: WFL for tasks assessed/performed   PT - Cognitive impairments: No apparent impairments                         Following commands: Intact       Cueing Cueing Techniques: Verbal cues     General Comments General comments (skin integrity, edema, etc.): SPO2 97% on RA after session. HR 92 bpm.    Exercises Total Joint Exercises Ankle Circles/Pumps: AROM, Both, 10 reps, Supine, Seated Quad Sets: AROM, Both, 10 reps, Supine Gluteal Sets: AROM, Both, 10 reps, Supine Straight Leg Raises: AAROM, Right, 5 reps, Seated   Assessment/Plan    PT Assessment Patient needs continued PT services  PT Problem List Decreased strength;Decreased activity tolerance;Decreased balance;Decreased range of motion;Decreased mobility;Decreased knowledge of use of DME;Decreased knowledge of precautions;Pain       PT Treatment Interventions DME instruction;Gait training;Stair training;Functional mobility training;Therapeutic activities;Therapeutic exercise;Balance training;Neuromuscular re-education;Patient/family education    PT Goals (Current goals can be found in  the Care Plan section)  Acute Rehab PT Goals Patient Stated Goal: return home PT Goal Formulation: With patient Time For Goal Achievement: 01/09/24 Potential to Achieve Goals: Good    Frequency Min 3X/week     Co-evaluation               AM-PAC PT 6 Clicks Mobility  Outcome Measure Help needed turning from your back to your side while in a flat bed without using bedrails?: A Little Help needed moving from lying on your back to sitting on the side of a flat bed without using bedrails?: A Lot Help needed moving to and from a bed to a chair (including a wheelchair)?: A Little Help needed standing up from a chair using your arms (e.g., wheelchair or bedside chair)?: A Lot Help needed to walk in hospital room?: A Little Help needed climbing 3-5 steps with a railing? : A Lot 6 Click Score: 15    End of Session Equipment Utilized During Treatment: Gait belt Activity Tolerance: Patient tolerated treatment well Patient left: in chair;with call bell/phone within reach;with chair alarm set Nurse Communication: Mobility status PT Visit Diagnosis: Pain;Difficulty in walking, not elsewhere classified (R26.2) Pain - Right/Left: Right Pain -  part of body: Hip    Time: 1002-1034 PT Time Calculation (min) (ACUTE ONLY): 32 min   Charges:   PT Evaluation $PT Eval Moderate Complexity: 1 Mod PT Treatments $Gait Training: 8-22 mins PT General Charges $$ ACUTE PT VISIT: 1 Visit         Richerd Lipoma, PT  Acute Rehab Services Secure chat preferred Office [phone]   Richerd CROME Nareh Matzke 01/02/2024, 10:55 AM

## 2024-01-02 NOTE — Anesthesia Postprocedure Evaluation (Signed)
 Anesthesia Post Note  Patient: Daniel Perkins  Procedure(s) Performed: ARTHROPLASTY, HIP, TOTAL, ANTERIOR APPROACH (Right: Hip)     Patient location during evaluation: PACU Anesthesia Type: MAC and Spinal Level of consciousness: oriented and awake and alert Pain management: pain level controlled Vital Signs Assessment: post-procedure vital signs reviewed and stable Respiratory status: spontaneous breathing, respiratory function stable and patient connected to nasal cannula oxygen Cardiovascular status: blood pressure returned to baseline and stable Postop Assessment: no headache, no backache and no apparent nausea or vomiting Anesthetic complications: no   No notable events documented.  Last Vitals:  Vitals:   01/02/24 0946 01/02/24 1630  BP: 123/69 (!) 106/58  Pulse: 90 89  Resp: 17 16  Temp:  36.9 C  SpO2: 91% 91%    Last Pain:  Vitals:   01/02/24 1630  TempSrc: Oral  PainSc:                  Janaiyah Blackard S

## 2024-01-02 NOTE — Care Management Obs Status (Signed)
 MEDICARE OBSERVATION STATUS NOTIFICATION   Patient Details  Name: Daniel Perkins MRN: [id number] Date of Birth: [birth year]   Medicare Observation Status Notification Given:  Yes    Jon Cruel 01/02/2024, 1:00 PM

## 2024-01-02 NOTE — Plan of Care (Signed)
  Problem: Pain Managment: Goal: General experience of comfort will improve and/or be controlled Outcome: Progressing   Problem: Coping: Goal: Level of anxiety will decrease Outcome: Progressing

## 2024-01-02 NOTE — Progress Notes (Signed)
 pt c/o pain not improved with current pain regimen. Paged MD on call.

## 2024-01-02 NOTE — TOC Initial Note (Signed)
 Transition of Care (TOC) - Initial/Assessment Note   Spoke to patient at bedside.   Patient plans on staying with his sister at discharge at [address] , [place].   Dr Daniel Perkins office arranged home health PT with [facility]. PAtient offered choice and in agreement with [facility].   Discussed rolling walker and 3 in1 with patient. Patient will need both pieces of equipment . Ordered with Daniel Perkins with [employer] .   Confirmed with Daniel Perkins with [facility] they will provide HHPT and provided sister's address.  Patient Details  Name: Daniel Perkins MRN: [id number] Date of Birth: 21-Jun-1955  Transition of Care [facility]) CM/SW Contact:    Daniel Powell Jansky, RN Phone Number: 01/02/2024, 10:19 AM  Clinical Narrative:                   Expected Discharge Plan: Home w Home Health Services Barriers to Discharge:  (work with PT, pain)   Patient Goals and CMS Choice Patient states their goals for this hospitalization and ongoing recovery are:: to return to home CMS Medicare.gov Compare Post Acute Care list provided to:: Patient Choice offered to / list presented to : Patient      Expected Discharge Plan and Services   Discharge Planning Services: CM Consult Post Acute Care Choice: Home Health Living arrangements for the past 2 months: Single Family Home                 DME Arranged: 3-N-1, Walker rolling DME Agency: [employer] Date DME Agency Contacted: 01/02/24 Time DME Agency Contacted: 1017 Representative spoke with at DME Agency: Daniel Perkins HH Arranged: PT HH Agency: [facility] ([facility]) Date HH Agency Contacted: 01/02/24 Time HH Agency Contacted: 1017    Prior Living Arrangements/Services Living arrangements for the past 2 months: Single Family Home Lives with:: Self Patient language and need for interpreter reviewed:: Yes Do you feel safe going back to the place where you live?: Yes      Need for Family Participation in Patient Care: Yes  (Comment) Care giver support system in place?: Yes (comment)   Criminal Activity/Legal Involvement Pertinent to Current Situation/Hospitalization: No - Comment as needed  Activities of Daily Living   ADL Screening (condition at time of admission) Independently performs ADLs?: Yes (appropriate for developmental age) Is the patient deaf or have difficulty hearing?: No Does the patient have difficulty seeing, even when wearing glasses/contacts?: No Does the patient have difficulty concentrating, remembering, or making decisions?: No  Permission Sought/Granted   Permission granted to share information with : Yes, Verbal Permission Granted     Permission granted to share info w AGENCY: [facility] and [facility]        Emotional Assessment Appearance:: Appears stated age Attitude/Demeanor/Rapport: Engaged Affect (typically observed): Appropriate Orientation: : Oriented to Self, Oriented to Place, Oriented to  Time, Oriented to Situation Alcohol / Substance Use: Not Applicable Psych Involvement: No (comment)  Admission diagnosis:  Osteoarthritis of right hip, unspecified osteoarthritis type [M16.11] Status post total replacement of right hip [Z96.641] Patient Active Problem List   Diagnosis Date Noted   Status post total replacement of right hip 01/01/2024   Primary osteoarthritis of right hip 12/31/2023   Hx SBO 04/12/2021   Wound dehiscence, surgical 04/12/2021   Essential hypertension    Diabetes mellitus type 2 in nonobese (HCC)    Debility 03/25/2021   Malnutrition of moderate degree 03/11/2021   Left hand pain 06/22/2020   Back pain 05/20/2020   Neck pain 08/27/2019  CKD (chronic kidney disease), 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   Hepatic steatosis 05/01/2019   Prostate hypertrophy 12/06/2018   Sleep apnea-like behavior 10/01/2014   Peripheral vascular disease (HCC) 02/10/2014   Microcytic anemia 02/06/2013   CTEV (congenital talipes equinovarus)    Statin intolerance 06/29/2012    Esophageal reflux 06/29/2012   Health care maintenance 06/28/2012   Gout 06/12/2012   Obesity, Class II, BMI 35-39.9 02/20/2012   DOE (dyspnea on exertion) 02/20/2012   Diabetes mellitus due to underlying condition, uncontrolled, with hyperglycemia (HCC) 09/06/2009   ERECTILE DYSFUNCTION, NON-ORGANIC 02/16/2009   Hyperlipemia 01/14/2009   HYPERTENSION 01/14/2009   PCP:  Maree Leni Edyth DELENA, MD Pharmacy:   [facility] [address] [place] - [address] [place] [address] Phone: [phone] Fax: [phone]  [facility]) - [place], [place] - [address] [place] [address] [place] [address] Phone: [phone] Fax: [phone]  [facility] - [place], [place] - [address] [place] [address] Phone: [phone] Fax: [phone]  [facility] [address] [place] [address] Phone: [phone] Fax: [phone]     Social Drivers of Health (SDOH) Social History: SDOH Screenings   Food Insecurity: No Food Insecurity (01/01/2024)  Housing: Unknown (01/01/2024)  Transportation Needs: No Transportation Needs (01/01/2024)  Utilities: Not At Risk (01/01/2024)  Depression (PHQ2-9): Low Risk  (07/06/2020)  Social Connections: Unknown (01/01/2024)  Tobacco Use: Medium Risk (01/01/2024)   SDOH Interventions:     Readmission Risk Interventions     No data to display

## 2024-01-02 NOTE — Plan of Care (Signed)

## 2024-01-03 ENCOUNTER — Telehealth: Payer: Self-pay | Admitting: Physician Assistant

## 2024-01-03 ENCOUNTER — Other Ambulatory Visit (HOSPITAL_COMMUNITY): Payer: Self-pay

## 2024-01-03 DIAGNOSIS — M1611 Unilateral primary osteoarthritis, right hip: Secondary | ICD-10-CM | POA: Diagnosis not present

## 2024-01-03 LAB — GLUCOSE, CAPILLARY
Glucose-Capillary: 300 mg/dL — ABNORMAL HIGH (ref 70–99)
Glucose-Capillary: 302 mg/dL — ABNORMAL HIGH (ref 70–99)
Glucose-Capillary: 209 mg/dL — ABNORMAL HIGH (ref 70–99)

## 2024-01-03 MED ORDER — INSULIN ASPART 100 UNIT/ML IJ SOLN
0.0000 [IU] | Freq: Three times a day (TID) | INTRAMUSCULAR | Status: DC
  Administered 2024-01-03: 11 [IU] via SUBCUTANEOUS

## 2024-01-03 NOTE — Discharge Summary (Signed)
 Patient ID: Daniel Perkins MRN: [id number] DOB/AGE: 11-18-1955 68 y.o.  Admit date: 01/01/2024 Discharge date: 01/03/2024  Admission Diagnoses:  Principal Problem:   Primary osteoarthritis of right hip Active Problems:   Status post total replacement of right hip   Discharge Diagnoses:  Same  Past Medical History:  Diagnosis Date   Arthritis    Club foot of both lower extremities 1958   Coronary artery disease    Holter monitor 02/2012 - sinus with rare PVC   CTEV (congenital talipes equinovarus)    Gout    Hyperlipidemia    Hypertension    Myocardial infarction (HCC) ~ 2007   mild   Osteomyelitis (HCC)    Pneumonia 2000's   couple times   Pneumonia due to COVID-19 virus 04/28/2019   Type II diabetes mellitus (HCC) 2011    Surgeries: Procedure(s): ARTHROPLASTY, HIP, TOTAL, ANTERIOR APPROACH on 01/01/2024   Consultants:   Discharged Condition: Improved  Hospital Course: [name] is an 68 y.o. male who was admitted 01/01/2024 for operative treatment ofPrimary osteoarthritis of right hip. Patient has severe unremitting pain that affects sleep, daily activities, and work/hobbies. After pre-op clearance the patient was taken to the operating room on 01/01/2024 and underwent  Procedure(s): ARTHROPLASTY, HIP, TOTAL, ANTERIOR APPROACH.    Patient was given perioperative antibiotics:  Anti-infectives (From admission, onward)    Start     Dose/Rate Route Frequency Ordered Stop   01/01/24 2200  doxycycline  (VIBRA -TABS) tablet 100 mg       Note to Pharmacy: To be taken after surgery     100 mg Oral 2 times daily 01/01/24 1750     01/01/24 2200  ceFAZolin  (ANCEF ) IVPB 2g/100 mL premix        2 g 200 mL/hr over 30 Minutes Intravenous Every 6 hours 01/01/24 1750 01/02/24 0358   01/01/24 1413  vancomycin  (VANCOCIN ) powder  Status:  Discontinued          As needed 01/01/24 1413 01/01/24 1522   01/01/24 1030  ceFAZolin  (ANCEF ) IVPB 2g/100 mL premix        2 g 200  mL/hr over 30 Minutes Intravenous On call to O.R. 01/01/24 1022 01/01/24 1346        Patient was given sequential compression devices, early ambulation, and chemoprophylaxis to prevent DVT.  Inpatient Morphine  Milligram Equivalents Per Day 8/25 - 8/27   Values displayed are in units of MME/Day    Order Start / End Date 8/25 Yesterday Today    oxyCODONE  (Oxy IR/ROXICODONE ) immediate release tablet 5 mg 8/25 - 8/25 0 of Unknown -- --    oxyCODONE  (ROXICODONE ) 5 MG/5ML solution 5 mg 8/25 - 8/25 0 of Unknown -- --      Group total: 0 of Unknown      HYDROcodone -acetaminophen  (NORCO/VICODIN) 5-325 MG per tablet 1-2 tablet 8/25 - No end date 10 of 5-10 0 of 15-30 0 of 15-30    HYDROcodone -acetaminophen  (NORCO) 7.5-325 MG per tablet 1-2 tablet 8/25 - No end date 0 of 7.5-15 15 of 22.5-45 7.5 of 22.5-45    morphine  (PF) 2 MG/ML injection 0.5-1 mg 8/25 - No end date 3 of 3-6 3 of 6-12 0 of 6-12    fentaNYL  (SUBLIMAZE ) injection 25-50 mcg 8/25 - 8/25 0 of 45-90 -- --    Daily Totals  13 of Unknown (at least 60.5-121) 18 of 43.5-87 7.5 of 43.5-87    Calculation Errors     Order Type Date Details   oxyCODONE  (Oxy  IR/ROXICODONE ) immediate release tablet 5 mg Ordered Dose -- Insufficient frequency information   oxyCODONE  (ROXICODONE ) 5 MG/5ML solution 5 mg Ordered Dose -- Insufficient frequency information            Patient benefited maximally from hospital stay and there were no complications.    Recent vital signs: Patient Vitals for the past 24 hrs:  BP Temp Temp src Pulse Resp SpO2  01/03/24 0752 120/65 98.4 F (36.9 C) Oral 87 16 94 %  01/03/24 0700 127/61 98.8 F (37.1 C) Axillary 89 18 95 %  01/02/24 2036 131/66 98.6 F (37 C) Oral 98 17 92 %  01/02/24 1630 (!) 106/58 98.4 F (36.9 C) Oral 89 16 91 %  01/02/24 0946 123/69 -- -- 90 17 91 %     Recent laboratory studies: No results for input(s): WBC, HGB, HCT, PLT, NA, K, CL, CO2, BUN, CREATININE, GLUCOSE,  INR, CALCIUM  in the last 72 hours.  Invalid input(s): PT, 2   Discharge Medications:   Allergies as of 01/03/2024       Reactions   Morphine  Itching   Pravastatin  Sodium Other (See Comments)   Severe muscle cramps with one time requiring admission.         Medication List     STOP taking these medications    acetaminophen  325 MG tablet Commonly known as: TYLENOL    aspirin  EC 81 MG tablet Replaced by: Aspirin  Low Dose 81 MG chewable tablet   colchicine  0.6 MG tablet   cyclobenzaprine  5 MG tablet Commonly known as: FLEXERIL    loperamide  2 MG capsule Commonly known as: IMODIUM    oxyCODONE -acetaminophen  10-325 MG tablet Commonly known as: PERCOCET Replaced by: oxyCODONE -acetaminophen  5-325 MG tablet       TAKE these medications    Accu-Chek Guide test strip Generic drug: glucose blood Check once a day   Accu-Chek Softclix Lancets lancets Check once a day   allopurinol  100 MG tablet Commonly known as: ZYLOPRIM  Take 100 mg by mouth 2 (two) times daily.   amLODipine  10 MG tablet Commonly known as: NORVASC  Take 10 mg by mouth daily.   Aspirin  Low Dose 81 MG chewable tablet Generic drug: aspirin  Chew 1 tablet (81 mg total) by mouth 2 (two) times daily. To be taken after surgery to prevent blood clots Replaces: aspirin  EC 81 MG tablet   benzonatate  100 MG capsule Commonly known as: TESSALON  Take 1 capsule (100 mg total) by mouth every 8 (eight) hours.   Betasept  Surgical Scrub 4 % external liquid Generic drug: chlorhexidine  Apply 15 mLs (1 Application total) topically as directed for 30 doses. Use as directed daily for 5 days every other week for 6 weeks.   carvedilol  6.25 MG tablet Commonly known as: COREG  Take 6.25 mg by mouth 2 (two) times daily with a meal.   diclofenac  Sodium 1 % Gel Commonly known as: VOLTAREN  Apply 2 g topically 4 (four) times daily. What changed:  how much to take when to take this reasons to take this    docusate sodium  100 MG capsule Commonly known as: Colace Take 1 capsule (100 mg total) by mouth daily as needed.   doxycycline  100 MG capsule Commonly known as: VIBRAMYCIN  Take 1 capsule (100 mg total) by mouth 2 (two) times daily. To be taken after surgery   gabapentin  100 MG capsule Commonly known as: NEURONTIN  Take 2 capsules (200 mg total) by mouth 2 (two) times daily.   insulin  glargine 100 UNIT/ML Solostar Pen Commonly known as: LANTUS   Inject 15 Units into the skin daily.   Insulin  Pen Needle 32G X 4 MM Misc Use to inject victoza  one time a day   methocarbamol  750 MG tablet Commonly known as: ROBAXIN  Take 1 tablet (750 mg total) by mouth 3 (three) times daily as needed.   mupirocin  ointment 2 % Commonly known as: BACTROBAN  Place 1 Application into the nose 2 (two) times daily for 60 doses. Use as directed 2 times daily for 5 days every other week for 6 weeks.   nitroGLYCERIN  0.4 MG SL tablet Commonly known as: NITROSTAT  DISSOLVE 1 TABLET UNDER THE TONGUE EVERY 5 MINUTES AS  NEEDED FOR CHEST PAIN , MAX 3 TABLETS IN 15 MINUTES,  CALL 911 IF NO RELIEF.   ondansetron  4 MG tablet Commonly known as: Zofran  Take 1 tablet (4 mg total) by mouth every 8 (eight) hours as needed for nausea or vomiting.   oxyCODONE -acetaminophen  5-325 MG tablet Commonly known as: Percocet Take 1-2 tablets by mouth every 6 (six) hours as needed for severe pain (pain score 7-10). Replaces: oxyCODONE -acetaminophen  10-325 MG tablet   rosuvastatin  10 MG tablet Commonly known as: CRESTOR  Take 1 tablet (10 mg total) by mouth daily.   valsartan  160 MG tablet Commonly known as: DIOVAN  Take 160 mg by mouth daily.               Durable Medical Equipment  (From admission, onward)           Start     Ordered   01/01/24 1751  DME Walker rolling  Once       Question:  Patient needs a walker to treat with the following condition  Answer:  History of hip replacement   01/01/24 1750    01/01/24 1751  DME 3 n 1  Once        01/01/24 1750   01/01/24 1751  DME Bedside commode  Once       Question:  Patient needs a bedside commode to treat with the following condition  Answer:  History of hip replacement   01/01/24 1750            Diagnostic Studies: DG Pelvis Portable Result Date: 01/01/2024 CLINICAL DATA:  Postop. EXAM: PORTABLE PELVIS 1-2 VIEWS COMPARISON:  Preoperative imaging. FINDINGS: Right hip arthroplasty in expected alignment. No periprosthetic lucency or fracture. Recent postsurgical change includes air and edema in the soft tissues. IMPRESSION: Right hip arthroplasty without immediate postoperative complication. Electronically Signed   By: Andrea Gasman M.D.   On: 01/01/2024 16:09   DG HIP UNILAT WITH PELVIS 1V RIGHT Result Date: 01/01/2024 CLINICAL DATA:  Elective surgery. EXAM: DG HIP (WITH OR WITHOUT PELVIS) 1V RIGHT COMPARISON:  None Available. FINDINGS: Nine fluoroscopic spot views of the pelvis and right hip obtained in the operating room. Sequential images during hip arthroplasty. Fluoroscopy time 31.6 seconds. Dose 4.19 mGy. IMPRESSION: Intraoperative fluoroscopy during right hip arthroplasty. Electronically Signed   By: Andrea Gasman M.D.   On: 01/01/2024 16:08   DG C-Arm 1-60 Min-No Report Result Date: 01/01/2024 Fluoroscopy was utilized by the requesting physician.  No radiographic interpretation.   ECHOCARDIOGRAM COMPLETE Result Date: 12/26/2023    ECHOCARDIOGRAM REPORT   Patient Name:   Daniel Perkins Date of Exam: 12/26/2023 Medical Rec #:  [id number]          Height:       68.0 in Accession #:    [id number]         Weight:  214.0 lb Date of Birth:  1955/12/27           BSA:          2.103 m Patient Age:    68 years           BP:           120/70 mmHg Patient Gender: M                  HR:           69 bpm. Exam Location:  [address] Procedure: 2D Echo, 3D Echo, Cardiac Doppler and Color Doppler (Both Spectral            and Color Flow  Doppler were utilized during procedure). 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 CAD and Previous Myocardial Infarction, PAD,                 Mitral Valve Disease, Signs/Symptoms:Dyspnea; Risk                 Factors:Hypertension, Diabetes, Dyslipidemia, Family History of                 Coronary Artery Disease and Former Smoker. Pre-Operative Eval                 for Right Hip Replacement, Obesity.  Sonographer:    Heather Hawks RDCS Referring Phys: MADISON L FOUNTAIN IMPRESSIONS  1. Left ventricular ejection fraction, by estimation, is 65 to 70%. Left ventricular ejection fraction by 3D volume is 68 %. The left ventricle has normal function. The left ventricle has no regional wall motion abnormalities. Left ventricular diastolic  parameters are indeterminate.  2. Right ventricular systolic function is normal. The right ventricular size is normal.  3. The mitral valve is normal in structure. Mild mitral valve regurgitation. No evidence of mitral stenosis.  4. The aortic valve is calcified. There is moderate calcification of the aortic valve. There is moderate thickening of the aortic valve. Aortic valve regurgitation is mild. Aortic valve sclerosis/calcification is present, without any evidence of aortic stenosis. Aortic regurgitation PHT measures 493 msec. Aortic valve area, by VTI measures 2.58 cm. Aortic valve mean gradient measures 8.0 mmHg. Aortic valve Vmax measures 1.92 m/s.  5. The inferior vena cava is normal in size with greater than 50% respiratory variability, suggesting right atrial pressure of 3 mmHg. FINDINGS  Left Ventricle: Left ventricular ejection fraction, by estimation, is 65 to 70%. Left ventricular ejection fraction by 3D volume is 68 %. The left ventricle has normal function. The left ventricle has no regional wall motion abnormalities.  The left ventricular internal cavity size was normal in size. There is no left ventricular hypertrophy. Left ventricular diastolic parameters are indeterminate. Normal left ventricular filling pressure. Right Ventricle: The right ventricular size is normal. No increase in right ventricular wall thickness. Right ventricular systolic function is normal. Left Atrium: Left atrial size was normal in size. Right Atrium: Right atrial size was normal in size. Pericardium: There is no evidence of pericardial effusion. Mitral Valve: The mitral valve is normal in structure. Mild mitral valve regurgitation. No evidence of mitral valve stenosis. Tricuspid Valve: The tricuspid  valve is normal in structure. Tricuspid valve regurgitation is trivial. No evidence of tricuspid stenosis. Aortic Valve: The aortic valve is calcified. There is moderate calcification of the aortic valve. There is moderate thickening of the aortic valve. Aortic valve regurgitation is mild. Aortic regurgitation PHT measures 493 msec. Aortic valve sclerosis/calcification is present, without any evidence of aortic stenosis. Aortic valve mean gradient measures 8.0 mmHg. Aortic valve peak gradient measures 14.7 mmHg. Aortic valve area, by VTI measures 2.58 cm. Pulmonic Valve: The pulmonic valve was normal in structure. Pulmonic valve regurgitation is mild. No evidence of pulmonic stenosis. Aorta: The aortic root is normal in size and structure. Venous: The inferior vena cava is normal in size with greater than 50% respiratory variability, suggesting right atrial pressure of 3 mmHg. IAS/Shunts: No atrial level shunt detected by color flow Doppler. Additional Comments: 3D was performed not requiring image post processing on an independent workstation and was normal.  LEFT VENTRICLE PLAX 2D LVIDd:         4.70 cm         Diastology LVIDs:         2.10 cm         LV e' medial:    6.31 cm/s LV PW:         0.90 cm         LV E/e' medial:  14.9 LV IVS:        0.90 cm          LV e' lateral:   8.81 cm/s LVOT diam:     2.10 cm         LV E/e' lateral: 10.7 LV SV:         104 LV SV Index:   49 LVOT Area:     3.46 cm        3D Volume EF                                LV 3D EF:    Left                                             ventricul                                             ar                                             ejection                                             fraction                                             by 3D  volume is                                             68 %.                                 3D Volume EF:                                3D EF:        68 %                                LV EDV:       94 ml                                LV ESV:       30 ml                                LV SV:        64 ml RIGHT VENTRICLE RV Basal diam:  3.60 cm RV S prime:     20.30 cm/s TAPSE (M-mode): 2.8 cm RVSP:           24.3 mmHg LEFT ATRIUM             Index        RIGHT ATRIUM           Index LA diam:        4.10 cm 1.95 cm/m   RA Pressure: 3.00 mmHg LA Vol (A2C):   50.9 ml 24.20 ml/m  RA Area:     19.80 cm LA Vol (A4C):   67.3 ml 32.00 ml/m  RA Volume:   60.90 ml  28.95 ml/m LA Biplane Vol: 61.3 ml 29.14 ml/m  AORTIC VALVE AV Area (Vmax):    2.69 cm AV Area (Vmean):   2.65 cm AV Area (VTI):     2.58 cm AV Vmax:           191.60 cm/s AV Vmean:          130.400 cm/s AV VTI:            0.402 m AV Peak Grad:      14.7 mmHg AV Mean Grad:      8.0 mmHg LVOT Vmax:         148.60 cm/s LVOT Vmean:        99.720 cm/s LVOT VTI:          0.300 m LVOT/AV VTI ratio: 0.75 AI PHT:            493 msec  AORTA Ao Root diam: 3.10 cm Ao Asc diam:  3.40 cm MITRAL VALVE               TRICUSPID VALVE MV Area (PHT): cm         TR Peak grad:   21.3 mmHg MV Decel Time: 206 msec    TR Vmax:        231.00 cm/s MR Peak grad: 102.4 mmHg  Estimated RAP:  3.00 mmHg MR Mean grad: 63.0 mmHg    RVSP:           24.3 mmHg MR Vmax:       506.00 cm/s MR Vmean:     375.0 cm/s   SHUNTS MV E velocity: 94.10 cm/s  Systemic VTI:  0.30 m MV A velocity: 98.20 cm/s  Systemic Diam: 2.10 cm MV E/A ratio:  0.96 Wilbert Bihari MD Electronically signed by Wilbert Bihari MD Signature Date/Time: 12/26/2023/9:44:08 AM    Final     Disposition: Discharge disposition: 01-Home or Self Care          Follow-up Information     Jule Ronal CROME, PA-C. Schedule an appointment as soon as possible for a visit in 2 week(s).   Specialty: Orthopedic Surgery Contact information: [address] [place] [address] [phone]                  Signed: Ronal CROME Jule 01/03/2024, 8:14 AM

## 2024-01-03 NOTE — Progress Notes (Signed)
 Subjective: 2 Days Post-Op Procedure(s) (LRB): ARTHROPLASTY, HIP, TOTAL, ANTERIOR APPROACH (Right) Patient reports pain as mild.    Objective: Vital signs in last 24 hours: Temp:  [98.4 F (36.9 C)-98.8 F (37.1 C)] 98.4 F (36.9 C) (08/27 0752) Pulse Rate:  [87-98] 87 (08/27 0752) Resp:  [16-18] 16 (08/27 0752) BP: (106-131)/(58-69) 120/65 (08/27 0752) SpO2:  [91 %-95 %] 94 % (08/27 0752)  Intake/Output from previous day: 08/26 0701 - 08/27 0700 In: 1733.3 [P.O.:820; I.V.:913.3] Out: 1625 [Urine:1625] Intake/Output this shift: No intake/output data recorded.  No results for input(s): HGB in the last 72 hours. No results for input(s): WBC, RBC, HCT, PLT in the last 72 hours. No results for input(s): NA, K, CL, CO2, BUN, CREATININE, GLUCOSE, CALCIUM  in the last 72 hours. No results for input(s): LABPT, INR in the last 72 hours.  Neurologically intact Neurovascular intact Sensation intact distally Intact pulses distally Dorsiflexion/Plantar flexion intact Incision: dressing C/D/I No cellulitis present Compartment soft   Assessment/Plan: 2 Days Post-Op Procedure(s) (LRB): ARTHROPLASTY, HIP, TOTAL, ANTERIOR APPROACH (Right) Advance diet Up with therapy D/C IV fluids Discharge home with home health ONCE CLEARED BY PT WBAT RLE Post-op meds sent to pharmacy last week.  Patient has not picked these up yet.      Ronal LITTIE Grave 01/03/2024, 8:13 AM

## 2024-01-03 NOTE — Progress Notes (Signed)
 Physical Therapy Treatment Patient Details Name: Daniel Perkins MRN: [id number] DOB: [birth year] Today's Date: 01/03/2024   History of Present Illness 68 yo male presents to [facility] on 01/01/24 for R anterior THA due to OA. 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  Pt initially reported increased pain but once ambulating reports tolerable and demonstrated decreased signs of pain with transfers.  He ambulated 120' with RW and supervision and performed stairs similar to home setup with less effort than am session.  Sister was present and educated on guarding/assisting but also has her own physical limitations - discussed having friends/neighbors assist if needed particularly with stairs.  Pt demonstrates safe gait & transfers in order to return home from PT perspective once discharged by MD.  While in hospital, will continue to benefit from PT for skilled therapy to advance mobility and exercises.       If plan is discharge home, recommend the following: A little help with walking and/or transfers;A little help with bathing/dressing/bathroom;Assist for transportation;Help with stairs or ramp for entrance   Can travel by private vehicle        Equipment Recommendations  Rolling walker (2 wheels);BSC/3in1    Recommendations for Other Services       Precautions / Restrictions Precautions Precautions: Fall Restrictions RLE Weight Bearing Per Provider Order: Weight bearing as tolerated     Mobility  Bed Mobility Overal bed mobility: Needs Assistance Bed Mobility: Supine to Sit     Supine to sit: Min assist, Used rails, HOB elevated     General bed mobility comments: in chair    Transfers Overall transfer level: Needs assistance Equipment used: Rolling walker (2 wheels) Transfers: Sit to/from Stand Sit to Stand: Supervision           General transfer comment: Close supervision and cues to extend R  leg with sitting    Ambulation/Gait Ambulation/Gait assistance: Supervision Gait Distance (Feet): 120 Feet Assistive device: Rolling walker (2 wheels) Gait Pattern/deviations: Step-to pattern, Antalgic, Decreased weight shift to right Gait velocity: decreased but functional     General Gait Details: Close supervision but no assist needed; gait pattern improved thorughout walk   Stairs Stairs: Yes Stairs assistance: Contact guard assist Stair Management: Step to pattern, Forwards, With cane Number of Stairs: 6 General stair comments: Used cane L hand and reached forward on R rail to simulate post.  Pt performed 3 steps x 2 with CGA for safety.  Pt performed with improved stability and decreased effort this afternoon.  Did educate sister on assisting and getting RW in house, but sister also with phsycial limitations -discussed having a neighbor or friend assist with steps if needed.   Wheelchair Mobility     Tilt Bed    Modified Rankin (Stroke Patients Only)       Balance Overall balance assessment: Needs assistance Sitting-balance support: No upper extremity supported, Feet supported Sitting balance-Leahy Scale: Good     Standing balance support: Single extremity supported, Reliant on assistive device for balance Standing balance-Leahy Scale: Poor Standing balance comment: RW to ambulate, steady with cane with transition to stairs                            Communication    Cognition Arousal: Alert Behavior During Therapy: [facility] for tasks assessed/performed   PT - Cognitive  impairments: No apparent impairments                                Cueing    Exercises Total Joint Exercises Ankle Circles/Pumps: AROM, Both, 10 reps, Seated Quad Sets: AROM, Both, 10 reps, Supine Heel Slides: AAROM, Right, 10 reps, Supine Hip ABduction/ADduction: AAROM, Right, 10 reps, Supine Long Arc Quad: AROM, Right, Seated, 10 reps Other Exercises Other  Exercises: Educated on positioning and AAROM technique with exercises.  Gave HEP but recommended holding on standing exercises until starts HHPT.    General Comments General comments (skin integrity, edema, etc.): VSS.  Sister present throughout session.  Sister has her own physical limitations.  Discussed having help with stairs if needed from friend or neighbor.  Pt and sister comfortable with his pain control and level of mobility in order to return home.  Pt does have HHPT ordered.  Educated on safe ice use, no pivots, car transfers, and TED hose during day. Also, encouraged walking every 1-2 hours during day for 5-10 mins. Educated on HEP with focus on mobility the first week. Discussed doing exercises within pain control and if pain increasing could decreased ROM, reps, and stop exercises as needed. Encouraged to perform ankle pumps frequently for blood flow.       Pertinent Vitals/Pain Pain Assessment Pain Assessment: 0-10 Pain Score: 6  Pain Location: R hip Pain Descriptors / Indicators: Discomfort, Grimacing, Sharp Pain Intervention(s): Limited activity within patient's tolerance, Monitored during session, Premedicated before session, Repositioned, Ice applied    Home Living                          Prior Function            PT Goals (current goals can now be found in the care plan section) Progress towards PT goals: Progressing toward goals    Frequency    7X/week      PT Plan      Co-evaluation              AM-PAC PT 6 Clicks Mobility   Outcome Measure  Help needed turning from your back to your side while in a flat bed without using bedrails?: A Little Help needed moving from lying on your back to sitting on the side of a flat bed without using bedrails?: A Little Help needed moving to and from a bed to a chair (including a wheelchair)?: A Little Help needed standing up from a chair using your arms (e.g., wheelchair or bedside chair)?: A  Little Help needed to walk in hospital room?: A Little Help needed climbing 3-5 steps with a railing? : A Little 6 Click Score: 18    End of Session Equipment Utilized During Treatment: Gait belt Activity Tolerance: Patient tolerated treatment well Patient left: with call bell/phone within reach;in chair Nurse Communication: Mobility status PT Visit Diagnosis: Pain;Difficulty in walking, not elsewhere classified (R26.2) Pain - Right/Left: Right Pain - part of body: Hip     Time: 1450-1521 PT Time Calculation (min) (ACUTE ONLY): 31 min  Charges:    $Gait Training: 8-22 mins $Therapeutic Exercise: 8-22 mins PT General Charges $$ ACUTE PT VISIT: 1 Visit                     Benjiman, PT Acute Re[facility] Services [facility] [phone]    Benjiman VEAR Mulberry 01/03/2024,  3:28 PM

## 2024-01-03 NOTE — Plan of Care (Signed)
  Problem: Pain Managment: Goal: General experience of comfort will improve and/or be controlled Outcome: Progressing   Problem: Safety: Goal: Ability to remain free from injury will improve Outcome: Progressing

## 2024-01-03 NOTE — Telephone Encounter (Signed)
 Spoke to nurse

## 2024-01-03 NOTE — Telephone Encounter (Signed)
 Pt is supposed to be discharged from hospital today, but blood sugar reading is 302 and he has no coverage. Please call Avelina, RN at [phone] with what they need to do.

## 2024-01-03 NOTE — Progress Notes (Signed)
 Physical Therapy Treatment Patient Details Name: Daniel Perkins MRN: [id number] DOB: 1956-03-01 Today's Date: 01/03/2024   History of Present Illness 68 yo male presents to [facility] on 01/01/24 for R anterior THA due to OA. 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  Does need light min A with transfers , adls, and stairs.  He ambulated 60'x2 with seated rest break and performed steps with cane and min A.  Sister coming this afternoon - would benefit from transfer training and stair training with sister assisting.  Plan to f/u in afternoon.     If plan is discharge home, recommend the following: A little help with walking and/or transfers;A little help with bathing/dressing/bathroom;Assist for transportation;Help with stairs or ramp for entrance   Can travel by private vehicle        Equipment Recommendations  Rolling walker (2 wheels);BSC/3in1    Recommendations for Other Services       Precautions / Restrictions Precautions Precautions: Fall Restrictions RLE Weight Bearing Per Provider Order: Weight bearing as tolerated     Mobility  Bed Mobility Overal bed mobility: Needs Assistance Bed Mobility: Supine to Sit     Supine to sit: Min assist, Used rails, HOB elevated     General bed mobility comments: Min A for R LE    Transfers Overall transfer level: Needs assistance Equipment used: Rolling walker (2 wheels) Transfers: Sit to/from Stand Sit to Stand: Min assist           General transfer comment: Use of momentum and min A to stabilize - slight posterior bias initially; cues for  R LE management with sitting    Ambulation/Gait Ambulation/Gait assistance: Contact guard assist Gait Distance (Feet): 60 Feet (60'x2) Assistive device: Rolling walker (2 wheels) Gait Pattern/deviations: Step-to pattern, Antalgic, Decreased weight shift to right Gait velocity: decreased     General Gait  Details: Min cues RW proximity; ambulated 60'x2 with seated rest break   Stairs Stairs: Yes Stairs assistance: Min assist   Number of Stairs: 6 General stair comments: Started 3 steps up/down with cane L and Reaching up to rail to simulate step post - required min A to stabilize.  Then tried 3 steps backward with RW and min A.  Pt preferred forward with cane.  Needs to practice with sister assisting   Wheelchair Mobility     Tilt Bed    Modified Rankin (Stroke Patients Only)       Balance Overall balance assessment: Needs assistance Sitting-balance support: No upper extremity supported, Feet supported Sitting balance-Leahy Scale: Good     Standing balance support: Bilateral upper extremity supported, During functional activity, Reliant on assistive device for balance Standing balance-Leahy Scale: Poor Standing balance comment: RW to ambulate; when pt removed hands to pull up pants and posterior bias requiring min A                            Communication    Cognition Arousal: Alert Behavior During Therapy: WFL for tasks assessed/performed   PT - Cognitive impairments: No apparent impairments                                Cueing    Exercises Total Joint Exercises Ankle Circles/Pumps: AROM, Both, 10 reps, Seated    General  Comments        Pertinent Vitals/Pain Pain Assessment Pain Assessment: 0-10 Pain Score: 6  Pain Location: R hip Pain Descriptors / Indicators: Discomfort, Grimacing, Sharp Pain Intervention(s): Limited activity within patient's tolerance, Monitored during session, Repositioned, Ice applied (Pt appears in significant pain with STS; last meds around 2 am; RN in at end of session with meds-pt request to wait a little while for meds closer to pm therapy session)    Home Living                          Prior Function            PT Goals (current goals can now be found in the care plan section) Progress  towards PT goals: Progressing toward goals    Frequency    7X/week      PT Plan      Co-evaluation              AM-PAC PT 6 Clicks Mobility   Outcome Measure  Help needed turning from your back to your side while in a flat bed without using bedrails?: A Little Help needed moving from lying on your back to sitting on the side of a flat bed without using bedrails?: A Little Help needed moving to and from a bed to a chair (including a wheelchair)?: A Little Help needed standing up from a chair using your arms (e.g., wheelchair or bedside chair)?: A Little Help needed to walk in hospital room?: A Little Help needed climbing 3-5 steps with a railing? : A Little 6 Click Score: 18    End of Session Equipment Utilized During Treatment: Gait belt Activity Tolerance: Patient tolerated treatment well Patient left: with call bell/phone within reach;in chair Nurse Communication: Mobility status PT Visit Diagnosis: Pain;Difficulty in walking, not elsewhere classified (R26.2) Pain - Right/Left: Right Pain - part of body: Hip     Time: 1020-1057 PT Time Calculation (min) (ACUTE ONLY): 37 min  Charges:    $Gait Training: 8-22 mins $Therapeutic Exercise: 8-22 mins PT General Charges $$ ACUTE PT VISIT: 1 Visit                     Daniel, PT Acute Rehab Services [facility] [phone]    Daniel Perkins 01/03/2024, 11:11 AM

## 2024-01-03 NOTE — Plan of Care (Signed)
  Problem: Education: Goal: Knowledge of General Education information will improve Description: Including pain rating scale, medication(s)/side effects and non-pharmacologic comfort measures Outcome: Adequate for Discharge   Problem: Health Behavior/Discharge Planning: Goal: Ability to manage health-related needs will improve Outcome: Adequate for Discharge   Problem: Clinical Measurements: Goal: Ability to maintain clinical measurements within normal limits will improve Outcome: Adequate for Discharge Goal: Will remain free from infection Outcome: Adequate for Discharge Goal: Diagnostic test results will improve Outcome: Adequate for Discharge Goal: Respiratory complications will improve Outcome: Adequate for Discharge Goal: Cardiovascular complication will be avoided Outcome: Adequate for Discharge   Problem: Activity: Goal: Risk for activity intolerance will decrease Outcome: Adequate for Discharge   Problem: Nutrition: Goal: Adequate nutrition will be maintained Outcome: Adequate for Discharge   Problem: Coping: Goal: Level of anxiety will decrease Outcome: Adequate for Discharge   Problem: Elimination: Goal: Will not experience complications related to bowel motility Outcome: Adequate for Discharge Goal: Will not experience complications related to urinary retention Outcome: Adequate for Discharge   Problem: Pain Managment: Goal: General experience of comfort will improve and/or be controlled Outcome: Adequate for Discharge   Problem: Safety: Goal: Ability to remain free from injury will improve Outcome: Adequate for Discharge   Problem: Skin Integrity: Goal: Risk for impaired skin integrity will decrease Outcome: Adequate for Discharge   Problem: Education: Goal: Knowledge of the prescribed therapeutic regimen will improve Outcome: Adequate for Discharge Goal: Understanding of discharge needs will improve Outcome: Adequate for Discharge Goal:  Individualized Educational Video(s) Outcome: Adequate for Discharge   Problem: Activity: Goal: Ability to avoid complications of mobility impairment will improve Outcome: Adequate for Discharge Goal: Ability to tolerate increased activity will improve Outcome: Adequate for Discharge   Problem: Clinical Measurements: Goal: Postoperative complications will be avoided or minimized Outcome: Adequate for Discharge   Problem: Pain Management: Goal: Pain level will decrease with appropriate interventions Outcome: Adequate for Discharge   Problem: Skin Integrity: Goal: Will show signs of wound healing Outcome: Adequate for Discharge   Problem: Education: Goal: Ability to describe self-care measures that may prevent or decrease complications (Diabetes Survival Skills Education) will improve Outcome: Adequate for Discharge Goal: Individualized Educational Video(s) Outcome: Adequate for Discharge   Problem: Coping: Goal: Ability to adjust to condition or change in health will improve Outcome: Adequate for Discharge   Problem: Fluid Volume: Goal: Ability to maintain a balanced intake and output will improve Outcome: Adequate for Discharge   Problem: Health Behavior/Discharge Planning: Goal: Ability to identify and utilize available resources and services will improve Outcome: Adequate for Discharge Goal: Ability to manage health-related needs will improve Outcome: Adequate for Discharge   Problem: Metabolic: Goal: Ability to maintain appropriate glucose levels will improve Outcome: Adequate for Discharge   Problem: Nutritional: Goal: Maintenance of adequate nutrition will improve Outcome: Adequate for Discharge Goal: Progress toward achieving an optimal weight will improve Outcome: Adequate for Discharge   Problem: Skin Integrity: Goal: Risk for impaired skin integrity will decrease Outcome: Adequate for Discharge   Problem: Tissue Perfusion: Goal: Adequacy of tissue  perfusion will improve Outcome: Adequate for Discharge

## 2024-01-03 NOTE — Progress Notes (Signed)
 Discharge Delay: patients blood sugar is currently 302; No orders in chart for insulin . Charge RN concated provider EMERSON Grave to informed of blood sugar level and obtain orders for insulin . Message left for provider; RN waiting response.   Reviewed AVS, patient expressed understanding of medications, MD follow up reviewed.   See LDA for information on wounds at discharge.   Patient informed and expressed understanding where to pick up discharge medications.   Patient states his ride will pick him up around 2:30 pm; has agreed to go to discharge lounge once delay is cleared.

## 2024-01-11 ENCOUNTER — Telehealth: Payer: Self-pay

## 2024-01-11 NOTE — Telephone Encounter (Signed)
 Patient called stating that he hasn't had a bowel movement since 12/31/23 the day before his surgery on 01/01/24. He is on Oxycodone  and wants to know if he should remain on it or can he stop it to take something for his bowels to start working. His PCP told him to contact our office with these questions.  Please call and advise  [phone]

## 2024-01-11 NOTE — Telephone Encounter (Signed)
 Tried to call patient. No answer. Voicemail isn't set up yet.

## 2024-01-12 NOTE — Telephone Encounter (Signed)
 Ok, thanks.  Wean off pain meds as much as possible.  Drink plenty of fluids like water/prune juice.  Definitely pick up a bottle of mag citrate from the pharmacy

## 2024-01-12 NOTE — Telephone Encounter (Signed)
 Called and spoke with patient. Relayed information. He states understanding.

## 2024-01-16 ENCOUNTER — Encounter: Payer: Medicare (Managed Care) | Admitting: Physician Assistant

## 2024-01-19 ENCOUNTER — Ambulatory Visit (INDEPENDENT_AMBULATORY_CARE_PROVIDER_SITE_OTHER): Payer: Medicare (Managed Care) | Admitting: Physician Assistant

## 2024-01-19 DIAGNOSIS — Z96641 Presence of right artificial hip joint: Secondary | ICD-10-CM

## 2024-01-19 NOTE — Progress Notes (Signed)
 Post-Op Visit Note   Patient: Daniel Perkins           Date of Birth: [birth year]           MRN: [id number] Visit Date: 01/19/2024 PCP: Maree Leni Edyth DELENA, MD   Assessment & Plan:  Chief Complaint:  Chief Complaint  Patient presents with   Right Hip - Pain   Visit Diagnoses:  1. Status post total replacement of right hip     Plan: Patient is a pleasant 68 year old gentleman who comes in today 2 weeks status post right total hip replacement 01/01/2024.  He has been doing well.  He notes some discomfort which is relieved with oxycodone .  He has been compliant taking his aspirin  twice daily for DVT prophylaxis.  He has been getting home health physical therapy twice a week and is currently ambulating with a walker.  He tells me he will be transitioning to a cane soon.  Examination of his right hip reveals a well-healed surgical incision with nylon sutures in place.  No evidence of infection or cellulitis.  Calves are soft and nontender.  He is neurovascularly intact distally.  Today, sutures were removed and Steri-Strips applied.  He does not currently need any refills on medication.  He will continue with home health PT and let me know if he needs to have this extended.  He will continue with his baby aspirin  twice daily for another 4 weeks.  He will follow-up in 4 weeks for repeat evaluation and AP pelvis x-rays.  Call with concerns or questions.  Follow-Up Instructions: Return in about 4 weeks (around 02/16/2024).   Orders:  No orders of the defined types were placed in this encounter.  No orders of the defined types were placed in this encounter.   Imaging: No new imaging  PMFS History: Patient Active Problem List   Diagnosis Date Noted   Status post total replacement of right hip 01/01/2024   Primary osteoarthritis of right hip 12/31/2023   Hx SBO 04/12/2021   Wound dehiscence, surgical 04/12/2021   Essential hypertension    Diabetes mellitus type 2 in nonobese (HCC)     Debility 03/25/2021   Malnutrition of moderate degree 03/11/2021   Left hand pain 06/22/2020   Back pain 05/20/2020   Neck pain 08/27/2019   CKD (chronic kidney disease), 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   Hepatic steatosis 05/01/2019   Prostate hypertrophy 12/06/2018   Sleep apnea-like behavior 10/01/2014   Peripheral vascular disease (HCC) 02/10/2014   Microcytic anemia 02/06/2013   CTEV (congenital talipes equinovarus)    Statin intolerance 06/29/2012   Esophageal reflux 06/29/2012   Health care maintenance 06/28/2012   Gout 06/12/2012   Obesity, Class II, BMI 35-39.9 02/20/2012   DOE (dyspnea on exertion) 02/20/2012   Diabetes mellitus due to underlying condition, uncontrolled, with hyperglycemia (HCC) 09/06/2009   ERECTILE DYSFUNCTION, NON-ORGANIC 02/16/2009   Hyperlipemia 01/14/2009   HYPERTENSION 01/14/2009   Past Medical History:  Diagnosis Date   Arthritis    Club foot of both lower extremities 1958   Coronary artery disease    Holter monitor 02/2012 - sinus with rare PVC   CTEV (congenital talipes equinovarus)    Gout    Hyperlipidemia    Hypertension    Myocardial infarction (HCC) ~ 2007   mild   Osteomyelitis (HCC)    Pneumonia 2000's   couple times   Pneumonia due to COVID-19 virus 04/28/2019   Type II diabetes mellitus (HCC) 2011    Family  History  Problem Relation Age of Onset   Diabetes Mother    Coronary artery disease Mother        HAS PACEMAKER   Heart disease Mother    Heart attack Brother    Lung disease Neg Hx     Past Surgical History:  Procedure Laterality Date   ANTERIOR CERVICAL DECOMP/DISCECTOMY FUSION  X 2   CARDIAC CATHETERIZATION  07/2009   CLOSED REDUCTION NASAL FRACTURE     when 68 years old   CLOSED REDUCTION NASAL FRACTURE Bilateral 09/21/2021   Procedure: CLOSED REDUCTION NASAL FRACTURE;  Surgeon: Carlie Clark, MD;  Location: [facility];  Service: ENT;  Laterality: Bilateral;   FOOT FRACTURE SURGERY Right 01/07/1989    pt a steel plate in   FOOT SURGERY Left x16   joints collapsed; keep getting infected   LAPAROSCOPY N/A 03/05/2021   Procedure: LAPAROSCOPY DIAGNOSTIC;  Surgeon: Signe Mitzie LABOR, MD;  Location: [facility] OR;  Service: General;  Laterality: N/A;   LAPAROTOMY Right 03/05/2021   Procedure: EXPLORATORY LAPAROTOMY, REPAIR SMALL BOWEL PERFORATION;  Surgeon: Signe Mitzie LABOR, MD;  Location: [facility] OR;  Service: General;  Laterality: Right;   LYSIS OF ADHESION  03/05/2021   Procedure: LYSIS OF ADHESION;  Surgeon: Signe Mitzie LABOR, MD;  Location: [facility] OR;  Service: General;;   POSTERIOR CERVICAL FUSION/FORAMINOTOMY  X 2   TOTAL HIP ARTHROPLASTY Right 01/01/2024   Procedure: ARTHROPLASTY, HIP, TOTAL, ANTERIOR APPROACH;  Surgeon: Jerri Kay HERO, MD;  Location: [facility] OR;  Service: Orthopedics;  Laterality: Right;  3-C   Social History   Occupational History    Employer: UNEMPLOYED   Occupation: unemployed  Tobacco Use   Smoking status: Former    Current packs/day: 0.00    Average packs/day: 1 pack/day for 32.0 years (32.0 ttl pk-yrs)    Types: Cigarettes    Start date: 07/06/1975    Quit date: 07/06/2007    Years since quitting: 16.5   Smokeless tobacco: Never  Vaping Use   Vaping status: Never Used  Substance and Sexual Activity   Alcohol use: Not Currently   Drug use: No   Sexual activity: Yes    Partners: Female

## 2024-02-08 ENCOUNTER — Ambulatory Visit: Payer: Medicare (Managed Care) | Admitting: Orthopedic Surgery

## 2024-02-20 ENCOUNTER — Ambulatory Visit (INDEPENDENT_AMBULATORY_CARE_PROVIDER_SITE_OTHER): Payer: Medicare (Managed Care) | Admitting: Physician Assistant

## 2024-02-20 ENCOUNTER — Encounter: Payer: Medicare (Managed Care) | Admitting: Physician Assistant

## 2024-02-20 ENCOUNTER — Other Ambulatory Visit: Payer: Self-pay

## 2024-02-20 DIAGNOSIS — Z96641 Presence of right artificial hip joint: Secondary | ICD-10-CM

## 2024-02-20 NOTE — Progress Notes (Signed)
 Post-Op Visit Note   Patient: Daniel Perkins           Date of Birth: 1955/08/10           MRN: [id number] Visit Date: 02/20/2024 PCP: Maree Leni Edyth DELENA, MD   Assessment & Plan:  Chief Complaint:  Chief Complaint  Patient presents with   Right Hip - Follow-up    Right THA 01/01/2024   Visit Diagnoses:  1. Status post total replacement of right hip     Plan: Patient is a pleasant 68 year old gentleman who comes in today 7 weeks status post right total hip replacement 01/01/2024.  He has been having posterior lateral hip pain.  No groin or anterior thigh pain.  He has been taking oxycodone  and Robaxin  for pain.  He has been taking a baby aspirin  twice daily for DVT prophylaxis.  He has been getting home health PT and is ambulating with a walker.  Examination of his right hip reveals a fully healed surgical scar without complication.  Painless hip flexion and logroll.  He is neurovascularly intact distally.  At this point, he may go back to taking his baby aspirin  once daily for which he was taking prior to surgery.  I have sent in a referral for outpatient physical therapy.  He will follow-up in 5 weeks for recheck.  Call with concerns or questions.  Follow-Up Instructions: Return in about 5 weeks (around 03/26/2024).   Orders:  Orders Placed This Encounter  Procedures   XR Pelvis 1-2 Views   Ambulatory referral to Physical Therapy   No orders of the defined types were placed in this encounter.   Imaging: No results found.  PMFS History: Patient Active Problem List   Diagnosis Date Noted   Status post total replacement of right hip 01/01/2024   Primary osteoarthritis of right hip 12/31/2023   Hx SBO 04/12/2021   Wound dehiscence, surgical 04/12/2021   Essential hypertension    Diabetes mellitus type 2 in nonobese (HCC)    Debility 03/25/2021   Malnutrition of moderate degree 03/11/2021   Left hand pain 06/22/2020   Back pain 05/20/2020   Neck pain 08/27/2019   CKD  (chronic kidney disease), 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   Hepatic steatosis 05/01/2019   Prostate hypertrophy 12/06/2018   Sleep apnea-like behavior 10/01/2014   Peripheral vascular disease (HCC) 02/10/2014   Microcytic anemia 02/06/2013   CTEV (congenital talipes equinovarus)    Statin intolerance 06/29/2012   Esophageal reflux 06/29/2012   Health care maintenance 06/28/2012   Gout 06/12/2012   Obesity, Class II, BMI 35-39.9 02/20/2012   DOE (dyspnea on exertion) 02/20/2012   Diabetes mellitus due to underlying condition, uncontrolled, with hyperglycemia (HCC) 09/06/2009   ERECTILE DYSFUNCTION, NON-ORGANIC 02/16/2009   Hyperlipemia 01/14/2009   HYPERTENSION 01/14/2009   Past Medical History:  Diagnosis Date   Arthritis    Club foot of both lower extremities 1958   Coronary artery disease    Holter monitor 02/2012 - sinus with rare PVC   CTEV (congenital talipes equinovarus)    Gout    Hyperlipidemia    Hypertension    Myocardial infarction (HCC) ~ 2007   mild   Osteomyelitis (HCC)    Pneumonia 2000's   couple times   Pneumonia due to COVID-19 virus 04/28/2019   Type II diabetes mellitus (HCC) 2011    Family History  Problem Relation Age of Onset   Diabetes Mother    Coronary artery disease Mother  HAS PACEMAKER   Heart disease Mother    Heart attack Brother    Lung disease Neg Hx     Past Surgical History:  Procedure Laterality Date   ANTERIOR CERVICAL DECOMP/DISCECTOMY FUSION  X 2   CARDIAC CATHETERIZATION  07/2009   CLOSED REDUCTION NASAL FRACTURE     when 68 years old   CLOSED REDUCTION NASAL FRACTURE Bilateral 09/21/2021   Procedure: CLOSED REDUCTION NASAL FRACTURE;  Surgeon: Carlie Clark, MD;  Location: [facility];  Service: ENT;  Laterality: Bilateral;   FOOT FRACTURE SURGERY Right 01/07/1989   pt a steel plate in   FOOT SURGERY Left x16   joints collapsed; keep getting infected   LAPAROSCOPY N/A 03/05/2021   Procedure: LAPAROSCOPY  DIAGNOSTIC;  Surgeon: Signe Mitzie LABOR, MD;  Location: [facility] OR;  Service: General;  Laterality: N/A;   LAPAROTOMY Right 03/05/2021   Procedure: EXPLORATORY LAPAROTOMY, REPAIR SMALL BOWEL PERFORATION;  Surgeon: Signe Mitzie LABOR, MD;  Location: [facility] OR;  Service: General;  Laterality: Right;   LYSIS OF ADHESION  03/05/2021   Procedure: LYSIS OF ADHESION;  Surgeon: Signe Mitzie LABOR, MD;  Location: [facility] OR;  Service: General;;   POSTERIOR CERVICAL FUSION/FORAMINOTOMY  X 2   TOTAL HIP ARTHROPLASTY Right 01/01/2024   Procedure: ARTHROPLASTY, HIP, TOTAL, ANTERIOR APPROACH;  Surgeon: Jerri Kay HERO, MD;  Location: [facility] OR;  Service: Orthopedics;  Laterality: Right;  3-C   Social History   Occupational History    Employer: UNEMPLOYED   Occupation: unemployed  Tobacco Use   Smoking status: Former    Current packs/day: 0.00    Average packs/day: 1 pack/day for 32.0 years (32.0 ttl pk-yrs)    Types: Cigarettes    Start date: 07/06/1975    Quit date: 07/06/2007    Years since quitting: 16.6   Smokeless tobacco: Never  Vaping Use   Vaping status: Never Used  Substance and Sexual Activity   Alcohol use: Not Currently   Drug use: No   Sexual activity: Yes    Partners: Female

## 2024-02-22 ENCOUNTER — Ambulatory Visit: Payer: Medicare (Managed Care)

## 2024-02-22 NOTE — Therapy (Incomplete)
 OUTPATIENT PHYSICAL THERAPY LOWER EXTREMITY EVALUATION   Patient Name: Daniel Perkins MRN: [id number] DOB:1955/06/10, 68 y.o., male Today's Date: 02/22/2024  END OF SESSION:   Past Medical History:  Diagnosis Date   Arthritis    Club foot of both lower extremities 1958   Coronary artery disease    Holter monitor 02/2012 - sinus with rare PVC   CTEV (congenital talipes equinovarus)    Gout    Hyperlipidemia    Hypertension    Myocardial infarction (HCC) ~ 2007   mild   Osteomyelitis (HCC)    Pneumonia 2000's   couple times   Pneumonia due to COVID-19 virus 04/28/2019   Type II diabetes mellitus (HCC) 2011   Past Surgical History:  Procedure Laterality Date   ANTERIOR CERVICAL DECOMP/DISCECTOMY FUSION  X 2   CARDIAC CATHETERIZATION  07/2009   CLOSED REDUCTION NASAL FRACTURE     when 68 years old   CLOSED REDUCTION NASAL FRACTURE Bilateral 09/21/2021   Procedure: CLOSED REDUCTION NASAL FRACTURE;  Surgeon: Carlie Clark, MD;  Location: [facility];  Service: ENT;  Laterality: Bilateral;   FOOT FRACTURE SURGERY Right 01/07/1989   pt a steel plate in   FOOT SURGERY Left x16   joints collapsed; keep getting infected   LAPAROSCOPY N/A 03/05/2021   Procedure: LAPAROSCOPY DIAGNOSTIC;  Surgeon: Signe Mitzie LABOR, MD;  Location: [facility] OR;  Service: General;  Laterality: N/A;   LAPAROTOMY Right 03/05/2021   Procedure: EXPLORATORY LAPAROTOMY, REPAIR SMALL BOWEL PERFORATION;  Surgeon: Signe Mitzie LABOR, MD;  Location: [facility] OR;  Service: General;  Laterality: Right;   LYSIS OF ADHESION  03/05/2021   Procedure: LYSIS OF ADHESION;  Surgeon: Signe Mitzie LABOR, MD;  Location: [facility] OR;  Service: General;;   POSTERIOR CERVICAL FUSION/FORAMINOTOMY  X 2   TOTAL HIP ARTHROPLASTY Right 01/01/2024   Procedure: ARTHROPLASTY, HIP, TOTAL, ANTERIOR APPROACH;  Surgeon: Jerri Kay HERO, MD;  Location: [facility] OR;  Service: Orthopedics;  Laterality: Right;  3-C   Patient Active Problem  List   Diagnosis Date Noted   Status post total replacement of right hip 01/01/2024   Primary osteoarthritis of right hip 12/31/2023   Hx SBO 04/12/2021   Wound dehiscence, surgical 04/12/2021   Essential hypertension    Diabetes mellitus type 2 in nonobese (HCC)    Debility 03/25/2021   Malnutrition of moderate degree 03/11/2021   Left hand pain 06/22/2020   Back pain 05/20/2020   Neck pain 08/27/2019   CKD (chronic kidney disease), 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   Hepatic steatosis 05/01/2019   Prostate hypertrophy 12/06/2018   Sleep apnea-like behavior 10/01/2014   Peripheral vascular disease (HCC) 02/10/2014   Microcytic anemia 02/06/2013   CTEV (congenital talipes equinovarus)    Statin intolerance 06/29/2012   Esophageal reflux 06/29/2012   Health care maintenance 06/28/2012   Gout 06/12/2012   Obesity, Class II, BMI 35-39.9 02/20/2012   DOE (dyspnea on exertion) 02/20/2012   Diabetes mellitus due to underlying condition, uncontrolled, with hyperglycemia (HCC) 09/06/2009   ERECTILE DYSFUNCTION, NON-ORGANIC 02/16/2009   Hyperlipemia 01/14/2009   HYPERTENSION 01/14/2009    PCP: Leni Edyth LABOR Maree, MD  REFERRING PROVIDER: Ronal LITTIE Bimler, PA-C  REFERRING DIAG: Diagnosis [phone] (ICD-10-CM) - Status post total replacement of right hip  THERAPY DIAG:  No diagnosis found.  Rationale for Evaluation and Treatment: Rehabilitation  ONSET DATE: 01/01/2024  SUBJECTIVE:   SUBJECTIVE STATEMENT: ***  PERTINENT HISTORY: *** PAIN:  Are you having pain? Yes: NPRS scale: *** Pain location: ***  Pain description: *** Aggravating factors: *** Relieving factors: ***  PRECAUTIONS: {Therapy precautions:24002}  RED FLAGS: {PT Red Flags:29287}   WEIGHT BEARING RESTRICTIONS: {Yes ***/No:24003}  FALLS:  Has patient fallen in last 6 months? {fallsyesno:27318}  LIVING ENVIRONMENT: Lives with: {[facility] lives with:25569::lives with their family} Lives in: {Lives in:25570} Stairs:  {opstairs:27293} Has following equipment at home: {Assistive devices:23999}  OCCUPATION: ***  PLOF: {PLOF:24004}  PATIENT GOALS: ***  NEXT MD VISIT: 03/26/2024   OBJECTIVE:  Note: Objective measures were completed at Evaluation unless otherwise noted.  DIAGNOSTIC FINDINGS:  IMPRESSION: Right hip arthroplasty without immediate postoperative complication.  PATIENT SURVEYS:  PSFS: THE PATIENT SPECIFIC FUNCTIONAL SCALE  Place score of 0-10 (0 = unable to perform activity and 10 = able to perform activity at the same level as before injury or problem)  Activity Date: ***         2.     3.     4.      Total Score ***      Total Score = Sum of activity scores/number of activities  Minimally Detectable Change: 3 points (for single activity); 2 points (for average score)  [facility] (nd). The Patient Specific Functional Scale . Retrieved from [email or web address]   COGNITION: Overall cognitive status: {cognition:24006}     SENSATION: {sensation:27233}  EDEMA:  {edema:24020}  MUSCLE LENGTH: Hamstrings: Right *** deg; Left *** deg Debby test: Right *** deg; Left *** deg  POSTURE: {posture:25561}  PALPATION: ***  LOWER EXTREMITY ROM:   ROM Right eval Left eval  Hip flexion    Hip extension    Hip abduction    Hip adduction    Hip internal rotation    Hip external rotation    Knee flexion    Knee extension    Ankle dorsiflexion    Ankle plantarflexion    Ankle inversion    Ankle eversion     (Blank rows = not tested)  LOWER EXTREMITY MMT:  MMT Right eval Left eval  Hip flexion    Hip extension    Hip abduction    Hip adduction    Hip internal rotation    Hip external rotation    Knee flexion    Knee extension    Ankle dorsiflexion    Ankle plantarflexion    Ankle inversion    Ankle eversion     (Blank rows = not tested)  LOWER EXTREMITY SPECIAL TESTS:   {LEspecialtests:26242}  FUNCTIONAL TESTS:  {Functional tests:24029}  GAIT: Distance walked: *** Assistive device utilized: {Assistive devices:23999} Level of assistance: {Levels of assistance:24026} Comments: ***                                                                                                                                TREATMENT DATE:  02/22/2024 TherEx:  HEP handout provided with patient performing one set of each activity. Verbal and tactile cues provided.   Self-Care:  POC,***  PATIENT EDUCATION:  Education details: *** Person educated: {Person educated:25204} Education method: {Education Method:25205} Education comprehension: {Education Comprehension:25206}  HOME EXERCISE PROGRAM: ***  ASSESSMENT:  CLINICAL IMPRESSION: Patient is a 68 y.o. M who was seen today for physical therapy evaluation and treatment for s/p Rt THA with posterolateral pain presenting with *** deficits. Patient will benefit from skilled PT to address above noted deficits.    OBJECTIVE IMPAIRMENTS: {opptimpairments:25111}.   ACTIVITY LIMITATIONS: {activitylimitations:27494}  PARTICIPATION LIMITATIONS: {participationrestrictions:25113}  PERSONAL FACTORS: {Personal factors:25162} are also affecting patient's functional outcome.   REHAB POTENTIAL: {rehabpotential:25112}  CLINICAL DECISION MAKING: {clinical decision making:25114}  EVALUATION COMPLEXITY: {Evaluation complexity:25115}   GOALS: Goals reviewed with patient? Yes  SHORT TERM GOALS: Target date: *** Patient will show compliance with initial HEP. Baseline: Goal status: INITIAL  2.  Patient will report pain levels no greater than ***/10 in order to show improved overall quality of life. Baseline:  Goal status: INITIAL    LONG TERM GOALS: Target date: ***  Patient will show independence with HEP in order to maintain and progress upon functional gains made within PT.  Baseline:  Goal status:  INITIAL  2.  Patient will report pain levels no greater than ***/10 in order to show improved overall quality of life. Baseline:  Goal status: INITIAL  3.  Patient will increase PSFS to at least *** in order to show a significant improvement in subjective disability rating. Baseline:  Goal status: INITIAL  4.  Patient will increase *** ROM to at least *** in order to improve functional mobility. Baseline:  Goal status: INITIAL  5.  Patient will increase *** MMT to at least *** in order to improve biomechanics with functional mobility.  Baseline:  Goal status: INITIAL  6.  *** Baseline:  Goal status: INITIAL   PLAN:  PT FREQUENCY: {rehab frequency:25116}  PT DURATION: {rehab duration:25117}  PLANNED INTERVENTIONS: 97164- PT Re-evaluation, 97750- Physical Performance Testing, 97110-Therapeutic exercises, 97530- Therapeutic activity, [id number]- Neuromuscular re-education, 97535- Self Care, 02859- Manual therapy, [id number]- Gait training, [phone]- Orthotic Initial, [phone]- Orthotic/Prosthetic subsequent, [phone]- Canalith repositioning, [id number]- Aquatic Therapy, H9716- Electrical stimulation (unattended), [phone]- Electrical stimulation (manual), [id number]- Vasopneumatic device, [id number]- Ultrasound, [id number]- Traction (mechanical), [id number]- Ionotophoresis 4mg /ml Dexamethasone , 79439 (1-2 muscles), 20561 (3+ muscles)- Dry Needling, Patient/Family education, Balance training, Stair training, Taping, Joint mobilization, Spinal manipulation, Spinal mobilization, Scar mobilization, Vestibular training, DME instructions, Cryotherapy, and Moist heat  PLAN FOR NEXT SESSION: ***   Susannah Daring, PT, DPT 02/22/24 8:04 AM

## 2024-02-27 ENCOUNTER — Encounter: Payer: Self-pay | Admitting: Physical Therapy

## 2024-02-27 ENCOUNTER — Ambulatory Visit: Payer: Medicare (Managed Care) | Admitting: Physical Therapy

## 2024-02-27 DIAGNOSIS — R2689 Other abnormalities of gait and mobility: Secondary | ICD-10-CM

## 2024-02-27 DIAGNOSIS — M25551 Pain in right hip: Secondary | ICD-10-CM | POA: Diagnosis not present

## 2024-02-27 DIAGNOSIS — R2681 Unsteadiness on feet: Secondary | ICD-10-CM

## 2024-02-27 DIAGNOSIS — M6281 Muscle weakness (generalized): Secondary | ICD-10-CM

## 2024-02-27 DIAGNOSIS — M25651 Stiffness of right hip, not elsewhere classified: Secondary | ICD-10-CM | POA: Diagnosis not present

## 2024-02-27 NOTE — Therapy (Signed)
 OUTPATIENT PHYSICAL THERAPY LOWER EXTREMITY EVALUATION   Patient Name: Daniel Perkins MRN: [id number] DOB:26-Jun-1955, 68 y.o., male Today's Date: 02/27/2024  END OF SESSION:  PT End of Session - 02/27/24 0939     Visit Number 1    Number of Visits 20    Date for Recertification  05/01/24    Authorization Type Medicare Advantage    Progress Note Due on Visit 10    PT Start Time 0845    PT Stop Time 0928    PT Time Calculation (min) 43 min    Equipment Utilized During Treatment Gait belt    Activity Tolerance Patient tolerated treatment well;Patient limited by pain    Behavior During Therapy WFL for tasks assessed/performed          Past Medical History:  Diagnosis Date   Arthritis    Club foot of both lower extremities 1958   Coronary artery disease    Holter monitor 02/2012 - sinus with rare PVC   CTEV (congenital talipes equinovarus)    Gout    Hyperlipidemia    Hypertension    Myocardial infarction (HCC) ~ 2007   mild   Osteomyelitis (HCC)    Pneumonia 2000's   couple times   Pneumonia due to COVID-19 virus 04/28/2019   Type II diabetes mellitus (HCC) 2011   Past Surgical History:  Procedure Laterality Date   ANTERIOR CERVICAL DECOMP/DISCECTOMY FUSION  X 2   CARDIAC CATHETERIZATION  07/2009   CLOSED REDUCTION NASAL FRACTURE     when 68 years old   CLOSED REDUCTION NASAL FRACTURE Bilateral 09/21/2021   Procedure: CLOSED REDUCTION NASAL FRACTURE;  Surgeon: Carlie Clark, MD;  Location: [facility];  Service: ENT;  Laterality: Bilateral;   FOOT FRACTURE SURGERY Right 01/07/1989   pt a steel plate in   FOOT SURGERY Left x16   joints collapsed; keep getting infected   LAPAROSCOPY N/A 03/05/2021   Procedure: LAPAROSCOPY DIAGNOSTIC;  Surgeon: Signe Mitzie LABOR, MD;  Location: [facility];  Service: General;  Laterality: N/A;   LAPAROTOMY Right 03/05/2021   Procedure: EXPLORATORY LAPAROTOMY, REPAIR SMALL BOWEL PERFORATION;  Surgeon: Signe Mitzie LABOR, MD;  Location: [facility] OR;  Service: General;  Laterality: Right;   LYSIS OF ADHESION  03/05/2021   Procedure: LYSIS OF ADHESION;  Surgeon: Signe Mitzie LABOR, MD;  Location: [facility] OR;  Service: General;;   POSTERIOR CERVICAL FUSION/FORAMINOTOMY  X 2   TOTAL HIP ARTHROPLASTY Right 01/01/2024   Procedure: ARTHROPLASTY, HIP, TOTAL, ANTERIOR APPROACH;  Surgeon: Jerri Kay HERO, MD;  Location: [facility] OR;  Service: Orthopedics;  Laterality: Right;  3-C   Patient Active Problem List   Diagnosis Date Noted   Status post total replacement of right hip 01/01/2024   Primary osteoarthritis of right hip 12/31/2023   Hx SBO 04/12/2021   Wound dehiscence, surgical 04/12/2021   Essential hypertension    Diabetes mellitus type 2 in nonobese (HCC)    Debility 03/25/2021   Malnutrition of moderate degree 03/11/2021   Left hand pain 06/22/2020   Back pain 05/20/2020   Neck pain 08/27/2019   CKD (chronic kidney disease), 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   Hepatic steatosis 05/01/2019   Prostate hypertrophy 12/06/2018   Sleep apnea-like behavior 10/01/2014   Peripheral vascular disease (HCC) 02/10/2014   Microcytic anemia 02/06/2013   CTEV (congenital talipes equinovarus)    Statin intolerance 06/29/2012   Esophageal reflux 06/29/2012   Health care maintenance 06/28/2012   Gout 06/12/2012   Obesity, Class II, BMI  35-39.9 02/20/2012   DOE (dyspnea on exertion) 02/20/2012   Diabetes mellitus due to underlying condition, uncontrolled, with hyperglycemia (HCC) 09/06/2009   ERECTILE DYSFUNCTION, NON-ORGANIC 02/16/2009   Hyperlipemia 01/14/2009   HYPERTENSION 01/14/2009    PCP: Maree Leni Edyth DELENA, MD  REFERRING PROVIDER: Jule Ronal CROME, PA-C  REFERRING DIAG: [phone] (ICD-10-CM) - Status post total replacement of right hip   THERAPY DIAG:  Muscle weakness (generalized)  Stiffness of right hip, not elsewhere classified  Pain in right hip  Other abnormalities of gait and mobility  Unsteadiness on  feet  Rationale for Evaluation and Treatment: Rehabilitation  ONSET DATE: 01/01/2024  right THA sg  SUBJECTIVE:   SUBJECTIVE STATEMENT: Patient underwent a right Total Hip Arthroplasty anterior approach on 01/01/2024 due to OA. HHPT until 02/26/2024. At office visit on 02/20/2024, he was still having posterior lateral hip pain.   PERTINENT HISTORY: right THA 01/01/24, OA, club foot deformities BLEs, CAD, Congenital Talipes Equinovarus, gout, HLD, HTN, MI, DM2  DIAGNOSTIC FINDINGS: post-op X-rays show no abnormalities s/p THA  PAIN:  NPRS scale: 3 - 7 /10 Pain location: right hip posterior lateral Pain description: achy Aggravating factors: sitting or laying on left side,  Relieving factors: muscle relaxer, heating pad, pain meds when really needs it  PRECAUTIONS: Anterior hip  WEIGHT BEARING RESTRICTIONS: No  FALLS:  Has patient fallen in last 6 months? No  LIVING ENVIRONMENT: Lives with: lives alone Lives in: [place] one story Stairs: Yes: External: 4 steps; can reach both Has following equipment at home: Single point cane, Environmental consultant - 2 wheeled, Wheelchair (manual), [employer], Grab bars, and Ramped entry  OCCUPATION:  retired on disability  PLOF: Independent  PATIENT GOALS:   to function with hip pain, walk well in community, clean house  Next MD visit:  03/26/2024  OBJECTIVE:   PATIENT SURVEYS:  Patient-Specific Activity Scoring Scheme  0 represents "unable to perform." 10 represents "able to perform at prior level. 0 1 2 3 4 5 6 7 8 9  10 (Date and Score)  Activity Eval     1. Walking  4     2. Sleeping  2     3.  Cleaning the house 2   4.    5.    Score 2.67    Total score = sum of the activity scores/number of activities Minimum detectable change (90%CI) for average score = 2 points Minimum detectable change (90%CI) for single activity score = 3 points  COGNITION: Overall cognitive status: WFL    SENSATION: WFL but has numbness in thigh ant-lat  (feels touch)   EDEMA:  Patient appears to have localized edema present around the right hip especially laterally.  MUSCLE LENGTH: Hamstrings: Not formally tested but appears to be tight bilaterally Debby test: Not formally tested but appears to be tight bilaterally  POSTURE:  rounded shoulders, forward head, decreased lumbar lordosis, flexed trunk , and weight shift left  PALPATION: Scar has slight adhesion with rope feeling;  tenderness to posterior lateral hip esp over Piriformis & Glut Med  LOWER EXTREMITY ROM:   ROM Right eval Left eval  Hip flexion Standing  A: 61*   Hip extension Standing  A: 1*   Hip abduction Standing  A: 2* Prior to leaning   Hip adduction    Hip internal rotation    Hip external rotation    Knee flexion    Knee extension    Ankle dorsiflexion    Ankle plantarflexion  Ankle inversion    Ankle eversion     (Blank rows = not tested)  LOWER EXTREMITY MMT:  MMT Right eval Left eval  Hip flexion 3-/5 5/5  Hip extension 3-/5 5/5  Hip abduction 3-/5 5/5  Hip adduction    Hip internal rotation    Hip external rotation    Knee flexion 4/5 5/5  Knee extension 4/5 5/5  Ankle dorsiflexion    Ankle plantarflexion    Ankle inversion    Ankle eversion     (Blank rows = not tested)   FUNCTIONAL TESTS:  18 inch chair transfer: able to arise without UE assist but uses back of legs against chair to stabilize 5 times Sit to/from stand:  22.19 sec  Timed up and go with cane 12.69 seconds and without an assistive device 11.97 seconds with supervision.  Gait without a device patient had significantly increased gait dynamics indicating high fall risk.  GAIT: Distance walked: > 100' with cane and 20' without an assistive device Assistive device utilized: Single point cane and None Level of assistance: SBA Comments: Patient has significant antalgic gait with decreased stance duration RLE, abducted gait, step to gait pattern, early lacking  trailing limb hip extension.                                                                                                                                                                        TODAY'S TREATMENT                                                                          DATE: 02/27/2024 Therapeutic Exercise: HEP instruction/performance c cues for techniques, handout provided.  Trial set performed of each for comprehension and symptom assessment.  See below for exercise list  PATIENT EDUCATION:  Education details: HEP, POC Person educated: Patient Education method: Explanation, Demonstration, Verbal cues, and Handouts Education comprehension: verbalized understanding, returned demonstration, and verbal cues required  HOME EXERCISE PROGRAM: Access Code: [email or web address] URL: [email or web address] Date: 02/27/2024 Prepared by: Grayce Spatz  Exercises - Standing Hip Abduction: Alternating With Resistance Loop  - 1 x daily - 7 x weekly - 2 sets - 10 reps - 5 seconds hold - Hip Extension with Resistance Loop  - 1 x daily - 7 x weekly - 2 sets - 10 reps - 5 seconds hold - Standing Hip Flexion with Resistance Loop  - 1 x daily - 7 x weekly - 2 sets - 10  reps - 5 seconds hold - Standing Marching  - 1 x daily - 7 x weekly - 2 sets - 10 reps - 5 seconds hold  ASSESSMENT:  CLINICAL IMPRESSION: Patient is a 68.o. who comes to clinic with complaints of right hip pain s/p THA with mobility, strength and movement coordination deficits that impair their ability to perform usual daily and recreational functional activities without increase difficulty/symptoms at this time.  Patient to benefit from skilled PT services to address impairments and limitations to improve to previous level of function without restriction secondary to condition.   OBJECTIVE IMPAIRMENTS: Abnormal gait, decreased activity tolerance, decreased balance, decreased endurance, decreased knowledge of condition,  decreased knowledge of use of DME, decreased mobility, difficulty walking, decreased ROM, decreased strength, increased edema, increased muscle spasms, impaired flexibility, postural dysfunction, obesity, and pain.   ACTIVITY LIMITATIONS: carrying, lifting, bending, sitting, standing, squatting, sleeping, stairs, transfers, bed mobility, and locomotion level  PARTICIPATION LIMITATIONS: meal prep, cleaning, laundry, shopping, and community activity  PERSONAL FACTORS: Age, Fitness, Past/current experiences, Time since onset of injury/illness/exacerbation, and 3+ comorbidities: see PMH are also affecting patient's functional outcome.   REHAB POTENTIAL: Good  CLINICAL DECISION MAKING: Stable/uncomplicated  EVALUATION COMPLEXITY: Low   GOALS: Goals reviewed with patient? Yes  SHORT TERM GOALS: (target date for Short term goals 03/28/2024)   1.  Patient will demonstrate independent use of home exercise program to maintain progress from in clinic treatments.  Goal status: New  2.  Patient reports right hip pain <5/10 with activities including sleeping Goal status: New  LONG TERM GOALS: (target dates for all long term goals  05/01/2024 )   1. Patient will demonstrate/report pain at worst less than or equal to 2/10 to facilitate minimal limitation in daily activity secondary to pain symptoms. Goal status: New   2. Patient will demonstrate independent use of home exercise program to facilitate ability to maintain/progress functional gains from skilled physical therapy services. Goal status: New   3. Patient will demonstrate Patient specific functional scale avg > or = 6 to indicate reduced disability due to condition.  Goal status: New   4.  Patient will demonstrate right hip LE MMT 5/5 throughout to faciltiate usual transfers, stairs, squatting at [facility] for daily life.  Goal status: New   5.  Patient ambulates >500' and negotiate ramps and curbs without an assistive device  independently. Goal status: New   6.  Patient negotiates stairs with single rail alternating pattern modified independent. Goal status: New    PLAN:  PT FREQUENCY: 2x/week  PT DURATION: 10 weeks  PLANNED INTERVENTIONS: 97164- PT Re-evaluation, 97750- Physical Performance Testing, 97110-Therapeutic exercises, 97530- Therapeutic activity, [id number]- Neuromuscular re-education, 97535- Self Care, [id number]- Manual therapy, [id number]- Gait training, [phone]- Electrical stimulation (manual), [id number]- Ultrasound, [id number]- Ionotophoresis 4mg /ml Dexamethasone , 79439 (1-2 muscles), 20561 (3+ muscles)- Dry Needling, Patient/Family education, Balance training, Stair training, Taping, Scar mobilization, DME instructions, Cryotherapy, and Moist heat  PLAN FOR NEXT SESSION: Check and update HEP including adding sit to/from stand.  Manual therapy for lateral hip pain including some soft tissue scar mobilization.  Teach tennis ball for self soft tissue mobilization.   Kashira Behunin, PT, DPT 02/27/2024, 4:10 PM

## 2024-02-28 NOTE — Therapy (Signed)
 OUTPATIENT PHYSICAL THERAPY LOWER EXTREMITY TREATMENT   Patient Name: Daniel Perkins MRN: [id number] DOB:Jan 23, 1956, 68 y.o., male Today's Date: 02/29/2024  END OF SESSION:  PT End of Session - 02/29/24 0756     Visit Number 2    Number of Visits 20    Date for Recertification  05/01/24    Authorization Type Medicare Advantage    Progress Note Due on Visit 10    PT Start Time 0800    PT Stop Time 0844    PT Time Calculation (min) 44 min    Equipment Utilized During Treatment Gait belt    Activity Tolerance Patient tolerated treatment well;Patient limited by pain    Behavior During Therapy WFL for tasks assessed/performed           Past Medical History:  Diagnosis Date   Arthritis    Club foot of both lower extremities 1958   Coronary artery disease    Holter monitor 02/2012 - sinus with rare PVC   CTEV (congenital talipes equinovarus)    Gout    Hyperlipidemia    Hypertension    Myocardial infarction (HCC) ~ 2007   mild   Osteomyelitis (HCC)    Pneumonia 2000's   couple times   Pneumonia due to COVID-19 virus 04/28/2019   Type II diabetes mellitus (HCC) 2011   Past Surgical History:  Procedure Laterality Date   ANTERIOR CERVICAL DECOMP/DISCECTOMY FUSION  X 2   CARDIAC CATHETERIZATION  07/2009   CLOSED REDUCTION NASAL FRACTURE     when 68 years old   CLOSED REDUCTION NASAL FRACTURE Bilateral 09/21/2021   Procedure: CLOSED REDUCTION NASAL FRACTURE;  Surgeon: Carlie Clark, MD;  Location: [facility];  Service: ENT;  Laterality: Bilateral;   FOOT FRACTURE SURGERY Right 01/07/1989   pt a steel plate in   FOOT SURGERY Left x16   joints collapsed; keep getting infected   LAPAROSCOPY N/A 03/05/2021   Procedure: LAPAROSCOPY DIAGNOSTIC;  Surgeon: Signe Mitzie LABOR, MD;  Location: [facility] OR;  Service: General;  Laterality: N/A;   LAPAROTOMY Right 03/05/2021   Procedure: EXPLORATORY LAPAROTOMY, REPAIR SMALL BOWEL PERFORATION;  Surgeon: Signe Mitzie LABOR, MD;  Location: [facility] OR;  Service: General;  Laterality: Right;   LYSIS OF ADHESION  03/05/2021   Procedure: LYSIS OF ADHESION;  Surgeon: Signe Mitzie LABOR, MD;  Location: [facility] OR;  Service: General;;   POSTERIOR CERVICAL FUSION/FORAMINOTOMY  X 2   TOTAL HIP ARTHROPLASTY Right 01/01/2024   Procedure: ARTHROPLASTY, HIP, TOTAL, ANTERIOR APPROACH;  Surgeon: Jerri Kay HERO, MD;  Location: [facility] OR;  Service: Orthopedics;  Laterality: Right;  3-C   Patient Active Problem List   Diagnosis Date Noted   Status post total replacement of right hip 01/01/2024   Primary osteoarthritis of right hip 12/31/2023   Hx SBO 04/12/2021   Wound dehiscence, surgical 04/12/2021   Essential hypertension    Diabetes mellitus type 2 in nonobese (HCC)    Debility 03/25/2021   Malnutrition of moderate degree 03/11/2021   Left hand pain 06/22/2020   Back pain 05/20/2020   Neck pain 08/27/2019   CKD (chronic kidney disease), 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   Hepatic steatosis 05/01/2019   Prostate hypertrophy 12/06/2018   Sleep apnea-like behavior 10/01/2014   Peripheral vascular disease (HCC) 02/10/2014   Microcytic anemia 02/06/2013   CTEV (congenital talipes equinovarus)    Statin intolerance 06/29/2012   Esophageal reflux 06/29/2012   Health care maintenance 06/28/2012   Gout 06/12/2012   Obesity, Class II,  BMI 35-39.9 02/20/2012   DOE (dyspnea on exertion) 02/20/2012   Diabetes mellitus due to underlying condition, uncontrolled, with hyperglycemia (HCC) 09/06/2009   ERECTILE DYSFUNCTION, NON-ORGANIC 02/16/2009   Hyperlipemia 01/14/2009   HYPERTENSION 01/14/2009    PCP: Maree Leni Edyth DELENA, MD  REFERRING PROVIDER: Jerri Kay HERO, MD  REFERRING DIAG: [phone] (ICD-10-CM) - Status post total replacement of right hip   THERAPY DIAG:  Pain in right hip  Muscle weakness (generalized)  Stiffness of right hip, not elsewhere classified  Other abnormalities of gait and mobility  Unsteadiness on feet  Rationale  for Evaluation and Treatment: Rehabilitation  ONSET DATE: 01/01/2024  right THA sg  SUBJECTIVE:   SUBJECTIVE STATEMENT: Patient reports that his hip is hard to sit on or lay on secondary to pain.    PERTINENT HISTORY: right THA 01/01/24, OA, club foot deformities BLEs, CAD, Congenital Talipes Equinovarus, gout, HLD, HTN, MI, DM2  Patient underwent a right Total Hip Arthroplasty anterior approach on 01/01/2024 due to OA. HHPT until 02/26/2024. At office visit on 02/20/2024, he was still having posterior lateral hip pain.   DIAGNOSTIC FINDINGS: post-op X-rays show no abnormalities s/p THA  PAIN:  NPRS scale: 6.5/10 Pain location: right hip posterior lateral Pain description: achy Aggravating factors: sitting or laying on left side,  Relieving factors: muscle relaxer, heating pad, pain meds when really needs it  PRECAUTIONS: Anterior hip  WEIGHT BEARING RESTRICTIONS: No  FALLS:  Has patient fallen in last 6 months? No  LIVING ENVIRONMENT: Lives with: lives alone Lives in: [place] one story Stairs: Yes: External: 4 steps; can reach both Has following equipment at home: Single point cane, Environmental consultant - 2 wheeled, Wheelchair (manual), [employer], Grab bars, and Ramped entry  OCCUPATION:  retired on disability  PLOF: Independent  PATIENT GOALS:   to function with hip pain, walk well in community, clean house  Next MD visit:  03/26/2024  OBJECTIVE:   PATIENT SURVEYS:  Patient-Specific Activity Scoring Scheme  0 represents "unable to perform." 10 represents "able to perform at prior level. 0 1 2 3 4 5 6 7 8 9  10 (Date and Score)  Activity Eval     1. Walking  4     2. Sleeping  2     3.  Cleaning the house 2   4.    5.    Score 2.67    Total score = sum of the activity scores/number of activities Minimum detectable change (90%CI) for average score = 2 points Minimum detectable change (90%CI) for single activity score = 3 points  COGNITION: Overall cognitive status:  WFL    SENSATION: WFL but has numbness in thigh ant-lat (feels touch)   EDEMA:  Patient appears to have localized edema present around the right hip especially laterally.  MUSCLE LENGTH: Hamstrings: Not formally tested but appears to be tight bilaterally Debby test: Not formally tested but appears to be tight bilaterally  POSTURE:  rounded shoulders, forward head, decreased lumbar lordosis, flexed trunk , and weight shift left  PALPATION: Scar has slight adhesion with rope feeling;  tenderness to posterior lateral hip esp over Piriformis & Glut Med  LOWER EXTREMITY ROM:   ROM Right eval Left eval  Hip flexion Standing  A: 61*   Hip extension Standing  A: 1*   Hip abduction Standing  A: 2* Prior to leaning   Hip adduction    Hip internal rotation    Hip external rotation    Knee flexion  Knee extension    Ankle dorsiflexion    Ankle plantarflexion    Ankle inversion    Ankle eversion     (Blank rows = not tested)  LOWER EXTREMITY MMT:  MMT Right eval Left eval  Hip flexion 3-/5 5/5  Hip extension 3-/5 5/5  Hip abduction 3-/5 5/5  Hip adduction    Hip internal rotation    Hip external rotation    Knee flexion 4/5 5/5  Knee extension 4/5 5/5  Ankle dorsiflexion    Ankle plantarflexion    Ankle inversion    Ankle eversion     (Blank rows = not tested)   FUNCTIONAL TESTS:  18 inch chair transfer: able to arise without UE assist but uses back of legs against chair to stabilize 5 times Sit to/from stand:  22.19 sec  Timed up and go with cane 12.69 seconds and without an assistive device 11.97 seconds with supervision.  Gait without a device patient had significantly increased gait dynamics indicating high fall risk.  GAIT: Distance walked: > 100' with cane and 20' without an assistive device Assistive device utilized: Single point cane and None Level of assistance: SBA Comments: Patient has significant antalgic gait with decreased stance duration RLE,  abducted gait, step to gait pattern, early lacking trailing limb hip extension.                                                                                                                                                                       TODAY'S TREATMENT                                                                          DATE: 02/29/2024 TherEx:  UBE with bilat UE and LE level 2 for 8 minutes  Sit<>stands with bilat UE use 1x10  Added to HEP  PT discussed strengthening, flexibility, and pain management with patient   Manual:  STM to anterior hip for scar mobilization; switched to IASTM using cup on scar for scar mobilization  STM to posterolateral hip; educated patient on performing STM while standing with tennis ball which patient then performed   TODAY'S TREATMENT  DATE: 02/27/2024 Therapeutic Exercise: HEP instruction/performance c cues for techniques, handout provided.  Trial set performed of each for comprehension and symptom assessment.  See below for exercise list  PATIENT EDUCATION:  Education details: HEP, POC Person educated: Patient Education method: Explanation, Demonstration, Verbal cues, and Handouts Education comprehension: verbalized understanding, returned demonstration, and verbal cues required  HOME EXERCISE PROGRAM: Access Code: [email or web address] URL: [email or web address] Date: 02/29/2024 Prepared by: Susannah Daring  Exercises - Standing Hip Abduction: Alternating With Resistance Loop  - 1 x daily - 7 x weekly - 2 sets - 10 reps - 5 seconds hold - Hip Extension with Resistance Loop  - 1 x daily - 7 x weekly - 2 sets - 10 reps - 5 seconds hold - Standing Hip Flexion with Resistance Loop  - 1 x daily - 7 x weekly - 2 sets - 10 reps - 5 seconds hold - Standing Marching  - 1 x daily - 7 x weekly - 2 sets - 10 reps - 5 seconds hold - Sit to Stand with Armchair  - 1 x daily - 7 x  weekly - 3 sets - 10 reps  ASSESSMENT:  CLINICAL IMPRESSION: Patient arrived to session noting increased stiffness. Patient tolerated all activities this date and had improved scar sensitivity following cupping. Patient will continue to benefit from skilled PT.   OBJECTIVE IMPAIRMENTS: Abnormal gait, decreased activity tolerance, decreased balance, decreased endurance, decreased knowledge of condition, decreased knowledge of use of DME, decreased mobility, difficulty walking, decreased ROM, decreased strength, increased edema, increased muscle spasms, impaired flexibility, postural dysfunction, obesity, and pain.   ACTIVITY LIMITATIONS: carrying, lifting, bending, sitting, standing, squatting, sleeping, stairs, transfers, bed mobility, and locomotion level  PARTICIPATION LIMITATIONS: meal prep, cleaning, laundry, shopping, and community activity  PERSONAL FACTORS: Age, Fitness, Past/current experiences, Time since onset of injury/illness/exacerbation, and 3+ comorbidities: see PMH are also affecting patient's functional outcome.   REHAB POTENTIAL: Good  CLINICAL DECISION MAKING: Stable/uncomplicated  EVALUATION COMPLEXITY: Low   GOALS: Goals reviewed with patient? Yes  SHORT TERM GOALS: (target date for Short term goals 03/28/2024)   1.  Patient will demonstrate independent use of home exercise program to maintain progress from in clinic treatments.  Goal status: New  2.  Patient reports right hip pain <5/10 with activities including sleeping Goal status: New  LONG TERM GOALS: (target dates for all long term goals  05/01/2024 )   1. Patient will demonstrate/report pain at worst less than or equal to 2/10 to facilitate minimal limitation in daily activity secondary to pain symptoms. Goal status: New   2. Patient will demonstrate independent use of home exercise program to facilitate ability to maintain/progress functional gains from skilled physical therapy services. Goal  status: New   3. Patient will demonstrate Patient specific functional scale avg > or = 6 to indicate reduced disability due to condition.  Goal status: New   4.  Patient will demonstrate right hip LE MMT 5/5 throughout to faciltiate usual transfers, stairs, squatting at [facility] for daily life.  Goal status: New   5.  Patient ambulates >500' and negotiate ramps and curbs without an assistive device independently. Goal status: New   6.  Patient negotiates stairs with single rail alternating pattern modified independent. Goal status: New    PLAN:  PT FREQUENCY: 2x/week  PT DURATION: 10 weeks  PLANNED INTERVENTIONS: 97164- PT Re-evaluation, 97750- Physical Performance Testing, 97110-Therapeutic exercises, 97530- Therapeutic activity, [id number]- Neuromuscular re-education, 97535- Self Care, 02859- Manual therapy, [phone]- Gait training,  02967- Electrical stimulation (manual), 02964- Ultrasound, [id number]- Ionotophoresis 4mg /ml Dexamethasone , 20560 (1-2 muscles), 20561 (3+ muscles)- Dry Needling, Patient/Family education, Balance training, Stair training, Taping, Scar mobilization, DME instructions, Cryotherapy, and Moist heat  PLAN FOR NEXT SESSION: LE strengthening, review STM with tennis ball, scar mobs with cup, LE flexibility, balance    Susannah Daring, PT, DPT 02/29/24 9:12 AM

## 2024-02-29 ENCOUNTER — Ambulatory Visit: Payer: Medicare (Managed Care)

## 2024-02-29 DIAGNOSIS — M6281 Muscle weakness (generalized): Secondary | ICD-10-CM

## 2024-02-29 DIAGNOSIS — M25551 Pain in right hip: Secondary | ICD-10-CM

## 2024-02-29 DIAGNOSIS — R2689 Other abnormalities of gait and mobility: Secondary | ICD-10-CM | POA: Diagnosis not present

## 2024-02-29 DIAGNOSIS — M25651 Stiffness of right hip, not elsewhere classified: Secondary | ICD-10-CM | POA: Diagnosis not present

## 2024-02-29 DIAGNOSIS — R2681 Unsteadiness on feet: Secondary | ICD-10-CM

## 2024-03-04 NOTE — Therapy (Signed)
 OUTPATIENT PHYSICAL THERAPY LOWER EXTREMITY TREATMENT   Patient Name: Daniel Perkins MRN: [id number] DOB:04/06/1956, 68 y.o., male Today's Date: 03/05/2024  END OF SESSION:  PT End of Session - 03/05/24 0854     Visit Number 3    Number of Visits 20    Date for Recertification  05/01/24    Authorization Type Medicare Advantage    Progress Note Due on Visit 10    PT Start Time 0854    PT Stop Time 0932    PT Time Calculation (min) 38 min    Equipment Utilized During Treatment Gait belt    Activity Tolerance Patient tolerated treatment well;Patient limited by pain    Behavior During Therapy WFL for tasks assessed/performed            Past Medical History:  Diagnosis Date   Arthritis    Club foot of both lower extremities 1958   Coronary artery disease    Holter monitor 02/2012 - sinus with rare PVC   CTEV (congenital talipes equinovarus)    Gout    Hyperlipidemia    Hypertension    Myocardial infarction (HCC) ~ 2007   mild   Osteomyelitis (HCC)    Pneumonia 2000's   couple times   Pneumonia due to COVID-19 virus 04/28/2019   Type II diabetes mellitus (HCC) 2011   Past Surgical History:  Procedure Laterality Date   ANTERIOR CERVICAL DECOMP/DISCECTOMY FUSION  X 2   CARDIAC CATHETERIZATION  07/2009   CLOSED REDUCTION NASAL FRACTURE     when 68 years old   CLOSED REDUCTION NASAL FRACTURE Bilateral 09/21/2021   Procedure: CLOSED REDUCTION NASAL FRACTURE;  Surgeon: Carlie Clark, MD;  Location: [facility];  Service: ENT;  Laterality: Bilateral;   FOOT FRACTURE SURGERY Right 01/07/1989   pt a steel plate in   FOOT SURGERY Left x16   joints collapsed; keep getting infected   LAPAROSCOPY N/A 03/05/2021   Procedure: LAPAROSCOPY DIAGNOSTIC;  Surgeon: Signe Mitzie LABOR, MD;  Location: [facility] OR;  Service: General;  Laterality: N/A;   LAPAROTOMY Right 03/05/2021   Procedure: EXPLORATORY LAPAROTOMY, REPAIR SMALL BOWEL PERFORATION;  Surgeon: Signe Mitzie LABOR, MD;  Location: [facility] OR;  Service: General;  Laterality: Right;   LYSIS OF ADHESION  03/05/2021   Procedure: LYSIS OF ADHESION;  Surgeon: Signe Mitzie LABOR, MD;  Location: [facility] OR;  Service: General;;   POSTERIOR CERVICAL FUSION/FORAMINOTOMY  X 2   TOTAL HIP ARTHROPLASTY Right 01/01/2024   Procedure: ARTHROPLASTY, HIP, TOTAL, ANTERIOR APPROACH;  Surgeon: Jerri Kay HERO, MD;  Location: [facility] OR;  Service: Orthopedics;  Laterality: Right;  3-C   Patient Active Problem List   Diagnosis Date Noted   Status post total replacement of right hip 01/01/2024   Primary osteoarthritis of right hip 12/31/2023   Hx SBO 04/12/2021   Wound dehiscence, surgical 04/12/2021   Essential hypertension    Diabetes mellitus type 2 in nonobese (HCC)    Debility 03/25/2021   Malnutrition of moderate degree 03/11/2021   Left hand pain 06/22/2020   Back pain 05/20/2020   Neck pain 08/27/2019   CKD (chronic kidney disease), 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   Hepatic steatosis 05/01/2019   Prostate hypertrophy 12/06/2018   Sleep apnea-like behavior 10/01/2014   Peripheral vascular disease (HCC) 02/10/2014   Microcytic anemia 02/06/2013   CTEV (congenital talipes equinovarus)    Statin intolerance 06/29/2012   Esophageal reflux 06/29/2012   Health care maintenance 06/28/2012   Gout 06/12/2012   Obesity, Class  II, BMI 35-39.9 02/20/2012   DOE (dyspnea on exertion) 02/20/2012   Diabetes mellitus due to underlying condition, uncontrolled, with hyperglycemia (HCC) 09/06/2009   ERECTILE DYSFUNCTION, NON-ORGANIC 02/16/2009   Hyperlipemia 01/14/2009   HYPERTENSION 01/14/2009    PCP: Maree Leni Edyth DELENA, MD  REFERRING PROVIDER: Jerri Kay HERO, MD  REFERRING DIAG: [phone] (ICD-10-CM) - Status post total replacement of right hip   THERAPY DIAG:  Stiffness of right hip, not elsewhere classified  Muscle weakness (generalized)  Pain in right hip  Other abnormalities of gait and mobility  Unsteadiness on feet  Rationale  for Evaluation and Treatment: Rehabilitation  ONSET DATE: 01/01/2024  right THA sg  SUBJECTIVE:   SUBJECTIVE STATEMENT: Patient reporting increase in pain this date secondary to potentially having wallet in pocket while driving.  PERTINENT HISTORY: right THA 01/01/24, OA, club foot deformities BLEs, CAD, Congenital Talipes Equinovarus, gout, HLD, HTN, MI, DM2  Patient underwent a right Total Hip Arthroplasty anterior approach on 01/01/2024 due to OA. HHPT until 02/26/2024. At office visit on 02/20/2024, he was still having posterior lateral hip pain.   DIAGNOSTIC FINDINGS: post-op X-rays show no abnormalities s/p THA  PAIN:  NPRS scale: 7/10 Pain location: right hip posterior lateral Pain description: achy Aggravating factors: sitting or laying on left side,  Relieving factors: muscle relaxer, heating pad, pain meds when really needs it  PRECAUTIONS: Anterior hip  WEIGHT BEARING RESTRICTIONS: No  FALLS:  Has patient fallen in last 6 months? No  LIVING ENVIRONMENT: Lives with: lives alone Lives in: [place] one story Stairs: Yes: External: 4 steps; can reach both Has following equipment at home: Single point cane, Environmental Consultant - 2 wheeled, Wheelchair (manual), Gray[employer] electric, Grab bars, and Ramped entry  OCCUPATION:  retired on disability  PLOF: Independent  PATIENT GOALS:   to function with hip pain, walk well in community, clean house  Next MD visit:  03/26/2024  OBJECTIVE:   PATIENT SURVEYS:  Patient-Specific Activity Scoring Scheme  0 represents "unable to perform." 10 represents "able to perform at prior level. 0 1 2 3 4 5 6 7 8 9  10 (Date and Score)  Activity Eval     1. Walking  4     2. Sleeping  2     3.  Cleaning the house 2   4.    5.    Score 2.67    Total score = sum of the activity scores/number of activities Minimum detectable change (90%CI) for average score = 2 points Minimum detectable change (90%CI) for single activity score = 3  points  COGNITION: Overall cognitive status: WFL    SENSATION: WFL but has numbness in thigh ant-lat (feels touch)   EDEMA:  Patient appears to have localized edema present around the right hip especially laterally.  MUSCLE LENGTH: Hamstrings: Not formally tested but appears to be tight bilaterally Debby test: Not formally tested but appears to be tight bilaterally  POSTURE:  rounded shoulders, forward head, decreased lumbar lordosis, flexed trunk , and weight shift left  PALPATION: Scar has slight adhesion with rope feeling;  tenderness to posterior lateral hip esp over Piriformis & Glut Med  LOWER EXTREMITY ROM:   ROM Right eval Left eval  Hip flexion Standing  A: 61*   Hip extension Standing  A: 1*   Hip abduction Standing  A: 2* Prior to leaning   Hip adduction    Hip internal rotation    Hip external rotation    Knee flexion  Knee extension    Ankle dorsiflexion    Ankle plantarflexion    Ankle inversion    Ankle eversion     (Blank rows = not tested)  LOWER EXTREMITY MMT:  MMT Right eval Left eval  Hip flexion 3-/5 5/5  Hip extension 3-/5 5/5  Hip abduction 3-/5 5/5  Hip adduction    Hip internal rotation    Hip external rotation    Knee flexion 4/5 5/5  Knee extension 4/5 5/5  Ankle dorsiflexion    Ankle plantarflexion    Ankle inversion    Ankle eversion     (Blank rows = not tested)   FUNCTIONAL TESTS:  18 inch chair transfer: able to arise without UE assist but uses back of legs against chair to stabilize 5 times Sit to/from stand:  22.19 sec  Timed up and go with cane 12.69 seconds and without an assistive device 11.97 seconds with supervision.  Gait without a device patient had significantly increased gait dynamics indicating high fall risk.  GAIT: Distance walked: > 100' with cane and 20' without an assistive device Assistive device utilized: Single point cane and None Level of assistance: SBA Comments: Patient has significant  antalgic gait with decreased stance duration RLE, abducted gait, step to gait pattern, early lacking trailing limb hip extension.                                                                                                                                                                       TODAY'S TREATMENT                                                                          DATE: 03/05/2024 TherEx:  Recumbent bike level 4 for 8 minutes  Lateral step ups with 4 step 1x15 each side ; heavy use of bilat UEs  TherAct:  Bilat leg press 1x12, 1x24 with 75#  Unilat leg press 2x12 with 50# each side   Manual:  IASTM using cup for scar massage with PT discussing scar massage to perform at home  PT performed hand over hand scar massage for carryover   TODAY'S TREATMENT  DATE: 02/29/2024 TherEx:  UBE with bilat UE and LE level 2 for 8 minutes  Sit<>stands with bilat UE use 1x10  Added to HEP  PT discussed strengthening, flexibility, and pain management with patient   Manual:  STM to anterior hip for scar mobilization; switched to IASTM using cup on scar for scar mobilization  STM to posterolateral hip; educated patient on performing STM while standing with tennis ball which patient then performed   TODAY'S TREATMENT                                                                          DATE: 02/27/2024 Therapeutic Exercise: HEP instruction/performance c cues for techniques, handout provided.  Trial set performed of each for comprehension and symptom assessment.  See below for exercise list  PATIENT EDUCATION:  Education details: HEP, POC Person educated: Patient Education method: Explanation, Demonstration, Verbal cues, and Handouts Education comprehension: verbalized understanding, returned demonstration, and verbal cues required  HOME EXERCISE PROGRAM: Access Code: [email or web address] URL:  [email or web address] Date: 02/29/2024 Prepared by: Susannah Daring  Exercises - Standing Hip Abduction: Alternating With Resistance Loop  - 1 x daily - 7 x weekly - 2 sets - 10 reps - 5 seconds hold - Hip Extension with Resistance Loop  - 1 x daily - 7 x weekly - 2 sets - 10 reps - 5 seconds hold - Standing Hip Flexion with Resistance Loop  - 1 x daily - 7 x weekly - 2 sets - 10 reps - 5 seconds hold - Standing Marching  - 1 x daily - 7 x weekly - 2 sets - 10 reps - 5 seconds hold - Sit to Stand with Armchair  - 1 x daily - 7 x weekly - 3 sets - 10 reps  ASSESSMENT:  CLINICAL IMPRESSION: Patient arrived late to session noting car difficulty and increased soreness especially following last session. Patient tolerated all activities this date including increase in intensity. Patient will continue to benefit from skilled PT.   OBJECTIVE IMPAIRMENTS: Abnormal gait, decreased activity tolerance, decreased balance, decreased endurance, decreased knowledge of condition, decreased knowledge of use of DME, decreased mobility, difficulty walking, decreased ROM, decreased strength, increased edema, increased muscle spasms, impaired flexibility, postural dysfunction, obesity, and pain.   ACTIVITY LIMITATIONS: carrying, lifting, bending, sitting, standing, squatting, sleeping, stairs, transfers, bed mobility, and locomotion level  PARTICIPATION LIMITATIONS: meal prep, cleaning, laundry, shopping, and community activity  PERSONAL FACTORS: Age, Fitness, Past/current experiences, Time since onset of injury/illness/exacerbation, and 3+ comorbidities: see PMH are also affecting patient's functional outcome.   REHAB POTENTIAL: Good  CLINICAL DECISION MAKING: Stable/uncomplicated  EVALUATION COMPLEXITY: Low   GOALS: Goals reviewed with patient? Yes  SHORT TERM GOALS: (target date for Short term goals 03/28/2024)   1.  Patient will demonstrate independent use of home exercise program to  maintain progress from in clinic treatments.  Goal status: New  2.  Patient reports right hip pain <5/10 with activities including sleeping Goal status: New  LONG TERM GOALS: (target dates for all long term goals  05/01/2024 )   1. Patient will demonstrate/report pain at worst less than or equal to 2/10 to facilitate minimal limitation in daily activity secondary to pain symptoms. Goal status: New  2. Patient will demonstrate independent use of home exercise program to facilitate ability to maintain/progress functional gains from skilled physical therapy services. Goal status: New   3. Patient will demonstrate Patient specific functional scale avg > or = 6 to indicate reduced disability due to condition.  Goal status: New   4.  Patient will demonstrate right hip LE MMT 5/5 throughout to faciltiate usual transfers, stairs, squatting at [facility] for daily life.  Goal status: New   5.  Patient ambulates >500' and negotiate ramps and curbs without an assistive device independently. Goal status: New   6.  Patient negotiates stairs with single rail alternating pattern modified independent. Goal status: New    PLAN:  PT FREQUENCY: 2x/week  PT DURATION: 10 weeks  PLANNED INTERVENTIONS: [id number]- PT Re-evaluation, 97750- Physical Performance Testing, 97110-Therapeutic exercises, 97530- Therapeutic activity, [id number]- Neuromuscular re-education, 97535- Self Care, [id number]- Manual therapy, [id number]- Gait training, [phone]- Electrical stimulation (manual), [id number]- Ultrasound, [id number]- Ionotophoresis 4mg /ml Dexamethasone , 79439 (1-2 muscles), [id number] (3+ muscles)- Dry Needling, Patient/Family education, Balance training, Stair training, Taping, Scar mobilization, DME instructions, Cryotherapy, and Moist heat  PLAN FOR NEXT SESSION:  LE strengthening, review STM with tennis ball, scar mobs with cup, LE flexibility, balance    Susannah Daring, PT, DPT 03/05/24 9:41 AM

## 2024-03-05 ENCOUNTER — Ambulatory Visit (INDEPENDENT_AMBULATORY_CARE_PROVIDER_SITE_OTHER): Payer: Medicare (Managed Care)

## 2024-03-05 DIAGNOSIS — R2689 Other abnormalities of gait and mobility: Secondary | ICD-10-CM | POA: Diagnosis not present

## 2024-03-05 DIAGNOSIS — M25551 Pain in right hip: Secondary | ICD-10-CM | POA: Diagnosis not present

## 2024-03-05 DIAGNOSIS — R2681 Unsteadiness on feet: Secondary | ICD-10-CM

## 2024-03-05 DIAGNOSIS — M25651 Stiffness of right hip, not elsewhere classified: Secondary | ICD-10-CM | POA: Diagnosis not present

## 2024-03-05 DIAGNOSIS — M6281 Muscle weakness (generalized): Secondary | ICD-10-CM | POA: Diagnosis not present

## 2024-03-06 NOTE — Therapy (Signed)
 OUTPATIENT PHYSICAL THERAPY LOWER EXTREMITY TREATMENT   Patient Name: Daniel Perkins MRN: [id number] DOB:[birth year], 68 y.o., male Today's Date: 03/07/2024  END OF SESSION:  PT End of Session - 03/07/24 0845     Visit Number 4    Number of Visits 20    Date for Recertification  05/01/24    Authorization Type Medicare Advantage    Progress Note Due on Visit 10    PT Start Time 0845    PT Stop Time 0926    PT Time Calculation (min) 41 min    Equipment Utilized During Treatment Gait belt    Activity Tolerance Patient tolerated treatment well;Patient limited by pain    Behavior During Therapy WFL for tasks assessed/performed             Past Medical History:  Diagnosis Date   Arthritis    Club foot of both lower extremities 1958   Coronary artery disease    Holter monitor 02/2012 - sinus with rare PVC   CTEV (congenital talipes equinovarus)    Gout    Hyperlipidemia    Hypertension    Myocardial infarction (HCC) ~ 2007   mild   Osteomyelitis (HCC)    Pneumonia 2000's   couple times   Pneumonia due to COVID-19 virus 04/28/2019   Type II diabetes mellitus (HCC) 2011   Past Surgical History:  Procedure Laterality Date   ANTERIOR CERVICAL DECOMP/DISCECTOMY FUSION  X 2   CARDIAC CATHETERIZATION  07/2009   CLOSED REDUCTION NASAL FRACTURE     when 68 years old   CLOSED REDUCTION NASAL FRACTURE Bilateral 09/21/2021   Procedure: CLOSED REDUCTION NASAL FRACTURE;  Surgeon: Carlie Clark, MD;  Location: [facility];  Service: ENT;  Laterality: Bilateral;   FOOT FRACTURE SURGERY Right 01/07/1989   pt a steel plate in   FOOT SURGERY Left x16   joints collapsed; keep getting infected   LAPAROSCOPY N/A 03/05/2021   Procedure: LAPAROSCOPY DIAGNOSTIC;  Surgeon: Signe Mitzie LABOR, MD;  Location: [facility] OR;  Service: General;  Laterality: N/A;   LAPAROTOMY Right 03/05/2021   Procedure: EXPLORATORY LAPAROTOMY, REPAIR SMALL BOWEL PERFORATION;  Surgeon:  Signe Mitzie LABOR, MD;  Location: [facility] OR;  Service: General;  Laterality: Right;   LYSIS OF ADHESION  03/05/2021   Procedure: LYSIS OF ADHESION;  Surgeon: Signe Mitzie LABOR, MD;  Location: [facility] OR;  Service: General;;   POSTERIOR CERVICAL FUSION/FORAMINOTOMY  X 2   TOTAL HIP ARTHROPLASTY Right 01/01/2024   Procedure: ARTHROPLASTY, HIP, TOTAL, ANTERIOR APPROACH;  Surgeon: Jerri Kay HERO, MD;  Location: [facility] OR;  Service: Orthopedics;  Laterality: Right;  3-C   Patient Active Problem List   Diagnosis Date Noted   Status post total replacement of right hip 01/01/2024   Primary osteoarthritis of right hip 12/31/2023   Hx SBO 04/12/2021   Wound dehiscence, surgical 04/12/2021   Essential hypertension    Diabetes mellitus type 2 in nonobese (HCC)    Debility 03/25/2021   Malnutrition of moderate degree 03/11/2021   Left hand pain 06/22/2020   Back pain 05/20/2020   Neck pain 08/27/2019   CKD (chronic kidney disease), 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   Hepatic steatosis 05/01/2019   Prostate hypertrophy 12/06/2018   Sleep apnea-like behavior 10/01/2014   Peripheral vascular disease (HCC) 02/10/2014   Microcytic anemia 02/06/2013   CTEV (congenital talipes equinovarus)    Statin intolerance 06/29/2012   Esophageal reflux 06/29/2012   Health care maintenance 06/28/2012   Gout 06/12/2012   Obesity,  Class II, BMI 35-39.9 02/20/2012   DOE (dyspnea on exertion) 02/20/2012   Diabetes mellitus due to underlying condition, uncontrolled, with hyperglycemia (HCC) 09/06/2009   ERECTILE DYSFUNCTION, NON-ORGANIC 02/16/2009   Hyperlipemia 01/14/2009   HYPERTENSION 01/14/2009    PCP: Maree Leni Edyth DELENA, MD  REFERRING PROVIDER: Jerri Kay HERO, MD  REFERRING DIAG: [phone] (ICD-10-CM) - Status post total replacement of right hip   THERAPY DIAG:  Pain in right hip  Muscle weakness (generalized)  Stiffness of right hip, not elsewhere classified  Other abnormalities of gait and mobility  Unsteadiness on  feet  Rationale for Evaluation and Treatment: Rehabilitation  ONSET DATE: 01/01/2024  right THA sg  SUBJECTIVE:   SUBJECTIVE STATEMENT: Patient endorsing improvement in symptoms.   PERTINENT HISTORY: right THA 01/01/24, OA, club foot deformities BLEs, CAD, Congenital Talipes Equinovarus, gout, HLD, HTN, MI, DM2  Patient underwent a right Total Hip Arthroplasty anterior approach on 01/01/2024 due to OA. HHPT until 02/26/2024. At office visit on 02/20/2024, he was still having posterior lateral hip pain.   DIAGNOSTIC FINDINGS: post-op X-rays show no abnormalities s/p THA  PAIN:  NPRS scale: 2.5/10 Pain location: right hip posterior lateral Pain description: achy Aggravating factors: sitting or laying on left side,  Relieving factors: muscle relaxer, heating pad, pain meds when really needs it  PRECAUTIONS: Anterior hip  WEIGHT BEARING RESTRICTIONS: No  FALLS:  Has patient fallen in last 6 months? No  LIVING ENVIRONMENT: Lives with: lives alone Lives in: [place] one story Stairs: Yes: External: 4 steps; can reach both Has following equipment at home: Single point cane, Environmental Consultant - 2 wheeled, Wheelchair (manual), Graybar electric, Grab bars, and Ramped entry  OCCUPATION:  retired on disability  PLOF: Independent  PATIENT GOALS:   to function with hip pain, walk well in community, clean house  Next MD visit:  03/26/2024  OBJECTIVE:   PATIENT SURVEYS:  Patient-Specific Activity Scoring Scheme  0 represents "unable to perform." 10 represents "able to perform at prior level. 0 1 2 3 4 5 6 7 8 9  10 (Date and Score)  Activity Eval     1. Walking  4     2. Sleeping  2     3.  Cleaning the house 2   4.    5.    Score 2.67    Total score = sum of the activity scores/number of activities Minimum detectable change (90%CI) for average score = 2 points Minimum detectable change (90%CI) for single activity score = 3 points  COGNITION: Overall cognitive status:  WFL    SENSATION: WFL but has numbness in thigh ant-lat (feels touch)   EDEMA:  Patient appears to have localized edema present around the right hip especially laterally.  MUSCLE LENGTH: Hamstrings: Not formally tested but appears to be tight bilaterally Debby test: Not formally tested but appears to be tight bilaterally  POSTURE:  rounded shoulders, forward head, decreased lumbar lordosis, flexed trunk , and weight shift left  PALPATION: Scar has slight adhesion with rope feeling;  tenderness to posterior lateral hip esp over Piriformis & Glut Med  LOWER EXTREMITY ROM:   ROM Right eval Left eval  Hip flexion Standing  A: 61*   Hip extension Standing  A: 1*   Hip abduction Standing  A: 2* Prior to leaning   Hip adduction    Hip internal rotation    Hip external rotation    Knee flexion    Knee extension    Ankle dorsiflexion  Ankle plantarflexion    Ankle inversion    Ankle eversion     (Blank rows = not tested)  LOWER EXTREMITY MMT:  MMT Right eval Left eval  Hip flexion 3-/5 5/5  Hip extension 3-/5 5/5  Hip abduction 3-/5 5/5  Hip adduction    Hip internal rotation    Hip external rotation    Knee flexion 4/5 5/5  Knee extension 4/5 5/5  Ankle dorsiflexion    Ankle plantarflexion    Ankle inversion    Ankle eversion     (Blank rows = not tested)   FUNCTIONAL TESTS:  18 inch chair transfer: able to arise without UE assist but uses back of legs against chair to stabilize 5 times Sit to/from stand:  22.19 sec  Timed up and go with cane 12.69 seconds and without an assistive device 11.97 seconds with supervision.  Gait without a device patient had significantly increased gait dynamics indicating high fall risk.  GAIT: Distance walked: > 100' with cane and 20' without an assistive device Assistive device utilized: Single point cane and None Level of assistance: SBA Comments: Patient has significant antalgic gait with decreased stance duration RLE,  abducted gait, step to gait pattern, early lacking trailing limb hip extension.                                                                                                                                                                       TODAY'S TREATMENT                                                                          DATE: 03/07/2024 TherEx:  Recumbent bike level 4 for 6 minutes ; increased to level 6 at 2.5 minutes   TherAct:  Step ups 1x12 with bilat UE use Bilat leg press 2x15 with 100#  Unilat leg press 2x15 with 50# each side   Neuro Re-Ed: performed with CGA within parallel bars  Narrow stance for 30s with no increase in sway  Attempted tandem stance switching feet between, unable to perform without UE support  Semi-tandem stance 3x30s each foot fwd with increase in ankle strategy and intermittent LOB requiring UE support on parallel bars  Narrow stance with horizontal head turns x20 with no increase in sway  Narrow stance with vertical head turns x20 with no increase in sway  PT discussed 3 balance systems and how strengthening balance in a static manner before dynamic is important  TODAY'S TREATMENT                                                                          DATE: 03/05/2024 TherEx:  Recumbent bike level 4 for 8 minutes  Lateral step ups with 4 step 1x15 each side ; heavy use of bilat UEs  TherAct:  Bilat leg press 1x12, 1x24 with 75#  Unilat leg press 2x12 with 50# each side   Manual:  IASTM using cup for scar massage with PT discussing scar massage to perform at home  PT performed hand over hand scar massage for carryover   TODAY'S TREATMENT                                                                          DATE: 02/29/2024 TherEx:  UBE with bilat UE and LE level 2 for 8 minutes  Sit<>stands with bilat UE use 1x10  Added to HEP  PT discussed strengthening, flexibility, and pain management with patient   Manual:  STM to  anterior hip for scar mobilization; switched to IASTM using cup on scar for scar mobilization  STM to posterolateral hip; educated patient on performing STM while standing with tennis ball which patient then performed   TODAY'S TREATMENT                                                                          DATE: 02/27/2024 Therapeutic Exercise: HEP instruction/performance c cues for techniques, handout provided.  Trial set performed of each for comprehension and symptom assessment.  See below for exercise list  PATIENT EDUCATION:  Education details: HEP, POC Person educated: Patient Education method: Explanation, Demonstration, Verbal cues, and Handouts Education comprehension: verbalized understanding, returned demonstration, and verbal cues required  HOME EXERCISE PROGRAM: Access Code: [email or web address] URL: [email or web address] Date: 02/29/2024 Prepared by: Susannah Daring  Exercises - Standing Hip Abduction: Alternating With Resistance Loop  - 1 x daily - 7 x weekly - 2 sets - 10 reps - 5 seconds hold - Hip Extension with Resistance Loop  - 1 x daily - 7 x weekly - 2 sets - 10 reps - 5 seconds hold - Standing Hip Flexion with Resistance Loop  - 1 x daily - 7 x weekly - 2 sets - 10 reps - 5 seconds hold - Standing Marching  - 1 x daily - 7 x weekly - 2 sets - 10 reps - 5 seconds hold - Sit to Stand with Armchair  - 1 x daily - 7 x weekly - 3 sets - 10 reps  ASSESSMENT:  CLINICAL IMPRESSION:  Patient arrived to session noting improvement in overall symptoms and compliance with HEP. Patient had one instance  of emotional lability secondary to personal life, though recovered quickly. Patient tolerated all activities this date and is making improvements daily. Patient will continue to benefit from skilled PT.   OBJECTIVE IMPAIRMENTS: Abnormal gait, decreased activity tolerance, decreased balance, decreased endurance, decreased knowledge of condition, decreased knowledge of use of  DME, decreased mobility, difficulty walking, decreased ROM, decreased strength, increased edema, increased muscle spasms, impaired flexibility, postural dysfunction, obesity, and pain.   ACTIVITY LIMITATIONS: carrying, lifting, bending, sitting, standing, squatting, sleeping, stairs, transfers, bed mobility, and locomotion level  PARTICIPATION LIMITATIONS: meal prep, cleaning, laundry, shopping, and community activity  PERSONAL FACTORS: Age, Fitness, Past/current experiences, Time since onset of injury/illness/exacerbation, and 3+ comorbidities: see PMH are also affecting patient's functional outcome.   REHAB POTENTIAL: Good  CLINICAL DECISION MAKING: Stable/uncomplicated  EVALUATION COMPLEXITY: Low   GOALS: Goals reviewed with patient? Yes  SHORT TERM GOALS: (target date for Short term goals 03/28/2024)   1.  Patient will demonstrate independent use of home exercise program to maintain progress from in clinic treatments.  Goal status: New  2.  Patient reports right hip pain <5/10 with activities including sleeping Goal status: New  LONG TERM GOALS: (target dates for all long term goals  05/01/2024 )   1. Patient will demonstrate/report pain at worst less than or equal to 2/10 to facilitate minimal limitation in daily activity secondary to pain symptoms. Goal status: New   2. Patient will demonstrate independent use of home exercise program to facilitate ability to maintain/progress functional gains from skilled physical therapy services. Goal status: New   3. Patient will demonstrate Patient specific functional scale avg > or = 6 to indicate reduced disability due to condition.  Goal status: New   4.  Patient will demonstrate right hip LE MMT 5/5 throughout to faciltiate usual transfers, stairs, squatting at [facility] for daily life.  Goal status: New   5.  Patient ambulates >500' and negotiate ramps and curbs without an assistive device independently. Goal status: New   6.   Patient negotiates stairs with single rail alternating pattern modified independent. Goal status: New    PLAN:  PT FREQUENCY: 2x/week  PT DURATION: 10 weeks  PLANNED INTERVENTIONS: [id number]- PT Re-evaluation, 97750- Physical Performance Testing, 97110-Therapeutic exercises, 97530- Therapeutic activity, [id number]- Neuromuscular re-education, 97535- Self Care, [id number]- Manual therapy, [id number]- Gait training, [phone]- Electrical stimulation (manual), [id number]- Ultrasound, [id number]- Ionotophoresis 4mg /ml Dexamethasone , 79439 (1-2 muscles), [id number] (3+ muscles)- Dry Needling, Patient/Family education, Balance training, Stair training, Taping, Scar mobilization, DME instructions, Cryotherapy, and Moist heat  PLAN FOR NEXT SESSION:   LE strengthening, scar mobs with cup, LE flexibility, balance    Susannah Daring, PT, DPT 03/07/24 9:32 AM

## 2024-03-07 ENCOUNTER — Ambulatory Visit (INDEPENDENT_AMBULATORY_CARE_PROVIDER_SITE_OTHER): Payer: Medicare (Managed Care)

## 2024-03-07 DIAGNOSIS — M6281 Muscle weakness (generalized): Secondary | ICD-10-CM | POA: Diagnosis not present

## 2024-03-07 DIAGNOSIS — M25551 Pain in right hip: Secondary | ICD-10-CM

## 2024-03-07 DIAGNOSIS — R2681 Unsteadiness on feet: Secondary | ICD-10-CM

## 2024-03-07 DIAGNOSIS — M25651 Stiffness of right hip, not elsewhere classified: Secondary | ICD-10-CM | POA: Diagnosis not present

## 2024-03-07 DIAGNOSIS — R2689 Other abnormalities of gait and mobility: Secondary | ICD-10-CM

## 2024-03-11 ENCOUNTER — Encounter: Payer: Self-pay | Admitting: Physical Therapy

## 2024-03-11 ENCOUNTER — Encounter: Payer: Self-pay | Admitting: Radiology

## 2024-03-11 ENCOUNTER — Ambulatory Visit: Payer: Medicare (Managed Care) | Admitting: Physical Therapy

## 2024-03-11 DIAGNOSIS — M25551 Pain in right hip: Secondary | ICD-10-CM | POA: Diagnosis not present

## 2024-03-11 DIAGNOSIS — M25651 Stiffness of right hip, not elsewhere classified: Secondary | ICD-10-CM | POA: Diagnosis not present

## 2024-03-11 DIAGNOSIS — R2681 Unsteadiness on feet: Secondary | ICD-10-CM

## 2024-03-11 DIAGNOSIS — R2689 Other abnormalities of gait and mobility: Secondary | ICD-10-CM

## 2024-03-11 DIAGNOSIS — M6281 Muscle weakness (generalized): Secondary | ICD-10-CM

## 2024-03-11 NOTE — Therapy (Signed)
 OUTPATIENT PHYSICAL THERAPY LOWER EXTREMITY TREATMENT   Patient Name: Daniel Perkins MRN: [id number] DOB:17-May-1955, 68 y.o., male Today's Date: 03/11/2024  END OF SESSION:  PT End of Session - 03/11/24 0850     Visit Number 5    Number of Visits 20    Date for Recertification  05/01/24    Authorization Type Medicare Advantage    Progress Note Due on Visit 10    PT Start Time 0848    PT Stop Time 0930    PT Time Calculation (min) 42 min    Equipment Utilized During Treatment Gait belt    Activity Tolerance Patient tolerated treatment well;Patient limited by pain    Behavior During Therapy WFL for tasks assessed/performed              Past Medical History:  Diagnosis Date   Arthritis    Club foot of both lower extremities 1958   Coronary artery disease    Holter monitor 02/2012 - sinus with rare PVC   CTEV (congenital talipes equinovarus)    Gout    Hyperlipidemia    Hypertension    Myocardial infarction (HCC) ~ 2007   mild   Osteomyelitis (HCC)    Pneumonia 2000's   couple times   Pneumonia due to COVID-19 virus 04/28/2019   Type II diabetes mellitus (HCC) 2011   Past Surgical History:  Procedure Laterality Date   ANTERIOR CERVICAL DECOMP/DISCECTOMY FUSION  X 2   CARDIAC CATHETERIZATION  07/2009   CLOSED REDUCTION NASAL FRACTURE     when 68 years old   CLOSED REDUCTION NASAL FRACTURE Bilateral 09/21/2021   Procedure: CLOSED REDUCTION NASAL FRACTURE;  Surgeon: Carlie Clark, MD;  Location: [facility];  Service: ENT;  Laterality: Bilateral;   FOOT FRACTURE SURGERY Right 01/07/1989   pt a steel plate in   FOOT SURGERY Left x16   joints collapsed; keep getting infected   LAPAROSCOPY N/A 03/05/2021   Procedure: LAPAROSCOPY DIAGNOSTIC;  Surgeon: Signe Mitzie LABOR, MD;  Location: [facility] OR;  Service: General;  Laterality: N/A;   LAPAROTOMY Right 03/05/2021   Procedure: EXPLORATORY LAPAROTOMY, REPAIR SMALL BOWEL PERFORATION;  Surgeon:  Signe Mitzie LABOR, MD;  Location: [facility] OR;  Service: General;  Laterality: Right;   LYSIS OF ADHESION  03/05/2021   Procedure: LYSIS OF ADHESION;  Surgeon: Signe Mitzie LABOR, MD;  Location: [facility] OR;  Service: General;;   POSTERIOR CERVICAL FUSION/FORAMINOTOMY  X 2   TOTAL HIP ARTHROPLASTY Right 01/01/2024   Procedure: ARTHROPLASTY, HIP, TOTAL, ANTERIOR APPROACH;  Surgeon: Jerri Kay HERO, MD;  Location: [facility] OR;  Service: Orthopedics;  Laterality: Right;  3-C   Patient Active Problem List   Diagnosis Date Noted   Status post total replacement of right hip 01/01/2024   Primary osteoarthritis of right hip 12/31/2023   Hx SBO 04/12/2021   Wound dehiscence, surgical 04/12/2021   Essential hypertension    Diabetes mellitus type 2 in nonobese (HCC)    Debility 03/25/2021   Malnutrition of moderate degree 03/11/2021   Left hand pain 06/22/2020   Back pain 05/20/2020   Neck pain 08/27/2019   CKD (chronic kidney disease), 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   Hepatic steatosis 05/01/2019   Prostate hypertrophy 12/06/2018   Sleep apnea-like behavior 10/01/2014   Peripheral vascular disease (HCC) 02/10/2014   Microcytic anemia 02/06/2013   CTEV (congenital talipes equinovarus)    Statin intolerance 06/29/2012   Esophageal reflux 06/29/2012   Health care maintenance 06/28/2012   Gout 06/12/2012  Obesity, Class II, BMI 35-39.9 02/20/2012   DOE (dyspnea on exertion) 02/20/2012   Diabetes mellitus due to underlying condition, uncontrolled, with hyperglycemia (HCC) 09/06/2009   ERECTILE DYSFUNCTION, NON-ORGANIC 02/16/2009   Hyperlipemia 01/14/2009   HYPERTENSION 01/14/2009    PCP: Maree Leni Edyth DELENA, MD  REFERRING PROVIDER: Jule Ronal CROME, PA-C  REFERRING DIAG: [phone] (ICD-10-CM) - Status post total replacement of right hip   THERAPY DIAG:  Muscle weakness (generalized)  Stiffness of right hip, not elsewhere classified  Pain in right hip  Other abnormalities of gait and mobility  Unsteadiness on  feet  Rationale for Evaluation and Treatment: Rehabilitation  ONSET DATE: 01/01/2024  right THA sg  SUBJECTIVE:   SUBJECTIVE STATEMENT: He reports right buttocks still bothers him.  He reports that activities don't flare it up.    PERTINENT HISTORY: right THA 01/01/24, OA, club foot deformities BLEs, CAD, Congenital Talipes Equinovarus, gout, HLD, HTN, MI, DM2  Patient underwent a right Total Hip Arthroplasty anterior approach on 01/01/2024 due to OA. HHPT until 02/26/2024. At office visit on 02/20/2024, he was still having posterior lateral hip pain.   DIAGNOSTIC FINDINGS: post-op X-rays show no abnormalities s/p THA  PAIN:  NPRS scale:  3/10 Pain location: right hip posterior lateral Pain description: achy Aggravating factors: sitting or laying on left side,  Relieving factors: muscle relaxer, heating pad, pain meds when really needs it  PRECAUTIONS: Anterior hip  WEIGHT BEARING RESTRICTIONS: No  FALLS:  Has patient fallen in last 6 months? No  LIVING ENVIRONMENT: Lives with: lives alone Lives in: [place] one story Stairs: Yes: External: 4 steps; can reach both Has following equipment at home: Single point cane, Environmental Consultant - 2 wheeled, Wheelchair (manual), Graybar electric, Grab bars, and Ramped entry  OCCUPATION:  retired on disability  PLOF: Independent  PATIENT GOALS:   to function with hip pain, walk well in community, clean house  Next MD visit:  03/26/2024  OBJECTIVE:   PATIENT SURVEYS:  Patient-Specific Activity Scoring Scheme  0 represents "unable to perform." 10 represents "able to perform at prior level. 0 1 2 3 4 5 6 7 8 9  10 (Date and Score)  Activity Eval     1. Walking  4     2. Sleeping  2     3.  Cleaning the house 2   4.    5.    Score 2.67    Total score = sum of the activity scores/number of activities Minimum detectable change (90%CI) for average score = 2 points Minimum detectable change (90%CI) for single activity score = 3  points  COGNITION: Overall cognitive status: WFL    SENSATION: WFL but has numbness in thigh ant-lat (feels touch)   EDEMA:  Patient appears to have localized edema present around the right hip especially laterally.  MUSCLE LENGTH: Hamstrings: Not formally tested but appears to be tight bilaterally Debby test: Not formally tested but appears to be tight bilaterally  POSTURE:  rounded shoulders, forward head, decreased lumbar lordosis, flexed trunk , and weight shift left  PALPATION: Scar has slight adhesion with rope feeling;  tenderness to posterior lateral hip esp over Piriformis & Glut Med  LOWER EXTREMITY ROM:   ROM Right eval Left eval  Hip flexion Standing  A: 61*   Hip extension Standing  A: 1*   Hip abduction Standing  A: 2* Prior to leaning   Hip adduction    Hip internal rotation    Hip external rotation  Knee flexion    Knee extension    Ankle dorsiflexion    Ankle plantarflexion    Ankle inversion    Ankle eversion     (Blank rows = not tested)  LOWER EXTREMITY MMT:  MMT Right eval Left eval  Hip flexion 3-/5 5/5  Hip extension 3-/5 5/5  Hip abduction 3-/5 5/5  Hip adduction    Hip internal rotation    Hip external rotation    Knee flexion 4/5 5/5  Knee extension 4/5 5/5  Ankle dorsiflexion    Ankle plantarflexion    Ankle inversion    Ankle eversion     (Blank rows = not tested)   FUNCTIONAL TESTS:  18 inch chair transfer: able to arise without UE assist but uses back of legs against chair to stabilize 5 times Sit to/from stand:  22.19 sec  Timed up and go with cane 12.69 seconds and without an assistive device 11.97 seconds with supervision.  Gait without a device patient had significantly increased gait dynamics indicating high fall risk.  GAIT: Distance walked: > 100' with cane and 20' without an assistive device Assistive device utilized: Single point cane and None Level of assistance: SBA Comments: Patient has significant  antalgic gait with decreased stance duration RLE, abducted gait, step to gait pattern, early lacking trailing limb hip extension.                                                                                                                                                                       TODAY'S TREATMENT                                                                          DATE: 03/07/2024 Therapeutic Exercise:  Gastroc stretch step heel depression with ea LE 30 sec hold 3 reps Hamstring stretch foot on step with ea LE 30 sec hold 3 reps Seated piriformis stretch knee to opposite shoulder with ea LE 30 sec hold 3 reps PT added above stretches to HEP including HO. Pt verbalized understanding after performing & review of HO.  Therapeutic Activities:  Bilat leg press 2x15 with 100#  Unilat leg press 2x15 with 50# each side  RLE Forward / backward Step ups 6 step 1x15 with bilat UE use. PT tactile & verbal cues to keep weight over rt foot to facilitate engaging LE muscles > UE use. mountain climber at counter plank position 5 reps 2 sets.   Neuromuscular Re-Education:  performed with CGA within parallel bars  Rocker board (square with double pivot) PT demo & verbal cues on technique and mirror to facilitate upright posture.  1 min ea: ant/midline/post and right/midline/left 1 rep BUE support //bars and 2nd rep with mid-mod A without UE support. Narrow stance for 30s with no increase in sway  Standing crossways on foam beam head turn right/left and up/down with PT minA / tactile cues and mirror for visual feedback.  PT working on hip strategy for balance reactions. Tandem gait with counter and cane support 3 laps Braiding with counter support 3 laps   TREATMENT                                                                          DATE: 03/07/2024 TherEx:  Recumbent bike level 4 for 6 minutes ; increased to level 6 at 2.5 minutes   TherAct:  Step ups 1x12 with bilat UE use Bilat  leg press 2x15 with 100#  Unilat leg press 2x15 with 50# each side   Neuro Re-Ed: performed with CGA within parallel bars  Narrow stance for 30s with no increase in sway  Attempted tandem stance switching feet between, unable to perform without UE support  Semi-tandem stance 3x30s each foot fwd with increase in ankle strategy and intermittent LOB requiring UE support on parallel bars  Narrow stance with horizontal head turns x20 with no increase in sway  Narrow stance with vertical head turns x20 with no increase in sway  PT discussed 3 balance systems and how strengthening balance in a static manner before dynamic is important   TREATMENT                                                                          DATE: 03/05/2024 TherEx:  Recumbent bike level 4 for 8 minutes  Lateral step ups with 4 step 1x15 each side ; heavy use of bilat UEs  TherAct:  Bilat leg press 1x12, 1x24 with 75#  Unilat leg press 2x12 with 50# each side   Manual:  IASTM using cup for scar massage with PT discussing scar massage to perform at home  PT performed hand over hand scar massage for carryover    TREATMENT                                                                          DATE: 02/29/2024 TherEx:  UBE with bilat UE and LE level 2 for 8 minutes  Sit<>stands with bilat UE use 1x10  Added to HEP  PT discussed strengthening, flexibility, and pain management with patient   Manual:  STM to anterior hip for scar mobilization; switched to IASTM  using cup on scar for scar mobilization  STM to posterolateral hip; educated patient on performing STM while standing with tennis ball which patient then performed     HOME EXERCISE PROGRAM: Access Code: [email or web address] URL: [email or web address] Date: 02/29/2024 Prepared by: Susannah Daring  Exercises - Standing Hip Abduction: Alternating With Resistance Loop  - 1 x daily - 7 x weekly - 2 sets - 10 reps - 5 seconds hold - Hip Extension with  Resistance Loop  - 1 x daily - 7 x weekly - 2 sets - 10 reps - 5 seconds hold - Standing Hip Flexion with Resistance Loop  - 1 x daily - 7 x weekly - 2 sets - 10 reps - 5 seconds hold - Standing Marching  - 1 x daily - 7 x weekly - 2 sets - 10 reps - 5 seconds hold - Sit to Stand with Armchair  - 1 x daily - 7 x weekly - 3 sets - 10 reps  ASSESSMENT:  CLINICAL IMPRESSION: PT updated HEP to include stretches and balance activities near counter.  He appears to understand these exercises.  Patient will continue to benefit from skilled PT.   OBJECTIVE IMPAIRMENTS: Abnormal gait, decreased activity tolerance, decreased balance, decreased endurance, decreased knowledge of condition, decreased knowledge of use of DME, decreased mobility, difficulty walking, decreased ROM, decreased strength, increased edema, increased muscle spasms, impaired flexibility, postural dysfunction, obesity, and pain.   ACTIVITY LIMITATIONS: carrying, lifting, bending, sitting, standing, squatting, sleeping, stairs, transfers, bed mobility, and locomotion level  PARTICIPATION LIMITATIONS: meal prep, cleaning, laundry, shopping, and community activity  PERSONAL FACTORS: Age, Fitness, Past/current experiences, Time since onset of injury/illness/exacerbation, and 3+ comorbidities: see PMH are also affecting patient's functional outcome.   REHAB POTENTIAL: Good  CLINICAL DECISION MAKING: Stable/uncomplicated  EVALUATION COMPLEXITY: Low   GOALS: Goals reviewed with patient? Yes  SHORT TERM GOALS: (target date for Short term goals 03/28/2024)   1.  Patient will demonstrate independent use of home exercise program to maintain progress from in clinic treatments. Goal status: Ongoing   03/11/2024  2.  Patient reports right hip pain <5/10 with activities including sleeping Goal status: Ongoing   03/11/2024  LONG TERM GOALS: (target dates for all long term goals  05/01/2024 )   1. Patient will demonstrate/report pain at  worst less than or equal to 2/10 to facilitate minimal limitation in daily activity secondary to pain symptoms. Goal status: Ongoing   03/11/2024   2. Patient will demonstrate independent use of home exercise program to facilitate ability to maintain/progress functional gains from skilled physical therapy services. Goal status: Ongoing   03/11/2024   3. Patient will demonstrate Patient specific functional scale avg > or = 6 to indicate reduced disability due to condition.  Goal status: Ongoing   03/11/2024   4.  Patient will demonstrate right hip LE MMT 5/5 throughout to faciltiate usual transfers, stairs, squatting at [facility] for daily life.  Goal status: Ongoing   03/11/2024   5.  Patient ambulates >500' and negotiate ramps and curbs without an assistive device independently. Goal status: Ongoing   03/11/2024   6.  Patient negotiates stairs with single rail alternating pattern modified independent. Goal status:   Ongoing   03/11/2024    PLAN:  PT FREQUENCY: 2x/week  PT DURATION: 10 weeks  PLANNED INTERVENTIONS: [id number]- PT Re-evaluation, 97750- Physical Performance Testing, 97110-Therapeutic exercises, 97530- Therapeutic activity, [id number]- Neuromuscular re-education, 97535- Self Care, [id number]- Manual therapy, [id number]- Gait training, [id number]- Electrical stimulation (manual), [id number]-  Ultrasound, 02966- Ionotophoresis 4mg /ml Dexamethasone , 79439 (1-2 muscles), 20561 (3+ muscles)- Dry Needling, Patient/Family education, Balance training, Stair training, Taping, Scar mobilization, DME instructions, Cryotherapy, and Moist heat  PLAN FOR NEXT SESSION:   Check on updated HEP.    LE strengthening, scar mobs with cup, LE flexibility, balance    Grayce Spatz, PT, DPT 03/11/2024, 4:24 PM

## 2024-03-13 ENCOUNTER — Ambulatory Visit: Payer: Medicare (Managed Care) | Admitting: Physical Therapy

## 2024-03-13 ENCOUNTER — Encounter: Payer: Self-pay | Admitting: Physical Therapy

## 2024-03-13 DIAGNOSIS — M25551 Pain in right hip: Secondary | ICD-10-CM | POA: Diagnosis not present

## 2024-03-13 DIAGNOSIS — M25651 Stiffness of right hip, not elsewhere classified: Secondary | ICD-10-CM | POA: Diagnosis not present

## 2024-03-13 DIAGNOSIS — R2681 Unsteadiness on feet: Secondary | ICD-10-CM

## 2024-03-13 DIAGNOSIS — M6281 Muscle weakness (generalized): Secondary | ICD-10-CM | POA: Diagnosis not present

## 2024-03-13 DIAGNOSIS — R2689 Other abnormalities of gait and mobility: Secondary | ICD-10-CM

## 2024-03-13 NOTE — Therapy (Signed)
 OUTPATIENT PHYSICAL THERAPY LOWER EXTREMITY TREATMENT   Patient Name: Daniel Perkins MRN: [id number] DOB:[birth year], 68 y.o., male Today's Date: 03/13/2024  END OF SESSION:  PT End of Session - 03/13/24 0846     Visit Number 6    Number of Visits 20    Date for Recertification  05/01/24    Authorization Type Medicare Advantage    Progress Note Due on Visit 10    PT Start Time 0845    PT Stop Time 0928    PT Time Calculation (min) 43 min    Equipment Utilized During Treatment Gait belt    Activity Tolerance Patient tolerated treatment well;Patient limited by pain    Behavior During Therapy WFL for tasks assessed/performed               Past Medical History:  Diagnosis Date   Arthritis    Club foot of both lower extremities 1958   Coronary artery disease    Holter monitor 02/2012 - sinus with rare PVC   CTEV (congenital talipes equinovarus)    Gout    Hyperlipidemia    Hypertension    Myocardial infarction (HCC) ~ 2007   mild   Osteomyelitis (HCC)    Pneumonia 2000's   couple times   Pneumonia due to COVID-19 virus 04/28/2019   Type II diabetes mellitus (HCC) 2011   Past Surgical History:  Procedure Laterality Date   ANTERIOR CERVICAL DECOMP/DISCECTOMY FUSION  X 2   CARDIAC CATHETERIZATION  07/2009   CLOSED REDUCTION NASAL FRACTURE     when 68 years old   CLOSED REDUCTION NASAL FRACTURE Bilateral 09/21/2021   Procedure: CLOSED REDUCTION NASAL FRACTURE;  Surgeon: Carlie Clark, MD;  Location:  [facility];  Service: ENT;  Laterality: Bilateral;   FOOT FRACTURE SURGERY Right 01/07/1989   pt a steel plate in   FOOT SURGERY Left x16   joints collapsed; keep getting infected   LAPAROSCOPY N/A 03/05/2021   Procedure: LAPAROSCOPY DIAGNOSTIC;  Surgeon: Signe Mitzie LABOR, MD;  [facility];  Service: General;  Laterality: N/A;   LAPAROTOMY Right 03/05/2021   Procedure: EXPLORATORY LAPAROTOMY, REPAIR SMALL BOWEL PERFORATION;  Surgeon:  Signe Mitzie LABOR, MD;  Location: [facility] OR;  Service: General;  Laterality: Right;   LYSIS OF ADHESION  03/05/2021   Procedure: LYSIS OF ADHESION;  Surgeon: Signe Mitzie LABOR, MD;  Location: [facility] OR;  Service: General;;   POSTERIOR CERVICAL FUSION/FORAMINOTOMY  X 2   TOTAL HIP ARTHROPLASTY Right 01/01/2024   Procedure: ARTHROPLASTY, HIP, TOTAL, ANTERIOR APPROACH;  Surgeon: Jerri Kay HERO, MD;  Location: [facility] OR;  Service: Orthopedics;  Laterality: Right;  3-C   Patient Active Problem List   Diagnosis Date Noted   Status post total replacement of right hip 01/01/2024   Primary osteoarthritis of right hip 12/31/2023   Hx SBO 04/12/2021   Wound dehiscence, surgical 04/12/2021   Essential hypertension    Diabetes mellitus type 2 in nonobese (HCC)    Debility 03/25/2021   Malnutrition of moderate degree 03/11/2021   Left hand pain 06/22/2020   Back pain 05/20/2020   Neck pain 08/27/2019   CKD (chronic kidney disease), 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   Hepatic steatosis 05/01/2019   Prostate hypertrophy 12/06/2018   Sleep apnea-like behavior 10/01/2014   Peripheral vascular disease (HCC) 02/10/2014   Microcytic anemia 02/06/2013   CTEV (congenital talipes equinovarus)    Statin intolerance 06/29/2012   Esophageal reflux 06/29/2012   Health care maintenance 06/28/2012   Gout 06/12/2012  Obesity, Class II, BMI 35-39.9 02/20/2012   DOE (dyspnea on exertion) 02/20/2012   Diabetes mellitus due to underlying condition, uncontrolled, with hyperglycemia (HCC) 09/06/2009   ERECTILE DYSFUNCTION, NON-ORGANIC 02/16/2009   Hyperlipemia 01/14/2009   HYPERTENSION 01/14/2009    PCP: Maree Leni Edyth DELENA, MD  REFERRING PROVIDER: Jule Ronal CROME, PA-C  REFERRING DIAG: [phone] (ICD-10-CM) - Status post total replacement of right hip   THERAPY DIAG:  Muscle weakness (generalized)  Stiffness of right hip, not elsewhere classified  Pain in right hip  Other abnormalities of gait and mobility  Unsteadiness on  feet  Rationale for Evaluation and Treatment: Rehabilitation  ONSET DATE: 01/01/2024  right THA sg  SUBJECTIVE:   SUBJECTIVE STATEMENT: He woke up with anterior groin pain this morning.  He has been doing stretches without issues.   PERTINENT HISTORY: right THA 01/01/24, OA, club foot deformities BLEs, CAD, Congenital Talipes Equinovarus, gout, HLD, HTN, MI, DM2  Patient underwent a right Total Hip Arthroplasty anterior approach on 01/01/2024 due to OA. HHPT until 02/26/2024. At office visit on 02/20/2024, he was still having posterior lateral hip pain.   DIAGNOSTIC FINDINGS: post-op X-rays show no abnormalities s/p THA  PAIN:  NPRS scale:  since last PT 1-2/10 except this morning 6.5/10 Pain location: right hip posterior lateral Pain description: achy Aggravating factors: sitting or laying on left side,  Relieving factors: muscle relaxer, heating pad, pain meds when really needs it  PRECAUTIONS: Anterior hip  WEIGHT BEARING RESTRICTIONS: No  FALLS:  Has patient fallen in last 6 months? No  LIVING ENVIRONMENT: Lives with: lives alone Lives in: [place] one story Stairs: Yes: External: 4 steps; can reach both Has following equipment at home: Single point cane, Environmental Consultant - 2 wheeled, Wheelchair (manual), Graybar electric, Grab bars, and Ramped entry  OCCUPATION:  retired on disability  PLOF: Independent  PATIENT GOALS:   to function with hip pain, walk well in community, clean house  Next MD visit:  03/26/2024  OBJECTIVE:   PATIENT SURVEYS:  Patient-Specific Activity Scoring Scheme  0 represents "unable to perform." 10 represents "able to perform at prior level. 0 1 2 3 4 5 6 7 8 9  10 (Date and Score)  Activity Eval     1. Walking  4     2. Sleeping  2     3.  Cleaning the house 2   4.    5.    Score 2.67    Total score = sum of the activity scores/number of activities Minimum detectable change (90%CI) for average score = 2 points Minimum detectable change (90%CI)  for single activity score = 3 points  COGNITION: Overall cognitive status: WFL    SENSATION: WFL but has numbness in thigh ant-lat (feels touch)   EDEMA:  Patient appears to have localized edema present around the right hip especially laterally.  MUSCLE LENGTH: Hamstrings: Not formally tested but appears to be tight bilaterally Debby test: Not formally tested but appears to be tight bilaterally  POSTURE:  rounded shoulders, forward head, decreased lumbar lordosis, flexed trunk , and weight shift left  PALPATION: Scar has slight adhesion with rope feeling;  tenderness to posterior lateral hip esp over Piriformis & Glut Med  LOWER EXTREMITY ROM:   ROM Right eval Left eval  Hip flexion Standing  A: 61*   Hip extension Standing  A: 1*   Hip abduction Standing  A: 2* Prior to leaning   Hip adduction    Hip internal rotation  Hip external rotation    Knee flexion    Knee extension    Ankle dorsiflexion    Ankle plantarflexion    Ankle inversion    Ankle eversion     (Blank rows = not tested)  LOWER EXTREMITY MMT:  MMT Right eval Left eval  Hip flexion 3-/5 5/5  Hip extension 3-/5 5/5  Hip abduction 3-/5 5/5  Hip adduction    Hip internal rotation    Hip external rotation    Knee flexion 4/5 5/5  Knee extension 4/5 5/5  Ankle dorsiflexion    Ankle plantarflexion    Ankle inversion    Ankle eversion     (Blank rows = not tested)   FUNCTIONAL TESTS:  18 inch chair transfer: able to arise without UE assist but uses back of legs against chair to stabilize 5 times Sit to/from stand:  22.19 sec  Timed up and go with cane 12.69 seconds and without an assistive device 11.97 seconds with supervision.  Gait without a device patient had significantly increased gait dynamics indicating high fall risk.  GAIT: Distance walked: > 100' with cane and 20' without an assistive device Assistive device utilized: Single point cane and None Level of assistance:  SBA Comments: Patient has significant antalgic gait with decreased stance duration RLE, abducted gait, step to gait pattern, early lacking trailing limb hip extension.                                                                                                                                                                       TODAY'S TREATMENT                                                                          DATE: 03/13/2024 Therapeutic Exercise:  recumbent bike seat 7 level 4 for 6 min working on range Standing red theraband hip strengthening with BUE support (chair back & cane) with each LE 10 reps flexion (straight leg), abduction, extension and adduction.   Therapeutic Activities:  Bilat leg press 2x15 with 112#  Unilat leg press 2x15 with 56# each side  Squat over chair with BUE support on sink 10 reps.  PT demo & verbal cues on sit to stand and stand to sit technique. Pt performed 10 reps from 18 chair without armrests using BUEs on seat.   Manual Therapy Cupping and soft tissue mobs to right anterior hip scar.     TREATMENT  DATE: 03/07/2024 Therapeutic Exercise:  Gastroc stretch step heel depression with ea LE 30 sec hold 3 reps Hamstring stretch foot on step with ea LE 30 sec hold 3 reps Seated piriformis stretch knee to opposite shoulder with ea LE 30 sec hold 3 reps PT added above stretches to HEP including HO. Pt verbalized understanding after performing & review of HO.  Therapeutic Activities:  Bilat leg press 2x15 with 100#  Unilat leg press 2x15 with 50# each side  RLE Forward / backward Step ups 6 step 1x15 with bilat UE use. PT tactile & verbal cues to keep weight over rt foot to facilitate engaging LE muscles > UE use. mountain climber at counter plank position 5 reps 2 sets.   Neuromuscular Re-Education: performed with CGA within parallel bars  Rocker board (square with double  pivot) PT demo & verbal cues on technique and mirror to facilitate upright posture.  1 min ea: ant/midline/post and right/midline/left 1 rep BUE support //bars and 2nd rep with mid-mod A without UE support. Narrow stance for 30s with no increase in sway  Standing crossways on foam beam head turn right/left and up/down with PT minA / tactile cues and mirror for visual feedback.  PT working on hip strategy for balance reactions. Tandem gait with counter and cane support 3 laps Braiding with counter support 3 laps   TREATMENT                                                                          DATE: 03/07/2024 TherEx:  Recumbent bike level 4 for 6 minutes ; increased to level 6 at 2.5 minutes   TherAct:  Step ups 1x12 with bilat UE use Bilat leg press 2x15 with 100#  Unilat leg press 2x15 with 50# each side   Neuro Re-Ed: performed with CGA within parallel bars  Narrow stance for 30s with no increase in sway  Attempted tandem stance switching feet between, unable to perform without UE support  Semi-tandem stance 3x30s each foot fwd with increase in ankle strategy and intermittent LOB requiring UE support on parallel bars  Narrow stance with horizontal head turns x20 with no increase in sway  Narrow stance with vertical head turns x20 with no increase in sway  PT discussed 3 balance systems and how strengthening balance in a static manner before dynamic is important   TREATMENT                                                                          DATE: 03/05/2024 TherEx:  Recumbent bike level 4 for 8 minutes  Lateral step ups with 4 step 1x15 each side ; heavy use of bilat UEs  TherAct:  Bilat leg press 1x12, 1x24 with 75#  Unilat leg press 2x12 with 50# each side   Manual:  IASTM using cup for scar massage with PT discussing scar massage to perform at home  PT performed hand over hand scar massage  for carryover     HOME EXERCISE PROGRAM: Access Code: [email or web address] URL:  [email or web address] Date: 03/13/2024 Prepared by: Grayce Spatz  Exercises - Standing Hip Abduction: Alternating With Resistance Loop  - 1 x daily - 7 x weekly - 2 sets - 10 reps - 5 seconds hold - Hip Extension with Resistance Loop  - 1 x daily - 7 x weekly - 2 sets - 10 reps - 5 seconds hold - Standing Hip Flexion with Resistance Loop  - 1 x daily - 7 x weekly - 2 sets - 10 reps - 5 seconds hold - Standing Marching  - 1 x daily - 7 x weekly - 2 sets - 10 reps - 5 seconds hold - Sit to Stand with Armchair  - 1 x daily - 7 x weekly - 3 sets - 10 reps - Gastroc Stretch on Step  - 1-2 x daily - 7 x weekly - 1 sets - 3 reps - 30 seconds hold - Standing Hamstring Stretch with Step  - 1-2 x daily - 7 x weekly - 1 sets - 3 reps - 30 seconds hold - Seated Piriformis Stretch  - 1-2 x daily - 7 x weekly - 1 sets - 3 reps - 30 seconds hold - Tandem Walking with Counter Support  - 1 x daily - 7 x weekly - 3 sets - 10 reps - [place] with Counter Support  - 1 x daily - 3 x weekly - 3 sets - 10 reps - [employer] on Counter  - 1 x daily - 7 x weekly - 12 sets - 10 reps  ASSESSMENT:  CLINICAL IMPRESSION: Patient reported anterior hip pain was better by end of PT session.  Pt improved sit to/from stand with chairs without armrests with PT instruction and repetition.   Patient will continue to benefit from skilled PT.   OBJECTIVE IMPAIRMENTS: Abnormal gait, decreased activity tolerance, decreased balance, decreased endurance, decreased knowledge of condition, decreased knowledge of use of DME, decreased mobility, difficulty walking, decreased ROM, decreased strength, increased edema, increased muscle spasms, impaired flexibility, postural dysfunction, obesity, and pain.   ACTIVITY LIMITATIONS: carrying, lifting, bending, sitting, standing, squatting, sleeping, stairs, transfers, bed mobility, and locomotion level  PARTICIPATION LIMITATIONS: meal prep, cleaning, laundry, shopping, and  community activity  PERSONAL FACTORS: Age, Fitness, Past/current experiences, Time since onset of injury/illness/exacerbation, and 3+ comorbidities: see PMH are also affecting patient's functional outcome.   REHAB POTENTIAL: Good  CLINICAL DECISION MAKING: Stable/uncomplicated  EVALUATION COMPLEXITY: Low   GOALS: Goals reviewed with patient? Yes  SHORT TERM GOALS: (target date for Short term goals 03/28/2024)   1.  Patient will demonstrate independent use of home exercise program to maintain progress from in clinic treatments. Goal status: Ongoing   03/11/2024  2.  Patient reports right hip pain <5/10 with activities including sleeping Goal status: Ongoing   03/11/2024  LONG TERM GOALS: (target dates for all long term goals  05/01/2024 )   1. Patient will demonstrate/report pain at worst less than or equal to 2/10 to facilitate minimal limitation in daily activity secondary to pain symptoms. Goal status: Ongoing   03/11/2024   2. Patient will demonstrate independent use of home exercise program to facilitate ability to maintain/progress functional gains from skilled physical therapy services. Goal status: Ongoing   03/11/2024   3. Patient will demonstrate Patient specific functional scale avg > or = 6 to indicate reduced disability due to condition.  Goal status: Ongoing   03/11/2024  4.  Patient will demonstrate right hip LE MMT 5/5 throughout to faciltiate usual transfers, stairs, squatting at [facility] for daily life.  Goal status: Ongoing   03/11/2024   5.  Patient ambulates >500' and negotiate ramps and curbs without an assistive device independently. Goal status: Ongoing   03/11/2024   6.  Patient negotiates stairs with single rail alternating pattern modified independent. Goal status:   Ongoing   03/11/2024    PLAN:  PT FREQUENCY: 2x/week  PT DURATION: 10 weeks  PLANNED INTERVENTIONS: [id number]- PT Re-evaluation, 97750- Physical Performance Testing, 97110-Therapeutic  exercises, 97530- Therapeutic activity, [id number]- Neuromuscular re-education, 97535- Self Care, [id number]- Manual therapy, [id number]- Gait training, [phone]- Electrical stimulation (manual), [id number]- Ultrasound, [id number]- Ionotophoresis 4mg /ml Dexamethasone , 79439 (1-2 muscles), [id number] (3+ muscles)- Dry Needling, Patient/Family education, Balance training, Stair training, Taping, Scar mobilization, DME instructions, Cryotherapy, and Moist heat  PLAN FOR NEXT SESSION: check AROM.    LE strengthening, scar mobs with cup, LE flexibility, balance    Grayce Spatz, PT, DPT 03/13/2024, 12:49 PM

## 2024-03-18 ENCOUNTER — Encounter: Payer: Self-pay | Admitting: Physical Therapy

## 2024-03-18 ENCOUNTER — Ambulatory Visit (INDEPENDENT_AMBULATORY_CARE_PROVIDER_SITE_OTHER): Payer: Medicare (Managed Care) | Admitting: Physical Therapy

## 2024-03-18 DIAGNOSIS — M25551 Pain in right hip: Secondary | ICD-10-CM

## 2024-03-18 DIAGNOSIS — M6281 Muscle weakness (generalized): Secondary | ICD-10-CM | POA: Diagnosis not present

## 2024-03-18 DIAGNOSIS — R2681 Unsteadiness on feet: Secondary | ICD-10-CM

## 2024-03-18 DIAGNOSIS — M25651 Stiffness of right hip, not elsewhere classified: Secondary | ICD-10-CM | POA: Diagnosis not present

## 2024-03-18 DIAGNOSIS — R2689 Other abnormalities of gait and mobility: Secondary | ICD-10-CM

## 2024-03-18 NOTE — Therapy (Signed)
 OUTPATIENT PHYSICAL THERAPY LOWER EXTREMITY TREATMENT   Patient Name: Daniel Perkins MRN: [id number] DOB:1955/10/17, 68 y.o., male Today's Date: 03/18/2024  END OF SESSION:  PT End of Session - 03/18/24 0855     Visit Number 7    Number of Visits 20    Date for Recertification  05/01/24    Authorization Type Medicare Advantage    Progress Note Due on Visit 10    PT Start Time [phone]    PT Stop Time 0930    PT Time Calculation (min) 39 min    Equipment Utilized During Treatment Gait belt    Activity Tolerance Patient tolerated treatment well;Patient limited by pain    Behavior During Therapy WFL for tasks assessed/performed          Past Medical History:  Diagnosis Date   Arthritis    Club foot of both lower extremities 1958   Coronary artery disease    Holter monitor 02/2012 - sinus with rare PVC   CTEV (congenital talipes equinovarus)    Gout    Hyperlipidemia    Hypertension    Myocardial infarction (HCC) ~ 2007   mild   Osteomyelitis (HCC)    Pneumonia 2000's   couple times   Pneumonia due to COVID-19 virus 04/28/2019   Type II diabetes mellitus (HCC) 2011   Past Surgical History:  Procedure Laterality Date   ANTERIOR CERVICAL DECOMP/DISCECTOMY FUSION  X 2   CARDIAC CATHETERIZATION  07/2009   CLOSED REDUCTION NASAL FRACTURE     when 68 years old   CLOSED REDUCTION NASAL FRACTURE Bilateral 09/21/2021   Procedure: CLOSED REDUCTION NASAL FRACTURE;  Surgeon: Carlie Clark, MD;  Location: [facility];  Service: ENT;  Laterality: Bilateral;   FOOT FRACTURE SURGERY Right 01/07/1989   pt a steel plate in   FOOT SURGERY Left x16   joints collapsed; keep getting infected   LAPAROSCOPY N/A 03/05/2021   Procedure: LAPAROSCOPY DIAGNOSTIC;  Surgeon: Signe Mitzie LABOR, MD;  Location: [facility] OR;  Service: General;  Laterality: N/A;   LAPAROTOMY Right 03/05/2021   Procedure: EXPLORATORY LAPAROTOMY, REPAIR SMALL BOWEL PERFORATION;  Surgeon: Signe Mitzie LABOR, MD;  Location: [facility] OR;  Service: General;  Laterality: Right;   LYSIS OF ADHESION  03/05/2021   Procedure: LYSIS OF ADHESION;  Surgeon: Signe Mitzie LABOR, MD;  Location: [facility] OR;  Service: General;;   POSTERIOR CERVICAL FUSION/FORAMINOTOMY  X 2   TOTAL HIP ARTHROPLASTY Right 01/01/2024   Procedure: ARTHROPLASTY, HIP, TOTAL, ANTERIOR APPROACH;  Surgeon: Jerri Kay HERO, MD;  Location: [facility] OR;  Service: Orthopedics;  Laterality: Right;  3-C   Patient Active Problem List   Diagnosis Date Noted   Status post total replacement of right hip 01/01/2024   Primary osteoarthritis of right hip 12/31/2023   Hx SBO 04/12/2021   Wound dehiscence, surgical 04/12/2021   Essential hypertension    Diabetes mellitus type 2 in nonobese (HCC)    Debility 03/25/2021   Malnutrition of moderate degree 03/11/2021   Left hand pain 06/22/2020   Back pain 05/20/2020   Neck pain 08/27/2019   CKD (chronic kidney disease), 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   Hepatic steatosis 05/01/2019   Prostate hypertrophy 12/06/2018   Sleep apnea-like behavior 10/01/2014   Peripheral vascular disease (HCC) 02/10/2014   Microcytic anemia 02/06/2013   CTEV (congenital talipes equinovarus)    Statin intolerance 06/29/2012   Esophageal reflux 06/29/2012   Health care maintenance 06/28/2012   Gout 06/12/2012   Obesity, Class II, BMI  35-39.9 02/20/2012   DOE (dyspnea on exertion) 02/20/2012   Diabetes mellitus due to underlying condition, uncontrolled, with hyperglycemia (HCC) 09/06/2009   ERECTILE DYSFUNCTION, NON-ORGANIC 02/16/2009   Hyperlipemia 01/14/2009   HYPERTENSION 01/14/2009    PCP: Maree Leni Edyth DELENA, MD  REFERRING PROVIDER: Jule Ronal CROME, PA-C  REFERRING DIAG: [phone] (ICD-10-CM) - Status post total replacement of right hip   THERAPY DIAG:  Muscle weakness (generalized)  Stiffness of right hip, not elsewhere classified  Pain in right hip  Other abnormalities of gait and mobility  Unsteadiness on  feet  Rationale for Evaluation and Treatment: Rehabilitation  ONSET DATE: 01/01/2024  right THA sg  SUBJECTIVE:   SUBJECTIVE STATEMENT: He continues to do his exercises.  He lost his cane on Friday so has been walking without device mainly in his house with no change in pain. That pain that woke him up prior to last PT session has not come back.   PERTINENT HISTORY: right THA 01/01/24, OA, club foot deformities BLEs, CAD, Congenital Talipes Equinovarus, gout, HLD, HTN, MI, DM2  Patient underwent a right Total Hip Arthroplasty anterior approach on 01/01/2024 due to OA. HHPT until 02/26/2024. At office visit on 02/20/2024, he was still having posterior lateral hip pain.   DIAGNOSTIC FINDINGS: post-op X-rays show no abnormalities s/p THA  PAIN:  NPRS scale:  since last PT 1-2/10 Pain location: right hip posterior lateral Pain description: achy Aggravating factors: sitting or laying on left side,  Relieving factors: muscle relaxer, heating pad, pain meds when really needs it  PRECAUTIONS: Anterior hip  WEIGHT BEARING RESTRICTIONS: No  FALLS:  Has patient fallen in last 6 months? No  LIVING ENVIRONMENT: Lives with: lives alone Lives in: [place] one story Stairs: Yes: External: 4 steps; can reach both Has following equipment at home: Single point cane, Environmental Consultant - 2 wheeled, Wheelchair (manual), Graybar electric, Grab bars, and Ramped entry  OCCUPATION:  retired on disability  PLOF: Independent  PATIENT GOALS:   to function with hip pain, walk well in community, clean house  Next MD visit:  03/26/2024  OBJECTIVE:   PATIENT SURVEYS:  Patient-Specific Activity Scoring Scheme  0 represents "unable to perform." 10 represents "able to perform at prior level. 0 1 2 3 4 5 6 7 8 9  10 (Date and Score)  Activity Eval     1. Walking  4     2. Sleeping  2     3.  Cleaning the house 2   4.    5.    Score 2.67    Total score = sum of the activity scores/number of activities Minimum  detectable change (90%CI) for average score = 2 points Minimum detectable change (90%CI) for single activity score = 3 points  COGNITION: Overall cognitive status: WFL    SENSATION: WFL but has numbness in thigh ant-lat (feels touch)   EDEMA:  Patient appears to have localized edema present around the right hip especially laterally.  MUSCLE LENGTH: Hamstrings: Not formally tested but appears to be tight bilaterally Debby test: Not formally tested but appears to be tight bilaterally  POSTURE:  rounded shoulders, forward head, decreased lumbar lordosis, flexed trunk , and weight shift left  PALPATION: Scar has slight adhesion with rope feeling;  tenderness to posterior lateral hip esp over Piriformis & Glut Med  LOWER EXTREMITY ROM:   ROM Right eval Left eval  Hip flexion Standing  A: 61*   Hip extension Standing  A: 1*   Hip abduction  Standing  A: 2* Prior to leaning   Hip adduction    Hip internal rotation    Hip external rotation    Knee flexion    Knee extension    Ankle dorsiflexion    Ankle plantarflexion    Ankle inversion    Ankle eversion     (Blank rows = not tested)  LOWER EXTREMITY MMT:  MMT Right eval Left eval  Hip flexion 3-/5 5/5  Hip extension 3-/5 5/5  Hip abduction 3-/5 5/5  Hip adduction    Hip internal rotation    Hip external rotation    Knee flexion 4/5 5/5  Knee extension 4/5 5/5  Ankle dorsiflexion    Ankle plantarflexion    Ankle inversion    Ankle eversion     (Blank rows = not tested)   FUNCTIONAL TESTS:  18 inch chair transfer: able to arise without UE assist but uses back of legs against chair to stabilize 5 times Sit to/from stand:  22.19 sec  Timed up and go with cane 12.69 seconds and without an assistive device 11.97 seconds with supervision.  Gait without a device patient had significantly increased gait dynamics indicating high fall risk.  GAIT: Distance walked: > 100' with cane and 20' without an assistive  device Assistive device utilized: Single point cane and None Level of assistance: SBA Comments: Patient has significant antalgic gait with decreased stance duration RLE, abducted gait, step to gait pattern, early lacking trailing limb hip extension.                                                                                                                                                                       TODAY'S TREATMENT                                                                          DATE: 03/18/2024 Therapeutic Exercise:  recumbent bike seat 7 level 4 for 6 min working on range Standing red theraband hip strengthening with unilateral UE support chair back with each LE 10 reps then without UE support (PT CGA) 5 reps with each LE:  flexion (straight leg), abduction, extension and adduction.   Therapeutic Activities:  Bilat leg press 2x10 with 125#  Unilat leg press 2x15 with 62# each side  Squat over chair with BUE support on rail 10 reps.  Partial squat with 12# kettle bell 5 reps 2 sets Functional squat to pick up 5 items from flor  with supervision Slider with stance knee flexion on outward motion and ext on inward motion to 3 cones (ant-lat, lat, post-lat) with ipsilateral UE support on sink 5 reps ea.   TREATMENT                                                                          DATE: 03/13/2024 Therapeutic Exercise:  recumbent bike seat 7 level 4 for 6 min working on range Standing red theraband hip strengthening with BUE support (chair back & cane) with each LE 10 reps flexion (straight leg), abduction, extension and adduction.   Therapeutic Activities:  Bilat leg press 2x15 with 112#  Unilat leg press 2x15 with 56# each side  Squat over chair with BUE support on sink 10 reps.  PT demo & verbal cues on sit to stand and stand to sit technique. Pt performed 10 reps from 18 chair without armrests using BUEs on seat.   Manual Therapy Cupping and soft tissue  mobs to right anterior hip scar.     TREATMENT                                                                          DATE: 03/07/2024 Therapeutic Exercise:  Gastroc stretch step heel depression with ea LE 30 sec hold 3 reps Hamstring stretch foot on step with ea LE 30 sec hold 3 reps Seated piriformis stretch knee to opposite shoulder with ea LE 30 sec hold 3 reps PT added above stretches to HEP including HO. Pt verbalized understanding after performing & review of HO.  Therapeutic Activities:  Bilat leg press 2x15 with 100#  Unilat leg press 2x15 with 50# each side  RLE Forward / backward Step ups 6 step 1x15 with bilat UE use. PT tactile & verbal cues to keep weight over rt foot to facilitate engaging LE muscles > UE use. mountain climber at counter plank position 5 reps 2 sets.   Neuromuscular Re-Education: performed with CGA within parallel bars  Rocker board (square with double pivot) PT demo & verbal cues on technique and mirror to facilitate upright posture.  1 min ea: ant/midline/post and right/midline/left 1 rep BUE support //bars and 2nd rep with mid-mod A without UE support. Narrow stance for 30s with no increase in sway  Standing crossways on foam beam head turn right/left and up/down with PT minA / tactile cues and mirror for visual feedback.  PT working on hip strategy for balance reactions. Tandem gait with counter and cane support 3 laps Braiding with counter support 3 laps   TREATMENT  DATE: 03/07/2024 TherEx:  Recumbent bike level 4 for 6 minutes ; increased to level 6 at 2.5 minutes   TherAct:  Step ups 1x12 with bilat UE use Bilat leg press 2x15 with 100#  Unilat leg press 2x15 with 50# each side   Neuro Re-Ed: performed with CGA within parallel bars  Narrow stance for 30s with no increase in sway  Attempted tandem stance switching feet between, unable to perform without UE support   Semi-tandem stance 3x30s each foot fwd with increase in ankle strategy and intermittent LOB requiring UE support on parallel bars  Narrow stance with horizontal head turns x20 with no increase in sway  Narrow stance with vertical head turns x20 with no increase in sway  PT discussed 3 balance systems and how strengthening balance in a static manner before dynamic is important    HOME EXERCISE PROGRAM: Access Code: [email or web address] URL: [email or web address] Date: 03/13/2024 Prepared by: Grayce Spatz  Exercises - Standing Hip Abduction: Alternating With Resistance Loop  - 1 x daily - 7 x weekly - 2 sets - 10 reps - 5 seconds hold - Hip Extension with Resistance Loop  - 1 x daily - 7 x weekly - 2 sets - 10 reps - 5 seconds hold - Standing Hip Flexion with Resistance Loop  - 1 x daily - 7 x weekly - 2 sets - 10 reps - 5 seconds hold - Standing Marching  - 1 x daily - 7 x weekly - 2 sets - 10 reps - 5 seconds hold - Sit to Stand with Armchair  - 1 x daily - 7 x weekly - 3 sets - 10 reps - Gastroc Stretch on Step  - 1-2 x daily - 7 x weekly - 1 sets - 3 reps - 30 seconds hold - Standing Hamstring Stretch with Step  - 1-2 x daily - 7 x weekly - 1 sets - 3 reps - 30 seconds hold - Seated Piriformis Stretch  - 1-2 x daily - 7 x weekly - 1 sets - 3 reps - 30 seconds hold - Tandem Walking with Counter Support  - 1 x daily - 7 x weekly - 3 sets - 10 reps - [place] with Counter Support  - 1 x daily - 3 x weekly - 3 sets - 10 reps - [employer] on Counter  - 1 x daily - 7 x weekly - 12 sets - 10 reps  ASSESSMENT:  CLINICAL IMPRESSION: Patient improved functional activities with repetition but initially he has difficulty with activities.  He continues to report pain limiting him.  Patient will continue to benefit from skilled PT.   OBJECTIVE IMPAIRMENTS: Abnormal gait, decreased activity tolerance, decreased balance, decreased endurance, decreased knowledge of condition, decreased  knowledge of use of DME, decreased mobility, difficulty walking, decreased ROM, decreased strength, increased edema, increased muscle spasms, impaired flexibility, postural dysfunction, obesity, and pain.   ACTIVITY LIMITATIONS: carrying, lifting, bending, sitting, standing, squatting, sleeping, stairs, transfers, bed mobility, and locomotion level  PARTICIPATION LIMITATIONS: meal prep, cleaning, laundry, shopping, and community activity  PERSONAL FACTORS: Age, Fitness, Past/current experiences, Time since onset of injury/illness/exacerbation, and 3+ comorbidities: see PMH are also affecting patient's functional outcome.   REHAB POTENTIAL: Good  CLINICAL DECISION MAKING: Stable/uncomplicated  EVALUATION COMPLEXITY: Low   GOALS: Goals reviewed with patient? Yes  SHORT TERM GOALS: (target date for Short term goals 03/28/2024)   1.  Patient will demonstrate independent use of home exercise program to maintain progress from in clinic  treatments. Goal status: Ongoing     03/18/2024  2.  Patient reports right hip pain <5/10 with activities including sleeping Goal status: Ongoing     03/18/2024  LONG TERM GOALS: (target dates for all long term goals  05/01/2024 )   1. Patient will demonstrate/report pain at worst less than or equal to 2/10 to facilitate minimal limitation in daily activity secondary to pain symptoms. Goal status: Ongoing     03/18/2024   2. Patient will demonstrate independent use of home exercise program to facilitate ability to maintain/progress functional gains from skilled physical therapy services. Goal status: Ongoing     03/18/2024   3. Patient will demonstrate Patient specific functional scale avg > or = 6 to indicate reduced disability due to condition.  Goal status: Ongoing    03/18/2024   4.  Patient will demonstrate right hip LE MMT 5/5 throughout to faciltiate usual transfers, stairs, squatting at [facility] for daily life.  Goal status: Ongoing    03/18/2024    5.  Patient ambulates >500' and negotiate ramps and curbs without an assistive device independently. Goal status: Ongoing    03/18/2024   6.  Patient negotiates stairs with single rail alternating pattern modified independent. Goal status:   Ongoing   03/18/2024    PLAN:  PT FREQUENCY: 2x/week  PT DURATION: 10 weeks  PLANNED INTERVENTIONS: [id number]- PT Re-evaluation, 97750- Physical Performance Testing, 97110-Therapeutic exercises, 97530- Therapeutic activity, [id number]- Neuromuscular re-education, 97535- Self Care, [id number]- Manual therapy, [id number]- Gait training, [phone]- Electrical stimulation (manual), [id number]- Ultrasound, [id number]- Ionotophoresis 4mg /ml Dexamethasone , [id number] (1-2 muscles), [id number] (3+ muscles)- Dry Needling, Patient/Family education, Balance training, Stair training, Taping, Scar mobilization, DME instructions, Cryotherapy, and Moist heat  PLAN FOR NEXT SESSION:   check right hip AROM.    LE strengthening, scar mobs with cup, LE flexibility, balance    Grayce Spatz, PT, DPT 03/18/2024, 3:37 PM

## 2024-03-19 NOTE — Therapy (Signed)
 OUTPATIENT PHYSICAL THERAPY LOWER EXTREMITY TREATMENT   Patient Name: Daniel Perkins MRN: [id number] DOB:April 04, 1956, 68 y.o., male Today's Date: 03/20/2024  END OF SESSION:  PT End of Session - 03/20/24 0850     Visit Number 8    Number of Visits 20    Date for Recertification  05/01/24    Authorization Type Medicare Advantage    Progress Note Due on Visit 10    PT Start Time 0848    PT Stop Time 0928    PT Time Calculation (min) 40 min    Equipment Utilized During Treatment Gait belt    Activity Tolerance Patient tolerated treatment well;Patient limited by pain    Behavior During Therapy WFL for tasks assessed/performed           Past Medical History:  Diagnosis Date   Arthritis    Club foot of both lower extremities 1958   Coronary artery disease    Holter monitor 02/2012 - sinus with rare PVC   CTEV (congenital talipes equinovarus)    Gout    Hyperlipidemia    Hypertension    Myocardial infarction (HCC) ~ 2007   mild   Osteomyelitis (HCC)    Pneumonia 2000's   couple times   Pneumonia due to COVID-19 virus 04/28/2019   Type II diabetes mellitus (HCC) 2011   Past Surgical History:  Procedure Laterality Date   ANTERIOR CERVICAL DECOMP/DISCECTOMY FUSION  X 2   CARDIAC CATHETERIZATION  07/2009   CLOSED REDUCTION NASAL FRACTURE     when 68 years old   CLOSED REDUCTION NASAL FRACTURE Bilateral 09/21/2021   Procedure: CLOSED REDUCTION NASAL FRACTURE;  Surgeon: Carlie Clark, MD;  Location: [facility];  Service: ENT;  Laterality: Bilateral;   FOOT FRACTURE SURGERY Right 01/07/1989   pt a steel plate in   FOOT SURGERY Left x16   joints collapsed; keep getting infected   LAPAROSCOPY N/A 03/05/2021   Procedure: LAPAROSCOPY DIAGNOSTIC;  Surgeon: Signe Mitzie LABOR, MD;  Location: [facility] OR;  Service: General;  Laterality: N/A;   LAPAROTOMY Right 03/05/2021   Procedure: EXPLORATORY LAPAROTOMY, REPAIR SMALL BOWEL PERFORATION;  Surgeon: Signe Mitzie LABOR, MD;  Location: [facility] OR;  Service: General;  Laterality: Right;   LYSIS OF ADHESION  03/05/2021   Procedure: LYSIS OF ADHESION;  Surgeon: Signe Mitzie LABOR, MD;  Location: [facility] OR;  Service: General;;   POSTERIOR CERVICAL FUSION/FORAMINOTOMY  X 2   TOTAL HIP ARTHROPLASTY Right 01/01/2024   Procedure: ARTHROPLASTY, HIP, TOTAL, ANTERIOR APPROACH;  Surgeon: Jerri Kay HERO, MD;  Location: [facility] OR;  Service: Orthopedics;  Laterality: Right;  3-C   Patient Active Problem List   Diagnosis Date Noted   Status post total replacement of right hip 01/01/2024   Primary osteoarthritis of right hip 12/31/2023   Hx SBO 04/12/2021   Wound dehiscence, surgical 04/12/2021   Essential hypertension    Diabetes mellitus type 2 in nonobese (HCC)    Debility 03/25/2021   Malnutrition of moderate degree 03/11/2021   Left hand pain 06/22/2020   Back pain 05/20/2020   Neck pain 08/27/2019   CKD (chronic kidney disease), 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   Hepatic steatosis 05/01/2019   Prostate hypertrophy 12/06/2018   Sleep apnea-like behavior 10/01/2014   Peripheral vascular disease (HCC) 02/10/2014   Microcytic anemia 02/06/2013   CTEV (congenital talipes equinovarus)    Statin intolerance 06/29/2012   Esophageal reflux 06/29/2012   Health care maintenance 06/28/2012   Gout 06/12/2012   Obesity, Class II,  BMI 35-39.9 02/20/2012   DOE (dyspnea on exertion) 02/20/2012   Diabetes mellitus due to underlying condition, uncontrolled, with hyperglycemia (HCC) 09/06/2009   ERECTILE DYSFUNCTION, NON-ORGANIC 02/16/2009   Hyperlipemia 01/14/2009   HYPERTENSION 01/14/2009    PCP: Maree Leni Edyth DELENA, MD  REFERRING PROVIDER: Jerri Kay HERO, MD  REFERRING DIAG: [phone] (ICD-10-CM) - Status post total replacement of right hip   THERAPY DIAG:  Stiffness of right hip, not elsewhere classified  Muscle weakness (generalized)  Pain in right hip  Other abnormalities of gait and mobility  Unsteadiness on feet  Rationale  for Evaluation and Treatment: Rehabilitation  ONSET DATE: 01/01/2024  right THA sg  SUBJECTIVE:   SUBJECTIVE STATEMENT: Patient reports that he continues to not be able to put pressure on the spot that is sore.   PERTINENT HISTORY: right THA 01/01/24, OA, club foot deformities BLEs, CAD, Congenital Talipes Equinovarus, gout, HLD, HTN, MI, DM2  Patient underwent a right Total Hip Arthroplasty anterior approach on 01/01/2024 due to OA. HHPT until 02/26/2024. At office visit on 02/20/2024, he was still having posterior lateral hip pain.   DIAGNOSTIC FINDINGS: post-op X-rays show no abnormalities s/p THA  PAIN:  NPRS scale:  4/10 soreness  Pain location: right hip posterior lateral Pain description: achy Aggravating factors: sitting or laying on left side,  Relieving factors: muscle relaxer, heating pad, pain meds when really needs it  PRECAUTIONS: Anterior hip  WEIGHT BEARING RESTRICTIONS: No  FALLS:  Has patient fallen in last 6 months? No  LIVING ENVIRONMENT: Lives with: lives alone Lives in: [place] one story Stairs: Yes: External: 4 steps; can reach both Has following equipment at home: Single point cane, Environmental Consultant - 2 wheeled, Wheelchair (manual), Graybar electric, Grab bars, and Ramped entry  OCCUPATION:  retired on disability  PLOF: Independent  PATIENT GOALS:   to function with hip pain, walk well in community, clean house  Next MD visit:  03/26/2024  OBJECTIVE:   PATIENT SURVEYS:  Patient-Specific Activity Scoring Scheme  0 represents "unable to perform." 10 represents "able to perform at prior level. 0 1 2 3 4 5 6 7 8 9  10 (Date and Score)  Activity Eval     1. Walking  4     2. Sleeping  2     3.  Cleaning the house 2   4.    5.    Score 2.67    Total score = sum of the activity scores/number of activities Minimum detectable change (90%CI) for average score = 2 points Minimum detectable change (90%CI) for single activity score = 3  points  COGNITION: Overall cognitive status: WFL    SENSATION: WFL but has numbness in thigh ant-lat (feels touch)   EDEMA:  Patient appears to have localized edema present around the right hip especially laterally.  MUSCLE LENGTH: Hamstrings: Not formally tested but appears to be tight bilaterally Debby test: Not formally tested but appears to be tight bilaterally  POSTURE:  rounded shoulders, forward head, decreased lumbar lordosis, flexed trunk , and weight shift left  PALPATION: Scar has slight adhesion with rope feeling;  tenderness to posterior lateral hip esp over Piriformis & Glut Med  LOWER EXTREMITY ROM:   ROM Right eval Left eval  Hip flexion Standing  A: 61*   Hip extension Standing  A: 1*   Hip abduction Standing  A: 2* Prior to leaning   Hip adduction    Hip internal rotation    Hip external rotation  Knee flexion    Knee extension    Ankle dorsiflexion    Ankle plantarflexion    Ankle inversion    Ankle eversion     (Blank rows = not tested)  LOWER EXTREMITY MMT:  MMT Right eval Left eval  Hip flexion 3-/5 5/5  Hip extension 3-/5 5/5  Hip abduction 3-/5 5/5  Hip adduction    Hip internal rotation    Hip external rotation    Knee flexion 4/5 5/5  Knee extension 4/5 5/5  Ankle dorsiflexion    Ankle plantarflexion    Ankle inversion    Ankle eversion     (Blank rows = not tested)   FUNCTIONAL TESTS:  18 inch chair transfer: able to arise without UE assist but uses back of legs against chair to stabilize 5 times Sit to/from stand:  22.19 sec  Timed up and go with cane 12.69 seconds and without an assistive device 11.97 seconds with supervision.  Gait without a device patient had significantly increased gait dynamics indicating high fall risk.  GAIT: Distance walked: > 100' with cane and 20' without an assistive device Assistive device utilized: Single point cane and None Level of assistance: SBA Comments: Patient has significant  antalgic gait with decreased stance duration RLE, abducted gait, step to gait pattern, early lacking trailing limb hip extension.                                                                                                                                                                       TODAY'S TREATMENT                                                                          DATE: 03/20/2024 TherEx:  Recumbent bike le vel 4 for 8 minutes  PT educated and discussed on importance of bilateral and unilateral strengthening  Bilat leg press 125# for 2x12 with verbal cues for increased knee flexion; appropriate carryover  Unilat leg press 2x15 at 68# performing on both sides  Standing calf raises into toe raises 2x10 with bilat UE use on // bars  PT discussed with adding hip hikes and step fwd/back with Lt LE for home exercises to strengthen Rt LE during stance in order to improve towards not requiring SPC   Neuro Re-Ed: Step over and back with weightbearing through Rt LE trying to decrease UE use throughout to challenge balance and stance phase 2x10 Standing square taps with bilat UE use  to increase weightbearing and balance through Rt LE stance 2x6 taps ; attempting to decrease bilat UE use during second round  Standing hip hikes with mirror used for visual feedback 2x10 with both sides   TODAY'S TREATMENT                                                                          DATE: 03/18/2024 Therapeutic Exercise:  recumbent bike seat 7 level 4 for 6 min working on range Standing red theraband hip strengthening with unilateral UE support chair back with each LE 10 reps then without UE support (PT CGA) 5 reps with each LE:  flexion (straight leg), abduction, extension and adduction.   Therapeutic Activities:  Bilat leg press 2x10 with 125#  Unilat leg press 2x15 with 62# each side  Squat over chair with BUE support on rail 10 reps.  Partial squat with 12# kettle bell 5 reps 2  sets Functional squat to pick up 5 items from flor with supervision Slider with stance knee flexion on outward motion and ext on inward motion to 3 cones (ant-lat, lat, post-lat) with ipsilateral UE support on sink 5 reps ea.   TREATMENT                                                                          DATE: 03/13/2024 Therapeutic Exercise:  recumbent bike seat 7 level 4 for 6 min working on range Standing red theraband hip strengthening with BUE support (chair back & cane) with each LE 10 reps flexion (straight leg), abduction, extension and adduction.   Therapeutic Activities:  Bilat leg press 2x15 with 112#  Unilat leg press 2x15 with 56# each side  Squat over chair with BUE support on sink 10 reps.  PT demo & verbal cues on sit to stand and stand to sit technique. Pt performed 10 reps from 18 chair without armrests using BUEs on seat.   Manual Therapy Cupping and soft tissue mobs to right anterior hip scar.     TREATMENT                                                                          DATE: 03/07/2024 Therapeutic Exercise:  Gastroc stretch step heel depression with ea LE 30 sec hold 3 reps Hamstring stretch foot on step with ea LE 30 sec hold 3 reps Seated piriformis stretch knee to opposite shoulder with ea LE 30 sec hold 3 reps PT added above stretches to HEP including HO. Pt verbalized understanding after performing & review of HO.  Therapeutic Activities:  Bilat leg press 2x15 with 100#  Unilat leg press 2x15 with 50# each side  RLE Forward /  backward Step ups 6 step 1x15 with bilat UE use. PT tactile & verbal cues to keep weight over rt foot to facilitate engaging LE muscles > UE use. mountain climber at counter plank position 5 reps 2 sets.   Neuromuscular Re-Education: performed with CGA within parallel bars  Rocker board (square with double pivot) PT demo & verbal cues on technique and mirror to facilitate upright posture.  1 min ea: ant/midline/post and  right/midline/left 1 rep BUE support //bars and 2nd rep with mid-mod A without UE support. Narrow stance for 30s with no increase in sway  Standing crossways on foam beam head turn right/left and up/down with PT minA / tactile cues and mirror for visual feedback.  PT working on hip strategy for balance reactions. Tandem gait with counter and cane support 3 laps Braiding with counter support 3 laps   TREATMENT                                                                          DATE: 03/07/2024 TherEx:  Recumbent bike level 4 for 6 minutes ; increased to level 6 at 2.5 minutes   TherAct:  Step ups 1x12 with bilat UE use Bilat leg press 2x15 with 100#  Unilat leg press 2x15 with 50# each side   Neuro Re-Ed: performed with CGA within parallel bars  Narrow stance for 30s with no increase in sway  Attempted tandem stance switching feet between, unable to perform without UE support  Semi-tandem stance 3x30s each foot fwd with increase in ankle strategy and intermittent LOB requiring UE support on parallel bars  Narrow stance with horizontal head turns x20 with no increase in sway  Narrow stance with vertical head turns x20 with no increase in sway  PT discussed 3 balance systems and how strengthening balance in a static manner before dynamic is important    HOME EXERCISE PROGRAM: Access Code: [email or web address] URL: [email or web address] Date: 03/13/2024 Prepared by: Grayce Spatz  Exercises - Standing Hip Abduction: Alternating With Resistance Loop  - 1 x daily - 7 x weekly - 2 sets - 10 reps - 5 seconds hold - Hip Extension with Resistance Loop  - 1 x daily - 7 x weekly - 2 sets - 10 reps - 5 seconds hold - Standing Hip Flexion with Resistance Loop  - 1 x daily - 7 x weekly - 2 sets - 10 reps - 5 seconds hold - Standing Marching  - 1 x daily - 7 x weekly - 2 sets - 10 reps - 5 seconds hold - Sit to Stand with Armchair  - 1 x daily - 7 x weekly - 3 sets - 10 reps - Gastroc  Stretch on Step  - 1-2 x daily - 7 x weekly - 1 sets - 3 reps - 30 seconds hold - Standing Hamstring Stretch with Step  - 1-2 x daily - 7 x weekly - 1 sets - 3 reps - 30 seconds hold - Seated Piriformis Stretch  - 1-2 x daily - 7 x weekly - 1 sets - 3 reps - 30 seconds hold - Tandem Walking with Counter Support  - 1 x daily - 7 x weekly - 3 sets - 10 reps -  Carioca with Counter Support  - 1 x daily - 3 x weekly - 3 sets - 10 reps - [employer] on Counter  - 1 x daily - 7 x weekly - 12 sets - 10 reps  ASSESSMENT:  CLINICAL IMPRESSION: Patient arrived to session noting continued pain with pressure in the Rt hip but feels an improvement overall. Patient tolerated all activities this date.  Patient will continue to benefit from skilled PT.   OBJECTIVE IMPAIRMENTS: Abnormal gait, decreased activity tolerance, decreased balance, decreased endurance, decreased knowledge of condition, decreased knowledge of use of DME, decreased mobility, difficulty walking, decreased ROM, decreased strength, increased edema, increased muscle spasms, impaired flexibility, postural dysfunction, obesity, and pain.   ACTIVITY LIMITATIONS: carrying, lifting, bending, sitting, standing, squatting, sleeping, stairs, transfers, bed mobility, and locomotion level  PARTICIPATION LIMITATIONS: meal prep, cleaning, laundry, shopping, and community activity  PERSONAL FACTORS: Age, Fitness, Past/current experiences, Time since onset of injury/illness/exacerbation, and 3+ comorbidities: see PMH are also affecting patient's functional outcome.   REHAB POTENTIAL: Good  CLINICAL DECISION MAKING: Stable/uncomplicated  EVALUATION COMPLEXITY: Low   GOALS: Goals reviewed with patient? Yes  SHORT TERM GOALS: (target date for Short term goals 03/28/2024)   1.  Patient will demonstrate independent use of home exercise program to maintain progress from in clinic treatments. Goal status: Ongoing     03/18/2024  2.  Patient  reports right hip pain <5/10 with activities including sleeping Goal status: Ongoing     03/18/2024  LONG TERM GOALS: (target dates for all long term goals  05/01/2024 )   1. Patient will demonstrate/report pain at worst less than or equal to 2/10 to facilitate minimal limitation in daily activity secondary to pain symptoms. Goal status: Ongoing     03/18/2024   2. Patient will demonstrate independent use of home exercise program to facilitate ability to maintain/progress functional gains from skilled physical therapy services. Goal status: Ongoing     03/18/2024   3. Patient will demonstrate Patient specific functional scale avg > or = 6 to indicate reduced disability due to condition.  Goal status: Ongoing    03/18/2024   4.  Patient will demonstrate right hip LE MMT 5/5 throughout to faciltiate usual transfers, stairs, squatting at [facility] for daily life.  Goal status: Ongoing    03/18/2024   5.  Patient ambulates >500' and negotiate ramps and curbs without an assistive device independently. Goal status: Ongoing    03/18/2024   6.  Patient negotiates stairs with single rail alternating pattern modified independent. Goal status:   Ongoing   03/18/2024    PLAN:  PT FREQUENCY: 2x/week  PT DURATION: 10 weeks  PLANNED INTERVENTIONS: 97164- PT Re-evaluation, 97750- Physical Performance Testing, 97110-Therapeutic exercises, 97530- Therapeutic activity, [id number]- Neuromuscular re-education, 97535- Self Care, [id number]- Manual therapy, [id number]- Gait training, [phone]- Electrical stimulation (manual), [id number]- Ultrasound, [id number]- Ionotophoresis 4mg /ml Dexamethasone , 79439 (1-2 muscles), 20561 (3+ muscles)- Dry Needling, Patient/Family education, Balance training, Stair training, Taping, Scar mobilization, DME instructions, Cryotherapy, and Moist heat  PLAN FOR NEXT SESSION:    check right hip AROM.    LE strengthening, scar mobs with cup, LE flexibility, balance    Susannah Daring, PT, DPT 03/20/24 10:27  AM

## 2024-03-20 ENCOUNTER — Ambulatory Visit: Payer: Medicare (Managed Care)

## 2024-03-20 DIAGNOSIS — M6281 Muscle weakness (generalized): Secondary | ICD-10-CM | POA: Diagnosis not present

## 2024-03-20 DIAGNOSIS — R2689 Other abnormalities of gait and mobility: Secondary | ICD-10-CM

## 2024-03-20 DIAGNOSIS — R2681 Unsteadiness on feet: Secondary | ICD-10-CM

## 2024-03-20 DIAGNOSIS — M25651 Stiffness of right hip, not elsewhere classified: Secondary | ICD-10-CM | POA: Diagnosis not present

## 2024-03-20 DIAGNOSIS — M25551 Pain in right hip: Secondary | ICD-10-CM

## 2024-03-26 ENCOUNTER — Ambulatory Visit: Payer: Medicare (Managed Care) | Admitting: Physician Assistant

## 2024-03-26 DIAGNOSIS — Z96641 Presence of right artificial hip joint: Secondary | ICD-10-CM

## 2024-03-26 MED ORDER — METHYLPREDNISOLONE 4 MG PO TBPK: ORAL_TABLET | ORAL | 0 refills | Status: AC

## 2024-03-26 MED ORDER — METHOCARBAMOL 500 MG PO TABS
500.0000 mg | ORAL_TABLET | Freq: Two times a day (BID) | ORAL | 2 refills | Status: AC | PRN
Start: 2024-03-26 — End: ?

## 2024-03-26 NOTE — Progress Notes (Signed)
 Post-Op Visit Note   Patient: Daniel Perkins           Date of Birth: 01/30/1956           MRN: [id number] Visit Date: 03/26/2024 PCP: Maree Leni Edyth DELENA, MD   Assessment & Plan:  Chief Complaint:  Chief Complaint  Patient presents with   Right Hip - Follow-up    Right THA 01/01/2024   Visit Diagnoses:  1. Status post total replacement of right hip     Plan: Patient is a pleasant 68 year old gentleman who comes in today 3 months status post right total hip replacement.  He has been doing well in regards to his hip.  He is having pain to the right buttock which he notes has been ongoing since surgery.  He has been in physical therapy where they have been working on balance and strengthening exercises.  He is taking oxycodone  for pain for which he gets from PCP.  Examination of his right hip reveals painless hip flexion and logroll.  He does have pain with straight leg raise on the right.  No focal weakness.  He is neurovascularly intact distally.  At this point, I feel his hip is doing well.  He is having pain to the right buttock consistent with lumbar pathology.  Have discussed starting him on a Medrol  Dosepak and a muscle relaxer.  He has agreed to monitor his blood sugar extra closely for the next week.  Follow-up in 3 months for recheck.  Call with concerns or questions.  Follow-Up Instructions: Return in about 3 months (around 06/26/2024).   Orders:  No orders of the defined types were placed in this encounter.  Meds ordered this encounter  Medications   methylPREDNISolone  (MEDROL  DOSEPAK) 4 MG TBPK tablet    Sig: Take as directed    Dispense:  21 tablet    Refill:  0   methocarbamol  (ROBAXIN ) 500 MG tablet    Sig: Take 1 tablet (500 mg total) by mouth 2 (two) times daily as needed.    Dispense:  20 tablet    Refill:  2    Imaging: No new imaging  PMFS History: Patient Active Problem List   Diagnosis Date Noted   Status post total replacement of right hip  01/01/2024   Primary osteoarthritis of right hip 12/31/2023   Hx SBO 04/12/2021   Wound dehiscence, surgical 04/12/2021   Essential hypertension    Diabetes mellitus type 2 in nonobese (HCC)    Debility 03/25/2021   Malnutrition of moderate degree 03/11/2021   Left hand pain 06/22/2020   Back pain 05/20/2020   Neck pain 08/27/2019   CKD (chronic kidney disease), 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   Hepatic steatosis 05/01/2019   Prostate hypertrophy 12/06/2018   Sleep apnea-like behavior 10/01/2014   Peripheral vascular disease (HCC) 02/10/2014   Microcytic anemia 02/06/2013   CTEV (congenital talipes equinovarus)    Statin intolerance 06/29/2012   Esophageal reflux 06/29/2012   Health care maintenance 06/28/2012   Gout 06/12/2012   Obesity, Class II, BMI 35-39.9 02/20/2012   DOE (dyspnea on exertion) 02/20/2012   Diabetes mellitus due to underlying condition, uncontrolled, with hyperglycemia (HCC) 09/06/2009   ERECTILE DYSFUNCTION, NON-ORGANIC 02/16/2009   Hyperlipemia 01/14/2009   HYPERTENSION 01/14/2009   Past Medical History:  Diagnosis Date   Arthritis    Club foot of both lower extremities 1958   Coronary artery disease    Holter monitor 02/2012 - sinus with rare PVC   CTEV (  congenital talipes equinovarus)    Gout    Hyperlipidemia    Hypertension    Myocardial infarction [facility]) ~ 2007   mild   Osteomyelitis (HCC)    Pneumonia 2000's   couple times   Pneumonia due to COVID-19 virus 04/28/2019   Type II diabetes mellitus (HCC) 2011    Family History  Problem Relation Age of Onset   Diabetes Mother    Coronary artery disease Mother        HAS PACEMAKER   Heart disease Mother    Heart attack Brother    Lung disease Neg Hx     Past Surgical History:  Procedure Laterality Date   ANTERIOR CERVICAL DECOMP/DISCECTOMY FUSION  X 2   CARDIAC CATHETERIZATION  07/2009   CLOSED REDUCTION NASAL FRACTURE     when 68 years old   CLOSED REDUCTION NASAL FRACTURE Bilateral  09/21/2021   Procedure: CLOSED REDUCTION NASAL FRACTURE;  Surgeon: Carlie Clark, MD;  Location: [facility];  Service: ENT;  Laterality: Bilateral;   FOOT FRACTURE SURGERY Right 01/07/1989   pt a steel plate in   FOOT SURGERY Left x16   joints collapsed; keep getting infected   LAPAROSCOPY N/A 03/05/2021   Procedure: LAPAROSCOPY DIAGNOSTIC;  Surgeon: Signe Mitzie LABOR, MD;  Location: [facility] OR;  Service: General;  Laterality: N/A;   LAPAROTOMY Right 03/05/2021   Procedure: EXPLORATORY LAPAROTOMY, REPAIR SMALL BOWEL PERFORATION;  Surgeon: Signe Mitzie LABOR, MD;  Location: [facility] OR;  Service: General;  Laterality: Right;   LYSIS OF ADHESION  03/05/2021   Procedure: LYSIS OF ADHESION;  Surgeon: Signe Mitzie LABOR, MD;  Location: [facility] OR;  Service: General;;   POSTERIOR CERVICAL FUSION/FORAMINOTOMY  X 2   TOTAL HIP ARTHROPLASTY Right 01/01/2024   Procedure: ARTHROPLASTY, HIP, TOTAL, ANTERIOR APPROACH;  Surgeon: Jerri Kay HERO, MD;  Location: [facility] OR;  Service: Orthopedics;  Laterality: Right;  3-C   Social History   Occupational History    Employer: UNEMPLOYED   Occupation: unemployed  Tobacco Use   Smoking status: Former    Current packs/day: 0.00    Average packs/day: 1 pack/day for 32.0 years (32.0 ttl pk-yrs)    Types: Cigarettes    Start date: 07/06/1975    Quit date: 07/06/2007    Years since quitting: 16.7   Smokeless tobacco: Never  Vaping Use   Vaping status: Never Used  Substance and Sexual Activity   Alcohol use: Not Currently   Drug use: No   Sexual activity: Yes    Partners: Female

## 2024-04-02 ENCOUNTER — Encounter: Payer: Medicare (Managed Care) | Admitting: Physical Therapy

## 2024-04-02 NOTE — Therapy (Incomplete)
 OUTPATIENT PHYSICAL THERAPY LOWER EXTREMITY TREATMENT   Patient Name: Daniel Perkins MRN: [id number] DOB:Oct 13, 1955, 68 y.o., male Today's Date: 04/02/2024  END OF SESSION:     Past Medical History:  Diagnosis Date   Arthritis    Club foot of both lower extremities 1958   Coronary artery disease    Holter monitor 02/2012 - sinus with rare PVC   CTEV (congenital talipes equinovarus)    Gout    Hyperlipidemia    Hypertension    Myocardial infarction (HCC) ~ 2007   mild   Osteomyelitis (HCC)    Pneumonia 2000's   couple times   Pneumonia due to COVID-19 virus 04/28/2019   Type II diabetes mellitus (HCC) 2011   Past Surgical History:  Procedure Laterality Date   ANTERIOR CERVICAL DECOMP/DISCECTOMY FUSION  X 2   CARDIAC CATHETERIZATION  07/2009   CLOSED REDUCTION NASAL FRACTURE     when 68 years old   CLOSED REDUCTION NASAL FRACTURE Bilateral 09/21/2021   Procedure: CLOSED REDUCTION NASAL FRACTURE;  Surgeon: Carlie Clark, MD;  Location: [facility];  Service: ENT;  Laterality: Bilateral;   FOOT FRACTURE SURGERY Right 01/07/1989   pt a steel plate in   FOOT SURGERY Left x16   joints collapsed; keep getting infected   LAPAROSCOPY N/A 03/05/2021   Procedure: LAPAROSCOPY DIAGNOSTIC;  Surgeon: Signe Mitzie LABOR, MD;  Location: [facility] OR;  Service: General;  Laterality: N/A;   LAPAROTOMY Right 03/05/2021   Procedure: EXPLORATORY LAPAROTOMY, REPAIR SMALL BOWEL PERFORATION;  Surgeon: Signe Mitzie LABOR, MD;  Location: [facility] OR;  Service: General;  Laterality: Right;   LYSIS OF ADHESION  03/05/2021   Procedure: LYSIS OF ADHESION;  Surgeon: Signe Mitzie LABOR, MD;  Location: [facility] OR;  Service: General;;   POSTERIOR CERVICAL FUSION/FORAMINOTOMY  X 2   TOTAL HIP ARTHROPLASTY Right 01/01/2024   Procedure: ARTHROPLASTY, HIP, TOTAL, ANTERIOR APPROACH;  Surgeon: Jerri Kay HERO, MD;  Location: [facility] OR;  Service: Orthopedics;  Laterality: Right;  3-C   Patient Active Problem  List   Diagnosis Date Noted   Status post total replacement of right hip 01/01/2024   Primary osteoarthritis of right hip 12/31/2023   Hx SBO 04/12/2021   Wound dehiscence, surgical 04/12/2021   Essential hypertension    Diabetes mellitus type 2 in nonobese (HCC)    Debility 03/25/2021   Malnutrition of moderate degree 03/11/2021   Left hand pain 06/22/2020   Back pain 05/20/2020   Neck pain 08/27/2019   CKD (chronic kidney disease), 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   Hepatic steatosis 05/01/2019   Prostate hypertrophy 12/06/2018   Sleep apnea-like behavior 10/01/2014   Peripheral vascular disease (HCC) 02/10/2014   Microcytic anemia 02/06/2013   CTEV (congenital talipes equinovarus)    Statin intolerance 06/29/2012   Esophageal reflux 06/29/2012   Health care maintenance 06/28/2012   Gout 06/12/2012   Obesity, Class II, BMI 35-39.9 02/20/2012   DOE (dyspnea on exertion) 02/20/2012   Diabetes mellitus due to underlying condition, uncontrolled, with hyperglycemia (HCC) 09/06/2009   ERECTILE DYSFUNCTION, NON-ORGANIC 02/16/2009   Hyperlipemia 01/14/2009   HYPERTENSION 01/14/2009    PCP: Maree Leni Edyth LABOR, MD  REFERRING PROVIDER: Jule Ronal CROME, PA-C  REFERRING DIAG: [phone] (ICD-10-CM) - Status post total replacement of right hip   THERAPY DIAG:  No diagnosis found.  Rationale for Evaluation and Treatment: Rehabilitation  ONSET DATE: 01/01/2024  right THA sg  SUBJECTIVE:   SUBJECTIVE STATEMENT: ***  Patient reports that he continues to not be able  to put pressure on the spot that is sore.   PERTINENT HISTORY: right THA 01/01/24, OA, club foot deformities BLEs, CAD, Congenital Talipes Equinovarus, gout, HLD, HTN, MI, DM2  Patient underwent a right Total Hip Arthroplasty anterior approach on 01/01/2024 due to OA. HHPT until 02/26/2024. At office visit on 02/20/2024, he was still having posterior lateral hip pain.   DIAGNOSTIC FINDINGS: post-op X-rays show no abnormalities s/p  THA  PAIN:  NPRS scale:  *** 4/10 soreness  Pain location: right hip posterior lateral Pain description: achy Aggravating factors: sitting or laying on left side,  Relieving factors: muscle relaxer, heating pad, pain meds when really needs it  PRECAUTIONS: Anterior hip  WEIGHT BEARING RESTRICTIONS: No  FALLS:  Has patient fallen in last 6 months? No  LIVING ENVIRONMENT: Lives with: lives alone Lives in: [place] one story Stairs: Yes: External: 4 steps; can reach both Has following equipment at home: Single point cane, Environmental Consultant - 2 wheeled, Wheelchair (manual), Graybar electric, Grab bars, and Ramped entry  OCCUPATION:  retired on disability  PLOF: Independent  PATIENT GOALS:   to function with hip pain, walk well in community, clean house  Next MD visit:  03/26/2024  OBJECTIVE:   PATIENT SURVEYS:  Patient-Specific Activity Scoring Scheme  0 represents "unable to perform." 10 represents "able to perform at prior level. 0 1 2 3 4 5 6 7 8 9  10 (Date and Score)  Activity Eval     1. Walking  4     2. Sleeping  2     3.  Cleaning the house 2   4.    5.    Score 2.67    Total score = sum of the activity scores/number of activities Minimum detectable change (90%CI) for average score = 2 points Minimum detectable change (90%CI) for single activity score = 3 points  COGNITION: Overall cognitive status: WFL    SENSATION: WFL but has numbness in thigh ant-lat (feels touch)   EDEMA:  Patient appears to have localized edema present around the right hip especially laterally.  MUSCLE LENGTH: Hamstrings: Not formally tested but appears to be tight bilaterally Debby test: Not formally tested but appears to be tight bilaterally  POSTURE:  rounded shoulders, forward head, decreased lumbar lordosis, flexed trunk , and weight shift left  PALPATION: Scar has slight adhesion with rope feeling;  tenderness to posterior lateral hip esp over Piriformis & Glut Med  LOWER  EXTREMITY ROM:   ROM Right eval Left eval  Hip flexion Standing  A: 61*   Hip extension Standing  A: 1*   Hip abduction Standing  A: 2* Prior to leaning   Hip adduction    Hip internal rotation    Hip external rotation    Knee flexion    Knee extension    Ankle dorsiflexion    Ankle plantarflexion    Ankle inversion    Ankle eversion     (Blank rows = not tested)  LOWER EXTREMITY MMT:  MMT Right eval Left eval  Hip flexion 3-/5 5/5  Hip extension 3-/5 5/5  Hip abduction 3-/5 5/5  Hip adduction    Hip internal rotation    Hip external rotation    Knee flexion 4/5 5/5  Knee extension 4/5 5/5  Ankle dorsiflexion    Ankle plantarflexion    Ankle inversion    Ankle eversion     (Blank rows = not tested)   FUNCTIONAL TESTS:  18 inch chair transfer: able to  arise without UE assist but uses back of legs against chair to stabilize 5 times Sit to/from stand:  22.19 sec  Timed up and go with cane 12.69 seconds and without an assistive device 11.97 seconds with supervision.  Gait without a device patient had significantly increased gait dynamics indicating high fall risk.  GAIT: Distance walked: > 100' with cane and 20' without an assistive device Assistive device utilized: Single point cane and None Level of assistance: SBA Comments: Patient has significant antalgic gait with decreased stance duration RLE, abducted gait, step to gait pattern, early lacking trailing limb hip extension.                                                                                                                                                                       TODAY'S TREATMENT                                                                          DATE: 04/02/2024 Therapeutic Exercise:  ***  Recumbent bike level 4 for 8 minutes  PT educated and discussed on importance of bilateral and unilateral strengthening  Bilat leg press 125# for 2x12 with verbal cues for increased knee  flexion; appropriate carryover  Unilat leg press 2x15 at 68# performing on both sides  Standing calf raises into toe raises 2x10 with bilat UE use on // bars  PT discussed with adding hip hikes and step fwd/back with Lt LE for home exercises to strengthen Rt LE during stance in order to improve towards not requiring SPC   Neuromuscular Re-Education: Step over and back with weightbearing through Rt LE trying to decrease UE use throughout to challenge balance and stance phase 2x10 Standing square taps with bilat UE use to increase weightbearing and balance through Rt LE stance 2x6 taps ; attempting to decrease bilat UE use during second round  Standing hip hikes with mirror used for visual feedback 2x10 with both sides     TREATMENT                                                                          DATE: 03/20/2024 TherEx:  Recumbent bike le vel 4 for 8  minutes  PT educated and discussed on importance of bilateral and unilateral strengthening  Bilat leg press 125# for 2x12 with verbal cues for increased knee flexion; appropriate carryover  Unilat leg press 2x15 at 68# performing on both sides  Standing calf raises into toe raises 2x10 with bilat UE use on // bars  PT discussed with adding hip hikes and step fwd/back with Lt LE for home exercises to strengthen Rt LE during stance in order to improve towards not requiring SPC   Neuro Re-Ed: Step over and back with weightbearing through Rt LE trying to decrease UE use throughout to challenge balance and stance phase 2x10 Standing square taps with bilat UE use to increase weightbearing and balance through Rt LE stance 2x6 taps ; attempting to decrease bilat UE use during second round  Standing hip hikes with mirror used for visual feedback 2x10 with both sides    TREATMENT                                                                          DATE: 03/18/2024 Therapeutic Exercise:  recumbent bike seat 7 level 4 for 6 min working on  range Standing red theraband hip strengthening with unilateral UE support chair back with each LE 10 reps then without UE support (PT CGA) 5 reps with each LE:  flexion (straight leg), abduction, extension and adduction.   Therapeutic Activities:  Bilat leg press 2x10 with 125#  Unilat leg press 2x15 with 62# each side  Squat over chair with BUE support on rail 10 reps.  Partial squat with 12# kettle bell 5 reps 2 sets Functional squat to pick up 5 items from flor with supervision Slider with stance knee flexion on outward motion and ext on inward motion to 3 cones (ant-lat, lat, post-lat) with ipsilateral UE support on sink 5 reps ea.   TREATMENT                                                                          DATE: 03/13/2024 Therapeutic Exercise:  recumbent bike seat 7 level 4 for 6 min working on range Standing red theraband hip strengthening with BUE support (chair back & cane) with each LE 10 reps flexion (straight leg), abduction, extension and adduction.   Therapeutic Activities:  Bilat leg press 2x15 with 112#  Unilat leg press 2x15 with 56# each side  Squat over chair with BUE support on sink 10 reps.  PT demo & verbal cues on sit to stand and stand to sit technique. Pt performed 10 reps from 18 chair without armrests using BUEs on seat.   Manual Therapy Cupping and soft tissue mobs to right anterior hip scar.    HOME EXERCISE PROGRAM: Access Code: [email or web address] URL: [email or web address] Date: 03/13/2024 Prepared by: Grayce Spatz  Exercises - Standing Hip Abduction: Alternating With Resistance Loop  - 1 x daily - 7 x weekly - 2 sets - 10 reps - 5 seconds hold -  Hip Extension with Resistance Loop  - 1 x daily - 7 x weekly - 2 sets - 10 reps - 5 seconds hold - Standing Hip Flexion with Resistance Loop  - 1 x daily - 7 x weekly - 2 sets - 10 reps - 5 seconds hold - Standing Marching  - 1 x daily - 7 x weekly - 2 sets - 10 reps - 5 seconds hold - Sit to  Stand with Armchair  - 1 x daily - 7 x weekly - 3 sets - 10 reps - Gastroc Stretch on Step  - 1-2 x daily - 7 x weekly - 1 sets - 3 reps - 30 seconds hold - Standing Hamstring Stretch with Step  - 1-2 x daily - 7 x weekly - 1 sets - 3 reps - 30 seconds hold - Seated Piriformis Stretch  - 1-2 x daily - 7 x weekly - 1 sets - 3 reps - 30 seconds hold - Tandem Walking with Counter Support  - 1 x daily - 7 x weekly - 3 sets - 10 reps - Carioca with Counter Support  - 1 x daily - 3 x weekly - 3 sets - 10 reps - [employer] on Counter  - 1 x daily - 7 x weekly - 12 sets - 10 reps  ASSESSMENT:  CLINICAL IMPRESSION: ***  Patient arrived to session noting continued pain with pressure in the Rt hip but feels an improvement overall. Patient tolerated all activities this date.  Patient will continue to benefit from skilled PT.   OBJECTIVE IMPAIRMENTS: Abnormal gait, decreased activity tolerance, decreased balance, decreased endurance, decreased knowledge of condition, decreased knowledge of use of DME, decreased mobility, difficulty walking, decreased ROM, decreased strength, increased edema, increased muscle spasms, impaired flexibility, postural dysfunction, obesity, and pain.   ACTIVITY LIMITATIONS: carrying, lifting, bending, sitting, standing, squatting, sleeping, stairs, transfers, bed mobility, and locomotion level  PARTICIPATION LIMITATIONS: meal prep, cleaning, laundry, shopping, and community activity  PERSONAL FACTORS: Age, Fitness, Past/current experiences, Time since onset of injury/illness/exacerbation, and 3+ comorbidities: see PMH are also affecting patient's functional outcome.   REHAB POTENTIAL: Good  CLINICAL DECISION MAKING: Stable/uncomplicated  EVALUATION COMPLEXITY: Low   GOALS: Goals reviewed with patient? Yes  SHORT TERM GOALS: (target date for Short term goals 03/28/2024)   1.  Patient will demonstrate independent use of home exercise program to maintain progress  from in clinic treatments. Goal status: Ongoing      04/02/2024  2.  Patient reports right hip pain <5/10 with activities including sleeping Goal status: Ongoing     04/02/2024  LONG TERM GOALS: (target dates for all long term goals  05/01/2024 )   1. Patient will demonstrate/report pain at worst less than or equal to 2/10 to facilitate minimal limitation in daily activity secondary to pain symptoms. Goal status: Ongoing   04/02/2024   2. Patient will demonstrate independent use of home exercise program to facilitate ability to maintain/progress functional gains from skilled physical therapy services. Goal status: Ongoing     04/02/2024   3. Patient will demonstrate Patient specific functional scale avg > or = 6 to indicate reduced disability due to condition.  Goal status: Ongoing    04/02/2024   4.  Patient will demonstrate right hip LE MMT 5/5 throughout to faciltiate usual transfers, stairs, squatting at [facility] for daily life.  Goal status: Ongoing    04/02/2024   5.  Patient ambulates >500' and negotiate ramps and curbs without an  assistive device independently. Goal status: Ongoing     04/02/2024   6.  Patient negotiates stairs with single rail alternating pattern modified independent. Goal status:   Ongoing   04/02/2024    PLAN:  PT FREQUENCY: 2x/week  PT DURATION: 10 weeks  PLANNED INTERVENTIONS: [id number]- PT Re-evaluation, 97750- Physical Performance Testing, 97110-Therapeutic exercises, 97530- Therapeutic activity, [id number]- Neuromuscular re-education, 97535- Self Care, [id number]- Manual therapy, [id number]- Gait training, [id number]- Electrical stimulation (manual), [id number]- Ultrasound, [id number]- Ionotophoresis 4mg /ml Dexamethasone , 79439 (1-2 muscles), [id number] (3+ muscles)- Dry Needling, Patient/Family education, Balance training, Stair training, Taping, Scar mobilization, DME instructions, Cryotherapy, and Moist heat  PLAN FOR NEXT SESSION:  ***  check right hip AROM.    LE strengthening, scar  mobs with cup, LE flexibility, balance     Grayce Spatz, PT, DPT 04/02/2024, 6:56 AM

## 2024-04-08 ENCOUNTER — Ambulatory Visit: Payer: Medicare (Managed Care) | Admitting: Physical Therapy

## 2024-04-08 ENCOUNTER — Encounter: Payer: Self-pay | Admitting: Physical Therapy

## 2024-04-08 DIAGNOSIS — R2681 Unsteadiness on feet: Secondary | ICD-10-CM

## 2024-04-08 DIAGNOSIS — M6281 Muscle weakness (generalized): Secondary | ICD-10-CM | POA: Diagnosis not present

## 2024-04-08 DIAGNOSIS — R2689 Other abnormalities of gait and mobility: Secondary | ICD-10-CM

## 2024-04-08 DIAGNOSIS — M25651 Stiffness of right hip, not elsewhere classified: Secondary | ICD-10-CM

## 2024-04-08 DIAGNOSIS — M25551 Pain in right hip: Secondary | ICD-10-CM | POA: Diagnosis not present

## 2024-04-08 NOTE — Therapy (Signed)
 OUTPATIENT PHYSICAL THERAPY LOWER EXTREMITY TREATMENT   Patient Name: Daniel Perkins MRN: [id number] DOB:[birth year], 68 y.o., male Today's Date: 04/08/2024  END OF SESSION:  PT End of Session - 04/08/24 0930     Visit Number 9    Number of Visits 20    Date for Recertification  05/01/24    Authorization Type Medicare Advantage    Progress Note Due on Visit 10    PT Start Time 0931    PT Stop Time 1014    PT Time Calculation (min) 43 min    Equipment Utilized During Treatment Gait belt    Activity Tolerance Patient tolerated treatment well;Patient limited by pain    Behavior During Therapy WFL for tasks assessed/performed            Past Medical History:  Diagnosis Date   Arthritis    Club foot of both lower extremities 1958   Coronary artery disease    Holter monitor 02/2012 - sinus with rare PVC   CTEV (congenital talipes equinovarus)    Gout    Hyperlipidemia    Hypertension    Myocardial infarction (HCC) ~ 2007   mild   Osteomyelitis (HCC)    Pneumonia 2000's   couple times   Pneumonia due to COVID-19 virus 04/28/2019   Type II diabetes mellitus (HCC) 2011   Past Surgical History:  Procedure Laterality Date   ANTERIOR CERVICAL DECOMP/DISCECTOMY FUSION  X 2   CARDIAC CATHETERIZATION  07/2009   CLOSED REDUCTION NASAL FRACTURE     when 68 years old   CLOSED REDUCTION NASAL FRACTURE Bilateral 09/21/2021   Procedure: CLOSED REDUCTION NASAL FRACTURE;  Surgeon: Carlie Clark, MD;  Location: [facility];  Service: ENT;  Laterality: Bilateral;   FOOT FRACTURE SURGERY Right 01/07/1989   pt a steel plate in   FOOT SURGERY Left x16   joints collapsed; keep getting infected   LAPAROSCOPY N/A 03/05/2021   Procedure: LAPAROSCOPY DIAGNOSTIC;  Surgeon: Signe Mitzie LABOR, MD;  Location: [facility] OR;  Service: General;  Laterality: N/A;   LAPAROTOMY Right 03/05/2021   Procedure: EXPLORATORY LAPAROTOMY, REPAIR SMALL BOWEL PERFORATION;  Surgeon: Signe Mitzie LABOR, MD;  Location: [facility] OR;  Service: General;  Laterality: Right;   LYSIS OF ADHESION  03/05/2021   Procedure: LYSIS OF ADHESION;  Surgeon: Signe Mitzie LABOR, MD;  Location: [facility] OR;  Service: General;;   POSTERIOR CERVICAL FUSION/FORAMINOTOMY  X 2   TOTAL HIP ARTHROPLASTY Right 01/01/2024   Procedure: ARTHROPLASTY, HIP, TOTAL, ANTERIOR APPROACH;  Surgeon: Jerri Kay HERO, MD;  Location: [facility] OR;  Service: Orthopedics;  Laterality: Right;  3-C   Patient Active Problem List   Diagnosis Date Noted   Status post total replacement of right hip 01/01/2024   Primary osteoarthritis of right hip 12/31/2023   Hx SBO 04/12/2021   Wound dehiscence, surgical 04/12/2021   Essential hypertension    Diabetes mellitus type 2 in nonobese (HCC)    Debility 03/25/2021   Malnutrition of moderate degree 03/11/2021   Left hand pain 06/22/2020   Back pain 05/20/2020   Neck pain 08/27/2019   CKD (chronic kidney disease), 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   Hepatic steatosis 05/01/2019   Prostate hypertrophy 12/06/2018   Sleep apnea-like behavior 10/01/2014   Peripheral vascular disease (HCC) 02/10/2014   Microcytic anemia 02/06/2013   CTEV (congenital talipes equinovarus)    Statin intolerance 06/29/2012   Esophageal reflux 06/29/2012   Health care maintenance 06/28/2012   Gout 06/12/2012   Obesity, Class  II, BMI 35-39.9 02/20/2012   DOE (dyspnea on exertion) 02/20/2012   Diabetes mellitus due to underlying condition, uncontrolled, with hyperglycemia (HCC) 09/06/2009   ERECTILE DYSFUNCTION, NON-ORGANIC 02/16/2009   Hyperlipemia 01/14/2009   HYPERTENSION 01/14/2009    PCP: Maree Leni Edyth DELENA, MD  REFERRING PROVIDER: Maree Leni Edyth DELENA, MD  REFERRING DIAG: [phone] (ICD-10-CM) - Status post total replacement of right hip   THERAPY DIAG:  Muscle weakness (generalized)  Stiffness of right hip, not elsewhere classified  Pain in right hip  Other abnormalities of gait and mobility  Unsteadiness on  feet  Rationale for Evaluation and Treatment: Rehabilitation  ONSET DATE: 01/01/2024  right THA sg  SUBJECTIVE:   SUBJECTIVE STATEMENT: His gout is flaring up with pain in right ankle started 2 days ago.    PERTINENT HISTORY: right THA 01/01/24, OA, club foot deformities BLEs, CAD, Congenital Talipes Equinovarus, gout, HLD, HTN, MI, DM2  Patient underwent a right Total Hip Arthroplasty anterior approach on 01/01/2024 due to OA. HHPT until 02/26/2024. At office visit on 02/20/2024, he was still having posterior lateral hip pain.   DIAGNOSTIC FINDINGS: post-op X-rays show no abnormalities s/p THA  PAIN:  NPRS scale:  in last week 0-1/10 laying with heating pad;  with activities up to 4-5/10  Pain location: right hip posterior lateral Pain description: achy Aggravating factors: sitting or laying on left side,  Relieving factors: muscle relaxer, heating pad, pain meds when really needs it  PRECAUTIONS: Anterior hip  WEIGHT BEARING RESTRICTIONS: No  FALLS:  Has patient fallen in last 6 months? No  LIVING ENVIRONMENT: Lives with: lives alone Lives in: [place] one story Stairs: Yes: External: 4 steps; can reach both Has following equipment at home: Single point cane, Environmental Consultant - 2 wheeled, Wheelchair (manual), Graybar electric, Grab bars, and Ramped entry  OCCUPATION:  retired on disability  PLOF: Independent  PATIENT GOALS:   to function with hip pain, walk well in community, clean house  Next MD visit:  03/26/2024  OBJECTIVE:   PATIENT SURVEYS:  Patient-Specific Activity Scoring Scheme  0 represents "unable to perform." 10 represents "able to perform at prior level. 0 1 2 3 4 5 6 7 8 9  10 (Date and Score)  Activity Eval  04/08/24   1. Walking  4   4  2. Sleeping  2   8  3.  Cleaning the house 2 2  4.    5.    Score 2.67 4.67   Total score = sum of the activity scores/number of activities Minimum detectable change (90%CI) for average score = 2 points Minimum  detectable change (90%CI) for single activity score = 3 points  COGNITION: Overall cognitive status: WFL    SENSATION: WFL but has numbness in thigh ant-lat (feels touch)   EDEMA:  Patient appears to have localized edema present around the right hip especially laterally.  MUSCLE LENGTH: Hamstrings: Not formally tested but appears to be tight bilaterally Debby test: Not formally tested but appears to be tight bilaterally  POSTURE:  rounded shoulders, forward head, decreased lumbar lordosis, flexed trunk , and weight shift left  PALPATION: Scar has slight adhesion with rope feeling;  tenderness to posterior lateral hip esp over Piriformis & Glut Med  LOWER EXTREMITY ROM:   ROM Right eval Right 04/08/24  Hip flexion Standing  A: 61* Standing A: 75*  Hip extension Standing  A: 1* Standing  A: 5*  Hip abduction Standing  A: 2* Prior to leaning Standing  A: 15*  Hip adduction    Hip internal rotation    Hip external rotation    Knee flexion    Knee extension    Ankle dorsiflexion    Ankle plantarflexion    Ankle inversion    Ankle eversion     (Blank rows = not tested)  LOWER EXTREMITY MMT:  MMT Right eval Left eval  Hip flexion 3-/5 5/5  Hip extension 3-/5 5/5  Hip abduction 3-/5 5/5  Hip adduction    Hip internal rotation    Hip external rotation    Knee flexion 4/5 5/5  Knee extension 4/5 5/5  Ankle dorsiflexion    Ankle plantarflexion    Ankle inversion    Ankle eversion     (Blank rows = not tested)   FUNCTIONAL TESTS:  18 inch chair transfer: able to arise without UE assist but uses back of legs against chair to stabilize 5 times Sit to/from stand:  22.19 sec  Timed up and go with cane 12.69 seconds and without an assistive device 11.97 seconds with supervision.  Gait without a device patient had significantly increased gait dynamics indicating high fall risk.  GAIT: Distance walked: > 100' with cane and 20' without an assistive  device Assistive device utilized: Single point cane and None Level of assistance: SBA Comments: Patient has significant antalgic gait with decreased stance duration RLE, abducted gait, step to gait pattern, early lacking trailing limb hip extension.                                                                                                                                                                       TODAY'S TREATMENT                                                                          DATE: 04/08/2024 Therapeutic Exercise:  Recumbent bike seat 7 level 4 for 6 minutes   Gastroc stretch step heel depression 30 sec 3 reps and hamstring stretch foot on step 30 sec 3 reps;  PT discussed relationship to muscle flexibility to pain & balance.    Therapeutic Activities: Step up, over and down 6 step with using RLE with BUEs on //bars 10 reps 2 sets.  PT demo & verbal cues on technique.  Side / lateral step with 6 box RLE with BUEs on //bars 10 reps.  PT demo & verbal cues on technique.  Stepping to 3 cones (ant-lat, lat & post-lat)  with ipsilateral UE support on counter 5 reps with each LE.   Functional squat 3 reps to pick up cones with focus on hip & knee flexion.  PT tactile cues.    Gait Training: PT demo & verbal cues on reaching heels forward with goal equal step length esp LLE step thru. Pt amb 40' X 8 without device carryover.   Pt amb 140' scanning environment right/left and up/down without device with supervision.   PT demo & verbal cues carrying cane to enable arm swing until his RLE fatigues or begins to be painful.  Pt able to return demo understanding.      TREATMENT                                                                          DATE: 03/20/2024 TherEx:  Recumbent bike le vel 4 for 8 minutes  PT educated and discussed on importance of bilateral and unilateral strengthening  Bilat leg press 125# for 2x12 with verbal cues for increased knee flexion;  appropriate carryover  Unilat leg press 2x15 at 68# performing on both sides  Standing calf raises into toe raises 2x10 with bilat UE use on // bars  PT discussed with adding hip hikes and step fwd/back with Lt LE for home exercises to strengthen Rt LE during stance in order to improve towards not requiring SPC   Neuro Re-Ed: Step over and back with weightbearing through Rt LE trying to decrease UE use throughout to challenge balance and stance phase 2x10 Standing square taps with bilat UE use to increase weightbearing and balance through Rt LE stance 2x6 taps ; attempting to decrease bilat UE use during second round  Standing hip hikes with mirror used for visual feedback 2x10 with both sides    TREATMENT                                                                          DATE: 03/18/2024 Therapeutic Exercise:  recumbent bike seat 7 level 4 for 6 min working on range Standing red theraband hip strengthening with unilateral UE support chair back with each LE 10 reps then without UE support (PT CGA) 5 reps with each LE:  flexion (straight leg), abduction, extension and adduction.   Therapeutic Activities:  Bilat leg press 2x10 with 125#  Unilat leg press 2x15 with 62# each side  Squat over chair with BUE support on rail 10 reps.  Partial squat with 12# kettle bell 5 reps 2 sets Functional squat to pick up 5 items from flor with supervision Slider with stance knee flexion on outward motion and ext on inward motion to 3 cones (ant-lat, lat, post-lat) with ipsilateral UE support on sink 5 reps ea.   TREATMENT  DATE: 03/13/2024 Therapeutic Exercise:  recumbent bike seat 7 level 4 for 6 min working on range Standing red theraband hip strengthening with BUE support (chair back & cane) with each LE 10 reps flexion (straight leg), abduction, extension and adduction.   Therapeutic Activities:  Bilat leg press 2x15 with  112#  Unilat leg press 2x15 with 56# each side  Squat over chair with BUE support on sink 10 reps.  PT demo & verbal cues on sit to stand and stand to sit technique. Pt performed 10 reps from 18 chair without armrests using BUEs on seat.   Manual Therapy Cupping and soft tissue mobs to right anterior hip scar.    HOME EXERCISE PROGRAM: Access Code: [email or web address] URL: [email or web address] Date: 03/13/2024 Prepared by: Grayce Spatz  Exercises - Standing Hip Abduction: Alternating With Resistance Loop  - 1 x daily - 7 x weekly - 2 sets - 10 reps - 5 seconds hold - Hip Extension with Resistance Loop  - 1 x daily - 7 x weekly - 2 sets - 10 reps - 5 seconds hold - Standing Hip Flexion with Resistance Loop  - 1 x daily - 7 x weekly - 2 sets - 10 reps - 5 seconds hold - Standing Marching  - 1 x daily - 7 x weekly - 2 sets - 10 reps - 5 seconds hold - Sit to Stand with Armchair  - 1 x daily - 7 x weekly - 3 sets - 10 reps - Gastroc Stretch on Step  - 1-2 x daily - 7 x weekly - 1 sets - 3 reps - 30 seconds hold - Standing Hamstring Stretch with Step  - 1-2 x daily - 7 x weekly - 1 sets - 3 reps - 30 seconds hold - Seated Piriformis Stretch  - 1-2 x daily - 7 x weekly - 1 sets - 3 reps - 30 seconds hold - Tandem Walking with Counter Support  - 1 x daily - 7 x weekly - 3 sets - 10 reps - [place]ari[place] with Counter Support  - 1 x daily - 3 x weekly - 3 sets - 10 reps - [employer] on Counter  - 1 x daily - 7 x weekly - 12 sets - 10 reps  ASSESSMENT:  CLINICAL IMPRESSION: Patient was able to improve gait with PT cues.  Patient improved his self rating of activities also.   Patient will continue to benefit from skilled PT.   OBJECTIVE IMPAIRMENTS: Abnormal gait, decreased activity tolerance, decreased balance, decreased endurance, decreased knowledge of condition, decreased knowledge of use of DME, decreased mobility, difficulty walking, decreased ROM, decreased strength, increased edema,  increased muscle spasms, impaired flexibility, postural dysfunction, obesity, and pain.   ACTIVITY LIMITATIONS: carrying, lifting, bending, sitting, standing, squatting, sleeping, stairs, transfers, bed mobility, and locomotion level  PARTICIPATION LIMITATIONS: meal prep, cleaning, laundry, shopping, and community activity  PERSONAL FACTORS: Age, Fitness, Past/current experiences, Time since onset of injury/illness/exacerbation, and 3+ comorbidities: see PMH are also affecting patient's functional outcome.   REHAB POTENTIAL: Good  CLINICAL DECISION MAKING: Stable/uncomplicated  EVALUATION COMPLEXITY: Low   GOALS: Goals reviewed with patient? Yes  SHORT TERM GOALS: (target date for Short term goals 03/28/2024)   1.  Patient will demonstrate independent use of home exercise program to maintain progress from in clinic treatments. Goal status:  MET    04/08/2024  2.  Patient reports right hip pain <5/10 with activities including sleeping Goal status:  partially MET  04/08/2024  LONG TERM GOALS: (target dates for all long term goals  05/01/2024 )   1. Patient will demonstrate/report pain at worst less than or equal to 2/10 to facilitate minimal limitation in daily activity secondary to pain symptoms. Goal status: Ongoing     04/08/2024   2. Patient will demonstrate independent use of home exercise program to facilitate ability to maintain/progress functional gains from skilled physical therapy services. Goal status: Ongoing     04/08/2024   3. Patient will demonstrate Patient specific functional scale avg > or = 6 to indicate reduced disability due to condition.  Goal status: Ongoing     04/08/2024   4.  Patient will demonstrate right hip LE MMT 5/5 throughout to faciltiate usual transfers, stairs, squatting at [facility] for daily life.  Goal status: Ongoing    04/08/2024   5.  Patient ambulates >500' and negotiate ramps and curbs without an assistive device independently. Goal status:  Ongoing      04/08/2024   6.  Patient negotiates stairs with single rail alternating pattern modified independent. Goal status:   Ongoing   04/08/2024    PLAN:  PT FREQUENCY: 2x/week  PT DURATION: 10 weeks  PLANNED INTERVENTIONS: [id number]- PT Re-evaluation, 97750- Physical Performance Testing, 97110-Therapeutic exercises, 97530- Therapeutic activity, [id number]- Neuromuscular re-education, 97535- Self Care, [id number]- Manual therapy, [phone]- Gait training, [phone]- Electrical stimulation (manual), [id number]- Ultrasound, [id number]- Ionotophoresis 4mg /ml Dexamethasone , [id number] (1-2 muscles), [id number] (3+ muscles)- Dry Needling, Patient/Family education, Balance training, Stair training, Taping, Scar mobilization, DME instructions, Cryotherapy, and Moist heat  PLAN FOR NEXT SESSION:  do 10th visit progress note,  LE strengthening standing functional activities & update HEP, LE flexibility, balance     Grayce Spatz, PT, DPT 04/08/2024, 10:25 AM

## 2024-04-15 ENCOUNTER — Encounter: Payer: Self-pay | Admitting: Physical Therapy

## 2024-04-15 ENCOUNTER — Ambulatory Visit: Payer: Medicare (Managed Care) | Admitting: Physical Therapy

## 2024-04-15 DIAGNOSIS — M6281 Muscle weakness (generalized): Secondary | ICD-10-CM

## 2024-04-15 DIAGNOSIS — M25651 Stiffness of right hip, not elsewhere classified: Secondary | ICD-10-CM

## 2024-04-15 DIAGNOSIS — M25551 Pain in right hip: Secondary | ICD-10-CM

## 2024-04-15 DIAGNOSIS — R2689 Other abnormalities of gait and mobility: Secondary | ICD-10-CM

## 2024-04-15 DIAGNOSIS — R2681 Unsteadiness on feet: Secondary | ICD-10-CM

## 2024-04-15 NOTE — Therapy (Signed)
 OUTPATIENT PHYSICAL THERAPY LOWER EXTREMITY TREATMENT & PROGRESS NOTE   Patient Name: Daniel Perkins MRN: [id number] DOB:July 31, 1955, 68 y.o., male Today's Date: 04/15/2024  Progress Note Reporting Period 02/27/2024 to 04/15/2024  See note below for Objective Data and Assessment of Progress/Goals.    END OF SESSION:  PT End of Session - 04/15/24 0929     Visit Number 10    Number of Visits 20    Date for Recertification  05/01/24    Authorization Type Medicare Advantage    Progress Note Due on Visit 10    PT Start Time 0929    PT Stop Time 1000    PT Time Calculation (min) 31 min    Equipment Utilized During Treatment Gait belt    Activity Tolerance Patient tolerated treatment well;Patient limited by pain    Behavior During Therapy WFL for tasks assessed/performed             Past Medical History:  Diagnosis Date   Arthritis    Club foot of both lower extremities 1958   Coronary artery disease    Holter monitor 02/2012 - sinus with rare PVC   CTEV (congenital talipes equinovarus)    Gout    Hyperlipidemia    Hypertension    Myocardial infarction (HCC) ~ 2007   mild   Osteomyelitis (HCC)    Pneumonia 2000's   couple times   Pneumonia due to COVID-19 virus 04/28/2019   Type II diabetes mellitus (HCC) 2011   Past Surgical History:  Procedure Laterality Date   ANTERIOR CERVICAL DECOMP/DISCECTOMY FUSION  X 2   CARDIAC CATHETERIZATION  07/2009   CLOSED REDUCTION NASAL FRACTURE     when 68 years old   CLOSED REDUCTION NASAL FRACTURE Bilateral 09/21/2021   Procedure: CLOSED REDUCTION NASAL FRACTURE;  Surgeon: Carlie Clark, MD;  Location: [facility];  Service: ENT;  Laterality: Bilateral;   FOOT FRACTURE SURGERY Right 01/07/1989   pt a steel plate in   FOOT SURGERY Left x16   joints collapsed; keep getting infected   LAPAROSCOPY N/A 03/05/2021   Procedure: LAPAROSCOPY DIAGNOSTIC;  Surgeon: Signe Mitzie LABOR, MD;  Location: [facility] OR;   Service: General;  Laterality: N/A;   LAPAROTOMY Right 03/05/2021   Procedure: EXPLORATORY LAPAROTOMY, REPAIR SMALL BOWEL PERFORATION;  Surgeon: Signe Mitzie LABOR, MD;  Location: [facility] OR;  Service: General;  Laterality: Right;   LYSIS OF ADHESION  03/05/2021   Procedure: LYSIS OF ADHESION;  Surgeon: Signe Mitzie LABOR, MD;  Location: [facility] OR;  Service: General;;   POSTERIOR CERVICAL FUSION/FORAMINOTOMY  X 2   TOTAL HIP ARTHROPLASTY Right 01/01/2024   Procedure: ARTHROPLASTY, HIP, TOTAL, ANTERIOR APPROACH;  Surgeon: Jerri Kay HERO, MD;  Location: [facility] OR;  Service: Orthopedics;  Laterality: Right;  3-C   Patient Active Problem List   Diagnosis Date Noted   Status post total replacement of right hip 01/01/2024   Primary osteoarthritis of right hip 12/31/2023   Hx SBO 04/12/2021   Wound dehiscence, surgical 04/12/2021   Essential hypertension    Diabetes mellitus type 2 in nonobese (HCC)    Debility 03/25/2021   Malnutrition of moderate degree 03/11/2021   Left hand pain 06/22/2020   Back pain 05/20/2020   Neck pain 08/27/2019   CKD (chronic kidney disease), 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   Hepatic steatosis 05/01/2019   Prostate hypertrophy 12/06/2018   Sleep apnea-like behavior 10/01/2014   Peripheral vascular disease (HCC) 02/10/2014   Microcytic anemia 02/06/2013   CTEV (congenital talipes equinovarus)  Statin intolerance 06/29/2012   Esophageal reflux 06/29/2012   Health care maintenance 06/28/2012   Gout 06/12/2012   Obesity, Class II, BMI 35-39.9 02/20/2012   DOE (dyspnea on exertion) 02/20/2012   Diabetes mellitus due to underlying condition, uncontrolled, with hyperglycemia (HCC) 09/06/2009   ERECTILE DYSFUNCTION, NON-ORGANIC 02/16/2009   Hyperlipemia 01/14/2009   HYPERTENSION 01/14/2009    PCP: Maree Leni Edyth DELENA, MD  REFERRING PROVIDER: Jule Ronal CROME, PA-C  REFERRING DIAG: [phone] (ICD-10-CM) - Status post total replacement of right hip   THERAPY DIAG:  Muscle weakness  (generalized)  Stiffness of right hip, not elsewhere classified  Pain in right hip  Other abnormalities of gait and mobility  Unsteadiness on feet  Rationale for Evaluation and Treatment: Rehabilitation  ONSET DATE: 01/01/2024  right THA sg  SUBJECTIVE:   SUBJECTIVE STATEMENT: His gout is under control.  His right butt cheek and quad area are hurting.    PERTINENT HISTORY: right THA 01/01/24, OA, club foot deformities BLEs, CAD, Congenital Talipes Equinovarus, gout, HLD, HTN, MI, DM2  Patient underwent a right Total Hip Arthroplasty anterior approach on 01/01/2024 due to OA. HHPT until 02/26/2024. At office visit on 02/20/2024, he was still having posterior lateral hip pain.   DIAGNOSTIC FINDINGS: post-op X-rays show no abnormalities s/p THA  PAIN:  NPRS scale:  in last week  0-1/10 laying with heating pad;  with activities up to 7/10  Pain location: right hip posterior lateral Pain description: achy Aggravating factors: sitting or laying on left side,  Relieving factors: muscle relaxer, heating pad, pain meds when really needs it  PRECAUTIONS: Anterior hip  WEIGHT BEARING RESTRICTIONS: No  FALLS:  Has patient fallen in last 6 months? No  LIVING ENVIRONMENT: Lives with: lives alone Lives in: [place] one story Stairs: Yes: External: 4 steps; can reach both Has following equipment at home: Single point cane, Environmental Consultant - 2 wheeled, Wheelchair (manual), Graybar electric, Grab bars, and Ramped entry  OCCUPATION:  retired on disability  PLOF: Independent  PATIENT GOALS:   to function with hip pain, walk well in community, clean house  Next MD visit:  03/26/2024  OBJECTIVE:   PATIENT SURVEYS:  Patient-Specific Activity Scoring Scheme  0 represents "unable to perform." 10 represents "able to perform at prior level. 0 1 2 3 4 5 6 7 8 9  10 (Date and Score)  Activity Eval  04/08/24   1. Walking  4   4  2. Sleeping  2   8  3.  Cleaning the house 2 2  4.    5.    Score  2.67 4.67   Total score = sum of the activity scores/number of activities Minimum detectable change (90%CI) for average score = 2 points Minimum detectable change (90%CI) for single activity score = 3 points  COGNITION: Overall cognitive status: WFL    SENSATION: WFL but has numbness in thigh ant-lat (feels touch)   EDEMA:  Patient appears to have localized edema present around the right hip especially laterally.  MUSCLE LENGTH: Hamstrings: Not formally tested but appears to be tight bilaterally Debby test: Not formally tested but appears to be tight bilaterally  POSTURE:  rounded shoulders, forward head, decreased lumbar lordosis, flexed trunk , and weight shift left  PALPATION: Scar has slight adhesion with rope feeling;  tenderness to posterior lateral hip esp over Piriformis & Glut Med  LOWER EXTREMITY ROM:   ROM Right eval Right 04/08/24  Hip flexion Standing  A: 61* Standing A: 75*  Hip extension Standing  A: 1* Standing  A: 5*  Hip abduction Standing  A: 2* Prior to leaning Standing  A: 15*  Hip adduction    Hip internal rotation    Hip external rotation    Knee flexion    Knee extension    Ankle dorsiflexion    Ankle plantarflexion    Ankle inversion    Ankle eversion     (Blank rows = not tested)  LOWER EXTREMITY MMT:  MMT Right eval Left eval  Hip flexion 3-/5 5/5  Hip extension 3-/5 5/5  Hip abduction 3-/5 5/5  Hip adduction    Hip internal rotation    Hip external rotation    Knee flexion 4/5 5/5  Knee extension 4/5 5/5  Ankle dorsiflexion    Ankle plantarflexion    Ankle inversion    Ankle eversion     (Blank rows = not tested)   FUNCTIONAL TESTS:  04/15/2024:  back movements with lateral weight shift right & left, back extension & flexion does not change right buttock pain.   Evaluation: 18 inch chair transfer: able to arise without UE assist but uses back of legs against chair to stabilize 5 times Sit to/from stand:  22.19 sec   Timed up and go with cane 12.69 seconds and without an assistive device 11.97 seconds with supervision.  Gait without a device patient had significantly increased gait dynamics indicating high fall risk.  GAIT: Distance walked: > 100' with cane and 20' without an assistive device Assistive device utilized: Single point cane and None Level of assistance: SBA Comments: Patient has significant antalgic gait with decreased stance duration RLE, abducted gait, step to gait pattern, early lacking trailing limb hip extension.                                                                                                                                                                       TODAY'S TREATMENT                                                                          DATE: 04/15/2024 Therapeutic Exercise:  Recumbent bike seat 7 level 4 for 6 minutes   BLE hip & knee flexion with BLEs on red swiss ball 10 reps 2 sets Bridge with BLEs on red swiss ball 10 reps 2 sets Trunk rotation to left to stretch lateral right hip 15 sec hold 3 reps Supine piriformis stretch 15 sec hold 3  reps  Therapeutic Activities: See objective data  Self-care: PT demo & verbal cue on using towel roll in sitting to limit positioning in ER.  Pt verbalized understanding.   PT reviewed using tennis balls (2) in stockinet in sitting and standing against wall to massage area. Pt verbalized understanding.      TREATMENT                                                                          DATE: 04/08/2024 Therapeutic Exercise:  Recumbent bike seat 7 level 4 for 6 minutes   Gastroc stretch step heel depression 30 sec 3 reps and hamstring stretch foot on step 30 sec 3 reps;  PT discussed relationship to muscle flexibility to pain & balance.    Therapeutic Activities: Step up, over and down 6 step with using RLE with BUEs on //bars 10 reps 2 sets.  PT demo & verbal cues on technique.  Side / lateral step with 6  box RLE with BUEs on //bars 10 reps.  PT demo & verbal cues on technique.  Stepping to 3 cones (ant-lat, lat & post-lat) with ipsilateral UE support on counter 5 reps with each LE.   Functional squat 3 reps to pick up cones with focus on hip & knee flexion.  PT tactile cues.    Gait Training: PT demo & verbal cues on reaching heels forward with goal equal step length esp LLE step thru. Pt amb 40' X 8 without device carryover.   Pt amb 140' scanning environment right/left and up/down without device with supervision.   PT demo & verbal cues carrying cane to enable arm swing until his RLE fatigues or begins to be painful.  Pt able to return demo understanding.      TREATMENT                                                                          DATE: 03/20/2024 TherEx:  Recumbent bike le vel 4 for 8 minutes  PT educated and discussed on importance of bilateral and unilateral strengthening  Bilat leg press 125# for 2x12 with verbal cues for increased knee flexion; appropriate carryover  Unilat leg press 2x15 at 68# performing on both sides  Standing calf raises into toe raises 2x10 with bilat UE use on // bars  PT discussed with adding hip hikes and step fwd/back with Lt LE for home exercises to strengthen Rt LE during stance in order to improve towards not requiring SPC   Neuro Re-Ed: Step over and back with weightbearing through Rt LE trying to decrease UE use throughout to challenge balance and stance phase 2x10 Standing square taps with bilat UE use to increase weightbearing and balance through Rt LE stance 2x6 taps ; attempting to decrease bilat UE use during second round  Standing hip hikes with mirror used for visual feedback 2x10 with both sides    TREATMENT  DATE: 03/18/2024 Therapeutic Exercise:  recumbent bike seat 7 level 4 for 6 min working on range Standing red theraband hip strengthening with unilateral  UE support chair back with each LE 10 reps then without UE support (PT CGA) 5 reps with each LE:  flexion (straight leg), abduction, extension and adduction.   Therapeutic Activities:  Bilat leg press 2x10 with 125#  Unilat leg press 2x15 with 62# each side  Squat over chair with BUE support on rail 10 reps.  Partial squat with 12# kettle bell 5 reps 2 sets Functional squat to pick up 5 items from flor with supervision Slider with stance knee flexion on outward motion and ext on inward motion to 3 cones (ant-lat, lat, post-lat) with ipsilateral UE support on sink 5 reps ea.   HOME EXERCISE PROGRAM: Access Code: [email or web address] URL: [email or web address] Date: 03/13/2024 Prepared by: Grayce Spatz  Exercises - Standing Hip Abduction: Alternating With Resistance Loop  - 1 x daily - 7 x weekly - 2 sets - 10 reps - 5 seconds hold - Hip Extension with Resistance Loop  - 1 x daily - 7 x weekly - 2 sets - 10 reps - 5 seconds hold - Standing Hip Flexion with Resistance Loop  - 1 x daily - 7 x weekly - 2 sets - 10 reps - 5 seconds hold - Standing Marching  - 1 x daily - 7 x weekly - 2 sets - 10 reps - 5 seconds hold - Sit to Stand with Armchair  - 1 x daily - 7 x weekly - 3 sets - 10 reps - Gastroc Stretch on Step  - 1-2 x daily - 7 x weekly - 1 sets - 3 reps - 30 seconds hold - Standing Hamstring Stretch with Step  - 1-2 x daily - 7 x weekly - 1 sets - 3 reps - 30 seconds hold - Seated Piriformis Stretch  - 1-2 x daily - 7 x weekly - 1 sets - 3 reps - 30 seconds hold - Tandem Walking with Counter Support  - 1 x daily - 7 x weekly - 3 sets - 10 reps - [place]ari[place] with Counter Support  - 1 x daily - 3 x weekly - 3 sets - 10 reps - [employer] on Counter  - 1 x daily - 7 x weekly - 12 sets - 10 reps  ASSESSMENT:  CLINICAL IMPRESSION: Patient has made significant progress over 10 PT visits.  However he continues to report significant pain in right hip muscles.  He appears to understand  his HEP but uncertain of compliance.  Patient will continue to benefit from skilled PT.   OBJECTIVE IMPAIRMENTS: Abnormal gait, decreased activity tolerance, decreased balance, decreased endurance, decreased knowledge of condition, decreased knowledge of use of DME, decreased mobility, difficulty walking, decreased ROM, decreased strength, increased edema, increased muscle spasms, impaired flexibility, postural dysfunction, obesity, and pain.   ACTIVITY LIMITATIONS: carrying, lifting, bending, sitting, standing, squatting, sleeping, stairs, transfers, bed mobility, and locomotion level  PARTICIPATION LIMITATIONS: meal prep, cleaning, laundry, shopping, and community activity  PERSONAL FACTORS: Age, Fitness, Past/current experiences, Time since onset of injury/illness/exacerbation, and 3+ comorbidities: see PMH are also affecting patient's functional outcome.   REHAB POTENTIAL: Good  CLINICAL DECISION MAKING: Stable/uncomplicated  EVALUATION COMPLEXITY: Low   GOALS: Goals reviewed with patient? Yes  SHORT TERM GOALS: (target date for Short term goals 03/28/2024)   1.  Patient will demonstrate independent use of home exercise program to maintain progress from in clinic  treatments. Goal status:  MET    04/08/2024  2.  Patient reports right hip pain <5/10 with activities including sleeping Goal status:  partially MET     04/08/2024  LONG TERM GOALS: (target dates for all long term goals  05/01/2024 )   1. Patient will demonstrate/report pain at worst less than or equal to 2/10 to facilitate minimal limitation in daily activity secondary to pain symptoms. Goal status: Ongoing     04/15/2024   2. Patient will demonstrate independent use of home exercise program to facilitate ability to maintain/progress functional gains from skilled physical therapy services. Goal status: Ongoing     04/15/2024   3. Patient will demonstrate Patient specific functional scale avg > or = 6 to indicate reduced  disability due to condition.  Goal status: Ongoing     04/15/2024   4.  Patient will demonstrate right hip LE MMT 5/5 throughout to faciltiate usual transfers, stairs, squatting at [facility] for daily life.  Goal status: Ongoing    04/15/2024   5.  Patient ambulates >500' and negotiate ramps and curbs without an assistive device independently. Goal status: Ongoing      04/15/2024   6.  Patient negotiates stairs with single rail alternating pattern modified independent. Goal status:   Ongoing   04/15/2024    PLAN:  PT FREQUENCY: 2x/week  PT DURATION: 10 weeks  PLANNED INTERVENTIONS: 97164- PT Re-evaluation, 97750- Physical Performance Testing, 97110-Therapeutic exercises, 97530- Therapeutic activity, [id number]- Neuromuscular re-education, 97535- Self Care, [id number]- Manual therapy, [phone]- Gait training, [phone]- Electrical stimulation (manual), [id number]- Ultrasound, [id number]- Ionotophoresis 4mg /ml Dexamethasone , 79439 (1-2 muscles), [id number] (3+ muscles)- Dry Needling, Patient/Family education, Balance training, Stair training, Taping, Scar mobilization, DME instructions, Cryotherapy, and Moist heat  PLAN FOR NEXT SESSION:   update HEP,  LE strengthening standing functional activities, LE flexibility, balance     Grayce Spatz, PT, DPT 04/15/2024, 10:08 AM

## 2024-04-19 ENCOUNTER — Encounter: Payer: Self-pay | Admitting: Cardiology

## 2024-04-22 ENCOUNTER — Encounter: Payer: Self-pay | Admitting: Physical Therapy

## 2024-04-22 ENCOUNTER — Ambulatory Visit: Payer: Medicare (Managed Care) | Admitting: Physical Therapy

## 2024-04-22 DIAGNOSIS — M6281 Muscle weakness (generalized): Secondary | ICD-10-CM

## 2024-04-22 DIAGNOSIS — M25551 Pain in right hip: Secondary | ICD-10-CM

## 2024-04-22 DIAGNOSIS — R2681 Unsteadiness on feet: Secondary | ICD-10-CM

## 2024-04-22 DIAGNOSIS — R2689 Other abnormalities of gait and mobility: Secondary | ICD-10-CM

## 2024-04-22 DIAGNOSIS — M25651 Stiffness of right hip, not elsewhere classified: Secondary | ICD-10-CM

## 2024-04-22 NOTE — Therapy (Signed)
 OUTPATIENT PHYSICAL THERAPY LOWER EXTREMITY TREATMENT   Patient Name: Daniel Perkins MRN: [id number] DOB:[birth year], 68 y.o., male Today's Date: 04/22/2024  END OF SESSION:  PT End of Session - 04/22/24 0851     Visit Number 11    Number of Visits 20    Date for Recertification  05/01/24    Authorization Type Medicare Advantage    Progress Note Due on Visit 10    PT Start Time 0845    PT Stop Time 0929    PT Time Calculation (min) 44 min    Equipment Utilized During Treatment Gait belt    Activity Tolerance Patient tolerated treatment well;Patient limited by pain    Behavior During Therapy WFL for tasks assessed/performed              Past Medical History:  Diagnosis Date   Arthritis    Club foot of both lower extremities 1958   Coronary artery disease    Holter monitor 02/2012 - sinus with rare PVC   CTEV (congenital talipes equinovarus)    Gout    Hyperlipidemia    Hypertension    Myocardial infarction (HCC) ~ 2007   mild   Osteomyelitis (HCC)    Pneumonia 2000's   couple times   Pneumonia due to COVID-19 virus 04/28/2019   Type II diabetes mellitus (HCC) 2011   Past Surgical History:  Procedure Laterality Date   ANTERIOR CERVICAL DECOMP/DISCECTOMY FUSION  X 2   CARDIAC CATHETERIZATION  07/2009   CLOSED REDUCTION NASAL FRACTURE     when 68 years old   CLOSED REDUCTION NASAL FRACTURE Bilateral 09/21/2021   Procedure: CLOSED REDUCTION NASAL FRACTURE;  Surgeon: Carlie Clark, MD;  Location: [facility];  Service: ENT;  Laterality: Bilateral;   FOOT FRACTURE SURGERY Right 01/07/1989   pt a steel plate in   FOOT SURGERY Left x16   joints collapsed; keep getting infected   LAPAROSCOPY N/A 03/05/2021   Procedure: LAPAROSCOPY DIAGNOSTIC;  Surgeon: Signe Mitzie LABOR, MD;  Location: [facility] OR;  Service: General;  Laterality: N/A;   LAPAROTOMY Right 03/05/2021   Procedure: EXPLORATORY LAPAROTOMY, REPAIR SMALL BOWEL PERFORATION;  Surgeon:  Signe Mitzie LABOR, MD;  Location: [facility] OR;  Service: General;  Laterality: Right;   LYSIS OF ADHESION  03/05/2021   Procedure: LYSIS OF ADHESION;  Surgeon: Signe Mitzie LABOR, MD;  Location: [facility] OR;  Service: General;;   POSTERIOR CERVICAL FUSION/FORAMINOTOMY  X 2   TOTAL HIP ARTHROPLASTY Right 01/01/2024   Procedure: ARTHROPLASTY, HIP, TOTAL, ANTERIOR APPROACH;  Surgeon: Jerri Kay HERO, MD;  Location: [facility] OR;  Service: Orthopedics;  Laterality: Right;  3-C   Patient Active Problem List   Diagnosis Date Noted   Status post total replacement of right hip 01/01/2024   Primary osteoarthritis of right hip 12/31/2023   Hx SBO 04/12/2021   Wound dehiscence, surgical 04/12/2021   Essential hypertension    Diabetes mellitus type 2 in nonobese (HCC)    Debility 03/25/2021   Malnutrition of moderate degree 03/11/2021   Left hand pain 06/22/2020   Back pain 05/20/2020   Neck pain 08/27/2019   CKD (chronic kidney disease), 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   Hepatic steatosis 05/01/2019   Prostate hypertrophy 12/06/2018   Sleep apnea-like behavior 10/01/2014   Peripheral vascular disease (HCC) 02/10/2014   Microcytic anemia 02/06/2013   CTEV (congenital talipes equinovarus)    Statin intolerance 06/29/2012   Esophageal reflux 06/29/2012   Health care maintenance 06/28/2012   Gout 06/12/2012  Obesity, Class II, BMI 35-39.9 02/20/2012   DOE (dyspnea on exertion) 02/20/2012   Diabetes mellitus due to underlying condition, uncontrolled, with hyperglycemia (HCC) 09/06/2009   ERECTILE DYSFUNCTION, NON-ORGANIC 02/16/2009   Hyperlipemia 01/14/2009   HYPERTENSION 01/14/2009    PCP: Maree Leni Edyth DELENA, MD  REFERRING PROVIDER: Jule Ronal CROME, PA-C  REFERRING DIAG: [phone] (ICD-10-CM) - Status post total replacement of right hip   THERAPY DIAG:  Muscle weakness (generalized)  Stiffness of right hip, not elsewhere classified  Pain in right hip  Other abnormalities of gait and mobility  Unsteadiness on  feet  Rationale for Evaluation and Treatment: Rehabilitation  ONSET DATE: 01/01/2024  right THA sg  SUBJECTIVE:   SUBJECTIVE STATEMENT: His gout flared up again this time in right hand (dominant).  He has been doing his exercises.  He is using tennis ball to massage his hip.    PERTINENT HISTORY: right THA 01/01/24, OA, club foot deformities BLEs, CAD, Congenital Talipes Equinovarus, gout, HLD, HTN, MI, DM2  Patient underwent a right Total Hip Arthroplasty anterior approach on 01/01/2024 due to OA. HHPT until 02/26/2024. At office visit on 02/20/2024, he was still having posterior lateral hip pain.   DIAGNOSTIC FINDINGS: post-op X-rays show no abnormalities s/p THA  PAIN:  NPRS scale:  in last week 0-1/10 laying with heating pad;  with activities up to mostly 5/10 (7/10 1-2 days) Pain location: right hip posterior lateral Pain description: achy Aggravating factors: sitting or laying on left side,  Relieving factors: muscle relaxer, heating pad, pain meds when really needs it  PRECAUTIONS: Anterior hip  WEIGHT BEARING RESTRICTIONS: No  FALLS:  Has patient fallen in last 6 months? No  LIVING ENVIRONMENT: Lives with: lives alone Lives in: [address] one story Stairs: Yes: External: 4 steps; can reach both Has following equipment at home: Single point cane, Environmental Consultant - 2 wheeled, Wheelchair (manual), [employer] electric, Grab bars, and Ramped entry  OCCUPATION:  retired on disability  PLOF: Independent  PATIENT GOALS:   to function with hip pain, walk well in community, clean house  Next MD visit:  03/26/2024  OBJECTIVE:   PATIENT SURVEYS:  Patient-Specific Activity Scoring Scheme  0 represents unable to perform. 10 represents able to perform at prior level. 0 1 2 3 4 5 6 7 8 9  10 (Date and Score)  Activity Eval  04/08/24   1. Walking  4   4  2. Sleeping  2   8  3.  Cleaning the house 2 2  4.    5.    Score 2.67 4.67   Total score = sum of the activity scores/number of  activities Minimum detectable change (90%CI) for average score = 2 points Minimum detectable change (90%CI) for single activity score = 3 points  COGNITION: Overall cognitive status: WFL    SENSATION: WFL but has numbness in thigh ant-lat (feels touch)   EDEMA:  Patient appears to have localized edema present around the right hip especially laterally.  MUSCLE LENGTH: Hamstrings: Not formally tested but appears to be tight bilaterally Debby test: Not formally tested but appears to be tight bilaterally  POSTURE:  rounded shoulders, forward head, decreased lumbar lordosis, flexed trunk , and weight shift left  PALPATION: Scar has slight adhesion with rope feeling;  tenderness to posterior lateral hip esp over Piriformis & Glut Med  LOWER EXTREMITY ROM:   ROM Right eval Right 04/08/24  Hip flexion Standing  A: 61* Standing A: 75*  Hip extension Standing  A: 1* Standing  A: 5*  Hip abduction Standing  A: 2* Prior to leaning Standing  A: 15*  Hip adduction    Hip internal rotation    Hip external rotation    Knee flexion    Knee extension    Ankle dorsiflexion    Ankle plantarflexion    Ankle inversion    Ankle eversion     (Blank rows = not tested)  LOWER EXTREMITY MMT:  MMT Right eval Left eval  Hip flexion 3-/5 5/5  Hip extension 3-/5 5/5  Hip abduction 3-/5 5/5  Hip adduction    Hip internal rotation    Hip external rotation    Knee flexion 4/5 5/5  Knee extension 4/5 5/5  Ankle dorsiflexion    Ankle plantarflexion    Ankle inversion    Ankle eversion     (Blank rows = not tested)   FUNCTIONAL TESTS:  04/15/2024:  back movements with lateral weight shift right & left, back extension & flexion does not change right buttock pain.   Evaluation: 18 inch chair transfer: able to arise without UE assist but uses back of legs against chair to stabilize 5 times Sit to/from stand:  22.19 sec  Timed up and go with cane 12.69 seconds and without an  assistive device 11.97 seconds with supervision.  Gait without a device patient had significantly increased gait dynamics indicating high fall risk.  GAIT: Distance walked: > 100' with cane and 20' without an assistive device Assistive device utilized: Single point cane and None Level of assistance: SBA Comments: Patient has significant antalgic gait with decreased stance duration RLE, abducted gait, step to gait pattern, early lacking trailing limb hip extension.                                                                                                                                                                       TODAY'S TREATMENT                                                                          DATE: 04/22/2024 Therapeutic Exercise:  Recumbent bike seat 7 level 4 for 6 minutes   Hip flexor stretch standing with foot on 18 chair against wall 15 sec hold 2 reps 3 sets (moving stance foot 1 closer to chair)  with BLEs Knee to chest stretch 15 sec hold with ea LE then extending contralateral LE for gentle ext stretch 3 reps ea LE RLE  Seated hip rotation internal & external 10 reps 2 sets ea motion.    Self-care: PT demo & verbal cue on donning socks & shoes using foot stool. Pt verbalized understanding.  Pt return demo first with LLE then RLE.      TREATMENT                                                                          DATE: 04/15/2024 Therapeutic Exercise:  Recumbent bike seat 7 level 4 for 6 minutes   BLE hip & knee flexion with BLEs on red swiss ball 10 reps 2 sets Bridge with BLEs on red swiss ball 10 reps 2 sets Trunk rotation to left to stretch lateral right hip 15 sec hold 3 reps Supine piriformis stretch 15 sec hold 3 reps  Therapeutic Activities: See objective data  Self-care: PT demo & verbal cue on using towel roll in sitting to limit positioning in ER.  Pt verbalized understanding.   PT reviewed using tennis balls (2) in stockinet in sitting  and standing against wall to massage area. Pt verbalized understanding.      TREATMENT                                                                          DATE: 04/08/2024 Therapeutic Exercise:  Recumbent bike seat 7 level 4 for 6 minutes   Gastroc stretch step heel depression 30 sec 3 reps and hamstring stretch foot on step 30 sec 3 reps;  PT discussed relationship to muscle flexibility to pain & balance.    Therapeutic Activities: Step up, over and down 6 step with using RLE with BUEs on //bars 10 reps 2 sets.  PT demo & verbal cues on technique.  Side / lateral step with 6 box RLE with BUEs on //bars 10 reps.  PT demo & verbal cues on technique.  Stepping to 3 cones (ant-lat, lat & post-lat) with ipsilateral UE support on counter 5 reps with each LE.   Functional squat 3 reps to pick up cones with focus on hip & knee flexion.  PT tactile cues.    Gait Training: PT demo & verbal cues on reaching heels forward with goal equal step length esp LLE step thru. Pt amb 40' X 8 without device carryover.   Pt amb 140' scanning environment right/left and up/down without device with supervision.   PT demo & verbal cues carrying cane to enable arm swing until his RLE fatigues or begins to be painful.  Pt able to return demo understanding.      TREATMENT  DATE: 03/20/2024 TherEx:  Recumbent bike le vel 4 for 8 minutes  PT educated and discussed on importance of bilateral and unilateral strengthening  Bilat leg press 125# for 2x12 with verbal cues for increased knee flexion; appropriate carryover  Unilat leg press 2x15 at 68# performing on both sides  Standing calf raises into toe raises 2x10 with bilat UE use on // bars  PT discussed with adding hip hikes and step fwd/back with Lt LE for home exercises to strengthen Rt LE during stance in order to improve towards not requiring SPC   Neuro Re-Ed: Step over and back with  weightbearing through Rt LE trying to decrease UE use throughout to challenge balance and stance phase 2x10 Standing square taps with bilat UE use to increase weightbearing and balance through Rt LE stance 2x6 taps ; attempting to decrease bilat UE use during second round  Standing hip hikes with mirror used for visual feedback 2x10 with both sides     HOME EXERCISE PROGRAM: Access Code: [email or web address] URL: [email or web address] Date: 03/13/2024 Prepared by: Grayce Spatz  Exercises - Standing Hip Abduction: Alternating With Resistance Loop  - 1 x daily - 7 x weekly - 2 sets - 10 reps - 5 seconds hold - Hip Extension with Resistance Loop  - 1 x daily - 7 x weekly - 2 sets - 10 reps - 5 seconds hold - Standing Hip Flexion with Resistance Loop  - 1 x daily - 7 x weekly - 2 sets - 10 reps - 5 seconds hold - Standing Marching  - 1 x daily - 7 x weekly - 2 sets - 10 reps - 5 seconds hold - Sit to Stand with Armchair  - 1 x daily - 7 x weekly - 3 sets - 10 reps - Gastroc Stretch on Step  - 1-2 x daily - 7 x weekly - 1 sets - 3 reps - 30 seconds hold - Standing Hamstring Stretch with Step  - 1-2 x daily - 7 x weekly - 1 sets - 3 reps - 30 seconds hold - Seated Piriformis Stretch  - 1-2 x daily - 7 x weekly - 1 sets - 3 reps - 30 seconds hold - Tandem Walking with Counter Support  - 1 x daily - 7 x weekly - 3 sets - 10 reps - [place] with Counter Support  - 1 x daily - 3 x weekly - 3 sets - 10 reps - [employer] on Counter  - 1 x daily - 7 x weekly - 12 sets - 10 reps  ASSESSMENT:  CLINICAL IMPRESSION: Patient continues to have hip pain limiting his movements & activities.  He has had Gout flare ups 2x in last weeks which also may be effecting his right hip.  PT instructed in modified technique to don socks & shoes which appears to understand.  Patient will continue to benefit from skilled PT.   OBJECTIVE IMPAIRMENTS: Abnormal gait, decreased activity tolerance, decreased balance,  decreased endurance, decreased knowledge of condition, decreased knowledge of use of DME, decreased mobility, difficulty walking, decreased ROM, decreased strength, increased edema, increased muscle spasms, impaired flexibility, postural dysfunction, obesity, and pain.   ACTIVITY LIMITATIONS: carrying, lifting, bending, sitting, standing, squatting, sleeping, stairs, transfers, bed mobility, and locomotion level  PARTICIPATION LIMITATIONS: meal prep, cleaning, laundry, shopping, and community activity  PERSONAL FACTORS: Age, Fitness, Past/current experiences, Time since onset of injury/illness/exacerbation, and 3+ comorbidities: see PMH are also affecting patient's functional outcome.   REHAB POTENTIAL: Good  CLINICAL DECISION MAKING: Stable/uncomplicated  EVALUATION COMPLEXITY: Low   GOALS: Goals reviewed with patient? Yes  SHORT TERM GOALS: (target date for Short term goals 03/28/2024)   1.  Patient will demonstrate independent use of home exercise program to maintain progress from in clinic treatments. Goal status:  MET    04/08/2024  2.  Patient reports right hip pain <5/10 with activities including sleeping Goal status:  partially MET     04/08/2024  LONG TERM GOALS: (target dates for all long term goals  05/01/2024 )   1. Patient will demonstrate/report pain at worst less than or equal to 2/10 to facilitate minimal limitation in daily activity secondary to pain symptoms. Goal status: Ongoing       04/22/2024   2. Patient will demonstrate independent use of home exercise program to facilitate ability to maintain/progress functional gains from skilled physical therapy services. Goal status: Ongoing        04/22/2024   3. Patient will demonstrate Patient specific functional scale avg > or = 6 to indicate reduced disability due to condition.  Goal status: Ongoing      04/22/2024   4.  Patient will demonstrate right hip LE MMT 5/5 throughout to faciltiate usual transfers, stairs,  squatting at [facility] for daily life.  Goal status: Ongoing    04/22/2024   5.  Patient ambulates >500' and negotiate ramps and curbs without an assistive device independently. Goal status: Ongoing      04/22/2024   6.  Patient negotiates stairs with single rail alternating pattern modified independent. Goal status:   Ongoing   04/22/2024    PLAN:  PT FREQUENCY: 2x/week  PT DURATION: 10 weeks  PLANNED INTERVENTIONS: 97164- PT Re-evaluation, 97750- Physical Performance Testing, 97110-Therapeutic exercises, 97530- Therapeutic activity, [id number]- Neuromuscular re-education, 97535- Self Care, [id number]- Manual therapy, [id number]- Gait training, [phone]- Electrical stimulation (manual), [id number]- Ultrasound, [id number]- Ionotophoresis 4mg /ml Dexamethasone , 79439 (1-2 muscles), 20561 (3+ muscles)- Dry Needling, Patient/Family education, Balance training, Stair training, Taping, Scar mobilization, DME instructions, Cryotherapy, and Moist heat  PLAN FOR NEXT SESSION:  assess LTGs with discussion discharge vs re-certification.      Grayce Spatz, PT, DPT 04/22/2024, 9:36 AM

## 2024-04-29 ENCOUNTER — Ambulatory Visit: Payer: Medicare (Managed Care)

## 2024-04-29 DIAGNOSIS — M25651 Stiffness of right hip, not elsewhere classified: Secondary | ICD-10-CM

## 2024-04-29 DIAGNOSIS — R2689 Other abnormalities of gait and mobility: Secondary | ICD-10-CM | POA: Diagnosis not present

## 2024-04-29 DIAGNOSIS — M25551 Pain in right hip: Secondary | ICD-10-CM

## 2024-04-29 DIAGNOSIS — R2681 Unsteadiness on feet: Secondary | ICD-10-CM | POA: Diagnosis not present

## 2024-04-29 DIAGNOSIS — M6281 Muscle weakness (generalized): Secondary | ICD-10-CM | POA: Diagnosis not present

## 2024-04-29 NOTE — Therapy (Signed)
 " OUTPATIENT PHYSICAL THERAPY LOWER EXTREMITY TREATMENT / RECERTIFICATION   Patient Name: Daniel Perkins MRN: [id number] DOB:[birth year], 68 y.o., male Today's Date: 04/29/2024  END OF SESSION:  PT End of Session - 04/29/24 0853     Visit Number 12    Number of Visits 20    Date for Recertification  06/24/24    Authorization Type Medicare Advantage    Progress Note Due on Visit 10    PT Start Time 0853    PT Stop Time 0931    PT Time Calculation (min) 38 min    Equipment Utilized During Treatment Gait belt    Activity Tolerance Patient tolerated treatment well;Patient limited by pain    Behavior During Therapy WFL for tasks assessed/performed            Past Medical History:  Diagnosis Date   Arthritis    Club foot of both lower extremities 1958   Coronary artery disease    Holter monitor 02/2012 - sinus with rare PVC   CTEV (congenital talipes equinovarus)    Gout    Hyperlipidemia    Hypertension    Myocardial infarction (HCC) ~ 2007   mild   Osteomyelitis (HCC)    Pneumonia 2000's   couple times   Pneumonia due to COVID-19 virus 04/28/2019   Type II diabetes mellitus (HCC) 2011   Past Surgical History:  Procedure Laterality Date   ANTERIOR CERVICAL DECOMP/DISCECTOMY FUSION  X 2   CARDIAC CATHETERIZATION  07/2009   CLOSED REDUCTION NASAL FRACTURE     when 68 years old   CLOSED REDUCTION NASAL FRACTURE Bilateral 09/21/2021   Procedure: CLOSED REDUCTION NASAL FRACTURE;  Surgeon: Carlie Clark, MD;  Location: [facility];  Service: ENT;  Laterality: Bilateral;   FOOT FRACTURE SURGERY Right 01/07/1989   pt a steel plate in   FOOT SURGERY Left x16   joints collapsed; keep getting infected   LAPAROSCOPY N/A 03/05/2021   Procedure: LAPAROSCOPY DIAGNOSTIC;  Surgeon: Signe Mitzie LABOR, MD;  Location: [facility] OR;  Service: General;  Laterality: N/A;   LAPAROTOMY Right 03/05/2021   Procedure: EXPLORATORY LAPAROTOMY, REPAIR SMALL BOWEL PERFORATION;   Surgeon: Signe Mitzie LABOR, MD;  Location: [facility] OR;  Service: General;  Laterality: Right;   LYSIS OF ADHESION  03/05/2021   Procedure: LYSIS OF ADHESION;  Surgeon: Signe Mitzie LABOR, MD;  Location: [facility] OR;  Service: General;;   POSTERIOR CERVICAL FUSION/FORAMINOTOMY  X 2   TOTAL HIP ARTHROPLASTY Right 01/01/2024   Procedure: ARTHROPLASTY, HIP, TOTAL, ANTERIOR APPROACH;  Surgeon: Jerri Kay HERO, MD;  Location: [facility] OR;  Service: Orthopedics;  Laterality: Right;  3-C   Patient Active Problem List   Diagnosis Date Noted   Status post total replacement of right hip 01/01/2024   Primary osteoarthritis of right hip 12/31/2023   Hx SBO 04/12/2021   Wound dehiscence, surgical 04/12/2021   Essential hypertension    Diabetes mellitus type 2 in nonobese (HCC)    Debility 03/25/2021   Malnutrition of moderate degree 03/11/2021   Left hand pain 06/22/2020   Back pain 05/20/2020   Neck pain 08/27/2019   CKD (chronic kidney disease), 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   Hepatic steatosis 05/01/2019   Prostate hypertrophy 12/06/2018   Sleep apnea-like behavior 10/01/2014   Peripheral vascular disease (HCC) 02/10/2014   Microcytic anemia 02/06/2013   CTEV (congenital talipes equinovarus)    Statin intolerance 06/29/2012   Esophageal reflux 06/29/2012   Health care maintenance 06/28/2012   Gout 06/12/2012  Obesity, Class II, BMI 35-39.9 02/20/2012   DOE (dyspnea on exertion) 02/20/2012   Diabetes mellitus due to underlying condition, uncontrolled, with hyperglycemia (HCC) 09/06/2009   ERECTILE DYSFUNCTION, NON-ORGANIC 02/16/2009   Hyperlipemia 01/14/2009   HYPERTENSION 01/14/2009    PCP: Maree Leni Edyth DELENA, MD  REFERRING PROVIDER: Jule Ronal CROME, PA-C  REFERRING DIAG: [phone] (ICD-10-CM) - Status post total replacement of right hip   THERAPY DIAG:  Muscle weakness (generalized)  Stiffness of right hip, not elsewhere classified  Pain in right hip  Other abnormalities of gait and  mobility  Unsteadiness on feet  Rationale for Evaluation and Treatment: Rehabilitation  ONSET DATE: 01/01/2024  right THA sg  SUBJECTIVE:   SUBJECTIVE STATEMENT: Patient reports that he feels like his balance is a little off still with walking.   PERTINENT HISTORY: right THA 01/01/24, OA, club foot deformities BLEs, CAD, Congenital Talipes Equinovarus, gout, HLD, HTN, MI, DM2  Patient underwent a right Total Hip Arthroplasty anterior approach on 01/01/2024 due to OA. HHPT until 02/26/2024. At office visit on 02/20/2024, he was still having posterior lateral hip pain.   DIAGNOSTIC FINDINGS: post-op X-rays show no abnormalities s/p THA  PAIN:  NPRS scale:  in last week 0-1/10 laying with heating pad;  with activities up to mostly 5/10 (7/10 1-2 days) Pain location: right hip posterior lateral Pain description: achy Aggravating factors: sitting or laying on left side,  Relieving factors: muscle relaxer, heating pad, pain meds when really needs it  PRECAUTIONS: Anterior hip  WEIGHT BEARING RESTRICTIONS: No  FALLS:  Has patient fallen in last 6 months? No  LIVING ENVIRONMENT: Lives with: lives alone Lives in: [place] one story Stairs: Yes: External: 4 steps; can reach both Has following equipment at home: Single point cane, Environmental Consultant - 2 wheeled, Wheelchair (manual), [employer] electric, Grab bars, and Ramped entry  OCCUPATION:  retired on disability  PLOF: Independent  PATIENT GOALS:   to function with hip pain, walk well in community, clean house  Next MD visit:  03/26/2024  OBJECTIVE:   PATIENT SURVEYS:  Patient-Specific Activity Scoring Scheme  0 represents unable to perform. 10 represents able to perform at prior level. 0 1 2 3 4 5 6 7 8 9  10 (Date and Score)  Activity Eval  04/08/24   1. Walking  4   4  2. Sleeping  2   8  3.  Cleaning the house 2 2  4.    5.    Score 2.67 4.67   Total score = sum of the activity scores/number of activities Minimum detectable  change (90%CI) for average score = 2 points Minimum detectable change (90%CI) for single activity score = 3 points  COGNITION: Overall cognitive status: WFL    SENSATION: WFL but has numbness in thigh ant-lat (feels touch)   EDEMA:  Patient appears to have localized edema present around the right hip especially laterally.  MUSCLE LENGTH: Hamstrings: Not formally tested but appears to be tight bilaterally Debby test: Not formally tested but appears to be tight bilaterally  POSTURE:  rounded shoulders, forward head, decreased lumbar lordosis, flexed trunk , and weight shift left  PALPATION: Scar has slight adhesion with rope feeling;  tenderness to posterior lateral hip esp over Piriformis & Glut Med  LOWER EXTREMITY ROM:   ROM Right eval Right 04/08/24  Hip flexion Standing  A: 61* Standing A: 75*  Hip extension Standing  A: 1* Standing  A: 5*  Hip abduction Standing  A: 2* Prior  to leaning Standing  A: 15*  Hip adduction    Hip internal rotation    Hip external rotation    Knee flexion    Knee extension    Ankle dorsiflexion    Ankle plantarflexion    Ankle inversion    Ankle eversion     (Blank rows = not tested)  LOWER EXTREMITY MMT:  MMT Right eval Left eval  Hip flexion 3-/5 5/5  Hip extension 3-/5 5/5  Hip abduction 3-/5 5/5  Hip adduction    Hip internal rotation    Hip external rotation    Knee flexion 4/5 5/5  Knee extension 4/5 5/5  Ankle dorsiflexion    Ankle plantarflexion    Ankle inversion    Ankle eversion     (Blank rows = not tested)   FUNCTIONAL TESTS:  04/15/2024:  back movements with lateral weight shift right & left, back extension & flexion does not change right buttock pain.   Evaluation: 18 inch chair transfer: able to arise without UE assist but uses back of legs against chair to stabilize 5 times Sit to/from stand:  22.19 sec  Timed up and go with cane 12.69 seconds and without an assistive device 11.97 seconds with  supervision.  Gait without a device patient had significantly increased gait dynamics indicating high fall risk.  GAIT: Distance walked: > 100' with cane and 20' without an assistive device Assistive device utilized: Single point cane and None Level of assistance: SBA Comments: Patient has significant antalgic gait with decreased stance duration RLE, abducted gait, step to gait pattern, early lacking trailing limb hip extension.  Gait Velocity:  04/29/2024 performed with no AD and SBA  Self-selected: 3.125 ft/s Fast as comfortable: 4.21 ft/s     [facility] PT Assessment - 04/29/24 0001       Functional Gait  Assessment   Gait assessed  Yes    Gait Level Surface Walks 20 ft in less than 7 sec but greater than 5.5 sec, uses assistive device, slower speed, mild gait deviations, or deviates 6-10 in outside of the 12 in walkway width.    Change in Gait Speed Able to change speed, demonstrates mild gait deviations, deviates 6-10 in outside of the 12 in walkway width, or no gait deviations, unable to achieve a major change in velocity, or uses a change in velocity, or uses an assistive device.    Gait with Horizontal Head Turns Performs head turns smoothly with no change in gait. Deviates no more than 6 in outside 12 in walkway width    Gait with Vertical Head Turns Performs head turns with no change in gait. Deviates no more than 6 in outside 12 in walkway width.    Gait and Pivot Turn Pivot turns safely within 3 sec and stops quickly with no loss of balance.    Step Over Obstacle Is able to step over 2 stacked shoe boxes taped together (9 in total height) without changing gait speed. No evidence of imbalance.    Gait with Narrow Base of Support Ambulates less than 4 steps heel to toe or cannot perform without assistance.    Gait with Eyes Closed Walks 20 ft, no assistive devices, good speed, no evidence of imbalance, normal gait pattern, deviates no more than 6 in outside 12 in walkway width.  Ambulates 20 ft in less than 7 sec.    Ambulating Backwards Walks 20 ft, no assistive devices, good speed, no evidence for imbalance, normal gait  Steps Alternating feet, must use rail.    Total Score 24    FGA comment: Max score 30  Interpretation: 25-28 = low risk fall   19-24 = medium risk fall  < 19 = high risk fall                                                                                                                                                                                TODAY'S TREATMENT                                                                          DATE: 04/29/2024 Physical Performance  FGA with close SBA  Results noted above and discussed with patient  Gait speed  Results noted above and discussed with patient   Neuro Re-Ed: Patient attempted tandem stance within parallel bars, though unable to perform appropriately without UE support  Semi-tandem stance 2x30s each side with only one instance of lateral LOB with Lt LE back; able to self-correct with UE use  PT discussed performing semi-tandem stance at home within a corner and a chair placed in front for maximum safety  PT discussed when to progress and how to progress  Reciprocal step taps to cone 1x20 taps with fingertip use on parallel bars for most of repetitions   Self-Care:  PT discussed POC with patient and importance of compliance with HEP in order to reach maximal benefits and safety when transitioning out of PT    TODAY'S TREATMENT                                                                          DATE: 04/22/2024 Therapeutic Exercise:  Recumbent bike seat 7 level 4 for 6 minutes   Hip flexor stretch standing with foot on 18 chair against wall 15 sec hold 2 reps 3 sets (moving stance foot 1 closer to chair)  with BLEs Knee to chest stretch 15 sec hold with ea LE then extending contralateral LE for gentle ext stretch 3 reps ea LE RLE Seated hip rotation internal & external 10  reps 2 sets ea motion.    Self-care: PT  demo & verbal cue on donning socks & shoes using foot stool. Pt verbalized understanding.  Pt return demo first with LLE then RLE.      TREATMENT                                                                          DATE: 04/15/2024 Therapeutic Exercise:  Recumbent bike seat 7 level 4 for 6 minutes   BLE hip & knee flexion with BLEs on red swiss ball 10 reps 2 sets Bridge with BLEs on red swiss ball 10 reps 2 sets Trunk rotation to left to stretch lateral right hip 15 sec hold 3 reps Supine piriformis stretch 15 sec hold 3 reps  Therapeutic Activities: See objective data  Self-care: PT demo & verbal cue on using towel roll in sitting to limit positioning in ER.  Pt verbalized understanding.   PT reviewed using tennis balls (2) in stockinet in sitting and standing against wall to massage area. Pt verbalized understanding.      TREATMENT                                                                          DATE: 04/08/2024 Therapeutic Exercise:  Recumbent bike seat 7 level 4 for 6 minutes   Gastroc stretch step heel depression 30 sec 3 reps and hamstring stretch foot on step 30 sec 3 reps;  PT discussed relationship to muscle flexibility to pain & balance.    Therapeutic Activities: Step up, over and down 6 step with using RLE with BUEs on //bars 10 reps 2 sets.  PT demo & verbal cues on technique.  Side / lateral step with 6 box RLE with BUEs on //bars 10 reps.  PT demo & verbal cues on technique.  Stepping to 3 cones (ant-lat, lat & post-lat) with ipsilateral UE support on counter 5 reps with each LE.   Functional squat 3 reps to pick up cones with focus on hip & knee flexion.  PT tactile cues.    Gait Training: PT demo & verbal cues on reaching heels forward with goal equal step length esp LLE step thru. Pt amb 40' X 8 without device carryover.   Pt amb 140' scanning environment right/left and up/down without device with supervision.    PT demo & verbal cues carrying cane to enable arm swing until his RLE fatigues or begins to be painful.  Pt able to return demo understanding.      TREATMENT                                                                          DATE: 03/20/2024 TherEx:  Recumbent bike le vel 4  for 8 minutes  PT educated and discussed on importance of bilateral and unilateral strengthening  Bilat leg press 125# for 2x12 with verbal cues for increased knee flexion; appropriate carryover  Unilat leg press 2x15 at 68# performing on both sides  Standing calf raises into toe raises 2x10 with bilat UE use on // bars  PT discussed with adding hip hikes and step fwd/back with Lt LE for home exercises to strengthen Rt LE during stance in order to improve towards not requiring SPC   Neuro Re-Ed: Step over and back with weightbearing through Rt LE trying to decrease UE use throughout to challenge balance and stance phase 2x10 Standing square taps with bilat UE use to increase weightbearing and balance through Rt LE stance 2x6 taps ; attempting to decrease bilat UE use during second round  Standing hip hikes with mirror used for visual feedback 2x10 with both sides     HOME EXERCISE PROGRAM: Access Code: [email or web address] URL: [email or web address] Date: 03/13/2024 Prepared by: Grayce Spatz  Exercises - Standing Hip Abduction: Alternating With Resistance Loop  - 1 x daily - 7 x weekly - 2 sets - 10 reps - 5 seconds hold - Hip Extension with Resistance Loop  - 1 x daily - 7 x weekly - 2 sets - 10 reps - 5 seconds hold - Standing Hip Flexion with Resistance Loop  - 1 x daily - 7 x weekly - 2 sets - 10 reps - 5 seconds hold - Standing Marching  - 1 x daily - 7 x weekly - 2 sets - 10 reps - 5 seconds hold - Sit to Stand with Armchair  - 1 x daily - 7 x weekly - 3 sets - 10 reps - Gastroc Stretch on Step  - 1-2 x daily - 7 x weekly - 1 sets - 3 reps - 30 seconds hold - Standing Hamstring Stretch with Step   - 1-2 x daily - 7 x weekly - 1 sets - 3 reps - 30 seconds hold - Seated Piriformis Stretch  - 1-2 x daily - 7 x weekly - 1 sets - 3 reps - 30 seconds hold - Tandem Walking with Counter Support  - 1 x daily - 7 x weekly - 3 sets - 10 reps - Carioca with Counter Support  - 1 x daily - 3 x weekly - 3 sets - 10 reps - [employer] on Counter  - 1 x daily - 7 x weekly - 12 sets - 10 reps  ASSESSMENT:  CLINICAL IMPRESSION: Patient arrived to session noting continued lack of balance while ambulating without an AD. Patient tolerated all activities this date and completed the FGA with a score of 24/30. Though that places him as a low fall risk, he would benefit from continued PT services in order to make him as safe as possible when transitioning out of PT.  Patient will continue to benefit from skilled PT.   OBJECTIVE IMPAIRMENTS: Abnormal gait, decreased activity tolerance, decreased balance, decreased endurance, decreased knowledge of condition, decreased knowledge of use of DME, decreased mobility, difficulty walking, decreased ROM, decreased strength, increased edema, increased muscle spasms, impaired flexibility, postural dysfunction, obesity, and pain.   ACTIVITY LIMITATIONS: carrying, lifting, bending, sitting, standing, squatting, sleeping, stairs, transfers, bed mobility, and locomotion level  PARTICIPATION LIMITATIONS: meal prep, cleaning, laundry, shopping, and community activity  PERSONAL FACTORS: Age, Fitness, Past/current experiences, Time since onset of injury/illness/exacerbation, and 3+ comorbidities: see PMH are also affecting patient's functional outcome.  REHAB POTENTIAL: Good  CLINICAL DECISION MAKING: Stable/uncomplicated  EVALUATION COMPLEXITY: Low   GOALS: Goals reviewed with patient? Yes  SHORT TERM GOALS: (target date for Short term goals 03/28/2024)   1.  Patient will demonstrate independent use of home exercise program to maintain progress from in clinic  treatments. Goal status:  MET    04/08/2024  2.  Patient reports right hip pain <5/10 with activities including sleeping Goal status:  partially MET     04/08/2024  LONG TERM GOALS: 06/24/2024   1. Patient will demonstrate/report pain at worst less than or equal to 2/10 to facilitate minimal limitation in daily activity secondary to pain symptoms. Goal status: Ongoing       04/22/2024   2. Patient will demonstrate independent use of home exercise program to facilitate ability to maintain/progress functional gains from skilled physical therapy services. Goal status: Ongoing        04/22/2024   3. Patient will demonstrate Patient specific functional scale avg > or = 6 to indicate reduced disability due to condition.  Goal status: Ongoing      04/22/2024   4.  Patient will demonstrate right hip LE MMT 5/5 throughout to faciltiate usual transfers, stairs, squatting at [facility] for daily life.  Goal status: Ongoing    04/22/2024   5.  Patient ambulates >500' and negotiate ramps and curbs without an assistive device independently. Goal status: Ongoing      04/22/2024   6.  Patient negotiates stairs with single rail alternating pattern modified independent. Goal status:   Ongoing   04/22/2024    PLAN:  PT FREQUENCY: 1x/week  PT DURATION: 8 weeks  PLANNED INTERVENTIONS: 97164- PT Re-evaluation, 97750- Physical Performance Testing, 97110-Therapeutic exercises, 97530- Therapeutic activity, [id number]- Neuromuscular re-education, 97535- Self Care, [id number]- Manual therapy, [id number]- Gait training, [phone]- Electrical stimulation (manual), [id number]- Ultrasound, [id number]- Ionotophoresis 4mg /ml Dexamethasone , 79439 (1-2 muscles), 20561 (3+ muscles)- Dry Needling, Patient/Family education, Balance training, Stair training, Taping, Scar mobilization, DME instructions, Cryotherapy, and Moist heat  PLAN FOR NEXT SESSION: static and dynamic balance (especially with narrow BOS), continued strengthening     Susannah Daring,  PT, DPT 04/29/2024 3:42 PM      "

## 2024-05-08 ENCOUNTER — Emergency Department (HOSPITAL_COMMUNITY): Payer: Medicare (Managed Care)

## 2024-05-08 ENCOUNTER — Encounter (HOSPITAL_COMMUNITY): Payer: Self-pay

## 2024-05-08 ENCOUNTER — Other Ambulatory Visit: Payer: Self-pay

## 2024-05-08 ENCOUNTER — Emergency Department (HOSPITAL_COMMUNITY)
Admission: EM | Admit: 2024-05-08 | Discharge: 2024-05-08 | Disposition: A | Discharge status: Home / Self Care | Payer: Medicare (Managed Care) | Attending: Emergency Medicine | Admitting: Emergency Medicine

## 2024-05-08 DIAGNOSIS — R000 Tachycardia, unspecified: Secondary | ICD-10-CM | POA: Insufficient documentation

## 2024-05-08 DIAGNOSIS — M25551 Pain in right hip: Secondary | ICD-10-CM | POA: Insufficient documentation

## 2024-05-08 DIAGNOSIS — Z79899 Other long term (current) drug therapy: Secondary | ICD-10-CM | POA: Insufficient documentation

## 2024-05-08 DIAGNOSIS — Z794 Long term (current) use of insulin: Secondary | ICD-10-CM | POA: Insufficient documentation

## 2024-05-08 DIAGNOSIS — R059 Cough, unspecified: Secondary | ICD-10-CM | POA: Diagnosis present

## 2024-05-08 DIAGNOSIS — Z7982 Long term (current) use of aspirin: Secondary | ICD-10-CM | POA: Insufficient documentation

## 2024-05-08 DIAGNOSIS — J101 Influenza due to other identified influenza virus with other respiratory manifestations: Secondary | ICD-10-CM | POA: Insufficient documentation

## 2024-05-08 LAB — CBC
HCT: 38.5 % — ABNORMAL LOW (ref 39.0–52.0)
Hemoglobin: 12.4 g/dL — ABNORMAL LOW (ref 13.0–17.0)
MCH: 22.2 pg — ABNORMAL LOW (ref 26.0–34.0)
MCHC: 32.2 g/dL (ref 30.0–36.0)
MCV: 69 fL — ABNORMAL LOW (ref 80.0–100.0)
Platelets: 261 K/uL (ref 150–400)
RBC: 5.58 MIL/uL (ref 4.22–5.81)
RDW: 16.1 % — ABNORMAL HIGH (ref 11.5–15.5)
WBC: 9.2 K/uL (ref 4.0–10.5)
nRBC: 0 % (ref 0.0–0.2)

## 2024-05-08 LAB — BASIC METABOLIC PANEL WITH GFR
Anion gap: 14 (ref 5–15)
BUN: 11 mg/dL (ref 8–23)
CO2: 21 mmol/L — ABNORMAL LOW (ref 22–32)
Calcium: 9 mg/dL (ref 8.9–10.3)
Chloride: 97 mmol/L — ABNORMAL LOW (ref 98–111)
Creatinine, Ser: 1.01 mg/dL (ref 0.61–1.24)
GFR, Estimated: >60 mL/min
Glucose, Bld: 146 mg/dL — ABNORMAL HIGH (ref 70–99)
Potassium: 4 mmol/L (ref 3.5–5.1)
Sodium: 133 mmol/L — ABNORMAL LOW (ref 135–145)

## 2024-05-08 LAB — TROPONIN T, HIGH SENSITIVITY
Troponin T High Sensitivity: <15 ng/L (ref 0–19)
Troponin T High Sensitivity: 15 ng/L (ref 0–19)

## 2024-05-08 LAB — RESP PANEL BY RT-PCR (RSV, FLU A&B, COVID)  RVPGX2
Influenza A by PCR: POSITIVE — AB
Influenza B by PCR: NEGATIVE
Resp Syncytial Virus by PCR: NEGATIVE
SARS Coronavirus 2 by RT PCR: NEGATIVE

## 2024-05-08 LAB — GROUP A STREP BY PCR: Group A Strep by PCR: NOT DETECTED

## 2024-05-08 MED ORDER — OSELTAMIVIR PHOSPHATE 75 MG PO CAPS: 75.0000 mg | ORAL_CAPSULE | Freq: Two times a day (BID) | ORAL | 0 refills | Status: AC

## 2024-05-08 MED ORDER — ACETAMINOPHEN 325 MG PO TABS
650.0000 mg | ORAL_TABLET | Freq: Once | ORAL | Status: AC | PRN
  Administered 2024-05-08: 650 mg via ORAL
  Filled 2024-05-08: qty 2

## 2024-05-08 NOTE — ED Provider Notes (Signed)
 " [place] [facility] Provider Note   CSN: [id number] Arrival date & time: 05/08/24  1209     Patient presents with: Generalized Body Aches, Shortness of Breath, Chest Pain, and Sore Throat   Daniel Perkins is a 68 y.o. male.    Shortness of Breath Associated symptoms: chest pain   Chest Pain Associated symptoms: shortness of breath   Sore Throat Associated symptoms include chest pain and shortness of breath.  Patient presents with chest pain shortness of breath URI symptoms.  Began yesterday per patient.  No fevers.  Has had myalgias however.     Prior to Admission medications  Medication Sig Start Date End Date Taking? Authorizing Provider  aspirin  (ASPIRIN  81) 81 MG chewable tablet Chew 1 tablet (81 mg total) by mouth 2 (two) times daily. To be taken after surgery to prevent blood clots 01/01/24   Jule Ronal CROME, PA-C  doxycycline  (VIBRAMYCIN ) 100 MG capsule Take 1 capsule (100 mg total) by mouth 2 (two) times daily. To be taken after surgery 12/29/23   Jule Ronal CROME, PA-C  methocarbamol  (ROBAXIN ) 750 MG tablet Take 1 tablet (750 mg total) by mouth 3 (three) times daily as needed. 12/29/23   Jule Ronal CROME, PA-C  ondansetron  (ZOFRAN ) 4 MG tablet Take 1 tablet (4 mg total) by mouth every 8 (eight) hours as needed for nausea or vomiting. 12/29/23   Jule Ronal CROME, PA-C  oseltamivir  (TAMIFLU ) 75 MG capsule Take 1 capsule (75 mg total) by mouth every 12 (twelve) hours. 05/08/24  Yes Patsey Lot, MD  Accu-Chek Softclix Lancets lancets Check once a day 10/11/19   Arminda Daved HERO, MD  allopurinol  (ZYLOPRIM ) 100 MG tablet Take 100 mg by mouth 2 (two) times daily. 02/26/22   [provider]  amLODipine  (NORVASC ) 10 MG tablet Take 10 mg by mouth daily.    [provider]  benzonatate  (TESSALON ) 100 MG capsule Take 1 capsule (100 mg total) by mouth every 8 (eight) hours. Patient not taking: Reported on 12/27/2023 07/26/23    Johnie Flaming A, NP  carvedilol  (COREG ) 6.25 MG tablet Take 6.25 mg by mouth 2 (two) times daily with a meal.    [provider]  chlorhexidine  (HIBICLENS ) 4 % external liquid Apply 15 mLs (1 Application total) topically as directed for 30 doses. Use as directed daily for 5 days every other week for 6 weeks. 01/01/24   Jule Ronal CROME, PA-C  diclofenac  Sodium (VOLTAREN ) 1 % GEL Apply 2 g topically 4 (four) times daily. Patient taking differently: Apply 1 Application topically daily as needed (pain). 04/09/21   Love, Sharlet RAMAN, PA-C  docusate sodium  (COLACE) 100 MG capsule Take 1 capsule (100 mg total) by mouth daily as needed. 12/29/23 12/28/24  Jule Ronal CROME, PA-C  gabapentin  (NEURONTIN ) 100 MG capsule Take 2 capsules (200 mg total) by mouth 2 (two) times daily. 08/17/21   Raulkar, Krutika P, MD  glucose blood (ACCU-CHEK GUIDE) test strip Check once a day 10/11/19   Arminda Daved HERO, MD  insulin  glargine (LANTUS ) 100 UNIT/ML Solostar Pen Inject 15 Units into the skin daily. 04/09/21   Love, Sharlet RAMAN, PA-C  Insulin  Pen Needle 32G X 4 MM MISC Use to inject victoza  one time a day 11/15/17   Jerrell Cleatus Ned, MD  methocarbamol  (ROBAXIN ) 500 MG tablet Take 1 tablet (500 mg total) by mouth 2 (two) times daily as needed. 03/26/24   Jule Ronal CROME, PA-C  methylPREDNISolone  (MEDROL  DOSEPAK) 4  MG TBPK tablet Take as directed 03/26/24   Jule Ronal CROME, PA-C  nitroGLYCERIN  (NITROSTAT ) 0.4 MG SL tablet DISSOLVE 1 TABLET UNDER THE TONGUE EVERY 5 MINUTES AS  NEEDED FOR CHEST PAIN , MAX 3 TABLETS IN 15 MINUTES,  CALL 911 IF NO RELIEF. 04/07/20   Katsadouros, Vasilios, MD  oxyCODONE -acetaminophen  (PERCOCET) 5-325 MG tablet Take 1-2 tablets by mouth every 6 (six) hours as needed for severe pain (pain score 7-10). 01/01/24 12/31/24  Jule Ronal CROME, PA-C  rosuvastatin  (CRESTOR ) 10 MG tablet Take 1 tablet (10 mg total) by mouth daily. 05/11/22   Dann Candyce RAMAN, MD  valsartan  (DIOVAN ) 160 MG tablet  Take 160 mg by mouth daily.    [provider]    Allergies: Morphine  and Pravastatin  sodium    Review of Systems  Respiratory:  Positive for shortness of breath.   Cardiovascular:  Positive for chest pain.    Updated Vital Signs BP (!) 155/88 (BP Location: Left Arm)   Pulse (!) 106   Temp 99.5 F (37.5 C)   Resp 18   Ht 5' 7 (1.702 m)   Wt 89.4 kg   SpO2 95%   BMI 30.85 kg/m   Physical Exam Vitals and nursing note reviewed.  Pulmonary:     Breath sounds: No wheezing.  Abdominal:     Tenderness: There is no abdominal tenderness.  Musculoskeletal:     Right lower leg: No edema.     Left lower leg: No edema.  Neurological:     Mental Status: He is alert.   Mild right hip tenderness.  (all labs ordered are listed, but only abnormal results are displayed) Labs Reviewed  RESP PANEL BY RT-PCR (RSV, FLU A&B, COVID)  RVPGX2 - Abnormal; Notable for the following components:      Result Value   Influenza A by PCR POSITIVE (*)    All other components within normal limits  BASIC METABOLIC PANEL WITH GFR - Abnormal; Notable for the following components:   Sodium 133 (*)    Chloride 97 (*)    CO2 21 (*)    Glucose, Bld 146 (*)    All other components within normal limits  CBC - Abnormal; Notable for the following components:   Hemoglobin 12.4 (*)    HCT 38.5 (*)    MCV 69.0 (*)    MCH 22.2 (*)    RDW 16.1 (*)    All other components within normal limits  GROUP A STREP BY PCR  TROPONIN T, HIGH SENSITIVITY  TROPONIN T, HIGH SENSITIVITY    EKG: EKG Interpretation Date/Time:  Wednesday May 08 2024 12:26:08 EST Ventricular Rate:  109 PR Interval:  168 QRS Duration:  88 QT Interval:  330 QTC Calculation: 444 R Axis:   -59  Text Interpretation: Sinus tachycardia Left anterior fascicular block Moderate voltage criteria for LVH, may be normal variant ( R in aVL , Cornell product ) Possible Lateral infarct , age undetermined Abnormal ECG No significant  change since last tracing Confirmed by Ellouise Fine ([id number]) on 05/08/2024 3:08:26 PM  Radiology: [facility] Hip Unilat  With Pelvis 2-3 Views Right Result Date: 05/08/2024 EXAM: 2 or more VIEW(S) XRAY OF THE BILATERAL HIPS 05/08/2024 12:54:55 PM COMPARISON: None available. 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 Mild degenerative changes of the left hip. 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 No acute fracture. No malalignment. SOFT TISSUES: Vascular calcifications. IMPRESSION:  1. No acute fracture or dislocation. Electronically signed by: Waddell Calk MD 05/08/2024 01:41 PM EST RP Workstation: [id number]   DG Chest 2 View Result Date: 05/08/2024 EXAM: 2 VIEW(S) XRAY OF THE CHEST 05/08/2024 12:54:55 PM COMPARISON: None available. CLINICAL HISTORY: chest pain, sob FINDINGS: LUNGS AND PLEURA: Chronic coarsened interstitial markings identified within the lung bases. No pleural effusion. No pneumothorax. HEART AND MEDIASTINUM: Calcified aorta. No acute abnormality of the cardiac and mediastinal silhouettes. BONES AND SOFT TISSUES: Cervical spine fixation hardware noted. Thoracic degenerative changes. IMPRESSION: 1. No acute findings. 2. Chronic coarsened interstitial markings within the lung bases. Electronically signed by: Waddell Calk MD 05/08/2024 01:39 PM EST RP Workstation: [id number]     Procedures   Medications Ordered in the ED  acetaminophen  (TYLENOL ) tablet 650 mg (650 mg Oral Given 05/08/24 1225)                                    Medical Decision Making Amount and/or Complexity of Data Reviewed Labs: ordered. Radiology: ordered.  Risk OTC drugs. Prescription drug management.   Patient with URI symptoms cough.  Fall.  Chest pain.  I think most likely all fluid related.  Mild tachycardia but not hypoxic.  X-ray done does not show pneumonia.  X-ray done does not show hip fracture.   Patient states symptoms started yesterday.  Will give Tamiflu  since only a day of symptoms.  Patient is high risk.  Appears stable for discharge home.     Final diagnoses:  Influenza A    ED Discharge Orders          Ordered    oseltamivir  (TAMIFLU ) 75 MG capsule  Every 12 hours        05/08/24 1917               Patsey Lot, MD 05/08/24 1930  "

## 2024-05-08 NOTE — ED Triage Notes (Addendum)
 Pt c.o flu like symptoms x 3-4 days: cough, sob, chest pain, body aches, headache.   Pt also c.o fall last night into his bathtub, pt thinks he passed out because he doesn't remember what happened ,pt c.o right hip pain. Hx of recent replacement surgery

## 2024-05-08 NOTE — ED Notes (Signed)
 EDP made aware of pts vital signs and agreed to follow through with discharge.

## 2024-05-08 NOTE — ED Provider Triage Note (Signed)
 Emergency Medicine Provider Triage Evaluation Note  Patient was moved to treatment area prior to being seen by PIT provider.   Claudene Lenis, NP 05/08/24 2226

## 2024-05-13 NOTE — Therapy (Incomplete)
 " OUTPATIENT PHYSICAL THERAPY LOWER EXTREMITY TREATMENT / RECERTIFICATION   Patient Name: Daniel Perkins MRN: [id number] DOB:01-26-1956, 69 y.o., male Today's Date: 05/13/2024  END OF SESSION:      Past Medical History:  Diagnosis Date   Arthritis    Club foot of both lower extremities 1958   Coronary artery disease    Holter monitor 02/2012 - sinus with rare PVC   CTEV (congenital talipes equinovarus)    Gout    Hyperlipidemia    Hypertension    Myocardial infarction (HCC) ~ 2007   mild   Osteomyelitis (HCC)    Pneumonia 2000's   couple times   Pneumonia due to COVID-19 virus 04/28/2019   Type II diabetes mellitus (HCC) 2011   Past Surgical History:  Procedure Laterality Date   ANTERIOR CERVICAL DECOMP/DISCECTOMY FUSION  X 2   CARDIAC CATHETERIZATION  07/2009   CLOSED REDUCTION NASAL FRACTURE     when 69 years old   CLOSED REDUCTION NASAL FRACTURE Bilateral 09/21/2021   Procedure: CLOSED REDUCTION NASAL FRACTURE;  Surgeon: Carlie Clark, MD;  Location: [facility];  Service: ENT;  Laterality: Bilateral;   FOOT FRACTURE SURGERY Right 01/07/1989   pt a steel plate in   FOOT SURGERY Left x16   joints collapsed; keep getting infected   LAPAROSCOPY N/A 03/05/2021   Procedure: LAPAROSCOPY DIAGNOSTIC;  Surgeon: Signe Mitzie LABOR, MD;  Location: [facility] OR;  Service: General;  Laterality: N/A;   LAPAROTOMY Right 03/05/2021   Procedure: EXPLORATORY LAPAROTOMY, REPAIR SMALL BOWEL PERFORATION;  Surgeon: Signe Mitzie LABOR, MD;  Location: [facility] OR;  Service: General;  Laterality: Right;   LYSIS OF ADHESION  03/05/2021   Procedure: LYSIS OF ADHESION;  Surgeon: Signe Mitzie LABOR, MD;  Location: [facility] OR;  Service: General;;   POSTERIOR CERVICAL FUSION/FORAMINOTOMY  X 2   TOTAL HIP ARTHROPLASTY Right 01/01/2024   Procedure: ARTHROPLASTY, HIP, TOTAL, ANTERIOR APPROACH;  Surgeon: Jerri Kay HERO, MD;  Location: [facility] OR;  Service: Orthopedics;  Laterality: Right;  3-C    Patient Active Problem List   Diagnosis Date Noted   Status post total replacement of right hip 01/01/2024   Primary osteoarthritis of right hip 12/31/2023   Hx SBO 04/12/2021   Wound dehiscence, surgical 04/12/2021   Essential hypertension    Diabetes mellitus type 2 in nonobese (HCC)    Debility 03/25/2021   Malnutrition of moderate degree 03/11/2021   Left hand pain 06/22/2020   Back pain 05/20/2020   Neck pain 08/27/2019   CKD (chronic kidney disease), 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   Hepatic steatosis 05/01/2019   Prostate hypertrophy 12/06/2018   Sleep apnea-like behavior 10/01/2014   Peripheral vascular disease (HCC) 02/10/2014   Microcytic anemia 02/06/2013   CTEV (congenital talipes equinovarus)    Statin intolerance 06/29/2012   Esophageal reflux 06/29/2012   Health care maintenance 06/28/2012   Gout 06/12/2012   Obesity, Class II, BMI 35-39.9 02/20/2012   DOE (dyspnea on exertion) 02/20/2012   Diabetes mellitus due to underlying condition, uncontrolled, with hyperglycemia (HCC) 09/06/2009   ERECTILE DYSFUNCTION, NON-ORGANIC 02/16/2009   Hyperlipemia 01/14/2009   HYPERTENSION 01/14/2009    PCP: Maree Leni Edyth LABOR, MD  REFERRING PROVIDER: Maree Leni Edyth LABOR, MD  REFERRING DIAG: [phone] (ICD-10-CM) - Status post total replacement of right hip   THERAPY DIAG:  No diagnosis found.  Rationale for Evaluation and Treatment: Rehabilitation  ONSET DATE: 01/01/2024  right THA sg  SUBJECTIVE:   SUBJECTIVE STATEMENT: *** Patient reports that he feels  like his balance is a little off still with walking.   PERTINENT HISTORY: right THA 01/01/24, OA, club foot deformities BLEs, CAD, Congenital Talipes Equinovarus, gout, HLD, HTN, MI, DM2  Patient underwent a right Total Hip Arthroplasty anterior approach on 01/01/2024 due to OA. HHPT until 02/26/2024. At office visit on 02/20/2024, he was still having posterior lateral hip pain.   DIAGNOSTIC FINDINGS: post-op X-rays show no  abnormalities s/p THA  PAIN:  NPRS scale:  in last week 0-1/10 laying with heating pad;  with activities up to mostly 5/10 (7/10 1-2 days) Pain location: right hip posterior lateral Pain description: achy Aggravating factors: sitting or laying on left side,  Relieving factors: muscle relaxer, heating pad, pain meds when really needs it  PRECAUTIONS: Anterior hip  WEIGHT BEARING RESTRICTIONS: No  FALLS:  Has patient fallen in last 6 months? No  LIVING ENVIRONMENT: Lives with: lives alone Lives in: [place] one story Stairs: Yes: External: 4 steps; can reach both Has following equipment at home: Single point cane, Environmental Consultant - 2 wheeled, Wheelchair (manual), [employer] electric, Grab bars, and Ramped entry  OCCUPATION:  retired on disability  PLOF: Independent  PATIENT GOALS:   to function with hip pain, walk well in community, clean house  Next MD visit:  03/26/2024  OBJECTIVE:   PATIENT SURVEYS:  Patient-Specific Activity Scoring Scheme  0 represents unable to perform. 10 represents able to perform at prior level. 0 1 2 3 4 5 6 7 8 9  10 (Date and Score)  Activity Eval  04/08/24   1. Walking  4   4  2. Sleeping  2   8  3.  Cleaning the house 2 2  4.    5.    Score 2.67 4.67   Total score = sum of the activity scores/number of activities Minimum detectable change (90%CI) for average score = 2 points Minimum detectable change (90%CI) for single activity score = 3 points  COGNITION: Overall cognitive status: WFL    SENSATION: WFL but has numbness in thigh ant-lat (feels touch)   EDEMA:  Patient appears to have localized edema present around the right hip especially laterally.  MUSCLE LENGTH: Hamstrings: Not formally tested but appears to be tight bilaterally Debby test: Not formally tested but appears to be tight bilaterally  POSTURE:  rounded shoulders, forward head, decreased lumbar lordosis, flexed trunk , and weight shift left  PALPATION: Scar has slight  adhesion with rope feeling;  tenderness to posterior lateral hip esp over Piriformis & Glut Med  LOWER EXTREMITY ROM:   ROM Right eval Right 04/08/24  Hip flexion Standing  A: 61* Standing A: 75*  Hip extension Standing  A: 1* Standing  A: 5*  Hip abduction Standing  A: 2* Prior to leaning Standing  A: 15*  Hip adduction    Hip internal rotation    Hip external rotation    Knee flexion    Knee extension    Ankle dorsiflexion    Ankle plantarflexion    Ankle inversion    Ankle eversion     (Blank rows = not tested)  LOWER EXTREMITY MMT:  MMT Right eval Left eval  Hip flexion 3-/5 5/5  Hip extension 3-/5 5/5  Hip abduction 3-/5 5/5  Hip adduction    Hip internal rotation    Hip external rotation    Knee flexion 4/5 5/5  Knee extension 4/5 5/5  Ankle dorsiflexion    Ankle plantarflexion    Ankle inversion  Ankle eversion     (Blank rows = not tested)   FUNCTIONAL TESTS:  04/15/2024:  back movements with lateral weight shift right & left, back extension & flexion does not change right buttock pain.   Evaluation: 18 inch chair transfer: able to arise without UE assist but uses back of legs against chair to stabilize 5 times Sit to/from stand:  22.19 sec  Timed up and go with cane 12.69 seconds and without an assistive device 11.97 seconds with supervision.  Gait without a device patient had significantly increased gait dynamics indicating high fall risk.  GAIT: Distance walked: > 100' with cane and 20' without an assistive device Assistive device utilized: Single point cane and None Level of assistance: SBA Comments: Patient has significant antalgic gait with decreased stance duration RLE, abducted gait, step to gait pattern, early lacking trailing limb hip extension.  Gait Velocity:  04/29/2024 performed with no AD and SBA  Self-selected: 3.125 ft/s Fast as comfortable: 4.21 ft/s     [facility] PT Assessment - 04/29/24 0001       Functional Gait   Assessment   Gait assessed  Yes    Gait Level Surface Walks 20 ft in less than 7 sec but greater than 5.5 sec, uses assistive device, slower speed, mild gait deviations, or deviates 6-10 in outside of the 12 in walkway width.    Change in Gait Speed Able to change speed, demonstrates mild gait deviations, deviates 6-10 in outside of the 12 in walkway width, or no gait deviations, unable to achieve a major change in velocity, or uses a change in velocity, or uses an assistive device.    Gait with Horizontal Head Turns Performs head turns smoothly with no change in gait. Deviates no more than 6 in outside 12 in walkway width    Gait with Vertical Head Turns Performs head turns with no change in gait. Deviates no more than 6 in outside 12 in walkway width.    Gait and Pivot Turn Pivot turns safely within 3 sec and stops quickly with no loss of balance.    Step Over Obstacle Is able to step over 2 stacked shoe boxes taped together (9 in total height) without changing gait speed. No evidence of imbalance.    Gait with Narrow Base of Support Ambulates less than 4 steps heel to toe or cannot perform without assistance.    Gait with Eyes Closed Walks 20 ft, no assistive devices, good speed, no evidence of imbalance, normal gait pattern, deviates no more than 6 in outside 12 in walkway width. Ambulates 20 ft in less than 7 sec.    Ambulating Backwards Walks 20 ft, no assistive devices, good speed, no evidence for imbalance, normal gait    Steps Alternating feet, must use rail.    Total Score 24    FGA comment: Max score 30  Interpretation: 25-28 = low risk fall   19-24 = medium risk fall  < 19 = high risk fall  TODAY'S TREATMENT                                                                          DATE: 05/15/2023 ***   TODAY'S  TREATMENT                                                                          DATE: 04/29/2024 Physical Performance  FGA with close SBA  Results noted above and discussed with patient  Gait speed  Results noted above and discussed with patient   Neuro Re-Ed: Patient attempted tandem stance within parallel bars, though unable to perform appropriately without UE support  Semi-tandem stance 2x30s each side with only one instance of lateral LOB with Lt LE back; able to self-correct with UE use  PT discussed performing semi-tandem stance at home within a corner and a chair placed in front for maximum safety  PT discussed when to progress and how to progress  Reciprocal step taps to cone 1x20 taps with fingertip use on parallel bars for most of repetitions   Self-Care:  PT discussed POC with patient and importance of compliance with HEP in order to reach maximal benefits and safety when transitioning out of PT    TODAY'S TREATMENT                                                                          DATE: 04/22/2024 Therapeutic Exercise:  Recumbent bike seat 7 level 4 for 6 minutes   Hip flexor stretch standing with foot on 18 chair against wall 15 sec hold 2 reps 3 sets (moving stance foot 1 closer to chair)  with BLEs Knee to chest stretch 15 sec hold with ea LE then extending contralateral LE for gentle ext stretch 3 reps ea LE RLE Seated hip rotation internal & external 10 reps 2 sets ea motion.    Self-care: PT demo & verbal cue on donning socks & shoes using foot stool. Pt verbalized understanding.  Pt return demo first with LLE then RLE.      TREATMENT                                                                          DATE: 04/15/2024 Therapeutic Exercise:  Recumbent bike seat 7 level 4 for 6 minutes   BLE hip & knee flexion with BLEs on red swiss ball 10 reps 2 sets Bridge with  BLEs on red swiss ball 10 reps 2 sets Trunk rotation to left to stretch lateral right  hip 15 sec hold 3 reps Supine piriformis stretch 15 sec hold 3 reps  Therapeutic Activities: See objective data  Self-care: PT demo & verbal cue on using towel roll in sitting to limit positioning in ER.  Pt verbalized understanding.   PT reviewed using tennis balls (2) in stockinet in sitting and standing against wall to massage area. Pt verbalized understanding.        HOME EXERCISE PROGRAM: Access Code: [email or web address] URL: [email or web address] Date: 03/13/2024 Prepared by: Grayce Spatz  Exercises - Standing Hip Abduction: Alternating With Resistance Loop  - 1 x daily - 7 x weekly - 2 sets - 10 reps - 5 seconds hold - Hip Extension with Resistance Loop  - 1 x daily - 7 x weekly - 2 sets - 10 reps - 5 seconds hold - Standing Hip Flexion with Resistance Loop  - 1 x daily - 7 x weekly - 2 sets - 10 reps - 5 seconds hold - Standing Marching  - 1 x daily - 7 x weekly - 2 sets - 10 reps - 5 seconds hold - Sit to Stand with Armchair  - 1 x daily - 7 x weekly - 3 sets - 10 reps - Gastroc Stretch on Step  - 1-2 x daily - 7 x weekly - 1 sets - 3 reps - 30 seconds hold - Standing Hamstring Stretch with Step  - 1-2 x daily - 7 x weekly - 1 sets - 3 reps - 30 seconds hold - Seated Piriformis Stretch  - 1-2 x daily - 7 x weekly - 1 sets - 3 reps - 30 seconds hold - Tandem Walking with Counter Support  - 1 x daily - 7 x weekly - 3 sets - 10 reps - Carioca with Counter Support  - 1 x daily - 3 x weekly - 3 sets - 10 reps - [employer] on Counter  - 1 x daily - 7 x weekly - 12 sets - 10 reps  ASSESSMENT:  CLINICAL IMPRESSION: *** Patient arrived to session noting continued lack of balance while ambulating without an AD. Patient tolerated all activities this date and completed the FGA with a score of 24/30. Though that places him as a low fall risk, he would benefit from continued PT services in order to make him as safe as possible when transitioning out of PT.  Patient will  continue to benefit from skilled PT.   OBJECTIVE IMPAIRMENTS: Abnormal gait, decreased activity tolerance, decreased balance, decreased endurance, decreased knowledge of condition, decreased knowledge of use of DME, decreased mobility, difficulty walking, decreased ROM, decreased strength, increased edema, increased muscle spasms, impaired flexibility, postural dysfunction, obesity, and pain.   ACTIVITY LIMITATIONS: carrying, lifting, bending, sitting, standing, squatting, sleeping, stairs, transfers, bed mobility, and locomotion level  PARTICIPATION LIMITATIONS: meal prep, cleaning, laundry, shopping, and community activity  PERSONAL FACTORS: Age, Fitness, Past/current experiences, Time since onset of injury/illness/exacerbation, and 3+ comorbidities: see PMH are also affecting patient's functional outcome.   REHAB POTENTIAL: Good  CLINICAL DECISION MAKING: Stable/uncomplicated  EVALUATION COMPLEXITY: Low   GOALS: Goals reviewed with patient? Yes  SHORT TERM GOALS: (target date for Short term goals 03/28/2024)   1.  Patient will demonstrate independent use of home exercise program to maintain progress from in clinic treatments. Goal status:  MET    04/08/2024  2.  Patient reports right hip pain <5/10 with  activities including sleeping Goal status:  partially MET     04/08/2024  LONG TERM GOALS: 06/24/2024   1. Patient will demonstrate/report pain at worst less than or equal to 2/10 to facilitate minimal limitation in daily activity secondary to pain symptoms. Goal status: Ongoing       04/22/2024   2. Patient will demonstrate independent use of home exercise program to facilitate ability to maintain/progress functional gains from skilled physical therapy services. Goal status: Ongoing        04/22/2024   3. Patient will demonstrate Patient specific functional scale avg > or = 6 to indicate reduced disability due to condition.  Goal status: Ongoing      04/22/2024   4.  Patient  will demonstrate right hip LE MMT 5/5 throughout to faciltiate usual transfers, stairs, squatting at [facility] for daily life.  Goal status: Ongoing    04/22/2024   5.  Patient ambulates >500' and negotiate ramps and curbs without an assistive device independently. Goal status: Ongoing      04/22/2024   6.  Patient negotiates stairs with single rail alternating pattern modified independent. Goal status:   Ongoing   04/22/2024    PLAN:  PT FREQUENCY: 1x/week  PT DURATION: 8 weeks  PLANNED INTERVENTIONS: 97164- PT Re-evaluation, 97750- Physical Performance Testing, 97110-Therapeutic exercises, 97530- Therapeutic activity, [id number]- Neuromuscular re-education, 97535- Self Care, [id number]- Manual therapy, [id number]- Gait training, [phone]- Electrical stimulation (manual), [id number]- Ultrasound, [id number]- Ionotophoresis 4mg /ml Dexamethasone , 79439 (1-2 muscles), [id number] (3+ muscles)- Dry Needling, Patient/Family education, Balance training, Stair training, Taping, Scar mobilization, DME instructions, Cryotherapy, and Moist heat  PLAN FOR NEXT SESSION:***  static and dynamic balance (especially with narrow BOS), continued strengthening     Susannah Daring, PT, DPT 05/13/2024 12:43 PM      "

## 2024-05-17 NOTE — Therapy (Signed)
 " OUTPATIENT PHYSICAL THERAPY LOWER EXTREMITY TREATMENT   Patient Name: Daniel Perkins MRN: [id number] DOB:May 05, 1956, 69 y.o., male Today's Date: 05/20/2024  END OF SESSION:  PT End of Session - 05/20/24 1149     Visit Number 13    Number of Visits 20    Date for Recertification  06/24/24    Authorization Type Medicare Advantage    Progress Note Due on Visit 10    PT Start Time 1149    PT Stop Time 1228    PT Time Calculation (min) 39 min    Equipment Utilized During Treatment Gait belt    Activity Tolerance Patient tolerated treatment well;Patient limited by pain    Behavior During Therapy WFL for tasks assessed/performed             Past Medical History:  Diagnosis Date   Arthritis    Club foot of both lower extremities 1958   Coronary artery disease    Holter monitor 02/2012 - sinus with rare PVC   CTEV (congenital talipes equinovarus)    Gout    Hyperlipidemia    Hypertension    Myocardial infarction (HCC) ~ 2007   mild   Osteomyelitis (HCC)    Pneumonia 2000's   couple times   Pneumonia due to COVID-19 virus 04/28/2019   Type II diabetes mellitus (HCC) 2011   Past Surgical History:  Procedure Laterality Date   ANTERIOR CERVICAL DECOMP/DISCECTOMY FUSION  X 2   CARDIAC CATHETERIZATION  07/2009   CLOSED REDUCTION NASAL FRACTURE     when 69 years old   CLOSED REDUCTION NASAL FRACTURE Bilateral 09/21/2021   Procedure: CLOSED REDUCTION NASAL FRACTURE;  Surgeon: Carlie Clark, MD;  Location: [facility];  Service: ENT;  Laterality: Bilateral;   FOOT FRACTURE SURGERY Right 01/07/1989   pt a steel plate in   FOOT SURGERY Left x16   joints collapsed; keep getting infected   LAPAROSCOPY N/A 03/05/2021   Procedure: LAPAROSCOPY DIAGNOSTIC;  Surgeon: Signe Mitzie LABOR, MD;  Location: [facility] OR;  Service: General;  Laterality: N/A;   LAPAROTOMY Right 03/05/2021   Procedure: EXPLORATORY LAPAROTOMY, REPAIR SMALL BOWEL PERFORATION;  Surgeon:  Signe Mitzie LABOR, MD;  Location: [facility] OR;  Service: General;  Laterality: Right;   LYSIS OF ADHESION  03/05/2021   Procedure: LYSIS OF ADHESION;  Surgeon: Signe Mitzie LABOR, MD;  Location: [facility] OR;  Service: General;;   POSTERIOR CERVICAL FUSION/FORAMINOTOMY  X 2   TOTAL HIP ARTHROPLASTY Right 01/01/2024   Procedure: ARTHROPLASTY, HIP, TOTAL, ANTERIOR APPROACH;  Surgeon: Jerri Kay HERO, MD;  Location: [facility] OR;  Service: Orthopedics;  Laterality: Right;  3-C   Patient Active Problem List   Diagnosis Date Noted   Status post total replacement of right hip 01/01/2024   Primary osteoarthritis of right hip 12/31/2023   Hx SBO 04/12/2021   Wound dehiscence, surgical 04/12/2021   Essential hypertension    Diabetes mellitus type 2 in nonobese (HCC)    Debility 03/25/2021   Malnutrition of moderate degree 03/11/2021   Left hand pain 06/22/2020   Back pain 05/20/2020   Neck pain 08/27/2019   CKD (chronic kidney disease), 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   Hepatic steatosis 05/01/2019   Prostate hypertrophy 12/06/2018   Sleep apnea-like behavior 10/01/2014   Peripheral vascular disease (HCC) 02/10/2014   Microcytic anemia 02/06/2013   CTEV (congenital talipes equinovarus)    Statin intolerance 06/29/2012   Esophageal reflux 06/29/2012   Health care maintenance 06/28/2012   Gout 06/12/2012  Obesity, Class II, BMI 35-39.9 02/20/2012   DOE (dyspnea on exertion) 02/20/2012   Diabetes mellitus due to underlying condition, uncontrolled, with hyperglycemia (HCC) 09/06/2009   ERECTILE DYSFUNCTION, NON-ORGANIC 02/16/2009   Hyperlipemia 01/14/2009   HYPERTENSION 01/14/2009    PCP: Maree Leni Edyth DELENA, MD  REFERRING PROVIDER: Jule Ronal CROME, PA-C  REFERRING DIAG: [phone] (ICD-10-CM) - Status post total replacement of right hip   THERAPY DIAG:  Stiffness of right hip, not elsewhere classified  Muscle weakness (generalized)  Pain in right hip  Other abnormalities of gait and mobility  Unsteadiness on  feet  Rationale for Evaluation and Treatment: Rehabilitation  ONSET DATE: 01/01/2024  right THA sg  SUBJECTIVE:   SUBJECTIVE STATEMENT: Patient reports that he is having some shortness of breath from recent bout of the flu, but is slowly recovering. Patient also reports one fall where he is not sure what happened, but he landed on the side of the bath tub.   PERTINENT HISTORY: right THA 01/01/24, OA, club foot deformities BLEs, CAD, Congenital Talipes Equinovarus, gout, HLD, HTN, MI, DM2  Patient underwent a right Total Hip Arthroplasty anterior approach on 01/01/2024 due to OA. HHPT until 02/26/2024. At office visit on 02/20/2024, he was still having posterior lateral hip pain.   DIAGNOSTIC FINDINGS: post-op X-rays show no abnormalities s/p THA  PAIN:  NPRS scale:  6.5/10 at beginning of session Pain location: right hip posterior lateral Pain description: achy Aggravating factors: sitting or laying on left side,  Relieving factors: muscle relaxer, heating pad, pain meds when really needs it  PRECAUTIONS: Anterior hip  WEIGHT BEARING RESTRICTIONS: No  FALLS:  Has patient fallen in last 6 months? No  LIVING ENVIRONMENT: Lives with: lives alone Lives in: [place] one story Stairs: Yes: External: 4 steps; can reach both Has following equipment at home: Single point cane, Environmental Consultant - 2 wheeled, Wheelchair (manual), [employer] electric, Grab bars, and Ramped entry  OCCUPATION:  retired on disability  PLOF: Independent  PATIENT GOALS:   to function with hip pain, walk well in community, clean house  Next MD visit:  03/26/2024  OBJECTIVE:   PATIENT SURVEYS:  Patient-Specific Activity Scoring Scheme  0 represents unable to perform. 10 represents able to perform at prior level. 0 1 2 3 4 5 6 7 8 9  10 (Date and Score)  Activity Eval  04/08/24   1. Walking  4   4  2. Sleeping  2   8  3.  Cleaning the house 2 2  4.    5.    Score 2.67 4.67   Total score = sum of the activity  scores/number of activities Minimum detectable change (90%CI) for average score = 2 points Minimum detectable change (90%CI) for single activity score = 3 points  COGNITION: Overall cognitive status: WFL    SENSATION: WFL but has numbness in thigh ant-lat (feels touch)   EDEMA:  Patient appears to have localized edema present around the right hip especially laterally.  MUSCLE LENGTH: Hamstrings: Not formally tested but appears to be tight bilaterally Debby test: Not formally tested but appears to be tight bilaterally  POSTURE:  rounded shoulders, forward head, decreased lumbar lordosis, flexed trunk , and weight shift left  PALPATION: Scar has slight adhesion with rope feeling;  tenderness to posterior lateral hip esp over Piriformis & Glut Med  LOWER EXTREMITY ROM:   ROM Right eval Right 04/08/24  Hip flexion Standing  A: 61* Standing A: 75*  Hip extension Standing  A: 1* Standing  A: 5*  Hip abduction Standing  A: 2* Prior to leaning Standing  A: 15*  Hip adduction    Hip internal rotation    Hip external rotation    Knee flexion    Knee extension    Ankle dorsiflexion    Ankle plantarflexion    Ankle inversion    Ankle eversion     (Blank rows = not tested)  LOWER EXTREMITY MMT:  MMT Right eval Left eval  Hip flexion 3-/5 5/5  Hip extension 3-/5 5/5  Hip abduction 3-/5 5/5  Hip adduction    Hip internal rotation    Hip external rotation    Knee flexion 4/5 5/5  Knee extension 4/5 5/5  Ankle dorsiflexion    Ankle plantarflexion    Ankle inversion    Ankle eversion     (Blank rows = not tested)   FUNCTIONAL TESTS:  04/15/2024:  back movements with lateral weight shift right & left, back extension & flexion does not change right buttock pain.   Evaluation: 18 inch chair transfer: able to arise without UE assist but uses back of legs against chair to stabilize 5 times Sit to/from stand:  22.19 sec  Timed up and go with cane 12.69 seconds and  without an assistive device 11.97 seconds with supervision.  Gait without a device patient had significantly increased gait dynamics indicating high fall risk.  GAIT: Distance walked: > 100' with cane and 20' without an assistive device Assistive device utilized: Single point cane and None Level of assistance: SBA Comments: Patient has significant antalgic gait with decreased stance duration RLE, abducted gait, step to gait pattern, early lacking trailing limb hip extension.  Gait Velocity:  04/29/2024 performed with no AD and SBA  Self-selected: 3.125 ft/s Fast as comfortable: 4.21 ft/s     [facility] PT Assessment - 04/29/24 0001       Functional Gait  Assessment   Gait assessed  Yes    Gait Level Surface Walks 20 ft in less than 7 sec but greater than 5.5 sec, uses assistive device, slower speed, mild gait deviations, or deviates 6-10 in outside of the 12 in walkway width.    Change in Gait Speed Able to change speed, demonstrates mild gait deviations, deviates 6-10 in outside of the 12 in walkway width, or no gait deviations, unable to achieve a major change in velocity, or uses a change in velocity, or uses an assistive device.    Gait with Horizontal Head Turns Performs head turns smoothly with no change in gait. Deviates no more than 6 in outside 12 in walkway width    Gait with Vertical Head Turns Performs head turns with no change in gait. Deviates no more than 6 in outside 12 in walkway width.    Gait and Pivot Turn Pivot turns safely within 3 sec and stops quickly with no loss of balance.    Step Over Obstacle Is able to step over 2 stacked shoe boxes taped together (9 in total height) without changing gait speed. No evidence of imbalance.    Gait with Narrow Base of Support Ambulates less than 4 steps heel to toe or cannot perform without assistance.    Gait with Eyes Closed Walks 20 ft, no assistive devices, good speed, no evidence of imbalance, normal gait pattern, deviates no more  than 6 in outside 12 in walkway width. Ambulates 20 ft in less than 7 sec.    Ambulating Backwards Walks 20  ft, no assistive devices, good speed, no evidence for imbalance, normal gait    Steps Alternating feet, must use rail.    Total Score 24    FGA comment: Max score 30  Interpretation: 25-28 = low risk fall   19-24 = medium risk fall  < 19 = high risk fall                                                                                                                                                                                TODAY'S TREATMENT                                                                          DATE: 05/21/2023 TherEx:  Recumbent bike level 1 for 6 minutes  PT discussed how pain can affect movement/mobility and how recovering from sickness can change functional mobility as well  Neuro Re-Ed: 1x30s each foot back semi tandem stance with one instance of UE use on // bars with Lt ft fwd; improved overall since last session  2x30s each foot back semi tandem stance with decreased space between feet to increase challenge; intermittent UE use on // bars to self correct for lateral LOB  Reciprocal step taps to cone 2x20 taps; unilat UE use on // bars for first round, intermittent UE use on // bars during second round with increased challenge  Step over and back with the small hurdle 2x10 stepping with each LE; intermittent UE use for lateral LOB and few instances of catching foot on hurdle and requiring bilat UE use and stepping reaction for balance  Lateral walks on foam with stepping off and on for change in surface 1x3 down and back with several instances of lateral and fwd/back LOB requiring UE use on // bars  PT discussed how compliant surfaces are more challenging than firm surfaces    TODAY'S TREATMENT                                                                          DATE: 04/29/2024 Physical Performance  FGA with close SBA  Results noted above and  discussed with patient  Gait speed  Results noted above and discussed with patient   Neuro Re-Ed: Patient attempted tandem stance within parallel bars, though unable to perform appropriately without UE support  Semi-tandem stance 2x30s each side with only one instance of lateral LOB with Lt LE back; able to self-correct with UE use  PT discussed performing semi-tandem stance at home within a corner and a chair placed in front for maximum safety  PT discussed when to progress and how to progress  Reciprocal step taps to cone 1x20 taps with fingertip use on parallel bars for most of repetitions   Self-Care:  PT discussed POC with patient and importance of compliance with HEP in order to reach maximal benefits and safety when transitioning out of PT    TODAY'S TREATMENT                                                                          DATE: 04/22/2024 Therapeutic Exercise:  Recumbent bike seat 7 level 4 for 6 minutes   Hip flexor stretch standing with foot on 18 chair against wall 15 sec hold 2 reps 3 sets (moving stance foot 1 closer to chair)  with BLEs Knee to chest stretch 15 sec hold with ea LE then extending contralateral LE for gentle ext stretch 3 reps ea LE RLE Seated hip rotation internal & external 10 reps 2 sets ea motion.    Self-care: PT demo & verbal cue on donning socks & shoes using foot stool. Pt verbalized understanding.  Pt return demo first with LLE then RLE.      TREATMENT                                                                          DATE: 04/15/2024 Therapeutic Exercise:  Recumbent bike seat 7 level 4 for 6 minutes   BLE hip & knee flexion with BLEs on red swiss ball 10 reps 2 sets Bridge with BLEs on red swiss ball 10 reps 2 sets Trunk rotation to left to stretch lateral right hip 15 sec hold 3 reps Supine piriformis stretch 15 sec hold 3 reps  Therapeutic Activities: See objective data  Self-care: PT demo & verbal cue on using towel roll  in sitting to limit positioning in ER.  Pt verbalized understanding.   PT reviewed using tennis balls (2) in stockinet in sitting and standing against wall to massage area. Pt verbalized understanding.        HOME EXERCISE PROGRAM: Access Code: [email or web address] URL: [email or web address] Date: 03/13/2024 Prepared by: Grayce Spatz  Exercises - Standing Hip Abduction: Alternating With Resistance Loop  - 1 x daily - 7 x weekly - 2 sets - 10 reps - 5 seconds hold - Hip Extension with Resistance Loop  - 1 x daily - 7 x weekly - 2 sets - 10 reps - 5 seconds hold - Standing Hip Flexion with Resistance Loop  - 1 x daily - 7 x weekly -  2 sets - 10 reps - 5 seconds hold - Standing Marching  - 1 x daily - 7 x weekly - 2 sets - 10 reps - 5 seconds hold - Sit to Stand with Armchair  - 1 x daily - 7 x weekly - 3 sets - 10 reps - Gastroc Stretch on Step  - 1-2 x daily - 7 x weekly - 1 sets - 3 reps - 30 seconds hold - Standing Hamstring Stretch with Step  - 1-2 x daily - 7 x weekly - 1 sets - 3 reps - 30 seconds hold - Seated Piriformis Stretch  - 1-2 x daily - 7 x weekly - 1 sets - 3 reps - 30 seconds hold - Tandem Walking with Counter Support  - 1 x daily - 7 x weekly - 3 sets - 10 reps - Carioca with Counter Support  - 1 x daily - 3 x weekly - 3 sets - 10 reps - [employer] on Counter  - 1 x daily - 7 x weekly - 12 sets - 10 reps  ASSESSMENT:  CLINICAL IMPRESSION:  Patient arrived to session following bout of flu as well as one fall onto Rt hip. Patient's hip was cleared with xray while at hospital. Patient tolerated all activities this date, though required increased time for seated rest breaks secondary to fatigue and shortness of breath.  Patient will continue to benefit from skilled PT.   OBJECTIVE IMPAIRMENTS: Abnormal gait, decreased activity tolerance, decreased balance, decreased endurance, decreased knowledge of condition, decreased knowledge of use of DME, decreased mobility,  difficulty walking, decreased ROM, decreased strength, increased edema, increased muscle spasms, impaired flexibility, postural dysfunction, obesity, and pain.   ACTIVITY LIMITATIONS: carrying, lifting, bending, sitting, standing, squatting, sleeping, stairs, transfers, bed mobility, and locomotion level  PARTICIPATION LIMITATIONS: meal prep, cleaning, laundry, shopping, and community activity  PERSONAL FACTORS: Age, Fitness, Past/current experiences, Time since onset of injury/illness/exacerbation, and 3+ comorbidities: see PMH are also affecting patient's functional outcome.   REHAB POTENTIAL: Good  CLINICAL DECISION MAKING: Stable/uncomplicated  EVALUATION COMPLEXITY: Low   GOALS: Goals reviewed with patient? Yes  SHORT TERM GOALS: (target date for Short term goals 03/28/2024)   1.  Patient will demonstrate independent use of home exercise program to maintain progress from in clinic treatments. Goal status:  MET    04/08/2024  2.  Patient reports right hip pain <5/10 with activities including sleeping Goal status:  partially MET     04/08/2024  LONG TERM GOALS: 06/24/2024   1. Patient will demonstrate/report pain at worst less than or equal to 2/10 to facilitate minimal limitation in daily activity secondary to pain symptoms. Goal status: Ongoing       04/22/2024   2. Patient will demonstrate independent use of home exercise program to facilitate ability to maintain/progress functional gains from skilled physical therapy services. Goal status: Ongoing        04/22/2024   3. Patient will demonstrate Patient specific functional scale avg > or = 6 to indicate reduced disability due to condition.  Goal status: Ongoing      04/22/2024   4.  Patient will demonstrate right hip LE MMT 5/5 throughout to faciltiate usual transfers, stairs, squatting at [facility] for daily life.  Goal status: Ongoing    04/22/2024   5.  Patient ambulates >500' and negotiate ramps and curbs without an  assistive device independently. Goal status: Ongoing      04/22/2024   6.  Patient negotiates stairs  with single rail alternating pattern modified independent. Goal status:   Ongoing   04/22/2024    PLAN:  PT FREQUENCY: 1x/week  PT DURATION: 8 weeks  PLANNED INTERVENTIONS: 97164- PT Re-evaluation, 97750- Physical Performance Testing, 97110-Therapeutic exercises, 97530- Therapeutic activity, [id number]- Neuromuscular re-education, 97535- Self Care, [id number]- Manual therapy, [id number]- Gait training, [phone]- Electrical stimulation (manual), [id number]- Ultrasound, [id number]- Ionotophoresis 4mg /ml Dexamethasone , 79439 (1-2 muscles), 20561 (3+ muscles)- Dry Needling, Patient/Family education, Balance training, Stair training, Taping, Scar mobilization, DME instructions, Cryotherapy, and Moist heat  PLAN FOR NEXT SESSION:  static and dynamic balance (especially with narrow BOS), continued strengthening     Susannah Daring, PT, DPT 05/20/2024 12:44 PM      "

## 2024-05-20 ENCOUNTER — Ambulatory Visit: Payer: Medicare (Managed Care)

## 2024-05-20 DIAGNOSIS — R2689 Other abnormalities of gait and mobility: Secondary | ICD-10-CM | POA: Diagnosis not present

## 2024-05-20 DIAGNOSIS — M6281 Muscle weakness (generalized): Secondary | ICD-10-CM | POA: Diagnosis not present

## 2024-05-20 DIAGNOSIS — M25651 Stiffness of right hip, not elsewhere classified: Secondary | ICD-10-CM

## 2024-05-20 DIAGNOSIS — R2681 Unsteadiness on feet: Secondary | ICD-10-CM

## 2024-05-20 DIAGNOSIS — M25551 Pain in right hip: Secondary | ICD-10-CM

## 2024-05-24 NOTE — Therapy (Signed)
 " OUTPATIENT PHYSICAL THERAPY LOWER EXTREMITY TREATMENT   Patient Name: Daniel Perkins MRN: [id number] DOB:12-30-1955, 69 y.o., male Today's Date: 05/27/2024  END OF SESSION:  PT End of Session - 05/27/24 0803     Visit Number 14    Number of Visits 20    Date for Recertification  06/24/24    Authorization Type Medicare Advantage    Progress Note Due on Visit 10    PT Start Time 0803    PT Stop Time 0843    PT Time Calculation (min) 40 min    Equipment Utilized During Treatment Gait belt    Activity Tolerance Patient tolerated treatment well;Patient limited by pain    Behavior During Therapy WFL for tasks assessed/performed              Past Medical History:  Diagnosis Date   Arthritis    Club foot of both lower extremities 1958   Coronary artery disease    Holter monitor 02/2012 - sinus with rare PVC   CTEV (congenital talipes equinovarus)    Gout    Hyperlipidemia    Hypertension    Myocardial infarction (HCC) ~ 2007   mild   Osteomyelitis (HCC)    Pneumonia 2000's   couple times   Pneumonia due to COVID-19 virus 04/28/2019   Type II diabetes mellitus (HCC) 2011   Past Surgical History:  Procedure Laterality Date   ANTERIOR CERVICAL DECOMP/DISCECTOMY FUSION  X 2   CARDIAC CATHETERIZATION  07/2009   CLOSED REDUCTION NASAL FRACTURE     when 69 years old   CLOSED REDUCTION NASAL FRACTURE Bilateral 09/21/2021   Procedure: CLOSED REDUCTION NASAL FRACTURE;  Surgeon: Carlie Clark, MD;  Location: [facility];  Service: ENT;  Laterality: Bilateral;   FOOT FRACTURE SURGERY Right 01/07/1989   pt a steel plate in   FOOT SURGERY Left x16   joints collapsed; keep getting infected   LAPAROSCOPY N/A 03/05/2021   Procedure: LAPAROSCOPY DIAGNOSTIC;  Surgeon: Signe Mitzie LABOR, MD;  Location: [facility] OR;  Service: General;  Laterality: N/A;   LAPAROTOMY Right 03/05/2021   Procedure: EXPLORATORY LAPAROTOMY, REPAIR SMALL BOWEL PERFORATION;  Surgeon:  Signe Mitzie LABOR, MD;  Location: [facility] OR;  Service: General;  Laterality: Right;   LYSIS OF ADHESION  03/05/2021   Procedure: LYSIS OF ADHESION;  Surgeon: Signe Mitzie LABOR, MD;  Location: [facility] OR;  Service: General;;   POSTERIOR CERVICAL FUSION/FORAMINOTOMY  X 2   TOTAL HIP ARTHROPLASTY Right 01/01/2024   Procedure: ARTHROPLASTY, HIP, TOTAL, ANTERIOR APPROACH;  Surgeon: Jerri Kay HERO, MD;  Location: [facility] OR;  Service: Orthopedics;  Laterality: Right;  3-C   Patient Active Problem List   Diagnosis Date Noted   Status post total replacement of right hip 01/01/2024   Primary osteoarthritis of right hip 12/31/2023   Hx SBO 04/12/2021   Wound dehiscence, surgical 04/12/2021   Essential hypertension    Diabetes mellitus type 2 in nonobese (HCC)    Debility 03/25/2021   Malnutrition of moderate degree 03/11/2021   Left hand pain 06/22/2020   Back pain 05/20/2020   Neck pain 08/27/2019   CKD (chronic kidney disease), 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   Hepatic steatosis 05/01/2019   Prostate hypertrophy 12/06/2018   Sleep apnea-like behavior 10/01/2014   Peripheral vascular disease (HCC) 02/10/2014   Microcytic anemia 02/06/2013   CTEV (congenital talipes equinovarus)    Statin intolerance 06/29/2012   Esophageal reflux 06/29/2012   Health care maintenance 06/28/2012   Gout 06/12/2012  Obesity, Class II, BMI 35-39.9 02/20/2012   DOE (dyspnea on exertion) 02/20/2012   Diabetes mellitus due to underlying condition, uncontrolled, with hyperglycemia (HCC) 09/06/2009   ERECTILE DYSFUNCTION, NON-ORGANIC 02/16/2009   Hyperlipemia 01/14/2009   HYPERTENSION 01/14/2009    PCP: Maree Leni Edyth DELENA, MD  REFERRING PROVIDER: Jerri Kay HERO, MD  REFERRING DIAG: [phone] (ICD-10-CM) - Status post total replacement of right hip   THERAPY DIAG:  Muscle weakness (generalized)  Stiffness of right hip, not elsewhere classified  Pain in right hip  Other abnormalities of gait and mobility  Unsteadiness on  feet  Rationale for Evaluation and Treatment: Rehabilitation  ONSET DATE: 01/01/2024  right THA sg  SUBJECTIVE:   SUBJECTIVE STATEMENT: Patient reports that he had one really bad pain day since last session.   PERTINENT HISTORY: right THA 01/01/24, OA, club foot deformities BLEs, CAD, Congenital Talipes Equinovarus, gout, HLD, HTN, MI, DM2  Patient underwent a right Total Hip Arthroplasty anterior approach on 01/01/2024 due to OA. HHPT until 02/26/2024. At office visit on 02/20/2024, he was still having posterior lateral hip pain.   DIAGNOSTIC FINDINGS: post-op X-rays show no abnormalities s/p THA  PAIN:  NPRS scale:  6/10 at beginning of session Pain location: right hip posterior lateral Pain description: achy Aggravating factors: sitting or laying on left side,  Relieving factors: muscle relaxer, heating pad, pain meds when really needs it  PRECAUTIONS: Anterior hip  WEIGHT BEARING RESTRICTIONS: No  FALLS:  Has patient fallen in last 6 months? No  LIVING ENVIRONMENT: Lives with: lives alone Lives in: [place] one story Stairs: Yes: External: 4 steps; can reach both Has following equipment at home: Single point cane, Environmental Consultant - 2 wheeled, Wheelchair (manual), [employer] electric, Grab bars, and Ramped entry  OCCUPATION:  retired on disability  PLOF: Independent  PATIENT GOALS:   to function with hip pain, walk well in community, clean house  Next MD visit:  03/26/2024  OBJECTIVE:   PATIENT SURVEYS:  Patient-Specific Activity Scoring Scheme  0 represents unable to perform. 10 represents able to perform at prior level. 0 1 2 3 4 5 6 7 8 9  10 (Date and Score)  Activity Eval  04/08/24   1. Walking  4   4  2. Sleeping  2   8  3.  Cleaning the house 2 2  4.    5.    Score 2.67 4.67   Total score = sum of the activity scores/number of activities Minimum detectable change (90%CI) for average score = 2 points Minimum detectable change (90%CI) for single activity score =  3 points  COGNITION: Overall cognitive status: WFL    SENSATION: WFL but has numbness in thigh ant-lat (feels touch)   EDEMA:  Patient appears to have localized edema present around the right hip especially laterally.  MUSCLE LENGTH: Hamstrings: Not formally tested but appears to be tight bilaterally Debby test: Not formally tested but appears to be tight bilaterally  POSTURE:  rounded shoulders, forward head, decreased lumbar lordosis, flexed trunk , and weight shift left  PALPATION: Scar has slight adhesion with rope feeling;  tenderness to posterior lateral hip esp over Piriformis & Glut Med  LOWER EXTREMITY ROM:   ROM Right eval Right 04/08/24  Hip flexion Standing  A: 61* Standing A: 75*  Hip extension Standing  A: 1* Standing  A: 5*  Hip abduction Standing  A: 2* Prior to leaning Standing  A: 15*  Hip adduction    Hip internal rotation  Hip external rotation    Knee flexion    Knee extension    Ankle dorsiflexion    Ankle plantarflexion    Ankle inversion    Ankle eversion     (Blank rows = not tested)  LOWER EXTREMITY MMT:  MMT Right eval Left eval  Hip flexion 3-/5 5/5  Hip extension 3-/5 5/5  Hip abduction 3-/5 5/5  Hip adduction    Hip internal rotation    Hip external rotation    Knee flexion 4/5 5/5  Knee extension 4/5 5/5  Ankle dorsiflexion    Ankle plantarflexion    Ankle inversion    Ankle eversion     (Blank rows = not tested)   FUNCTIONAL TESTS:  04/15/2024:  back movements with lateral weight shift right & left, back extension & flexion does not change right buttock pain.   Evaluation: 18 inch chair transfer: able to arise without UE assist but uses back of legs against chair to stabilize 5 times Sit to/from stand:  22.19 sec  Timed up and go with cane 12.69 seconds and without an assistive device 11.97 seconds with supervision.  Gait without a device patient had significantly increased gait dynamics indicating high fall  risk.  GAIT: Distance walked: > 100' with cane and 20' without an assistive device Assistive device utilized: Single point cane and None Level of assistance: SBA Comments: Patient has significant antalgic gait with decreased stance duration RLE, abducted gait, step to gait pattern, early lacking trailing limb hip extension.  Gait Velocity:  04/29/2024 performed with no AD and SBA  Self-selected: 3.125 ft/s Fast as comfortable: 4.21 ft/s     [facility] PT Assessment - 04/29/24 0001       Functional Gait  Assessment   Gait assessed  Yes    Gait Level Surface Walks 20 ft in less than 7 sec but greater than 5.5 sec, uses assistive device, slower speed, mild gait deviations, or deviates 6-10 in outside of the 12 in walkway width.    Change in Gait Speed Able to change speed, demonstrates mild gait deviations, deviates 6-10 in outside of the 12 in walkway width, or no gait deviations, unable to achieve a major change in velocity, or uses a change in velocity, or uses an assistive device.    Gait with Horizontal Head Turns Performs head turns smoothly with no change in gait. Deviates no more than 6 in outside 12 in walkway width    Gait with Vertical Head Turns Performs head turns with no change in gait. Deviates no more than 6 in outside 12 in walkway width.    Gait and Pivot Turn Pivot turns safely within 3 sec and stops quickly with no loss of balance.    Step Over Obstacle Is able to step over 2 stacked shoe boxes taped together (9 in total height) without changing gait speed. No evidence of imbalance.    Gait with Narrow Base of Support Ambulates less than 4 steps heel to toe or cannot perform without assistance.    Gait with Eyes Closed Walks 20 ft, no assistive devices, good speed, no evidence of imbalance, normal gait pattern, deviates no more than 6 in outside 12 in walkway width. Ambulates 20 ft in less than 7 sec.    Ambulating Backwards Walks 20 ft, no assistive devices, good speed, no  evidence for imbalance, normal gait    Steps Alternating feet, must use rail.    Total Score 24    FGA comment:  Max score 30  Interpretation: 25-28 = low risk fall   19-24 = medium risk fall  < 19 = high risk fall                                                                                                                                                                                TODAY'S TREATMENT                                                                          DATE: 05/28/2023 TherAct:  Nustep bilat LE only level 3 for 8 minutes  Seated single knee to chest 3x30s Supine TFL stretch with strap 3x30s  Supine figure 4 3x30s  PT discussed each activity performed and how they will assist with putting on socks at home   Manual:  IASTM with percussive device to Rt TFL, Rt glutes, Rt piriformis  PT discussed dry needling and provided handout to patient  Included discussion of being post-op and what a good candidate for dry needling consists of    TODAY'S TREATMENT                                                                          DATE: 05/21/2023 TherEx:  Recumbent bike level 1 for 6 minutes  PT discussed how pain can affect movement/mobility and how recovering from sickness can change functional mobility as well  Neuro Re-Ed: 1x30s each foot back semi tandem stance with one instance of UE use on // bars with Lt ft fwd; improved overall since last session  2x30s each foot back semi tandem stance with decreased space between feet to increase challenge; intermittent UE use on // bars to self correct for lateral LOB  Reciprocal step taps to cone 2x20 taps; unilat UE use on // bars for first round, intermittent UE use on // bars during second round with increased challenge  Step over and back with the small hurdle 2x10 stepping with each LE; intermittent UE use for lateral LOB and few instances of catching foot on hurdle and requiring bilat UE use and stepping reaction for  balance  Lateral walks on foam  with stepping off and on for change in surface 1x3 down and back with several instances of lateral and fwd/back LOB requiring UE use on // bars  PT discussed how compliant surfaces are more challenging than firm surfaces    TODAY'S TREATMENT                                                                          DATE: 04/29/2024 Physical Performance  FGA with close SBA  Results noted above and discussed with patient  Gait speed  Results noted above and discussed with patient   Neuro Re-Ed: Patient attempted tandem stance within parallel bars, though unable to perform appropriately without UE support  Semi-tandem stance 2x30s each side with only one instance of lateral LOB with Lt LE back; able to self-correct with UE use  PT discussed performing semi-tandem stance at home within a corner and a chair placed in front for maximum safety  PT discussed when to progress and how to progress  Reciprocal step taps to cone 1x20 taps with fingertip use on parallel bars for most of repetitions   Self-Care:  PT discussed POC with patient and importance of compliance with HEP in order to reach maximal benefits and safety when transitioning out of PT    TODAY'S TREATMENT                                                                          DATE: 04/22/2024 Therapeutic Exercise:  Recumbent bike seat 7 level 4 for 6 minutes   Hip flexor stretch standing with foot on 18 chair against wall 15 sec hold 2 reps 3 sets (moving stance foot 1 closer to chair)  with BLEs Knee to chest stretch 15 sec hold with ea LE then extending contralateral LE for gentle ext stretch 3 reps ea LE RLE Seated hip rotation internal & external 10 reps 2 sets ea motion.    Self-care: PT demo & verbal cue on donning socks & shoes using foot stool. Pt verbalized understanding.  Pt return demo first with LLE then RLE.        HOME EXERCISE PROGRAM: Access Code: [email or web address] URL:  [email or web address] Date: 03/13/2024 Prepared by: Grayce Spatz  Exercises - Standing Hip Abduction: Alternating With Resistance Loop  - 1 x daily - 7 x weekly - 2 sets - 10 reps - 5 seconds hold - Hip Extension with Resistance Loop  - 1 x daily - 7 x weekly - 2 sets - 10 reps - 5 seconds hold - Standing Hip Flexion with Resistance Loop  - 1 x daily - 7 x weekly - 2 sets - 10 reps - 5 seconds hold - Standing Marching  - 1 x daily - 7 x weekly - 2 sets - 10 reps - 5 seconds hold - Sit to Stand with Armchair  - 1 x daily - 7 x weekly - 3 sets - 10 reps - Gastroc Stretch on Step  -  1-2 x daily - 7 x weekly - 1 sets - 3 reps - 30 seconds hold - Standing Hamstring Stretch with Step  - 1-2 x daily - 7 x weekly - 1 sets - 3 reps - 30 seconds hold - Seated Piriformis Stretch  - 1-2 x daily - 7 x weekly - 1 sets - 3 reps - 30 seconds hold - Tandem Walking with Counter Support  - 1 x daily - 7 x weekly - 3 sets - 10 reps - Carioca with Counter Support  - 1 x daily - 3 x weekly - 3 sets - 10 reps - [employer] on Counter  - 1 x daily - 7 x weekly - 12 sets - 10 reps  ASSESSMENT:  CLINICAL IMPRESSION:  Patient arrived to session noting intermittent high increases in Rt glute/posterior hip pain with an average pain level around 6/10. Patient tolerated IASTM this date and performed supine stretches that may assist with pain and putting on socks at home.  Patient will continue to benefit from skilled PT.   OBJECTIVE IMPAIRMENTS: Abnormal gait, decreased activity tolerance, decreased balance, decreased endurance, decreased knowledge of condition, decreased knowledge of use of DME, decreased mobility, difficulty walking, decreased ROM, decreased strength, increased edema, increased muscle spasms, impaired flexibility, postural dysfunction, obesity, and pain.   ACTIVITY LIMITATIONS: carrying, lifting, bending, sitting, standing, squatting, sleeping, stairs, transfers, bed mobility, and  locomotion level  PARTICIPATION LIMITATIONS: meal prep, cleaning, laundry, shopping, and community activity  PERSONAL FACTORS: Age, Fitness, Past/current experiences, Time since onset of injury/illness/exacerbation, and 3+ comorbidities: see PMH are also affecting patient's functional outcome.   REHAB POTENTIAL: Good  CLINICAL DECISION MAKING: Stable/uncomplicated  EVALUATION COMPLEXITY: Low   GOALS: Goals reviewed with patient? Yes  SHORT TERM GOALS: (target date for Short term goals 03/28/2024)   1.  Patient will demonstrate independent use of home exercise program to maintain progress from in clinic treatments. Goal status:  MET    04/08/2024  2.  Patient reports right hip pain <5/10 with activities including sleeping Goal status:  partially MET     04/08/2024  LONG TERM GOALS: 06/24/2024   1. Patient will demonstrate/report pain at worst less than or equal to 2/10 to facilitate minimal limitation in daily activity secondary to pain symptoms. Goal status: Ongoing       04/22/2024   2. Patient will demonstrate independent use of home exercise program to facilitate ability to maintain/progress functional gains from skilled physical therapy services. Goal status: Ongoing        04/22/2024   3. Patient will demonstrate Patient specific functional scale avg > or = 6 to indicate reduced disability due to condition.  Goal status: Ongoing      04/22/2024   4.  Patient will demonstrate right hip LE MMT 5/5 throughout to faciltiate usual transfers, stairs, squatting at [facility] for daily life.  Goal status: Ongoing    04/22/2024   5.  Patient ambulates >500' and negotiate ramps and curbs without an assistive device independently. Goal status: Ongoing      04/22/2024   6.  Patient negotiates stairs with single rail alternating pattern modified independent. Goal status:   Ongoing   04/22/2024    PLAN:  PT FREQUENCY: 1x/week  PT DURATION: 8 weeks  PLANNED INTERVENTIONS: [id number]- PT  Re-evaluation, 97750- Physical Performance Testing, 97110-Therapeutic exercises, 97530- Therapeutic activity, [id number]- Neuromuscular re-education, 97535- Self Care, 02859- Manual therapy, [id number]- Gait training, [id number]- Electrical stimulation (manual), [id number]- Ultrasound, 02966- Ionotophoresis 4mg /ml Dexamethasone ,  79439 (1-2 muscles), 20561 (3+ muscles)- Dry Needling, Patient/Family education, Balance training, Stair training, Taping, Scar mobilization, DME instructions, Cryotherapy, and Moist heat  PLAN FOR NEXT SESSION:  static and dynamic balance (especially with narrow BOS), continued strengthening     Susannah Daring, PT, DPT 05/27/24 8:54 AM      "

## 2024-05-27 ENCOUNTER — Ambulatory Visit: Payer: Medicare (Managed Care)

## 2024-05-27 DIAGNOSIS — R2689 Other abnormalities of gait and mobility: Secondary | ICD-10-CM | POA: Diagnosis not present

## 2024-05-27 DIAGNOSIS — M25551 Pain in right hip: Secondary | ICD-10-CM

## 2024-05-27 DIAGNOSIS — M6281 Muscle weakness (generalized): Secondary | ICD-10-CM

## 2024-05-27 DIAGNOSIS — M25651 Stiffness of right hip, not elsewhere classified: Secondary | ICD-10-CM | POA: Diagnosis not present

## 2024-05-27 DIAGNOSIS — R2681 Unsteadiness on feet: Secondary | ICD-10-CM | POA: Diagnosis not present

## 2024-06-26 ENCOUNTER — Ambulatory Visit: Payer: Medicare (Managed Care) | Admitting: Physician Assistant
